# Patient Record
Sex: Female | Born: 1945 | Race: Black or African American | Hispanic: No | Smoking: Current every day smoker
Health system: Southern US, Community
[De-identification: ages and names within clinical notes are randomized; demographics above are authoritative.]

## PROBLEM LIST (undated history)

## (undated) DIAGNOSIS — E785 Hyperlipidemia, unspecified: Secondary | ICD-10-CM

## (undated) DIAGNOSIS — E669 Obesity, unspecified: Secondary | ICD-10-CM

## (undated) DIAGNOSIS — R0600 Dyspnea, unspecified: Secondary | ICD-10-CM

## (undated) DIAGNOSIS — Z91199 Patient's noncompliance with other medical treatment and regimen due to unspecified reason: Secondary | ICD-10-CM

## (undated) DIAGNOSIS — I509 Heart failure, unspecified: Secondary | ICD-10-CM

## (undated) DIAGNOSIS — J9610 Chronic respiratory failure, unspecified whether with hypoxia or hypercapnia: Secondary | ICD-10-CM

## (undated) DIAGNOSIS — Z8541 Personal history of malignant neoplasm of cervix uteri: Secondary | ICD-10-CM

## (undated) DIAGNOSIS — M7989 Other specified soft tissue disorders: Secondary | ICD-10-CM

## (undated) DIAGNOSIS — I10 Essential (primary) hypertension: Secondary | ICD-10-CM

## (undated) DIAGNOSIS — Z9119 Patient's noncompliance with other medical treatment and regimen: Secondary | ICD-10-CM

## (undated) DIAGNOSIS — J9859 Other diseases of mediastinum, not elsewhere classified: Secondary | ICD-10-CM

## (undated) DIAGNOSIS — J449 Chronic obstructive pulmonary disease, unspecified: Secondary | ICD-10-CM

## (undated) DIAGNOSIS — J302 Other seasonal allergic rhinitis: Secondary | ICD-10-CM

## (undated) DIAGNOSIS — F411 Generalized anxiety disorder: Secondary | ICD-10-CM

## (undated) DIAGNOSIS — Z720 Tobacco use: Secondary | ICD-10-CM

## (undated) DIAGNOSIS — Z789 Other specified health status: Secondary | ICD-10-CM

## (undated) DIAGNOSIS — I5032 Chronic diastolic (congestive) heart failure: Secondary | ICD-10-CM

## (undated) DIAGNOSIS — B019 Varicella without complication: Secondary | ICD-10-CM

## (undated) DIAGNOSIS — I499 Cardiac arrhythmia, unspecified: Secondary | ICD-10-CM

## (undated) DIAGNOSIS — I059 Rheumatic mitral valve disease, unspecified: Secondary | ICD-10-CM

## (undated) DIAGNOSIS — R911 Solitary pulmonary nodule: Secondary | ICD-10-CM

## (undated) HISTORY — DX: Other seasonal allergic rhinitis: J30.2

## (undated) HISTORY — DX: Chronic obstructive pulmonary disease, unspecified: J44.9

## (undated) HISTORY — DX: Solitary pulmonary nodule: R91.1

## (undated) HISTORY — DX: Chronic respiratory failure, unspecified whether with hypoxia or hypercapnia: J96.10

## (undated) HISTORY — DX: Obesity, unspecified: E66.9

## (undated) HISTORY — DX: Generalized anxiety disorder: F41.1

## (undated) HISTORY — DX: Chronic diastolic (congestive) heart failure: I50.32

## (undated) HISTORY — DX: Patient's noncompliance with other medical treatment and regimen: Z91.19

## (undated) HISTORY — PX: TOTAL ABDOMINAL HYSTERECTOMY: SHX209

## (undated) HISTORY — DX: Varicella without complication: B01.9

## (undated) HISTORY — DX: Personal history of malignant neoplasm of cervix uteri: Z85.41

## (undated) HISTORY — DX: Other specified soft tissue disorders: M79.89

## (undated) HISTORY — DX: Tobacco use: Z72.0

## (undated) HISTORY — DX: Rheumatic mitral valve disease, unspecified: I05.9

## (undated) HISTORY — DX: Essential (primary) hypertension: I10

## (undated) HISTORY — DX: Other diseases of mediastinum, not elsewhere classified: J98.59

## (undated) HISTORY — DX: Patient's noncompliance with other medical treatment and regimen due to unspecified reason: Z91.199

---

## 1980-01-22 DIAGNOSIS — Z8541 Personal history of malignant neoplasm of cervix uteri: Secondary | ICD-10-CM

## 1980-01-22 HISTORY — DX: Personal history of malignant neoplasm of cervix uteri: Z85.41

## 2006-03-06 ENCOUNTER — Ambulatory Visit: Payer: Self-pay | Admitting: Internal Medicine

## 2006-03-26 ENCOUNTER — Ambulatory Visit: Payer: Self-pay

## 2006-03-26 ENCOUNTER — Encounter: Payer: Self-pay | Admitting: Cardiology

## 2006-03-26 ENCOUNTER — Ambulatory Visit: Payer: Self-pay | Admitting: Critical Care Medicine

## 2006-04-09 ENCOUNTER — Ambulatory Visit: Payer: Self-pay | Admitting: Internal Medicine

## 2006-04-09 LAB — CONVERTED CEMR LAB
Albumin: 3.8 g/dL (ref 3.5–5.2)
Basophils Absolute: 0 10*3/uL (ref 0.0–0.1)
Basophils Relative: 0.9 % (ref 0.0–1.0)
Bilirubin Urine: NEGATIVE
CO2: 30 meq/L (ref 19–32)
Cholesterol: 177 mg/dL (ref 0–200)
Creatinine, Ser: 0.8 mg/dL (ref 0.4–1.2)
Crystals: NEGATIVE
Eosinophils Absolute: 0.1 10*3/uL (ref 0.0–0.6)
GFR calc Af Amer: 95 mL/min
HDL: 32.9 mg/dL — ABNORMAL LOW (ref >39.0)
Hemoglobin: 16.1 g/dL — ABNORMAL HIGH (ref 12.0–15.0)
Ketones, ur: NEGATIVE mg/dL
Leukocytes, UA: NEGATIVE
Lymphocytes Relative: 58.4 % — ABNORMAL HIGH (ref 12.0–46.0)
MCHC: 33.4 g/dL (ref 30.0–36.0)
MCV: 87.7 fL (ref 78.0–100.0)
Monocytes Absolute: 0.8 10*3/uL — ABNORMAL HIGH (ref 0.2–0.7)
Monocytes Relative: 18.7 % — ABNORMAL HIGH (ref 3.0–11.0)
Neutro Abs: 0.9 10*3/uL — ABNORMAL LOW (ref 1.4–7.7)
Nitrite: NEGATIVE
Platelets: 258 10*3/uL (ref 150–400)
Potassium: 4.2 meq/L (ref 3.5–5.1)
RBC / HPF: NONE SEEN
Sodium: 144 meq/L (ref 135–145)
Specific Gravity, Urine: 1.01 (ref 1.000–1.03)
Triglycerides: 189 mg/dL — ABNORMAL HIGH (ref 0–149)
Urine Glucose: NEGATIVE mg/dL
Urobilinogen, UA: 0.2 (ref 0.0–1.0)
VLDL: 38 mg/dL (ref 0–40)

## 2006-04-10 ENCOUNTER — Ambulatory Visit: Payer: Self-pay | Admitting: Internal Medicine

## 2006-05-19 ENCOUNTER — Ambulatory Visit: Payer: Self-pay | Admitting: Internal Medicine

## 2006-06-27 ENCOUNTER — Ambulatory Visit: Payer: Self-pay | Admitting: Internal Medicine

## 2006-07-11 ENCOUNTER — Ambulatory Visit: Payer: Self-pay | Admitting: Internal Medicine

## 2006-08-01 ENCOUNTER — Ambulatory Visit: Payer: Self-pay | Admitting: Internal Medicine

## 2006-08-01 LAB — CONVERTED CEMR LAB
BUN: 7 mg/dL (ref 6–23)
CO2: 27 meq/L (ref 19–32)
Calcium: 9.6 mg/dL (ref 8.4–10.5)
Creatinine, Ser: 0.9 mg/dL (ref 0.4–1.2)
Glucose, Bld: 86 mg/dL (ref 70–99)

## 2007-02-20 ENCOUNTER — Ambulatory Visit: Payer: Self-pay | Admitting: Internal Medicine

## 2007-02-20 DIAGNOSIS — Z8541 Personal history of malignant neoplasm of cervix uteri: Secondary | ICD-10-CM

## 2007-02-20 DIAGNOSIS — F419 Anxiety disorder, unspecified: Secondary | ICD-10-CM | POA: Insufficient documentation

## 2007-02-20 DIAGNOSIS — I059 Rheumatic mitral valve disease, unspecified: Secondary | ICD-10-CM | POA: Insufficient documentation

## 2007-03-06 ENCOUNTER — Ambulatory Visit: Payer: Self-pay | Admitting: Internal Medicine

## 2007-03-06 DIAGNOSIS — I10 Essential (primary) hypertension: Secondary | ICD-10-CM

## 2007-04-17 ENCOUNTER — Telehealth (INDEPENDENT_AMBULATORY_CARE_PROVIDER_SITE_OTHER): Payer: Self-pay | Admitting: *Deleted

## 2007-05-14 ENCOUNTER — Telehealth: Payer: Self-pay | Admitting: Internal Medicine

## 2007-05-18 ENCOUNTER — Ambulatory Visit: Payer: Self-pay | Admitting: Internal Medicine

## 2007-05-18 LAB — CONVERTED CEMR LAB

## 2007-05-19 ENCOUNTER — Encounter: Payer: Self-pay | Admitting: Internal Medicine

## 2008-06-08 ENCOUNTER — Telehealth: Payer: Self-pay | Admitting: Internal Medicine

## 2008-06-10 ENCOUNTER — Ambulatory Visit: Payer: Self-pay | Admitting: Internal Medicine

## 2008-06-10 LAB — CONVERTED CEMR LAB
ALT: 16 units/L (ref 0–35)
AST: 15 units/L (ref 0–37)
Alkaline Phosphatase: 78 units/L (ref 39–117)
Basophils Absolute: 0 10*3/uL (ref 0.0–0.1)
Bilirubin, Direct: 0.1 mg/dL (ref 0.0–0.3)
CO2: 31 meq/L (ref 19–32)
Chloride: 111 meq/L (ref 96–112)
Eosinophils Relative: 2 % (ref 0.0–5.0)
HDL: 32 mg/dL — ABNORMAL LOW (ref >39.00)
Hemoglobin: 16.5 g/dL — ABNORMAL HIGH (ref 12.0–15.0)
Lymphocytes Relative: 45.6 % (ref 12.0–46.0)
Monocytes Relative: 8.7 % (ref 3.0–12.0)
Neutro Abs: 2.4 10*3/uL (ref 1.4–7.7)
RBC: 5.42 M/uL — ABNORMAL HIGH (ref 3.87–5.11)
RDW: 13.4 % (ref 11.5–14.6)
Sodium: 143 meq/L (ref 135–145)
TSH: 2.46 microintl units/mL (ref 0.35–5.50)
Total CHOL/HDL Ratio: 6
Total Protein: 7.2 g/dL (ref 6.0–8.3)
WBC: 5.5 10*3/uL (ref 4.5–10.5)

## 2008-06-13 ENCOUNTER — Ambulatory Visit: Payer: Self-pay | Admitting: Internal Medicine

## 2008-06-13 DIAGNOSIS — E785 Hyperlipidemia, unspecified: Secondary | ICD-10-CM

## 2008-06-22 ENCOUNTER — Ambulatory Visit: Payer: Self-pay | Admitting: Internal Medicine

## 2008-08-24 ENCOUNTER — Telehealth: Payer: Self-pay | Admitting: Internal Medicine

## 2009-01-08 ENCOUNTER — Emergency Department (HOSPITAL_COMMUNITY): Admission: EM | Admit: 2009-01-08 | Discharge: 2009-01-08 | Payer: Self-pay | Admitting: Emergency Medicine

## 2010-03-22 DIAGNOSIS — J479 Bronchiectasis, uncomplicated: Secondary | ICD-10-CM | POA: Insufficient documentation

## 2010-03-22 DIAGNOSIS — R911 Solitary pulmonary nodule: Secondary | ICD-10-CM | POA: Insufficient documentation

## 2010-03-22 DIAGNOSIS — J9859 Other diseases of mediastinum, not elsewhere classified: Secondary | ICD-10-CM

## 2010-03-22 HISTORY — DX: Other diseases of mediastinum, not elsewhere classified: J98.59

## 2010-03-22 HISTORY — DX: Solitary pulmonary nodule: R91.1

## 2010-04-04 ENCOUNTER — Inpatient Hospital Stay (HOSPITAL_COMMUNITY)
Admission: EM | Admit: 2010-04-04 | Discharge: 2010-04-11 | DRG: 189 | Disposition: A | Discharge status: Home Health | Payer: Medicaid Other | Attending: Internal Medicine | Admitting: Internal Medicine

## 2010-04-04 ENCOUNTER — Emergency Department (HOSPITAL_COMMUNITY): Payer: Medicaid Other

## 2010-04-04 DIAGNOSIS — I5032 Chronic diastolic (congestive) heart failure: Secondary | ICD-10-CM | POA: Diagnosis present

## 2010-04-04 DIAGNOSIS — J9620 Acute and chronic respiratory failure, unspecified whether with hypoxia or hypercapnia: Principal | ICD-10-CM | POA: Diagnosis present

## 2010-04-04 DIAGNOSIS — E669 Obesity, unspecified: Secondary | ICD-10-CM | POA: Diagnosis present

## 2010-04-04 DIAGNOSIS — Z9119 Patient's noncompliance with other medical treatment and regimen: Secondary | ICD-10-CM

## 2010-04-04 DIAGNOSIS — Z91199 Patient's noncompliance with other medical treatment and regimen due to unspecified reason: Secondary | ICD-10-CM

## 2010-04-04 DIAGNOSIS — Z23 Encounter for immunization: Secondary | ICD-10-CM

## 2010-04-04 DIAGNOSIS — I498 Other specified cardiac arrhythmias: Secondary | ICD-10-CM | POA: Diagnosis present

## 2010-04-04 DIAGNOSIS — G4733 Obstructive sleep apnea (adult) (pediatric): Secondary | ICD-10-CM | POA: Diagnosis present

## 2010-04-04 DIAGNOSIS — T380X5A Adverse effect of glucocorticoids and synthetic analogues, initial encounter: Secondary | ICD-10-CM | POA: Diagnosis not present

## 2010-04-04 DIAGNOSIS — J440 Chronic obstructive pulmonary disease with acute lower respiratory infection: Secondary | ICD-10-CM | POA: Diagnosis present

## 2010-04-04 DIAGNOSIS — I10 Essential (primary) hypertension: Secondary | ICD-10-CM | POA: Diagnosis present

## 2010-04-04 DIAGNOSIS — I509 Heart failure, unspecified: Secondary | ICD-10-CM | POA: Diagnosis present

## 2010-04-04 DIAGNOSIS — J209 Acute bronchitis, unspecified: Secondary | ICD-10-CM | POA: Diagnosis present

## 2010-04-04 DIAGNOSIS — R7309 Other abnormal glucose: Secondary | ICD-10-CM | POA: Diagnosis present

## 2010-04-04 DIAGNOSIS — F17200 Nicotine dependence, unspecified, uncomplicated: Secondary | ICD-10-CM | POA: Diagnosis present

## 2010-04-04 DIAGNOSIS — R918 Other nonspecific abnormal finding of lung field: Secondary | ICD-10-CM | POA: Diagnosis present

## 2010-04-04 LAB — COMPREHENSIVE METABOLIC PANEL
Alkaline Phosphatase: 90 U/L (ref 39–117)
BUN: 13 mg/dL (ref 6–23)
CO2: 26 mEq/L (ref 19–32)
Chloride: 105 mEq/L (ref 96–112)
Glucose, Bld: 137 mg/dL — ABNORMAL HIGH (ref 70–99)
Potassium: 3.4 mEq/L — ABNORMAL LOW (ref 3.5–5.1)
Total Bilirubin: 0.7 mg/dL (ref 0.3–1.2)

## 2010-04-04 LAB — CBC
HCT: 54.2 % — ABNORMAL HIGH (ref 36.0–46.0)
MCV: 90.9 fL (ref 78.0–100.0)
RBC: 5.96 MIL/uL — ABNORMAL HIGH (ref 3.87–5.11)
WBC: 8.2 10*3/uL (ref 4.0–10.5)

## 2010-04-04 LAB — POCT I-STAT 3, ART BLOOD GAS (G3+)
O2 Saturation: 94 %
pCO2 arterial: 46 mmHg — ABNORMAL HIGH (ref 35.0–45.0)
pO2, Arterial: 77 mmHg — ABNORMAL LOW (ref 80.0–100.0)

## 2010-04-04 LAB — DIFFERENTIAL
Eosinophils Relative: 1 % (ref 0–5)
Lymphocytes Relative: 23 % (ref 12–46)
Lymphs Abs: 1.9 10*3/uL (ref 0.7–4.0)
Neutrophils Relative %: 67 % (ref 43–77)

## 2010-04-04 LAB — BRAIN NATRIURETIC PEPTIDE: Pro B Natriuretic peptide (BNP): <30 pg/mL (ref 0.0–100.0)

## 2010-04-04 LAB — POCT CARDIAC MARKERS

## 2010-04-05 LAB — TSH: TSH: 1.179 u[IU]/mL (ref 0.350–4.500)

## 2010-04-05 LAB — COMPREHENSIVE METABOLIC PANEL
ALT: 23 U/L (ref 0–35)
BUN: 14 mg/dL (ref 6–23)
Calcium: 9.4 mg/dL (ref 8.4–10.5)
Creatinine, Ser: 1.29 mg/dL — ABNORMAL HIGH (ref 0.4–1.2)
GFR calc non Af Amer: 42 mL/min — ABNORMAL LOW (ref >60)
Glucose, Bld: 141 mg/dL — ABNORMAL HIGH (ref 70–99)
Sodium: 142 mEq/L (ref 135–145)
Total Protein: 7.2 g/dL (ref 6.0–8.3)

## 2010-04-05 LAB — URINALYSIS, ROUTINE W REFLEX MICROSCOPIC
Bilirubin Urine: NEGATIVE
Glucose, UA: NEGATIVE mg/dL
Ketones, ur: NEGATIVE mg/dL
Leukocytes, UA: NEGATIVE
pH: 6 (ref 5.0–8.0)

## 2010-04-05 LAB — GLUCOSE, CAPILLARY: Glucose-Capillary: 195 mg/dL — ABNORMAL HIGH (ref 70–99)

## 2010-04-05 LAB — CARDIAC PANEL(CRET KIN+CKTOT+MB+TROPI)
CK, MB: 8.6 ng/mL (ref 0.3–4.0)
CK, MB: 11.2 ng/mL (ref 0.3–4.0)
Relative Index: 2 (ref 0.0–2.5)
Relative Index: 3.3 — ABNORMAL HIGH (ref 0.0–2.5)
Troponin I: 0.02 ng/mL (ref 0.00–0.06)
Troponin I: 0.02 ng/mL (ref 0.00–0.06)

## 2010-04-05 LAB — CBC
MCHC: 33.3 g/dL (ref 30.0–36.0)
MCV: 91 fL (ref 78.0–100.0)
Platelets: 250 10*3/uL (ref 150–400)
RDW: 14.5 % (ref 11.5–15.5)
WBC: 8.5 10*3/uL (ref 4.0–10.5)

## 2010-04-05 LAB — URINE MICROSCOPIC-ADD ON

## 2010-04-05 LAB — LIPID PANEL
Cholesterol: 170 mg/dL (ref 0–200)
HDL: 38 mg/dL — ABNORMAL LOW (ref >39)
LDL Cholesterol: 121 mg/dL — ABNORMAL HIGH (ref 0–99)
Total CHOL/HDL Ratio: 4.5 RATIO
Triglycerides: 55 mg/dL (ref <150)

## 2010-04-05 LAB — BLOOD GAS, ARTERIAL
Acid-Base Excess: 1.6 mmol/L (ref 0.0–2.0)
Bicarbonate: 26.4 mEq/L — ABNORMAL HIGH (ref 20.0–24.0)
Drawn by: 307971
O2 Content: 5 L/min
O2 Saturation: 91.3 %
Patient temperature: 98.6
pO2, Arterial: 62.2 mmHg — ABNORMAL LOW (ref 80.0–100.0)

## 2010-04-05 MED ORDER — IOHEXOL 300 MG/ML  SOLN
100.0000 mL | Freq: Once | INTRAMUSCULAR | Status: AC | PRN
  Administered 2010-04-05: 100 mL via INTRAVENOUS

## 2010-04-06 DIAGNOSIS — R0609 Other forms of dyspnea: Secondary | ICD-10-CM

## 2010-04-06 DIAGNOSIS — R0989 Other specified symptoms and signs involving the circulatory and respiratory systems: Secondary | ICD-10-CM

## 2010-04-06 LAB — CBC
HCT: 52.4 % — ABNORMAL HIGH (ref 36.0–46.0)
Hemoglobin: 17.3 g/dL — ABNORMAL HIGH (ref 12.0–15.0)
MCH: 29.9 pg (ref 26.0–34.0)
MCHC: 33 g/dL (ref 30.0–36.0)
MCV: 90.5 fL (ref 78.0–100.0)
RBC: 5.79 MIL/uL — ABNORMAL HIGH (ref 3.87–5.11)

## 2010-04-06 LAB — DIFFERENTIAL
Lymphs Abs: 0.8 10*3/uL (ref 0.7–4.0)
Monocytes Absolute: 0.8 10*3/uL (ref 0.1–1.0)
Monocytes Relative: 6 % (ref 3–12)
Neutro Abs: 11.9 10*3/uL — ABNORMAL HIGH (ref 1.7–7.7)
Neutrophils Relative %: 88 % — ABNORMAL HIGH (ref 43–77)

## 2010-04-06 LAB — CARDIAC PANEL(CRET KIN+CKTOT+MB+TROPI)
CK, MB: 11.3 ng/mL (ref 0.3–4.0)
Total CK: 376 U/L — ABNORMAL HIGH (ref 7–177)
Troponin I: 0.03 ng/mL (ref 0.00–0.06)

## 2010-04-06 LAB — COMPREHENSIVE METABOLIC PANEL
AST: 25 U/L (ref 0–37)
BUN: 22 mg/dL (ref 6–23)
CO2: 24 mEq/L (ref 19–32)
Chloride: 104 mEq/L (ref 96–112)
Creatinine, Ser: 1.2 mg/dL (ref 0.4–1.2)
GFR calc non Af Amer: 46 mL/min — ABNORMAL LOW (ref >60)
Glucose, Bld: 156 mg/dL — ABNORMAL HIGH (ref 70–99)
Total Bilirubin: 0.4 mg/dL (ref 0.3–1.2)

## 2010-04-06 LAB — GLUCOSE, CAPILLARY: Glucose-Capillary: 155 mg/dL — ABNORMAL HIGH (ref 70–99)

## 2010-04-07 LAB — COMPREHENSIVE METABOLIC PANEL
Alkaline Phosphatase: 73 U/L (ref 39–117)
BUN: 24 mg/dL — ABNORMAL HIGH (ref 6–23)
Chloride: 104 mEq/L (ref 96–112)
Glucose, Bld: 91 mg/dL (ref 70–99)
Potassium: 4 mEq/L (ref 3.5–5.1)
Total Bilirubin: 0.7 mg/dL (ref 0.3–1.2)

## 2010-04-07 LAB — GLUCOSE, CAPILLARY: Glucose-Capillary: 126 mg/dL — ABNORMAL HIGH (ref 70–99)

## 2010-04-07 LAB — CBC
HCT: 54.9 % — ABNORMAL HIGH (ref 36.0–46.0)
MCV: 92.3 fL (ref 78.0–100.0)
RDW: 14.7 % (ref 11.5–15.5)
WBC: 13.8 10*3/uL — ABNORMAL HIGH (ref 4.0–10.5)

## 2010-04-07 LAB — DIFFERENTIAL
Eosinophils Relative: 0 % (ref 0–5)
Lymphocytes Relative: 15 % (ref 12–46)
Lymphs Abs: 2.1 10*3/uL (ref 0.7–4.0)
Monocytes Relative: 13 % — ABNORMAL HIGH (ref 3–12)

## 2010-04-08 LAB — COMPREHENSIVE METABOLIC PANEL
Albumin: 3.7 g/dL (ref 3.5–5.2)
Alkaline Phosphatase: 74 U/L (ref 39–117)
BUN: 22 mg/dL (ref 6–23)
Chloride: 105 mEq/L (ref 96–112)
Glucose, Bld: 153 mg/dL — ABNORMAL HIGH (ref 70–99)
Potassium: 4.1 mEq/L (ref 3.5–5.1)
Total Bilirubin: 0.3 mg/dL (ref 0.3–1.2)

## 2010-04-08 LAB — GLUCOSE, CAPILLARY
Glucose-Capillary: 160 mg/dL — ABNORMAL HIGH (ref 70–99)
Glucose-Capillary: 159 mg/dL — ABNORMAL HIGH (ref 70–99)

## 2010-04-08 LAB — CBC
HCT: 55.2 % — ABNORMAL HIGH (ref 36.0–46.0)
MCV: 93.2 fL (ref 78.0–100.0)
RBC: 5.92 MIL/uL — ABNORMAL HIGH (ref 3.87–5.11)
WBC: 11.3 10*3/uL — ABNORMAL HIGH (ref 4.0–10.5)

## 2010-04-09 ENCOUNTER — Inpatient Hospital Stay (HOSPITAL_COMMUNITY): Payer: Medicaid Other

## 2010-04-09 DIAGNOSIS — R911 Solitary pulmonary nodule: Secondary | ICD-10-CM

## 2010-04-09 DIAGNOSIS — R0902 Hypoxemia: Secondary | ICD-10-CM

## 2010-04-09 DIAGNOSIS — J479 Bronchiectasis, uncomplicated: Secondary | ICD-10-CM

## 2010-04-09 DIAGNOSIS — J441 Chronic obstructive pulmonary disease with (acute) exacerbation: Secondary | ICD-10-CM

## 2010-04-09 LAB — CBC
HCT: 56.4 % — ABNORMAL HIGH (ref 36.0–46.0)
Platelets: 286 10*3/uL (ref 150–400)
RBC: 6.05 MIL/uL — ABNORMAL HIGH (ref 3.87–5.11)
RDW: 14.5 % (ref 11.5–15.5)
WBC: 10.9 10*3/uL — ABNORMAL HIGH (ref 4.0–10.5)

## 2010-04-09 LAB — BLOOD GAS, ARTERIAL
Bicarbonate: 30.2 mEq/L — ABNORMAL HIGH (ref 20.0–24.0)
O2 Saturation: 88.7 %
Patient temperature: 98.6
TCO2: 31.7 mmol/L (ref 0–100)

## 2010-04-09 LAB — DIFFERENTIAL
Basophils Absolute: 0 10*3/uL (ref 0.0–0.1)
Eosinophils Absolute: 0 10*3/uL (ref 0.0–0.7)
Eosinophils Relative: 0 % (ref 0–5)
Lymphocytes Relative: 8 % — ABNORMAL LOW (ref 12–46)
Lymphs Abs: 0.9 10*3/uL (ref 0.7–4.0)
Neutrophils Relative %: 87 % — ABNORMAL HIGH (ref 43–77)

## 2010-04-09 LAB — COMPREHENSIVE METABOLIC PANEL
AST: 16 U/L (ref 0–37)
CO2: 32 mEq/L (ref 19–32)
Calcium: 9.1 mg/dL (ref 8.4–10.5)
Chloride: 105 mEq/L (ref 96–112)
Creatinine, Ser: 0.93 mg/dL (ref 0.4–1.2)
GFR calc Af Amer: >60 mL/min (ref >60)
GFR calc non Af Amer: >60 mL/min (ref >60)
Glucose, Bld: 144 mg/dL — ABNORMAL HIGH (ref 70–99)
Total Bilirubin: 0.4 mg/dL (ref 0.3–1.2)

## 2010-04-09 LAB — GLUCOSE, CAPILLARY: Glucose-Capillary: 166 mg/dL — ABNORMAL HIGH (ref 70–99)

## 2010-04-10 ENCOUNTER — Inpatient Hospital Stay (HOSPITAL_COMMUNITY): Payer: Medicaid Other

## 2010-04-10 LAB — COMPREHENSIVE METABOLIC PANEL
ALT: 24 U/L (ref 0–35)
AST: 21 U/L (ref 0–37)
Alkaline Phosphatase: 62 U/L (ref 39–117)
CO2: 31 mEq/L (ref 19–32)
Calcium: 8.9 mg/dL (ref 8.4–10.5)
Chloride: 101 mEq/L (ref 96–112)
GFR calc Af Amer: >60 mL/min (ref >60)
GFR calc non Af Amer: >60 mL/min (ref >60)
Potassium: 4.6 mEq/L (ref 3.5–5.1)
Sodium: 138 mEq/L (ref 135–145)

## 2010-04-10 LAB — CBC
Hemoglobin: 18.4 g/dL — ABNORMAL HIGH (ref 12.0–15.0)
MCHC: 33.2 g/dL (ref 30.0–36.0)
RBC: 6.03 MIL/uL — ABNORMAL HIGH (ref 3.87–5.11)
WBC: 12.8 10*3/uL — ABNORMAL HIGH (ref 4.0–10.5)

## 2010-04-10 LAB — GLUCOSE, CAPILLARY: Glucose-Capillary: 158 mg/dL — ABNORMAL HIGH (ref 70–99)

## 2010-04-10 NOTE — Progress Notes (Signed)
[name], [name]                ACCOUNT NO.:  [id number]  MEDICAL RECORD NO.:  [id number]           PATIENT TYPE:  I  LOCATION:  4741                         FACILITY:  [facility]  PHYSICIAN:  Pincus Large, MD     DATE OF BIRTH:  10/07/1945                                PROGRESS NOTE   PRIMARY CARE PHYSICIAN: Unassigned, he used to see Dr. Artist Pais.  ACTIVE PROBLEMS DURING HOSPITAL: 1. Chronic obstructive pulmonary disease exacerbation requiring 4     liters of oxygen. 2. Mild bronchiectasis and scarring in the right upper lung. 3. Three small pulmonary nodules within the right upper and the lower     lung bases measuring up to 5 mm in size. 4. History of tobacco abuse. 5. Grade 1 diastolic dysfunction.  IMAGES THAT WERE DONE: 1. Chest x-ray was done on April 04, 2010, which shows mild pulmonary     vascular congestion superimposed with chronic interstitial     coarsening, no focal airspace disease. 2. CT angio was done on April 05, 2010, which shows no definite     evidence of significant pulmonary embolism, minimal bibasilar     atelectasis, emphysema within the upper lung, mild bronchiectasis     and scarring of the right upper lung lobes.  There is a single     enlarged 1.3-cm on the right peribronchial node, no significant     mediastinal lymphadenopathy seen, three small pulmonary nodule     noted within the right upper and the lower lung lobe measuring up     to 5 mm in size.  MEDICATIONS IN THE HOSPITAL: 1. Aspirin 81 mg daily. 2. Pulmicort b.i.d. 3. Lovenox 40 daily. 4. Flonase daily. 5. Lasix 20 daily. 6. Mucinex 600 mg p.o. b.i.d. 7. Xopenex 1.25 q.i.d. 8. Loratadine 10 daily. 9. Solu-Medrol 60 IV q.12 h. 10.Avelox 400 mg p.o. daily. 11.Protonix 40 daily. 12.Xanax 0.25 q.6 h. p.r.n. 13.Trazodone 50 nightly p.r.n. 14.Spiriva 18 mcg daily.  BRIEF HOSPITAL COURSE: She is a 65 year old female with known history of COPD, hypertension, not on any medication due  to hypertension problem, came to the hospital on April 04, 2010, having shortness of breath.  She denies any fever or chills.  The patient came into the emergency room, she was placed on BiPAP.  The patient was given some Solu-Medrol steroid and Lasix.  After that, the patient's shortness of breath did improve.  She was admitted for COPD exacerbation.  PHYSICAL EXAMINATION: VITAL SIGNS:  Her vital signs today, temperature is 98, pulse is 107, respirations 22, blood pressure is 157/93. GENERAL:  She is awake, oriented x3, lying in bed with mild distress. HEENT:  Normocephalic and atraumatic. LUNGS:  Bilateral wheezes, expiratory. CARDIAC:  S1 and S2 tachycardia.  No murmur, gallops, or rubs. ABDOMEN:  Obese. Bowel sounds present.  Guaiac were negative. NEUROLOGIC:  Awake, oriented x3.  Motor 5/5 in four extremities.  HOSPITAL COURSE BY PROBLEM: 1. Acute on top of chronic respiratory failure with COPD exacerbation     component.  The patient did improve since day 1.  She was on IV  Solu-Medrol, I tried to taper down the Solu-Medrol from 60 q.8 h.     to 60 b.i.d. until mildly wheezes.  She is also on inhaled steroids     and a bronchodilator.  I will add Spiriva today given the fact that     she is slowly improving.  At this point, it was made very clear to     the patient that her choice to smoke or not to smoke is really to     make or break issue here, not a choice.  I think the patient needs     to see Pulmonology as an outpatient since she used to see     Pulmonology [facility], Dr. Sandrea Hughs. 2. Possible acute bronchitis.  The patient was started on IV Avelox.     She is afebrile.  I will also switch it to p.o., maybe we can     discontinue that in four more days. 3. Grade 1 diastolic dysfunction.  The patient had an echocardiogram     in 2008 which shows her EF was 60-65% with grade I diastolic     dysfunction.  The patient is started on Lasix for now.  She is      waiting for the echocardiogram this admission. 4. Pulmonary nodules.  The patient has three small pulmonary nodules     within the right upper and the lower lung bases.  The patient needs     to follow up with CT in 6-12 months as recommended. 5. History of tobacco abuse and smoking.  Counsel was done at the     bedside. 6. History of hypertension.  Not on any medication.  I will start the     patient on Norvasc 10 daily for now/hydrochlorothiazide. 7. Disposition will be done at the time of discharging the patient and     the patient also will require home oxygen since her ambulatory     pulse ox goes down to 70 during ambulation, so we will get the case     manager consult for home oxygen.          ______________________________ Pincus Large, MD     SA/MEDQ  D:  04/09/2010  T:  04/10/2010  Job:  [id number]  Electronically Signed by Pincus Large MD on 04/10/2010 03:58:46 PM

## 2010-04-11 LAB — GLUCOSE, CAPILLARY
Glucose-Capillary: 126 mg/dL — ABNORMAL HIGH (ref 70–99)
Glucose-Capillary: 127 mg/dL — ABNORMAL HIGH (ref 70–99)

## 2010-04-12 NOTE — Discharge Summary (Signed)
NAMEJANMARIE, [name]                ACCOUNT NO.:  [id number]  MEDICAL RECORD NO.:  [id number]           PATIENT TYPE:  I  LOCATION:  4741                         FACILITY:  [facility]  PHYSICIAN:  Andreas Blower, MD       DATE OF BIRTH:  [birth year]  DATE OF ADMISSION:  04/04/2010 DATE OF DISCHARGE:  04/11/2010                              DISCHARGE SUMMARY   PRIMARY CARE PHYSICIAN:  [facility].  PULMONOLOGIST:  Dr. Marchelle Gearing.  DISCHARGE DIAGNOSES: 1. Chronic obstructive pulmonary disease exacerbation, requiring home     O2. 2. Mild bronchiectasis and scarring of the right upper lobe. 3. Three small pulmonary nodules within right upper and lower lung     bases measuring 5 mm in size. 4. History of tobacco use. 5. Hypertension. 6. Grade 1 diastolic dysfunction. 7. Possible obstructive sleep apnea. 8. Obesity. 9. Hyperglycemia due to steroid use.  DISCHARGE MEDICATIONS: 1. Alprazolam 0.25 mg p.o. twice daily as needed for anxiety. 2. Amlodipine 10 mg p.o. daily. 3. Aspirin 81 mg p.o. daily. 4. Pulmicort 0.25 mg p.o. twice daily. 5. Clonidine 0.1 mg every 12 hours. 6. Furosemide 20 mg p.o. daily. 7. Glipizide 2.5 mg p.o. daily to be continued while on steroids. 8. Guaifenesin/DM 100/100 mg per 5 mL every 4 hours as needed for     cough. 9. Ipratropium bromide 1/2 mg every 6 hours as needed for shortness of     breath. 10.Levalbuterol 1.25 mg every 6 hours as needed for shortness of     breath. 11.Loratadine 10 mg p.o. daily. 12.Moxifloxacin 400 mg p.o. daily for 3 more days. 13.Omeprazole 20 mg p.o. daily. 14.Prednisone 40 mg p.o. daily x1, then 20 mg p.o. daily x3 days, then     10 mg p.o. daily for 3 days, and 5 mg p.o. daily for 3 days, and     then discontinue. 15.Ibuprofen 400 mg every 6 hours as needed for pain. 16.Primatene Mist spray 1-2 puffs as needed for shortness of breath. 17.Tylenol PM over-the-counter 2 tablets daily at bedtime as needed     for  sleep.  BRIEF ADMITTING HISTORY AND PHYSICAL:  [name] is a 65 year old female with history of COPD, hypertension, tobacco use, who presented to the ER with complaints about shortness of breath.  RADIOLOGY/IMAGING:  The patient had a portable chest x-ray on April 04, 2010, which showed mild pulmonary vascular congestion, superimposed on chronic interstitial coarsening.  No focal air space disease.  The patient had CT angiogram of the chest on April 05, 2010, which shows no definite evidence of significant pulmonary embolus.  Minimal bibasilar atelectasis is noted.  Emphysema within the upper lobes.  Mild bronchiectasis and scarring in the right upper lobe.  Single and large 1.3-cm peribronchial node noted.  No significant mediastinal lymph node to be seen.  Three small pulmonary nodules noted within the right upper and lower lobes measuring up to 5 mm in size.  The patient had chest x-ray, two-view, on April 10, 2010, which showed cardiomegaly and COPD, no acute findings.  DISPOSITION AND FOLLOWUP:  The patient to follow up with [facility], Dr. Daphine Deutscher  on May 11, 2010, at 9 a.m.  The patient to follow up with Dr. Marchelle Gearing with Pulmonary on April 24, 2010, at 1545.  The patient has [facility] eligibility appointment on May 03, 2010, at 230 p.m.  The patient will need a repeat chest CT in 6-12 months for her pulmonary nodules.  HOSPITAL COURSE:  Please refer to progress note dictated by Dr. Boris Sharper on April 10, 2010, for more details.  BRIEF HOSPITAL COURSE: 1. Acute on chronic respiratory failure with COPD exacerbation.     Initially, the patient was started on IV Solu-Medrol which she was     transitioned to p.o. steroids.  The patient was also started on     moxifloxacin.  She will be on moxifloxacin for 3 more days to     complete a 10-day course of antibiotics.  Pulmonary also evaluated     the patient and will be seen in Dr. Jane Canary clinic as an      outpatient. 2. Possible acute bronchitis.  Again, the patient is on moxifloxacin     which was transitioned to p.o. and she will continue for 3 more     days to complete a 10-day course of antibiotics. 3. Grade 1 diastolic dysfunction.  The patient had a 2-D     echocardiogram in 2008, which showed her ejection fraction was 60%     to 65% with grade 1 diastolic dysfunction.  The patient was started     on Lasix which she will continue as an outpatient at the time of     discharge. 4. Pulmonary nodules.  The patient has frequent small pulmonary     nodules as a result the patient will need an outpatient CT in 6-12     months. 5. Tobacco use and smoking.  The patient was counseled on smoking     cessation. 6. Hypertension.  Started on clonidine and amlodipine with good blood     pressure management at the time of discharge. 7. Hyperglycemia. Likely due to steroids. Started on Glipizide while     on steroids.  Time spent on discharge, talking to the patient, talking to consultants, and coordinating care was 40 minutes.     Andreas Blower, MD     SR/MEDQ  D:  04/11/2010  T:  04/12/2010  Job:  [id number]  Electronically Signed by Wardell Heath Ivry Pigue  on 04/12/2010 03:24:10 PM

## 2010-04-13 ENCOUNTER — Telehealth: Payer: Self-pay | Admitting: Internal Medicine

## 2010-04-13 NOTE — Telephone Encounter (Signed)
Spoke with pt and she states that she cannot afford neb medications. I advised the pt I could leave some samples at the front of the Pulmicort for her and we could discuss cheaper options at her HFU appointment. Pt states understanding. Samples left at front. Carron Curie, CMA

## 2010-04-13 NOTE — Telephone Encounter (Signed)
[id number]

## 2010-04-13 NOTE — Telephone Encounter (Signed)
Pt returned call from nurse

## 2010-04-13 NOTE — Telephone Encounter (Signed)
Call pt at [phone] (she returned call to give this #)

## 2010-04-13 NOTE — Telephone Encounter (Signed)
LMOMRCB.

## 2010-04-15 NOTE — Consult Note (Signed)
[name], [name]                ACCOUNT NO.:  [id number]  MEDICAL RECORD NO.:  [id number]           PATIENT TYPE:  LOCATION:                                 FACILITY:  PHYSICIAN:  Kalman Shan, MD   DATE OF BIRTH:  06-24-1945  DATE OF CONSULTATION: DATE OF DISCHARGE:                                CONSULTATION   Consult requested by [facility], Dr. Granville Lewis.  REASON FOR CONSULTATION:  Hypoxemia, pulmonary nodules, mediastinal node, COPD exacerbation.  HISTORY OF PRESENT ILLNESS:  A 65 year old obese African American female who used to be seen by Dr. Sherene Sires 4 years ago for COPD and tobacco abuse, FEV-1 in 2008 was 63% with a diffusion capacity of 33%, and established noncompliance and inability to afford medications.  She was admitted on April 05, 2010, with some symptoms of COPD exacerbation that included cough, productive sputum, subjective feeling of fever and chills, and shortness of breath, initially required BiPAP treatment and also Lasix and IV Solu-Medrol and nebulizers and antibiotic Avelox.  Since admission, she has progressively improved and feels "100% better;" however, she continues to remain hypoxemic requiring 3 L nasal cannula and desaturating to 70% even on exertion walking 10-20 feet even on the 3 L nasal cannula.  A CT scan of the chest on April 05, 2010, ruled out pulmonary embolism but confirmed the presence of emphysema and mild bronchiectasis especially in the right upper lobe along with a single enlarged 1.3-cm right peribronchial node without presence of other lymphadenopathy and small pulmonary nodules 5 mm in size in the right upper and right lower lobe.  Due to her persistent hypoxemia and the presence of these small pulmonary nodules and the one right peribronchial node, pulmonary consultation has been sought.  She tells me that at baseline she could do groceries and function do her activities of daily living without limitations other than  having to stop and rest periodically and she has chronic insidious onset dyspnea on exertion, relieved by rest, that has been stable for several years. However, as of May 2008 she did tell Dr. Sherene Sires that she would get dyspneic walking across the parking lot, so history is a little bit variable.  She admitted to chronic "smoker's cough" with mild amount of white sputum.  Denied any exertional chest pain, nausea, vomiting, radiating chest pain, syncope at baseline.  PAST MEDICAL HISTORY: 1. COPD as stated above. 2. Hypertension. 3. Medication noncompliance. 4. Difficulty affording medications.  PAST SURGICAL HISTORY:  Hysterectomy.  MEDICATIONS PRIOR TO ADMISSION:  Noncompliant.  ALLERGIES:  No known drug allergies.  FAMILY HISTORY:  Significant for esophageal cancer in her mom.  Second husband died of sarcoidosis.  SOCIAL HISTORY:  Divorced.  Smokes one pack a day since age 5.  Denies any alcohol or drug abuse.  Medication noncompliance.  Job occupation unknown.  REVIEW OF SYSTEMS:  This is as per HPI and past history.  In addition, she snores and complains of excessive daytime somnolence and has obesity.  Otherwise, 13-point review of systems is negative.  PHYSICAL EXAMINATION:  VITAL SIGNS:  Temperature 98.0, pulse of 107, respiratory rate of  22, blood pressure 169/93, saturation 88% on three 3 L nasal cannula. GENERAL:  Obese, comfortable. PSYCHIATRIC:  Pleasant, not anxious.  Mood is normal. CENTRAL NERVOUS SYSTEM:  Alert and oriented x3.  Glasgow Coma Scale 15. Speech normal.  Moves all four extremities normal. HEENT:  Mallampati airway class IV.  Pupils equal and reactive to light. NECK:  Short neck. CHEST:  Normal other than obesity. RESPIRATORY:  Trachea is central.  Air entry equal, diffusely decreased but no wheezes.  No distress.  No cyanosis.  No use of accessory muscles. CARDIOVASCULAR:  Regular rate and rhythm.  Mildly tachycardic. ABDOMEN:  Obese, soft,  nontender.  No organomegaly. EXTREMITIES:  No cyanosis.  No clubbing.  No edema. SKIN:  Intact.  LABORATORY VALUES: 1. CT angiogram chest as described in the history of present illness     that includes negative evaluation for PE, right upper lobe     bronchiectasis, less than 5-mm pulmonary nodule right upper lobe     and right lower lobe, and 1.3-cm right peribronchial note 2. CBC today, white count 10, hemoglobin 18, platelet count 280,000.  Chemistries show creatinine 0.9, bicarb of 32.  Thyroid function studies, TSH on April 05, 2010, is normal at 1.17.  BNP less than 30 on April 04, 2010.  ABG on April 04, 2010, pH 7.33, pCO2 46, pO2 77.  ASSESSMENT AND PLAN: 1. Problems include chronic obstructive pulmonary disease in acute     exacerbation.  Fours years ago, she was GOLD stage II COPD with     class III dyspnea.  Currently, she has COPD exacerbation and     improving.  I suspect with continued smoking she could have     declined in terms of FEV-1 and this could explain current oxygen     dependency and she was completely unaware of this.  In addition,     she would have developed.  For this, given her medication     noncompliance and inability to afford medications, I would only     discharge her on nebulizers at home.  We will check spirometry     today.  We will check ABG.  We will also start p.o. prednisone in     preparation for discharge. 2. Pulmonary nodules and one right mediastinal 1.4-cm lymph node.     These are too small to be biopsied.  I would follow this as an     outpatient.  She needs a PET scan to evaluate that lymph node as an     outpatient and a repeat CT in several months. 3. Right upper lobe bronchiectasis.  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 4. Tobacco abuse.  I have counseled her extensively  about quitting.     We will also get in-hospital tobacco cessation counseling. 5. Undiagnosed sleep apnea. Given her obesity, excessive daytime     somnolence, and nocturnal snoring, I am pretty sure she has sleep     apnea.  She will need an outpatient sleep study which we will set     up when she follows up with [place]. 6. Class III dyspnea along with hypoxemia.  I suspect this can be from     COPD, getting worse over time, and also possible cor pulmonale  secondary to COPD and undiagnosed sleep apnea.  I think the other     possibility she has diastolic dysfunction is evidence of the echo     along with high blood pressure.  For this as an inpatient, I would     recommend optimizing COPD care and also optimizing her blood     pressure and setting up home oxygen at discharge.  We will continue     to follow.  FOLLOWUP:  She will follow with Dr. Marchelle Gearing and one of the [facility] physicians including Dr. Sandrea Hughs.     Kalman Shan, MD     MR/MEDQ  D:  04/09/2010  T:  04/10/2010  Job:  [id number]  cc:   Barbette Hair. Artist Pais, DO  Electronically Signed by Kalman Shan MD on 04/15/2010 08:58:57 PM

## 2010-04-19 ENCOUNTER — Encounter: Payer: Self-pay | Admitting: Internal Medicine

## 2010-04-23 NOTE — H&P (Signed)
[name], [name]                ACCOUNT NO.:  [id number]  MEDICAL RECORD NO.:  [id number]           PATIENT TYPE:  LOCATION:                                 FACILITY:  PHYSICIAN:  Eduard Clos, MDDATE OF BIRTH:  May 07, 1945  DATE OF ADMISSION: DATE OF DISCHARGE:                             HISTORY & PHYSICAL   PRIMARY CARE PHYSICIAN:  Unassigned.  It used to be previously Dr. Artist Pais.  CHIEF COMPLAINT:  Shortness of breath.  HISTORY OF PRESENTING ILLNESS:  A 65 year old female with known history of COPD, hypertension not on any medication due to financial issues, has been experiencing some shortness of breath over the last 1 week with cough and productive sputum, subjective feeling of fever, chills.  The patient was taking some over-the-counter medication despite the patient still had severe shortness of breath.  Initially in the ER, the patient was found to be severely short of breath and was placed on BiPAP.  The patient was given some steroids, nebulizers, and Lasix 80 mg after which the patient's shortness of breath improved.  At this time, the patient has been admitted for further management of her shortness of breath.  The patient states feels good at this time but still short of breath. Denies any chest pain.  Denies dizziness or loss of consciousness. Denies any focal deficit.  Denies any nausea, vomiting, abdominal pain, dysuria, discharge, diarrhea.  PAST MEDICAL HISTORY:  History of asthma and hypertension and COPD.  PAST SURGICAL HISTORY:  Hysterectomy.  MEDICATIONS PRIOR TO ADMISSION:  None.  ALLERGIES:  No known drug allergies.  FAMILY HISTORY:  Significant for esophageal cancer in her mom.  SOCIAL HISTORY:  The patient is divorced.  Full code.  Smokes cigarettes, strongly advised to quit smoking.  Denies any alcohol or drug abuse.  REVIEW OF SYSTEMS:  As per history of presenting illness, nothing else significant.  PHYSICAL EXAMINATION:  GENERAL:   The patient at bedside, not in acute distress, is mildly short of breath. VITAL SIGNS:  Blood pressure 109/70, pulse is around 110-120 sinus tachy, temperature is 98.6, respiration is 24-28 per minute, O2 sat is 94%. HEENT:  Anicteric.  No pallor.  No discharge from ears, eyes, nose, or mouth. CHEST:  Bilateral air entry present.  Bilaterally expiratory wheeze heard.  No crepitation. HEART:  S1 and S2 heard. ABDOMEN:  Soft, nontender.  Bowel sounds heard. CNS:  The patient is alert, awake, oriented to time, place, and person. Moves upper and lower extremities 5/5.Marland Kitchen EXTREMITIES:  Peripheral pulses felt.  No edema.  LABORATORY DATA:  ABG, pH 7.3, pCO2 is 46, pO2 is 77, oxygen saturation is 94%.  CBC, WBC is 8.2, hemoglobin is 18.3, hematocrit is 54.2, platelets 238,000.  Complete metabolic panel, sodium is 141, potassium 3.4, chloride 105, carbon dioxide 26, glucose 137, BUN 13, creatinine 1.2.  Alk phos 90, AST 26, ALT 27.  Total protein 7.6, albumin 4, calcium 9.1.  CK-MB is 1.4, troponin less than 0.05.  BNP less than 30. Myoglobin 167.  UA shows glucose negative, bilirubin negative, ketones negative, leukocytes and nitrites negative, few squamous cells.  EKG shows sinus tachycardia with beats around 127 beats per minute and nonspecific ST-T changes.  Chest x-ray shows mild pulmonary vascular congestion, superimposed on chronic interstitial coarsening, no focal airspace disease.  CT angio chest shows no definite evidence of significant pulmonary embolus, minimal bibasilar atelectasis noted, emphysema within the upper lung lobes, mild bronchiectasis and scarring in the right upper lobe.  Single and large 1.3-cm right peribronchial known noted.  No significant mediastinal lymphadenopathy seen.  Three small pulmonary nodules noted within the right upper and lower lung lobes measuring up to 5 mm in size.  The patient is at high risk for bronchogenic carcinoma.  Followup CT head  recommended.  ASSESSMENT: 1. Shortness of breath, probably from asthmatic bronchitis and chronic     obstructive pulmonary disease exacerbation. 2. Ongoing tobacco abuse. 3. History of hypertension. 4. Bronchiectasis. 5. Pulmonary nodules and peribronchial lymphadenopathy.  PLAN: 1. At this time, we will admit the patient to telemetry. 2. For shortness of breath which most likely is from asthmatic     bronchitis and COPD, the patient will be placed on IV steroids, so     we changed to p.o. prednisone, antibiotics, nebulizers, and     Pulmicort. 3. History of hypertension.  At this time, we will place the patient     on p.r.n. antihypertensives IV during her stay in the hospital.  If     the patient's blood pressure is high, we may have to start     different antihypertensives. 4. Tobacco abuse.  The patient will need counseling on this. 5. Sinus tachycardia.  If there is any further worsening of her heart     rate, then we may have to consider other causes like A-flutter     versus fib. 6. Further recommendation as condition evolves.     Eduard Clos, MD     ANK/MEDQ  D:  04/05/2010  T:  04/05/2010  Job:  [id number]  Electronically Signed by Midge Minium MD on 04/23/2010 07:59:05 AM

## 2010-04-24 ENCOUNTER — Ambulatory Visit (INDEPENDENT_AMBULATORY_CARE_PROVIDER_SITE_OTHER): Payer: Self-pay | Admitting: Internal Medicine

## 2010-04-24 ENCOUNTER — Encounter: Payer: Self-pay | Admitting: Internal Medicine

## 2010-04-24 DIAGNOSIS — Z9119 Patient's noncompliance with other medical treatment and regimen: Secondary | ICD-10-CM | POA: Insufficient documentation

## 2010-04-24 DIAGNOSIS — R0989 Other specified symptoms and signs involving the circulatory and respiratory systems: Secondary | ICD-10-CM

## 2010-04-24 DIAGNOSIS — J984 Other disorders of lung: Secondary | ICD-10-CM

## 2010-04-24 DIAGNOSIS — J479 Bronchiectasis, uncomplicated: Secondary | ICD-10-CM

## 2010-04-24 DIAGNOSIS — R222 Localized swelling, mass and lump, trunk: Secondary | ICD-10-CM

## 2010-04-24 DIAGNOSIS — R0683 Snoring: Secondary | ICD-10-CM | POA: Insufficient documentation

## 2010-04-24 DIAGNOSIS — Z720 Tobacco use: Secondary | ICD-10-CM

## 2010-04-24 DIAGNOSIS — R0609 Other forms of dyspnea: Secondary | ICD-10-CM

## 2010-04-24 DIAGNOSIS — J449 Chronic obstructive pulmonary disease, unspecified: Secondary | ICD-10-CM

## 2010-04-24 DIAGNOSIS — E669 Obesity, unspecified: Secondary | ICD-10-CM

## 2010-04-24 DIAGNOSIS — F17200 Nicotine dependence, unspecified, uncomplicated: Secondary | ICD-10-CM

## 2010-04-24 DIAGNOSIS — J9859 Other diseases of mediastinum, not elsewhere classified: Secondary | ICD-10-CM

## 2010-04-24 DIAGNOSIS — J441 Chronic obstructive pulmonary disease with (acute) exacerbation: Secondary | ICD-10-CM

## 2010-04-24 DIAGNOSIS — R911 Solitary pulmonary nodule: Secondary | ICD-10-CM

## 2010-04-24 MED ORDER — ALBUTEROL SULFATE HFA 108 (90 BASE) MCG/ACT IN AERS: 2.0000 | INHALATION_SPRAY | Freq: Four times a day (QID) | RESPIRATORY_TRACT | Status: DC | PRN

## 2010-04-24 MED ORDER — BECLOMETHASONE DIPROPIONATE 80 MCG/ACT IN AERS: 2.0000 | INHALATION_SPRAY | Freq: Two times a day (BID) | RESPIRATORY_TRACT | Status: DC

## 2010-04-24 NOTE — Assessment & Plan Note (Signed)
Advised for 3 min to quit. REfused meds. Will try on her own

## 2010-04-24 NOTE — Patient Instructions (Signed)
#  COPD  - due to cost stop taking pulmicort neb and xopenex neb - your new regimen is atrovent neb 4 times daily., QVAR inhaler 2 puff bid, albuterol inhaler 2 puff as needed (take samples and show technique) - use oxygen all 24 hours (we will  Hold off on portable system due to cost issues) # sleep apnea  - you probably have this  - have sleep study and fu with Dr. Vassie Loll or Dr Craige Cotta #Smoking  - please quit #CT lung nodule and node  - we will do CT chest in 6 months #Followup  - 2 months or sooner if needed  - if new meds are costly please call us soon  - please see $ counsillor soon

## 2010-04-24 NOTE — Assessment & Plan Note (Signed)
Plan Monitor She is at high risk for frequent admissions

## 2010-04-24 NOTE — Progress Notes (Signed)
  Subjective:    Patient ID: [name], female    DOB: [birth year], 65 y.o.   MRN: [id number]  HPI Obese female hospitalized in March 2012 and now following. 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 OVerall doing much better since discharge. Using o2. No insurance. Unable to afford xopenex and pulmicort neb. Able to afford atroven neb. Wanting med change. Dysponea is at baseline class 3. Reportedly cut down on smoking to 1 cig/day. Mild pedal edema improving. Admits to snoring and excess day time somnolence  Review of Systems  Constitutional:       [obese HENT: Negative for nosebleeds, congestion, facial swelling, rhinorrhea, sneezing, postnasal drip and tinnitus.   Eyes: Negative.   Respiratory: Positive for apnea, shortness of breath and wheezing.        [Snores + xs day time somnolence + All symptoms better since discharge Cardiovascular: Positive for leg swelling. Negative for chest pain and palpitations.       [Edema better Gastrointestinal: Negative.   Genitourinary: Negative.   Musculoskeletal: Positive for arthralgias. Negative for back pain, joint swelling and gait problem.  Skin: Negative.   Neurological: Negative.   Hematological: Negative.   Psychiatric/Behavioral: Negative.        Objective:   Physical Exam  [vitalsreviewed. Constitutional: She is oriented to person, place, and time. She appears well-developed and well-nourished. No distress.       obese  HENT:  Head: Normocephalic and atraumatic.  Right Ear: External ear normal.  Left Ear: External ear normal.  Mouth/Throat: Oropharynx is clear and moist. No oropharyngeal exudate.  Eyes: Conjunctivae and EOM are normal. Pupils are equal, round, and reactive to light. Right eye exhibits  no discharge. Left eye exhibits no discharge. No scleral icterus.  Neck: Normal range of motion. Neck supple. No JVD present. No tracheal deviation present. No thyromegaly present.       Obese neck mallampatti class 3  Cardiovascular: Normal rate, regular rhythm, normal heart sounds and intact distal pulses.  Exam reveals no gallop and no friction rub.   No murmur heard. Pulmonary/Chest: Effort normal. No respiratory distress. She has no wheezes. She has no rales. She exhibits no tenderness.       Diminished air entry Prolonged expiration No wheeze No distress  Abdominal: Soft. Bowel sounds are normal. She exhibits no distension and no mass. There is no tenderness. There is no rebound and no guarding.  Musculoskeletal: Normal range of motion. She exhibits no edema and no tenderness.  Lymphadenopathy:    She has no cervical adenopathy.  Neurological: She is alert and oriented to person, place, and time. She has normal reflexes. No cranial nerve deficit. She exhibits normal muscle tone. Coordination normal.  Skin: Skin is warm and dry. No rash noted. She is not diaphoretic. No erythema. No pallor.  Psychiatric: She has a normal mood and affect. Her behavior is normal. Judgment and thought content normal.          Assessment & Plan:

## 2010-04-24 NOTE — Assessment & Plan Note (Signed)
5mm RUL and RLL march 2012 CT with 1.4 cm Rt peribronchial node  Plan Ct chest with contrast in 6 month

## 2010-04-24 NOTE — Assessment & Plan Note (Signed)
Currently stable disease  Plan Dc pulmicort and xopenex due to cost Use qvar bid hfa, albuterol hfa prn, and atrovent neb q6h At [facility] discuss rehab Wil check spirometry at [facility]

## 2010-04-24 NOTE — Assessment & Plan Note (Signed)
1.4 cm Rt peribronchial lymph node on CT march 2012  Plan Repeat ct chest 6 months

## 2010-04-24 NOTE — Assessment & Plan Note (Signed)
High risk and pretest for OSA  Plan Sleep study [facility] with Dr. Vassie Loll or Dr Craige Cotta

## 2010-04-26 ENCOUNTER — Telehealth: Payer: Self-pay | Admitting: Internal Medicine

## 2010-04-26 NOTE — Telephone Encounter (Signed)
Spoke with son and advised him handicap form was ready for pick up. He verbalized understanding

## 2010-04-26 NOTE — Telephone Encounter (Signed)
Ok for handicap sticker 

## 2010-04-26 NOTE — Telephone Encounter (Signed)
Spoke w/ [name] and she states bc she is on oxygen 24/7, it is hard for her sometimes when she has to park far away from a building to walk up to the door and she states she feels SOB. [name] states she would like to get a handicap place card so she can park closer and doesn't have to exert her self to much. [name] states if MR approves for her to have this, to call her son and he will come by and pick up the form. Dr. Marchelle Gearing please advise if okay to do this. Thanks  Carver Fila, CMA

## 2010-05-14 ENCOUNTER — Telehealth: Payer: Self-pay | Admitting: Internal Medicine

## 2010-05-14 NOTE — Telephone Encounter (Signed)
MR is pt's pulmonary doc, sees Dr. Artist Pais for primary.  Will need to follow up w/ PCP for these refills.  LMOM TCB x1.

## 2010-05-15 NOTE — Telephone Encounter (Signed)
Called, spoke with pt.  States she was wondering why amlodipine and clonidine rxs were not called in on 4/3/112 at [facility] bc albuterol, qvar, xanax, and furosemide were.  I advised pt she needs to get these rxs from PCP.  Pt then was asking for clarification on why MR sent xanax and furosemide.  I advised he did not send these but pt still stating he did.  I called [facility] on [address]  Spoke with Sameer, Pharmacist.  Per Sameer, these rxs were written on 04/11/10 by Dr. Jae Dire and were picked up on 04/26/10.  I called pt back.  I informed her of this.  She verbalized understanding.

## 2010-05-15 NOTE — Telephone Encounter (Signed)
lmomtcb x1 

## 2010-05-15 NOTE — Telephone Encounter (Signed)
Returning call.

## 2010-05-23 ENCOUNTER — Encounter (HOSPITAL_BASED_OUTPATIENT_CLINIC_OR_DEPARTMENT_OTHER): Payer: Self-pay

## 2010-05-28 ENCOUNTER — Ambulatory Visit (HOSPITAL_BASED_OUTPATIENT_CLINIC_OR_DEPARTMENT_OTHER): Payer: Self-pay | Attending: Internal Medicine

## 2010-05-28 DIAGNOSIS — I10 Essential (primary) hypertension: Secondary | ICD-10-CM | POA: Insufficient documentation

## 2010-05-28 DIAGNOSIS — G4710 Hypersomnia, unspecified: Secondary | ICD-10-CM | POA: Insufficient documentation

## 2010-05-28 DIAGNOSIS — G4734 Idiopathic sleep related nonobstructive alveolar hypoventilation: Secondary | ICD-10-CM | POA: Insufficient documentation

## 2010-05-28 DIAGNOSIS — J9610 Chronic respiratory failure, unspecified whether with hypoxia or hypercapnia: Secondary | ICD-10-CM | POA: Insufficient documentation

## 2010-05-28 DIAGNOSIS — J4489 Other specified chronic obstructive pulmonary disease: Secondary | ICD-10-CM | POA: Insufficient documentation

## 2010-05-28 DIAGNOSIS — J449 Chronic obstructive pulmonary disease, unspecified: Secondary | ICD-10-CM | POA: Insufficient documentation

## 2010-05-28 DIAGNOSIS — Z79899 Other long term (current) drug therapy: Secondary | ICD-10-CM | POA: Insufficient documentation

## 2010-06-01 ENCOUNTER — Telehealth: Payer: Self-pay | Admitting: Internal Medicine

## 2010-06-01 NOTE — Telephone Encounter (Signed)
[id number]

## 2010-06-04 NOTE — Telephone Encounter (Signed)
Pt is awaiting form from [employer] and will bring it to the office for MR to review. Pt says she filled out pt assistance forms for Qvar and her son dropped the form off for MR nurse. Victorino Dike, do you remember seeing this form. Pls advise.

## 2010-06-04 NOTE — Telephone Encounter (Signed)
lmomtcb x1 

## 2010-06-05 DIAGNOSIS — J449 Chronic obstructive pulmonary disease, unspecified: Secondary | ICD-10-CM

## 2010-06-05 DIAGNOSIS — G4734 Idiopathic sleep related nonobstructive alveolar hypoventilation: Secondary | ICD-10-CM

## 2010-06-05 NOTE — Procedures (Addendum)
[name], [name]                ACCOUNT NO.:  [id number]  MEDICAL RECORD NO.:  [id number]          PATIENT TYPE:  OUT  LOCATION:  SLEEP CENTER                 FACILITY:  [facility]  PHYSICIAN:  Coralyn Helling, MD        DATE OF BIRTH:  08/14/1945  DATE OF STUDY:  05/28/2010                           NOCTURNAL POLYSOMNOGRAM  REFERRING PHYSICIAN:  MURALI RAMASWAMY  INDICATION FOR STUDY:  [name] is a 65 year old female, who has history of COPD, bronchiectasis, chronic hypoxemic respiratory failure, hypertension, and depression.  She also has snoring, sleep disruption, and daytime sleepiness.  She is therefore referred to the sleep lab for evaluation of hypersomnia with obstructive sleep apnea.  Height is 5 feet 4 inches.  Weight is 220 pounds.  BMI is 38.  Neck size is 16.5 inches.  MEDICATIONS:  Flonase, furosemide, Atrovent, Claritin, Prilosec, Ventolin, and QVAR.  EPWORTH SLEEPINESS SCORE:  8.  SLEEP ARCHITECTURE:  Total recording time is 360 minutes.  Total sleep time is 165 minutes.  Sleep efficiency is 46%.  Sleep latency is 30 minutes.  REM latency is 228 minutes.  The study was notable for the lack of stage III sleep and a reduction in the percentage of REM sleep. The patient slept exclusively in the non-supine position.  RESPIRATORY DATA:  The average respiratory rate was 16.  Moderate snoring was noted by the technician.  The overall apnea-hypopnea index is 1.8.  The respiratory disturbance index is 5.4.  There is one central apneic event.  The remainder of the events were obstructive in nature. The preponderance of respiratory events were noted during REM sleep. However, the patient had minimal amount of REM sleep.  OXYGEN DATA:  The baseline oxygenation on room air was 86%.  The oxygen saturation nadir was 81%.  The patient had initial addition of 1 liter of supplemental oxygen at 12:27 a.m., and then this was increased to 2 liters at 12:57 a.m. with improvement in  her oxygen saturation.  CARDIAC DATA:  The average heart rate was 91 and the rhythm strip showed normal sinus rhythm with occasional PVCs.  MOVEMENT-PARASOMNIA:  The patient had two restroom trips and the periodic limb movement index is 27.  IMPRESSIONS-RECOMMENDATIONS:  This study did not show evidence for significant obstructive sleep apnea.  Of note is that she did not sleep at the supine position and had minimal REM sleep.  As a result, the severity of her obstructive sleep apnea may be underestimated.  She did have evidence for sleep related hypoxemia and hypoventilation. This would be consistent with her stated diagnosis of chronic obstructive pulmonary disease and bronchiectasis.  She had good control of her oxygenation with the addition of 2 liters of supplemental oxygen.  She had a significant increase in her periodic limb movement index and clinical correlation would be necessary to determine the significance of this.  Specifically, she should have questioning regarding symptoms of restless legs syndrome.  In addition to diet, exercise, weight reduction, I would recommend that the patient be continued on 2 liters of supplemental oxygen while asleep, and then have further assessment for the possibility of restless legs syndrome.  Coralyn Helling, MD Diplomat, American Board of Sleep Medicine Electronically Signed    VS/MEDQ  D:  06/05/2010 11:06:14  T:  06/05/2010 23:50:00  Job:  [id number]

## 2010-06-05 NOTE — Telephone Encounter (Signed)
LMOMTCB x 1. Per Victorino Dike pt did not sign the pt assistance forms. Need pt to come by and sign forms.

## 2010-06-06 NOTE — Telephone Encounter (Signed)
I have the patient assistance forms that the pt needs to sing in my look-at box. Carron Curie, CMA

## 2010-06-06 NOTE — Telephone Encounter (Signed)
lmomtcb x 2  

## 2010-06-07 ENCOUNTER — Other Ambulatory Visit: Payer: Self-pay | Admitting: *Deleted

## 2010-06-07 NOTE — Telephone Encounter (Signed)
Refills requested for Xanax, Furosemide, Clonidine and Amlodipine. Pharmacist notified that these medications are not prescribed by Dr. Marchelle Gearing and they will need to send to pt's PCP. The original doctors name on rx was Dr. Andreas Blower.

## 2010-06-07 NOTE — Telephone Encounter (Signed)
lmomtcb  

## 2010-06-08 NOTE — Assessment & Plan Note (Signed)
[name]                             PULMONARY OFFICE NOTE   NAME:[name], Toni                         MRN:          [id number]  DATE:05/27/2006                            DOB:          1945-08-02    PULMONARY/FOLLOWUP VISIT   HISTORY:  This is a 65 year old black female active smoker with chronic  dyspnea on exertion associated with cough, for which I felt she probably  had COPD, but minimum asthma clinically, but could still not get across  the parking lot without getting short of breath.  I suggested she  switch over to Spiriva to see to what extent that would help and  strongly advised her to quit smoking.  She did cut down to 6 per day.  Comes back today on Spiriva as monotherapy for a followup PFT.  She says  she is still having trouble going out in the heat, but overall is  improved in terms of activity tolerance.   She denies any nocturnal wheeze or significant excess sputum production  in the mornings.   PHYSICAL EXAMINATION:  She is a moderately obese, but pleasant  ambulatory black female in no acute distress.  VITAL SIGNS:  Stable vital signs.  HEENT:  Unremarkable.  Oropharynx is clear.  LUNGS:  The lung fields reveal diminished breath sounds, but no  wheezing.  CARDIAC:  Regular rate and rhythm without murmur, gallop, or rub.  ABDOMEN:  Soft and benign.  EXTREMITIES:  Warm without calf tenderness, cyanosis, clubbing, or  edema.   Hemoglobin saturation 92% on room air.   PFT shows an FEV1 of 63% predicted, but the 53% ratio, and a diffusion  capacity of only 33%, not corrected for hemoglobin.  PFT dated March 26, 2006.   IMPRESSION:  Moderate chronic obstructive pulmonary disease, improved  symptomatically on Spiriva with no significant asthmatic component at  present.  If she begins to have exacerbations, I would not hesitate to  either place her back on Symbicort or add Advair, or combine 1 of these  2 with Spiriva.   However, the simpler the better we keep it now.  In  addition, I made it very clear to the patient that her choice to smoke  or not smoke is really the make or break issue here, not choice of  inhalers or, for that matter, choice of doctors.  For that reason, we  will see her back here in the pulmonary clinic on p.r.n. and refer her  back to Dr. Artist Pais for regular followup.   I have asked her to make a commitment to stop smoking.  If she will make  an appointment to see Dr. Artist Pais specifically for the purpose of smoking  cessation, I told her he would be glad to  help her with any of the number of pharmacologic options to help with  smoking cessation and or refer to the [facility].     Casimiro Needle B. Sherene Sires, MD, [facility]  Electronically Signed    MBW/MedQ  DD: 06/28/2006  DT: 06/28/2006  Job #: [id number]   cc:  Barbette Hair. Artist Pais, DO

## 2010-06-08 NOTE — Assessment & Plan Note (Signed)
[facility]                             PULMONARY OFFICE NOTE   NAME:[name], [name]                         MRN:          [id number]  DATE:05/19/2006                            DOB:          16-Apr-1945    CHIEF COMPLAINT:  Dyspnea.   HISTORY OF PRESENT ILLNESS:  This is a 65 year old, black female, active  smoker complaining of dyspnea for years making it difficult at this  point to walk across a parking lot without struggling to catch her  breath.  She has not had associated productive cough or exacerbations  with weather environmental change.  She does report she is some better  after starting Symbicort at Dr. Olegario Messier request prior to this evaluation.  She denies any pleuritic or exertional chest pain without any PND or leg  swelling or unintended weight gain or weight loss.   PAST MEDICAL HISTORY:  Significant for the absence of asthma.   PAST SURGICAL HISTORY:  Status post hysterectomy and remote breast  surgery.   ALLERGIES:  NO KNOWN DRUG ALLERGIES.   MEDICATIONS:  None at present.   SOCIAL HISTORY:  She continues to smoke a half pack per day.  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   FAMILY HISTORY:  Recorded in detail significant for asthma in her  brother.   REVIEW OF SYSTEMS:  Taken in detail on worksheet and negative except as  outlined above.   PHYSICAL EXAMINATION:  GENERAL:  This is a pleasant, ambulatory, obese,  black female in no acute distress.  VITAL SIGNS:  Stable except for saturation of only 83% on room air.  HEENT:  Remarkable for mild turbinate edema.  Oropharynx clear.  NECK:  Supple without cervical adenopathy or thyromegaly.  LUNGS:  Diminished breath sounds, but no wheezing.  She does have a  positive Hoover sign in the supine position.  EXTREMITIES:  Warm without calf tenderness, cyanosis, clubbing or edema.   LABORATORY DATA AND X-RAY FINDINGS:  Chest x-ray showed moderate COPD  pattern only with scarring  in the right upper lobe.  Echocardiogram from  March 2008, showed normal ejection fraction in right chambers.   Hematocrit was 48% recorded on April 09, 2006, with a bicarb level then  of 30.   IMPRESSION:  Chronic obstructive pulmonary disease that is quite severe  with hypoxemia that is disproportionate to evidence of air flow  obstruction consistent with an emphysema component.  Obviously, the act  of smoking is not helping.  Unfortunately, I do not detect much in terms  of asthma clinically, although certainly the response to Symbicort  suggests that there may be a reversible component.   PLAN:  Since she has already tried Symbicort, but not able to get  across the parking lot any easier, I am going to recommend changing to  Spiriva today and showed her how to use it.  If this is not effective in  achieving her goal to improve exercise tolerance, I have asked her to  add Symbicort back to the Spiriva and given her a prescription for this.  I am very concerned about the fact that she is continuing to actively  smoke despite obvious resting hypoxemia.  She flatly declined  consideration for oxygen today and I note that she is not polycythemic  or suffering any other obvious consequences of hypoxemia, so I think the  biggest focus here is to help her stop smoking rather than to add  additional medications that she is not going to take anyway or  interventions that she is not going to follow.     Charlaine Dalton. Sherene Sires, MD, [facility]  Electronically Signed    MBW/MedQ  DD: 05/19/2006  DT: 05/20/2006  Job #: [id number]   cc:   Barbette Hair. Artist Pais, DO

## 2010-06-08 NOTE — Telephone Encounter (Signed)
lmomtcb  

## 2010-06-08 NOTE — Assessment & Plan Note (Signed)
[name]                           PRIMARY CARE OFFICE NOTE   NAME:[name], DURGA                         MRN:          [id number]  DATE:03/06/2006                            DOB:          February 27, 1945    CHIEF COMPLAINT:  New patient practice.   HISTORY OF PRESENT ILLNESS:  Patient is a 65 year old African-American  female here to establish primary care.  She has used urgent care for  medical services in the past.  Her medical history is significant for 40  years of tobacco abuse.  She has tried to quit several times in the  past, has been unsuccessful, she has tried to use over-the-counter  nicotine gum and patches.  She has been concerned due to progressive  symptoms of dyspnea and shortness of breath.  This has been ongoing  times several months.  She notices that she cannot carry things, or lift  things, or climb up stairs without experiencing shortness of breath.  Patient denies any chest pain, and denies any history of heart disease.  She was told in the past that she may have mitral valve prolapse.  This  was diagnosed when she resided in [place].  It has been 20 years  since an echocardiogram was performed.   She denies any orthopnea.  She does not have any lower extremity  swelling.  She does recall having pneumonia twice since she has been in  [place], [place].   PAST MEDICAL HISTORY SUMMARY:  1. Probable COPD with ongoing tobacco abuse.  2. History of cervical cancer, status post hysterectomy.  3. History of breast biopsy.  4. Possible mitral valve prolapse.  5. History of pneumonia.   CURRENT MEDICATIONS:  None.   ALLERGIES TO MEDICATIONS:  None known.   SOCIAL HISTORY:  Patient is married.  She is going through some  difficult times with her husband.  She has 3 grown children.  Her  occupation is working at a call center.   FAMILY HISTORY:  Mother and father are both deceased.  Father at age 83  secondary to motor  vehicle accident, mother secondary to esophageal  cancer.  Patient has 2 siblings, 1 with morbid obesity, otherwise normal  health.   HABITS:  Occasional alcohol, tobacco use, 40 pack years since age 65.  No recreational drug use.   REVIEW OF SYSTEMS:  No fevers, chills, no HEENT symptoms.  Denies any  chest pain or palpitations.  Respiratory symptoms as noted above.  Occasional heartburn, no dysuria, frequency, urgency.  All other systems  negative.   PHYSICAL EXAMINATION:  VITALS:  Height is 5 feet 4 inches, weight is 192  pounds, temperature is 98, pulse is 95, BP is 157/99 in the left arm in  a seated position.  GENERAL:  The patient is a pleasant, well-developed, well-nourished, 62-  year-old African-American female, who appears slightly older than stated  age.  HEENT:  Normocephalic, atraumatic.  Pupils were equal and reactive to  light bilaterally.  Extraocular motility was intact.  Patient was  anicteric.  __________ was within normal limits. External auditory  canals and tympanic membranes are clear bilaterally.  Oropharyngeal exam  was unremarkable.  NECK:  Supple, no adenopathy, carotid bruit, or thyromegaly.  CHEST:  Decreased breath sounds at bases, no active wheezing or rhonchi.  CARDIOVASCULAR:  Regular rate and rhythm, no significant murmurs, rubs,  or gallops appreciated.  ABDOMEN:  Soft, slightly protuberant, nontender, positive bowel sounds,  no organomegaly.  MUSCULOSKELETAL EXAM:  No cyanosis, clubbing, or edema.  SKIN:  Warm and dry.  NEUROLOGIC:  Cranial nerves II-XII were grossly intact.  She was  nonfocal.   EKG was performed in the office which showed normal sinus rhythm at 88  beats per minute.  No acute ST changes, and pathologic Q-waves noted,  and normal R-wave progression.   IMPRESSION/RECOMMENDATIONS:  1. Shortness of breath/dyspnea, unclear etiology.  2. Probable chronic obstructive pulmonary disease with ongoing tobacco      abuse.  3.  History of mitral valve prolapse.  4. History of cervical cancer, status post hysterectomy.  5. Health maintenance.   RECOMMENDATIONS:  Patient will be scheduled for pulmonary function test,  as well as 2D echo.  I am most suspicious that patient has moderate-to-  severe chronic obstructive lung disease, and that is the reason for her  dyspnea.  However, we will need to rule out cardiomyopathy.   She will be sent for CBC, thyroid test, as well as lipid profile and  screening for type 2 diabetes.   She will followup with me in approximately 1 month's time.  She is to  take an enteric-coated baby aspirin once a day until followup visit.   She would like to further discuss mood issues, especially with stress  from her marriage.     Barbette Hair. Artist Pais, DO  Electronically Signed    RDY/MedQ  DD: 03/06/2006  DT: 03/06/2006  Job #: [id number]

## 2010-06-08 NOTE — Telephone Encounter (Signed)
LMTCB x 3 

## 2010-06-11 NOTE — Telephone Encounter (Signed)
I spoke with the patient and she is going to come by and get samples of qvar and proair and sing the PA form where it is needed while she is here. She also states she will drop off a [employer] form the MR needs to sign. I advised we will get forms to MR. Samples and form left at front. Carron Curie, CMA

## 2010-06-25 ENCOUNTER — Telehealth: Payer: Self-pay | Admitting: Internal Medicine

## 2010-06-26 ENCOUNTER — Encounter: Payer: Self-pay | Admitting: Pulmonary Disease

## 2010-06-28 NOTE — Telephone Encounter (Signed)
PA forms were refaxed with corrected information. I have placed in MR look-at the [employer] form. Carron Curie, CMA

## 2010-07-03 NOTE — Telephone Encounter (Signed)
Done and given to Lorenda Ishihara 2 days ago

## 2010-07-16 ENCOUNTER — Telehealth: Payer: Self-pay | Admitting: Internal Medicine

## 2010-07-16 NOTE — Telephone Encounter (Signed)
Spoke with the pt and advised PA med at front desk for pick-up. The pt also requests sample of albuterol inhaler which i placed at front as well. Carron Curie, CMA

## 2010-07-16 NOTE — Telephone Encounter (Signed)
lmomtcb  

## 2010-08-16 ENCOUNTER — Telehealth: Payer: Self-pay | Admitting: Internal Medicine

## 2010-08-16 NOTE — Telephone Encounter (Addendum)
Spoke with pt. She states still waiting on pt assistance meds to arrive. She states that they should have been received by now. I checked in the sample drawer and they were there. I advised pt she can come and pick these up any time and she verbalized understanding.

## 2010-09-12 ENCOUNTER — Telehealth: Payer: Self-pay | Admitting: Internal Medicine

## 2010-09-12 NOTE — Telephone Encounter (Signed)
Pt stated it may be listed under of [place].  Antionette Fairy

## 2010-09-12 NOTE — Telephone Encounter (Signed)
Victorino Dike have you seen these? Thanks. Julaine Hua, CMA

## 2010-09-12 NOTE — Telephone Encounter (Signed)
I think I put them in MR look-at, which he has with him so I will forward the message to him. Please advise if you have forms on this patient. Carron Curie, CMA

## 2010-09-13 NOTE — Telephone Encounter (Signed)
lmomtcb  

## 2010-09-13 NOTE — Telephone Encounter (Signed)
PT CALLING RE: STATUS OF THESE PAPERS. NEEDS ASAP. [name]

## 2010-09-13 NOTE — Telephone Encounter (Signed)
PT RETURNED CALL FROM [facility]. I ADVISED HER RE: MR'S MSG- PT WANTS NURSE TO CALL TOMORROW AND CONFIRM THAT THIS (PACKAGE) HAS IN FACT BEEN SENT OUT (SAYS MAILING ENVELOP WAS INCLUDED). Hazel Sams

## 2010-09-13 NOTE — Telephone Encounter (Signed)
Filled it out last night. I am post call and off today. I will drop it off later today or tomorrow at the office on my desk

## 2010-09-13 NOTE — Telephone Encounter (Signed)
Jennifer pls advise if MR has brought this to you yet?

## 2010-09-14 NOTE — Telephone Encounter (Signed)
Mr brought forms by. I sent them to [employer] and pt aware. Carron Curie, CMA

## 2010-11-12 ENCOUNTER — Encounter: Payer: Self-pay | Admitting: Internal Medicine

## 2010-11-12 ENCOUNTER — Ambulatory Visit (INDEPENDENT_AMBULATORY_CARE_PROVIDER_SITE_OTHER): Payer: Self-pay | Admitting: Internal Medicine

## 2010-11-12 VITALS — BP 160/92 | HR 102 | Temp 97.9°F | Ht 64.0 in | Wt 216.4 lb

## 2010-11-12 DIAGNOSIS — R0609 Other forms of dyspnea: Secondary | ICD-10-CM

## 2010-11-12 DIAGNOSIS — R911 Solitary pulmonary nodule: Secondary | ICD-10-CM

## 2010-11-12 DIAGNOSIS — Z23 Encounter for immunization: Secondary | ICD-10-CM

## 2010-11-12 DIAGNOSIS — J984 Other disorders of lung: Secondary | ICD-10-CM

## 2010-11-12 DIAGNOSIS — J449 Chronic obstructive pulmonary disease, unspecified: Secondary | ICD-10-CM

## 2010-11-12 DIAGNOSIS — F17200 Nicotine dependence, unspecified, uncomplicated: Secondary | ICD-10-CM

## 2010-11-12 DIAGNOSIS — Z720 Tobacco use: Secondary | ICD-10-CM

## 2010-11-12 NOTE — Assessment & Plan Note (Signed)
Advised against e cigs. She is cutting on own but informed her infor on ecigs is poor and currently not advisable  4 min counseling

## 2010-11-12 NOTE — Progress Notes (Signed)
Subjective:    Patient ID: [name], female    DOB: 1945/03/31, 65 y.o.   MRN: [id number]  HPI OV April 2012: Obese female hospitalized in March 2012 and now following. 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 OVerall doing much better since discharge. Using o2. No insurance. Unable to afford xopenex and pulmicort neb. Able to afford atroven neb. Wanting med change. Dysponea is at baseline class 3. Reportedly cut down on smoking to 1 cig/day. Mild pedal edema improving. Admits to snoring and excess day time somnolence   #COPD  - due to cost stop taking pulmicort neb and xopenex neb  - your new regimen is atrovent neb 4 times daily., QVAR inhaler 2 puff bid, albuterol inhaler 2 puff as needed (take samples and show technique)  - use oxygen all 24 hours (we will Hold off on portable system due to cost issues)  # sleep apnea  - you probably have this  - have sleep study and fu with Dr. Vassie Loll or Dr Craige Cotta  #Smoking  - please quit  #CT lung nodule and node  - we will do CT chest in 6 months  #Followup  - 2 months or sooner if needed  - if new meds are costly please call us soon  - please see $ counsillor soon   OV 11/12/10: Followup a) COPD; b) smoking; c) lung nodule; and d) possible sleep apnea. COPD is stable. Using mdi and nebs. Still smoking.  Wants to do e-cigs and is asking infor about it. Had sleep study in May 2012: AHI 1.8, PLM 27 but denies restless legs. $ issues and did not fu with Dr Craige Cotta. Says she is finding it tough to pay bills for sleep apnea. Has not had CT chest as fu for nodule; cost issue again. Does not qualify for medicaid. Medicare few years away. Does not have private health care. Goes to [employer]  Past, Family and Social: no change      Review of Systems  Constitutional: Negative for fever and unexpected weight change.  HENT: Negative for ear pain, nosebleeds, congestion, sore throat, rhinorrhea, sneezing, trouble swallowing, dental problem, postnasal drip and sinus pressure.   Eyes: Negative for redness and itching.  Respiratory: Positive for cough and shortness of breath. Negative for chest tightness and wheezing.   Cardiovascular: Negative for palpitations and leg swelling.  Gastrointestinal: Negative for nausea and vomiting.  Genitourinary: Negative for dysuria.  Musculoskeletal: Negative for joint swelling.  Skin: Negative for rash.  Neurological: Negative for headaches.  Hematological: Does not bruise/bleed easily.  Psychiatric/Behavioral: Negative for dysphoric mood. The patient is not nervous/anxious.        Objective:   Physical Exam vitalsreviewed. Constitutional: She is oriented to person, place, and time. She appears well-developed and well-nourished. No distress.       obese  HENT:  Head: Normocephalic and atraumatic.  Right Ear: External ear normal.  Left Ear: External ear normal.  Mouth/Throat: Oropharynx is clear and moist. No oropharyngeal exudate.  Eyes: Conjunctivae and EOM are normal. Pupils are equal, round, and reactive to light. Right eye exhibits no discharge. Left eye exhibits no discharge. No scleral icterus.  Neck: Normal range of motion. Neck supple. No JVD present. No tracheal deviation present. No thyromegaly present.       Obese neck  mallampatti class 3  Cardiovascular: Normal rate, regular rhythm, normal heart sounds and intact distal pulses.  Exam reveals no gallop and no friction rub.   No murmur heard. Pulmonary/Chest: Effort normal. No respiratory distress. She has no wheezes. She has no rales. She exhibits no tenderness.       Diminished air entry Prolonged expiration No wheeze No distress  Abdominal: Soft. Bowel sounds are normal. She exhibits no distension and no  mass. There is no tenderness. There is no rebound and no guarding.  Musculoskeletal: Normal range of motion. She exhibits no edema and no tenderness.  Lymphadenopathy:    She has no cervical adenopathy.  Neurological: She is alert and oriented to person, place, and time. She has normal reflexes. No cranial nerve deficit. She exhibits normal muscle tone. Coordination normal.  Skin: Skin is warm and dry. No rash noted. She is not diaphoretic. No erythema. No pallor.  Psychiatric: She has a normal mood and affect. Her behavior is normal. Judgment and thought content normal.          Assessment & Plan:

## 2010-11-12 NOTE — Assessment & Plan Note (Addendum)
5mm RUL and RLL march 2012 CT with 1.4 cm Rt peribronchial node.   She has not had fu Ct chest. $ issues. I will have her meet with our office manager to find out cost so she can budget. Will aim to get Ct in 3-6 months from now

## 2010-11-12 NOTE — Assessment & Plan Note (Signed)
Obese. Snores at night. Xs daytime somnolence + May 2012: AHI 1.8. PLM: 27 but no restless leg symptoms She is not willing to fu with Dr Craige Cotta due to cost issues. I d/w him and baed on results he has advised o2 at night

## 2010-11-12 NOTE — Patient Instructions (Signed)
#  sleep study  - you just need to use oxygen with sleep  - you do not need cpap machine #Smoking   - please quit smoking  - at this point research does not recommend e-cigarette #COPD  - you use your nebulizers as scheduled #lung nodule  - you need followup CT chest in next 3-6 months (with contrast)  - meet with my manager to find out cost esp for [facility] patients #Followup  - 6 months  - flu shot today

## 2010-11-12 NOTE — Assessment & Plan Note (Signed)
Stable disease  Plan Continue nebs Flu shot today

## 2011-01-16 ENCOUNTER — Telehealth: Payer: Self-pay | Admitting: Internal Medicine

## 2011-01-16 MED ORDER — AZITHROMYCIN 250 MG PO TABS: ORAL_TABLET | ORAL | Status: AC

## 2011-01-16 NOTE — Telephone Encounter (Signed)
z-pak Delsym 2 tsp tid prn

## 2011-01-16 NOTE — Telephone Encounter (Signed)
Pt c/o cough and congestion since Sun., 12/23. She says she gets very little mucus up with the cough but when she does it is creamy looking. Occasional wheezing. She denies any fever or sore throat. SOB only occurs with coughing spells. MR out of the office and will forward to RA for recs. Pls advise.No Known Allergies

## 2011-01-16 NOTE — Telephone Encounter (Signed)
Called # and was given a fast busy signal. Called pt calle # and had to lm on named VM of RA recs and tcb if she had any questions and that I would send ZPAK to the pharmacy.

## 2011-01-16 NOTE — Telephone Encounter (Signed)
Pt returned triage's call & asked to be called back tonight at [phone].  [name]

## 2011-01-16 NOTE — Telephone Encounter (Signed)
ATC line busy x 3 wcb 

## 2011-02-18 ENCOUNTER — Telehealth: Payer: Self-pay | Admitting: Internal Medicine

## 2011-02-18 NOTE — Telephone Encounter (Signed)
I spoke with the pt and she was calling to see if we had received another shipment of her qvar. I advised we have not received anything. I called [facility] and asked for refill to be sent at [phone] options 3. Meds will be shipped here and I advised the pt I will call her once I receive it. I have left a a sample at the front for the pt because she is out of the med. Carron Curie, CMA

## 2011-02-26 ENCOUNTER — Telehealth: Payer: Self-pay | Admitting: *Deleted

## 2011-02-26 NOTE — Telephone Encounter (Signed)
I called the patient and advised PA meds are here and at the front to be picked up. Pt states understanding. [name], CMA

## 2011-05-01 ENCOUNTER — Telehealth: Payer: Self-pay | Admitting: Internal Medicine

## 2011-05-01 NOTE — Telephone Encounter (Signed)
Spoke with [name] and she states qvar not yet received yet from [facility].  I spoke with the pt and notified of this and she states will call them to check on this.  I have left her a couple of sampled of this in the meantime.  Pt states nothing further needed.

## 2011-05-08 ENCOUNTER — Telehealth: Payer: Self-pay | Admitting: Internal Medicine

## 2011-05-10 MED ORDER — BECLOMETHASONE DIPROPIONATE 80 MCG/ACT IN AERS: 2.0000 | INHALATION_SPRAY | Freq: Two times a day (BID) | RESPIRATORY_TRACT | Status: DC

## 2011-05-10 NOTE — Telephone Encounter (Signed)
For faxed to [facility]. Carron Curie, CMA

## 2011-05-10 NOTE — Telephone Encounter (Signed)
Form completed and rx printed. Will give to MR today to sign. Once signed will fax to [facility]. Carron Curie, CMA

## 2011-05-22 ENCOUNTER — Telehealth: Payer: Self-pay | Admitting: Internal Medicine

## 2011-05-22 NOTE — Telephone Encounter (Signed)
Patient assistant Qvar arrived. Pt is aware. Med placed at front. Carron Curie, CMA

## 2011-09-12 ENCOUNTER — Telehealth: Payer: Self-pay | Admitting: Internal Medicine

## 2011-09-12 NOTE — Telephone Encounter (Signed)
LMTCBx1 to inform the pt that her Qvar through patient assistance has arrived. I have placed the meds at the front. Carron Curie, CMA

## 2011-09-24 NOTE — Telephone Encounter (Signed)
Pt is aware. [name], CMA  

## 2012-02-06 DIAGNOSIS — E559 Vitamin D deficiency, unspecified: Secondary | ICD-10-CM | POA: Diagnosis not present

## 2012-02-06 DIAGNOSIS — I10 Essential (primary) hypertension: Secondary | ICD-10-CM | POA: Diagnosis not present

## 2012-02-06 DIAGNOSIS — R0989 Other specified symptoms and signs involving the circulatory and respiratory systems: Secondary | ICD-10-CM | POA: Diagnosis not present

## 2012-02-06 DIAGNOSIS — Z0000 Encounter for general adult medical examination without abnormal findings: Secondary | ICD-10-CM | POA: Diagnosis not present

## 2012-02-06 DIAGNOSIS — F411 Generalized anxiety disorder: Secondary | ICD-10-CM | POA: Diagnosis not present

## 2012-02-06 DIAGNOSIS — R609 Edema, unspecified: Secondary | ICD-10-CM | POA: Diagnosis not present

## 2012-02-06 DIAGNOSIS — F17200 Nicotine dependence, unspecified, uncomplicated: Secondary | ICD-10-CM | POA: Diagnosis not present

## 2012-02-06 DIAGNOSIS — J449 Chronic obstructive pulmonary disease, unspecified: Secondary | ICD-10-CM | POA: Diagnosis not present

## 2012-02-11 ENCOUNTER — Telehealth: Payer: Self-pay | Admitting: *Deleted

## 2012-02-11 NOTE — Telephone Encounter (Signed)
Patient assistance medications at front. Pt is aware. Carron Curie, CMA

## 2012-02-24 LAB — HM DEXA SCAN

## 2012-02-24 LAB — HM MAMMOGRAPHY

## 2012-03-19 DIAGNOSIS — F321 Major depressive disorder, single episode, moderate: Secondary | ICD-10-CM | POA: Diagnosis not present

## 2012-03-19 DIAGNOSIS — F411 Generalized anxiety disorder: Secondary | ICD-10-CM | POA: Diagnosis not present

## 2012-06-02 ENCOUNTER — Telehealth: Payer: Self-pay | Admitting: Internal Medicine

## 2012-06-02 NOTE — Telephone Encounter (Signed)
Spoke with patient-- Patient would like to know if form was received from [facility] assistance program [name], have you seen this form?

## 2012-06-08 NOTE — Telephone Encounter (Signed)
I have the forms but pt has not been seen since 10-2010 so I atc to schedule an appt but message stated customer not available at this time. WCB. Carron Curie, CMA

## 2012-06-10 NOTE — Telephone Encounter (Signed)
ATC pt x3 - automated message stating "your call cannot be completed as dialed.  Please hang up and try your call again with the area code first."  This was done twice, with the same message as a result.  WCB.

## 2012-06-11 MED ORDER — BECLOMETHASONE DIPROPIONATE 80 MCG/ACT IN AERS: 2.0000 | INHALATION_SPRAY | Freq: Two times a day (BID) | RESPIRATORY_TRACT | Status: DC

## 2012-06-11 NOTE — Telephone Encounter (Signed)
Pt advised needs appt. Appt set for 06-19-12. Sample left at front to last until OV. Carron Curie, CMA

## 2012-06-19 ENCOUNTER — Ambulatory Visit: Payer: Medicare Other | Admitting: Internal Medicine

## 2012-06-22 ENCOUNTER — Ambulatory Visit (INDEPENDENT_AMBULATORY_CARE_PROVIDER_SITE_OTHER): Payer: Medicare Other | Admitting: Internal Medicine

## 2012-06-22 ENCOUNTER — Encounter: Payer: Self-pay | Admitting: Internal Medicine

## 2012-06-22 VITALS — BP 118/70 | HR 100 | Temp 98.4°F | Ht 64.0 in | Wt 219.2 lb

## 2012-06-22 DIAGNOSIS — Z720 Tobacco use: Secondary | ICD-10-CM

## 2012-06-22 DIAGNOSIS — R911 Solitary pulmonary nodule: Secondary | ICD-10-CM

## 2012-06-22 DIAGNOSIS — J441 Chronic obstructive pulmonary disease with (acute) exacerbation: Secondary | ICD-10-CM | POA: Diagnosis not present

## 2012-06-22 DIAGNOSIS — F17200 Nicotine dependence, unspecified, uncomplicated: Secondary | ICD-10-CM

## 2012-06-22 DIAGNOSIS — J449 Chronic obstructive pulmonary disease, unspecified: Secondary | ICD-10-CM

## 2012-06-22 MED ORDER — LEVOFLOXACIN 500 MG PO TABS: 500.0000 mg | ORAL_TABLET | Freq: Every day | ORAL | Status: DC

## 2012-06-22 MED ORDER — UMECLIDINIUM-VILANTEROL 62.5-25 MCG/INH IN AEPB: 1.0000 | INHALATION_SPRAY | Freq: Every day | RESPIRATORY_TRACT | Status: DC

## 2012-06-22 MED ORDER — PREDNISONE 10 MG PO TABS: ORAL_TABLET | ORAL | Status: DC

## 2012-06-22 NOTE — Progress Notes (Signed)
Subjective:    Patient ID: [name], female    DOB: [birth year], 67 y.o.   MRN: [id number] PCP Thomos Lemons, DO  HPI OV April 2012: Obese female hospitalized in March 2012 and now following. 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 OVerall doing much better since discharge. Using o2. No insurance. Unable to afford xopenex and pulmicort neb. Able to afford atroven neb. Wanting med change. Dysponea is at baseline class 3. Reportedly cut down on smoking to 1 cig/day. Mild pedal edema improving. Admits to snoring and excess day time somnolence   #COPD  - due to cost stop taking pulmicort neb and xopenex neb  - your new regimen is atrovent neb 4 times daily., QVAR inhaler 2 puff bid, albuterol inhaler 2 puff as needed (take samples and show technique)  - use oxygen all 24 hours (we will Hold off on portable system due to cost issues)  # sleep apnea  - you probably have this  - have sleep study and fu with Dr. Vassie Loll or Dr Craige Cotta  #Smoking  - please quit  #CT lung nodule and node  - we will do CT chest in 6 months  #Followup  - 2 months or sooner if needed  - if new meds are costly please call us soon  - please see $ counsillor soon   OV 11/12/10: Followup a) COPD; b) smoking; c) lung nodule; and d) possible sleep apnea. COPD is stable. Using mdi and nebs. Still smoking.  Wants to do e-cigs and is asking infor about it. Had sleep study in May 2012: AHI 1.8, PLM 27 but denies restless legs. $ issues and did not fu with Dr Craige Cotta. Says she is finding it tough to pay bills for sleep apnea. Has not had CT chest as fu for nodule; cost issue again. Does not qualify for medicaid. Medicare few years away. Does not have private health care. Goes to [employer]  Past, Family and  Social: no change    #sleep study  - you just need to use oxygen with sleep  - you do not need cpap machine  #Smoking  - please quit smoking  - at this point research does not recommend e-cigarette  #COPD  - you use your nebulizers as scheduled  #lung nodule  - you need followup CT chest in next 3-6 months (with contrast)  - meet with my manager to find out cost esp for [facility] patients  #Followup  - 6 months  - flu shot today   OV 06/22/2012  Followup a) COPD; b) smoking; c) lung nodule;  Have not seen her since October 2012. She only now got her Medicare in April 2015 and therefore made this appointment. Overall she says she was stable but now for the past 3 weeks is having increased cough, wheezing, chest tightness, increased white color phlegm. She is not using her medications correctly. She is finding nebulizers to much of a hassle to use. She does use her Qvar but I'm not sure of her compliance. COPD cat score is 35.  In terms of smoking: She continues to smoke conventional tobacco. The office smells of tobacco. She's not sure she will be interested in quitting  In terms of lung nodule: It has been over 2 years since she had her last CT  scan of the chest. She never wanted to have a CT scan of the chest because she had to pay for it out of pocket. But now that she has Medicare she is willing to have the CT scan of the chest   CAT COPD Symptom & Quality of Life Score (GSK trademark) 0 is no burden. 5 is highest burden 06/22/2012 flareup in 3 weeks  Never Cough -> Cough all the time 3  No phlegm in chest -> Chest is full of phlegm 4  No chest tightness -> Chest feels very tight 4  No dyspnea for 1 flight stairs/hill -> Very dyspneic for 1 flight of stairs 5  No limitations for ADL at home -> Very limited with ADL at home 5  Confident leaving home -> Not at all confident leaving home 5  Sleep soundly -> Do not sleep soundly because of lung condition 5  Lots of Energy -> No  energy at all 4  TOTAL Score (max 40)  35     Past, Family, Social reviewed: no change since last visit   Review of Systems  Constitutional: Negative for fever and unexpected weight change.  HENT: Positive for sore throat. Negative for ear pain, nosebleeds, congestion, rhinorrhea, sneezing, trouble swallowing, dental problem, postnasal drip and sinus pressure.   Eyes: Negative for redness and itching.  Respiratory: Positive for cough and shortness of breath. Negative for chest tightness and wheezing.   Cardiovascular: Negative for palpitations and leg swelling.  Gastrointestinal: Negative for nausea and vomiting.  Genitourinary: Negative for dysuria.  Musculoskeletal: Negative for joint swelling.  Skin: Negative for rash.  Neurological: Negative for headaches.  Hematological: Does not bruise/bleed easily.  Psychiatric/Behavioral: Negative for dysphoric mood. The patient is not nervous/anxious.    Current outpatient prescriptions:albuterol (PROVENTIL HFA;VENTOLIN HFA) 108 (90 BASE) MCG/ACT inhaler, Inhale 2 puffs into the lungs every 6 (six) hours as needed.  , Disp: , Rfl: ;  albuterol (PROVENTIL) (2.5 MG/3ML) 0.083% nebulizer solution, Take 2.5 mg by nebulization every 6 (six) hours as needed for wheezing., Disp: , Rfl: ;  amLODipine (NORVASC) 10 MG tablet, Take 10 mg by mouth daily.  , Disp: , Rfl:  beclomethasone (QVAR) 80 MCG/ACT inhaler, Inhale 2 puffs into the lungs 2 (two) times daily., Disp: 1 Inhaler, Rfl: 0;  cloNIDine (CATAPRES) 0.1 MG tablet, Take 0.1 mg by mouth 2 (two) times daily.  , Disp: , Rfl: ;  furosemide (LASIX) 20 MG tablet, Take 20 mg by mouth daily.  , Disp: , Rfl: ;  ALPRAZolam (XANAX) 0.25 MG tablet, Take 0.25 mg by mouth 2 (two) times daily as needed.  , Disp: , Rfl:  fluticasone (FLONASE) 50 MCG/ACT nasal spray, Place 2 sprays into the nose as needed. , Disp: , Rfl: ;  ipratropium (ATROVENT) 0.02 % nebulizer solution, Take 500 mcg by nebulization 4 (four) times  daily., Disp: , Rfl: ;  loratadine (CLARITIN) 10 MG tablet, Take 10 mg by mouth as needed. , Disp: , Rfl:       Objective:   Physical Exam vitalsreviewed. Constitutional: She is oriented to person, place, and time. She appears well-developed and well-nourished. No distress.       obese  HENT:  Head: Normocephalic and atraumatic.  Right Ear: External ear normal.  Left Ear: External ear normal.  Mouth/Throat: Oropharynx is clear and moist. No oropharyngeal exudate.  Eyes: Conjunctivae and EOM are normal. Pupils are equal, round, and reactive to light. Right eye exhibits no discharge. Left eye exhibits  no discharge. No scleral icterus.  Neck: Normal range of motion. Neck supple. No JVD present. No tracheal deviation present. No thyromegaly present.       Obese neck mallampatti class 3  Cardiovascular: Normal rate, regular rhythm, normal heart sounds and intact distal pulses.  Exam reveals no gallop and no friction rub.   No murmur heard. Pulmonary/Chest: Effort normal. No respiratory distress. She has no wheezes. She has no rales. She exhibits no tenderness.       Diminished air entry Prolonged expiration No wheeze No distress  Abdominal: Soft. Bowel sounds are normal. She exhibits no distension and no mass. There is no tenderness. There is no rebound and no guarding.  Musculoskeletal: Normal range of motion. She exhibits no edema and no tenderness.  Lymphadenopathy:    She has no cervical adenopathy.  Neurological: She is alert and oriented to person, place, and time. She has normal reflexes. No cranial nerve deficit. She exhibits normal muscle tone. Coordination normal.  Skin: Skin is warm and dry. No rash noted. She is not diaphoretic. No erythema. No pallor.  Psychiatric: She has a normal mood and affect. Her behavior is normal. Judgment and thought content normal.          Assessment & Plan:

## 2012-06-22 NOTE — Assessment & Plan Note (Signed)
#  COPD flareup - You are having a COPD flare up right now -  take levaquin 500mg  once daily  X 5 days - Please Please take prednisone 40 mg x1 day, then 30 mg x1 day, then 20 mg x1 day, then 10 mg x1 day, and then 5 mg x1 day and stop

## 2012-06-22 NOTE — Assessment & Plan Note (Signed)
5mm RUL and RLL march 2012 CT with 1.4 cm Rt peribronchial node  #Lung nodule  = have followup CT chest - 2 year followup

## 2012-06-22 NOTE — Patient Instructions (Addendum)
#  COPD flareup - You are having a COPD flare up right now -  take levaquin 500mg  once daily  X 5 days - Please Please take prednisone 40 mg x1 day, then 30 mg x1 day, then 20 mg x1 day, then 10 mg x1 day, and then 5 mg x1 day and stop  #COPD - Continue Qvar 80 mcg 2 puff 2 times a day - Try ANORO 1 puff once daily  - this medication is a trial only   - learn technique from my CMA  #Lung nodule  = have followup CT chest - 2 year followup  #Smoking  - please quit  #Followup  - full PFT in 5 weeks - return after ct chest in 5 weeks

## 2012-06-22 NOTE — Assessment & Plan Note (Signed)
She is financial difficulties but also compliance issues. She is not using Atrovent nebulizers due to inconvenience of the schedule. I will try her on a once a day inhaler of long-acting beta agonist and long acting anticholinergic  and assess   #COPD - Continue Qvar 80 mcg 2 puff 2 times a day - Try ANORO 1 puff once daily  - this medication is a trial only   - learn technique from my CMA #Followup  - full PFT in 5 weeks - return after ct chest in 5 weeks

## 2012-06-22 NOTE — Assessment & Plan Note (Signed)
Advised her to accept the concept of having to quit smoking is beneficial to her health

## 2012-06-25 ENCOUNTER — Ambulatory Visit (INDEPENDENT_AMBULATORY_CARE_PROVIDER_SITE_OTHER)
Admission: RE | Admit: 2012-06-25 | Discharge: 2012-06-25 | Disposition: A | Discharge status: Home / Self Care | Payer: Medicare Other | Source: Ambulatory Visit | Attending: Internal Medicine | Admitting: Internal Medicine

## 2012-06-25 ENCOUNTER — Telehealth: Payer: Self-pay | Admitting: Internal Medicine

## 2012-06-25 DIAGNOSIS — R911 Solitary pulmonary nodule: Secondary | ICD-10-CM

## 2012-06-25 DIAGNOSIS — J449 Chronic obstructive pulmonary disease, unspecified: Secondary | ICD-10-CM | POA: Diagnosis not present

## 2012-06-25 NOTE — Telephone Encounter (Signed)
Pt returned call. [name]  

## 2012-06-25 NOTE — Telephone Encounter (Signed)
LMTCB

## 2012-06-25 NOTE — Telephone Encounter (Signed)
LMOM x 1 

## 2012-06-26 NOTE — Telephone Encounter (Signed)
Pt called back to check on status.  Please advise asap. Toni Kitchen [name]

## 2012-06-26 NOTE — Telephone Encounter (Signed)
Defer to dr Marchelle Gearing to decide about O2

## 2012-06-26 NOTE — Telephone Encounter (Signed)
MR is out of the office until 6/13 and pt is requesting recs today.  RA  Please advise. thanks

## 2012-06-26 NOTE — Telephone Encounter (Signed)
Spoke with RA and he stated that since the pts oxygen has been low and she has been given the abx and pred without helping her, she will need to come into the office for OV to have her oxygen checked.  appt has been scheduled for the pt to see MW on 6/10 at 9:45.  Pt is aware of appt and will come in for this. Nothing further is needed.

## 2012-06-26 NOTE — Telephone Encounter (Signed)
Pt returned call.  She states she has been taking prednisone.  She will take her last pill today, but feels her shortness of breath has gotten worse.  Also having swelling in her feet.  She can be reached @ [phone]. Leanora Ivanoff

## 2012-06-26 NOTE — Telephone Encounter (Signed)
Spoke with patient-- Patient states when she went to get her CT done yesterday she became very SOB and had to be pushed in wheel chair to exam room. She states the tech checked her o2 level and it read 85%-- however they did not place her on o2  She states she still feels congested, feet are swelling and her anxiety is increasing since last OV 06/22/12 Patient was given Abx and pred taper and has finished these but states she does not feel they have helped, feels sx are worsening Patient requesting recs, advised MR is off today and msg would be received by our doc of day Vassie Loll) OV 06/22/12: Patient Instructions    #COPD flareup  - You are having a COPD flare up right now  - take levaquin 500mg  once daily X 5 days  - Please Please take prednisone 40 mg x1 day, then 30 mg x1 day, then 20 mg x1 day, then 10 mg x1 day, and then 5 mg x1 day and stop  #COPD  - Continue Qvar 80 mcg 2 puff 2 times a day  - Try ANORO 1 puff once daily  - this medication is a trial only  - learn technique from my CMA    Patient would also like to know why she is not on o2-- She states at her last OV 06/22/12 her o2 was 88% which qualifies her for o2-- (on [facility] patients o2 is documented at 88%) States in 2012 MR stated she only needs o2 at night but patient feels she needs it with exertion as well OV 11/12/10: Patient Instructions    #sleep study  - you just need to use oxygen with sleep  - you do not need cpap machine      Dr. Vassie Loll Please advise

## 2012-06-30 ENCOUNTER — Encounter: Payer: Self-pay | Admitting: Internal Medicine

## 2012-06-30 ENCOUNTER — Ambulatory Visit (INDEPENDENT_AMBULATORY_CARE_PROVIDER_SITE_OTHER): Payer: Medicare Other | Admitting: Internal Medicine

## 2012-06-30 VITALS — BP 122/82 | HR 106 | Temp 97.0°F | Ht 64.25 in | Wt 221.0 lb

## 2012-06-30 DIAGNOSIS — J449 Chronic obstructive pulmonary disease, unspecified: Secondary | ICD-10-CM | POA: Diagnosis not present

## 2012-06-30 DIAGNOSIS — J9610 Chronic respiratory failure, unspecified whether with hypoxia or hypercapnia: Secondary | ICD-10-CM | POA: Diagnosis not present

## 2012-06-30 MED ORDER — MOMETASONE FURO-FORMOTEROL FUM 200-5 MCG/ACT IN AERO: INHALATION_SPRAY | RESPIRATORY_TRACT | Status: DC

## 2012-06-30 MED ORDER — FUROSEMIDE 20 MG PO TABS: 20.0000 mg | ORAL_TABLET | Freq: Every day | ORAL | Status: DC

## 2012-06-30 NOTE — Assessment & Plan Note (Signed)
-   06/30/2012 desat to 86%  RA walking 50 ft, recovered to 90% at rest - 06/30/2012  Walked 1lpm x 3 laps @ 185 ft each stopped due to  Sob, no desat  rec 02 2lpm with activity and sleeping, ok at rest

## 2012-06-30 NOTE — Assessment & Plan Note (Signed)
Symptoms are markedly disproportionate to objective findings and not clear this is a lung problem but pt does appear to have difficult airway management issues. DDX of  difficult airways managment all start with A and  include Adherence, Ace Inhibitors, Acid Reflux, Active Sinus Disease, Alpha 1 Antitripsin deficiency, Anxiety masquerading as Airways dz,  ABPA,  allergy(esp in young), Aspiration (esp in elderly), Adverse effects of DPI,  Active smokers, plus two Bs  = Bronchiectasis and Beta blocker use..and one C= CHF  Adherence is always the initial "prime suspect" and is a multilayered concern that requires a "trust but verify" approach in every patient - starting with knowing how to use medications, especially inhalers, correctly, keeping up with refills and understanding the fundamental difference between maintenance and prns vs those medications only taken for a very short course and then stopped and not refilled. The proper method of use, as well as anticipated side effects, of a metered-dose inhaler are discussed and demonstrated to the patient. Improved effectiveness after extensive coaching during this visit to a level of approximately  75% so try dulera 200 2bid  ? Adverse effect of dpi > try off  ? Acid reflux > reviewed diet and otc tx

## 2012-06-30 NOTE — Progress Notes (Signed)
Subjective:    Patient ID: [name], female    DOB: 1945-03-04, 67 y.o.   MRN: [id number] PCP Toni Lemons, DO  HPI OV April 2012: Obese female hospitalized in March 2012 and now following. 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 OVerall doing much better since discharge. Using o2. No insurance. Unable to afford xopenex and pulmicort neb. Able to afford atroven neb. Wanting med change. Dysponea is at baseline class 3. Reportedly cut down on smoking to 1 cig/day. Mild pedal edema improving. Admits to snoring and excess day time somnolence   #COPD  - due to cost stop taking pulmicort neb and xopenex neb  - your new regimen is atrovent neb 4 times daily., QVAR inhaler 2 puff bid, albuterol inhaler 2 puff as needed (take samples and show technique)  - use oxygen all 24 hours (we will Hold off on portable system due to cost issues)  # sleep apnea  - you probably have this  - have sleep study and fu with Dr. Vassie [name] or Dr [name]  #Smoking  - please quit  #CT lung nodule and node  - we will do CT chest in 6 months  #Followup  - 2 months or sooner if needed  - if new meds are costly please call us soon  - please see $ counsillor soon   OV 11/12/10: Followup a) COPD; b) smoking; c) lung nodule; and d) possible sleep apnea. COPD is stable. Using mdi and nebs. Still smoking.  Wants to do e-cigs and is asking infor about it. Had sleep study in May 2012: AHI 1.8, PLM 27 but denies restless legs. $ issues and did not fu with Dr [name]. Says she is finding it tough to pay bills for sleep apnea. Has not had CT chest as fu for nodule; cost issue again. Does not qualify for medicaid. Medicare few years away. Does not have private health care. Goes to [employer]  Past, Family and  Social: no change    #sleep study  - you just need to use oxygen with sleep  - you do not need cpap machine  #Smoking  - please quit smoking  - at this point research does not recommend e-cigarette  #COPD  - you use your nebulizers as scheduled  #lung nodule  - you need followup CT chest in next 3-6 months (with contrast)  - meet with my manager to find out cost esp for [facility] patients  #Followup  - 6 months  - flu shot today   OV 06/22/2012  Followup a) COPD; b) smoking; c) lung nodule;  Have not seen her since October 2012. She only now got her Medicare in April 2015 and therefore made this appointment. Overall she says she was stable but now for the past 3 weeks is having increased cough, wheezing, chest tightness, increased white color phlegm. She is not using her medications correctly. She is finding nebulizers to much of a hassle to use. She does use her Qvar but I'm not sure of her compliance. COPD cat score is 35.  In terms of smoking: She continues to smoke conventional tobacco. The office smells of tobacco. She's not sure she will be interested in quitting  In terms of lung nodule: It has been over 2 years since she had her last CT  scan of the chest. She never wanted to have a CT scan of the chest because she had to pay for it out of pocket. But now that she has Medicare she is willing to have the CT scan of the chest  rec #COPD flareup - You are having a COPD flare up right now -  take levaquin 500mg  once daily  X 5 days - Please Please take prednisone 40 mg x1 day, then 30 mg x1 day, then 20 mg x1 day, then 10 mg x1 day, and then 5 mg x1 day and stop  #COPD - Continue Qvar 80 mcg 2 puff 2 times a day - Try ANORO 1 puff once daily  - this medication is a trial only   - learn technique from my CMA  #Lung nodule  = have followup CT chest - 2 year followup  #Smoking  - please quit  #Followup  - full PFT in 5 weeks - return after ct chest in 5  weeks   06/30/2012 f/u ov/[name] still smoking Chief Complaint  Patient presents with  . Acute Visit    Pt states breathing is no better since visit here on 06/22/12. Still having alot of SOB and prod cough and mucus gets hung in her throat.   not using 02 daytime x after gets tired and sits down, has only tried the neb x one and it did help. Mucus is clear  No obvious daytime variabilty or assoc  cp or chest tightness, subjective wheeze overt sinus or hb symptoms. No unusual exp hx or h/o childhood pna/ asthma or premature birth to her knowledge.   Sleeping ok without nocturnal  or early am exacerbation  of respiratory  c/o's or need for noct saba. Also denies any obvious fluctuation of symptoms with weather or environmental changes or other aggravating or alleviating factors except as outlined above   Current Medications, Allergies, Past Medical History, Past Surgical History, Family History, and Social History were reviewed in [employer] record.  ROS  The following are not active complaints unless bolded sore throat, dysphagia, dental problems, itching, sneezing,  nasal congestion or excess/ purulent secretions, ear ache,   fever, chills, sweats, unintended wt loss, pleuritic or exertional cp, hemoptysis,  orthopnea pnd or leg swelling, presyncope, palpitations, heartburn, abdominal pain, anorexia, nausea, vomiting, diarrhea  or change in bowel or urinary habits, change in stools or urine, dysuria,hematuria,  rash, arthralgias, visual complaints, headache, numbness weakness or ataxia or problems with walking or coordination,  change in mood/affect or memory.               Objective:   Physical Exam Wt Readings from Last 3 Encounters:  06/30/12 221 lb (100.245 kg)  06/22/12 219 lb 3.2 oz (99.428 kg)  11/12/10 216 lb 6.4 oz (98.158 kg)    Constitutional: She is oriented to person, place, and time. She appears well-developed and well-nourished. No distress.        obese  HENT:  Head: Normocephalic and atraumatic.  Right Ear: External ear normal.  Left Ear: External ear normal.  Mouth/Throat: Oropharynx is clear and moist. No oropharyngeal exudate.  Eyes: Conjunctivae and EOM are normal. Pupils are equal, round, and reactive to light. Right eye exhibits no discharge. Left eye exhibits no discharge. No scleral icterus.  Neck: Normal range of motion. Neck supple. No JVD present. No tracheal deviation present. No thyromegaly present.       Obese neck mallampatti class 3  Cardiovascular: Normal  rate, regular rhythm, normal heart sounds and intact distal pulses.  Exam reveals no gallop and no friction rub.   No murmur heard. Pulmonary/Chest: Effort normal. No respiratory distress. She has no wheezes. She has no rales. She exhibits no tenderness.       Diminished air entry Prolonged expiration No wheeze No distress  Abdominal: Soft. Bowel sounds are normal. She exhibits no distension and no mass. There is no tenderness. There is no rebound and no guarding.  Musculoskeletal: Normal range of motion. She exhibits no edema and no tenderness.  Lymphadenopathy:    She has no cervical adenopathy.  Neurological: She is alert and oriented to person, place, and time. She has normal reflexes. No cranial nerve deficit. She exhibits normal muscle tone. Coordination normal.  Skin: Skin is warm and dry. No rash noted. She is not diaphoretic. No erythema. No pallor.  Psychiatric: She has a normal mood and affect. Her behavior is normal. Judgment and thought content normal.     06/25/12 CT chest s contrast 1. The previously seen right lung nodules are no longer  demonstrated. The third nodule is stable. This could be further  evaluated with a repeat chest CT without contrast in 9-12 months.  2. Progressive mediastinal adenopathy.  3. Changes of COPD and chronic bronchitis.  4. Linear scarring at the left lung base.  5. Diffuse hepatic steatosis.      Assessment &  Plan:

## 2012-06-30 NOTE — Patient Instructions (Addendum)
dulera 200 Take 2 puffs first thing in am and then another 2 puffs about 12 hours later.   Only use your albuterol as a rescue medication to be used if you can't catch your breath by resting or doing a relaxed purse lip breathing pattern. The less you use it, the better it will work when you need it.  Ok to use up to every 4 hours if needed but goal is less than twice weekly  The key is to stop smoking completely before smoking completely stops you!   Wear 02 with activity that makes you short of breath - Please see patient coordinator before you leave today  to schedule ambulatory 02 at 2lpm  Stop qvar and Anoro   If you continue to have sensation of something stuck in throat > Try prilosec 20mg   Take 30-60 min before first meal of the day and Pepcid 20 mg one bedtime until return  GERD (REFLUX)  is an extremely common cause of respiratory symptoms, many times with no significant heartburn at all.    It can be treated with medication, but also with lifestyle changes including avoidance of late meals, excessive alcohol, smoking cessation, and avoid fatty foods, chocolate, peppermint, colas, red wine, and acidic juices such as orange juice.  NO MINT OR MENTHOL PRODUCTS SO NO COUGH DROPS  USE SUGARLESS CANDY INSTEAD (jolley ranchers or Stover's)  NO OIL BASED VITAMINS - use powdered substitutes.   Keep appt with [facility]

## 2012-07-03 ENCOUNTER — Telehealth: Payer: Self-pay | Admitting: Internal Medicine

## 2012-07-03 NOTE — Telephone Encounter (Signed)
Called and spoke with pt and she is aware that [facility] will follow up with [facility] on this order for the oxygen on Monday.  Rhonda asked that this message be sent to her so she can follow up with Melissa with [facility].

## 2012-07-06 NOTE — Telephone Encounter (Signed)
Melissa with [facility] called and spoke with patient. Pt has been on o2 since 2012 with [facility]. Pt has portable tanks, and a concentrator by [facility]. Pt was out of her portable tanks but had the concentrator.  Melissa with [facility], has arranged for [facility] to evaluate pt and re-instruct patient on o2. [facility] will deliver pt tanks today and re-educate patient. [facility] will also set patient up on a regular delivery system for o2 tanks and needs.

## 2012-07-06 NOTE — Telephone Encounter (Signed)
Called and spoke with Melissa at [facility]. Advised that order was sent to [facility] on Tues. 06/30/12. Pt still doesn't have any oxygen and hasn't heard from [facility]. Urgent staff message sent late Friday. Melissa with follow up on order and see if [facility] needs any additional information and will have [facility] arrange to contact patient and deliver o2 today. Rhonda J Cobb

## 2012-07-15 ENCOUNTER — Telehealth: Payer: Self-pay | Admitting: Internal Medicine

## 2012-07-15 NOTE — Telephone Encounter (Signed)
If symptoms are bad enough that they cannot wait tilll tomorrow they should go to ER. This is more so because [name]  was recently Rx in office and they felt bad enough she has to go to ER. Rx over phone is kind of risky   Dr. Kalman Shan, M.D., [facility].C.P Pulmonary and Critical Care Medicine Staff Physician [facility] Pager: [phone], If no answer or between  15:00h - 7:00h: call [phone]  07/15/2012 5:33 PM

## 2012-07-15 NOTE — Telephone Encounter (Signed)
Pt is returning triage's call.  [name] ° °

## 2012-07-15 NOTE — Telephone Encounter (Signed)
I spoke with pt and is aware of recs. Nothing further was needed. She stated she is fine at rest. She is fine with waiting until tomorrow. Nothing further was needed

## 2012-07-15 NOTE — Telephone Encounter (Signed)
lmomtcb  

## 2012-07-15 NOTE — Telephone Encounter (Signed)
Spoke with patient-states she has OV with TP on Thursday 07-16-12 at 2:30pm; she would like to know any other recs from MR prior to OV with TP tomorrow. Pt states she is having Increased SOB, slight wheezing in am, swelling in feet (worse during the day-was given Lasix by MW on 06-30-12). Last night was so bad that she almost went to the ER-had labored breathing; denies any Chest pain. MR, please advise. Thanks.   Pt was not in any acute distress.

## 2012-07-16 ENCOUNTER — Other Ambulatory Visit (INDEPENDENT_AMBULATORY_CARE_PROVIDER_SITE_OTHER): Payer: Medicare Other

## 2012-07-16 ENCOUNTER — Ambulatory Visit (INDEPENDENT_AMBULATORY_CARE_PROVIDER_SITE_OTHER): Payer: Medicare Other | Admitting: Adult Health

## 2012-07-16 ENCOUNTER — Encounter: Payer: Self-pay | Admitting: Adult Health

## 2012-07-16 VITALS — BP 144/76 | HR 88 | Temp 99.2°F | Ht 64.25 in | Wt 225.6 lb

## 2012-07-16 DIAGNOSIS — R0989 Other specified symptoms and signs involving the circulatory and respiratory systems: Secondary | ICD-10-CM

## 2012-07-16 DIAGNOSIS — J449 Chronic obstructive pulmonary disease, unspecified: Secondary | ICD-10-CM

## 2012-07-16 DIAGNOSIS — R0600 Dyspnea, unspecified: Secondary | ICD-10-CM

## 2012-07-16 DIAGNOSIS — M7989 Other specified soft tissue disorders: Secondary | ICD-10-CM

## 2012-07-16 DIAGNOSIS — F4322 Adjustment disorder with anxiety: Secondary | ICD-10-CM

## 2012-07-16 DIAGNOSIS — R0609 Other forms of dyspnea: Secondary | ICD-10-CM

## 2012-07-16 MED ORDER — OMEPRAZOLE 20 MG PO CPDR: 20.0000 mg | DELAYED_RELEASE_CAPSULE | Freq: Every day | ORAL | Status: DC

## 2012-07-16 MED ORDER — ALPRAZOLAM 0.5 MG PO TABS: 0.5000 mg | ORAL_TABLET | Freq: Two times a day (BID) | ORAL | Status: DC | PRN

## 2012-07-16 MED ORDER — FAMOTIDINE 20 MG PO TABS: 20.0000 mg | ORAL_TABLET | Freq: Every day | ORAL | Status: DC

## 2012-07-16 NOTE — Assessment & Plan Note (Signed)
Xanax 0.5mg  Twice daily  As needed  Anxiety - will need to get refills thru [facility]  NO SMOKING- at all especially in home where oxygen tanks/machine is present.  Please contact office for sooner follow up if symptoms do not improve or worsen or seek emergency care  follow up Dr. Marchelle Gearing in 2 weeks and As needed

## 2012-07-16 NOTE — Patient Instructions (Addendum)
Continue on Dulera 2 puffs Twice daily   Begin Prilosec 20mg  daily before meal  Begin Pepcid 20mg  At bedtime   Xanax 0.5mg  Twice daily  As needed  Anxiety - will need to get refills thru [facility]  NO SMOKING- at all especially in home where oxygen tanks/machine is present.  Keep legs elevated.  Take Lasix 20mg  2 tabs daily for 3 days then 1 tab daily  We are setting you up for an ultrasound of your right leg .  Please contact office for sooner follow up if symptoms do not improve or worsen or seek emergency care  follow up Dr. Marchelle Gearing in 2 weeks and As needed

## 2012-07-16 NOTE — Addendum Note (Signed)
Addended by: Reynaldo Minium C on: 07/16/2012 05:07 PM   Modules accepted: Orders

## 2012-07-16 NOTE — Assessment & Plan Note (Signed)
Mild flare with ? Vol overload Check bnp, bmet  Right leg swelling >left -suspect is venous insufficiency.   Plan  Keep legs elevated.  Take Lasix 20mg  2 tabs daily for 3 days then 1 tab daily  We are setting you up for an ultrasound of your right leg .  Please contact office for sooner follow up if symptoms do not improve or worsen or seek emergency care  follow up Dr. Marchelle Gearing in 2 weeks and As needed

## 2012-07-16 NOTE — Addendum Note (Signed)
Addended by: Boone Master E on: 07/16/2012 03:59 PM   Modules accepted: Orders

## 2012-07-16 NOTE — Assessment & Plan Note (Signed)
Right leg swelling , check venous doppler.

## 2012-07-16 NOTE — Progress Notes (Signed)
Subjective:    Patient ID: [name], female    DOB: 1945/05/12, 67 y.o.   MRN: [id number] PCP Thomos Lemons, DO  HPI OV April 2012: Obese female hospitalized in March 2012 and now following. 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 OVerall doing much better since discharge. Using o2. No insurance. Unable to afford xopenex and pulmicort neb. Able to afford atroven neb. Wanting med change. Dysponea is at baseline class 3. Reportedly cut down on smoking to 1 cig/day. Mild pedal edema improving. Admits to snoring and excess day time somnolence   #COPD  - due to cost stop taking pulmicort neb and xopenex neb  - your new regimen is atrovent neb 4 times daily., QVAR inhaler 2 puff bid, albuterol inhaler 2 puff as needed (take samples and show technique)  - use oxygen all 24 hours (we will Hold off on portable system due to cost issues)  # sleep apnea  - you probably have this  - have sleep study and fu with Dr. Vassie Loll or Dr Craige Cotta  #Smoking  - please quit  #CT lung nodule and node  - we will do CT chest in 6 months  #Followup  - 2 months or sooner if needed  - if new meds are costly please call us soon  - please see $ counsillor soon   OV 11/12/10: Followup a) COPD; b) smoking; c) lung nodule; and d) possible sleep apnea. COPD is stable. Using mdi and nebs. Still smoking.  Wants to do e-cigs and is asking infor about it. Had sleep study in May 2012: AHI 1.8, PLM 27 but denies restless legs. $ issues and did not fu with Dr Craige Cotta. Says she is finding it tough to pay bills for sleep apnea. Has not had CT chest as fu for nodule; cost issue again. Does not qualify for medicaid. Medicare few years away. Does not have private health care. Goes to [employer]  Past, Family and  Social: no change    #sleep study  - you just need to use oxygen with sleep  - you do not need cpap machine  #Smoking  - please quit smoking  - at this point research does not recommend e-cigarette  #COPD  - you use your nebulizers as scheduled  #lung nodule  - you need followup CT chest in next 3-6 months (with contrast)  - meet with my manager to find out cost esp for [facility] patients  #Followup  - 6 months  - flu shot today   OV 06/22/2012  Followup a) COPD; b) smoking; c) lung nodule;  Have not seen her since October 2012. She only now got her Medicare in April 2015 and therefore made this appointment. Overall she says she was stable but now for the past 3 weeks is having increased cough, wheezing, chest tightness, increased white color phlegm. She is not using her medications correctly. She is finding nebulizers to much of a hassle to use. She does use her Qvar but I'm not sure of her compliance. COPD cat score is 35.  In terms of smoking: She continues to smoke conventional tobacco. The office smells of tobacco. She's not sure she will be interested in quitting  In terms of lung nodule: It has been over 2 years since she had her last CT  scan of the chest. She never wanted to have a CT scan of the chest because she had to pay for it out of pocket. But now that she has Medicare she is willing to have the CT scan of the chest  rec #COPD flareup - You are having a COPD flare up right now -  take levaquin 500mg  once daily  X 5 days - Please Please take prednisone 40 mg x1 day, then 30 mg x1 day, then 20 mg x1 day, then 10 mg x1 day, and then 5 mg x1 day and stop  #COPD - Continue Qvar 80 mcg 2 puff 2 times a day - Try ANORO 1 puff once daily  - this medication is a trial only   - learn technique from my CMA  #Lung nodule  = have followup CT chest - 2 year followup  #Smoking  - please quit  #Followup  - full PFT in 5 weeks - return after ct chest in 5  weeks   06/30/2012 f/u ov/Wert still smoking Chief Complaint  Patient presents with  . Acute Visit    Pt states breathing is no better since visit here on 06/22/12. Still having alot of SOB and prod cough and mucus gets hung in her throat.   not using 02 daytime x after gets tired and sits down, has only tried the neb x one and it did help. Mucus is clear >>rx Dulera, stopped QVAR and Anoro  CT chest >previously seen right lung nodules are no longer  demonstrated. The third nodule is stable. Progressive mediastinal adenopathy.  Changes of COPD and chronic bronchitis  07/16/2012 Acute OV  Complains of increased SOB, and worsening LE edema x2 days Still smoking.  Wearing O2 2 l/m with act and At bedtime   Takes O2 off to smoke. Advised on smoking cessation and dangers of smoking Started on Dulera last ov , Anoro and QVAR were stopped. .  Was not able to start PPI and pepcid , wants an rx.  Wants something for anxiety. Ran out xanax , can not afford to go back to psychiatry.  No chest pain, syncope, fever, cough or n/v.      Current Medications, Allergies, Past Medical History, Past Surgical History, Family History, and Social History were reviewed in [employer] record.  ROS  The following are not active complaints unless bolded sore throat, dysphagia, dental problems, itching, sneezing,  nasal congestion or excess/ purulent secretions, ear ache,   fever, chills, sweats, unintended wt loss, pleuritic or exertional cp, hemoptysis,  orthopnea pnd or leg swelling, presyncope, palpitations, heartburn, abdominal pain, anorexia, nausea, vomiting, diarrhea  or change in bowel or urinary habits, change in stools or urine, dysuria,hematuria,  rash, arthralgias, visual complaints, headache, numbness weakness or ataxia or problems with walking or coordination,  change in mood/affect or memory.               Objective:   Physical Exam    Constitutional: She is oriented  to person, place, and time. She appears well-developed and well-nourished. No distress.       obese  HENT:  Head: Normocephalic and atraumatic.  Right Ear: External ear normal.  Left Ear: External ear normal.  Mouth/Throat: Oropharynx is clear and moist. No oropharyngeal exudate.  Eyes: Conjunctivae and EOM are normal. Pupils are equal, round, and reactive to light. Right eye exhibits no discharge. Left eye exhibits no discharge. No scleral icterus.  Neck: Normal range of motion. Neck  supple. No JVD present. No tracheal deviation present. No thyromegaly present.       Obese neck mallampatti class 3  Cardiovascular: Normal rate, regular rhythm, normal heart sounds and intact distal pulses.  Exam reveals no gallop and no friction rub.  No murmur heard. 1+ edema bilateral right >left , neg homans sign.  Pulmonary/Chest: Effort normal. No respiratory distress. She has no wheezes. She has no rales. She exhibits no tenderness.       Abdominal: Soft. Bowel sounds are normal. She exhibits no distension and no mass. There is no tenderness. There is no rebound and no guarding.  Musculoskeletal: Normal range of motion. She exhibits no edema and no tenderness.  Lymphadenopathy:    She has no cervical adenopathy.  Neurological: She is alert and oriented to person, place, and time. She has normal reflexes. No cranial nerve deficit. She exhibits normal muscle tone. Coordination normal.  Skin: Skin is warm and dry. No rash noted. She is not diaphoretic. No erythema. No pallor.  Psychiatric: She has a normal mood and affect. Her behavior is normal. Judgment and thought content normal.     06/25/12 CT chest s contrast 1. The previously seen right lung nodules are no longer  demonstrated. The third nodule is stable. This could be further  evaluated with a repeat chest CT without contrast in 9-12 months.  2. Progressive mediastinal adenopathy.  3. Changes of COPD and chronic bronchitis.  4. Linear scarring  at the left lung base.  5. Diffuse hepatic steatosis.      Assessment & Plan:

## 2012-07-17 LAB — BASIC METABOLIC PANEL
CO2: 26 mEq/L (ref 19–32)
Calcium: 9.1 mg/dL (ref 8.4–10.5)
GFR: 81.81 mL/min (ref >60.00)
Potassium: 4 mEq/L (ref 3.5–5.1)
Sodium: 142 mEq/L (ref 135–145)

## 2012-07-17 LAB — BRAIN NATRIURETIC PEPTIDE: Pro B Natriuretic peptide (BNP): 25 pg/mL (ref 0.0–100.0)

## 2012-07-20 ENCOUNTER — Encounter (INDEPENDENT_AMBULATORY_CARE_PROVIDER_SITE_OTHER): Payer: Medicare Other

## 2012-07-20 DIAGNOSIS — R0609 Other forms of dyspnea: Secondary | ICD-10-CM

## 2012-07-20 DIAGNOSIS — M7989 Other specified soft tissue disorders: Secondary | ICD-10-CM | POA: Diagnosis not present

## 2012-07-22 NOTE — Progress Notes (Signed)
Quick Note:  LMOM TCB x1. ______ 

## 2012-07-22 NOTE — Progress Notes (Signed)
Quick Note:  Patient returned call. Advised of lab results / recs as stated by TP. Pt verbalized understanding and denied any questions. ______ 

## 2012-07-23 ENCOUNTER — Telehealth: Payer: Self-pay | Admitting: Internal Medicine

## 2012-07-23 NOTE — Telephone Encounter (Signed)
Spoke with patient, patient states [name] called her I do not see anywhere in the chart where this is documented [name] please advise, thank you

## 2012-07-27 ENCOUNTER — Encounter: Payer: Self-pay | Admitting: Adult Health

## 2012-07-27 ENCOUNTER — Other Ambulatory Visit: Payer: Self-pay | Admitting: Internal Medicine

## 2012-07-28 ENCOUNTER — Encounter: Payer: Self-pay | Admitting: Internal Medicine

## 2012-07-28 ENCOUNTER — Ambulatory Visit (INDEPENDENT_AMBULATORY_CARE_PROVIDER_SITE_OTHER): Payer: Medicare Other | Admitting: Internal Medicine

## 2012-07-28 VITALS — BP 130/72 | HR 100 | Temp 98.4°F | Ht 63.25 in | Wt 223.0 lb

## 2012-07-28 DIAGNOSIS — F17200 Nicotine dependence, unspecified, uncomplicated: Secondary | ICD-10-CM | POA: Diagnosis not present

## 2012-07-28 DIAGNOSIS — J449 Chronic obstructive pulmonary disease, unspecified: Secondary | ICD-10-CM | POA: Diagnosis not present

## 2012-07-28 DIAGNOSIS — R609 Edema, unspecified: Secondary | ICD-10-CM | POA: Diagnosis not present

## 2012-07-28 DIAGNOSIS — J9610 Chronic respiratory failure, unspecified whether with hypoxia or hypercapnia: Secondary | ICD-10-CM

## 2012-07-28 DIAGNOSIS — R590 Localized enlarged lymph nodes: Secondary | ICD-10-CM

## 2012-07-28 DIAGNOSIS — R911 Solitary pulmonary nodule: Secondary | ICD-10-CM

## 2012-07-28 DIAGNOSIS — R599 Enlarged lymph nodes, unspecified: Secondary | ICD-10-CM

## 2012-07-28 DIAGNOSIS — Z720 Tobacco use: Secondary | ICD-10-CM

## 2012-07-28 LAB — PULMONARY FUNCTION TEST

## 2012-07-28 NOTE — Assessment & Plan Note (Addendum)
FEV-1 in 2008 was 63% with a diffusion capacity of 33%. In 2014, fev1 now 49% with dlco now 21%. She has progressive copd due to smoking. I counseled her strongly to quit smoking  PLAN #COPD - copd is stable but lungs are working at half of normal capacity  - continue dulera 2 puff twice dialy  - Please start spiriva 1 puff daily - take sample, script and show technique - continue oxygen  -re-referred you to pulmonary rehab -     #Followup  4 months or sooner if needed Cat score at followup

## 2012-07-28 NOTE — Telephone Encounter (Signed)
I had left a message for pt and spoken with her on 7.2.14 regarding her lab results

## 2012-07-28 NOTE — Assessment & Plan Note (Signed)
5mm RUL and RLL march 2012 CT with 1.4 cm Rt peribronchial node. In June 2014 these nodules are smaller/stable. Some mediastinal nodes are bigger with largest 12mm. Will repeat CT chest 6 months

## 2012-07-28 NOTE — Assessment & Plan Note (Signed)
She has progressive copd. Advised strongly to quit smoking. Will see if rehab can motivate her to quit

## 2012-07-28 NOTE — Progress Notes (Signed)
Subjective:    Patient ID: [name], female    DOB: [birth year], 67 y.o.   MRN: [id number]  HPI OV April 2012: Obese female hospitalized in March 2012 and now following. 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 OVerall doing much better since discharge. Using o2. No insurance. Unable to afford xopenex and pulmicort neb. Able to afford atroven neb. Wanting med change. Dysponea is at baseline class 3. Reportedly cut down on smoking to 1 cig/day. Mild pedal edema improving. Admits to snoring and excess day time somnolence   FEV-1 in 2008 was 63% with a diffusion capacity of 33%   #COPD  - due to cost stop taking pulmicort neb and xopenex neb  - your new regimen is atrovent neb 4 times daily., QVAR inhaler 2 puff bid, albuterol inhaler 2 puff as needed (take samples and show technique)  - use oxygen all 24 hours (we will Hold off on portable system due to cost issues)  # sleep apnea  - you probably have this  - have sleep study and fu with Dr. Vassie Loll or Dr Craige Cotta  #Smoking  - please quit  #CT lung nodule and node  - we will do CT chest in 6 months  #Followup  - 2 months or sooner if needed  - if new meds are costly please call us soon  - please see $ counsillor soon   OV 11/12/10: Followup a) COPD; b) smoking; c) lung nodule; and d) possible sleep apnea. COPD is stable. Using mdi and nebs. Still smoking.  Wants to do e-cigs and is asking infor about it. Had sleep study in May 2012: AHI 1.8, PLM 27 but denies restless legs. $ issues and did not fu with Dr Craige Cotta. Says she is finding it tough to pay bills for sleep apnea. Has not had CT chest as fu for nodule; cost issue again. Does not qualify for medicaid. Medicare few years away. Does not have private health care.  Goes to [employer]  Past, Family and Social: no change    #sleep study  - you just need to use oxygen with sleep  - you do not need cpap machine  #Smoking  - please quit smoking  - at this point research does not recommend e-cigarette  #COPD  - you use your nebulizers as scheduled  #lung nodule  - you need followup CT chest in next 3-6 months (with contrast)  - meet with my manager to find out cost esp for [facility] patients  #Followup  - 6 months  - flu shot today   OV 06/22/2012  Followup a) COPD; b) smoking; c) lung nodule;  Have not seen her since October 2012. She only now got her Medicare in April 2015 and therefore made this appointment. Overall she says she was stable but now for the past 3 weeks is having increased cough, wheezing, chest tightness, increased white color phlegm. She is not using her medications correctly. She is finding nebulizers to much of a hassle to use. She does use her Qvar but I'm not sure of her compliance. COPD cat score is 35.  In terms of smoking: She continues to smoke conventional tobacco. The office smells of tobacco. She's not sure she will be interested in quitting  In terms of lung nodule: It has been  over 2 years since she had her last CT scan of the chest. She never wanted to have a CT scan of the chest because she had to pay for it out of pocket. But now that she has Medicare she is willing to have the CT scan of the chest  rec #COPD flareup - You are having a COPD flare up right now -  take levaquin 500mg  once daily  X 5 days - Please Please take prednisone 40 mg x1 day, then 30 mg x1 day, then 20 mg x1 day, then 10 mg x1 day, and then 5 mg x1 day and stop  #COPD - Continue Qvar 80 mcg 2 puff 2 times a day - Try ANORO 1 puff once daily  - this medication is a trial only   - learn technique from my CMA  #Lung nodule  = have followup CT chest - 2 year followup  #Smoking  - please quit  #Followup  - full PFT in 5 weeks -  return after ct chest in 5 weeks   06/30/2012 f/u ov/Wert still smoking Chief Complaint  Patient presents with  . Acute Visit    Pt states breathing is no better since visit here on 06/22/12. Still having alot of SOB and prod cough and mucus gets hung in her throat.   not using 02 daytime x after gets tired and sits down, has only tried the neb x one and it did help. Mucus is clear >>rx Dulera, stopped QVAR and Anoro    CT chest >previously seen right lung nodules are no longer  demonstrated. The third nodule is stable. Progressive mediastinal adenopathy.  Changes of COPD and chronic bronchitis  07/16/2012 Acute OV  Complains of increased SOB, and worsening LE edema x2 days Still smoking.  Wearing O2 2 l/m with act and At bedtime   Takes O2 off to smoke. Advised on smoking cessation and dangers of smoking Started on Dulera last ov , Anoro and QVAR were stopped. .  Was not able to start PPI and pepcid , wants an rx.  Wants something for anxiety. Ran out xanax , can not afford to go back to psychiatry.  No chest pain, syncope, fever, cough or n/v.    REC Continue on Dulera 2 puffs Twice daily   Begin Prilosec 20mg  daily before meal  Begin Pepcid 20mg  At bedtime   Xanax 0.5mg  Twice daily  As needed  Anxiety - will need to get refills thru family MD  NO SMOKING- at all especially in home where oxygen tanks/machine is present.  Keep legs elevated.  Take Lasix 20mg  2 tabs daily for 3 days then 1 tab daily  We are setting you up for an ultrasound of your right leg .  Please contact office for sooner follow up if symptoms do not improve or worsen or seek emergency care  follow up Dr. Marchelle Gearing in 2 weeks and As neede  OV 07/28/2012  FU  1) Lung nodule : had 2 year fu CT - see below. I personally reviewed this scan  Ct chest 06/25/12 IMPRESSION:  1. The previously seen right lung nodules are no longer  demonstrated. The third nodule is stable. This could be further  evaluated with a  repeat chest CT without contrast in 9-12 months.  2. Progressive mediastinal adenopathy. - largest to me looks48mm 3. Changes of COPD and chronic bronchitis.  4. Linear scarring at the left lung base.  5. Diffuse hepatic steatosis.  Original Report  Authenticated By: Beckie Salts, M.D   2) COPD - stable diseaes. COPD Cat score is 23 and reflects significant symptoms. Says rehab still has not called her. She is interested. PFTs 78/14 shows gold stage 3 copd with  fev1 1.12L/49%, FVC 2.01L/70% - post bd parameters.  No bd response, Ratio 60/77%. TLC 87%, DLCO 5/21%   3) new RLE pedal edema:   Duplex LE - 07/21/12 - negative. BNP 07/16/12 - 25 and bmet normal. Apparently Dr Sherene Sires has given her lasix which is helping. She is frustrated she does not know etiology. She has no PCP    CAT COPD Symptom & Quality of Life Score (GSK trademark) 0 is no burden. 5 is highest burden 07/28/2012   Never Cough -> Cough all the time 2  No phlegm in chest -> Chest is full of phlegm 1  No chest tightness -> Chest feels very tight 2  No dyspnea for 1 flight stairs/hill -> Very dyspneic for 1 flight of stairs 5  No limitations for ADL at home -> Very limited with ADL at home 3  Confident leaving home -> Not at all confident leaving home 3  Sleep soundly -> Do not sleep soundly because of lung condition 5  Lots of Energy -> No energy at all 2  TOTAL Score (max 40)  23    Past, Family, Social reviewed: she has no PCP    Review of Systems  Constitutional: Negative for fever and unexpected weight change.  HENT: Negative for ear pain, nosebleeds, congestion, sore throat, rhinorrhea, sneezing, trouble swallowing, dental problem, postnasal drip and sinus pressure.   Eyes: Negative for redness and itching.  Respiratory: Positive for shortness of breath. Negative for cough, chest tightness and wheezing.   Cardiovascular: Positive for leg swelling. Negative for palpitations.  Gastrointestinal: Negative for nausea and  vomiting.  Genitourinary: Negative for dysuria.  Musculoskeletal: Negative for joint swelling.  Skin: Negative for rash.  Neurological: Negative for headaches.  Hematological: Does not bruise/bleed easily.  Psychiatric/Behavioral: Negative for dysphoric mood. The patient is not nervous/anxious.    Current outpatient prescriptions:albuterol (PROVENTIL HFA;VENTOLIN HFA) 108 (90 BASE) MCG/ACT inhaler, Inhale 2 puffs into the lungs every 6 (six) hours as needed.  , Disp: , Rfl: ;  albuterol (PROVENTIL) (2.5 MG/3ML) 0.083% nebulizer solution, Take 2.5 mg by nebulization every 6 (six) hours as needed for wheezing., Disp: , Rfl:  ALPRAZolam (XANAX) 0.5 MG tablet, Take 1 tablet (0.5 mg total) by mouth 2 (two) times daily as needed for anxiety., Disp: 60 tablet, Rfl: 0;  amLODipine (NORVASC) 10 MG tablet, Take 10 mg by mouth daily.  , Disp: , Rfl: ;  cloNIDine (CATAPRES) 0.1 MG tablet, Take 0.1 mg by mouth 2 (two) times daily.  , Disp: , Rfl: ;  famotidine (PEPCID) 20 MG tablet, Take 1 tablet (20 mg total) by mouth at bedtime., Disp: 30 tablet, Rfl: 5 fluticasone (FLONASE) 50 MCG/ACT nasal spray, Place 2 sprays into the nose as needed. , Disp: , Rfl: ;  furosemide (LASIX) 20 MG tablet, TAKE 1 TABLET BY MOUTH DAILY., Disp: 30 tablet, Rfl: 1;  loratadine (CLARITIN) 10 MG tablet, Take 10 mg by mouth as needed. , Disp: , Rfl: ;  mometasone-formoterol (DULERA) 200-5 MCG/ACT AERO, Take 2 puffs first thing in am and then another 2 puffs about 12 hours later., Disp: 1 Inhaler, Rfl: 0 omeprazole (PRILOSEC) 20 MG capsule, Take 1 capsule (20 mg total) by mouth daily., Disp: 30 capsule, Rfl: 5  Objective:   Physical Exam  Constitutional: She is oriented to person, place, and time. She appears well-developed and well-nourished. No distress.       obese  HENT:  Head: Normocephalic and atraumatic.  Right Ear: External ear normal.  Left Ear: External ear normal.  Mouth/Throat: Oropharynx is clear and moist. No  oropharyngeal exudate.  Eyes: Conjunctivae and EOM are normal. Pupils are equal, round, and reactive to light. Right eye exhibits no discharge. Left eye exhibits no discharge. No scleral icterus.  Neck: Normal range of motion. Neck supple. No JVD present. No tracheal deviation present. No thyromegaly present.       Obese neck mallampatti class 3  Cardiovascular: Normal rate, regular rhythm, normal heart sounds and intact distal pulses.  Exam reveals no gallop and no friction rub.  No murmur heard. 1+ edema bilateral right >left , neg homans sign.  Pulmonary/Chest: Effort normal. No respiratory distress. She has no wheezes. She has no rales. She exhibits no tenderness.       Abdominal: Soft. Bowel sounds are normal. She exhibits no distension and no mass. There is no tenderness. There is no rebound and no guarding.  Musculoskeletal: Normal range of motion. She exhibits no edema and no tenderness.  Lymphadenopathy:    She has no cervical adenopathy.  Neurological: She is alert and oriented to person, place, and time. She has normal reflexes. No cranial nerve deficit. She exhibits normal muscle tone. Coordination normal.  Skin: Skin is warm and dry. No rash noted. She is not diaphoretic. No erythema. No pallor.  Psychiatric: She has a normal mood and affect. Her behavior is normal. Judgment and thought content normal.         Assessment & Plan:

## 2012-07-28 NOTE — Progress Notes (Signed)
Quick Note:  Pt aware ______ 

## 2012-07-28 NOTE — Patient Instructions (Addendum)
#  LEg swelling  - not sure why. Get primary car doctor; referral done  #COPD - copd is stable but lungs are working at half of normal capacity  - continue dulera 2 puff twice dialy  - Please start spiriva 1 puff daily - take sample, script and show technique - continue oxygen  -re-referred you to pulmonary rehab -   #lung nodule  - do CT chest in 9 months    #Followup  4 months or sooner if needed Cat score at followup

## 2012-07-28 NOTE — Progress Notes (Signed)
PFT done today. 

## 2012-07-29 NOTE — Telephone Encounter (Signed)
Spoke with patient at follow up with MR 7.8.14 Advised of negative doppler results Nothing further needed; will sign off.

## 2012-08-07 ENCOUNTER — Telehealth: Payer: Self-pay | Admitting: Internal Medicine

## 2012-08-07 MED ORDER — TIOTROPIUM BROMIDE MONOHYDRATE 18 MCG IN CAPS: 18.0000 ug | ORAL_CAPSULE | Freq: Every day | RESPIRATORY_TRACT | Status: DC

## 2012-08-07 MED ORDER — MOMETASONE FURO-FORMOTEROL FUM 200-5 MCG/ACT IN AERO: INHALATION_SPRAY | RESPIRATORY_TRACT | Status: DC

## 2012-08-07 NOTE — Telephone Encounter (Signed)
Upcoming appt 11/25/2012 Rx sent to [facility]. for refills. Dulera 2 puff bid x4 refills and Spiriva #30 x 4 refills

## 2012-08-16 ENCOUNTER — Other Ambulatory Visit: Payer: Self-pay | Admitting: Adult Health

## 2012-08-17 ENCOUNTER — Telehealth: Payer: Self-pay | Admitting: Internal Medicine

## 2012-08-17 MED ORDER — LOSARTAN POTASSIUM 25 MG PO TABS: 25.0000 mg | ORAL_TABLET | Freq: Every day | ORAL | Status: DC

## 2012-08-17 NOTE — Telephone Encounter (Signed)
I spoke with pt and is aware of recs. RX for losartan has been sent. She is seeing [facility]. Will forward to MR as an [place]

## 2012-08-17 NOTE — Telephone Encounter (Signed)
Last OV on 07/28/12. Pt has an appt with a new PCP on 08/26/12, this will be her first appt with the,. Pt called today c/o increased swelling in her feet and ankles. Pt states on the right side the swelling has moved up her leg and it is swollen to her knee. Pt states in the morning the swelling is gone when she first gets up, but shortly after in begins to increase and continue to do so through the day. Pt denies any increase in SOB, no heat or redness on her legs. Pt is currently on lasix 20 mg. Pt advised to eliminate salt in her diet, elevate legs as much as possible.  Please advise. Carron Curie, CMA NKDA

## 2012-08-17 NOTE — Telephone Encounter (Signed)
Her amlodipine could be part of the problem. So if she is taking it for bp, stop it and take losartan 25mg  daily instead  Who wil be her new pcp ?  Dr. Kalman Shan, M.D., [facility].C.P Pulmonary and Critical Care Medicine Staff Physician [facility] Pager: [phone], If no answer or between  15:00h - 7:00h: call [phone]  08/17/2012 1:17 PM

## 2012-08-17 NOTE — Telephone Encounter (Signed)
THanks, I will copy REgina Baity  Dr. Kalman Shan, M.D., [facility].C.P Pulmonary and Critical Care Medicine Staff Physician [facility] Pager: [phone], If no answer or between  15:00h - 7:00h: call [phone]  08/17/2012 2:28 PM

## 2012-08-20 ENCOUNTER — Encounter: Payer: Self-pay | Admitting: Internal Medicine

## 2012-08-26 ENCOUNTER — Other Ambulatory Visit (INDEPENDENT_AMBULATORY_CARE_PROVIDER_SITE_OTHER): Payer: Medicare Other

## 2012-08-26 ENCOUNTER — Encounter: Payer: Self-pay | Admitting: Internal Medicine

## 2012-08-26 ENCOUNTER — Ambulatory Visit (INDEPENDENT_AMBULATORY_CARE_PROVIDER_SITE_OTHER)
Admission: RE | Admit: 2012-08-26 | Discharge: 2012-08-26 | Disposition: A | Discharge status: Home / Self Care | Payer: Medicare Other | Source: Ambulatory Visit | Attending: Internal Medicine | Admitting: Internal Medicine

## 2012-08-26 ENCOUNTER — Ambulatory Visit (INDEPENDENT_AMBULATORY_CARE_PROVIDER_SITE_OTHER): Payer: Medicare Other | Admitting: Internal Medicine

## 2012-08-26 ENCOUNTER — Other Ambulatory Visit: Payer: Self-pay

## 2012-08-26 VITALS — BP 108/70 | HR 100 | Temp 98.7°F | Ht 64.0 in | Wt 227.0 lb

## 2012-08-26 DIAGNOSIS — I10 Essential (primary) hypertension: Secondary | ICD-10-CM | POA: Diagnosis not present

## 2012-08-26 DIAGNOSIS — J449 Chronic obstructive pulmonary disease, unspecified: Secondary | ICD-10-CM

## 2012-08-26 DIAGNOSIS — J438 Other emphysema: Secondary | ICD-10-CM | POA: Diagnosis not present

## 2012-08-26 DIAGNOSIS — R5383 Other fatigue: Secondary | ICD-10-CM

## 2012-08-26 DIAGNOSIS — R609 Edema, unspecified: Secondary | ICD-10-CM

## 2012-08-26 DIAGNOSIS — E669 Obesity, unspecified: Secondary | ICD-10-CM

## 2012-08-26 DIAGNOSIS — I5032 Chronic diastolic (congestive) heart failure: Secondary | ICD-10-CM

## 2012-08-26 DIAGNOSIS — R5381 Other malaise: Secondary | ICD-10-CM | POA: Diagnosis not present

## 2012-08-26 DIAGNOSIS — J4489 Other specified chronic obstructive pulmonary disease: Secondary | ICD-10-CM

## 2012-08-26 DIAGNOSIS — E785 Hyperlipidemia, unspecified: Secondary | ICD-10-CM

## 2012-08-26 LAB — CBC
Platelets: 277 10*3/uL (ref 150.0–400.0)
RBC: 4.5 Mil/uL (ref 3.87–5.11)
WBC: 5.6 10*3/uL (ref 4.5–10.5)

## 2012-08-26 LAB — COMPREHENSIVE METABOLIC PANEL
Albumin: 3.6 g/dL (ref 3.5–5.2)
BUN: 14 mg/dL (ref 6–23)
Calcium: 9.2 mg/dL (ref 8.4–10.5)
Chloride: 108 mEq/L (ref 96–112)
Glucose, Bld: 107 mg/dL — ABNORMAL HIGH (ref 70–99)
Potassium: 3.5 mEq/L (ref 3.5–5.1)
Total Protein: 6.7 g/dL (ref 6.0–8.3)

## 2012-08-26 LAB — LIPID PANEL
Cholesterol: 187 mg/dL (ref 0–200)
LDL Cholesterol: 132 mg/dL — ABNORMAL HIGH (ref 0–99)

## 2012-08-26 LAB — TSH: TSH: 2.8 u[IU]/mL (ref 0.35–5.50)

## 2012-08-26 MED ORDER — CLONIDINE HCL 0.1 MG PO TABS: 0.1000 mg | ORAL_TABLET | Freq: Two times a day (BID) | ORAL | Status: DC

## 2012-08-26 MED ORDER — ALPRAZOLAM 0.5 MG PO TABS: 0.5000 mg | ORAL_TABLET | Freq: Two times a day (BID) | ORAL | Status: DC | PRN

## 2012-08-26 MED ORDER — FUROSEMIDE 40 MG PO TABS: 40.0000 mg | ORAL_TABLET | Freq: Every day | ORAL | Status: DC

## 2012-08-26 NOTE — Assessment & Plan Note (Signed)
Will recheck lipid profile today Continue to work on diet and exercise. 

## 2012-08-26 NOTE — Patient Instructions (Signed)

## 2012-08-26 NOTE — Assessment & Plan Note (Signed)
On 2L of oxygen Worse over the last month Will check chest xray to assess for interstitial edema May need to increase lasix

## 2012-08-26 NOTE — Progress Notes (Signed)
HPI  Pt presents to the clinic today to establish care. She was being seen at [facility] until they closed. She has not seen a PCP since that time. She does have some concerns today about the swelling in her legs. This is a recurrent issue for her. Dr. Marchelle Gearing thought it may have been related to her amlodipine. He stopped that and switched her to losartan but that has not helped. The swelling gets worse throughout the day. She is on lasix and that has helped.  Flu: 2012 Tetanus: unsure of date Pneumovax: 2012 Zostovax: never Pap smear: 2005- history of cervical cancer 1982 Mammogram: 2014 Bone Density: 2014-osteoperosis- no treatment Colonoscopy: never Ey Doctor: as needed Dentist: noTenny Craw  Past Medical History  Diagnosis Date  . Hypertension   . COPD (chronic obstructive pulmonary disease)      FEV-1 in 2008 was 63% with a diffusion capacity of 33%.   . Tobacco abuse      Smokes one pack a day since age 37.  . Diastolic dysfunction   . History of cervical cancer   . Snoring disorder   . Pulmonary nodule march 2012    5mm RUL and RLL march 2012 CT  . 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 Bronchiectasis march 2012    On CT chest. RUL. Mild  . Medical non-compliance   . COPD exacerbation     march 2012    Current Outpatient Prescriptions  Medication Sig Dispense Refill  . albuterol (PROVENTIL HFA;VENTOLIN HFA) 108 (90 BASE) MCG/ACT inhaler Inhale 2 puffs into the lungs every 6 (six) hours as needed.        Marland Kitchen albuterol (PROVENTIL) (2.5 MG/3ML) 0.083% nebulizer solution Take 2.5 mg by nebulization every 6 (six) hours as needed for wheezing.      Marland Kitchen ALPRAZolam (XANAX) 0.5 MG tablet Take 1 tablet (0.5 mg total) by mouth 2 (two) times daily as needed for anxiety.  60 tablet  0  . amLODipine (NORVASC) 10 MG tablet Take 10 mg by mouth daily.        . cloNIDine (CATAPRES) 0.1 MG tablet Take 0.1 mg by mouth 2 (two) times daily.        .  famotidine (PEPCID) 20 MG tablet Take 1 tablet (20 mg total) by mouth at bedtime.  30 tablet  5  . fluticasone (FLONASE) 50 MCG/ACT nasal spray Place 2 sprays into the nose as needed.       . furosemide (LASIX) 20 MG tablet TAKE 1 TABLET BY MOUTH DAILY.  30 tablet  1  . loratadine (CLARITIN) 10 MG tablet Take 10 mg by mouth as needed.       Marland Kitchen losartan (COZAAR) 25 MG tablet Take 1 tablet (25 mg total) by mouth daily.  30 tablet  0  . mometasone-formoterol (DULERA) 200-5 MCG/ACT AERO Take 2 puffs first thing in am and then another 2 puffs about 12 hours later.  1 Inhaler  4  . omeprazole (PRILOSEC) 20 MG capsule Take 1 capsule (20 mg total) by mouth daily.  30 capsule  5  . tiotropium (SPIRIVA) 18 MCG inhalation capsule Place 1 capsule (18 mcg total) into inhaler and inhale daily.  30 capsule  4   No current facility-administered medications for this visit.    No Known Allergies  Family History  Problem Relation Age of Onset  . Other Father     MVA  . Esophageal  cancer Mother     History   Social History  . Marital Status: Married    Spouse Name: N/A    Number of Children: N/A  . 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 Smoking status: Current Every Day Smoker -- 0.50 packs/day for 44 years    Types: Cigarettes  . Smokeless tobacco: Never Used     Comment: down to 6 cigs per day, wants to do e cigs; I cautioned against that  . Alcohol Use: Yes     Comment: occasional  . Drug Use: No  . Sexually Active: Not on file   Other Topics Concern  . Not on file   Social History Narrative   Married but recently separated from husband    ROS:  Constitutional: Pt reports fatigue. Denies fever, malaise, headache or abrupt weight changes.  HEENT: Denies eye pain, eye redness, ear pain, ringing in the ears, wax buildup, runny nose, nasal congestion, bloody nose, or sore throat. Respiratory: Pt reports shortness of  breath. Denies difficulty breathing, cough or sputum production.   Cardiovascular: Denies chest pain, chest tightness, palpitations or swelling in the hands or feet.  Gastrointestinal: Denies abdominal pain, bloating, constipation, diarrhea or blood in the stool.  GU: Denies frequency, urgency, pain with urination, blood in urine, odor or discharge. Musculoskeletal: Denies decrease in range of motion, difficulty with gait, muscle pain or joint pain and swelling.  Skin: Denies redness, rashes, lesions or ulcercations.  Neurological: Denies dizziness, difficulty with memory, difficulty with speech or problems with balance and coordination.   No other specific complaints in a complete review of systems (except as listed in HPI above).  PE:  BP 108/70  Pulse 112  Temp(Src) 98.7 F (37.1 C) (Oral)  Ht 5\' 4"  (1.626 m)  Wt 227 lb (102.967 kg)  BMI 38.95 kg/m2  SpO2 82% Wt Readings from Last 3 Encounters:  08/26/12 227 lb (102.967 kg)  07/28/12 223 lb (101.152 kg)  07/16/12 225 lb 9.6 oz (102.331 kg)    General: Appears her stated age, obese but well developed, well nourished in NAD. HEENT: Head: normal shape and size; Eyes: sclera white, no icterus, conjunctiva pink, PERRLA and EOMs intact; Ears: Tm's gray and intact, normal light reflex; Nose: mucosa pink and moist, septum midline; Throat/Mouth: Teeth present, mucosa pink and moist, no lesions or ulcerations noted.  Neck: Normal range of motion. Neck supple, trachea midline. No massses, lumps or thyromegaly present.  Cardiovascular: Normal rate and rhythm. S1,S2 noted.  No murmur, rubs or gallops noted. No JVD , 2 + pitting BLE. No carotid bruits noted. Pulmonary/Chest: Normal effort and bilateral rales noted. No respiratory distress. No wheezes or ronchi noted.  Abdomen: Soft and nontender. Normal bowel sounds, no bruits noted. No distention or masses noted. Liver, spleen and kidneys non palpable. Musculoskeletal: Normal range of motion.  No signs of joint swelling. No difficulty with gait.  Neurological: Alert and oriented. Cranial nerves II-XII intact. Coordination normal. +DTRs bilaterally. Psychiatric: Mood and affect normal. Behavior is normal. Judgment and thought content normal.     BMET    Component Value Date/Time   NA 142 07/16/2012 1728   K 4.0 07/16/2012 1728   CL 109 07/16/2012 1728   CO2 26 07/16/2012 1728   GLUCOSE 107* 07/16/2012 1728   BUN 14 07/16/2012 1728   CREATININE 0.9 07/16/2012 1728   CALCIUM 9.1 07/16/2012 1728  GFRNONAA >60 04/10/2010 0555   GFRAA  Value: >60        The eGFR has been calculated using the MDRD equation. This calculation has not been validated in all clinical situations. eGFR's persistently <60 mL/min signify possible Chronic Kidney Disease. 04/10/2010 0555    Lipid Panel     Component Value Date/Time   CHOL  Value: 170        ATP III CLASSIFICATION:  <200     mg/dL   Desirable  161-096  mg/dL   Borderline High  >=045    mg/dL   High        04/29/8117 0655   TRIG 55 04/05/2010 0655   HDL 38* 04/05/2010 0655   CHOLHDL 4.5 04/05/2010 0655   VLDL 11 04/05/2010 0655   LDLCALC  Value: 121        Total Cholesterol/HDL:CHD Risk Coronary Heart Disease Risk Table                     Men   Women  1/2 Average Risk   3.4   3.3  Average Risk       5.0   4.4  2 X Average Risk   9.6   7.1  3 X Average Risk  23.4   11.0        Use the calculated Patient Ratio above and the CHD Risk Table to determine the patient's CHD Risk.        ATP III CLASSIFICATION (LDL):  <100     mg/dL   Optimal  147-829  mg/dL   Near or Above                    Optimal  130-159  mg/dL   Borderline  562-130  mg/dL   High  >865     mg/dL   Very High* 7/84/6962 0655    CBC    Component Value Date/Time   WBC 12.8* 04/10/2010 0555   RBC 6.03* 04/10/2010 0555   HGB 18.4* 04/10/2010 0555   HCT 55.5* 04/10/2010 0555   PLT 268 04/10/2010 0555   MCV 92.0 04/10/2010 0555   MCH 30.5 04/10/2010 0555   MCHC 33.2 04/10/2010 0555   RDW 14.2  04/10/2010 0555   LYMPHSABS 0.9 04/09/2010 0408   MONOABS 0.5 04/09/2010 0408   EOSABS 0.0 04/09/2010 0408   BASOSABS 0.0 04/09/2010 0408    Hgb A1C No results found for this basename: HGBA1C     Assessment and Plan:  Health maintenance:  Will schedule you for your colonoscopy Encouraged pt to visit eye doctor yearly Will find out about your tetanus vaccine Consider getting the zostavax

## 2012-08-26 NOTE — Assessment & Plan Note (Signed)
Well controlled Continue current therapy 

## 2012-08-26 NOTE — Assessment & Plan Note (Signed)
Likely related to CHF Increase lasix to 40 mg daily

## 2012-08-31 ENCOUNTER — Other Ambulatory Visit: Payer: Self-pay | Admitting: Internal Medicine

## 2012-08-31 ENCOUNTER — Telehealth: Payer: Self-pay | Admitting: *Deleted

## 2012-08-31 MED ORDER — SIMVASTATIN 10 MG PO TABS: 10.0000 mg | ORAL_TABLET | Freq: Every day | ORAL | Status: DC

## 2012-08-31 NOTE — Telephone Encounter (Signed)
Spoke with pt. She states she is taking 40 mg once daily. 

## 2012-08-31 NOTE — Telephone Encounter (Signed)
Have her try to wear compression hose during the day to see if this helps

## 2012-08-31 NOTE — Telephone Encounter (Signed)
Left detailed msg on VM of message.

## 2012-08-31 NOTE — Telephone Encounter (Signed)
Pt called requesting lab results.  Advised as per result note.  Pt agrees to start Zocor.  Further states right leg and foot is still swollen there has been no change in size. Please advise

## 2012-08-31 NOTE — Telephone Encounter (Signed)
Spoke with pt. She states she is taking 40 mg once daily.

## 2012-08-31 NOTE — Telephone Encounter (Signed)
Zocor called into pharmacy. Did she increase her lasix?

## 2012-09-07 ENCOUNTER — Telehealth: Payer: Self-pay | Admitting: Internal Medicine

## 2012-09-07 NOTE — Telephone Encounter (Signed)
Left message for pt pul rehab will be calling her in the next few days Tobe Sos

## 2012-09-14 ENCOUNTER — Encounter (HOSPITAL_COMMUNITY)
Admission: RE | Admit: 2012-09-14 | Discharge: 2012-09-14 | Disposition: A | Discharge status: Home / Self Care | Payer: Medicare Other | Source: Ambulatory Visit | Attending: Internal Medicine | Admitting: Internal Medicine

## 2012-09-14 ENCOUNTER — Encounter (HOSPITAL_COMMUNITY): Payer: Self-pay

## 2012-09-14 ENCOUNTER — Other Ambulatory Visit: Payer: Self-pay | Admitting: Internal Medicine

## 2012-09-14 HISTORY — DX: Cardiac arrhythmia, unspecified: I49.9

## 2012-09-14 HISTORY — DX: Hyperlipidemia, unspecified: E78.5

## 2012-09-14 HISTORY — DX: Heart failure, unspecified: I50.9

## 2012-09-14 NOTE — Progress Notes (Signed)
[name] 67 y.o. female                                             Initial Psychosocial Assessment         Patient psychosocial assessment reveals her son is currently living with her since her recent health decline.  She has been unable to prepare meals and also do her housework.  She has worked for many years and part time until her illness became worse.  She mostly is on the computer playing games because she is too fatigued to get out for social activities.  She also has not been driving due to an episode of dyspnea that required stopping her car and having someone passing call 911. She is very afraid now to drive.  She does not exhibit signs of depression and has friends and family that she interacts with.  Her main level of stress is her health, fatigue, and shortness of breath.  She shows positive coping skills and a positive outlook.  Offered emotional support and reassurance.  We will monitor and evaluate her progress toward psychosocial goals of improved management of stress, coping skill and returning to meaningful activities that improve her  QOL and are attainable with this patients lung disease  [name], RN 09/14/2012 6:15 PM

## 2012-09-14 NOTE — Progress Notes (Signed)
[name] came in today for orientation to Pulmonary Rehab.  She was dyspneic on arrival and  on arrival, on room air.  Complains of fatigue.  Skin warn and dry,  oriented to time and place.  She is talkative, mood and behavior are normal.   Heart rate regular, recent visit to clinic she was told she had heart failure.  She has bilateral pitting edema to mid calf.  Distal pulses are faint. Her balance and gait are normal, grip strength good and she denies and joint or back pain.  Oriented to program guidelines and expectations.  She has set goals to become more knowledgeable about her condition and to be able to increase her activities at home, get back to driving and getting out more.  She is a current smoker, she states 5-6 cigarettes per day.  20 min spent with patient on smoking cessation and options we can incorporate into her rehab program. Her vital sign check today revealed oxygen saturations of 83-34 % on room air.  She did not have her portable oxygen.  She was started on oxygen here @ of 2 CL and increased to 4 to obtain sats at rest of 93-94%.  We also talked about and handout given for low sodium diet. She is receptive.  She does not have scales for daily weights and we will follow up to assist her with this issue.  She was advised to use her oxygen continuously at home.  Walk test is delayed until her first day of exercise on 09/07/12.  Demonstration and practice of PLB using pulse oximeter.  Patient able to return demonstration satisfactorily.  We will look forward to assisting this nice lady in accomplishing her goals and improving her quality of life.   3:30 pm to 4:50 pm  [name] RN

## 2012-09-15 ENCOUNTER — Telehealth: Payer: Self-pay | Admitting: Internal Medicine

## 2012-09-15 MED ORDER — LOSARTAN POTASSIUM 25 MG PO TABS: 25.0000 mg | ORAL_TABLET | Freq: Every day | ORAL | Status: DC

## 2012-09-15 NOTE — Telephone Encounter (Signed)
Spoke with pt She is calling to let MR know that she is enrolling in a clinical trial for COPD and will be taking performist She states that she is also starting pulmonary rehab tomorrow and they have faxed an order to MR to sign for her to obtain compression stockings Will forward to MR as Lorain Childes

## 2012-09-15 NOTE — Telephone Encounter (Signed)
LMTCB

## 2012-09-15 NOTE — Telephone Encounter (Signed)
Pt returned call. [name]  

## 2012-09-16 NOTE — Telephone Encounter (Signed)
Ok thanks  Dr. Kalman Shan, M.D., [facility]C.P Pulmonary and Critical Care Medicine Staff Physician [facility] Pulmonary and Critical Care Pager: [phone], If no answer or between  15:00h - 7:00h: call [phone]  09/16/2012 12:03 PM

## 2012-09-17 ENCOUNTER — Telehealth: Payer: Self-pay | Admitting: Internal Medicine

## 2012-09-17 ENCOUNTER — Inpatient Hospital Stay (HOSPITAL_COMMUNITY): Admission: RE | Admit: 2012-09-17 | Payer: Medicare Other | Source: Ambulatory Visit

## 2012-09-17 DIAGNOSIS — R609 Edema, unspecified: Secondary | ICD-10-CM

## 2012-09-17 NOTE — Telephone Encounter (Signed)
Dr. Marchelle Gearing have you received an order on this pt for her compression socks. This was suppose to be faxed to [place] according 09/15/12 phone note? thanks

## 2012-09-17 NOTE — Telephone Encounter (Signed)
As i am the only one in triage this AM will forward to jennifer to see if this has been received

## 2012-09-17 NOTE — Telephone Encounter (Signed)
There are tons of paper on my desk that will take me several days to go through. I have no idea therefore. If some one can got rhough it and give it to me this pm I can sign it  Dr. Kalman Shan, M.D., [facility].C.P Pulmonary and Critical Care Medicine Staff Physician [facility] Pulmonary and Critical Care Pager: [phone], If no answer or between  15:00h - 7:00h: call [phone]  09/17/2012 11:27 AM

## 2012-09-18 NOTE — Telephone Encounter (Signed)
Pt is very mad that no nurse has called her yet. She wants this taken care of now. She says "its now 12:30 on Friday and nothing has been done."

## 2012-09-18 NOTE — Telephone Encounter (Signed)
I spoke with pt and she is wanting to know the status of this form for MR to sign. There are no forms in MR look at or in his papers. According to leigh pt will have to be measured for compression stockings to get the right size. Usually through medical supply store and pt will need RX for this as well.   I called Melissa to see if she can get this through [facility]. Melissa states they do have compression stockings and pt can go to [address] office. I have placed order and they will work with pt getting size for her.   I called pt and made her aware. She will go there. Order placed.

## 2012-09-22 ENCOUNTER — Encounter (HOSPITAL_COMMUNITY)
Admission: RE | Admit: 2012-09-22 | Discharge: 2012-09-22 | Disposition: A | Discharge status: Home / Self Care | Payer: Medicare Other | Source: Ambulatory Visit | Attending: Internal Medicine | Admitting: Internal Medicine

## 2012-09-22 ENCOUNTER — Inpatient Hospital Stay (HOSPITAL_COMMUNITY): Admission: RE | Admit: 2012-09-22 | Payer: Medicare Other | Source: Ambulatory Visit

## 2012-09-22 DIAGNOSIS — J4489 Other specified chronic obstructive pulmonary disease: Secondary | ICD-10-CM | POA: Insufficient documentation

## 2012-09-22 DIAGNOSIS — J9610 Chronic respiratory failure, unspecified whether with hypoxia or hypercapnia: Secondary | ICD-10-CM | POA: Diagnosis not present

## 2012-09-22 DIAGNOSIS — I5032 Chronic diastolic (congestive) heart failure: Secondary | ICD-10-CM | POA: Diagnosis not present

## 2012-09-22 DIAGNOSIS — I509 Heart failure, unspecified: Secondary | ICD-10-CM | POA: Insufficient documentation

## 2012-09-22 DIAGNOSIS — J449 Chronic obstructive pulmonary disease, unspecified: Secondary | ICD-10-CM | POA: Diagnosis not present

## 2012-09-22 DIAGNOSIS — Z5189 Encounter for other specified aftercare: Secondary | ICD-10-CM | POA: Insufficient documentation

## 2012-09-22 DIAGNOSIS — R918 Other nonspecific abnormal finding of lung field: Secondary | ICD-10-CM | POA: Insufficient documentation

## 2012-09-23 ENCOUNTER — Telehealth: Payer: Self-pay | Admitting: Internal Medicine

## 2012-09-23 NOTE — Telephone Encounter (Signed)
I spoke with pt and she stated she is going on trip to [place] and needs her airline form filled out. She will fax this to [place] 10 days prior to her flight in October. Nothing further needed

## 2012-09-24 ENCOUNTER — Other Ambulatory Visit: Payer: Self-pay | Admitting: Internal Medicine

## 2012-09-24 ENCOUNTER — Encounter (HOSPITAL_COMMUNITY)
Admission: RE | Admit: 2012-09-24 | Discharge: 2012-09-24 | Disposition: A | Discharge status: Home / Self Care | Payer: Medicare Other | Source: Ambulatory Visit | Attending: Internal Medicine | Admitting: Internal Medicine

## 2012-09-24 ENCOUNTER — Ambulatory Visit (HOSPITAL_COMMUNITY): Payer: Medicare Other

## 2012-09-25 ENCOUNTER — Other Ambulatory Visit: Payer: Self-pay

## 2012-09-29 ENCOUNTER — Encounter (HOSPITAL_COMMUNITY)
Admission: RE | Admit: 2012-09-29 | Discharge: 2012-09-29 | Disposition: A | Discharge status: Home / Self Care | Payer: Medicare Other | Source: Ambulatory Visit | Attending: Internal Medicine | Admitting: Internal Medicine

## 2012-09-29 ENCOUNTER — Ambulatory Visit (HOSPITAL_COMMUNITY): Payer: Medicare Other

## 2012-09-29 NOTE — Progress Notes (Signed)
When Beva arrived today she was very anxious and short of breath.  I spent 10 minutes with her working on relaxation, concentrating on only her breathing and blocking everything out .   This helped tremendously, she was able to complete 3, 15 minute exercise sessions easier than ever before.  Her weight was up 1 kg, lungs clear, slight 1+ edema on right ankle.  She is going to take her diuretic upon arrival to home to avoid going to the bathroom multiple times during pulmonary rehab.   Patient made great progress today regarding purse-lip breathing and relaxation.    We reviewed where she is regarding smoking cessation.  She is down to 4 cigarettes per day, was smoking 1 pack per day.  Today she had not smoked at all.  She has decided on a quit date of October 24, 2012.  She is going to visit her son and granddaughter in [place] on October 4 and that is her inspiration to quit.

## 2012-10-01 ENCOUNTER — Encounter (HOSPITAL_COMMUNITY)
Admission: RE | Admit: 2012-10-01 | Discharge: 2012-10-01 | Disposition: A | Discharge status: Home / Self Care | Payer: Medicare Other | Source: Ambulatory Visit | Attending: Internal Medicine | Admitting: Internal Medicine

## 2012-10-01 ENCOUNTER — Ambulatory Visit (HOSPITAL_COMMUNITY): Payer: Medicare Other

## 2012-10-05 ENCOUNTER — Other Ambulatory Visit: Payer: Self-pay | Admitting: Internal Medicine

## 2012-10-06 ENCOUNTER — Encounter (HOSPITAL_COMMUNITY)
Admission: RE | Admit: 2012-10-06 | Discharge: 2012-10-06 | Disposition: A | Discharge status: Home / Self Care | Payer: Medicare Other | Source: Ambulatory Visit | Attending: Internal Medicine | Admitting: Internal Medicine

## 2012-10-06 ENCOUNTER — Ambulatory Visit (HOSPITAL_COMMUNITY): Payer: Medicare Other

## 2012-10-08 ENCOUNTER — Encounter (HOSPITAL_COMMUNITY)
Admission: RE | Admit: 2012-10-08 | Discharge: 2012-10-08 | Disposition: A | Discharge status: Home / Self Care | Payer: Medicare Other | Source: Ambulatory Visit | Attending: Internal Medicine | Admitting: Internal Medicine

## 2012-10-08 ENCOUNTER — Ambulatory Visit (HOSPITAL_COMMUNITY): Payer: Medicare Other

## 2012-10-13 ENCOUNTER — Telehealth (HOSPITAL_COMMUNITY): Payer: Self-pay | Admitting: Internal Medicine

## 2012-10-13 ENCOUNTER — Encounter (HOSPITAL_COMMUNITY): Payer: Medicare Other

## 2012-10-13 ENCOUNTER — Ambulatory Visit (HOSPITAL_COMMUNITY): Payer: Medicare Other

## 2012-10-15 ENCOUNTER — Encounter (HOSPITAL_COMMUNITY): Payer: Self-pay

## 2012-10-15 ENCOUNTER — Other Ambulatory Visit: Payer: Self-pay | Admitting: Internal Medicine

## 2012-10-15 ENCOUNTER — Ambulatory Visit (HOSPITAL_COMMUNITY): Payer: Medicare Other

## 2012-10-15 ENCOUNTER — Encounter (HOSPITAL_COMMUNITY)
Admission: RE | Admit: 2012-10-15 | Discharge: 2012-10-15 | Disposition: A | Discharge status: Home / Self Care | Payer: Medicare Other | Source: Ambulatory Visit | Attending: Internal Medicine | Admitting: Internal Medicine

## 2012-10-15 ENCOUNTER — Telehealth: Payer: Self-pay | Admitting: Internal Medicine

## 2012-10-15 NOTE — Progress Notes (Signed)
I have reviewed a Home Exercise Program with the patient.  [name] will be walking around her apartment complex for her exercise.  [name] was advised to walk 10 minutes 2 times a week on days that she doesn't attend Pulmonary Rehab.  [name] was advised to exercise for two minutes and rest for two minutes until she builds up some endurance.  Pursed-lip breathing was discussed at length.  We reviewed exercise guidelines, target heart rate during exercise, oxygen use, weather, home pulse oximeter, endpoints for exercise, and goals.  Patient has stated that they understand and are encouraged to come to me with any questions.

## 2012-10-15 NOTE — Progress Notes (Signed)
[name] 67 y.o. female  73 Day Psychosocial Assessment  Pt psychosocial assessment reveals patient's son lives with her.  She does not drive and he takes her where ever she needs to go and is supportive.  She feel as if she pays most of the bills, but he does help her when it is needed. Pt is currently retired. Pt hobbies include playing on the computer. Pt reports her stress level is low. Areas of stress/anxiety include finances and her health.   Pt does not exhibits signs of depression.  In fact,  [name]'s affect is very positive.  She had a busy day today and has enrolled in a COPD clinical trial in [place].  She is being paid $600.00 for participating in 6 visits.  Today she has only smoked 1/2 of a cigarette and hopefully tomorrow she will try being smoke free.   Pt shows good coping skills with positive  outlook .  Since participating in pulmonary rehab she feels she has a more positive attitude and much more energy.  She also feels she has more stamina to do household chores and has a reason to get up in the morning.Offered emotional support and reassurance. Monitor and evaluate progress toward psychosocial goals.  Goal(s): To stay smoke free To continue building of strength and stamina Help patient work toward returning to meaningful activities that improve patient's QOL and are attainable with patient's lung disease   10/15/2012 3:56 PM

## 2012-10-16 NOTE — Telephone Encounter (Addendum)
Forms completed and left at front for pt. A copy was placed in scan.Pt advised.  Carron Curie, CMA

## 2012-10-20 ENCOUNTER — Encounter (HOSPITAL_COMMUNITY)
Admission: RE | Admit: 2012-10-20 | Discharge: 2012-10-20 | Disposition: A | Discharge status: Home / Self Care | Payer: Medicare Other | Source: Ambulatory Visit | Attending: Internal Medicine | Admitting: Internal Medicine

## 2012-10-20 ENCOUNTER — Ambulatory Visit (HOSPITAL_COMMUNITY): Payer: Medicare Other

## 2012-10-22 ENCOUNTER — Ambulatory Visit (HOSPITAL_COMMUNITY): Payer: Medicare Other

## 2012-10-22 ENCOUNTER — Encounter (HOSPITAL_COMMUNITY): Payer: Medicare Other

## 2012-10-23 ENCOUNTER — Telehealth: Payer: Self-pay | Admitting: Internal Medicine

## 2012-10-23 NOTE — Telephone Encounter (Signed)
Spoke to pt. States that [facility] does not service [place]. Advised pt that if she could find a DME in her area we would be glad to get her orders to them. She is going to try to find a company in her area and call us with their information. Nothing further is needed at this time.

## 2012-10-23 NOTE — Telephone Encounter (Signed)
Called and spoke with pt and she is aware of MW recs.  She stated that she will contact [facility] about the oxygen.  She is aware to call our office back if she needs any help doing this.  Pt voiced her understanding and nothing further is needed.

## 2012-10-23 NOTE — Telephone Encounter (Signed)
lmomtcb  

## 2012-10-23 NOTE — Telephone Encounter (Signed)
Should not be having ongoing symptoms related to transiently low 02 though sorry to hear that happened to her and we need to make sure she has what she needs for the return flight but that's [facility]'s issue  If not feeling better will need to be seen out there to sort this out as can't be done over the phone

## 2012-10-23 NOTE — Telephone Encounter (Signed)
Called and spoke with pt and she stated that she is in [place] now.  She stated that the  Tank that she has from [facility] she was told would last at least 4 hours.  It only lasted about 1 hour and 10 mins.  She ran out while she was on the plane.  They had to give her oxygen and she stated that they called the ambulance for her when she got off the plane.  Her oxygen level was 70% since she had not been on her oxygen.  She is going to call [facility] to see about getting her oxygen refilled while she is there.  She said since this has happened she has no energy and she has to stop and rest when she is walking places.  She is aware that MR is not in the office today but did want some recs.  Will forward to MW to advise. Thanks  No Known Allergies

## 2012-10-24 DIAGNOSIS — Z833 Family history of diabetes mellitus: Secondary | ICD-10-CM | POA: Diagnosis not present

## 2012-10-24 DIAGNOSIS — E7800 Pure hypercholesterolemia, unspecified: Secondary | ICD-10-CM | POA: Diagnosis present

## 2012-10-24 DIAGNOSIS — I519 Heart disease, unspecified: Secondary | ICD-10-CM | POA: Diagnosis not present

## 2012-10-24 DIAGNOSIS — E119 Type 2 diabetes mellitus without complications: Secondary | ICD-10-CM | POA: Diagnosis present

## 2012-10-24 DIAGNOSIS — R0902 Hypoxemia: Secondary | ICD-10-CM | POA: Diagnosis not present

## 2012-10-24 DIAGNOSIS — R0602 Shortness of breath: Secondary | ICD-10-CM | POA: Diagnosis not present

## 2012-10-24 DIAGNOSIS — J441 Chronic obstructive pulmonary disease with (acute) exacerbation: Secondary | ICD-10-CM | POA: Diagnosis not present

## 2012-10-24 DIAGNOSIS — Z9981 Dependence on supplemental oxygen: Secondary | ICD-10-CM | POA: Diagnosis not present

## 2012-10-24 DIAGNOSIS — Z7982 Long term (current) use of aspirin: Secondary | ICD-10-CM | POA: Diagnosis not present

## 2012-10-24 DIAGNOSIS — F17200 Nicotine dependence, unspecified, uncomplicated: Secondary | ICD-10-CM | POA: Diagnosis not present

## 2012-10-24 DIAGNOSIS — I129 Hypertensive chronic kidney disease with stage 1 through stage 4 chronic kidney disease, or unspecified chronic kidney disease: Secondary | ICD-10-CM | POA: Diagnosis present

## 2012-10-24 DIAGNOSIS — F411 Generalized anxiety disorder: Secondary | ICD-10-CM | POA: Diagnosis not present

## 2012-10-24 DIAGNOSIS — N179 Acute kidney failure, unspecified: Secondary | ICD-10-CM | POA: Diagnosis not present

## 2012-10-24 DIAGNOSIS — Z23 Encounter for immunization: Secondary | ICD-10-CM | POA: Diagnosis not present

## 2012-10-24 DIAGNOSIS — Z90710 Acquired absence of both cervix and uterus: Secondary | ICD-10-CM | POA: Diagnosis not present

## 2012-10-24 DIAGNOSIS — J9600 Acute respiratory failure, unspecified whether with hypoxia or hypercapnia: Secondary | ICD-10-CM | POA: Diagnosis not present

## 2012-10-24 DIAGNOSIS — R079 Chest pain, unspecified: Secondary | ICD-10-CM | POA: Diagnosis not present

## 2012-10-24 DIAGNOSIS — I369 Nonrheumatic tricuspid valve disorder, unspecified: Secondary | ICD-10-CM | POA: Diagnosis not present

## 2012-10-24 DIAGNOSIS — R05 Cough: Secondary | ICD-10-CM | POA: Diagnosis present

## 2012-10-24 DIAGNOSIS — I509 Heart failure, unspecified: Secondary | ICD-10-CM | POA: Diagnosis not present

## 2012-10-24 DIAGNOSIS — E876 Hypokalemia: Secondary | ICD-10-CM | POA: Diagnosis not present

## 2012-10-24 DIAGNOSIS — Z8489 Family history of other specified conditions: Secondary | ICD-10-CM | POA: Diagnosis not present

## 2012-10-26 ENCOUNTER — Telehealth (HOSPITAL_COMMUNITY): Payer: Self-pay | Admitting: Internal Medicine

## 2012-10-27 ENCOUNTER — Encounter (HOSPITAL_COMMUNITY): Payer: Medicare Other

## 2012-10-27 ENCOUNTER — Ambulatory Visit (HOSPITAL_COMMUNITY): Payer: Medicare Other

## 2012-10-28 ENCOUNTER — Telehealth: Payer: Self-pay | Admitting: Internal Medicine

## 2012-10-28 NOTE — Telephone Encounter (Signed)
I spoke with [name]. Pt is currently in [place] in the hospital. Calling to give [place] update. When pt returns she needs to be seen by [place] when she returns 11/04/12. Pt apparently now has medicare and will need to be qualified per medicare guidelines [facility] has got in touch with pt in [place] and will be set up with O2 by local provider. Pt will be d/c with 3 liters cont flow. Pt will be set up with portable eclipse when she returns by [facility]. [facility] have arranged for this also to be sent to pt via mail w/ 4 back up batteries. She will be getting user manual as well with this. [facility] has spoke with pt in length that she will need to contact her airlines to make them aware of her oxygen system.   I advised will forward to MR as an [place]

## 2012-10-29 ENCOUNTER — Ambulatory Visit (HOSPITAL_COMMUNITY): Payer: Medicare Other

## 2012-10-29 ENCOUNTER — Encounter (HOSPITAL_COMMUNITY): Payer: Medicare Other

## 2012-11-03 ENCOUNTER — Encounter (HOSPITAL_COMMUNITY): Payer: Medicare Other

## 2012-11-03 ENCOUNTER — Ambulatory Visit (HOSPITAL_COMMUNITY): Payer: Medicare Other

## 2012-11-05 ENCOUNTER — Ambulatory Visit (HOSPITAL_COMMUNITY): Payer: Medicare Other

## 2012-11-05 ENCOUNTER — Encounter (HOSPITAL_COMMUNITY): Payer: Medicare Other

## 2012-11-09 ENCOUNTER — Ambulatory Visit (INDEPENDENT_AMBULATORY_CARE_PROVIDER_SITE_OTHER): Payer: Medicare Other | Admitting: Internal Medicine

## 2012-11-09 ENCOUNTER — Encounter: Payer: Self-pay | Admitting: Internal Medicine

## 2012-11-09 VITALS — BP 130/80 | HR 90 | Temp 97.1°F | Ht 64.0 in | Wt 224.0 lb

## 2012-11-09 DIAGNOSIS — Z23 Encounter for immunization: Secondary | ICD-10-CM

## 2012-11-09 DIAGNOSIS — J449 Chronic obstructive pulmonary disease, unspecified: Secondary | ICD-10-CM

## 2012-11-09 DIAGNOSIS — F4322 Adjustment disorder with anxiety: Secondary | ICD-10-CM | POA: Diagnosis not present

## 2012-11-09 DIAGNOSIS — F411 Generalized anxiety disorder: Secondary | ICD-10-CM | POA: Diagnosis not present

## 2012-11-09 MED ORDER — ALPRAZOLAM 0.5 MG PO TABS: ORAL_TABLET | ORAL | Status: DC

## 2012-11-09 NOTE — Assessment & Plan Note (Signed)
Xanax refilled today. ?

## 2012-11-09 NOTE — Assessment & Plan Note (Signed)
On steroids, axithromax On 2.5 L [place] but order is for 4 L- call pulmonology to see if they can change the order Flu shot today  F/u with pulmonology

## 2012-11-09 NOTE — Progress Notes (Signed)
Subjective:    Patient ID: [name], female    DOB: 1945-05-22, 67 y.o.   MRN: [id number]  HPI  Pt presents to the clinic today for hospital followup for COPD exacerbation. She was evaluated at a hospital in [place].Her oxygen machine failed on the airplane. When she got to the airport, she was evaluated by EMS with a O2 sat of 70%, in congestive heart failure. She was admitted to the hospital for 5 days, given IV lasix, antibiotics and steroids. She was discharged home oxygen on 4L [place]. She reports that she is doing well since returning home. Only mild shortness of breath. Her main issue is her fatigue. She does followup with cardiac rehab tommorow. She has a f/u appt with her pulmonologist in Nov. She does request a refill on her xanax today.  Review of Systems      Past Medical History  Diagnosis Date  . Hypertension   . COPD (chronic obstructive pulmonary disease)      FEV-1 in 2008 was 63% with a diffusion capacity of 33%.   . Tobacco abuse      Smokes one pack a day since age 61.  . Diastolic dysfunction   . History of cervical cancer   . Snoring disorder   . Pulmonary nodule march 2012    5mm RUL and RLL march 2012 CT  . 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 Bronchiectasis march 2012    On CT chest. RUL. Mild  . Medical non-compliance   . COPD exacerbation     march 2012  . Chicken pox   . Seasonal allergies   . Arrhythmia   . Cancer cervical ca 1982  . CHF (congestive heart failure)   . Hyperlipidemia     Current Outpatient Prescriptions  Medication Sig Dispense Refill  . albuterol (PROVENTIL HFA;VENTOLIN HFA) 108 (90 BASE) MCG/ACT inhaler Inhale 2 puffs into the lungs every 6 (six) hours as needed.        Marland Kitchen albuterol (PROVENTIL) (2.5 MG/3ML) 0.083% nebulizer solution Take 2.5 mg by nebulization every 6 (six) hours as needed for wheezing.      Marland Kitchen ALPRAZolam (XANAX) 0.5 MG tablet TAKE 1 TABLET BY MOUTH TWICE A DAY AS NEEDED  FOR ANXIETY  60 tablet  0  . amLODipine (NORVASC) 10 MG tablet Take 10 mg by mouth daily.        . cloNIDine (CATAPRES) 0.1 MG tablet Take 1 tablet (0.1 mg total) by mouth 2 (two) times daily.  60 tablet  2  . famotidine (PEPCID) 20 MG tablet Take 1 tablet (20 mg total) by mouth at bedtime.  30 tablet  5  . fluticasone (FLONASE) 50 MCG/ACT nasal spray Place 2 sprays into the nose as needed.       . furosemide (LASIX) 40 MG tablet Take 1 tablet (40 mg total) by mouth daily.  30 tablet  3  . loratadine (CLARITIN) 10 MG tablet Take 10 mg by mouth as needed.       Marland Kitchen losartan (COZAAR) 25 MG tablet Take 1 tablet (25 mg total) by mouth daily.  30 tablet  5  . mometasone-formoterol (DULERA) 200-5 MCG/ACT AERO Take 2 puffs first thing in am and then another 2 puffs about 12 hours later.  1 Inhaler  4  . omeprazole (PRILOSEC) 20 MG capsule Take 1 capsule (20 mg total) by mouth daily.  30 capsule  5  . simvastatin (  ZOCOR) 10 MG tablet Take 1 tablet (10 mg total) by mouth at bedtime.  90 tablet  3  . tiotropium (SPIRIVA) 18 MCG inhalation capsule Place 1 capsule (18 mcg total) into inhaler and inhale daily.  30 capsule  4   No current facility-administered medications for this visit.    No Known Allergies  Family History  Problem Relation Age of Onset  . Other Father     MVA  . Esophageal cancer Mother     History   Social History  . Marital Status: Married    Spouse Name: N/A    Number of Children: N/A  . 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 Smoking status: Current Every Day Smoker -- 0.50 packs/day for 44 years    Types: Cigarettes  . Smokeless tobacco: Never Used     Comment: down to 6 cigs per day, wants to do e cigs; I cautioned against that  . Alcohol Use: Yes     Comment: occasional  . Drug Use: No  . Sexual Activity: Not on file   Other Topics Concern  . Not on file   Social History Narrative   Married  but recently separated from husband     Constitutional: Pt reports fatigue.  Denies fever, malaise, headache or abrupt weight changes.  Respiratory: Pt reports mild shortness of breath. Denies difficulty breathing, cough or sputum production.   Cardiovascular: Denies chest pain, chest tightness, palpitations or swelling in the hands or feet.    No other specific complaints in a complete review of systems (except as listed in HPI above).  Objective:   Physical Exam   BP 130/80  Pulse 90  Temp(Src) 97.1 F (36.2 C) (Oral)  Ht 5\' 4"  (1.626 m)  Wt 224 lb (101.606 kg)  BMI 38.43 kg/m2  SpO2 92% Wt Readings from Last 3 Encounters:  11/09/12 224 lb (101.606 kg)  08/26/12 227 lb (102.967 kg)  07/28/12 223 lb (101.152 kg)    General: Appears her stated age, well developed, well nourished in NAD. Cardiovascular: Normal rate and rhythm. S1,S2 noted.  No murmur, rubs or gallops noted. No JVD or BLE edema. No carotid bruits noted. Pulmonary/Chest: Mild increased effort on 2.4 L [place] and positive vesicular breath sounds although fibrotic. No respiratory distress. No wheezes, rales or ronchi noted.    BMET    Component Value Date/Time   NA 141 08/26/2012 1530   K 3.5 08/26/2012 1530   CL 108 08/26/2012 1530   CO2 28 08/26/2012 1530   GLUCOSE 107* 08/26/2012 1530   BUN 14 08/26/2012 1530   CREATININE 0.9 08/26/2012 1530   CALCIUM 9.2 08/26/2012 1530   GFRNONAA >60 04/10/2010 0555   GFRAA  Value: >60        The eGFR has been calculated using the MDRD equation. This calculation has not been validated in all clinical situations. eGFR's persistently <60 mL/min signify possible Chronic Kidney Disease. 04/10/2010 0555    Lipid Panel     Component Value Date/Time   CHOL 187 08/26/2012 1530   TRIG 109.0 08/26/2012 1530   HDL 33.50* 08/26/2012 1530   CHOLHDL 6 08/26/2012 1530   VLDL 21.8 08/26/2012 1530   LDLCALC 132* 08/26/2012 1530    CBC    Component Value Date/Time   WBC 5.6 08/26/2012 1530   RBC 4.50  08/26/2012 1530   HGB 12.9 08/26/2012 1530  HCT 39.7 08/26/2012 1530   PLT 277.0 08/26/2012 1530   MCV 88.3 08/26/2012 1530   MCH 30.5 04/10/2010 0555   MCHC 32.6 08/26/2012 1530   RDW 15.2* 08/26/2012 1530   LYMPHSABS 0.9 04/09/2010 0408   MONOABS 0.5 04/09/2010 0408   EOSABS 0.0 04/09/2010 0408   BASOSABS 0.0 04/09/2010 0408    Hgb A1C No results found for this basename: HGBA1C        Assessment & Plan:

## 2012-11-09 NOTE — Patient Instructions (Signed)

## 2012-11-10 ENCOUNTER — Encounter (HOSPITAL_COMMUNITY)
Admission: RE | Admit: 2012-11-10 | Discharge: 2012-11-10 | Disposition: A | Discharge status: Home / Self Care | Payer: Medicare Other | Source: Ambulatory Visit | Attending: Internal Medicine | Admitting: Internal Medicine

## 2012-11-10 ENCOUNTER — Ambulatory Visit (HOSPITAL_COMMUNITY): Payer: Medicare Other

## 2012-11-10 DIAGNOSIS — I509 Heart failure, unspecified: Secondary | ICD-10-CM | POA: Diagnosis not present

## 2012-11-10 DIAGNOSIS — J9610 Chronic respiratory failure, unspecified whether with hypoxia or hypercapnia: Secondary | ICD-10-CM | POA: Diagnosis not present

## 2012-11-10 DIAGNOSIS — Z5189 Encounter for other specified aftercare: Secondary | ICD-10-CM | POA: Diagnosis not present

## 2012-11-10 DIAGNOSIS — I5032 Chronic diastolic (congestive) heart failure: Secondary | ICD-10-CM | POA: Diagnosis not present

## 2012-11-10 DIAGNOSIS — J449 Chronic obstructive pulmonary disease, unspecified: Secondary | ICD-10-CM | POA: Insufficient documentation

## 2012-11-10 DIAGNOSIS — R918 Other nonspecific abnormal finding of lung field: Secondary | ICD-10-CM | POA: Diagnosis not present

## 2012-11-10 DIAGNOSIS — J4489 Other specified chronic obstructive pulmonary disease: Secondary | ICD-10-CM | POA: Insufficient documentation

## 2012-11-12 ENCOUNTER — Encounter (HOSPITAL_COMMUNITY): Payer: Medicare Other

## 2012-11-12 ENCOUNTER — Ambulatory Visit (HOSPITAL_COMMUNITY): Payer: Medicare Other

## 2012-11-17 ENCOUNTER — Ambulatory Visit (HOSPITAL_COMMUNITY): Payer: Medicare Other

## 2012-11-17 ENCOUNTER — Encounter (HOSPITAL_COMMUNITY)
Admission: RE | Admit: 2012-11-17 | Discharge: 2012-11-17 | Disposition: A | Discharge status: Home / Self Care | Payer: Medicare Other | Source: Ambulatory Visit | Attending: Internal Medicine | Admitting: Internal Medicine

## 2012-11-17 DIAGNOSIS — Z5189 Encounter for other specified aftercare: Secondary | ICD-10-CM | POA: Diagnosis not present

## 2012-11-17 DIAGNOSIS — R918 Other nonspecific abnormal finding of lung field: Secondary | ICD-10-CM | POA: Diagnosis not present

## 2012-11-17 DIAGNOSIS — I509 Heart failure, unspecified: Secondary | ICD-10-CM | POA: Diagnosis not present

## 2012-11-17 DIAGNOSIS — I5032 Chronic diastolic (congestive) heart failure: Secondary | ICD-10-CM | POA: Diagnosis not present

## 2012-11-17 DIAGNOSIS — J449 Chronic obstructive pulmonary disease, unspecified: Secondary | ICD-10-CM | POA: Diagnosis not present

## 2012-11-17 DIAGNOSIS — J9610 Chronic respiratory failure, unspecified whether with hypoxia or hypercapnia: Secondary | ICD-10-CM | POA: Diagnosis not present

## 2012-11-17 NOTE — Progress Notes (Signed)
[name] 67 y.o. female   35 day   Psychosocial Note  Patient psychosocial assessment reveals no barriers to participation in Pulmonary Rehab. Patient has no psychosocial areas that are currently affecting patient's rehab experience.  Her son continues to provide transportation for patient to attend. Patient was looking forward to a trip to [place] to visit her son and granddaughter from which she has returned.  Her portable oxygen tank failed on the plane, and she was hospitalized for 5 days there and just returned to our program last week.  Her goal was to quit smoking by the time she left for her trip, but continues to smoke 4-5 cigarettes.  I reviewed smoking cessation options for her re: cold [place] or nicotine replacement patches, or discussing other options with her doctor.  Patient has no emotional problems.   She has interest in her usual activities which are reading and cooking.  She wants to start crocheting again, just needs to pick up the supplies. Patient does continue to exhibit positive coping skills to deal with her COPD. Offered emotional support and reassurance. Patient does feel she is making progress toward Pulmonary Rehab goals. Patient reports her health and activity level has improved in the past 30 days as evidenced by patient's report of increased ability to do more cooking and house work, and is tolerating the exercise in the pulmonary program better with her workloads increasing.   Patient states her son  has noticed changes in her activity or mood. Patient reports  feeling positive about current and projected progression in Pulmonary Rehab. After reviewing the patient's treatment plan, the patient is making progress toward Pulmonary Rehab goals. Patient's rate of progress toward rehab goals is fair. Plan of action to help patient continue to work towards rehab goals include giving support towards smoking cessation, and continuing to increase workloads on the equipment. Will  continue to monitor and evaluate progress toward psychosocial goal(s).  Goal(s) in progress: Give support regarding smoking cessation Continue to increase worloads Help patient work toward returning to meaningful activities that improve patient's QOL and are attainable with patient's lung disease Continue use of purse lip breathing

## 2012-11-19 ENCOUNTER — Encounter (HOSPITAL_COMMUNITY)
Admission: RE | Admit: 2012-11-19 | Discharge: 2012-11-19 | Disposition: A | Discharge status: Home / Self Care | Payer: Medicare Other | Source: Ambulatory Visit | Attending: Internal Medicine | Admitting: Internal Medicine

## 2012-11-19 ENCOUNTER — Ambulatory Visit (HOSPITAL_COMMUNITY): Payer: Medicare Other

## 2012-11-19 DIAGNOSIS — J449 Chronic obstructive pulmonary disease, unspecified: Secondary | ICD-10-CM | POA: Diagnosis not present

## 2012-11-19 DIAGNOSIS — Z5189 Encounter for other specified aftercare: Secondary | ICD-10-CM | POA: Diagnosis not present

## 2012-11-19 DIAGNOSIS — I5032 Chronic diastolic (congestive) heart failure: Secondary | ICD-10-CM | POA: Diagnosis not present

## 2012-11-19 DIAGNOSIS — R918 Other nonspecific abnormal finding of lung field: Secondary | ICD-10-CM | POA: Diagnosis not present

## 2012-11-19 DIAGNOSIS — I509 Heart failure, unspecified: Secondary | ICD-10-CM | POA: Diagnosis not present

## 2012-11-19 DIAGNOSIS — J9610 Chronic respiratory failure, unspecified whether with hypoxia or hypercapnia: Secondary | ICD-10-CM | POA: Diagnosis not present

## 2012-11-24 ENCOUNTER — Encounter (HOSPITAL_COMMUNITY): Payer: Medicare Other

## 2012-11-24 ENCOUNTER — Ambulatory Visit (HOSPITAL_COMMUNITY): Payer: Medicare Other

## 2012-11-26 ENCOUNTER — Ambulatory Visit (HOSPITAL_COMMUNITY): Payer: Medicare Other

## 2012-11-26 ENCOUNTER — Encounter (HOSPITAL_COMMUNITY): Payer: Medicare Other

## 2012-11-28 ENCOUNTER — Other Ambulatory Visit: Payer: Self-pay | Admitting: Internal Medicine

## 2012-12-01 ENCOUNTER — Encounter (HOSPITAL_COMMUNITY): Payer: Medicare Other

## 2012-12-01 ENCOUNTER — Ambulatory Visit (HOSPITAL_COMMUNITY): Payer: Medicare Other

## 2012-12-03 ENCOUNTER — Encounter (HOSPITAL_COMMUNITY): Payer: Medicare Other

## 2012-12-03 ENCOUNTER — Ambulatory Visit (HOSPITAL_COMMUNITY): Payer: Medicare Other

## 2012-12-08 ENCOUNTER — Ambulatory Visit (HOSPITAL_COMMUNITY): Payer: Medicare Other

## 2012-12-08 ENCOUNTER — Encounter (HOSPITAL_COMMUNITY): Payer: Medicare Other

## 2012-12-09 ENCOUNTER — Other Ambulatory Visit: Payer: Self-pay | Admitting: Internal Medicine

## 2012-12-09 NOTE — Telephone Encounter (Signed)
Called [facility] spoke with pharmacist gave regina approval for alprazolam.../lmb

## 2012-12-09 NOTE — Telephone Encounter (Signed)
Ok to phone in.

## 2012-12-10 ENCOUNTER — Encounter (HOSPITAL_COMMUNITY): Payer: Medicare Other

## 2012-12-10 ENCOUNTER — Ambulatory Visit (HOSPITAL_COMMUNITY): Payer: Medicare Other

## 2012-12-15 ENCOUNTER — Encounter (HOSPITAL_COMMUNITY): Payer: Medicare Other

## 2012-12-15 ENCOUNTER — Ambulatory Visit (HOSPITAL_COMMUNITY): Payer: Medicare Other

## 2012-12-17 ENCOUNTER — Ambulatory Visit (HOSPITAL_COMMUNITY): Payer: Medicare Other

## 2012-12-17 ENCOUNTER — Encounter (HOSPITAL_COMMUNITY): Payer: Medicare Other

## 2012-12-21 NOTE — Progress Notes (Signed)
[name] 67 y.o. female  77 day  Psychosocial Note  Patient psychosocial assessment reveals  barriers to participation in Pulmonary Rehab. Psychosocial areas that are currently affecting patient's rehab experience include concerns about transportation.  In the past [name]'s son provided transportation to and from the pulmonary rehab program.  He has since taken a job which interfeers with this.  To remedy this problem patient has filled out the paperwork for the scat bus who would transport patient to and from the program.  We also signed the paperwork as well.   I spoke with the patient 1 week ago and she is waiting for an interview with the provider  and was told it could take up to a 30 day wait for that interview.   Patient does continue to exhibit positive coping skills to deal with her psychosocial concerns. Offered emotional support and reassurance.  Her last day exercising with [place] was 11/19/2012 and hopes to return to the program as soon as the transportation issue is resolved.  Patient asked to call us when she will return to the program.    Goal(s) in progress: Reliable transportation to and from the pulmonary rehab program so patient may continue to progress

## 2012-12-22 ENCOUNTER — Ambulatory Visit (HOSPITAL_COMMUNITY): Payer: Medicare Other

## 2012-12-22 ENCOUNTER — Encounter (HOSPITAL_COMMUNITY)
Admission: RE | Admit: 2012-12-22 | Discharge: 2012-12-22 | Disposition: A | Discharge status: Home / Self Care | Payer: Medicare Other | Source: Ambulatory Visit | Attending: Internal Medicine | Admitting: Internal Medicine

## 2012-12-22 DIAGNOSIS — J9610 Chronic respiratory failure, unspecified whether with hypoxia or hypercapnia: Secondary | ICD-10-CM | POA: Insufficient documentation

## 2012-12-22 DIAGNOSIS — I5032 Chronic diastolic (congestive) heart failure: Secondary | ICD-10-CM | POA: Insufficient documentation

## 2012-12-22 DIAGNOSIS — J4489 Other specified chronic obstructive pulmonary disease: Secondary | ICD-10-CM | POA: Insufficient documentation

## 2012-12-22 DIAGNOSIS — I509 Heart failure, unspecified: Secondary | ICD-10-CM | POA: Insufficient documentation

## 2012-12-22 DIAGNOSIS — R918 Other nonspecific abnormal finding of lung field: Secondary | ICD-10-CM | POA: Insufficient documentation

## 2012-12-22 DIAGNOSIS — J449 Chronic obstructive pulmonary disease, unspecified: Secondary | ICD-10-CM | POA: Insufficient documentation

## 2012-12-22 DIAGNOSIS — Z5189 Encounter for other specified aftercare: Secondary | ICD-10-CM | POA: Insufficient documentation

## 2012-12-24 ENCOUNTER — Encounter (HOSPITAL_COMMUNITY): Payer: Medicare Other

## 2012-12-24 ENCOUNTER — Ambulatory Visit (HOSPITAL_COMMUNITY): Payer: Medicare Other

## 2012-12-28 ENCOUNTER — Ambulatory Visit: Payer: Medicare Other | Admitting: Internal Medicine

## 2012-12-29 ENCOUNTER — Encounter (HOSPITAL_COMMUNITY): Payer: Medicare Other

## 2012-12-29 ENCOUNTER — Ambulatory Visit (HOSPITAL_COMMUNITY): Payer: Medicare Other

## 2012-12-31 ENCOUNTER — Ambulatory Visit (HOSPITAL_COMMUNITY): Payer: Medicare Other

## 2012-12-31 ENCOUNTER — Other Ambulatory Visit: Payer: Self-pay | Admitting: Internal Medicine

## 2012-12-31 ENCOUNTER — Encounter (HOSPITAL_COMMUNITY): Payer: Medicare Other

## 2012-12-31 DIAGNOSIS — E119 Type 2 diabetes mellitus without complications: Secondary | ICD-10-CM

## 2013-01-02 ENCOUNTER — Other Ambulatory Visit: Payer: Self-pay | Admitting: Internal Medicine

## 2013-01-05 ENCOUNTER — Ambulatory Visit (HOSPITAL_COMMUNITY): Payer: Medicare Other

## 2013-01-05 ENCOUNTER — Encounter (HOSPITAL_COMMUNITY): Payer: Medicare Other

## 2013-01-06 ENCOUNTER — Other Ambulatory Visit: Payer: Self-pay | Admitting: Internal Medicine

## 2013-01-06 NOTE — Telephone Encounter (Signed)
Rx called in to pharmacy. 

## 2013-01-06 NOTE — Telephone Encounter (Signed)
Ok to phone in xanax 

## 2013-01-06 NOTE — Telephone Encounter (Signed)
Rx request for Xanax last filled 12/09/12 please advise

## 2013-01-07 ENCOUNTER — Encounter (HOSPITAL_COMMUNITY): Payer: Medicare Other

## 2013-01-11 ENCOUNTER — Emergency Department (HOSPITAL_COMMUNITY): Payer: Medicare Other

## 2013-01-11 ENCOUNTER — Emergency Department (HOSPITAL_COMMUNITY)
Admission: EM | Admit: 2013-01-11 | Discharge: 2013-01-11 | Discharge status: Against Medical Advice | Payer: Medicare Other | Attending: Emergency Medicine | Admitting: Emergency Medicine

## 2013-01-11 ENCOUNTER — Encounter (HOSPITAL_COMMUNITY): Payer: Self-pay | Admitting: Emergency Medicine

## 2013-01-11 ENCOUNTER — Encounter (HOSPITAL_COMMUNITY): Payer: Self-pay | Admitting: *Deleted

## 2013-01-11 DIAGNOSIS — I509 Heart failure, unspecified: Secondary | ICD-10-CM | POA: Diagnosis not present

## 2013-01-11 DIAGNOSIS — Z79899 Other long term (current) drug therapy: Secondary | ICD-10-CM | POA: Diagnosis not present

## 2013-01-11 DIAGNOSIS — J984 Other disorders of lung: Secondary | ICD-10-CM | POA: Diagnosis not present

## 2013-01-11 DIAGNOSIS — M7989 Other specified soft tissue disorders: Secondary | ICD-10-CM | POA: Diagnosis not present

## 2013-01-11 DIAGNOSIS — Z8541 Personal history of malignant neoplasm of cervix uteri: Secondary | ICD-10-CM | POA: Diagnosis not present

## 2013-01-11 DIAGNOSIS — F17200 Nicotine dependence, unspecified, uncomplicated: Secondary | ICD-10-CM | POA: Diagnosis not present

## 2013-01-11 DIAGNOSIS — IMO0002 Reserved for concepts with insufficient information to code with codable children: Secondary | ICD-10-CM | POA: Insufficient documentation

## 2013-01-11 DIAGNOSIS — Z8619 Personal history of other infectious and parasitic diseases: Secondary | ICD-10-CM | POA: Insufficient documentation

## 2013-01-11 DIAGNOSIS — Z9119 Patient's noncompliance with other medical treatment and regimen: Secondary | ICD-10-CM | POA: Diagnosis not present

## 2013-01-11 DIAGNOSIS — E785 Hyperlipidemia, unspecified: Secondary | ICD-10-CM | POA: Insufficient documentation

## 2013-01-11 DIAGNOSIS — I10 Essential (primary) hypertension: Secondary | ICD-10-CM | POA: Diagnosis not present

## 2013-01-11 DIAGNOSIS — R000 Tachycardia, unspecified: Secondary | ICD-10-CM | POA: Insufficient documentation

## 2013-01-11 DIAGNOSIS — J441 Chronic obstructive pulmonary disease with (acute) exacerbation: Secondary | ICD-10-CM | POA: Insufficient documentation

## 2013-01-11 DIAGNOSIS — I498 Other specified cardiac arrhythmias: Secondary | ICD-10-CM | POA: Diagnosis not present

## 2013-01-11 DIAGNOSIS — Z91199 Patient's noncompliance with other medical treatment and regimen due to unspecified reason: Secondary | ICD-10-CM | POA: Insufficient documentation

## 2013-01-11 DIAGNOSIS — J438 Other emphysema: Secondary | ICD-10-CM | POA: Diagnosis not present

## 2013-01-11 LAB — BASIC METABOLIC PANEL
BUN: 12 mg/dL (ref 6–23)
CO2: 26 mEq/L (ref 19–32)
Calcium: 9.1 mg/dL (ref 8.4–10.5)
Chloride: 99 mEq/L (ref 96–112)
Creatinine, Ser: 0.92 mg/dL (ref 0.50–1.10)
GFR calc Af Amer: 74 mL/min — ABNORMAL LOW (ref >90)

## 2013-01-11 LAB — CBC WITH DIFFERENTIAL/PLATELET
Basophils Absolute: 0 10*3/uL (ref 0.0–0.1)
Basophils Relative: 0 % (ref 0–1)
Eosinophils Relative: 1 % (ref 0–5)
HCT: 39.3 % (ref 36.0–46.0)
MCH: 24.7 pg — ABNORMAL LOW (ref 26.0–34.0)
MCHC: 30 g/dL (ref 30.0–36.0)
MCV: 82.4 fL (ref 78.0–100.0)
Monocytes Absolute: 0.8 10*3/uL (ref 0.1–1.0)
Platelets: 377 10*3/uL (ref 150–400)
RDW: 16.9 % — ABNORMAL HIGH (ref 11.5–15.5)

## 2013-01-11 MED ORDER — ALBUTEROL SULFATE (5 MG/ML) 0.5% IN NEBU
2.5000 mg | INHALATION_SOLUTION | Freq: Once | RESPIRATORY_TRACT | Status: AC
  Administered 2013-01-11: 2.5 mg via RESPIRATORY_TRACT
  Filled 2013-01-11: qty 0.5

## 2013-01-11 MED ORDER — IPRATROPIUM BROMIDE 0.02 % IN SOLN
0.5000 mg | Freq: Once | RESPIRATORY_TRACT | Status: AC
  Administered 2013-01-11: 0.5 mg via RESPIRATORY_TRACT
  Filled 2013-01-11: qty 2.5

## 2013-01-11 MED ORDER — PREDNISONE 20 MG PO TABS: 40.0000 mg | ORAL_TABLET | Freq: Every day | ORAL | Status: AC

## 2013-01-11 MED ORDER — PREDNISONE 20 MG PO TABS
60.0000 mg | ORAL_TABLET | Freq: Once | ORAL | Status: AC
  Administered 2013-01-11: 60 mg via ORAL
  Filled 2013-01-11: qty 3

## 2013-01-11 MED ORDER — ALPRAZOLAM 0.25 MG PO TABS
0.5000 mg | ORAL_TABLET | Freq: Once | ORAL | Status: AC
  Administered 2013-01-11: 0.5 mg via ORAL
  Filled 2013-01-11: qty 2

## 2013-01-11 MED ORDER — IOHEXOL 350 MG/ML SOLN
100.0000 mL | Freq: Once | INTRAVENOUS | Status: AC | PRN
  Administered 2013-01-11: 100 mL via INTRAVENOUS

## 2013-01-11 MED ORDER — ALBUTEROL SULFATE (5 MG/ML) 0.5% IN NEBU
5.0000 mg | INHALATION_SOLUTION | Freq: Once | RESPIRATORY_TRACT | Status: AC
  Administered 2013-01-11: 5 mg via RESPIRATORY_TRACT
  Filled 2013-01-11: qty 1

## 2013-01-11 NOTE — ED Provider Notes (Signed)
CSN: [id number]     Arrival date & time 01/11/13  4098 History   First MD Initiated Contact with Patient 01/11/13 [phone]     Chief Complaint  Patient presents with  . Shortness of Breath   (Consider location/radiation/quality/duration/timing/severity/associated sxs/prior Treatment) HPI Comments: 67 year old female presents with shortness of breath that started while she was getting ready to go to a study in [facility] for COPD. She chronically wears 3 L of oxygen has not been taking her inhalers recently for the study. As she's getting in the car she does she would not really make the study and went to the ER instead. She does feel it is kind of like prior COPD issues. She's not been having any signs of illness recently and this came on suddenly. She states that she's resting she feels better but is not sure how she did she tried to stand up. Denies any fevers or chest pain. States she is having a little bit of swelling in her right lower extremity over her left. She has been to the hospital in the last 2 months in [place] where she was given a lot of Lasix and had fluid taken off her.   Past Medical History  Diagnosis Date  . Hypertension   . COPD (chronic obstructive pulmonary disease)      FEV-1 in 2008 was 63% with a diffusion capacity of 33%.   . Tobacco abuse      Smokes one pack a day since age 39.  . Diastolic dysfunction   . History of cervical cancer   . Snoring disorder   . Pulmonary nodule march 2012    5mm RUL and RLL march 2012 CT  . 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 Bronchiectasis march 2012    On CT chest. RUL. Mild  . Medical non-compliance   . COPD exacerbation     march 2012  . Chicken pox   . Seasonal allergies   . Arrhythmia   . Cancer cervical ca 1982  . CHF (congestive heart failure)   . Hyperlipidemia    Past Surgical History  Procedure Laterality Date  . Total abdominal hysterectomy     Family History   Problem Relation Age of Onset  . Other Father     MVA  . Esophageal cancer Mother    History  Substance Use Topics  . Smoking status: Current Every Day Smoker -- 0.50 packs/day for 44 years    Types: Cigarettes  . Smokeless tobacco: Never Used     Comment: down to 6 cigs per day, wants to do e cigs; I cautioned against that  . Alcohol Use: Yes     Comment: occasional   OB History   Grav Para Term Preterm Abortions TAB SAB Ect Mult Living                 Review of Systems  Constitutional: Negative for fever and chills.  Respiratory: Positive for shortness of breath.   Cardiovascular: Positive for leg swelling. Negative for chest pain.  Gastrointestinal: Negative for vomiting and abdominal pain.  All other systems reviewed and are negative.    Allergies  Review of patient's allergies indicates no known allergies.  Home Medications   Current Outpatient Rx  Name  Route  Sig  Dispense  Refill  . albuterol (PROVENTIL HFA;VENTOLIN HFA) 108 (90 BASE) MCG/ACT inhaler   Inhalation   Inhale 2 puffs into the  lungs every 6 (six) hours as needed.           Marland Kitchen albuterol (PROVENTIL) (2.5 MG/3ML) 0.083% nebulizer solution   Nebulization   Take 2.5 mg by nebulization every 6 (six) hours as needed for wheezing.         Marland Kitchen ALPRAZolam (XANAX) 0.5 MG tablet   Oral   Take 0.5 mg by mouth 2 (two) times daily as needed for anxiety.         . cloNIDine (CATAPRES) 0.1 MG tablet   Oral   Take 0.1 mg by mouth 2 (two) times daily.         . famotidine (PEPCID) 20 MG tablet   Oral   Take 1 tablet (20 mg total) by mouth at bedtime.   30 tablet   5   . fluticasone (FLONASE) 50 MCG/ACT nasal spray   Nasal   Place 2 sprays into the nose as needed.          . furosemide (LASIX) 40 MG tablet   Oral   Take 40 mg by mouth daily.         Marland Kitchen losartan (COZAAR) 25 MG tablet   Oral   Take 1 tablet (25 mg total) by mouth daily.   30 tablet   5   . mometasone-formoterol (DULERA)  200-5 MCG/ACT AERO      Take 2 puffs first thing in am and then another 2 puffs about 12 hours later.   1 Inhaler   4   . omeprazole (PRILOSEC) 20 MG capsule   Oral   Take 1 capsule (20 mg total) by mouth daily.   30 capsule   5   . simvastatin (ZOCOR) 10 MG tablet   Oral   Take 1 tablet (10 mg total) by mouth at bedtime.   90 tablet   3   . tiotropium (SPIRIVA) 18 MCG inhalation capsule   Inhalation   Place 1 capsule (18 mcg total) into inhaler and inhale daily.   30 capsule   4    BP 122/66  Pulse 104  Temp(Src) 98.8 F (37.1 C) (Oral)  SpO2 92% Physical Exam  Vitals reviewed. Constitutional: She is oriented to person, place, and time. She appears well-developed and well-nourished.  HENT:  Head: Normocephalic and atraumatic.  Right Ear: External ear normal.  Left Ear: External ear normal.  Nose: Nose normal.  Eyes: Right eye exhibits no discharge. Left eye exhibits no discharge.  Cardiovascular: Regular rhythm and normal heart sounds.  Tachycardia present.   Pulmonary/Chest: Tachypnea noted. She has decreased breath sounds. She has no wheezes.  Abdominal: Soft. She exhibits no distension.  Musculoskeletal: She exhibits no edema.  Slightly swollen right lower extremity  Neurological: She is alert and oriented to person, place, and time.  Skin: Skin is warm and dry.    ED Course  Procedures (including critical care time) Labs Review Labs Reviewed  BASIC METABOLIC PANEL - Abnormal; Notable for the following:    Glucose, Bld 121 (*)    GFR calc non Af Amer 64 (*)    GFR calc Af Amer 74 (*)    All other components within normal limits  CBC WITH DIFFERENTIAL - Abnormal; Notable for the following:    Hemoglobin 11.8 (*)    MCH 24.7 (*)    RDW 16.9 (*)    All other components within normal limits   Imaging Review Dg Chest 2 View  01/11/2013   CLINICAL DATA:  Shortness of breath.  EXAM: CHEST  2 VIEW  COMPARISON:  08/26/2012  FINDINGS: The cardiac silhouette  is within normal limits for size. The thoracic aorta remains tortuous. The lungs remain hyperinflated with diffusely increased interstitial markings bilaterally, similar to prior. Right mid and lower lung scarring is unchanged. There is no evidence of new segmental airspace consolidation or pleural effusion. No acute osseous abnormality is identified.  IMPRESSION: Similar appearance of background interstitial prominence, compatible with underlying chronic lung disease. No definite evidence of acute airspace disease.   Electronically Signed   By: Sebastian Ache   On: 01/11/2013 09:58   Ct Angio Chest Pe W/cm &/or Wo Cm  01/11/2013   CLINICAL DATA:  Difficulty breathing. Shortness of breath and leg swelling.  EXAM: CT ANGIOGRAPHY CHEST WITH CONTRAST  TECHNIQUE: Multidetector CT imaging of the chest was performed using the standard protocol during bolus administration of intravenous contrast. Multiplanar CT image reconstructions including MIPs were obtained to evaluate the vascular anatomy.  CONTRAST:  OMNIPAQUE IOHEXOL 350 MG/ML SOLN  COMPARISON:  06/25/2012  FINDINGS: Study is degraded by breathing motion. Within this limitation, no filling defect is identified within the opacified pulmonary arteries to suggest the presence of an acute pulmonary embolus. No aneurysm or dissection of the thoracic aorta. The patient is noted to have apparent origin of the right subclavian artery.  Borderline and mild mediastinal lymphadenopathy again noted. 9 mm short axis high right paratracheal lymph node on image 30 was 8 mm in short axis previously. The 13 mm short axis AP window lymph node on the previous study is stable with 13 mm. 11 mm short axis lymph node in the left hilum is evident.  Heart size is borderline increased. No pericardial effusion. No evidence for pleural effusion. Small lymph node adjacent to the distal esophagus is stable.  Lung windows show advanced emphysema in both upper lobes. The 4 mm right upper  lobe nodule seen on image 46 today is stable since the prior study and also when comparing back to 04/05/2010. 4 mm right middle lobe nodule on image 63 is also stable since 2012. 5 mm right middle lobe nodule on image 72 was probably present on the 2012 exam, but was obscured by motion. As such, definite stability for 2 years on this nodule cannot be established today. Other scattered subcentimeter pulmonary nodules are identified bilaterally.  Bone windows reveal no worrisome lytic or sclerotic osseous lesions. The visualized portions of the liver show diffuse fatty infiltration.  Review of the MIP images confirms the above findings.  IMPRESSION: No CT evidence for acute pulmonary embolus.  Progression of mediastinal lymphadenopathy since the study from 2012 although no substantial change since 06/25/2012.  Emphysema with scattered bilateral pulmonary nodules, right greater than left. These are unchanged since 06/25/2012 in some been stable since 04/04/2010. Consider repeat CT without contrast in 6 months to ensure continued stability. If the patient is at high risk for bronchogenic carcinoma, follow-up chest CT at 6-12 months is recommended. If the patient is at low risk for bronchogenic carcinoma, follow-up chest CT at 12 months is recommended. This recommendation follows the consensus statement: Guidelines for Management of Small Pulmonary Nodules Detected on CT Scans: A Statement from the Fleischner Society as published in Radiology 2005;237:395-400.   Electronically Signed   By: Kennith Center M.D.   On: 01/11/2013 14:13    EKG Interpretation    Date/Time:  Monday January 11 2013 08:56:19 EST Ventricular Rate:  100 PR Interval:  153 QRS Duration:  79 QT Interval:  334 QTC Calculation: 431 R Axis:   67 Text Interpretation:  Sinus tachycardia no signs of ischemia No significant change since last tracing Confirmed by Ichael Pullara  MD, Theordore Cisnero ([id number]) on 01/11/2013 9:31:54 AM            MDM   1.  COPD exacerbation    Patient improved with some duo nebs. She states she feels back to her baseline but still appears tachypneic. She has sats in the low 90s with her baseline 3 L of oxygen. Due to the amount of distress she was having initially along with this wall and leg CT was obtained to rule out PE. CT scan has some artifact but does not seem to have a pulmonary embolism. Patient refused to have the DVT study on her lower strandy she's had the swelling for had negative DVT studies in the past. After treatments patient was helped into a chair and had significant tachycardia and mild hypoxia to the high 80s. She appeared more tachypnea. I discussed that she needs admission for respiratory treatments, steroids, and further treatment. She wants to go home. She has a clear sensorium, has decision-making capacity, and understands risks of leaving including death, significant morbidity. She states she will return if her treatment at home did not help or she cannot handle it at home. Will discharge with steroid burst for    Audree Camel, MD 01/11/13 1646

## 2013-01-11 NOTE — ED Notes (Signed)
Pt became short of breath while getting up from stretcher to chair. O2sats decreased to 85% 3lpm and heart rate increased to 120s. EDP notified. Pt is stating that she does not want to be admitted to the hospital. Stated that she wants to be home for christmas.

## 2013-01-11 NOTE — Progress Notes (Unsigned)
     HPI  ROS        Physical Exam                  ***   [name] 67 y.o. female  120 day Psychosocial Note  Patient psychosocial assessment reveals a  barrier to participation in Pulmonary Rehab.  The patient's son has taken a job which has interfered with patient being able to attend pulmonary rehab.   Transportation is the barrier and it has been resolved.  Patient had to apply for the  [employer] bus program.  It has been a long process, and patient has been accepted.  She is to return to the program January 8th, 2015.  The last class the patient participated in was 11/19/2012.  We look forward to moving forward with her rehab goals which are listed below.  Goal(s) in progress: Help patient work toward returning to meaningful activities that improve patient's QOL and are attainable with patient's lung disease  Assist patient in increasing her ability to take care of herself, get out by herself, and drive a car herself.  Briscoe Deutscher

## 2013-01-11 NOTE — ED Notes (Signed)
Pt has COPD/home O2 dependent.  Pt has headache and is sob.  Pt is taking a COPD study in [place] today and was told not to take inhalers past MN.

## 2013-01-11 NOTE — Progress Notes (Signed)
Patient came to vascular lab at 1:00pm for right lower venous duplex. Patient did not want this test because she felt she did not need it and at least wanted to wait until CTA was done and confirmed PE.  Called RN. She will let MD know.   Farrel Demark, RDMS, RVT 01/11/2013  1:00pm

## 2013-01-11 NOTE — ED Notes (Signed)
Pt is refusing Lower Extremity Venous Duplex Right.

## 2013-01-16 ENCOUNTER — Other Ambulatory Visit: Payer: Self-pay | Admitting: Internal Medicine

## 2013-01-18 ENCOUNTER — Other Ambulatory Visit: Payer: Self-pay | Admitting: Internal Medicine

## 2013-01-27 ENCOUNTER — Telehealth: Payer: Self-pay | Admitting: Internal Medicine

## 2013-01-27 NOTE — Telephone Encounter (Signed)
No need for repeat Ct chest  Dr. Brand Males, M.D., [facility].C.P Pulmonary and Critical Care Medicine Staff Physician [place] [facility] Pager: [phone], If no answer or between  15:00h - 7:00h: call [phone]  01/27/2013 3:35 PM

## 2013-01-27 NOTE — Telephone Encounter (Signed)
Spoke with pt and is aware. Advised her will call and cancel CT scheduled for Monday.  I called and spoke Lattie Haw from [place] and cancelled this. Nothing further needed

## 2013-01-27 NOTE — Telephone Encounter (Signed)
Called and spoke with pt and she stated that she was seen in the ER on 12/22 for SOB.  She stated that they did CT to check for PE and labs.   She stated that she is scheduled on Monday for CT scan and wanted to make sure that she will still need to go in for this.  MR please advise. Thanks  No Known Allergies   Current Outpatient Prescriptions on File Prior to Visit  Medication Sig Dispense Refill  . albuterol (PROVENTIL HFA;VENTOLIN HFA) 108 (90 BASE) MCG/ACT inhaler Inhale 2 puffs into the lungs every 6 (six) hours as needed.        Marland Kitchen albuterol (PROVENTIL) (2.5 MG/3ML) 0.083% nebulizer solution Take 2.5 mg by nebulization every 6 (six) hours as needed for wheezing.      Marland Kitchen ALPRAZolam (XANAX) 0.5 MG tablet Take 0.5 mg by mouth 2 (two) times daily as needed for anxiety.      . cloNIDine (CATAPRES) 0.1 MG tablet Take 0.1 mg by mouth 2 (two) times daily.      . DULERA 200-5 MCG/ACT AERO TAKE 2 PUFFS FIRST THING IN THE MORNING AND THEN ANOTHER 2 PUFFS ABOUT 12 HOURS LATER.  13 g  4  . famotidine (PEPCID) 20 MG tablet Take 1 tablet (20 mg total) by mouth at bedtime.  30 tablet  5  . fluticasone (FLONASE) 50 MCG/ACT nasal spray Place 2 sprays into the nose as needed.       . furosemide (LASIX) 40 MG tablet Take 40 mg by mouth daily.      Marland Kitchen losartan (COZAAR) 25 MG tablet Take 1 tablet (25 mg total) by mouth daily.  30 tablet  5  . omeprazole (PRILOSEC) 20 MG capsule Take 1 capsule (20 mg total) by mouth daily.  30 capsule  5  . simvastatin (ZOCOR) 10 MG tablet Take 1 tablet (10 mg total) by mouth at bedtime.  90 tablet  3  . SPIRIVA HANDIHALER 18 MCG inhalation capsule PLACE 1 CAPSULE INTO INHALER AND INHALE DAILY.  30 capsule  0   No current facility-administered medications on file prior to visit.

## 2013-01-28 ENCOUNTER — Telehealth (HOSPITAL_COMMUNITY): Payer: Self-pay | Admitting: Internal Medicine

## 2013-01-28 ENCOUNTER — Encounter (HOSPITAL_COMMUNITY): Payer: Medicare Other

## 2013-02-01 ENCOUNTER — Inpatient Hospital Stay: Admission: RE | Admit: 2013-02-01 | Payer: Medicare Other | Source: Ambulatory Visit

## 2013-02-02 ENCOUNTER — Telehealth (HOSPITAL_COMMUNITY): Payer: Self-pay | Admitting: Internal Medicine

## 2013-02-02 ENCOUNTER — Encounter (HOSPITAL_COMMUNITY): Payer: Medicare Other

## 2013-02-03 ENCOUNTER — Encounter: Payer: Self-pay | Admitting: Internal Medicine

## 2013-02-04 ENCOUNTER — Encounter (HOSPITAL_COMMUNITY): Payer: Medicare Other

## 2013-02-04 ENCOUNTER — Telehealth: Payer: Self-pay | Admitting: Internal Medicine

## 2013-02-04 ENCOUNTER — Telehealth (HOSPITAL_COMMUNITY): Payer: Self-pay | Admitting: Internal Medicine

## 2013-02-04 NOTE — Telephone Encounter (Signed)
[email or web address].Jennifer Castillo, CMA  

## 2013-02-05 ENCOUNTER — Telehealth: Payer: Self-pay | Admitting: Internal Medicine

## 2013-02-05 MED ORDER — PREDNISONE 10 MG PO TABS: ORAL_TABLET | ORAL | Status: DC

## 2013-02-05 MED ORDER — ALBUTEROL SULFATE (2.5 MG/3ML) 0.083% IN NEBU: 2.5000 mg | INHALATION_SOLUTION | Freq: Four times a day (QID) | RESPIRATORY_TRACT | Status: DC | PRN

## 2013-02-05 NOTE — Telephone Encounter (Signed)
Called spoke with pt. She is needing a refill on her albuterol neb RX. I have sent this in for her. She also c/o increase SOB w/ exertion, very little dry cough (not constant). Denies any wheezing and no chest tx, no fever, no chills, no sweats, no body aches. She reports when this happened in December and she went to the ED-she was giving prednisone and it helped. She is wanting to know if this can be called in? No openings in the office. Please advise MR thanks  /No Known Allergies

## 2013-02-05 NOTE — Telephone Encounter (Signed)
Spoke with pt and is aware of MR recs. She voiced her understanding and needed nothing further

## 2013-02-05 NOTE — Telephone Encounter (Signed)
Ok for prednisone  Please take prednisone 40 mg x1 day, then 30 mg x1 day, then 20 mg x1 day, then 10 mg x1 day, and then 5 mg x1 day and stop

## 2013-02-05 NOTE — Telephone Encounter (Signed)
Called and spoke with pt and she stated that the albuterol neb meds she is not able to get at this time due to the new medicare form that has to be submitted and approved before her insurance will cover the medication.  i advised the pt that i can send in some to get her through until this is approved but she will have to pay a cash price for this.  Pt is aware and this has been sent to [facility] on [address].

## 2013-02-05 NOTE — Telephone Encounter (Signed)
PT is returning triage's call.  [name]

## 2013-02-09 ENCOUNTER — Encounter (HOSPITAL_COMMUNITY)
Admission: RE | Admit: 2013-02-09 | Discharge: 2013-02-09 | Disposition: A | Discharge status: Home / Self Care | Payer: Medicare Other | Source: Ambulatory Visit | Attending: Internal Medicine | Admitting: Internal Medicine

## 2013-02-09 DIAGNOSIS — I509 Heart failure, unspecified: Secondary | ICD-10-CM | POA: Diagnosis not present

## 2013-02-09 DIAGNOSIS — R918 Other nonspecific abnormal finding of lung field: Secondary | ICD-10-CM | POA: Diagnosis not present

## 2013-02-09 DIAGNOSIS — J9610 Chronic respiratory failure, unspecified whether with hypoxia or hypercapnia: Secondary | ICD-10-CM | POA: Diagnosis not present

## 2013-02-09 DIAGNOSIS — J449 Chronic obstructive pulmonary disease, unspecified: Secondary | ICD-10-CM | POA: Insufficient documentation

## 2013-02-09 DIAGNOSIS — J4489 Other specified chronic obstructive pulmonary disease: Secondary | ICD-10-CM | POA: Insufficient documentation

## 2013-02-09 DIAGNOSIS — Z5189 Encounter for other specified aftercare: Secondary | ICD-10-CM | POA: Insufficient documentation

## 2013-02-09 DIAGNOSIS — I5032 Chronic diastolic (congestive) heart failure: Secondary | ICD-10-CM | POA: Diagnosis not present

## 2013-02-09 NOTE — Psychosocial Assessment (Signed)
Reviewed Dr. Brand Males, M.D., [facility].C.P Pulmonary and Critical Care Medicine Staff Physician [place] [facility] Pager: [phone], If no answer or between  15:00h - 7:00h: call [phone]  02/09/2013 7:31 PM

## 2013-02-09 NOTE — Psychosocial Assessment (Signed)
[name] 68 y.o. female  150 day Psychosocial Note  Patient psychosocial assessment reveals a continued transportation and health issue barrier to participation in Pulmonary Rehab. Patient has a transportation service called [employer] that has been set up for patient to use starting February 21, 2013.  She has been sick with the flu and an upper respiratory illness requiring prednisone and antibiotics.  She is nearing completion of  this medication regime.  Today was her first day exercising with [place] since November 19, 2012.  Patient's son was able to bring her to class today.  She is very deconditioned, and she felt as if this was again her first day of exercise.  Due to this she will be appropriate to extend to 36 sessions.   Patient does continue to exhibit positive coping skills to deal with her psychosocial concerns. Offered emotional support and reassurance. Patient does not feel she is making progress toward Pulmonary Rehab goals due to her inability to get transportation and attend on a regular basis.  Patient reports her health and activity level has not improved in the past 30 days as evidenced by patient's report of decreased ability to exercise . Patient states family/friends have not noticed changes in her activity or mood. Patient reports  feeling positive about current and projected progression in Pulmonary Rehab. After reviewing the patient's treatment plan, the patient is not making progress toward Pulmonary Rehab goals, again due to inability to attend the program. Patient's rate of progress toward rehab goals is inadequate. Plan of action to help patient continue to work towards rehab goals include reinforce purse-lip breathing and increase workloads as appropriate.  Will continue to monitor and evaluate progress toward psychosocial goal(s).  Goal(s) in progress: Regular attendance Improved coping skills Help patient work toward returning to meaningful activities that improve patient's QOL  and are attainable with patient's lung disease

## 2013-02-10 ENCOUNTER — Other Ambulatory Visit: Payer: Self-pay

## 2013-02-10 ENCOUNTER — Other Ambulatory Visit: Payer: Self-pay | Admitting: Internal Medicine

## 2013-02-10 NOTE — Telephone Encounter (Signed)
Xanax last filled 01/06/13--please advise

## 2013-02-10 NOTE — Telephone Encounter (Signed)
Please phone in xanax 

## 2013-02-10 NOTE — Telephone Encounter (Signed)
Rx called in to pharmacy. 

## 2013-02-11 ENCOUNTER — Encounter (HOSPITAL_COMMUNITY): Admission: RE | Admit: 2013-02-11 | Payer: Medicare Other | Source: Ambulatory Visit

## 2013-02-16 ENCOUNTER — Encounter: Payer: Self-pay | Admitting: Internal Medicine

## 2013-02-16 ENCOUNTER — Encounter (HOSPITAL_COMMUNITY): Admission: RE | Admit: 2013-02-16 | Payer: Medicare Other | Source: Ambulatory Visit

## 2013-02-16 ENCOUNTER — Ambulatory Visit (INDEPENDENT_AMBULATORY_CARE_PROVIDER_SITE_OTHER): Payer: Medicare Other | Admitting: Internal Medicine

## 2013-02-16 VITALS — BP 122/74 | HR 100 | Temp 97.7°F | Ht 64.0 in | Wt 229.0 lb

## 2013-02-16 DIAGNOSIS — J9610 Chronic respiratory failure, unspecified whether with hypoxia or hypercapnia: Secondary | ICD-10-CM

## 2013-02-16 DIAGNOSIS — Z720 Tobacco use: Secondary | ICD-10-CM

## 2013-02-16 DIAGNOSIS — F17200 Nicotine dependence, unspecified, uncomplicated: Secondary | ICD-10-CM | POA: Diagnosis not present

## 2013-02-16 DIAGNOSIS — J449 Chronic obstructive pulmonary disease, unspecified: Secondary | ICD-10-CM

## 2013-02-16 MED ORDER — PREDNISONE 10 MG PO TABS: ORAL_TABLET | ORAL | Status: DC

## 2013-02-16 NOTE — Progress Notes (Signed)
Subjective:    Patient ID: [name], female    DOB: 08-29-1945 .   MRN: [id number]  HPI OV April 2012: Obese female hospitalized in March 2012 and now following. 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 OVerall doing much better since discharge. Using o2. No insurance. Unable to afford xopenex and pulmicort neb. Able to afford atroven neb. Wanting med change. Dysponea is at baseline class 3. Reportedly cut down on smoking to 1 cig/day. Mild pedal edema improving. Admits to snoring and excess day time somnolence   FEV-1 in 2008 was 63% with a diffusion capacity of 33%   #COPD  - due to cost stop taking pulmicort neb and xopenex neb  - your new regimen is atrovent neb 4 times daily., QVAR inhaler 2 puff bid, albuterol inhaler 2 puff as needed (take samples and show technique)  - use oxygen all 24 hours (we will Hold off on portable system due to cost issues)  # sleep apnea  - you probably have this  - have sleep study and fu with Dr. Elsworth Soho or Dr Halford Chessman  #Smoking  - please quit  #CT lung nodule and node  - we will do CT chest in 6 months  #Followup  - 2 months or sooner if needed  - if new meds are costly please call us soon  - please see $ counsillor soon   OV 11/12/10: Followup a) COPD; b) smoking; c) lung nodule; and d) possible sleep apnea. COPD is stable. Using mdi and nebs. Still smoking.  Wants to do e-cigs and is asking infor about it. Had sleep study in May 2012: AHI 1.8, PLM 27 but denies restless legs. $ issues and did not fu with Dr Halford Chessman. Says she is finding it tough to pay bills for sleep apnea. Has not had CT chest as fu for nodule; cost issue again. Does not qualify for medicaid. Medicare few years away. Does not have private health care. Goes to  [employer]  Past, Family and Social: no change    #sleep study  - you just need to use oxygen with sleep  - you do not need cpap machine  #Smoking  - please quit smoking  - at this point research does not recommend e-cigarette  #COPD  - you use your nebulizers as scheduled  #lung nodule  - you need followup CT chest in next 3-6 months (with contrast)  - meet with my manager to find out cost esp for [facility] patients  #Followup  - 6 months  - flu shot today   OV 06/22/2012  Followup a) COPD; b) smoking; c) lung nodule;  Have not seen her since October 2012. She only now got her Medicare in April 2015 and therefore made this appointment. Overall she says she was stable but now for the past 3 weeks is having increased cough, wheezing, chest tightness, increased white color phlegm. She is not using her medications correctly. She is finding nebulizers to much of a hassle to use. She does use her Qvar but I'm not sure of her compliance. COPD cat score is 35.  In terms of smoking: She continues to smoke conventional tobacco. The office smells of tobacco. She's not sure she will be interested in quitting  In terms of lung nodule: It has been over  2 years since she had her last CT scan of the chest. She never wanted to have a CT scan of the chest because she had to pay for it out of pocket. But now that she has Medicare she is willing to have the CT scan of the chest  rec #COPD flareup - You are having a COPD flare up right now -  take levaquin 500mg  once daily  X 5 days - Please Please take prednisone 40 mg x1 day, then 30 mg x1 day, then 20 mg x1 day, then 10 mg x1 day, and then 5 mg x1 day and stop  #COPD - Continue Qvar 80 mcg 2 puff 2 times a day - Try ANORO 1 puff once daily  - this medication is a trial only   - learn technique from my CMA  #Lung nodule  = have followup CT chest - 2 year followup  #Smoking  - please quit  #Followup  - full PFT in 5 weeks - return  after ct chest in 5 weeks   06/30/2012 f/u ov/[name] still smoking Chief Complaint  Patient presents with  . Acute Visit    Pt states breathing is no better since visit here on 06/22/12. Still having alot of SOB and prod cough and mucus gets hung in her throat.   not using 02 daytime x after gets tired and sits down, has only tried the neb x one and it did help. Mucus is clear >>rx Dulera, stopped QVAR and Anoro    CT chest >previously seen right lung nodules are no longer  demonstrated. The third nodule is stable. Progressive mediastinal adenopathy.  Changes of COPD and chronic bronchitis  07/16/2012 Acute OV  Complains of increased SOB, and worsening LE edema x2 days Still smoking.  Wearing O2 2 l/m with act and At bedtime   Takes O2 off to smoke. Advised on smoking cessation and dangers of smoking Started on Dulera last ov , Anoro and QVAR were stopped. .  Was not able to start PPI and pepcid , wants an rx.  Wants something for anxiety. Ran out xanax , can not afford to go back to psychiatry.  No chest pain, syncope, fever, cough or n/v.    REC Continue on Dulera 2 puffs Twice daily   Begin Prilosec 20mg  daily before meal  Begin Pepcid 20mg  At bedtime   Xanax 0.5mg  Twice daily  As needed  Anxiety - will need to get refills thru family MD  NO SMOKING- at all especially in home where oxygen tanks/machine is present.  Keep legs elevated.  Take Lasix 20mg  2 tabs daily for 3 days then 1 tab daily  We are setting you up for an ultrasound of your right leg .  Please contact office for sooner follow up if symptoms do not improve or worsen or seek emergency care  follow up Dr. [name] in 2 weeks and As neede  OV 07/28/2012  FU  1) Lung nodule : had 2 year fu CT - see below. I personally reviewed this scan  Ct chest 06/25/12 IMPRESSION:  1. The previously seen right lung nodules are no longer  demonstrated. The third nodule is stable. This could be further  evaluated with a repeat  chest CT without contrast in 9-12 months.  2. Progressive mediastinal adenopathy. - largest to me looks51mm 3. Changes of COPD and chronic bronchitis.  4. Linear scarring at the left lung base.  5. Diffuse hepatic steatosis.  Original Report Authenticated  By: Claudie Revering, M.D   2) COPD - stable diseaes. COPD Cat score is 23 and reflects significant symptoms. Says rehab still has not called her. She is interested. PFTs 78/14 shows gold stage 3 copd with  fev1 1.12L/49%, FVC 2.01L/70% - post bd parameters.  No bd response, Ratio 60/77%. TLC 87%, DLCO 5/21%   3) new RLE pedal edema:   Duplex LE - 07/21/12 - negative. BNP 07/16/12 - 25 and bmet normal. Apparently Dr Melvyn Novas has given her lasix which is helping. She is frustrated she does not know etiology. She has no PCP   Oct 2014 hosp in [place] with chf ? Related to hypoxemia > 3lpm but weaned it to 2lpm   02/16/2013 f/u ov/Howell Groesbeck re: sob/ referred by rehab where she arrived per her usual routine Chief Complaint  Patient presents with  . Acute Visit    Pt c/o increased SOB with any exertion, chest congestion, nonprod cough.  much better on prednisone one week prior to OV  And last dose was 02/12/13 and worse again since. Confused with when/ how to use her various inhalers/ nebs  No obvious day to day or daytime variabilty or assoc   cp or chest tightness, subjective wheeze overt sinus or hb symptoms. No unusual exp hx or h/o childhood pna/ asthma or knowledge of premature birth.  Sleeping ok without nocturnal  or early am exacerbation  of respiratory  c/o's or need for noct saba. Also denies any obvious fluctuation of symptoms with weather or environmental changes or other aggravating or alleviating factors except as outlined above   Current Medications, Allergies, Complete Past Medical History, Past Surgical History, Family History, and Social History were reviewed in [employer] record.  ROS  The following are not active  complaints unless bolded sore throat, dysphagia, dental problems, itching, sneezing,  nasal congestion or excess/ purulent secretions, ear ache,   fever, chills, sweats, unintended wt loss, pleuritic or exertional cp, hemoptysis,  orthopnea pnd or leg swelling, presyncope, palpitations, heartburn, abdominal pain, anorexia, nausea, vomiting, diarrhea  or change in bowel or urinary habits, change in stools or urine, dysuria,hematuria,  rash, arthralgias, visual complaints, headache, numbness weakness or ataxia or problems with walking or coordination,  change in mood/affect or memory.                   Objective:   Physical Exam  Wt Readings from Last 3 Encounters:  02/16/13 229 lb (103.874 kg)  11/09/12 224 lb (101.606 kg)  08/26/12 227 lb (102.967 kg)      3lpm on arrival 100%    Constitutional: She is oriented to person, place, and time. She appears well-developed and well-nourished. No distress.       obese  HENT:  Head: Normocephalic and atraumatic.  Right Ear: External ear normal.  Left Ear: External ear normal.  Mouth/Throat: Oropharynx is clear and moist. No oropharyngeal exudate.  Eyes: Conjunctivae and EOM are normal. Pupils are equal, round, and reactive to light. Right eye exhibits no discharge. Left eye exhibits no discharge. No scleral icterus.  Neck: Normal range of motion. Neck supple. No JVD present. No tracheal deviation present. No thyromegaly present.       Obese neck mallampatti class 3  Cardiovascular: Normal rate, regular rhythm, normal heart sounds and intact distal pulses.  Exam reveals no gallop and no friction rub.  No murmur heard. 1+ edema bilateral right >left , neg homans sign.  Pulmonary/Chest: Effort normal. No respiratory distress.  She has no wheezes. She has no rales. She exhibits no tenderness.       Abdominal: Soft. Bowel sounds are normal. She exhibits no distension and no mass. There is no tenderness. There is no rebound and no guarding.   Musculoskeletal: Normal range of motion. She exhibits no edema and no tenderness.  Lymphadenopathy:    She has no cervical adenopathy.  Neurological: She is alert and oriented to person, place, and time. She has normal reflexes. No cranial nerve deficit. She exhibits normal muscle tone. Coordination normal.  Skin: Skin is warm and dry. No rash noted. She is not diaphoretic. No erythema. No pallor.  Psychiatric: She has a normal mood and affect. Her behavior is normal. Judgment and thought content normal.     01/11/13 CTa No CT evidence for acute pulmonary embolus.  Progression of mediastinal lymphadenopathy since the study from 2012  although no substantial change since 06/25/2012.  Emphysema with scattered bilateral pulmonary nodules, right greater  than left. These are unchanged since 06/25/2012 in some been stable  since 04/04/2010. Consider repeat CT without contrast in 6 months to  ensure continued stability. If     Assessment & Plan:

## 2013-02-16 NOTE — Patient Instructions (Addendum)
Plan A = Automatic = dulera 200 Take 2 puffs first thing in am and then another 2 puffs about 12 hours later and spiriva each am   Plan B= Backup = Only use your albuterol as a rescue medication to be used if you can't catch your breath by resting or doing a relaxed purse lip breathing pattern.  - The less you use it, the better it will work when you need it. - Ok to use up to 2 puffs  every 4 hours if you must but call for immediate appointment if use goes up over your usual need - Don't leave home without it !!  (think of it like the spare tire for your car)  Plan C= Crisis = you've already tried plan B and doesn't work > use Neb.  Plan D = Doctor > call us right away if you're needing use plan C more than usual.  Prednisone 10 mg take  4 each am x 2 days,   2 each am x 2 days,  1 each am x 2 days and stop   02 rx = 3lpm 24/7 but 4lpm with activity (or higher during rehab if needed to keep sats over 90%)   The key is to stop smoking completely before smoking completely stops you!

## 2013-02-16 NOTE — Progress Notes (Signed)
Kodi arrived for pulmonary rehab today c/o extreme shortness of breath, her weight is up 7.5 pounds, she did not take her Lasix yesterday and only 1/2 of her dose today.  Distant breath sounds, 1+-2+ pitting edema.  Blood pressure 110/60, heart rate 102, oxygen saturations 94% on 4 liters of oxygen.  Called Dr. Golden Pop office and was given an appointment to see Dr. Melvyn Novas and come to the office now.  Patient called her son and he picked her up to go to the office.  Patient did not exercise.

## 2013-02-16 NOTE — Assessment & Plan Note (Addendum)
-   06/30/2012 desat to 86%  RA walking 50 ft, recovered to 90% at rest - 06/30/2012  Walked 1lpm x 3 laps @ 185 ft each stopped due to  Sob, no desat - 02/16/2013  RA sats 82% at rest on 2lpm -    On 3lpm = 100%  New rx as of 02/16/2013 = 3lpm 24/7 but 4lpm with activity

## 2013-02-18 ENCOUNTER — Encounter (HOSPITAL_COMMUNITY)
Admission: RE | Admit: 2013-02-18 | Discharge: 2013-02-18 | Disposition: A | Discharge status: Home / Self Care | Payer: Medicare Other | Source: Ambulatory Visit | Attending: Internal Medicine | Admitting: Internal Medicine

## 2013-02-18 NOTE — Assessment & Plan Note (Signed)
FEV-1 in 2008 was 63% with a diffusion capacity of 33%.  fev1 in 2014: was 49% with dlco 21  GOLD III criteria. DDX of  difficult airways managment all start with A and  include Adherence, Ace Inhibitors, Acid Reflux, Active Sinus Disease, Alpha 1 Antitripsin deficiency, Anxiety masquerading as Airways dz,  ABPA,  allergy(esp in young), Aspiration (esp in elderly), Adverse effects of DPI,  Active smokers, plus two Bs  = Bronchiectasis and Beta blocker use..and one C= CHF   Adherence is always the initial "prime suspect" and is a multilayered concern that requires a "trust but verify" approach in every patient - starting with knowing how to use medications, especially inhalers, correctly, keeping up with refills and understanding the fundamental difference between maintenance and prns vs those medications only taken for a very short course and then stopped and not refilled.  - The proper method of use, as well as anticipated side effects, of a metered-dose inhaler are discussed and demonstrated to the patient. Improved effectiveness after extensive coaching during this visit to a level of approximately  75%     Each maintenance medication was reviewed in detail including most importantly the difference between maintenance and as needed and under what circumstances the prns are to be used.  Please see instructions for details which were reviewed in writing and the patient given a copy.

## 2013-02-18 NOTE — Assessment & Plan Note (Signed)

## 2013-02-19 ENCOUNTER — Other Ambulatory Visit: Payer: Self-pay | Admitting: Internal Medicine

## 2013-02-22 MED ORDER — FUROSEMIDE 40 MG PO TABS: 40.0000 mg | ORAL_TABLET | Freq: Every day | ORAL | Status: DC

## 2013-02-23 ENCOUNTER — Encounter (HOSPITAL_COMMUNITY)
Admission: RE | Admit: 2013-02-23 | Discharge: 2013-02-23 | Disposition: A | Discharge status: Home / Self Care | Payer: Medicare Other | Source: Ambulatory Visit | Attending: Internal Medicine | Admitting: Internal Medicine

## 2013-02-23 DIAGNOSIS — J449 Chronic obstructive pulmonary disease, unspecified: Secondary | ICD-10-CM | POA: Insufficient documentation

## 2013-02-23 DIAGNOSIS — I5032 Chronic diastolic (congestive) heart failure: Secondary | ICD-10-CM | POA: Insufficient documentation

## 2013-02-23 DIAGNOSIS — J9610 Chronic respiratory failure, unspecified whether with hypoxia or hypercapnia: Secondary | ICD-10-CM | POA: Insufficient documentation

## 2013-02-23 DIAGNOSIS — I509 Heart failure, unspecified: Secondary | ICD-10-CM | POA: Diagnosis not present

## 2013-02-23 DIAGNOSIS — R918 Other nonspecific abnormal finding of lung field: Secondary | ICD-10-CM | POA: Diagnosis not present

## 2013-02-23 DIAGNOSIS — Z5189 Encounter for other specified aftercare: Secondary | ICD-10-CM | POA: Diagnosis not present

## 2013-02-23 DIAGNOSIS — J4489 Other specified chronic obstructive pulmonary disease: Secondary | ICD-10-CM | POA: Insufficient documentation

## 2013-02-23 NOTE — Progress Notes (Signed)
Smoking cessation counseling done today, patient given a handout and a quit smart cigarette.  Patient receptive, but I do not think her desire to quit is very high.

## 2013-02-25 ENCOUNTER — Telehealth (HOSPITAL_COMMUNITY): Payer: Self-pay | Admitting: Internal Medicine

## 2013-02-25 ENCOUNTER — Encounter (HOSPITAL_COMMUNITY): Payer: Medicare Other

## 2013-03-02 ENCOUNTER — Encounter (HOSPITAL_COMMUNITY): Payer: Medicare Other

## 2013-03-04 ENCOUNTER — Encounter (HOSPITAL_COMMUNITY)
Admission: RE | Admit: 2013-03-04 | Discharge: 2013-03-04 | Disposition: A | Discharge status: Home / Self Care | Payer: Medicare Other | Source: Ambulatory Visit | Attending: Internal Medicine | Admitting: Internal Medicine

## 2013-03-04 NOTE — Progress Notes (Signed)
[name] 68 y.o. female Nutrition Note Spoke with pt. Pt is obese. There are many ways the pt can make her eating habits healthier. Pt commented "your going to leave me with nothing to eat. I'm already watching salt and cholesterol for my heart." Pt's Rate Your Plate results reviewed with pt. After discussion, pt realized she is already doing a few things suggested. Pt avoids some salty food; uses canned food and "rinses them" and uses convenience food (e.g. Rice-a-roni and canned ravioli).  Pt states she does not add salt to food and uses many recommended low-sodium seasonings (e.g. Garlic powder, onion powder). The role of sodium in lung disease reviewed with pt. Pt has difficulty shopping and cooking, which her son that lives with her is now doing "the majority of the time." Pt states "sometimes we run short on food." Pt previously tried a food bank and did not have a pleasant experience. Financial difficulty buying food and community resources that may offer a positive experience discussed. Pt expressed understanding of the information reviewed.   Nutrition Diagnosis   Excessive sodium intake related to over consumption of processed food as evidenced by frequent consumption of convenience food/ canned vegetables.   Food-and nutrition-related knowledge deficit related to lack of exposure to information as related to diagnosis of pulmonary disease   Obesity related to excessive energy intake as evidenced by a BMI of 40.2 Nutrition Rx/Est. Daily Nutrition Needs for: ? wt loss 1200-1700 Kcal  85-100 gm protein   1500 mg or less sodium      Nutrition Intervention   Pt's individual nutrition plan and goals reviewed with pt.   Benefits of adopting healthy eating habits discussed when pt's Rate Your Plate reviewed.   Handout given for: [employer] for [employer] with [employer]   Pt to attend the Nutrition and Lung Disease class   Continual client-centered nutrition education by RD, as  part of interdisciplinary care. Goal(s) 1. Pt to identify and limit food sources of sodium. 2. Identify food quantities necessary to achieve wt loss of  -2# per week to a goal wt of 92-100.2 kg (202-220 lb) at graduation from pulmonary rehab. 3. Describe the benefit of including fruits, vegetables, whole grains, and low-fat dairy products in a healthy meal plan. Monitor and Evaluate progress toward nutrition goal with team.   Derek Mound, M.Ed, RD, LDN, CDE 03/04/2013 3:06 PM

## 2013-03-04 NOTE — Progress Notes (Signed)
Patient arrived to pulmonary rehab today and desaturated to 74% on 3L with very, very minimal activity today.  She was not able to walk even 100 ft without this occurring.  She did not exercise today due to this.  After 3 minutes of purse lip breathing they came up to 92% on 3L of oxygen.  She was able to maintain her oxygen saturations while at rest, but desaturates with any activity.  Lungs with decreased breath sounds, but clear otherwise.  She has an appointment with Dr. [name] tomorrow, she will discuss with him the events of today.

## 2013-03-05 ENCOUNTER — Ambulatory Visit: Payer: Medicare Other | Admitting: Internal Medicine

## 2013-03-09 ENCOUNTER — Encounter (HOSPITAL_COMMUNITY): Payer: Medicare Other

## 2013-03-10 ENCOUNTER — Other Ambulatory Visit: Payer: Self-pay | Admitting: Internal Medicine

## 2013-03-10 NOTE — Telephone Encounter (Signed)
Rx called in to pharmacy. 

## 2013-03-10 NOTE — Telephone Encounter (Signed)
Please phone in xanax 

## 2013-03-10 NOTE — Telephone Encounter (Signed)
Last filled 02/10/13--please advise

## 2013-03-11 ENCOUNTER — Encounter (HOSPITAL_COMMUNITY): Payer: Medicare Other

## 2013-03-11 ENCOUNTER — Ambulatory Visit: Payer: Medicare Other | Admitting: Internal Medicine

## 2013-03-12 ENCOUNTER — Telehealth: Payer: Self-pay | Admitting: Internal Medicine

## 2013-03-12 NOTE — Telephone Encounter (Signed)
Likely mild aecopd You have mild attack of copd called COPD exacerbation Please take doxycycline 100mg  twice daily after meals x 5 days; avoid sunlight Please take Take prednisone 40 mg daily x 2 days, then 20mg  daily x 2 days, then 10mg  daily x 2 days, then 5mg  daily x 2 days and stop   Ok for home health care but for what?  Dr. Brand Males, M.D., [name].C.P Pulmonary and Critical Care Medicine Staff Physician [facility] Pager: [phone], If no answer or between  15:00h - 7:00h: call [phone]  03/12/2013 2:44 PM

## 2013-03-12 NOTE — Telephone Encounter (Signed)
Pt is aware of MR recs. Rx's will be sent in.  ROV has been made per her request for 03/19/13 at 2:45pm. She wants to discuss home healthcare with him then.

## 2013-03-12 NOTE — Telephone Encounter (Signed)
I called and spoke with pt. Pt does not want to come in d/t weather. She c/o prod cough w/ yellow phlem, wheezing, chest congestion x yesterday. Not taking anything OTC. Requesting recs?  Also wants to know if we cna order home health nurse for her as well. Please advise thanks  No Known Allergies

## 2013-03-14 ENCOUNTER — Inpatient Hospital Stay (HOSPITAL_COMMUNITY)
Admission: EM | Admit: 2013-03-14 | Discharge: 2013-03-17 | DRG: 190 | Disposition: A | Discharge status: Skilled Nursing Facility | Payer: Medicare Other | Attending: Internal Medicine | Admitting: Internal Medicine

## 2013-03-14 ENCOUNTER — Encounter (HOSPITAL_COMMUNITY): Payer: Self-pay | Admitting: Emergency Medicine

## 2013-03-14 ENCOUNTER — Emergency Department (HOSPITAL_COMMUNITY): Payer: Medicare Other

## 2013-03-14 DIAGNOSIS — I5032 Chronic diastolic (congestive) heart failure: Secondary | ICD-10-CM | POA: Diagnosis not present

## 2013-03-14 DIAGNOSIS — I509 Heart failure, unspecified: Secondary | ICD-10-CM | POA: Diagnosis present

## 2013-03-14 DIAGNOSIS — F411 Generalized anxiety disorder: Secondary | ICD-10-CM

## 2013-03-14 DIAGNOSIS — J90 Pleural effusion, not elsewhere classified: Secondary | ICD-10-CM | POA: Diagnosis not present

## 2013-03-14 DIAGNOSIS — R000 Tachycardia, unspecified: Secondary | ICD-10-CM

## 2013-03-14 DIAGNOSIS — J189 Pneumonia, unspecified organism: Secondary | ICD-10-CM | POA: Diagnosis not present

## 2013-03-14 DIAGNOSIS — J9859 Other diseases of mediastinum, not elsewhere classified: Secondary | ICD-10-CM

## 2013-03-14 DIAGNOSIS — Z720 Tobacco use: Secondary | ICD-10-CM

## 2013-03-14 DIAGNOSIS — Z79899 Other long term (current) drug therapy: Secondary | ICD-10-CM

## 2013-03-14 DIAGNOSIS — IMO0001 Reserved for inherently not codable concepts without codable children: Secondary | ICD-10-CM | POA: Diagnosis not present

## 2013-03-14 DIAGNOSIS — E669 Obesity, unspecified: Secondary | ICD-10-CM | POA: Diagnosis present

## 2013-03-14 DIAGNOSIS — J441 Chronic obstructive pulmonary disease with (acute) exacerbation: Principal | ICD-10-CM

## 2013-03-14 DIAGNOSIS — R262 Difficulty in walking, not elsewhere classified: Secondary | ICD-10-CM | POA: Diagnosis not present

## 2013-03-14 DIAGNOSIS — R0602 Shortness of breath: Secondary | ICD-10-CM | POA: Diagnosis not present

## 2013-03-14 DIAGNOSIS — F17200 Nicotine dependence, unspecified, uncomplicated: Secondary | ICD-10-CM | POA: Diagnosis present

## 2013-03-14 DIAGNOSIS — J9610 Chronic respiratory failure, unspecified whether with hypoxia or hypercapnia: Secondary | ICD-10-CM

## 2013-03-14 DIAGNOSIS — M6281 Muscle weakness (generalized): Secondary | ICD-10-CM | POA: Diagnosis not present

## 2013-03-14 DIAGNOSIS — Z6838 Body mass index (BMI) 38.0-38.9, adult: Secondary | ICD-10-CM

## 2013-03-14 DIAGNOSIS — J158 Pneumonia due to other specified bacteria: Secondary | ICD-10-CM | POA: Diagnosis not present

## 2013-03-14 DIAGNOSIS — R1312 Dysphagia, oropharyngeal phase: Secondary | ICD-10-CM | POA: Diagnosis not present

## 2013-03-14 DIAGNOSIS — I10 Essential (primary) hypertension: Secondary | ICD-10-CM | POA: Diagnosis not present

## 2013-03-14 DIAGNOSIS — Z8541 Personal history of malignant neoplasm of cervix uteri: Secondary | ICD-10-CM

## 2013-03-14 DIAGNOSIS — I5033 Acute on chronic diastolic (congestive) heart failure: Secondary | ICD-10-CM | POA: Diagnosis present

## 2013-03-14 DIAGNOSIS — F4322 Adjustment disorder with anxiety: Secondary | ICD-10-CM

## 2013-03-14 DIAGNOSIS — J449 Chronic obstructive pulmonary disease, unspecified: Secondary | ICD-10-CM

## 2013-03-14 DIAGNOSIS — R911 Solitary pulmonary nodule: Secondary | ICD-10-CM

## 2013-03-14 DIAGNOSIS — I059 Rheumatic mitral valve disease, unspecified: Secondary | ICD-10-CM

## 2013-03-14 DIAGNOSIS — E785 Hyperlipidemia, unspecified: Secondary | ICD-10-CM | POA: Diagnosis present

## 2013-03-14 LAB — BASIC METABOLIC PANEL
BUN: 7 mg/dL (ref 6–23)
CALCIUM: 9.3 mg/dL (ref 8.4–10.5)
CO2: 24 meq/L (ref 19–32)
CREATININE: 1.09 mg/dL (ref 0.50–1.10)
Chloride: 101 mEq/L (ref 96–112)
GFR calc Af Amer: 60 mL/min — ABNORMAL LOW (ref >90)
GFR, EST NON AFRICAN AMERICAN: 52 mL/min — AB (ref >90)
GLUCOSE: 125 mg/dL — AB (ref 70–99)
Potassium: 4.2 mEq/L (ref 3.7–5.3)
Sodium: 138 mEq/L (ref 137–147)

## 2013-03-14 LAB — CBC
HCT: 38.4 % (ref 36.0–46.0)
Hemoglobin: 11.3 g/dL — ABNORMAL LOW (ref 12.0–15.0)
MCH: 22.7 pg — AB (ref 26.0–34.0)
MCHC: 29.4 g/dL — ABNORMAL LOW (ref 30.0–36.0)
MCV: 77.1 fL — AB (ref 78.0–100.0)
PLATELETS: 340 10*3/uL (ref 150–400)
RBC: 4.98 MIL/uL (ref 3.87–5.11)
RDW: 18.4 % — AB (ref 11.5–15.5)
WBC: 6.9 10*3/uL (ref 4.0–10.5)

## 2013-03-14 LAB — I-STAT TROPONIN, ED: TROPONIN I, POC: 0.02 ng/mL (ref 0.00–0.08)

## 2013-03-14 LAB — PRO B NATRIURETIC PEPTIDE: Pro B Natriuretic peptide (BNP): 29.8 pg/mL (ref 0–125)

## 2013-03-14 MED ORDER — ALBUTEROL SULFATE HFA 108 (90 BASE) MCG/ACT IN AERS: 2.0000 | INHALATION_SPRAY | Freq: Four times a day (QID) | RESPIRATORY_TRACT | Status: DC | PRN

## 2013-03-14 MED ORDER — FLUTICASONE PROPIONATE 50 MCG/ACT NA SUSP
2.0000 | Freq: Every day | NASAL | Status: DC | PRN
  Administered 2013-03-16 – 2013-03-17 (×2): 2 via NASAL
  Filled 2013-03-14: qty 16

## 2013-03-14 MED ORDER — ALBUTEROL SULFATE (2.5 MG/3ML) 0.083% IN NEBU
5.0000 mg | INHALATION_SOLUTION | Freq: Once | RESPIRATORY_TRACT | Status: AC
  Administered 2013-03-14: 5 mg via RESPIRATORY_TRACT
  Filled 2013-03-14: qty 6

## 2013-03-14 MED ORDER — PANTOPRAZOLE SODIUM 40 MG PO TBEC
40.0000 mg | DELAYED_RELEASE_TABLET | Freq: Every day | ORAL | Status: DC
  Administered 2013-03-14 – 2013-03-17 (×4): 40 mg via ORAL
  Filled 2013-03-14 (×4): qty 1

## 2013-03-14 MED ORDER — ALBUTEROL SULFATE (2.5 MG/3ML) 0.083% IN NEBU
2.5000 mg | INHALATION_SOLUTION | RESPIRATORY_TRACT | Status: DC | PRN
  Administered 2013-03-14: 2.5 mg via RESPIRATORY_TRACT
  Filled 2013-03-14: qty 3

## 2013-03-14 MED ORDER — SIMVASTATIN 10 MG PO TABS
10.0000 mg | ORAL_TABLET | Freq: Every day | ORAL | Status: DC
  Administered 2013-03-14 – 2013-03-16 (×3): 10 mg via ORAL
  Filled 2013-03-14 (×4): qty 1

## 2013-03-14 MED ORDER — NICOTINE 7 MG/24HR TD PT24
7.0000 mg | MEDICATED_PATCH | Freq: Every day | TRANSDERMAL | Status: DC
  Filled 2013-03-14 (×4): qty 1

## 2013-03-14 MED ORDER — HEPARIN SODIUM (PORCINE) 5000 UNIT/ML IJ SOLN
5000.0000 [IU] | Freq: Three times a day (TID) | INTRAMUSCULAR | Status: DC
  Administered 2013-03-14 – 2013-03-17 (×10): 5000 [IU] via SUBCUTANEOUS
  Filled 2013-03-14 (×13): qty 1

## 2013-03-14 MED ORDER — LIVING BETTER WITH HEART FAILURE BOOK
Freq: Once | Status: AC
  Administered 2013-03-14: 13:00:00
  Filled 2013-03-14: qty 1

## 2013-03-14 MED ORDER — METHYLPREDNISOLONE SODIUM SUCC 40 MG IJ SOLR
40.0000 mg | Freq: Two times a day (BID) | INTRAMUSCULAR | Status: DC
  Administered 2013-03-14 – 2013-03-15 (×3): 40 mg via INTRAVENOUS
  Filled 2013-03-14 (×4): qty 1

## 2013-03-14 MED ORDER — LEVOFLOXACIN IN D5W 750 MG/150ML IV SOLN: 750.0000 mg | INTRAVENOUS | Status: DC

## 2013-03-14 MED ORDER — IPRATROPIUM-ALBUTEROL 0.5-2.5 (3) MG/3ML IN SOLN
3.0000 mL | Freq: Once | RESPIRATORY_TRACT | Status: AC
  Administered 2013-03-14: 3 mL via RESPIRATORY_TRACT
  Filled 2013-03-14: qty 3

## 2013-03-14 MED ORDER — MOMETASONE FURO-FORMOTEROL FUM 200-5 MCG/ACT IN AERO
2.0000 | INHALATION_SPRAY | Freq: Two times a day (BID) | RESPIRATORY_TRACT | Status: DC
  Administered 2013-03-14 – 2013-03-17 (×7): 2 via RESPIRATORY_TRACT
  Filled 2013-03-14: qty 8.8

## 2013-03-14 MED ORDER — METHYLPREDNISOLONE SODIUM SUCC 40 MG IJ SOLR
40.0000 mg | Freq: Two times a day (BID) | INTRAMUSCULAR | Status: DC
  Filled 2013-03-14: qty 1

## 2013-03-14 MED ORDER — SODIUM CHLORIDE 0.9 % IJ SOLN
3.0000 mL | Freq: Two times a day (BID) | INTRAMUSCULAR | Status: DC
  Administered 2013-03-14 – 2013-03-16 (×6): 3 mL via INTRAVENOUS

## 2013-03-14 MED ORDER — LEVALBUTEROL HCL 0.63 MG/3ML IN NEBU
0.6300 mg | INHALATION_SOLUTION | RESPIRATORY_TRACT | Status: DC | PRN
  Administered 2013-03-14 – 2013-03-16 (×4): 0.63 mg via RESPIRATORY_TRACT
  Filled 2013-03-14 (×4): qty 3

## 2013-03-14 MED ORDER — CEFUROXIME AXETIL 500 MG PO TABS
500.0000 mg | ORAL_TABLET | Freq: Two times a day (BID) | ORAL | Status: DC
  Administered 2013-03-14 – 2013-03-17 (×6): 500 mg via ORAL
  Filled 2013-03-14 (×8): qty 1

## 2013-03-14 MED ORDER — FUROSEMIDE 40 MG PO TABS
40.0000 mg | ORAL_TABLET | Freq: Every day | ORAL | Status: DC
  Administered 2013-03-14 – 2013-03-17 (×4): 40 mg via ORAL
  Filled 2013-03-14 (×4): qty 1

## 2013-03-14 MED ORDER — NICOTINE 21 MG/24HR TD PT24: 21.0000 mg | MEDICATED_PATCH | Freq: Every day | TRANSDERMAL | Status: DC

## 2013-03-14 MED ORDER — LEVOFLOXACIN IN D5W 750 MG/150ML IV SOLN
750.0000 mg | Freq: Once | INTRAVENOUS | Status: AC
  Administered 2013-03-14: 750 mg via INTRAVENOUS
  Filled 2013-03-14: qty 150

## 2013-03-14 MED ORDER — ALBUTEROL SULFATE (2.5 MG/3ML) 0.083% IN NEBU: 2.5000 mg | INHALATION_SOLUTION | Freq: Four times a day (QID) | RESPIRATORY_TRACT | Status: DC | PRN

## 2013-03-14 MED ORDER — TIOTROPIUM BROMIDE MONOHYDRATE 18 MCG IN CAPS
18.0000 ug | ORAL_CAPSULE | Freq: Every day | RESPIRATORY_TRACT | Status: DC
  Administered 2013-03-14 – 2013-03-17 (×4): 18 ug via RESPIRATORY_TRACT
  Filled 2013-03-14: qty 5

## 2013-03-14 MED ORDER — FAMOTIDINE 20 MG PO TABS
20.0000 mg | ORAL_TABLET | Freq: Every day | ORAL | Status: DC
  Administered 2013-03-14 – 2013-03-16 (×3): 20 mg via ORAL
  Filled 2013-03-14 (×4): qty 1

## 2013-03-14 MED ORDER — CLONIDINE HCL 0.1 MG PO TABS
0.1000 mg | ORAL_TABLET | Freq: Two times a day (BID) | ORAL | Status: DC
  Administered 2013-03-14 – 2013-03-17 (×7): 0.1 mg via ORAL
  Filled 2013-03-14 (×8): qty 1

## 2013-03-14 MED ORDER — ALPRAZOLAM 0.25 MG PO TABS
0.5000 mg | ORAL_TABLET | Freq: Four times a day (QID) | ORAL | Status: DC | PRN
  Administered 2013-03-14 – 2013-03-17 (×9): 0.5 mg via ORAL
  Filled 2013-03-14 (×9): qty 2

## 2013-03-14 MED ORDER — METHYLPREDNISOLONE SODIUM SUCC 125 MG IJ SOLR
125.0000 mg | Freq: Once | INTRAMUSCULAR | Status: AC
  Administered 2013-03-14: 125 mg via INTRAVENOUS
  Filled 2013-03-14: qty 2

## 2013-03-14 MED ORDER — AZITHROMYCIN 500 MG PO TABS
500.0000 mg | ORAL_TABLET | Freq: Every day | ORAL | Status: DC
  Administered 2013-03-14 – 2013-03-17 (×4): 500 mg via ORAL
  Filled 2013-03-14 (×4): qty 1

## 2013-03-14 MED ORDER — LOSARTAN POTASSIUM 25 MG PO TABS
25.0000 mg | ORAL_TABLET | Freq: Every day | ORAL | Status: DC
  Administered 2013-03-14 – 2013-03-17 (×4): 25 mg via ORAL
  Filled 2013-03-14 (×4): qty 1

## 2013-03-14 MED ORDER — METHYLPREDNISOLONE SODIUM SUCC 125 MG IJ SOLR
60.0000 mg | Freq: Two times a day (BID) | INTRAMUSCULAR | Status: DC
  Filled 2013-03-14 (×2): qty 0.96

## 2013-03-14 MED ORDER — ALPRAZOLAM 0.25 MG PO TABS
0.5000 mg | ORAL_TABLET | Freq: Two times a day (BID) | ORAL | Status: DC | PRN
  Administered 2013-03-14: 0.5 mg via ORAL
  Filled 2013-03-14: qty 2

## 2013-03-14 NOTE — H&P (Addendum)
[facility] History and Physical  [name] ZOX:[id number] DOB: 03-28-1945 DOA: 03/14/2013  Referring physician: EDP PCP: Webb Silversmith, NP   Chief Complaint: SOB   HPI: [name] is a 68 y.o. female who presents to the ED with worsening SOB.  Patient has extensive history of COPD (sees pulmonologist, in pulmonary rehab, numerous admits for COPD in past, still smokes a few cigs a day etc).  Her symptoms have been worsening for a couple of days, called pulmonologist 2 days ago and made appointment for 2/27.  Unfortunately, breathing was worse today with increase in non-productive cough.  No fevers nor chills.  She tried to use her inhaler at home without much relief.  No known sick contacts.  She is requiring a ventimask to maintain sats in the ED.  Review of Systems: Systems reviewed.  As above, otherwise negative  Past Medical History  Diagnosis Date  . Hypertension   . COPD (chronic obstructive pulmonary disease)      FEV-1 in 2008 was 63% with a diffusion capacity of 33%.   . Tobacco abuse      Smokes one pack a day since age 20.  . Diastolic dysfunction   . History of cervical cancer   . Snoring disorder   . Pulmonary nodule march 2012    29mm RUL and RLL march 2012 CT  . 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 Bronchiectasis march 2012    On CT chest. RUL. Mild  . Medical non-compliance   . COPD exacerbation     march 2012  . Chicken pox   . Seasonal allergies   . Arrhythmia   . Cancer cervical ca 1982  . CHF (congestive heart failure)   . Hyperlipidemia   . Tobacco abuse    Past Surgical History  Procedure Laterality Date  . Total abdominal hysterectomy     Social History:  reports that she has been smoking Cigarettes.  She has a 22 pack-year smoking history. She has never used smokeless tobacco. She reports that she drinks alcohol. She reports that she does not use illicit drugs.  No Known Allergies  Family  History  Problem Relation Age of Onset  . Other Father     MVA  . Esophageal cancer Mother      Prior to Admission medications   Medication Sig Start Date End Date Taking? Authorizing Provider  albuterol (PROVENTIL HFA;VENTOLIN HFA) 108 (90 BASE) MCG/ACT inhaler Inhale 2 puffs into the lungs every 6 (six) hours as needed.   04/24/10 06/22/13 Yes Brand Males, MD  albuterol (PROVENTIL) (2.5 MG/3ML) 0.083% nebulizer solution Take 3 mLs (2.5 mg total) by nebulization every 6 (six) hours as needed for wheezing. Dx 496 02/05/13  Yes Brand Males, MD  ALPRAZolam Duanne Moron) 0.5 MG tablet Take 0.5 mg by mouth 2 (two) times daily as needed for anxiety.   Yes Historical Provider, MD  cloNIDine (CATAPRES) 0.1 MG tablet Take 0.1 mg by mouth 2 (two) times daily.   Yes Historical Provider, MD  famotidine (PEPCID) 20 MG tablet Take 1 tablet (20 mg total) by mouth at bedtime. 07/16/12  Yes Tammy S Parrett, NP  fluticasone (FLONASE) 50 MCG/ACT nasal spray Place 2 sprays into both nostrils daily as needed for allergies or rhinitis.   Yes Historical Provider, MD  furosemide (LASIX) 40 MG tablet Take 1 tablet (40 mg total) by mouth daily. 02/10/13  Yes Webb Silversmith, NP  losartan (COZAAR)  25 MG tablet Take 1 tablet (25 mg total) by mouth daily. 09/15/12  Yes Kalman Shan, MD  mometasone-formoterol (DULERA) 200-5 MCG/ACT AERO Inhale 2 puffs into the lungs 2 (two) times daily.   Yes Historical Provider, MD  omeprazole (PRILOSEC) 20 MG capsule Take 1 capsule (20 mg total) by mouth daily. 07/16/12  Yes Tammy S Parrett, NP  simvastatin (ZOCOR) 10 MG tablet Take 1 tablet (10 mg total) by mouth at bedtime. 08/31/12  Yes Nicki Reaper, NP  tiotropium (SPIRIVA) 18 MCG inhalation capsule Place 18 mcg into inhaler and inhale daily.   Yes Historical Provider, MD   Physical Exam: Filed Vitals:   03/14/13 0256  BP: 106/71  Pulse: 94  Temp: 98.3 F (36.8 C)  Resp: 16    BP 106/71  Pulse 94  Temp(Src) 98.3 F (36.8 C)  (Oral)  Resp 16  SpO2 95%  General Appearance:    Alert, oriented, no distress, appears stated age  Head:    Normocephalic, atraumatic  Eyes:    PERRL, EOMI, sclera non-icteric        Nose:   Nares without drainage or epistaxis. Mucosa, turbinates normal  Throat:   Moist mucous membranes. Oropharynx without erythema or exudate.  Neck:   Supple. No carotid bruits.  No thyromegaly.  No lymphadenopathy.   Back:     No CVA tenderness, no spinal tenderness  Lungs:     Diminished breath sounds and wheezing throughout  Chest wall:    No tenderness to palpitation  Heart:    Regular rate and rhythm without murmurs, gallops, rubs  Abdomen:     Soft, non-tender, nondistended, normal bowel sounds, no organomegaly  Genitalia:    deferred  Rectal:    deferred  Extremities:   No clubbing, cyanosis or edema.  Pulses:   2+ and symmetric all extremities  Skin:   Skin color, texture, turgor normal, no rashes or lesions  Lymph nodes:   Cervical, supraclavicular, and axillary nodes normal  Neurologic:   CNII-XII intact. Normal strength, sensation and reflexes      throughout    Labs on Admission:  Basic Metabolic Panel:  Recent Labs Lab 03/14/13 0304  NA 138  K 4.2  CL 101  CO2 24  GLUCOSE 125*  BUN 7  CREATININE 1.09  CALCIUM 9.3   Liver Function Tests: No results found for this basename: AST, ALT, ALKPHOS, BILITOT, PROT, ALBUMIN,  in the last 168 hours No results found for this basename: LIPASE, AMYLASE,  in the last 168 hours No results found for this basename: AMMONIA,  in the last 168 hours CBC:  Recent Labs Lab 03/14/13 0304  WBC 6.9  HGB 11.3*  HCT 38.4  MCV 77.1*  PLT 340   Cardiac Enzymes: No results found for this basename: CKTOTAL, CKMB, CKMBINDEX, TROPONINI,  in the last 168 hours  BNP (last 3 results)  Recent Labs  07/16/12 1728 03/14/13 0305  PROBNP 25.0 29.8   CBG: No results found for this basename: GLUCAP,  in the last 168 hours  Radiological Exams on  Admission: Dg Chest Portable 1 View  03/14/2013   CLINICAL DATA:  Shortness of breath.  History of smoking.  EXAM: PORTABLE CHEST - 1 VIEW  COMPARISON:  Chest radiograph and CTA of the chest performed 01/11/2013  FINDINGS: The lungs are well-aerated. Bibasilar airspace opacification may reflect pulmonary edema or multifocal pneumonia. Underlying vascular congestion is seen. A small left pleural effusion is seen. No pneumothorax is identified.  The cardiomediastinal silhouette is mildly enlarged. No acute osseous abnormalities are seen.  IMPRESSION: Vascular congestion and mild cardiomegaly noted. Bibasilar airspace opacification may reflect pulmonary edema or multifocal pneumonia. Small left pleural effusion seen.   Electronically Signed   By: Garald Balding M.D.   On: 03/14/2013 03:27    EKG: Independently reviewed.  Assessment/Plan Principal Problem:   COPD exacerbation Active Problems:   HYPERTENSION   1. COPD exacerbation - admitting patient, steroids, adult wheeze protocol, empiric levaquin, all ordered as per standard.  Nicotine patch to try and get her to quit smoking! (discussed this at length with patient).  [name] is her pulmonologist. 2. HTN - continue home meds   Code Status: Full Code  Family Communication: No family in room Disposition Plan: Admit to inpatient   Time spent: 50 min  Darilyn Storbeck M. [facility] Pager [phone]  If 7AM-7PM, please contact the day team taking care of the patient [email or web address] Password [facility] 03/14/2013, 5:01 AM

## 2013-03-14 NOTE — Progress Notes (Signed)
Followup note:  Patient admitted earlier this morning. Seen in her room at lunchtime. Extensive talk. Breathing is better, close to baseline, although she physical brought her breathing is poor. She admits to still smoking.  COPD exacerbation: Lung exam notes prolonged expiratory phase, but minimal if any wheezing.  Decrease steroids and change antibiotics to by mouth.  Will also refer her to [facility] case management.  Diastolic CHF: Grade 1 diastolic dysfunction on previous echo. BNP normal. The patient some clinical information about this. Already on ACE inhibitor and Lasix. No beta blockers secondary to his advanced COPD.  Tobacco abuse: Council patient extensively. She had some concerns about the nicotine patch causing harm, but doesn't that she will indeed really been motivated to try to quit.

## 2013-03-14 NOTE — ED Provider Notes (Signed)
CSN: [id number]     Arrival date & time 03/14/13  0249 History   First MD Initiated Contact with Patient 03/14/13 0327     Chief Complaint  Patient presents with  . Shortness of Breath     (Consider location/radiation/quality/duration/timing/severity/associated sxs/prior Treatment) HPI History provided by patient. Wheezing and shortness of breath worse today with history of COPD. Patient using her inhaler at home without relief. She denies any fevers or chills. No new cough. No productive sputum. She tried over-the-counter medications without relief. She continues to smoke. Symptoms moderate in severity, worse with exertion. No leg pain or swelling. No chest pain. No known sick contacts.  Past Medical History  Diagnosis Date  . Hypertension   . COPD (chronic obstructive pulmonary disease)      FEV-1 in 2008 was 63% with a diffusion capacity of 33%.   . Tobacco abuse      Smokes one pack a day since age 83.  . Diastolic dysfunction   . History of cervical cancer   . Snoring disorder   . Pulmonary nodule march 2012    54mm RUL and RLL march 2012 CT  . 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 Bronchiectasis march 2012    On CT chest. RUL. Mild  . Medical non-compliance   . COPD exacerbation     march 2012  . Chicken pox   . Seasonal allergies   . Arrhythmia   . Cancer cervical ca 1982  . CHF (congestive heart failure)   . Hyperlipidemia   . Tobacco abuse    Past Surgical History  Procedure Laterality Date  . Total abdominal hysterectomy     Family History  Problem Relation Age of Onset  . Other Father     MVA  . Esophageal cancer Mother    History  Substance Use Topics  . Smoking status: Current Every Day Smoker -- 0.50 packs/day for 44 years    Types: Cigarettes  . Smokeless tobacco: Never Used     Comment: down to 6 cigs per day, wants to do e cigs; I cautioned against that  . Alcohol Use: Yes     Comment: occasional   OB  History   Grav Para Term Preterm Abortions TAB SAB Ect Mult Living                 Review of Systems  Constitutional: Negative for fever and chills.  Respiratory: Positive for cough, shortness of breath and wheezing.   Cardiovascular: Negative for chest pain and leg swelling.  Gastrointestinal: Negative for abdominal pain.  Genitourinary: Negative for dysuria.  Musculoskeletal: Negative for back pain, neck pain and neck stiffness.  Skin: Negative for rash.  Neurological: Negative for headaches.  All other systems reviewed and are negative.      Allergies  Review of patient's allergies indicates no known allergies.  Home Medications   Current Outpatient Rx  Name  Route  Sig  Dispense  Refill  . albuterol (PROVENTIL HFA;VENTOLIN HFA) 108 (90 BASE) MCG/ACT inhaler   Inhalation   Inhale 2 puffs into the lungs every 6 (six) hours as needed.           Marland Kitchen albuterol (PROVENTIL) (2.5 MG/3ML) 0.083% nebulizer solution   Nebulization   Take 3 mLs (2.5 mg total) by nebulization every 6 (six) hours as needed for wheezing. Dx 496   36 mL   0     Pt  is wanting to see how much this will be for cas ...   . ALPRAZolam (XANAX) 0.5 MG tablet   Oral   Take 0.5 mg by mouth 2 (two) times daily as needed for anxiety.         . cloNIDine (CATAPRES) 0.1 MG tablet   Oral   Take 0.1 mg by mouth 2 (two) times daily.         . famotidine (PEPCID) 20 MG tablet   Oral   Take 1 tablet (20 mg total) by mouth at bedtime.   30 tablet   5   . fluticasone (FLONASE) 50 MCG/ACT nasal spray   Each Nare   Place 2 sprays into both nostrils daily as needed for allergies or rhinitis.         . furosemide (LASIX) 40 MG tablet   Oral   Take 1 tablet (40 mg total) by mouth daily.   30 tablet   0   . losartan (COZAAR) 25 MG tablet   Oral   Take 1 tablet (25 mg total) by mouth daily.   30 tablet   5   . mometasone-formoterol (DULERA) 200-5 MCG/ACT AERO   Inhalation   Inhale 2 puffs into  the lungs 2 (two) times daily.         Marland Kitchen omeprazole (PRILOSEC) 20 MG capsule   Oral   Take 1 capsule (20 mg total) by mouth daily.   30 capsule   5   . simvastatin (ZOCOR) 10 MG tablet   Oral   Take 1 tablet (10 mg total) by mouth at bedtime.   90 tablet   3   . tiotropium (SPIRIVA) 18 MCG inhalation capsule   Inhalation   Place 18 mcg into inhaler and inhale daily.          BP 106/71  Pulse 94  Temp(Src) 98.3 F (36.8 C) (Oral)  Resp 16  SpO2 95% Physical Exam  Constitutional: She is oriented to person, place, and time. She appears well-developed and well-nourished.  HENT:  Head: Normocephalic and atraumatic.  Eyes: EOM are normal. Pupils are equal, round, and reactive to light.  Neck: Neck supple. No tracheal deviation present.  Cardiovascular: Normal rate, regular rhythm and intact distal pulses.   Pulmonary/Chest: No stridor.  Tachypnea. Bilateral inspiratory and expiratory wheezes with decreased bilateral breath sounds  Abdominal: Soft. She exhibits no distension. There is no tenderness.  Musculoskeletal: Normal range of motion. She exhibits no edema and no tenderness.  Neurological: She is alert and oriented to person, place, and time.  Skin: Skin is warm and dry.    ED Course  Procedures (including critical care time) Labs Review Labs Reviewed  BASIC METABOLIC PANEL - Abnormal; Notable for the following:    Glucose, Bld 125 (*)    GFR calc non Af Amer 52 (*)    GFR calc Af Amer 60 (*)    All other components within normal limits  CBC - Abnormal; Notable for the following:    Hemoglobin 11.3 (*)    MCV 77.1 (*)    MCH 22.7 (*)    MCHC 29.4 (*)    RDW 18.4 (*)    All other components within normal limits  PRO B NATRIURETIC PEPTIDE  I-STAT TROPOININ, ED   Imaging Review Dg Chest Portable 1 View  03/14/2013   CLINICAL DATA:  Shortness of breath.  History of smoking.  EXAM: PORTABLE CHEST - 1 VIEW  COMPARISON:  Chest radiograph and CTA  of the chest  performed 01/11/2013  FINDINGS: The lungs are well-aerated. Bibasilar airspace opacification may reflect pulmonary edema or multifocal pneumonia. Underlying vascular congestion is seen. A small left pleural effusion is seen. No pneumothorax is identified.  The cardiomediastinal silhouette is mildly enlarged. No acute osseous abnormalities are seen.  IMPRESSION: Vascular congestion and mild cardiomegaly noted. Bibasilar airspace opacification may reflect pulmonary edema or multifocal pneumonia. Small left pleural effusion seen.   Electronically Signed   By: Garald Balding M.D.   On: 03/14/2013 03:27    EKG Interpretation    Date/Time:  Sunday March 14 2013 02:53:08 EST Ventricular Rate:  118 PR Interval:  140 QRS Duration: 66 QT Interval:  330 QTC Calculation: 462 R Axis:   81 Text Interpretation:  Sinus tachycardia Artifact No significant change since last tracing Confirmed by Dandria Griego  MD, Yafet Cline [phone]) on 03/14/2013 3:45:29 AM           IV Solu-Medrol. IV Levaquin. Albuterol breathing treatments provided with serial evaluations and minimal improvement. Medicine consulted and Dr. Alcario Drought will admit  MDM   Diagnosis: COPD exacerbation  EKG, labs and chest x-ray reviewed as above. Antibiotics provided for chest x-ray findings but no infectious symptoms otherwise Steroids and multiple breathing treatments provided. Medical admission    Teressa Lower, MD 03/14/13 [phone]

## 2013-03-14 NOTE — ED Notes (Signed)
Pt. reports worsening SOB /dry cough onset yesterday unrelieved by OTC cough medications , pt. stated history of COPD / heavy smoker.

## 2013-03-14 NOTE — Progress Notes (Signed)
Called to pt's room to administer rescue inhaler, advised RN that we only do maintenance inhalers but I could give a PRN neb tx. Pt in no obvious respiratory distress at time of my assessment. Gave PRN albuterol neb, pt tolerated fine. RT will continue to monitor.

## 2013-03-14 NOTE — Progress Notes (Signed)
Utilization review completed.  

## 2013-03-15 ENCOUNTER — Telehealth: Payer: Self-pay | Admitting: Internal Medicine

## 2013-03-15 ENCOUNTER — Encounter (HOSPITAL_COMMUNITY): Payer: Self-pay | Admitting: General Practice

## 2013-03-15 DIAGNOSIS — E669 Obesity, unspecified: Secondary | ICD-10-CM | POA: Diagnosis present

## 2013-03-15 DIAGNOSIS — F411 Generalized anxiety disorder: Secondary | ICD-10-CM | POA: Diagnosis present

## 2013-03-15 DIAGNOSIS — J189 Pneumonia, unspecified organism: Secondary | ICD-10-CM | POA: Diagnosis present

## 2013-03-15 DIAGNOSIS — J441 Chronic obstructive pulmonary disease with (acute) exacerbation: Secondary | ICD-10-CM | POA: Diagnosis not present

## 2013-03-15 DIAGNOSIS — I10 Essential (primary) hypertension: Secondary | ICD-10-CM

## 2013-03-15 LAB — BLOOD GAS, ARTERIAL
Acid-Base Excess: 1.5 mmol/L (ref 0.0–2.0)
Acid-Base Excess: 0.9 mmol/L (ref 0.0–2.0)
BICARBONATE: 26.4 meq/L — AB (ref 20.0–24.0)
Bicarbonate: 25.9 mEq/L — ABNORMAL HIGH (ref 20.0–24.0)
DRAWN BY: 205171
DRAWN BY: 277551
O2 CONTENT: 5 L/min
O2 Content: 5 L/min
O2 SAT: 80.8 %
O2 Saturation: 76.1 %
PATIENT TEMPERATURE: 98.6
PCO2 ART: 48.2 mmHg — AB (ref 35.0–45.0)
Patient temperature: 98.6
TCO2: 27.9 mmol/L (ref 0–100)
TCO2: 27.5 mmol/L (ref 0–100)
pCO2 arterial: 49 mmHg — ABNORMAL HIGH (ref 35.0–45.0)
pH, Arterial: 7.358 (ref 7.350–7.450)
pH, Arterial: 7.344 — ABNORMAL LOW (ref 7.350–7.450)
pO2, Arterial: 41.9 mmHg — ABNORMAL LOW (ref 80.0–100.0)
pO2, Arterial: 49.2 mmHg — ABNORMAL LOW (ref 80.0–100.0)

## 2013-03-15 MED ORDER — PREDNISONE 20 MG PO TABS
40.0000 mg | ORAL_TABLET | Freq: Every day | ORAL | Status: DC
  Administered 2013-03-16: 40 mg via ORAL
  Filled 2013-03-15 (×3): qty 2

## 2013-03-15 MED ORDER — ZOLPIDEM TARTRATE 5 MG PO TABS
5.0000 mg | ORAL_TABLET | Freq: Every day | ORAL | Status: DC
  Administered 2013-03-15: 5 mg via ORAL
  Filled 2013-03-15: qty 1

## 2013-03-15 MED ORDER — DIPHENHYDRAMINE HCL 25 MG PO CAPS: 25.0000 mg | ORAL_CAPSULE | Freq: Every day | ORAL | Status: DC

## 2013-03-15 NOTE — Telephone Encounter (Signed)
I spoke with the pharmacist at [facility].  Was advised no doxy or pred rx was called in, e scribed, or VM left for on this pt on Friday, Mar 12, 2013.

## 2013-03-15 NOTE — Progress Notes (Signed)
Patient ambulated 75 feet in the hall on 3L of oxygen. Oxygen saturation dropped down to 65% from 89%, had to sit down. Patient ambulated back to room once saturations returned to her baseline.

## 2013-03-15 NOTE — Progress Notes (Signed)
Clinical Social Work Department BRIEF PSYCHOSOCIAL ASSESSMENT 03/15/2013  Patient:  [facility]     Account Number:  [id number]     Admit date:  03/14/2013  Clinical Social Worker:  Megan Salon  Date/Time:  03/15/2013 11:48 AM  Referred by:  RN  Date Referred:  03/15/2013 Referred for  SNF Placement   Other Referral:   Interview type:  Patient Other interview type:    PSYCHOSOCIAL DATA Living Status:  FAMILY Admitted from facility:   Level of care:   Primary support name:  Lanice Schwab Primary support relationship to patient:  CHILD, ADULT Degree of support available:   Adequate    CURRENT CONCERNS Current Concerns  Post-Acute Placement   Other Concerns:    SOCIAL WORK ASSESSMENT / PLAN Per RN, patient is wanting SNF placement at dc. CSW went into room and introduced self and explained reason for visit. Patient states she is interested in SNF placement. CSW explained how SNF works and patient was agreeable. Patient states she is open to looking at [facility] in [place] area. CSW will complete FL2 for MD signature.   Assessment/plan status:  Psychosocial Support/Ongoing Assessment of Needs Other assessment/ plan:   Information/referral to community resources:   SNF placement    PATIENT'S/FAMILY'S RESPONSE TO PLAN OF CARE: Patient states she is agreeable for SNF placement in [facility].       Jeanette Caprice, MSW, [place]

## 2013-03-15 NOTE — Progress Notes (Addendum)
Clinical Social Work Department CLINICAL SOCIAL WORK PLACEMENT NOTE 03/15/2013  Patient:  [facility]  Account Number:  [id number] [place] date:  03/14/2013  Clinical Social Worker:  Megan Salon  Date/time:  03/15/2013 11:52 AM  Clinical Social Work is seeking post-discharge placement for this patient at the following level of care:   [place]   (*CSW will update this form in Epic as items are completed)   03/15/2013  Patient/family provided with [place] Department of Clinical Social Work's list of facilities offering this level of care within the geographic area requested by the patient (or if unable, by the patient's family).  03/15/2013  Patient/family informed of their freedom to choose among providers that offer the needed level of care, that participate in Medicare, Medicaid or managed care program needed by the patient, have an available bed and are willing to accept the patient.  03/15/2013  Patient/family informed of [facility]' ownership interest in [facility], as well as of the fact that they are under no obligation to receive care at this facility.  PASARR submitted to EDS on 03/15/2013 PASARR number received from EDS on 03/15/2013  FL2 transmitted to all facilities in geographic area requested by pt/family on  03/15/2013 FL2 transmitted to all facilities within larger geographic area on   Patient informed that his/her managed care company has contracts with or will negotiate with  certain facilities, including the following:     Patient/family informed of bed offers received:   Patient chooses bed at  [facility] (JS) Physician recommends and patient chooses bed at    Patient to be transferred to [facility] on 03/17/2013  (JS) Patient to be transferred to facility by  [facility]  The following physician request were entered in Epic:   Additional Comments:  Jeanette Caprice, MSW, [place]

## 2013-03-15 NOTE — Progress Notes (Signed)
I was called to give a PRN breathing TX to the patient at this time. However, once I arrived the patient says she is feeling much better and she will call if she feels she needs a [place] later. The patient is currently resting well, in no apparent distress. RT will continue to assist as needed.

## 2013-03-15 NOTE — Telephone Encounter (Signed)
Called and spoke with pt. Her ABX and prednisone was not called in on Friday an confirmed [facility] never [facility]. Pt ended up in the ED early Sunday morning and was admitted to the hospital. She reports the Dr. Vernell Barrier wants her to go to rehabilitation for about 2-3 weeks or 30 days d/t lack of energy and her SOB. She wants to know if MR thinks this is a good idea. Please advise MR thanks

## 2013-03-15 NOTE — Progress Notes (Addendum)
Decreased O2 to pts hm regimen due to stable sats. Pt has upper exp wh. PRN neb tx done

## 2013-03-15 NOTE — Telephone Encounter (Signed)
That's prednisone  .Hillery Hunter

## 2013-03-15 NOTE — Progress Notes (Signed)
[facility] PROGRESS NOTE  [name] VFI:[id number] DOB: 19-Feb-1945 DOA: 03/14/2013 PCP: Webb Silversmith, NP  Assessment/Plan: Principal Problem:   COPD exacerbation: Change Solu-Medrol to prednisone, continue antibiotics for community-acquired pneumonia, continue nebulizers and breathing treatments Active Problems:   HYPERTENSION: Continue home meds.   Tobacco abuse: Counseled to quit. On nicotine patch   Chronic diastolic heart failure: Stable. BNP normal   Generalized anxiety disorder: Continue when necessary Xanax   Obesity, unspecified Community-acquired pneumonia: On antibiotics  Code Status: Full code Family Communication: Left message with some  Disposition Plan: Skilled nursing, likely tomorrow   Consultants:  None  Procedures:  None  Antibiotics:  Ceftin and Zithromax day 2  HPI/Subjective: Patient doing okay. Breathing little easier today, overall she feels her quality breathing is poor in general. Did not sleep well last night  Objective: Filed Vitals:   03/15/13 1300  BP: 102/68  Pulse: 116  Temp: 97.4 F (36.3 C)  Resp: 19    Intake/Output Summary (Last 24 hours) at 03/15/13 1513 Last data filed at 03/15/13 1300  Gross per 24 hour  Intake    363 ml  Output   1150 ml  Net   -787 ml   Filed Weights   03/14/13 0607 03/15/13 0452  Weight: 101.742 kg (224 lb 4.8 oz) 102.241 kg (225 lb 6.4 oz)    Exam:   General:  Alert and oriented x3, less distressed today  Cardiovascular: Regular rate and rhythm, C1-Y6, soft 2/6 systolic ejection murmur  Respiratory: Decreased breath sounds throughout  Abdomen: Soft, nontender, obese, positive bowel sounds  Musculoskeletal: No clubbing or cyanosis, trace pitting edema   Data Reviewed: Basic Metabolic Panel:  Recent Labs Lab 03/14/13 0304  NA 138  K 4.2  CL 101  CO2 24  GLUCOSE 125*  BUN 7  CREATININE 1.09  CALCIUM 9.3   Liver Function Tests: No results found for this basename:  AST, ALT, ALKPHOS, BILITOT, PROT, ALBUMIN,  in the last 168 hours No results found for this basename: LIPASE, AMYLASE,  in the last 168 hours No results found for this basename: AMMONIA,  in the last 168 hours CBC:  Recent Labs Lab 03/14/13 0304  WBC 6.9  HGB 11.3*  HCT 38.4  MCV 77.1*  PLT 340   Cardiac Enzymes: No results found for this basename: CKTOTAL, CKMB, CKMBINDEX, TROPONINI,  in the last 168 hours BNP (last 3 results)  Recent Labs  07/16/12 1728 03/14/13 0305  PROBNP 25.0 29.8   CBG: No results found for this basename: GLUCAP,  in the last 168 hours  No results found for this or any previous visit (from the past 240 hour(s)).   Studies: Dg Chest Portable 1 View  03/14/2013   CLINICAL DATA:  Shortness of breath.  History of smoking.  EXAM: PORTABLE CHEST - 1 VIEW  COMPARISON:  Chest radiograph and CTA of the chest performed 01/11/2013  FINDINGS: The lungs are well-aerated. Bibasilar airspace opacification may reflect pulmonary edema or multifocal pneumonia. Underlying vascular congestion is seen. A small left pleural effusion is seen. No pneumothorax is identified.  The cardiomediastinal silhouette is mildly enlarged. No acute osseous abnormalities are seen.  IMPRESSION: Vascular congestion and mild cardiomegaly noted. Bibasilar airspace opacification may reflect pulmonary edema or multifocal pneumonia. Small left pleural effusion seen.   Electronically Signed   By: Garald Balding M.D.   On: 03/14/2013 03:27    Scheduled Meds: . azithromycin  500 mg Oral Daily  . cefUROXime  500 mg  Oral BID WC  . cloNIDine  0.1 mg Oral BID  . famotidine  20 mg Oral QHS  . furosemide  40 mg Oral Daily  . heparin  5,000 Units Subcutaneous 3 times per day  . losartan  25 mg Oral Daily  . mometasone-formoterol  2 puff Inhalation BID  . nicotine  7 mg Transdermal Daily  . pantoprazole  40 mg Oral Daily  . [START ON 03/16/2013] predniSONE  40 mg Oral Q breakfast  . simvastatin  10 mg  Oral QHS  . sodium chloride  3 mL Intravenous Q12H  . tiotropium  18 mcg Inhalation Daily  . zolpidem  5 mg Oral QHS   Continuous Infusions:   Principal Problem:   COPD exacerbation Active Problems:   HYPERTENSION   Tobacco abuse   Chronic diastolic heart failure   Generalized anxiety disorder   Obesity, unspecified    Time spent: 20 minutes    [place] [facility] Pager [phone]. If 7PM-7AM, please contact night-coverage at [email or web address], password [facility] 03/15/2013, 3:13 PM  LOS: 1 day

## 2013-03-16 ENCOUNTER — Encounter (HOSPITAL_COMMUNITY): Payer: Medicare Other

## 2013-03-16 DIAGNOSIS — I10 Essential (primary) hypertension: Secondary | ICD-10-CM | POA: Diagnosis not present

## 2013-03-16 DIAGNOSIS — I5032 Chronic diastolic (congestive) heart failure: Secondary | ICD-10-CM | POA: Diagnosis not present

## 2013-03-16 DIAGNOSIS — J441 Chronic obstructive pulmonary disease with (acute) exacerbation: Secondary | ICD-10-CM | POA: Diagnosis not present

## 2013-03-16 DIAGNOSIS — R000 Tachycardia, unspecified: Secondary | ICD-10-CM | POA: Diagnosis not present

## 2013-03-16 MED ORDER — CARVEDILOL 3.125 MG PO TABS
3.1250 mg | ORAL_TABLET | Freq: Two times a day (BID) | ORAL | Status: DC
  Administered 2013-03-16 – 2013-03-17 (×2): 3.125 mg via ORAL
  Filled 2013-03-16 (×4): qty 1

## 2013-03-16 MED ORDER — PREDNISONE 20 MG PO TABS
30.0000 mg | ORAL_TABLET | Freq: Every day | ORAL | Status: DC
  Administered 2013-03-17: 30 mg via ORAL
  Filled 2013-03-16 (×2): qty 1

## 2013-03-16 NOTE — Progress Notes (Signed)
[facility] PROGRESS NOTE  Canary Fister IOE:[id number] DOB: [birth year] DOA: 03/14/2013 PCP: Webb Silversmith, NP  Patient is a 68 year old African American female with past medical history of obesity, chronic diastolic heart failure, ongoing tobacco abuse and COPD on 3-4 L of oxygen who presented to the emergency room on the early morning hours of 2/22 with complaints of shortness of breath and cough. She was found to have a multilobar pneumonia and be in an exacerbation of her COPD.  The patient was treated with IV Solu-Medrol, antibiotics nebulizers and oxygen. A previous echo did confirm her history of diastolic dysfunction, however BNP was normal and she was continued on her home dose of Lasix. She is much improved, however still quite deconditioned, something that was going on even prior to this exacerbation. Plan is for her to go to skilled nursing facility.  We talked extensively about her quitting and while we cannot formally recommend electronic cigarettes, I told her that anything that she did which would currently stop her from taking and cigarette smoke would help, at least in the short-term.   Assessment/Plan: Principal Problem:   COPD exacerbation: Solu-Medrol changed to prednisone, continuing taper, continue antibiotics for community-acquired pneumonia, continue when necessary nebulizers/home inhalers and oxygen. Patient is improved, although not fully back to baseline, still requiring 4 L at rest. Reportedly, she is 3 L at home.  Active Problems:   Tachycardia: Still not much better.  Feels as a combination of hypoxia plus significant exertion plus nebulizers. Have changed when necessary albuterol to Xopenex. Have also added very low dose Coreg which would also help her heart failure. Monitor while she is in the hospital to see she can tolerate and it does not make her COPD worse.   HYPERTENSION: Continue home meds. Added Coreg. See above.    Tobacco abuse: Counseled to quit. On  nicotine patch    Chronic diastolic heart failure: Stable. BNP normal    Generalized anxiety disorder: Continue when necessary Xanax, increased while in hospital. Likely another contributing factor to her heart rate    Obesity, unspecified: Patient needs criteria with BMI of 38.  Community-acquired pneumonia: On antibiotics  Code Status: Full code Family Communication: Left message with her son  Disposition Plan: Skilled nursing, likely tomorrow   Consultants:  None  Procedures:  None  Antibiotics:  Ceftin and Zithromax day 3/7  HPI/Subjective: Patient doing okay. Still feels anxious. Feels like breathing is getting better though. Ambulated which did cause some dropped her oxygen saturations.  Objective: Filed Vitals:   03/16/13 0416  BP: 120/69  Pulse: 106  Temp: 97.7 F (36.5 C)  Resp: 20    Intake/Output Summary (Last 24 hours) at 03/16/13 1121 Last data filed at 03/15/13 2045  Gross per 24 hour  Intake    480 ml  Output      0 ml  Net    480 ml   Filed Weights   03/14/13 0607 03/15/13 0452 03/16/13 0416  Weight: 101.742 kg (224 lb 4.8 oz) 102.241 kg (225 lb 6.4 oz) 101.7 kg (224 lb 3.3 oz)    Exam:   General:  Alert and oriented x3, less distressed today  Cardiovascular: Regular rhythm, borderline tachycardia, H8-E9, soft 2/6 systolic ejection murmur  Respiratory: Decreased breath sounds throughout, no wheezes or crackles  Abdomen: Soft, nontender, obese, positive bowel sounds  Musculoskeletal: No clubbing or cyanosis, trace pitting edema   Data Reviewed: Basic Metabolic Panel:  Recent Labs Lab 03/14/13 0304  NA 138  K  4.2  CL 101  CO2 24  GLUCOSE 125*  BUN 7  CREATININE 1.09  CALCIUM 9.3   Liver Function Tests: No results found for this basename: AST, ALT, ALKPHOS, BILITOT, PROT, ALBUMIN,  in the last 168 hours No results found for this basename: LIPASE, AMYLASE,  in the last 168 hours No results found for this basename:  AMMONIA,  in the last 168 hours CBC:  Recent Labs Lab 03/14/13 0304  WBC 6.9  HGB 11.3*  HCT 38.4  MCV 77.1*  PLT 340   Cardiac Enzymes: No results found for this basename: CKTOTAL, CKMB, CKMBINDEX, TROPONINI,  in the last 168 hours BNP (last 3 results)  Recent Labs  07/16/12 1728 03/14/13 0305  PROBNP 25.0 29.8   CBG: No results found for this basename: GLUCAP,  in the last 168 hours  No results found for this or any previous visit (from the past 240 hour(s)).   Studies: No results found.  Scheduled Meds: . azithromycin  500 mg Oral Daily  . carvedilol  3.125 mg Oral BID WC  . cefUROXime  500 mg Oral BID WC  . cloNIDine  0.1 mg Oral BID  . famotidine  20 mg Oral QHS  . furosemide  40 mg Oral Daily  . heparin  5,000 Units Subcutaneous 3 times per day  . losartan  25 mg Oral Daily  . mometasone-formoterol  2 puff Inhalation BID  . nicotine  7 mg Transdermal Daily  . pantoprazole  40 mg Oral Daily  . [START ON 03/17/2013] predniSONE  30 mg Oral Q breakfast  . simvastatin  10 mg Oral QHS  . sodium chloride  3 mL Intravenous Q12H  . tiotropium  18 mcg Inhalation Daily  . zolpidem  5 mg Oral QHS   Continuous Infusions:   Principal Problem:   COPD exacerbation Active Problems:   HYPERTENSION   Tobacco abuse   Chronic diastolic heart failure   Generalized anxiety disorder   Obesity, unspecified    Time spent: 25 minutes    [place] [facility] Pager [phone]. If 7PM-7AM, please contact night-coverage at [email or web address], password [facility] 03/16/2013, 11:21 AM  LOS: 2 days

## 2013-03-16 NOTE — Evaluation (Signed)
Physical Therapy Evaluation Patient Details Name: [name] MRN: [id number] DOB: Apr 23, 1945 Today's Date: 03/16/2013 Time: 7062-3762 PT Time Calculation (min): 16 min  PT Assessment / Plan / Recommendation History of Present Illness  [name] is a 68 y.o. female who presents to the ED with worsening SOB.  Patient has extensive history of COPD (sees pulmonologist, in pulmonary rehab, numerous admits for COPD in past, still smokes a few cigs a day etc).  Her symptoms have been worsening for a couple of days, called pulmonologist 2 days ago and made appointment for 2/27.  Unfortunately, breathing was worse today with increase in non-productive cough.   Clinical Impression  Pt presents with SOB, decreased activity tolerance and decreased mobility.  Will benefit from skilled PT services to address deficits and increase functional independence.  Pt states she would like SNF at d/c to continue to get stronger.    PT Assessment  Patient needs continued PT services    Follow Up Recommendations  SNF    Does the patient have the potential to tolerate intense rehabilitation      Barriers to Discharge Inaccessible home environment 2 stairs to enter without rails    Equipment Recommendations  Other (comment) (rollator)    Recommendations for Other Services     Frequency Min 3X/week    Precautions / Restrictions Precautions Precaution Comments: desats with O2 Restrictions Weight Bearing Restrictions: No   Pertinent Vitals/Pain spO2 80%, HR 118bpm during gait on 3LO2, requires seated rest break for spO2 to return to 90%      Mobility  Bed Mobility Overal bed mobility: Modified Independent Transfers Overall transfer level: Needs assistance Equipment used: None Transfers: Sit to/from Stand Sit to Stand: Min assist General transfer comment: steadying assist during sit to stand Ambulation/Gait Ambulation/Gait assistance: Min assist Ambulation Distance (Feet): 35 Feet Assistive  device: None Gait velocity interpretation: Below normal speed for age/gender General Gait Details: pt requires frequent standing rest breaks, holds to wall for support.  spO2 drops to 80% during gait of 35' on 3LO2, requires seated rest break to return to 90%.  Pt unable to speak more than1 word at a time after gait, states she is normally this SOB after any activity.    Exercises     PT Diagnosis: Difficulty walking;Generalized weakness  PT Problem List: Decreased activity tolerance;Decreased strength;Decreased mobility;Cardiopulmonary status limiting activity PT Treatment Interventions: DME instruction;Therapeutic exercise;Wheelchair mobility training;Balance training;Gait training;Stair training;Neuromuscular re-education;Modalities;Functional mobility training;Therapeutic activities;Patient/family education     PT Goals(Current goals can be found in the care plan section) Acute Rehab PT Goals Patient Stated Goal: get my legs stronger PT Goal Formulation: With patient Time For Goal Achievement: 03/23/13 Potential to Achieve Goals: Good  Visit Information  Last PT Received On: 03/16/13 Assistance Needed: +1 History of Present Illness: [name] is a 68 y.o. female who presents to the ED with worsening SOB.  Patient has extensive history of COPD (sees pulmonologist, in pulmonary rehab, numerous admits for COPD in past, still smokes a few cigs a day etc).  Her symptoms have been worsening for a couple of days, called pulmonologist 2 days ago and made appointment for 2/27.  Unfortunately, breathing was worse today with increase in non-productive cough.        Prior [place] expects to be discharged to:: Private residence Living Arrangements: Children Available Help at Discharge: Family;Available PRN/intermittently Type of Home: Apartment Home Access: Stairs to enter Entrance Stairs-Number of Steps: 2 Entrance Stairs-Rails: None Home Layout: One  level Home Equipment: None Prior Function Level of Independence: Independent Comments: pt wears oxygen, able to get around the house without assistance, does not drive Communication Communication: No difficulties    Cognition  Cognition Arousal/Alertness: Awake/alert Behavior During Therapy: WFL for tasks assessed/performed Overall Cognitive Status: Within Functional Limits for tasks assessed    Extremity/Trunk Assessment Upper Extremity Assessment Upper Extremity Assessment: Generalized weakness Lower Extremity Assessment Lower Extremity Assessment: Generalized weakness Cervical / Trunk Assessment Cervical / Trunk Assessment: Normal   Balance    End of Session PT - End of Session Equipment Utilized During Treatment: Oxygen Activity Tolerance: Patient limited by fatigue Patient left: in chair;with call bell/phone within reach Nurse Communication: Mobility status  GP     [name] 03/16/2013, 10:38 AM

## 2013-03-16 NOTE — Progress Notes (Signed)
Came to visit patient to discuss and explain [facility] Care Management services. Confirms she is going to SNF. Consents signed. Gave her contact information to call once she is discharged from SNF. Made her aware that [place] can follow once she is at home. States she will call once she is at home from SNF. Left [facility] Care Management packet and contact information at bedside. Appreciative of visit. [name], MSN- [facility] Liaison475 [phone]

## 2013-03-16 NOTE — Progress Notes (Addendum)
Updated: CSW spoke to patient about bed offers. Patient would like to go to [facility] at [employer].  CSW provided bed offers to patient. Patient states she will talk to family and let social worker know her choice by this afternoon.   Jeanette Caprice, MSW, [place]

## 2013-03-16 NOTE — Progress Notes (Signed)
Received EPIC referral for [facility] services. Spoke with Dr Maryland Pink about patient going to SNF at discharge and [place] [facility] unable to follow at [facility]. However, will speak with patient at bedside and introduce [place] [facility] services and give her contact information to call once she gets home. Will follow up with patient. [name], MSN- RN, [facility] [phone]

## 2013-03-17 ENCOUNTER — Other Ambulatory Visit: Payer: Self-pay | Admitting: Adult Health

## 2013-03-17 DIAGNOSIS — R262 Difficulty in walking, not elsewhere classified: Secondary | ICD-10-CM | POA: Diagnosis not present

## 2013-03-17 DIAGNOSIS — M6281 Muscle weakness (generalized): Secondary | ICD-10-CM | POA: Diagnosis not present

## 2013-03-17 DIAGNOSIS — I5030 Unspecified diastolic (congestive) heart failure: Secondary | ICD-10-CM | POA: Diagnosis not present

## 2013-03-17 DIAGNOSIS — F411 Generalized anxiety disorder: Secondary | ICD-10-CM

## 2013-03-17 DIAGNOSIS — R1312 Dysphagia, oropharyngeal phase: Secondary | ICD-10-CM | POA: Diagnosis not present

## 2013-03-17 DIAGNOSIS — R059 Cough, unspecified: Secondary | ICD-10-CM | POA: Diagnosis not present

## 2013-03-17 DIAGNOSIS — J189 Pneumonia, unspecified organism: Secondary | ICD-10-CM | POA: Diagnosis not present

## 2013-03-17 DIAGNOSIS — F4322 Adjustment disorder with anxiety: Secondary | ICD-10-CM

## 2013-03-17 DIAGNOSIS — J9600 Acute respiratory failure, unspecified whether with hypoxia or hypercapnia: Secondary | ICD-10-CM | POA: Diagnosis not present

## 2013-03-17 DIAGNOSIS — R05 Cough: Secondary | ICD-10-CM | POA: Diagnosis not present

## 2013-03-17 DIAGNOSIS — R000 Tachycardia, unspecified: Secondary | ICD-10-CM | POA: Diagnosis not present

## 2013-03-17 DIAGNOSIS — E119 Type 2 diabetes mellitus without complications: Secondary | ICD-10-CM | POA: Diagnosis not present

## 2013-03-17 DIAGNOSIS — J449 Chronic obstructive pulmonary disease, unspecified: Secondary | ICD-10-CM | POA: Diagnosis not present

## 2013-03-17 DIAGNOSIS — I10 Essential (primary) hypertension: Secondary | ICD-10-CM | POA: Diagnosis not present

## 2013-03-17 DIAGNOSIS — J158 Pneumonia due to other specified bacteria: Secondary | ICD-10-CM | POA: Diagnosis not present

## 2013-03-17 DIAGNOSIS — E669 Obesity, unspecified: Secondary | ICD-10-CM | POA: Diagnosis not present

## 2013-03-17 DIAGNOSIS — M79609 Pain in unspecified limb: Secondary | ICD-10-CM | POA: Diagnosis not present

## 2013-03-17 DIAGNOSIS — J4489 Other specified chronic obstructive pulmonary disease: Secondary | ICD-10-CM | POA: Diagnosis not present

## 2013-03-17 DIAGNOSIS — J441 Chronic obstructive pulmonary disease with (acute) exacerbation: Secondary | ICD-10-CM | POA: Diagnosis not present

## 2013-03-17 DIAGNOSIS — I5032 Chronic diastolic (congestive) heart failure: Secondary | ICD-10-CM | POA: Diagnosis not present

## 2013-03-17 DIAGNOSIS — IMO0001 Reserved for inherently not codable concepts without codable children: Secondary | ICD-10-CM | POA: Diagnosis not present

## 2013-03-17 DIAGNOSIS — I509 Heart failure, unspecified: Secondary | ICD-10-CM | POA: Diagnosis not present

## 2013-03-17 MED ORDER — CEFUROXIME AXETIL 500 MG PO TABS: 500.0000 mg | ORAL_TABLET | Freq: Two times a day (BID) | ORAL | Status: DC

## 2013-03-17 MED ORDER — LEVOFLOXACIN 500 MG PO TABS: 500.0000 mg | ORAL_TABLET | Freq: Every day | ORAL | Status: DC

## 2013-03-17 MED ORDER — PREDNISONE 10 MG PO TABS: 30.0000 mg | ORAL_TABLET | Freq: Every day | ORAL | Status: DC

## 2013-03-17 MED ORDER — ALPRAZOLAM 0.5 MG PO TABS: 0.5000 mg | ORAL_TABLET | Freq: Three times a day (TID) | ORAL | Status: DC | PRN

## 2013-03-17 MED ORDER — CARVEDILOL 3.125 MG PO TABS: 3.1250 mg | ORAL_TABLET | Freq: Two times a day (BID) | ORAL | Status: DC

## 2013-03-17 NOTE — Progress Notes (Signed)
Patient has a confirmed bed at [facility]. Patient is in agreement to discharging today to [facility]. Once DC summary is complete, CSW will complete dc packet.  Jeanette Caprice, MSW, [place]

## 2013-03-17 NOTE — Clinical Social Work Note (Signed)
Clinical Social Worker facilitated patient discharge including contacting patient family and facility to confirm patient discharge plans.  Clinical information faxed to facility and family agreeable with plan.  CSW arranged transport via family to [facility] .  RN to call report prior to discharge.  Clinical Social Worker will sign off for now as social work intervention is no longer needed. Please consult us again if new need arises.  Jesse Davisha [place], LCSW [phone]  

## 2013-03-17 NOTE — Progress Notes (Signed)
Pt d/c'd to SNF, family transporting pt to facility. D/c instructions reviewed with pt, copy of instructions given to pt and copy placed in pt's packet for facility. Scripts also placed in packet for facility. Pt d/c'd via wheelchair with belongings, on O2 3L/[place] with pt'd own O2 tank, with family, escorted by unit NT.

## 2013-03-17 NOTE — Psychosocial Assessment (Signed)
[name] 68 y.o. female  180 day Psychosocial Note  Patient psychosocial assessment reveals  barriers to participation in Pulmonary Rehab. Psychosocial areas that are currently affecting patient's rehab experience include concerns about exacerbation of her COPD and pneumonia.  Patient is currently admitted to the hospital and awaiting skilled nursing facility placement.  Patient does not continue to exhibit positive coping skills to deal with her psychosocial concerns.  Patient continues to smoke cigarettes despite our effort th help her stop along with her physician.  Her attendance has been very unreliable due to transportation issues and her health.  She has only attended 14 exercise sessions since she started on 09/29/2012.  Offered emotional support and reassurance. Patient does not feel she is making progress toward Pulmonary Rehab goals. Patient reports her health and activity level has not improved in the past 30 days as evidenced by patient's report of not significantly changed ability to walk more than around her house inside.  Patient states family/friends have not noticed changes in her activity or mood. Patient reports not feeling positive about current and projected progression in Pulmonary Rehab.  After reviewing the patient's treatment plan, the patient is not making progress toward Pulmonary Rehab goals. Patient's rate of progress toward rehab goals is poor.  Since this last hospitalization I am not sure if patient will return to pulmonary rehab program, we will see how long she requires a skilled nursing facility.   Plan of action is to monitor patient's progress. Goal(s) in progress: Continue to monitor patient for progress to see if she should be discharged from the program or continue

## 2013-03-17 NOTE — Discharge Summary (Signed)
Physician Discharge Summary  [name] [id number] DOB: 08-May-1945 DOA: 03/14/2013  PCP: Webb Silversmith, NP  Admit date: 03/14/2013 Discharge date: 03/17/2013  Time spent: 45inutes  Recommendations for Outpatient Follow-up:  PCP in 1 week Fu CXR in 4-6weeks  Discharge Diagnoses:  Principal Problem:   COPD exacerbation Active Problems:   HYPERTENSION   Tobacco abuse   Chronic diastolic heart failure   Generalized anxiety disorder   Obesity, unspecified   CAP (community acquired pneumonia)   Tachycardia, unspecified   Discharge Condition: improving Diet recommendation: low sodium Filed Weights   03/14/13 0607 03/15/13 0452 03/16/13 0416  Weight: 101.742 kg (224 lb 4.8 oz) 102.241 kg (225 lb 6.4 oz) 101.7 kg (224 lb 3.3 oz)    History of present illness:  [name] is a 68 y.o. female who presents to the ED with worsening SOB. Patient has extensive history of COPD (sees pulmonologist, in pulmonary rehab, numerous admits for COPD in past, still smokes a few cigs a day etc). Her symptoms have been worsening for a couple of days, called pulmonologist 2 days ago and made appointment for 2/27. Unfortunately, breathing was worse today with increase in non-productive cough. No fevers nor chills. She tried to use her inhaler at home without much relief  Hospital Course:     The patient was treated with IV Solu-Medrol, antibiotics nebulizers and oxygen. A previous echo did confirm her history of diastolic dysfunction, however BNP was normal and she was continued on her home dose of Lasix. She is much improved, however still quite deconditioned, something that was going on even prior to this exacerbation. Plan is for her to go to skilled nursing facility for physical rehabilitation. COPD exacerbation: treated with IV solumedrol, antibiotics nebulizers and oxygen, clinically improving, Solu-Medrol changed to prednisone, continuing taper, continue antibiotics for community-acquired  pneumonia, continue PRN nebulizers/home inhalers and oxygen. Needs FU CXR in 4-6weeks  Community-acquired pneumonia: On antibiotics, changed to PO levaquin at discharge, needs FU CXR in 4-6weeks  Tachycardia: due to COPD exacerbation and neb treatments -changed when necessary albuterol to Xopenex. Dr.Krishnan also added very low dose Coreg which would also help her heart failure.  -stable and heart rate improved now  HYPERTENSION: Continue home meds. Added Coreg. See above.   Tobacco abuse: Counseled to quit. On nicotine patch   Chronic diastolic heart failure: Stable. BNP normal   Generalized anxiety disorder: Continue when necessary Xanax  Obesity, unspecified: Patient needs criteria with BMI of 38.       Discharge Exam: Filed Vitals:   03/17/13 1124  BP: 115/81  Pulse:   Temp:   Resp:     General: AAOx3 Cardiovascular:S1S2/RRR Respiratory: improved air movement  Discharge Instructions      Discharge Orders   Future Appointments Provider Department Dept Phone   03/23/2013 1:30 PM [facility] [place] [id number]   03/25/2013 1:30 PM [facility] [place] [phone]   03/30/2013 1:30 PM [facility] [place] [id number]   04/01/2013 1:30 PM [facility] [place] [phone]   04/06/2013 1:30 PM [facility] [place] [phone]   Future Orders Complete By Expires   Diet - low sodium heart healthy  As directed    Increase activity slowly  As directed        Medication List         albuterol 108 (90  BASE) MCG/ACT inhaler  Commonly known as:  PROVENTIL HFA;VENTOLIN HFA  Inhale 2 puffs into the lungs every 6 (six) hours as needed.     albuterol (2.5 MG/3ML) 0.083% nebulizer solution  Commonly known as:  PROVENTIL   Take 3 mLs (2.5 mg total) by nebulization every 6 (six) hours as needed for wheezing. Dx 496     ALPRAZolam 0.5 MG tablet  Commonly known as:  XANAX  Take 1 tablet (0.5 mg total) by mouth 3 (three) times daily as needed for anxiety.     carvedilol 3.125 MG tablet  Commonly known as:  COREG  Take 1 tablet (3.125 mg total) by mouth 2 (two) times daily with a meal.     cloNIDine 0.1 MG tablet  Commonly known as:  CATAPRES  Take 0.1 mg by mouth 2 (two) times daily.     DULERA 200-5 MCG/ACT Aero  Generic drug:  mometasone-formoterol  Inhale 2 puffs into the lungs 2 (two) times daily.     famotidine 20 MG tablet  Commonly known as:  PEPCID  Take 1 tablet (20 mg total) by mouth at bedtime.     fluticasone 50 MCG/ACT nasal spray  Commonly known as:  FLONASE  Place 2 sprays into both nostrils daily as needed for allergies or rhinitis.     furosemide 40 MG tablet  Commonly known as:  LASIX  Take 1 tablet (40 mg total) by mouth daily.     levofloxacin 500 MG tablet  Commonly known as:  LEVAQUIN  Take 1 tablet (500 mg total) by mouth daily. For 4 days     losartan 25 MG tablet  Commonly known as:  COZAAR  Take 1 tablet (25 mg total) by mouth daily.     omeprazole 20 MG capsule  Commonly known as:  PRILOSEC  Take 1 capsule (20 mg total) by mouth daily.     predniSONE 10 MG tablet  Commonly known as:  DELTASONE  Take 3 tablets (30 mg total) by mouth daily with breakfast. Take 30mg  for 2days then 20mg  for 2days then 10mg  for 2days then STOP     simvastatin 10 MG tablet  Commonly known as:  ZOCOR  Take 1 tablet (10 mg total) by mouth at bedtime.     tiotropium 18 MCG inhalation capsule  Commonly known as:  SPIRIVA  Place 18 mcg into inhaler and inhale daily.       No Known Allergies Follow-up Information   Follow up with Webb Silversmith, NP. Schedule an appointment as soon as possible for a visit in 1 week.   Specialty:  Internal Medicine   Contact information:   [address] [place] [address] [phone]        The results of significant diagnostics from this hospitalization (including imaging, microbiology, ancillary and laboratory) are listed below for reference.    Significant Diagnostic Studies: Dg Chest Portable 1 View  03/14/2013   CLINICAL DATA:  Shortness of breath.  History of smoking.  EXAM: PORTABLE CHEST - 1 VIEW  COMPARISON:  Chest radiograph and CTA of the chest performed 01/11/2013  FINDINGS: The lungs are well-aerated. Bibasilar airspace opacification may reflect pulmonary edema or multifocal pneumonia. Underlying vascular congestion is seen. A small left pleural effusion is seen. No pneumothorax is identified.  The cardiomediastinal silhouette is mildly enlarged. No acute osseous abnormalities are seen.  IMPRESSION: Vascular congestion and mild cardiomegaly noted. Bibasilar airspace opacification may reflect pulmonary edema or multifocal pneumonia. Small left pleural  effusion seen.   Electronically Signed   By: Garald Balding M.D.   On: 03/14/2013 03:27    Microbiology: No results found for this or any previous visit (from the past 240 hour(s)).   Labs: Basic Metabolic Panel:  Recent Labs Lab 03/14/13 0304  NA 138  K 4.2  CL 101  CO2 24  GLUCOSE 125*  BUN 7  CREATININE 1.09  CALCIUM 9.3   Liver Function Tests: No results found for this basename: AST, ALT, ALKPHOS, BILITOT, PROT, ALBUMIN,  in the last 168 hours No results found for this basename: LIPASE, AMYLASE,  in the last 168 hours No results found for this basename: AMMONIA,  in the last 168 hours CBC:  Recent Labs Lab 03/14/13 0304  WBC 6.9  HGB 11.3*  HCT 38.4  MCV 77.1*  PLT 340   Cardiac Enzymes: No results found for this basename: CKTOTAL, CKMB, CKMBINDEX, TROPONINI,  in the last 168 hours BNP: BNP (last 3 results)  Recent Labs  07/16/12 1728 03/14/13 0305  PROBNP 25.0 29.8   CBG: No results found for this basename: GLUCAP,  in the last  168 hours     Signed:  Remedy Corporan  [facility] 03/17/2013, 12:10 PM

## 2013-03-18 ENCOUNTER — Encounter (HOSPITAL_COMMUNITY)
Admission: RE | Admit: 2013-03-18 | Discharge: 2013-03-18 | Disposition: A | Discharge status: Home / Self Care | Payer: Medicare Other | Source: Ambulatory Visit | Attending: Internal Medicine | Admitting: Internal Medicine

## 2013-03-18 DIAGNOSIS — J449 Chronic obstructive pulmonary disease, unspecified: Secondary | ICD-10-CM | POA: Diagnosis not present

## 2013-03-18 DIAGNOSIS — E669 Obesity, unspecified: Secondary | ICD-10-CM | POA: Diagnosis not present

## 2013-03-18 DIAGNOSIS — F411 Generalized anxiety disorder: Secondary | ICD-10-CM | POA: Diagnosis not present

## 2013-03-18 DIAGNOSIS — I10 Essential (primary) hypertension: Secondary | ICD-10-CM | POA: Diagnosis not present

## 2013-03-19 ENCOUNTER — Ambulatory Visit: Payer: Medicare Other | Admitting: Internal Medicine

## 2013-03-19 NOTE — Telephone Encounter (Signed)
Spoke with the pt and notified of recs per MR She verbalized understanding Unsure why the rxs were not called in- see PN 03/12/13??

## 2013-03-19 NOTE — Telephone Encounter (Signed)
Inpatient pulm rehab. But we weill set it up at  Followup  Also why did [facility] not get our prescription?

## 2013-03-22 ENCOUNTER — Telehealth (HOSPITAL_COMMUNITY): Payer: Self-pay | Admitting: *Deleted

## 2013-03-22 NOTE — Psychosocial Assessment (Signed)
[name] 68 y.o. female  Final Psychosocial Note  Patient psychosocial assessment reveals barriers to participation in Pulmonary Rehab. Patient was admitted to the hospital in the last week for an exacerbation of her COPD and pneumonia.  She is presently at a skilled nursing facility for an undetermined amount of time.  She is being discharged from the pulmonary rehab program at this point.    Rosebud Poles RN Pulmonary Rehab

## 2013-03-22 NOTE — Telephone Encounter (Signed)
Issue with Rx has been addressed, nothing further needed at this time. [place] Bing, CMA

## 2013-03-22 NOTE — Psychosocial Assessment (Signed)
Staff MD, Rehab Director Note/Attestation  Reviewed pshyosocial assessment. Agree with goal  Laid out by the staff of rehab  Dr. Brand Males, M.D., [facility].C.P Pulmonary and Critical Care Medicine Staff Physician and Director of [facility] Pager: [phone], If no answer or between  15:00h - 7:00h: call [phone]  03/22/2013 1:53 PM

## 2013-03-23 ENCOUNTER — Encounter (HOSPITAL_COMMUNITY): Payer: Medicare Other

## 2013-03-23 ENCOUNTER — Inpatient Hospital Stay (HOSPITAL_COMMUNITY)
Admission: RE | Admit: 2013-03-23 | Discharge: 2013-03-23 | Disposition: A | Discharge status: Home / Self Care | Payer: Medicare Other | Source: Ambulatory Visit

## 2013-03-24 DIAGNOSIS — E119 Type 2 diabetes mellitus without complications: Secondary | ICD-10-CM | POA: Diagnosis not present

## 2013-03-24 DIAGNOSIS — J449 Chronic obstructive pulmonary disease, unspecified: Secondary | ICD-10-CM | POA: Diagnosis not present

## 2013-03-25 ENCOUNTER — Encounter (HOSPITAL_COMMUNITY): Payer: Medicare Other

## 2013-03-29 DIAGNOSIS — I509 Heart failure, unspecified: Secondary | ICD-10-CM | POA: Diagnosis not present

## 2013-03-29 DIAGNOSIS — R05 Cough: Secondary | ICD-10-CM | POA: Diagnosis not present

## 2013-03-29 DIAGNOSIS — R059 Cough, unspecified: Secondary | ICD-10-CM | POA: Diagnosis not present

## 2013-03-29 DIAGNOSIS — M79609 Pain in unspecified limb: Secondary | ICD-10-CM | POA: Diagnosis not present

## 2013-03-29 DIAGNOSIS — J449 Chronic obstructive pulmonary disease, unspecified: Secondary | ICD-10-CM | POA: Diagnosis not present

## 2013-03-30 ENCOUNTER — Encounter (HOSPITAL_COMMUNITY): Payer: Medicare Other

## 2013-04-01 ENCOUNTER — Encounter (HOSPITAL_COMMUNITY): Payer: Medicare Other

## 2013-04-01 DIAGNOSIS — J189 Pneumonia, unspecified organism: Secondary | ICD-10-CM | POA: Diagnosis not present

## 2013-04-01 DIAGNOSIS — J9600 Acute respiratory failure, unspecified whether with hypoxia or hypercapnia: Secondary | ICD-10-CM | POA: Diagnosis not present

## 2013-04-02 ENCOUNTER — Telehealth: Payer: Self-pay | Admitting: Internal Medicine

## 2013-04-02 DIAGNOSIS — J189 Pneumonia, unspecified organism: Secondary | ICD-10-CM

## 2013-04-02 NOTE — Telephone Encounter (Signed)
I called and spoke with pt. She reports she will be released from rehab on 04/07/13. She was told she needed to f/u with MR shortly after for HFU. She only wants to see MR. Please advise thanks

## 2013-04-03 ENCOUNTER — Other Ambulatory Visit: Payer: Self-pay | Admitting: Adult Health

## 2013-04-03 ENCOUNTER — Other Ambulatory Visit: Payer: Self-pay | Admitting: Internal Medicine

## 2013-04-03 NOTE — Telephone Encounter (Signed)
Give her first avail fu < 3wekks

## 2013-04-05 DIAGNOSIS — I5030 Unspecified diastolic (congestive) heart failure: Secondary | ICD-10-CM | POA: Diagnosis not present

## 2013-04-05 DIAGNOSIS — J449 Chronic obstructive pulmonary disease, unspecified: Secondary | ICD-10-CM | POA: Diagnosis not present

## 2013-04-05 DIAGNOSIS — E119 Type 2 diabetes mellitus without complications: Secondary | ICD-10-CM | POA: Diagnosis not present

## 2013-04-05 DIAGNOSIS — I10 Essential (primary) hypertension: Secondary | ICD-10-CM | POA: Diagnosis not present

## 2013-04-05 NOTE — Telephone Encounter (Signed)
LMOMTCB x 1 - Need to schedule f/u with Dr [name]

## 2013-04-06 ENCOUNTER — Encounter (HOSPITAL_COMMUNITY): Payer: Medicare Other

## 2013-04-06 NOTE — Telephone Encounter (Signed)
Called spoke with patient, HFU scheduled with MR on 3.27.15 @ 4:15pm.  Pt aware she will need cxr prior to visit and will arrive early for this.  Cxr ordered STAT for that date.  Nothing further needed; will sign off.  Per discharge: Recommendations for Outpatient Follow-up:  PCP in 1 week  Fu CXR in 4-6weeks  Discharge Diagnoses:  Principal Problem:  COPD exacerbation  Active Problems:  HYPERTENSION  Tobacco abuse  Chronic diastolic heart failure  Generalized anxiety disorder  Obesity, unspecified  CAP (community acquired pneumonia)  Tachycardia, unspecified

## 2013-04-08 DIAGNOSIS — E669 Obesity, unspecified: Secondary | ICD-10-CM | POA: Diagnosis not present

## 2013-04-08 DIAGNOSIS — Z8701 Personal history of pneumonia (recurrent): Secondary | ICD-10-CM | POA: Diagnosis not present

## 2013-04-08 DIAGNOSIS — Z9981 Dependence on supplemental oxygen: Secondary | ICD-10-CM | POA: Diagnosis not present

## 2013-04-08 DIAGNOSIS — F411 Generalized anxiety disorder: Secondary | ICD-10-CM | POA: Diagnosis not present

## 2013-04-08 DIAGNOSIS — I5032 Chronic diastolic (congestive) heart failure: Secondary | ICD-10-CM

## 2013-04-08 DIAGNOSIS — I10 Essential (primary) hypertension: Secondary | ICD-10-CM

## 2013-04-08 DIAGNOSIS — J441 Chronic obstructive pulmonary disease with (acute) exacerbation: Secondary | ICD-10-CM

## 2013-04-09 DIAGNOSIS — F411 Generalized anxiety disorder: Secondary | ICD-10-CM | POA: Diagnosis not present

## 2013-04-09 DIAGNOSIS — I10 Essential (primary) hypertension: Secondary | ICD-10-CM | POA: Diagnosis not present

## 2013-04-09 DIAGNOSIS — J441 Chronic obstructive pulmonary disease with (acute) exacerbation: Secondary | ICD-10-CM | POA: Diagnosis not present

## 2013-04-09 DIAGNOSIS — I5032 Chronic diastolic (congestive) heart failure: Secondary | ICD-10-CM | POA: Diagnosis not present

## 2013-04-09 DIAGNOSIS — Z9981 Dependence on supplemental oxygen: Secondary | ICD-10-CM | POA: Diagnosis not present

## 2013-04-09 DIAGNOSIS — E669 Obesity, unspecified: Secondary | ICD-10-CM | POA: Diagnosis not present

## 2013-04-12 DIAGNOSIS — Z9981 Dependence on supplemental oxygen: Secondary | ICD-10-CM | POA: Diagnosis not present

## 2013-04-12 DIAGNOSIS — I5032 Chronic diastolic (congestive) heart failure: Secondary | ICD-10-CM | POA: Diagnosis not present

## 2013-04-12 DIAGNOSIS — F411 Generalized anxiety disorder: Secondary | ICD-10-CM | POA: Diagnosis not present

## 2013-04-12 DIAGNOSIS — E669 Obesity, unspecified: Secondary | ICD-10-CM | POA: Diagnosis not present

## 2013-04-12 DIAGNOSIS — I10 Essential (primary) hypertension: Secondary | ICD-10-CM | POA: Diagnosis not present

## 2013-04-12 DIAGNOSIS — J441 Chronic obstructive pulmonary disease with (acute) exacerbation: Secondary | ICD-10-CM | POA: Diagnosis not present

## 2013-04-15 DIAGNOSIS — Z9981 Dependence on supplemental oxygen: Secondary | ICD-10-CM | POA: Diagnosis not present

## 2013-04-15 DIAGNOSIS — E669 Obesity, unspecified: Secondary | ICD-10-CM | POA: Diagnosis not present

## 2013-04-15 DIAGNOSIS — I10 Essential (primary) hypertension: Secondary | ICD-10-CM | POA: Diagnosis not present

## 2013-04-15 DIAGNOSIS — F411 Generalized anxiety disorder: Secondary | ICD-10-CM | POA: Diagnosis not present

## 2013-04-15 DIAGNOSIS — J441 Chronic obstructive pulmonary disease with (acute) exacerbation: Secondary | ICD-10-CM | POA: Diagnosis not present

## 2013-04-15 DIAGNOSIS — I5032 Chronic diastolic (congestive) heart failure: Secondary | ICD-10-CM | POA: Diagnosis not present

## 2013-04-16 ENCOUNTER — Inpatient Hospital Stay: Payer: Medicare Other | Admitting: Internal Medicine

## 2013-04-16 DIAGNOSIS — I10 Essential (primary) hypertension: Secondary | ICD-10-CM | POA: Diagnosis not present

## 2013-04-16 DIAGNOSIS — E669 Obesity, unspecified: Secondary | ICD-10-CM | POA: Diagnosis not present

## 2013-04-16 DIAGNOSIS — F411 Generalized anxiety disorder: Secondary | ICD-10-CM | POA: Diagnosis not present

## 2013-04-16 DIAGNOSIS — Z9981 Dependence on supplemental oxygen: Secondary | ICD-10-CM | POA: Diagnosis not present

## 2013-04-16 DIAGNOSIS — I5032 Chronic diastolic (congestive) heart failure: Secondary | ICD-10-CM | POA: Diagnosis not present

## 2013-04-16 DIAGNOSIS — J441 Chronic obstructive pulmonary disease with (acute) exacerbation: Secondary | ICD-10-CM | POA: Diagnosis not present

## 2013-04-20 DIAGNOSIS — Z9981 Dependence on supplemental oxygen: Secondary | ICD-10-CM | POA: Diagnosis not present

## 2013-04-20 DIAGNOSIS — I5032 Chronic diastolic (congestive) heart failure: Secondary | ICD-10-CM | POA: Diagnosis not present

## 2013-04-20 DIAGNOSIS — I10 Essential (primary) hypertension: Secondary | ICD-10-CM | POA: Diagnosis not present

## 2013-04-20 DIAGNOSIS — J441 Chronic obstructive pulmonary disease with (acute) exacerbation: Secondary | ICD-10-CM | POA: Diagnosis not present

## 2013-04-20 DIAGNOSIS — F411 Generalized anxiety disorder: Secondary | ICD-10-CM | POA: Diagnosis not present

## 2013-04-20 DIAGNOSIS — E669 Obesity, unspecified: Secondary | ICD-10-CM | POA: Diagnosis not present

## 2013-04-22 DIAGNOSIS — E669 Obesity, unspecified: Secondary | ICD-10-CM | POA: Diagnosis not present

## 2013-04-22 DIAGNOSIS — J441 Chronic obstructive pulmonary disease with (acute) exacerbation: Secondary | ICD-10-CM | POA: Diagnosis not present

## 2013-04-22 DIAGNOSIS — I10 Essential (primary) hypertension: Secondary | ICD-10-CM | POA: Diagnosis not present

## 2013-04-22 DIAGNOSIS — Z9981 Dependence on supplemental oxygen: Secondary | ICD-10-CM | POA: Diagnosis not present

## 2013-04-22 DIAGNOSIS — F411 Generalized anxiety disorder: Secondary | ICD-10-CM | POA: Diagnosis not present

## 2013-04-22 DIAGNOSIS — I5032 Chronic diastolic (congestive) heart failure: Secondary | ICD-10-CM | POA: Diagnosis not present

## 2013-04-23 DIAGNOSIS — F411 Generalized anxiety disorder: Secondary | ICD-10-CM | POA: Diagnosis not present

## 2013-04-23 DIAGNOSIS — I10 Essential (primary) hypertension: Secondary | ICD-10-CM | POA: Diagnosis not present

## 2013-04-23 DIAGNOSIS — J441 Chronic obstructive pulmonary disease with (acute) exacerbation: Secondary | ICD-10-CM | POA: Diagnosis not present

## 2013-04-23 DIAGNOSIS — I5032 Chronic diastolic (congestive) heart failure: Secondary | ICD-10-CM | POA: Diagnosis not present

## 2013-04-23 DIAGNOSIS — Z9981 Dependence on supplemental oxygen: Secondary | ICD-10-CM | POA: Diagnosis not present

## 2013-04-23 DIAGNOSIS — E669 Obesity, unspecified: Secondary | ICD-10-CM | POA: Diagnosis not present

## 2013-04-26 DIAGNOSIS — F411 Generalized anxiety disorder: Secondary | ICD-10-CM | POA: Diagnosis not present

## 2013-04-26 DIAGNOSIS — Z9981 Dependence on supplemental oxygen: Secondary | ICD-10-CM | POA: Diagnosis not present

## 2013-04-26 DIAGNOSIS — I10 Essential (primary) hypertension: Secondary | ICD-10-CM | POA: Diagnosis not present

## 2013-04-26 DIAGNOSIS — I5032 Chronic diastolic (congestive) heart failure: Secondary | ICD-10-CM | POA: Diagnosis not present

## 2013-04-26 DIAGNOSIS — J441 Chronic obstructive pulmonary disease with (acute) exacerbation: Secondary | ICD-10-CM | POA: Diagnosis not present

## 2013-04-26 DIAGNOSIS — E669 Obesity, unspecified: Secondary | ICD-10-CM | POA: Diagnosis not present

## 2013-04-29 DIAGNOSIS — I5032 Chronic diastolic (congestive) heart failure: Secondary | ICD-10-CM | POA: Diagnosis not present

## 2013-04-29 DIAGNOSIS — I10 Essential (primary) hypertension: Secondary | ICD-10-CM | POA: Diagnosis not present

## 2013-04-29 DIAGNOSIS — J441 Chronic obstructive pulmonary disease with (acute) exacerbation: Secondary | ICD-10-CM | POA: Diagnosis not present

## 2013-04-29 DIAGNOSIS — E669 Obesity, unspecified: Secondary | ICD-10-CM | POA: Diagnosis not present

## 2013-04-29 DIAGNOSIS — Z9981 Dependence on supplemental oxygen: Secondary | ICD-10-CM | POA: Diagnosis not present

## 2013-04-29 DIAGNOSIS — F411 Generalized anxiety disorder: Secondary | ICD-10-CM | POA: Diagnosis not present

## 2013-04-30 DIAGNOSIS — E669 Obesity, unspecified: Secondary | ICD-10-CM | POA: Diagnosis not present

## 2013-04-30 DIAGNOSIS — F411 Generalized anxiety disorder: Secondary | ICD-10-CM | POA: Diagnosis not present

## 2013-04-30 DIAGNOSIS — I10 Essential (primary) hypertension: Secondary | ICD-10-CM | POA: Diagnosis not present

## 2013-04-30 DIAGNOSIS — Z9981 Dependence on supplemental oxygen: Secondary | ICD-10-CM | POA: Diagnosis not present

## 2013-04-30 DIAGNOSIS — J441 Chronic obstructive pulmonary disease with (acute) exacerbation: Secondary | ICD-10-CM | POA: Diagnosis not present

## 2013-04-30 DIAGNOSIS — I5032 Chronic diastolic (congestive) heart failure: Secondary | ICD-10-CM | POA: Diagnosis not present

## 2013-05-04 DIAGNOSIS — I5032 Chronic diastolic (congestive) heart failure: Secondary | ICD-10-CM | POA: Diagnosis not present

## 2013-05-04 DIAGNOSIS — E669 Obesity, unspecified: Secondary | ICD-10-CM | POA: Diagnosis not present

## 2013-05-04 DIAGNOSIS — F411 Generalized anxiety disorder: Secondary | ICD-10-CM | POA: Diagnosis not present

## 2013-05-04 DIAGNOSIS — I10 Essential (primary) hypertension: Secondary | ICD-10-CM | POA: Diagnosis not present

## 2013-05-04 DIAGNOSIS — Z9981 Dependence on supplemental oxygen: Secondary | ICD-10-CM | POA: Diagnosis not present

## 2013-05-04 DIAGNOSIS — J441 Chronic obstructive pulmonary disease with (acute) exacerbation: Secondary | ICD-10-CM | POA: Diagnosis not present

## 2013-05-05 DIAGNOSIS — Z9981 Dependence on supplemental oxygen: Secondary | ICD-10-CM | POA: Diagnosis not present

## 2013-05-05 DIAGNOSIS — E669 Obesity, unspecified: Secondary | ICD-10-CM | POA: Diagnosis not present

## 2013-05-05 DIAGNOSIS — F411 Generalized anxiety disorder: Secondary | ICD-10-CM | POA: Diagnosis not present

## 2013-05-05 DIAGNOSIS — I5032 Chronic diastolic (congestive) heart failure: Secondary | ICD-10-CM | POA: Diagnosis not present

## 2013-05-05 DIAGNOSIS — I10 Essential (primary) hypertension: Secondary | ICD-10-CM | POA: Diagnosis not present

## 2013-05-05 DIAGNOSIS — J441 Chronic obstructive pulmonary disease with (acute) exacerbation: Secondary | ICD-10-CM | POA: Diagnosis not present

## 2013-05-06 DIAGNOSIS — Z9981 Dependence on supplemental oxygen: Secondary | ICD-10-CM | POA: Diagnosis not present

## 2013-05-06 DIAGNOSIS — I10 Essential (primary) hypertension: Secondary | ICD-10-CM | POA: Diagnosis not present

## 2013-05-06 DIAGNOSIS — E669 Obesity, unspecified: Secondary | ICD-10-CM | POA: Diagnosis not present

## 2013-05-06 DIAGNOSIS — F411 Generalized anxiety disorder: Secondary | ICD-10-CM | POA: Diagnosis not present

## 2013-05-06 DIAGNOSIS — I5032 Chronic diastolic (congestive) heart failure: Secondary | ICD-10-CM | POA: Diagnosis not present

## 2013-05-06 DIAGNOSIS — J441 Chronic obstructive pulmonary disease with (acute) exacerbation: Secondary | ICD-10-CM | POA: Diagnosis not present

## 2013-05-07 DIAGNOSIS — J441 Chronic obstructive pulmonary disease with (acute) exacerbation: Secondary | ICD-10-CM | POA: Diagnosis not present

## 2013-05-07 DIAGNOSIS — I10 Essential (primary) hypertension: Secondary | ICD-10-CM | POA: Diagnosis not present

## 2013-05-07 DIAGNOSIS — E669 Obesity, unspecified: Secondary | ICD-10-CM | POA: Diagnosis not present

## 2013-05-07 DIAGNOSIS — I5032 Chronic diastolic (congestive) heart failure: Secondary | ICD-10-CM | POA: Diagnosis not present

## 2013-05-07 DIAGNOSIS — F411 Generalized anxiety disorder: Secondary | ICD-10-CM | POA: Diagnosis not present

## 2013-05-07 DIAGNOSIS — Z9981 Dependence on supplemental oxygen: Secondary | ICD-10-CM | POA: Diagnosis not present

## 2013-05-10 ENCOUNTER — Ambulatory Visit: Payer: Medicare Other | Admitting: Physician Assistant

## 2013-05-11 DIAGNOSIS — I10 Essential (primary) hypertension: Secondary | ICD-10-CM | POA: Diagnosis not present

## 2013-05-11 DIAGNOSIS — E669 Obesity, unspecified: Secondary | ICD-10-CM | POA: Diagnosis not present

## 2013-05-11 DIAGNOSIS — I5032 Chronic diastolic (congestive) heart failure: Secondary | ICD-10-CM | POA: Diagnosis not present

## 2013-05-11 DIAGNOSIS — F411 Generalized anxiety disorder: Secondary | ICD-10-CM | POA: Diagnosis not present

## 2013-05-11 DIAGNOSIS — Z9981 Dependence on supplemental oxygen: Secondary | ICD-10-CM | POA: Diagnosis not present

## 2013-05-11 DIAGNOSIS — J441 Chronic obstructive pulmonary disease with (acute) exacerbation: Secondary | ICD-10-CM | POA: Diagnosis not present

## 2013-05-12 DIAGNOSIS — Z9981 Dependence on supplemental oxygen: Secondary | ICD-10-CM | POA: Diagnosis not present

## 2013-05-12 DIAGNOSIS — J441 Chronic obstructive pulmonary disease with (acute) exacerbation: Secondary | ICD-10-CM | POA: Diagnosis not present

## 2013-05-12 DIAGNOSIS — E669 Obesity, unspecified: Secondary | ICD-10-CM | POA: Diagnosis not present

## 2013-05-12 DIAGNOSIS — I10 Essential (primary) hypertension: Secondary | ICD-10-CM | POA: Diagnosis not present

## 2013-05-12 DIAGNOSIS — I5032 Chronic diastolic (congestive) heart failure: Secondary | ICD-10-CM | POA: Diagnosis not present

## 2013-05-12 DIAGNOSIS — F411 Generalized anxiety disorder: Secondary | ICD-10-CM | POA: Diagnosis not present

## 2013-05-14 DIAGNOSIS — E669 Obesity, unspecified: Secondary | ICD-10-CM | POA: Diagnosis not present

## 2013-05-14 DIAGNOSIS — I10 Essential (primary) hypertension: Secondary | ICD-10-CM | POA: Diagnosis not present

## 2013-05-14 DIAGNOSIS — I5032 Chronic diastolic (congestive) heart failure: Secondary | ICD-10-CM | POA: Diagnosis not present

## 2013-05-14 DIAGNOSIS — Z9981 Dependence on supplemental oxygen: Secondary | ICD-10-CM | POA: Diagnosis not present

## 2013-05-14 DIAGNOSIS — F411 Generalized anxiety disorder: Secondary | ICD-10-CM | POA: Diagnosis not present

## 2013-05-14 DIAGNOSIS — J441 Chronic obstructive pulmonary disease with (acute) exacerbation: Secondary | ICD-10-CM | POA: Diagnosis not present

## 2013-05-17 ENCOUNTER — Encounter: Payer: Self-pay | Admitting: Internal Medicine

## 2013-05-17 ENCOUNTER — Ambulatory Visit (INDEPENDENT_AMBULATORY_CARE_PROVIDER_SITE_OTHER): Payer: Medicare Other | Admitting: Internal Medicine

## 2013-05-17 VITALS — BP 132/70 | HR 113 | Temp 97.7°F | Ht 64.0 in | Wt 224.0 lb

## 2013-05-17 DIAGNOSIS — F411 Generalized anxiety disorder: Secondary | ICD-10-CM

## 2013-05-17 DIAGNOSIS — Z1239 Encounter for other screening for malignant neoplasm of breast: Secondary | ICD-10-CM

## 2013-05-17 DIAGNOSIS — Z1211 Encounter for screening for malignant neoplasm of colon: Secondary | ICD-10-CM

## 2013-05-17 DIAGNOSIS — J449 Chronic obstructive pulmonary disease, unspecified: Secondary | ICD-10-CM | POA: Diagnosis not present

## 2013-05-17 DIAGNOSIS — Z23 Encounter for immunization: Secondary | ICD-10-CM

## 2013-05-17 DIAGNOSIS — R911 Solitary pulmonary nodule: Secondary | ICD-10-CM

## 2013-05-17 DIAGNOSIS — E785 Hyperlipidemia, unspecified: Secondary | ICD-10-CM

## 2013-05-17 DIAGNOSIS — I10 Essential (primary) hypertension: Secondary | ICD-10-CM | POA: Diagnosis not present

## 2013-05-17 MED ORDER — OMEPRAZOLE 20 MG PO CPDR: DELAYED_RELEASE_CAPSULE | ORAL | Status: DC

## 2013-05-17 MED ORDER — ALPRAZOLAM 0.5 MG PO TABS: 0.5000 mg | ORAL_TABLET | Freq: Three times a day (TID) | ORAL | Status: DC | PRN

## 2013-05-17 MED ORDER — LOSARTAN POTASSIUM 25 MG PO TABS: 25.0000 mg | ORAL_TABLET | Freq: Every day | ORAL | Status: DC

## 2013-05-17 MED ORDER — FAMOTIDINE 20 MG PO TABS: ORAL_TABLET | ORAL | Status: DC

## 2013-05-17 MED ORDER — CLONIDINE HCL 0.1 MG PO TABS: 0.1000 mg | ORAL_TABLET | Freq: Two times a day (BID) | ORAL | Status: DC

## 2013-05-17 MED ORDER — FUROSEMIDE 40 MG PO TABS: 40.0000 mg | ORAL_TABLET | Freq: Every day | ORAL | Status: DC

## 2013-05-17 MED ORDER — CARVEDILOL 3.125 MG PO TABS: 3.1250 mg | ORAL_TABLET | Freq: Two times a day (BID) | ORAL | Status: DC

## 2013-05-17 NOTE — Assessment & Plan Note (Signed)
Reviewed plans for followup CT chest summer 2015 to monitor same Patient advised on importance of smoking cessation

## 2013-05-17 NOTE — Assessment & Plan Note (Signed)
BP Readings from Last 3 Encounters:  05/17/13 132/70  03/17/13 115/81  02/16/13 122/74   Variable med compliance reviewed - refills provided today

## 2013-05-17 NOTE — Assessment & Plan Note (Signed)
On statin check annually and titrate as needed 

## 2013-05-17 NOTE — Assessment & Plan Note (Signed)
Recent increase in alpraz 02/2013 because of panic related to dyspnea/air hunger symptoms improved - will continue same Refill today

## 2013-05-17 NOTE — Assessment & Plan Note (Signed)
GOLD III, O2 dep Frequent exacerbations, complicated by noncompliance and high cost of medications Frequent hospitalizations for same reviewed, last February 2015 Follows with pulmonary for same, currently ongoing pulmonary rehabilitation Importance of compliance reviewed, no changes recommended 

## 2013-05-17 NOTE — Progress Notes (Signed)
Pre visit review using our clinic review tool, if applicable. No additional management support is needed unless otherwise documented below in the visit note. 

## 2013-05-17 NOTE — Progress Notes (Signed)
Subjective:    Patient ID: [name], female    DOB: Feb 09, 1945, 68 y.o.   MRN: [id number]  HPI  Patient is here for follow up - transfer from NP [facility] to me Reviewed chronic medical issues and interval medical events  Past Medical History  Diagnosis Date  . Hypertension   . COPD (chronic obstructive pulmonary disease)      FEV-1 in 2008 was 63% with a diffusion capacity of 33%.   . Tobacco abuse      Smokes one pack a day since age 40.  Marland Kitchen History of cervical cancer 1982  . Pulmonary nodule march 2012    53mm RUL and RLL 1st seen march 2012 CT, ?progression 12/2012 CT  . 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 Bronchiectasis march 2012    On CT chest. RUL. Mild  . Medical non-compliance   . Chicken pox   . Seasonal allergies   . Arrhythmia   . Hyperlipidemia   . Tobacco abuse   . Chronic diastolic heart failure   . Chronic respiratory failure     Followed in Pulmonary clinic/ [facility]/ Ramaswamy  - 06/30/2012 desat to 86%  RA walking 50 ft, recovered to 90% at rest - 06/30/2012  Walked 1lpm x 3 laps @ 185 ft each stopped due to  Sob, no desat  rec 02 2lpm with activity and sleeping, ok at rest   . Generalized anxiety disorder   . Leg swelling     Venous doppler right 07/21/12 >>Neg    . MITRAL VALVE PROLAPSE   . Obesity, unspecified     Review of Systems  Constitutional: Positive for fatigue. Negative for fever and unexpected weight change.  Respiratory: Positive for shortness of breath (chronic, at baseline). Negative for chest tightness and wheezing.   Cardiovascular: Negative for chest pain, palpitations and leg swelling.       Objective:   Physical Exam  BP 132/70  Pulse 113  Temp(Src) 97.7 F (36.5 C) (Oral)  Ht 5\' 4"  (1.626 m)  Wt 224 lb (101.606 kg)  BMI 38.43 kg/m2  SpO2 89% Wt Readings from Last 3 Encounters:  05/17/13 224 lb (101.606 kg)  03/16/13 224 lb 3.3 oz (101.7 kg)  02/16/13 229 lb (103.874  kg)   Constitutional: She is overweight, but appears well-developed and well-nourished. No distress. heavy cigarette smell Neck: Normal range of motion. Neck supple. No JVD present. No thyromegaly present.  Cardiovascular: Normal rate, regular rhythm and distant heart sounds.  No murmur heard. No BLE edema. Pulmonary/Chest: slightly increased effort at rest and breath sounds diminshed. No respiratory distress. She has no wheezes.  Psychiatric: She has a mildly anxious mood and affect. Her behavior is normal. Judgment and thought content normal.   Lab Results  Component Value Date   WBC 6.9 03/14/2013   HGB 11.3* 03/14/2013   HCT 38.4 03/14/2013   PLT 340 03/14/2013   GLUCOSE 125* 03/14/2013   CHOL 187 08/26/2012   TRIG 109.0 08/26/2012   HDL 33.50* 08/26/2012   LDLCALC 132* 08/26/2012   ALT 19 08/26/2012   AST 19 08/26/2012   NA 138 03/14/2013   K 4.2 03/14/2013   CL 101 03/14/2013   CREATININE 1.09 03/14/2013   BUN 7 03/14/2013   CO2 24 03/14/2013   TSH 2.80 08/26/2012    No results found.     Assessment & Plan:   Problem List Items Addressed  This Visit   COPD (chronic obstructive pulmonary disease)     GOLD III, O2 dep Frequent exacerbations, complicated by noncompliance and high cost of medications Frequent hospitalizations for same reviewed, last February 2015 Follows with pulmonary for same, currently ongoing pulmonary rehabilitation Importance of compliance reviewed, no changes recommended    Generalized anxiety disorder (Chronic)     Recent increase in alpraz 02/2013 because of panic related to dyspnea/air hunger symptoms improved - will continue same Refill today    HYPERLIPIDEMIA     On statin check annually and titrate as needed    Relevant Medications      carvedilol (COREG) tablet      cloNIDine (CATAPRES) tablet      furosemide (LASIX) tablet      losartan (COZAAR) tablet   HYPERTENSION - Primary (Chronic)      BP Readings from Last 3 Encounters:  05/17/13 132/70    03/17/13 115/81  02/16/13 122/74   Variable med compliance reviewed - refills provided today    Relevant Medications      carvedilol (COREG) tablet      cloNIDine (CATAPRES) tablet      furosemide (LASIX) tablet      losartan (COZAAR) tablet   Pulmonary nodule     Reviewed plans for followup CT chest summer 2015 to monitor same Patient advised on importance of smoking cessation     Other Visit Diagnoses   Special screening for malignant neoplasms, colon        Other screening breast examination

## 2013-05-17 NOTE — Patient Instructions (Signed)
It was good to see you today.  We have reviewed your prior records including labs and tests today  Prevnar (new pneumonia vaccine) updated today  Medications reviewed and updated, no changes recommended at this time. Refill on medication(s) as discussed today.  we'll make referral for mammogram and for screening colonoscopy . Our office will contact you regarding appointment(s) once made.  Please schedule followup in 6 months, call sooner if problems.  Continue to think about giving up cigarettes! Use nicotine gum, nicotine patches or electronic cigarettes. If you're interested in medication to help you quit, please call  - let me know how I can help!

## 2013-05-18 ENCOUNTER — Telehealth: Payer: Self-pay | Admitting: Internal Medicine

## 2013-05-18 DIAGNOSIS — I5032 Chronic diastolic (congestive) heart failure: Secondary | ICD-10-CM | POA: Diagnosis not present

## 2013-05-18 DIAGNOSIS — I10 Essential (primary) hypertension: Secondary | ICD-10-CM | POA: Diagnosis not present

## 2013-05-18 DIAGNOSIS — J441 Chronic obstructive pulmonary disease with (acute) exacerbation: Secondary | ICD-10-CM | POA: Diagnosis not present

## 2013-05-18 DIAGNOSIS — Z9981 Dependence on supplemental oxygen: Secondary | ICD-10-CM | POA: Diagnosis not present

## 2013-05-18 DIAGNOSIS — E669 Obesity, unspecified: Secondary | ICD-10-CM | POA: Diagnosis not present

## 2013-05-18 DIAGNOSIS — F411 Generalized anxiety disorder: Secondary | ICD-10-CM | POA: Diagnosis not present

## 2013-05-18 NOTE — Telephone Encounter (Signed)
Relevant patient education assigned to patient using Emmi. ° °

## 2013-05-21 DIAGNOSIS — F411 Generalized anxiety disorder: Secondary | ICD-10-CM | POA: Diagnosis not present

## 2013-05-21 DIAGNOSIS — J441 Chronic obstructive pulmonary disease with (acute) exacerbation: Secondary | ICD-10-CM | POA: Diagnosis not present

## 2013-05-21 DIAGNOSIS — E669 Obesity, unspecified: Secondary | ICD-10-CM | POA: Diagnosis not present

## 2013-05-21 DIAGNOSIS — I5032 Chronic diastolic (congestive) heart failure: Secondary | ICD-10-CM | POA: Diagnosis not present

## 2013-05-21 DIAGNOSIS — Z9981 Dependence on supplemental oxygen: Secondary | ICD-10-CM | POA: Diagnosis not present

## 2013-05-21 DIAGNOSIS — I10 Essential (primary) hypertension: Secondary | ICD-10-CM | POA: Diagnosis not present

## 2013-05-24 ENCOUNTER — Encounter: Payer: Self-pay | Admitting: Internal Medicine

## 2013-05-24 ENCOUNTER — Ambulatory Visit (INDEPENDENT_AMBULATORY_CARE_PROVIDER_SITE_OTHER)
Admission: RE | Admit: 2013-05-24 | Discharge: 2013-05-24 | Disposition: A | Discharge status: Home / Self Care | Payer: Medicare Other | Source: Ambulatory Visit | Attending: Internal Medicine | Admitting: Internal Medicine

## 2013-05-24 ENCOUNTER — Ambulatory Visit (INDEPENDENT_AMBULATORY_CARE_PROVIDER_SITE_OTHER): Payer: Medicare Other | Admitting: Internal Medicine

## 2013-05-24 VITALS — BP 130/82 | HR 100 | Ht 65.0 in | Wt 226.6 lb

## 2013-05-24 DIAGNOSIS — E559 Vitamin D deficiency, unspecified: Secondary | ICD-10-CM

## 2013-05-24 DIAGNOSIS — J189 Pneumonia, unspecified organism: Secondary | ICD-10-CM

## 2013-05-24 DIAGNOSIS — R911 Solitary pulmonary nodule: Secondary | ICD-10-CM

## 2013-05-24 DIAGNOSIS — J449 Chronic obstructive pulmonary disease, unspecified: Secondary | ICD-10-CM

## 2013-05-24 DIAGNOSIS — J438 Other emphysema: Secondary | ICD-10-CM | POA: Diagnosis not present

## 2013-05-24 MED ORDER — MOMETASONE FURO-FORMOTEROL FUM 200-5 MCG/ACT IN AERO: 2.0000 | INHALATION_SPRAY | Freq: Two times a day (BID) | RESPIRATORY_TRACT | Status: DC

## 2013-05-24 MED ORDER — ALBUTEROL SULFATE HFA 108 (90 BASE) MCG/ACT IN AERS: 2.0000 | INHALATION_SPRAY | Freq: Four times a day (QID) | RESPIRATORY_TRACT | Status: DC | PRN

## 2013-05-24 MED ORDER — TIOTROPIUM BROMIDE MONOHYDRATE 18 MCG IN CAPS: 18.0000 ug | ORAL_CAPSULE | Freq: Every day | RESPIRATORY_TRACT | Status: DC

## 2013-05-24 NOTE — Progress Notes (Signed)
Subjective:    Patient ID: [name], female    DOB: 02-05-1945, 68 y.o.   MRN: [id number]  HPI      Chief Complaint  Patient presents with  . Hosptial Follow up    D/C on 03/14/13- Increased sob with exertion.  Reports not sleeping well over past two and needs refill today on her medications    OV April 2012: Obese female hospitalized in March 2012 and now following. 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 OVerall doing much better since discharge. Using o2. No insurance. Unable to afford xopenex and pulmicort neb. Able to afford atroven neb. Wanting med change. Dysponea is at baseline class 3. Reportedly cut down on smoking to 1 cig/day. Mild pedal edema improving. Admits to snoring and excess day time somnolence   FEV-1 in 2008 was 63% with a diffusion capacity of 33%   #COPD  - due to cost stop taking pulmicort neb and xopenex neb  - your new regimen is atrovent neb 4 times daily., QVAR inhaler 2 puff bid, albuterol inhaler 2 puff as needed (take samples and show technique)  - use oxygen all 24 hours (we will Hold off on portable system due to cost issues)  # sleep apnea  - you probably have this  - have sleep study and fu with Dr. Elsworth Soho or Dr Halford Chessman  #Smoking  - please quit  #CT lung nodule and node  - we will do CT chest in 6 months  #Followup  - 2 months or sooner if needed  - if new meds are costly please call us soon  - please see $ counsillor soon   OV 11/12/10: Followup a) COPD; b) smoking; c) lung nodule; and d) possible sleep apnea. COPD is stable. Using mdi and nebs. Still smoking.  Wants to do e-cigs and is asking infor about it. Had sleep study in May 2012: AHI 1.8, PLM 27 but denies restless legs. $ issues and did not fu with Dr  Halford Chessman. Says she is finding it tough to pay bills for sleep apnea. Has not had CT chest as fu for nodule; cost issue again. Does not qualify for medicaid. Medicare few years away. Does not have private health care. Goes to [employer]  Past, Family and Social: no change    #sleep study  - you just need to use oxygen with sleep  - you do not need cpap machine  #Smoking  - please quit smoking  - at this point research does not recommend e-cigarette  #COPD  - you use your nebulizers as scheduled  #lung nodule  - you need followup CT chest in next 3-6 months (with contrast)  - meet with my manager to find out cost esp for [facility] patients  #Followup  - 6 months  - flu shot today   OV 06/22/2012  Followup a) COPD; b) smoking; c) lung nodule;  Have not seen her since October 2012. She only now got her Medicare in April 2015 and therefore made this appointment. Overall she says she was stable but now for the past 3 weeks is having increased cough, wheezing, chest tightness, increased white color phlegm. She is not using her medications correctly. She is finding nebulizers to much of a hassle to use. She does use her Qvar but  I'm not sure of her compliance. COPD cat score is 35.  In terms of smoking: She continues to smoke conventional tobacco. The office smells of tobacco. She's not sure she will be interested in quitting  In terms of lung nodule: It has been over 2 years since she had her last CT scan of the chest. She never wanted to have a CT scan of the chest because she had to pay for it out of pocket. But now that she has Medicare she is willing to have the CT scan of the chest  rec #COPD flareup - You are having a COPD flare up right now -  take levaquin 500mg  once daily  X 5 days - Please Please take prednisone 40 mg x1 day, then 30 mg x1 day, then 20 mg x1 day, then 10 mg x1 day, and then 5 mg x1 day and stop  #COPD - Continue Qvar 80 mcg 2 puff 2 times a day - Try ANORO 1  puff once daily  - this medication is a trial only   - learn technique from my CMA  #Lung nodule  = have followup CT chest - 2 year followup  #Smoking  - please quit  #Followup  - full PFT in 5 weeks - return after ct chest in 5 weeks   06/30/2012 f/u ov/Wert still smoking Chief Complaint  Patient presents with  . Acute Visit    Pt states breathing is no better since visit here on 06/22/12. Still having alot of SOB and prod cough and mucus gets hung in her throat.   not using 02 daytime x after gets tired and sits down, has only tried the neb x one and it did help. Mucus is clear >>rx Dulera, stopped QVAR and Anoro    CT chest >previously seen right lung nodules are no longer  demonstrated. The third nodule is stable. Progressive mediastinal adenopathy.  Changes of COPD and chronic bronchitis  07/16/2012 Acute OV  Complains of increased SOB, and worsening LE edema x2 days Still smoking.  Wearing O2 2 l/m with act and At bedtime   Takes O2 off to smoke. Advised on smoking cessation and dangers of smoking Started on Dulera last ov , Anoro and QVAR were stopped. .  Was not able to start PPI and pepcid , wants an rx.  Wants something for anxiety. Ran out xanax , can not afford to go back to psychiatry.  No chest pain, syncope, fever, cough or n/v.    REC Continue on Dulera 2 puffs Twice daily   Begin Prilosec 20mg  daily before meal  Begin Pepcid 20mg  At bedtime   Xanax 0.5mg  Twice daily  As needed  Anxiety - will need to get refills thru family MD  NO SMOKING- at all especially in home where oxygen tanks/machine is present.  Keep legs elevated.  Take Lasix 20mg  2 tabs daily for 3 days then 1 tab daily  We are setting you up for an ultrasound of your right leg .  Please contact office for sooner follow up if symptoms do not improve or worsen or seek emergency care  follow up Dr. [name] in 2 weeks and As neede  OV 07/28/2012  FU  1) Lung nodule : had 2 year fu CT - see  below. I personally reviewed this scan  Ct chest 06/25/12 IMPRESSION:  1. The previously seen right lung nodules are no longer  demonstrated. The third nodule is stable. This could be further  evaluated with a repeat chest CT without contrast in 9-12 months.  2. Progressive mediastinal adenopathy. - largest to me looks50mm 3. Changes of COPD and chronic bronchitis.  4. Linear scarring at the left lung base.  5. Diffuse hepatic steatosis.  Original Report Authenticated By: Claudie Revering, M.D   2) COPD - stable diseaes. COPD Cat score is 23 and reflects significant symptoms. Says rehab still has not called her. She is interested. PFTs 78/14 shows gold stage 3 copd with  fev1 1.12L/49%, FVC 2.01L/70% - post bd parameters.  No bd response, Ratio 60/77%. TLC 87%, DLCO 5/21%   3) new RLE pedal edema:   Duplex LE - 07/21/12 - negative. BNP 07/16/12 - 25 and bmet normal. Apparently Dr Melvyn Novas has given her lasix which is helping. She is frustrated she does not know etiology. She has no PCP   Oct 2014 hosp in [place] with chf ? Related to hypoxemia > 3lpm but weaned it to 2lpm   02/16/2013 f/u ov/Wert re: sob/ referred by rehab where she arrived per her usual routine Chief Complaint  Patient presents with  . Acute Visit    Pt c/o increased SOB with any exertion, chest congestion, nonprod cough.  much better on prednisone one week prior to OV  And last dose was 02/12/13 and worse again since. Confused with when/ how to use her various inhalers/ nebs  No obvious day to day or daytime variabilty or assoc   cp or chest tightness, subjective wheeze overt sinus or hb symptoms. No unusual exp hx or h/o childhood pna/ asthma or knowledge of premature birth.  Sleeping ok without nocturnal  or early am exacerbation  of respiratory  c/o's or need for noct saba. Also denies any obvious fluctuation of symptoms with weather or environmental changes or other aggravating or alleviating factors except as outlined  above    OV  05/24/2013  Chief Complaint  Patient presents with  . Hosptial Follow up    D/C on 03/14/13- Increased sob with exertion.  Reports not sleeping well over past two and needs refill today on her medications   Recent hospitalization in for  for COPD exacerbation followed by  inpatient rehabilitation. She feels he did not get adequate rehabilitation at the rehabilitation facility. She is getting home physical therapy. Nevertheless she says has lot of fatigue since discharge but  denies chest pain or orthopnea or edema. She continues to use oxygen. She is frustrated by the level of fatigue. She does acknowledge that pulmonary rehabilitation helped her in the past. She's really keen on going the etiology of her fatigue and getting to the bottom of it. She wants portable o2 system  Last echocardiogram March 2008 was normal with an ejection fraction of 65%  TSH August 2014 was normal  Last CT scan of the chest December 2014: No vomiting was in but had small pulmonary nodules which require followup in 2015 December  BNP February 2015: Is normal   ABG February  2015 did show some mild hypercapnia with a PCO2 of 49  Blood work February 2015 shows hemoglobin 11 g percent and a normal creatinine of 1.09 mg percent with a GFR of 60   VIt D - she denies having had it but lab order gives hard stop saying she has had this tested several times  Dg Chest 2 View  05/24/2013   CLINICAL DATA:  Follow-up pneumonia  EXAM: CHEST  2 VIEW  COMPARISON:  03/14/2013, 01/11/2013, and 08/26/2012  FINDINGS:  Chronic interstitial markings/emphysematous changes. Superimposed scarring/fibrosis in the lateral right upper lobe and lingula. No focal consolidation. No pleural effusion or pneumothorax.  Mild cardiomegaly.  Degenerative changes of the visualized thoracolumbar spine.  IMPRESSION: No evidence of acute cardiopulmonary disease.  Emphysematous changes with superimposed scarring/fibrosis, as above.    Electronically Signed   By: Julian Hy M.D.   On: 05/24/2013 14:39     Review of Systems  Constitutional: Negative for fever and unexpected weight change.  HENT: Positive for postnasal drip and sinus pressure. Negative for congestion, dental problem, ear pain, nosebleeds, rhinorrhea, sneezing, sore throat and trouble swallowing.   Eyes: Negative for redness and itching.  Respiratory: Positive for shortness of breath. Negative for cough, chest tightness and wheezing.   Cardiovascular: Positive for leg swelling. Negative for palpitations.  Gastrointestinal: Negative for nausea and vomiting.  Genitourinary: Negative for dysuria.  Musculoskeletal: Negative for joint swelling.  Skin: Negative for rash.  Neurological: Negative for headaches.  Hematological: Does not bruise/bleed easily.  Psychiatric/Behavioral: Positive for dysphoric mood. The patient is nervous/anxious.        Objective:   Physical Exam  Vitals reviewed. Constitutional: She is oriented to person, place, and time. She appears well-developed and well-nourished. No distress.  Body mass index is 37.71 kg/(m^2).   HENT:  Head: Normocephalic and atraumatic.  Right Ear: External ear normal.  Left Ear: External ear normal.  Mouth/Throat: Oropharynx is clear and moist. No oropharyngeal exudate.  Eyes: Conjunctivae and EOM are normal. Pupils are equal, round, and reactive to light. Right eye exhibits no discharge. Left eye exhibits no discharge. No scleral icterus.  Neck: Normal range of motion. Neck supple. No JVD present. No tracheal deviation present. No thyromegaly present.  Cardiovascular: Normal rate, regular rhythm, normal heart sounds and intact distal pulses.  Exam reveals no gallop and no friction rub.   No murmur heard. Pulmonary/Chest: Effort normal and breath sounds normal. No respiratory distress. She has no wheezes. She has no rales. She exhibits no tenderness.  o2 on  Chronic purse lip breathing Overall  diminished air entry  Abdominal: Soft. Bowel sounds are normal. She exhibits no distension and no mass. There is no tenderness. There is no rebound and no guarding.  Musculoskeletal: Normal range of motion. She exhibits no edema and no tenderness.  Lymphadenopathy:    She has no cervical adenopathy.  Neurological: She is alert and oriented to person, place, and time. She has normal reflexes. No cranial nerve deficit. She exhibits normal muscle tone. Coordination normal.  Skin: Skin is warm and dry. No rash noted. She is not diaphoretic. No erythema. No pallor.  Psychiatric: She has a normal mood and affect. Her behavior is normal. Judgment and thought content normal.          Assessment & Plan:  #Copd and Chronic Respiratory Failure  - currently no evidence of flare up or decompensation and you seem recovered from recent flare up  - Fatigue: unclear why there is so much fatigue, could be related to recent hospitalization - check Vitamin D level today; will call with results - CMA will ensure refills on mdi, please continue them - [facility] will help get you portable o2 system  - refer opd pulmonary rehabilitation for emphysema  #Pulmonary nodule  - at followup will ensure CT scan for dec 2015 followup of nodules   #Followup   51months or sooner if needed

## 2013-05-24 NOTE — Patient Instructions (Addendum)
#  Copd and Chronic Respiratory Failure  - currently no evidence of flare up or decompensation and you seem recovered from recent flare up  - Fatigue: unclear why there is so much fatigue, could be related to recent hospitalization - check Vitamin D level today; will call with results - CMA will ensure refills on mdi, please continue them - [facility] will help get you portable o2 system  - refer opd pulmonary rehabilitation for emphysema  #Pulmonary nodule  - at followup will ensure CT scan for dec 2015 followup of nodules   #Followup   70months or sooner if needed

## 2013-05-25 ENCOUNTER — Telehealth: Payer: Self-pay | Admitting: Internal Medicine

## 2013-05-25 DIAGNOSIS — J441 Chronic obstructive pulmonary disease with (acute) exacerbation: Secondary | ICD-10-CM | POA: Diagnosis not present

## 2013-05-25 DIAGNOSIS — I10 Essential (primary) hypertension: Secondary | ICD-10-CM | POA: Diagnosis not present

## 2013-05-25 DIAGNOSIS — Z9981 Dependence on supplemental oxygen: Secondary | ICD-10-CM | POA: Diagnosis not present

## 2013-05-25 DIAGNOSIS — I5032 Chronic diastolic (congestive) heart failure: Secondary | ICD-10-CM | POA: Diagnosis not present

## 2013-05-25 DIAGNOSIS — F411 Generalized anxiety disorder: Secondary | ICD-10-CM | POA: Diagnosis not present

## 2013-05-25 DIAGNOSIS — E669 Obesity, unspecified: Secondary | ICD-10-CM | POA: Diagnosis not present

## 2013-05-25 DIAGNOSIS — J449 Chronic obstructive pulmonary disease, unspecified: Secondary | ICD-10-CM

## 2013-05-25 NOTE — Telephone Encounter (Signed)
LMTCB

## 2013-05-26 NOTE — Telephone Encounter (Signed)
Pt calling requesting a sleep aid--explained to that patient that she will need to contact her PCP to have this medication rxd as our physicians do not normally prescribe those types of medications.  Pt states also that her pharmacy informed her that she will no longer be able to receive Ventolin under her insurance, [employer] is covered. Pt states that Proair does not work as well and would like to stay with Ventolin.  Would like for [place] to try and contact pharmacy or insurance comp to determine cov'g  ---------- Called Medicare [phone] for [place] cov'g Pt ID # [id number]  Estimated wait time 64mins>>I have been on the phone for 10 mins holding with the pharmacy already. Will try back later.

## 2013-05-27 NOTE — Telephone Encounter (Signed)
Melissa returning call.Stanley A Dalton °

## 2013-05-27 NOTE — Telephone Encounter (Signed)
Melissa call back. She reports RT went to pt home today. When arrived someone at pt home told them pt was in the hospital and was told need to r/s to another day for RT to come out.  She is not showing that anyone mentioned her having an oxymizer.  Called pt. She reports the lady that came to her home was named Malachy Mood B (could not read last name) --RCP. Reports she filled out report. She tried pt on pulse dose O2 since cont O2 would not last pt very long. She reports her O2 dropped down into 70's.   I called Melissa and made her aware of the above. She will speak w/ Malachy Mood and have her call us.

## 2013-05-27 NOTE — Telephone Encounter (Signed)
Spoke with [name] and advised what pt had stated when she called before about o2 sats dropping and needing oxymyzer  [name] states will have to check with the RT and call us back

## 2013-05-27 NOTE — Telephone Encounter (Signed)
Called pt and she had no clue to as what I was talking about regarding her part D plan or phone number. She reports she was calling bc [facility] came out to give her smaller O2 canister.  Her O2 level was dropping and was told she needed an oxymizer?  Called [place] and LMTCB to see what is needed   I called [facility] and advised them of the below. They gave me phone # [phone] for part D plan I called and spoke with PA dept. Was advised this would need to go into clinical review. Will receive a notice in 24-72 hrs for approval/denial. Ref # [id number] Will forward to JC to look out for approval/denial for venotlin

## 2013-05-27 NOTE — Telephone Encounter (Signed)
lmomtcb x1 for melissa 

## 2013-05-27 NOTE — Telephone Encounter (Signed)
Called Medicare at the number provided  After waiting 13 min, was advised that this is not the number I need to call  Rep advised to call the pt and ask what her part d rx drug plan is and obtain the correct number from pt  I called pt and LMTCB

## 2013-05-28 DIAGNOSIS — E669 Obesity, unspecified: Secondary | ICD-10-CM | POA: Diagnosis not present

## 2013-05-28 DIAGNOSIS — J441 Chronic obstructive pulmonary disease with (acute) exacerbation: Secondary | ICD-10-CM | POA: Diagnosis not present

## 2013-05-28 DIAGNOSIS — Z9981 Dependence on supplemental oxygen: Secondary | ICD-10-CM | POA: Diagnosis not present

## 2013-05-28 DIAGNOSIS — I5032 Chronic diastolic (congestive) heart failure: Secondary | ICD-10-CM | POA: Diagnosis not present

## 2013-05-28 DIAGNOSIS — I10 Essential (primary) hypertension: Secondary | ICD-10-CM | POA: Diagnosis not present

## 2013-05-28 DIAGNOSIS — F411 Generalized anxiety disorder: Secondary | ICD-10-CM | POA: Diagnosis not present

## 2013-05-28 NOTE — Telephone Encounter (Signed)
That is fine  Dr. Brand Males, M.D., [facility].C.P Pulmonary and Critical Care Medicine Staff Physician [place] [facility] Pager: [phone], If no answer or between  15:00h - 7:00h: call [phone]  05/28/2013 11:50 AM

## 2013-05-28 NOTE — Telephone Encounter (Signed)
[facility] calling asking for 1 week extension for 2 visits for PT.  [phone]

## 2013-05-28 NOTE — Telephone Encounter (Signed)
[name] and spoke with [name] Per [name], pt had informed her that MR okayed for pt to start an outpatient rehab program.  [name] would like an order to extend pt's PT for 1 more week to equal 2 visits while pt is waiting to begin that program.  Dr [name] please advise of okay to send order to extend PT for 1 more @ 2 visits.  Thank you.  Per 5.4.15 ov: Patient Instructions     #Copd and Chronic Respiratory Failure  - currently no evidence of flare up or decompensation  - Fatigue: unclear why there is so much fatigue  - check Vitamin D level today; will call with results  - CMA will ensure refills on mdi, please continue them  - [facility] will help get you portable o2 system  - refer opd pulmonary rehabilitation for emphysema  #Pulmonary nodule  - at followup will ensure CT scan for dec 2015 followup of nodules  #Followup  22months or sooner if needed

## 2013-05-28 NOTE — Telephone Encounter (Signed)
Ok to send oxymizer  Dr. Brand Males, M.D., [facility].C.P Pulmonary and Critical Care Medicine Staff Physician [place] [facility] Pager: [phone], If no answer or between  15:00h - 7:00h: call [phone]  05/28/2013 4:54 PM

## 2013-05-28 NOTE — Telephone Encounter (Signed)
Order to [facility] for oxymizer as requested.  Order also placed for pulmonary rehab for emphysema.

## 2013-05-28 NOTE — Telephone Encounter (Signed)
[name] with [facility] returned call with update on the portable O2 eval According to Derry Lory RT did go to pt's home to qualify her for a portable concentrator > pt did not qualify d/t sats dropping into the 70s on pulsed dose O2.  Pt was given a large portable tank that does support continuous flow.  Pt is requesting an oxymizer - MR may we send order?  Thanks.

## 2013-05-28 NOTE — Telephone Encounter (Signed)
Called spoke with [name], informed her that MR okayed for the PT extension.  [name] verbalized her understanding. MR, sorry for not requesting clarification earlier, but was our office supposed to place an order for the outpt pulmonary rehab thru [facility] program?  This was not done at the ov.Marland KitchenMarland Kitchen

## 2013-06-01 DIAGNOSIS — E669 Obesity, unspecified: Secondary | ICD-10-CM | POA: Diagnosis not present

## 2013-06-01 DIAGNOSIS — F411 Generalized anxiety disorder: Secondary | ICD-10-CM | POA: Diagnosis not present

## 2013-06-01 DIAGNOSIS — I5032 Chronic diastolic (congestive) heart failure: Secondary | ICD-10-CM | POA: Diagnosis not present

## 2013-06-01 DIAGNOSIS — I10 Essential (primary) hypertension: Secondary | ICD-10-CM | POA: Diagnosis not present

## 2013-06-01 DIAGNOSIS — J441 Chronic obstructive pulmonary disease with (acute) exacerbation: Secondary | ICD-10-CM | POA: Diagnosis not present

## 2013-06-01 DIAGNOSIS — Z9981 Dependence on supplemental oxygen: Secondary | ICD-10-CM | POA: Diagnosis not present

## 2013-06-02 DIAGNOSIS — F411 Generalized anxiety disorder: Secondary | ICD-10-CM | POA: Diagnosis not present

## 2013-06-02 DIAGNOSIS — I10 Essential (primary) hypertension: Secondary | ICD-10-CM | POA: Diagnosis not present

## 2013-06-02 DIAGNOSIS — Z9981 Dependence on supplemental oxygen: Secondary | ICD-10-CM | POA: Diagnosis not present

## 2013-06-02 DIAGNOSIS — I5032 Chronic diastolic (congestive) heart failure: Secondary | ICD-10-CM | POA: Diagnosis not present

## 2013-06-02 DIAGNOSIS — J441 Chronic obstructive pulmonary disease with (acute) exacerbation: Secondary | ICD-10-CM | POA: Diagnosis not present

## 2013-06-02 DIAGNOSIS — E669 Obesity, unspecified: Secondary | ICD-10-CM | POA: Diagnosis not present

## 2013-06-09 ENCOUNTER — Telehealth: Payer: Self-pay | Admitting: Internal Medicine

## 2013-06-09 NOTE — Telephone Encounter (Signed)
Patient states that she has been having a difficult time sleeping and wants to know if she can be prescribed a sleep aid. She says she was told by Dr. Golden Pop office to contact Dr. Dutch Quint about this. She mentions that she is unable to take Ambien. Please advise.

## 2013-06-09 NOTE — Telephone Encounter (Signed)
Again must be evaluated by Dr. Asa Lente. Can hold to md return back in the office to get her response...[name]

## 2013-06-09 NOTE — Telephone Encounter (Signed)
Pt need to make a appt to be evaluated. MD will not prescribe anything without seeing her...[name]

## 2013-06-09 NOTE — Telephone Encounter (Signed)
Called pt to explain need for OV. She states that she does not have the money to come in for OV right now. Wants to know if OV notes from 05/24/13 with Dr. [name] can be reviewed. She saw him for this issue and was told that she needed to ask her PCP for sleep aid. Please advise.

## 2013-06-14 NOTE — Assessment & Plan Note (Signed)
 #  Pulmonary nodule  - at followup will ensure CT scan for dec 2015 followup of nodules   #Followup   18months or sooner if needed

## 2013-06-14 NOTE — Assessment & Plan Note (Signed)
#  Copd and Chronic Respiratory Failure  - currently no evidence of flare up or decompensation and you seem recovered from recent flare up  - Fatigue: unclear why there is so much fatigue, could be related to recent hospitalization - check Vitamin D level today; will call with results - CMA will ensure refills on mdi, please continue them - [facility] will help get you portable o2 system  - refer opd pulmonary rehabilitation for emphysema

## 2013-06-15 NOTE — Telephone Encounter (Signed)
i have reviewed OV 5/4 with Dr Alfonso Patten - says nothing of sleep aide from PCP I recommended OTC meds like melatonin, simply sleep or tylenol pm No other rx changes as we recently increased alpraz from bid to tid

## 2013-06-17 ENCOUNTER — Encounter: Payer: Self-pay | Admitting: Internal Medicine

## 2013-06-21 ENCOUNTER — Telehealth: Payer: Self-pay | Admitting: Internal Medicine

## 2013-06-21 MED ORDER — PREDNISONE 10 MG PO TABS: ORAL_TABLET | ORAL | Status: DC

## 2013-06-21 MED ORDER — DOXYCYCLINE HYCLATE 100 MG PO TABS: 100.0000 mg | ORAL_TABLET | Freq: Two times a day (BID) | ORAL | Status: DC

## 2013-06-21 NOTE — Telephone Encounter (Signed)
She is having a viral or summer flare and related to smoking is only coincidental  You have mild attack of copd called COPD exacerbation Please take doxycycline 100mg  twice daily after meals x 5 days; avoid sunlight Please take Take prednisone 40 mg daily x 2 days, then 20mg  daily x 2 days, then 10mg  daily x 2 days, then 5mg  daily x 2 days and stop

## 2013-06-21 NOTE — Telephone Encounter (Signed)
Called spoke w/ pt. Reports she stopped smoking yesterday. C/o full of congestion in chest today, prod cough light yellow phlem, wheezing, increase SOB d/t congestion per pt. All started since she stopped smoking yesterday per pt. Wants something called in. Please advise MR thanks  [facility] [address]  No Known Allergies

## 2013-06-21 NOTE — Telephone Encounter (Signed)
Pt calling again never received a call bk.[name]

## 2013-06-21 NOTE — Telephone Encounter (Signed)
Pt aware of recs per MR Doxy 100mg  and Pred taper have been called into [facility]  Nothing further needed.

## 2013-06-22 ENCOUNTER — Telehealth: Payer: Self-pay | Admitting: *Deleted

## 2013-06-22 NOTE — Telephone Encounter (Signed)
Will send to MR as an [place]

## 2013-06-22 NOTE — Telephone Encounter (Signed)
Message copied by Inge Rise on Tue Jun 22, 2013 10:54 AM ------      Message from: Darlina Guys      Created: Tue Jun 22, 2013  9:18 AM       Good morning.  Please see notes from our RT that saw this pt on 5/29 for oxymizer order:            "02 sats on 4.5lpm con't at rest with oxymizer 90-92% .  Patient was interested in getting the smaller ml6 tanks for portable with pulse dose.  I explained to her that she can not use the pulse dose with the oxyimzer.  I explained to her that she would still need to use the E tanks for portable."              Thanks and have a great day!!      Melissa             ------

## 2013-06-25 ENCOUNTER — Other Ambulatory Visit: Payer: Self-pay | Admitting: Internal Medicine

## 2013-06-29 ENCOUNTER — Ambulatory Visit: Payer: Medicare Other | Admitting: Adult Health

## 2013-07-06 ENCOUNTER — Telehealth: Payer: Self-pay

## 2013-07-06 NOTE — Telephone Encounter (Signed)
Pt transferred to team lead to schedule appt

## 2013-07-06 NOTE — Telephone Encounter (Signed)
Pt called stating that she hasn't taken her lasix in a few days, due to the fact that she already has a lack of energy and it sends her to the bathroom constantly.  Now she is experiencing rt leg and ankle swelling, SOB and weakness, and cough.  I told the pt that taking a fluid pill typically makes you go to the restroom to remove the fluid from the body.   I also informed the pt to schedule an appt to see her pcp, and she declined stating that "it is too hot outside, and the heat takes away my energy." Pt wants to know if there is a medication to take that can alleviate her sx? Please advise

## 2013-07-06 NOTE — Telephone Encounter (Signed)
OV advised in this situation thanks

## 2013-07-07 ENCOUNTER — Emergency Department (HOSPITAL_COMMUNITY): Payer: Medicare Other

## 2013-07-07 ENCOUNTER — Emergency Department (HOSPITAL_COMMUNITY)
Admission: EM | Admit: 2013-07-07 | Discharge: 2013-07-07 | Disposition: A | Discharge status: Home / Self Care | Payer: Medicare Other | Attending: Emergency Medicine | Admitting: Emergency Medicine

## 2013-07-07 ENCOUNTER — Encounter (HOSPITAL_COMMUNITY): Payer: Self-pay | Admitting: Emergency Medicine

## 2013-07-07 DIAGNOSIS — J9610 Chronic respiratory failure, unspecified whether with hypoxia or hypercapnia: Secondary | ICD-10-CM | POA: Insufficient documentation

## 2013-07-07 DIAGNOSIS — Z91199 Patient's noncompliance with other medical treatment and regimen due to unspecified reason: Secondary | ICD-10-CM | POA: Diagnosis not present

## 2013-07-07 DIAGNOSIS — R609 Edema, unspecified: Secondary | ICD-10-CM | POA: Diagnosis not present

## 2013-07-07 DIAGNOSIS — I059 Rheumatic mitral valve disease, unspecified: Secondary | ICD-10-CM | POA: Insufficient documentation

## 2013-07-07 DIAGNOSIS — M7989 Other specified soft tissue disorders: Secondary | ICD-10-CM | POA: Diagnosis not present

## 2013-07-07 DIAGNOSIS — I5032 Chronic diastolic (congestive) heart failure: Secondary | ICD-10-CM | POA: Insufficient documentation

## 2013-07-07 DIAGNOSIS — R222 Localized swelling, mass and lump, trunk: Secondary | ICD-10-CM | POA: Insufficient documentation

## 2013-07-07 DIAGNOSIS — J309 Allergic rhinitis, unspecified: Secondary | ICD-10-CM | POA: Insufficient documentation

## 2013-07-07 DIAGNOSIS — J4489 Other specified chronic obstructive pulmonary disease: Secondary | ICD-10-CM | POA: Insufficient documentation

## 2013-07-07 DIAGNOSIS — I10 Essential (primary) hypertension: Secondary | ICD-10-CM | POA: Diagnosis not present

## 2013-07-07 DIAGNOSIS — F17200 Nicotine dependence, unspecified, uncomplicated: Secondary | ICD-10-CM | POA: Insufficient documentation

## 2013-07-07 DIAGNOSIS — D649 Anemia, unspecified: Secondary | ICD-10-CM | POA: Diagnosis not present

## 2013-07-07 DIAGNOSIS — Z9119 Patient's noncompliance with other medical treatment and regimen: Secondary | ICD-10-CM | POA: Insufficient documentation

## 2013-07-07 DIAGNOSIS — J449 Chronic obstructive pulmonary disease, unspecified: Secondary | ICD-10-CM | POA: Insufficient documentation

## 2013-07-07 DIAGNOSIS — R911 Solitary pulmonary nodule: Secondary | ICD-10-CM | POA: Insufficient documentation

## 2013-07-07 DIAGNOSIS — E669 Obesity, unspecified: Secondary | ICD-10-CM | POA: Diagnosis not present

## 2013-07-07 DIAGNOSIS — F411 Generalized anxiety disorder: Secondary | ICD-10-CM | POA: Diagnosis not present

## 2013-07-07 DIAGNOSIS — I509 Heart failure, unspecified: Secondary | ICD-10-CM | POA: Diagnosis not present

## 2013-07-07 DIAGNOSIS — R5381 Other malaise: Secondary | ICD-10-CM | POA: Diagnosis not present

## 2013-07-07 DIAGNOSIS — Z79899 Other long term (current) drug therapy: Secondary | ICD-10-CM | POA: Diagnosis not present

## 2013-07-07 DIAGNOSIS — Z8541 Personal history of malignant neoplasm of cervix uteri: Secondary | ICD-10-CM | POA: Diagnosis not present

## 2013-07-07 DIAGNOSIS — I499 Cardiac arrhythmia, unspecified: Secondary | ICD-10-CM | POA: Insufficient documentation

## 2013-07-07 DIAGNOSIS — Z888 Allergy status to other drugs, medicaments and biological substances status: Secondary | ICD-10-CM | POA: Diagnosis not present

## 2013-07-07 DIAGNOSIS — R5383 Other fatigue: Secondary | ICD-10-CM

## 2013-07-07 DIAGNOSIS — J90 Pleural effusion, not elsewhere classified: Secondary | ICD-10-CM | POA: Diagnosis not present

## 2013-07-07 LAB — BASIC METABOLIC PANEL
BUN: 11 mg/dL (ref 6–23)
CO2: 26 meq/L (ref 19–32)
Calcium: 9.5 mg/dL (ref 8.4–10.5)
Chloride: 102 mEq/L (ref 96–112)
Creatinine, Ser: 0.8 mg/dL (ref 0.50–1.10)
GFR calc Af Amer: 87 mL/min — ABNORMAL LOW (ref >90)
GFR calc non Af Amer: 75 mL/min — ABNORMAL LOW (ref >90)
Glucose, Bld: 129 mg/dL — ABNORMAL HIGH (ref 70–99)
POTASSIUM: 4.6 meq/L (ref 3.7–5.3)
SODIUM: 141 meq/L (ref 137–147)

## 2013-07-07 LAB — CBC WITH DIFFERENTIAL/PLATELET
BASOS PCT: 0 % (ref 0–1)
Basophils Absolute: 0 10*3/uL (ref 0.0–0.1)
EOS PCT: 1 % (ref 0–5)
Eosinophils Absolute: 0.1 10*3/uL (ref 0.0–0.7)
HCT: 34.8 % — ABNORMAL LOW (ref 36.0–46.0)
HEMOGLOBIN: 9.3 g/dL — AB (ref 12.0–15.0)
LYMPHS PCT: 24 % (ref 12–46)
Lymphs Abs: 1.9 10*3/uL (ref 0.7–4.0)
MCH: 18.4 pg — ABNORMAL LOW (ref 26.0–34.0)
MCHC: 26.7 g/dL — ABNORMAL LOW (ref 30.0–36.0)
MCV: 68.8 fL — ABNORMAL LOW (ref 78.0–100.0)
Monocytes Absolute: 1 10*3/uL (ref 0.1–1.0)
Monocytes Relative: 13 % — ABNORMAL HIGH (ref 3–12)
NEUTROS ABS: 4.9 10*3/uL (ref 1.7–7.7)
Neutrophils Relative %: 62 % (ref 43–77)
Platelets: 277 10*3/uL (ref 150–400)
RBC: 5.06 MIL/uL (ref 3.87–5.11)
RDW: 21.7 % — ABNORMAL HIGH (ref 11.5–15.5)
WBC: 7.9 10*3/uL (ref 4.0–10.5)

## 2013-07-07 LAB — I-STAT VENOUS BLOOD GAS, ED
ACID-BASE EXCESS: 8 mmol/L — AB (ref 0.0–2.0)
Bicarbonate: 33.8 mEq/L — ABNORMAL HIGH (ref 20.0–24.0)
O2 Saturation: 38 %
PO2 VEN: 22 mmHg — AB (ref 30.0–45.0)
TCO2: 35 mmol/L (ref 0–100)
pCO2, Ven: 53.5 mmHg — ABNORMAL HIGH (ref 45.0–50.0)
pH, Ven: 7.409 — ABNORMAL HIGH (ref 7.250–7.300)

## 2013-07-07 LAB — HEPATIC FUNCTION PANEL
ALBUMIN: 3.4 g/dL — AB (ref 3.5–5.2)
ALK PHOS: 96 U/L (ref 39–117)
ALT: 12 U/L (ref 0–35)
AST: 29 U/L (ref 0–37)
Bilirubin, Direct: <0.2 mg/dL (ref 0.0–0.3)
TOTAL PROTEIN: 7.8 g/dL (ref 6.0–8.3)
Total Bilirubin: <0.2 mg/dL — ABNORMAL LOW (ref 0.3–1.2)

## 2013-07-07 LAB — PRO B NATRIURETIC PEPTIDE: PRO B NATRI PEPTIDE: 37.3 pg/mL (ref 0–125)

## 2013-07-07 LAB — TROPONIN I: Troponin I: <0.3 ng/mL (ref <0.30)

## 2013-07-07 LAB — POC OCCULT BLOOD, ED: Fecal Occult Bld: NEGATIVE

## 2013-07-07 MED ORDER — PREDNISONE 20 MG PO TABS
60.0000 mg | ORAL_TABLET | Freq: Once | ORAL | Status: AC
  Administered 2013-07-07: 60 mg via ORAL
  Filled 2013-07-07: qty 3

## 2013-07-07 MED ORDER — IPRATROPIUM-ALBUTEROL 0.5-2.5 (3) MG/3ML IN SOLN
3.0000 mL | Freq: Once | RESPIRATORY_TRACT | Status: AC
  Administered 2013-07-07: 3 mL via RESPIRATORY_TRACT
  Filled 2013-07-07: qty 3

## 2013-07-07 MED ORDER — PREDNISONE 20 MG PO TABS: 50.0000 mg | ORAL_TABLET | Freq: Every day | ORAL | Status: DC

## 2013-07-07 MED ORDER — FERROUS SULFATE 325 (65 FE) MG PO TABS: 325.0000 mg | ORAL_TABLET | Freq: Every day | ORAL | Status: DC

## 2013-07-07 MED ORDER — ALPRAZOLAM 0.5 MG PO TABS
0.5000 mg | ORAL_TABLET | Freq: Once | ORAL | Status: AC
  Administered 2013-07-07: 0.5 mg via ORAL
  Filled 2013-07-07: qty 1

## 2013-07-07 MED ORDER — PREDNISONE 20 MG PO TABS: 40.0000 mg | ORAL_TABLET | Freq: Every day | ORAL | Status: DC

## 2013-07-07 NOTE — Discharge Instructions (Signed)
Please follow with your primary care doctor in the next 2 days for a check-up. They must obtain records for further management.   Do not hesitate to return to the Emergency Department for any new, worsening or concerning symptoms.    Anemia, Nonspecific Anemia is a condition in which the concentration of red blood cells or hemoglobin in the blood is below normal. Hemoglobin is a substance in red blood cells that carries oxygen to the tissues of the body. Anemia results in not enough oxygen reaching these tissues.  CAUSES  Common causes of anemia include:   Excessive bleeding. Bleeding may be internal or external. This includes excessive bleeding from periods (in women) or from the intestine.   Poor nutrition.   Chronic kidney, thyroid, and liver disease.  Bone marrow disorders that decrease red blood cell production.  Cancer and treatments for cancer.  HIV, AIDS, and their treatments.  Spleen problems that increase red blood cell destruction.  Blood disorders.  Excess destruction of red blood cells due to infection, medicines, and autoimmune disorders. SIGNS AND SYMPTOMS   Minor weakness.   Dizziness.   Headache.  Palpitations.   Shortness of breath, especially with exercise.   Paleness.  Cold sensitivity.  Indigestion.  Nausea.  Difficulty sleeping.  Difficulty concentrating. Symptoms may occur suddenly or they may develop slowly.  DIAGNOSIS  Additional blood tests are often needed. These help your health care provider determine the best treatment. Your health care provider will check your stool for blood and look for other causes of blood loss.  TREATMENT  Treatment varies depending on the cause of the anemia. Treatment can include:   Supplements of iron, vitamin B35, or folic acid.   Hormone medicines.   A blood transfusion. This may be needed if blood loss is severe.   Hospitalization. This may be needed if there is significant continual  blood loss.   Dietary changes.  Spleen removal. HOME CARE INSTRUCTIONS Keep all follow-up appointments. It often takes many weeks to correct anemia, and having your health care provider check on your condition and your response to treatment is very important. SEEK IMMEDIATE MEDICAL CARE IF:   You develop extreme weakness, shortness of breath, or chest pain.   You become dizzy or have trouble concentrating.  You develop heavy vaginal bleeding.   You develop a rash.   You have bloody or black, tarry stools.   You faint.   You vomit up blood.   You vomit repeatedly.   You have abdominal pain.  You have a fever or persistent symptoms for more than 2-3 days.   You have a fever and your symptoms suddenly get worse.   You are dehydrated.  MAKE SURE YOU:  Understand these instructions.  Will watch your condition.  Will get help right away if you are not doing well or get worse. Document Released: 02/15/2004 Document Revised: 09/09/2012 Document Reviewed: 07/03/2012 [facility] Patient Information 2015 [place], [place]. This information is not intended to replace advice given to you by your health care provider. Make sure you discuss any questions you have with your health care provider.

## 2013-07-07 NOTE — ED Notes (Addendum)
Pt reports she is very short of breathe, bilateral leg swelling, COPD exacerbation. Has not been taking lasix because too tired to use restroom. Pt on 3 L [place] at home. Pt still smoking and reports she turns her oxygen off when she does.

## 2013-07-07 NOTE — ED Provider Notes (Signed)
CSN: [id number]     Arrival date & time 07/07/13  1405 History   First MD Initiated Contact with Patient 07/07/13 1451     Chief Complaint  Patient presents with  . Fatigue  . COPD     (Consider location/radiation/quality/duration/timing/severity/associated sxs/prior Treatment) HPI  Alima Hutchins is a 68 y.o. female past medical history significant for hypertension, COPD (on 3 L nasal by nasal cannula at all times) , active daily smoker, hyperlipidemia, CHF complaining of fatigue, dyspnea on exertion and increasing bilateral peripheral edema worsening over the last week. Patient missed 2 days of Lasix, she started on steroids and antibiotics which she finished one week ago bronchitis. Patient denies chest pain, shortness of breath, cough, fever, chills, nausea or vomiting. She endorses a reduced by mouth intake and states that she has not been sleeping well lately, denies PND.   Past Medical History  Diagnosis Date  . Hypertension   . COPD (chronic obstructive pulmonary disease)      FEV-1 in 2008 was 63% with a diffusion capacity of 33%.   . Tobacco abuse      Smokes one pack a day since age 73.  Marland Kitchen History of cervical cancer 1982  . Pulmonary nodule march 2012    68mm RUL and RLL 1st seen march 2012 CT, ?progression 12/2012 CT  . 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 Bronchiectasis march 2012    On CT chest. RUL. Mild  . Medical non-compliance   . Chicken pox   . Seasonal allergies   . Arrhythmia   . Hyperlipidemia   . Tobacco abuse   . Chronic diastolic heart failure   . Chronic respiratory failure     Followed in Pulmonary clinic/ [facility]/ Ramaswamy  - 06/30/2012 desat to 86%  RA walking 50 ft, recovered to 90% at rest - 06/30/2012  Walked 1lpm x 3 laps @ 185 ft each stopped due to  Sob, no desat  rec 02 2lpm with activity and sleeping, ok at rest   . Generalized anxiety disorder   . Leg swelling     Venous doppler right 07/21/12  >>Neg    . MITRAL VALVE PROLAPSE   . Obesity, unspecified   . CHF (congestive heart failure)    Past Surgical History  Procedure Laterality Date  . Total abdominal hysterectomy     Family History  Problem Relation Age of Onset  . Other Father     MVA  . Esophageal cancer Mother    History  Substance Use Topics  . Smoking status: Current Every Day Smoker -- 0.50 packs/day for 44 years    Types: Cigarettes  . Smokeless tobacco: Never Used     Comment: down to 6 cigs per day, wants to do e cigs; I cautioned against that  . Alcohol Use: Yes     Comment: occasional   OB History   Grav Para Term Preterm Abortions TAB SAB Ect Mult Living                 Review of Systems  10 systems reviewed and found to be negative, except as noted in the HPI.   Allergies  Ambien  Home Medications   Prior to Admission medications   Medication Sig Start Date End Date Taking? Authorizing Jalyiah Shelley  albuterol (PROVENTIL HFA;VENTOLIN HFA) 108 (90 BASE) MCG/ACT inhaler Inhale 2 puffs into the lungs every 6 (six) hours as needed. 05/24/13 07/22/16  Yes Brand Males, MD  albuterol (PROVENTIL) (2.5 MG/3ML) 0.083% nebulizer solution Take 2.5 mg by nebulization every 6 (six) hours as needed for wheezing or shortness of breath.   Yes Historical Therma Lasure, MD  ALPRAZolam Duanne Moron) 0.5 MG tablet Take 1 tablet (0.5 mg total) by mouth 3 (three) times daily as needed for anxiety. 05/17/13  Yes Rowe Clack, MD  carvedilol (COREG) 3.125 MG tablet Take 1 tablet (3.125 mg total) by mouth 2 (two) times daily with a meal. 05/17/13  Yes Rowe Clack, MD  cloNIDine (CATAPRES) 0.1 MG tablet Take 1 tablet (0.1 mg total) by mouth 2 (two) times daily. 05/17/13  Yes Rowe Clack, MD  famotidine (PEPCID) 20 MG tablet Take 20 mg by mouth at bedtime.   Yes Historical Layza Summa, MD  fluticasone (FLONASE) 50 MCG/ACT nasal spray Place 2 sprays into both nostrils daily as needed for allergies or rhinitis.   Yes  Historical Adler Alton, MD  furosemide (LASIX) 40 MG tablet Take 1 tablet (40 mg total) by mouth daily. 05/17/13  Yes Rowe Clack, MD  losartan (COZAAR) 25 MG tablet Take 1 tablet (25 mg total) by mouth daily. 05/17/13  Yes Rowe Clack, MD  mometasone-formoterol (DULERA) 200-5 MCG/ACT AERO Inhale 2 puffs into the lungs 2 (two) times daily. 05/24/13  Yes Brand Males, MD  omeprazole (PRILOSEC) 20 MG capsule Take 20 mg by mouth every morning.   Yes Historical Vertis Scheib, MD  simvastatin (ZOCOR) 10 MG tablet Take 1 tablet (10 mg total) by mouth at bedtime. 08/31/12  Yes Webb Silversmith, NP  tiotropium (SPIRIVA) 18 MCG inhalation capsule Place 1 capsule (18 mcg total) into inhaler and inhale daily. 05/24/13  Yes Brand Males, MD  ferrous sulfate 325 (65 FE) MG tablet Take 1 tablet (325 mg total) by mouth daily. 07/07/13   Nicole Pisciotta, PA-C  predniSONE (DELTASONE) 20 MG tablet Take 2 tablets (40 mg total) by mouth daily. 07/07/13   Nicole Pisciotta, PA-C   BP 104/90  Pulse 95  Temp(Src) 98.2 F (36.8 C) (Oral)  Resp 17  SpO2 95% Physical Exam  Nursing note and vitals reviewed. Constitutional: She is oriented to person, place, and time. She appears well-developed and well-nourished. No distress.  HENT:  Head: Normocephalic and atraumatic.  Mouth/Throat: Oropharynx is clear and moist.  Eyes: Conjunctivae and EOM are normal. Pupils are equal, round, and reactive to light.  Neck: Normal range of motion. Neck supple.  Cardiovascular: Normal rate, regular rhythm and intact distal pulses.   Pulmonary/Chest: Effort normal. No stridor.  On nasal cannula at 3 L.  No wheezing, poor air movement in all fields especially at the bases.  Abdominal: Soft. Bowel sounds are normal. She exhibits no distension. There is no tenderness. There is no rebound and no guarding.  Musculoskeletal: Normal range of motion. She exhibits edema. She exhibits no tenderness.  2+ pitting edema to bilateral lower  extremities to mention  Neurological: She is alert and oriented to person, place, and time.  Psychiatric: She has a normal mood and affect.    ED Course  Procedures (including critical care time) Labs Review Labs Reviewed  CBC WITH DIFFERENTIAL - Abnormal; Notable for the following:    Hemoglobin 9.3 (*)    HCT 34.8 (*)    MCV 68.8 (*)    MCH 18.4 (*)    MCHC 26.7 (*)    RDW 21.7 (*)    Monocytes Relative 13 (*)    All other components within normal limits  BASIC METABOLIC PANEL - Abnormal; Notable for the following:    Glucose, Bld 129 (*)    GFR calc non Af Amer 75 (*)    GFR calc Af Amer 87 (*)    All other components within normal limits  HEPATIC FUNCTION PANEL - Abnormal; Notable for the following:    Albumin 3.4 (*)    Total Bilirubin <0.2 (*)    All other components within normal limits  I-STAT VENOUS BLOOD GAS, ED - Abnormal; Notable for the following:    pH, Ven 7.409 (*)    pCO2, Ven 53.5 (*)    pO2, Ven 22.0 (*)    Bicarbonate 33.8 (*)    Acid-Base Excess 8.0 (*)    All other components within normal limits  TROPONIN I  PRO B NATRIURETIC PEPTIDE  OCCULT BLOOD X 1 CARD TO LAB, STOOL  POC OCCULT BLOOD, ED    Imaging Review Dg Chest 2 View  07/07/2013   CLINICAL DATA:  Shortness of breath for 1 week  EXAM: CHEST  2 VIEW  COMPARISON:  May 24, 2013  FINDINGS: The heart size and mediastinal contours are stable. There is scarring of the right upper lobe and mild scarring or atelectasis of bilateral lung bases unchanged. There is minimal left pleural effusion. There is no focal pneumonia. The visualized skeletal structures are stable P  IMPRESSION: Chronic scarring of the right upper lobe unchanged. Mild scarring versus atelectasis of both lung bases. Minimal left pleural effusion.   Electronically Signed   By: Abelardo Diesel M.D.   On: 07/07/2013 14:57     EKG Interpretation   Date/Time:  Wednesday July 07 2013 14:17:47 EDT Ventricular Rate:  81 PR Interval:   156 QRS Duration: 72 QT Interval:  368 QTC Calculation: 427 R Axis:   79 Text Interpretation:  Normal sinus rhythm Cannot rule out Anterior infarct  , age undetermined Abnormal ECG No significant change since last tracing  Confirmed by GOLDSTON  MD, SCOTT [phone]) on 07/07/2013 4:03:18 PM      MDM   Final diagnoses:  Anemia  Fatigue    Filed Vitals:   07/07/13 1545 07/07/13 1630 07/07/13 1646 07/07/13 1745  BP: 130/68 106/65 132/79 104/90  Pulse: 94 102 98 95  Temp:      TempSrc:      Resp: 18 31 17 17   SpO2: 93% 92% 95% 95%    Medications  ipratropium-albuterol (DUONEB) 0.5-2.5 (3) MG/3ML nebulizer solution 3 mL (3 mLs Nebulization Given 07/07/13 1654)  ALPRAZolam (XANAX) tablet 0.5 mg (0.5 mg Oral Given 07/07/13 1748)  predniSONE (DELTASONE) tablet 60 mg (60 mg Oral Given 07/07/13 1748)    Chundra How is a 68 y.o. female presenting with fatigue and increasing peripheral edema. Patient self DC'd Lasix because she had to go the restroom to often she is to tired to do so. Patient was recently treated for bronchitis with antibiotics and steroids, states that she has felt bad since she stopped the antibiotics about a week ago. Patient is not wheezing on exam however there is poor air movement in all fields. EKG is nonischemic and troponin and BNP are normal. Chest x-ray with no signs of infiltrate and she has a normal white count. Patient is anemic with an H&H of 9.3 over 34.8 this is dropped 2 points in 3 months. Patient denies any melanotic or dark stools. Guaiac is negative. Advised close followup with primary care physician and we'll supplement iron and started on a steroid dose  pack  This is a shared visit with the attending physician who personally evaluated the patient and agrees with the care plan.   Evaluation does not show pathology that would require ongoing emergent intervention or inpatient treatment. Pt is hemodynamically stable and mentating appropriately. Discussed  findings and plan with patient/guardian, who agrees with care plan. All questions answered. Return precautions discussed and outpatient follow up given.   New Prescriptions   FERROUS SULFATE 325 (65 FE) MG TABLET    Take 1 tablet (325 mg total) by mouth daily.   PREDNISONE (DELTASONE) 20 MG TABLET    Take 2 tablets (40 mg total) by mouth daily.        Monico Blitz, PA-C 07/07/13 1804

## 2013-07-08 ENCOUNTER — Telehealth: Payer: Self-pay | Admitting: Internal Medicine

## 2013-07-08 NOTE — Telephone Encounter (Signed)
Pt call stated that she went to the ER and was release the same day with anemia. Pt was advise to follow up with PCP within 2 days and she was advise go see her pulmonary on Monday and Tuesday for COPD. Pt was wondering if Dr. Asa Lente can work her in for the ER follow up and does she need to continue with the pulmonaly appt? Please advise.

## 2013-07-08 NOTE — Telephone Encounter (Signed)
Sorry as i cannot make room on my schedule tomorrow and am not in clinic next week She should a) keep appt with pulm next week and b) schedule apt for my return week after next for anemia - or see one of my partners next week. Thanks!

## 2013-07-09 NOTE — Telephone Encounter (Signed)
Pt has an appt with Dr. Asa Lente.

## 2013-07-09 NOTE — ED Provider Notes (Signed)
Medical screening examination/treatment/procedure(s) were conducted as a shared visit with non-physician practitioner(s) and myself.  I personally evaluated the patient during the encounter.   EKG Interpretation   Date/Time:  Wednesday July 07 2013 14:17:47 EDT Ventricular Rate:  81 PR Interval:  156 QRS Duration: 72 QT Interval:  368 QTC Calculation: 427 R Axis:   79 Text Interpretation:  Normal sinus rhythm Cannot rule out Anterior infarct  , age undetermined Abnormal ECG No significant change since last tracing  Confirmed by GOLDSTON  MD, SCOTT ([id number]) on 07/07/2013 4:03:18 PM       Patient with fatigue and COPD exacerbation. No dyspnea on my exam after a breathing treatment. Mildly lower hemoglobin, no signs of acute blood loss with negative stool card. At this time she feels well enough for discharge, will d/c with close PCP f/u.  Ephraim Hamburger, MD 07/09/13 [phone]

## 2013-07-12 ENCOUNTER — Telehealth: Payer: Self-pay | Admitting: Internal Medicine

## 2013-07-12 ENCOUNTER — Encounter (HOSPITAL_COMMUNITY): Payer: Self-pay

## 2013-07-12 ENCOUNTER — Encounter (HOSPITAL_COMMUNITY)
Admission: RE | Admit: 2013-07-12 | Discharge: 2013-07-12 | Disposition: A | Discharge status: Home / Self Care | Payer: Medicare Other | Source: Ambulatory Visit | Attending: Internal Medicine | Admitting: Internal Medicine

## 2013-07-12 VITALS — BP 106/64 | HR 105 | Resp 24 | Ht 64.0 in | Wt 226.4 lb

## 2013-07-12 DIAGNOSIS — J441 Chronic obstructive pulmonary disease with (acute) exacerbation: Secondary | ICD-10-CM

## 2013-07-12 NOTE — Progress Notes (Addendum)
[name] 68 y.o. female Pulmonary Rehab Orientation Note Patient arrived today in Cardiac and Pulmonary Rehab for orientation to Pulmonary Rehab. She was transported from [facility] drop off via wheel chair. She does carry portable oxygen, but upon further inspection, patients O2 tank was near empty. Patient was switched over to our 02 take at 3 liters which is her home prescription. Per pt,she uses oxygen continuously. She does have a working Conservation officer, nature at home. Patient also stated her portable tanks at home are also empty, which she notified supplier this am, and [facility] called and can"t deliver until tomorrow. Patient also noted to desaturate to the low 70s with exertion on 3 liters O2, but recovers back to 90 with PLB and rest. Dr. [name] notified but no new orders received. Pt does have appt with him in the am. Color good, skin warm and dry. Patient is oriented to time and place. Patient's medical history and medications reviewed. Heart rate is tachycardic, breath sounds were diminished throughout. Grip strength equal, strong. Distal pulses palpable. Trace amt of lower extremity edema noted. Patient reports she does take medications as prescribed. Patient states follows a Regular diet. The patient reports no specific efforts to gain or lose weight.Patient's weight will be monitored closely. Demonstration and practice of PLB using pulse oximeter. Patient able to return demonstration satisfactorily. Safety and hand hygiene in the exercise area reviewed with patient. Patient voices understanding of the information reviewed. Department expectations discussed with patient and achievable goals were set. The patient shows enthusiasm about attending the program and we look forward to working with this nice lady. The patient is scheduled for a 6 min walk test on Thursday 07/15/13 during her first exercise session. Upon completion of orientation appt, and with Dr. Golden Pop confirmation that patients  desaturations are within patients norm, patient was transported to her home a short distance from the hospital on her own O2, and instructed to remain on her concentrator until new portable O2 tanks are delivered tomorrow. Patient verbalized understanding and teach back noted.  45 minutes was spent on a variety of activities such as assessment of the patient, obtaining baseline data including height, weight, BMI, and grip strength, verifying medical history, allergies, and current medications, and teaching patient strategies for performing tasks with less respiratory effort with emphasis on pursed lip breathing.

## 2013-07-12 NOTE — Telephone Encounter (Signed)
D/w Ramon Dredge of rehab. Sorted out

## 2013-07-12 NOTE — Progress Notes (Signed)
[name] 68 y.o. female  Initial Psychosocial Assessment  Pt psychosocial assessment reveals pt's son lives with her. Pt is currently unemployed. Pt hobbies include playing games on the computer and "facebooking". She does enjoy going out to dinner occasionally but the weather/heat has kept her from doing so lately. Pt reports her stress level is moderate. Areas of stress/anxiety include her Health and Family. She sometimes worries about her son's health problems, and has some financial stress as she has the primary income for the she and her son. She reports he is unemployed because of his heart failure. Pt does exhibit exhibit signs of depression. Signs of depression include anxiety and difficulty falling asleep and difficulty maintaining sleep. She is prescribed anxiety medication that she uses frequently as prescribed. Pt shows fair  coping skills with positive outlook .  Offered emotional support and reassurance. Will continuously monitor and evaluate progress toward psychosocial goal(s).  Goal(s): Improved management of stress, anxiety, and depression  Improved coping skills  Help patient work toward returning to meaningful activities that improve patient's QOL and are attainable with patient's lung disease   07/12/2013 5:23 PM

## 2013-07-13 ENCOUNTER — Ambulatory Visit: Payer: Medicare Other | Admitting: Internal Medicine

## 2013-07-15 ENCOUNTER — Ambulatory Visit: Payer: Medicare Other | Admitting: Internal Medicine

## 2013-07-15 ENCOUNTER — Encounter (HOSPITAL_COMMUNITY)
Admission: RE | Admit: 2013-07-15 | Discharge: 2013-07-15 | Disposition: A | Discharge status: Home / Self Care | Payer: Medicare Other | Source: Ambulatory Visit | Attending: Internal Medicine | Admitting: Internal Medicine

## 2013-07-19 ENCOUNTER — Telehealth (HOSPITAL_COMMUNITY): Payer: Self-pay

## 2013-07-19 ENCOUNTER — Encounter (HOSPITAL_COMMUNITY): Payer: Self-pay

## 2013-07-19 NOTE — Progress Notes (Signed)
[facility] [place] Pulmonary Rehabilitation Baseline Outcomes Assessment   Anthropometrics:    Height (inches): 64   Weight (kg): 102.7   Grip strength was measured using a Dynamometer.  The patient's highest score was a 22.  Functional Status/Exercise Capacity:   [name] had a resting heart rate of 105 BPM, a resting blood pressure of 106/64, and an oxygen saturation of 90 % on 3 liters of O2.  [name] attempted a 6MWT on 07/15/13.  Patient was unable to perform due to low oxygen saturations.   Dyspnea Measures:   The [facility] is a simple and standardized method of classifying disability in patients with COPD.  The assessment correlates disability and dyspnea.  At entrance the patient scored a 2. The scale is provided below.   0= I only get breathless with strenuous exercise. 1= I get short of breath when hurrying on level ground or walking up a slight incline. 2= On level ground, I walk slower than people of the same age because of breathlessness, or have to stop for breath when walking at my own pace. 3= I stop for breath after walking 100 yards or after a few minutes on level ground. 4=I am too breathless to leave the house or I am breathless when dressing.     The patient completed the [place] ([facility]).  This questionnaire relates activities of daily living and shortness of breath.  The score ranges from 0-120, a higher score relates to severe shortness of breath during activities of daily living. The patient's score at entrance was 110.  Quality of Life:   Ferrans and Powers Quality of Life Index Pulmonary Version is used to assess the patients satisfaction in different domains of their life; health and functioning, socioeconomic, psychological/spiritual, and family. The overall score is recorded out of 30 points.  The patient's goal is to achieve an overall score of 21 or higher.  [name] received a 13.37 at  entrance.    The Patient Health Questionnaire (PHQ-2) is a first step approach for the screening of depression.  If the patient scores positive on the PHQ-2 the patient should be further assessed with the PHQ-9.  The Patient Health Questionnaire (PHQ-9) assesses the degree of depression.  Depression is important to monitor and track in pulmonary patients due to its prevalence in the population.  If the patient advances to the PHQ-9 the goal is to score less than 4 on this assessment.  [name] scored a 2 on the PHQ-2 and a 13 on the PHQ-9 at entrance.  Clinical Assessment Tools:   The COPD Assessment Test (CAT) is a measurement tool to quantify how much of an impact the disease has on the patient's life.  This assessment aids the Pulmonary Rehab Team in designing the patients individualized treatment plan.  A CAT score ranges from 0-40.  A score of 10 or below indicates that COPD has a low impact on the patient's life whereas a score of 30 or higher indicates a severe impact. The patient's goal is a decrease of 1 point from entrance to discharge.  [name] had a CAT score of 31 at entrance.  Nutrition:   The "Rate My Plate" is a dietary assessment that quantifies the balance of a patient's diet.  This tool allows the Pulmonary Rehab Team to key in on the areas of the patient's diet that needs improving.  The team can then focus their nutritional education on those areas.  If the patient scores 24-40, this means there are many ways they can make their eating habits healthier, 41-57 states that there are some ways they can make their eating habits healthier and a score of 58-72 states that they are making many healthy choices.  The patient's goal is to achieve a score of 49 or higher on this assessment.  [name] scored a 51 at entrance.  Oxygen Compliance:   Patient is currently on 2 liters at rest, 2 liters at night, and 3 liters for exercise.  The patient is not currently using a cpap at night.  The patient is  currently compliant.  The patient states that they do not have barriers that keep them from using their oxygen.    Education:   [name] will attend education classes during the course of Pulmonary Rehab.  Education classes that will be offered to the patient are Activities of Daily Living and Energy Conservation, Pursed Lip Breathing and Diaphragmatic Breathing, Nutrition, Exercise for the Pulmonary Patient, Warning Signs of Infection, Chronic Lung Disease, Advanced Directives, Medications, and Stress and Meditation.  The patient completed an assessment at the entrance of the program and will complete it again upon discharge to demonstrate the level of understanding provided by the educational classes.  This assessment includes 14 questions regarding all of the education topics above.  [name] achieved a score of 12/14 at entrance.  Smoking Cessation:  The patient is smoking 1-3 cigarettes a day and also utilizing an e-cig.  Exercise:   [name] will be provided with an individualized Home Exercise Prescription (HEP) at the entrance of the program.  The patient will be followed by the Pulmonary Exercise Physiologist throughout the program to assist with the progression of the frequency, intensity, time, and type of exercise. The patient's long-term goal is to be exercising 30-60 minutes, 3-5 days per week. At entrance, the patient was exercising 0 days at home.

## 2013-07-20 ENCOUNTER — Encounter (HOSPITAL_COMMUNITY): Payer: Medicare Other

## 2013-07-20 ENCOUNTER — Ambulatory Visit (INDEPENDENT_AMBULATORY_CARE_PROVIDER_SITE_OTHER): Payer: Medicare Other | Admitting: Internal Medicine

## 2013-07-20 ENCOUNTER — Encounter: Payer: Self-pay | Admitting: Internal Medicine

## 2013-07-20 ENCOUNTER — Other Ambulatory Visit (INDEPENDENT_AMBULATORY_CARE_PROVIDER_SITE_OTHER): Payer: Medicare Other

## 2013-07-20 ENCOUNTER — Ambulatory Visit: Payer: Medicare Other | Admitting: Internal Medicine

## 2013-07-20 VITALS — BP 128/78 | HR 108 | Temp 97.3°F | Ht 64.0 in | Wt 237.4 lb

## 2013-07-20 DIAGNOSIS — J9610 Chronic respiratory failure, unspecified whether with hypoxia or hypercapnia: Secondary | ICD-10-CM | POA: Diagnosis not present

## 2013-07-20 DIAGNOSIS — R911 Solitary pulmonary nodule: Secondary | ICD-10-CM

## 2013-07-20 DIAGNOSIS — D6489 Other specified anemias: Secondary | ICD-10-CM

## 2013-07-20 DIAGNOSIS — R0902 Hypoxemia: Secondary | ICD-10-CM

## 2013-07-20 DIAGNOSIS — J9611 Chronic respiratory failure with hypoxia: Secondary | ICD-10-CM

## 2013-07-20 DIAGNOSIS — D649 Anemia, unspecified: Secondary | ICD-10-CM | POA: Insufficient documentation

## 2013-07-20 LAB — CBC WITH DIFFERENTIAL/PLATELET
Basophils Absolute: 0 10*3/uL (ref 0.0–0.1)
Basophils Relative: 0.4 % (ref 0.0–3.0)
Eosinophils Absolute: 0.1 10*3/uL (ref 0.0–0.7)
Eosinophils Relative: 1.5 % (ref 0.0–5.0)
HCT: 35.9 % — ABNORMAL LOW (ref 36.0–46.0)
Hemoglobin: 10.1 g/dL — ABNORMAL LOW (ref 12.0–15.0)
Lymphocytes Relative: 17.3 % (ref 12.0–46.0)
Lymphs Abs: 1.7 10*3/uL (ref 0.7–4.0)
MCHC: 28.3 g/dL — ABNORMAL LOW (ref 30.0–36.0)
MCV: 70 fl — ABNORMAL LOW (ref 78.0–100.0)
Monocytes Absolute: 1.1 10*3/uL — ABNORMAL HIGH (ref 0.1–1.0)
Monocytes Relative: 10.9 % (ref 3.0–12.0)
Neutro Abs: 6.7 10*3/uL (ref 1.4–7.7)
Neutrophils Relative %: 69.9 % (ref 43.0–77.0)
Platelets: 531 10*3/uL — ABNORMAL HIGH (ref 150.0–400.0)
RBC: 5.14 Mil/uL — ABNORMAL HIGH (ref 3.87–5.11)
RDW: 30.3 % — ABNORMAL HIGH (ref 11.5–15.5)
WBC: 9.6 10*3/uL (ref 4.0–10.5)

## 2013-07-20 LAB — FERRITIN: Ferritin: 18.3 ng/mL (ref 10.0–291.0)

## 2013-07-20 NOTE — Assessment & Plan Note (Signed)
GOLD III, O2 dep Frequent exacerbations, complicated by noncompliance and high cost of medications Frequent hospitalizations for same reviewed, last February 2015 Follows with pulmonary for same, currently ongoing pulmonary rehabilitation Importance of compliance reviewed, no changes recommended

## 2013-07-20 NOTE — Patient Instructions (Signed)
It was good to see you today.  We have reviewed your prior records including labs and tests today  Test(s) ordered today. Your results will be released to [place] (or called to you) after review, usually within 72hours after test completion. If any changes need to be made, you will be notified at that same time.  Medications reviewed and updated, no changes recommended at this time.  We will talk with Dr [name] about what needs to happen next with rehab and other teting  Please keep scheduled followup as planned, call sooner if problems.

## 2013-07-20 NOTE — Assessment & Plan Note (Signed)
Remote history of iron deficiency anemia, resolved with TAH No prior colo but FOB neg x 1 06/2013 in ED Recheck labs now with ferritin continue FeS as recommended to start last week

## 2013-07-20 NOTE — Progress Notes (Signed)
   Subjective:    Patient ID: [name], female    DOB: [birth year], 68 y.o.   MRN: [id number]  HPI    Review of Systems     Objective:   Physical Exam        Assessment & Plan:

## 2013-07-20 NOTE — Progress Notes (Signed)
Subjective:    Patient ID: [name], female    DOB: March 14, 1945, 68 y.o.   MRN: [id number]  HPI  Patient is here for ED follow up - seen for COPD flare, labs show progressive anemia Also reviewed chronic medical issues and interval medical events  Past Medical History  Diagnosis Date  . Hypertension   . COPD (chronic obstructive pulmonary disease)      FEV-1 in 2008 was 63% with a diffusion capacity of 33%.   . Tobacco abuse      Smokes one pack a day since age 27.  Marland Kitchen History of cervical cancer 1982  . Pulmonary nodule march 2012    50mm RUL and RLL 1st seen march 2012 CT, ?progression 12/2012 CT  . 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 Bronchiectasis march 2012    On CT chest. RUL. Mild  . Medical non-compliance   . Chicken pox   . Seasonal allergies   . Arrhythmia   . Hyperlipidemia   . Tobacco abuse   . Chronic diastolic heart failure   . Chronic respiratory failure     Followed in Pulmonary clinic/ [facility]/ Ramaswamy  - 06/30/2012 desat to 86%  RA walking 50 ft, recovered to 90% at rest - 06/30/2012  Walked 1lpm x 3 laps @ 185 ft each stopped due to  Sob, no desat  rec 02 2lpm with activity and sleeping, ok at rest   . Generalized anxiety disorder   . Leg swelling     Venous doppler right 07/21/12 >>Neg    . MITRAL VALVE PROLAPSE   . Obesity, unspecified   . CHF (congestive heart failure)     Review of Systems  Constitutional: Positive for fatigue. Negative for fever and unexpected weight change.  Respiratory: Positive for shortness of breath (chronic, at baseline). Negative for cough, chest tightness and wheezing.   Cardiovascular: Negative for chest pain, palpitations and leg swelling.  Psychiatric/Behavioral: Positive for sleep disturbance (freq falls asleep, but only asleep 10-15"). Negative for decreased concentration.       Objective:   Physical Exam  BP 128/78  Pulse 108  Temp(Src) 97.3 F (36.3 C)  (Oral)  Ht 5\' 4"  (1.626 m)  Wt 237 lb 6 oz (107.673 kg)  BMI 40.73 kg/m2  SpO2 90% Wt Readings from Last 3 Encounters:  07/20/13 237 lb 6 oz (107.673 kg)  07/12/13 226 lb 6.6 oz (102.7 kg)  05/24/13 226 lb 9.6 oz (102.785 kg)   Constitutional: She is overweight, but appears well-developed and well-nourished. No distress. heavy cigarette smell Neck: Normal range of motion. Neck supple. No JVD present. No thyromegaly present.  Cardiovascular: Normal rate, regular rhythm and distant heart sounds.  No murmur heard. No BLE edema. Pulmonary/Chest: slightly increased effort at rest and breath sounds diminshed. No respiratory distress. She has no wheezes.  Psychiatric: She has a mildly anxious mood and affect. Her behavior is normal. Judgment and thought content normal.   Lab Results  Component Value Date   WBC 7.9 07/07/2013   HGB 9.3* 07/07/2013   HCT 34.8* 07/07/2013   PLT 277 07/07/2013   GLUCOSE 129* 07/07/2013   CHOL 187 08/26/2012   TRIG 109.0 08/26/2012   HDL 33.50* 08/26/2012   LDLCALC 132* 08/26/2012   ALT 12 07/07/2013   AST 29 07/07/2013   NA 141 07/07/2013   K 4.6 07/07/2013   CL 102 07/07/2013   CREATININE 0.80  07/07/2013   BUN 11 07/07/2013   CO2 26 07/07/2013   TSH 2.80 08/26/2012   Iron/TIBC/Ferritin No results found for this basename: iron,  tibc,  ferritin    No results found.     Assessment & Plan:   Problem List Items Addressed This Visit   Anemia - Primary     Remote history of iron deficiency anemia, resolved with TAH No prior colo but FOB neg x 1 06/2013 in ED Recheck labs now with ferritin continue FeS as recommended to start last week     Relevant Orders      CBC with Differential      Ferritin   Chronic respiratory failure     GOLD III, O2 dep Frequent exacerbations, complicated by noncompliance and high cost of medications Frequent hospitalizations for same reviewed, last February 2015 Follows with pulmonary for same, currently ongoing pulmonary  rehabilitation Importance of compliance reviewed, no changes recommended    Pulmonary nodule     Reviewed 12/2012 CT angio changes - Per pulm, planned follow up CT chest w/ cm for 12/2013 Working on smoking cessation, using "vape"

## 2013-07-20 NOTE — Assessment & Plan Note (Signed)
Reviewed 12/2012 CT angio changes - Per pulm, planned follow up CT chest w/ cm for 12/2013 Working on smoking cessation, using "vape"

## 2013-07-20 NOTE — Addendum Note (Signed)
Addended by: Gwendolyn Grant A on: 07/20/2013 09:05 PM   Modules accepted: Orders

## 2013-07-20 NOTE — Progress Notes (Signed)
Pre visit review using our clinic review tool, if applicable. No additional management support is needed unless otherwise documented below in the visit note. 

## 2013-07-21 ENCOUNTER — Telehealth: Payer: Self-pay | Admitting: Certified Registered Nurse Anesthetist

## 2013-07-21 NOTE — Telephone Encounter (Signed)
Message copied by Oletta Lamas on Wed Jul 21, 2013 11:56 AM ------      Message from: Rowe Clack      Created: Tue Jul 20, 2013  9:04 PM       Released to My Chart - please call to verify receipt of message:      Anemia (hemoglobin) is slightly improved since 07/07/13 check, but now the platelets are elevated -      This is a nonspecific finding      Ferritin (iron) is borderline low so you should continue to take the iron pills 2x/day      Since there is no evidence for blood in your stool, there is no reason for a colonoscopy or GI evaluation at this time.      We will check the labs again in 2 weeks to monitor the effect of iron on your anemia - come into the office for lab only, no office visit needed (order already done)      please call sooner if problems and continue working with Dr [name] about your lungs!! ------

## 2013-07-21 NOTE — Telephone Encounter (Signed)
Spoke with Pt. She understands the results and the change in sig for her IRON. She stated she will go to the lab in 2 wks for repeat labs and will continue seeing Dr. Juanell Fairly for lungs.

## 2013-07-22 ENCOUNTER — Encounter (HOSPITAL_COMMUNITY): Payer: Medicare Other

## 2013-07-27 ENCOUNTER — Encounter (HOSPITAL_COMMUNITY): Payer: Medicare Other

## 2013-07-28 ENCOUNTER — Other Ambulatory Visit: Payer: Self-pay | Admitting: Internal Medicine

## 2013-07-28 DIAGNOSIS — R0602 Shortness of breath: Secondary | ICD-10-CM

## 2013-07-29 ENCOUNTER — Ambulatory Visit (INDEPENDENT_AMBULATORY_CARE_PROVIDER_SITE_OTHER): Payer: Self-pay | Admitting: Internal Medicine

## 2013-07-29 ENCOUNTER — Encounter (HOSPITAL_COMMUNITY): Payer: Medicare Other

## 2013-07-29 DIAGNOSIS — R0602 Shortness of breath: Secondary | ICD-10-CM

## 2013-07-29 LAB — PULMONARY FUNCTION TEST
FEF 25-75 POST: 0.49 L/s
FEF 25-75 Pre: 0.37 L/sec
FEF2575-%CHANGE-POST: 29 %
FEF2575-%PRED-POST: 27 %
FEF2575-%Pred-Pre: 21 %
FEV1-%Change-Post: 12 %
FEV1-%PRED-POST: 53 %
FEV1-%PRED-PRE: 47 %
FEV1-Post: 1 L
FEV1-Pre: 0.89 L
FEV1FVC-%CHANGE-POST: 2 %
FEV1FVC-%PRED-PRE: 61 %
FEV6-%CHANGE-POST: 8 %
FEV6-%Pred-Post: 85 %
FEV6-%Pred-Pre: 78 %
FEV6-Post: 1.99 L
FEV6-Pre: 1.82 L
FEV6FVC-%Change-Post: -1 %
FEV6FVC-%Pred-Post: 102 %
FEV6FVC-%Pred-Pre: 103 %
FVC-%CHANGE-POST: 9 %
FVC-%PRED-POST: 84 %
FVC-%Pred-Pre: 77 %
FVC-PRE: 1.85 L
FVC-Post: 2.03 L
POST FEV1/FVC RATIO: 49 %
PRE FEV1/FVC RATIO: 48 %
PRE FEV6/FVC RATIO: 99 %
Post FEV6/FVC ratio: 98 %

## 2013-07-29 NOTE — Progress Notes (Signed)
PFT done today. 

## 2013-08-02 ENCOUNTER — Other Ambulatory Visit: Payer: Self-pay | Admitting: Internal Medicine

## 2013-08-03 ENCOUNTER — Telehealth: Payer: Self-pay | Admitting: *Deleted

## 2013-08-03 ENCOUNTER — Other Ambulatory Visit: Payer: Self-pay

## 2013-08-03 ENCOUNTER — Telehealth: Payer: Self-pay | Admitting: Internal Medicine

## 2013-08-03 ENCOUNTER — Encounter (HOSPITAL_COMMUNITY): Payer: Medicare Other

## 2013-08-03 MED ORDER — FERROUS SULFATE 325 (65 FE) MG PO TABS: 325.0000 mg | ORAL_TABLET | Freq: Every day | ORAL | Status: DC

## 2013-08-03 NOTE — Telephone Encounter (Signed)
Pt called and stated the pharmacy never received script on her iron pill. Pete inform pt [facility] will [facility].../lmb

## 2013-08-03 NOTE — Telephone Encounter (Signed)
LVM that rx refill request was sent to the pharm.

## 2013-08-03 NOTE — Telephone Encounter (Signed)
Patient needs new refill script of ferrous sulf 325mg  sent to [facility] [address].

## 2013-08-04 ENCOUNTER — Telehealth: Payer: Self-pay | Admitting: *Deleted

## 2013-08-04 MED ORDER — FERROUS SULFATE 325 (65 FE) MG PO TABS: 325.0000 mg | ORAL_TABLET | Freq: Two times a day (BID) | ORAL | Status: DC

## 2013-08-04 NOTE — Telephone Encounter (Signed)
Left msg on triage stating md told her to take the iron pill twice a day. The rx that was sent in was only for 30 pills. Requesting updated script for 90 day. Called pt back inform her will resend for 90 day supply...[name]

## 2013-08-05 ENCOUNTER — Encounter (HOSPITAL_COMMUNITY): Payer: Medicare Other

## 2013-08-09 ENCOUNTER — Ambulatory Visit: Payer: Medicare Other | Admitting: Adult Health

## 2013-08-10 ENCOUNTER — Encounter (HOSPITAL_COMMUNITY): Payer: Medicare Other

## 2013-08-10 ENCOUNTER — Ambulatory Visit: Payer: Medicare Other | Admitting: Adult Health

## 2013-08-12 ENCOUNTER — Ambulatory Visit: Payer: Medicare Other | Admitting: Internal Medicine

## 2013-08-12 ENCOUNTER — Encounter (HOSPITAL_COMMUNITY): Payer: Medicare Other

## 2013-08-17 ENCOUNTER — Encounter (HOSPITAL_COMMUNITY): Payer: Medicare Other

## 2013-08-18 ENCOUNTER — Ambulatory Visit: Payer: Medicare Other | Admitting: Adult Health

## 2013-08-19 ENCOUNTER — Encounter (HOSPITAL_COMMUNITY): Payer: Medicare Other

## 2013-08-24 ENCOUNTER — Encounter (HOSPITAL_COMMUNITY): Payer: Medicare Other

## 2013-08-26 ENCOUNTER — Encounter (HOSPITAL_COMMUNITY): Payer: Medicare Other

## 2013-08-31 ENCOUNTER — Encounter (HOSPITAL_COMMUNITY): Payer: Medicare Other

## 2013-09-02 ENCOUNTER — Encounter (HOSPITAL_COMMUNITY): Payer: Medicare Other

## 2013-09-07 ENCOUNTER — Encounter (HOSPITAL_COMMUNITY): Payer: Medicare Other

## 2013-09-09 ENCOUNTER — Encounter (HOSPITAL_COMMUNITY): Payer: Medicare Other

## 2013-09-09 NOTE — Progress Notes (Signed)
Pulmonary Rehab Discharge Note:  Patient is being officially discharged from pulmonary rehabilitation. After orientation, patient attempted to do a 6 min walk test, but prior to beginning test, patient ambulated just a short distance to the bathroom. While on 3 liters O2, she desaturated into the 70s and was symptomatic. MD made aware and after much communication it has been decided that patient is too deconditioned and requires too much oxygen support to exercise. Patient was advised to make appointment with pulmonologist to discuss other options for rehabilitation and reasons for such notable symptomatic desaturation with minimal exertion. Patient never attended her first exercise session.

## 2013-09-09 NOTE — Addendum Note (Signed)
Encounter addended by: Willadean Carol, RN on: 09/09/2013 11:15 AM<BR>     Documentation filed: Notes Section

## 2013-09-14 ENCOUNTER — Encounter (HOSPITAL_COMMUNITY): Payer: Medicare Other

## 2013-09-16 ENCOUNTER — Encounter (HOSPITAL_COMMUNITY): Payer: Medicare Other

## 2013-09-21 ENCOUNTER — Encounter (HOSPITAL_COMMUNITY): Payer: Medicare Other

## 2013-09-22 IMAGING — CR DG CHEST 2V
2 series · 2 of 2 positions shown · non-contrast
Comparison: Prior CT scan of the chest 06/25/2012; most recent
chest x-ray 04/09/2010

CLINICAL DATA: Short of breath, cough

CHEST - 2 VIEW

[view not recorded (1 of 2)]
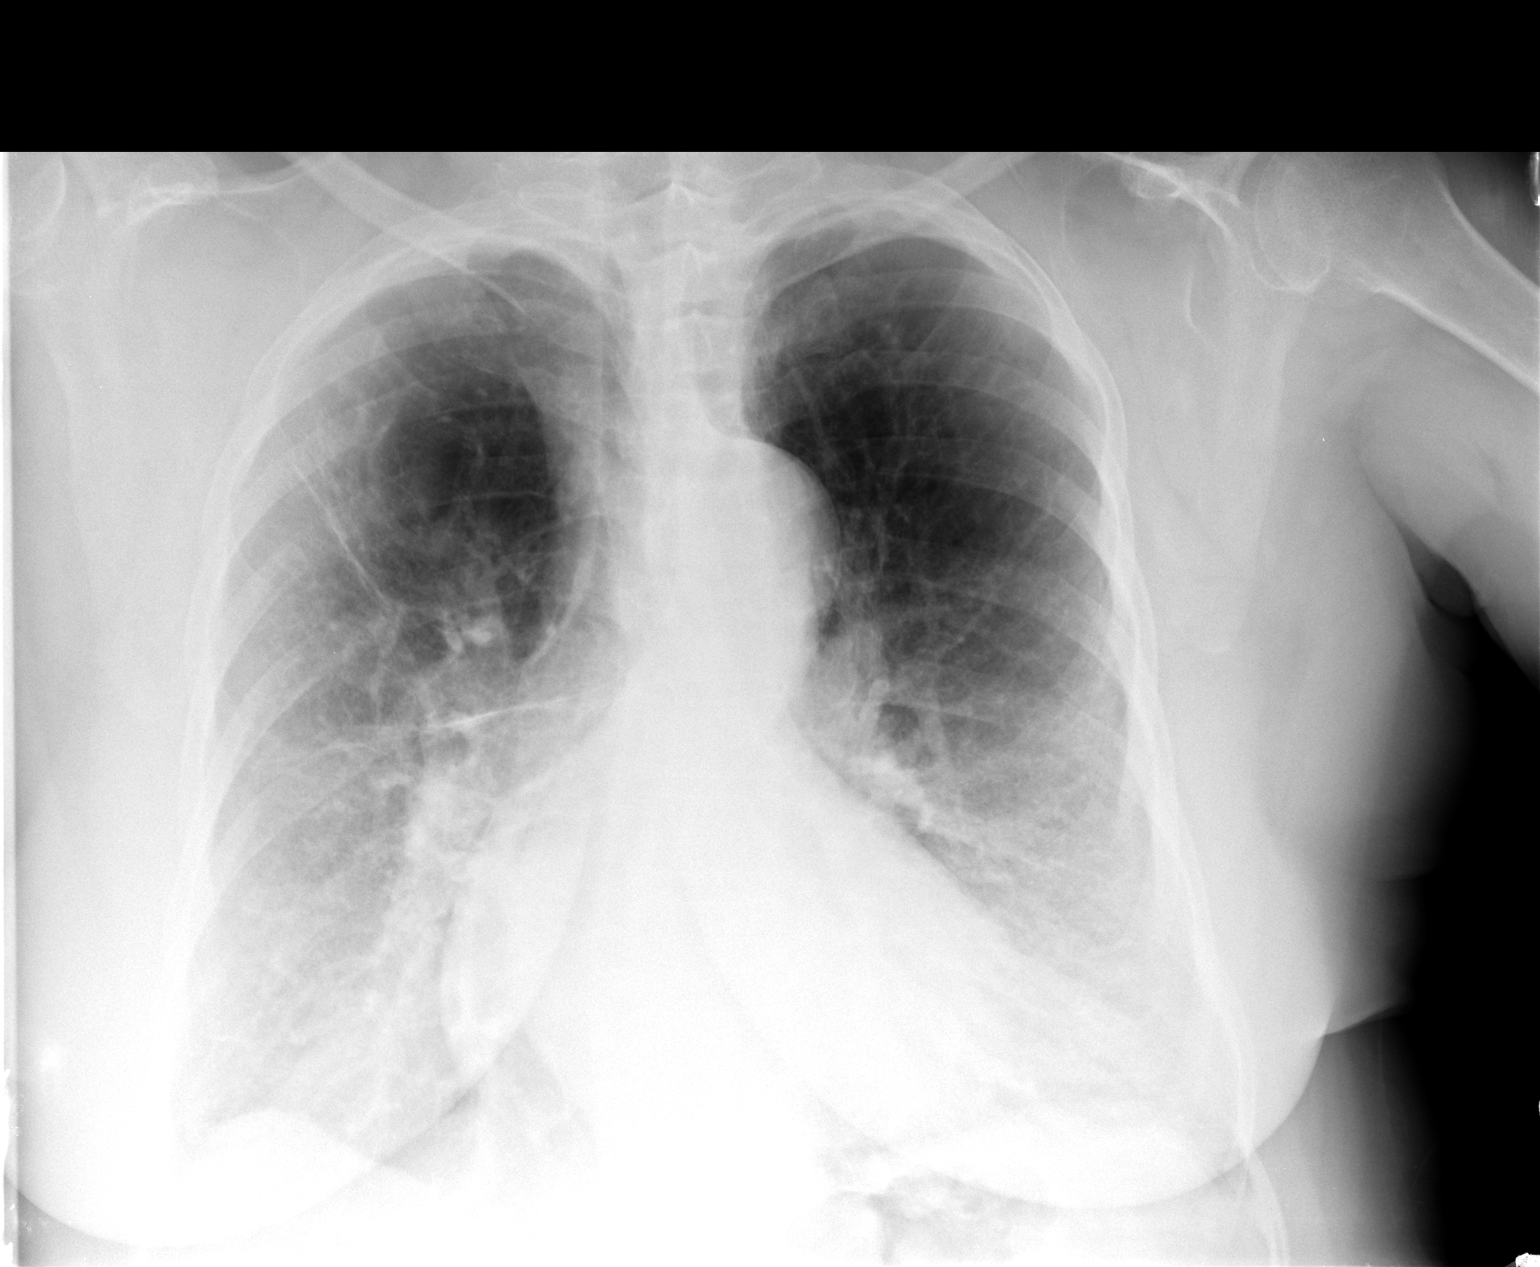

[view not recorded (2 of 2)]
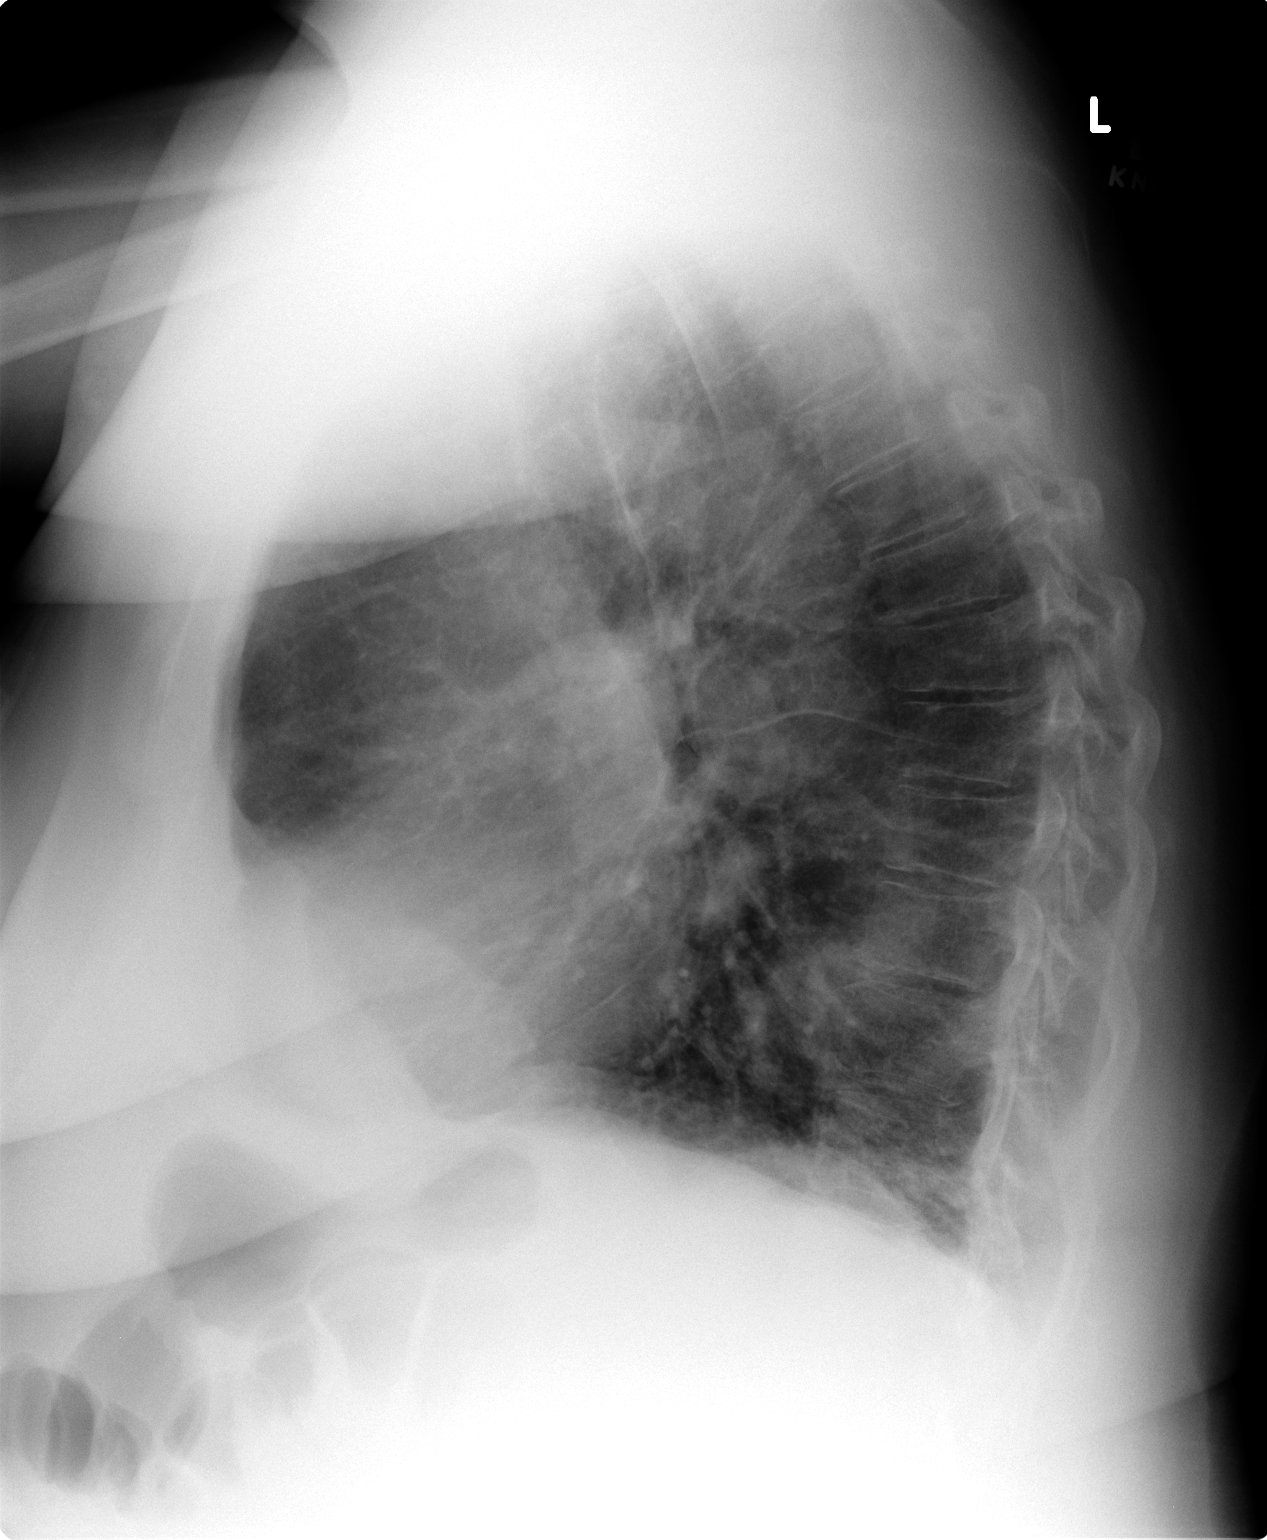

[2 of 2 positions shown; findings below may reference images not displayed]

FINDINGS: Similar appearance of extensive chronic changes
throughout the lungs including hyperexpansion, emphysema,
bronchitic changes and prominence of the interstitial markings.
Additionally, there is linear scarring in the right mid and upper
lung.  Mild cardiomegaly similar to prior.  Atherosclerotic and
highly tortuous thoracic aorta again noted.  There may be slightly
greater interstitial prominence today than seen previously.  No
acute osseous abnormality.
IMPRESSION: 1.  Perhaps slightly increased interstitial prominence compared to
prior imaging.  Differential considerations include mild
interstitial edema or atypical infectious process.
2.  Extensive chronic background lung changes including emphysema,
COPD and scarring in the right mid and upper lung.

## 2013-09-23 ENCOUNTER — Encounter (HOSPITAL_COMMUNITY): Payer: Medicare Other

## 2013-09-28 ENCOUNTER — Encounter (HOSPITAL_COMMUNITY): Payer: Medicare Other

## 2013-09-28 ENCOUNTER — Other Ambulatory Visit: Payer: Self-pay | Admitting: Internal Medicine

## 2013-09-30 ENCOUNTER — Encounter (HOSPITAL_COMMUNITY): Payer: Medicare Other

## 2013-10-05 ENCOUNTER — Encounter (HOSPITAL_COMMUNITY): Payer: Medicare Other

## 2013-10-05 ENCOUNTER — Other Ambulatory Visit: Payer: Self-pay | Admitting: Internal Medicine

## 2013-10-07 ENCOUNTER — Other Ambulatory Visit: Payer: Self-pay | Admitting: Internal Medicine

## 2013-10-07 ENCOUNTER — Encounter (HOSPITAL_COMMUNITY): Payer: Medicare Other

## 2013-10-12 ENCOUNTER — Encounter (HOSPITAL_COMMUNITY): Payer: Medicare Other

## 2013-10-14 ENCOUNTER — Encounter (HOSPITAL_COMMUNITY): Payer: Medicare Other

## 2013-10-19 ENCOUNTER — Encounter (HOSPITAL_COMMUNITY): Payer: Medicare Other

## 2013-10-21 ENCOUNTER — Encounter (HOSPITAL_COMMUNITY): Payer: Medicare Other

## 2013-10-26 ENCOUNTER — Encounter (HOSPITAL_COMMUNITY): Payer: Medicare Other

## 2013-10-27 ENCOUNTER — Encounter: Payer: Self-pay | Admitting: Internal Medicine

## 2013-10-27 DIAGNOSIS — D509 Iron deficiency anemia, unspecified: Secondary | ICD-10-CM

## 2013-10-28 ENCOUNTER — Encounter (HOSPITAL_COMMUNITY): Payer: Medicare Other

## 2013-11-02 ENCOUNTER — Encounter (HOSPITAL_COMMUNITY): Payer: Medicare Other

## 2013-11-10 ENCOUNTER — Other Ambulatory Visit (INDEPENDENT_AMBULATORY_CARE_PROVIDER_SITE_OTHER): Payer: Medicare Other

## 2013-11-10 DIAGNOSIS — D509 Iron deficiency anemia, unspecified: Secondary | ICD-10-CM

## 2013-11-10 LAB — CBC WITH DIFFERENTIAL/PLATELET
Basophils Absolute: 0 10*3/uL (ref 0.0–0.1)
Basophils Relative: 0.7 % (ref 0.0–3.0)
EOS PCT: 2.3 % (ref 0.0–5.0)
Eosinophils Absolute: 0.2 10*3/uL (ref 0.0–0.7)
HCT: 48.8 % — ABNORMAL HIGH (ref 36.0–46.0)
Hemoglobin: 15.2 g/dL — ABNORMAL HIGH (ref 12.0–15.0)
LYMPHS PCT: 30 % (ref 12.0–46.0)
Lymphs Abs: 2 10*3/uL (ref 0.7–4.0)
MCHC: 31 g/dL (ref 30.0–36.0)
MCV: 87.1 fl (ref 78.0–100.0)
MONO ABS: 0.8 10*3/uL (ref 0.1–1.0)
MONOS PCT: 11.9 % (ref 3.0–12.0)
NEUTROS PCT: 55.1 % (ref 43.0–77.0)
Neutro Abs: 3.7 10*3/uL (ref 1.4–7.7)
PLATELETS: 317 10*3/uL (ref 150.0–400.0)
RBC: 5.61 Mil/uL — AB (ref 3.87–5.11)
RDW: 15.7 % — ABNORMAL HIGH (ref 11.5–15.5)
WBC: 6.8 10*3/uL (ref 4.0–10.5)

## 2013-11-12 ENCOUNTER — Telehealth: Payer: Self-pay | Admitting: Internal Medicine

## 2013-11-12 NOTE — Telephone Encounter (Signed)
LVM for pt to call back.

## 2013-11-12 NOTE — Telephone Encounter (Signed)
Her CBC was abnormal, H and H were both elevated Please follow up soon

## 2013-11-12 NOTE — Telephone Encounter (Signed)
Patient is call for the results of her lab work

## 2013-11-15 ENCOUNTER — Encounter: Payer: Self-pay | Admitting: Internal Medicine

## 2013-11-15 NOTE — Telephone Encounter (Signed)
Spoke with pt and gave Dr. Ronnald Ramp' results

## 2013-11-15 NOTE — Telephone Encounter (Signed)
Patient called back.  She is requesting another call back.

## 2013-11-17 ENCOUNTER — Other Ambulatory Visit: Payer: Self-pay | Admitting: Internal Medicine

## 2013-11-23 ENCOUNTER — Encounter: Payer: Self-pay | Admitting: Internal Medicine

## 2013-11-23 DIAGNOSIS — D509 Iron deficiency anemia, unspecified: Secondary | ICD-10-CM

## 2013-11-23 DIAGNOSIS — E785 Hyperlipidemia, unspecified: Secondary | ICD-10-CM

## 2013-11-25 ENCOUNTER — Other Ambulatory Visit: Payer: Self-pay

## 2013-11-25 MED ORDER — CARVEDILOL 3.125 MG PO TABS: 3.1250 mg | ORAL_TABLET | Freq: Two times a day (BID) | ORAL | Status: DC

## 2013-11-25 MED ORDER — SIMVASTATIN 10 MG PO TABS: 10.0000 mg | ORAL_TABLET | Freq: Every day | ORAL | Status: DC

## 2013-11-25 MED ORDER — OMEPRAZOLE 20 MG PO CPDR: DELAYED_RELEASE_CAPSULE | ORAL | Status: DC

## 2013-11-25 MED ORDER — CLONIDINE HCL 0.1 MG PO TABS: ORAL_TABLET | ORAL | Status: DC

## 2013-12-03 ENCOUNTER — Other Ambulatory Visit: Payer: Self-pay | Admitting: Internal Medicine

## 2013-12-03 ENCOUNTER — Encounter: Payer: Self-pay | Admitting: Internal Medicine

## 2013-12-31 ENCOUNTER — Other Ambulatory Visit: Payer: Self-pay | Admitting: Internal Medicine

## 2014-01-04 ENCOUNTER — Other Ambulatory Visit: Payer: Self-pay | Admitting: Internal Medicine

## 2014-01-05 ENCOUNTER — Telehealth: Payer: Self-pay | Admitting: Internal Medicine

## 2014-01-05 NOTE — Telephone Encounter (Signed)
[facility] called [facility] that suppose to be faxed in by noon today. Please check

## 2014-01-05 NOTE — Telephone Encounter (Signed)
Spoke to pt and rx has been faxed to pharmacy. Requested pt to let me know if the pharmacy doesn't receive it, I will call in, in that case.

## 2014-02-16 ENCOUNTER — Ambulatory Visit: Payer: Medicare Other | Admitting: Internal Medicine

## 2014-02-16 ENCOUNTER — Encounter: Payer: Medicare Other | Admitting: Internal Medicine

## 2014-02-19 ENCOUNTER — Other Ambulatory Visit: Payer: Self-pay | Admitting: Internal Medicine

## 2014-02-23 ENCOUNTER — Telehealth: Payer: Self-pay | Admitting: Internal Medicine

## 2014-02-23 MED ORDER — TIOTROPIUM BROMIDE MONOHYDRATE 18 MCG IN CAPS: 18.0000 ug | ORAL_CAPSULE | Freq: Every day | RESPIRATORY_TRACT | Status: DC

## 2014-02-23 NOTE — Telephone Encounter (Signed)
Pt calling back states she needs dulera and ventolin also [phone]

## 2014-02-23 NOTE — Telephone Encounter (Signed)
Rx refill for Spiriva sent.

## 2014-03-11 ENCOUNTER — Telehealth: Payer: Self-pay | Admitting: Internal Medicine

## 2014-03-11 MED ORDER — MOMETASONE FURO-FORMOTEROL FUM 200-5 MCG/ACT IN AERO: INHALATION_SPRAY | RESPIRATORY_TRACT | Status: DC

## 2014-03-11 NOTE — Telephone Encounter (Signed)
Called and spoke with pt she is aware of refill that has been sent to the pharmacy. Nothing further is needed.

## 2014-04-01 ENCOUNTER — Other Ambulatory Visit: Payer: Self-pay | Admitting: Internal Medicine

## 2014-04-10 ENCOUNTER — Other Ambulatory Visit: Payer: Self-pay | Admitting: Internal Medicine

## 2014-06-03 ENCOUNTER — Other Ambulatory Visit: Payer: Self-pay | Admitting: Internal Medicine

## 2014-06-21 ENCOUNTER — Other Ambulatory Visit: Payer: Self-pay | Admitting: Internal Medicine

## 2014-07-04 ENCOUNTER — Other Ambulatory Visit: Payer: Self-pay

## 2014-07-04 MED ORDER — LOSARTAN POTASSIUM 25 MG PO TABS: 25.0000 mg | ORAL_TABLET | Freq: Every day | ORAL | Status: DC

## 2014-07-05 ENCOUNTER — Telehealth: Payer: Self-pay | Admitting: Internal Medicine

## 2014-07-08 MED ORDER — ALPRAZOLAM 0.5 MG PO TABS: 0.5000 mg | ORAL_TABLET | Freq: Three times a day (TID) | ORAL | Status: DC | PRN

## 2014-07-08 NOTE — Telephone Encounter (Signed)
1 month refill given. Needs to show to apt for further meds.

## 2014-07-08 NOTE — Addendum Note (Signed)
Addended by: Vertell Novak A on: 07/08/2014 03:57 PM   Modules accepted: Orders

## 2014-07-08 NOTE — Telephone Encounter (Signed)
Patient has an appointment on 6/21 with Dr. Doug Sou. She is wanting to know if she can get a prescription to last her until then. Please advise

## 2014-07-12 ENCOUNTER — Ambulatory Visit: Payer: Medicare Other | Admitting: Internal Medicine

## 2014-07-18 ENCOUNTER — Other Ambulatory Visit: Payer: Self-pay

## 2014-07-21 ENCOUNTER — Other Ambulatory Visit: Payer: Self-pay | Admitting: Internal Medicine

## 2014-07-26 ENCOUNTER — Encounter: Payer: Self-pay | Admitting: Internal Medicine

## 2014-07-26 ENCOUNTER — Ambulatory Visit (INDEPENDENT_AMBULATORY_CARE_PROVIDER_SITE_OTHER): Payer: Medicare Other | Admitting: Internal Medicine

## 2014-07-26 ENCOUNTER — Other Ambulatory Visit (INDEPENDENT_AMBULATORY_CARE_PROVIDER_SITE_OTHER): Payer: Medicare Other

## 2014-07-26 VITALS — BP 108/64 | HR 103 | Temp 97.8°F | Resp 20 | Ht 64.0 in | Wt 223.0 lb

## 2014-07-26 DIAGNOSIS — E785 Hyperlipidemia, unspecified: Secondary | ICD-10-CM | POA: Diagnosis not present

## 2014-07-26 DIAGNOSIS — D509 Iron deficiency anemia, unspecified: Secondary | ICD-10-CM

## 2014-07-26 DIAGNOSIS — F4322 Adjustment disorder with anxiety: Secondary | ICD-10-CM | POA: Diagnosis not present

## 2014-07-26 DIAGNOSIS — R7301 Impaired fasting glucose: Secondary | ICD-10-CM

## 2014-07-26 DIAGNOSIS — Z720 Tobacco use: Secondary | ICD-10-CM

## 2014-07-26 DIAGNOSIS — I10 Essential (primary) hypertension: Secondary | ICD-10-CM

## 2014-07-26 DIAGNOSIS — J9611 Chronic respiratory failure with hypoxia: Secondary | ICD-10-CM

## 2014-07-26 LAB — LIPID PANEL
Cholesterol: 174 mg/dL (ref 0–200)
HDL: 30 mg/dL — AB (ref >39.00)
NonHDL: 144
Total CHOL/HDL Ratio: 6
Triglycerides: 232 mg/dL — ABNORMAL HIGH (ref 0.0–149.0)
VLDL: 46.4 mg/dL — ABNORMAL HIGH (ref 0.0–40.0)

## 2014-07-26 LAB — CBC
HEMATOCRIT: 49.1 % — AB (ref 36.0–46.0)
HEMOGLOBIN: 15.9 g/dL — AB (ref 12.0–15.0)
MCHC: 32.4 g/dL (ref 30.0–36.0)
MCV: 88.2 fl (ref 78.0–100.0)
Platelets: 273 10*3/uL (ref 150.0–400.0)
RBC: 5.57 Mil/uL — ABNORMAL HIGH (ref 3.87–5.11)
RDW: 15.4 % (ref 11.5–15.5)
WBC: 6.4 10*3/uL (ref 4.0–10.5)

## 2014-07-26 LAB — HEMOGLOBIN A1C: Hgb A1c MFr Bld: 6.3 % (ref 4.6–6.5)

## 2014-07-26 LAB — COMPREHENSIVE METABOLIC PANEL
ALK PHOS: 86 U/L (ref 39–117)
ALT: 23 U/L (ref 0–35)
AST: 22 U/L (ref 0–37)
Albumin: 3.9 g/dL (ref 3.5–5.2)
BILIRUBIN TOTAL: 0.3 mg/dL (ref 0.2–1.2)
BUN: 9 mg/dL (ref 6–23)
CO2: 28 mEq/L (ref 19–32)
CREATININE: 1.05 mg/dL (ref 0.40–1.20)
Calcium: 9.6 mg/dL (ref 8.4–10.5)
Chloride: 104 mEq/L (ref 96–112)
GFR: 67.18 mL/min (ref >60.00)
Glucose, Bld: 116 mg/dL — ABNORMAL HIGH (ref 70–99)
Potassium: 3.9 mEq/L (ref 3.5–5.1)
SODIUM: 142 meq/L (ref 135–145)
Total Protein: 7.8 g/dL (ref 6.0–8.3)

## 2014-07-26 LAB — LDL CHOLESTEROL, DIRECT: Direct LDL: 113 mg/dL

## 2014-07-26 LAB — FERRITIN: Ferritin: 56.3 ng/mL (ref 10.0–291.0)

## 2014-07-26 NOTE — Progress Notes (Signed)
   Subjective:    Patient ID: [name], female    DOB: [birth year], 69 y.o.   MRN: [id number]  HPI The patient is a 69 YO female who is coming in today for medication follow up since it has been more than 1 year since she has been in. She is concerned that her xanax they were not wanting to refill. She takes it 2-3 times per day for her breathing as she gets panic attacks when she feels like she cannot breathe. She does not feel different right before doses are due. Occasionally takes an extra when she feels like her mood is labile. She is quick to have mood changes recently. Denies SI/HI. Denies having panic attack in the last year. Her breathing has been stable overall. She continues to smoke although is alternating with "vape" to decrease the cigarettes. Exercise tolerance is poor. Has not been using rescue more often lately.   Review of Systems  Constitutional: Positive for activity change and fatigue. Negative for fever, chills, appetite change and unexpected weight change.  HENT: Negative.   Eyes: Negative.   Respiratory: Positive for cough and shortness of breath. Negative for chest tightness and wheezing.   Cardiovascular: Negative for chest pain, palpitations and leg swelling.  Gastrointestinal: Negative for abdominal pain, constipation and abdominal distention.  Skin: Negative.   Psychiatric/Behavioral: Positive for dysphoric mood and agitation. Negative for suicidal ideas, hallucinations, behavioral problems, sleep disturbance, self-injury and decreased concentration. The patient is nervous/anxious.       Objective:   Physical Exam  Constitutional: She is oriented to person, place, and time. She appears well-developed and well-nourished.  Overweight and smelling strongly of cigarettes.   HENT:  Head: Normocephalic and atraumatic.  Eyes: EOM are normal.  Neck: Normal range of motion.  Cardiovascular: Normal rate and regular rhythm.   Pulmonary/Chest: Effort normal.  Some  scattered wheezing with reasonable airflow.   Abdominal: Soft. She exhibits no distension. There is no tenderness.  Musculoskeletal: She exhibits no edema.  Neurological: She is alert and oriented to person, place, and time. Coordination abnormal.  Uses walker for ambulation  Skin: Skin is warm and dry.   Filed Vitals:   07/26/14 0845  BP: 108/64  Pulse: 103  Temp: 97.8 F (36.6 C)  TempSrc: Oral  Resp: 20  Height: 5\' 4"  (1.626 m)  Weight: 223 lb (101.152 kg)  SpO2: 90%      Assessment & Plan:

## 2014-07-26 NOTE — Assessment & Plan Note (Signed)
BP well controlled on coreg, clonidine, lasix, losartan. Needs BMP so checking today and adjust as needed.

## 2014-07-26 NOTE — Assessment & Plan Note (Signed)
Using O2 for activity and night time. Taking spiriva, dulera and uses rescue when needed. Still smoking and explained to her that her lungs are going to get worse while she is still smoking. She is trying to quit using vapor but is still smoking.

## 2014-07-26 NOTE — Progress Notes (Signed)
Pre visit review using our clinic review tool, if applicable. No additional management support is needed unless otherwise documented below in the visit note. 

## 2014-07-26 NOTE — Assessment & Plan Note (Signed)
Still smoking, talked to her about the need to quit especially given her severe lung disease. Also reminded her that oxygen is a flamable gas and that smoking while using oxygen is a health hazard.

## 2014-07-26 NOTE — Patient Instructions (Signed)
We are checking the blood work today and will call you back with the results.   We will let you know if you still need to be taking the iron pills.   I would recommend trying to spread out the xanax if you are able since it is a medicine that can make you confused and increase your risk of falling. There are other safer medicines that can help the mood and the anxiety if you would like to try one of those to see if it helps.   You need to stop smoking as this is one of the worst things that you can do for the breathing and you health overall.   Smoking Cessation Quitting smoking is important to your health and has many advantages. However, it is not always easy to quit since nicotine is a very addictive drug. Oftentimes, people try 3 times or more before being able to quit. This document explains the best ways for you to prepare to quit smoking. Quitting takes hard work and a lot of effort, but you can do it. ADVANTAGES OF QUITTING SMOKING  You will live longer, feel better, and live better.  Your body will feel the impact of quitting smoking almost immediately.  Within 20 minutes, blood pressure decreases. Your pulse returns to its normal level.  After 8 hours, carbon monoxide levels in the blood return to normal. Your oxygen level increases.  After 24 hours, the chance of having a heart attack starts to decrease. Your breath, hair, and body stop smelling like smoke.  After 48 hours, damaged nerve endings begin to recover. Your sense of taste and smell improve.  After 72 hours, the body is virtually free of nicotine. Your bronchial tubes relax and breathing becomes easier.  After 2 to 12 weeks, lungs can hold more air. Exercise becomes easier and circulation improves.  The risk of having a heart attack, stroke, cancer, or lung disease is greatly reduced.  After 1 year, the risk of coronary heart disease is cut in half.  After 5 years, the risk of stroke falls to the same as a  nonsmoker.  After 10 years, the risk of lung cancer is cut in half and the risk of other cancers decreases significantly.  After 15 years, the risk of coronary heart disease drops, usually to the level of a nonsmoker.  If you are pregnant, quitting smoking will improve your chances of having a healthy baby.  The people you live with, especially any children, will be healthier.  You will have extra money to spend on things other than cigarettes. QUESTIONS TO THINK ABOUT BEFORE ATTEMPTING TO QUIT You may want to talk about your answers with your health care provider.  Why do you want to quit?  If you tried to quit in the past, what helped and what did not?  What will be the most difficult situations for you after you quit? How will you plan to handle them?  Who can help you through the tough times? Your family? Friends? A health care provider?  What pleasures do you get from smoking? What ways can you still get pleasure if you quit? Here are some questions to ask your health care provider:  How can you help me to be successful at quitting?  What medicine do you think would be best for me and how should I take it?  What should I do if I need more help?  What is smoking withdrawal like? How can I get information  on withdrawal? GET READY  Set a quit date.  Change your environment by getting rid of all cigarettes, ashtrays, matches, and lighters in your home, car, or work. Do not let people smoke in your home.  Review your past attempts to quit. Think about what worked and what did not. GET SUPPORT AND ENCOURAGEMENT You have a better chance of being successful if you have help. You can get support in many ways.  Tell your family, friends, and coworkers that you are going to quit and need their support. Ask them not to smoke around you.  Get individual, group, or telephone counseling and support. Programs are available at [employer] and health centers. Call your local health  department for information about programs in your area.  Spiritual beliefs and practices may help some smokers quit.  Download a "quit meter" on your computer to keep track of quit statistics, such as how long you have gone without smoking, cigarettes not smoked, and money saved.  Get a self-help book about quitting smoking and staying off tobacco. [place] yourself from urges to smoke. Talk to someone, go for a walk, or occupy your time with a task.  Change your normal routine. Take a different route to work. Drink tea instead of coffee. Eat breakfast in a different place.  Reduce your stress. Take a hot bath, exercise, or read a book.  Plan something enjoyable to do every day. Reward yourself for not smoking.  Explore interactive web-based programs that specialize in helping you quit. GET MEDICINE AND USE IT CORRECTLY Medicines can help you stop smoking and decrease the urge to smoke. Combining medicine with the above behavioral methods and support can greatly increase your chances of successfully quitting smoking.  Nicotine replacement therapy helps deliver nicotine to your body without the negative effects and risks of smoking. Nicotine replacement therapy includes nicotine gum, lozenges, inhalers, nasal sprays, and skin patches. Some may be available over-the-counter and others require a prescription.  Antidepressant medicine helps people abstain from smoking, but how this works is unknown. This medicine is available by prescription.  Nicotinic receptor partial agonist medicine simulates the effect of nicotine in your brain. This medicine is available by prescription. Ask your health care provider for advice about which medicines to use and how to use them based on your health history. Your health care provider will tell you what side effects to look out for if you choose to be on a medicine or therapy. Carefully read the information on the package. Do  not use any other product containing nicotine while using a nicotine replacement product.  RELAPSE OR DIFFICULT SITUATIONS Most relapses occur within the first 3 months after quitting. Do not be discouraged if you start smoking again. Remember, most people try several times before finally quitting. You may have symptoms of withdrawal because your body is used to nicotine. You may crave cigarettes, be irritable, feel very hungry, cough often, get headaches, or have difficulty concentrating. The withdrawal symptoms are only temporary. They are strongest when you first quit, but they will go away within 10-14 days. To reduce the chances of relapse, try to:  Avoid drinking alcohol. Drinking lowers your chances of successfully quitting.  Reduce the amount of caffeine you consume. Once you quit smoking, the amount of caffeine in your body increases and can give you symptoms, such as a rapid heartbeat, sweating, and anxiety.  Avoid smokers because they can make you want to smoke.  Do  not let weight gain distract you. Many smokers will gain weight when they quit, usually less than 10 pounds. Eat a healthy diet and stay active. You can always lose the weight gained after you quit.  Find ways to improve your mood other than smoking. FOR MORE INFORMATION  www.smokefree.gov  Document Released: 01/01/2001 Document Revised: 05/24/2013 Document Reviewed: 04/18/2011 [facility] Patient Information 2015 [place], [place]. This information is not intended to replace advice given to you by your health care provider. Make sure you discuss any questions you have with your health care provider.

## 2014-07-26 NOTE — Assessment & Plan Note (Signed)
Talked to her about the need to space out xanax if able and the option for a different medicine for her anxiety which would likely do a better job. She will think about it. At this time her mood problems are not well controlled.

## 2014-07-26 NOTE — Assessment & Plan Note (Signed)
Checking lipid panel today and adjust simvastatin as needed.

## 2014-08-03 IMAGING — CR DG CHEST 2V
2 series · 2 of 2 positions shown · non-contrast
Comparison: May 24, 2013

CLINICAL DATA: Shortness of breath for 1 week

EXAM:
CHEST  2 VIEW

[w chest pa]
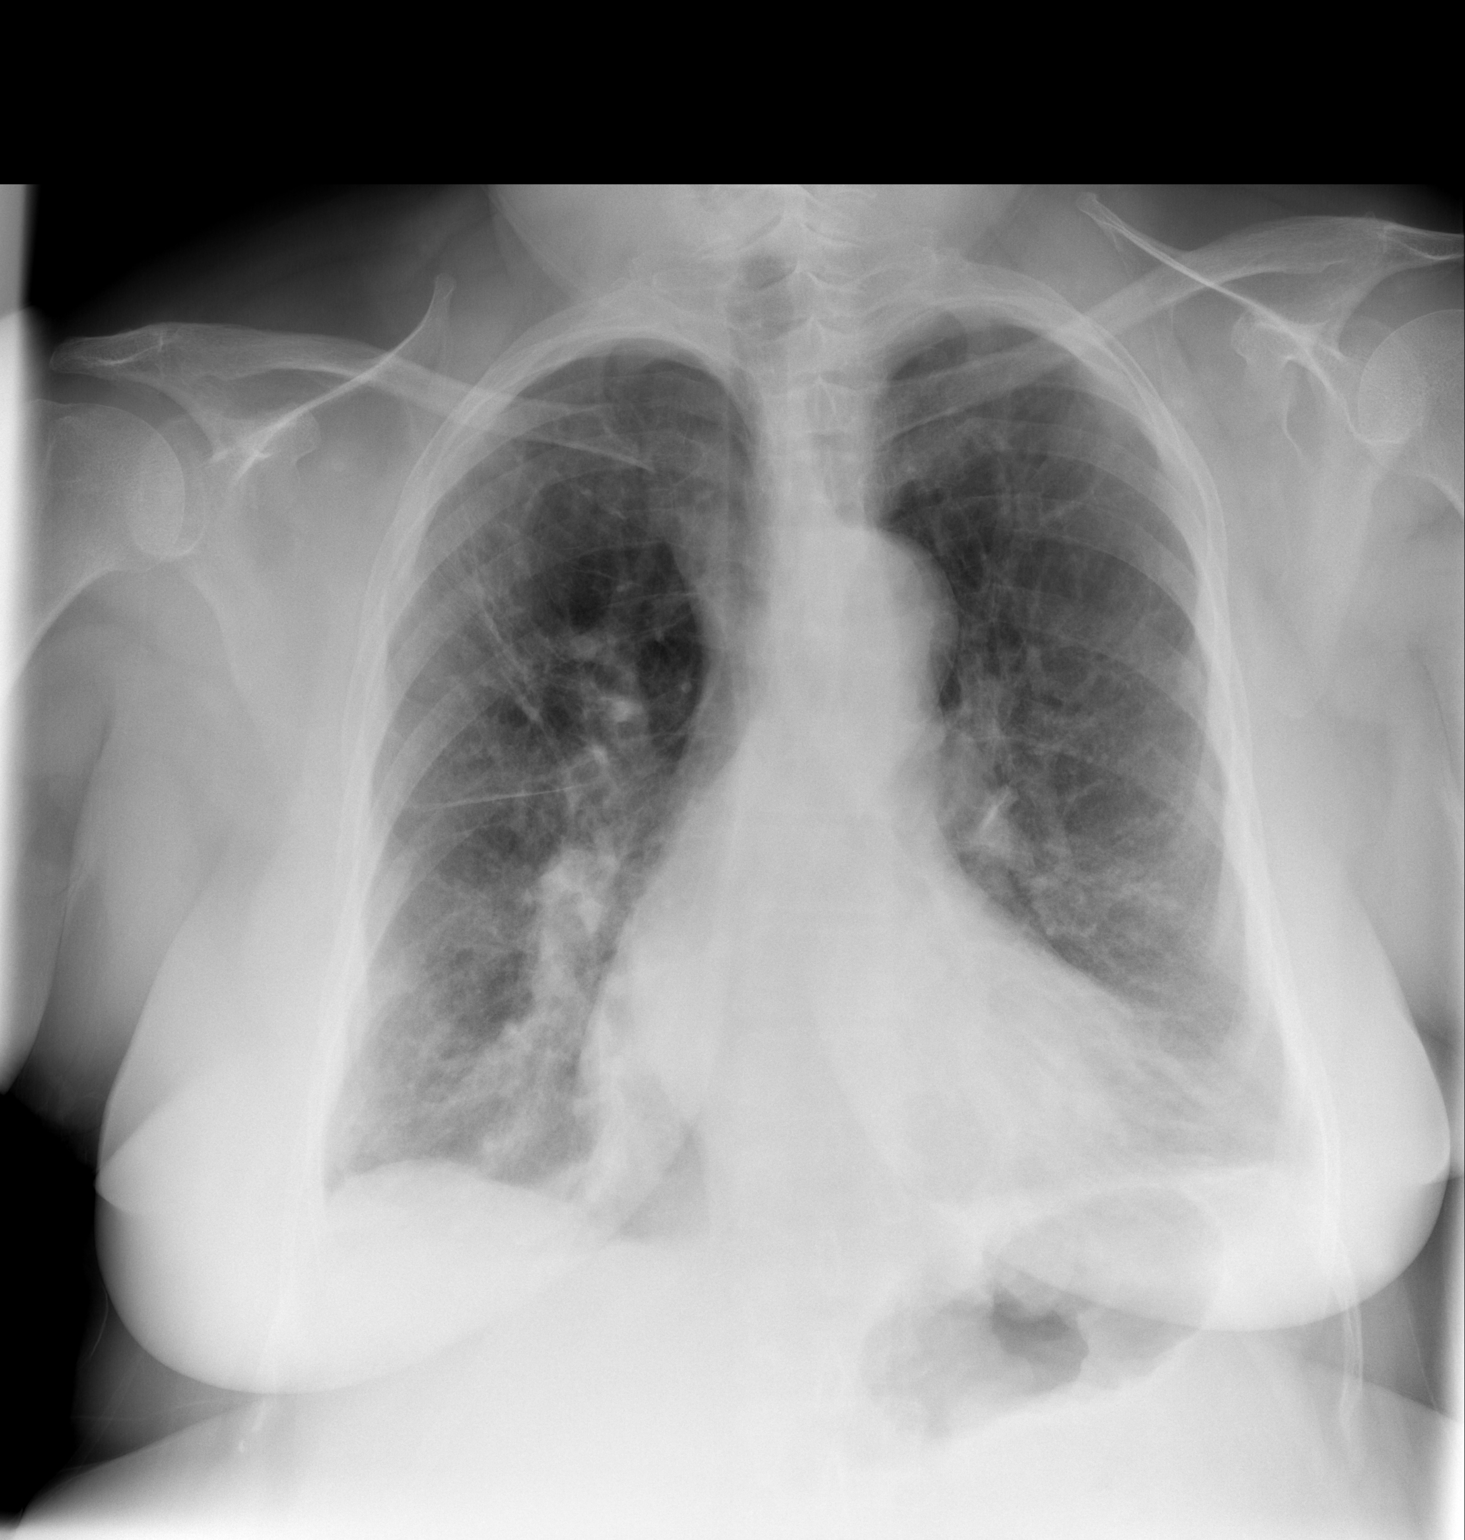

[w chest lat]
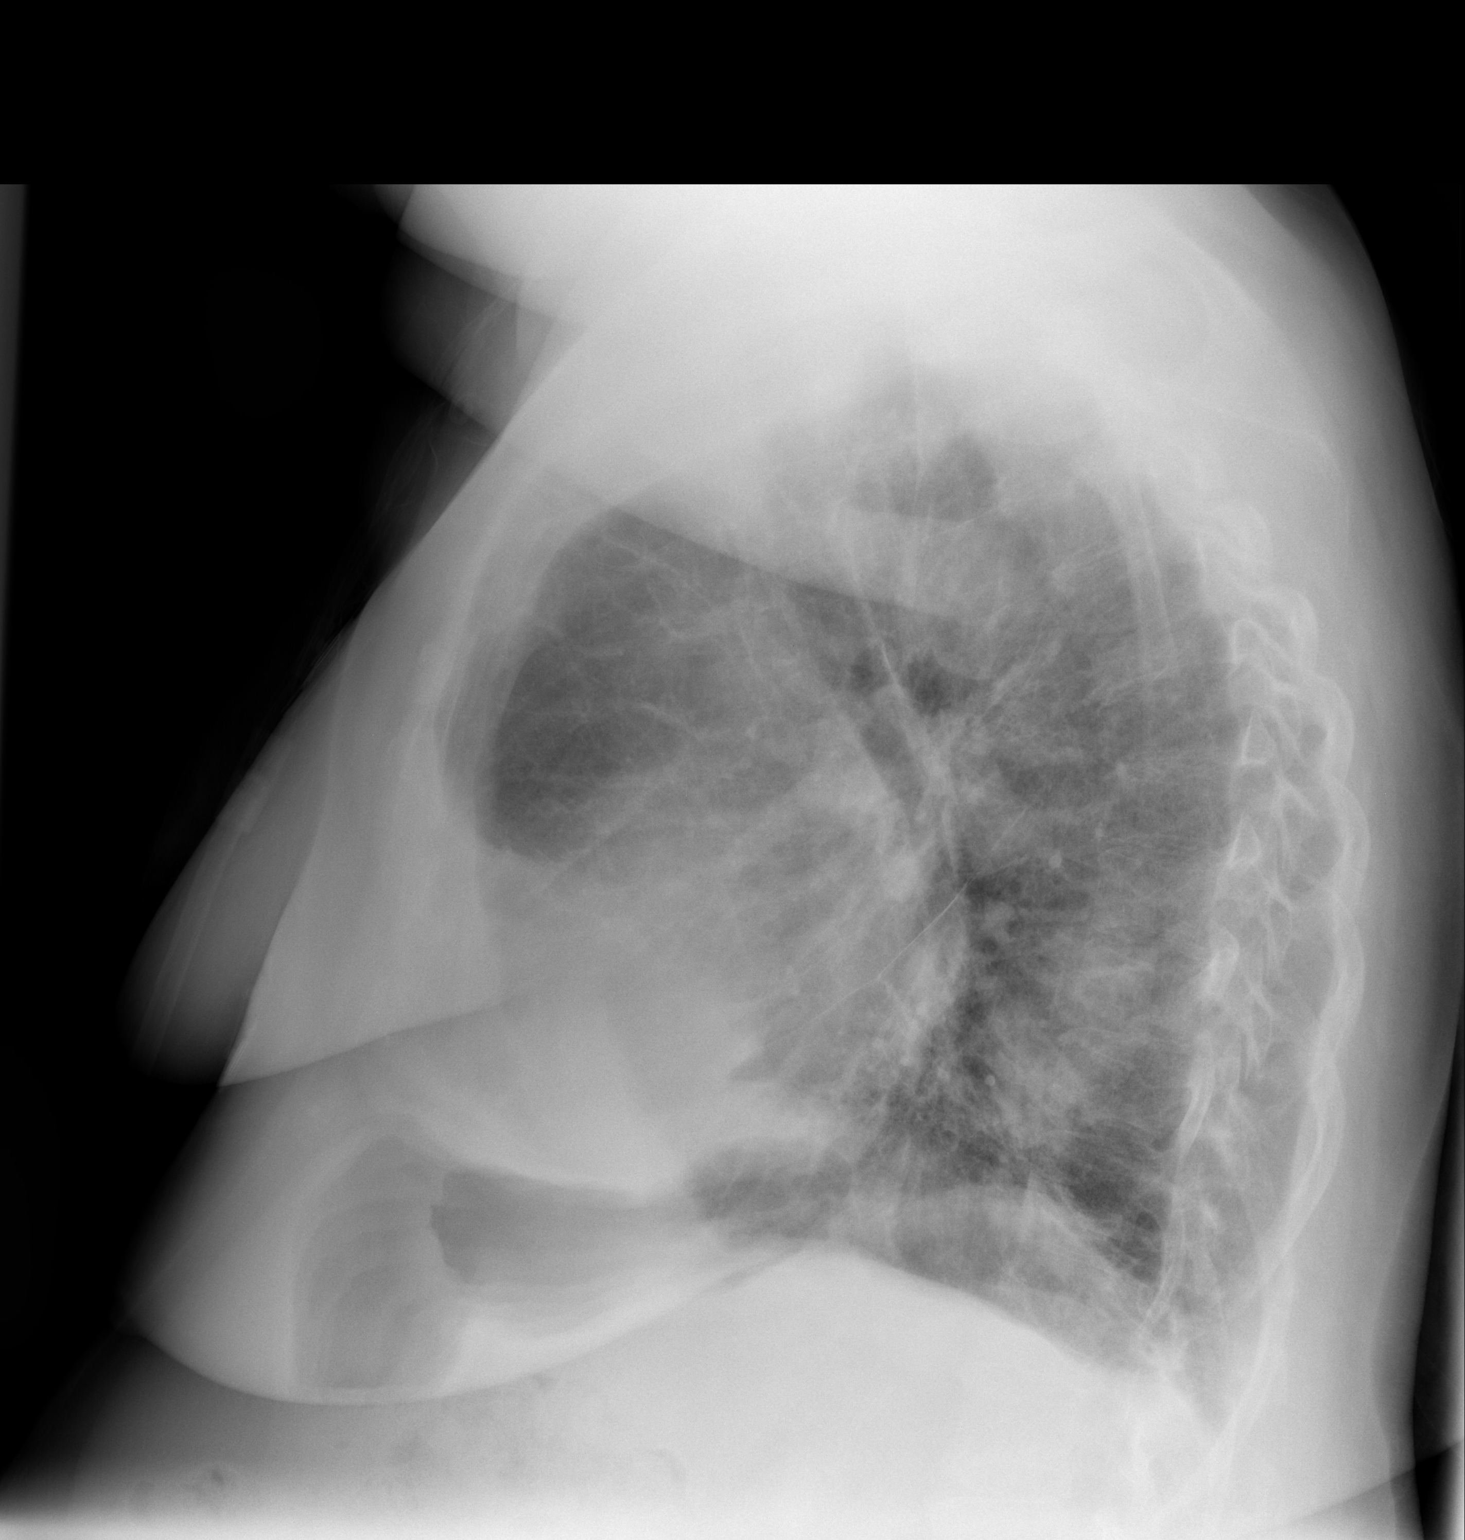

[2 of 2 positions shown; findings below may reference images not displayed]

FINDINGS: The heart size and mediastinal contours are stable. There is
scarring of the right upper lobe and mild scarring or atelectasis of
bilateral lung bases unchanged. There is minimal left pleural
effusion. There is no focal pneumonia. The visualized skeletal
structures are stable P
IMPRESSION: Chronic scarring of the right upper lobe unchanged. Mild scarring
versus atelectasis of both lung bases. Minimal left pleural
effusion.

## 2014-08-09 ENCOUNTER — Telehealth: Payer: Self-pay | Admitting: Internal Medicine

## 2014-08-10 ENCOUNTER — Other Ambulatory Visit: Payer: Self-pay | Admitting: Internal Medicine

## 2014-08-10 NOTE — Telephone Encounter (Signed)
Patient just called regarding this medication.  Can you please give patient call at [phone]

## 2014-08-10 NOTE — Telephone Encounter (Signed)
Spoke with patient and she is aware of the dosage decrease.

## 2014-08-10 NOTE — Telephone Encounter (Signed)
Spoke with Dr. Ronnald Ramp. Per Dr. Donneta Romberg last office note, patient should be dosing down off xanax and trying a safer medication. She now takes one tablet by mouth three times daily, number 90. Dr. Ronnald Ramp will approve one tablet by mouth twice daily, number 60 to start the decrease.

## 2014-08-15 ENCOUNTER — Other Ambulatory Visit: Payer: Self-pay | Admitting: Internal Medicine

## 2014-09-09 ENCOUNTER — Other Ambulatory Visit: Payer: Self-pay

## 2014-09-09 MED ORDER — ALPRAZOLAM 0.5 MG PO TABS: 0.5000 mg | ORAL_TABLET | Freq: Two times a day (BID) | ORAL | Status: DC | PRN

## 2014-09-09 NOTE — Telephone Encounter (Signed)
done

## 2014-09-09 NOTE — Telephone Encounter (Signed)
Strength is the approved decreased dosage from last time. Incoming fax to [facility]. 1 tab PO BID

## 2014-09-19 ENCOUNTER — Telehealth: Payer: Self-pay | Admitting: Internal Medicine

## 2014-09-19 MED ORDER — TIOTROPIUM BROMIDE MONOHYDRATE 18 MCG IN CAPS: ORAL_CAPSULE | RESPIRATORY_TRACT | Status: DC

## 2014-09-19 MED ORDER — MOMETASONE FURO-FORMOTEROL FUM 200-5 MCG/ACT IN AERO: 2.0000 | INHALATION_SPRAY | Freq: Two times a day (BID) | RESPIRATORY_TRACT | Status: DC

## 2014-09-19 NOTE — Telephone Encounter (Signed)
Pt advised that refills for Spiriva and Dulera were sent to [facility].  Advised to keep appt with MR in 09/2014.

## 2014-09-30 ENCOUNTER — Other Ambulatory Visit: Payer: Self-pay | Admitting: Internal Medicine

## 2014-10-11 ENCOUNTER — Other Ambulatory Visit: Payer: Self-pay | Admitting: Internal Medicine

## 2014-10-11 NOTE — Telephone Encounter (Signed)
Faxed script back to [facility].../lmb 

## 2014-10-18 ENCOUNTER — Ambulatory Visit: Payer: Medicare Other | Admitting: Internal Medicine

## 2014-10-18 ENCOUNTER — Other Ambulatory Visit: Payer: Self-pay | Admitting: Internal Medicine

## 2014-10-18 ENCOUNTER — Ambulatory Visit (INDEPENDENT_AMBULATORY_CARE_PROVIDER_SITE_OTHER): Payer: Medicare Other | Admitting: Internal Medicine

## 2014-10-18 ENCOUNTER — Encounter: Payer: Self-pay | Admitting: Internal Medicine

## 2014-10-18 VITALS — BP 136/80 | HR 94 | Ht 64.0 in | Wt 222.0 lb

## 2014-10-18 DIAGNOSIS — Z720 Tobacco use: Secondary | ICD-10-CM

## 2014-10-18 DIAGNOSIS — Z23 Encounter for immunization: Secondary | ICD-10-CM

## 2014-10-18 DIAGNOSIS — R911 Solitary pulmonary nodule: Secondary | ICD-10-CM

## 2014-10-18 DIAGNOSIS — J449 Chronic obstructive pulmonary disease, unspecified: Secondary | ICD-10-CM | POA: Diagnosis not present

## 2014-10-18 DIAGNOSIS — J9611 Chronic respiratory failure with hypoxia: Secondary | ICD-10-CM

## 2014-10-18 NOTE — Progress Notes (Signed)
Subjective:    Patient ID: [name], female    DOB: [birth year], 69 y.o.   MRN: [id number]  HPI     Chief Complaint  Patient presents with  . Hosptial Follow up    D/C on 03/14/13- Increased sob with exertion.  Reports not sleeping well over past two and needs refill today on her medications    OV April 2012: Obese female hospitalized in March 2012 and now following. 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 OVerall doing much better since discharge. Using o2. No insurance. Unable to afford xopenex and pulmicort neb. Able to afford atroven neb. Wanting med change. Dysponea is at baseline class 3. Reportedly cut down on smoking to 1 cig/day. Mild pedal edema improving. Admits to snoring and excess day time somnolence   FEV-1 in 2008 was 63% with a diffusion capacity of 33%   #COPD  - due to cost stop taking pulmicort neb and xopenex neb  - your new regimen is atrovent neb 4 times daily., QVAR inhaler 2 puff bid, albuterol inhaler 2 puff as needed (take samples and show technique)  - use oxygen all 24 hours (we will Hold off on portable system due to cost issues)  # sleep apnea  - you probably have this  - have sleep study and fu with Dr. Elsworth Soho or Dr Halford Chessman  #Smoking  - please quit  #CT lung nodule and node  - we will do CT chest in 6 months  #Followup  - 2 months or sooner if needed  - if new meds are costly please call us soon  - please see $ counsillor soon   OV 11/12/10: Followup a) COPD; b) smoking; c) lung nodule; and d) possible sleep apnea. COPD is stable. Using mdi and nebs. Still smoking.  Wants to do e-cigs and is asking infor about it. Had sleep study in May 2012: AHI 1.8, PLM 27 but denies restless legs. $ issues and did not fu with Dr  Halford Chessman. Says she is finding it tough to pay bills for sleep apnea. Has not had CT chest as fu for nodule; cost issue again. Does not qualify for medicaid. Medicare few years away. Does not have private health care. Goes to [employer]  Past, Family and Social: no change    #sleep study  - you just need to use oxygen with sleep  - you do not need cpap machine  #Smoking  - please quit smoking  - at this point research does not recommend e-cigarette  #COPD  - you use your nebulizers as scheduled  #lung nodule  - you need followup CT chest in next 3-6 months (with contrast)  - meet with my manager to find out cost esp for [facility] patients  #Followup  - 6 months  - flu shot today   OV 06/22/2012  Followup a) COPD; b) smoking; c) lung nodule;  Have not seen her since October 2012. She only now got her Medicare in April 2015 and therefore made this appointment. Overall she says she was stable but now for the past 3 weeks is having increased cough, wheezing, chest tightness, increased white color phlegm. She is not using her medications correctly. She is finding nebulizers to much of a hassle to use. She does use her Qvar but I'm  not sure of her compliance. COPD cat score is 35.  In terms of smoking: She continues to smoke conventional tobacco. The office smells of tobacco. She's not sure she will be interested in quitting  In terms of lung nodule: It has been over 2 years since she had her last CT scan of the chest. She never wanted to have a CT scan of the chest because she had to pay for it out of pocket. But now that she has Medicare she is willing to have the CT scan of the chest  rec #COPD flareup - You are having a COPD flare up right now -  take levaquin 500mg  once daily  X 5 days - Please Please take prednisone 40 mg x1 day, then 30 mg x1 day, then 20 mg x1 day, then 10 mg x1 day, and then 5 mg x1 day and stop  #COPD - Continue Qvar 80 mcg 2 puff 2 times a day - Try ANORO 1  puff once daily  - this medication is a trial only   - learn technique from my CMA  #Lung nodule  = have followup CT chest - 2 year followup  #Smoking  - please quit  #Followup  - full PFT in 5 weeks - return after ct chest in 5 weeks   06/30/2012 f/u ov/Wert still smoking Chief Complaint  Patient presents with  . Acute Visit    Pt states breathing is no better since visit here on 06/22/12. Still having alot of SOB and prod cough and mucus gets hung in her throat.   not using 02 daytime x after gets tired and sits down, has only tried the neb x one and it did help. Mucus is clear >>rx Dulera, stopped QVAR and Anoro    CT chest >previously seen right lung nodules are no longer  demonstrated. The third nodule is stable. Progressive mediastinal adenopathy.  Changes of COPD and chronic bronchitis  07/16/2012 Acute OV  Complains of increased SOB, and worsening LE edema x2 days Still smoking.  Wearing O2 2 l/m with act and At bedtime   Takes O2 off to smoke. Advised on smoking cessation and dangers of smoking Started on Dulera last ov , Anoro and QVAR were stopped. .  Was not able to start PPI and pepcid , wants an rx.  Wants something for anxiety. Ran out xanax , can not afford to go back to psychiatry.  No chest pain, syncope, fever, cough or n/v.    REC Continue on Dulera 2 puffs Twice daily   Begin Prilosec 20mg  daily before meal  Begin Pepcid 20mg  At bedtime   Xanax 0.5mg  Twice daily  As needed  Anxiety - will need to get refills thru family MD  NO SMOKING- at all especially in home where oxygen tanks/machine is present.  Keep legs elevated.  Take Lasix 20mg  2 tabs daily for 3 days then 1 tab daily  We are setting you up for an ultrasound of your right leg .  Please contact office for sooner follow up if symptoms do not improve or worsen or seek emergency care  follow up Dr. [name] in 2 weeks and As neede  OV 07/28/2012  FU  1) Lung nodule : had 2 year fu CT - see  below. I personally reviewed this scan  Ct chest 06/25/12 IMPRESSION:  1. The previously seen right lung nodules are no longer  demonstrated. The third nodule is stable. This could be further  evaluated  with a repeat chest CT without contrast in 9-12 months.  2. Progressive mediastinal adenopathy. - largest to me looks7mm 3. Changes of COPD and chronic bronchitis.  4. Linear scarring at the left lung base.  5. Diffuse hepatic steatosis.  Original Report Authenticated By: Claudie Revering, M.D   2) COPD - stable diseaes. COPD Cat score is 23 and reflects significant symptoms. Says rehab still has not called her. She is interested. PFTs 78/14 shows gold stage 3 copd with  fev1 1.12L/49%, FVC 2.01L/70% - post bd parameters.  No bd response, Ratio 60/77%. TLC 87%, DLCO 5/21%   3) new RLE pedal edema:   Duplex LE - 07/21/12 - negative. BNP 07/16/12 - 25 and bmet normal. Apparently Dr Melvyn Novas has given her lasix which is helping. She is frustrated she does not know etiology. She has no PCP   Oct 2014 hosp in [place] with chf ? Related to hypoxemia > 3lpm but weaned it to 2lpm   02/16/2013 f/u ov/Wert re: sob/ referred by rehab where she arrived per her usual routine Chief Complaint  Patient presents with  . Acute Visit    Pt c/o increased SOB with any exertion, chest congestion, nonprod cough.  much better on prednisone one week prior to OV  And last dose was 02/12/13 and worse again since. Confused with when/ how to use her various inhalers/ nebs  No obvious day to day or daytime variabilty or assoc   cp or chest tightness, subjective wheeze overt sinus or hb symptoms. No unusual exp hx or h/o childhood pna/ asthma or knowledge of premature birth.  Sleeping ok without nocturnal  or early am exacerbation  of respiratory  c/o's or need for noct saba. Also denies any obvious fluctuation of symptoms with weather or environmental changes or other aggravating or alleviating factors except as outlined  above    OV  05/24/2013  Chief Complaint  Patient presents with  . Hosptial Follow up    D/C on 03/14/13- Increased sob with exertion.  Reports not sleeping well over past two and needs refill today on her medications   Recent hospitalization in for  for COPD exacerbation followed by  inpatient rehabilitation. She feels he did not get adequate rehabilitation at the rehabilitation facility. She is getting home physical therapy. Nevertheless she says has lot of fatigue since discharge but  denies chest pain or orthopnea or edema. She continues to use oxygen. She is frustrated by the level of fatigue. She does acknowledge that pulmonary rehabilitation helped her in the past. She's really keen on going the etiology of her fatigue and getting to the bottom of it. She wants portable o2 system  Last echocardiogram March 2008 was normal with an ejection fraction of 65%  TSH August 2014 was normal  Last CT scan of the chest December 2014: No vomiting was in but had small pulmonary nodules which require followup in 2015 December  BNP February 2015: Is normal   ABG February  2015 did show some mild hypercapnia with a PCO2 of 49  Blood work February 2015 shows hemoglobin 11 g percent and a normal creatinine of 1.09 mg percent with a GFR of 60   VIt D - she denies having had it but lab order gives hard stop saying she has had this tested several times  Dg Chest 2 View  05/24/2013   CLINICAL DATA:  Follow-up pneumonia  EXAM: CHEST  2 VIEW  COMPARISON:  03/14/2013, 01/11/2013, and 08/26/2012  FINDINGS: Chronic  interstitial markings/emphysematous changes. Superimposed scarring/fibrosis in the lateral right upper lobe and lingula. No focal consolidation. No pleural effusion or pneumothorax.  Mild cardiomegaly.  Degenerative changes of the visualized thoracolumbar spine.  IMPRESSION: No evidence of acute cardiopulmonary disease.  Emphysematous changes with superimposed scarring/fibrosis, as above.    Electronically Signed   By: Julian Hy M.D.   On: 05/24/2013 14:39      OV 10/18/2014  Chief Complaint  Patient presents with  . Follow-up    Pt states her breathing is unchanged since last OV.  Pt c/o prod cough in morning with cloudy mucus, pt states the mucus becomes clear throughout the day.    Follow-up  COPD with chronic respiratory failure: It has been over a year since I saw Aronda Jager . At that time she had significant fatigue which is struggled to address. I referred her to pulmonary rehabilitation but apparently she was turned away. She tells me that she was diagnosed with anemia. Review of the results in the computer show hemoglobin was 10 g percent 07/20/2013. After that apparently according to her history she went on iron pills and by July 2016 in October 2015 hemoglobin rose up to 15 g percent and this fatigue resolved. She says because she was feeling well she is not coming here to review her COPD status. Currently here for refills. Feels well as she can be and is in stable health.  Smoking: She continues to smoke. Finds it difficult to quit  Lung nodules: She was supposed to have a CT scan in December 2015 but obviously she did not follow-up for that. She needs a repeat CT scan        reports that she has been smoking Cigarettes.  She has a 22 pack-year smoking history. She has never used smokeless tobacco.    Current outpatient prescriptions:  .  ALPRAZolam (XANAX) 0.5 MG tablet, TAKE 1 TABLET BY MOUTH TWICE A DAY AS NEEDED FOR ANXIETY, Disp: 60 tablet, Rfl: 0 .  carvedilol (COREG) 3.125 MG tablet, Take 1 tablet (3.125 mg total) by mouth 2 (two) times daily with a meal., Disp: 180 tablet, Rfl: 1 .  cloNIDine (CATAPRES) 0.1 MG tablet, TAKE 1 TABLET BY MOUTH TWICE A DAY, Disp: 180 tablet, Rfl: 1 .  famotidine (PEPCID) 20 MG tablet, Take 20 mg by mouth at bedtime., Disp: , Rfl:  .  fluticasone (FLONASE) 50 MCG/ACT nasal spray, Place 2 sprays into both nostrils  daily as needed for allergies or rhinitis., Disp: , Rfl:  .  furosemide (LASIX) 40 MG tablet, TAKE 1 TABLET (40 MG TOTAL) BY MOUTH DAILY., Disp: 90 tablet, Rfl: 1 .  losartan (COZAAR) 25 MG tablet, Take 1 tablet (25 mg total) by mouth daily., Disp: 90 tablet, Rfl: 3 .  mometasone-formoterol (DULERA) 200-5 MCG/ACT AERO, Inhale 2 puffs into the lungs 2 (two) times daily., Disp: 1 Inhaler, Rfl: 0 .  omeprazole (PRILOSEC) 20 MG capsule, Take 20 mg by mouth every morning., Disp: , Rfl:  .  simvastatin (ZOCOR) 10 MG tablet, Take 1 tablet (10 mg total) by mouth at bedtime., Disp: 90 tablet, Rfl: 3 .  tiotropium (SPIRIVA HANDIHALER) 18 MCG inhalation capsule, PLACE 1 CAPSULE INTO INHALER AND INHALE DAILY., Disp: 30 capsule, Rfl: 0 .  VENTOLIN HFA 108 (90 BASE) MCG/ACT inhaler, INHALE 2 PUFFS INTO THE LUNGS EVERY 6 HOURS AS NEEDED., Disp: 18 Inhaler, Rfl: 2  Immunization History  Administered Date(s) Administered  . Influenza Split 11/12/2010  . Influenza, High  Dose Seasonal PF 11/09/2012  . Pneumococcal Conjugate-13 05/17/2013  . Pneumococcal Polysaccharide-23 03/22/2010     Review of Systems  Per History of present illness     Objective:   Physical Exam  Constitutional: She is oriented to person, place, and time. She appears well-developed and well-nourished. No distress.  Body mass index is 38.09 kg/(m^2).   HENT:  Head: Normocephalic and atraumatic.  Right Ear: External ear normal.  Left Ear: External ear normal.  Mouth/Throat: Oropharynx is clear and moist. No oropharyngeal exudate.  Smells of tobacco  Eyes: Conjunctivae and EOM are normal. Pupils are equal, round, and reactive to light. Right eye exhibits no discharge. Left eye exhibits no discharge. No scleral icterus.  Neck: Normal range of motion. Neck supple. No JVD present. No tracheal deviation present. No thyromegaly present.  Cardiovascular: Normal rate, regular rhythm, normal heart sounds and intact distal pulses.  Exam  reveals no gallop and no friction rub.   No murmur heard. Pulmonary/Chest: Effort normal and breath sounds normal. No respiratory distress. She has no wheezes. She has no rales. She exhibits no tenderness.  Abdominal: Soft. Bowel sounds are normal. She exhibits no distension and no mass. There is no tenderness. There is no rebound and no guarding.  Musculoskeletal: Normal range of motion. She exhibits no edema or tenderness.  useswalker  Lymphadenopathy:    She has no cervical adenopathy.  Neurological: She is alert and oriented to person, place, and time. She has normal reflexes. No cranial nerve deficit. She exhibits normal muscle tone. Coordination normal.  Skin: Skin is warm and dry. No rash noted. She is not diaphoretic. No erythema. No pallor.  Psychiatric: She has a normal mood and affect. Her behavior is normal. Judgment and thought content normal.  Vitals reviewed.    Filed Vitals:   10/18/14 1639  BP: 136/80  Pulse: 94  Height: 5\' 4"  (1.626 m)  Weight: 222 lb (100.699 kg)  SpO2: 93%        Assessment & Plan:     ICD-9-CM ICD-10-CM   1. Chronic obstructive pulmonary disease, unspecified COPD, unspecified chronic bronchitis type 496 J44.9 Pulmonary rehab therapeutic exercise  2. Chronic respiratory failure with hypoxia 518.83 J96.11    799.02    3. Tobacco abuse 305.1 Z72.0   4. Pulmonary nodule 793.11 R91.1 Pulmonary rehab therapeutic exercise     #Copd and Chronic Respiratory Failure  -  Stable disease  Plan - Flu shot today - Refer pulmonary rehabilitation rotation -Continue oxygen and Spiriva and Dulera - Talk to your doctor and have your coreg switched to Lopressor (because Coreg can make his COPD worse potentially)  #Pulmonary nodule  - Do CT scan of the chest without contrast for follow-up of lung nodules from December 2014 - We will call you with the results  #Smoking  - You need to quit otherwise her lungs will get worse  #Followup  - 6 months or  sooner if needed    Dr. Brand Males, M.D., [facility].C.P Pulmonary and Critical Care Medicine Staff Physician [facility] Pager: [phone], If no answer or between  15:00h - 7:00h: call [phone]  10/18/2014 5:22 PM

## 2014-10-18 NOTE — Patient Instructions (Addendum)
ICD-9-CM ICD-10-CM   1. Chronic obstructive pulmonary disease, unspecified COPD, unspecified chronic bronchitis type 496 J44.9 Pulmonary rehab therapeutic exercise  2. Chronic respiratory failure with hypoxia 518.83 J96.11    799.02    3. Tobacco abuse 305.1 Z72.0   4. Pulmonary nodule 793.11 R91.1 Pulmonary rehab therapeutic exercise     #Copd and Chronic Respiratory Failure  -  Stable disease  Plan - Flu shot today - Refer pulmonary rehabilitation rotation -Continue oxygen and Spiriva and Dulera - Talk to your doctor and have your coreg switched to Lopressor (because Coreg can make his COPD worse potentially)  #Pulmonary nodule  - Do CT scan of the chest without contrast for follow-up of lung nodules from December 2014 - We will call you with the results  #Smoking  - You need to quit otherwise her lungs will get worse  #Followup  - 6 months or sooner if needed

## 2014-10-20 ENCOUNTER — Encounter: Payer: Self-pay | Admitting: Internal Medicine

## 2014-10-20 DIAGNOSIS — Z23 Encounter for immunization: Secondary | ICD-10-CM | POA: Diagnosis not present

## 2014-10-21 NOTE — Telephone Encounter (Signed)
Sent patient message recommending to discuss at visit.

## 2014-11-07 ENCOUNTER — Other Ambulatory Visit: Payer: Self-pay | Admitting: Internal Medicine

## 2014-11-08 ENCOUNTER — Other Ambulatory Visit: Payer: Self-pay | Admitting: Internal Medicine

## 2014-11-10 ENCOUNTER — Ambulatory Visit: Payer: Medicare Other | Admitting: Internal Medicine

## 2014-11-17 ENCOUNTER — Telehealth (HOSPITAL_COMMUNITY): Payer: Self-pay

## 2014-11-17 NOTE — Telephone Encounter (Signed)
I have called and left a message with [facility] to inquire about participation in Pulmonary Rehab per Dr. Golden Pop referral. Will send letter in mail and follow up.

## 2014-12-02 ENCOUNTER — Other Ambulatory Visit: Payer: Self-pay | Admitting: Internal Medicine

## 2014-12-05 ENCOUNTER — Ambulatory Visit (HOSPITAL_COMMUNITY): Payer: Medicare Other

## 2014-12-06 ENCOUNTER — Other Ambulatory Visit: Payer: Self-pay | Admitting: Internal Medicine

## 2014-12-06 NOTE — Telephone Encounter (Signed)
Ok to refill 

## 2014-12-07 ENCOUNTER — Other Ambulatory Visit: Payer: Self-pay

## 2014-12-07 MED ORDER — ALPRAZOLAM 0.5 MG PO TABS: ORAL_TABLET | ORAL | Status: DC

## 2014-12-07 NOTE — Telephone Encounter (Signed)
Done

## 2014-12-12 ENCOUNTER — Ambulatory Visit (HOSPITAL_COMMUNITY): Payer: Medicare Other

## 2014-12-13 ENCOUNTER — Telehealth: Payer: Self-pay | Admitting: Internal Medicine

## 2014-12-13 MED ORDER — PREDNISONE 10 MG PO TABS: ORAL_TABLET | ORAL | Status: DC

## 2014-12-13 MED ORDER — DOXYCYCLINE HYCLATE 100 MG PO TABS: 100.0000 mg | ORAL_TABLET | Freq: Two times a day (BID) | ORAL | Status: DC

## 2014-12-13 NOTE — Telephone Encounter (Signed)
Spoke with pt. Reports SOB, coughing with production of cloudy mucus. Denies chest tightness, wheezing or fever. Onset was 2 days ago. Would like to have an antibiotic and prednisone sent in.  MR - please advise. Thanks.

## 2014-12-13 NOTE — Telephone Encounter (Signed)
Allergies  Allergen Reactions  . Ambien [Zolpidem Tartrate] Other (See Comments)    Causes nightmares    Give her Take doxycycline 100mg  po twice daily x 5 days; take after meals and avoid sunlight  Please take prednisone 40 mg x1 day, then 30 mg x1 day, then 20 mg x1 day, then 10 mg x1 day, and then 5 mg x1 day and stop  If worse go to ER

## 2014-12-13 NOTE — Telephone Encounter (Signed)
Patient notified of MR's recommendations. Rx sent to pharmacy. Nothing further needed. Closing encounter

## 2014-12-17 ENCOUNTER — Other Ambulatory Visit: Payer: Self-pay | Admitting: Internal Medicine

## 2015-01-03 ENCOUNTER — Ambulatory Visit: Payer: Medicare Other | Admitting: Internal Medicine

## 2015-01-08 ENCOUNTER — Other Ambulatory Visit: Payer: Self-pay | Admitting: Internal Medicine

## 2015-01-09 ENCOUNTER — Other Ambulatory Visit: Payer: Self-pay

## 2015-01-09 ENCOUNTER — Telehealth: Payer: Self-pay

## 2015-01-09 MED ORDER — ALPRAZOLAM 0.5 MG PO TABS: 0.5000 mg | ORAL_TABLET | Freq: Two times a day (BID) | ORAL | Status: DC | PRN

## 2015-01-09 NOTE — Addendum Note (Signed)
Addended by: Karle Barr on: 01/09/2015 08:54 AM   Modules accepted: Orders

## 2015-01-09 NOTE — Telephone Encounter (Signed)
OK to fill this prescription with additional refills x0 Sch ov w/her dr Lujean Rave you!

## 2015-01-09 NOTE — Telephone Encounter (Signed)
Patient would like a refill on Xanax please advise.

## 2015-01-09 NOTE — Telephone Encounter (Signed)
Faxed to pharmacy

## 2015-01-10 MED ORDER — ALPRAZOLAM 0.5 MG PO TABS: ORAL_TABLET | ORAL | Status: DC

## 2015-01-10 NOTE — Telephone Encounter (Signed)
Went ahead and refilled Xanax per Dr. Camila Li. However patient has to establish with new PCP for her to get any further refills. Patient has an appointment with Dr. Quay Burow on 01/20/2015.

## 2015-01-10 NOTE — Addendum Note (Signed)
Addended by: Della Goo C on: 01/10/2015 08:13 AM   Modules accepted: Orders

## 2015-01-19 ENCOUNTER — Encounter: Payer: Self-pay | Admitting: *Deleted

## 2015-01-20 ENCOUNTER — Encounter: Payer: Self-pay | Admitting: Internal Medicine

## 2015-01-20 ENCOUNTER — Ambulatory Visit (INDEPENDENT_AMBULATORY_CARE_PROVIDER_SITE_OTHER): Payer: Medicare Other | Admitting: Internal Medicine

## 2015-01-20 ENCOUNTER — Other Ambulatory Visit (INDEPENDENT_AMBULATORY_CARE_PROVIDER_SITE_OTHER): Payer: Medicare Other

## 2015-01-20 VITALS — BP 108/72 | HR 89 | Temp 97.6°F | Resp 18 | Wt 224.0 lb

## 2015-01-20 DIAGNOSIS — D6489 Other specified anemias: Secondary | ICD-10-CM | POA: Diagnosis not present

## 2015-01-20 DIAGNOSIS — J449 Chronic obstructive pulmonary disease, unspecified: Secondary | ICD-10-CM

## 2015-01-20 DIAGNOSIS — R5383 Other fatigue: Secondary | ICD-10-CM

## 2015-01-20 DIAGNOSIS — R739 Hyperglycemia, unspecified: Secondary | ICD-10-CM

## 2015-01-20 DIAGNOSIS — I10 Essential (primary) hypertension: Secondary | ICD-10-CM

## 2015-01-20 DIAGNOSIS — I5032 Chronic diastolic (congestive) heart failure: Secondary | ICD-10-CM

## 2015-01-20 DIAGNOSIS — R7303 Prediabetes: Secondary | ICD-10-CM

## 2015-01-20 DIAGNOSIS — E785 Hyperlipidemia, unspecified: Secondary | ICD-10-CM

## 2015-01-20 LAB — CBC WITH DIFFERENTIAL/PLATELET
Basophils Absolute: 0 10*3/uL (ref 0.0–0.1)
Basophils Relative: 0.2 % (ref 0.0–3.0)
EOS PCT: 1.6 % (ref 0.0–5.0)
Eosinophils Absolute: 0.1 10*3/uL (ref 0.0–0.7)
HCT: 46 % (ref 36.0–46.0)
Hemoglobin: 15 g/dL (ref 12.0–15.0)
LYMPHS ABS: 2.1 10*3/uL (ref 0.7–4.0)
Lymphocytes Relative: 30.4 % (ref 12.0–46.0)
MCHC: 32.5 g/dL (ref 30.0–36.0)
MCV: 87.7 fl (ref 78.0–100.0)
MONO ABS: 0.8 10*3/uL (ref 0.1–1.0)
MONOS PCT: 12.2 % — AB (ref 3.0–12.0)
NEUTROS ABS: 3.8 10*3/uL (ref 1.4–7.7)
NEUTROS PCT: 55.6 % (ref 43.0–77.0)
PLATELETS: 327 10*3/uL (ref 150.0–400.0)
RBC: 5.24 Mil/uL — AB (ref 3.87–5.11)
RDW: 14.1 % (ref 11.5–15.5)
WBC: 6.8 10*3/uL (ref 4.0–10.5)

## 2015-01-20 LAB — COMPREHENSIVE METABOLIC PANEL
ALT: 30 U/L (ref 0–35)
AST: 35 U/L (ref 0–37)
Albumin: 4 g/dL (ref 3.5–5.2)
Alkaline Phosphatase: 94 U/L (ref 39–117)
BUN: 8 mg/dL (ref 6–23)
CHLORIDE: 105 meq/L (ref 96–112)
CO2: 28 meq/L (ref 19–32)
Calcium: 9.6 mg/dL (ref 8.4–10.5)
Creatinine, Ser: 0.86 mg/dL (ref 0.40–1.20)
GFR: 84.46 mL/min (ref >60.00)
GLUCOSE: 87 mg/dL (ref 70–99)
POTASSIUM: 3.7 meq/L (ref 3.5–5.1)
SODIUM: 143 meq/L (ref 135–145)
TOTAL PROTEIN: 7.7 g/dL (ref 6.0–8.3)
Total Bilirubin: 0.2 mg/dL (ref 0.2–1.2)

## 2015-01-20 LAB — LIPID PANEL
CHOL/HDL RATIO: 5
Cholesterol: 172 mg/dL (ref 0–200)
HDL: 34.3 mg/dL — AB (ref >39.00)
LDL CALC: 105 mg/dL — AB (ref 0–99)
NONHDL: 137.83
TRIGLYCERIDES: 165 mg/dL — AB (ref 0.0–149.0)
VLDL: 33 mg/dL (ref 0.0–40.0)

## 2015-01-20 LAB — FERRITIN: FERRITIN: 45.9 ng/mL (ref 10.0–291.0)

## 2015-01-20 LAB — HEMOGLOBIN A1C: Hgb A1c MFr Bld: 6.9 % — ABNORMAL HIGH (ref 4.6–6.5)

## 2015-01-20 LAB — IRON: Iron: 51 ug/dL (ref 42–145)

## 2015-01-20 LAB — TSH: TSH: 3.88 u[IU]/mL (ref 0.35–4.50)

## 2015-01-20 MED ORDER — METOPROLOL TARTRATE 25 MG PO TABS: 12.5000 mg | ORAL_TABLET | Freq: Two times a day (BID) | ORAL | Status: DC

## 2015-01-20 NOTE — Patient Instructions (Addendum)
Call your insurance and see if they will cover cologuard (colon cancer screening).    We have reviewed your prior records including labs and tests today.  Test(s) ordered today. Your results will be released to [facility] (or called to you) after review, usually within 72hours after test completion. If any changes need to be made, you will be notified at that same time.  All other Health Maintenance issues reviewed.   All recommended immunizations and age-appropriate screenings are up-to-date.  No immunizations administered today.   Medications reviewed and updated.  No changes recommended at this time.  Your prescription(s) have been submitted to your pharmacy. Please take as directed and contact our office if you believe you are having problem(s) with the medication(s).  Please schedule followup in 6 months

## 2015-01-20 NOTE — Progress Notes (Signed)
Pre visit review using our clinic review tool, if applicable. No additional management support is needed unless otherwise documented below in the visit note. 

## 2015-01-20 NOTE — Progress Notes (Signed)
Subjective:    Patient ID: [name], female    DOB: [birth year], 69 y.o.   MRN: [id number]  HPI She is here to establish with a new pcp.   COPD:  She is following with pulmonary.  She is on oxygen.  She is still smoking ( 4 cig/day and uses vapor), but has cut down.  She has chronic SOB with walking short distances.  She has a chronic cough, but denies wheezing.  She is trying to quit smoking.   Fatigue:  For the past month she has had increased fatigue.  She had it last year and was anemic.  She started iron and felt better ( was taking three 325 mg of iron daily).  She was told to stop the iron and over the past month has had increased fatigue.  Sometimes her heart rate is in the 120's and she will feel SOB.  She was taking xanax three times a day and it was switched to two times a day and she wonders if that is causing some of the fatigue or SOB.    Hypertension: She is taking her medication daily. She is compliant with a low sodium diet.  She denies chest pain, lightheadedness and regular headaches. She is not exercising regularly due to COPD.    Spot on left leg:  It has been there one year.  It sometimes itches.  It does not hurt.  It has not gotten bigger.  It was originally from a mosquito bite.  She has been putting cream on it to keep it moist.    She is having more difficulty doing things for herself.  Mobile meals brings her lunch daily, he son does some cooking.  Medications and allergies reviewed with patient and updated if appropriate.  Patient Active Problem List   Diagnosis Date Noted  . COLD (chronic obstructive lung disease) ([place]) 10/18/2014  . Anemia   . Generalized anxiety disorder 03/15/2013  . Obesity, unspecified 03/15/2013  . Chronic diastolic heart failure ([place]) 03/14/2013  . Chronic respiratory failure ([place]) 06/30/2012  . COPD (chronic obstructive pulmonary disease) ([place])   . Tobacco abuse   . Pulmonary nodule 03/22/2010  . 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 Bronchiectasis 03/22/2010  . Hyperlipidemia 06/13/2008  . Essential hypertension 03/06/2007  . ADJUSTMENT DISORDER WITH ANXIETY 02/20/2007  . MITRAL VALVE PROLAPSE 02/20/2007  . CERVICAL CANCER, HX OF 02/20/2007    Current Outpatient Prescriptions on File Prior to Visit  Medication Sig Dispense Refill  . albuterol (PROVENTIL) (2.5 MG/3ML) 0.083% nebulizer solution USE 3 ML'S IN NEBULIZER EVERY 6 HOURS AS NEEDED FOR WHEEZING 100 vial 2  . ALPRAZolam (XANAX) 0.5 MG tablet TAKE 1 TABLET BY MOUTH TWICE A DAY AS NEEDED FOR ANXIETY. Patient must establish with new PCP for further refills. 60 tablet 0  . cloNIDine (CATAPRES) 0.1 MG tablet Take 1 tablet (0.1 mg total) by mouth 2 (two) times daily. Est with new PCP for additional refills. 180 tablet 0  . DULERA 200-5 MCG/ACT AERO INHALE 2 PUFFS INTO THE LUNGS 2 TIMES DAILY. 1 Inhaler 6  . famotidine (PEPCID) 20 MG tablet Take 20 mg by mouth at bedtime.    . fluticasone (FLONASE) 50 MCG/ACT nasal spray Place 2 sprays into both nostrils daily as needed for allergies or rhinitis.    . furosemide (LASIX) 40 MG tablet TAKE 1 TABLET (40 MG TOTAL) BY MOUTH DAILY. 90 tablet 1  . losartan (COZAAR) 25 MG tablet Take 1 tablet (  25 mg total) by mouth daily. 90 tablet 3  . omeprazole (PRILOSEC) 20 MG capsule Take 1 capsule (20 mg total) by mouth daily. Est with new PCP for additional refills. 90 capsule 0  . simvastatin (ZOCOR) 10 MG tablet Take 1 tablet (10 mg total) by mouth at bedtime. Patient will need to est with new PCP for additional refills. 90 tablet 0  . SPIRIVA HANDIHALER 18 MCG inhalation capsule PLACE 1 CAPSULE INTO HANDIHALER AND INHALE EVERY DAY 30 capsule 3  . VENTOLIN HFA 108 (90 BASE) MCG/ACT inhaler INHALE 2 PUFFS INTO THE LUNGS EVERY 6 HOURS AS NEEDED 18 Inhaler 2   No current facility-administered medications on file prior to visit.    Past Medical History  Diagnosis Date  . Hypertension   . COPD (chronic obstructive  pulmonary disease) ([place])      FEV-1 in 2008 was 63% with a diffusion capacity of 33%.   . Tobacco abuse      Smokes one pack a day since age 37.  Marland Kitchen History of cervical cancer 1982  . Pulmonary nodule march 2012    59mm RUL and RLL 1st seen march 2012 CT, ?progression 12/2012 CT  . 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 Bronchiectasis march 2012    On CT chest. RUL. Mild  . Medical non-compliance   . Chicken pox   . Seasonal allergies   . Arrhythmia   . Hyperlipidemia   . Tobacco abuse   . Chronic diastolic heart failure ([place])   . Chronic respiratory failure ([place])     Followed in Pulmonary clinic/ [facility]/ Ramaswamy  - 06/30/2012 desat to 86%  RA walking 50 ft, recovered to 90% at rest - 06/30/2012  Walked 1lpm x 3 laps @ 185 ft each stopped due to  Sob, no desat  rec 02 2lpm with activity and sleeping, ok at rest   . Generalized anxiety disorder   . Leg swelling     Venous doppler right 07/21/12 >>Neg    . MITRAL VALVE PROLAPSE   . Obesity, unspecified   . CHF (congestive heart failure) [facility])     Past Surgical History  Procedure Laterality Date  . Total abdominal hysterectomy      Social History   Social History  . Marital Status: Married    Spouse Name: N/A  . Number of Children: N/A  . 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 Smoking status: Current Every Day Smoker -- 0.50 packs/day for 44 years    Types: Cigarettes  . Smokeless tobacco: Never Used     Comment: down to 6 cigs per day, wants to do e cigs; I cautioned against that  . Alcohol Use: Yes     Comment: occasional  . Drug Use: No  . Sexual Activity: Not Asked   Other Topics Concern  . None   Social History Narrative   Married but recently separated from husband    Review of Systems  Constitutional: Positive for fatigue. Negative for fever and chills.  Respiratory: Positive for cough  (in morning and night only, productive in morning and night only) and shortness of breath. Negative for wheezing.   Cardiovascular: Positive for palpitations and leg swelling (occ right leg). Negative for chest pain.  Neurological: Negative for dizziness, light-headedness and headaches.  Objective:   Filed Vitals:   01/20/15 1413  BP: 108/72  Pulse: 89  Temp: 97.6 F (36.4 C)  Resp: 18   Filed Weights   01/20/15 1413  Weight: 224 lb (101.606 kg)   Body mass index is 38.43 kg/(m^2).   Physical Exam  Constitutional: She appears well-developed and well-nourished. No distress.  Cardiovascular: Normal rate and regular rhythm.   Very faint sounding - ? murmur  Musculoskeletal: She exhibits no edema.  Skin: Skin is warm and dry. She is not diaphoretic.  Hyperpigmented area on left lateral leg, size of dime  Psychiatric: She has a normal mood and affect. Her behavior is normal.       Assessment & Plan:   If the spot on her leg dose not improve or gets larger she will go see a dermatologist.   See Problem List.

## 2015-01-22 NOTE — Assessment & Plan Note (Signed)
History of hearing deficiency anemia Check CBC and iron levels Iron deficiency may be contributing to fatigue-may need to supplement again, a lower dose

## 2015-01-22 NOTE — Assessment & Plan Note (Signed)
CVD respiratory failure Following with pulmonary On oxygen continuously Using nebulizer treatments and inhalers as prescribed

## 2015-01-22 NOTE — Assessment & Plan Note (Signed)
Will check blood work Has been iron deficient in the past and iron supplementation improved fatigue

## 2015-01-22 NOTE — Assessment & Plan Note (Signed)
A1c and prediabetic range Check A1c

## 2015-01-22 NOTE — Assessment & Plan Note (Signed)
Check lipid panel Taking simvastatin 10 mg daily

## 2015-01-22 NOTE — Assessment & Plan Note (Signed)
BP Readings from Last 3 Encounters:  01/20/15 108/72  10/18/14 136/80  07/26/14 108/64   Controlled  Continue current medication

## 2015-01-23 ENCOUNTER — Encounter: Payer: Self-pay | Admitting: Internal Medicine

## 2015-01-23 DIAGNOSIS — E119 Type 2 diabetes mellitus without complications: Secondary | ICD-10-CM | POA: Insufficient documentation

## 2015-01-25 MED ORDER — FLUTICASONE PROPIONATE 50 MCG/ACT NA SUSP: 2.0000 | Freq: Every day | NASAL | Status: DC | PRN

## 2015-01-25 MED ORDER — METOPROLOL TARTRATE 25 MG PO TABS: 12.5000 mg | ORAL_TABLET | Freq: Two times a day (BID) | ORAL | Status: DC

## 2015-01-25 MED ORDER — SIMVASTATIN 20 MG PO TABS: 20.0000 mg | ORAL_TABLET | Freq: Every day | ORAL | Status: DC

## 2015-01-25 NOTE — Addendum Note (Signed)
Addended by: Binnie Rail on: 01/25/2015 08:51 PM   Modules accepted: Orders

## 2015-01-27 ENCOUNTER — Encounter (HOSPITAL_COMMUNITY): Payer: Self-pay

## 2015-01-27 ENCOUNTER — Encounter (HOSPITAL_COMMUNITY)
Admission: RE | Admit: 2015-01-27 | Discharge: 2015-01-27 | Disposition: A | Discharge status: Home / Self Care | Payer: Medicare Other | Source: Ambulatory Visit | Attending: Internal Medicine | Admitting: Internal Medicine

## 2015-01-27 VITALS — BP 114/74 | HR 90 | Resp 18 | Ht 64.0 in | Wt 224.9 lb

## 2015-01-27 DIAGNOSIS — J439 Emphysema, unspecified: Secondary | ICD-10-CM | POA: Insufficient documentation

## 2015-01-27 DIAGNOSIS — J438 Other emphysema: Secondary | ICD-10-CM

## 2015-01-27 NOTE — Progress Notes (Signed)
[name] 70 y.o. female Pulmonary Rehab Orientation Note Patient arrived today in Cardiac and Pulmonary Rehab for orientation to Pulmonary Rehab. She was transported from the first floor via wheel chair. She does carry portable oxygen. Per pt, she uses oxygen continuously at 3 liters per minute. Color good, skin warm and dry. Patient is oriented to time and place. Patient's medical history, psychosocial health, and medications reviewed. Psychosocial assessment reveals pt lives with their son. She has two other sons, both out of state, but they do talk frequently via social media. Pt is currently unemployed, disabled. Pt hobbies include playing games online, social media, watching TV. She no longer drives and her friends do not offer to take her out on social outings. She has found new online friends through a COPD support group. She credits this support group to her not having a flare up in over a year. She states she has learned a lot about how to manage her symptoms early. She also attributes her wellness to not being around a lot of people. She relies on the [employer] bus to take her to her medical appointments. Pt reports her stress level is low. Areas of stress/anxiety include Health.  Pt does exhibits some signs of depression. Signs of depression include having a lack of interest in doing things and difficulty maintaining sleep. PHQ2/9 score 2/5. Pt shows fair  coping skills with positive outlook . She was offered emotional support and reassurance. Will continue to monitor and evaluate progress toward psychosocial goal(s) of maintaining a positive attitude about her disease process, and making more meaningful connections with old friends who live in the area. Physical assessment reveals heart rate is normal, S1S2 present. Breath sounds clear to auscultation, no wheezes, rales, or rhonchi. Diminished in the bases bilat. Grip strength equal, strong. Distal pulses palpable. Now edema noted. Patient reports she  does take medications as prescribed. Patient states she follows a Regular diet. The patient reports no specific efforts to gain or lose weight.. Patient's weight will be monitored closely. Demonstration and practice of PLB using pulse oximeter. Patient able to return demonstration satisfactorily. Safety and hand hygiene in the exercise area reviewed with patient. Patient voices understanding of the information reviewed. Department expectations discussed with patient and achievable goals were set. The patient shows enthusiasm about attending the program and we look forward to working with this nice lady. The patient is scheduled for a 6 min walk test on Thursday 1/12 at 3:30 and to begin exercise on Tuesday 1/17 in the 1:30 class.   45 minutes was spent on a variety of activities such as assessment of the patient, obtaining baseline data including height, weight, BMI, and grip strength, verifying medical history, allergies, and current medications, and teaching patient strategies for performing tasks with less respiratory effort with emphasis on pursed lip breathing.

## 2015-02-02 ENCOUNTER — Encounter (HOSPITAL_COMMUNITY)
Admission: RE | Admit: 2015-02-02 | Discharge: 2015-02-02 | Disposition: A | Discharge status: Home / Self Care | Payer: Medicare Other | Source: Ambulatory Visit | Attending: Internal Medicine | Admitting: Internal Medicine

## 2015-02-02 DIAGNOSIS — J439 Emphysema, unspecified: Secondary | ICD-10-CM | POA: Diagnosis not present

## 2015-02-02 NOTE — Progress Notes (Signed)
[name] completed a Six-Minute Walk Test on 02/02/15 . Alaina walked 356 feet with 1 rest break.  The patient's lowest oxygen saturation was 83 %, highest heart rate was 108 bpm , and highest blood pressure was 134/74. The patient was on 4 liters of oxygen with a nasal cannula. Patient stated that her breathing and fatigue in legs hindered their walk test.   Alberteen Sam, MA, ACSM RCEP

## 2015-02-07 ENCOUNTER — Encounter (HOSPITAL_COMMUNITY): Payer: Medicare Other

## 2015-02-09 ENCOUNTER — Encounter (HOSPITAL_COMMUNITY)
Admission: RE | Admit: 2015-02-09 | Discharge: 2015-02-09 | Disposition: A | Discharge status: Home / Self Care | Payer: Medicare Other | Source: Ambulatory Visit | Attending: Internal Medicine | Admitting: Internal Medicine

## 2015-02-09 DIAGNOSIS — J439 Emphysema, unspecified: Secondary | ICD-10-CM | POA: Diagnosis not present

## 2015-02-09 NOTE — Progress Notes (Signed)
Today, Diamon exercised at [facility]. [facility]. Service time was from 1330 to 1500.  The patient exercised by performing aerobic, strengthening, and stretching exercises. Oxygen saturation, heart rate, blood pressure, rate of perceived exertion, and shortness of breath were all monitored before, during, and after exercise. [name] presented with no problems at today's exercise session. She attended pulmonary medication class today.  The patient did not have an increase in workload intensity during today's exercise session.  Pre-exercise vitals: . Weight kg: 101.9 . Liters of O2: 3 . SpO2: 89 . HR: 82 . BP: 98/60 . CBG: NA  Exercise vitals: . Highest heartrate:  89 . Lowest oxygen saturation: 89 . Highest blood pressure: 122/60 . Liters of 02: 3 increased to 4  Post-exercise vitals: . SpO2: 88 . HR: 91 . BP: 108/60 . Liters of O2: 4 . CBG: NA Dr. Rush Farmer, Medical Director Dr. Marily Memos is immediately available during today's Pulmonary Rehab session for Jiovanna Snoke on 02/09/2015  at 1330 class time\ .

## 2015-02-14 ENCOUNTER — Encounter (HOSPITAL_COMMUNITY): Payer: Medicare Other

## 2015-02-16 ENCOUNTER — Other Ambulatory Visit: Payer: Self-pay | Admitting: *Deleted

## 2015-02-16 ENCOUNTER — Encounter (HOSPITAL_COMMUNITY)
Admission: RE | Admit: 2015-02-16 | Discharge: 2015-02-16 | Disposition: A | Discharge status: Home / Self Care | Payer: Medicare Other | Source: Ambulatory Visit | Attending: Internal Medicine | Admitting: Internal Medicine

## 2015-02-16 DIAGNOSIS — J439 Emphysema, unspecified: Secondary | ICD-10-CM | POA: Diagnosis not present

## 2015-02-16 MED ORDER — TIOTROPIUM BROMIDE MONOHYDRATE 18 MCG IN CAPS: ORAL_CAPSULE | RESPIRATORY_TRACT | Status: DC

## 2015-02-16 NOTE — Progress Notes (Signed)
Pt completed Quality of Life survey as a participant in Pulmonary Rehab. Scores 21.0 or below are considered low. Pt score very low in several areas Overall 17.65, Health and Function 9.83, physiological and spiritual 17.14, family 21.25  Patient has concerns with her health and her emphysema.  She hopes that pulmonary rehab will improve her quality of life.Marland Kitchen

## 2015-02-16 NOTE — Progress Notes (Signed)
Today, Susette exercised at [facility]. Service time was from 1330 to 1530.  The patient exercised by performing aerobic, strengthening, and stretching exercises. Oxygen saturation, heart rate, blood pressure, rate of perceived exertion, and shortness of breath were all monitored before, during, and after exercise. Brook presented with no problems at today's exercise session.Patient attended the Oxygen Safety class today.   The patient did not have an increase in workload intensity during today's exercise session.  Pre-exercise vitals: . Weight kg: 102.2 . Liters of O2: 3 . SpO2: 92 . HR: 83 . BP: 102/60 . CBG: na  Exercise vitals: . Highest heartrate:  94 . Lowest oxygen saturation: 84 which increased to 88 with rest and purse lip breathing . Highest blood pressure: 120/64 . Liters of 02: 4  Post-exercise vitals: . SpO2: 92 . HR: 86 . BP: 100/60 . Liters of O2: 4 . CBG: na Dr. Rush Farmer, Medical Director Dr. Sheran Fava is immediately available during today's Pulmonary Rehab session for Amali Royer on 02/16/2015  at 1330 class time.  Marland Kitchen

## 2015-02-17 ENCOUNTER — Telehealth: Payer: Self-pay | Admitting: Internal Medicine

## 2015-02-17 NOTE — Telephone Encounter (Signed)
Pt said that the pharmacy still only has a 30 day supply of this med.  Can you call her or phamarcy?

## 2015-02-17 NOTE — Telephone Encounter (Signed)
Patient is in need of metoprolol as soon as possible.  Patient is requesting 90 day supply.  States pharmacy has been trying to contact.  [facility] uses [facility] [address].

## 2015-02-20 MED ORDER — METOPROLOL TARTRATE 25 MG PO TABS: 12.5000 mg | ORAL_TABLET | Freq: Two times a day (BID) | ORAL | Status: DC

## 2015-02-20 NOTE — Telephone Encounter (Signed)
Sent!

## 2015-02-21 ENCOUNTER — Encounter (HOSPITAL_COMMUNITY)
Admission: RE | Admit: 2015-02-21 | Discharge: 2015-02-21 | Disposition: A | Discharge status: Home / Self Care | Payer: Medicare Other | Source: Ambulatory Visit | Attending: Internal Medicine | Admitting: Internal Medicine

## 2015-02-21 DIAGNOSIS — J439 Emphysema, unspecified: Secondary | ICD-10-CM | POA: Diagnosis not present

## 2015-02-21 NOTE — Progress Notes (Signed)
Today, [name] exercised at [facility]. [facility]. Service time was from 1330 to 1500.  The patient exercised by performing aerobic, strengthening, and stretching exercises. Oxygen saturation, heart rate, blood pressure, rate of perceived exertion, and shortness of breath were all monitored before, during, and after exercise. [name] presented at today's exercise session with more difficulty breathing than at her previous 2 exercise sessions.  She also desaturated to 75% on 4 liters of oxygen with activity, but she would recover with rest and purse lip breathing after 2-3 minutes and saturations would return to 89-90%.  She used her rescue inhaler while in the program and I suggested to do her nebulizer treatments every 4-6 hours as needed at home today and tomorrow and if she is no better tomorrow to call Dr. Golden Pop office to be seen .  She  had no other reportable symptoms, mucus production and color are as normal for [name]. The patient did not have an increase in workload intensity during today's exercise session.  Pre-exercise vitals: . Weight kg: 101.8 . Liters of O2: 3 . SpO2: 92 . HR: 85 . BP: 102/58 . CBG: na  Exercise vitals: . Highest heartrate:  98 . Lowest oxygen saturation: 75 which returned to 90 with rest and purse lip breathing . Highest blood pressure: 100/60 . Liters of 02: 4  Post-exercise vitals: . SpO2: 91 . HR: 81 . BP: 96/62 . Liters of O2: 4 . CBG: na Dr. Rush Farmer, Medical Director Dr. Marily Memos is immediately available during today's Pulmonary Rehab session for [name] on 02/21/2015  at 1330 class time  .

## 2015-02-22 ENCOUNTER — Telehealth: Payer: Self-pay | Admitting: Internal Medicine

## 2015-02-22 DIAGNOSIS — R0902 Hypoxemia: Secondary | ICD-10-CM

## 2015-02-22 DIAGNOSIS — R0600 Dyspnea, unspecified: Secondary | ICD-10-CM

## 2015-02-22 NOTE — Telephone Encounter (Signed)
lmtcb for pt.  

## 2015-02-23 ENCOUNTER — Encounter (HOSPITAL_COMMUNITY): Payer: Medicare Other

## 2015-02-23 NOTE — Telephone Encounter (Signed)
[phone], pt cb

## 2015-02-23 NOTE — Telephone Encounter (Signed)
Several issues  1. Change dulera to symbicort 160 due to formular insurance reasons 2. Worsening dyspnea for aecopd . Give her  Take prednisone 40 mg daily x 2 days, then 20mg  daily x 2 days, then 10mg  daily x 2 days, then 5mg  daily x 2 days and stop Z pak  3. desats into 70s at rehab - have her do 2D echo for dyspnea, and hypoxemia - give first avail to see an APP or me  Thanks  Dr. Brand Males, M.D., [facility]C.P Pulmonary and Critical Care Medicine Staff Physician [facility] Pager: [phone], If no answer or between  15:00h - 7:00h: call [phone]  02/23/2015 9:22 PM

## 2015-02-23 NOTE — Telephone Encounter (Signed)
[phone], pt cb to f/u

## 2015-02-23 NOTE — Telephone Encounter (Signed)
Patient's insurance company will no longer cover Dulera. She reviewed her formulary and said that they have Flovent Diskus and Symbicort 160 on her formulary.  Patient would like to switch to one of these medications that is on her formulary.  Also, Patient has been going back to Pulmonary Rehab.  When she went on Tuesday, her Sats dropped during exercise, went into 70's at one time and she was told that her recovery time is taking too long.  Patient wants to know if Dr. [name] needs to see her.  Dr. [name], please advise.

## 2015-02-23 NOTE — Telephone Encounter (Signed)
Patient calling back, she says that whenever she would have sob spells, Dr. [name] would give her antibiotics and a prednisone taper.  Patient is aware that Dr. [name] is not in the office until tomorrow and says that she is okay with waiting to hear back from him tomorrow.    Dr. [name], please advise.

## 2015-02-24 MED ORDER — PREDNISONE 10 MG PO TABS: ORAL_TABLET | ORAL | Status: DC

## 2015-02-24 MED ORDER — AZITHROMYCIN 250 MG PO TABS: ORAL_TABLET | ORAL | Status: AC

## 2015-02-24 MED ORDER — BUDESONIDE-FORMOTEROL FUMARATE 160-4.5 MCG/ACT IN AERO: 2.0000 | INHALATION_SPRAY | Freq: Two times a day (BID) | RESPIRATORY_TRACT | Status: DC

## 2015-02-24 NOTE — Telephone Encounter (Signed)
Spoke with pt, aware of recs.  meds sent to preferred pharmacy.  Echo ordered.  Pt wishes to schedule appt after echo so the results can be reviewed.  Will hold message in triage until echo is scheduled so rov can be scheduled also.

## 2015-02-27 NOTE — Telephone Encounter (Signed)
appt scheduled.  Nothing further needed.  

## 2015-02-27 NOTE — Telephone Encounter (Signed)
I made pt an appt to follow up with TP on 2/17 when I scheduled her echo.

## 2015-02-28 ENCOUNTER — Encounter (HOSPITAL_COMMUNITY)
Admission: RE | Admit: 2015-02-28 | Discharge: 2015-02-28 | Disposition: A | Discharge status: Home / Self Care | Payer: Medicare Other | Source: Ambulatory Visit | Attending: Internal Medicine | Admitting: Internal Medicine

## 2015-02-28 DIAGNOSIS — J439 Emphysema, unspecified: Secondary | ICD-10-CM | POA: Diagnosis not present

## 2015-02-28 NOTE — Progress Notes (Signed)
Today, [name] exercised at [facility]. [facility]. Service time was from 1330 to 1510.  The patient exercised by performing aerobic, strengthening, and stretching exercises. Oxygen saturation, heart rate, blood pressure, rate of perceived exertion, and shortness of breath were all monitored before, during, and after exercise. [name] presented with again desaurating down to 82% on 4 liters when walking on the track. She did use her rescue inhaler and it did improve her breathing.  After resting and purse lip breathing for 2-3 minutes her saturations did increase to 91-92%.  She is on a steroid dose pack and an antibiotic and is feeling better than her last exercise session.   The patient did not have an increase in workload intensity during today's exercise session.  Pre-exercise vitals: . Weight kg: 102.9 . Liters of O2: 3 . SpO2: 87 . HR: 96 . BP: 100/56 . CBG: na  Exercise vitals: . Highest heartrate:  104 . Lowest oxygen saturation: 82 . Highest blood pressure: 94/60 . Liters of 02: 4  Post-exercise vitals: . SpO2: 90 . HR: 98 . BP: 102/54 . Liters of O2: 4 . CBG: na Dr. Rush Farmer, Medical Director Dr. Marily Memos is immediately available during today's Pulmonary Rehab session for [name] on 02/28/2015  at 1330 class time  .

## 2015-03-02 ENCOUNTER — Encounter (HOSPITAL_COMMUNITY)
Admission: RE | Admit: 2015-03-02 | Discharge: 2015-03-02 | Disposition: A | Discharge status: Home / Self Care | Payer: Medicare Other | Source: Ambulatory Visit | Attending: Internal Medicine | Admitting: Internal Medicine

## 2015-03-02 DIAGNOSIS — J439 Emphysema, unspecified: Secondary | ICD-10-CM | POA: Diagnosis not present

## 2015-03-02 NOTE — Progress Notes (Signed)
Today, [name] exercised at [facility]. [facility]. Service time was from 1330 to 1520.  The patient exercised by performing aerobic, strengthening, and stretching exercises. Oxygen saturation, heart rate, blood pressure, rate of perceived exertion, and shortness of breath were all monitored before, during, and after exercise. [name] presented with no problems at today's exercise session.She attended [employer] oxygen education class today. [name] had nutritional consult during exercise time.  The patient did not have an increase in workload intensity during today's exercise session.  Pre-exercise vitals: . Weight kg: 102.6 . Liters of O2: 3 . SpO2: 89 . HR: 88 . BP: 96/60 . CBG: NA    Post-exercise vitals: . SpO2: 96 . HR: 77 . BP: 122/80 . Liters of O2: 4 . CBG: NA. Dr. Rush Farmer, Medical Director Dr. Waldron Labs is immediately available during today's Pulmonary Rehab session for [name] on 03/02/2015  at 1330 class time.  Marland Kitchen

## 2015-03-02 NOTE — Progress Notes (Signed)
[name] 70 y.o. female Nutrition Note Spoke with pt. There are some ways the pt can make her eating habits healthier. Pt's Rate Your Plate results and ways to improve diet reviewed with pt. Pt tries to avoid many salty foods; pt rinses canned food if she uses it. Pt receives mobile meals daily for lunch. The role of sodium in heart and lung disease reviewed with pt. Pt reports she is pre-diabetic. Foods that effect CBG's discussed. Pt states she is working toward losing wt to help with better blood sugar control. Pt feels being on an 8 day prednisone taper has hindered her wt loss effort. Pt does not check CBG's at this time. Pt c/o difficulty chewing due to poor dentition. Pt is eating soft food to help ease chewing difficulty. Pt reports difficulty buying food. Per discussion, pt receives $16/mo on an EBT card. Pt's son helps pt with grocery shopping/cooking. Pt expressed understanding of the information reviewed.  Lab Results  Component Value Date   HGBA1C 6.9* 01/20/2015   Nutrition Diagnosis ? Food-and nutrition-related knowledge deficit related to lack of exposure to information as related to diagnosis of pulmonary disease ? Obesity related to excessive energy intake as evidenced by a BMI of 38.6 ?  Nutrition Rx/Est. Daily Nutrition Needs for: ? wt loss 1200-1700 Kcal  85-105 gm protein   1500 mg or less sodium      Nutrition Intervention ? Pt's individual nutrition plan and goals reviewed with pt. ? Benefits of adopting healthy eating habits discussed when pt's Rate Your Plate reviewed. ? Pt to attend the Nutrition and Lung Disease class ? Handouts given for DM diet and 5-day menu ideas ? Pt given information for [facility] ? Delora Fuel, RN, aware of pt request for affordable dental consult. ? Continual client-centered nutrition education by RD, as part of interdisciplinary care. Goal(s) 1. Identify food quantities necessary to achieve wt loss of  -2#  per week to a goal wt loss of 2.7-10.9 kg (6-24 lb) at graduation from pulmonary rehab. 2. Pt to be able to name foods that effect blood glucose Monitor and Evaluate progress toward nutrition goal with team.   Derek Mound, M.Ed, RD, LDN, CDE 03/02/2015 3:24 PM

## 2015-03-07 ENCOUNTER — Telehealth (HOSPITAL_COMMUNITY): Payer: Self-pay | Admitting: *Deleted

## 2015-03-07 ENCOUNTER — Encounter (HOSPITAL_COMMUNITY)
Admission: RE | Admit: 2015-03-07 | Discharge: 2015-03-07 | Disposition: A | Discharge status: Home / Self Care | Payer: Medicare Other | Source: Ambulatory Visit | Attending: Internal Medicine | Admitting: Internal Medicine

## 2015-03-07 DIAGNOSIS — J439 Emphysema, unspecified: Secondary | ICD-10-CM | POA: Diagnosis not present

## 2015-03-07 NOTE — Progress Notes (Signed)
Today, Joelene exercised at [facility]. [facility]. Service time was from 1330 to 1510.  The patient exercised by performing aerobic, strengthening, and stretching exercises. Oxygen saturation, heart rate, blood pressure, rate of perceived exertion, and shortness of breath were all monitored before, during, and after exercise. [name] presented with no problems at today's exercise session.  The patient did not have an increase in workload intensity during today's exercise session.  Pre-exercise vitals: . Weight kg: 103.0 . Liters of O2: 3 . SpO2: 92 . HR: 82 . BP: 100/60 . CBG: na  Exercise vitals: . Highest heartrate:  102 . Lowest oxygen saturation:  81 which increased to 90 with purse lip breathing and rest . Highest blood pressure: 120/62 . Liters of 02: 4  Post-exercise vitals: . SpO2: 92 . HR: 85 . BP: 100/64 . Liters of O2: 4 . CBG: na Dr. Rush Farmer, Medical Director Dr. Marily Memos is immediately available during today's Pulmonary Rehab session for Ireoluwa Dorame on 03/07/2015  at 1330 class time  .

## 2015-03-08 ENCOUNTER — Other Ambulatory Visit: Payer: Self-pay

## 2015-03-08 ENCOUNTER — Ambulatory Visit (HOSPITAL_COMMUNITY): Payer: Medicare Other | Attending: Cardiovascular Disease

## 2015-03-08 ENCOUNTER — Other Ambulatory Visit: Payer: Self-pay | Admitting: Internal Medicine

## 2015-03-08 DIAGNOSIS — I10 Essential (primary) hypertension: Secondary | ICD-10-CM | POA: Insufficient documentation

## 2015-03-08 DIAGNOSIS — R0600 Dyspnea, unspecified: Secondary | ICD-10-CM | POA: Diagnosis not present

## 2015-03-08 DIAGNOSIS — R0902 Hypoxemia: Secondary | ICD-10-CM | POA: Insufficient documentation

## 2015-03-08 NOTE — Telephone Encounter (Signed)
MD out of office this week pls advise on refill.../lmb 

## 2015-03-08 NOTE — Telephone Encounter (Signed)
Faxed script back to [facility].../lmb 

## 2015-03-09 ENCOUNTER — Encounter (HOSPITAL_COMMUNITY)
Admission: RE | Admit: 2015-03-09 | Discharge: 2015-03-09 | Disposition: A | Discharge status: Home / Self Care | Payer: Medicare Other | Source: Ambulatory Visit | Attending: Internal Medicine | Admitting: Internal Medicine

## 2015-03-09 DIAGNOSIS — J439 Emphysema, unspecified: Secondary | ICD-10-CM | POA: Diagnosis not present

## 2015-03-09 NOTE — Progress Notes (Signed)
Today, [name] exercised at [facility]. [facility]. Service time was from 1330 to 1515.  The patient exercised by performing aerobic, strengthening, and stretching exercises. Oxygen saturation, heart rate, blood pressure, rate of perceived exertion, and shortness of breath were all monitored before, during, and after exercise. [name] presented with shortness of breath problems at today's exercise session. Her oxygen saturations were lower today, she used her rescue inhaler once and stayed seated for exercise due to her low saturations when walking on the track. Patient attended the Warning Signs and Symptoms class today.   The patient did not have an increase in workload intensity during today's exercise session.  Pre-exercise vitals: . Weight kg: 102.1 . Liters of O2: 3 . SpO2: 82 which increased to 88 with rest and purse lip breathing . HR: 88 . BP: 108/58 . CBG: na  Exercise vitals: . Highest heartrate:  92 . Lowest oxygen saturation: 84 . Highest blood pressure: 142/80 . Liters of 02: 4  Post-exercise vitals: . SpO2: 92 . HR: 81 . BP: 110/64 . Liters of O2: 4 . CBG: na Dr. Rush Farmer, Medical Director [name] is immediately available during today's Pulmonary Rehab session for [name] on 03/09/2015  at 1330 class time  .

## 2015-03-10 ENCOUNTER — Other Ambulatory Visit (INDEPENDENT_AMBULATORY_CARE_PROVIDER_SITE_OTHER): Payer: Medicare Other

## 2015-03-10 ENCOUNTER — Encounter: Payer: Self-pay | Admitting: Adult Health

## 2015-03-10 ENCOUNTER — Ambulatory Visit (INDEPENDENT_AMBULATORY_CARE_PROVIDER_SITE_OTHER): Payer: Medicare Other | Admitting: Adult Health

## 2015-03-10 VITALS — BP 108/66 | HR 75 | Temp 98.4°F | Ht 64.0 in | Wt 223.0 lb

## 2015-03-10 DIAGNOSIS — R0602 Shortness of breath: Secondary | ICD-10-CM

## 2015-03-10 DIAGNOSIS — J9611 Chronic respiratory failure with hypoxia: Secondary | ICD-10-CM | POA: Diagnosis not present

## 2015-03-10 DIAGNOSIS — J441 Chronic obstructive pulmonary disease with (acute) exacerbation: Secondary | ICD-10-CM | POA: Diagnosis not present

## 2015-03-10 DIAGNOSIS — R0902 Hypoxemia: Secondary | ICD-10-CM | POA: Diagnosis not present

## 2015-03-10 LAB — BASIC METABOLIC PANEL
BUN: 6 mg/dL (ref 6–23)
CHLORIDE: 104 meq/L (ref 96–112)
CO2: 32 meq/L (ref 19–32)
CREATININE: 0.94 mg/dL (ref 0.40–1.20)
Calcium: 9.6 mg/dL (ref 8.4–10.5)
GFR: 76.19 mL/min (ref >60.00)
GLUCOSE: 106 mg/dL — AB (ref 70–99)
POTASSIUM: 4.7 meq/L (ref 3.5–5.1)
Sodium: 141 mEq/L (ref 135–145)

## 2015-03-10 LAB — BRAIN NATRIURETIC PEPTIDE: Pro B Natriuretic peptide (BNP): 16 pg/mL (ref 0.0–100.0)

## 2015-03-10 NOTE — Assessment & Plan Note (Signed)
Follw up CT chest

## 2015-03-10 NOTE — Patient Instructions (Signed)
Check labs today  Continue on Symbicort and Spiriva  Work on not smoking  Continue on Oxygen 3l/m rest and 4l/m act  Follow up with Dr. [name] in 2-3 months and As needed   Please contact office for sooner follow up if symptoms do not improve or worsen or seek emergency care

## 2015-03-10 NOTE — Assessment & Plan Note (Signed)
Desats with exercise  Check d dimer  Cont on O2 3 rest and 4l/m act

## 2015-03-10 NOTE — Progress Notes (Signed)
Subjective:    Patient ID: [name], female    DOB: 31-May-1945, 70 y.o.   MRN: [id number]  HPI 70 yo female with COPD ,O2 dependent,   TEST  FEV-1 in 2008 was 63% with a diffusion capacity of 33% PFTs 78/14 shows gold stage 3 copd with fev1 1.12L/49%, FVC 2.01L/70% - post bd parameters. No bd response, Ratio 60/77%. TLC 87%, DLCO 5/21% Duplex LE - 07/21/12 - negative. BNP 07/16/12 - 25   03/10/2015 Follow up : COPD, O2 dependent, Lung nodule  Pt returns for follow up . Had suspected flare 2 weeks ago with increased cough and dyspnea.  She is going to pulmonary rehab. O2 sats have been dropping during exercise.  She was given a steroid taper and abx .  Does feel some better but sill has DOE. Cough is better.  Set up for a 2 D echo on 03/08/15 showed EF 60-65%, Grade 1 DD , PAP mod increased at 71mmHg.  Says she was checked for sleep apnea but was told she did not have sleep apnea. This was >24yrs ago and weight was about 10lbs lighter.  Still smoking , advised on smoking cessation. Has cut back .   CT chest was not done as recommended to follow lung nodule, discussed setting this up soon.   Prevnar and PVX and flu vaccines are utd. .   On Symbicort and Spiriva .   On oxygen 3 l/m rest and 4l/m act .   Denies chest pain, orthopnea , increased edema , calf pain, hemoptysis .    Past Medical History  Diagnosis Date  . Hypertension   . COPD (chronic obstructive pulmonary disease) ([place])      FEV-1 in 2008 was 63% with a diffusion capacity of 33%.   . Tobacco abuse      Smokes one pack a day since age 42.  Marland Kitchen History of cervical cancer 1982  . Pulmonary nodule march 2012    54mm RUL and RLL 1st seen march 2012 CT, ?progression 12/2012 CT  . 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 Bronchiectasis march 2012    On CT chest. RUL. Mild  . Medical non-compliance   . Chicken pox   . Seasonal allergies   . Arrhythmia   . Hyperlipidemia   .  Tobacco abuse   . Chronic diastolic heart failure ([place])   . Chronic respiratory failure ([place])     Followed in Pulmonary clinic/ [facility]/ Ramaswamy  - 06/30/2012 desat to 86%  RA walking 50 ft, recovered to 90% at rest - 06/30/2012  Walked 1lpm x 3 laps @ 185 ft each stopped due to  Sob, no desat  rec 02 2lpm with activity and sleeping, ok at rest   . Generalized anxiety disorder   . Leg swelling     Venous doppler right 07/21/12 >>Neg    . MITRAL VALVE PROLAPSE   . Obesity, unspecified   . CHF (congestive heart failure) ([place])    Current Outpatient Prescriptions on File Prior to Visit  Medication Sig Dispense Refill  . albuterol (PROVENTIL) (2.5 MG/3ML) 0.083% nebulizer solution USE 3 ML'S IN NEBULIZER EVERY 6 HOURS AS NEEDED FOR WHEEZING 100 vial 2  . ALPRAZolam (XANAX) 0.5 MG tablet TAKE 1 TABLET BY MOUTH TWICE A DAY AS NEEDED FOR ANXIETY 60 tablet 0  . budesonide-formoterol (SYMBICORT) 160-4.5 MCG/ACT inhaler Inhale 2 puffs into the lungs 2 (two) times daily.  1 Inhaler 6  . cloNIDine (CATAPRES) 0.1 MG tablet Take 1 tablet (0.1 mg total) by mouth 2 (two) times daily. Est with new PCP for additional refills. 180 tablet 0  . famotidine (PEPCID) 20 MG tablet Take 20 mg by mouth at bedtime.    . fluticasone (FLONASE) 50 MCG/ACT nasal spray Place 2 sprays into both nostrils daily as needed for allergies or rhinitis. 16 g 11  . furosemide (LASIX) 40 MG tablet TAKE 1 TABLET (40 MG TOTAL) BY MOUTH DAILY. 90 tablet 1  . losartan (COZAAR) 25 MG tablet Take 1 tablet (25 mg total) by mouth daily. 90 tablet 3  . metoprolol tartrate (LOPRESSOR) 25 MG tablet Take 0.5 tablets (12.5 mg total) by mouth 2 (two) times daily. 90 tablet 1  . omeprazole (PRILOSEC) 20 MG capsule Take 1 capsule (20 mg total) by mouth daily. Est with new PCP for additional refills. 90 capsule 0  . simvastatin (ZOCOR) 10 MG tablet Take 20 mg by mouth daily at 6 PM.  0  . tiotropium (SPIRIVA HANDIHALER) 18 MCG inhalation capsule  PLACE 1 CAPSULE INTO HANDIHALER AND INHALE EVERY DAY 90 capsule 3  . VENTOLIN HFA 108 (90 BASE) MCG/ACT inhaler INHALE 2 PUFFS INTO THE LUNGS EVERY 6 HOURS AS NEEDED 18 Inhaler 2  . DULERA 200-5 MCG/ACT AERO INHALE 2 PUFFS INTO THE LUNGS 2 TIMES DAILY. (Patient not taking: Reported on 03/10/2015) 1 Inhaler 6   No current facility-administered medications on file prior to visit.     Review of Systems Constitutional:   No  weight loss, night sweats,  Fevers, chills, fatigue, or  lassitude.  HEENT:   No headaches,  Difficulty swallowing,  Tooth/dental problems, or  Sore throat,                No sneezing, itching, ear ache, nasal congestion, post nasal drip,   CV:  No chest pain,  Orthopnea, PND, swelling in lower extremities, anasarca, dizziness, palpitations, syncope.   GI  No heartburn, indigestion, abdominal pain, nausea, vomiting, diarrhea, change in bowel habits, loss of appetite, bloody stools.   Resp: No shortness of breath with exertion or at rest.  No excess mucus, no productive cough,  No non-productive cough,  No coughing up of blood.  No change in color of mucus.  No wheezing.  No chest wall deformity  Skin: no rash or lesions.  GU: no dysuria, change in color of urine, no urgency or frequency.  No flank pain, no hematuria   MS:  No joint pain or swelling.  No decreased range of motion.  No back pain.  Psych:  No change in mood or affect. No depression or anxiety.  No memory loss.         Objective:   Physical Exam  Filed Vitals:   03/10/15 1413  BP: 108/66  Pulse: 75  Temp: 98.4 F (36.9 C)  TempSrc: Oral  Height: 5\' 4"  (1.626 m)  Weight: 223 lb (101.152 kg)  SpO2: 94%    GEN: A/Ox3; pleasant , NAD, well nourished   HEENT:  [place]/AT,  EACs-clear, TMs-wnl, NOSE-clear, THROAT-clear, no lesions, no postnasal drip or exudate noted.   NECK:  Supple w/ fair ROM; no JVD; normal carotid impulses w/o bruits; no thyromegaly or nodules palpated; no  lymphadenopathy.  RESP  Clear  P & A; w/o, wheezes/ rales/ or rhonchi.no accessory muscle use, no dullness to percussion  CARD:  RRR, no m/r/g  , no peripheral edema, pulses intact, no  cyanosis or clubbing.  GI:   Soft & nt; nml bowel sounds; no organomegaly or masses detected.  Musco: Warm bil, no deformities or joint swelling noted.   Neuro: alert, no focal deficits noted.    Skin: Warm, no lesions or rashes       Assessment & Plan:

## 2015-03-10 NOTE — Addendum Note (Signed)
Addended by: Osa Craver on: 03/10/2015 02:45 PM   Modules accepted: Orders

## 2015-03-10 NOTE — Assessment & Plan Note (Signed)
Severe COPD -recent flare now resolving  Echo shows Mod PAH - ? Secondary to severe lung dz.  Previous sleep study neg per pt  Check d Dimer , if positive change to CTA .   PlanCheck labs today  Continue on Symbicort and Spiriva  Work on not smoking  Continue on Oxygen 3l/m rest and 4l/m act  Follow up with Dr. [name] in 2-3 months and As needed   Please contact office for sooner follow up if symptoms do not improve or worsen or seek emergency care

## 2015-03-11 LAB — D-DIMER, QUANTITATIVE (NOT AT ARMC): D DIMER QUANT: 0.31 ug{FEU}/mL (ref 0.00–0.48)

## 2015-03-14 ENCOUNTER — Encounter (HOSPITAL_COMMUNITY)
Admission: RE | Admit: 2015-03-14 | Discharge: 2015-03-14 | Disposition: A | Discharge status: Home / Self Care | Payer: Medicare Other | Source: Ambulatory Visit | Attending: Internal Medicine | Admitting: Internal Medicine

## 2015-03-14 DIAGNOSIS — J439 Emphysema, unspecified: Secondary | ICD-10-CM | POA: Diagnosis not present

## 2015-03-14 NOTE — Progress Notes (Signed)
Today, [name] exercised at [facility]. [facility]. Service time was from 1330 to 1500.  The patient exercised by performing aerobic, strengthening, and stretching exercises. Oxygen saturation, heart rate, blood pressure, rate of perceived exertion, and shortness of breath were all monitored before, during, and after exercise. [name] presented with no problems at today's exercise session.  The patient did  have an increase in workload intensity during today's exercise session.  Pre-exercise vitals: . Weight kg: 101.1 . Liters of O2: 3 . SpO2: 89 . HR: 94 . BP: 100/56 . CBG: NA  Exercise vitals: . Highest heartrate:  101 . Lowest oxygen saturation: 91 . Highest blood pressure: 100/60 . Liters of 02: 4  Post-exercise vitals: . SpO2: 93 . HR: 96 . BP: 124/60 . Liters of O2: 4 . CBG: NA Dr. Rush Farmer, Medical Director Dr. Marily Memos is immediately available during today's Pulmonary Rehab session for [name] on 03/14/2015  at 1330 class time  .

## 2015-03-16 ENCOUNTER — Encounter (HOSPITAL_COMMUNITY)
Admission: RE | Admit: 2015-03-16 | Discharge: 2015-03-16 | Disposition: A | Discharge status: Home / Self Care | Payer: Medicare Other | Source: Ambulatory Visit | Attending: Internal Medicine | Admitting: Internal Medicine

## 2015-03-16 DIAGNOSIS — J439 Emphysema, unspecified: Secondary | ICD-10-CM | POA: Diagnosis not present

## 2015-03-16 NOTE — Progress Notes (Signed)
Today, [name] exercised at [facility]. [facility]. Service time was from 1330 to 1525.  The patient exercised by performing aerobic, strengthening, and stretching exercises. Oxygen saturation, heart rate, blood pressure, rate of perceived exertion, and shortness of breath were all monitored before, during, and after exercise. [name] presented with no problems at today's exercise session.  The patient did not have an increase in workload intensity during today's exercise session.  Pre-exercise vitals: . Weight kg: 101.7 . Liters of O2: 3 . SpO2: 90 . HR: 86 . BP: 120/56 . CBG: NA  Exercise vitals: . Highest heartrate:  95 . Lowest oxygen saturation: 90 . Highest blood pressure: 90/62 . Liters of 02: 4  Post-exercise vitals: . SpO2: 91 . HR: 84 . BP: 118/70 . Liters of O2: 4 . CBG: NA Dr. Rush Farmer, Medical Director Dr. Waldron Labs is immediately available during today's Pulmonary Rehab session for [name] on 03/16/2015  at 1330 class time.  Marland Kitchen

## 2015-03-17 ENCOUNTER — Telehealth: Payer: Self-pay | Admitting: Adult Health

## 2015-03-17 DIAGNOSIS — R911 Solitary pulmonary nodule: Secondary | ICD-10-CM

## 2015-03-17 DIAGNOSIS — J449 Chronic obstructive pulmonary disease, unspecified: Secondary | ICD-10-CM

## 2015-03-17 NOTE — Telephone Encounter (Signed)
Notes Recorded by Melvenia Needles, NP on 03/13/2015 at 9:01 AM D Dimer is neg, low prob for blood clot  BNP -heart failure marker is normal ,  Cont w/ ov recs Will call when CT chest is done .  Please contact office for sooner follow up if symptoms do not improve or worsen or seek emergency care   Called and spoke with pt. Pt states she was told that she would receive a phone call from our office to schedule a CT but has not received any call. I apologized to her, informing her that the CT was not placed. I reassured her that I was placing the CT today. I explained that the [facility] would contact her on Monday 03/20/15 due to the time being close to 5pm today. She voiced understanding and had no further questions. CT has been ordered. Nothing further needed at this time.

## 2015-03-20 ENCOUNTER — Encounter: Payer: Self-pay | Admitting: Internal Medicine

## 2015-03-20 ENCOUNTER — Telehealth: Payer: Self-pay | Admitting: Emergency Medicine

## 2015-03-20 MED ORDER — CLONIDINE HCL 0.1 MG PO TABS: 0.1000 mg | ORAL_TABLET | Freq: Two times a day (BID) | ORAL | Status: DC

## 2015-03-20 MED ORDER — FAMOTIDINE 20 MG PO TABS: 20.0000 mg | ORAL_TABLET | Freq: Every day | ORAL | Status: DC

## 2015-03-20 MED ORDER — OMEPRAZOLE 20 MG PO CPDR: 20.0000 mg | DELAYED_RELEASE_CAPSULE | Freq: Every day | ORAL | Status: DC

## 2015-03-20 MED ORDER — METOPROLOL TARTRATE 25 MG PO TABS: 12.5000 mg | ORAL_TABLET | Freq: Two times a day (BID) | ORAL | Status: DC

## 2015-03-20 MED ORDER — FUROSEMIDE 40 MG PO TABS: ORAL_TABLET | ORAL | Status: DC

## 2015-03-20 MED ORDER — LOSARTAN POTASSIUM 25 MG PO TABS: 25.0000 mg | ORAL_TABLET | Freq: Every day | ORAL | Status: DC

## 2015-03-20 NOTE — Telephone Encounter (Signed)
Refill request from mail order sent in

## 2015-03-21 ENCOUNTER — Encounter (HOSPITAL_COMMUNITY): Payer: Medicare Other

## 2015-03-21 ENCOUNTER — Ambulatory Visit (INDEPENDENT_AMBULATORY_CARE_PROVIDER_SITE_OTHER)
Admission: RE | Admit: 2015-03-21 | Discharge: 2015-03-21 | Disposition: A | Discharge status: Home / Self Care | Payer: Medicare Other | Source: Ambulatory Visit | Attending: Adult Health | Admitting: Adult Health

## 2015-03-21 DIAGNOSIS — J449 Chronic obstructive pulmonary disease, unspecified: Secondary | ICD-10-CM

## 2015-03-21 DIAGNOSIS — R911 Solitary pulmonary nodule: Secondary | ICD-10-CM | POA: Diagnosis not present

## 2015-03-21 DIAGNOSIS — R918 Other nonspecific abnormal finding of lung field: Secondary | ICD-10-CM | POA: Diagnosis not present

## 2015-03-22 ENCOUNTER — Other Ambulatory Visit: Payer: Self-pay | Admitting: Internal Medicine

## 2015-03-22 MED ORDER — BUDESONIDE-FORMOTEROL FUMARATE 160-4.5 MCG/ACT IN AERO: 2.0000 | INHALATION_SPRAY | Freq: Two times a day (BID) | RESPIRATORY_TRACT | Status: DC

## 2015-03-22 NOTE — Telephone Encounter (Signed)
Spoke with pt. She is having trouble with ail order and would like it sent to local pharm.

## 2015-03-22 NOTE — Telephone Encounter (Signed)
This was sent on 03/20/15.

## 2015-03-23 ENCOUNTER — Encounter (HOSPITAL_COMMUNITY): Admission: RE | Admit: 2015-03-23 | Payer: Medicare Other | Source: Ambulatory Visit

## 2015-03-24 ENCOUNTER — Other Ambulatory Visit: Payer: Self-pay | Admitting: Internal Medicine

## 2015-03-27 ENCOUNTER — Other Ambulatory Visit: Payer: Self-pay | Admitting: Adult Health

## 2015-03-27 ENCOUNTER — Telehealth: Payer: Self-pay | Admitting: Adult Health

## 2015-03-27 ENCOUNTER — Encounter: Payer: Self-pay | Admitting: Adult Health

## 2015-03-27 DIAGNOSIS — R599 Enlarged lymph nodes, unspecified: Secondary | ICD-10-CM

## 2015-03-27 NOTE — Telephone Encounter (Signed)
Notes Recorded by Melvenia Needles, NP on 03/24/2015 at 5:15 PM Pulmonary nodules are stable but lymph nodes are enlarged need PET scan to evaluate -ASAP  Ov day after PET scan to discuss results. ---  PET scan scheduled for 03/29/15 at 1 pm. Pt needs appt for 03/30/15. MR do you want to work pt into your scheduled or have pt see TP? thanks

## 2015-03-27 NOTE — Telephone Encounter (Signed)
Patient made appt for PET scan for 03/29/15 and needs to know when she needs an appt to come back in.  CB is [phone].

## 2015-03-27 NOTE — Progress Notes (Signed)
Quick Note:  Scheduled pt for 03/31/15. Nothing further needed ______

## 2015-03-27 NOTE — Progress Notes (Signed)
Quick Note:  Called and spoke with pt. Reviewed results and recs. Pt voiced understanding and had no further questions. Order has been placed. Pt is aware that once PET is scheduled I will call her back to schedule ov. ______

## 2015-03-28 ENCOUNTER — Encounter (HOSPITAL_COMMUNITY)
Admission: RE | Admit: 2015-03-28 | Discharge: 2015-03-28 | Disposition: A | Discharge status: Home / Self Care | Payer: Medicare Other | Source: Ambulatory Visit | Attending: Internal Medicine | Admitting: Internal Medicine

## 2015-03-28 ENCOUNTER — Ambulatory Visit (HOSPITAL_COMMUNITY): Payer: Medicare Other

## 2015-03-28 DIAGNOSIS — J439 Emphysema, unspecified: Secondary | ICD-10-CM | POA: Insufficient documentation

## 2015-03-28 NOTE — Progress Notes (Signed)
Daily Session Note  Patient Details  Name: [name] MRN: [id number] Date of Birth: January 24, 1945 Referring Provider:  Binnie Rail, MD  Encounter Date: 03/28/2015  Check In:     Session Check In - 03/28/15 1549    Check-In   Location [facility]   Staff Present Rosebud Poles, RN, Deland Pretty, MS, ACSM CEP, Exercise Physiologist;Laquinda Moller Rollene Rotunda, RN, BSN   Supervising physician immediately available to respond to emergencies [facility] immediately available   Physician(s) Dr. Marily Memos   Medication changes reported     No   Fall or balance concerns reported    No   Warm-up and Cool-down Performed as group-led instruction   Resistance Training Performed Yes   VAD Patient? No   Pain Assessment   Currently in Pain? No/denies   Multiple Pain Sites No      Capillary Blood Glucose: No results found for this or any previous visit (from the past 24 hour(s)).      Exercise Prescription Changes - 03/28/15 1500    Exercise Review   Progression No   Response to Exercise   Blood Pressure (Admit) 98/62 mmHg   Blood Pressure (Exercise) 90/60 mmHg   Blood Pressure (Exit) 102/60 mmHg   Heart Rate (Admit) 92 bpm   Heart Rate (Exercise) 108 bpm   Heart Rate (Exit) 91 bpm   Oxygen Saturation (Admit) 84 %  increased to 88 with PLB   Oxygen Saturation (Exercise) 86 %   Oxygen Saturation (Exit) 91 %   Rating of Perceived Exertion (Exercise) 15   Perceived Dyspnea (Exercise) 3   Symptoms extreme dyspnea   Duration Progress to 45 minutes of aerobic exercise without signs/symptoms of physical distress   Intensity THRR unchanged  40-80% HRR   Resistance Training   Training Prescription Yes   Weight orange bands   Reps 10-12   Oxygen   Oxygen Continuous   Liters 4L   NuStep   Level 2   Minutes 15   METs 1.5   Arm Ergometer   Level 2   Minutes 15   Track   Laps 2   Minutes 15     Goals Met:  Queuing for purse lip breathing No report of cardiac  concerns or symptoms  Goals Unmet:  RPE O2 Sat  Comments: Service time is from 1330 to 1500   Dr. Rush Farmer is Medical Director for Pulmonary Rehab at [facility].

## 2015-03-28 NOTE — Telephone Encounter (Signed)
Dr. [name], please advise if you would like to work patient in on your schedule or for patient to see Tammy.

## 2015-03-29 ENCOUNTER — Ambulatory Visit (HOSPITAL_COMMUNITY): Payer: Medicare Other

## 2015-03-29 ENCOUNTER — Telehealth: Payer: Self-pay | Admitting: Adult Health

## 2015-03-29 NOTE — Telephone Encounter (Signed)
You can work her in to my schedule for 03/30/15 AM

## 2015-03-29 NOTE — Telephone Encounter (Signed)
Called spoke with [place] and she will check into this.

## 2015-03-29 NOTE — Telephone Encounter (Signed)
See prior phone note. duplicate

## 2015-03-29 NOTE — Telephone Encounter (Signed)
pls call radiology at [facility] and find out why PET scan was canceled

## 2015-03-29 NOTE — Telephone Encounter (Addendum)
Called spoke with pt. She was upset bc she went to have PET scan that was scheduled for today at 1pm and was told it was cancelled when she got there. Pt had a to take a bus to get to this appt. She reports she was told no one could tell her why it was cancelled and it was r/s'd for 04/03/15 at 1pm Pt is now scheduled to see MR on 04/04/15 to arrive at 1:15 per Daneil Dan.  Pt is wanting to know why her appt was cancelled and she was not informed. I do not see where we cancelled this appt. Will let MR know.

## 2015-03-30 ENCOUNTER — Other Ambulatory Visit: Payer: Self-pay | Admitting: Emergency Medicine

## 2015-03-30 ENCOUNTER — Encounter (HOSPITAL_COMMUNITY)
Admission: RE | Admit: 2015-03-30 | Discharge: 2015-03-30 | Disposition: A | Discharge status: Home / Self Care | Payer: Medicare Other | Source: Ambulatory Visit | Attending: Internal Medicine | Admitting: Internal Medicine

## 2015-03-30 DIAGNOSIS — J439 Emphysema, unspecified: Secondary | ICD-10-CM | POA: Diagnosis not present

## 2015-03-30 MED ORDER — SIMVASTATIN 10 MG PO TABS: 20.0000 mg | ORAL_TABLET | Freq: Every day | ORAL | Status: DC

## 2015-03-30 NOTE — Telephone Encounter (Signed)
i called [facility] to find out who cancelled the appt i am waiting on response back Joellen Jersey

## 2015-03-30 NOTE — Progress Notes (Signed)
Daily Session Note  Patient Details  Name: [name] MRN: [id number] Date of Birth: [birth year] Referring Provider:  Binnie Rail, MD  Encounter Date: 03/30/2015  Check In:     Session Check In - 03/30/15 1350    Check-In   Location [facility]   Staff Present Rosebud Poles, RN, Levie Heritage, MA, ACSM RCEP, Exercise Physiologist;Annedrea Rosezella [place], RN, [place];Dorna Bloom, MS, ACSM RCEP, Exercise Physiologist;Portia Rollene Rotunda, RN, BSN   Supervising physician immediately available to respond to emergencies [facility] immediately available   Physician(s) [place]ger[place]y   Medication changes reported     No   Fall or balance concerns reported    No   Warm-up and Cool-down Performed as group-led Location manager Performed Yes   VAD Patient? No   Pain Assessment   Currently in Pain? No/denies   Multiple Pain Sites No      Capillary Blood Glucose: No results found for this or any previous visit (from the past 24 hour(s)).      Exercise Prescription Changes - 03/30/15 1600    Exercise Review   Progression No   Response to Exercise   Blood Pressure (Admit) 100/60 mmHg   Blood Pressure (Exercise) 122/74 mmHg   Blood Pressure (Exit) 110/60 mmHg   Heart Rate (Admit) 85 bpm   Heart Rate (Exercise) 87 bpm   Heart Rate (Exit) 83 bpm   Oxygen Saturation (Admit) 84 %   Oxygen Saturation (Exercise) 88 %   Oxygen Saturation (Exit) 94 %   Rating of Perceived Exertion (Exercise) 13   Perceived Dyspnea (Exercise) 3   Duration Progress to 45 minutes of aerobic exercise without signs/symptoms of physical distress   Intensity THRR unchanged   Progression   Progression Continue to progress workloads to maintain intensity without signs/symptoms of physical distress.   Resistance Training   Training Prescription Yes   Weight orange bands   Reps 10-12   Interval Training   Interval Training No   Oxygen   Oxygen Continuous   Liters 4L   NuStep   Level 2   Minutes 15   METs 1.3   Arm Ergometer   Level 2   Minutes 15     Goals Met:  Proper associated with RPD/PD & O2 Sat Improved SOB with ADL's Queuing for purse lip breathing Strength training completed today  Goals Unmet:  Not Applicable  Comments: Service time is from 1330 to 1530  SEE PAPER copy for education topic today.  Dr. Rush Farmer is Medical Director for Pulmonary Rehab at [facility].

## 2015-03-30 NOTE — Telephone Encounter (Signed)
Libby please advise on status

## 2015-03-31 ENCOUNTER — Ambulatory Visit: Payer: Medicare Other | Admitting: Adult Health

## 2015-04-03 ENCOUNTER — Telehealth: Payer: Self-pay

## 2015-04-03 ENCOUNTER — Ambulatory Visit (HOSPITAL_COMMUNITY)
Admission: RE | Admit: 2015-04-03 | Discharge: 2015-04-03 | Disposition: A | Discharge status: Home / Self Care | Payer: Medicare Other | Source: Ambulatory Visit | Attending: Adult Health | Admitting: Adult Health

## 2015-04-03 DIAGNOSIS — R599 Enlarged lymph nodes, unspecified: Secondary | ICD-10-CM

## 2015-04-03 DIAGNOSIS — K760 Fatty (change of) liver, not elsewhere classified: Secondary | ICD-10-CM | POA: Insufficient documentation

## 2015-04-03 DIAGNOSIS — N200 Calculus of kidney: Secondary | ICD-10-CM | POA: Insufficient documentation

## 2015-04-03 DIAGNOSIS — R591 Generalized enlarged lymph nodes: Secondary | ICD-10-CM | POA: Insufficient documentation

## 2015-04-03 DIAGNOSIS — C760 Malignant neoplasm of head, face and neck: Secondary | ICD-10-CM | POA: Diagnosis not present

## 2015-04-03 LAB — GLUCOSE, CAPILLARY: GLUCOSE-CAPILLARY: 125 mg/dL — AB (ref 65–99)

## 2015-04-03 MED ORDER — FLUDEOXYGLUCOSE F - 18 (FDG) INJECTION
11.0000 | Freq: Once | INTRAVENOUS | Status: AC | PRN
  Administered 2015-04-03: 11 via INTRAVENOUS

## 2015-04-03 NOTE — Telephone Encounter (Signed)
Made on: 03/27/2015 2:16 PM by Gaetano Hawthorne C Cancelled on: 03/27/2015 2:29 PM by Madelaine Etienne K Cancel Rsn: Provider (Needed appt sooner.) ----------------------------------------------------------------- Notes: 03-27-15, called pt no answer no vm,kdm Made On:  03/27/2015 2:30 PM By: Madelaine Etienne K Change Notes: 03/27/2015 3:37 PM By: Hinton Rao D Canceled: 03/28/2015 9:08 AM By: Charlie Pitter, AMBER N Cancel Rsn: No Show/Cancel within 24 hours

## 2015-04-03 NOTE — Telephone Encounter (Signed)
Pt was scheduled on 03/29/15 but schedulers were to to get her in sooner if possible so pt was then moved to 03/28/15 but no one was able to reach her so it looks like she no showed for that visit then she shows up on 03/29/15 for the pet and the appt was cancelled because they tried to reach her to get her in on 03/28/15 anyway they had to change the appt to 04/03/15@1  [name]

## 2015-04-03 NOTE — Telephone Encounter (Signed)
PA initiated via [place]

## 2015-04-04 ENCOUNTER — Encounter (HOSPITAL_COMMUNITY): Payer: Medicare Other

## 2015-04-04 ENCOUNTER — Other Ambulatory Visit: Payer: Self-pay | Admitting: Internal Medicine

## 2015-04-04 ENCOUNTER — Telehealth: Payer: Self-pay | Admitting: Geriatric Medicine

## 2015-04-04 ENCOUNTER — Encounter: Payer: Self-pay | Admitting: Internal Medicine

## 2015-04-04 ENCOUNTER — Ambulatory Visit (INDEPENDENT_AMBULATORY_CARE_PROVIDER_SITE_OTHER): Payer: Medicare Other | Admitting: Internal Medicine

## 2015-04-04 VITALS — BP 116/78 | HR 88 | Ht 64.0 in | Wt 223.0 lb

## 2015-04-04 DIAGNOSIS — R599 Enlarged lymph nodes, unspecified: Secondary | ICD-10-CM | POA: Diagnosis not present

## 2015-04-04 DIAGNOSIS — R590 Localized enlarged lymph nodes: Secondary | ICD-10-CM | POA: Insufficient documentation

## 2015-04-04 MED ORDER — ALPRAZOLAM 0.5 MG PO TABS: ORAL_TABLET | ORAL | Status: DC

## 2015-04-04 NOTE — Telephone Encounter (Signed)
PA Approved thru 04/03/2016

## 2015-04-04 NOTE — Telephone Encounter (Signed)
Faxed to pharm 

## 2015-04-04 NOTE — Patient Instructions (Signed)
ICD-9-CM ICD-10-CM   1. Mediastinal adenopathy 785.6 R59.9     Possibilities of sarcoidosis, some low-grade infection, lymphoma  Plan - Refer to thoracic surgery Dr. Roxan Hockey or Dr. Pia Mau for evaluation of mediastinoscopy and endobronchial ultrasound bronchoscopy with biopsy  Follow-up - Return to see me in 1 month or sooner if needed

## 2015-04-04 NOTE — Telephone Encounter (Signed)
printed

## 2015-04-04 NOTE — Telephone Encounter (Signed)
Patient would like a refill of her Alprazolam 0.5 mg tablets sent to [facility]. Please advise, thanks.

## 2015-04-04 NOTE — Telephone Encounter (Signed)
Last OV 01/20/15. Last filled by Dr Sharlet [place] on 03/11/15. Okay to fill?

## 2015-04-04 NOTE — Progress Notes (Signed)
Subjective:     Patient ID: [name], female   DOB: Jun 20, 1945, 70 y.o.   MRN: [id number]  HPI   70 yo female with COPD ,O2 dependent,   TEST  FEV-1 in 2008 was 63% with a diffusion capacity of 33% PFTs 78/14 shows gold stage 3 copd with fev1 1.12L/49%, FVC 2.01L/70% - post bd parameters. No bd response, Ratio 60/77%. TLC 87%, DLCO 5/21% Duplex LE - 07/21/12 - negative. BNP 07/16/12 - 25   03/10/2015 Follow up : COPD, O2 dependent, Lung nodule  Pt returns for follow up . Had suspected flare 2 weeks ago with increased cough and dyspnea.  She is going to pulmonary rehab. O2 sats have been dropping during exercise.  She was given a steroid taper and abx .  Does feel some better but sill has DOE. Cough is better.  Set up for a 2 D echo on 03/08/15 showed EF 60-65%, Grade 1 DD , PAP mod increased at 18mmHg.  Says she was checked for sleep apnea but was told she did not have sleep apnea. This was >68yrs ago and weight was about 10lbs lighter.  Still smoking , advised on smoking cessation. Has cut back .   CT chest was not done as recommended to follow lung nodule, discussed setting this up soon.   Prevnar and PVX and flu vaccines are utd. .   On Symbicort and Spiriva .   On oxygen 3 l/m rest and 4l/m act .   Denies chest pain, orthopnea , increased edema , calf pain, hemoptysis .     OV 04/04/2015  Chief Complaint  Patient presents with  . Follow-up    Pt here after PET scan to review results. Pt states her breathing is unchanged since OV with TP - pt states her SOB is not at baseline. Pt c/o prod cough with tan mucus.     70 year old female with above medical problems presents for follow-up after PET scan. After my last visit in September 2016 out at a CT scan of the chest as part of routine evaluation. She finally got it done in end of February 2017. This showed prominent increase in mediastinal adenopathy. She finally had a PET scan which I personally visualized and this shows  prominent bilateral hilar adenopathy and mediastinal adenopathy along with axillary adenopathy with high uptake SUV. In addition there is fatty liver, peri-umbilicus hernia and also some renal stones. She is a symptomatic from these. She is now here with his sister Peralrine to discuss these results.   Review of Systems     Objective:   Physical Exam  Filed Vitals:   04/04/15 1432  BP: 116/78  Pulse: 88  Height: 5\' 4"  (1.626 m)  Weight: 223 lb (101.152 kg)  SpO2: 94%    Discussion ojnly visit     Assessment:       ICD-9-CM ICD-10-CM   1. Mediastinal adenopathy 785.6 R59.9        Plan:       Possibilities of sarcoidosis, some low-grade infection, lymphoma  Plan - Refer to thoracic surgery Dr. Roxan Hockey or Dr. Pia Mau for evaluation of mediastinoscopy and endobronchial ultrasound bronchoscopy with biopsy  Follow-up - Return to see me in 1 month or sooner if needed   (> 50% of this 15 min visit spent in face to face counseling or/and coordination of care)  Dr. Brand Males, M.D., [facility].C.P Pulmonary and Critical Care Medicine Staff Physician [place] [facility] Pager:  [phone], If no answer or between  15:00h - 7:00h: call [phone]  04/04/2015 3:02 PM

## 2015-04-04 NOTE — Addendum Note (Signed)
Addended by: Binnie Rail on: 04/04/2015 02:59 PM   Modules accepted: Orders

## 2015-04-05 ENCOUNTER — Encounter: Payer: Self-pay | Admitting: Internal Medicine

## 2015-04-05 NOTE — Telephone Encounter (Signed)
PET already done  Will close encounter

## 2015-04-05 NOTE — Telephone Encounter (Signed)
Looks like this was printed yesterday - just confirm

## 2015-04-05 NOTE — Telephone Encounter (Signed)
Originally sent to mail order pharm. Pt called asking to send to local. Have called [facility] to fill. Faxed D?C orders to mail order.

## 2015-04-06 ENCOUNTER — Encounter: Payer: Self-pay | Admitting: Internal Medicine

## 2015-04-06 ENCOUNTER — Encounter (HOSPITAL_COMMUNITY)
Admission: RE | Admit: 2015-04-06 | Discharge: 2015-04-06 | Disposition: A | Discharge status: Home / Self Care | Payer: Medicare Other | Source: Ambulatory Visit | Attending: Internal Medicine | Admitting: Internal Medicine

## 2015-04-06 DIAGNOSIS — J439 Emphysema, unspecified: Secondary | ICD-10-CM | POA: Diagnosis not present

## 2015-04-06 NOTE — Progress Notes (Signed)
Daily Session Note  Patient Details  Name: [name] MRN: [id number] Date of Birth: 1945/11/01 Referring Provider:  Binnie Rail, MD  Encounter Date: 04/06/2015  Check In:     Session Check In - 04/06/15 1401    Check-In   Location [facility]   Staff Present Luetta Nutting Fair, MS, ACSM RCEP, Exercise Physiologist;Portia Rollene Rotunda, Therapist, sports, BSN;Ramon Dredge, RN, MHA;Joan Leonia Reeves, RN, Fletcher Anon, M.Ed., RD, LDN, CDE, Clinical Nutritionist II   Supervising physician immediately available to respond to emergencies [facility] immediately available   Physician(s) Dr. Jerilee Hoh   Medication changes reported     No   Fall or balance concerns reported    No   Warm-up and Cool-down Performed as group-led instruction   Resistance Training Performed Yes   VAD Patient? No   Pain Assessment   Currently in Pain? No/denies   Multiple Pain Sites No      Capillary Blood Glucose: No results found for this or any previous visit (from the past 24 hour(s)).      Exercise Prescription Changes - 04/06/15 1500    Exercise Review   Progression No   Response to Exercise   Blood Pressure (Admit) 112/70 mmHg   Blood Pressure (Exercise) 130/70 mmHg   Blood Pressure (Exit) 110/60 mmHg   Heart Rate (Admit) 83 bpm   Heart Rate (Exercise) 84 bpm   Heart Rate (Exit) 80 bpm   Oxygen Saturation (Admit) 86 %   Oxygen Saturation (Exercise) 80 %   Oxygen Saturation (Exit) 92 %   Rating of Perceived Exertion (Exercise) 11   Perceived Dyspnea (Exercise) 2   Duration Progress to 45 minutes of aerobic exercise without signs/symptoms of physical distress   Intensity THRR unchanged   Progression   Progression Continue to progress workloads to maintain intensity without signs/symptoms of physical distress.   Resistance Training   Training Prescription Yes   Weight orange bands   Reps 10-12   Interval Training   Interval Training No   Oxygen   Oxygen Continuous   Liters 4   NuStep   Level 2   Minutes 30   METs 1.5     Goals Met:  Queuing for purse lip breathing No report of cardiac concerns or symptoms Strength training completed today  Goals Unmet:  O2 Sat, dropped to 80% today. She was SOB required rest breaks, encouraged PLB and kept her on the nustep for two stations.  Comments: Service time is from 1330 to 1525    Dr. Rush Farmer is Medical Director for Pulmonary Rehab at [facility].

## 2015-04-06 NOTE — Telephone Encounter (Signed)
Pt is wanting to know if she able to attend rehab? Please advise MR thanks

## 2015-04-11 ENCOUNTER — Telehealth: Payer: Self-pay | Admitting: Internal Medicine

## 2015-04-11 ENCOUNTER — Telehealth (HOSPITAL_COMMUNITY): Payer: Self-pay | Admitting: *Deleted

## 2015-04-11 ENCOUNTER — Encounter (HOSPITAL_COMMUNITY): Admission: RE | Admit: 2015-04-11 | Payer: Medicare Other | Source: Ambulatory Visit

## 2015-04-11 MED ORDER — PREDNISONE 10 MG PO TABS: ORAL_TABLET | ORAL | Status: DC

## 2015-04-11 MED ORDER — CEPHALEXIN 500 MG PO CAPS: 500.0000 mg | ORAL_CAPSULE | Freq: Three times a day (TID) | ORAL | Status: DC

## 2015-04-11 NOTE — Telephone Encounter (Signed)
Pt c/o increased SOB, cough with cream colored mucus, hot to cold feelings, loss of appetite and some wheeze at night. Pt denies f/n/v. Pt has used albuterol nebs with some improvement. Pt asking for abx and oral pred. Pt also would like to know if she should continue Pulm Rehab or wait until after surgery to resume.   MR please advise.   Allergies  Allergen Reactions  . Ambien [Zolpidem Tartrate] Other (See Comments)    Causes nightmares    LOV 04/04/15 w/MR NextOV 05/08/15 w/TP

## 2015-04-11 NOTE — Telephone Encounter (Signed)
Already responded ni phone message 04/11/2015

## 2015-04-11 NOTE — Telephone Encounter (Signed)
Spoke with pt, aware of below recs.  rx's sent to preferred pharmacy.   Pt states she has her consult with her surgeon tomorrow, and sx date will be scheduled then.  Forwarding to MR to make aware.  Nothing further needed.

## 2015-04-11 NOTE — Telephone Encounter (Signed)
pulm rehab - hold till she gets better from current aecopd but after that can continue till surgery and then hold till she recoves from surgery. When is surgery date?  Currently in AECOPD - Please take prednisone 40 mg x1 day, then 30 mg x1 day, then 20 mg x1 day, then 10 mg x1 day, and then 5 mg x1 day and stop  - Cephalexin 500mg  tid x 5 days  Dr. Brand Males, M.D., [facility].C.P Pulmonary and Critical Care Medicine Staff Physician [place] [facility] Pager: [phone], If no answer or between  15:00h - 7:00h: call [phone]  04/11/2015 1:12 PM    .   Allergies  Allergen Reactions  . Ambien [Zolpidem Tartrate] Other (See Comments)    Causes nightmares

## 2015-04-12 ENCOUNTER — Institutional Professional Consult (permissible substitution) (INDEPENDENT_AMBULATORY_CARE_PROVIDER_SITE_OTHER): Payer: Medicare Other | Admitting: Cardiothoracic Surgery

## 2015-04-12 ENCOUNTER — Encounter: Payer: Self-pay | Admitting: Cardiothoracic Surgery

## 2015-04-12 VITALS — BP 114/70 | HR 86 | Resp 18 | Ht 64.0 in | Wt 223.0 lb

## 2015-04-12 DIAGNOSIS — R599 Enlarged lymph nodes, unspecified: Secondary | ICD-10-CM

## 2015-04-12 DIAGNOSIS — R590 Localized enlarged lymph nodes: Secondary | ICD-10-CM

## 2015-04-13 ENCOUNTER — Encounter (HOSPITAL_COMMUNITY): Payer: Medicare Other

## 2015-04-13 NOTE — Progress Notes (Signed)
[address]       [place] [address]             [phone]                    Jamieka Tartt Parrott Medical Record [id number] Date of Birth: 1945-11-02  Referring: Brand Males, MD Primary Care: Binnie Rail, MD  Chief Complaint:    Chief Complaint  Patient presents with  . Adenopathy    Surgical eval for possible mediastinoscopy ,EBUS with BX, PET Scan 04/03/15, Chest CT 03/21/15     History of Present Illness:    [name] 70 y.o. female is seen in the office  today for Consideration of mediastinoscopy. The patient's presentation to the office today she is in the midst of an acute exacerbation of her severe COPD. She is referred to consider mediastinoscopy to biopsy enlarged mediastinal nodes present on a recent CT scan. She called the pulmonary office yesterday because of her worsening shortness of breath with minimal activity and was started on antibiotics and steroids.   Current Activity/ Functional Status:  Patient is not independent with mobility/ambulation, transfers, ADL's, IADL's.   Zubrod Score: At the time of surgery this patient's most appropriate activity status/level should be described as: []     0    Normal activity, no symptoms []     1    Restricted in physical strenuous activity but ambulatory, able to do out light work []     2    Ambulatory and capable of self care, unable to do work activities, up and about               >50 % of waking hours                              [x]     3    Only limited self care, in bed greater than 50% of waking hours []     4    Completely disabled, no self care, confined to bed or chair []     5    Moribund   Past Medical History  Diagnosis Date  . Hypertension   . COPD (chronic obstructive pulmonary disease) ([place])      FEV-1 in 2008 was 63% with a diffusion capacity of 33%.   . Tobacco abuse      Smokes one pack a day since age 57.  Marland Kitchen History of cervical cancer 1982  . Pulmonary nodule  march 2012    71mm RUL and RLL 1st seen march 2012 CT, ?progression 12/2012 CT  . 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 Bronchiectasis march 2012    On CT chest. RUL. Mild  . Medical non-compliance   . Chicken pox   . Seasonal allergies   . Arrhythmia   . Hyperlipidemia   . Tobacco abuse   . Chronic diastolic heart failure ([place])   . Chronic respiratory failure ([place])     Followed in Pulmonary clinic/ [facility]/ [facility]as[facility]  - 06/30/2012 desat to 86%  RA walking 50 ft, recovered to 90% at rest - 06/30/2012  Walked 1lpm x 3 laps @ 185 ft each stopped due to  Sob, no desat  rec 02 2lpm with activity and sleeping, ok at rest   . Generalized anxiety disorder   . Leg swelling  Venous doppler right 07/21/12 >>Neg    . MITRAL VALVE PROLAPSE   . Obesity, unspecified   . CHF (congestive heart failure) [facility])     Past Surgical History  Procedure Laterality Date  . Total abdominal hysterectomy      Family History  Problem Relation Age of Onset  . Other Father     MVA  . Esophageal cancer Mother     Social History   Social History  . Marital Status: Divorced    Spouse Name: N/A  . Number of Children: N/A  . 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 Smoking status: Current Every Day Smoker -- 0.50 packs/day for 44 years    Types: Cigarettes  . Smokeless tobacco: Never Used     Comment: down to 4 cigs per day plus 5 ecigs with nicotien daily//3.14.17  . Alcohol Use: 0.0 oz/week    0 Standard drinks or equivalent per week     Comment: occasional  . Drug Use: No  . Sexual Activity: Not on file   Other Topics Concern  . Not on file   Social History Narrative   Married but recently separated from husband    History  Smoking status  . Current Every Day Smoker -- 0.50 packs/day for 44 years  . Types: Cigarettes  Smokeless tobacco  . Never Used    Comment: down to  4 cigs per day plus 5 ecigs with nicotien daily//3.14.17    History  Alcohol Use  . 0.0 oz/week  . 0 Standard drinks or equivalent per week    Comment: occasional     Allergies  Allergen Reactions  . Ambien [Zolpidem Tartrate] Other (See Comments)    Causes nightmares    Current Outpatient Prescriptions  Medication Sig Dispense Refill  . albuterol (PROVENTIL) (2.5 MG/3ML) 0.083% nebulizer solution USE 3 ML'S IN NEBULIZER EVERY 6 HOURS AS NEEDED FOR WHEEZING 100 vial 2  . ALPRAZolam (XANAX) 0.5 MG tablet TAKE 1 TABLET TWICE A DAY AS NEEDED FOR ANXIETY 60 tablet 0  . budesonide-formoterol (SYMBICORT) 160-4.5 MCG/ACT inhaler Inhale 2 puffs into the lungs 2 (two) times daily. 3 Inhaler 1  . cephALEXin (KEFLEX) 500 MG capsule Take 1 capsule (500 mg total) by mouth 3 (three) times daily. 15 capsule 0  . cloNIDine (CATAPRES) 0.1 MG tablet Take 1 tablet (0.1 mg total) by mouth 2 (two) times daily. 180 tablet 3  . famotidine (PEPCID) 20 MG tablet Take 1 tablet (20 mg total) by mouth at bedtime. 90 tablet 3  . fluticasone (FLONASE) 50 MCG/ACT nasal spray Place 2 sprays into both nostrils daily as needed for allergies or rhinitis. 16 g 11  . furosemide (LASIX) 40 MG tablet TAKE 1 TABLET (40 MG TOTAL) BY MOUTH DAILY. 90 tablet 1  . losartan (COZAAR) 25 MG tablet Take 1 tablet (25 mg total) by mouth daily. 90 tablet 3  . metoprolol tartrate (LOPRESSOR) 25 MG tablet Take 0.5 tablets (12.5 mg total) by mouth 2 (two) times daily. 90 tablet 3  . omeprazole (PRILOSEC) 20 MG capsule Take 1 capsule (20 mg total) by mouth daily. 90 capsule 3  . predniSONE (DELTASONE) 10 MG tablet 4 tabs X1 day, 3 tabs X1 day, 2 tabs X1 day, 1 tab X1 day, 0.5 tab X1 day, then stop. 11 tablet 0  . simvastatin (ZOCOR) 10 MG tablet Take 2 tablets (20 mg total) by  mouth daily at 6 PM. 180 tablet 1  . tiotropium (SPIRIVA HANDIHALER) 18 MCG inhalation capsule PLACE 1 CAPSULE INTO HANDIHALER AND INHALE EVERY DAY 90 capsule 3  .  VENTOLIN HFA 108 (90 BASE) MCG/ACT inhaler INHALE 2 PUFFS INTO THE LUNGS EVERY 6 HOURS AS NEEDED 18 Inhaler 2   No current facility-administered medications for this visit.      Review of Systems:     Cardiac Review of Systems: Y or N  Chest Pain [ n   ]  Resting SOB [ y  ] Exertional SOB  [y]  Orthopnea [ y ]   Pedal Edema Blue.Reese   ]    Palpitations [  n] Syncope  [ n ]   Presyncope [  n ]  General Review of Systems: [Y] = yes [  ]=no Constitional: recent weight change [  ];  Wt loss over the last 3 months [   ] anorexia [  ]; fatigue Blue.Reese  ]; nausea [  ]; night sweats [  ]; fever [  ]; or chills [  ];          Dental: poor dentition[  ]; Last Dentist visit:   Eye : blurred vision [  ]; diplopia [   ]; vision changes [  ];  Amaurosis fugax[  ]; Resp: cough Blue.Reese  ];  wheezing[ y ];  hemoptysis[n  ]; shortness of breath[y  ]; paroxysmal nocturnal dyspnea[y  ]; dyspnea on exertion[ y ]; or orthopnea[  ];  GI:  gallstones[  ], vomiting[  ];  dysphagia[  ]; melena[  ];  hematochezia [  ]; heartburn[  ];   Hx of  Colonoscopy[  ]; GU: kidney stones [  ]; hematuria[  ];   dysuria [  ];  nocturia[  ];  history of     obstruction [  ]; urinary frequency [  ]             Skin: rash, swelling[  ];, hair loss[  ];  peripheral edema[  ];  or itching[  ]; Musculosketetal: myalgias[  ];  joint swelling[  ];  joint erythema[  ];  joint pain[  ];  back pain[  ];  Heme/Lymph: bruising[  ];  bleeding[  ];  anemia[  ];  Neuro: TIA[  ];  headaches[  ];  stroke[  ];  vertigo[  ];  seizures[ n ];   paresthesias[  ];  difficulty walking[  ];  Psych:depression[  ]; Sylvester Harder  ];  Endocrine: diabetes[  ];  thyroid dysfunction[  ];  Immunizations: Flu up to date [  ]; Pneumococcal up to date [  ];  Other:  Physical Exam: BP 114/70 mmHg  Pulse 86  Resp 18  Ht 5\' 4"  (1.626 m)  Wt 223 lb (101.152 kg)  BMI 38.26 kg/m2  SpO2 90%  PHYSICAL EXAMINATION: General appearance: alert, cooperative, fatigued and mild  distress Head: Normocephalic, without obvious abnormality, atraumatic Neck: no adenopathy, no carotid bruit, no JVD, supple, symmetrical, trachea midline and thyroid not enlarged, symmetric, no tenderness/mass/nodules Lymph nodes: Cervical, supraclavicular, and axillary nodes normal. Resp: diminished breath sounds bilaterally Back: symmetric, no curvature. ROM normal. No CVA tenderness. Cardio: regular rate and rhythm, S1, S2 normal, no murmur, click, rub or gallop GI: soft, non-tender; bowel sounds normal; no masses,  no organomegaly Extremities: extremities normal, atraumatic, no cyanosis or edema and Homans sign is negative, no sign of DVT Neurologic: Grossly normal  Diagnostic Studies &  Laboratory data:     Recent Radiology Findings:   Ct Chest Wo Contrast  03/21/2015  CLINICAL DATA:  COPD.  Follow-up pulmonary nodule. EXAM: CT CHEST WITHOUT CONTRAST TECHNIQUE: Multidetector CT imaging of the chest was performed following the standard protocol without IV contrast. COMPARISON:  01/11/2013 chest CT angiogram. FINDINGS: Mediastinum/Nodes: Normal heart size. Trace pericardial fluid/ thickening, unchanged. Coronary atherosclerosis. Atherosclerotic tortuous nonaneurysmal thoracic aorta. Normal caliber main pulmonary artery. Probable 0.7 cm hypodense left thyroid lobe nodule, for which no further follow-up is recommended. This follows ACR consensus guidelines: Managing Incidental Thyroid Nodules Detected on Imaging: White Paper of the ACR Incidental Thyroid Findings Committee. J Am Coll Radiol 2015; 12:143-150. Normal esophagus. No axillary adenopathy. There are several new enlarged right paratracheal lymph nodes, largest 2.0 cm (series 2/ image 20). There is a new moderately enlarged 1.9 cm subcarinal node (series 2/ image 25). Mildly enlarged 1.3 cm AP window node (series 2/ image 20) is unchanged since 01/11/2013. Hilar nodes are poorly delineated on this noncontrast study, and there is a suggestion  of several mildly to moderately enlarged bilateral hilar lymph nodes, which appear new on the right measuring up to 1.5 cm in the right infrahilar region (series 2/ image 32) and which appear increased on the left measuring up to 1.3 cm (series 2/image 29). Lungs/Pleura: No pneumothorax. No pleural effusion. Moderate to severe centrilobular emphysema and diffuse bronchial wall thickening. Anterior right upper lobe 4 mm pulmonary nodule (series 3/ image 24) and basilar right upper lobe 5 mm pulmonary nodule (series 3/ image 27) are stable since 01/11/2013 and probably benign. Two right middle lobe 4 mm solid pulmonary nodules (series 3/ images 35 and 41) are stable since 01/11/2013 and probably benign. Left lower lobe 3 mm pulmonary nodule (series 3/image 34) is stable since 01/11/2013 and probably benign. No acute consolidative airspace disease, new significant pulmonary nodules or lung masses. Stable parenchymal band with architectural distortion in the right upper lobe, in keeping with postinfectious/postinflammatory scarring. Upper abdomen: Diffuse hepatic steatosis. Musculoskeletal: No aggressive appearing focal osseous lesions. Mild degenerative changes in the thoracic spine. IMPRESSION: 1. Mediastinal and bilateral hilar lymphadenopathy is increased since 01/11/2013. Findings are concerning for lymphoma, with metastatic disease less likely but not excluded. Consider further evaluation with PET-CT and/or tissue sampling. 2. Subcentimeter pulmonary nodules in both lungs are unchanged for over 2 years and are probably benign. No new lung findings. 3. Moderate to severe centrilobular emphysema and diffuse bronchial wall thickening, in keeping with the provided history of COPD. 4. Additional findings include coronary atherosclerosis and diffuse hepatic steatosis. Electronically Signed   By: Ilona Sorrel M.D.   On: 03/21/2015 18:34   Nm Pet Image Initial (pi) Skull Base To Thigh  04/03/2015  CLINICAL DATA:   Initial treatment strategy for mediastinal and hilar lymph nodes. Cervical cancer. EXAM: NUCLEAR MEDICINE PET SKULL BASE TO THIGH TECHNIQUE: 11.0 mCi F-18 FDG was injected intravenously. Full-ring PET imaging was performed from the skull base to thigh after the radiotracer. CT data was obtained and used for attenuation correction and anatomic localization. FASTING BLOOD GLUCOSE:  Value: 125 mg/dl COMPARISON:  CT chest 03/21/2015 and 01/11/2013. FINDINGS: NECK A 6 mm nodule posterior to the right parotid gland (CT image 21) has an SUV max of 4.3. No additional hypermetabolic lymph nodes in the neck. Scattered muscular uptake is likely physiologic. Thyroid is mildly hypermetabolic, nonspecific. CT images show no acute findings. CHEST There is hypermetabolic mediastinal, bi hilar and left axillary adenopathy. Index  low right paratracheal lymph node measures 2.4 cm (CT image 62) with an SUV max of 19.8. Hypermetabolic periesophageal lymph node measures 11 mm (CT image 83) with an SUV max of 22.0. Additional hypermetabolic lymph nodes are seen in the extrapleural space along the medial lower right hemi thorax (CT image 92) and posterior to the IVC (CT image 92). Patchy ground-glass in the right upper lobe is mildly hypermetabolic (series 6, CT image 21) and likely postinflammatory/ postinfectious in etiology. No hypermetabolic pulmonary nodules. Heart is mildly enlarged. No pericardial or pleural effusion. ABDOMEN/PELVIS No abnormal hypermetabolism in the liver, adrenal glands, spleen or pancreas. No hypermetabolic lymph nodes. CT images show low-attenuation throughout the liver, with areas of peripheral sparing. Gallbladder and adrenal glands are unremarkable. Stones are seen in the right kidney. Kidneys, spleen, pancreas, stomach and bowel are otherwise grossly unremarkable. Periumbilical hernia contains fat. No free fluid. SKELETON No abnormal osseous hypermetabolism. IMPRESSION: 1. Intensely hypermetabolic adenopathy  in the chest is highly worrisome for lymphoma. Sub cm lymph node posterior to the right parotid gland is hypermetabolic and may be involved as well. 2. Hepatic steatosis. 3. Right renal stones. Electronically Signed   By: Lorin Picket M.D.   On: 04/03/2015 14:47     I have independently reviewed the above radiologic studies.  Recent Lab Findings: Lab Results  Component Value Date   WBC 6.8 01/20/2015   HGB 15.0 01/20/2015   HCT 46.0 01/20/2015   PLT 327.0 01/20/2015   GLUCOSE 106* 03/10/2015   CHOL 172 01/20/2015   TRIG 165.0* 01/20/2015   HDL 34.30* 01/20/2015   LDLDIRECT 113.0 07/26/2014   LDLCALC 105* 01/20/2015   ALT 30 01/20/2015   AST 35 01/20/2015   NA 141 03/10/2015   K 4.7 03/10/2015   CL 104 03/10/2015   CREATININE 0.94 03/10/2015   BUN 6 03/10/2015   CO2 32 03/10/2015   TSH 3.88 01/20/2015   HGBA1C 6.9* 01/20/2015      Assessment / Plan:   Patient with mediastinal adenopathy, severe underlying pulmonary disease- with adenopathy question of lymphoma versus small cell lung cancer versus sarcoidosis- 14 in the office today the patient is severely worse than usual with her respiratory status and actively being treated for COPD exacerbation by the pulmonary service. At this point she is in no condition to proceed with general anesthesia to consider mediastinoscopy. The scans were reviewed with her in description what is involved with biopsy was discussed. I've referred her back to pulmonary service. When her respiratory status is improved in suitable for general anesthesia she can be referred back to thoracic surgery office.      I  spent 30 minutes counseling the patient face to face and 50% or more the  time was spent in counseling and coordination of care. The total time spent in the appointment was 40 minutes.  Grace Isaac MD      [address] [place] [address] Office [phone]   Beeper [phone]  04/13/2015 11:58  AM

## 2015-04-14 ENCOUNTER — Telehealth: Payer: Self-pay | Admitting: Internal Medicine

## 2015-04-14 NOTE — Telephone Encounter (Signed)
[name]  Pls have Shernell Choquette visit withTP  In < 10days. One to make sure she is resolved from aecopd and two to do pre-op eval for mediastinascopy

## 2015-04-17 NOTE — Telephone Encounter (Signed)
Pt called. Informed her of the recs per MR. Pt has appt with TP on 04/25/15. Pt verbalized understanding and denied any further questions or concerns at this time.

## 2015-04-18 ENCOUNTER — Encounter (HOSPITAL_COMMUNITY)
Admission: RE | Admit: 2015-04-18 | Discharge: 2015-04-18 | Disposition: A | Discharge status: Home / Self Care | Payer: Medicare Other | Source: Ambulatory Visit | Attending: Internal Medicine | Admitting: Internal Medicine

## 2015-04-18 VITALS — Wt 227.7 lb

## 2015-04-18 DIAGNOSIS — J438 Other emphysema: Secondary | ICD-10-CM

## 2015-04-18 DIAGNOSIS — J439 Emphysema, unspecified: Secondary | ICD-10-CM | POA: Diagnosis not present

## 2015-04-18 NOTE — Progress Notes (Signed)
Daily Session Note  Patient Details  Name: Lejla Moeser MRN: [id number] Date of Birth: 08/07/1945 Referring Provider:  Binnie Rail, MD  Encounter Date: 04/18/2015  Check In:     Session Check In - 04/18/15 1642    Check-In   Location [facility]   Staff Present Rosebud Poles, RN, BSN   Supervising physician immediately available to respond to emergencies [facility] immediately available   Physician(s) Dr. Marily Memos   Medication changes reported     No   Warm-up and Cool-down Performed as group-led instruction   Resistance Training Performed Yes   VAD Patient? No   Pain Assessment   Currently in Pain? No/denies   Multiple Pain Sites No      Capillary Blood Glucose: No results found for this or any previous visit (from the past 24 hour(s)).      Exercise Prescription Changes - 04/18/15 1600    Exercise Review   Progression No   Response to Exercise   Blood Pressure (Admit) 110/68 mmHg   Blood Pressure (Exercise) 90/52 mmHg   Blood Pressure (Exit) 100/64 mmHg   Heart Rate (Admit) 79 bpm   Heart Rate (Exercise) 92 bpm   Heart Rate (Exit) 77 bpm   Oxygen Saturation (Admit) 90 %   Oxygen Saturation (Exercise) 85 %   Oxygen Saturation (Exit) 95 %   Rating of Perceived Exertion (Exercise) 12   Perceived Dyspnea (Exercise) 2   Symptoms extreme dyspnea   Comments this was a normal day for her   Duration Progress to 45 minutes of aerobic exercise without signs/symptoms of physical distress   Intensity THRR unchanged   Progression   Progression Continue to progress workloads to maintain intensity without signs/symptoms of physical distress.   Resistance Training   Training Prescription Yes   Weight orange bands   Reps 10-12   Interval Training   Interval Training No   Oxygen   Oxygen Continuous   Liters 4   NuStep   Level 2   Minutes 25   METs 1.3   Arm Ergometer   Level 2   Minutes 15   Home Exercise Plan   Plans to continue exercise  at Home   Frequency Add 1 additional day to program exercise sessions.  will walk in the house at home     Goals Met:  Queuing for purse lip breathing Strength training completed today  Goals Unmet:  Not Applicable  Comments: Service time is from 1330 to 1515    Dr. Rush Farmer is Medical Director for Pulmonary Rehab at [facility].

## 2015-04-19 ENCOUNTER — Encounter: Payer: Self-pay | Admitting: Internal Medicine

## 2015-04-20 ENCOUNTER — Encounter (HOSPITAL_COMMUNITY): Payer: Medicare Other

## 2015-04-24 ENCOUNTER — Telehealth (HOSPITAL_COMMUNITY): Payer: Self-pay | Admitting: *Deleted

## 2015-04-25 ENCOUNTER — Encounter: Payer: Self-pay | Admitting: Adult Health

## 2015-04-25 ENCOUNTER — Encounter (HOSPITAL_COMMUNITY): Admission: RE | Admit: 2015-04-25 | Payer: Medicare Other | Source: Ambulatory Visit

## 2015-04-25 ENCOUNTER — Ambulatory Visit (INDEPENDENT_AMBULATORY_CARE_PROVIDER_SITE_OTHER): Payer: Medicare Other | Admitting: Adult Health

## 2015-04-25 VITALS — BP 98/68 | HR 91 | Temp 98.4°F | Ht 64.0 in | Wt 223.0 lb

## 2015-04-25 DIAGNOSIS — R599 Enlarged lymph nodes, unspecified: Secondary | ICD-10-CM | POA: Diagnosis not present

## 2015-04-25 DIAGNOSIS — J9611 Chronic respiratory failure with hypoxia: Secondary | ICD-10-CM

## 2015-04-25 DIAGNOSIS — J441 Chronic obstructive pulmonary disease with (acute) exacerbation: Secondary | ICD-10-CM

## 2015-04-25 DIAGNOSIS — I5032 Chronic diastolic (congestive) heart failure: Secondary | ICD-10-CM

## 2015-04-25 DIAGNOSIS — R590 Localized enlarged lymph nodes: Secondary | ICD-10-CM

## 2015-04-25 DIAGNOSIS — R911 Solitary pulmonary nodule: Secondary | ICD-10-CM

## 2015-04-25 MED ORDER — PREDNISONE 10 MG PO TABS: ORAL_TABLET | ORAL | Status: DC

## 2015-04-25 NOTE — Assessment & Plan Note (Signed)
Compensated without evidence of fluid overload.

## 2015-04-25 NOTE — Assessment & Plan Note (Signed)
CT chest 03/21/15 showed increased mediastinal/bilateral hilar lymphadenopahty worrisome for lymphoma. Unchanged pulmonary nodules x 2 years.  Subsequent PET scan showed intensely hypermetabolic  adenopahty in chest .  She has seen surgeon but needs to be improved from COPD exacerbation prior to surgery .

## 2015-04-25 NOTE — Assessment & Plan Note (Signed)
CT chest showed stable nodules x 2 years. C/w benign processs

## 2015-04-25 NOTE — Addendum Note (Signed)
Addended by: Osa Craver on: 04/25/2015 03:15 PM   Modules accepted: Orders

## 2015-04-25 NOTE — Progress Notes (Signed)
Subjective:    Patient ID: [name], female    DOB: Aug 08, 1945, 70 y.o.   MRN: [id number]  HPI 70 yo female with COPD ,O2 dependent,   TEST  FEV-1 in 2008 was 63% with a diffusion capacity of 33% PFTs 78/14 shows gold stage 3 copd with fev1 1.12L/49%, FVC 2.01L/70% - post bd parameters. No bd response, Ratio 60/77%. TLC 87%, DLCO 5/21% Duplex LE - 07/21/12 - negative. BNP 07/16/12 - 25  2 D echo on 03/08/15 showed EF 60-65%, Grade 1 DD , PAP mod increased at 26mmHg.    04/25/2015 Follow up : COPD, O2 dependent, Lung nodule  Pt returns for 2 week follow up .  Was recently seen, CT chest 03/21/15 showed increased mediastinal/bilateral hilar lymphadenopahty worrisome for lymphoma. Unchanged pulmonary nodules x 2 years.  Subsequent PET scan showed intensely hypermetabolic  adenopahty in chest .  She was referred to [facility] , but was too sick w/ COPD exacerbation to proceed  She was started on Keflx and prednisone taper. She is feeling better but feels the prednisone really helped a lot .  Still smoking but has cut back .  advised on smoking cessation. Going to pulmonary rehab. O2 levels dropping less since prednisone taper.  Gets very winded with min act.   Prevnar and PVX and flu vaccines are utd. .   On Symbicort and Spiriva .   On oxygen 3 l/m rest and 4l/m act .   Denies chest pain, orthopnea , increased edema , calf pain, hemoptysis .    Past Medical History  Diagnosis Date  . Hypertension   . COPD (chronic obstructive pulmonary disease) ([place])      FEV-1 in 2008 was 63% with a diffusion capacity of 33%.   . Tobacco abuse      Smokes one pack a day since age 73.  Marland Kitchen History of cervical cancer 1982  . Pulmonary nodule march 2012    53mm RUL and RLL 1st seen march 2012 CT, ?progression 12/2012 CT  . 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 Bronchiectasis march 2012    On CT chest. RUL. Mild  . Medical non-compliance   . Chicken pox   .  Seasonal allergies   . Arrhythmia   . Hyperlipidemia   . Tobacco abuse   . Chronic diastolic heart failure ([place])   . Chronic respiratory failure ([place])     Followed in Pulmonary clinic/ [facility]/ Ramaswamy  - 06/30/2012 desat to 86%  RA walking 50 ft, recovered to 90% at rest - 06/30/2012  Walked 1lpm x 3 laps @ 185 ft each stopped due to  Sob, no desat  rec 02 2lpm with activity and sleeping, ok at rest   . Generalized anxiety disorder   . Leg swelling     Venous doppler right 07/21/12 >>Neg    . MITRAL VALVE PROLAPSE   . Obesity, unspecified   . CHF (congestive heart failure) ([place])    Current Outpatient Prescriptions on File Prior to Visit  Medication Sig Dispense Refill  . albuterol (PROVENTIL) (2.5 MG/3ML) 0.083% nebulizer solution USE 3 ML'S IN NEBULIZER EVERY 6 HOURS AS NEEDED FOR WHEEZING 100 vial 2  . ALPRAZolam (XANAX) 0.5 MG tablet TAKE 1 TABLET TWICE A DAY AS NEEDED FOR ANXIETY 60 tablet 0  . budesonide-formoterol (SYMBICORT) 160-4.5 MCG/ACT inhaler Inhale 2 puffs into the lungs 2 (two) times daily. 3 Inhaler 1  .  cloNIDine (CATAPRES) 0.1 MG tablet Take 1 tablet (0.1 mg total) by mouth 2 (two) times daily. 180 tablet 3  . famotidine (PEPCID) 20 MG tablet Take 1 tablet (20 mg total) by mouth at bedtime. 90 tablet 3  . fluticasone (FLONASE) 50 MCG/ACT nasal spray Place 2 sprays into both nostrils daily as needed for allergies or rhinitis. 16 g 11  . furosemide (LASIX) 40 MG tablet TAKE 1 TABLET (40 MG TOTAL) BY MOUTH DAILY. 90 tablet 1  . losartan (COZAAR) 25 MG tablet Take 1 tablet (25 mg total) by mouth daily. 90 tablet 3  . metoprolol tartrate (LOPRESSOR) 25 MG tablet Take 0.5 tablets (12.5 mg total) by mouth 2 (two) times daily. 90 tablet 3  . omeprazole (PRILOSEC) 20 MG capsule Take 1 capsule (20 mg total) by mouth daily. 90 capsule 3  . simvastatin (ZOCOR) 10 MG tablet Take 2 tablets (20 mg total) by mouth daily at 6 PM. 180 tablet 1  . tiotropium (SPIRIVA HANDIHALER)  18 MCG inhalation capsule PLACE 1 CAPSULE INTO HANDIHALER AND INHALE EVERY DAY 90 capsule 3  . VENTOLIN HFA 108 (90 BASE) MCG/ACT inhaler INHALE 2 PUFFS INTO THE LUNGS EVERY 6 HOURS AS NEEDED 18 Inhaler 2   No current facility-administered medications on file prior to visit.     Review of Systems Constitutional:   No  weight loss, night sweats,  Fevers, chills,  +fatigue, or  lassitude.  HEENT:   No headaches,  Difficulty swallowing,  Tooth/dental problems, or  Sore throat,                No sneezing, itching, ear ache, nasal congestion, post nasal drip,   CV:  No chest pain,  Orthopnea, PND, swelling in lower extremities, anasarca, dizziness, palpitations, syncope.   GI  No heartburn, indigestion, abdominal pain, nausea, vomiting, diarrhea, change in bowel habits, loss of appetite, bloody stools.   Resp:   No chest wall deformity  Skin: no rash or lesions.  GU: no dysuria, change in color of urine, no urgency or frequency.  No flank pain, no hematuria   MS:  No joint pain or swelling.  No decreased range of motion.  No back pain.  Psych:  No change in mood or affect. No depression or anxiety.  No memory loss.         Objective:   Physical Exam  Filed Vitals:   04/25/15 1420  BP: 98/68  Pulse: 91  Temp: 98.4 F (36.9 C)  TempSrc: Oral  Height: 5\' 4"  (1.626 m)  Weight: 223 lb (101.152 kg)  SpO2: 92%    GEN: A/Ox3; pleasant , NAD, obese in wc on O2   HEENT:  [place]/AT,  EACs-clear, TMs-wnl, NOSE-clear, THROAT-clear, no lesions, no postnasal drip or exudate noted.   NECK:  Supple w/ fair ROM; no JVD; normal carotid impulses w/o bruits; no thyromegaly or nodules palpated; no lymphadenopathy.  RESP  Decreased BS in bases no accessory muscle use, no dullness to percussion  CARD:  RRR, no m/r/g  , tr peripheral edema, pulses intact, no cyanosis or clubbing.  GI:   Soft & nt; nml bowel sounds; no organomegaly or masses detected.  Musco: Warm bil, no deformities or  joint swelling noted.   Neuro: alert, no focal deficits noted.    Skin: Warm, no lesions or rashes  Nylee Barbuto NP-C  [facility]  04/25/2015      Assessment & Plan:

## 2015-04-25 NOTE — Assessment & Plan Note (Signed)
Slow to resolve exacerbation -steroid responsive.  Will add low dose steroids briefly to see if can get well enough to proceed with mediastinoscopy.   Plan  Begin Prednisone 20mg  daily for 1 week then 10mg  daily , hold at this dose until seen back in office  Follow up with Dr. [name] in 2-3 weeks and As needed   Please contact office for sooner follow up if symptoms do not improve or worsen or seek emergency care

## 2015-04-25 NOTE — Assessment & Plan Note (Signed)
Compensated on O2  

## 2015-04-25 NOTE — Patient Instructions (Signed)
Begin Prednisone 20mg  daily for 1 week then 10mg  daily , hold at this dose until seen back in office  Follow up with Dr. [name] in 2-3 weeks and As needed   Please contact office for sooner follow up if symptoms do not improve or worsen or seek emergency care

## 2015-04-26 ENCOUNTER — Encounter: Payer: Self-pay | Admitting: Adult Health

## 2015-04-27 ENCOUNTER — Encounter (HOSPITAL_COMMUNITY)
Admission: RE | Admit: 2015-04-27 | Discharge: 2015-04-27 | Disposition: A | Discharge status: Home / Self Care | Payer: Medicare Other | Source: Ambulatory Visit | Attending: Internal Medicine | Admitting: Internal Medicine

## 2015-04-27 VITALS — Wt 226.6 lb

## 2015-04-27 DIAGNOSIS — J439 Emphysema, unspecified: Secondary | ICD-10-CM | POA: Diagnosis not present

## 2015-04-27 DIAGNOSIS — J438 Other emphysema: Secondary | ICD-10-CM

## 2015-04-27 NOTE — Progress Notes (Signed)
Daily Session Note  Patient Details  Name: [name] MRN: [id number] Date of Birth: 1945/06/12 Referring Provider:  Brand Males, MD  Encounter Date: 04/27/2015  Check In:     Session Check In - 04/27/15 1406    Check-In   Location [facility]   Staff Present Rosebud Poles, RN, BSN;Lisa Ysidro Evert, RN;Molly diVincenzo, MS, ACSM RCEP, Exercise Physiologist   Supervising physician immediately available to respond to emergencies [facility] immediately available   Physician(s) Dr. Loleta Books   Medication changes reported     No   Fall or balance concerns reported    No   Warm-up and Cool-down Performed as group-led instruction   Resistance Training Performed Yes   VAD Patient? No   Pain Assessment   Currently in Pain? No/denies   Multiple Pain Sites No      Capillary Blood Glucose: No results found for this or any previous visit (from the past 24 hour(s)).      Exercise Prescription Changes - 04/27/15 1600    Exercise Review   Progression No   Response to Exercise   Blood Pressure (Admit) 104/56 mmHg   Blood Pressure (Exercise) 110/68 mmHg   Blood Pressure (Exit) 112/68 mmHg   Heart Rate (Admit) 83 bpm   Heart Rate (Exercise) 94 bpm   Heart Rate (Exit) 86 bpm   Oxygen Saturation (Admit) 85 %   Oxygen Saturation (Exercise) 86 %   Oxygen Saturation (Exit) 92 %   Rating of Perceived Exertion (Exercise) 11   Perceived Dyspnea (Exercise) 2   Symptoms extreme dyspnea   Duration Progress to 45 minutes of aerobic exercise without signs/symptoms of physical distress   Intensity THRR unchanged   Progression   Progression Continue to progress workloads to maintain intensity without signs/symptoms of physical distress.   Resistance Training   Training Prescription Yes   Weight orange bands   Reps 10-12   Interval Training   Interval Training No   Oxygen   Oxygen Continuous   Liters 4   NuStep   Level 2   Minutes 15   METs 1.2   Arm Ergometer   Level 2   Minutes 15     Goals Met:  Strength training completed today  Goals Unmet:  O2 Sat  Comments: Service time is from 1330 to 1530    Dr. Rush Farmer is Medical Director for Pulmonary Rehab at [facility].

## 2015-05-02 ENCOUNTER — Encounter (HOSPITAL_COMMUNITY)
Admission: RE | Admit: 2015-05-02 | Discharge: 2015-05-02 | Disposition: A | Discharge status: Home / Self Care | Payer: Medicare Other | Source: Ambulatory Visit | Attending: Internal Medicine | Admitting: Internal Medicine

## 2015-05-02 VITALS — Wt 227.1 lb

## 2015-05-02 DIAGNOSIS — J439 Emphysema, unspecified: Secondary | ICD-10-CM | POA: Diagnosis not present

## 2015-05-02 DIAGNOSIS — J438 Other emphysema: Secondary | ICD-10-CM

## 2015-05-02 NOTE — Progress Notes (Signed)
Daily Session Note  Patient Details  Name: [name] MRN: [id number] Date of Birth: [birth year] Referring Provider:  Binnie Rail, MD  Encounter Date: 05/02/2015  Check In:     Session Check In - 05/02/15 1530    Check-In   [facility]   Staff Present Su Hilt, MS, ACSM RCEP, Exercise Physiologist;Lisa Ysidro Evert, RN;Samil Mecham Rollene Rotunda, RN, BSN   Supervising physician immediately available to respond to emergencies [facility] immediately available   Physician(s) Dr. Marily Memos   Medication changes reported     No   Fall or balance concerns reported    No   Warm-up and Cool-down Performed as group-led instruction   Resistance Training Performed Yes   VAD Patient? No   Pain Assessment   Currently in Pain? No/denies      Capillary Blood Glucose: No results found for this or any previous visit (from the past 24 hour(s)).      Exercise Prescription Changes - 05/02/15 1600    Exercise Review   Progression No   Response to Exercise   Blood Pressure (Admit) 100/70 mmHg   Blood Pressure (Exercise) 118/60 mmHg   Blood Pressure (Exit) 98/62 mmHg   Heart Rate (Admit) 90 bpm   Heart Rate (Exercise) 102 bpm   Heart Rate (Exit) 98 bpm   Oxygen Saturation (Admit) 89 %   Oxygen Saturation (Exercise) 87 %   Oxygen Saturation (Exit) 93 %   Rating of Perceived Exertion (Exercise) 13   Perceived Dyspnea (Exercise) 2   Symptoms extreme dyspnes   Duration Progress to 45 minutes of aerobic exercise without signs/symptoms of physical distress   Intensity THRR unchanged   Progression   Progression Continue to progress workloads to maintain intensity without signs/symptoms of physical distress.   Resistance Training   Training Prescription Yes   Weight orange bands   Reps 10-12   Interval Training   Interval Training No   Oxygen   Oxygen Continuous   Liters 4   NuStep   Level 2   Minutes 15   METs 1.5   Arm Ergometer   Level 2   Minutes 15   Track   Laps 5   Minutes 15     Goals Met:  Using PLB without cueing & demonstrates good technique No report of cardiac concerns or symptoms Strength training completed today  Goals Unmet:  PD O2 Sat  Comments: Service time is from 1330 to 1510   Dr. Rush Farmer is Medical Director for Pulmonary Rehab at [facility].

## 2015-05-04 ENCOUNTER — Encounter (HOSPITAL_COMMUNITY)
Admission: RE | Admit: 2015-05-04 | Discharge: 2015-05-04 | Disposition: A | Discharge status: Home / Self Care | Payer: Medicare Other | Source: Ambulatory Visit | Attending: Internal Medicine | Admitting: Internal Medicine

## 2015-05-04 ENCOUNTER — Telehealth: Payer: Self-pay | Admitting: Internal Medicine

## 2015-05-04 VITALS — Wt 226.6 lb

## 2015-05-04 DIAGNOSIS — J439 Emphysema, unspecified: Secondary | ICD-10-CM | POA: Diagnosis not present

## 2015-05-04 DIAGNOSIS — J438 Other emphysema: Secondary | ICD-10-CM

## 2015-05-04 NOTE — Telephone Encounter (Signed)
Called and spoke to pt. Pt states she was at pulmonary rehab and her sats dropped to 85%-88% while on 4lpm. Pt denies any changes/worsening in her breathing and denies the need for recs from DOD. Pt states she would just like MR to be aware.   Will send to MR as FYI.

## 2015-05-04 NOTE — Progress Notes (Signed)
Daily Session Note  Patient Details  Name: Clelia Trabucco MRN: [id number] Date of Birth: [birth year] Referring Provider:    Encounter Date: 05/04/2015  Check In:     Session Check In - 05/04/15 1356    Check-In   [facility]   Staff Present Su Hilt, MS, ACSM RCEP, Exercise Physiologist;Portia Rollene Rotunda, RN, Roque Cash, RN   Supervising physician immediately available to respond to emergencies [facility] immediately available   Physician(s) Dr. Aggie Moats   Medication changes reported     No   Fall or balance concerns reported    No   Warm-up and Cool-down Performed as group-led instruction   Resistance Training Performed Yes   Pain Assessment   Currently in Pain? No/denies      Capillary Blood Glucose: No results found for this or any previous visit (from the past 24 hour(s)).      Exercise Prescription Changes - 05/04/15 1600    Exercise Review   Progression No   Response to Exercise   Blood Pressure (Admit) 88/60 mmHg   Blood Pressure (Exercise) 120/64 mmHg   Blood Pressure (Exit) 104/62 mmHg   Heart Rate (Admit) 92 bpm   Heart Rate (Exercise) 104 bpm   Heart Rate (Exit) 86 bpm   Oxygen Saturation (Admit) 87 %   Oxygen Saturation (Exercise) 86 %   Oxygen Saturation (Exit) 94 %   Rating of Perceived Exertion (Exercise) 13   Perceived Dyspnea (Exercise) 3   Symptoms extreme dyspnea  extreme dyspnea   Duration Progress to 45 minutes of aerobic exercise without signs/symptoms of physical distress   Intensity THRR unchanged   Progression   Progression Continue to progress workloads to maintain intensity without signs/symptoms of physical distress.   Resistance Training   Training Prescription Yes   Weight ornage bands   Reps 10-12   Interval Training   Interval Training No   Oxygen   Oxygen Continuous   Liters 4   Arm Ergometer   Level 2   Minutes 15   Track   Laps 3   Minutes 15     Goals Met:  No report of cardiac  concerns or symptoms Strength training completed today  Goals Unmet:  Not Applicable  Comments: Service time is from 1330 to 1530    Dr. Rush Farmer is Medical Director for Pulmonary Rehab at [facility].

## 2015-05-07 NOTE — Telephone Encounter (Signed)
[name]  When she comes 4/21/1 have her walk on 4L [name] in office with forehead probe  Thanks  Dr. Brand Males, M.D., [facility]C.P Pulmonary and Critical Care Medicine Staff Physician [facility] Pager: [phone], If no answer or between  15:00h - 7:00h: call [phone]  05/07/2015 3:52 PM

## 2015-05-08 ENCOUNTER — Ambulatory Visit: Payer: Medicare Other | Admitting: Adult Health

## 2015-05-08 NOTE — Telephone Encounter (Signed)
Note made in pt's appt notes for 05/12/15. Will sign off.

## 2015-05-09 ENCOUNTER — Encounter (HOSPITAL_COMMUNITY): Payer: Medicare Other

## 2015-05-11 ENCOUNTER — Encounter (HOSPITAL_COMMUNITY)
Admission: RE | Admit: 2015-05-11 | Discharge: 2015-05-11 | Disposition: A | Discharge status: Home / Self Care | Payer: Medicare Other | Source: Ambulatory Visit | Attending: Internal Medicine | Admitting: Internal Medicine

## 2015-05-11 VITALS — Wt 227.7 lb

## 2015-05-11 DIAGNOSIS — J438 Other emphysema: Secondary | ICD-10-CM

## 2015-05-11 DIAGNOSIS — J439 Emphysema, unspecified: Secondary | ICD-10-CM | POA: Diagnosis not present

## 2015-05-11 NOTE — Progress Notes (Signed)
Daily Session Note  Patient Details  Name: [name] MRN: [id number] Date of Birth: 04/30/1945 Referring Provider:    Encounter Date: 05/11/2015  Check In:     Session Check In - 05/11/15 1355    Check-In   [facility]   Staff Present Joan Behrens, RN, BSN;Lisa Hughes, RN;Portia Payne, RN, BSN;Molly diVincenzo, MS, ACSM RCEP, Exercise Physiologist   Supervising physician immediately available to respond to emergencies [facility] immediately available   Physician(s) Dr. Merrell   Medication changes reported     No   Fall or balance concerns reported    No   Warm-up and Cool-down Performed as group-led instruction   Resistance Training Performed Yes   VAD Patient? No   Pain Assessment   Currently in Pain? No/denies   Multiple Pain Sites No      Capillary Blood Glucose: No results found for this or any previous visit (from the past 24 hour(s)).      Exercise Prescription Changes - 05/11/15 1600    Response to Exercise   Blood Pressure (Admit) 130/60 mmHg   Blood Pressure (Exercise) 100/60 mmHg   Blood Pressure (Exit) 104/60 mmHg   Heart Rate (Admit) 84 bpm   Heart Rate (Exercise) 99 bpm   Heart Rate (Exit) 93 bpm   Oxygen Saturation (Admit) 86 %   Oxygen Saturation (Exercise) 84 %   Oxygen Saturation (Exit) 89 %   Rating of Perceived Exertion (Exercise) 10   Perceived Dyspnea (Exercise) 2   Symptoms extreme dyspnea   Duration Progress to 45 minutes of aerobic exercise without signs/symptoms of physical distress   Intensity THRR unchanged   Progression   Progression Continue to progress workloads to maintain intensity without signs/symptoms of physical distress.   Resistance Training   Training Prescription Yes   Weight ornage bands   Reps 10-12   Interval Training   Interval Training No   Oxygen   Oxygen Continuous   Liters 4   NuStep   Level 2   Minutes 30   METs 1.6     Goals Met:  Using PLB without cueing &  demonstrates good technique No report of cardiac concerns or symptoms Strength training completed today  Goals Unmet:  PD  Comments: Service time is from 1330 to 1530   Dr. Wesam G. Yacoub is Medical Director for Pulmonary Rehab at [facility]. 

## 2015-05-12 ENCOUNTER — Encounter: Payer: Self-pay | Admitting: Internal Medicine

## 2015-05-12 ENCOUNTER — Ambulatory Visit (INDEPENDENT_AMBULATORY_CARE_PROVIDER_SITE_OTHER): Payer: Medicare Other | Admitting: Internal Medicine

## 2015-05-12 ENCOUNTER — Other Ambulatory Visit (INDEPENDENT_AMBULATORY_CARE_PROVIDER_SITE_OTHER): Payer: Medicare Other

## 2015-05-12 VITALS — BP 104/68 | HR 102 | Ht 64.0 in | Wt 225.0 lb

## 2015-05-12 DIAGNOSIS — R590 Localized enlarged lymph nodes: Secondary | ICD-10-CM

## 2015-05-12 DIAGNOSIS — J9611 Chronic respiratory failure with hypoxia: Secondary | ICD-10-CM

## 2015-05-12 DIAGNOSIS — R0902 Hypoxemia: Secondary | ICD-10-CM | POA: Diagnosis not present

## 2015-05-12 DIAGNOSIS — R0602 Shortness of breath: Secondary | ICD-10-CM

## 2015-05-12 DIAGNOSIS — R599 Enlarged lymph nodes, unspecified: Secondary | ICD-10-CM | POA: Diagnosis not present

## 2015-05-12 LAB — BASIC METABOLIC PANEL
BUN: 9 mg/dL (ref 6–23)
CALCIUM: 9.7 mg/dL (ref 8.4–10.5)
CHLORIDE: 103 meq/L (ref 96–112)
CO2: 32 meq/L (ref 19–32)
Creatinine, Ser: 0.93 mg/dL (ref 0.40–1.20)
GFR: 77.1 mL/min (ref >60.00)
GLUCOSE: 133 mg/dL — AB (ref 70–99)
POTASSIUM: 4.4 meq/L (ref 3.5–5.1)
SODIUM: 141 meq/L (ref 135–145)

## 2015-05-12 NOTE — Progress Notes (Signed)
Subjective:     Patient ID: [name], female   DOB: [birth year], 70 y.o.   MRN: [id number]  HPI  70 yo female with COPD ,O2 dependent,   TEST  FEV-1 in 2008 was 63% with a diffusion capacity of 33% PFTs 78/14 shows gold stage 3 copd with fev1 1.12L/49%, FVC 2.01L/70% - post bd parameters. No bd response, Ratio 60/77%. TLC 87%, DLCO 5/21% Duplex LE - 07/21/12 - negative. BNP 07/16/12 - 25  2 D echo on 03/08/15 showed EF 60-65%, Grade 1 DD , PAP mod increased at 90mmHg.    Chief Complaint  Patient presents with  . Hosptial Follow up    D/C on 03/14/13- Increased sob with exertion.  Reports not sleeping well over past two and needs refill today on her medications    OV April 2012: Obese female hospitalized in March 2012 and now following. 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 OVerall doing much better since discharge. Using o2. No insurance. Unable to afford xopenex and pulmicort neb. Able to afford atroven neb. Wanting med change. Dysponea is at baseline class 3. Reportedly cut down on smoking to 1 cig/day. Mild pedal edema improving. Admits to snoring and excess day time somnolence   FEV-1 in 2008 was 63% with a diffusion capacity of 33%   #COPD  - due to cost stop taking pulmicort neb and xopenex neb  - your new regimen is atrovent neb 4 times daily., QVAR inhaler 2 puff bid, albuterol inhaler 2 puff as needed (take samples and show technique)  - use oxygen all 24 hours (we will Hold off on portable system due to cost issues)  # sleep apnea  - you probably have this  - have sleep study and fu with Dr. Elsworth Soho or Dr Halford Chessman  #Smoking  - please quit  #CT lung nodule and node  - we will do CT chest in 6 months  #Followup  - 2 months or sooner if needed  -  if new meds are costly please call us soon  - please see $ counsillor soon   OV 11/12/10: Followup a) COPD; b) smoking; c) lung nodule; and d) possible sleep apnea. COPD is stable. Using mdi and nebs. Still smoking.  Wants to do e-cigs and is asking infor about it. Had sleep study in May 2012: AHI 1.8, PLM 27 but denies restless legs. $ issues and did not fu with Dr Halford Chessman. Says she is finding it tough to pay bills for sleep apnea. Has not had CT chest as fu for nodule; cost issue again. Does not qualify for medicaid. Medicare few years away. Does not have private health care. Goes to [employer]  Past, Family and Social: no change    #sleep study  - you just need to use oxygen with sleep  - you do not need cpap machine  #Smoking  - please quit smoking  - at this point research does not recommend e-cigarette  #COPD  - you use your nebulizers as scheduled  #lung nodule  - you need followup CT chest in next 3-6 months (with contrast)  - meet with my manager to find out cost esp for [facility] patients  #Followup  - 6 months  - flu shot today   OV 06/22/2012  Followup a) COPD; b) smoking;  c) lung nodule;  Have not seen her since October 2012. She only now got her Medicare in April 2015 and therefore made this appointment. Overall she says she was stable but now for the past 3 weeks is having increased cough, wheezing, chest tightness, increased white color phlegm. She is not using her medications correctly. She is finding nebulizers to much of a hassle to use. She does use her Qvar but I'm not sure of her compliance. COPD cat score is 35.  In terms of smoking: She continues to smoke conventional tobacco. The office smells of tobacco. She's not sure she will be interested in quitting  In terms of lung nodule: It has been over 2 years since she had her last CT scan of the chest. She never wanted to have a CT scan of the chest because she had to pay for it out of pocket. But now that she has  Medicare she is willing to have the CT scan of the chest  rec #COPD flareup - You are having a COPD flare up right now -  take levaquin 500mg  once daily  X 5 days - Please Please take prednisone 40 mg x1 day, then 30 mg x1 day, then 20 mg x1 day, then 10 mg x1 day, and then 5 mg x1 day and stop  #COPD - Continue Qvar 80 mcg 2 puff 2 times a day - Try ANORO 1 puff once daily  - this medication is a trial only   - learn technique from my CMA  #Lung nodule  = have followup CT chest - 2 year followup  #Smoking  - please quit  #Followup  - full PFT in 5 weeks - return after ct chest in 5 weeks   06/30/2012 f/u ov/Wert still smoking Chief Complaint  Patient presents with  . Acute Visit    Pt states breathing is no better since visit here on 06/22/12. Still having alot of SOB and prod cough and mucus gets hung in her throat.   not using 02 daytime x after gets tired and sits down, has only tried the neb x one and it did help. Mucus is clear >>rx Dulera, stopped QVAR and Anoro    CT chest >previously seen right lung nodules are no longer  demonstrated. The third nodule is stable. Progressive mediastinal adenopathy.  Changes of COPD and chronic bronchitis  07/16/2012 Acute OV  Complains of increased SOB, and worsening LE edema x2 days Still smoking.  Wearing O2 2 l/m with act and At bedtime   Takes O2 off to smoke. Advised on smoking cessation and dangers of smoking Started on Dulera last ov , Anoro and QVAR were stopped. .  Was not able to start PPI and pepcid , wants an rx.  Wants something for anxiety. Ran out xanax , can not afford to go back to psychiatry.  No chest pain, syncope, fever, cough or n/v.    REC Continue on Dulera 2 puffs Twice daily   Begin Prilosec 20mg  daily before meal  Begin Pepcid 20mg  At bedtime   Xanax 0.5mg  Twice daily  As needed  Anxiety - will need to get refills thru family MD  NO SMOKING- at all especially in home where oxygen tanks/machine is  present.  Keep legs elevated.  Take Lasix 20mg  2 tabs daily for 3 days then 1 tab daily  We are setting you up for an ultrasound of your right leg .  Please contact office for sooner follow up  if symptoms do not improve or worsen or seek emergency care  follow up Dr. [name] in 2 weeks and As neede  OV 07/28/2012  FU  1) Lung nodule : had 2 year fu CT - see below. I personally reviewed this scan  Ct chest 06/25/12 IMPRESSION:  1. The previously seen right lung nodules are no longer  demonstrated. The third nodule is stable. This could be further  evaluated with a repeat chest CT without contrast in 9-12 months.  2. Progressive mediastinal adenopathy. - largest to me looks1mm 3. Changes of COPD and chronic bronchitis.  4. Linear scarring at the left lung base.  5. Diffuse hepatic steatosis.  Original Report Authenticated By: Claudie Revering, M.D   2) COPD - stable diseaes. COPD Cat score is 23 and reflects significant symptoms. Says rehab still has not called her. She is interested. PFTs 78/14 shows gold stage 3 copd with  fev1 1.12L/49%, FVC 2.01L/70% - post bd parameters.  No bd response, Ratio 60/77%. TLC 87%, DLCO 5/21%   3) new RLE pedal edema:   Duplex LE - 07/21/12 - negative. BNP 07/16/12 - 25 and bmet normal. Apparently Dr Melvyn Novas has given her lasix which is helping. She is frustrated she does not know etiology. She has no PCP   Oct 2014 hosp in [place] with chf ? Related to hypoxemia > 3lpm but weaned it to 2lpm   02/16/2013 f/u ov/Wert re: sob/ referred by rehab where she arrived per her usual routine Chief Complaint  Patient presents with  . Acute Visit    Pt c/o increased SOB with any exertion, chest congestion, nonprod cough.  much better on prednisone one week prior to OV  And last dose was 02/12/13 and worse again since. Confused with when/ how to use her various inhalers/ nebs  No obvious day to day or daytime variabilty or assoc   cp or chest tightness, subjective wheeze  overt sinus or hb symptoms. No unusual exp hx or h/o childhood pna/ asthma or knowledge of premature birth.  Sleeping ok without nocturnal  or early am exacerbation  of respiratory  c/o's or need for noct saba. Also denies any obvious fluctuation of symptoms with weather or environmental changes or other aggravating or alleviating factors except as outlined above    OV  05/24/2013  Chief Complaint  Patient presents with  . Hosptial Follow up    D/C on 03/14/13- Increased sob with exertion.  Reports not sleeping well over past two and needs refill today on her medications   Recent hospitalization in for  for COPD exacerbation followed by  inpatient rehabilitation. She feels he did not get adequate rehabilitation at the rehabilitation facility. She is getting home physical therapy. Nevertheless she says has lot of fatigue since discharge but  denies chest pain or orthopnea or edema. She continues to use oxygen. She is frustrated by the level of fatigue. She does acknowledge that pulmonary rehabilitation helped her in the past. She's really keen on going the etiology of her fatigue and getting to the bottom of it. She wants portable o2 system  Last echocardiogram March 2008 was normal with an ejection fraction of 65%  TSH August 2014 was normal  Last CT scan of the chest December 2014: No vomiting was in but had small pulmonary nodules which require followup in 2015 December  BNP February 2015: Is normal   ABG February  2015 did show some mild hypercapnia with a PCO2 of 49  Blood work  February 2015 shows hemoglobin 11 g percent and a normal creatinine of 1.09 mg percent with a GFR of 60   VIt D - she denies having had it but lab order gives hard stop saying she has had this tested several times  Dg Chest 2 View  05/24/2013   CLINICAL DATA:  Follow-up pneumonia  EXAM: CHEST  2 VIEW  COMPARISON:  03/14/2013, 01/11/2013, and 08/26/2012  FINDINGS: Chronic interstitial markings/emphysematous  changes. Superimposed scarring/fibrosis in the lateral right upper lobe and lingula. No focal consolidation. No pleural effusion or pneumothorax.  Mild cardiomegaly.  Degenerative changes of the visualized thoracolumbar spine.  IMPRESSION: No evidence of acute cardiopulmonary disease.  Emphysematous changes with superimposed scarring/fibrosis, as above.   Electronically Signed   By: Julian Hy M.D.   On: 05/24/2013 14:39      OV 10/18/2014  Chief Complaint  Patient presents with  . Follow-up    Pt states her breathing is unchanged since last OV.  Pt c/o prod cough in morning with cloudy mucus, pt states the mucus becomes clear throughout the day.    Follow-up  COPD with chronic respiratory failure: It has been over a year since I saw [name] . At that time she had significant fatigue which is struggled to address. I referred her to pulmonary rehabilitation but apparently she was turned away. She tells me that she was diagnosed with anemia. Review of the results in the computer show hemoglobin was 10 g percent 07/20/2013. After that apparently according to her history she went on iron pills and by July 2016 in October 2015 hemoglobin rose up to 15 g percent and this fatigue resolved. She says because she was feeling well she is not coming here to review her COPD status. Currently here for refills. Feels well as she can be and is in stable health.  Smoking: She continues to smoke. Finds it difficult to quit  Lung nodules: She was supposed to have a CT scan in December 2015 but obviously she did not follow-up for that. She needs a repeat CT scan      03/10/2015 Follow up : COPD, O2 dependent, Lung nodule  Pt returns for follow up . Had suspected flare 2 weeks ago with increased cough and dyspnea.  She is going to pulmonary rehab. O2 sats have been dropping during exercise.  She was given a steroid taper and abx .  Does feel some better but sill has DOE. Cough is better.  Set up  for a 2 D echo on 03/08/15 showed EF 60-65%, Grade 1 DD , PAP mod increased at 54mmHg.  Says she was checked for sleep apnea but was told she did not have sleep apnea. This was >42yrs ago and weight was about 10lbs lighter.  Still smoking , advised on smoking cessation. Has cut back . Was  CT chest was not done as recommended to follow lung nodule, discussed setting this up soon.   Prevnar and PVX and flu vaccines are utd. .   On Symbicort and Spiriva .   On oxygen 3 l/m rest and 4l/m act .   Denies chest pain, orthopnea , increased edema , calf pain, hemoptysis .     OV 04/04/2015  Chief Complaint  Patient presents with  . Follow-up    Pt here after PET scan to review results. Pt states her breathing is unchanged since OV with TP - pt states her SOB is not at baseline. Pt c/o prod cough with tan  mucus.     70 year old female with above medical problems presents for follow-up after PET scan. After my last visit in September 2016 out at a CT scan of the chest as part of routine evaluation. She finally got it done in end of February 2017. This showed prominent increase in mediastinal adenopathy. She finally had a PET scan which I personally visualized and this shows prominent bilateral hilar adenopathy and mediastinal adenopathy along with axillary adenopathy with high uptake SUV. In addition there is fatty liver, peri-umbilicus hernia and also some renal stones. She is asymptomatic from these. She is now here with his sister [name] to discuss these results.  rec -refer CVTS  04/25/2015 Follow up : COPD, O2 dependent, Lung nodule  Pt returns for 2 week follow up . By Was recently seen, CT chest 03/21/15 showed increased mediastinal/bilateral hilar lymphadenopahty worrisome for lymphoma. Unchanged pulmonary nodules x 2 years.  Subsequent PET scan showed intensely hypermetabolic  adenopahty in chest .  She was referred to [facility] , but was too sick w/ COPD exacerbation to proceed  She was  started on Keflx and prednisone taper. She is feeling better but feels the prednisone really helped a lot .  Still smoking but has cut back .  advised on smoking cessation. Going to pulmonary rehab. O2 levels dropping less since prednisone taper.  Gets very winded with min act.   Prevnar and PVX and flu vaccines are utd. .   On Symbicort and Spiriva .   On oxygen 3 l/m rest and 4l/m act .   Denies chest pain, orthopnea , increased edema , calf pain, hemoptysis .    OV 05/12/2015  Chief Complaint  Patient presents with  . Follow-up    Pt c/o increase in SOB. Pt states when she was on 20mg  pred she was feeling better. Pt states her cough is improving. Pt denies CP/tightness. Pt states her spO2 drops to the 70's when on 4lpm at pulm rehab.    Follow-up COPD with chronic hypoxemic respiratory failure associated with cor pulmonale and new mediastinal adenopathy that is PET positive spring 2017 -    -I personally saw her approximately 2 months ago. At that time I referred her to thoracic surgery for evaluation of her new onset PET-positive mediastinal adenopathy. However she was in COPD exacerbation and with her chronic hypoxemic respiratory failure Dr. Pia Mau deferred surgical evaluation. She subsequent he followed up with my nurse practitioner. She was complaining of worsening hypoxemia. Patient was started on a prednisone burst at 20 mg per day. This is now being tapered to 10 mg per day. She is currently on 10 mg per day.  - She tells me that she's been on oxygen 2 L approximately since 2012. Then in 2015 after a visit to [place] she has been on oxygen 3 L. She's been stable on this regimen intermittently at rehabilitation they would increase it to 4 L. However in the last 3 months or so she is having worsening hypoxemia and desaturations with exercise. And even higher levels of oxygen are needed to keep her away from desaturating. She believes that prednisone helps her. The prednisone gives  her more energy. However she feels a lowest threshold dose of prednisone 20 mg.  There is no objective evidence that prednisone helps her but subjectively does because it allows her to do more activities of daily living at a significantly higher level. At this point in time she and her sister [name] are indicating a desire  to have higher level of prednisone.  Following lab tests were reviewed again  Last pulmonary embolus was ruled out was in December 2014 with a CT anginal chest   Echocardiogram February 2017 with cor pulmonale elevated pulmonary artery systolic pressure of 51 mm but normal left ventricle ejection fraction  Blood work February 2017 with a normal creatinine of 0.9 mg percent. Normal hemoglobin and December 2016 of 15 g percent.   PET scan March 2017: With hypermetabolic lymphadenopathy in mediastinum worrisome for lymphoma - .> surgery postpone by Dr Toma Deiters due to ongoing aecopd and risk  Walk test in office 05/12/2015 > on 3 L baseline oxygen walking 185 feet 3 laps: She only walk one lap and then she desaturated to 86%. At this point her oxygen was increased to 6 L and then she walk one lap and maintained a pulse ox of 90%. After that she was dyspneic and did not want walk further.      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).   reports that she has been smoking Cigarettes.  She has a 22 pack-year smoking history. She has never used smokeless tobacco.  Past Surgical History   Procedure Laterality Date  . Total abdominal hysterectomy      Allergies  Allergen Reactions  . Ambien [Zolpidem Tartrate] Other (See Comments)    Causes nightmares    Immunization History  Administered Date(s) Administered  . Influenza Split 11/12/2010  . Influenza, High Dose Seasonal PF 11/09/2012  . Influenza,inj,Quad PF,36+ Mos 10/20/2014  . Pneumococcal Conjugate-13 05/17/2013  . Pneumococcal Polysaccharide-23 03/22/2010    Family History  Problem Relation Age of Onset  . Other Father     MVA  . Esophageal cancer Mother      Current outpatient prescriptions:  .  albuterol (PROVENTIL) (2.5 MG/3ML) 0.083% nebulizer solution, USE 3 ML'S IN NEBULIZER EVERY 6 HOURS AS NEEDED FOR WHEEZING, Disp: 100 vial, Rfl: 2 .  ALPRAZolam (XANAX) 0.5 MG tablet, TAKE 1 TABLET TWICE A DAY AS NEEDED FOR ANXIETY, Disp: 60 tablet, Rfl: 0 .  budesonide-formoterol (SYMBICORT) 160-4.5 MCG/ACT inhaler, Inhale 2 puffs into the lungs 2 (two) times daily., Disp: 3 Inhaler, Rfl: 1 .  cloNIDine (CATAPRES) 0.1 MG tablet, Take 1 tablet (0.1 mg total) by mouth 2 (two) times daily., Disp: 180 tablet, Rfl: 3 .  famotidine (PEPCID) 20 MG tablet, Take 1 tablet (20 mg total) by mouth at bedtime., Disp: 90 tablet, Rfl: 3 .  fluticasone (FLONASE) 50 MCG/ACT nasal spray, Place 2 sprays into both nostrils daily as needed for allergies or rhinitis., Disp: 16 g, Rfl: 11 .  furosemide (LASIX) 40 MG tablet, TAKE 1 TABLET (40 MG TOTAL) BY MOUTH DAILY., Disp: 90 tablet, Rfl: 1 .  losartan (COZAAR) 25 MG tablet, Take 1 tablet (25 mg total) by mouth daily., Disp: 90 tablet, Rfl: 3 .  metoprolol tartrate (LOPRESSOR) 25 MG tablet, Take 0.5 tablets (12.5 mg total) by mouth 2 (two) times daily., Disp: 90 tablet, Rfl: 3 .  omeprazole (PRILOSEC) 20 MG capsule, Take 1 capsule (20 mg total) by mouth daily., Disp: 90 capsule, Rfl: 3 .  predniSONE (DELTASONE) 10  MG tablet, Take 20mg  daily for 1 week then 10mg  daily , hold at this  dose, Disp: 37 tablet, Rfl: 1 .  simvastatin (ZOCOR) 10 MG tablet, Take 2 tablets (20 mg total) by mouth daily at 6 PM., Disp: 180 tablet, Rfl: 1 .  tiotropium (SPIRIVA HANDIHALER) 18 MCG inhalation capsule, PLACE 1 CAPSULE INTO HANDIHALER AND INHALE EVERY DAY, Disp: 90 capsule, Rfl: 3 .  VENTOLIN HFA 108 (90 BASE) MCG/ACT inhaler, INHALE 2 PUFFS INTO THE LUNGS EVERY 6 HOURS AS NEEDED, Disp: 18 Inhaler, Rfl: 2      Review of Systems     Objective:   Physical Exam Filed Vitals:   05/12/15 1512 05/12/15 1514  BP: 104/68   Pulse: 97 102  Height: 5\' 4"  (1.626 m)   Weight: 225 lb (102.059 kg)   SpO2: 92% 81%   General exam: Obese female sitting on the wheelchair Respiratory exam: Clear to auscultation bilaterally. No wheeze no crackles but overall air entry diminished Cardiovascular: No elevated JVP. Regular rate and rhythm. Normal heart sounds HEENT: Oxygen on. Mallampati class II Abdomen: Obese nontender no organomegaly Extremity: No cyanosis no clubbing trace pedal edema present Skin: Is dry and intact in the exposed areas. Musculoskeletal: Deconditioned looking      Assessment:       ICD-9-CM ICD-10-CM   1. Chronic respiratory failure with hypoxia (HCC) 518.83 J96.11    799.02    2. Mediastinal adenopathy 785.6 R59.9   3. Shortness of breath 786.05 R06.02        Plan:     She clearly has worsening hypoxemia in the last several months. This correlates with new onset at positive mediastinal adenopathy that is in need of surgical lung biopsy. The cause of the mediastinal adenopathy is unknown. It is also unclear of the mediastinal adenopathy that is temporally associated with worsening hypoxemia is associated with this etiologically. Prednisone seems to help with subjectively but there is no objective evidence yet that it helps her. If it does it might be because of controlling me to several inflammation. At this point I'm really not sure what is going on and why she is  having worsening hypoxemia and whether this is related to medicinal adenopathy or cor pulmonale or may be a chronic pulmonary embolism cardiovascular diastolic heart failure.Clinically she appears dry  Plan - She needs evaluation surgically of the mediastinal adenopathy but currently high risk for surgical lung biopsy - Therefore we decided to evaluate. We will attempt to rule out pulmonary embolism getting a d-dimer first. -If  the d-dimer is possible get a CT [place] which also helped with medicinal adenopathy. - We'll also increase prednisone 40 mg a day maintenance and we will reassess her with walking desaturation test at follow-up to see if this is helping objectively if not at least subjectively.  She agrees with the plan. She needs periodic post follow-up we'll see her again in 10-14 days.   > 50% of this > 25 min visit spent in face to face counseling or coordination of care    Dr. Brand Males, M.D., [facility].C.P Pulmonary and Critical Care Medicine Staff Physician [facility] Pager: [phone], If no answer or between  15:00h - 7:00h: call [phone]  05/12/2015 4:22 PM

## 2015-05-12 NOTE — Patient Instructions (Addendum)
ICD-9-CM ICD-10-CM   1. Chronic respiratory failure with hypoxia (HCC) 518.83 J96.11    799.02    2. Mediastinal adenopathy 785.6 R59.9   3. Shortness of breath 786.05 R06.02    Not clear why  You are worsening and why prednisone helps you Unclear if prednisone help is subjective or objective as well Still at high risk for  Post op complication for surgery for mediastinal adenopathy  Plan - do bmet and d-dimer blood test - if d-dimer blood test high will order t CT chest with contrast - angiogram PE protocol - increase prednisone to 40mg  per day - continue lasix and other meds - use o2 4-6L [place] during exertion - keep pulse ox > 88%  Followup  -10-14 days to see me or an NP to see if higher prednisone working well  - will need walk test on 4L [place] to see if objectively prednisone woirking well

## 2015-05-13 ENCOUNTER — Encounter: Payer: Self-pay | Admitting: Internal Medicine

## 2015-05-13 ENCOUNTER — Telehealth: Payer: Self-pay | Admitting: Internal Medicine

## 2015-05-13 LAB — D-DIMER, QUANTITATIVE (NOT AT ARMC): D DIMER QUANT: 0.27 ug{FEU}/mL (ref 0.00–0.48)

## 2015-05-13 NOTE — Telephone Encounter (Signed)
D-dimer is normal. So we need not do CTA now but will reassess response to steroids and then decide on CTA. WHen is her fu?

## 2015-05-15 MED ORDER — PREDNISONE 10 MG PO TABS: 40.0000 mg | ORAL_TABLET | Freq: Every day | ORAL | Status: DC

## 2015-05-15 NOTE — Telephone Encounter (Signed)
Called and spoke to pt. Informed her of the results and recs per MR. Pt verbalized understanding and states she feels she is improving. Pt has an appt with MR on 06/02/15.

## 2015-05-16 ENCOUNTER — Encounter (HOSPITAL_COMMUNITY)
Admission: RE | Admit: 2015-05-16 | Discharge: 2015-05-16 | Disposition: A | Discharge status: Home / Self Care | Payer: Medicare Other | Source: Ambulatory Visit | Attending: Internal Medicine | Admitting: Internal Medicine

## 2015-05-16 VITALS — Wt 227.7 lb

## 2015-05-16 DIAGNOSIS — J439 Emphysema, unspecified: Secondary | ICD-10-CM | POA: Diagnosis not present

## 2015-05-16 DIAGNOSIS — J438 Other emphysema: Secondary | ICD-10-CM

## 2015-05-16 NOTE — Telephone Encounter (Signed)
Ok thanks. Will need to repeat walk test her while on prednisone . Will close msg

## 2015-05-16 NOTE — Progress Notes (Signed)
Daily Session Note  Patient Details  Name: [name] MRN: [id number] Date of Birth: [birth year] Referring Provider:    Encounter Date: 05/16/2015  Check In:     Session Check In - 05/16/15 1330    Check-In   Location [facility]   Staff Present Rosebud Poles, RN, BSN;Molly diVincenzo, MS, ACSM RCEP, Exercise Physiologist;Kori Colin Rollene Rotunda, RN, BSN   Supervising physician immediately available to respond to emergencies [facility] immediately available   Physician(s) Dr. Marily Memos   Medication changes reported     No   Fall or balance concerns reported    No   Warm-up and Cool-down Performed as group-led instruction   Resistance Training Performed Yes   VAD Patient? No   Pain Assessment   Currently in Pain? No/denies   Multiple Pain Sites No      Capillary Blood Glucose: No results found for this or any previous visit (from the past 24 hour(s)).      Exercise Prescription Changes - 05/16/15 1522    Exercise Review   Progression Yes   Response to Exercise   Blood Pressure (Admit) 100/60 mmHg   Blood Pressure (Exercise) 106/62 mmHg   Blood Pressure (Exit) 98/50 mmHg   Heart Rate (Admit) 88 bpm   Heart Rate (Exercise) 96 bpm   Heart Rate (Exit) 88 bpm   Oxygen Saturation (Admit) 86 %   Oxygen Saturation (Exercise) 88 %   Oxygen Saturation (Exit) 96 %   Rating of Perceived Exertion (Exercise) 11   Perceived Dyspnea (Exercise) 1   Symptoms extreme dyspnea   Duration Progress to 45 minutes of aerobic exercise without signs/symptoms of physical distress   Intensity THRR unchanged   Progression   Progression Continue to progress workloads to maintain intensity without signs/symptoms of physical distress.   Resistance Training   Training Prescription Yes   Weight ornage bands   Reps 10-12   Interval Training   Interval Training No   Oxygen   Oxygen Continuous   Liters 6   NuStep   Level 2   Minutes 15   METs 1.5   Arm Ergometer   Level 2   Minutes 15   Track   Laps 5   Minutes 15     Goals Met:  Using PLB without cueing & demonstrates good technique No report of cardiac concerns or symptoms Strength training completed today  Goals Unmet:  NA  Comments: Service time is from 1330 to 1455, Patient had a "better day" with exercise today. Her pulmonologist has increased her daily dose of prednisone and she states she feels much better and can breath better.   Dr. Rush Farmer is Medical Director for Pulmonary Rehab at [facility].

## 2015-05-18 ENCOUNTER — Encounter (HOSPITAL_COMMUNITY): Payer: Medicare Other

## 2015-05-23 ENCOUNTER — Encounter (HOSPITAL_COMMUNITY): Payer: Medicare Other

## 2015-05-23 ENCOUNTER — Other Ambulatory Visit: Payer: Self-pay | Admitting: Internal Medicine

## 2015-05-24 NOTE — Telephone Encounter (Signed)
RX faxed to [facility]

## 2015-05-25 ENCOUNTER — Encounter (HOSPITAL_COMMUNITY): Payer: Medicare Other

## 2015-05-30 ENCOUNTER — Encounter (HOSPITAL_COMMUNITY)
Admission: RE | Admit: 2015-05-30 | Discharge: 2015-05-30 | Disposition: A | Discharge status: Home / Self Care | Payer: Medicare Other | Source: Ambulatory Visit | Attending: Internal Medicine | Admitting: Internal Medicine

## 2015-05-30 VITALS — Wt 226.9 lb

## 2015-05-30 DIAGNOSIS — J439 Emphysema, unspecified: Secondary | ICD-10-CM | POA: Insufficient documentation

## 2015-05-30 DIAGNOSIS — J438 Other emphysema: Secondary | ICD-10-CM

## 2015-05-30 NOTE — Progress Notes (Signed)
Daily Session Note  Patient Details  Name: [name] MRN: [id number] Date of Birth: [birth year] Referring Provider:    Encounter Date: 05/30/2015  Check In:     Session Check In - 05/30/15 1604    Check-In   Location [facility]   Staff Present Rosebud Poles, RN, Luisa Hart, RN, BSN;Molly diVincenzo, MS, ACSM RCEP, Exercise Physiologist   Supervising physician immediately available to respond to emergencies [facility] immediately available   Physician(s) Dr. Marily Memos   Medication changes reported     No   Fall or balance concerns reported    No   Warm-up and Cool-down Performed as group-led instruction   Resistance Training Performed Yes   VAD Patient? No   Pain Assessment   Currently in Pain? No/denies   Multiple Pain Sites No      Capillary Blood Glucose: No results found for this or any previous visit (from the past 24 hour(s)).      Exercise Prescription Changes - 05/30/15 1600    Response to Exercise   Blood Pressure (Admit) 110/60 mmHg   Blood Pressure (Exercise) 104/54 mmHg   Blood Pressure (Exit) 108/60 mmHg   Heart Rate (Admit) 99 bpm   Heart Rate (Exercise) 113 bpm   Heart Rate (Exit) 105 bpm   Oxygen Saturation (Admit) 92 %   Oxygen Saturation (Exercise) 88 %   Oxygen Saturation (Exit) 95 %   Rating of Perceived Exertion (Exercise) 11   Perceived Dyspnea (Exercise) 1   Symptoms extreme dyspnea   Comments this was a normal day for her   Duration Progress to 45 minutes of aerobic exercise without signs/symptoms of physical distress   Intensity THRR unchanged   Progression   Progression Continue to progress workloads to maintain intensity without signs/symptoms of physical distress.   Resistance Training   Training Prescription Yes   Weight ornage bands   Reps 10-12   Interval Training   Interval Training No   Oxygen   Oxygen Continuous   Liters 6   NuStep   Level 2   Minutes 15   METs 1.5   Arm Ergometer   Level 2    Minutes 15   Track   Laps 6   Minutes 15     Goals Met:  Queuing for purse lip breathing Strength training completed today  Goals Unmet:  Not Applicable  Comments: Service time is from 1330 to [place]    Dr. Rush Farmer is Medical Director for Pulmonary Rehab at [facility].

## 2015-06-01 ENCOUNTER — Encounter (HOSPITAL_COMMUNITY)
Admission: RE | Admit: 2015-06-01 | Discharge: 2015-06-01 | Disposition: A | Discharge status: Home / Self Care | Payer: Medicare Other | Source: Ambulatory Visit | Attending: Internal Medicine | Admitting: Internal Medicine

## 2015-06-01 VITALS — Wt 225.1 lb

## 2015-06-01 DIAGNOSIS — J441 Chronic obstructive pulmonary disease with (acute) exacerbation: Secondary | ICD-10-CM

## 2015-06-01 DIAGNOSIS — J438 Other emphysema: Secondary | ICD-10-CM

## 2015-06-01 DIAGNOSIS — J439 Emphysema, unspecified: Secondary | ICD-10-CM | POA: Diagnosis not present

## 2015-06-01 NOTE — Progress Notes (Signed)
Daily Session Note  Patient Details  Name: [name] MRN: [id number] Date of Birth: 03-13-1945 Referring Provider:    Encounter Date: 06/01/2015  Check In:     Session Check In - 06/01/15 1333    Check-In   Location [facility]   Staff Present Su Hilt, MS, ACSM RCEP, Exercise Physiologist;Skarlett Sedlacek Leonia Reeves, RN, Luisa Hart, RN, BSN   Supervising physician immediately available to respond to emergencies See telemetry face sheet for immediately available ER MD   Physician(s) Dr. Marily Memos   Medication changes reported     No   Fall or balance concerns reported    No   Warm-up and Cool-down Performed as group-led instruction   Resistance Training Performed Yes   VAD Patient? No   Pain Assessment   Currently in Pain? No/denies   Multiple Pain Sites No      Capillary Blood Glucose: No results found for this or any previous visit (from the past 24 hour(s)).      Exercise Prescription Changes - 06/01/15 1600    Response to Exercise   Blood Pressure (Admit) 100/60 mmHg   Blood Pressure (Exercise) 100/70 mmHg   Blood Pressure (Exit) 112/60 mmHg   Heart Rate (Admit) 94 bpm   Heart Rate (Exercise) 103 bpm   Heart Rate (Exit) 108 bpm   Oxygen Saturation (Admit) 89 %   Oxygen Saturation (Exercise) 86 %   Oxygen Saturation (Exit) 95 %   Rating of Perceived Exertion (Exercise) 11   Perceived Dyspnea (Exercise) 2   Symptoms extreme dyspnea   Duration Progress to 45 minutes of aerobic exercise without signs/symptoms of physical distress   Intensity THRR unchanged   Progression   Progression Continue to progress workloads to maintain intensity without signs/symptoms of physical distress.   Resistance Training   Training Prescription Yes   Weight ornage bands   Reps 10-12   Interval Training   Interval Training No   Oxygen   Oxygen Continuous   Liters 6   Arm Ergometer   Level 2   Minutes 15   Track   Laps 8   Minutes 15     Goals Met:   Queuing for purse lip breathing Strength training completed today  Goals Unmet:  Not Applicable  Comments: Service time is from 1330 to 1530    Dr. Rush Farmer is Medical Director for Pulmonary Rehab at [facility].

## 2015-06-02 ENCOUNTER — Encounter: Payer: Self-pay | Admitting: Internal Medicine

## 2015-06-02 ENCOUNTER — Ambulatory Visit (INDEPENDENT_AMBULATORY_CARE_PROVIDER_SITE_OTHER): Payer: Medicare Other | Admitting: Internal Medicine

## 2015-06-02 ENCOUNTER — Telehealth: Payer: Self-pay | Admitting: Internal Medicine

## 2015-06-02 VITALS — BP 134/72 | HR 99 | Temp 98.0°F | Ht 64.0 in | Wt 226.0 lb

## 2015-06-02 DIAGNOSIS — R590 Localized enlarged lymph nodes: Secondary | ICD-10-CM

## 2015-06-02 DIAGNOSIS — B370 Candidal stomatitis: Secondary | ICD-10-CM | POA: Insufficient documentation

## 2015-06-02 DIAGNOSIS — J9611 Chronic respiratory failure with hypoxia: Secondary | ICD-10-CM

## 2015-06-02 DIAGNOSIS — J441 Chronic obstructive pulmonary disease with (acute) exacerbation: Secondary | ICD-10-CM

## 2015-06-02 DIAGNOSIS — R599 Enlarged lymph nodes, unspecified: Secondary | ICD-10-CM

## 2015-06-02 DIAGNOSIS — J449 Chronic obstructive pulmonary disease, unspecified: Secondary | ICD-10-CM

## 2015-06-02 MED ORDER — CLOTRIMAZOLE 10 MG MT TROC: 10.0000 mg | Freq: Every day | OROMUCOSAL | Status: DC

## 2015-06-02 MED ORDER — PREDNISONE 10 MG PO TABS: ORAL_TABLET | ORAL | Status: DC

## 2015-06-02 NOTE — Assessment & Plan Note (Signed)
Improved with return to baseline  Cont on O2 at 3l/m rest and 4l/m act

## 2015-06-02 NOTE — Assessment & Plan Note (Signed)
mycelex x 1 wk  Inhaler oral care discussed.

## 2015-06-02 NOTE — Assessment & Plan Note (Signed)
Refer to [facility] for reevaluation , as COPD flare resolving

## 2015-06-02 NOTE — Assessment & Plan Note (Signed)
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   Plan  Decrease prednisone 30mg  daily for 2 weeks then 20mg  daily -hold at this dose.  Continue on Symbicort and Spiriva  Work on not smoking  Continue on Oxygen 3l/m rest and 4l/m act  Mycelex troches x 1 week for thrush  Rinse well after inhalers.  Follow up with Dr. [name] in 4-6 weeks  and As needed   Please contact office for sooner follow up if symptoms do not improve or worsen or seek emergency care

## 2015-06-02 NOTE — Progress Notes (Signed)
Subjective:    Patient ID: [name], female    DOB: 1945-05-23, 70 y.o.   MRN: [id number]  HPI  70 yo female with COPD ,O2 dependent,   TEST  FEV-1 in 2008 was 63% with a diffusion capacity of 33% PFTs 78/14 shows gold stage 3 copd with fev1 1.12L/49%, FVC 2.01L/70% - post bd parameters. No bd response, Ratio 60/77%. TLC 87%, DLCO 5/21% Duplex LE - 07/21/12 - negative. BNP 07/16/12 - 25  2 D echo on 03/08/15 showed EF 60-65%, Grade 1 DD , PAP mod increased at 34mmHg.  OV April 2012: Obese female hospitalized in March 2012 and now following. 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 OVerall doing much better since discharge. Using o2. No insurance. Unable to afford xopenex and pulmicort neb. Able to afford atroven neb. Wanting med change. Dysponea is at baseline class 3. Reportedly cut down on smoking to 1 cig/day. Mild pedal edema improving. Admits to snoring and excess day time somnolence   FEV-1 in 2008 was 63% with a diffusion capacity of 33%   #COPD  - due to cost stop taking pulmicort neb and xopenex neb  - your new regimen is atrovent neb 4 times daily., QVAR inhaler 2 puff bid, albuterol inhaler 2 puff as needed (take samples and show technique)  - use oxygen all 24 hours (we will Hold off on portable system due to cost issues)  # sleep apnea  - you probably have this  - have sleep study and fu with Dr. Elsworth Soho or Dr Halford Chessman  #Smoking  - please quit  #CT lung nodule and node  - we will do CT chest in 6 months  #Followup  - 2 months or sooner if needed  - if new meds are costly please call us soon  - please see $ counsillor soon   OV 11/12/10: Followup a) COPD; b) smoking; c) lung nodule; and d) possible sleep apnea. COPD is stable. Using mdi and  nebs. Still smoking.  Wants to do e-cigs and is asking infor about it. Had sleep study in May 2012: AHI 1.8, PLM 27 but denies restless legs. $ issues and did not fu with Dr Halford Chessman. Says she is finding it tough to pay bills for sleep apnea. Has not had CT chest as fu for nodule; cost issue again. Does not qualify for medicaid. Medicare few years away. Does not have private health care. Goes to [employer]  Past, Family and Social: no change    #sleep study  - you just need to use oxygen with sleep  - you do not need cpap machine  #Smoking  - please quit smoking  - at this point research does not recommend e-cigarette  #COPD  - you use your nebulizers as scheduled  #lung nodule  - you need followup CT chest in next 3-6 months (with contrast)  - meet with my manager to find out cost esp for [facility] patients  #Followup  - 6 months  - flu shot today   OV 06/22/2012  Followup a) COPD; b) smoking; c) lung nodule;  Have not seen her since October 2012. She only now got her Medicare in April 2015 and therefore made this appointment. Overall she says she was stable but now for the past 3 weeks is having  increased cough, wheezing, chest tightness, increased white color phlegm. She is not using her medications correctly. She is finding nebulizers to much of a hassle to use. She does use her Qvar but I'm not sure of her compliance. COPD cat score is 35.  In terms of smoking: She continues to smoke conventional tobacco. The office smells of tobacco. She's not sure she will be interested in quitting  In terms of lung nodule: It has been over 2 years since she had her last CT scan of the chest. She never wanted to have a CT scan of the chest because she had to pay for it out of pocket. But now that she has Medicare she is willing to have the CT scan of the chest  rec #COPD flareup - You are having a COPD flare up right now -  take levaquin 500mg  once daily  X 5 days - Please Please take  prednisone 40 mg x1 day, then 30 mg x1 day, then 20 mg x1 day, then 10 mg x1 day, and then 5 mg x1 day and stop  #COPD - Continue Qvar 80 mcg 2 puff 2 times a day - Try ANORO 1 puff once daily  - this medication is a trial only   - learn technique from my CMA  #Lung nodule  = have followup CT chest - 2 year followup  #Smoking  - please quit  #Followup  - full PFT in 5 weeks - return after ct chest in 5 weeks   06/30/2012 f/u ov/Wert still smoking Chief Complaint  Patient presents with  . Acute Visit    Pt states breathing is no better since visit here on 06/22/12. Still having alot of SOB and prod cough and mucus gets hung in her throat.   not using 02 daytime x after gets tired and sits down, has only tried the neb x one and it did help. Mucus is clear >>rx Dulera, stopped QVAR and Anoro    CT chest >previously seen right lung nodules are no longer  demonstrated. The third nodule is stable. Progressive mediastinal adenopathy.  Changes of COPD and chronic bronchitis  07/16/2012 Acute OV  Complains of increased SOB, and worsening LE edema x2 days Still smoking.  Wearing O2 2 l/m with act and At bedtime   Takes O2 off to smoke. Advised on smoking cessation and dangers of smoking Started on Dulera last ov , Anoro and QVAR were stopped. .  Was not able to start PPI and pepcid , wants an rx.  Wants something for anxiety. Ran out xanax , can not afford to go back to psychiatry.  No chest pain, syncope, fever, cough or n/v.    REC Continue on Dulera 2 puffs Twice daily   Begin Prilosec 20mg  daily before meal  Begin Pepcid 20mg  At bedtime   Xanax 0.5mg  Twice daily  As needed  Anxiety - will need to get refills thru family MD  NO SMOKING- at all especially in home where oxygen tanks/machine is present.  Keep legs elevated.  Take Lasix 20mg  2 tabs daily for 3 days then 1 tab daily  We are setting you up for an ultrasound of your right leg .  Please contact office for sooner  follow up if symptoms do not improve or worsen or seek emergency care  follow up Dr. [name] in 2 weeks and As neede  OV 07/28/2012  FU  1) Lung nodule : had 2 year fu CT - see below.  I personally reviewed this scan  Ct chest 06/25/12 IMPRESSION:  1. The previously seen right lung nodules are no longer  demonstrated. The third nodule is stable. This could be further  evaluated with a repeat chest CT without contrast in 9-12 months.  2. Progressive mediastinal adenopathy. - largest to me looks62mm 3. Changes of COPD and chronic bronchitis.  4. Linear scarring at the left lung base.  5. Diffuse hepatic steatosis.  Original Report Authenticated By: Claudie Revering, M.D   2) COPD - stable diseaes. COPD Cat score is 23 and reflects significant symptoms. Says rehab still has not called her. She is interested. PFTs 78/14 shows gold stage 3 copd with  fev1 1.12L/49%, FVC 2.01L/70% - post bd parameters.  No bd response, Ratio 60/77%. TLC 87%, DLCO 5/21%   3) new RLE pedal edema:   Duplex LE - 07/21/12 - negative. BNP 07/16/12 - 25 and bmet normal. Apparently Dr Melvyn Novas has given her lasix which is helping. She is frustrated she does not know etiology. She has no PCP   Oct 2014 hosp in [place] with chf ? Related to hypoxemia > 3lpm but weaned it to 2lpm   02/16/2013 f/u ov/Wert re: sob/ referred by rehab where she arrived per her usual routine Chief Complaint  Patient presents with  . Acute Visit    Pt c/o increased SOB with any exertion, chest congestion, nonprod cough.  much better on prednisone one week prior to OV  And last dose was 02/12/13 and worse again since. Confused with when/ how to use her various inhalers/ nebs  No obvious day to day or daytime variabilty or assoc   cp or chest tightness, subjective wheeze overt sinus or hb symptoms. No unusual exp hx or h/o childhood pna/ asthma or knowledge of premature birth.  Sleeping ok without nocturnal  or early am exacerbation  of respiratory   c/o's or need for noct saba. Also denies any obvious fluctuation of symptoms with weather or environmental changes or other aggravating or alleviating factors except as outlined above    OV  05/24/2013  Chief Complaint  Patient presents with  . Hosptial Follow up    D/C on 03/14/13- Increased sob with exertion.  Reports not sleeping well over past two and needs refill today on her medications   Recent hospitalization in for  for COPD exacerbation followed by  inpatient rehabilitation. She feels he did not get adequate rehabilitation at the rehabilitation facility. She is getting home physical therapy. Nevertheless she says has lot of fatigue since discharge but  denies chest pain or orthopnea or edema. She continues to use oxygen. She is frustrated by the level of fatigue. She does acknowledge that pulmonary rehabilitation helped her in the past. She's really keen on going the etiology of her fatigue and getting to the bottom of it. She wants portable o2 system  Last echocardiogram March 2008 was normal with an ejection fraction of 65%  TSH August 2014 was normal  Last CT scan of the chest December 2014: No vomiting was in but had small pulmonary nodules which require followup in 2015 December  BNP February 2015: Is normal   ABG February  2015 did show some mild hypercapnia with a PCO2 of 49  Blood work February 2015 shows hemoglobin 11 g percent and a normal creatinine of 1.09 mg percent with a GFR of 60   VIt D - she denies having had it but lab order gives hard stop saying she has had  this tested several times  Dg Chest 2 View  05/24/2013   CLINICAL DATA:  Follow-up pneumonia  EXAM: CHEST  2 VIEW  COMPARISON:  03/14/2013, 01/11/2013, and 08/26/2012  FINDINGS: Chronic interstitial markings/emphysematous changes. Superimposed scarring/fibrosis in the lateral right upper lobe and lingula. No focal consolidation. No pleural effusion or pneumothorax.  Mild cardiomegaly.  Degenerative changes  of the visualized thoracolumbar spine.  IMPRESSION: No evidence of acute cardiopulmonary disease.  Emphysematous changes with superimposed scarring/fibrosis, as above.   Electronically Signed   By: Julian Hy M.D.   On: 05/24/2013 14:39      OV 10/18/2014  Chief Complaint  Patient presents with  . Follow-up    Pt states her breathing is unchanged since last OV.  Pt c/o prod cough in morning with cloudy mucus, pt states the mucus becomes clear throughout the day.    Follow-up  COPD with chronic respiratory failure: It has been over a year since I saw Adjoa Delo . At that time she had significant fatigue which is struggled to address. I referred her to pulmonary rehabilitation but apparently she was turned away. She tells me that she was diagnosed with anemia. Review of the results in the computer show hemoglobin was 10 g percent 07/20/2013. After that apparently according to her history she went on iron pills and by July 2016 in October 2015 hemoglobin rose up to 15 g percent and this fatigue resolved. She says because she was feeling well she is not coming here to review her COPD status. Currently here for refills. Feels well as she can be and is in stable health.  Smoking: She continues to smoke. Finds it difficult to quit  Lung nodules: She was supposed to have a CT scan in December 2015 but obviously she did not follow-up for that. She needs a repeat CT scan      03/10/2015 Follow up : COPD, O2 dependent, Lung nodule  Pt returns for follow up . Had suspected flare 2 weeks ago with increased cough and dyspnea.  She is going to pulmonary rehab. O2 sats have been dropping during exercise.  She was given a steroid taper and abx .  Does feel some better but sill has DOE. Cough is better.  Set up for a 2 D echo on 03/08/15 showed EF 60-65%, Grade 1 DD , PAP mod increased at 64mmHg.  Says she was checked for sleep apnea but was told she did not have sleep apnea. This was >32yrs ago and  weight was about 10lbs lighter.  Still smoking , advised on smoking cessation. Has cut back . Was  CT chest was not done as recommended to follow lung nodule, discussed setting this up soon.   Prevnar and PVX and flu vaccines are utd. .   On Symbicort and Spiriva .   On oxygen 3 l/m rest and 4l/m act .   Denies chest pain, orthopnea , increased edema , calf pain, hemoptysis .     OV 04/04/2015  Chief Complaint  Patient presents with  . Follow-up    Pt here after PET scan to review results. Pt states her breathing is unchanged since OV with TP - pt states her SOB is not at baseline. Pt c/o prod cough with tan mucus.     70 year old female with above medical problems presents for follow-up after PET scan. After my last visit in September 2016 out at a CT scan of the chest as part of routine evaluation. She finally got  it done in end of February 2017. This showed prominent increase in mediastinal adenopathy. She finally had a PET scan which I personally visualized and this shows prominent bilateral hilar adenopathy and mediastinal adenopathy along with axillary adenopathy with high uptake SUV. In addition there is fatty liver, peri-umbilicus hernia and also some renal stones. She is asymptomatic from these. She is now here with his sister Peralrine to discuss these results.  rec -refer CVTS  04/25/2015 Follow up : COPD, O2 dependent, Lung nodule  Pt returns for 2 week follow up . By Was recently seen, CT chest 03/21/15 showed increased mediastinal/bilateral hilar lymphadenopahty worrisome for lymphoma. Unchanged pulmonary nodules x 2 years.  Subsequent PET scan showed intensely hypermetabolic  adenopahty in chest .  She was referred to [facility] , but was too sick w/ COPD exacerbation to proceed  She was started on Keflx and prednisone taper. She is feeling better but feels the prednisone really helped a lot .  Still smoking but has cut back .  advised on smoking cessation. Going to pulmonary  rehab. O2 levels dropping less since prednisone taper.  Gets very winded with min act.   Prevnar and PVX and flu vaccines are utd. .   On Symbicort and Spiriva .   On oxygen 3 l/m rest and 4l/m act .   Denies chest pain, orthopnea , increased edema , calf pain, hemoptysis .    OV 05/12/2015  Chief Complaint  Patient presents with  . Follow-up    Pt c/o increase in SOB. Pt states when she was on 20mg  pred she was feeling better. Pt states her cough is improving. Pt denies CP/tightness. Pt states her spO2 drops to the 70's when on 4lpm at pulm rehab.    Follow-up COPD with chronic hypoxemic respiratory failure associated with cor pulmonale and new mediastinal adenopathy that is PET positive spring 2017 -    -I personally saw her approximately 2 months ago. At that time I referred her to thoracic surgery for evaluation of her new onset PET-positive mediastinal adenopathy. However she was in COPD exacerbation and with her chronic hypoxemic respiratory failure Dr. Pia Mau deferred surgical evaluation. She subsequent he followed up with my nurse practitioner. She was complaining of worsening hypoxemia. Patient was started on a prednisone burst at 20 mg per day. This is now being tapered to 10 mg per day. She is currently on 10 mg per day.  - She tells me that she's been on oxygen 2 L approximately since 2012. Then in 2015 after a visit to [place] she has been on oxygen 3 L. She's been stable on this regimen intermittently at rehabilitation they would increase it to 4 L. However in the last 3 months or so she is having worsening hypoxemia and desaturations with exercise. And even higher levels of oxygen are needed to keep her away from desaturating. She believes that prednisone helps her. The prednisone gives her more energy. However she feels a lowest threshold dose of prednisone 20 mg.  There is no objective evidence that prednisone helps her but subjectively does because it allows her to do more  activities of daily living at a significantly higher level. At this point in time she and her sister Peraline are indicating a desire to have higher level of prednisone.  Following lab tests were reviewed again  Last pulmonary embolus was ruled out was in December 2014 with a CT anginal chest   Echocardiogram February 2017 with cor pulmonale elevated pulmonary artery  systolic pressure of 51 mm but normal left ventricle ejection fraction  Blood work February 2017 with a normal creatinine of 0.9 mg percent. Normal hemoglobin and December 2016 of 15 g percent.   PET scan March 2017: With hypermetabolic lymphadenopathy in mediastinum worrisome for lymphoma - .> surgery postpone by Dr Toma Deiters due to ongoing aecopd and risk  Walk test in office 05/12/2015 > on 3 L baseline oxygen walking 185 feet 3 laps: She only walk one lap and then she desaturated to 86%. At this point her oxygen was increased to 6 L and then she walk one lap and maintained a pulse ox of 90%. After that she was dyspneic and did not want walk further.      06/02/2015 Follow up : COPD, O2 dependent, Lung nodule  Pt returns for 2 week follow up with prolonged COPD exacerbation  . , CT chest 03/21/15 showed increased mediastinal/bilateral hilar lymphadenopahty worrisome for lymphoma. Unchanged pulmonary nodules x 2 years.  Subsequent PET scan showed intensely hypermetabolic  adenopahty in chest .  She was referred to [facility] , but was too sick w/ COPD exacerbation to proceed  She has been started on higher prednisone taper at prednisone 40mg  daily .  She returns today feeling better. O2 sats are improved some.  Today walk test with no desats on 4l/m O2 walking  Spirometry today  is similar to 2015 with severe airflow obstruction with FEV1 49%, ratio 45, FVC 83%.   Going to pulmonary rehab. Loves it. Marland Kitchen  DOE is better, exercise capacity is improving .  Currently on prednisone 40mg  daily  Prevnar and PVX and flu vaccines are utd.  .  Remains on On Symbicort and Spiriva .  On oxygen 3 l/m rest and 4l/m act . 6l/m at pulm rehab with heavy exercise.  Denies chest pain, orthopnea , increased edema , calf pain, hemoptysis .  D Dimer was neg last ov.   Past Medical History  Diagnosis Date  . Hypertension   . COPD (chronic obstructive pulmonary disease) ([place])      FEV-1 in 2008 was 63% with a diffusion capacity of 33%.   . Tobacco abuse      Smokes one pack a day since age 13.  Marland Kitchen History of cervical cancer 1982  . Pulmonary nodule march 2012    71mm RUL and RLL 1st seen march 2012 CT, ?progression 12/2012 CT  . 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 Bronchiectasis march 2012    On CT chest. RUL. Mild  . Medical non-compliance   . Chicken pox   . Seasonal allergies   . Arrhythmia   . Hyperlipidemia   . Tobacco abuse   . Chronic diastolic heart failure ([place])   . Chronic respiratory failure ([place])     Followed in Pulmonary clinic/ [facility]/ Ramaswamy  - 06/30/2012 desat to 86%  RA walking 50 ft, recovered to 90% at rest - 06/30/2012  Walked 1lpm x 3 laps @ 185 ft each stopped due to  Sob, no desat  rec 02 2lpm with activity and sleeping, ok at rest   . Generalized anxiety disorder   . Leg swelling     Venous doppler right 07/21/12 >>Neg    . MITRAL VALVE PROLAPSE   . Obesity, unspecified   . CHF (congestive heart failure) ([place])    Current Outpatient Prescriptions on File Prior to Visit  Medication Sig Dispense Refill  .  albuterol (PROVENTIL) (2.5 MG/3ML) 0.083% nebulizer solution USE 3 ML'S IN NEBULIZER EVERY 6 HOURS AS NEEDED FOR WHEEZING 100 vial 2  . ALPRAZolam (XANAX) 0.5 MG tablet TAKE 1 TABLET TO TWICE A DAY AS NEEDED FOR ANXIETY 60 tablet 0  . budesonide-formoterol (SYMBICORT) 160-4.5 MCG/ACT inhaler Inhale 2 puffs into the lungs 2 (two) times daily. 3 Inhaler 1  . cloNIDine (CATAPRES) 0.1 MG tablet Take 1 tablet (0.1 mg total) by mouth 2 (two) times daily. 180  tablet 3  . famotidine (PEPCID) 20 MG tablet Take 1 tablet (20 mg total) by mouth at bedtime. 90 tablet 3  . fluticasone (FLONASE) 50 MCG/ACT nasal spray Place 2 sprays into both nostrils daily as needed for allergies or rhinitis. 16 g 11  . furosemide (LASIX) 40 MG tablet TAKE 1 TABLET (40 MG TOTAL) BY MOUTH DAILY. 90 tablet 1  . losartan (COZAAR) 25 MG tablet Take 1 tablet (25 mg total) by mouth daily. 90 tablet 3  . metoprolol tartrate (LOPRESSOR) 25 MG tablet Take 0.5 tablets (12.5 mg total) by mouth 2 (two) times daily. 90 tablet 3  . omeprazole (PRILOSEC) 20 MG capsule Take 1 capsule (20 mg total) by mouth daily. 90 capsule 3  . predniSONE (DELTASONE) 10 MG tablet Take 4 tablets (40 mg total) by mouth daily. 120 tablet 5  . simvastatin (ZOCOR) 10 MG tablet Take 2 tablets (20 mg total) by mouth daily at 6 PM. 180 tablet 1  . tiotropium (SPIRIVA HANDIHALER) 18 MCG inhalation capsule PLACE 1 CAPSULE INTO HANDIHALER AND INHALE EVERY DAY 90 capsule 3  . VENTOLIN HFA 108 (90 BASE) MCG/ACT inhaler INHALE 2 PUFFS INTO THE LUNGS EVERY 6 HOURS AS NEEDED 18 Inhaler 2   No current facility-administered medications on file prior to visit.     Review of Systems  Constitutional:   No  weight loss, night sweats,  Fevers, chills,  +fatigue, or  lassitude.  HEENT:   No headaches,  Difficulty swallowing,  Tooth/dental problems, or  Sore throat,                No sneezing, itching, ear ache, nasal congestion, post nasal drip,   CV:  No chest pain,  Orthopnea, PND, swelling in lower extremities, anasarca, dizziness, palpitations, syncope.   GI  No heartburn, indigestion, abdominal pain, nausea, vomiting, diarrhea, change in bowel habits, loss of appetite, bloody stools.   Resp:   No chest wall deformity  Skin: no rash or lesions.  GU: no dysuria, change in color of urine, no urgency or frequency.  No flank pain, no hematuria   MS:  No joint pain or swelling.  No decreased range of motion.  No back  pain.  Psych:  No change in mood or affect. No depression or anxiety.  No memory loss.         Objective:   Physical Exam   Filed Vitals:   06/02/15 1506  BP: 134/72  Pulse: 99  Temp: 98 F (36.7 C)  TempSrc: Oral  Height: 5\' 4"  (1.626 m)  Weight: 226 lb (102.513 kg)  SpO2: 92%    GEN: A/Ox3; pleasant , NAD, obese in wc on O2   HEENT:  [place]/AT,  EACs-clear, TMs-wnl, NOSE-clear, THROAT-clear, no lesions, no postnasal drip or exudate noted. Oral mucosa with few white patches c/w thrush   NECK:  Supple w/ fair ROM; no JVD; normal carotid impulses w/o bruits; no thyromegaly or nodules palpated; no lymphadenopathy.  RESP  Decreased BS in  bases no accessory muscle use, no dullness to percussion  CARD:  RRR, no m/r/g  , tr peripheral edema, pulses intact, no cyanosis or clubbing.  GI:   Soft & nt; nml bowel sounds; no organomegaly or masses detected.  Musco: Warm bil, no deformities or joint swelling noted.   Neuro: alert, no focal deficits noted.    Skin: Warm, no lesions or rashes     Assessment & Plan:   Dannia Snook NP-C  [facility]  06/02/2015    STAFF NOTE: I, Dr Ann Lions have personally reviewed patient's available data, including medical history, events of note, physical examination and test results as part of my evaluation. I have discussed with resident/NP and other care providers such as pharmacist, RN and RRT.  In addition,  I personally evaluated patient and elicited key findings of   S: Subjectively better after 40 mg of prednisone daily. Last seen 05/12/2015. This is a follow-up visit. She feels much better. In fact walking desaturation test on 4 L nasal cannula she did not desaturate which is an improvement from before. However repeat spirometry is similar to baseline at 0.9 L/49%. D-dimer was negative last visit  O: Obese female on nasal cannula looks the same. Cheerful  A: PET hot mediastinal lymphadenopathy Chronic  respiratory failure due to severe obstructive lung disease FEV1 49% Improved symptomatology and hypoxemia with prednisone but no change in FEV1  Suspect temporal symptomatology and hypoxemia might somehow be related to prednisone affecting her medicine lymphadenopathy. The etiology of which is still unknown. Sarcoidosis is possible versus lymphoma. She still needs indication for EBUS or mediastinoscopy  P: Agree with prednisone taper   RE-ferred to Dr. Pia Mau -  For thoracic sampling of the mediastinal nodes I did explain to the patient there was a risk about being ventilator dependent but if  short procedure without Major thoracic incision she might just do fine but we will let thoracic surgery make that call.  Marland Kitchen  Rest per NP/medical resident whose note is outlined above and that I agree with Dr. Brand Males, M.D., [facility] [facility] Medicine Staff Physician [place] [facility] Pager: [phone], If no answer or between  15:00h - 7:00h: call [phone]  06/02/2015 5:47 PM

## 2015-06-02 NOTE — Patient Instructions (Addendum)
Decrease prednisone 30mg  daily for 2 weeks then 20mg  daily -hold at this dose.  Continue on Symbicort and Spiriva  Work on not smoking  Continue on Oxygen 3l/m rest and 4l/m act  Mycelex troches x 1 week for thrush  Rinse well after inhalers.  Follow up with Dr. [name] in 4-6 weeks  and As needed   Please contact office for sooner follow up if symptoms do not improve or worsen or seek emergency care

## 2015-06-02 NOTE — Telephone Encounter (Signed)
Triage  pls re-refer [name] to thoracic surgery DR Servando Snare - mediastinal nodes  THanks  Dr. Brand Males, M.D., [facility].C.P Pulmonary and Critical Care Medicine Staff Physician [place] [facility] Pager: [phone], If no answer or between  15:00h - 7:00h: call [phone]  06/02/2015 5:54 PM

## 2015-06-03 ENCOUNTER — Other Ambulatory Visit: Payer: Self-pay | Admitting: Internal Medicine

## 2015-06-05 NOTE — Telephone Encounter (Signed)
Referral placed again to Cardiothoracic Surgery per last referral  Please refer to Dr. Roxan Hockey or Dr. Pia Mau for eval of mediastinoscopy and endobronchial ultrasound bronchoscopy with biopsy Will send to [facility] to ensure this is scheduled.

## 2015-06-05 NOTE — Telephone Encounter (Signed)
Urgent referral has been sent to [facility] for an appt [name]

## 2015-06-06 ENCOUNTER — Encounter (HOSPITAL_COMMUNITY): Payer: Medicare Other

## 2015-06-08 ENCOUNTER — Encounter (HOSPITAL_COMMUNITY): Payer: Medicare Other

## 2015-06-12 ENCOUNTER — Encounter: Payer: Medicare Other | Admitting: Thoracic Surgery (Cardiothoracic Vascular Surgery)

## 2015-06-13 ENCOUNTER — Ambulatory Visit: Payer: Medicare Other | Admitting: Cardiothoracic Surgery

## 2015-06-14 ENCOUNTER — Encounter: Payer: Self-pay | Admitting: Cardiothoracic Surgery

## 2015-06-14 ENCOUNTER — Ambulatory Visit (INDEPENDENT_AMBULATORY_CARE_PROVIDER_SITE_OTHER): Payer: Medicare Other | Admitting: Cardiothoracic Surgery

## 2015-06-14 VITALS — BP 120/67 | HR 100 | Resp 18 | Ht 64.0 in | Wt 226.0 lb

## 2015-06-14 DIAGNOSIS — J449 Chronic obstructive pulmonary disease, unspecified: Secondary | ICD-10-CM | POA: Diagnosis not present

## 2015-06-14 DIAGNOSIS — R599 Enlarged lymph nodes, unspecified: Secondary | ICD-10-CM | POA: Diagnosis not present

## 2015-06-14 DIAGNOSIS — R590 Localized enlarged lymph nodes: Secondary | ICD-10-CM

## 2015-06-14 NOTE — Progress Notes (Deleted)
[address]       [place] [address]             [phone]                    Genecis Iseminger [place] Medical Record [id number] Date of Birth: 11/02/1945  Referring: Binnie Rail, MD Primary Care: Binnie Rail, MD  Chief Complaint:   No chief complaint on file.   History of Present Illness:    Scarlet Deis 70 y.o. female is seen in the office  today for       Current Activity/ Functional Status:  {functional status:19517}   Zubrod Score: At the time of surgery this patient's most appropriate activity status/level should be described as: []     0    Normal activity, no symptoms []     1    Restricted in physical strenuous activity but ambulatory, able to do out light work []     2    Ambulatory and capable of self care, unable to do work activities, up and about               >50 % of waking hours                              []     3    Only limited self care, in bed greater than 50% of waking hours []     4    Completely disabled, no self care, confined to bed or chair []     5    Moribund   Past Medical History  Diagnosis Date  . Hypertension   . COPD (chronic obstructive pulmonary disease) ([place])      FEV-1 in 2008 was 63% with a diffusion capacity of 33%.   . Tobacco abuse      Smokes one pack a day since age 39.  Marland Kitchen History of cervical cancer 1982  . Pulmonary nodule march 2012    36mm RUL and RLL 1st seen march 2012 CT, ?progression 12/2012 CT  . 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 Bronchiectasis march 2012    On CT chest. RUL. Mild  . Medical non-compliance   . Chicken pox   . Seasonal allergies   . Arrhythmia   . Hyperlipidemia   . Tobacco abuse   . Chronic diastolic heart failure ([place])   . Chronic respiratory failure ([place])     Followed in Pulmonary clinic/ [facility]/ Ramaswamy  - 06/30/2012 desat to 86%  RA walking 50 ft, recovered to 90% at rest - 06/30/2012  Walked 1lpm x 3 laps  @ 185 ft each stopped due to  Sob, no desat  rec 02 2lpm with activity and sleeping, ok at rest   . Generalized anxiety disorder   . Leg swelling     Venous doppler right 07/21/12 >>Neg    . MITRAL VALVE PROLAPSE   . Obesity, unspecified   . CHF (congestive heart failure) [facility])     Past Surgical History  Procedure Laterality Date  . Total abdominal hysterectomy      Family History  Problem Relation Age of Onset  . Other Father     MVA  . Esophageal cancer Mother     Social History   Social History  . Marital Status: Divorced  Spouse Name: N/A  . Number of Children: N/A  . 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 Smoking status: Current Every Day Smoker -- 0.50 packs/day for 44 years    Types: Cigarettes  . Smokeless tobacco: Never Used     Comment: down to 4 cigs per day plus 5 ecigs with nicotien daily//3.14.17  . Alcohol Use: 0.0 oz/week    0 Standard drinks or equivalent per week     Comment: occasional  . Drug Use: No  . Sexual Activity: Not on file   Other Topics Concern  . Not on file   Social History Narrative   Married but recently separated from husband    History  Smoking status  . Current Every Day Smoker -- 0.50 packs/day for 44 years  . Types: Cigarettes  Smokeless tobacco  . Never Used    Comment: down to 4 cigs per day plus 5 ecigs with nicotien daily//3.14.17    History  Alcohol Use  . 0.0 oz/week  . 0 Standard drinks or equivalent per week    Comment: occasional     Allergies  Allergen Reactions  . Ambien [Zolpidem Tartrate] Other (See Comments)    Causes nightmares    Current Outpatient Prescriptions  Medication Sig Dispense Refill  . albuterol (PROVENTIL) (2.5 MG/3ML) 0.083% nebulizer solution USE 3 ML'S IN NEBULIZER EVERY 6 HOURS AS NEEDED FOR WHEEZING 100 vial 2  . ALPRAZolam (XANAX) 0.5 MG tablet TAKE 1 TABLET TO TWICE A DAY AS NEEDED FOR ANXIETY 60 tablet  0  . budesonide-formoterol (SYMBICORT) 160-4.5 MCG/ACT inhaler Inhale 2 puffs into the lungs 2 (two) times daily. 3 Inhaler 1  . cloNIDine (CATAPRES) 0.1 MG tablet Take 1 tablet (0.1 mg total) by mouth 2 (two) times daily. 180 tablet 3  . clotrimazole (MYCELEX) 10 MG troche Take 1 tablet (10 mg total) by mouth 5 (five) times daily. 35 tablet 0  . famotidine (PEPCID) 20 MG tablet Take 1 tablet (20 mg total) by mouth at bedtime. 90 tablet 3  . famotidine (PEPCID) 20 MG tablet TAKE 1 TABLET BY MOUTH AT BEDTIME 90 tablet 3  . fluticasone (FLONASE) 50 MCG/ACT nasal spray Place 2 sprays into both nostrils daily as needed for allergies or rhinitis. 16 g 11  . furosemide (LASIX) 40 MG tablet TAKE 1 TABLET (40 MG TOTAL) BY MOUTH DAILY. 90 tablet 1  . losartan (COZAAR) 25 MG tablet Take 1 tablet (25 mg total) by mouth daily. 90 tablet 3  . metoprolol tartrate (LOPRESSOR) 25 MG tablet Take 0.5 tablets (12.5 mg total) by mouth 2 (two) times daily. 90 tablet 3  . omeprazole (PRILOSEC) 20 MG capsule Take 1 capsule (20 mg total) by mouth daily. 90 capsule 3  . predniSONE (DELTASONE) 10 MG tablet Take 4 tablets (40 mg total) by mouth daily. 120 tablet 5  . predniSONE (DELTASONE) 10 MG tablet 3 tabs daily for 2 weeks,  then 2 tabs daily -hold at this dose. 75 tablet 1  . simvastatin (ZOCOR) 10 MG tablet Take 2 tablets (20 mg total) by mouth daily at 6 PM. 180 tablet 1  . tiotropium (SPIRIVA HANDIHALER) 18 MCG inhalation capsule PLACE 1 CAPSULE INTO HANDIHALER AND INHALE EVERY DAY 90 capsule 3  . VENTOLIN HFA 108 (90 BASE) MCG/ACT inhaler INHALE 2 PUFFS INTO THE LUNGS EVERY 6 HOURS AS NEEDED 18 Inhaler 2   No current  facility-administered medications for this visit.    {Ros - complete:30496}   Review of Systems:     Cardiac Review of Systems: Y or N  Chest Pain [    ]  Resting SOB [   ] Exertional SOB  [  ]  Orthopnea [  ]   Pedal Edema [   ]    Palpitations [  ] Syncope  [  ]   Presyncope [   ]  General  Review of Systems: [Y] = yes [  ]=no Constitional: recent weight change [  ];  Wt loss over the last 3 months [   ] anorexia [  ]; fatigue [  ]; nausea [  ]; night sweats [  ]; fever [  ]; or chills [  ];          Dental: poor dentition[  ]; Last Dentist visit:   Eye : blurred vision [  ]; diplopia [   ]; vision changes [  ];  Amaurosis fugax[  ]; Resp: cough [  ];  wheezing[  ];  hemoptysis[  ]; shortness of breath[  ]; paroxysmal nocturnal dyspnea[  ]; dyspnea on exertion[  ]; or orthopnea[  ];  GI:  gallstones[  ], vomiting[  ];  dysphagia[  ]; melena[  ];  hematochezia [  ]; heartburn[  ];   Hx of  Colonoscopy[  ]; GU: kidney stones [  ]; hematuria[  ];   dysuria [  ];  nocturia[  ];  history of     obstruction [  ]; urinary frequency [  ]             Skin: rash, swelling[  ];, hair loss[  ];  peripheral edema[  ];  or itching[  ]; Musculosketetal: myalgias[  ];  joint swelling[  ];  joint erythema[  ];  joint pain[  ];  back pain[  ];  Heme/Lymph: bruising[  ];  bleeding[  ];  anemia[  ];  Neuro: TIA[  ];  headaches[  ];  stroke[  ];  vertigo[  ];  seizures[  ];   paresthesias[  ];  difficulty walking[  ];  Psych:depression[  ]; anxiety[  ];  Endocrine: diabetes[  ];  thyroid dysfunction[  ];  Immunizations: Flu up to date [  ]; Pneumococcal up to date [  ];  Other:  Physical Exam: There were no vitals taken for this visit.  PHYSICAL EXAMINATION: {physical exam:21449}  Diagnostic Studies & Laboratory data:     Recent Radiology Findings:   No results found.   I have independently reviewed the above radiologic studies.  Recent Lab Findings: Lab Results  Component Value Date   WBC 6.8 01/20/2015   HGB 15.0 01/20/2015   HCT 46.0 01/20/2015   PLT 327.0 01/20/2015   GLUCOSE 133* 05/12/2015   CHOL 172 01/20/2015   TRIG 165.0* 01/20/2015   HDL 34.30* 01/20/2015   LDLDIRECT 113.0 07/26/2014   LDLCALC 105* 01/20/2015   ALT 30 01/20/2015   AST 35 01/20/2015   NA 141 05/12/2015     K 4.4 05/12/2015   CL 103 05/12/2015   CREATININE 0.93 05/12/2015   BUN 9 05/12/2015   CO2 32 05/12/2015   TSH 3.88 01/20/2015   HGBA1C 6.9* 01/20/2015      Assessment / Plan:         I  spent {CHL ONC TIME VISIT - [id number] counseling the patient face to face and 50%  or more the  time was spent in counseling and coordination of care. The total time spent in the appointment was {CHL ONC TIME VISIT - [id number].  Grace Isaac MD      [place] [place] [address] Office [phone]   Beeper [phone]  06/14/2015 10:19 AM

## 2015-06-14 NOTE — Progress Notes (Signed)
[name]       [name] [address]             [phone]                    [name] [place] Medical Record [id number] Date of Birth: 1945/12/11  Referring: Binnie Rail, MD Primary Care: Binnie Rail, MD  Chief Complaint:    Chief Complaint  Patient presents with  . Follow-up    to reassess if she is a candidate for BRONCH/EBUS in view of her severe COPD    History of Present Illness:    [name] 70 y.o. female is seen in the office  today for Re-consideration  of mediastinoscopy. On the patient's patient's previous presentation to the office today she is in the midst of an acute exacerbation of her severe COPD. She is referred to consider mediastinoscopy to biopsy enlarged mediastinal nodes present on a recent CT scan. Since last seen she has been treated by the pulmonary office and has been on prednisone 40 mg a day recently decreased to 30, she remains very limited on her physical ability and functional status, on oxygen continuously at 3 L increased to 6 with any activity. Since last seen she says she feels slightly better.    Current Activity/ Functional Status:  Patient is not independent with mobility/ambulation, transfers, ADL's, IADL's.   Zubrod Score: At the time of surgery this patient's most appropriate activity status/level should be described as: []     0    Normal activity, no symptoms []     1    Restricted in physical strenuous activity but ambulatory, able to do out light work []     2    Ambulatory and capable of self care, unable to do work activities, up and about               >50 % of waking hours                              [x]     3    Only limited self care, in bed greater than 50% of waking hours []     4    Completely disabled, no self care, confined to bed or chair []     5    Moribund   Past Medical History  Diagnosis Date  . Hypertension   . COPD (chronic obstructive pulmonary disease) ([place])      FEV-1  in 2008 was 63% with a diffusion capacity of 33%.   . Tobacco abuse      Smokes one pack a day since age 59.  Marland Kitchen History of cervical cancer 1982  . Pulmonary nodule march 2012    68mm RUL and RLL 1st seen march 2012 CT, ?progression 12/2012 CT  . 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 Bronchiectasis march 2012    On CT chest. RUL. Mild  . Medical non-compliance   . Chicken pox   . Seasonal allergies   . Arrhythmia   . Hyperlipidemia   . Tobacco abuse   . Chronic diastolic heart failure ([place])   . Chronic respiratory failure ([place])     Followed in Pulmonary clinic/ [facility]/ Ramaswamy  - 06/30/2012 desat to 86%  RA walking 50 ft, recovered to 90% at rest - 06/30/2012  Walked 1lpm x 3 laps @ 185 ft each stopped due to  Sob, no desat  rec 02 2lpm with activity and sleeping, ok at rest   . Generalized anxiety disorder   . Leg swelling     Venous doppler right 07/21/12 >>Neg    . MITRAL VALVE PROLAPSE   . Obesity, unspecified   . CHF (congestive heart failure) [facility])     Past Surgical History  Procedure Laterality Date  . Total abdominal hysterectomy      Family History  Problem Relation Age of Onset  . Other Father     MVA  . Esophageal cancer Mother     Social History   Social History  . Marital Status: Divorced    Spouse Name: N/A  . Number of Children: N/A  . 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 Smoking status: Current Every Day Smoker -- 0.50 packs/day for 44 years    Types: Cigarettes  . Smokeless tobacco: Never Used     Comment: down to 4 cigs per day plus 5 ecigs with nicotien daily//3.14.17  . Alcohol Use: 0.0 oz/week    0 Standard drinks or equivalent per week     Comment: occasional  . Drug Use: No  . Sexual Activity: Not on file   Other Topics Concern  . Not on file   Social History Narrative   Married but recently separated from husband      History  Smoking status  . Current Every Day Smoker -- 0.50 packs/day for 44 years  . Types: Cigarettes  Smokeless tobacco  . Never Used    Comment: down to 4 cigs per day plus 5 ecigs with nicotien daily//3.14.17    History  Alcohol Use  . 0.0 oz/week  . 0 Standard drinks or equivalent per week    Comment: occasional     Allergies  Allergen Reactions  . Ambien [Zolpidem Tartrate] Other (See Comments)    Causes nightmares    Current Outpatient Prescriptions  Medication Sig Dispense Refill  . albuterol (PROVENTIL) (2.5 MG/3ML) 0.083% nebulizer solution USE 3 ML'S IN NEBULIZER EVERY 6 HOURS AS NEEDED FOR WHEEZING 100 vial 2  . ALPRAZolam (XANAX) 0.5 MG tablet TAKE 1 TABLET TO TWICE A DAY AS NEEDED FOR ANXIETY 60 tablet 0  . budesonide-formoterol (SYMBICORT) 160-4.5 MCG/ACT inhaler Inhale 2 puffs into the lungs 2 (two) times daily. 3 Inhaler 1  . cloNIDine (CATAPRES) 0.1 MG tablet Take 1 tablet (0.1 mg total) by mouth 2 (two) times daily. 180 tablet 3  . clotrimazole (MYCELEX) 10 MG troche Take 1 tablet (10 mg total) by mouth 5 (five) times daily. 35 tablet 0  . famotidine (PEPCID) 20 MG tablet Take 1 tablet (20 mg total) by mouth at bedtime. 90 tablet 3  . famotidine (PEPCID) 20 MG tablet TAKE 1 TABLET BY MOUTH AT BEDTIME 90 tablet 3  . fluticasone (FLONASE) 50 MCG/ACT nasal spray Place 2 sprays into both nostrils daily as needed for allergies or rhinitis. 16 g 11  . losartan (COZAAR) 25 MG tablet Take 1 tablet (25 mg total) by mouth daily. 90 tablet 3  . metoprolol tartrate (LOPRESSOR) 25 MG tablet Take 0.5 tablets (12.5 mg total) by mouth 2 (two) times daily. 90 tablet 3  . omeprazole (PRILOSEC) 20 MG capsule Take 1 capsule (20 mg total) by mouth daily. 90 capsule 3  . predniSONE (DELTASONE)  10 MG tablet 3 tabs daily for 2 weeks,  then 2 tabs daily -hold at this dose. 75 tablet 1  . simvastatin (ZOCOR) 10 MG tablet Take 2 tablets (20 mg total) by mouth daily at 6 PM. 180  tablet 1  . tiotropium (SPIRIVA HANDIHALER) 18 MCG inhalation capsule PLACE 1 CAPSULE INTO HANDIHALER AND INHALE EVERY DAY 90 capsule 3  . VENTOLIN HFA 108 (90 BASE) MCG/ACT inhaler INHALE 2 PUFFS INTO THE LUNGS EVERY 6 HOURS AS NEEDED 18 Inhaler 2   No current facility-administered medications for this visit.      Review of Systems:     Cardiac Review of Systems: Y or N  Chest Pain [ n   ]  Resting SOB [ y  ] Exertional SOB  [y]  Orthopnea [ y ]   Pedal Edema Blue.Reese   ]    Palpitations [  n] Syncope  [ n ]   Presyncope [  n ]  General Review of Systems: [Y] = yes [  ]=no Constitional: recent weight change [  ];  Wt loss over the last 3 months [   ] anorexia [  ]; fatigue Blue.Reese  ]; nausea [  ]; night sweats [  ]; fever [  ]; or chills [  ];          Dental: poor dentition[  ]; Last Dentist visit:   Eye : blurred vision [  ]; diplopia [   ]; vision changes [  ];  Amaurosis fugax[  ]; Resp: cough Blue.Reese  ];  wheezing[ y ];  hemoptysis[n  ]; shortness of breath[y  ]; paroxysmal nocturnal dyspnea[y  ]; dyspnea on exertion[ y ]; or orthopnea[  ];  GI:  gallstones[  ], vomiting[  ];  dysphagia[  ]; melena[  ];  hematochezia [  ]; heartburn[  ];   Hx of  Colonoscopy[  ]; GU: kidney stones [  ]; hematuria[  ];   dysuria [  ];  nocturia[  ];  history of     obstruction [  ]; urinary frequency [  ]             Skin: rash, swelling[  ];, hair loss[  ];  peripheral edema[  ];  or itching[  ]; Musculosketetal: myalgias[  ];  joint swelling[  ];  joint erythema[  ];  joint pain[  ];  back pain[  ];  Heme/Lymph: bruising[  ];  bleeding[  ];  anemia[  ];  Neuro: TIA[  ];  headaches[  ];  stroke[  ];  vertigo[  ];  seizures[ n ];   paresthesias[  ];  difficulty walking[  ];  Psych:depression[  ]; Sylvester Harder  ];  Endocrine: diabetes[  ];  thyroid dysfunction[  ];  Immunizations: Flu up to date [  ]; Pneumococcal up to date [  ];  Other:  Physical Exam: BP 120/67 mmHg  Pulse 100  Resp 18  Ht 5\' 4"  (1.626 m)  Wt  226 lb (102.513 kg)  BMI 38.77 kg/m2  SpO2 89%  PHYSICAL EXAMINATION: General appearance: alert, cooperative, fatigued and mild distress Head: Normocephalic, without obvious abnormality, atraumatic Neck: no adenopathy, no carotid bruit, no JVD, supple, symmetrical, trachea midline and thyroid not enlarged, symmetric, no tenderness/mass/nodules Lymph nodes: Cervical, supraclavicular, and axillary nodes normal. Resp: diminished breath sounds bilaterally, there is no active wheezing but the patient moves very little air on exam, she is on 3 L of oxygen in  the office with sats at 90 at rest Back: symmetric, no curvature. ROM normal. No CVA tenderness. Cardio: regular rate and rhythm, S1, S2 normal, no murmur, click, rub or gallop GI: soft, non-tender; bowel sounds normal; no masses,  no organomegaly Extremities: extremities normal, atraumatic, no cyanosis or edema and Homans sign is negative, no sign of DVT Neurologic: Grossly normal  Diagnostic Studies & Laboratory data:     Recent Radiology Findings:   Ct Chest Wo Contrast  03/21/2015  CLINICAL DATA:  COPD.  Follow-up pulmonary nodule. EXAM: CT CHEST WITHOUT CONTRAST TECHNIQUE: Multidetector CT imaging of the chest was performed following the standard protocol without IV contrast. COMPARISON:  01/11/2013 chest CT angiogram. FINDINGS: Mediastinum/Nodes: Normal heart size. Trace pericardial fluid/ thickening, unchanged. Coronary atherosclerosis. Atherosclerotic tortuous nonaneurysmal thoracic aorta. Normal caliber main pulmonary artery. Probable 0.7 cm hypodense left thyroid lobe nodule, for which no further follow-up is recommended. This follows ACR consensus guidelines: Managing Incidental Thyroid Nodules Detected on Imaging: White Paper of the ACR Incidental Thyroid Findings Committee. J Am Coll Radiol 2015; 12:143-150. Normal esophagus. No axillary adenopathy. There are several new enlarged right paratracheal lymph nodes, largest 2.0 cm (series  2/ image 20). There is a new moderately enlarged 1.9 cm subcarinal node (series 2/ image 25). Mildly enlarged 1.3 cm AP window node (series 2/ image 20) is unchanged since 01/11/2013. Hilar nodes are poorly delineated on this noncontrast study, and there is a suggestion of several mildly to moderately enlarged bilateral hilar lymph nodes, which appear new on the right measuring up to 1.5 cm in the right infrahilar region (series 2/ image 32) and which appear increased on the left measuring up to 1.3 cm (series 2/image 29). Lungs/Pleura: No pneumothorax. No pleural effusion. Moderate to severe centrilobular emphysema and diffuse bronchial wall thickening. Anterior right upper lobe 4 mm pulmonary nodule (series 3/ image 24) and basilar right upper lobe 5 mm pulmonary nodule (series 3/ image 27) are stable since 01/11/2013 and probably benign. Two right middle lobe 4 mm solid pulmonary nodules (series 3/ images 35 and 41) are stable since 01/11/2013 and probably benign. Left lower lobe 3 mm pulmonary nodule (series 3/image 34) is stable since 01/11/2013 and probably benign. No acute consolidative airspace disease, new significant pulmonary nodules or lung masses. Stable parenchymal band with architectural distortion in the right upper lobe, in keeping with postinfectious/postinflammatory scarring. Upper abdomen: Diffuse hepatic steatosis. Musculoskeletal: No aggressive appearing focal osseous lesions. Mild degenerative changes in the thoracic spine. IMPRESSION: 1. Mediastinal and bilateral hilar lymphadenopathy is increased since 01/11/2013. Findings are concerning for lymphoma, with metastatic disease less likely but not excluded. Consider further evaluation with PET-CT and/or tissue sampling. 2. Subcentimeter pulmonary nodules in both lungs are unchanged for over 2 years and are probably benign. No new lung findings. 3. Moderate to severe centrilobular emphysema and diffuse bronchial wall thickening, in keeping with  the provided history of COPD. 4. Additional findings include coronary atherosclerosis and diffuse hepatic steatosis. Electronically Signed   By: Ilona Sorrel M.D.   On: 03/21/2015 18:34   Nm Pet Image Initial (pi) Skull Base To Thigh  04/03/2015  CLINICAL DATA:  Initial treatment strategy for mediastinal and hilar lymph nodes. Cervical cancer. EXAM: NUCLEAR MEDICINE PET SKULL BASE TO THIGH TECHNIQUE: 11.0 mCi F-18 FDG was injected intravenously. Full-ring PET imaging was performed from the skull base to thigh after the radiotracer. CT data was obtained and used for attenuation correction and anatomic localization. FASTING BLOOD GLUCOSE:  Value: 125  mg/dl COMPARISON:  CT chest 03/21/2015 and 01/11/2013. FINDINGS: NECK A 6 mm nodule posterior to the right parotid gland (CT image 21) has an SUV max of 4.3. No additional hypermetabolic lymph nodes in the neck. Scattered muscular uptake is likely physiologic. Thyroid is mildly hypermetabolic, nonspecific. CT images show no acute findings. CHEST There is hypermetabolic mediastinal, bi hilar and left axillary adenopathy. Index low right paratracheal lymph node measures 2.4 cm (CT image 62) with an SUV max of 19.8. Hypermetabolic periesophageal lymph node measures 11 mm (CT image 83) with an SUV max of 22.0. Additional hypermetabolic lymph nodes are seen in the extrapleural space along the medial lower right hemi thorax (CT image 92) and posterior to the IVC (CT image 92). Patchy ground-glass in the right upper lobe is mildly hypermetabolic (series 6, CT image 21) and likely postinflammatory/ postinfectious in etiology. No hypermetabolic pulmonary nodules. Heart is mildly enlarged. No pericardial or pleural effusion. ABDOMEN/PELVIS No abnormal hypermetabolism in the liver, adrenal glands, spleen or pancreas. No hypermetabolic lymph nodes. CT images show low-attenuation throughout the liver, with areas of peripheral sparing. Gallbladder and adrenal glands are  unremarkable. Stones are seen in the right kidney. Kidneys, spleen, pancreas, stomach and bowel are otherwise grossly unremarkable. Periumbilical hernia contains fat. No free fluid. SKELETON No abnormal osseous hypermetabolism. IMPRESSION: 1. Intensely hypermetabolic adenopathy in the chest is highly worrisome for lymphoma. Sub cm lymph node posterior to the right parotid gland is hypermetabolic and may be involved as well. 2. Hepatic steatosis. 3. Right renal stones. Electronically Signed   By: Lorin Picket M.D.   On: 04/03/2015 14:47     I have independently reviewed the above radiologic studies.  Recent Lab Findings: Lab Results  Component Value Date   WBC 6.8 01/20/2015   HGB 15.0 01/20/2015   HCT 46.0 01/20/2015   PLT 327.0 01/20/2015   GLUCOSE 133* 05/12/2015   CHOL 172 01/20/2015   TRIG 165.0* 01/20/2015   HDL 34.30* 01/20/2015   LDLDIRECT 113.0 07/26/2014   LDLCALC 105* 01/20/2015   ALT 30 01/20/2015   AST 35 01/20/2015   NA 141 05/12/2015   K 4.4 05/12/2015   CL 103 05/12/2015   CREATININE 0.93 05/12/2015   BUN 9 05/12/2015   CO2 32 05/12/2015   TSH 3.88 01/20/2015   HGBA1C 6.9* 01/20/2015      Assessment / Plan:   Patient with mediastinal adenopathy, severe underlying pulmonary disease- with adenopathy question of lymphoma versus small cell lung cancer versus sarcoidosis- The patient remains in very crippled by her respiratory status, to the point I remain concerned about any type of general anesthesia or operative procedure on her, as it has a high likelihood of resulting in prolonged ventilation. This leaves [place] in a quandary as we do not have a definitive tissue diagnosis, with the steroids the patient does note that she has felt better. I'll plan to see her back in one month in the meantime suggest to pulmonary to repeat a CT scan to evaluate any progression or regression of the size of the mediastinal nodes. If the patient does have lymphoma with her current status  it's very unlikely that she could tolerate much treatment.     I  spent 20 minutes counseling the patient face to face and 50% or more the  time was spent in counseling and coordination of care. The total time spent in the appointment was 30 minutes.  Grace Isaac MD      [address] [place] [address] Office [phone]   Beeper [phone]  06/14/2015 5:33 PM

## 2015-06-23 ENCOUNTER — Other Ambulatory Visit: Payer: Self-pay | Admitting: Internal Medicine

## 2015-06-23 NOTE — Telephone Encounter (Signed)
RX faxed to [facility] 

## 2015-06-25 ENCOUNTER — Telehealth: Payer: Self-pay | Admitting: Internal Medicine

## 2015-06-25 DIAGNOSIS — R911 Solitary pulmonary nodule: Secondary | ICD-10-CM

## 2015-06-25 NOTE — Telephone Encounter (Signed)
reviewedd Dr Servando Snare note - he feels high risk for anesthesia. He recommended fu CT chest to see status of nodes. So,   A_ please cancel upcoming 06/30/15 office visit B) do CT angio with contrast (bmet needed) after 07/06/15 (3 months from PET scan) and see TP or me (TP knowsh her situation)  \

## 2015-06-26 ENCOUNTER — Telehealth: Payer: Self-pay | Admitting: *Deleted

## 2015-06-26 ENCOUNTER — Telehealth: Payer: Self-pay | Admitting: Internal Medicine

## 2015-06-26 NOTE — Telephone Encounter (Signed)
MR had openings for tomorrow  I have scheduled pt for 1:45 tomorrow  Nothing further needed

## 2015-06-26 NOTE — Telephone Encounter (Signed)
Ms. [name] has called concerned with her f/u scheduled with Dr. [name].  Her appointment for 06/30/15 has had to be cancelled due to him being out of the country for an extended period. She was offered an appointment with the N.P.  She is uncomfortable with that due to her serious breathing condition.  Dr. Servando Snare is waiting for her to be more stable before attempting a biopsy. Dr. Servando Snare has suggested she request to see Dr. Ashok Cordia.  She said she would do that.  I told her if needed, pulmonary could call our office for confirmation of the recommendation.

## 2015-06-27 ENCOUNTER — Ambulatory Visit: Payer: Medicare Other | Admitting: Internal Medicine

## 2015-06-27 ENCOUNTER — Encounter (HOSPITAL_COMMUNITY)
Admission: RE | Admit: 2015-06-27 | Discharge: 2015-06-27 | Disposition: A | Discharge status: Home / Self Care | Payer: Medicare Other | Source: Ambulatory Visit | Attending: Internal Medicine | Admitting: Internal Medicine

## 2015-06-27 VITALS — Wt 219.1 lb

## 2015-06-27 DIAGNOSIS — J438 Other emphysema: Secondary | ICD-10-CM

## 2015-06-27 DIAGNOSIS — J439 Emphysema, unspecified: Secondary | ICD-10-CM | POA: Insufficient documentation

## 2015-06-27 NOTE — Telephone Encounter (Signed)
Pt is aware of the below information. Order have been placed. Nothing further was needed.

## 2015-06-27 NOTE — Progress Notes (Signed)
Daily Session Note  Patient Details  Name: Opie Fanton MRN: [id number] Date of Birth: [birth year] Referring Provider:    Encounter Date: 06/27/2015  Check In:     Session Check In - 06/27/15 1341    Check-In   Staff Present [name] Fountain, RN, BSN;Molly diVincenzo, MS, ACSM RCEP, Exercise Physiologist;Alias Villagran Leonia Reeves, Therapist, sports, BSN;Ramon Dredge, RN, [facility]   Supervising physician immediately available to respond to emergencies See telemetry face sheet for immediately available ER MD   Physician(s) Dr. Marily Memos   Medication changes reported     No   Fall or balance concerns reported    No   Warm-up and Cool-down Performed as group-led instruction   Resistance Training Performed Yes   VAD Patient? No   Pain Assessment   Currently in Pain? No/denies   Multiple Pain Sites No      Capillary Blood Glucose: No results found for this or any previous visit (from the past 24 hour(s)).      Exercise Prescription Changes - 06/27/15 1500    Response to Exercise   Blood Pressure (Admit) 94/60 mmHg   Blood Pressure (Exercise) 104/60 mmHg   Blood Pressure (Exit) 98/62 mmHg   Heart Rate (Admit) 93 bpm   Heart Rate (Exercise) 109 bpm   Heart Rate (Exit) 101 bpm   Oxygen Saturation (Admit) 91 %   Oxygen Saturation (Exercise) 87 %   Oxygen Saturation (Exit) 97 %   Rating of Perceived Exertion (Exercise) 13   Perceived Dyspnea (Exercise) 2   Symptoms extreme dyspnea   Duration Progress to 45 minutes of aerobic exercise without signs/symptoms of physical distress   Intensity THRR unchanged   Progression   Progression Continue to progress workloads to maintain intensity without signs/symptoms of physical distress.   Resistance Training   Training Prescription Yes   Weight ornage bands   Reps 10-12   Interval Training   Interval Training No   Oxygen   Oxygen Continuous   Liters 6   NuStep   Level 3   Minutes 15   METs 1.5   Arm Ergometer   Level 3   Minutes 15   Track   Laps 4   Minutes 15     Goals Met:  Strength training completed today  Goals Unmet:  Not Applicable  Comments: Service time is from 1330 to 1500    Dr. Rush Farmer is Medical Director for Pulmonary Rehab at [facility]

## 2015-06-29 ENCOUNTER — Encounter (HOSPITAL_COMMUNITY): Payer: Medicare Other

## 2015-06-30 ENCOUNTER — Ambulatory Visit: Payer: Medicare Other | Admitting: Internal Medicine

## 2015-07-04 ENCOUNTER — Telehealth: Payer: Self-pay | Admitting: Internal Medicine

## 2015-07-04 ENCOUNTER — Encounter (HOSPITAL_COMMUNITY)
Admission: RE | Admit: 2015-07-04 | Discharge: 2015-07-04 | Disposition: A | Discharge status: Home / Self Care | Payer: Medicare Other | Source: Ambulatory Visit | Attending: Internal Medicine | Admitting: Internal Medicine

## 2015-07-04 ENCOUNTER — Encounter (HOSPITAL_COMMUNITY): Payer: Self-pay

## 2015-07-04 ENCOUNTER — Emergency Department (HOSPITAL_COMMUNITY)
Admission: EM | Admit: 2015-07-04 | Discharge: 2015-07-04 | Disposition: A | Discharge status: Home / Self Care | Payer: Medicare Other | Attending: Emergency Medicine | Admitting: Emergency Medicine

## 2015-07-04 ENCOUNTER — Other Ambulatory Visit (INDEPENDENT_AMBULATORY_CARE_PROVIDER_SITE_OTHER): Payer: Medicare Other

## 2015-07-04 VITALS — Wt 220.5 lb

## 2015-07-04 DIAGNOSIS — R911 Solitary pulmonary nodule: Secondary | ICD-10-CM

## 2015-07-04 DIAGNOSIS — E0965 Drug or chemical induced diabetes mellitus with hyperglycemia: Secondary | ICD-10-CM | POA: Diagnosis not present

## 2015-07-04 DIAGNOSIS — E785 Hyperlipidemia, unspecified: Secondary | ICD-10-CM | POA: Insufficient documentation

## 2015-07-04 DIAGNOSIS — I5032 Chronic diastolic (congestive) heart failure: Secondary | ICD-10-CM | POA: Insufficient documentation

## 2015-07-04 DIAGNOSIS — Z7984 Long term (current) use of oral hypoglycemic drugs: Secondary | ICD-10-CM | POA: Insufficient documentation

## 2015-07-04 DIAGNOSIS — T380X5A Adverse effect of glucocorticoids and synthetic analogues, initial encounter: Secondary | ICD-10-CM

## 2015-07-04 DIAGNOSIS — R739 Hyperglycemia, unspecified: Secondary | ICD-10-CM | POA: Diagnosis not present

## 2015-07-04 DIAGNOSIS — F17210 Nicotine dependence, cigarettes, uncomplicated: Secondary | ICD-10-CM | POA: Insufficient documentation

## 2015-07-04 DIAGNOSIS — I110 Hypertensive heart disease with heart failure: Secondary | ICD-10-CM | POA: Insufficient documentation

## 2015-07-04 DIAGNOSIS — E669 Obesity, unspecified: Secondary | ICD-10-CM | POA: Diagnosis not present

## 2015-07-04 DIAGNOSIS — J439 Emphysema, unspecified: Secondary | ICD-10-CM | POA: Diagnosis not present

## 2015-07-04 DIAGNOSIS — Z8541 Personal history of malignant neoplasm of cervix uteri: Secondary | ICD-10-CM | POA: Insufficient documentation

## 2015-07-04 DIAGNOSIS — J438 Other emphysema: Secondary | ICD-10-CM

## 2015-07-04 DIAGNOSIS — J449 Chronic obstructive pulmonary disease, unspecified: Secondary | ICD-10-CM | POA: Insufficient documentation

## 2015-07-04 LAB — BASIC METABOLIC PANEL
ANION GAP: 11 (ref 5–15)
BUN: 13 mg/dL (ref 6–23)
BUN: 9 mg/dL (ref 6–20)
CALCIUM: 9.4 mg/dL (ref 8.9–10.3)
CHLORIDE: 97 meq/L (ref 96–112)
CO2: 24 meq/L (ref 19–32)
CO2: 26 mmol/L (ref 22–32)
CREATININE: 0.98 mg/dL (ref 0.44–1.00)
Calcium: 9.4 mg/dL (ref 8.4–10.5)
Chloride: 97 mmol/L — ABNORMAL LOW (ref 101–111)
Creatinine, Ser: 1.06 mg/dL (ref 0.40–1.20)
GFR, EST NON AFRICAN AMERICAN: 58 mL/min — AB (ref >60)
GFR: 66.26 mL/min (ref >60.00)
GLUCOSE: 519 mg/dL — AB (ref 65–99)
Glucose, Bld: 631 mg/dL (ref 70–99)
POTASSIUM: 3.9 mmol/L (ref 3.5–5.1)
Potassium: 3.7 mEq/L (ref 3.5–5.1)
SODIUM: 136 meq/L (ref 135–145)
Sodium: 134 mmol/L — ABNORMAL LOW (ref 135–145)

## 2015-07-04 LAB — URINALYSIS, ROUTINE W REFLEX MICROSCOPIC
BILIRUBIN URINE: NEGATIVE
Glucose, UA: >1000 mg/dL — AB
Hgb urine dipstick: NEGATIVE
KETONES UR: NEGATIVE mg/dL
Leukocytes, UA: NEGATIVE
NITRITE: NEGATIVE
PH: 6 (ref 5.0–8.0)
PROTEIN: NEGATIVE mg/dL
Specific Gravity, Urine: 1.04 — ABNORMAL HIGH (ref 1.005–1.030)

## 2015-07-04 LAB — CBC
HEMATOCRIT: 46.6 % — AB (ref 36.0–46.0)
Hemoglobin: 14.6 g/dL (ref 12.0–15.0)
MCH: 28.1 pg (ref 26.0–34.0)
MCHC: 31.3 g/dL (ref 30.0–36.0)
MCV: 89.8 fL (ref 78.0–100.0)
PLATELETS: 248 10*3/uL (ref 150–400)
RBC: 5.19 MIL/uL — ABNORMAL HIGH (ref 3.87–5.11)
RDW: 14.6 % (ref 11.5–15.5)
WBC: 6.1 10*3/uL (ref 4.0–10.5)

## 2015-07-04 LAB — CBG MONITORING, ED
Glucose-Capillary: 375 mg/dL — ABNORMAL HIGH (ref 65–99)
Glucose-Capillary: 301 mg/dL — ABNORMAL HIGH (ref 65–99)

## 2015-07-04 LAB — GLUCOSE, CAPILLARY
GLUCOSE-CAPILLARY: 523 mg/dL — AB (ref 65–99)
Glucose-Capillary: 570 mg/dL (ref 65–99)

## 2015-07-04 LAB — URINE MICROSCOPIC-ADD ON: RBC / HPF: NONE SEEN RBC/hpf (ref 0–5)

## 2015-07-04 MED ORDER — METFORMIN HCL 500 MG PO TABS: 500.0000 mg | ORAL_TABLET | Freq: Two times a day (BID) | ORAL | Status: DC

## 2015-07-04 MED ORDER — METFORMIN HCL 500 MG PO TABS
500.0000 mg | ORAL_TABLET | Freq: Once | ORAL | Status: AC
  Administered 2015-07-04: 500 mg via ORAL
  Filled 2015-07-04: qty 1

## 2015-07-04 MED ORDER — SODIUM CHLORIDE 0.9 % IV BOLUS (SEPSIS)
1000.0000 mL | Freq: Once | INTRAVENOUS | Status: AC
  Administered 2015-07-04: 1000 mL via INTRAVENOUS

## 2015-07-04 NOTE — Progress Notes (Signed)
Incomplete Session Note  Patient Details  Name: [name] MRN: [id number] Date of Birth: [birth year] Referring Provider:    Maurine Simmering did not complete her rehab session. During her exercise, [name] complained of extreme thirst, blurred vision, and frequent urination. She stated that her frequent urination was such that she did not take her diuretic. [name] is taking 20mg  of prednisone daily. She stated she was on insulin in the past when d/c to inpatient rehab after hospitalization. She also states she was prescribed metformin in years past when she fell ill while traveling in another state. Given patients complaints, CBG was check. It was noted to be 570. A recheck was done in the opposite hand and was noted to be 523. Notified Magda Paganini at [employer] pulmonary. She requested that patient's primary care MD, Dr. Billey Gosling be notified. Spoke with Lovena Le, CMA at Dr. Quay Burow office who relayed message to MD. Requested patient be transported to ED for further eval and labs. Patient D/C from pulmonary rehab exercise session and transported to ED in stable condition. Report given to ED RN.

## 2015-07-04 NOTE — Discharge Instructions (Signed)
Hyperglycemia °Hyperglycemia occurs when the glucose (sugar) in your blood is too high. Hyperglycemia can happen for many reasons, but it most often happens to people who do not know they have diabetes or are not managing their diabetes properly.  °CAUSES  °Whether you have diabetes or not, there are other causes of hyperglycemia. Hyperglycemia can occur when you have diabetes, but it can also occur in other situations that you might not be as aware of, such as: °Diabetes °· If you have diabetes and are having problems controlling your blood glucose, hyperglycemia could occur because of some of the following reasons: °¨ Not following your meal plan. °¨ Not taking your diabetes medications or not taking it properly. °¨ Exercising less or doing less activity than you normally do. °¨ Being sick. °Pre-diabetes °· This cannot be ignored. Before people develop Type 2 diabetes, they almost always have "pre-diabetes." This is when your blood glucose levels are higher than normal, but not yet high enough to be diagnosed as diabetes. Research has shown that some long-term damage to the body, especially the heart and circulatory system, may already be occurring during pre-diabetes. If you take action to manage your blood glucose when you have pre-diabetes, you may delay or prevent Type 2 diabetes from developing. °Stress °· If you have diabetes, you may be "diet" controlled or on oral medications or insulin to control your diabetes. However, you may find that your blood glucose is higher than usual in the hospital whether you have diabetes or not. This is often referred to as "stress hyperglycemia." Stress can elevate your blood glucose. This happens because of hormones put out by the body during times of stress. If stress has been the cause of your high blood glucose, it can be followed regularly by your caregiver. That way he/she can make sure your hyperglycemia does not continue to get worse or progress to  diabetes. °Steroids °· Steroids are medications that act on the infection fighting system (immune system) to block inflammation or infection. One side effect can be a rise in blood glucose. Most people can produce enough extra insulin to allow for this rise, but for those who cannot, steroids make blood glucose levels go even higher. It is not unusual for steroid treatments to "uncover" diabetes that is developing. It is not always possible to determine if the hyperglycemia will go away after the steroids are stopped. A special blood test called an A1c is sometimes done to determine if your blood glucose was elevated before the steroids were started. °SYMPTOMS °· Thirsty. °· Frequent urination. °· Dry mouth. °· Blurred vision. °· Tired or fatigue. °· Weakness. °· Sleepy. °· Tingling in feet or leg. °DIAGNOSIS  °Diagnosis is made by monitoring blood glucose in one or all of the following ways: °· A1c test. This is a chemical found in your blood. °· Fingerstick blood glucose monitoring. °· Laboratory results. °TREATMENT  °First, knowing the cause of the hyperglycemia is important before the hyperglycemia can be treated. Treatment may include, but is not be limited to: °· Education. °· Change or adjustment in medications. °· Change or adjustment in meal plan. °· Treatment for an illness, infection, etc. °· More frequent blood glucose monitoring. °· Change in exercise plan. °· Decreasing or stopping steroids. °· Lifestyle changes. °HOME CARE INSTRUCTIONS  °· Test your blood glucose as directed. °· Exercise regularly. Your caregiver will give you instructions about exercise. Pre-diabetes or diabetes which comes on with stress is helped by exercising. °· Eat wholesome,   balanced meals. Eat often and at regular, fixed times. Your caregiver or nutritionist will give you a meal plan to guide your sugar intake. °· Being at an ideal weight is important. If needed, losing as little as 10 to 15 pounds may help improve blood  glucose levels. °SEEK MEDICAL CARE IF:  °· You have questions about medicine, activity, or diet. °· You continue to have symptoms (problems such as increased thirst, urination, or weight gain). °SEEK IMMEDIATE MEDICAL CARE IF:  °· You are vomiting or have diarrhea. °· Your breath smells fruity. °· You are breathing faster or slower. °· You are very sleepy or incoherent. °· You have numbness, tingling, or pain in your feet or hands. °· You have chest pain. °· Your symptoms get worse even though you have been following your caregiver's orders. °· If you have any other questions or concerns. °  °This information is not intended to replace advice given to you by your health care provider. Make sure you discuss any questions you have with your health care provider. °  °Document Released: 07/03/2000 Document Revised: 04/01/2011 Document Reviewed: 09/13/2014 °Elsevier Interactive Patient Education ©2016 [employer] ° °

## 2015-07-04 NOTE — ED Provider Notes (Signed)
CSN: [id number]     Arrival date & time 07/04/15  1510 History   First MD Initiated Contact with Patient 07/04/15 1805     Chief Complaint  Patient presents with  . Hyperglycemia    HPI Pt has a history of COPD.  She is chronically on oxygen.  She has also been on steroids this past month.  She was at pulmonary rehab today.  She mentioned having increased thirst and visual changes.  She had a CBG at rehab and it was 570.  She was sent to the ED for further evaluation.  She has not had any fevers.    No vomiting.  SHe has been told in the past that she is pre diabetic but is not usually on any medications for diabetes. Past Medical History  Diagnosis Date  . Hypertension   . COPD (chronic obstructive pulmonary disease) ([place])      FEV-1 in 2008 was 63% with a diffusion capacity of 33%.   . Tobacco abuse      Smokes one pack a day since age 70.  Marland Kitchen History of cervical cancer 1982  . Pulmonary nodule march 2012    26mm RUL and RLL 1st seen march 2012 CT, ?progression 12/2012 CT  . 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 Bronchiectasis march 2012    On CT chest. RUL. Mild  . Medical non-compliance   . Chicken pox   . Seasonal allergies   . Arrhythmia   . Hyperlipidemia   . Tobacco abuse   . Chronic diastolic heart failure ([place])   . Chronic respiratory failure ([place])     Followed in Pulmonary clinic/ [facility]/ Ramaswamy  - 06/30/2012 desat to 86%  RA walking 50 ft, recovered to 90% at rest - 06/30/2012  Walked 1lpm x 3 laps @ 185 ft each stopped due to  Sob, no desat  rec 02 2lpm with activity and sleeping, ok at rest   . Generalized anxiety disorder   . Leg swelling     Venous doppler right 07/21/12 >>Neg    . MITRAL VALVE PROLAPSE   . Obesity, unspecified   . CHF (congestive heart failure) [facility])    Past Surgical History  Procedure Laterality Date  . Total abdominal hysterectomy     Family History  Problem Relation Age of Onset  . Other  Father     MVA  . Esophageal cancer Mother    Social History  Substance Use Topics  . Smoking status: Current Every Day Smoker -- 0.50 packs/day for 44 years    Types: Cigarettes  . Smokeless tobacco: Never Used     Comment: down to 4 cigs per day plus 5 ecigs with nicotien daily//3.14.17  . Alcohol Use: 0.0 oz/week    0 Standard drinks or equivalent per week     Comment: occasional   OB History    No data available     Review of Systems  All other systems reviewed and are negative.     Allergies  Ambien  Home Medications   Prior to Admission medications   Medication Sig Start Date End Date Taking? Authorizing Provider  albuterol (PROVENTIL) (2.5 MG/3ML) 0.083% nebulizer solution USE 3 ML'S IN NEBULIZER EVERY 6 HOURS AS NEEDED FOR WHEEZING 12/02/14  Yes Brand Males, MD  ALPRAZolam (XANAX) 0.5 MG tablet TAKE 1 TABLET TWICE A DAY AS NEEDED FOR ANXIETY 06/23/15  Yes Binnie Rail, MD  budesonide-formoterol (SYMBICORT) 160-4.5 MCG/ACT inhaler Inhale 2 puffs into the lungs 2 (two) times daily. 03/22/15  Yes Brand Males, MD  cloNIDine (CATAPRES) 0.1 MG tablet Take 1 tablet (0.1 mg total) by mouth 2 (two) times daily. 03/20/15  Yes Binnie Rail, MD  famotidine (PEPCID) 20 MG tablet Take 1 tablet (20 mg total) by mouth at bedtime. 03/20/15  Yes Binnie Rail, MD  fluticasone (FLONASE) 50 MCG/ACT nasal spray Place 2 sprays into both nostrils daily as needed for allergies or rhinitis. 01/25/15  Yes Binnie Rail, MD  losartan (COZAAR) 25 MG tablet Take 1 tablet (25 mg total) by mouth daily. 03/20/15  Yes Binnie Rail, MD  metoprolol tartrate (LOPRESSOR) 25 MG tablet Take 0.5 tablets (12.5 mg total) by mouth 2 (two) times daily. 03/20/15  Yes Binnie Rail, MD  omeprazole (PRILOSEC) 20 MG capsule Take 1 capsule (20 mg total) by mouth daily. 03/20/15  Yes Binnie Rail, MD  predniSONE (DELTASONE) 10 MG tablet 3 tabs daily for 2 weeks,  then 2 tabs daily -hold at this dose. 06/02/15  Yes  Tammy S Parrett, NP  simvastatin (ZOCOR) 10 MG tablet Take 2 tablets (20 mg total) by mouth daily at 6 PM. 03/30/15  Yes Binnie Rail, MD  tiotropium (SPIRIVA HANDIHALER) 18 MCG inhalation capsule PLACE 1 CAPSULE INTO HANDIHALER AND INHALE EVERY DAY 02/16/15  Yes Noralee Space, MD  VENTOLIN HFA 108 (90 BASE) MCG/ACT inhaler INHALE 2 PUFFS INTO THE LUNGS EVERY 6 HOURS AS NEEDED 12/06/14  Yes Brand Males, MD  clotrimazole (MYCELEX) 10 MG troche Take 1 tablet (10 mg total) by mouth 5 (five) times daily. 06/02/15   Tammy S Parrett, NP  famotidine (PEPCID) 20 MG tablet TAKE 1 TABLET BY MOUTH AT BEDTIME 06/05/15   Binnie Rail, MD  metFORMIN (GLUCOPHAGE) 500 MG tablet Take 1 tablet (500 mg total) by mouth 2 (two) times daily with a meal. 07/04/15   Dorie Rank, MD   BP 112/69 mmHg  Pulse 89  Temp(Src) 98.1 F (36.7 C) (Oral)  Resp 20  SpO2 93% Physical Exam  Constitutional: She is oriented to person, place, and time. No distress.  HENT:  Head: Normocephalic and atraumatic.  Right Ear: External ear normal.  Left Ear: External ear normal.  Eyes: Conjunctivae are normal. Right eye exhibits no discharge. Left eye exhibits no discharge. No scleral icterus.  Neck: Neck supple. No tracheal deviation present.  Cardiovascular: Normal rate, regular rhythm and intact distal pulses.   Pulmonary/Chest: Effort normal and breath sounds normal. No stridor. No respiratory distress. She has no wheezes. She has no rales.  Abdominal: Soft. Bowel sounds are normal. She exhibits no distension. There is no tenderness. There is no rebound and no guarding.  Musculoskeletal: She exhibits no edema or tenderness.  Neurological: She is alert and oriented to person, place, and time. She has normal strength. No cranial nerve deficit (no facial droop, extraocular movements intact, no slurred speech) or sensory deficit. She exhibits normal muscle tone. She displays no seizure activity. Coordination normal.  Skin: Skin is warm  and dry. No rash noted. She is not diaphoretic.  Psychiatric: She has a normal mood and affect.  Nursing note and vitals reviewed.   ED Course  Procedures (including critical care time) Labs Review Labs Reviewed  BASIC METABOLIC PANEL - Abnormal; Notable for the following:    Sodium 134 (*)    Chloride 97 (*)    Glucose, Bld 519 (*)  GFR calc non Af Amer 58 (*)    All other components within normal limits  CBC - Abnormal; Notable for the following:    RBC 5.19 (*)    HCT 46.6 (*)    All other components within normal limits  URINALYSIS, ROUTINE W REFLEX MICROSCOPIC (NOT AT [facility]) - Abnormal; Notable for the following:    APPearance CLOUDY (*)    Specific Gravity, Urine 1.040 (*)    Glucose, UA >1000 (*)    All other components within normal limits  URINE MICROSCOPIC-ADD ON - Abnormal; Notable for the following:    Squamous Epithelial / LPF 0-5 (*)    Bacteria, UA FEW (*)    All other components within normal limits  CBG MONITORING, ED - Abnormal; Notable for the following:    Glucose-Capillary 375 (*)    All other components within normal limits      MDM   Final diagnoses:  Steroid-induced hyperglycemia    Labs do not show DKA.  Hyperglycemia improving with iv fluids.  Will dc home with prescription for metformin.  Follow up with PCP to follow up on her blood sugar, discuss blood sugar testing.    Dorie Rank, MD 07/04/15 1950

## 2015-07-04 NOTE — Telephone Encounter (Signed)
Spoke with Truddie Crumble with Pulm Rehab  She states that the pt had mentioned to her that she was having increased thirst, and being that she takes steroids they checked a blood sugar and it was 570 She is letting us know as just an FYI and is going to place a call to pt's PCP, Dr Billey Gosling and have the pt f/u with her  I advised will let MR know  Nothing further needed

## 2015-07-04 NOTE — Telephone Encounter (Signed)
Spoke with [name] in the lab. States that pt's glucose level was 631. According to another phone message from today the pt's PCP is being contacted in regards to her high sugar level. Message will be closed.

## 2015-07-04 NOTE — ED Notes (Signed)
Patient here from pulmonary rehab for her COPD-on 3l [place] all the time. Has complained of increased thirst and visual changes and they checked her blood sugar and found it to be 570. No history of diabetes. Patient in no distress on arrival

## 2015-07-04 NOTE — ED Notes (Signed)
Pt reports being on steroids recently and has had problems in the passed with steroid induced hyperglycemia.

## 2015-07-05 ENCOUNTER — Telehealth: Payer: Self-pay | Admitting: Internal Medicine

## 2015-07-05 NOTE — Telephone Encounter (Signed)
[facility] Patient Name: [name] DOB: 12/24/1945 Initial Comment Caller states she is on Prednisone, and the hospital usually puts her on diabetes medication. She has been having steroid induced hypoglycemia. Has questions about diet. Unsure what symptoms to look out for. Freq unrination Nurse Assessment Nurse: Malva Cogan, RN, Juliann Pulse Date/Time (Eastern Time): 07/05/2015 5:00:46 PM Confirm and document reason for call. If symptomatic, describe symptoms. You must click the next button to save text entered. ---Caller states that she has recently been diagnosed with exacerbation of COPD, has seen in ED yesterday secondary to increased BS of 570. Has H/O steroid induced hyperglycemia when has been hospitalized & been on steroids. Caller states that she is scheduled to have CT scan on Friday & has scrip for Metformin now. Advised that she should temporarily stop taking Metformin if she is having CT scan with dye. Does not have any glucometer to check her BS now. ED advised caller that she needs to see her PCP within 2 days of ED visit, has appt for 07/17/15 & did advise office staff that ED recommended that she be seen within 48 hrs. Is having urinary frequency but is on Lasix, has dry mouth, & blurred vision is improving. Has the patient traveled out of the country within the last 30 days? ---No Does the patient have any new or worsening symptoms? ---Yes Will a triage be completed? ---Yes Related visit to physician within the last 2 weeks? ---Yes Does the PT have any chronic conditions? (i.e. diabetes, asthma, etc.) ---Yes List chronic conditions. ---COPD, HTN, CHF, H/O steroid induced hyperglycemia, high cholesterol, acid reflux Is this a behavioral health or substance abuse call? ---No Guidelines Guideline Title Affirmed Question Affirmed Notes [place] Symptoms [1] Itching or dryness of genital area  AND [2] nearing menopause or after menopause Final Disposition User See PCP within [place], RN, Juliann Pulse Comments No blurred vision now. has not taken anything today for pain doesn't interfere with nml activities PLEASE NOTE: All timestamps contained within this report are represented as [place] Standard Time. CONFIDENTIALTY NOTICE: This fax transmission is intended only for the addressee. It contains information that is legally privileged, confidential or otherwise protected from use or disclosure. If you are not the intended recipient, you are strictly prohibited from reviewing, disclosing, copying using or disseminating any of this information or taking any action in reliance on or regarding this information. If you have received this fax in error, please notify us immediately by telephone so that we can arrange for its return to [place]. Phone: [phone], Toll-Free: [phone], Fax

## 2015-07-06 ENCOUNTER — Telehealth: Payer: Self-pay

## 2015-07-06 ENCOUNTER — Encounter (HOSPITAL_COMMUNITY)
Admission: RE | Admit: 2015-07-06 | Discharge: 2015-07-06 | Disposition: A | Discharge status: Home / Self Care | Payer: Medicare Other | Source: Ambulatory Visit | Attending: Internal Medicine | Admitting: Internal Medicine

## 2015-07-06 ENCOUNTER — Telehealth: Payer: Self-pay | Admitting: Internal Medicine

## 2015-07-06 ENCOUNTER — Encounter: Payer: Self-pay | Admitting: Internal Medicine

## 2015-07-06 DIAGNOSIS — J439 Emphysema, unspecified: Secondary | ICD-10-CM | POA: Diagnosis not present

## 2015-07-06 DIAGNOSIS — J441 Chronic obstructive pulmonary disease with (acute) exacerbation: Secondary | ICD-10-CM

## 2015-07-06 DIAGNOSIS — J438 Other emphysema: Secondary | ICD-10-CM

## 2015-07-06 LAB — GLUCOSE, CAPILLARY: GLUCOSE-CAPILLARY: 381 mg/dL — AB (ref 65–99)

## 2015-07-06 NOTE — Telephone Encounter (Signed)
Called - closed for lunch - will try later

## 2015-07-06 NOTE — Telephone Encounter (Signed)
[name] [id number] Would really like to talk to Dr. Quay Burow about this patients. She would not go into details she said it was way to much to go over now. Cardiology and pulmonary rehab at [place].

## 2015-07-06 NOTE — Telephone Encounter (Signed)
Deneise Lever, MD at 07/06/2015 9:22 AM     Status: Signed       Expand All Collapse All   Suggest CT be rescheduled for 2 weeks from now- set definite date, don't let it drift. Patient needs to see her PCP immediately to get blood sugar under control, so she can be off metformin per protocol for her necessary CT.       Called CT and her scan has been moved to 07/21/15 at 1:30pm. Pt is aware. Nothing further was needed.

## 2015-07-06 NOTE — Progress Notes (Signed)
[name] 70 y.o. female  Nutrition Note Pt seen due to hyperglycemia. Pt started Metformin this am. Pt reports she is taking 500 mg Metformin BID. Non-fasting CBG 360 mg/dL before Pulmonary education session at 1:30 pm. Pt given a glucometer kit and educated how to use the glucometer. Pt checked CBG by herself at 3 pm and CBG was 440 mg/dL. Pt educated re: carbohydrate basics of DM diet and given a handout for 1500 kcal, 5 day menu ideas. Pt expressed understanding of the information reviewed via feedback method. Continue client-centered nutrition education by RD as part of interdisciplinary care.  Monitor and evaluate progress toward nutrition goal with team.  Derek Mound, M.Ed, RD, LDN, CDE 07/06/2015 4:35 PM

## 2015-07-06 NOTE — Telephone Encounter (Signed)
CY please advise. Pt is scheduled for a CT with contrast tomorrow. She was told that she should not have the CT while on Metformin.

## 2015-07-06 NOTE — Telephone Encounter (Signed)
lmtcb x1 for [place]  

## 2015-07-06 NOTE — Progress Notes (Signed)
[name] presented to pulmonary rehab as instructed in order to provide [name] with a glucometer, educate on use, and to check her CBG. Upon arrival her CBG was 360. Patient was asymptomatic. She was not allowed to exercise. After education and consultation with RD, CBG was 440. Took prescribed metformin this am. Called Dr. Billey Gosling office to get patient appointment to be seen ASAP for blood glucose control. Spoke with [place]. Stated she did not want to give patient appointment with another provider tomorrow and would speak to Dr. Quay Burow in the morning to see if she could work [place] in. Will follow-up with [facility] in the am to see if Dr. Quay Burow has consented to see patient. She was discharged in stable condition.

## 2015-07-06 NOTE — Telephone Encounter (Signed)
[name] with pulm rehab called back, states she wanted to let us know that she has ov on 6/22 with Dr. Servando Snare to review CT results- we need to call that office and have them reschedule her appt since her CT is scheduled for 07/21/15.   Called Dr. Everrett Coombe office, they will move appt if necessary for pt.  Nothing further needed on our end at this time.

## 2015-07-06 NOTE — Telephone Encounter (Signed)
Suggest CT be rescheduled for 2 weeks from now- set definite date, don't let it drift. Patient needs to see her PCP immediately to get blood sugar under control, so she can be off metformin per protocol for her necessary CT.

## 2015-07-07 ENCOUNTER — Inpatient Hospital Stay: Admission: RE | Admit: 2015-07-07 | Payer: Medicare Other | Source: Ambulatory Visit

## 2015-07-07 ENCOUNTER — Encounter (HOSPITAL_COMMUNITY): Payer: Self-pay

## 2015-07-07 ENCOUNTER — Ambulatory Visit (INDEPENDENT_AMBULATORY_CARE_PROVIDER_SITE_OTHER): Payer: Medicare Other | Admitting: Internal Medicine

## 2015-07-07 VITALS — BP 102/62 | HR 89 | Temp 97.7°F | Resp 20 | Wt 221.0 lb

## 2015-07-07 DIAGNOSIS — E119 Type 2 diabetes mellitus without complications: Secondary | ICD-10-CM

## 2015-07-07 DIAGNOSIS — R739 Hyperglycemia, unspecified: Secondary | ICD-10-CM | POA: Diagnosis not present

## 2015-07-07 LAB — GLUCOSE, POCT (MANUAL RESULT ENTRY): POC Glucose: 290 mg/dl — AB (ref 70–99)

## 2015-07-07 LAB — POCT GLYCOSYLATED HEMOGLOBIN (HGB A1C): Hemoglobin A1C: 10.9

## 2015-07-07 MED ORDER — PEN NEEDLES 32G X 4 MM MISC: 15.0000 [IU] | Freq: Every day | Status: DC

## 2015-07-07 MED ORDER — INSULIN GLARGINE 100 UNIT/ML SOLOSTAR PEN: 15.0000 [IU] | PEN_INJECTOR | Freq: Every day | SUBCUTANEOUS | Status: DC

## 2015-07-07 NOTE — Progress Notes (Addendum)
Subjective:    Patient ID: [name], female    DOB: 08-23-1945, 70 y.o.   MRN: [id number]  HPI She is here for acute visit for hyperglycemia/diabetes.    She has been on prednisone for just over two month for her COPD.  She was diagnosed with diabetes in December and at that time her sugars were controlled and she did not need medication.  Her sugars were recently identified to be elevated - 500-600's at pulmonary rehab.  She did go to the ED and DKA was ruled out.  She was started on metformin at the ED and has only taken three doses so far.  Her sugars are still elevated.  Her sugar yesterday at rehab was 440, last night at 10pm 288, this morning at 5am 300's.  She is unsure how long she will be on steroids.  They are hoping to get her COPD better controlled so she can have a biopsy of her mediastinal mass.  She will be having a ct scan with contrast next month.    Medications and allergies reviewed with patient and updated if appropriate.  Patient Active Problem List   Diagnosis Date Noted  . Oral candidiasis 06/02/2015  . 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 Diabetes ([place]) 01/23/2015  . Fatigue 01/20/2015  . COLD (chronic obstructive lung disease) ([place]) 10/18/2014  . Generalized anxiety disorder 03/15/2013  . Obesity, unspecified 03/15/2013  . Chronic diastolic heart failure ([place]) 03/14/2013  . Chronic respiratory failure ([place]) 06/30/2012  . COPD (chronic obstructive pulmonary disease) ([place])   . Tobacco abuse   . Pulmonary nodule 03/22/2010  . 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 Bronchiectasis 03/22/2010  . Hyperlipidemia 06/13/2008  . Essential hypertension 03/06/2007  . ADJUSTMENT DISORDER WITH ANXIETY 02/20/2007  . MITRAL VALVE PROLAPSE 02/20/2007  . CERVICAL CANCER, HX OF 02/20/2007    Current Outpatient Prescriptions on File Prior to Visit  Medication Sig Dispense Refill  . albuterol (PROVENTIL) (2.5 MG/3ML) 0.083% nebulizer solution USE 3 ML'S IN NEBULIZER EVERY  6 HOURS AS NEEDED FOR WHEEZING 100 vial 2  . ALPRAZolam (XANAX) 0.5 MG tablet TAKE 1 TABLET TWICE A DAY AS NEEDED FOR ANXIETY 60 tablet 0  . budesonide-formoterol (SYMBICORT) 160-4.5 MCG/ACT inhaler Inhale 2 puffs into the lungs 2 (two) times daily. 3 Inhaler 1  . cloNIDine (CATAPRES) 0.1 MG tablet Take 1 tablet (0.1 mg total) by mouth 2 (two) times daily. 180 tablet 3  . clotrimazole (MYCELEX) 10 MG troche Take 1 tablet (10 mg total) by mouth 5 (five) times daily. 35 tablet 0  . famotidine (PEPCID) 20 MG tablet Take 1 tablet (20 mg total) by mouth at bedtime. 90 tablet 3  . famotidine (PEPCID) 20 MG tablet TAKE 1 TABLET BY MOUTH AT BEDTIME 90 tablet 3  . fluticasone (FLONASE) 50 MCG/ACT nasal spray Place 2 sprays into both nostrils daily as needed for allergies or rhinitis. 16 g 11  . losartan (COZAAR) 25 MG tablet Take 1 tablet (25 mg total) by mouth daily. 90 tablet 3  . metFORMIN (GLUCOPHAGE) 500 MG tablet Take 1 tablet (500 mg total) by mouth 2 (two) times daily with a meal. 60 tablet 1  . metoprolol tartrate (LOPRESSOR) 25 MG tablet Take 0.5 tablets (12.5 mg total) by mouth 2 (two) times daily. 90 tablet 3  . omeprazole (PRILOSEC) 20 MG capsule Take 1 capsule (20 mg total) by mouth daily. 90 capsule 3  . predniSONE (DELTASONE) 10 MG tablet 3 tabs daily for 2 weeks,  then 2 tabs daily -hold at this dose. 75 tablet 1  . simvastatin (ZOCOR) 10 MG tablet Take 2 tablets (20 mg total) by mouth daily at 6 PM. 180 tablet 1  . tiotropium (SPIRIVA HANDIHALER) 18 MCG inhalation capsule PLACE 1 CAPSULE INTO HANDIHALER AND INHALE EVERY DAY 90 capsule 3  . VENTOLIN HFA 108 (90 BASE) MCG/ACT inhaler INHALE 2 PUFFS INTO THE LUNGS EVERY 6 HOURS AS NEEDED 18 Inhaler 2   No current facility-administered medications on file prior to visit.    Past Medical History  Diagnosis Date  . Hypertension   . COPD (chronic obstructive pulmonary disease) ([place])      FEV-1 in 2008 was 63% with a diffusion capacity of  33%.   . Tobacco abuse      Smokes one pack a day since age 36.  Marland Kitchen History of cervical cancer 1982  . Pulmonary nodule march 2012    77mm RUL and RLL 1st seen march 2012 CT, ?progression 12/2012 CT  . 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 Bronchiectasis march 2012    On CT chest. RUL. Mild  . Medical non-compliance   . Chicken pox   . Seasonal allergies   . Arrhythmia   . Hyperlipidemia   . Tobacco abuse   . Chronic diastolic heart failure ([place])   . Chronic respiratory failure ([place])     Followed in Pulmonary clinic/ [facility]/ Ramaswamy  - 06/30/2012 desat to 86%  RA walking 50 ft, recovered to 90% at rest - 06/30/2012  Walked 1lpm x 3 laps @ 185 ft each stopped due to  Sob, no desat  rec 02 2lpm with activity and sleeping, ok at rest   . Generalized anxiety disorder   . Leg swelling     Venous doppler right 07/21/12 >>Neg    . MITRAL VALVE PROLAPSE   . Obesity, unspecified   . CHF (congestive heart failure) [facility])     Past Surgical History  Procedure Laterality Date  . Total abdominal hysterectomy      Social History   Social History  . Marital Status: Divorced    Spouse Name: N/A  . Number of Children: N/A  . 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 Smoking status: Current Every Day Smoker -- 0.50 packs/day for 44 years    Types: Cigarettes  . Smokeless tobacco: Never Used     Comment: down to 4 cigs per day plus 5 ecigs with nicotien daily//3.14.17  . Alcohol Use: 0.0 oz/week    0 Standard drinks or equivalent per week     Comment: occasional  . Drug Use: No  . Sexual Activity: Not on file   Other Topics Concern  . Not on file   Social History Narrative   Married but recently separated from husband    Family History  Problem Relation Age of Onset  . Other Father     MVA  . Esophageal cancer Mother     Review of Systems  Constitutional:  Positive for diaphoresis (at night). Negative for fever.  Eyes: Positive for visual disturbance (little blurry vision).  Respiratory: Positive for shortness of breath. Negative for cough and wheezing.   Cardiovascular: Negative for chest pain, palpitations and leg swelling.  Endocrine: Negative for polydipsia and polyuria.  Neurological: Positive for headaches. Negative for light-headedness.  Objective:   Filed Vitals:   07/07/15 1434  BP: 102/62  Pulse: 89  Temp: 97.7 F (36.5 C)  Resp: 20   Filed Weights   07/07/15 1434  Weight: 221 lb (100.245 kg)   Body mass index is 37.92 kg/(m^2).   Physical Exam Constitutional: Appears well-developed and well-nourished. No distress. on oxygen via [place] Neck: Neck supple. No tracheal deviation present. No thyromegaly present.  No carotid bruit. No cervical adenopathy.   Cardiovascular: Normal rate, regular rhythm and normal heart sounds.   No murmur heard.  mild edema Pulmonary/Chest: Effort normal and breath sounds normal. No respiratory distress. No wheezes.       Assessment & Plan:   See Problem List for Assessment and Plan of chronic medical problems.

## 2015-07-07 NOTE — Progress Notes (Signed)
Pre visit review using our clinic review tool, if applicable. No additional management support is needed unless otherwise documented below in the visit note. 

## 2015-07-07 NOTE — Patient Instructions (Signed)
   Medications reviewed and updated.  Changes include starting insulin  Your prescription(s) have been submitted to your pharmacy. Please take as directed and contact our office if you believe you are having problem(s) with the medication(s).  Please followup next week

## 2015-07-07 NOTE — Telephone Encounter (Signed)
error 

## 2015-07-07 NOTE — Telephone Encounter (Signed)
Seen today. 

## 2015-07-08 ENCOUNTER — Encounter: Payer: Self-pay | Admitting: Internal Medicine

## 2015-07-08 NOTE — Assessment & Plan Note (Addendum)
Lab Results  Component Value Date   HGBA1C 10.9 07/07/2015   This is still a relatively new diagnosis for her and we reviewed that Discussed importance of getting and keeping sugars well controlled a1c was 6.9 6 months and is now 10.9 - secondary to steroids Continue metformin - this will need to be stopped for her ct scan Start lantus 15 units at night - sample given Follow up next week Will need to adjust insulin and depending on steroid dose and duration she may or may not need insulin long term Discussed risk of hypoglycemia

## 2015-07-09 ENCOUNTER — Encounter: Payer: Self-pay | Admitting: Internal Medicine

## 2015-07-10 ENCOUNTER — Telehealth (HOSPITAL_COMMUNITY): Payer: Self-pay | Admitting: *Deleted

## 2015-07-10 ENCOUNTER — Telehealth: Payer: Self-pay | Admitting: Internal Medicine

## 2015-07-10 MED ORDER — ALBUTEROL SULFATE HFA 108 (90 BASE) MCG/ACT IN AERS: INHALATION_SPRAY | RESPIRATORY_TRACT | Status: DC

## 2015-07-10 NOTE — Telephone Encounter (Signed)
Spoke with pt. She states that it has been 2-3 hours since she had any abdominal pain. She has not had diarrhea since last night. Understood to stop Metformin and to increase insulin.

## 2015-07-10 NOTE — Telephone Encounter (Signed)
Stop metformin.  Confirm she is taking the insulin once a day - if she is - have her increase it from 15 units daily to 20 units daily.  Call back in 1-2 days with sugars.

## 2015-07-10 NOTE — Progress Notes (Signed)
[name] 70 y.o. female Nutrition Note Pt called. Lantus 15 units q day started on Friday 07/07/15. Pt reports CBG's improved Saturday and Sunday. Pt states her fasting CBG this am was 332 mg/dL. Pt ate lunch at 12 pm. Pt was unclear re: food intake at lunch today. Post-prandial glucose around 1:15-1:20 PM reportedly "5-something." Pt instructed to call her PCP re: hyperglycemia. Pt c/o diarrhea with Metformin. Pt instructed to try Metamucil to help bulk up stools and to consider trying activated charcoal capsules (2 capsules before each meal) to help with diarrhea. Pt also instructed to call Pulmonary Rehab if CBG tomorrow > 225-250 mg/dL before leaving for exercise. Pt expressed understanding via feedback method. Continue client-centered nutrition education by RD as part of interdisciplinary care.  Monitor and evaluate progress toward nutrition goal with team.  Derek Mound, M.Ed, RD, LDN, CDE 07/10/2015 2:02 PM

## 2015-07-10 NOTE — Telephone Encounter (Signed)
Please advise 

## 2015-07-10 NOTE — Telephone Encounter (Signed)
Patient Name: [name] DOB: 08-30-1945 Initial Comment Caller states her blood sugar increased last week and she was put on Metformin. She has been having abdominal pain that feels like she will have diarrhea. Her blood sugar was 332 this morning. Nurse Assessment Nurse: Ronnald Ramp, RN, Miranda Date/Time (Eastern Time): 07/10/2015 2:46:33 PM Confirm and document reason for call. If symptomatic, describe symptoms. You must click the next button to save text entered. ---Caller states she was recently started on Metformin and has been having diarrhea and abdominal cramping for the last 2 days. Her BS was 332 this morning. 1 hour after she ate it was > 500. Has the patient traveled out of the country within the last 30 days? ---Not Applicable Does the patient have any new or worsening symptoms? ---Yes Will a triage be completed? ---Yes Related visit to physician within the last 2 weeks? ---Yes Does the PT have any chronic conditions? (i.e. diabetes, asthma, etc.) ---Yes List chronic conditions. ---COPD, new onset diabetes, HTN Is this a behavioral health or substance abuse call? ---No Guidelines Guideline Title Affirmed Question Affirmed Notes Diabetes - High Blood Sugar [1] Blood glucose > 300 mg/dl (16.5 mmol/ l) AND [2] two or more times in a row Final Disposition User Call PCP Now Ronnald Ramp, RN, Miranda Referrals GO TO FACILITY REFUSED Disagree/Comply: Comply

## 2015-07-10 NOTE — Telephone Encounter (Signed)
Patient states she was placed on a new insulin.  States blood sugar has been running 335 this am and an hour ago was over 500.  States that she is also having diarrhea.  Is requesting call back in regard.  Will send to [facility].

## 2015-07-11 ENCOUNTER — Encounter (HOSPITAL_COMMUNITY)
Admission: RE | Admit: 2015-07-11 | Discharge: 2015-07-11 | Disposition: A | Discharge status: Home / Self Care | Payer: Medicare Other | Source: Ambulatory Visit | Attending: Internal Medicine | Admitting: Internal Medicine

## 2015-07-11 ENCOUNTER — Other Ambulatory Visit: Payer: Self-pay | Admitting: Internal Medicine

## 2015-07-11 ENCOUNTER — Telehealth (HOSPITAL_COMMUNITY): Payer: Self-pay | Admitting: *Deleted

## 2015-07-12 ENCOUNTER — Telehealth: Payer: Self-pay

## 2015-07-12 ENCOUNTER — Encounter: Payer: Self-pay | Admitting: Internal Medicine

## 2015-07-12 NOTE — Telephone Encounter (Signed)
Patient called and states that her blood sugars are.. June 19 @ 10pm 218 June 20 @6a  200 June 20 @620p  370 June 20 @954p  235 June 21 @7am  212  Please follow up.  Patient is also saying she wants to stop her [place] mail order as well?

## 2015-07-12 NOTE — Telephone Encounter (Signed)
Pt has sent in several emails about rescheduling her CT scan. She is concerned that her blood sugar levels are still not under control. She was advised to wait and reschedule the CT once they were. Please advise as to any recommendations regarding CT scan rescheduling. Pt is being monitored for her BS by Dr. Billey Gosling.  MR - Please advise. Thanks!

## 2015-07-13 ENCOUNTER — Encounter: Payer: Medicare Other | Admitting: Cardiothoracic Surgery

## 2015-07-13 ENCOUNTER — Other Ambulatory Visit: Payer: Self-pay | Admitting: Internal Medicine

## 2015-07-13 ENCOUNTER — Encounter: Payer: Self-pay | Admitting: Internal Medicine

## 2015-07-13 ENCOUNTER — Telehealth: Payer: Self-pay

## 2015-07-13 ENCOUNTER — Encounter (HOSPITAL_COMMUNITY): Payer: Medicare Other

## 2015-07-13 MED ORDER — INSULIN GLARGINE 100 UNIT/ML SOLOSTAR PEN: 22.0000 [IU] | PEN_INJECTOR | Freq: Every day | SUBCUTANEOUS | Status: DC

## 2015-07-13 NOTE — Telephone Encounter (Signed)
Blood sugar is for PET scan only . For CT chest with contrast - I do not think blood sugar matters. What kind of scan are we talking abouty

## 2015-07-13 NOTE — Telephone Encounter (Signed)
Thank you - no, increase to 27 units

## 2015-07-13 NOTE — Telephone Encounter (Signed)
See TE created 6/22

## 2015-07-13 NOTE — Telephone Encounter (Signed)
Test in question is a CT Angio Chest w/w/o contrast.   Patient states that the CT dept told her that she could not have contrast with her blood sugars being high. She wants to know if she can get the CT or not? Dr. [name], please advise.

## 2015-07-13 NOTE — Telephone Encounter (Signed)
Currently taking 15 units once daily.  Increase to 22 units once daily.

## 2015-07-13 NOTE — Telephone Encounter (Signed)
Please advise 

## 2015-07-13 NOTE — Telephone Encounter (Signed)
D/w Dr Quay Burow 07/13/2015 4:42 PM - she saw Terese Dishman only for first time last week and started on insulin. She wil keep close eye on patient and get sugars under control. Please postpone CT contrast to next month

## 2015-07-13 NOTE — Telephone Encounter (Signed)
Ok I did not know this fact. WEll it is to follow her mediastinal nodes. So she needs contrast. We need her sugars well controlled. Can you get hold of her PCP Binnie Rail, MD today please for me. PCP can call my cell    Current outpatient prescriptions:  .  albuterol (PROVENTIL) (2.5 MG/3ML) 0.083% nebulizer solution, USE 3 ML'S IN NEBULIZER EVERY 6 HOURS AS NEEDED FOR WHEEZING, Disp: 100 vial, Rfl: 2 .  albuterol (VENTOLIN HFA) 108 (90 Base) MCG/ACT inhaler, INHALE 2 PUFFS INTO THE LUNGS EVERY 6 HOURS AS NEEDED, Disp: 3 Inhaler, Rfl: 1 .  ALPRAZolam (XANAX) 0.5 MG tablet, TAKE 1 TABLET TWICE A DAY AS NEEDED FOR ANXIETY, Disp: 60 tablet, Rfl: 0 .  budesonide-formoterol (SYMBICORT) 160-4.5 MCG/ACT inhaler, Inhale 2 puffs into the lungs 2 (two) times daily., Disp: 3 Inhaler, Rfl: 1 .  cloNIDine (CATAPRES) 0.1 MG tablet, Take 1 tablet (0.1 mg total) by mouth 2 (two) times daily., Disp: 180 tablet, Rfl: 3 .  clotrimazole (MYCELEX) 10 MG troche, Take 1 tablet (10 mg total) by mouth 5 (five) times daily., Disp: 35 tablet, Rfl: 0 .  famotidine (PEPCID) 20 MG tablet, Take 1 tablet (20 mg total) by mouth at bedtime., Disp: 90 tablet, Rfl: 3 .  fluticasone (FLONASE) 50 MCG/ACT nasal spray, Place 2 sprays into both nostrils daily as needed for allergies or rhinitis., Disp: 16 g, Rfl: 11 .  Insulin Glargine (LANTUS SOLOSTAR) 100 UNIT/ML Solostar Pen, Inject 15 Units into the skin daily at 10 pm., Disp: 5 pen, Rfl: PRN .  Insulin Pen Needle (PEN NEEDLES) 32G X 4 MM MISC, 15 Units by Other route at bedtime. -- use with insulin, Disp: 30 each, Rfl: 1 .  losartan (COZAAR) 25 MG tablet, Take 1 tablet (25 mg total) by mouth daily., Disp: 90 tablet, Rfl: 3 .  metFORMIN (GLUCOPHAGE) 500 MG tablet, Take 1 tablet (500 mg total) by mouth 2 (two) times daily with a meal., Disp: 60 tablet, Rfl: 1 .  metoprolol tartrate (LOPRESSOR) 25 MG tablet, Take 0.5 tablets (12.5 mg total) by mouth 2 (two) times daily., Disp: 90  tablet, Rfl: 3 .  omeprazole (PRILOSEC) 20 MG capsule, Take 1 capsule (20 mg total) by mouth daily., Disp: 90 capsule, Rfl: 3 .  predniSONE (DELTASONE) 10 MG tablet, 3 tabs daily for 2 weeks,  then 2 tabs daily -hold at this dose., Disp: 75 tablet, Rfl: 1 .  simvastatin (ZOCOR) 10 MG tablet, Take 2 tablets (20 mg total) by mouth daily at 6 PM., Disp: 180 tablet, Rfl: 1 .  tiotropium (SPIRIVA HANDIHALER) 18 MCG inhalation capsule, PLACE 1 CAPSULE INTO HANDIHALER AND INHALE EVERY DAY, Disp: 90 capsule, Rfl: 3

## 2015-07-13 NOTE — Telephone Encounter (Signed)
Spoke with pt to inform.  

## 2015-07-13 NOTE — Telephone Encounter (Signed)
[id number] Dr. Cecille Amsterdam  Can Dr. Quay Burow please Call this doctor on his cell. He needs to speak with her about this patient. Thank you.

## 2015-07-13 NOTE — Telephone Encounter (Signed)
On 07/10/15, we increased to 20 units. Do you want to still do 22 units? Please advise.

## 2015-07-13 NOTE — Telephone Encounter (Signed)
LMTCB

## 2015-07-13 NOTE — Telephone Encounter (Signed)
Called Billey Gosling, MD office and left message for her to contact Dr. [name] on his cell phone.

## 2015-07-14 MED ORDER — OMEPRAZOLE 20 MG PO CPDR: 20.0000 mg | DELAYED_RELEASE_CAPSULE | Freq: Every day | ORAL | Status: DC

## 2015-07-17 ENCOUNTER — Encounter: Payer: Self-pay | Admitting: Internal Medicine

## 2015-07-17 ENCOUNTER — Ambulatory Visit (INDEPENDENT_AMBULATORY_CARE_PROVIDER_SITE_OTHER): Payer: Medicare Other | Admitting: Internal Medicine

## 2015-07-17 VITALS — BP 90/62 | HR 90 | Temp 97.6°F | Resp 20 | Wt 216.0 lb

## 2015-07-17 DIAGNOSIS — Z794 Long term (current) use of insulin: Secondary | ICD-10-CM | POA: Diagnosis not present

## 2015-07-17 DIAGNOSIS — E119 Type 2 diabetes mellitus without complications: Secondary | ICD-10-CM | POA: Diagnosis not present

## 2015-07-17 MED ORDER — INSULIN GLARGINE 100 UNIT/ML SOLOSTAR PEN: 32.0000 [IU] | PEN_INJECTOR | Freq: Every day | SUBCUTANEOUS | Status: DC

## 2015-07-17 NOTE — Progress Notes (Signed)
Subjective:    Patient ID: [name], female    DOB: [birth year], 70 y.o.   MRN: [id number]  HPI She is here for follow up - last week we started her on insulin and she is here for follow up.  Diabetes:  We increased her insulin since she was here last and she is currently taking 27 units daily.  Her sugar this morning 197, last week it was 200-332.  She does not feel like eating because she was told not to eat any white foods or sugars.  She has been doing much better with watching what she is eating.  She has lost some weight.   She would like to go back to pulmonary rehab when she is able but her sugar needs to be less than 200 in order for her to go back.    Medications and allergies reviewed with patient and updated if appropriate.  Patient Active Problem List   Diagnosis Date Noted  . Oral candidiasis 06/02/2015  . 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 Diabetes ([place]) 01/23/2015  . Fatigue 01/20/2015  . COLD (chronic obstructive lung disease) ([place]) 10/18/2014  . Generalized anxiety disorder 03/15/2013  . Obesity, unspecified 03/15/2013  . Chronic diastolic heart failure ([place]) 03/14/2013  . Chronic respiratory failure ([place]) 06/30/2012  . COPD (chronic obstructive pulmonary disease) ([place])   . Tobacco abuse   . Pulmonary nodule 03/22/2010  . 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 Bronchiectasis 03/22/2010  . Hyperlipidemia 06/13/2008  . Essential hypertension 03/06/2007  . ADJUSTMENT DISORDER WITH ANXIETY 02/20/2007  . MITRAL VALVE PROLAPSE 02/20/2007  . CERVICAL CANCER, HX OF 02/20/2007    Current Outpatient Prescriptions on File Prior to Visit  Medication Sig Dispense Refill  . albuterol (PROVENTIL) (2.5 MG/3ML) 0.083% nebulizer solution USE 3 ML'S IN NEBULIZER EVERY 6 HOURS AS NEEDED FOR WHEEZING 100 vial 2  . albuterol (VENTOLIN HFA) 108 (90 Base) MCG/ACT inhaler INHALE 2 PUFFS INTO THE LUNGS EVERY 6 HOURS AS NEEDED 3 Inhaler 1  . ALPRAZolam (XANAX) 0.5 MG tablet TAKE 1  TABLET TWICE A DAY AS NEEDED FOR ANXIETY 60 tablet 0  . budesonide-formoterol (SYMBICORT) 160-4.5 MCG/ACT inhaler Inhale 2 puffs into the lungs 2 (two) times daily. 3 Inhaler 1  . cloNIDine (CATAPRES) 0.1 MG tablet Take 1 tablet (0.1 mg total) by mouth 2 (two) times daily. 180 tablet 3  . clotrimazole (MYCELEX) 10 MG troche Take 1 tablet (10 mg total) by mouth 5 (five) times daily. 35 tablet 0  . famotidine (PEPCID) 20 MG tablet Take 1 tablet (20 mg total) by mouth at bedtime. 90 tablet 3  . fluticasone (FLONASE) 50 MCG/ACT nasal spray Place 2 sprays into both nostrils daily as needed for allergies or rhinitis. 16 g 11  . Insulin Glargine (LANTUS SOLOSTAR) 100 UNIT/ML Solostar Pen Inject 22 Units into the skin daily at 10 pm. 5 pen PRN  . Insulin Pen Needle (PEN NEEDLES) 32G X 4 MM MISC 15 Units by Other route at bedtime. -- use with insulin 30 each 1  . losartan (COZAAR) 25 MG tablet Take 1 tablet (25 mg total) by mouth daily. 90 tablet 3  . metFORMIN (GLUCOPHAGE) 500 MG tablet Take 1 tablet (500 mg total) by mouth 2 (two) times daily with a meal. 60 tablet 1  . metoprolol tartrate (LOPRESSOR) 25 MG tablet Take 0.5 tablets (12.5 mg total) by mouth 2 (two) times daily. 90 tablet 3  . omeprazole (PRILOSEC) 20 MG capsule Take 1 capsule (20  mg total) by mouth daily. 90 capsule 3  . omeprazole (PRILOSEC) 20 MG capsule Take 1 capsule (20 mg total) by mouth daily. 90 capsule 3  . predniSONE (DELTASONE) 10 MG tablet 3 tabs daily for 2 weeks,  then 2 tabs daily -hold at this dose. 75 tablet 1  . simvastatin (ZOCOR) 10 MG tablet Take 2 tablets (20 mg total) by mouth daily at 6 PM. 180 tablet 1  . tiotropium (SPIRIVA HANDIHALER) 18 MCG inhalation capsule PLACE 1 CAPSULE INTO HANDIHALER AND INHALE EVERY DAY 90 capsule 3   No current facility-administered medications on file prior to visit.    Past Medical History  Diagnosis Date  . Hypertension   . COPD (chronic obstructive pulmonary disease) ([place])       FEV-1 in 2008 was 63% with a diffusion capacity of 33%.   . Tobacco abuse      Smokes one pack a day since age 8.  Marland Kitchen History of cervical cancer 1982  . Pulmonary nodule march 2012    31mm RUL and RLL 1st seen march 2012 CT, ?progression 12/2012 CT  . 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 Bronchiectasis march 2012    On CT chest. RUL. Mild  . Medical non-compliance   . Chicken pox   . Seasonal allergies   . Arrhythmia   . Hyperlipidemia   . Tobacco abuse   . Chronic diastolic heart failure ([place])   . Chronic respiratory failure ([place])     Followed in Pulmonary clinic/ [facility]/ Ramaswamy  - 06/30/2012 desat to 86%  RA walking 50 ft, recovered to 90% at rest - 06/30/2012  Walked 1lpm x 3 laps @ 185 ft each stopped due to  Sob, no desat  rec 02 2lpm with activity and sleeping, ok at rest   . Generalized anxiety disorder   . Leg swelling     Venous doppler right 07/21/12 >>Neg    . MITRAL VALVE PROLAPSE   . Obesity, unspecified   . CHF (congestive heart failure) [facility])     Past Surgical History  Procedure Laterality Date  . Total abdominal hysterectomy      Social History   Social History  . Marital Status: Divorced    Spouse Name: N/A  . Number of Children: N/A  . 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 Smoking status: Current Every Day Smoker -- 0.50 packs/day for 44 years    Types: Cigarettes  . Smokeless tobacco: Never Used     Comment: down to 4 cigs per day plus 5 ecigs with nicotien daily//3.14.17  . Alcohol Use: 0.0 oz/week    0 Standard drinks or equivalent per week     Comment: occasional  . Drug Use: No  . Sexual Activity: Not Asked   Other Topics Concern  . None   Social History Narrative   Married but recently separated from husband    Family History  Problem Relation Age of Onset  . Other Father     MVA  . Esophageal cancer Mother      Review of Systems  Constitutional: Negative for fever and chills.  Respiratory: Positive for shortness of breath. Negative for cough and wheezing.   Cardiovascular: Negative for chest pain.  Neurological: Positive for headaches (when hungry). Negative for light-headedness.       Objective:  Filed Vitals:   07/17/15 1343  BP: 90/62  Pulse: 90  Temp: 97.6 F (36.4 C)  Resp: 20   Filed Weights   07/17/15 1343  Weight: 216 lb (97.977 kg)   Body mass index is 37.06 kg/(m^2).   Physical Exam Constitutional: Appears well-developed and well-nourished. No distress.   Cardiovascular: Normal rate, regular rhythm and normal heart sounds.   No murmur heard.  No edema Pulmonary/Chest: Effort normal and breath sounds diffusely decreased. No respiratory distress. No wheezes.       Assessment & Plan:   See Problem List for Assessment and Plan of chronic medical problems.   F/u 3 months

## 2015-07-17 NOTE — Progress Notes (Signed)
Pre visit review using our clinic review tool, if applicable. No additional management support is needed unless otherwise documented below in the visit note. 

## 2015-07-17 NOTE — Assessment & Plan Note (Signed)
Sugars still elevated - related to her being on prednisone for COPD Increase lantus to 32 units daily - she will start that tonight Discussed diet - ok to eat some carbs, but only small amounts.  No sugars, brown better than white when possible, but can not avoid carbs completely Follow up in 3 months, sooner if needed

## 2015-07-17 NOTE — Patient Instructions (Addendum)
Increase your insulin to 32 units daily at night.    Follow up 3 months

## 2015-07-18 ENCOUNTER — Telehealth: Payer: Self-pay | Admitting: Internal Medicine

## 2015-07-18 ENCOUNTER — Telehealth (HOSPITAL_COMMUNITY): Payer: Self-pay | Admitting: *Deleted

## 2015-07-18 DIAGNOSIS — R911 Solitary pulmonary nodule: Secondary | ICD-10-CM

## 2015-07-18 NOTE — Telephone Encounter (Signed)
Spoke with pt regarding her blood sugar and prednisone. Pt states that she only has 5 tabs of 10mg  left. Per OV note on 06/02/15 pt was to continue 20mg  daily until ROV in 4-6 weeks. This appt had to be canceled due to [place]. Pt was offered appt with TP but per pt Dr Roxy Horseman advised her she was to only see MD, not NP/PA so she refused to make appt. Pt also wants to know when CT can be scheduled. Advised pt that MR recommended CT be held for another month to try to get her blood sugar under control. Pt is very upset and wants to know "why she has to walk around not knowing if she has cancer in her lung". Advised pt that first we need to address prednisone, then possibly obtain CT once BS levels better controlled. Pt still very frustrated. Appt scheduled with MR for 09/12/15.  MR please advise as to prednisone - dosing and continuance. Thanks!

## 2015-07-18 NOTE — Telephone Encounter (Signed)
Called spoke with pt. Reviewed results and recs. I scheduled pt with TP on 08/21/15. I offered sooner ov but pt refused stating she needed an afternoon ov. CT order has been placed. Nothing further needed.

## 2015-07-18 NOTE — Telephone Encounter (Signed)
I can understand her frustration. Ufnortunately she is high risk for surgery; we can get her to [facility] for 2nd opinion on risk if she wants. But for now cut prednisone to 15mg  daily to continue. Her PCP Binnie Rail, MD is working aggressively in trying to control sugars. Let her know in interest of time she should get CT chest in mid-July and see TP in mid-July. I will be in close touch with TP and so from my perspective ok to see APP.More important we are on top of this and then wait to see me end of august  Dr. Brand Males, M.D., [facility].C.P Pulmonary and Critical Care Medicine Staff Physician [facility] Pager: [phone], If no answer or between  15:00h - 7:00h: call [phone]  07/18/2015 1:57 PM

## 2015-07-18 NOTE — Progress Notes (Signed)
Nutrition Note Spoke with pt via telephone. Pt states Metformin stopped due to diarrhea. Pt Lantus increased from 27 units q pm to 32 units q pm yesterday. Fasting CBG: 07/17/15 197 mg/dL and 07/18/15 181 mg/dL. Pt told this writer her MD wants her fasting CBG's to be in the 150's to resume pulmonary rehab. Pt stated "I hope my blood sugars improve when I get off of this prednisone." Pt reports taking 20 mg Prednisone and having 5 pills left. Pt educated re: prednisone needing to be tappered off and not stopped immediately. Pt plans on calling her MD to refill her Rx. Pt expressed understanding of the information reviewed. Continue client-centered nutrition education by RD as part of interdisciplinary care.  Monitor and evaluate progress toward nutrition goal with team.  Derek Mound, M.Ed, RD, LDN, CDE 07/18/2015 1:50 PM

## 2015-07-20 ENCOUNTER — Telehealth: Payer: Self-pay

## 2015-07-20 MED ORDER — INSULIN GLARGINE 100 UNIT/ML SOLOSTAR PEN: 32.0000 [IU] | PEN_INJECTOR | Freq: Every day | SUBCUTANEOUS | Status: DC

## 2015-07-20 MED ORDER — GLUCOSE BLOOD VI STRP: ORAL_STRIP | Status: DC

## 2015-07-20 MED ORDER — GLUCOCOM LANCETS 33G MISC: Status: AC

## 2015-07-20 MED ORDER — LOSARTAN POTASSIUM 25 MG PO TABS: 25.0000 mg | ORAL_TABLET | Freq: Every day | ORAL | Status: DC

## 2015-07-20 NOTE — Telephone Encounter (Signed)
Patient states pharmacy did not received test strips and lancets.  Please follow up with patient.  Patient states she only has enough for tomorrow.

## 2015-07-20 NOTE — Telephone Encounter (Signed)
Spoke with pt, verified glucometer, Contour next, RX sent to  [facility]

## 2015-07-20 NOTE — Telephone Encounter (Signed)
Patient called and is requesting a rx for lantus and test strips.

## 2015-07-21 ENCOUNTER — Ambulatory Visit: Payer: Medicare Other | Admitting: Internal Medicine

## 2015-07-21 ENCOUNTER — Inpatient Hospital Stay: Admission: RE | Admit: 2015-07-21 | Payer: Medicare Other | Source: Ambulatory Visit

## 2015-07-24 ENCOUNTER — Other Ambulatory Visit: Payer: Self-pay | Admitting: Internal Medicine

## 2015-07-24 NOTE — Telephone Encounter (Signed)
MD out of office pls advise on refill.../lmb 

## 2015-07-24 NOTE — Telephone Encounter (Signed)
[facility].../lmb 

## 2015-07-28 ENCOUNTER — Telehealth: Payer: Self-pay | Admitting: Internal Medicine

## 2015-07-28 NOTE — Telephone Encounter (Signed)
Increase to 35 units daily

## 2015-07-28 NOTE — Telephone Encounter (Signed)
Please advise of any changes that need to be made.

## 2015-07-28 NOTE — Telephone Encounter (Signed)
Pt called in and wanted to give you blood levels   last night-201 Today -158

## 2015-07-31 NOTE — Telephone Encounter (Signed)
LVM letting pt know.  

## 2015-08-01 ENCOUNTER — Encounter: Payer: Medicare Other | Admitting: Cardiothoracic Surgery

## 2015-08-07 ENCOUNTER — Encounter (HOSPITAL_COMMUNITY): Payer: Self-pay | Admitting: *Deleted

## 2015-08-07 ENCOUNTER — Telehealth (HOSPITAL_COMMUNITY): Payer: Self-pay | Admitting: *Deleted

## 2015-08-07 DIAGNOSIS — J438 Other emphysema: Secondary | ICD-10-CM

## 2015-08-07 NOTE — Progress Notes (Signed)
Pulmonary Rehab Note  [name] is being discharged from pulmonary rehab after attending 22 exercise sessions.  She has been absent for 4 weeks due to new onset and uncontrolled diabetes.  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  She is being treated by her PCP, Dr. Quay Burow and her CBG's are much improved.  She is to have a CT scan of her chest in 2 days and then has an appointment with Dr. Servando Snare next week to see if she is a candidate for a biopsy to determine if she has lymphoma versus sarcoidosis.  Her exercise level remained very low and she was greatly affected with low oxygen saturations and extreme SOB with any activity.  She still continues to smoke e-cigarettes.  She has been councilllled that quitting would be advantageous.

## 2015-08-07 NOTE — Progress Notes (Signed)
Discharge Summary  Patient Details  Name: Prabhnoor Piechocki MRN: [id number] Date of Birth: September 29, 1945 Referring Provider:     Number of Visits: 22  Reason for Discharge:  Early Exit:  Uncontrolled diabetes and 4 week absence, diabetes is controlled now, had 1 exercise session left.  Smoking History:  History  Smoking status  . Current Every Day Smoker -- 0.50 packs/day for 44 years  . Types: Cigarettes  Smokeless tobacco  . Never Used    Comment: down to 4 cigs per day plus 5 ecigs with nicotien daily//3.14.17    Diagnosis:  Other emphysema ([place])  ADL [facility]SD:   Initial Exercise Prescription:   Discharge Exercise Prescription (Final Exercise Prescription Changes):     Exercise Prescription Changes - 07/04/15 1500    Response to Exercise   Blood Pressure (Admit) 104/60 mmHg   Heart Rate (Admit) 99 bpm   Heart Rate (Exercise) 103 bpm   Oxygen Saturation (Admit) 83 %  increased to 88 with PLB   Oxygen Saturation (Exercise) 85 %   Rating of Perceived Exertion (Exercise) 11   Perceived Dyspnea (Exercise) 1   Resistance Training   Training Prescription Yes   Weight orange bands   Reps 10-12   Interval Training   Interval Training No   Oxygen   Oxygen Continuous   Liters 6   NuStep   Level 3   Minutes 30   METs 1.6      Functional Capacity:   Psychological, QOL, Others - Outcomes: PHQ 2/9: Depression screen [facility] 2/9 01/27/2015 07/12/2013 09/14/2012 08/26/2012  Decreased Interest 1 1 0 0  Down, Depressed, Hopeless 1 1 1 1   PHQ - 2 Score 2 2 1 1   Altered sleeping 1 3 - -  Tired, decreased energy 1 3 - -  Change in appetite 1 2 - -  Feeling bad or failure about yourself  0 2 - -  Trouble concentrating 0 1 - -  Moving slowly or fidgety/restless 0 0 - -  Suicidal thoughts 0 0 - -  PHQ-9 Score 5 13 - -  Difficult doing work/chores Not difficult at all - - -    Quality of Life:   Personal Goals: Goals established at orientation with interventions provided to  work toward goal.    Personal Goals Discharge:   Nutrition & Weight - Outcomes:    Nutrition:     Nutrition Therapy & Goals - 07/07/15 0804    Nutrition Therapy   Diet Carb Modified   Personal Nutrition Goals   Personal Goal #1 Basic understanding of DM diet   Intervention Plan   Intervention Prescribe, educate and counsel regarding individualized specific dietary modifications aiming towards targeted core components such as weight, hypertension, lipid management, diabetes, heart failure and other comorbidities.;Nutrition handout(s) given to patient.  Pt given handouts for DM diet and 1500 kcal, 5 day menu ideas   Expected Outcomes Short Term Goal: Understand basic principles of dietary content, such as calories, fat, sodium, cholesterol and nutrients.;Long Term Goal: Adherence to prescribed nutrition plan.      Nutrition Discharge:   Education Questionnaire Score:   Goals reviewed with patient; copy given to patient.

## 2015-08-09 ENCOUNTER — Ambulatory Visit (INDEPENDENT_AMBULATORY_CARE_PROVIDER_SITE_OTHER)
Admission: RE | Admit: 2015-08-09 | Discharge: 2015-08-09 | Disposition: A | Discharge status: Home / Self Care | Payer: Medicare Other | Source: Ambulatory Visit | Attending: Adult Health | Admitting: Adult Health

## 2015-08-09 DIAGNOSIS — R599 Enlarged lymph nodes, unspecified: Secondary | ICD-10-CM | POA: Diagnosis not present

## 2015-08-09 DIAGNOSIS — R911 Solitary pulmonary nodule: Secondary | ICD-10-CM

## 2015-08-15 ENCOUNTER — Encounter: Payer: Self-pay | Admitting: Cardiothoracic Surgery

## 2015-08-15 ENCOUNTER — Ambulatory Visit (INDEPENDENT_AMBULATORY_CARE_PROVIDER_SITE_OTHER): Payer: Medicare Other | Admitting: Cardiothoracic Surgery

## 2015-08-15 VITALS — BP 93/59 | HR 99 | Resp 20 | Ht 64.0 in | Wt 216.0 lb

## 2015-08-15 DIAGNOSIS — J449 Chronic obstructive pulmonary disease, unspecified: Secondary | ICD-10-CM | POA: Diagnosis not present

## 2015-08-15 DIAGNOSIS — R599 Enlarged lymph nodes, unspecified: Secondary | ICD-10-CM

## 2015-08-15 DIAGNOSIS — R590 Localized enlarged lymph nodes: Secondary | ICD-10-CM

## 2015-08-15 NOTE — Progress Notes (Signed)
[address]       [place] [address]             [phone]                    Tamkia Moroney [place] Medical Record [id number] Date of Birth: 1945-03-24  Referring: Brand Males, MD Primary Care: Binnie Rail, MD  Chief Complaint:    Chief Complaint  Patient presents with  . Adenopathy    2 month f/u, Chest CT 7/19/17i    History of Present Illness:    [name] 70 y.o. female is seen in the office  today for Re-consideration  of mediastinoscopy. On the patient's patient's previous presentation to the office today she is in the midst of an acute exacerbation of her severe COPD. She is referred to consider mediastinoscopy to biopsy enlarged mediastinal nodes present on a recent CT scan. Since last seen she has been treated by the pulmonary office and has been on prednisone 40 mg a day  she remains very limited on her physical ability and functional status, on oxygen continuously at 3 L increased to 6 with any activity.   Patient had been enrolled in pulmonary rehabilitation but when her glucoses reached more than 600 she was discharged from rehabilitation and sent to the emergency room.  Patient comes in today with a follow-up CT scan to evaluate her mediastinal adenopathy  Current Activity/ Functional Status:  Patient is not independent with mobility/ambulation, transfers, ADL's, IADL's.   Zubrod Score: At the time of surgery this patient's most appropriate activity status/level should be described as: []     0    Normal activity, no symptoms []     1    Restricted in physical strenuous activity but ambulatory, able to do out light work []     2    Ambulatory and capable of self care, unable to do work activities, up and about               >50 % of waking hours                              [x]     3    Only limited self care, in bed greater than 50% of waking hours []     4    Completely disabled, no self care, confined to bed or chair []     5     Moribund   Past Medical History:  Diagnosis Date  . Arrhythmia   . Bronchiectasis march 2012   On CT chest. RUL. Mild  . CHF (congestive heart failure) ([place])   . Chicken pox   . Chronic diastolic heart failure ([place])   . Chronic respiratory failure ([place])    Followed in Pulmonary clinic/ [facility]/ Ramaswamy  - 06/30/2012 desat to 86%  RA walking 50 ft, recovered to 90% at rest - 06/30/2012  Walked 1lpm x 3 laps @ 185 ft each stopped due to  Sob, no desat  rec 02 2lpm with activity and sleeping, ok at rest   . COPD (chronic obstructive pulmonary disease) ([place])     FEV-1 in 2008 was 63% with a diffusion capacity of 33%.   . Generalized anxiety disorder   . History of cervical cancer 1982  . Hyperlipidemia   . Hypertension   . Leg swelling    Venous doppler right 07/21/12 >>Neg    .  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 Medical non-compliance   . MITRAL VALVE PROLAPSE   . Obesity, unspecified   . Pulmonary nodule march 2012   66mm RUL and RLL 1st seen march 2012 CT, ?progression 12/2012 CT  . Seasonal allergies   . Tobacco abuse     Smokes one pack a day since age 15.  . Tobacco abuse     Past Surgical History:  Procedure Laterality Date  . TOTAL ABDOMINAL HYSTERECTOMY      Family History  Problem Relation Age of Onset  . Other Father     MVA  . Esophageal cancer Mother     Social History   Social History  . Marital status: Divorced    Spouse name: N/A  . Number of children: N/A  . Years of education: N/A   Occupational History  . works at a call center    Social History Main Topics  . Smoking status: Current Every Day Smoker    Packs/day: 0.50    Years: 44.00    Types: Cigarettes  . Smokeless tobacco: Never Used     Comment: down to 4 cigs per day plus 5 ecigs with nicotien daily//3.14.17  . Alcohol use 0.0 oz/week     Comment: occasional  . Drug use: No  . Sexual activity: Not on file   Other Topics  Concern  . Not on file   Social History Narrative   Married but recently separated from husband    History  Smoking Status  . Current Every Day Smoker  . Packs/day: 0.50  . Years: 44.00  . Types: Cigarettes  Smokeless Tobacco  . Never Used    Comment: down to 4 cigs per day plus 5 ecigs with nicotien daily//3.14.17    History  Alcohol Use  . 0.0 oz/week    Comment: occasional     Allergies  Allergen Reactions  . Ambien [Zolpidem Tartrate] Other (See Comments)    Causes nightmares    Current Outpatient Prescriptions  Medication Sig Dispense Refill  . albuterol (PROVENTIL) (2.5 MG/3ML) 0.083% nebulizer solution USE 3 ML'S IN NEBULIZER EVERY 6 HOURS AS NEEDED FOR WHEEZING 100 vial 2  . albuterol (VENTOLIN HFA) 108 (90 Base) MCG/ACT inhaler INHALE 2 PUFFS INTO THE LUNGS EVERY 6 HOURS AS NEEDED 3 Inhaler 1  . ALPRAZolam (XANAX) 0.5 MG tablet TAKE 1 TABLET BY MOUTH TWICE A DAY AS NEEDED FOR ANXIETY 60 tablet 0  . budesonide-formoterol (SYMBICORT) 160-4.5 MCG/ACT inhaler Inhale 2 puffs into the lungs 2 (two) times daily. 3 Inhaler 1  . cloNIDine (CATAPRES) 0.1 MG tablet Take 1 tablet (0.1 mg total) by mouth 2 (two) times daily. 180 tablet 3  . famotidine (PEPCID) 20 MG tablet Take 1 tablet (20 mg total) by mouth at bedtime. 90 tablet 3  . fluticasone (FLONASE) 50 MCG/ACT nasal spray Place 2 sprays into both nostrils daily as needed for allergies or rhinitis. 16 g 11  . GLUCOCOM LANCETS 33G MISC Use with Test Strips to take blood sugars 100 each 5  . glucose blood (BAYER CONTOUR NEXT TEST) test strip Use to take blood sugars twice daily. 100 each 3  . Insulin Glargine (LANTUS SOLOSTAR) 100 UNIT/ML Solostar Pen Inject 32 Units into the skin daily at 10 pm. 5 pen 11  . Insulin Pen Needle (PEN NEEDLES) 32G X 4 MM MISC 15 Units by Other route at bedtime. -- use with insulin 30  each 1  . losartan (COZAAR) 25 MG tablet Take 1 tablet (25 mg total) by mouth daily. 90 tablet 3  .  metoprolol tartrate (LOPRESSOR) 25 MG tablet Take 0.5 tablets (12.5 mg total) by mouth 2 (two) times daily. 90 tablet 3  . omeprazole (PRILOSEC) 20 MG capsule Take 1 capsule (20 mg total) by mouth daily. 90 capsule 3  . predniSONE (DELTASONE) 10 MG tablet 3 tabs daily for 2 weeks,  then 2 tabs daily -hold at this dose. (Patient taking differently: 15 mg daily. Currently taking 15 mg orally per day.) 75 tablet 1  . simvastatin (ZOCOR) 10 MG tablet Take 2 tablets (20 mg total) by mouth daily at 6 PM. 180 tablet 1  . tiotropium (SPIRIVA HANDIHALER) 18 MCG inhalation capsule PLACE 1 CAPSULE INTO HANDIHALER AND INHALE EVERY DAY 90 capsule 3  . clotrimazole (MYCELEX) 10 MG troche Take 1 tablet (10 mg total) by mouth 5 (five) times daily. (Patient not taking: Reported on 08/15/2015) 35 tablet 0   No current facility-administered medications for this visit.       Review of Systems:     Cardiac Review of Systems: Y or N  Chest Pain [ n   ]  Resting SOB [ y  ] Exertional SOB  [y]  Orthopnea [ y ]   Pedal Edema Blue.Reese   ]    Palpitations [  n] Syncope  [ n ]   Presyncope [  n ]  General Review of Systems: [Y] = yes [  ]=no Constitional: recent weight change [  ];  Wt loss over the last 3 months [   ] anorexia [  ]; fatigue Blue.Reese  ]; nausea [  ]; night sweats [  ]; fever [  ]; or chills [  ];          Dental: poor dentition[  ]; Last Dentist visit:   Eye : blurred vision [  ]; diplopia [   ]; vision changes [  ];  Amaurosis fugax[  ]; Resp: cough Blue.Reese  ];  wheezing[ y ];  hemoptysis[n  ]; shortness of breath[y  ]; paroxysmal nocturnal dyspnea[y  ]; dyspnea on exertion[ y ]; or orthopnea[  ];  GI:  gallstones[  ], vomiting[  ];  dysphagia[  ]; melena[  ];  hematochezia [  ]; heartburn[  ];   Hx of  Colonoscopy[  ]; GU: kidney stones [  ]; hematuria[  ];   dysuria [  ];  nocturia[  ];  history of     obstruction [  ]; urinary frequency [  ]             Skin: rash, swelling[  ];, hair loss[  ];  peripheral edema[  ];   or itching[  ]; Musculosketetal: myalgias[  ];  joint swelling[  ];  joint erythema[  ];  joint pain[  ];  back pain[  ];  Heme/Lymph: bruising[  ];  bleeding[  ];  anemia[  ];  Neuro: TIA[  ];  headaches[  ];  stroke[  ];  vertigo[  ];  seizures[ n ];   paresthesias[  ];  difficulty walking[  ];  Psych:depression[  ]; Sylvester Harder  ];  Endocrine: diabetes[  ];  thyroid dysfunction[  ];  Immunizations: Flu up to date [  ]; Pneumococcal up to date [  ];  Other:  Physical Exam: BP (!) 93/59 (BP Location: Left Arm, Patient Position: Sitting, Cuff Size: Normal)  Pulse 99   Resp 20   Ht 5\' 4"  (1.626 m)   Wt 216 lb (98 kg)   BMI 37.08 kg/m   PHYSICAL EXAMINATION: General appearance: alert, cooperative, fatigued and mild distress Head: Normocephalic, without obvious abnormality, atraumatic Neck: no adenopathy, no carotid bruit, no JVD, supple, symmetrical, trachea midline and thyroid not enlarged, symmetric, no tenderness/mass/nodules Lymph nodes: Cervical, supraclavicular, and axillary nodes normal. Resp: diminished breath sounds bilaterally, there is no active wheezing but the patient moves very little air on exam, she is on 3 L of oxygen in the office with sats at 90 at rest Back: symmetric, no curvature. ROM normal. No CVA tenderness. Cardio: regular rate and rhythm, S1, S2 normal, no murmur, click, rub or gallop GI: soft, non-tender; bowel sounds normal; no masses,  no organomegaly Extremities: extremities normal, atraumatic, no cyanosis or edema and Homans sign is negative, no sign of DVT Neurologic: Grossly normal  Diagnostic Studies & Laboratory data:     Recent Radiology Findings:  Ct Chest Wo Contrast  Result Date: 08/09/2015 CLINICAL DATA:  Follow-up FDG avid mediastinal and bilateral hilar lymphadenopathy, for which surgical evaluation was deferred due to COPD exacerbation. EXAM: CT CHEST WITHOUT CONTRAST TECHNIQUE: Multidetector CT imaging of the chest was performed following  the standard protocol without IV contrast. COMPARISON:  04/03/2015 PET-CT and 03/21/2015 chest CT. FINDINGS: Mediastinum/Nodes: Normal heart size. Stable trace pericardial fluid/thickening. Left anterior descending coronary atherosclerosis. Atherosclerotic nonaneurysmal thoracic aorta. 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 Normal caliber pulmonary arteries. Stable hypodense 0.7 cm left thyroid lobe nodule. Unremarkable esophagus. No axillary adenopathy. Enlarged 1.5 cm right lower paratracheal node (series 2/image 50), decreased from 2.0 cm. Mildly enlarged 1.2 cm subcarinal node (series 2/ image 68), previously 1.9 cm, decreased. Enlarged 1.2 cm AP window node (series 2/image 50), previously 1.3 cm, slightly decreased. Mild bilateral hilar lymphadenopathy is slightly decreased, measuring up to 1.4 cm on the right (series 2/ image 83), slightly decreased from 1.5 cm and measuring up to 1.2 cm on the left (series 2/ image 76), slightly decreased from 1.3 cm. Lungs/Pleura: No pneumothorax. No pleural effusion. Moderate to severe centrilobular emphysema with diffuse bronchial wall thickening. Scattered tiny pulmonary nodules in the right middle and right upper lobes measuring up to 5 mm (for example series 3/ image 58 in the anterior right upper lobe) are all unchanged since at least 06/25/2012 and considered benign. No acute consolidative airspace disease, new significant pulmonary nodules or lung masses. Upper abdomen: Diffuse hepatic steatosis. Musculoskeletal: No aggressive appearing focal osseous lesions. Mild thoracic spondylosis. IMPRESSION: 1. Mild to moderate mediastinal and bilateral hilar lymphadenopathy is mildly decreased since 03/21/2015. Potential etiologies include reactive adenopathy, sarcoidosis or less likely lymphoma or metastatic disease given the decreased size. 2. No acute pulmonary disease. 3.  Moderate to severe emphysema diffuse bronchial wall thickening, consistent with the provided history of COPD. 4. Aortic atherosclerosis. One vessel coronary atherosclerosis. Aberrant right subclavian artery. 5. Diffuse hepatic steatosis. Electronically Signed   By: Ilona Sorrel M.D.   On: 08/09/2015 15:07    Nm Pet Image Initial (pi) Skull Base To Thigh  04/03/2015  CLINICAL DATA:  Initial treatment strategy for mediastinal and hilar lymph nodes. Cervical cancer. EXAM: NUCLEAR MEDICINE PET SKULL BASE TO THIGH TECHNIQUE: 11.0 mCi F-18 FDG was injected intravenously. Full-ring PET imaging was performed from the skull base to thigh after the radiotracer. CT data was obtained and used for  attenuation correction and anatomic localization. FASTING BLOOD GLUCOSE:  Value: 125 mg/dl COMPARISON:  CT chest 03/21/2015 and 01/11/2013. FINDINGS: NECK A 6 mm nodule posterior to the right parotid gland (CT image 21) has an SUV max of 4.3. No additional hypermetabolic lymph nodes in the neck. Scattered muscular uptake is likely physiologic. Thyroid is mildly hypermetabolic, nonspecific. CT images show no acute findings. CHEST There is hypermetabolic mediastinal, bi hilar and left axillary adenopathy. Index low right paratracheal lymph node measures 2.4 cm (CT image 62) with an SUV max of 19.8. Hypermetabolic periesophageal lymph node measures 11 mm (CT image 83) with an SUV max of 22.0. Additional hypermetabolic lymph nodes are seen in the extrapleural space along the medial lower right hemi thorax (CT image 92) and posterior to the IVC (CT image 92). Patchy ground-glass in the right upper lobe is mildly hypermetabolic (series 6, CT image 21) and likely postinflammatory/ postinfectious in etiology. No hypermetabolic pulmonary nodules. Heart is mildly enlarged. No pericardial or pleural effusion. ABDOMEN/PELVIS No abnormal hypermetabolism in the liver, adrenal glands, spleen or pancreas. No hypermetabolic lymph nodes. CT images  show low-attenuation throughout the liver, with areas of peripheral sparing. Gallbladder and adrenal glands are unremarkable. Stones are seen in the right kidney. Kidneys, spleen, pancreas, stomach and bowel are otherwise grossly unremarkable. Periumbilical hernia contains fat. No free fluid. SKELETON No abnormal osseous hypermetabolism. IMPRESSION: 1. Intensely hypermetabolic adenopathy in the chest is highly worrisome for lymphoma. Sub cm lymph node posterior to the right parotid gland is hypermetabolic and may be involved as well. 2. Hepatic steatosis. 3. Right renal stones. Electronically Signed   By: Lorin Picket M.D.   On: 04/03/2015 14:47     I have independently reviewed the above radiologic studies.  Recent Lab Findings: Lab Results  Component Value Date   WBC 6.1 07/04/2015   HGB 14.6 07/04/2015   HCT 46.6 (H) 07/04/2015   PLT 248 07/04/2015   GLUCOSE 519 (H) 07/04/2015   CHOL 172 01/20/2015   TRIG 165.0 (H) 01/20/2015   HDL 34.30 (L) 01/20/2015   LDLDIRECT 113.0 07/26/2014   LDLCALC 105 (H) 01/20/2015   ALT 30 01/20/2015   AST 35 01/20/2015   NA 134 (L) 07/04/2015   K 3.9 07/04/2015   CL 97 (L) 07/04/2015   CREATININE 0.98 07/04/2015   BUN 9 07/04/2015   CO2 26 07/04/2015   TSH 3.88 01/20/2015   HGBA1C 10.9 07/07/2015      Assessment / Plan:     Mild to moderate mediastinal and bilateral hilar lymphadenopathy is mildly decreased since 03/21/2015 on CT scan  With  severe underlying pulmonary disease- The patient remains in very crippled by her respiratory status, to the point I remain concerned about any type of general anesthesia or operative procedure on her, as it has a high likelihood of resulting in prolonged ventilation.   With the mild to moderate based on hilar adenopathy that's decreased slightly since February I would not recommend proceeding with mediastinoscopy. I discussed this with the patient and she is not interested in any operative procedure. I be  glad to see her back as needed. She will continue to follow with the pulmonary service as an outpatient.   Grace Isaac MD      [address] [place] [address] Office [phone]   Beeper [phone]  08/15/2015 4:31 PM

## 2015-08-16 ENCOUNTER — Telehealth (HOSPITAL_COMMUNITY): Payer: Self-pay | Admitting: *Deleted

## 2015-08-16 ENCOUNTER — Telehealth: Payer: Self-pay | Admitting: Internal Medicine

## 2015-08-16 NOTE — Telephone Encounter (Signed)
Called and spoke with pt and she is aware that of MR last note he wanted her to stay on the prednisone 15 mg daily and follow up with TP. Pt has appt with TP on 7/31.  Advised pt to keep this appt and stay on pred and discuss at this ov.  Pt voiced her understanding and nothing further is needed.

## 2015-08-19 ENCOUNTER — Other Ambulatory Visit: Payer: Self-pay | Admitting: Family

## 2015-08-21 ENCOUNTER — Ambulatory Visit (INDEPENDENT_AMBULATORY_CARE_PROVIDER_SITE_OTHER): Payer: Medicare Other | Admitting: Adult Health

## 2015-08-21 ENCOUNTER — Encounter: Payer: Self-pay | Admitting: Adult Health

## 2015-08-21 DIAGNOSIS — J449 Chronic obstructive pulmonary disease, unspecified: Secondary | ICD-10-CM | POA: Diagnosis not present

## 2015-08-21 DIAGNOSIS — R599 Enlarged lymph nodes, unspecified: Secondary | ICD-10-CM | POA: Diagnosis not present

## 2015-08-21 DIAGNOSIS — J9611 Chronic respiratory failure with hypoxia: Secondary | ICD-10-CM | POA: Diagnosis not present

## 2015-08-21 DIAGNOSIS — R590 Localized enlarged lymph nodes: Secondary | ICD-10-CM

## 2015-08-21 NOTE — Assessment & Plan Note (Signed)
Decrease prednisone 10mg  daily  for 4 weeks then 5 mg daily -hold at this dose until seen back .  Continue on Symbicort and Spiriva , rinse after use.  Work on not smoking  Continue on Oxygen 3l/m rest and 4l/m act  May return back to pulmonary rehab as tolerated.  Follow up with Dr. [name] in 2 months and As needed   Please contact office for sooner follow up if symptoms do not improve or worsen or seek emergency care

## 2015-08-21 NOTE — Assessment & Plan Note (Signed)
?   Etiology , not surgical candidate for bx.  Improved on CT w/ steroids ? Sarcoid  Will taper slowly and consider repeat CT in 3-6 months   Plan  Decrease prednisone 10mg  daily  for 4 weeks then 5 mg daily -hold at this dose until seen back .  Continue on Symbicort and Spiriva , rinse after use.  Work on not smoking  Continue on Oxygen 3l/m rest and 4l/m act  May return back to pulmonary rehab as tolerated.  Follow up with Dr. [name] in 2 months and As needed   Please contact office for sooner follow up if symptoms do not improve or worsen or seek emergency care

## 2015-08-21 NOTE — Assessment & Plan Note (Signed)
Compensated without flare  Smoking cessation   Plan  Decrease prednisone 10mg  daily  for 4 weeks then 5 mg daily -hold at this dose until seen back .  Continue on Symbicort and Spiriva , rinse after use.  Work on not smoking  Continue on Oxygen 3l/m rest and 4l/m act  May return back to pulmonary rehab as tolerated.  Follow up with Dr. [name] in 2 months and As needed   Please contact office for sooner follow up if symptoms do not improve or worsen or seek emergency care

## 2015-08-21 NOTE — Progress Notes (Signed)
Pt was seen in office today. CT was reviewed by TP. Nothing further needed at this time.

## 2015-08-21 NOTE — Progress Notes (Signed)
Subjective:    Patient ID: [name], female    DOB: 1945/08/15, 70 y.o.   MRN: [id number]  HPI 70 yo female with COPD ,O2 dependent,   TEST  FEV-1 in 2008 was 63% with a diffusion capacity of 33% PFTs 78/14 shows gold stage 3 copd with fev1 1.12L/49%, FVC 2.01L/70% - post bd parameters. No bd response, Ratio 60/77%. TLC 87%, DLCO 5/21% Duplex LE - 07/21/12 - negative. BNP 07/16/12 - 25  2 D echo on 03/08/15 showed EF 60-65%, Grade 1 DD , PAP mod increased at 39mmHg.  OV April 2012: Obese female hospitalized in March 2012 and now following. 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 OVerall doing much better since discharge. Using o2. No insurance. Unable to afford xopenex and pulmicort neb. Able to afford atroven neb. Wanting med change. Dysponea is at baseline class 3. Reportedly cut down on smoking to 1 cig/day. Mild pedal edema improving. Admits to snoring and excess day time somnolence  FEV-1 in 2008 was 63% with a diffusion capacity of 33% 2 D echo on 03/08/15 showed EF 60-65%, Grade 1 DD , PAP mod increased at 54mmHg.  CT chest 03/21/15 showed increased mediastinal/bilateral hilar lymphadenopahty worrisome for lymphoma. Unchanged pulmonary nodules x 2 years.  Subsequent PET scan showed intensely hypermetabolic  adenopahty in chest .  Referred to [facility] 03/2015 >too sick for bx.  Walk test in office 05/12/2015 > on 3 L baseline oxygen walking 185 feet 3 laps: She only walk one lap and then she desaturated to 86%. At this point her oxygen was increased to 6 L and then she walk one lap and maintained a pulse ox of 90%. After that she was dyspneic and did not want walk further.      08/21/2015 Follow up : COPD, O2 dependent, Lung nodule  Pt returns for  2 month  follow up  COPD and thoracic lymphadenopathy.  . , CT chest 03/21/15 showed increased mediastinal/bilateral hilar lymphadenopahty worrisome for lymphoma. Unchanged pulmonary nodules x 2 years.  Subsequent PET scan showed intensely hypermetabolic  adenopahty in chest .  She was referred to [facility] , but was too sick w/ COPD exacerbation to proceed  She has been started on higher prednisone taper at prednisone 40mg  daily w/ slow taper.  She returns today feeling better. O2 sats are improved some.  Follow up CT chest 08/09/2015 showed mild to moderate mediastinal and bilateral hilar lymphadenopathy with mild decreased since February 2017. moderate to severe emphysema. She was recently seen by Dr. Pia Mau thoracic surgery that feels patient is too high risk for mediastinoscopy.   Going to pulmonary rehab. On hold right now bc of DM.  Currently on prednisone 15mg  daily (decreased 3 weeks) .  Prevnar and PVX and flu vaccines are utd. .  Remains on On Symbicort and Spiriva .  On oxygen 3 l/m rest and 4l/m act .  Denies chest pain, orthopnea , increased edema , calf pain, hemoptysis .    Past Medical History:  Diagnosis Date  . Arrhythmia   . Bronchiectasis march 2012   On CT chest. RUL. Mild  . CHF (congestive heart failure) ([place])   . Chicken pox   . Chronic diastolic heart failure ([place])   . Chronic respiratory failure ([facility])    Followed in Pulmonary  clinic/ [facility]/ [facility]aswamy  - 06/30/2012 desat to 86%  RA walking 50 ft, recovered to 90% at rest - 06/30/2012  Walked 1lpm x 3 laps @ 185 ft each stopped due to  Sob, no desat  rec 02 2lpm with activity and sleeping, ok at rest   . COPD (chronic obstructive pulmonary disease) ([place])     FEV-1 in 2008 was 63% with a diffusion capacity of 33%.   . Generalized anxiety disorder   . History of cervical cancer 1982  . Hyperlipidemia   . Hypertension   . Leg swelling    Venous doppler right 07/21/12 >>Neg    . 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 Medical non-compliance   . MITRAL VALVE PROLAPSE   . Obesity, unspecified   . Pulmonary nodule march 2012   41mm RUL and RLL 1st seen march 2012 CT, ?progression 12/2012 CT  . Seasonal allergies   . Tobacco abuse     Smokes one pack a day since age 22.  . Tobacco abuse    Current Outpatient Prescriptions on File Prior to Visit  Medication Sig Dispense Refill  . albuterol (PROVENTIL) (2.5 MG/3ML) 0.083% nebulizer solution USE 3 ML'S IN NEBULIZER EVERY 6 HOURS AS NEEDED FOR WHEEZING 100 vial 2  . albuterol (VENTOLIN HFA) 108 (90 Base) MCG/ACT inhaler INHALE 2 PUFFS INTO THE LUNGS EVERY 6 HOURS AS NEEDED 3 Inhaler 1  . ALPRAZolam (XANAX) 0.5 MG tablet TAKE 1 TABLET BY MOUTH TWICE A DAY AS NEEDED FOR ANXIETY 60 tablet 0  . budesonide-formoterol (SYMBICORT) 160-4.5 MCG/ACT inhaler Inhale 2 puffs into the lungs 2 (two) times daily. 3 Inhaler 1  . cloNIDine (CATAPRES) 0.1 MG tablet Take 1 tablet (0.1 mg total) by mouth 2 (two) times daily. 180 tablet 3  . clotrimazole (MYCELEX) 10 MG troche Take 1 tablet (10 mg total) by mouth 5 (five) times daily. 35 tablet 0  . famotidine (PEPCID) 20 MG tablet Take 1 tablet (20 mg total) by mouth at bedtime. 90 tablet 3  . fluticasone (FLONASE) 50 MCG/ACT nasal spray Place 2 sprays into both nostrils daily as needed for allergies or rhinitis. 16 g 11  . GLUCOCOM LANCETS 33G MISC Use with Test Strips to take blood sugars 100 each 5  . glucose blood (BAYER CONTOUR NEXT TEST) test strip Use to take blood sugars twice daily. 100 each 3  . Insulin Glargine (LANTUS SOLOSTAR) 100 UNIT/ML Solostar Pen Inject 32 Units into the skin daily at 10 pm. (Patient taking differently: Inject 35 Units into the skin daily at 10 pm. ) 5 pen 11  . Insulin Pen Needle (PEN NEEDLES) 32G X 4 MM MISC 15 Units by Other route at bedtime. -- use with insulin 30 each 1  . losartan (COZAAR) 25 MG tablet Take 1 tablet (25 mg total) by mouth daily. 90  tablet 3  . metoprolol tartrate (LOPRESSOR) 25 MG tablet Take 0.5 tablets (12.5 mg total) by mouth 2 (two) times daily. 90 tablet 3  . omeprazole (PRILOSEC) 20 MG capsule Take 1 capsule (20 mg total) by mouth daily. 90 capsule 3  . predniSONE (DELTASONE) 10 MG tablet 3 tabs daily for 2 weeks,  then 2 tabs daily -hold at this dose. (Patient taking differently: 15 mg daily. Currently taking 15 mg orally per day.) 75 tablet 1  . simvastatin (ZOCOR) 10 MG tablet Take 2 tablets (20 mg total) by mouth daily at  6 PM. 180 tablet 1  . tiotropium (SPIRIVA HANDIHALER) 18 MCG inhalation capsule PLACE 1 CAPSULE INTO HANDIHALER AND INHALE EVERY DAY 90 capsule 3   No current facility-administered medications on file prior to visit.      Review of Systems Constitutional:   No  weight loss, night sweats,  Fevers, chills,  +fatigue, or  lassitude.  HEENT:   No headaches,  Difficulty swallowing,  Tooth/dental problems, or  Sore throat,                No sneezing, itching, ear ache, nasal congestion, post nasal drip,   CV:  No chest pain,  Orthopnea, PND, swelling in lower extremities, anasarca, dizziness, palpitations, syncope.   GI  No heartburn, indigestion, abdominal pain, nausea, vomiting, diarrhea, change in bowel habits, loss of appetite, bloody stools.   Resp:   No chest wall deformity  Skin: no rash or lesions.  GU: no dysuria, change in color of urine, no urgency or frequency.  No flank pain, no hematuria   MS:  No joint pain or swelling.  No decreased range of motion.  No back pain.  Psych:  No change in mood or affect. No depression or anxiety.  No memory loss.         Objective:   Physical Exam  Vitals:   08/21/15 1636  BP: 118/76  Pulse: 85  SpO2: 91%  Weight: 219 lb (99.3 kg)  Height: 5\' 4"  (1.626 m)    Body mass index is 37.59 kg/m.    GEN: A/Ox3; pleasant , NAD, obese in wc on O2    HEENT:  /AT,  EACs-clear, TMs-wnl, NOSE-clear, THROAT-clear, no lesions, no  postnasal drip or exudate noted.   NECK:  Supple w/ fair ROM; no JVD; normal carotid impulses w/o bruits; no thyromegaly or nodules palpated; no lymphadenopathy.    RESP  Decreased BS in bases  no accessory muscle use, no dullness to percussion  CARD:  RRR, no m/r/g  , tr peripheral edema, pulses intact, no cyanosis or clubbing.  GI:   Soft & nt; nml bowel sounds; no organomegaly or masses detected.   Musco: Warm bil, no deformities or joint swelling noted.   Neuro: alert, no focal deficits noted.    Skin: Warm, no lesions or rashes  Tammy Parrett NP-C  [facility]  08/21/2015

## 2015-08-21 NOTE — Patient Instructions (Addendum)
Decrease prednisone 10mg  daily  for 4 weeks then 5 mg daily -hold at this dose until seen back .  Continue on Symbicort and Spiriva , rinse after use.  Work on not smoking  Continue on Oxygen 3l/m rest and 4l/m act  May return back to pulmonary rehab as tolerated.  Follow up with Dr. [name] in 2 months and As needed   Please contact office for sooner follow up if symptoms do not improve or worsen or seek emergency care

## 2015-08-22 ENCOUNTER — Telehealth: Payer: Self-pay | Admitting: Internal Medicine

## 2015-08-22 NOTE — Telephone Encounter (Signed)
Faxed script back to [facility].../lmb 

## 2015-08-22 NOTE — Telephone Encounter (Signed)
Re-faxed.

## 2015-08-22 NOTE — Telephone Encounter (Signed)
Please refax xanax to patient pharmacy

## 2015-09-07 NOTE — Progress Notes (Signed)
Discharge Summary  Patient Details  Name: [name] MRN: [id number] Date of Birth: Jun 02, 1945 Referring Provider:     Number of Visits: 22  Reason for Discharge:  Early Exitdue to uncontrolled insulin dependent diabetes, she was only 2 sessions short of graduatingdue to uncontrolled insulin dependent diabetes, she was only 2 sessions short of graduating  Smoking History:  History  Smoking Status  . Current Every Day Smoker  . Packs/day: 0.50  . Years: 44.00  . Types: Cigarettes  Smokeless Tobacco  . Never Used    Comment: down to 4 cigs per day plus 5 ecigs with nicotien daily//3.14.17    Diagnosis:  Other emphysema ([place])  ADL [facility]:   Initial Exercise Prescription:   Discharge Exercise Prescription (Final Exercise Prescription Changes):     Exercise Prescription Changes - 07/04/15 1500      Response to Exercise   Blood Pressure (Admit) 104/60   Heart Rate (Admit) 99 bpm   Heart Rate (Exercise) 103 bpm   Oxygen Saturation (Admit) 83 %  increased to 88 with PLB   Oxygen Saturation (Exercise) 85 %   Rating of Perceived Exertion (Exercise) 11   Perceived Dyspnea (Exercise) 1     Resistance Training   Training Prescription Yes   Weight orange bands   Reps 10-12     Interval Training   Interval Training No     Oxygen   Oxygen Continuous   Liters 6     NuStep   Level 3   Minutes 30   METs 1.6      Functional Capacity:   Psychological, QOL, Others - Outcomes: PHQ 2/9: Depression screen [facility] 2/9 01/27/2015 07/12/2013 09/14/2012 08/26/2012  Decreased Interest 1 1 0 0  Down, Depressed, Hopeless 1 1 1 1   PHQ - 2 Score 2 2 1 1   Altered sleeping 1 3 - -  Tired, decreased energy 1 3 - -  Change in appetite 1 2 - -  Feeling bad or failure about yourself  0 2 - -  Trouble concentrating 0 1 - -  Moving slowly or fidgety/restless 0 0 - -  Suicidal thoughts 0 0 - -  PHQ-9 Score 5 13 - -  Difficult doing work/chores Not difficult at all - - -  Some recent  data might be hidden    Quality of Life:   Personal Goals: Goals established at orientation with interventions provided to work toward goal.    Personal Goals Discharge:   Nutrition & Weight - Outcomes:    Nutrition:     Nutrition Therapy & Goals - 07/07/15 0804      Nutrition Therapy   Diet Carb Modified     Personal Nutrition Goals   Personal Goal #1 Basic understanding of DM diet     Intervention Plan   Intervention Prescribe, educate and counsel regarding individualized specific dietary modifications aiming towards targeted core components such as weight, hypertension, lipid management, diabetes, heart failure and other comorbidities.;Nutrition handout(s) given to patient.  Pt given handouts for DM diet and 1500 kcal, 5 day menu ideas   Expected Outcomes Short Term Goal: Understand basic principles of dietary content, such as calories, fat, sodium, cholesterol and nutrients.;Long Term Goal: Adherence to prescribed nutrition plan.      Nutrition Discharge:   Education Questionnaire Score:   Goals reviewed with patient; copy given to patient.

## 2015-09-07 NOTE — Addendum Note (Signed)
Encounter addended by: Lance Morin, RN on: 09/07/2015  8:25 AM<BR>    Actions taken: Visit diagnoses modified, Sign clinical note

## 2015-09-07 NOTE — Progress Notes (Signed)
Pulmonary Rehab Discharge Note  [name] has been discharged from pulmonary rehab as of July 04, 2015.  [name] completed 22 exercise sessions, but they were very sporatic due to multiple health issues.  [name] was being ruled out for sarcoidosis and/or lymphoma, [name] had multiple emphysema exacerbations, and due to being on high doses of steroids [name] developed insulin dependent diabetes.  [name] did not make much progress in regards to her strength and stamina.  This is the second time [name] has been through the program and continues to smoke cigarettes and does not try to exercise independently.

## 2015-09-12 ENCOUNTER — Ambulatory Visit: Payer: Medicare Other | Admitting: Internal Medicine

## 2015-10-10 ENCOUNTER — Telehealth: Payer: Self-pay | Admitting: Adult Health

## 2015-10-10 NOTE — Telephone Encounter (Signed)
Spoke with pt and she states that she has been taking 5mg  oral prednisone since 09/22/15. She c/o increased fatigue, cough with white mucus production, ShOB even with minimal exertion. Pt has used albuterol HFA and nebulizer but states that symptom relief only lasts about an hour. Pt also reports that neb meds make her "jittery". Pt is concerned that tapering down on the oral pred has caused her to have a COPD flare.   TP - Please advise. Thanks!

## 2015-10-10 NOTE — Telephone Encounter (Signed)
Can increase prednisone 10mg  daily , hold at this dose  Needs ov follow up with Dr. [name]  If not improving will need ov sooner Please contact office for sooner follow up if symptoms do not improve or worsen or seek emergency care

## 2015-10-10 NOTE — Telephone Encounter (Signed)
Pt returning call she can be reached @ [phone].[name]

## 2015-10-10 NOTE — Telephone Encounter (Signed)
Spoke with pt and gave recommendations. She will start increased dose of oral prednisone today. She is aware to contact office if not improving.

## 2015-10-10 NOTE — Telephone Encounter (Signed)
lmtcb X1 for pt to relay results/recs.  

## 2015-10-17 ENCOUNTER — Ambulatory Visit: Payer: Medicare Other | Admitting: Internal Medicine

## 2015-10-23 ENCOUNTER — Ambulatory Visit (INDEPENDENT_AMBULATORY_CARE_PROVIDER_SITE_OTHER): Payer: Medicare Other | Admitting: Internal Medicine

## 2015-10-23 ENCOUNTER — Encounter: Payer: Self-pay | Admitting: Internal Medicine

## 2015-10-23 VITALS — BP 102/60 | HR 83 | Ht 64.0 in | Wt 219.6 lb

## 2015-10-23 DIAGNOSIS — R590 Localized enlarged lymph nodes: Secondary | ICD-10-CM

## 2015-10-23 DIAGNOSIS — Z720 Tobacco use: Secondary | ICD-10-CM

## 2015-10-23 DIAGNOSIS — J441 Chronic obstructive pulmonary disease with (acute) exacerbation: Secondary | ICD-10-CM | POA: Diagnosis not present

## 2015-10-23 DIAGNOSIS — J9611 Chronic respiratory failure with hypoxia: Secondary | ICD-10-CM

## 2015-10-23 DIAGNOSIS — R5383 Other fatigue: Secondary | ICD-10-CM

## 2015-10-23 MED ORDER — PREDNISONE 10 MG PO TABS: 10.0000 mg | ORAL_TABLET | Freq: Every day | ORAL | 0 refills | Status: DC

## 2015-10-23 MED ORDER — DOXYCYCLINE HYCLATE 100 MG PO TABS: 100.0000 mg | ORAL_TABLET | Freq: Two times a day (BID) | ORAL | 0 refills | Status: DC

## 2015-10-23 NOTE — Assessment & Plan Note (Signed)
Continues to smoke despite severe COPD Plan: Stop Smoking Smoking cessation discussed.  Encourages attendance at [employer] classes to assist in smoking cessation.

## 2015-10-23 NOTE — Assessment & Plan Note (Signed)
COPD Flare Plan: Please try to quit smoking completely. This is the single most powerful action you can take to improve your lung function. Labs today ( CBC ) Prednisone taper; 10 mg tablets: 4 tabs x 2 days, 3 tabs x 2 days, 2 tabs x 2 days  Then resume 10 mg daily until we see you again. Doxycycline 100 mg twice daily x 7 days Continue your Spiriva and Symbicort daily. Continue your Flonase 2 puffs each nare once daily. Continue Albuterol inhaler/ nebulizer treatments  Continue wearing your oxygen at 3 L [place] as you have been doing. CT chest with contrast in Dec. 2017 Follow up in 2 weeks with Dr. [name] or NP. Please contact office for sooner follow up if symptoms do not improve or worsen or seek emergency care

## 2015-10-23 NOTE — Assessment & Plan Note (Signed)
Worsening dyspnea with COPD flare Continued tobacco abuse. Plan: Stop Smoking Continue wearing your oxygen at 3 L [place] at rest and 4L with activity. Prednisone Taper Follow UP with Dr. [name] or NP in 2 weeks Please contact office for sooner follow up if symptoms do not improve or worsen or seek emergency care

## 2015-10-23 NOTE — Patient Instructions (Addendum)
It is nice to meet you today. Please try to quit smoking completely. This is the single most powerful action you can take to improve your lung function. Labs today ( CBC ) Prednisone taper; 10 mg tablets: 4 tabs x 2 days, 3 tabs x 2 days, 2 tabs x 2 days  Then resume 10 mg daily until we see you again. Doxycycline 100 mg twice daily x 7 days Continue your Spiriva and Symbicort daily. Continue your Flonase 2 puffs each nare once daily. Continue Albuterol inhaler/ nebulizer treatments  Continue wearing your oxygen at 3 L [place] as you have been doing. CT chest with contrast in Dec. 2017 Follow up in 2 weeks with Dr. [name] or NP. Please contact office for sooner follow up if symptoms do not improve or worsen or seek emergency care

## 2015-10-23 NOTE — Addendum Note (Signed)
Addended by: Collier Salina on: 10/23/2015 05:31 PM   Modules accepted: Orders

## 2015-10-23 NOTE — Progress Notes (Signed)
History of Present Illness [name] is a 70 y.o. female current smoker with oxygen dependent COPD , and mediastinal lymphadenopathy followed by Dr. Chase [name].   10/23/2015 2 Month Follow Up Appointment:  Pt. Presents to the office after call to the office on 10/10/15 for fatigue, cough, and dyspnea on exertion. At the time she was taking prednisone 5 mg daily, and using her Albuterol HFA and nebs without improvement. Toni Edison, NP instructed the patient  by phone to increase her prednisone to 10 mg daily until she could be seen by Dr. Chase [name] today. The patient presents to the office today with continued cough and dyspnea.. She states her secretions are beige and thick. She is continuing to smoke. She is smoking about 3 cigarettes a day. The exam room smells strongly of smoke.She denies fever, chest pain, orthopnea or hemoptysis.She states that she and Dr. Chase [name] were trying to wean her from her high dose of prednisone. She had been weaned to 5 mg daily 09/22/2015 and has been short of breath since.She does feel batter on the 10 mg than she did on the 5 mg dose. She is wearing her oxygen at 3 L [name]. She states she is compliant with her Spiriva, Singulair, Flonase and Albuterol inhaler. She has not been using her nebs.  Tests 10/23/2015>> CBC >> pending  Past medical hx Past Medical History:  Diagnosis Date  . Arrhythmia   . Bronchiectasis march 2012   On CT chest. RUL. Mild  . CHF (congestive heart failure) ([place])   . Chicken pox   . Chronic diastolic heart failure ([place])   . Chronic respiratory failure ([place])    Followed in Pulmonary clinic/ [facility]/ Ramaswamy  - 06/30/2012 desat to 86%  RA walking 50 ft, recovered to 90% at rest - 06/30/2012  Walked 1lpm x 3 laps @ 185 ft each stopped due to  Sob, no desat  rec 02 2lpm with activity and sleeping, ok at rest   . COPD (chronic obstructive pulmonary disease) ([place])     FEV-1 in 2008 was 63% with a diffusion capacity of 33%.   .  Generalized anxiety disorder   . History of cervical cancer 1982  . Hyperlipidemia   . Hypertension   . Leg swelling    Venous doppler right 07/21/12 >>Neg    . 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 Medical non-compliance   . MITRAL VALVE PROLAPSE   . Obesity, unspecified   . Pulmonary nodule march 2012   3mm RUL and RLL 1st seen march 2012 CT, ?progression 12/2012 CT  . Seasonal allergies   . Tobacco abuse     Smokes one pack a day since age 43.  . Tobacco abuse      Past surgical hx, Family hx, Social hx all reviewed.  Current Outpatient Prescriptions on File Prior to Visit  Medication Sig  . albuterol (PROVENTIL) (2.5 MG/3ML) 0.083% nebulizer solution USE 3 ML'S IN NEBULIZER EVERY 6 HOURS AS NEEDED FOR WHEEZING  . albuterol (VENTOLIN HFA) 108 (90 Base) MCG/ACT inhaler INHALE 2 PUFFS INTO THE LUNGS EVERY 6 HOURS AS NEEDED  . ALPRAZolam (XANAX) 0.5 MG tablet TAKE 1 TABLET BY MOUTH TWICE A DAY AS NEEDED FOR ANXIETY  . budesonide-formoterol (SYMBICORT) 160-4.5 MCG/ACT inhaler Inhale 2 puffs into the lungs 2 (two) times daily.  . cloNIDine (CATAPRES) 0.1 MG tablet Take 1 tablet (0.1 mg total) by mouth 2 (two) times daily.  [place] Kitchen  clotrimazole (MYCELEX) 10 MG troche Take 1 tablet (10 mg total) by mouth 5 (five) times daily.  . famotidine (PEPCID) 20 MG tablet Take 1 tablet (20 mg total) by mouth at bedtime.  . fluticasone (FLONASE) 50 MCG/ACT nasal spray Place 2 sprays into both nostrils daily as needed for allergies or rhinitis.  Marland Kitchen GLUCOCOM LANCETS 33G MISC Use with Test Strips to take blood sugars  . glucose blood (BAYER CONTOUR NEXT TEST) test strip Use to take blood sugars twice daily.  . Insulin Glargine (LANTUS SOLOSTAR) 100 UNIT/ML Solostar Pen Inject 32 Units into the skin daily at 10 pm. (Patient taking differently: Inject 35 Units into the skin daily at 10 pm. )  . Insulin Pen Needle (PEN NEEDLES) 32G X 4 MM MISC 15 Units by Other route at  bedtime. -- use with insulin  . losartan (COZAAR) 25 MG tablet Take 1 tablet (25 mg total) by mouth daily.  . metoprolol tartrate (LOPRESSOR) 25 MG tablet Take 0.5 tablets (12.5 mg total) by mouth 2 (two) times daily.  Mar[place] Kitchen omeprazole (PRILOSEC) 20 MG capsule Take 1 capsule (20 mg total) by mouth daily.  . predniSONE (DELTASONE) 10 MG tablet 3 tabs daily for 2 weeks,  then 2 tabs daily -hold at this dose. (Patient taking differently: 15 mg daily. Currently taking 15 mg orally per day.)  . simvastatin (ZOCOR) 10 MG tablet Take 2 tablets (20 mg total) by mouth daily at 6 PM.  . tiotropium (SPIRIVA HANDIHALER) 18 MCG inhalation capsule PLACE 1 CAPSULE INTO HANDIHALER AND INHALE EVERY DAY   No current facility-administered medications on file prior to visit.      Allergies  Allergen Reactions  . Ambien [Zolpidem Tartrate] Other (See Comments)    Causes nightmares    Review Of Systems:  Constitutional:   No  weight loss, night sweats,  Fevers, chills, fatigue, or  lassitude.  HEENT:   No headaches,  Difficulty swallowing,  Tooth/dental problems, or  Sore throat,                No sneezing, itching, ear ache, nasal congestion, post nasal drip,   CV:  No chest pain,  Orthopnea, PND, swelling in lower extremities, anasarca, dizziness, palpitations, syncope.   GI  No heartburn, indigestion, abdominal pain, nausea, vomiting, diarrhea, change in bowel habits, loss of appetite, bloody stools.   Resp: + shortness of breath with exertion and  at rest.  + excess mucus, + productive cough,  No non-productive cough,  No coughing up of blood.  No change in color of mucus.  No wheezing.  No chest wall deformity  Skin: no rash or lesions.  GU: no dysuria, change in color of urine, no urgency or frequency.  No flank pain, no hematuria   MS:  No joint pain or swelling.  No decreased range of motion.  No back pain.  Psych:  No change in mood or affect. No depression or anxiety.  No memory loss.   Vital  Signs BP 102/60 (BP Location: Left Arm, Cuff Size: Normal)   Pulse 83   Ht 5\' 4"  (1.626 m)   Wt 219 lb 9.6 oz (99.6 kg)   SpO2 91%   BMI 37.69 kg/m    Physical Exam:  General- No distress,  A&Ox3, obese, wearing oxygen ENT: No sinus tenderness, TM clear, pale nasal mucosa, no oral exudate,no post nasal drip, no LAN Cardiac: S1, S2, regular rate and rhythm, no murmur Chest: No wheeze/ rales/ diminished per bases  bilaterally,; no accessory muscle use, no nasal flaring, no sternal retractions Abd.: Soft Non-tender, obese Ext: No clubbing cyanosis, trace edema Neuro:  normal strength Skin: No rashes, warm and dry Psych: normal mood and behavior   Assessment/Plan  COPD (chronic obstructive pulmonary disease) COPD Flare Plan: Please try to quit smoking completely. This is the single most powerful action you can take to improve your lung function. Labs today ( CBC ) Prednisone taper; 10 mg tablets: 4 tabs x 2 days, 3 tabs x 2 days, 2 tabs x 2 days  Then resume 10 mg daily until we see you again. Doxycycline 100 mg twice daily x 7 days Continue your Spiriva and Symbicort daily. Continue your Flonase 2 puffs each nare once daily. Continue Albuterol inhaler/ nebulizer treatments  Continue wearing your oxygen at 3 L [place] as you have been doing. CT chest with contrast in Dec. 2017 Follow up in 2 weeks with Dr. Chase [name] or NP. Please contact office for sooner follow up if symptoms do not improve or worsen or seek emergency care    Chronic respiratory failure ([place]) Worsening dyspnea with COPD flare Continued tobacco abuse. Plan: Stop Smoking Continue wearing your oxygen at 3 L  at rest and 4L with activity. Prednisone Taper Follow UP with Dr. Chase [name] or NP in 2 weeks Please contact office for sooner follow up if symptoms do not improve or worsen or seek emergency care    Tobacco abuse Continues to smoke despite severe COPD Plan: Stop Smoking Smoking cessation discussed.    Encourages attendance at [employer] classes to assist in smoking cessation.    Magdalen Spatz, NP 10/23/2015  2:40 PM    STAFF NOTE: I, Dr Ann Lions have personally reviewed patient's available data, including medical history, events of note, physical examination and test results as part of my evaluation. I have discussed with resident/NP and other care providers such as pharmacist, RN and RRT.  In addition,  I personally evaluated patient and elicited key findings of   S: Follow-up for COPD, mediastinal adenopathy tconsidered high risk for biopsy on empiric steroid therapy and continued smoking. At last visit we started tapering her prednisone down because CT chest in July 2017 showed improvement in the mediastinal adenopathy. However she is unable to go below 5 mg per day. At this point in time in the last week or 2 she's having symptoms of COPD exacerbation  O: Obese female sitting comfortably. Room smells of tobacco oxygen on no wheeze  A:  COPD with likely exacerbation Mediastinal adenopathy improving in July 2017 needs repeat CT chest Active smoking finds it very difficult to quit  P:  Treat COPD exacerbation with doxycycline and prednisone burst. Taper prednisone down to 10 mg and at this point we will see her and see if we can taper prednisone down any further Needs follow-up CT chest December 2017 or so for mediastinal adenopathy Advised to quit smoking    .  Rest per NP/medical resident whose note is outlined above and that I agree with     Dr. Brand Males, M.D., [facility].C.P Pulmonary and Critical Care Medicine Staff Physician [facility] Pager: [phone], If no answer or between  15:00h - 7:00h: call [phone]  10/23/2015 2:50 PM

## 2015-10-31 ENCOUNTER — Ambulatory Visit (INDEPENDENT_AMBULATORY_CARE_PROVIDER_SITE_OTHER): Payer: Medicare Other | Admitting: Internal Medicine

## 2015-10-31 ENCOUNTER — Other Ambulatory Visit: Payer: Medicare Other

## 2015-10-31 ENCOUNTER — Encounter: Payer: Self-pay | Admitting: Internal Medicine

## 2015-10-31 ENCOUNTER — Other Ambulatory Visit (INDEPENDENT_AMBULATORY_CARE_PROVIDER_SITE_OTHER): Payer: Medicare Other

## 2015-10-31 VITALS — BP 110/62 | HR 90 | Temp 98.2°F | Resp 22 | Wt 221.0 lb

## 2015-10-31 DIAGNOSIS — Z23 Encounter for immunization: Secondary | ICD-10-CM

## 2015-10-31 DIAGNOSIS — R5383 Other fatigue: Secondary | ICD-10-CM | POA: Diagnosis not present

## 2015-10-31 DIAGNOSIS — I10 Essential (primary) hypertension: Secondary | ICD-10-CM

## 2015-10-31 DIAGNOSIS — Z8639 Personal history of other endocrine, nutritional and metabolic disease: Secondary | ICD-10-CM

## 2015-10-31 DIAGNOSIS — R590 Localized enlarged lymph nodes: Secondary | ICD-10-CM | POA: Diagnosis not present

## 2015-10-31 DIAGNOSIS — Z794 Long term (current) use of insulin: Secondary | ICD-10-CM

## 2015-10-31 DIAGNOSIS — K219 Gastro-esophageal reflux disease without esophagitis: Secondary | ICD-10-CM

## 2015-10-31 DIAGNOSIS — E119 Type 2 diabetes mellitus without complications: Secondary | ICD-10-CM

## 2015-10-31 LAB — BASIC METABOLIC PANEL
BUN: 15 mg/dL (ref 6–23)
CALCIUM: 9.4 mg/dL (ref 8.4–10.5)
CO2: 33 meq/L — AB (ref 19–32)
CREATININE: 0.94 mg/dL (ref 0.40–1.20)
Chloride: 105 mEq/L (ref 96–112)
GFR: 76.04 mL/min (ref >60.00)
GLUCOSE: 118 mg/dL — AB (ref 70–99)
Potassium: 4.2 mEq/L (ref 3.5–5.1)
Sodium: 142 mEq/L (ref 135–145)

## 2015-10-31 LAB — CBC WITH DIFFERENTIAL/PLATELET
BASOS PCT: 0.1 % (ref 0.0–3.0)
BASOS PCT: 0.3 % (ref 0.0–3.0)
Basophils Absolute: 0 10*3/uL (ref 0.0–0.1)
Basophils Absolute: 0 10*3/uL (ref 0.0–0.1)
EOS ABS: 0 10*3/uL (ref 0.0–0.7)
EOS PCT: 0.3 % (ref 0.0–5.0)
EOS PCT: 0.4 % (ref 0.0–5.0)
Eosinophils Absolute: 0 10*3/uL (ref 0.0–0.7)
HCT: 43.5 % (ref 36.0–46.0)
HCT: 43.7 % (ref 36.0–46.0)
HEMOGLOBIN: 13.7 g/dL (ref 12.0–15.0)
Hemoglobin: 13.7 g/dL (ref 12.0–15.0)
LYMPHS ABS: 1.3 10*3/uL (ref 0.7–4.0)
LYMPHS ABS: 1.3 10*3/uL (ref 0.7–4.0)
Lymphocytes Relative: 13.6 % (ref 12.0–46.0)
Lymphocytes Relative: 13.9 % (ref 12.0–46.0)
MCHC: 31.4 g/dL (ref 30.0–36.0)
MCHC: 31.6 g/dL (ref 30.0–36.0)
MCV: 83.1 fl (ref 78.0–100.0)
MCV: 83.5 fl (ref 78.0–100.0)
MONO ABS: 0.2 10*3/uL (ref 0.1–1.0)
MONOS PCT: 1.5 % — AB (ref 3.0–12.0)
Monocytes Absolute: 0.1 10*3/uL (ref 0.1–1.0)
Monocytes Relative: 1.9 % — ABNORMAL LOW (ref 3.0–12.0)
NEUTROS PCT: 83.9 % — AB (ref 43.0–77.0)
NEUTROS PCT: 84.1 % — AB (ref 43.0–77.0)
Neutro Abs: 8.1 10*3/uL — ABNORMAL HIGH (ref 1.4–7.7)
Neutro Abs: 8.3 10*3/uL — ABNORMAL HIGH (ref 1.4–7.7)
Platelets: 323 10*3/uL (ref 150.0–400.0)
Platelets: 327 10*3/uL (ref 150.0–400.0)
RBC: 5.23 Mil/uL — AB (ref 3.87–5.11)
RBC: 5.23 Mil/uL — AB (ref 3.87–5.11)
RDW: 16.9 % — AB (ref 11.5–15.5)
RDW: 17.5 % — ABNORMAL HIGH (ref 11.5–15.5)
WBC: 9.6 10*3/uL (ref 4.0–10.5)
WBC: 9.9 10*3/uL (ref 4.0–10.5)

## 2015-10-31 LAB — COMPREHENSIVE METABOLIC PANEL
ALT: 26 U/L (ref 0–35)
AST: 25 U/L (ref 0–37)
Albumin: 3.6 g/dL (ref 3.5–5.2)
Alkaline Phosphatase: 64 U/L (ref 39–117)
BUN: 15 mg/dL (ref 6–23)
CHLORIDE: 105 meq/L (ref 96–112)
CO2: 31 meq/L (ref 19–32)
Calcium: 9.5 mg/dL (ref 8.4–10.5)
Creatinine, Ser: 0.91 mg/dL (ref 0.40–1.20)
GFR: 78.94 mL/min (ref >60.00)
GLUCOSE: 119 mg/dL — AB (ref 70–99)
POTASSIUM: 4.2 meq/L (ref 3.5–5.1)
SODIUM: 142 meq/L (ref 135–145)
Total Bilirubin: 0.3 mg/dL (ref 0.2–1.2)
Total Protein: 6.7 g/dL (ref 6.0–8.3)

## 2015-10-31 LAB — TSH: TSH: 1.4 u[IU]/mL (ref 0.35–4.50)

## 2015-10-31 LAB — HEMOGLOBIN A1C: Hgb A1c MFr Bld: 7.7 % — ABNORMAL HIGH (ref 4.6–6.5)

## 2015-10-31 NOTE — Progress Notes (Signed)
Subjective:    Patient ID: [name], female    DOB: August 14, 1945, 70 y.o.   MRN: [id number]  HPI She is here for follow-up.  COPD, mediastinal lymphadenopathy: She is following closely with pulmonary. She saw him recently and her present dose was increased from 5 mg 10 mg due to cough, fatigue and shortness of breath. She is wearing her oxygen continuously. She is using all of her inhalers as prescribed.  Diabetes: She is taking her medication daily as prescribed. She is still on prednisone. She knows to be off later this month or at least at a lower level. She is compliant with a diabetic diet. She is not exercising regularly. She monitors her sugars and they have been running 100's.   Hypertension: She is taking her medication daily. She is compliant with a low sodium diet.  She denies chest pain, palpitations, edema, shortness of breath and regular headaches. She is exercising regularly.  She does not monitor her blood pressure at home.    GERD:  She is taking her medication daily as prescribed.  She denies any GERD symptoms and feels her GERD is well controlled.   Fatigue: She still complains of fatigue and wonders about her B12 level.  Medications and allergies reviewed with patient and updated if appropriate.  Patient Active Problem List   Diagnosis Date Noted  . Oral candidiasis 06/02/2015  . 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 Diabetes ([place]) 01/23/2015  . Fatigue 01/20/2015  . COLD (chronic obstructive lung disease) ([place]) 10/18/2014  . Generalized anxiety disorder 03/15/2013  . Obesity, unspecified 03/15/2013  . Chronic diastolic heart failure ([place]) 03/14/2013  . Chronic respiratory failure ([place]) 06/30/2012  . COPD (chronic obstructive pulmonary disease) ([place])   . Tobacco abuse   . Pulmonary nodule 03/22/2010  . 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 Bronchiectasis 03/22/2010  . Hyperlipidemia 06/13/2008  . Essential hypertension 03/06/2007  . ADJUSTMENT DISORDER WITH  ANXIETY 02/20/2007  . MITRAL VALVE PROLAPSE 02/20/2007  . CERVICAL CANCER, HX OF 02/20/2007    Current Outpatient Prescriptions on File Prior to Visit  Medication Sig Dispense Refill  . albuterol (PROVENTIL) (2.5 MG/3ML) 0.083% nebulizer solution USE 3 ML'S IN NEBULIZER EVERY 6 HOURS AS NEEDED FOR WHEEZING 100 vial 2  . albuterol (VENTOLIN HFA) 108 (90 Base) MCG/ACT inhaler INHALE 2 PUFFS INTO THE LUNGS EVERY 6 HOURS AS NEEDED 3 Inhaler 1  . ALPRAZolam (XANAX) 0.5 MG tablet TAKE 1 TABLET BY MOUTH TWICE A DAY AS NEEDED FOR ANXIETY 60 tablet 2  . budesonide-formoterol (SYMBICORT) 160-4.5 MCG/ACT inhaler Inhale 2 puffs into the lungs 2 (two) times daily. 3 Inhaler 1  . cloNIDine (CATAPRES) 0.1 MG tablet Take 1 tablet (0.1 mg total) by mouth 2 (two) times daily. 180 tablet 3  . clotrimazole (MYCELEX) 10 MG troche Take 1 tablet (10 mg total) by mouth 5 (five) times daily. 35 tablet 0  . famotidine (PEPCID) 20 MG tablet Take 1 tablet (20 mg total) by mouth at bedtime. 90 tablet 3  . fluticasone (FLONASE) 50 MCG/ACT nasal spray Place 2 sprays into both nostrils daily as needed for allergies or rhinitis. 16 g 11  . GLUCOCOM LANCETS 33G MISC Use with Test Strips to take blood sugars 100 each 5  . glucose blood (BAYER CONTOUR NEXT TEST) test strip Use to take blood sugars twice daily. 100 each 3  . Insulin Glargine (LANTUS SOLOSTAR) 100 UNIT/ML Solostar Pen Inject 32 Units into the skin daily at 10 pm. (Patient taking  differently: Inject 35 Units into the skin daily at 10 pm. ) 5 pen 11  . Insulin Pen Needle (PEN NEEDLES) 32G X 4 MM MISC 15 Units by Other route at bedtime. -- use with insulin 30 each 1  . losartan (COZAAR) 25 MG tablet Take 1 tablet (25 mg total) by mouth daily. 90 tablet 3  . metoprolol tartrate (LOPRESSOR) 25 MG tablet Take 0.5 tablets (12.5 mg total) by mouth 2 (two) times daily. 90 tablet 3  . omeprazole (PRILOSEC) 20 MG capsule Take 1 capsule (20 mg total) by mouth daily. 90 capsule  3  . predniSONE (DELTASONE) 10 MG tablet 3 tabs daily for 2 weeks,  then 2 tabs daily -hold at this dose. (Patient taking differently: 15 mg daily. Currently taking 15 mg orally per day.) 75 tablet 1  . predniSONE (DELTASONE) 10 MG tablet Take 1 tablet (10 mg total) by mouth daily with breakfast. 20 tablet 0  . simvastatin (ZOCOR) 10 MG tablet Take 2 tablets (20 mg total) by mouth daily at 6 PM. 180 tablet 1  . tiotropium (SPIRIVA HANDIHALER) 18 MCG inhalation capsule PLACE 1 CAPSULE INTO HANDIHALER AND INHALE EVERY DAY 90 capsule 3   No current facility-administered medications on file prior to visit.     Past Medical History:  Diagnosis Date  . Arrhythmia   . Bronchiectasis march 2012   On CT chest. RUL. Mild  . CHF (congestive heart failure) ([place])   . Chicken pox   . Chronic diastolic heart failure ([place])   . Chronic respiratory failure ([place])    Followed in Pulmonary clinic/ [facility]/ Ramaswamy  - 06/30/2012 desat to 86%  RA walking 50 ft, recovered to 90% at rest - 06/30/2012  Walked 1lpm x 3 laps @ 185 ft each stopped due to  Sob, no desat  rec 02 2lpm with activity and sleeping, ok at rest   . COPD (chronic obstructive pulmonary disease) ([place])     FEV-1 in 2008 was 63% with a diffusion capacity of 33%.   . Generalized anxiety disorder   . History of cervical cancer 1982  . Hyperlipidemia   . Hypertension   . Leg swelling    Venous doppler right 07/21/12 >>Neg    . 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 Medical non-compliance   . MITRAL VALVE PROLAPSE   . Obesity, unspecified   . Pulmonary nodule march 2012   34mm RUL and RLL 1st seen march 2012 CT, ?progression 12/2012 CT  . Seasonal allergies   . Tobacco abuse     Smokes one pack a day since age 28.  . Tobacco abuse     Past Surgical History:  Procedure Laterality Date  . TOTAL ABDOMINAL HYSTERECTOMY      Social History   Social History  . Marital status: Divorced     Spouse name: N/A  . Number of children: N/A  . Years of education: N/A   Occupational History  . works at a call center    Social History Main Topics  . Smoking status: Current Every Day Smoker    Packs/day: 0.50    Years: 44.00    Types: Cigarettes  . Smokeless tobacco: Never Used     Comment: down to 4 cigs per day plus 5 ecigs with nicotien daily//3.14.17  . Alcohol use 0.0 oz/week     Comment: occasional  . Drug use: No  . Sexual activity: Not  Asked   Other Topics Concern  . None   Social History Narrative   Married but recently separated from husband    Family History  Problem Relation Age of Onset  . Other Father     MVA  . Esophageal cancer Mother     Review of Systems  Constitutional: Negative for fever.  Respiratory: Positive for cough, shortness of breath and wheezing.   Cardiovascular: Negative for chest pain, palpitations and leg swelling.  Gastrointestinal: Negative for abdominal pain.       No gerd  Neurological: Negative for light-headedness and headaches.       Objective:   Vitals:   10/31/15 1546  BP: 110/62  Pulse: 90  Resp: (!) 22  Temp: 98.2 F (36.8 C)   Filed Weights   10/31/15 1546  Weight: 221 lb (100.2 kg)   Body mass index is 37.93 kg/m.   Physical Exam Constitutional: Appears well-developed and well-nourished. No distress.  HENT:  Head: Normocephalic and atraumatic.  Neck: Neck supple. No tracheal deviation present. No thyromegaly present.  No cervical lymphadenopathy Cardiovascular: Decreased heart sounds. Normal rate, regular rhythm and normal heart sounds.    No carotid bruit .  No edema Pulmonary/Chest: Effort normal and breath sounds normal. No respiratory distress. Diffusely decreased breath sounds-chronic. No has no wheezes. No rales.  Skin: Skin is warm and dry. Not diaphoretic.  Psychiatric: Normal mood and affect. Behavior is normal.         Assessment & Plan:   See Problem List for Assessment and Plan  of chronic medical problems.

## 2015-10-31 NOTE — Progress Notes (Signed)
Pre visit review using our clinic review tool, if applicable. No additional management support is needed unless otherwise documented below in the visit note. 

## 2015-10-31 NOTE — Patient Instructions (Addendum)
  Test(s) ordered today. Your results will be released to [place] (or called to you) after review, usually within 72hours after test completion. If any changes need to be made, you will be notified at that same time.   Flu vaccine administered today.   Medications reviewed and updated.  No changes recommended at this time.   Please followup in 6 months

## 2015-11-01 DIAGNOSIS — K219 Gastro-esophageal reflux disease without esophagitis: Secondary | ICD-10-CM | POA: Insufficient documentation

## 2015-11-01 LAB — VITAMIN B12: VITAMIN B 12: 327 pg/mL (ref 211–911)

## 2015-11-01 NOTE — Assessment & Plan Note (Signed)
GERD controlled Continue daily medication  

## 2015-11-01 NOTE — Assessment & Plan Note (Addendum)
Likely multifactorial, which we discussed Check B12 level, which she believes has been low in the past, TSH, CBC, CMP Metoprolol may be contributing-can consider discontinuing the metoprolol and increasing the losartan

## 2015-11-01 NOTE — Assessment & Plan Note (Signed)
Sugars well controlled at home Remains on steroids-discussed that we will need to adjust her medication depending on her steroid dose She will continue to monitor closely Check A1c today Continue current dose of insulin

## 2015-11-01 NOTE — Assessment & Plan Note (Signed)
BP well controlled Current regimen effective and well tolerated Continue current medications at current doses cmp  

## 2015-11-03 ENCOUNTER — Other Ambulatory Visit: Payer: Self-pay | Admitting: Internal Medicine

## 2015-11-03 ENCOUNTER — Other Ambulatory Visit: Payer: Self-pay | Admitting: Emergency Medicine

## 2015-11-06 ENCOUNTER — Encounter: Payer: Self-pay | Admitting: Internal Medicine

## 2015-11-06 ENCOUNTER — Other Ambulatory Visit: Payer: Self-pay | Admitting: Internal Medicine

## 2015-11-06 MED ORDER — BUDESONIDE-FORMOTEROL FUMARATE 160-4.5 MCG/ACT IN AERO: 2.0000 | INHALATION_SPRAY | Freq: Two times a day (BID) | RESPIRATORY_TRACT | 2 refills | Status: DC

## 2015-11-13 ENCOUNTER — Telehealth: Payer: Self-pay

## 2015-11-13 ENCOUNTER — Ambulatory Visit (INDEPENDENT_AMBULATORY_CARE_PROVIDER_SITE_OTHER): Payer: Medicare Other

## 2015-11-13 ENCOUNTER — Encounter: Payer: Self-pay | Admitting: Internal Medicine

## 2015-11-13 ENCOUNTER — Ambulatory Visit (INDEPENDENT_AMBULATORY_CARE_PROVIDER_SITE_OTHER): Payer: Medicare Other | Admitting: Internal Medicine

## 2015-11-13 VITALS — BP 102/54 | HR 82 | Ht 64.0 in | Wt 219.0 lb

## 2015-11-13 DIAGNOSIS — E538 Deficiency of other specified B group vitamins: Secondary | ICD-10-CM | POA: Diagnosis not present

## 2015-11-13 DIAGNOSIS — J9611 Chronic respiratory failure with hypoxia: Secondary | ICD-10-CM

## 2015-11-13 DIAGNOSIS — R590 Localized enlarged lymph nodes: Secondary | ICD-10-CM

## 2015-11-13 DIAGNOSIS — E119 Type 2 diabetes mellitus without complications: Secondary | ICD-10-CM

## 2015-11-13 DIAGNOSIS — R5381 Other malaise: Secondary | ICD-10-CM

## 2015-11-13 DIAGNOSIS — Z794 Long term (current) use of insulin: Secondary | ICD-10-CM

## 2015-11-13 DIAGNOSIS — J441 Chronic obstructive pulmonary disease with (acute) exacerbation: Secondary | ICD-10-CM

## 2015-11-13 MED ORDER — CYANOCOBALAMIN 1000 MCG/ML IJ SOLN
1000.0000 ug | Freq: Once | INTRAMUSCULAR | Status: AC
  Administered 2015-11-13: 1000 ug via INTRAMUSCULAR

## 2015-11-13 NOTE — Progress Notes (Signed)
Injection given.   Stacy J Burns, MD  

## 2015-11-13 NOTE — Telephone Encounter (Signed)
Patient came in today for first b12 injection---she will let you know how this makes her feel and then schedule for more accordingly (per your office note)---but also, patient is wondering if she can have referral sent to get assistance at home with ADL's about 3x weekly---routing to dr burns, please advise, thanks

## 2015-11-13 NOTE — Patient Instructions (Signed)
Reduce Prednisone to 5mg  daily from your current dose of 10mg  daily. Return in December after your CT scan.

## 2015-11-14 ENCOUNTER — Encounter: Payer: Self-pay | Admitting: Internal Medicine

## 2015-11-14 NOTE — Telephone Encounter (Signed)
Is she eligible for anything?

## 2015-11-15 DIAGNOSIS — R5381 Other malaise: Secondary | ICD-10-CM | POA: Insufficient documentation

## 2015-11-15 NOTE — Telephone Encounter (Signed)
Spoke with pt, she states she would like help with bathing and getting dressed daily. Stanton Kidney, [facility], states that insurance will not cover this.   Pt would like to know if we can put in a referral for in home PT. Please advise.

## 2015-11-15 NOTE — Telephone Encounter (Signed)
Referral ordered

## 2015-11-15 NOTE — Addendum Note (Signed)
Addended by: Binnie Rail on: 11/15/2015 12:40 PM   Modules accepted: Orders

## 2015-11-17 ENCOUNTER — Telehealth: Payer: Self-pay | Admitting: Internal Medicine

## 2015-11-17 NOTE — Telephone Encounter (Signed)
Double it back up until better then try 10 alternating with 5 mg daily  - if not better on 10 p 5 days needs to check in again or see someone in office

## 2015-11-17 NOTE — Telephone Encounter (Signed)
Spoke with pt. She saw MR on 11/13/15. He decreased her prednisone dose to 5mg  daily from 10mg  daily. Since doing this she is having more SOB. Denies chest tightness, wheezing or coughing. Would like recommendations.  MW - please advise as MR is not available. Thanks.

## 2015-11-17 NOTE — Telephone Encounter (Signed)
Spoke with pt. She is aware of MW's recommendations. Nothing further was needed. 

## 2015-11-19 ENCOUNTER — Other Ambulatory Visit: Payer: Self-pay | Admitting: Internal Medicine

## 2015-11-20 ENCOUNTER — Telehealth: Payer: Self-pay | Admitting: Internal Medicine

## 2015-11-20 MED ORDER — PREDNISONE 10 MG PO TABS: ORAL_TABLET | ORAL | 0 refills | Status: DC

## 2015-11-20 NOTE — Telephone Encounter (Signed)
Please have her take pred taper > Take 40mg  daily for 3 days, then 30mg  daily for 3 days, then 20mg  daily for 3 days, then 10mg  daily for 3 days, then go to 10mg  daily until she is seen by [place] and we decide how to decrease from there. Needs OV in about 2 weeks

## 2015-11-20 NOTE — Telephone Encounter (Signed)
Spoke with the pt and notified of recs per RB  She verbalized understanding  Rx was sent  Nothing further needed

## 2015-11-20 NOTE — Telephone Encounter (Signed)
Rx faxed to [facility]

## 2015-11-20 NOTE — Telephone Encounter (Signed)
Spoke with pt who states she had increased sob with exertion, prod cough with clear mucus & occ chills. Pt denies any fever or sweats. Per 11-17-15 phone not pt is to alternate 5 mg & 10 mg daily. Pt states she isn't having any improvement with this.  RB please advise. Thanks.

## 2015-11-21 DIAGNOSIS — J441 Chronic obstructive pulmonary disease with (acute) exacerbation: Secondary | ICD-10-CM | POA: Diagnosis not present

## 2015-11-21 DIAGNOSIS — Z9981 Dependence on supplemental oxygen: Secondary | ICD-10-CM | POA: Diagnosis not present

## 2015-11-21 DIAGNOSIS — Z720 Tobacco use: Secondary | ICD-10-CM | POA: Diagnosis not present

## 2015-11-21 DIAGNOSIS — E669 Obesity, unspecified: Secondary | ICD-10-CM | POA: Diagnosis not present

## 2015-11-21 DIAGNOSIS — E119 Type 2 diabetes mellitus without complications: Secondary | ICD-10-CM | POA: Diagnosis not present

## 2015-11-21 DIAGNOSIS — I110 Hypertensive heart disease with heart failure: Secondary | ICD-10-CM | POA: Diagnosis not present

## 2015-11-21 DIAGNOSIS — K219 Gastro-esophageal reflux disease without esophagitis: Secondary | ICD-10-CM | POA: Diagnosis not present

## 2015-11-21 DIAGNOSIS — Z794 Long term (current) use of insulin: Secondary | ICD-10-CM | POA: Diagnosis not present

## 2015-11-21 DIAGNOSIS — I5032 Chronic diastolic (congestive) heart failure: Secondary | ICD-10-CM | POA: Diagnosis not present

## 2015-11-21 DIAGNOSIS — E785 Hyperlipidemia, unspecified: Secondary | ICD-10-CM | POA: Diagnosis not present

## 2015-11-21 DIAGNOSIS — Z8542 Personal history of malignant neoplasm of other parts of uterus: Secondary | ICD-10-CM | POA: Diagnosis not present

## 2015-11-21 DIAGNOSIS — F419 Anxiety disorder, unspecified: Secondary | ICD-10-CM | POA: Diagnosis not present

## 2015-11-22 ENCOUNTER — Telehealth: Payer: Self-pay | Admitting: Emergency Medicine

## 2015-11-22 NOTE — Telephone Encounter (Signed)
ok 

## 2015-11-22 NOTE — Telephone Encounter (Signed)
Received call from [facility], requesting verbal orders for PT, Home Health Aid, and Social work consult.   Please advise.    CB number [phone]

## 2015-11-23 ENCOUNTER — Encounter: Payer: Self-pay | Admitting: Internal Medicine

## 2015-11-23 DIAGNOSIS — I110 Hypertensive heart disease with heart failure: Secondary | ICD-10-CM | POA: Diagnosis not present

## 2015-11-23 DIAGNOSIS — F419 Anxiety disorder, unspecified: Secondary | ICD-10-CM | POA: Diagnosis not present

## 2015-11-23 DIAGNOSIS — K219 Gastro-esophageal reflux disease without esophagitis: Secondary | ICD-10-CM | POA: Diagnosis not present

## 2015-11-23 DIAGNOSIS — E119 Type 2 diabetes mellitus without complications: Secondary | ICD-10-CM | POA: Diagnosis not present

## 2015-11-23 DIAGNOSIS — I5032 Chronic diastolic (congestive) heart failure: Secondary | ICD-10-CM | POA: Diagnosis not present

## 2015-11-23 DIAGNOSIS — J441 Chronic obstructive pulmonary disease with (acute) exacerbation: Secondary | ICD-10-CM | POA: Diagnosis not present

## 2015-11-23 NOTE — Telephone Encounter (Signed)
Verbal authorization given to [facility]

## 2015-11-27 ENCOUNTER — Other Ambulatory Visit: Payer: Self-pay | Admitting: Internal Medicine

## 2015-11-27 DIAGNOSIS — J441 Chronic obstructive pulmonary disease with (acute) exacerbation: Secondary | ICD-10-CM | POA: Diagnosis not present

## 2015-11-27 DIAGNOSIS — I5032 Chronic diastolic (congestive) heart failure: Secondary | ICD-10-CM | POA: Diagnosis not present

## 2015-11-27 DIAGNOSIS — F419 Anxiety disorder, unspecified: Secondary | ICD-10-CM | POA: Diagnosis not present

## 2015-11-27 DIAGNOSIS — I110 Hypertensive heart disease with heart failure: Secondary | ICD-10-CM | POA: Diagnosis not present

## 2015-11-27 DIAGNOSIS — E119 Type 2 diabetes mellitus without complications: Secondary | ICD-10-CM | POA: Diagnosis not present

## 2015-11-27 DIAGNOSIS — J449 Chronic obstructive pulmonary disease, unspecified: Secondary | ICD-10-CM

## 2015-11-27 DIAGNOSIS — K219 Gastro-esophageal reflux disease without esophagitis: Secondary | ICD-10-CM | POA: Diagnosis not present

## 2015-11-30 ENCOUNTER — Telehealth: Payer: Self-pay | Admitting: Internal Medicine

## 2015-11-30 DIAGNOSIS — F419 Anxiety disorder, unspecified: Secondary | ICD-10-CM | POA: Diagnosis not present

## 2015-11-30 DIAGNOSIS — I110 Hypertensive heart disease with heart failure: Secondary | ICD-10-CM | POA: Diagnosis not present

## 2015-11-30 DIAGNOSIS — K219 Gastro-esophageal reflux disease without esophagitis: Secondary | ICD-10-CM | POA: Diagnosis not present

## 2015-11-30 DIAGNOSIS — E119 Type 2 diabetes mellitus without complications: Secondary | ICD-10-CM | POA: Diagnosis not present

## 2015-11-30 DIAGNOSIS — J441 Chronic obstructive pulmonary disease with (acute) exacerbation: Secondary | ICD-10-CM | POA: Diagnosis not present

## 2015-11-30 DIAGNOSIS — I5032 Chronic diastolic (congestive) heart failure: Secondary | ICD-10-CM | POA: Diagnosis not present

## 2015-11-30 NOTE — Telephone Encounter (Signed)
Advised kim with home care

## 2015-11-30 NOTE — Telephone Encounter (Signed)
Routing to dr burns, please advise, thanks 

## 2015-11-30 NOTE — Telephone Encounter (Signed)
Maudie Mercury, [facility], request order for continue nursing visit 1 time a a week for 3 weeks and 2 PRN. Please call her back

## 2015-11-30 NOTE — Telephone Encounter (Signed)
Ok to give verbal 

## 2015-12-04 ENCOUNTER — Ambulatory Visit (INDEPENDENT_AMBULATORY_CARE_PROVIDER_SITE_OTHER): Payer: Medicare Other | Admitting: Adult Health

## 2015-12-04 ENCOUNTER — Encounter: Payer: Self-pay | Admitting: Adult Health

## 2015-12-04 DIAGNOSIS — K219 Gastro-esophageal reflux disease without esophagitis: Secondary | ICD-10-CM | POA: Diagnosis not present

## 2015-12-04 DIAGNOSIS — Z720 Tobacco use: Secondary | ICD-10-CM | POA: Diagnosis not present

## 2015-12-04 DIAGNOSIS — I5032 Chronic diastolic (congestive) heart failure: Secondary | ICD-10-CM | POA: Diagnosis not present

## 2015-12-04 DIAGNOSIS — E119 Type 2 diabetes mellitus without complications: Secondary | ICD-10-CM | POA: Diagnosis not present

## 2015-12-04 DIAGNOSIS — R590 Localized enlarged lymph nodes: Secondary | ICD-10-CM | POA: Diagnosis not present

## 2015-12-04 DIAGNOSIS — J9611 Chronic respiratory failure with hypoxia: Secondary | ICD-10-CM | POA: Diagnosis not present

## 2015-12-04 DIAGNOSIS — J441 Chronic obstructive pulmonary disease with (acute) exacerbation: Secondary | ICD-10-CM | POA: Diagnosis not present

## 2015-12-04 DIAGNOSIS — F419 Anxiety disorder, unspecified: Secondary | ICD-10-CM | POA: Diagnosis not present

## 2015-12-04 DIAGNOSIS — I110 Hypertensive heart disease with heart failure: Secondary | ICD-10-CM | POA: Diagnosis not present

## 2015-12-04 MED ORDER — PREDNISONE 10 MG PO TABS: ORAL_TABLET | ORAL | 5 refills | Status: DC

## 2015-12-04 NOTE — Patient Instructions (Signed)
Restart Prednisone 10mg  daily -hold at this dose until seen back .  Continue on Symbicort and Spiriva , rinse after use.  Work on not smoking  Continue on Oxygen 3l/m rest and 4l/m act  Follow up for CT chest next month as planned .  Follow up with Dr. [name] in 1 months and As needed   Please contact office for sooner follow up if symptoms do not improve or worsen or seek emergency care

## 2015-12-04 NOTE — Assessment & Plan Note (Signed)
Smoking cessation  

## 2015-12-04 NOTE — Assessment & Plan Note (Signed)
?   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  Continue on Symbicort and Spiriva , rinse after use.  Work on not smoking  Continue on Oxygen 3l/m rest and 4l/m act  Follow up for CT chest next month as planned .  Follow up with Dr. [name] in 1 months and As needed   Please contact office for sooner follow up if symptoms do not improve or worsen or seek emergency care

## 2015-12-04 NOTE — Assessment & Plan Note (Signed)
O2 -walk test in office requires 4lm/ walking and 3l/m rest to keep sat >905.

## 2015-12-04 NOTE — Progress Notes (Signed)
Subjective:    Patient ID: [name], female    DOB: 05-11-1945, 70 y.o.   MRN: [id number]  HPI 70 yo female with COPD ,O2 dependent,   TEST  FEV-1 in 2008 was 63% with a diffusion capacity of 33% PFTs 78/14 shows gold stage 3 copd with fev1 1.12L/49%, FVC 2.01L/70% - post bd parameters. No bd response, Ratio 60/77%. TLC 87%, DLCO 5/21% Duplex LE - 07/21/12 - negative. BNP 07/16/12 - 25  2 D echo on 03/08/15 showed EF 60-65%, Grade 1 DD , PAP mod increased at 37mmHg.  OV April 2012: Obese female hospitalized in March 2012 and now following. 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 OVerall doing much better since discharge. Using o2. No insurance. Unable to afford xopenex and pulmicort neb. Able to afford atroven neb. Wanting med change. Dysponea is at baseline class 3. Reportedly cut down on smoking to 1 cig/day. Mild pedal edema improving. Admits to snoring and excess day time somnolence  FEV-1 in 2008 was 63% with a diffusion capacity of 33% 2 D echo on 03/08/15 showed EF 60-65%, Grade 1 DD , PAP mod increased at 64mmHg.  CT chest 03/21/15 showed increased mediastinal/bilateral hilar lymphadenopahty worrisome for lymphoma. Unchanged pulmonary nodules x 2 years.  Subsequent PET scan showed intensely hypermetabolic  adenopahty in chest .  Referred to [facility] 03/2015 >too sick for bx.  Walk test in office 05/12/2015 > on 3 L baseline oxygen walking 185 feet 3 laps: She only walk one lap and then she desaturated to 86%. At this point her oxygen was increased to 6 L and then she walk one lap and maintained a pulse ox of 90%. After that she was dyspneic and did not want walk further.     12/04/2015 Follow up : COPD, O2 dependent, Lung nodule  Pt returns for  2 month  follow up  COPD and thoracic lymphadenopathy.  CT chest 03/21/15 showed increased mediastinal/bilateral hilar lymphadenopahty worrisome for lymphoma. Unchanged pulmonary nodules x 2 years.  Subsequent PET scan showed intensely hypermetabolic  adenopahty in chest .  She has been started on prednisone slow taper .  Follow up CT chest 08/09/2015 showed mild to moderate mediastinal and bilateral hilar lymphadenopathy with mild decreased since February 2017. moderate to severe emphysema. She was seen by Dr. Pia Mau thoracic surgery that feels patient is too high risk for mediastinoscopy.  She was treated with steroids and  CT chest in 07/2015 showed mild decreased in adenopathy.  Currently on prednisone 10mg  , had COPD flare 2 weeks ago, tx w/ steroid burst. Unfortunately was sent in standard taper and she stopped 2 days ago. She is suppose to be on prednisone 10mg  daily. We discussed starting back on prednisone 10mg  daily . Is feeling better. This morning was having PT , noted her O2 sats to drop to 69% on 4l/m . Today in office on arrival 93% on 3l/m . Walk test in office with no desats on 4l/m O2. Sat 90% walking. Had to change to new tubing and tank. It appeared her tank was not turned all the way on. We recheck O2 several times and O2 sats was 90% on 4l/m with walk test.  Prevnar and PVX and flu vaccines are utd. .  Remains on On Symbicort and Spiriva .  Denies  chest pain, orthopnea , increased edema , calf pain, hemoptysis .  Has upcoming CT chest in next month.  She is still smoking , smells very strong to cigs smoke. Discussed smoking cessation .    Past Medical History:  Diagnosis Date  . Arrhythmia   . Bronchiectasis march 2012   On CT chest. RUL. Mild  . CHF (congestive heart failure) ([place])   . Chicken pox   . Chronic diastolic heart failure ([place])   . Chronic respiratory failure ([place])    Followed in Pulmonary clinic/ [facility]/ Ramaswamy  - 06/30/2012 desat to 86%  RA walking 50  ft, recovered to 90% at rest - 06/30/2012  Walked 1lpm x 3 laps @ 185 ft each stopped due to  Sob, no desat  rec 02 2lpm with activity and sleeping, ok at rest   . COPD (chronic obstructive pulmonary disease) ([place])     FEV-1 in 2008 was 63% with a diffusion capacity of 33%.   . Generalized anxiety disorder   . History of cervical cancer 1982  . Hyperlipidemia   . Hypertension   . Leg swelling    Venous doppler right 07/21/12 >>Neg    . 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 Medical non-compliance   . MITRAL VALVE PROLAPSE   . Obesity, unspecified   . Pulmonary nodule march 2012   6mm RUL and RLL 1st seen march 2012 CT, ?progression 12/2012 CT  . Seasonal allergies   . Tobacco abuse     Smokes one pack a day since age 70.  . Tobacco abuse    Current Outpatient Prescriptions on File Prior to Visit  Medication Sig Dispense Refill  . albuterol (PROVENTIL) (2.5 MG/3ML) 0.083% nebulizer solution USE 3 ML'S IN NEBULIZER EVERY 6 HOURS AS NEEDED FOR WHEEZING 100 vial 2  . albuterol (VENTOLIN HFA) 108 (90 Base) MCG/ACT inhaler INHALE 2 PUFFS INTO THE LUNGS EVERY 6 HOURS AS NEEDED 3 Inhaler 1  . ALPRAZolam (XANAX) 0.5 MG tablet TAKE 1 TABLET BY MOUTH TWICE A DAY AS NEEDED FOR ANXIETY 60 tablet 2  . budesonide-formoterol (SYMBICORT) 160-4.5 MCG/ACT inhaler Inhale 2 puffs into the lungs 2 (two) times daily. 3 Inhaler 2  . cloNIDine (CATAPRES) 0.1 MG tablet Take 1 tablet (0.1 mg total) by mouth 2 (two) times daily. 180 tablet 3  . famotidine (PEPCID) 20 MG tablet Take 1 tablet (20 mg total) by mouth at bedtime. 90 tablet 3  . fluticasone (FLONASE) 50 MCG/ACT nasal spray Place 2 sprays into both nostrils daily as needed for allergies or rhinitis. 16 g 11  . GLUCOCOM LANCETS 33G MISC Use with Test Strips to take blood sugars 100 each 5  . glucose blood (BAYER CONTOUR NEXT TEST) test strip Use to take blood sugars twice daily. 100 each 3  . Insulin Glargine (LANTUS  SOLOSTAR) 100 UNIT/ML Solostar Pen Inject 32 Units into the skin daily at 10 pm. (Patient taking differently: Inject 35 Units into the skin daily at 10 pm. ) 5 pen 11  . Insulin Pen Needle (PEN NEEDLES) 32G X 4 MM MISC 15 Units by Other route at bedtime. -- use with insulin 30 each 1  . losartan (COZAAR) 25 MG tablet Take 1 tablet (25 mg total) by mouth daily. 90 tablet 3  . metoprolol tartrate (LOPRESSOR) 25 MG tablet Take 0.5 tablets (12.5 mg total) by mouth 2 (two) times daily. 90 tablet 3  . omeprazole (  PRILOSEC) 20 MG capsule Take 1 capsule (20 mg total) by mouth daily. 90 capsule 3  . simvastatin (ZOCOR) 10 MG tablet Take 2 tablets (20 mg total) by mouth daily at 6 PM. 180 tablet 1  . predniSONE (DELTASONE) 10 MG tablet TAKE 1 TABLET BY MOUTH EVERY DAY WITH BREAKFAST (Patient not taking: Reported on 12/04/2015) 20 tablet 0  . predniSONE (DELTASONE) 10 MG tablet 4 x 3 days, 3 x 3 days, 2 x 3 days, 1 x 3 days, then stop (Patient not taking: Reported on 12/04/2015) 30 tablet 0  . tiotropium (SPIRIVA HANDIHALER) 18 MCG inhalation capsule PLACE 1 CAPSULE INTO HANDIHALER AND INHALE EVERY DAY 90 capsule 3   No current facility-administered medications on file prior to visit.      Review of Systems Constitutional:   No  weight loss, night sweats,  Fevers, chills,  +fatigue, or  lassitude.  HEENT:   No headaches,  Difficulty swallowing,  Tooth/dental problems, or  Sore throat,                No sneezing, itching, ear ache, nasal congestion, post nasal drip,   CV:  No chest pain,  Orthopnea, PND, swelling in lower extremities, anasarca, dizziness, palpitations, syncope.   GI  No heartburn, indigestion, abdominal pain, nausea, vomiting, diarrhea, change in bowel habits, loss of appetite, bloody stools.   Resp:   No chest wall deformity  Skin: no rash or lesions.  GU: no dysuria, change in color of urine, no urgency or frequency.  No flank pain, no hematuria   MS:  No joint pain or swelling.   No decreased range of motion.  No back pain.  Psych:  No change in mood or affect. No depression or anxiety.  No memory loss.         Objective:   Physical Exam  Vitals:   12/04/15 1513  BP: 108/60  Pulse: 99  Temp: 97.6 F (36.4 C)  SpO2: 93%  Weight: 220 lb 9.6 oz (100.1 kg)  Height: 5\' 4"  (1.626 m)    Body mass index is 37.87 kg/m.    GEN: A/Ox3; pleasant , NAD, obese in wc on O2    HEENT:  [place]/AT,  EACs-clear, TMs-wnl, NOSE-clear, THROAT-clear, no lesions, no postnasal drip or exudate noted.   NECK:  Supple w/ fair ROM; no JVD; normal carotid impulses w/o bruits; no thyromegaly or nodules palpated; no lymphadenopathy.    RESP  Decreased BS in bases  no accessory muscle use, no dullness to percussion  CARD:  RRR, no m/r/g  , tr peripheral edema, pulses intact, no cyanosis or clubbing.  GI:   Soft & nt; nml bowel sounds; no organomegaly or masses detected.   Musco: Warm bil, no deformities or joint swelling noted.   Neuro: alert, no focal deficits noted.    Skin: Warm, no lesions or rashes  Amiel Sharrow NP-C  [facility]  12/04/2015

## 2015-12-04 NOTE — Assessment & Plan Note (Signed)
Recent flare in active smoker . Improved with steroid burst.   Plan  Patient Instructions  Restart Prednisone 10mg  daily -hold at this dose until seen back .  Continue on Symbicort and Spiriva , rinse after use.  Work on not smoking  Continue on Oxygen 3l/m rest and 4l/m act  Follow up for CT chest next month as planned .  Follow up with Dr. [name] in 1 months and As needed   Please contact office for sooner follow up if symptoms do not improve or worsen or seek emergency care

## 2015-12-05 DIAGNOSIS — E119 Type 2 diabetes mellitus without complications: Secondary | ICD-10-CM | POA: Diagnosis not present

## 2015-12-05 DIAGNOSIS — I5032 Chronic diastolic (congestive) heart failure: Secondary | ICD-10-CM | POA: Diagnosis not present

## 2015-12-05 DIAGNOSIS — K219 Gastro-esophageal reflux disease without esophagitis: Secondary | ICD-10-CM | POA: Diagnosis not present

## 2015-12-05 DIAGNOSIS — F419 Anxiety disorder, unspecified: Secondary | ICD-10-CM | POA: Diagnosis not present

## 2015-12-05 DIAGNOSIS — J441 Chronic obstructive pulmonary disease with (acute) exacerbation: Secondary | ICD-10-CM | POA: Diagnosis not present

## 2015-12-05 DIAGNOSIS — I110 Hypertensive heart disease with heart failure: Secondary | ICD-10-CM | POA: Diagnosis not present

## 2015-12-06 DIAGNOSIS — F419 Anxiety disorder, unspecified: Secondary | ICD-10-CM | POA: Diagnosis not present

## 2015-12-06 DIAGNOSIS — J441 Chronic obstructive pulmonary disease with (acute) exacerbation: Secondary | ICD-10-CM | POA: Diagnosis not present

## 2015-12-06 DIAGNOSIS — K219 Gastro-esophageal reflux disease without esophagitis: Secondary | ICD-10-CM | POA: Diagnosis not present

## 2015-12-06 DIAGNOSIS — E119 Type 2 diabetes mellitus without complications: Secondary | ICD-10-CM | POA: Diagnosis not present

## 2015-12-06 DIAGNOSIS — I5032 Chronic diastolic (congestive) heart failure: Secondary | ICD-10-CM | POA: Diagnosis not present

## 2015-12-06 DIAGNOSIS — I110 Hypertensive heart disease with heart failure: Secondary | ICD-10-CM | POA: Diagnosis not present

## 2015-12-07 DIAGNOSIS — I5032 Chronic diastolic (congestive) heart failure: Secondary | ICD-10-CM | POA: Diagnosis not present

## 2015-12-07 DIAGNOSIS — E119 Type 2 diabetes mellitus without complications: Secondary | ICD-10-CM | POA: Diagnosis not present

## 2015-12-07 DIAGNOSIS — F419 Anxiety disorder, unspecified: Secondary | ICD-10-CM | POA: Diagnosis not present

## 2015-12-07 DIAGNOSIS — I110 Hypertensive heart disease with heart failure: Secondary | ICD-10-CM | POA: Diagnosis not present

## 2015-12-07 DIAGNOSIS — K219 Gastro-esophageal reflux disease without esophagitis: Secondary | ICD-10-CM | POA: Diagnosis not present

## 2015-12-07 DIAGNOSIS — J441 Chronic obstructive pulmonary disease with (acute) exacerbation: Secondary | ICD-10-CM | POA: Diagnosis not present

## 2015-12-08 DIAGNOSIS — F419 Anxiety disorder, unspecified: Secondary | ICD-10-CM | POA: Diagnosis not present

## 2015-12-08 DIAGNOSIS — E119 Type 2 diabetes mellitus without complications: Secondary | ICD-10-CM | POA: Diagnosis not present

## 2015-12-08 DIAGNOSIS — I5032 Chronic diastolic (congestive) heart failure: Secondary | ICD-10-CM | POA: Diagnosis not present

## 2015-12-08 DIAGNOSIS — J441 Chronic obstructive pulmonary disease with (acute) exacerbation: Secondary | ICD-10-CM | POA: Diagnosis not present

## 2015-12-08 DIAGNOSIS — I110 Hypertensive heart disease with heart failure: Secondary | ICD-10-CM | POA: Diagnosis not present

## 2015-12-08 DIAGNOSIS — K219 Gastro-esophageal reflux disease without esophagitis: Secondary | ICD-10-CM | POA: Diagnosis not present

## 2015-12-11 DIAGNOSIS — I5032 Chronic diastolic (congestive) heart failure: Secondary | ICD-10-CM | POA: Diagnosis not present

## 2015-12-11 DIAGNOSIS — E119 Type 2 diabetes mellitus without complications: Secondary | ICD-10-CM | POA: Diagnosis not present

## 2015-12-11 DIAGNOSIS — J441 Chronic obstructive pulmonary disease with (acute) exacerbation: Secondary | ICD-10-CM | POA: Diagnosis not present

## 2015-12-11 DIAGNOSIS — I110 Hypertensive heart disease with heart failure: Secondary | ICD-10-CM | POA: Diagnosis not present

## 2015-12-11 DIAGNOSIS — F419 Anxiety disorder, unspecified: Secondary | ICD-10-CM | POA: Diagnosis not present

## 2015-12-11 DIAGNOSIS — K219 Gastro-esophageal reflux disease without esophagitis: Secondary | ICD-10-CM | POA: Diagnosis not present

## 2015-12-12 DIAGNOSIS — I110 Hypertensive heart disease with heart failure: Secondary | ICD-10-CM | POA: Diagnosis not present

## 2015-12-12 DIAGNOSIS — J441 Chronic obstructive pulmonary disease with (acute) exacerbation: Secondary | ICD-10-CM | POA: Diagnosis not present

## 2015-12-12 DIAGNOSIS — I5032 Chronic diastolic (congestive) heart failure: Secondary | ICD-10-CM | POA: Diagnosis not present

## 2015-12-12 DIAGNOSIS — E119 Type 2 diabetes mellitus without complications: Secondary | ICD-10-CM | POA: Diagnosis not present

## 2015-12-12 DIAGNOSIS — F419 Anxiety disorder, unspecified: Secondary | ICD-10-CM | POA: Diagnosis not present

## 2015-12-12 DIAGNOSIS — K219 Gastro-esophageal reflux disease without esophagitis: Secondary | ICD-10-CM | POA: Diagnosis not present

## 2015-12-13 DIAGNOSIS — J441 Chronic obstructive pulmonary disease with (acute) exacerbation: Secondary | ICD-10-CM | POA: Diagnosis not present

## 2015-12-13 DIAGNOSIS — E119 Type 2 diabetes mellitus without complications: Secondary | ICD-10-CM | POA: Diagnosis not present

## 2015-12-13 DIAGNOSIS — I110 Hypertensive heart disease with heart failure: Secondary | ICD-10-CM | POA: Diagnosis not present

## 2015-12-13 DIAGNOSIS — K219 Gastro-esophageal reflux disease without esophagitis: Secondary | ICD-10-CM | POA: Diagnosis not present

## 2015-12-13 DIAGNOSIS — I5032 Chronic diastolic (congestive) heart failure: Secondary | ICD-10-CM | POA: Diagnosis not present

## 2015-12-13 DIAGNOSIS — F419 Anxiety disorder, unspecified: Secondary | ICD-10-CM | POA: Diagnosis not present

## 2015-12-18 ENCOUNTER — Telehealth: Payer: Self-pay | Admitting: Internal Medicine

## 2015-12-18 NOTE — Telephone Encounter (Signed)
Pt calling back to check status of request.[name]'

## 2015-12-18 NOTE — Telephone Encounter (Signed)
Pt aware that we will call her as soon as we hear from Dr [name].  Will send again.

## 2015-12-18 NOTE — Telephone Encounter (Signed)
Increase prednisone back to 20mg  per day

## 2015-12-18 NOTE — Telephone Encounter (Signed)
Pt aware of rec's.  Pt to call by the end of the week for an update if symptoms worsen.  Nothing further needed.

## 2015-12-18 NOTE — Telephone Encounter (Signed)
Spoke with pt. States that her breathing is worsening since decreasing her Prednisone. Currently taking Prednisone 10mg  daily. Reports increased SOB, cough, chest congestion and wheezing. Cough is non productive Denies chest tightness. Would like MR's recommendations.  MR - please advise. Thanks.

## 2015-12-19 ENCOUNTER — Encounter: Payer: Self-pay | Admitting: Internal Medicine

## 2015-12-19 ENCOUNTER — Ambulatory Visit: Payer: Medicare Other

## 2015-12-20 ENCOUNTER — Telehealth: Payer: Self-pay | Admitting: Internal Medicine

## 2015-12-20 DIAGNOSIS — K219 Gastro-esophageal reflux disease without esophagitis: Secondary | ICD-10-CM | POA: Diagnosis not present

## 2015-12-20 DIAGNOSIS — I110 Hypertensive heart disease with heart failure: Secondary | ICD-10-CM | POA: Diagnosis not present

## 2015-12-20 DIAGNOSIS — J441 Chronic obstructive pulmonary disease with (acute) exacerbation: Secondary | ICD-10-CM | POA: Diagnosis not present

## 2015-12-20 DIAGNOSIS — E119 Type 2 diabetes mellitus without complications: Secondary | ICD-10-CM | POA: Diagnosis not present

## 2015-12-20 DIAGNOSIS — I5032 Chronic diastolic (congestive) heart failure: Secondary | ICD-10-CM | POA: Diagnosis not present

## 2015-12-20 DIAGNOSIS — F419 Anxiety disorder, unspecified: Secondary | ICD-10-CM | POA: Diagnosis not present

## 2015-12-20 MED ORDER — CEPHALEXIN 500 MG PO CAPS
500.0000 mg | ORAL_CAPSULE | Freq: Three times a day (TID) | ORAL | 0 refills | Status: DC
Start: 2015-12-20 — End: 2016-01-08

## 2015-12-20 MED ORDER — PREDNISONE 10 MG PO TABS: ORAL_TABLET | ORAL | 0 refills | Status: DC

## 2015-12-20 NOTE — Telephone Encounter (Signed)
Spoke with pt, aware of recs.  rx's called to preferred pharmacy.  Nothing further needed.

## 2015-12-20 NOTE — Telephone Encounter (Signed)
Spoke with [facility] health nurse, states that pt has low 02 sats- staying 87% on 4lpm, also c/o increased SOB, chest congestion Xfew days.  Belenda Cruise is requesting further recs for pt.  *please note that there is also a patient email regarding this same issue.  MR please advise on recs.  Thanks!

## 2015-12-20 NOTE — Telephone Encounter (Signed)
Per pt's email: I still cannotcough up the congestion (only a little). I think I need antibiotics.I upped the PREDNISONE TO 20 MG.Thesecoughingattacks aremakingSOB worse. thank you  --------------------------------------------------------------------------   Pt called into office on 11.27.17 with an increase in S/S and was advised by MR to increase pred to 20mg  daily. Pt is now sending an email requesting abx.   MR please advise. Thanks.

## 2015-12-20 NOTE — Telephone Encounter (Signed)
  Do cephalexin 500mg  tid x 5 days for bronchtiis  And  Please take Take prednisone 40mg  once daily x 4 days, then 30mg  once daily x 4 days, then 20mg  once daily to continue  If worse anytime go to ER  Dr. Brand Males, M.D., [facility].C.P Pulmonary and Critical Care Medicine Staff Physician [place] [facility] Pager: [phone], If no answer or between  15:00h - 7:00h: call [phone]  12/20/2015 5:08 PM     Allergies  Allergen Reactions  . Ambien [Zolpidem Tartrate] Other (See Comments)    Causes nightmares

## 2015-12-21 ENCOUNTER — Ambulatory Visit: Payer: Medicare Other

## 2015-12-21 DIAGNOSIS — I5032 Chronic diastolic (congestive) heart failure: Secondary | ICD-10-CM | POA: Diagnosis not present

## 2015-12-21 DIAGNOSIS — I110 Hypertensive heart disease with heart failure: Secondary | ICD-10-CM | POA: Diagnosis not present

## 2015-12-21 DIAGNOSIS — K219 Gastro-esophageal reflux disease without esophagitis: Secondary | ICD-10-CM | POA: Diagnosis not present

## 2015-12-21 DIAGNOSIS — E119 Type 2 diabetes mellitus without complications: Secondary | ICD-10-CM | POA: Diagnosis not present

## 2015-12-21 DIAGNOSIS — F419 Anxiety disorder, unspecified: Secondary | ICD-10-CM | POA: Diagnosis not present

## 2015-12-21 DIAGNOSIS — J441 Chronic obstructive pulmonary disease with (acute) exacerbation: Secondary | ICD-10-CM | POA: Diagnosis not present

## 2015-12-22 DIAGNOSIS — E119 Type 2 diabetes mellitus without complications: Secondary | ICD-10-CM | POA: Diagnosis not present

## 2015-12-22 DIAGNOSIS — I5032 Chronic diastolic (congestive) heart failure: Secondary | ICD-10-CM | POA: Diagnosis not present

## 2015-12-22 DIAGNOSIS — F419 Anxiety disorder, unspecified: Secondary | ICD-10-CM | POA: Diagnosis not present

## 2015-12-22 DIAGNOSIS — K219 Gastro-esophageal reflux disease without esophagitis: Secondary | ICD-10-CM | POA: Diagnosis not present

## 2015-12-22 DIAGNOSIS — J441 Chronic obstructive pulmonary disease with (acute) exacerbation: Secondary | ICD-10-CM | POA: Diagnosis not present

## 2015-12-22 DIAGNOSIS — I110 Hypertensive heart disease with heart failure: Secondary | ICD-10-CM | POA: Diagnosis not present

## 2015-12-23 ENCOUNTER — Other Ambulatory Visit: Payer: Self-pay | Admitting: Internal Medicine

## 2015-12-25 ENCOUNTER — Inpatient Hospital Stay: Admission: RE | Admit: 2015-12-25 | Payer: Medicare Other | Source: Ambulatory Visit

## 2015-12-25 DIAGNOSIS — I5032 Chronic diastolic (congestive) heart failure: Secondary | ICD-10-CM | POA: Diagnosis not present

## 2015-12-25 DIAGNOSIS — J441 Chronic obstructive pulmonary disease with (acute) exacerbation: Secondary | ICD-10-CM | POA: Diagnosis not present

## 2015-12-25 DIAGNOSIS — K219 Gastro-esophageal reflux disease without esophagitis: Secondary | ICD-10-CM | POA: Diagnosis not present

## 2015-12-25 DIAGNOSIS — E119 Type 2 diabetes mellitus without complications: Secondary | ICD-10-CM | POA: Diagnosis not present

## 2015-12-25 DIAGNOSIS — I110 Hypertensive heart disease with heart failure: Secondary | ICD-10-CM | POA: Diagnosis not present

## 2015-12-25 DIAGNOSIS — F419 Anxiety disorder, unspecified: Secondary | ICD-10-CM | POA: Diagnosis not present

## 2015-12-26 ENCOUNTER — Encounter: Payer: Self-pay | Admitting: Internal Medicine

## 2015-12-27 ENCOUNTER — Encounter: Payer: Self-pay | Admitting: Internal Medicine

## 2015-12-27 DIAGNOSIS — I5032 Chronic diastolic (congestive) heart failure: Secondary | ICD-10-CM | POA: Diagnosis not present

## 2015-12-27 DIAGNOSIS — I110 Hypertensive heart disease with heart failure: Secondary | ICD-10-CM | POA: Diagnosis not present

## 2015-12-27 DIAGNOSIS — J441 Chronic obstructive pulmonary disease with (acute) exacerbation: Secondary | ICD-10-CM | POA: Diagnosis not present

## 2015-12-27 DIAGNOSIS — F419 Anxiety disorder, unspecified: Secondary | ICD-10-CM | POA: Diagnosis not present

## 2015-12-27 DIAGNOSIS — E119 Type 2 diabetes mellitus without complications: Secondary | ICD-10-CM | POA: Diagnosis not present

## 2015-12-27 DIAGNOSIS — K219 Gastro-esophageal reflux disease without esophagitis: Secondary | ICD-10-CM | POA: Diagnosis not present

## 2015-12-27 MED ORDER — PREDNISONE 20 MG PO TABS: 20.0000 mg | ORAL_TABLET | Freq: Every day | ORAL | 1 refills | Status: DC

## 2015-12-28 NOTE — Progress Notes (Signed)
Subjective:     Patient ID: [name], female   DOB: 1945-12-30, 70 y.o.   MRN: [id number]  HPI 70 yo female with COPD ,O2 dependent,   TEST  FEV-1 in 2008 was 63% with a diffusion capacity of 33% PFTs 78/14 shows gold stage 3 copd with fev1 1.12L/49%, FVC 2.01L/70% - post bd parameters. No bd response, Ratio 60/77%. TLC 87%, DLCO 5/21% Duplex LE - 07/21/12 - negative. BNP 07/16/12 - 25  2 D echo on 03/08/15 showed EF 60-65%, Grade 1 DD , PAP mod increased at 41mmHg.  OV April 2012: Obese female hospitalized in March 2012 and now following. 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 OVerall doing much better since discharge. Using o2. No insurance. Unable to afford xopenex and pulmicort neb. Able to afford atroven neb. Wanting med change. Dysponea is at baseline class 3. Reportedly cut down on smoking to 1 cig/day. Mild pedal edema improving. Admits to snoring and excess day time somnolence  FEV-1 in 2008 was 63% with a diffusion capacity of 33% 2 D echo on 03/08/15 showed EF 60-65%, Grade 1 DD , PAP mod increased at 28mmHg.  CT chest 03/21/15 showed increased mediastinal/bilateral hilar lymphadenopahty worrisome for lymphoma. Unchanged pulmonary nodules x 2 years.  Subsequent PET scan showed intensely hypermetabolic  adenopahty in chest .  Referred to [facility] 03/2015 >too sick for bx.  Walk test in office 05/12/2015 > on 3 L baseline oxygen walking 185 feet 3 laps: She only walk one lap and then she desaturated to 86%. At this point her oxygen was increased to 6 L and then she walk one lap and maintained a pulse ox of 90%. After that she was dyspneic and did not want walk further.      08/21/2015 Follow up : COPD, O2 dependent, Lung nodule  Pt returns for  2 month follow  up  COPD and thoracic lymphadenopathy.  . , CT chest 03/21/15 showed increased mediastinal/bilateral hilar lymphadenopahty worrisome for lymphoma. Unchanged pulmonary nodules x 2 years.  Subsequent PET scan showed intensely hypermetabolic  adenopahty in chest .  She was referred to [facility] , but was too sick w/ COPD exacerbation to proceed  She has been started on higher prednisone taper at prednisone 40mg  daily w/ slow taper.  She returns today feeling better. O2 sats are improved some.  Follow up CT chest 08/09/2015 showed mild to moderate mediastinal and bilateral hilar lymphadenopathy with mild decreased since February 2017. moderate to severe emphysema. She was recently seen by Dr. Pia Mau thoracic surgery that feels patient is too high risk for mediastinoscopy.   Going to pulmonary rehab. On hold right now bc of DM.  Currently on prednisone 15mg  daily (decreased 3 weeks) .  Prevnar and PVX and flu vaccines are utd. .  Remains on On Symbicort and Spiriva .  On oxygen 3 l/m rest and 4l/m act .  Denies chest pain, orthopnea , increased edema , calf pain, hemoptysis .     10/23/2015 2 Month Follow Up Appointment:  Pt. Presents to the office after call to the office on 10/10/15 for fatigue, cough, and dyspnea on exertion. At the time she was taking prednisone 5 mg daily, and using her Albuterol HFA and nebs without improvement. [name], NP instructed the patient  by phone to  increase her prednisone to 10 mg daily until she could be seen by Dr. [name] today. The patient presents to the office today with continued cough and dyspnea.. She states her secretions are beige and thick. She is continuing to smoke. She is smoking about 3 cigarettes a day. The exam room smells strongly of smoke.She denies fever, chest pain, orthopnea or hemoptysis.She states that she and Dr. [name] were trying to wean her from her high dose of prednisone. She had been weaned to 5 mg daily 09/22/2015 and has been short  of breath since.She does feel batter on the 10 mg than she did on the 5 mg dose. She is wearing her oxygen at 3 L [place]. She states she is compliant with her Spiriva, Singulair, Flonase and Albuterol inhaler. She has not been using her nebs.  Tests 10/23/2015>> CBC >> pending   OV  Chief Complaint  Patient presents with  . Follow-up    Pt states her breathing has improved since last OV. Pt states she has an occassional prod cough with light brown mucus. Pt denies CP/tightness and f/c/s.    Feeling better. Thinks she can reduce perdnisone. LAst CT July 2017. Here with her friend  Review of Systems     Objective:   Physical Exam  Constitutional: She is oriented to person, place, and time. She appears well-developed and well-nourished. No distress.  obese  HENT:  Head: Normocephalic and atraumatic.  Right Ear: External ear normal.  Left Ear: External ear normal.  Mouth/Throat: Oropharynx is clear and moist. No oropharyngeal exudate.  Eyes: Conjunctivae and EOM are normal. Pupils are equal, round, and reactive to light. Right eye exhibits no discharge. Left eye exhibits no discharge. No scleral icterus.  Neck: Normal range of motion. Neck supple. No JVD present. No tracheal deviation present. No thyromegaly present.  Cardiovascular: Normal rate, regular rhythm, normal heart sounds and intact distal pulses.  Exam reveals no gallop and no friction rub.   No murmur heard. Pulmonary/Chest: Effort normal and breath sounds normal. No respiratory distress. She has no wheezes. She has no rales. She exhibits no tenderness.  Abdominal: Soft. Bowel sounds are normal. She exhibits no distension and no mass. There is no tenderness. There is no rebound and no guarding.  Musculoskeletal: Normal range of motion. She exhibits no edema or tenderness.  Lymphadenopathy:    She has no cervical adenopathy.  Neurological: She is alert and oriented to person, place, and time. She has normal reflexes. No cranial  nerve deficit. She exhibits normal muscle tone. Coordination normal.  Skin: Skin is warm and dry. No rash noted. She is not diaphoretic. No erythema. No pallor.  Psychiatric: She has a normal mood and affect. Her behavior is normal. Judgment and thought content normal.  Vitals reviewed.   Vitals:   11/13/15 1221  Weight: 219 lb (99.3 kg)  Height: 5\' 4"  (1.626 m)    Vitals:   11/13/15 1221  BP: (!) 102/54  Pulse: 82  SpO2: 90%  Weight: 219 lb (99.3 kg)  Height: 5\' 4"  (1.626 m)        Assessment:       ICD-9-CM ICD-10-CM   1. Chronic respiratory failure with hypoxia (HCC) 518.83 J96.11    799.02    2. Mediastinal adenopathy 785.6 R59.0        Plan:     Reduce Prednisone to 5mg  daily from your current dose of 10mg  daily. Return in December after your CT scan.   Dr. Belva Crome  [name], M.D., F.C.C.P Pulmonary and Critical Care Medicine Staff Physician [place] [facility] Pager: [phone], If no answer or between  15:00h - 7:00h: call [phone]  12/28/2015 5:02 PM

## 2015-12-29 DIAGNOSIS — F419 Anxiety disorder, unspecified: Secondary | ICD-10-CM | POA: Diagnosis not present

## 2015-12-29 DIAGNOSIS — E119 Type 2 diabetes mellitus without complications: Secondary | ICD-10-CM | POA: Diagnosis not present

## 2015-12-29 DIAGNOSIS — I110 Hypertensive heart disease with heart failure: Secondary | ICD-10-CM | POA: Diagnosis not present

## 2015-12-29 DIAGNOSIS — J441 Chronic obstructive pulmonary disease with (acute) exacerbation: Secondary | ICD-10-CM | POA: Diagnosis not present

## 2015-12-29 DIAGNOSIS — K219 Gastro-esophageal reflux disease without esophagitis: Secondary | ICD-10-CM | POA: Diagnosis not present

## 2015-12-29 DIAGNOSIS — I5032 Chronic diastolic (congestive) heart failure: Secondary | ICD-10-CM | POA: Diagnosis not present

## 2016-01-01 ENCOUNTER — Telehealth: Payer: Self-pay | Admitting: Emergency Medicine

## 2016-01-01 DIAGNOSIS — F419 Anxiety disorder, unspecified: Secondary | ICD-10-CM | POA: Diagnosis not present

## 2016-01-01 DIAGNOSIS — J441 Chronic obstructive pulmonary disease with (acute) exacerbation: Secondary | ICD-10-CM | POA: Diagnosis not present

## 2016-01-01 DIAGNOSIS — I110 Hypertensive heart disease with heart failure: Secondary | ICD-10-CM | POA: Diagnosis not present

## 2016-01-01 DIAGNOSIS — I5032 Chronic diastolic (congestive) heart failure: Secondary | ICD-10-CM | POA: Diagnosis not present

## 2016-01-01 DIAGNOSIS — E119 Type 2 diabetes mellitus without complications: Secondary | ICD-10-CM | POA: Diagnosis not present

## 2016-01-01 DIAGNOSIS — K219 Gastro-esophageal reflux disease without esophagitis: Secondary | ICD-10-CM | POA: Diagnosis not present

## 2016-01-01 NOTE — Telephone Encounter (Signed)
[facility] called and would like to continue care and request verbal orders. Please advise thanks.

## 2016-01-01 NOTE — Telephone Encounter (Signed)
Please advise 

## 2016-01-01 NOTE — Telephone Encounter (Signed)
LVM with Maudie Mercury giving verbal orders.

## 2016-01-01 NOTE — Telephone Encounter (Signed)
Ok to continue

## 2016-01-02 DIAGNOSIS — E119 Type 2 diabetes mellitus without complications: Secondary | ICD-10-CM | POA: Diagnosis not present

## 2016-01-02 DIAGNOSIS — F419 Anxiety disorder, unspecified: Secondary | ICD-10-CM | POA: Diagnosis not present

## 2016-01-02 DIAGNOSIS — I110 Hypertensive heart disease with heart failure: Secondary | ICD-10-CM | POA: Diagnosis not present

## 2016-01-02 DIAGNOSIS — I5032 Chronic diastolic (congestive) heart failure: Secondary | ICD-10-CM | POA: Diagnosis not present

## 2016-01-02 DIAGNOSIS — K219 Gastro-esophageal reflux disease without esophagitis: Secondary | ICD-10-CM | POA: Diagnosis not present

## 2016-01-02 DIAGNOSIS — J441 Chronic obstructive pulmonary disease with (acute) exacerbation: Secondary | ICD-10-CM | POA: Diagnosis not present

## 2016-01-04 DIAGNOSIS — K219 Gastro-esophageal reflux disease without esophagitis: Secondary | ICD-10-CM | POA: Diagnosis not present

## 2016-01-04 DIAGNOSIS — I5032 Chronic diastolic (congestive) heart failure: Secondary | ICD-10-CM | POA: Diagnosis not present

## 2016-01-04 DIAGNOSIS — J441 Chronic obstructive pulmonary disease with (acute) exacerbation: Secondary | ICD-10-CM | POA: Diagnosis not present

## 2016-01-04 DIAGNOSIS — I110 Hypertensive heart disease with heart failure: Secondary | ICD-10-CM | POA: Diagnosis not present

## 2016-01-04 DIAGNOSIS — F419 Anxiety disorder, unspecified: Secondary | ICD-10-CM | POA: Diagnosis not present

## 2016-01-04 DIAGNOSIS — E119 Type 2 diabetes mellitus without complications: Secondary | ICD-10-CM | POA: Diagnosis not present

## 2016-01-05 DIAGNOSIS — K219 Gastro-esophageal reflux disease without esophagitis: Secondary | ICD-10-CM | POA: Diagnosis not present

## 2016-01-05 DIAGNOSIS — F419 Anxiety disorder, unspecified: Secondary | ICD-10-CM | POA: Diagnosis not present

## 2016-01-05 DIAGNOSIS — J441 Chronic obstructive pulmonary disease with (acute) exacerbation: Secondary | ICD-10-CM | POA: Diagnosis not present

## 2016-01-05 DIAGNOSIS — I5032 Chronic diastolic (congestive) heart failure: Secondary | ICD-10-CM | POA: Diagnosis not present

## 2016-01-05 DIAGNOSIS — E119 Type 2 diabetes mellitus without complications: Secondary | ICD-10-CM | POA: Diagnosis not present

## 2016-01-05 DIAGNOSIS — I110 Hypertensive heart disease with heart failure: Secondary | ICD-10-CM | POA: Diagnosis not present

## 2016-01-08 ENCOUNTER — Ambulatory Visit (INDEPENDENT_AMBULATORY_CARE_PROVIDER_SITE_OTHER)
Admission: RE | Admit: 2016-01-08 | Discharge: 2016-01-08 | Disposition: A | Discharge status: Home / Self Care | Payer: Medicare Other | Source: Ambulatory Visit | Attending: Adult Health | Admitting: Adult Health

## 2016-01-08 ENCOUNTER — Ambulatory Visit (INDEPENDENT_AMBULATORY_CARE_PROVIDER_SITE_OTHER): Payer: Medicare Other | Admitting: Adult Health

## 2016-01-08 ENCOUNTER — Ambulatory Visit (INDEPENDENT_AMBULATORY_CARE_PROVIDER_SITE_OTHER): Payer: Medicare Other

## 2016-01-08 ENCOUNTER — Telehealth: Payer: Self-pay | Admitting: Adult Health

## 2016-01-08 ENCOUNTER — Encounter: Payer: Self-pay | Admitting: Adult Health

## 2016-01-08 DIAGNOSIS — R590 Localized enlarged lymph nodes: Secondary | ICD-10-CM | POA: Diagnosis not present

## 2016-01-08 DIAGNOSIS — J42 Unspecified chronic bronchitis: Secondary | ICD-10-CM

## 2016-01-08 DIAGNOSIS — E538 Deficiency of other specified B group vitamins: Secondary | ICD-10-CM

## 2016-01-08 DIAGNOSIS — J9611 Chronic respiratory failure with hypoxia: Secondary | ICD-10-CM

## 2016-01-08 DIAGNOSIS — J449 Chronic obstructive pulmonary disease, unspecified: Secondary | ICD-10-CM | POA: Diagnosis not present

## 2016-01-08 MED ORDER — LEVALBUTEROL HCL 0.63 MG/3ML IN NEBU
0.6300 mg | INHALATION_SOLUTION | Freq: Once | RESPIRATORY_TRACT | Status: AC
  Administered 2016-01-08: 0.63 mg via RESPIRATORY_TRACT

## 2016-01-08 MED ORDER — CYANOCOBALAMIN 1000 MCG/ML IJ SOLN
1000.0000 ug | Freq: Once | INTRAMUSCULAR | Status: AC
  Administered 2016-01-08: 1000 ug via INTRAMUSCULAR

## 2016-01-08 MED ORDER — AMOXICILLIN-POT CLAVULANATE 875-125 MG PO TABS: 1.0000 | ORAL_TABLET | Freq: Two times a day (BID) | ORAL | 0 refills | Status: AC

## 2016-01-08 MED ORDER — PREDNISONE 10 MG PO TABS: ORAL_TABLET | ORAL | 0 refills | Status: DC

## 2016-01-08 NOTE — Progress Notes (Signed)
Subjective:    Patient ID: [name], female    DOB: 1945/12/05, 70 y.o.   MRN: [id number]  HPI 70 yo female with COPD ,O2 dependent.   TEST  FEV-1 in 2008 was 63% with a diffusion capacity of 33% PFTs 78/14 shows gold stage 3 copd with fev1 1.12L/49%, FVC 2.01L/70% - post bd parameters. No bd response, Ratio 60/77%. TLC 87%, DLCO 5/21% Duplex LE - 07/21/12 - negative. BNP 07/16/12 - 25  2 D echo on 03/08/15 showed EF 60-65%, Grade 1 DD , PAP mod increased at 61mmHg.  OV April 2012: Obese female hospitalized in March 2012 and now following. 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 OVerall doing much better since discharge. Using o2. No insurance. Unable to afford xopenex and pulmicort neb. Able to afford atroven neb. Wanting med change. Dysponea is at baseline class 3. Reportedly cut down on smoking to 1 cig/day. Mild pedal edema improving. Admits to snoring and excess day time somnolence  FEV-1 in 2008 was 63% with a diffusion capacity of 33% 2 D echo on 03/08/15 showed EF 60-65%, Grade 1 DD , PAP mod increased at 52mmHg.  CT chest 03/21/15 showed increased mediastinal/bilateral hilar lymphadenopahty worrisome for lymphoma. Unchanged pulmonary nodules x 2 years.  Subsequent PET scan showed intensely hypermetabolic  adenopahty in chest .  Referred to [facility] 03/2015 >too sick for bx.  Walk test in office 05/12/2015 > on 3 L baseline oxygen walking 185 feet 3 laps: She only walk one lap and then she desaturated to 86%. At this point her oxygen was increased to 6 L and then she walk one lap and maintained a pulse ox of 90%. After that she was dyspneic and did not want walk further.     01/09/2016 Acute OV :  COPD flare  Pt is followed for severe COPD/Emphysema  Pt is  being treated for possible sarcoid w/ steroid responsive mediastinal/bilateral hilar lymphadenopahty  CT chest 02/2015 Mediastinal and bilateral hilar lymphadenopathy is increased since 01/11/2013. Subsequent PET scan showed intensely hypermetabolic  adenopahty in chest .  She has been started on prednisone slow taper .  Follow up CT chest 08/09/2015 showed mild to moderate mediastinal and bilateral hilar lymphadenopathy with mild decreased since February 2017. moderate to severe emphysema. She was seen by Dr. Pia Mau thoracic surgery that feels patient is too high risk for mediastinoscopy.  She was treated with steroids and  CT chest in 07/2015 showed mild decreased in adenopathy.  She has been on prednisone 10mg  daily  She presents for an acute office visit today . Complains of  increased cough and congestion 3 weeks ago, called in abx and steroid burst. Currently on 20 mg daily . Still not feeling much better, gets winded easily despite 3-4l/m O2. Coughing up thick mucus along with wheezing .  O2 sats were decreased in low 80s on 3l/m, on arrival.  We discussed hospital admission. She declines. After neb treatment , O2 sats 94% on 4l/m with decreased WOB.     Prevnar and PVX and flu vaccines are utd. .  Remains on On Symbicort and Spiriva .  Denies chest pain, orthopnea , increased edema , calf pain, hemoptysis .   She is still smoking , smells very strong to cigs smoke. Discussed smoking cessation .  Past Medical History:  Diagnosis Date  . Arrhythmia   . Bronchiectasis march 2012   On CT chest. RUL. Mild  . CHF (congestive heart failure) ([place])   . Chicken pox   . Chronic diastolic heart failure ([place])   . Chronic respiratory failure ([place])    Followed in Pulmonary clinic/ [facility]/ Ramaswamy  - 06/30/2012 desat to 86%  RA walking 50 ft, recovered to 90% at rest - 06/30/2012  Walked 1lpm x 3 laps @ 185 ft each stopped due to  Sob, no desat  rec 02 2lpm with activity and  sleeping, ok at rest   . COPD (chronic obstructive pulmonary disease) ([place])     FEV-1 in 2008 was 63% with a diffusion capacity of 33%.   . Generalized anxiety disorder   . History of cervical cancer 1982  . Hyperlipidemia   . Hypertension   . Leg swelling    Venous doppler right 07/21/12 >>Neg    . 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 Medical non-compliance   . MITRAL VALVE PROLAPSE   . Obesity, unspecified   . Pulmonary nodule march 2012   64mm RUL and RLL 1st seen march 2012 CT, ?progression 12/2012 CT  . Seasonal allergies   . Tobacco abuse     Smokes one pack a day since age 56.  . Tobacco abuse    Current Outpatient Prescriptions on File Prior to Visit  Medication Sig Dispense Refill  . albuterol (PROVENTIL) (2.5 MG/3ML) 0.083% nebulizer solution USE 3 ML'S IN NEBULIZER EVERY 6 HOURS AS NEEDED FOR WHEEZING 100 vial 2  . ALPRAZolam (XANAX) 0.5 MG tablet TAKE 1 TABLET BY MOUTH TWICE A DAY AS NEEDED FOR ANXIETY 60 tablet 2  . budesonide-formoterol (SYMBICORT) 160-4.5 MCG/ACT inhaler Inhale 2 puffs into the lungs 2 (two) times daily. 3 Inhaler 2  . cloNIDine (CATAPRES) 0.1 MG tablet Take 1 tablet (0.1 mg total) by mouth 2 (two) times daily. 180 tablet 3  . famotidine (PEPCID) 20 MG tablet Take 1 tablet (20 mg total) by mouth at bedtime. 90 tablet 3  . fluticasone (FLONASE) 50 MCG/ACT nasal spray Place 2 sprays into both nostrils daily as needed for allergies or rhinitis. 16 g 11  . GLUCOCOM LANCETS 33G MISC Use with Test Strips to take blood sugars 100 each 5  . glucose blood (BAYER CONTOUR NEXT TEST) test strip Use to take blood sugars twice daily. 100 each 3  . Insulin Glargine (LANTUS SOLOSTAR) 100 UNIT/ML Solostar Pen Inject 32 Units into the skin daily at 10 pm. (Patient taking differently: Inject 35 Units into the skin daily at 10 pm. ) 5 pen 11  . Insulin Pen Needle (PEN NEEDLES) 32G X 4 MM MISC 15 Units by Other route at bedtime. -- use  with insulin 30 each 1  . losartan (COZAAR) 25 MG tablet Take 1 tablet (25 mg total) by mouth daily. 90 tablet 3  . metoprolol tartrate (LOPRESSOR) 25 MG tablet Take 0.5 tablets (12.5 mg total) by mouth 2 (two) times daily. 90 tablet 3  . omeprazole (PRILOSEC) 20 MG capsule Take 1 capsule (20 mg total) by mouth daily. 90 capsule 3  . predniSONE (DELTASONE) 20 MG tablet Take 1 tablet (20 mg total) by mouth daily with breakfast. 30 tablet 1  . simvastatin (ZOCOR) 10 MG tablet Take 2 tablets (20 mg total) by mouth daily at 6 PM. 180 tablet 1  . tiotropium (  SPIRIVA HANDIHALER) 18 MCG inhalation capsule PLACE 1 CAPSULE INTO HANDIHALER AND INHALE EVERY DAY 90 capsule 3  . VENTOLIN HFA 108 (90 Base) MCG/ACT inhaler INHALE 2 PUFFS INTO THE LUNGS EVERY 6 HOURS AS NEEDED 1 Inhaler 5   No current facility-administered medications on file prior to visit.      Review of Systems Constitutional:   No  weight loss, night sweats,  Fevers, chills,  +fatigue, or  lassitude.  HEENT:   No headaches,  Difficulty swallowing,  Tooth/dental problems, or  Sore throat,                No sneezing, itching, ear ache,  +nasal congestion, post nasal drip,   CV:  No chest pain,  Orthopnea, PND, swelling in lower extremities, anasarca, dizziness, palpitations, syncope.   GI  No heartburn, indigestion, abdominal pain, nausea, vomiting, diarrhea, change in bowel habits, loss of appetite, bloody stools.   Resp:   No chest wall deformity  Skin: no rash or lesions.  GU: no dysuria, change in color of urine, no urgency or frequency.  No flank pain, no hematuria   MS:  No joint pain or swelling.  No decreased range of motion.  No back pain.  Psych:  No change in mood or affect. No depression or anxiety.  No memory loss.         Objective:   Physical Exam  Vitals:   01/08/16 1522  BP: 96/68  Pulse: 95  SpO2: 95%    There is no height or weight on file to calculate BMI.    GEN: A/Ox3; pleasant , NAD, obese  in wc on O2 , strong smoke odor.    HEENT:  [place]/AT,  EACs-clear, TMs-wnl, NOSE-clear drainage, THROAT-clear, no lesions, no postnasal drip or exudate noted.   NECK:  Supple w/ fair ROM; no JVD; normal carotid impulses w/o bruits; no thyromegaly or nodules palpated; no lymphadenopathy.    RESP  Decreased BS in bases  Few trace wheezes on forced expiration , no accessory muscle use, no dullness to percussion  CARD:  RRR, no m/r/g  , tr peripheral edema, pulses intact, no cyanosis or clubbing.  GI:   Soft & nt; nml bowel sounds; no organomegaly or masses detected.   Musco: Warm bil, no deformities or joint swelling noted.   Neuro: alert, no focal deficits noted.    Skin: Warm, no lesions or rashes  Sura Canul NP-C  [facility]  01/09/2016

## 2016-01-08 NOTE — Assessment & Plan Note (Signed)
Recurrent exacerbation in active smoker  Recommended hospital admission , long discussion with pt and family  .She declines adamantly but says she promises to call back if not better.  After neb tx , she is improved with decreased wob and o2 sats adequate.. Will adjist to keep o2 to keep sat >90%  Plan  Patient Instructions  Augmentin 875mg  Twice daily  For 1 week .  Increase Prednisone 40mg  daily for 5 days then 30mg  daily for 5 days then hold at 20mg  daily  Mucinex DM Twice daily As needed  Cough/congestion .  Increase Oxygen 4l/m rest and 5l/m walking .  Follow up in 1-2 weeks Dr. [name] and As needed   Please contact office for sooner follow up if symptoms do not improve or worsen or seek emergency care  If you are not improving you will need to be admitted or if you get worse .

## 2016-01-08 NOTE — Progress Notes (Signed)
Injection given.   Jamariya Davidoff J Katsumi Wisler, MD  

## 2016-01-08 NOTE — Patient Instructions (Addendum)
Augmentin 875mg  Twice daily  For 1 week .  Increase Prednisone 40mg  daily for 5 days then 30mg  daily for 5 days then hold at 20mg  daily  Mucinex DM Twice daily As needed  Cough/congestion .  Increase Oxygen 4l/m rest and 5l/m walking .  Follow up in 1-2 weeks Dr. [name] and As needed   Please contact office for sooner follow up if symptoms do not improve or worsen or seek emergency care  If you are not improving you will need to be admitted or if you get worse .

## 2016-01-08 NOTE — Assessment & Plan Note (Signed)
Adjust o2 to keep sats >90%   Plan  Increase o2 4l/m rest , 5l./m walking

## 2016-01-08 NOTE — Telephone Encounter (Signed)
lmtcb X1 for pt. mucinex is an over the counter medication- no rx for this needed.

## 2016-01-09 ENCOUNTER — Encounter: Payer: Self-pay | Admitting: Internal Medicine

## 2016-01-09 DIAGNOSIS — E119 Type 2 diabetes mellitus without complications: Secondary | ICD-10-CM | POA: Diagnosis not present

## 2016-01-09 DIAGNOSIS — F419 Anxiety disorder, unspecified: Secondary | ICD-10-CM | POA: Diagnosis not present

## 2016-01-09 DIAGNOSIS — K219 Gastro-esophageal reflux disease without esophagitis: Secondary | ICD-10-CM | POA: Diagnosis not present

## 2016-01-09 DIAGNOSIS — J441 Chronic obstructive pulmonary disease with (acute) exacerbation: Secondary | ICD-10-CM | POA: Diagnosis not present

## 2016-01-09 DIAGNOSIS — I5032 Chronic diastolic (congestive) heart failure: Secondary | ICD-10-CM | POA: Diagnosis not present

## 2016-01-09 DIAGNOSIS — I110 Hypertensive heart disease with heart failure: Secondary | ICD-10-CM | POA: Diagnosis not present

## 2016-01-09 NOTE — Telephone Encounter (Signed)
Is otc. She can get it otc  Dr. Brand Males, M.D., [facility].C.P Pulmonary and Critical Care Medicine Staff Physician [place] [facility] Pager: [phone], If no answer or between  15:00h - 7:00h: call [phone]  01/09/2016 5:54 PM

## 2016-01-09 NOTE — Telephone Encounter (Signed)
Called and spoke to pt. Pt is requesting a Mucinex rx. This has not been prescribed by MR before.  MR please advise on Mucinex strength and frequency.

## 2016-01-09 NOTE — Assessment & Plan Note (Signed)
Discuss repeat CT Chest ~07/2016 on return

## 2016-01-10 NOTE — Telephone Encounter (Signed)
Pt aware of MR recommendations and her her understanding. Nothing further needed.

## 2016-01-11 DIAGNOSIS — I110 Hypertensive heart disease with heart failure: Secondary | ICD-10-CM | POA: Diagnosis not present

## 2016-01-11 DIAGNOSIS — I5032 Chronic diastolic (congestive) heart failure: Secondary | ICD-10-CM | POA: Diagnosis not present

## 2016-01-11 DIAGNOSIS — K219 Gastro-esophageal reflux disease without esophagitis: Secondary | ICD-10-CM | POA: Diagnosis not present

## 2016-01-11 DIAGNOSIS — F419 Anxiety disorder, unspecified: Secondary | ICD-10-CM | POA: Diagnosis not present

## 2016-01-11 DIAGNOSIS — J441 Chronic obstructive pulmonary disease with (acute) exacerbation: Secondary | ICD-10-CM | POA: Diagnosis not present

## 2016-01-11 DIAGNOSIS — E119 Type 2 diabetes mellitus without complications: Secondary | ICD-10-CM | POA: Diagnosis not present

## 2016-01-12 ENCOUNTER — Telehealth: Payer: Self-pay | Admitting: Emergency Medicine

## 2016-01-12 DIAGNOSIS — I5032 Chronic diastolic (congestive) heart failure: Secondary | ICD-10-CM | POA: Diagnosis not present

## 2016-01-12 DIAGNOSIS — E119 Type 2 diabetes mellitus without complications: Secondary | ICD-10-CM | POA: Diagnosis not present

## 2016-01-12 DIAGNOSIS — F419 Anxiety disorder, unspecified: Secondary | ICD-10-CM | POA: Diagnosis not present

## 2016-01-12 DIAGNOSIS — K219 Gastro-esophageal reflux disease without esophagitis: Secondary | ICD-10-CM | POA: Diagnosis not present

## 2016-01-12 DIAGNOSIS — J441 Chronic obstructive pulmonary disease with (acute) exacerbation: Secondary | ICD-10-CM | POA: Diagnosis not present

## 2016-01-12 DIAGNOSIS — I110 Hypertensive heart disease with heart failure: Secondary | ICD-10-CM | POA: Diagnosis not present

## 2016-01-12 NOTE — Telephone Encounter (Signed)
Spoke with [facility], pt will be seen today for continuation of care. They are going to refax the orders.

## 2016-01-12 NOTE — Telephone Encounter (Signed)
Pt called and stated [facility] got verbal orders to continue care but needs the orders faxed over before they are able to come out and continue to see patient. Please advise thanks

## 2016-01-16 ENCOUNTER — Other Ambulatory Visit: Payer: Self-pay | Admitting: Internal Medicine

## 2016-01-18 ENCOUNTER — Telehealth: Payer: Self-pay | Admitting: Internal Medicine

## 2016-01-18 DIAGNOSIS — K219 Gastro-esophageal reflux disease without esophagitis: Secondary | ICD-10-CM | POA: Diagnosis not present

## 2016-01-18 DIAGNOSIS — I110 Hypertensive heart disease with heart failure: Secondary | ICD-10-CM | POA: Diagnosis not present

## 2016-01-18 DIAGNOSIS — E119 Type 2 diabetes mellitus without complications: Secondary | ICD-10-CM | POA: Diagnosis not present

## 2016-01-18 DIAGNOSIS — F419 Anxiety disorder, unspecified: Secondary | ICD-10-CM | POA: Diagnosis not present

## 2016-01-18 DIAGNOSIS — J441 Chronic obstructive pulmonary disease with (acute) exacerbation: Secondary | ICD-10-CM | POA: Diagnosis not present

## 2016-01-18 DIAGNOSIS — I5032 Chronic diastolic (congestive) heart failure: Secondary | ICD-10-CM | POA: Diagnosis not present

## 2016-01-18 NOTE — Telephone Encounter (Signed)
Wants to notify that [facility] is now recert.

## 2016-01-18 NOTE — Telephone Encounter (Signed)
Per MD, okay to recert. [facility] will continue Aid for pt. Spoke with Maudie Mercury to notify.

## 2016-01-20 DIAGNOSIS — K219 Gastro-esophageal reflux disease without esophagitis: Secondary | ICD-10-CM | POA: Diagnosis not present

## 2016-01-20 DIAGNOSIS — F419 Anxiety disorder, unspecified: Secondary | ICD-10-CM | POA: Diagnosis not present

## 2016-01-20 DIAGNOSIS — E1165 Type 2 diabetes mellitus with hyperglycemia: Secondary | ICD-10-CM | POA: Diagnosis not present

## 2016-01-20 DIAGNOSIS — I110 Hypertensive heart disease with heart failure: Secondary | ICD-10-CM | POA: Diagnosis not present

## 2016-01-20 DIAGNOSIS — Z8701 Personal history of pneumonia (recurrent): Secondary | ICD-10-CM | POA: Diagnosis not present

## 2016-01-20 DIAGNOSIS — Z8542 Personal history of malignant neoplasm of other parts of uterus: Secondary | ICD-10-CM | POA: Diagnosis not present

## 2016-01-20 DIAGNOSIS — Z9981 Dependence on supplemental oxygen: Secondary | ICD-10-CM | POA: Diagnosis not present

## 2016-01-20 DIAGNOSIS — J441 Chronic obstructive pulmonary disease with (acute) exacerbation: Secondary | ICD-10-CM | POA: Diagnosis not present

## 2016-01-20 DIAGNOSIS — E785 Hyperlipidemia, unspecified: Secondary | ICD-10-CM | POA: Diagnosis not present

## 2016-01-20 DIAGNOSIS — E669 Obesity, unspecified: Secondary | ICD-10-CM | POA: Diagnosis not present

## 2016-01-20 DIAGNOSIS — Z794 Long term (current) use of insulin: Secondary | ICD-10-CM | POA: Diagnosis not present

## 2016-01-20 DIAGNOSIS — Z720 Tobacco use: Secondary | ICD-10-CM | POA: Diagnosis not present

## 2016-01-20 DIAGNOSIS — I5032 Chronic diastolic (congestive) heart failure: Secondary | ICD-10-CM | POA: Diagnosis not present

## 2016-01-24 DIAGNOSIS — F419 Anxiety disorder, unspecified: Secondary | ICD-10-CM | POA: Diagnosis not present

## 2016-01-24 DIAGNOSIS — I110 Hypertensive heart disease with heart failure: Secondary | ICD-10-CM | POA: Diagnosis not present

## 2016-01-24 DIAGNOSIS — K219 Gastro-esophageal reflux disease without esophagitis: Secondary | ICD-10-CM | POA: Diagnosis not present

## 2016-01-24 DIAGNOSIS — I5032 Chronic diastolic (congestive) heart failure: Secondary | ICD-10-CM | POA: Diagnosis not present

## 2016-01-24 DIAGNOSIS — J441 Chronic obstructive pulmonary disease with (acute) exacerbation: Secondary | ICD-10-CM | POA: Diagnosis not present

## 2016-01-24 DIAGNOSIS — E1165 Type 2 diabetes mellitus with hyperglycemia: Secondary | ICD-10-CM | POA: Diagnosis not present

## 2016-01-26 ENCOUNTER — Ambulatory Visit: Payer: Medicare Other | Admitting: Adult Health

## 2016-01-26 DIAGNOSIS — I5032 Chronic diastolic (congestive) heart failure: Secondary | ICD-10-CM | POA: Diagnosis not present

## 2016-01-26 DIAGNOSIS — I110 Hypertensive heart disease with heart failure: Secondary | ICD-10-CM | POA: Diagnosis not present

## 2016-01-26 DIAGNOSIS — F419 Anxiety disorder, unspecified: Secondary | ICD-10-CM | POA: Diagnosis not present

## 2016-01-26 DIAGNOSIS — E1165 Type 2 diabetes mellitus with hyperglycemia: Secondary | ICD-10-CM | POA: Diagnosis not present

## 2016-01-26 DIAGNOSIS — J441 Chronic obstructive pulmonary disease with (acute) exacerbation: Secondary | ICD-10-CM | POA: Diagnosis not present

## 2016-01-26 DIAGNOSIS — K219 Gastro-esophageal reflux disease without esophagitis: Secondary | ICD-10-CM | POA: Diagnosis not present

## 2016-01-29 DIAGNOSIS — I110 Hypertensive heart disease with heart failure: Secondary | ICD-10-CM | POA: Diagnosis not present

## 2016-01-29 DIAGNOSIS — E1165 Type 2 diabetes mellitus with hyperglycemia: Secondary | ICD-10-CM | POA: Diagnosis not present

## 2016-01-29 DIAGNOSIS — F419 Anxiety disorder, unspecified: Secondary | ICD-10-CM | POA: Diagnosis not present

## 2016-01-29 DIAGNOSIS — I5032 Chronic diastolic (congestive) heart failure: Secondary | ICD-10-CM | POA: Diagnosis not present

## 2016-01-29 DIAGNOSIS — K219 Gastro-esophageal reflux disease without esophagitis: Secondary | ICD-10-CM | POA: Diagnosis not present

## 2016-01-29 DIAGNOSIS — J441 Chronic obstructive pulmonary disease with (acute) exacerbation: Secondary | ICD-10-CM | POA: Diagnosis not present

## 2016-02-01 ENCOUNTER — Telehealth: Payer: Self-pay | Admitting: Internal Medicine

## 2016-02-01 DIAGNOSIS — I110 Hypertensive heart disease with heart failure: Secondary | ICD-10-CM | POA: Diagnosis not present

## 2016-02-01 DIAGNOSIS — I5032 Chronic diastolic (congestive) heart failure: Secondary | ICD-10-CM | POA: Diagnosis not present

## 2016-02-01 DIAGNOSIS — K219 Gastro-esophageal reflux disease without esophagitis: Secondary | ICD-10-CM | POA: Diagnosis not present

## 2016-02-01 DIAGNOSIS — J441 Chronic obstructive pulmonary disease with (acute) exacerbation: Secondary | ICD-10-CM | POA: Diagnosis not present

## 2016-02-01 DIAGNOSIS — E1165 Type 2 diabetes mellitus with hyperglycemia: Secondary | ICD-10-CM | POA: Diagnosis not present

## 2016-02-01 DIAGNOSIS — F419 Anxiety disorder, unspecified: Secondary | ICD-10-CM | POA: Diagnosis not present

## 2016-02-01 NOTE — Telephone Encounter (Signed)
Ok for pulse ox > /= 88%  Dr. Brand Males, M.D., [facility].C.P Pulmonary and Critical Care Medicine Staff Physician [place] [facility] Pager: [phone], If no answer or between  15:00h - 7:00h: call [phone]  02/01/2016 12:38 PM

## 2016-02-01 NOTE — Telephone Encounter (Signed)
Spoke with Selinda Orion and made her aware of MR's recommendations. Nothing further needed.

## 2016-02-01 NOTE — Telephone Encounter (Signed)
Spoke with [facility], needing clarification of where MR wants her O2 levels to be. Pt has been ranging 88-91% O2. Pt is not in any dsitress but told [name] that her O2 levels were supposed to stay around 95%.   Per last OV notes pt was advised to change O2 to 4 liters with rest and 5 liters with exertion and to titrate prn to keep O2 above 90%.  Pt is having a hard time keeping O2 levels above 90% on the 5 liters O2. Please advise Dr [name]. Thanks.    Chronic respiratory failure ([place]) - Melvenia Needles, NP at 01/08/2016 3:21 PM   Status: Written  Related Problem: Chronic respiratory failure ([place])    Adjust o2 to keep sats >90%   Plan  Increase o2 4l/m rest , 5l./m walking

## 2016-02-08 DIAGNOSIS — K219 Gastro-esophageal reflux disease without esophagitis: Secondary | ICD-10-CM | POA: Diagnosis not present

## 2016-02-08 DIAGNOSIS — F419 Anxiety disorder, unspecified: Secondary | ICD-10-CM | POA: Diagnosis not present

## 2016-02-08 DIAGNOSIS — I5032 Chronic diastolic (congestive) heart failure: Secondary | ICD-10-CM | POA: Diagnosis not present

## 2016-02-08 DIAGNOSIS — J441 Chronic obstructive pulmonary disease with (acute) exacerbation: Secondary | ICD-10-CM | POA: Diagnosis not present

## 2016-02-08 DIAGNOSIS — I110 Hypertensive heart disease with heart failure: Secondary | ICD-10-CM | POA: Diagnosis not present

## 2016-02-08 DIAGNOSIS — E1165 Type 2 diabetes mellitus with hyperglycemia: Secondary | ICD-10-CM | POA: Diagnosis not present

## 2016-02-09 ENCOUNTER — Ambulatory Visit: Payer: Medicare Other

## 2016-02-09 ENCOUNTER — Telehealth: Payer: Self-pay | Admitting: Internal Medicine

## 2016-02-09 ENCOUNTER — Ambulatory Visit: Payer: Medicare Other | Admitting: Adult Health

## 2016-02-09 DIAGNOSIS — I110 Hypertensive heart disease with heart failure: Secondary | ICD-10-CM | POA: Diagnosis not present

## 2016-02-09 DIAGNOSIS — K219 Gastro-esophageal reflux disease without esophagitis: Secondary | ICD-10-CM | POA: Diagnosis not present

## 2016-02-09 DIAGNOSIS — J441 Chronic obstructive pulmonary disease with (acute) exacerbation: Secondary | ICD-10-CM | POA: Diagnosis not present

## 2016-02-09 DIAGNOSIS — E1165 Type 2 diabetes mellitus with hyperglycemia: Secondary | ICD-10-CM | POA: Diagnosis not present

## 2016-02-09 DIAGNOSIS — F419 Anxiety disorder, unspecified: Secondary | ICD-10-CM | POA: Diagnosis not present

## 2016-02-09 DIAGNOSIS — I5032 Chronic diastolic (congestive) heart failure: Secondary | ICD-10-CM | POA: Diagnosis not present

## 2016-02-09 NOTE — Telephone Encounter (Signed)
Spoke with pt. And made her aware of message. She had no further questions. Nothing further is needed

## 2016-02-09 NOTE — Telephone Encounter (Signed)
MW  Please Advise-  This is a MR pt. She called in today saying she has been monitoring her o2 and she has been running between 78-83% at 4L. She says she has not been over exerting herself. She does not feel like she is in distress as far as her breathing goes. She wants to know what should she do. Below are the recc. When she saw TP. She did not know if the predisone has something to do with this.    2. Melvenia Needles, NP (Nurse Practitioner) at 01/08/2016 3:10 PM - Signed    Augmentin 875mg  Twice daily  For 1 week .  Increase Prednisone 40mg  daily for 5 days then 30mg  daily for 5 days then hold at 20mg  daily  Mucinex DM Twice daily As needed  Cough/congestion .  Increase Oxygen 4l/m rest and 5l/m walking .  Follow up in 1-2 weeks Dr. [name] and As needed   Please contact office for sooner follow up if symptoms do not improve or worsen or seek emergency care  If you are not improving you will need to be admitted or if you get worse .

## 2016-02-09 NOTE — Telephone Encounter (Signed)
Katie spoke with TP and the understanding is patient needs to go to ED since O2 levels on Oxygen are low.

## 2016-02-09 NOTE — Telephone Encounter (Signed)
Attempted to contact Muniz with [facility]. No answer, no option to leave a message. Will try back.

## 2016-02-09 NOTE — Telephone Encounter (Signed)
[phone] pt calling back

## 2016-02-15 DIAGNOSIS — I5032 Chronic diastolic (congestive) heart failure: Secondary | ICD-10-CM | POA: Diagnosis not present

## 2016-02-15 DIAGNOSIS — F419 Anxiety disorder, unspecified: Secondary | ICD-10-CM | POA: Diagnosis not present

## 2016-02-15 DIAGNOSIS — I110 Hypertensive heart disease with heart failure: Secondary | ICD-10-CM | POA: Diagnosis not present

## 2016-02-15 DIAGNOSIS — J441 Chronic obstructive pulmonary disease with (acute) exacerbation: Secondary | ICD-10-CM | POA: Diagnosis not present

## 2016-02-15 DIAGNOSIS — K219 Gastro-esophageal reflux disease without esophagitis: Secondary | ICD-10-CM | POA: Diagnosis not present

## 2016-02-15 DIAGNOSIS — E1165 Type 2 diabetes mellitus with hyperglycemia: Secondary | ICD-10-CM | POA: Diagnosis not present

## 2016-02-16 ENCOUNTER — Other Ambulatory Visit: Payer: Self-pay | Admitting: Internal Medicine

## 2016-02-21 DIAGNOSIS — F419 Anxiety disorder, unspecified: Secondary | ICD-10-CM | POA: Diagnosis not present

## 2016-02-21 DIAGNOSIS — K219 Gastro-esophageal reflux disease without esophagitis: Secondary | ICD-10-CM | POA: Diagnosis not present

## 2016-02-21 DIAGNOSIS — I110 Hypertensive heart disease with heart failure: Secondary | ICD-10-CM | POA: Diagnosis not present

## 2016-02-21 DIAGNOSIS — E1165 Type 2 diabetes mellitus with hyperglycemia: Secondary | ICD-10-CM | POA: Diagnosis not present

## 2016-02-21 DIAGNOSIS — I5032 Chronic diastolic (congestive) heart failure: Secondary | ICD-10-CM | POA: Diagnosis not present

## 2016-02-21 DIAGNOSIS — J441 Chronic obstructive pulmonary disease with (acute) exacerbation: Secondary | ICD-10-CM | POA: Diagnosis not present

## 2016-02-22 ENCOUNTER — Emergency Department (HOSPITAL_COMMUNITY): Payer: Medicare Other

## 2016-02-22 ENCOUNTER — Encounter (HOSPITAL_COMMUNITY): Payer: Self-pay | Admitting: Emergency Medicine

## 2016-02-22 ENCOUNTER — Inpatient Hospital Stay (HOSPITAL_COMMUNITY)
Admission: EM | Admit: 2016-02-22 | Discharge: 2016-03-01 | DRG: 190 | Disposition: A | Discharge status: Home Health | Payer: Medicare Other | Attending: Internal Medicine | Admitting: Internal Medicine

## 2016-02-22 DIAGNOSIS — I272 Pulmonary hypertension, unspecified: Secondary | ICD-10-CM | POA: Diagnosis not present

## 2016-02-22 DIAGNOSIS — Z794 Long term (current) use of insulin: Secondary | ICD-10-CM

## 2016-02-22 DIAGNOSIS — Z888 Allergy status to other drugs, medicaments and biological substances status: Secondary | ICD-10-CM

## 2016-02-22 DIAGNOSIS — K219 Gastro-esophageal reflux disease without esophagitis: Secondary | ICD-10-CM | POA: Diagnosis present

## 2016-02-22 DIAGNOSIS — E785 Hyperlipidemia, unspecified: Secondary | ICD-10-CM | POA: Diagnosis present

## 2016-02-22 DIAGNOSIS — J9690 Respiratory failure, unspecified, unspecified whether with hypoxia or hypercapnia: Secondary | ICD-10-CM | POA: Diagnosis not present

## 2016-02-22 DIAGNOSIS — J181 Lobar pneumonia, unspecified organism: Secondary | ICD-10-CM

## 2016-02-22 DIAGNOSIS — E860 Dehydration: Secondary | ICD-10-CM | POA: Diagnosis present

## 2016-02-22 DIAGNOSIS — J9621 Acute and chronic respiratory failure with hypoxia: Secondary | ICD-10-CM | POA: Diagnosis not present

## 2016-02-22 DIAGNOSIS — Z9981 Dependence on supplemental oxygen: Secondary | ICD-10-CM | POA: Diagnosis not present

## 2016-02-22 DIAGNOSIS — J441 Chronic obstructive pulmonary disease with (acute) exacerbation: Secondary | ICD-10-CM

## 2016-02-22 DIAGNOSIS — J9600 Acute respiratory failure, unspecified whether with hypoxia or hypercapnia: Secondary | ICD-10-CM

## 2016-02-22 DIAGNOSIS — Z6836 Body mass index (BMI) 36.0-36.9, adult: Secondary | ICD-10-CM | POA: Diagnosis not present

## 2016-02-22 DIAGNOSIS — I5032 Chronic diastolic (congestive) heart failure: Secondary | ICD-10-CM | POA: Diagnosis present

## 2016-02-22 DIAGNOSIS — C8592 Non-Hodgkin lymphoma, unspecified, intrathoracic lymph nodes: Secondary | ICD-10-CM | POA: Diagnosis present

## 2016-02-22 DIAGNOSIS — E119 Type 2 diabetes mellitus without complications: Secondary | ICD-10-CM

## 2016-02-22 DIAGNOSIS — E1165 Type 2 diabetes mellitus with hyperglycemia: Secondary | ICD-10-CM | POA: Diagnosis present

## 2016-02-22 DIAGNOSIS — J9611 Chronic respiratory failure with hypoxia: Secondary | ICD-10-CM

## 2016-02-22 DIAGNOSIS — Z9119 Patient's noncompliance with other medical treatment and regimen: Secondary | ICD-10-CM

## 2016-02-22 DIAGNOSIS — Z800 Family history of malignant neoplasm of digestive organs: Secondary | ICD-10-CM | POA: Diagnosis not present

## 2016-02-22 DIAGNOSIS — R0682 Tachypnea, not elsewhere classified: Secondary | ICD-10-CM | POA: Diagnosis not present

## 2016-02-22 DIAGNOSIS — IMO0002 Reserved for concepts with insufficient information to code with codable children: Secondary | ICD-10-CM

## 2016-02-22 DIAGNOSIS — J440 Chronic obstructive pulmonary disease with acute lower respiratory infection: Secondary | ICD-10-CM | POA: Diagnosis present

## 2016-02-22 DIAGNOSIS — F411 Generalized anxiety disorder: Secondary | ICD-10-CM | POA: Diagnosis present

## 2016-02-22 DIAGNOSIS — I110 Hypertensive heart disease with heart failure: Secondary | ICD-10-CM | POA: Diagnosis present

## 2016-02-22 DIAGNOSIS — J9622 Acute and chronic respiratory failure with hypercapnia: Secondary | ICD-10-CM | POA: Diagnosis present

## 2016-02-22 DIAGNOSIS — J189 Pneumonia, unspecified organism: Secondary | ICD-10-CM

## 2016-02-22 DIAGNOSIS — E669 Obesity, unspecified: Secondary | ICD-10-CM | POA: Diagnosis present

## 2016-02-22 DIAGNOSIS — K5900 Constipation, unspecified: Secondary | ICD-10-CM | POA: Diagnosis present

## 2016-02-22 DIAGNOSIS — N179 Acute kidney failure, unspecified: Secondary | ICD-10-CM | POA: Diagnosis present

## 2016-02-22 DIAGNOSIS — I5033 Acute on chronic diastolic (congestive) heart failure: Secondary | ICD-10-CM | POA: Diagnosis present

## 2016-02-22 DIAGNOSIS — I10 Essential (primary) hypertension: Secondary | ICD-10-CM | POA: Diagnosis present

## 2016-02-22 DIAGNOSIS — E7800 Pure hypercholesterolemia, unspecified: Secondary | ICD-10-CM | POA: Diagnosis present

## 2016-02-22 DIAGNOSIS — E872 Acidosis: Secondary | ICD-10-CM | POA: Diagnosis present

## 2016-02-22 DIAGNOSIS — Z8541 Personal history of malignant neoplasm of cervix uteri: Secondary | ICD-10-CM

## 2016-02-22 DIAGNOSIS — F17210 Nicotine dependence, cigarettes, uncomplicated: Secondary | ICD-10-CM | POA: Diagnosis present

## 2016-02-22 DIAGNOSIS — I341 Nonrheumatic mitral (valve) prolapse: Secondary | ICD-10-CM | POA: Diagnosis present

## 2016-02-22 DIAGNOSIS — Z7952 Long term (current) use of systemic steroids: Secondary | ICD-10-CM

## 2016-02-22 DIAGNOSIS — Z720 Tobacco use: Secondary | ICD-10-CM | POA: Diagnosis not present

## 2016-02-22 DIAGNOSIS — Z90710 Acquired absence of both cervix and uterus: Secondary | ICD-10-CM

## 2016-02-22 DIAGNOSIS — E118 Type 2 diabetes mellitus with unspecified complications: Secondary | ICD-10-CM

## 2016-02-22 DIAGNOSIS — R0602 Shortness of breath: Secondary | ICD-10-CM | POA: Diagnosis not present

## 2016-02-22 DIAGNOSIS — E8729 Other acidosis: Secondary | ICD-10-CM

## 2016-02-22 LAB — I-STAT ARTERIAL BLOOD GAS, ED
Acid-Base Excess: 2 mmol/L (ref 0.0–2.0)
Bicarbonate: 30.9 mmol/L — ABNORMAL HIGH (ref 20.0–28.0)
Bicarbonate: 28.8 mmol/L — ABNORMAL HIGH (ref 20.0–28.0)
O2 Saturation: 93 %
O2 Saturation: 50 %
PCO2 ART: 68.8 mmHg — AB (ref 32.0–48.0)
PCO2 ART: 67.9 mmHg — AB (ref 32.0–48.0)
PO2 ART: 80 mmHg — AB (ref 83.0–108.0)
PO2 ART: 33 mmHg — AB (ref 83.0–108.0)
Patient temperature: 98.7
Patient temperature: 98.6
TCO2: 33 mmol/L (ref 0–100)
TCO2: 31 mmol/L (ref 0–100)
pH, Arterial: 7.26 — ABNORMAL LOW (ref 7.350–7.450)
pH, Arterial: 7.236 — ABNORMAL LOW (ref 7.350–7.450)

## 2016-02-22 LAB — BASIC METABOLIC PANEL
Anion gap: 8 (ref 5–15)
BUN: 22 mg/dL — AB (ref 6–20)
CO2: 31 mmol/L (ref 22–32)
CREATININE: 1.19 mg/dL — AB (ref 0.44–1.00)
Calcium: 9.6 mg/dL (ref 8.9–10.3)
Chloride: 106 mmol/L (ref 101–111)
GFR calc Af Amer: 53 mL/min — ABNORMAL LOW (ref >60)
GFR, EST NON AFRICAN AMERICAN: 46 mL/min — AB (ref >60)
GLUCOSE: 125 mg/dL — AB (ref 65–99)
POTASSIUM: 4.3 mmol/L (ref 3.5–5.1)
Sodium: 145 mmol/L (ref 135–145)

## 2016-02-22 LAB — POCT I-STAT 3, ART BLOOD GAS (G3+)
Bicarbonate: 28.8 mmol/L — ABNORMAL HIGH (ref 20.0–28.0)
O2 Saturation: 50 %
PCO2 ART: 67.9 mmHg — AB (ref 32.0–48.0)
PH ART: 7.236 — AB (ref 7.350–7.450)
Patient temperature: 98.6
TCO2: 31 mmol/L (ref 0–100)
pO2, Arterial: 33 mmHg — CL (ref 83.0–108.0)

## 2016-02-22 LAB — CBC WITH DIFFERENTIAL/PLATELET
BASOS ABS: 0 10*3/uL (ref 0.0–0.1)
Basophils Relative: 0 %
EOS ABS: 0.1 10*3/uL (ref 0.0–0.7)
EOS PCT: 1 %
HCT: 47 % — ABNORMAL HIGH (ref 36.0–46.0)
Hemoglobin: 12.9 g/dL (ref 12.0–15.0)
LYMPHS ABS: 2.8 10*3/uL (ref 0.7–4.0)
LYMPHS PCT: 26 %
MCH: 21.6 pg — ABNORMAL LOW (ref 26.0–34.0)
MCHC: 27.4 g/dL — ABNORMAL LOW (ref 30.0–36.0)
MCV: 78.6 fL (ref 78.0–100.0)
MONO ABS: 1.3 10*3/uL — AB (ref 0.1–1.0)
Monocytes Relative: 11 %
Neutro Abs: 6.9 10*3/uL (ref 1.7–7.7)
Neutrophils Relative %: 62 %
PLATELETS: 314 10*3/uL (ref 150–400)
RBC: 5.98 MIL/uL — ABNORMAL HIGH (ref 3.87–5.11)
RDW: 18.7 % — AB (ref 11.5–15.5)
WBC: 11.1 10*3/uL — ABNORMAL HIGH (ref 4.0–10.5)

## 2016-02-22 LAB — RESPIRATORY PANEL BY PCR
ADENOVIRUS-RVPPCR: NOT DETECTED
Bordetella pertussis: NOT DETECTED
CHLAMYDOPHILA PNEUMONIAE-RVPPCR: NOT DETECTED
CORONAVIRUS 229E-RVPPCR: NOT DETECTED
CORONAVIRUS HKU1-RVPPCR: NOT DETECTED
CORONAVIRUS NL63-RVPPCR: NOT DETECTED
CORONAVIRUS OC43-RVPPCR: NOT DETECTED
INFLUENZA A-RVPPCR: NOT DETECTED
Influenza B: NOT DETECTED
MYCOPLASMA PNEUMONIAE-RVPPCR: NOT DETECTED
Metapneumovirus: NOT DETECTED
PARAINFLUENZA VIRUS 1-RVPPCR: NOT DETECTED
Parainfluenza Virus 2: NOT DETECTED
Parainfluenza Virus 3: NOT DETECTED
Parainfluenza Virus 4: NOT DETECTED
Respiratory Syncytial Virus: NOT DETECTED
Rhinovirus / Enterovirus: NOT DETECTED

## 2016-02-22 LAB — MAGNESIUM: MAGNESIUM: 2.2 mg/dL (ref 1.7–2.4)

## 2016-02-22 LAB — GLUCOSE, CAPILLARY
GLUCOSE-CAPILLARY: 138 mg/dL — AB (ref 65–99)
GLUCOSE-CAPILLARY: 161 mg/dL — AB (ref 65–99)
GLUCOSE-CAPILLARY: 219 mg/dL — AB (ref 65–99)

## 2016-02-22 LAB — BRAIN NATRIURETIC PEPTIDE: B Natriuretic Peptide: 161.2 pg/mL — ABNORMAL HIGH (ref 0.0–100.0)

## 2016-02-22 LAB — INFLUENZA PANEL BY PCR (TYPE A & B)
Influenza A By PCR: NEGATIVE
Influenza B By PCR: NEGATIVE

## 2016-02-22 LAB — I-STAT TROPONIN, ED: Troponin i, poc: 0 ng/mL (ref 0.00–0.08)

## 2016-02-22 LAB — PHOSPHORUS: PHOSPHORUS: 4.7 mg/dL — AB (ref 2.5–4.6)

## 2016-02-22 LAB — TROPONIN I

## 2016-02-22 LAB — CBG MONITORING, ED: GLUCOSE-CAPILLARY: 245 mg/dL — AB (ref 65–99)

## 2016-02-22 MED ORDER — ACETAMINOPHEN 325 MG PO TABS
650.0000 mg | ORAL_TABLET | Freq: Four times a day (QID) | ORAL | Status: DC | PRN
Start: 2016-02-22 — End: 2016-03-01
  Administered 2016-02-25 – 2016-03-01 (×3): 650 mg via ORAL
  Filled 2016-02-22 (×3): qty 2

## 2016-02-22 MED ORDER — DEXTROSE 5 % IV SOLN
500.0000 mg | Freq: Once | INTRAVENOUS | Status: AC
  Administered 2016-02-22: 500 mg via INTRAVENOUS
  Filled 2016-02-22: qty 500

## 2016-02-22 MED ORDER — IPRATROPIUM-ALBUTEROL 0.5-2.5 (3) MG/3ML IN SOLN: 3.0000 mL | RESPIRATORY_TRACT | Status: DC | PRN

## 2016-02-22 MED ORDER — METHYLPREDNISOLONE SODIUM SUCC 125 MG IJ SOLR
125.0000 mg | Freq: Two times a day (BID) | INTRAMUSCULAR | Status: DC
  Administered 2016-02-22 – 2016-02-23 (×2): 125 mg via INTRAVENOUS
  Filled 2016-02-22 (×3): qty 2

## 2016-02-22 MED ORDER — DEXTROSE 5 % IV SOLN
1.0000 g | Freq: Once | INTRAVENOUS | Status: AC
  Administered 2016-02-22: 1 g via INTRAVENOUS
  Filled 2016-02-22: qty 10

## 2016-02-22 MED ORDER — ONDANSETRON HCL 4 MG PO TABS: 4.0000 mg | ORAL_TABLET | Freq: Four times a day (QID) | ORAL | Status: DC | PRN

## 2016-02-22 MED ORDER — ALBUTEROL SULFATE (2.5 MG/3ML) 0.083% IN NEBU
2.5000 mg | INHALATION_SOLUTION | RESPIRATORY_TRACT | Status: DC | PRN
  Administered 2016-02-26 – 2016-02-27 (×2): 2.5 mg via RESPIRATORY_TRACT

## 2016-02-22 MED ORDER — HYDRALAZINE HCL 20 MG/ML IJ SOLN
10.0000 mg | Freq: Three times a day (TID) | INTRAMUSCULAR | Status: DC | PRN
  Administered 2016-02-26: 10 mg via INTRAVENOUS
  Filled 2016-02-22: qty 1

## 2016-02-22 MED ORDER — SODIUM CHLORIDE 0.9 % IV SOLN: INTRAVENOUS | Status: DC

## 2016-02-22 MED ORDER — INSULIN GLARGINE 100 UNIT/ML ~~LOC~~ SOLN
16.0000 [IU] | Freq: Every day | SUBCUTANEOUS | Status: DC
Start: 2016-02-22 — End: 2016-02-23
  Administered 2016-02-22: 16 [IU] via SUBCUTANEOUS
  Filled 2016-02-22 (×2): qty 0.16

## 2016-02-22 MED ORDER — PANTOPRAZOLE SODIUM 40 MG IV SOLR
40.0000 mg | Freq: Two times a day (BID) | INTRAVENOUS | Status: DC
  Administered 2016-02-22 – 2016-02-24 (×4): 40 mg via INTRAVENOUS
  Filled 2016-02-22 (×4): qty 40

## 2016-02-22 MED ORDER — NICOTINE 21 MG/24HR TD PT24
21.0000 mg | MEDICATED_PATCH | Freq: Every day | TRANSDERMAL | Status: DC
  Administered 2016-02-29 – 2016-03-01 (×2): 21 mg via TRANSDERMAL
  Filled 2016-02-22 (×5): qty 1

## 2016-02-22 MED ORDER — DEXTROSE 5 % IV SOLN
1.0000 g | INTRAVENOUS | Status: DC
  Administered 2016-02-23 – 2016-02-28 (×6): 1 g via INTRAVENOUS
  Filled 2016-02-22 (×6): qty 10

## 2016-02-22 MED ORDER — METHYLPREDNISOLONE SODIUM SUCC 125 MG IJ SOLR
125.0000 mg | Freq: Once | INTRAMUSCULAR | Status: AC
  Administered 2016-02-22: 125 mg via INTRAVENOUS
  Filled 2016-02-22: qty 2

## 2016-02-22 MED ORDER — VANCOMYCIN HCL 10 G IV SOLR
2000.0000 mg | Freq: Once | INTRAVENOUS | Status: AC
  Administered 2016-02-22: 2000 mg via INTRAVENOUS
  Filled 2016-02-22: qty 2000

## 2016-02-22 MED ORDER — IPRATROPIUM-ALBUTEROL 0.5-2.5 (3) MG/3ML IN SOLN
3.0000 mL | Freq: Four times a day (QID) | RESPIRATORY_TRACT | Status: DC
  Administered 2016-02-22 – 2016-02-28 (×24): 3 mL via RESPIRATORY_TRACT
  Filled 2016-02-22 (×26): qty 3

## 2016-02-22 MED ORDER — INSULIN ASPART 100 UNIT/ML ~~LOC~~ SOLN
0.0000 [IU] | SUBCUTANEOUS | Status: DC
  Administered 2016-02-22: 3 [IU] via SUBCUTANEOUS
  Administered 2016-02-22: 7 [IU] via SUBCUTANEOUS
  Administered 2016-02-22: 4 [IU] via SUBCUTANEOUS
  Administered 2016-02-22: 7 [IU] via SUBCUTANEOUS
  Administered 2016-02-23 (×3): 4 [IU] via SUBCUTANEOUS
  Filled 2016-02-22: qty 1

## 2016-02-22 MED ORDER — ALBUTEROL (5 MG/ML) CONTINUOUS INHALATION SOLN
15.0000 mg/h | INHALATION_SOLUTION | Freq: Once | RESPIRATORY_TRACT | Status: AC
  Administered 2016-02-22: 15 mg/h via RESPIRATORY_TRACT
  Filled 2016-02-22: qty 20

## 2016-02-22 MED ORDER — ONDANSETRON HCL 4 MG/2ML IJ SOLN: 4.0000 mg | Freq: Four times a day (QID) | INTRAMUSCULAR | Status: DC | PRN

## 2016-02-22 MED ORDER — IPRATROPIUM BROMIDE 0.02 % IN SOLN
1.0000 mg | Freq: Once | RESPIRATORY_TRACT | Status: AC
  Administered 2016-02-22: 1 mg via RESPIRATORY_TRACT
  Filled 2016-02-22: qty 5

## 2016-02-22 MED ORDER — ALBUTEROL SULFATE (2.5 MG/3ML) 0.083% IN NEBU
5.0000 mg | INHALATION_SOLUTION | Freq: Once | RESPIRATORY_TRACT | Status: AC
  Administered 2016-02-22: 5 mg via RESPIRATORY_TRACT
  Filled 2016-02-22: qty 6

## 2016-02-22 MED ORDER — SODIUM CHLORIDE 0.9 % IV BOLUS (SEPSIS): 1000.0000 mL | Freq: Once | INTRAVENOUS | Status: DC

## 2016-02-22 MED ORDER — ACETAMINOPHEN 650 MG RE SUPP: 650.0000 mg | Freq: Four times a day (QID) | RECTAL | Status: DC | PRN

## 2016-02-22 MED ORDER — SODIUM CHLORIDE 0.9 % IV BOLUS (SEPSIS)
1000.0000 mL | Freq: Once | INTRAVENOUS | Status: AC
  Administered 2016-02-22: 1000 mL via INTRAVENOUS

## 2016-02-22 MED ORDER — HEPARIN SODIUM (PORCINE) 5000 UNIT/ML IJ SOLN
5000.0000 [IU] | Freq: Three times a day (TID) | INTRAMUSCULAR | Status: DC
  Administered 2016-02-22 – 2016-03-01 (×25): 5000 [IU] via SUBCUTANEOUS
  Filled 2016-02-22 (×27): qty 1

## 2016-02-22 NOTE — H&P (Addendum)
[facility] History and Physical  [name] [id number] DOB: 03-Jul-1945 DOA: 02/22/2016  Referring physician:  PCP: Toni Rail, MD   Chief Complaint: Resp distress  HPI: [name] is a 71 y.o. female  past medical history significant for heart failure, chronic respiratory failure, COPD, high blood pressure, high cholesterol, high blood, allergies, current tobacco abuse presents to the hospital with respiratory distress. History gathered from chart as patient is lethargic. Patient came from home with complaint of shortness of breath 1 week and cough productive of yellow sputum.   ED Course:  She received 1 continuous neb treatment, DuoNeb and IV Solu-Medrol. In the emergency room she was placed on BiPAP and given antibiotics. Hospitalists were consulted for admission.   Review of Systems:  Unable to get complete review of systems due to with lethargy   Past Medical History:  Diagnosis Date  . Arrhythmia   . Bronchiectasis march 2012   On CT chest. RUL. Mild  . CHF (congestive heart failure) ([place])   . Chicken pox   . Chronic diastolic heart failure ([place])   . Chronic respiratory failure ([place])    Followed in Pulmonary clinic/ [facility]/ [name]  - 06/30/2012 desat to 86%  RA walking 50 ft, recovered to 90% at rest - 06/30/2012  Walked 1lpm x 3 laps @ 185 ft each stopped due to  Sob, no desat  rec 02 2lpm with activity and sleeping, ok at rest   . COPD (chronic obstructive pulmonary disease) ([place])     FEV-1 in 2008 was 63% with a diffusion capacity of 33%.   . Generalized anxiety disorder   . History of cervical cancer 1982  . Hyperlipidemia   . Hypertension   . Leg swelling    Venous doppler right 07/21/12 >>Neg    . 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 Medical non-compliance   . MITRAL VALVE PROLAPSE   . Obesity, unspecified   . Pulmonary nodule march 2012   3mm RUL and RLL 1st seen march 2012 CT, ?progression  12/2012 CT  . Seasonal allergies   . Tobacco abuse     Smokes one pack a day since age 48.  . Tobacco abuse    Past Surgical History:  Procedure Laterality Date  . TOTAL ABDOMINAL HYSTERECTOMY     Social History:  reports that she has been smoking Cigarettes.  She has a 22.00 pack-year smoking history. She has never used smokeless tobacco. She reports that she drinks alcohol. She reports that she does not use drugs.  Allergies  Allergen Reactions  . Ambien [Zolpidem Tartrate] Other (See Comments)    Causes nightmares    Family History  Problem Relation Age of Onset  . Other Father     MVA  . Esophageal cancer Mother      Prior to Admission medications   Medication Sig Start Date End Date Taking? Authorizing Provider  albuterol (PROVENTIL) (2.5 MG/3ML) 0.083% nebulizer solution USE ONE VIAL IN NEBULIZER EVERY 6 HOURS AS NEEDED FOR WHEEZING 01/16/16  Yes Toni Males, MD  ALPRAZolam (XANAX) 0.5 MG tablet TAKE 1 TABLET BY MOUTH TWICE A DAY AS NEEDED FOR ANXIETY 11/20/15  Yes Toni Rail, MD  budesonide-formoterol (SYMBICORT) 160-4.5 MCG/ACT inhaler Inhale 2 puffs into the lungs 2 (two) times daily. 11/06/15  Yes Toni Males, MD  cloNIDine (CATAPRES) 0.1 MG tablet Take 1 tablet (0.1 mg total) by mouth 2 (two) times daily. 03/20/15  Yes Toni Rail, MD  famotidine (PEPCID) 20 MG tablet Take 1 tablet (20 mg total) by mouth at bedtime. 03/20/15  Yes Toni Rail, MD  fluticasone (FLONASE) 50 MCG/ACT nasal spray Place 2 sprays into both nostrils daily as needed for allergies or rhinitis. 01/25/15  Yes Toni Rail, MD  GLUCOCOM LANCETS 33G MISC Use with Test Strips to take blood sugars 07/20/15  Yes Toni Rail, MD  glucose blood (BAYER CONTOUR NEXT TEST) test strip Use to take blood sugars twice daily. 07/20/15  Yes Toni Rail, MD  Insulin Glargine (LANTUS SOLOSTAR) 100 UNIT/ML Solostar Pen Inject 32 Units into the skin daily at 10 pm. 07/20/15  Yes Toni Rail, MD    Insulin Pen Needle (PEN NEEDLES) 32G X 4 MM MISC 15 Units by Other route at bedtime. -- use with insulin 07/07/15  Yes Toni Rail, MD  losartan (COZAAR) 25 MG tablet Take 1 tablet (25 mg total) by mouth daily. 07/20/15  Yes Toni Rail, MD  metoprolol tartrate (LOPRESSOR) 25 MG tablet Take 0.5 tablets (12.5 mg total) by mouth 2 (two) times daily. 03/20/15  Yes Toni Rail, MD  omeprazole (PRILOSEC) 20 MG capsule Take 1 capsule (20 mg total) by mouth daily. 07/14/15  Yes Toni Rail, MD  predniSONE (DELTASONE) 20 MG tablet Take 1 tablet (20 mg total) by mouth daily with breakfast. 12/27/15  Yes Toni Males, MD  simvastatin (ZOCOR) 10 MG tablet Take 2 tablets (20 mg total) by mouth daily at 6 PM. 03/30/15  Yes Toni Rail, MD  tiotropium (SPIRIVA HANDIHALER) 18 MCG inhalation capsule PLACE 1 CAPSULE INTO HANDIHALER AND INHALE EVERY DAY 02/16/15  Yes Toni Space, MD  VENTOLIN HFA 108 (90 Base) MCG/ACT inhaler INHALE 2 PUFFS INTO THE LUNGS EVERY 6 HOURS AS NEEDED Patient taking differently: INHALE 2 PUFFS INTO THE LUNGS EVERY 6 HOURS AS NEEDED FOR SHORTNESS OF BREATH 12/26/15  Yes Toni Males, MD   Physical Exam: Vitals:   02/22/16 0615 02/22/16 0645 02/22/16 0715 02/22/16 0730  BP: (!) 101/49 (!) 108/49 125/71 120/76  Pulse: 111 110 107 107  Resp: 17 19 17 23   SpO2: 96% 93% 95% 97%    Wt Readings from Last 3 Encounters:  12/04/15 100.1 kg (220 lb 9.6 oz)  11/13/15 99.3 kg (219 lb)  10/31/15 100.2 kg (221 lb)    General:  Lethargic Eyes:  PERRL, normal lids, iris ENT:  grossly normal hearing, lips & tongue Neck:  no LAD, masses or thyromegaly Cardiovascular:  Tachy, RR, no m/r/g. No LE edema. Delayed cap refill. Poor skin turgor. Respiratory:  Diffuse insp and exp wheeze. Tachypnea, resp distress. incro wob. Abdomen:  soft, ntnd, obese Skin:  no rash or induration seen on limited exam Musculoskeletal:  grossly normal tone BUE/BLE Psychiatric:  Unable to assess due to  mental status Neurologic:  CN 2-12 grossly intact, moves all extremities in coordinated fashion. GCS 9          Labs on Admission:  Basic Metabolic Panel:  Recent Labs Lab 02/22/16 0353  NA 145  K 4.3  CL 106  CO2 31  GLUCOSE 125*  [name] 22*  CREATININE 1.19*  CALCIUM 9.6   Liver Function Tests: No results for input(s): AST, ALT, ALKPHOS, BILITOT, PROT, ALBUMIN in the last 168 hours. No results for input(s): LIPASE, AMYLASE in the last 168 hours. No results for input(s): AMMONIA in the last 168 hours. CBC:  Recent Labs  Lab 02/22/16 0334  WBC 11.1*  NEUTROABS 6.9  HGB 12.9  HCT 47.0*  MCV 78.6  PLT 314   Cardiac Enzymes: No results for input(s): CKTOTAL, CKMB, CKMBINDEX, TROPONINI in the last 168 hours.  BNP (last 3 results)  Recent Labs  02/22/16 0334  BNP 161.2*    ProBNP (last 3 results)  Recent Labs  03/10/15 1449  PROBNP 16.0     Creatinine clearance cannot be calculated (Unknown ideal weight.)  CBG: No results for input(s): GLUCAP in the last 168 hours.  Radiological Exams on Admission: Dg Chest Portable 1 View  Result Date: 02/22/2016 CLINICAL DATA:  Shortness of breath tonight. EXAM: PORTABLE CHEST 1 VIEW COMPARISON:  Radiograph 01/08/2016, CT 08/09/2015 FINDINGS: The heart is enlarged, stable cardiomegaly. Worsening peribronchial thickening and interstitial opacities primarily at the bases. Emphysema with hyperinflation. Difficult to exclude left pleural effusion. No pneumothorax. IMPRESSION: Cardiomegaly. Worsening peribronchial thickening and interstitial opacities, may be pulmonary edema or atypical infection. Difficult to exclude left pleural effusion. Electronically Signed   By: Jeb Levering M.D.   On: 02/22/2016 04:10    EKG: Independently reviewed. Ventricular rate 103, PR interval 110, castration 78, QTC 45, sinus tachycardia, flattened T-wave in V2-no STEMI, compared to 06/2013 flat T wave in V2 is  normal   Assessment/Plan Principal Problem:   Acute on chronic respiratory failure with hypoxia and hypercapnia (HCC) Active Problems:   Hyperlipidemia   Essential hypertension   Tobacco abuse   Chronic diastolic heart failure (HCC)   Generalized anxiety disorder   Diabetes (HCC)   COPD exacerbation (HCC)  COPD exacerbation  With viral vs bacterial pna Scheduled DuoNeb's When necessary albuterol Antibiotic-Given azithromycin and Rocephin, add vancomycin and will continue Rocephin patient's initial flu swab was negative but on repeat testing may appear positive later date so since viral flu infections the lungs are more likely to cause staph and chest x-ray has a staph distribution vancomycin was added Repeat x-ray in the morning Respiratory therapy consult Oxygen therapy Continuous pulse oximetry  Patient on 4 L oxygen at baseline Respiratory viral panel ordered Pt stated she is against intubation  Dehydration IVF Foley to monitor status, pt very dry and sleepy, needs fluids to help with acidosis No fluids in the emergency room by EDP but appears drawn exam we'll bolus.  CHF Appears dry Holding meds but will need to be added back later  EKG change T-wave flattening likely due to respiratory distress Initial troponin negative the emergency room and will recheck   Allergies Hold Flonase  Anxiety Xanax held  Hypertension When necessary hydralazine 10 mg IV as needed for severe blood pressure Hold Lopressor, clonidine, Cozaar  Hyperlipidemia Hold statin  AKI Nl func at baseline Cr 1.19 on admit Monitors I&Os  Reflux IV Protonix Hold home medication  DM SSI Q4  Tobacco abuse Nicotine patch  Code Status: FULL  DVT Prophylaxis: SCDs Family Communication: Called son and left VM Disposition Plan: Pending Improvement  Status: Inpt SDU  Elwin Mocha, MD Family Medicine [facility] [email or web address] Password [email or web address]

## 2016-02-22 NOTE — ED Notes (Signed)
Pt states she has had about 3 flare-ups in the last week with this shortness of breath. Pt reports she has a home health nurse working with her. Pt reports everytime they take her off prednisone she has an attack.

## 2016-02-22 NOTE — Progress Notes (Signed)
RT transported pt on Bipap from ED to 4E21. Pt's vitals remained stable throughout.  RT will continue to monitor.

## 2016-02-22 NOTE — Progress Notes (Addendum)
Discussed patient with Dr. Leonides Schanz. [name] is a 71 year old female with pmh HTN, COPD, oxygen dependent on 4 L nasal cannula oxygen, diastolic CHF last EF [id number] in 02/2015; who presents with complaints of chest tightness, SOB 1 week, and productive cough with yellowish sputum production.  VS: Temp not yet obtained, pulse 96-101, respirations 19-27, blood pressures maintained, O2 saturations 94-100% on BiPAP.  Labs: WBC 11.1, CO2 31, BUN 22, creatinine 1.19, BNP pending. ABG reveals respiratory acidosis with pH 7.26, PCO2 68.8, PO2 80. Imaging studies showed cardiomegaly with possible edema versus pneumonia.  She received one continuous albuterol nebulizer treatment, then 1 DuoNeb, and 125 mg Solu-Medrol IV. After being placed to BiPAP patient was started on antibiotics of ceftriaxone and azithromycin. TRH admit to a stepdown bed for presumed COPD exacerbation +/- CHF versus pneumonia.

## 2016-02-22 NOTE — Progress Notes (Signed)
   02/22/16 0400  BiPAP/CPAP/SIPAP  BiPAP/CPAP/SIPAP Pt Type Adult  Mask Type Full face mask  Mask Size Medium  Set Rate 20 breaths/min  Respiratory Rate 26 breaths/min  IPAP 16 cmH20  EPAP 6 cmH2O  Oxygen Percent 60 %  Flow Rate 0 lpm  Minute Ventilation 13.3  Leak 32  Peak Inspiratory Pressure (PIP) 17  Tidal Volume (Vt) 543  BiPAP/CPAP/SIPAP BiPAP  Patient Home Equipment No  Auto Titrate No  Press High Alarm 25 cmH2O  Press Low Alarm 5 cmH2O  BiPAP/CPAP /SiPAP Vitals  Pulse Rate 101  Resp 26  SpO2 98 %

## 2016-02-22 NOTE — ED Provider Notes (Signed)
By signing my name below, I, Charolotte Eke, attest that this documentation has been prepared under the direction and in the presence of [place], DO. Electronically Signed: Charolotte Eke, Scribe. 02/22/16. 3:40 AM.  TIME SEEN: 3:26 AM  CHIEF COMPLAINT: shortness of breath  HPI: [name] is a 71 y.o. female with h/o COPD, CHF, HTN, HLD who presents to the Emergency Department complaining of severe shortness of breath that began 1 week ago. Pt has associated lack of energy and chest tightness. Patient reports subjective fevers and chills and productive cough with yellow, beige sputum production. Pt was given 2 nebulizer treatments PTA by EMS with limited relief. Pt is on home oxygen at 4 lpm. Pt has a prescription for albuterol inhaler, but has a hx of medical non-compliance.   ROS: See HPI Constitutional: Subjective fever  Eyes: no drainage  ENT: no runny nose   Cardiovascular:   chest pain  Resp: SOB  GI: no vomiting GU: no dysuria Integumentary: no rash  Allergy: no hives  Musculoskeletal: no leg swelling  Neurological: no slurred speech ROS otherwise negative  PAST MEDICAL HISTORY/PAST SURGICAL HISTORY:  Past Medical History:  Diagnosis Date  . Arrhythmia   . Bronchiectasis march 2012   On CT chest. RUL. Mild  . CHF (congestive heart failure) ([place])   . Chicken pox   . Chronic diastolic heart failure ([place])   . Chronic respiratory failure ([place])    Followed in Pulmonary clinic/ [facility]/ Ramaswamy  - 06/30/2012 desat to 86%  RA walking 50 ft, recovered to 90% at rest - 06/30/2012  Walked 1lpm x 3 laps @ 185 ft each stopped due to  Sob, no desat  rec 02 2lpm with activity and sleeping, ok at rest   . COPD (chronic obstructive pulmonary disease) ([place])     FEV-1 in 2008 was 63% with a diffusion capacity of 33%.   . Generalized anxiety disorder   . History of cervical cancer 1982  . Hyperlipidemia   . Hypertension   . Leg swelling    Venous doppler right 07/21/12 >>Neg     . 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 Medical non-compliance   . MITRAL VALVE PROLAPSE   . Obesity, unspecified   . Pulmonary nodule march 2012   12mm RUL and RLL 1st seen march 2012 CT, ?progression 12/2012 CT  . Seasonal allergies   . Tobacco abuse     Smokes one pack a day since age 37.  . Tobacco abuse     MEDICATIONS:  Prior to Admission medications   Medication Sig Start Date End Date Taking? Authorizing Provider  albuterol (PROVENTIL) (2.5 MG/3ML) 0.083% nebulizer solution USE ONE VIAL IN NEBULIZER EVERY 6 HOURS AS NEEDED FOR WHEEZING 01/16/16   Brand Males, MD  ALPRAZolam (XANAX) 0.5 MG tablet TAKE 1 TABLET BY MOUTH TWICE A DAY AS NEEDED FOR ANXIETY 11/20/15   Binnie Rail, MD  budesonide-formoterol (SYMBICORT) 160-4.5 MCG/ACT inhaler Inhale 2 puffs into the lungs 2 (two) times daily. 11/06/15   Brand Males, MD  cloNIDine (CATAPRES) 0.1 MG tablet Take 1 tablet (0.1 mg total) by mouth 2 (two) times daily. 03/20/15   Binnie Rail, MD  famotidine (PEPCID) 20 MG tablet Take 1 tablet (20 mg total) by mouth at bedtime. 03/20/15   Binnie Rail, MD  fluticasone (FLONASE) 50 MCG/ACT nasal spray Place 2 sprays into both nostrils daily as needed for allergies  or rhinitis. 01/25/15   Binnie Rail, MD  GLUCOCOM LANCETS 33G MISC Use with Test Strips to take blood sugars 07/20/15   Binnie Rail, MD  glucose blood (BAYER CONTOUR NEXT TEST) test strip Use to take blood sugars twice daily. 07/20/15   Binnie Rail, MD  Insulin Glargine (LANTUS SOLOSTAR) 100 UNIT/ML Solostar Pen Inject 32 Units into the skin daily at 10 pm. Patient taking differently: Inject 35 Units into the skin daily at 10 pm.  07/20/15   Binnie Rail, MD  Insulin Pen Needle (PEN NEEDLES) 32G X 4 MM MISC 15 Units by Other route at bedtime. -- use with insulin 07/07/15   Binnie Rail, MD  losartan (COZAAR) 25 MG tablet Take 1 tablet (25 mg total) by mouth daily. 07/20/15   Binnie Rail, MD  metoprolol tartrate (LOPRESSOR) 25 MG tablet Take 0.5 tablets (12.5 mg total) by mouth 2 (two) times daily. 03/20/15   Binnie Rail, MD  omeprazole (PRILOSEC) 20 MG capsule Take 1 capsule (20 mg total) by mouth daily. 07/14/15   Binnie Rail, MD  predniSONE (DELTASONE) 10 MG tablet 4 tabs for 5  days, then 3 tabs for 5  days, 2 tabs daily -hold at this dose 01/08/16   Tammy S Parrett, NP  predniSONE (DELTASONE) 20 MG tablet Take 1 tablet (20 mg total) by mouth daily with breakfast. 12/27/15   Brand Males, MD  simvastatin (ZOCOR) 10 MG tablet Take 2 tablets (20 mg total) by mouth daily at 6 PM. 03/30/15   Binnie Rail, MD  simvastatin (ZOCOR) 20 MG tablet TAKE 1 TABLET BY MOUTH AT BEDTIME 02/19/16   Binnie Rail, MD  tiotropium (SPIRIVA HANDIHALER) 18 MCG inhalation capsule PLACE 1 CAPSULE INTO HANDIHALER AND INHALE EVERY DAY 02/16/15   Noralee Space, MD  VENTOLIN HFA 108 (90 Base) MCG/ACT inhaler INHALE 2 PUFFS INTO THE LUNGS EVERY 6 HOURS AS NEEDED 12/26/15   Brand Males, MD    ALLERGIES:  Allergies  Allergen Reactions  . Ambien [Zolpidem Tartrate] Other (See Comments)    Causes nightmares    SOCIAL HISTORY:  Social History  Substance Use Topics  . Smoking status: Current Every Day Smoker    Packs/day: 0.50    Years: 44.00    Types: Cigarettes  . Smokeless tobacco: Never Used     Comment: down to 4 cigs per day plus 5 ecigs with nicotien daily//3.14.17  . Alcohol use 0.0 oz/week     Comment: occasional    FAMILY HISTORY: Family History  Problem Relation Age of Onset  . Other Father     MVA  . Esophageal cancer Mother     EXAM: BP (!) 108/49   Pulse 110   Resp 19   SpO2 93%  CONSTITUTIONAL: Alert and oriented and responds appropriately to questions. Obese, in mild to moderate respiratory distress HEAD: Normocephalic EYES: Conjunctivae clear, PERRL, EOMI ENT: normal nose; no rhinorrhea; moist mucous membranes NECK: Supple, no meningismus, no nuchal  rigidity, no LAD, no JVD  CARD: Regular and tachycardic; S1 and S2 appreciated; no murmurs, no clicks, no rubs, no gallops RESP: Patient in mild to moderate respiratory distress. Speaking short sentences, diffuse x-ray wheezes and diminished at bases bilaterally. No rhonchi or rales. Has yellow sputum production with wet cough. ABD/GI: Normal bowel sounds; non-distended; soft, non-tender, no rebound, no guarding, no peritoneal signs, no hepatosplenomegaly BACK:  The back appears normal and is non-tender to palpation, there  is no CVA tenderness EXT: Normal ROM in all joints; non-tender to palpation; no edema; normal capillary refill; no cyanosis, no calf tenderness or swelling    SKIN: Normal color for age and race; warm; no rash NEURO: Moves all extremities equally, sensation to light touch intact diffusely, cranial nerves II through XII intact, normal speech PSYCH: The patient's mood and manner are appropriate. Grooming and personal hygiene are appropriate.  MEDICAL DECISION MAKING: Patient here with likely COPD exacerbation with possible pneumonia as well. Reports subjective fevers. Will obtain a rectal temperature here. Will place patient on BiPAP, obtain ABG. We'll give albuterol, Atrovent, Solu-Medrol. She may need antibiotics. Flu swab pending.  ED PROGRESS: Labs show mild leukocytosis. Troponin negative. Chest x-ray shows atypical infection versus pulmonary edema. She does not appear significantly volume overloaded but BNP is pending. Flu swab negative. Patient significantly improving on BiPAP. ABG shows respiratory acidosis. Patient will need admission. PCP is Dr. Celso Amy.   5:30 AM  Discussed patient's case with hospitalist, Dr. Tamala Julian.  Recommend admission to inpatient, stepdown bed.  I will place holding orders per their request. Patient and family (if present) updated with plan. Care transferred to hospitalist service.  I reviewed all nursing notes, vitals, pertinent old records,  EKGs, labs, imaging (as available).    EKG Interpretation  Date/Time:  Thursday February 22 2016 03:16:45 EST Ventricular Rate:  103 PR Interval:    QRS Duration: 78 QT Interval:  370 QTC Calculation: 485 R Axis:   111 Text Interpretation:  Sinus tachycardia Right axis deviation Borderline T abnormalities, anterior leads Baseline wander in lead(s) V6 No significant change since last tracing Confirmed by Kateri Balch,  DO, Brytani Voth [id number]) on 02/22/2016 3:24:22 AM       CRITICAL CARE Performed by: Nyra Jabs   Total critical care time: 45 minutes  Critical care time was exclusive of separately billable procedures and treating other patients.  Critical care was necessary to treat or prevent imminent or life-threatening deterioration.  Critical care was time spent personally by me on the following activities: development of treatment plan with patient and/or surrogate as well as nursing, discussions with consultants, evaluation of patient's response to treatment, examination of patient, obtaining history from patient or surrogate, ordering and performing treatments and interventions, ordering and review of laboratory studies, ordering and review of radiographic studies, pulse oximetry and re-evaluation of patient's condition.   I personally performed the services described in this documentation, which was scribed in my presence. The recorded information has been reviewed and is accurate.     [place], DO 02/22/16 [phone]

## 2016-02-22 NOTE — Progress Notes (Signed)
0806 ABG was actually venous instead of arterial not a true arterial PO2 showing up on the results. RT has faxed paperwork to istat office to have them change the results to venous. MD notified.

## 2016-02-22 NOTE — Progress Notes (Signed)
Pharmacy Antibiotic Note  [name] is a 71 y.o. female admitted on 02/22/2016 with pneumonia.  Pharmacy has been consulted for vancomycin dosing.  Patient received vancomycin 2g, ceftriaxone 1g and azithromycin 500mg  once in the ED.  Plan: Vancomycin 1250mg  IV every 24 hours.  Goal trough 15-20 mcg/mL.  Ceftriaxone 1g IV q24h  Monitor culture data, renal function and clinical course [facility] at [facility] pr[facility]     No data recorded.   Recent Labs Lab 02/22/16 0334 02/22/16 0353  WBC 11.1*  --   CREATININE  --  1.19*    CrCl cannot be calculated (Unknown ideal weight.).    Allergies  Allergen Reactions  . Ambien [Zolpidem Tartrate] Other (See Comments)    Causes nightmares     Andrey Cota. Diona Foley, PharmD, BCPS Clinical Pharmacist [phone] 02/22/2016 8:10 AM

## 2016-02-22 NOTE — ED Notes (Signed)
Pt remains on bipap , pt lethargic and hard to arouse , MD notified a nd order placed for ABG

## 2016-02-22 NOTE — ED Notes (Signed)
Report called  

## 2016-02-22 NOTE — ED Triage Notes (Signed)
[facility] reports pt is SOB for a couple of days now. Pt is supposed to be on home oxygen but was not upon first responder arrival. EMS gave 2 neb treatments with the second one still going upon arrival. Pt had audible wheezing upon room arrival. EMS reports initial lung sounds were diminished in all fields. Initial oxygen saturations were in the high 70's. Current oxygen saturation is 93%], P-98, BP-140/90. EMS attempted IVx3 unuccessful .   Pt states she has had about 3 flare-ups in the last week with this shortness of breath. Pt reports she has a home health nurse working with her. Pt reports everytime they take her off prednisone she has an attack.

## 2016-02-23 ENCOUNTER — Inpatient Hospital Stay (HOSPITAL_COMMUNITY): Payer: Medicare Other

## 2016-02-23 LAB — CBC
HEMATOCRIT: 40.2 % (ref 36.0–46.0)
HEMOGLOBIN: 10.7 g/dL — AB (ref 12.0–15.0)
MCH: 21.4 pg — ABNORMAL LOW (ref 26.0–34.0)
MCHC: 26.6 g/dL — AB (ref 30.0–36.0)
MCV: 80.6 fL (ref 78.0–100.0)
Platelets: 301 10*3/uL (ref 150–400)
RBC: 4.99 MIL/uL (ref 3.87–5.11)
RDW: 18.8 % — ABNORMAL HIGH (ref 11.5–15.5)
WBC: 14.3 10*3/uL — AB (ref 4.0–10.5)

## 2016-02-23 LAB — GLUCOSE, CAPILLARY
GLUCOSE-CAPILLARY: 168 mg/dL — AB (ref 65–99)
GLUCOSE-CAPILLARY: 198 mg/dL — AB (ref 65–99)
GLUCOSE-CAPILLARY: 135 mg/dL — AB (ref 65–99)
Glucose-Capillary: 169 mg/dL — ABNORMAL HIGH (ref 65–99)
Glucose-Capillary: 147 mg/dL — ABNORMAL HIGH (ref 65–99)

## 2016-02-23 LAB — BASIC METABOLIC PANEL
ANION GAP: 10 (ref 5–15)
BUN: 17 mg/dL (ref 6–20)
CHLORIDE: 105 mmol/L (ref 101–111)
CO2: 28 mmol/L (ref 22–32)
Calcium: 9.2 mg/dL (ref 8.9–10.3)
Creatinine, Ser: 0.93 mg/dL (ref 0.44–1.00)
GFR calc Af Amer: >60 mL/min (ref >60)
Glucose, Bld: 198 mg/dL — ABNORMAL HIGH (ref 65–99)
POTASSIUM: 4.9 mmol/L (ref 3.5–5.1)
Sodium: 143 mmol/L (ref 135–145)

## 2016-02-23 LAB — MRSA PCR SCREENING: MRSA BY PCR: POSITIVE — AB

## 2016-02-23 MED ORDER — METHYLPREDNISOLONE SODIUM SUCC 125 MG IJ SOLR
80.0000 mg | Freq: Two times a day (BID) | INTRAMUSCULAR | Status: DC
  Administered 2016-02-24 (×2): 80 mg via INTRAVENOUS
  Filled 2016-02-23: qty 2

## 2016-02-23 MED ORDER — INSULIN ASPART 100 UNIT/ML ~~LOC~~ SOLN: 0.0000 [IU] | Freq: Every day | SUBCUTANEOUS | Status: DC

## 2016-02-23 MED ORDER — SODIUM CHLORIDE 0.9 % IV SOLN
1250.0000 mg | INTRAVENOUS | Status: DC
  Administered 2016-02-23 – 2016-02-26 (×4): 1250 mg via INTRAVENOUS
  Filled 2016-02-23 (×4): qty 1250

## 2016-02-23 MED ORDER — POLYETHYLENE GLYCOL 3350 17 G PO PACK
17.0000 g | PACK | Freq: Every day | ORAL | Status: DC
  Administered 2016-02-23 – 2016-02-27 (×4): 17 g via ORAL
  Filled 2016-02-23 (×8): qty 1

## 2016-02-23 MED ORDER — ALPRAZOLAM 0.5 MG PO TABS
0.5000 mg | ORAL_TABLET | Freq: Once | ORAL | Status: AC
  Administered 2016-02-23: 0.5 mg via ORAL
  Filled 2016-02-23: qty 1

## 2016-02-23 MED ORDER — SENNOSIDES-DOCUSATE SODIUM 8.6-50 MG PO TABS
1.0000 | ORAL_TABLET | Freq: Two times a day (BID) | ORAL | Status: DC
  Administered 2016-02-24 – 2016-03-01 (×8): 1 via ORAL
  Filled 2016-02-23 (×12): qty 1

## 2016-02-23 MED ORDER — INSULIN GLARGINE 100 UNIT/ML ~~LOC~~ SOLN
22.0000 [IU] | Freq: Every day | SUBCUTANEOUS | Status: DC
  Administered 2016-02-23 – 2016-02-29 (×7): 22 [IU] via SUBCUTANEOUS
  Filled 2016-02-23 (×8): qty 0.22

## 2016-02-23 MED ORDER — INSULIN ASPART 100 UNIT/ML ~~LOC~~ SOLN
0.0000 [IU] | Freq: Three times a day (TID) | SUBCUTANEOUS | Status: DC
  Administered 2016-02-23: 3 [IU] via SUBCUTANEOUS
  Administered 2016-02-24: 4 [IU] via SUBCUTANEOUS
  Administered 2016-02-24 – 2016-02-26 (×3): 3 [IU] via SUBCUTANEOUS
  Administered 2016-02-26 – 2016-02-28 (×4): 4 [IU] via SUBCUTANEOUS
  Administered 2016-02-28 – 2016-02-29 (×2): 7 [IU] via SUBCUTANEOUS
  Administered 2016-03-01: 3 [IU] via SUBCUTANEOUS

## 2016-02-23 NOTE — Care Management Note (Signed)
Case Management Note  Patient Details  Name: Aureliana Wassmann MRN: [id number] Date of Birth: 1945-09-05  Subjective/Objective:   Pt lives alone, has home health RN and aide through [facility].  [facility] CSW placed pt on the waiting list for in-home aide through [place] but states that waiting list has > 200 people.  Pt has home O2 through [facility].                           Expected Discharge Plan:  [place]  Discharge planning Services  CM Consult  HH Arranged:  RN, Nurse's Aide [facility] Agency:  [place]  Status of Service:  In process, will continue to follow  Girard Cooter, RN 02/23/2016, 3:04 PM

## 2016-02-23 NOTE — Progress Notes (Signed)
RT placed pt on a [place] HFNC at 15L, sat 96%.  Pt tolerating it well at this time. RN notified.

## 2016-02-23 NOTE — Progress Notes (Signed)
[facility]  Patient Status: Active (receiving services up to time of hospitalization)  [facility] is providing the following services: RN and HHA  If patient discharges after hours, please call [phone].   [name] 02/23/2016, 10:17 AM

## 2016-02-23 NOTE — Progress Notes (Addendum)
   Met w/ and prayed for patient @ bedside.  Will follow, as needed.  - Rev. [place] MDiv ThM

## 2016-02-23 NOTE — Progress Notes (Signed)
[place] TEAM 1 - Stepdown/ICU TEAM  [name]  [id number] DOB: 11-24-1945 DOA: 02/22/2016 PCP: Binnie Rail, MD    Brief Narrative:  71 y.o. female w/ a hx of heart failure, chronic respiratory failure, COPD, high blood pressure, high cholesterol, and current tobacco abuse who presented to the hospital with shortness of breath 1 week and cough productive of yellow sputum.   In the ED she received 1 continuous neb treatment and IV Solu-Medrol. She was placed on BiPAP and given antibiotics.   Subjective: The patient is sitting up in bed.  She is somewhat tachypneic but pleasant and conversant.  She states her breathing feels "about the same."  She is able to carry on a conversation but must pause after approximately every 2 sentences.  When speaking on high flow nasal cannula oxygen her sats dipped into the low 80s but quickly recover into the upper 80s when she quits talking.  She denies chest pain nausea vomiting or abdominal pain but does complain of constipation.  Assessment & Plan:  Acute bronchospastic COPD exacerbation - O2 dependant COPD Followed by [facility] (will ask them to follow in consult on 2/3) - goal sat will be 86-92% - cont maximal medical support - pt tells me she would not wish to be kept alive on a vent if it comes to that, but does wish for aggressive medical care short of that to include BIPAP  Possible B LL Pneumonia v/s other   Negative for Flu or rep virus - CXR in Dec 2017 suggest presence of same pathology noted on admit CXR - ?if this is true infiltrate, or other process - cont empiric abx for now - if resp status stabilizes will obtain CT chest to evaluate further   Possible lymphoma of chest W/u of pulmonary nodule and LAD has included a PET scan (March 2017) noting intense hypermetabolic adenopathy worrisome for lymphoma - pt not felt to be a candidate by TCTS for bx/further w/u due to severe COPD - nodes did decrease in size on f/u CT July  0000000  Grade 1 diastolic CHF - Pulm HTN ([id number] Hg) Noted on TTE Feb 2017 - no evidence of severe volume overload on exam  Filed Weights   02/22/16 1433 02/23/16 0500  Weight: 105.2 kg (231 lb 14.8 oz) 104 kg (229 lb 4.5 oz)    DM Poorly controlled on steroids - adjust tx and follow   Hypertension Well-controlled at present  Hyperlipidemia  Acute kidney injury Creatinine 1.19 at presentation - resolved   Recent Labs Lab 02/22/16 0353 02/23/16 0253  CREATININE 1.19* 0.93    GERD  Tobacco abuse Counseled on absolute need to discontinue all tobacco use  Obesity - Body mass index is 39.36 kg/m.  DVT prophylaxis: SCDs Code Status: DO NOT INTUBATE  Family Communication: no family present at time of exam  Disposition Plan: SDU  Consultants:  Pulmonary - pending   Procedures: None  Antimicrobials:  Ceftriaxone 1/31 > Vancomycin 2/1 > Azithromycin 1/31  Objective: Blood pressure 121/63, pulse (!) 107, temperature 97.6 F (36.4 C), temperature source Oral, resp. rate 18, height 5\' 4"  (1.626 m), weight 104 kg (229 lb 4.5 oz), SpO2 94 %.  Intake/Output Summary (Last 24 hours) at 02/23/16 1116 Last data filed at 02/23/16 0900  Gross per 24 hour  Intake              490 ml  Output  400 ml  Net               90 ml   Filed Weights   02/22/16 1433 02/23/16 0500  Weight: 105.2 kg (231 lb 14.8 oz) 104 kg (229 lb 4.5 oz)    Examination: General: modest resp distress - alert and able to converse Lungs: Very poor air movement bilateral bases with mild diffuse expiratory wheezing in other fields Cardiovascular: Tachycardic but regular without appreciable murmur Abdomen: Nontender, obese, soft, bowel sounds positive, no rebound, no ascites, no appreciable mass Extremities: No significant cyanosis or clubbing - 1+ edema bilateral lower extremities  CBC:  Recent Labs Lab 02/22/16 0334 02/23/16 0253  WBC 11.1* 14.3*  NEUTROABS 6.9  --   HGB 12.9 10.7*   HCT 47.0* 40.2  MCV 78.6 80.6  PLT 314 [id number]   Basic Metabolic Panel:  Recent Labs Lab 02/22/16 0353 02/22/16 0737 02/23/16 0253  NA 145  --  143  K 4.3  --  4.9  CL 106  --  105  CO2 31  --  28  GLUCOSE 125*  --  198*  BUN 22*  --  17  CREATININE 1.19*  --  0.93  CALCIUM 9.6  --  9.2  MG  --  2.2  --   PHOS  --  4.7*  --    GFR: Estimated Creatinine Clearance: 68 mL/min (by C-G formula based on SCr of 0.93 mg/dL).  Liver Function Tests: No results for input(s): AST, ALT, ALKPHOS, BILITOT, PROT, ALBUMIN in the last 168 hours. No results for input(s): LIPASE, AMYLASE in the last 168 hours. No results for input(s): AMMONIA in the last 168 hours.  Coagulation Profile: No results for input(s): INR, PROTIME in the last 168 hours.  Cardiac Enzymes:  Recent Labs Lab 02/22/16 0737  TROPONINI <0.03    HbA1C: Hemoglobin A1C  Date/Time Value Ref Range Status  07/07/2015 02:50 PM 10.9  Final   Hgb A1c MFr Bld  Date/Time Value Ref Range Status  10/31/2015 05:02 PM 7.7 (H) 4.6 - 6.5 % Final    Comment:    Glycemic Control Guidelines for People with Diabetes:Non Diabetic:  <6%Goal of Therapy: <7%Additional Action Suggested:  >8%   01/20/2015 03:15 PM 6.9 (H) 4.6 - 6.5 % Final    Comment:    Glycemic Control Guidelines for People with Diabetes:Non Diabetic:  <6%Goal of Therapy: <7%Additional Action Suggested:  >8%     CBG:  Recent Labs Lab 02/22/16 1615 02/22/16 2035 02/22/16 2350 02/23/16 0317 02/23/16 0757  GLUCAP 161* 138* 219* 169* 168*    Recent Results (from the past 240 hour(s))  Respiratory Panel by PCR     Status: None   Collection Time: 02/22/16  3:34 AM  Result Value Ref Range Status   Adenovirus NOT DETECTED NOT DETECTED Final   Coronavirus 229E NOT DETECTED NOT DETECTED Final   Coronavirus HKU1 NOT DETECTED NOT DETECTED Final   Coronavirus NL63 NOT DETECTED NOT DETECTED Final   Coronavirus OC43 NOT DETECTED NOT DETECTED Final   Metapneumovirus  NOT DETECTED NOT DETECTED Final   Rhinovirus / Enterovirus NOT DETECTED NOT DETECTED Final   Influenza A NOT DETECTED NOT DETECTED Final   Influenza B NOT DETECTED NOT DETECTED Final   Parainfluenza Virus 1 NOT DETECTED NOT DETECTED Final   Parainfluenza Virus 2 NOT DETECTED NOT DETECTED Final   Parainfluenza Virus 3 NOT DETECTED NOT DETECTED Final   Parainfluenza Virus 4 NOT DETECTED NOT DETECTED Final  Respiratory Syncytial Virus NOT DETECTED NOT DETECTED Final   Bordetella pertussis NOT DETECTED NOT DETECTED Final   Chlamydophila pneumoniae NOT DETECTED NOT DETECTED Final   Mycoplasma pneumoniae NOT DETECTED NOT DETECTED Final     Scheduled Meds: . cefTRIAXone (ROCEPHIN)  IV  1 g Intravenous Q24H  . heparin  5,000 Units Subcutaneous Q8H  . insulin aspart  0-20 Units Subcutaneous Q4H  . insulin glargine  16 Units Subcutaneous Q2200  . ipratropium-albuterol  3 mL Nebulization QID  . methylPREDNISolone (SOLU-MEDROL) injection  125 mg Intravenous Q12H  . nicotine  21 mg Transdermal Daily  . pantoprazole (PROTONIX) IV  40 mg Intravenous Q12H  . sodium chloride  1,000 mL Intravenous Once  . vancomycin  1,250 mg Intravenous Q24H     LOS: 1 day   Cherene Altes, MD [facility] Office  [phone] Pager - Text Page per Amion as per below:  On-Call/Text Page:      [email or web address]      password [email or web address]  If 7PM-7AM, please contact night-coverage [email or web address] Password [email or web address] 02/23/2016, 11:16 AM

## 2016-02-24 ENCOUNTER — Inpatient Hospital Stay (HOSPITAL_COMMUNITY): Payer: Medicare Other

## 2016-02-24 LAB — GLUCOSE, CAPILLARY
GLUCOSE-CAPILLARY: 174 mg/dL — AB (ref 65–99)
GLUCOSE-CAPILLARY: 194 mg/dL — AB (ref 65–99)
GLUCOSE-CAPILLARY: 148 mg/dL — AB (ref 65–99)
GLUCOSE-CAPILLARY: 151 mg/dL — AB (ref 65–99)

## 2016-02-24 MED ORDER — PANTOPRAZOLE SODIUM 40 MG PO TBEC
40.0000 mg | DELAYED_RELEASE_TABLET | Freq: Every day | ORAL | Status: DC
  Administered 2016-02-25 – 2016-03-01 (×6): 40 mg via ORAL
  Filled 2016-02-24 (×6): qty 1

## 2016-02-24 MED ORDER — METHYLPREDNISOLONE SODIUM SUCC 125 MG IJ SOLR
60.0000 mg | INTRAMUSCULAR | Status: AC
  Filled 2016-02-24 (×2): qty 2

## 2016-02-24 NOTE — Progress Notes (Signed)
[facility] TEAM 1 - Stepdown/ICU TEAM  [name]  [id number] DOB: 28-Sep-1945 DOA: 02/22/2016 PCP: Binnie Rail, MD    Brief Narrative:  71 y.o. female w/ a hx of heart failure, chronic respiratory failure, COPD, high blood pressure, high cholesterol, and current tobacco abuse who presented to the hospital with shortness of breath 1 week and cough productive of yellow sputum.   In the ED she received 1 continuous neb treatment and IV Solu-Medrol. She was placed on BiPAP and given antibiotics.   Subjective: The patient continues to require high-level oxygen support but appears more comfortable today.  She states her breathing has improved.  She denies current chest pain nausea vomiting or abdominal pain.  Assessment & Plan:  Acute bronchospastic COPD exacerbation - O2 dependant COPD Followed by Rafael Bihari - goal sat will be 86-92% - cont maximal medical support - pt tells me she would not wish to be kept alive on a vent if it comes to that, but does wish for aggressive medical care short of that to include BIPAP - will ask Pulm to see in consultation Monday   Possible B LL Pneumonia v/s other   Negative for Flu or rep virus - CXR in Dec 2017 suggest presence of same pathology noted on admit CXR - ?if this is true infiltrate, or other process - cont empiric abx for now - if resp status stabilizes will obtain CT chest to evaluate further   Possible lymphoma of chest W/u of pulmonary nodule and LAD has included a PET scan (March 2017) noting intense hypermetabolic adenopathy worrisome for lymphoma - pt not felt to be a candidate by TCTS for bx/further w/u due to severe COPD - nodes did decrease in size on f/u CT July 0000000  Grade 1 diastolic CHF - Pulm HTN (A999333 Hg) Noted on TTE Feb 2017 - no evidence of severe volume overload on exam - is not on chronic diuretic tx Filed Weights   02/22/16 1433 02/23/16 0500 02/24/16 0354  Weight: 105.2 kg (231 lb 14.8 oz) 104 kg (229 lb 4.5 oz) 104  kg (229 lb 4.5 oz)    DM CBG improved - wean steroids today and follow   Hypertension Blood pressure variable - follow without change today  Acute kidney injury Creatinine 1.19 at presentation - resolved   Recent Labs Lab 02/22/16 0353 02/23/16 0253  CREATININE 1.19* 0.93    GERD  Tobacco abuse Counseled on absolute need to discontinue all tobacco use  Obesity - Body mass index is 39.36 kg/m.   MRSA screen +  DVT prophylaxis: SCDs Code Status: DO NOT INTUBATE  Family Communication: no family present at time of exam  Disposition Plan: SDU  Consultants:  Pulmonary - pending   Procedures: None  Antimicrobials:  Ceftriaxone 1/31 > Vancomycin 2/1 > Azithromycin 1/31  Objective: Blood pressure 136/72, pulse (!) 116, temperature 98 F (36.7 C), temperature source Oral, resp. rate (!) 26, height 5\' 4"  (1.626 m), weight 104 kg (229 lb 4.5 oz), SpO2 95 %.  Intake/Output Summary (Last 24 hours) at 02/24/16 1558 Last data filed at 02/24/16 1400  Gross per 24 hour  Intake               50 ml  Output             1250 ml  Net            -1200 ml   Filed Weights   02/22/16 1433 02/23/16 0500 02/24/16  0354  Weight: 105.2 kg (231 lb 14.8 oz) 104 kg (229 lb 4.5 oz) 104 kg (229 lb 4.5 oz)    Examination: General: no acute resp distress Lungs: Very poor air movement bilateral bases persist - no signif wheezing  Cardiovascular: tachycardic - regular - no murmur Abdomen: Nontender, obese, soft, bowel sounds positive, no rebound, no ascites, no appreciable mass Extremities: No significant cyanosis or clubbing - 1+ edema bilateral lower extremities persists  CBC:  Recent Labs Lab 02/22/16 0334 02/23/16 0253  WBC 11.1* 14.3*  NEUTROABS 6.9  --   HGB 12.9 10.7*  HCT 47.0* 40.2  MCV 78.6 80.6  PLT 314 Q000111Q   Basic Metabolic Panel:  Recent Labs Lab 02/22/16 0353 02/22/16 0737 02/23/16 0253  NA 145  --  143  K 4.3  --  4.9  CL 106  --  105  CO2 31  --  28    GLUCOSE 125*  --  198*  BUN 22*  --  17  CREATININE 1.19*  --  0.93  CALCIUM 9.6  --  9.2  MG  --  2.2  --   PHOS  --  4.7*  --    GFR: Estimated Creatinine Clearance: 68 mL/min (by C-G formula based on SCr of 0.93 mg/dL).  Liver Function Tests: No results for input(s): AST, ALT, ALKPHOS, BILITOT, PROT, ALBUMIN in the last 168 hours. No results for input(s): LIPASE, AMYLASE in the last 168 hours. No results for input(s): AMMONIA in the last 168 hours.  Cardiac Enzymes:  Recent Labs Lab 02/22/16 0737  TROPONINI <0.03    HbA1C: Hemoglobin A1C  Date/Time Value Ref Range Status  07/07/2015 02:50 PM 10.9  Final   Hgb A1c MFr Bld  Date/Time Value Ref Range Status  10/31/2015 05:02 PM 7.7 (H) 4.6 - 6.5 % Final    Comment:    Glycemic Control Guidelines for People with Diabetes:Non Diabetic:  <6%Goal of Therapy: <7%Additional Action Suggested:  >8%   01/20/2015 03:15 PM 6.9 (H) 4.6 - 6.5 % Final    Comment:    Glycemic Control Guidelines for People with Diabetes:Non Diabetic:  <6%Goal of Therapy: <7%Additional Action Suggested:  >8%     CBG:  Recent Labs Lab 02/23/16 1214 02/23/16 1701 02/23/16 2153 02/24/16 0901 02/24/16 1304  GLUCAP 198* 147* 135* 174* 194*    Recent Results (from the past 240 hour(s))  Respiratory Panel by PCR     Status: None   Collection Time: 02/22/16  3:34 AM  Result Value Ref Range Status   Adenovirus NOT DETECTED NOT DETECTED Final   Coronavirus 229E NOT DETECTED NOT DETECTED Final   Coronavirus HKU1 NOT DETECTED NOT DETECTED Final   Coronavirus NL63 NOT DETECTED NOT DETECTED Final   Coronavirus OC43 NOT DETECTED NOT DETECTED Final   Metapneumovirus NOT DETECTED NOT DETECTED Final   Rhinovirus / Enterovirus NOT DETECTED NOT DETECTED Final   Influenza A NOT DETECTED NOT DETECTED Final   Influenza B NOT DETECTED NOT DETECTED Final   Parainfluenza Virus 1 NOT DETECTED NOT DETECTED Final   Parainfluenza Virus 2 NOT DETECTED NOT DETECTED  Final   Parainfluenza Virus 3 NOT DETECTED NOT DETECTED Final   Parainfluenza Virus 4 NOT DETECTED NOT DETECTED Final   Respiratory Syncytial Virus NOT DETECTED NOT DETECTED Final   Bordetella pertussis NOT DETECTED NOT DETECTED Final   Chlamydophila pneumoniae NOT DETECTED NOT DETECTED Final   Mycoplasma pneumoniae NOT DETECTED NOT DETECTED Final  Blood culture (routine x 2)  Status: None (Preliminary result)   Collection Time: 02/22/16  5:30 AM  Result Value Ref Range Status   Specimen Description BLOOD LEFT HAND  Final   Special Requests IN PEDIATRIC BOTTLE 1ML  Final   Culture NO GROWTH 2 DAYS  Final   Report Status PENDING  Incomplete  Blood culture (routine x 2)     Status: None (Preliminary result)   Collection Time: 02/22/16  5:35 AM  Result Value Ref Range Status   Specimen Description BLOOD RIGHT HAND  Final   Special Requests IN PEDIATRIC BOTTLE 1ML  Final   Culture NO GROWTH 2 DAYS  Final   Report Status PENDING  Incomplete  MRSA PCR Screening     Status: Abnormal   Collection Time: 02/23/16  6:03 PM  Result Value Ref Range Status   MRSA by PCR POSITIVE (A) NEGATIVE Final    Comment:        The GeneXpert MRSA Assay (FDA approved for NASAL specimens only), is one component of a comprehensive MRSA colonization surveillance program. It is not intended to diagnose MRSA infection nor to guide or monitor treatment for MRSA infections.      Scheduled Meds: . cefTRIAXone (ROCEPHIN)  IV  1 g Intravenous Q24H  . heparin  5,000 Units Subcutaneous Q8H  . insulin aspart  0-20 Units Subcutaneous TID WC  . insulin aspart  0-5 Units Subcutaneous QHS  . insulin glargine  22 Units Subcutaneous Q2200  . ipratropium-albuterol  3 mL Nebulization QID  . methylPREDNISolone (SOLU-MEDROL) injection  80 mg Intravenous Q12H  . nicotine  21 mg Transdermal Daily  . [START ON 02/25/2016] pantoprazole  40 mg Oral Daily  . polyethylene glycol  17 g Oral Daily  . senna-docusate  1 tablet  Oral BID  . sodium chloride  1,000 mL Intravenous Once  . vancomycin  1,250 mg Intravenous Q24H     LOS: 2 days   Cherene Altes, MD [facility] Office  [phone] Pager - Text Page per Amion as per below:  On-Call/Text Page:      [email or web address]      password [email or web address]  If 7PM-7AM, please contact night-coverage [email or web address] Password [email or web address] 02/24/2016, 3:58 PM

## 2016-02-25 ENCOUNTER — Other Ambulatory Visit: Payer: Self-pay | Admitting: Internal Medicine

## 2016-02-25 ENCOUNTER — Inpatient Hospital Stay (HOSPITAL_COMMUNITY): Payer: Medicare Other

## 2016-02-25 LAB — GLUCOSE, CAPILLARY
GLUCOSE-CAPILLARY: 141 mg/dL — AB (ref 65–99)
Glucose-Capillary: 104 mg/dL — ABNORMAL HIGH (ref 65–99)
Glucose-Capillary: 97 mg/dL (ref 65–99)
Glucose-Capillary: 100 mg/dL — ABNORMAL HIGH (ref 65–99)

## 2016-02-25 MED ORDER — ALPRAZOLAM 0.5 MG PO TABS
0.5000 mg | ORAL_TABLET | Freq: Every evening | ORAL | Status: DC | PRN
Start: 2016-02-25 — End: 2016-03-01
  Administered 2016-02-25 – 2016-02-29 (×5): 0.5 mg via ORAL
  Filled 2016-02-25 (×5): qty 1

## 2016-02-25 MED ORDER — PREDNISONE 20 MG PO TABS
40.0000 mg | ORAL_TABLET | Freq: Every day | ORAL | Status: DC
  Administered 2016-02-26 – 2016-02-28 (×3): 40 mg via ORAL
  Filled 2016-02-25 (×3): qty 2

## 2016-02-25 NOTE — Plan of Care (Signed)
Problem: Education: Goal: Knowledge of [facility] Education information/materials will improve Outcome: Not Progressing Patient states she does not know that she will be able to stop smoking.  Patient states " I do not want a Nicoderm patch.  How much good would that really do?"  Will continue to educate the importance of stopping to smoke.

## 2016-02-25 NOTE — Progress Notes (Signed)
[facility] TEAM 1 - Stepdown/ICU TEAM  Maurine Simmering  [id number] DOB: 1945-03-20 DOA: 02/22/2016 PCP: Binnie Rail, MD    Brief Narrative:  71 y.o. female w/ a hx of heart failure, chronic respiratory failure, COPD, high blood pressure, high cholesterol, and current tobacco abuse who presented to the hospital with shortness of breath 1 week and cough productive of yellow sputum.   In the ED she received 1 continuous neb treatment and IV Solu-Medrol. She was placed on BiPAP and given antibiotics.   Subjective: The patient is gradually improving.  She reports that she feels less short of breath today.  She denies chest pain.  Assessment & Plan:  Acute bronchospastic COPD exacerbation - O2 dependant COPD Followed by Rafael Bihari - goal sat will be 86-92% - cont maximal medical support - pt tells me she would not wish to be kept alive on a vent if it comes to that, but does wish for aggressive medical care short of that to include BIPAP - obtain CT chest to better define B LL infiltrates    Possible B LL Pneumonia v/s other   Negative for Flu or rep virus - CXR in Dec 2017 suggest presence of same pathology noted on admit CXR - ?if this is true infiltrate, or other process - cont empiric abx for now - obtain CT chest to evaluate further   Possible lymphoma of chest W/u of pulmonary nodule and LAD has included a PET scan (March 2017) noting intense hypermetabolic adenopathy worrisome for lymphoma - pt not felt to be a candidate by TCTS for bx/further w/u due to severe COPD - nodes did decrease in size on f/u CT July 0000000  Grade 1 diastolic CHF - Pulm HTN (A999333 Hg) Noted on TTE Feb 2017 - no evidence of severe volume overload on exam - is not on chronic diuretic tx - volume status stable Filed Weights   02/22/16 1433 02/23/16 0500 02/24/16 0354  Weight: 105.2 kg (231 lb 14.8 oz) 104 kg (229 lb 4.5 oz) 104 kg (229 lb 4.5 oz)    DM CBG improved with weaning of  steroids  Hypertension Blood pressure currently well controlled  Acute kidney injury Creatinine 1.19 at presentation - resolved   Recent Labs Lab 02/22/16 0353 02/23/16 0253  CREATININE 1.19* 0.93    GERD  Tobacco abuse Counseled on absolute need to discontinue all tobacco use  Obesity - Body mass index is 39.36 kg/m.   MRSA screen +  DVT prophylaxis: SCDs Code Status: DO NOT INTUBATE  Family Communication: no family present at time of exam  Disposition Plan: SDU  Consultants:  Pulmonary - pending   Procedures: None  Antimicrobials:  Ceftriaxone 1/31 > Vancomycin 2/1 > Azithromycin 1/31  Objective: Blood pressure (!) 109/51, pulse 100, temperature 98.2 F (36.8 C), temperature source Oral, resp. rate 17, height 5\' 4"  (1.626 m), weight 104 kg (229 lb 4.5 oz), SpO2 91 %.  Intake/Output Summary (Last 24 hours) at 02/25/16 1435 Last data filed at 02/25/16 1030  Gross per 24 hour  Intake               50 ml  Output              625 ml  Net             -575 ml   Filed Weights   02/22/16 1433 02/23/16 0500 02/24/16 0354  Weight: 105.2 kg (231 lb 14.8 oz) 104 kg (229 lb 4.5  oz) 104 kg (229 lb 4.5 oz)    Examination: General: no acute resp distress - respirations more comfortable today Lungs: Very poor air movement bilateral bases persist - no signif wheezing or crackles elsewhere  Cardiovascular: Mildly tachycardic - regular - no murmur Abdomen: Nontender, obese, soft, bowel sounds positive, no rebound, no ascites, no appreciable mass Extremities: No significant cyanosis or clubbing - trace edema bilateral lower extremities persists  CBC:  Recent Labs Lab 02/22/16 0334 02/23/16 0253  WBC 11.1* 14.3*  NEUTROABS 6.9  --   HGB 12.9 10.7*  HCT 47.0* 40.2  MCV 78.6 80.6  PLT 314 Q000111Q   Basic Metabolic Panel:  Recent Labs Lab 02/22/16 0353 02/22/16 0737 02/23/16 0253  NA 145  --  143  K 4.3  --  4.9  CL 106  --  105  CO2 31  --  28  GLUCOSE  125*  --  198*  BUN 22*  --  17  CREATININE 1.19*  --  0.93  CALCIUM 9.6  --  9.2  MG  --  2.2  --   PHOS  --  4.7*  --    GFR: Estimated Creatinine Clearance: 68 mL/min (by C-G formula based on SCr of 0.93 mg/dL).  Liver Function Tests: No results for input(s): AST, ALT, ALKPHOS, BILITOT, PROT, ALBUMIN in the last 168 hours. No results for input(s): LIPASE, AMYLASE in the last 168 hours. No results for input(s): AMMONIA in the last 168 hours.  Cardiac Enzymes:  Recent Labs Lab 02/22/16 0737  TROPONINI <0.03    HbA1C: Hemoglobin A1C  Date/Time Value Ref Range Status  07/07/2015 02:50 PM 10.9  Final   Hgb A1c MFr Bld  Date/Time Value Ref Range Status  10/31/2015 05:02 PM 7.7 (H) 4.6 - 6.5 % Final    Comment:    Glycemic Control Guidelines for People with Diabetes:Non Diabetic:  <6%Goal of Therapy: <7%Additional Action Suggested:  >8%   01/20/2015 03:15 PM 6.9 (H) 4.6 - 6.5 % Final    Comment:    Glycemic Control Guidelines for People with Diabetes:Non Diabetic:  <6%Goal of Therapy: <7%Additional Action Suggested:  >8%     CBG:  Recent Labs Lab 02/24/16 1304 02/24/16 1730 02/24/16 2220 02/25/16 0902 02/25/16 1247  GLUCAP 194* 148* 151* 141* 104*    Recent Results (from the past 240 hour(s))  Respiratory Panel by PCR     Status: None   Collection Time: 02/22/16  3:34 AM  Result Value Ref Range Status   Adenovirus NOT DETECTED NOT DETECTED Final   Coronavirus 229E NOT DETECTED NOT DETECTED Final   Coronavirus HKU1 NOT DETECTED NOT DETECTED Final   Coronavirus NL63 NOT DETECTED NOT DETECTED Final   Coronavirus OC43 NOT DETECTED NOT DETECTED Final   Metapneumovirus NOT DETECTED NOT DETECTED Final   Rhinovirus / Enterovirus NOT DETECTED NOT DETECTED Final   Influenza A NOT DETECTED NOT DETECTED Final   Influenza B NOT DETECTED NOT DETECTED Final   Parainfluenza Virus 1 NOT DETECTED NOT DETECTED Final   Parainfluenza Virus 2 NOT DETECTED NOT DETECTED Final    Parainfluenza Virus 3 NOT DETECTED NOT DETECTED Final   Parainfluenza Virus 4 NOT DETECTED NOT DETECTED Final   Respiratory Syncytial Virus NOT DETECTED NOT DETECTED Final   Bordetella pertussis NOT DETECTED NOT DETECTED Final   Chlamydophila pneumoniae NOT DETECTED NOT DETECTED Final   Mycoplasma pneumoniae NOT DETECTED NOT DETECTED Final  Blood culture (routine x 2)     Status: None (  Preliminary result)   Collection Time: 02/22/16  5:30 AM  Result Value Ref Range Status   Specimen Description BLOOD LEFT HAND  Final   Special Requests IN PEDIATRIC BOTTLE 1ML  Final   Culture NO GROWTH 3 DAYS  Final   Report Status PENDING  Incomplete  Blood culture (routine x 2)     Status: None (Preliminary result)   Collection Time: 02/22/16  5:35 AM  Result Value Ref Range Status   Specimen Description BLOOD RIGHT HAND  Final   Special Requests IN PEDIATRIC BOTTLE 1ML  Final   Culture NO GROWTH 3 DAYS  Final   Report Status PENDING  Incomplete  MRSA PCR Screening     Status: Abnormal   Collection Time: 02/23/16  6:03 PM  Result Value Ref Range Status   MRSA by PCR POSITIVE (A) NEGATIVE Final    Comment:        The GeneXpert MRSA Assay (FDA approved for NASAL specimens only), is one component of a comprehensive MRSA colonization surveillance program. It is not intended to diagnose MRSA infection nor to guide or monitor treatment for MRSA infections.      Scheduled Meds: . cefTRIAXone (ROCEPHIN)  IV  1 g Intravenous Q24H  . heparin  5,000 Units Subcutaneous Q8H  . insulin aspart  0-20 Units Subcutaneous TID WC  . insulin aspart  0-5 Units Subcutaneous QHS  . insulin glargine  22 Units Subcutaneous Q2200  . ipratropium-albuterol  3 mL Nebulization QID  . methylPREDNISolone (SOLU-MEDROL) injection  60 mg Intravenous Q24H  . nicotine  21 mg Transdermal Daily  . pantoprazole  40 mg Oral Daily  . polyethylene glycol  17 g Oral Daily  . senna-docusate  1 tablet Oral BID  . sodium chloride   1,000 mL Intravenous Once  . vancomycin  1,250 mg Intravenous Q24H     LOS: 3 days   Cherene Altes, MD [facility] Office  [phone] Pager - Text Page per Amion as per below:  On-Call/Text Page:      [email or web address]      password [email or web address]  If 7PM-7AM, please contact night-coverage [email or web address] Password [email or web address] 02/25/2016, 2:35 PM

## 2016-02-25 NOTE — Progress Notes (Signed)
RT placed patient on BIPAP because of desat in 70s. Patient is tolerating well at this time.

## 2016-02-25 NOTE — Progress Notes (Signed)
Pharmacy Antibiotic Note  [name] is a 71 y.o. female admitted on 02/22/2016 with pneumonia.  Pharmacy has been consulted for vancomycin dosing. Today is D#3 of ABX on CTX and vancomycin. Pt is afebrile, last CBC with elevated white count to 14.3. UOP remains stable but no BMET to assess renal function otherwise.   Plan: Vancomycin 1250mg  IV every 24 hours.  Goal trough 15-20 mcg/mL.  Vanc trough tomorrow at [facility] on Ceftriaxone 1g IV q24h  Monitor culture data, renal function and clinical course   Height: 5\' 4"  (162.6 cm) Weight: 229 lb 4.5 oz (104 kg) IBW/kg (Calculated) : 54.7  Temp (24hrs), Avg:98.2 F (36.8 C), Min:98 F (36.7 C), Max:98.3 F (36.8 C)   Recent Labs Lab 02/22/16 0334 02/22/16 0353 02/23/16 0253  WBC 11.1*  --  14.3*  CREATININE  --  1.19* 0.93    Estimated Creatinine Clearance: 68 mL/min (by C-G formula based on SCr of 0.93 mg/dL).    Allergies  Allergen Reactions  . Ambien [Zolpidem Tartrate] Other (See Comments)    Causes nightmares    ABX 2/2 vanc >> 2/2 CTX >> 2/2 azithro in ED X1 dose  Culture data 2/2 MRSA PCR positive 2/1 BCXr ngtd  Resp panel negaitve    Carlean Jews, Pharm.D. PGY1 Pharmacy Resident 2/4/20182:15 PM Pager [phone]

## 2016-02-26 DIAGNOSIS — I5032 Chronic diastolic (congestive) heart failure: Secondary | ICD-10-CM

## 2016-02-26 DIAGNOSIS — Z720 Tobacco use: Secondary | ICD-10-CM

## 2016-02-26 DIAGNOSIS — J9621 Acute and chronic respiratory failure with hypoxia: Secondary | ICD-10-CM

## 2016-02-26 DIAGNOSIS — J441 Chronic obstructive pulmonary disease with (acute) exacerbation: Principal | ICD-10-CM

## 2016-02-26 DIAGNOSIS — J9622 Acute and chronic respiratory failure with hypercapnia: Secondary | ICD-10-CM

## 2016-02-26 LAB — GLUCOSE, CAPILLARY
GLUCOSE-CAPILLARY: 130 mg/dL — AB (ref 65–99)
GLUCOSE-CAPILLARY: 153 mg/dL — AB (ref 65–99)
Glucose-Capillary: 83 mg/dL (ref 65–99)
Glucose-Capillary: 81 mg/dL (ref 65–99)

## 2016-02-26 LAB — COMPREHENSIVE METABOLIC PANEL
ALBUMIN: 3.1 g/dL — AB (ref 3.5–5.0)
ALT: 47 U/L (ref 14–54)
AST: 25 U/L (ref 15–41)
Alkaline Phosphatase: 51 U/L (ref 38–126)
Anion gap: 9 (ref 5–15)
BUN: 17 mg/dL (ref 6–20)
CHLORIDE: 96 mmol/L — AB (ref 101–111)
CO2: 34 mmol/L — ABNORMAL HIGH (ref 22–32)
CREATININE: 0.82 mg/dL (ref 0.44–1.00)
Calcium: 8.6 mg/dL — ABNORMAL LOW (ref 8.9–10.3)
GFR calc Af Amer: >60 mL/min (ref >60)
GLUCOSE: 89 mg/dL (ref 65–99)
Potassium: 3.4 mmol/L — ABNORMAL LOW (ref 3.5–5.1)
Sodium: 139 mmol/L (ref 135–145)
Total Bilirubin: 0.4 mg/dL (ref 0.3–1.2)
Total Protein: 5.5 g/dL — ABNORMAL LOW (ref 6.5–8.1)

## 2016-02-26 LAB — VANCOMYCIN, TROUGH: VANCOMYCIN TR: 7 ug/mL — AB (ref 15–20)

## 2016-02-26 MED ORDER — VANCOMYCIN HCL 10 G IV SOLR
1250.0000 mg | Freq: Two times a day (BID) | INTRAVENOUS | Status: DC
  Administered 2016-02-26 – 2016-02-28 (×4): 1250 mg via INTRAVENOUS
  Filled 2016-02-26 (×5): qty 1250

## 2016-02-26 MED ORDER — POTASSIUM CHLORIDE CRYS ER 20 MEQ PO TBCR
40.0000 meq | EXTENDED_RELEASE_TABLET | Freq: Once | ORAL | Status: AC
  Administered 2016-02-26: 40 meq via ORAL
  Filled 2016-02-26: qty 2

## 2016-02-26 MED ORDER — MUPIROCIN 2 % EX OINT
1.0000 "application " | TOPICAL_OINTMENT | Freq: Two times a day (BID) | CUTANEOUS | Status: DC
  Administered 2016-02-26 – 2016-03-01 (×9): 1 via NASAL
  Filled 2016-02-26 (×2): qty 22

## 2016-02-26 MED ORDER — CHLORHEXIDINE GLUCONATE CLOTH 2 % EX PADS
6.0000 | MEDICATED_PAD | Freq: Every day | CUTANEOUS | Status: AC
  Administered 2016-02-26 – 2016-03-01 (×5): 6 via TOPICAL

## 2016-02-26 NOTE — Consult Note (Signed)
Name: [name] MRN: [id number] DOB: [birth year]    ADMISSION DATE:  02/22/2016 CONSULTATION DATE:  2/5  REFERRING MD :  Dr. Thereasa Solo  CHIEF COMPLAINT:  SOB  BRIEF PATIENT DESCRIPTION: 71 year old female with O2 dependent COPD admitted 2/1 for hypercarbic/hypoxemic respiratory failure secondary to COPD exacerbation +/- PNA.  SIGNIFICANT EVENTS    STUDIES:  PFT - FEV-1 in 2008 was 63% with a diffusion capacity of 33% PFTs 07/2012 shows gold stage 3 copd with fev1 1.12L/49%, FVC 2.01L/70% - post bd parameters. No bd response, Ratio 60/77%. TLC 87%, DLCO 5/21% CT chest 2/4 > Small bilateral pleural effusions, right greater than left withpartial consolidations in the bilateral lower lobes and right middle lobes which may reflect pneumonia. Moderate severe emphysematous disease as before. Essentially stable mediastinal adenopathy compared with July 2017 CT.   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 She is also a current every day smoker, but is reportedly down to one cigarette per day. She is on home O2 at 4L and 5 with exertion, she is also essentially prednisone dependent. She was recently seen in pulmonary clinic (follows with Dr. [name]) for COPD exacerbation she was referred for hospital admission, but declined so she was ordered augmentin and prednisone taper. She states she really hasnt gotten much better since that time. She complains of cough off and on, non-productive, denies fever, chills, chest pain. Presented to ED 2/1 after being found by son unresponsive. In ED she was felt to be having a COPD exacerbation and was treated with nebulized bronchodilators, steroids, and BiPAP. She was admitted to the hospitalist team and has been progressing slowly on these therapies including antibiotics for a possible PNA. Due to slow improvement PCCM asked to see.   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  has a past surgical history that includes Total abdominal hysterectomy. Prior to Admission medications   Medication Sig Start Date End Date Taking? Authorizing Provider  albuterol (PROVENTIL) (2.5 MG/3ML) 0.083% nebulizer solution USE ONE VIAL IN NEBULIZER EVERY 6 HOURS AS NEEDED FOR WHEEZING 01/16/16  Yes Brand Males, MD  ALPRAZolam (XANAX) 0.5 MG tablet TAKE 1 TABLET BY MOUTH TWICE A DAY AS NEEDED FOR ANXIETY 11/20/15  Yes Binnie Rail, MD  budesonide-formoterol (SYMBICORT) 160-4.5 MCG/ACT inhaler Inhale 2 puffs into the lungs 2 (two) times daily. 11/06/15  Yes Brand Males, MD  cloNIDine (CATAPRES) 0.1 MG tablet Take 1 tablet (0.1 mg total) by mouth 2 (two) times daily. 03/20/15  Yes Binnie Rail, MD  famotidine (PEPCID) 20 MG tablet Take 1 tablet (20 mg total) by mouth at bedtime. 03/20/15  Yes Binnie Rail, MD  fluticasone (FLONASE) 50 MCG/ACT nasal spray Place 2 sprays into both nostrils daily as needed for allergies or rhinitis. 01/25/15  Yes Binnie Rail, MD  GLUCOCOM LANCETS 33G MISC Use with Test Strips to take blood sugars 07/20/15  Yes Binnie Rail, MD  glucose blood (BAYER CONTOUR NEXT TEST) test strip  Use to take blood sugars twice daily. 07/20/15  Yes Binnie Rail, MD  Insulin Glargine (LANTUS SOLOSTAR) 100 UNIT/ML Solostar Pen Inject 32 Units into the skin daily at 10 pm. 07/20/15  Yes Binnie Rail, MD  Insulin Pen Needle  (PEN NEEDLES) 32G X 4 MM MISC 15 Units by Other route at bedtime. -- use with insulin 07/07/15  Yes Binnie Rail, MD  losartan (COZAAR) 25 MG tablet Take 1 tablet (25 mg total) by mouth daily. 07/20/15  Yes Binnie Rail, MD  metoprolol tartrate (LOPRESSOR) 25 MG tablet Take 0.5 tablets (12.5 mg total) by mouth 2 (two) times daily. 03/20/15  Yes Binnie Rail, MD  omeprazole (PRILOSEC) 20 MG capsule Take 1 capsule (20 mg total) by mouth daily. 07/14/15  Yes Binnie Rail, MD  simvastatin (ZOCOR) 10 MG tablet Take 2 tablets (20 mg total) by mouth daily at 6 PM. 03/30/15  Yes Binnie Rail, MD  tiotropium (SPIRIVA HANDIHALER) 18 MCG inhalation capsule PLACE 1 CAPSULE INTO HANDIHALER AND INHALE EVERY DAY 02/16/15  Yes Noralee Space, MD  VENTOLIN HFA 108 (90 Base) MCG/ACT inhaler INHALE 2 PUFFS INTO THE LUNGS EVERY 6 HOURS AS NEEDED Patient taking differently: INHALE 2 PUFFS INTO THE LUNGS EVERY 6 HOURS AS NEEDED FOR SHORTNESS OF BREATH 12/26/15  Yes Brand Males, MD  predniSONE (DELTASONE) 20 MG tablet TAKE 1 TABLET BY MOUTH EVERY DAY WITH BREAKFAST 02/26/16   Brand Males, MD   Allergies  Allergen Reactions  . Ambien [Zolpidem Tartrate] Other (See Comments)    Causes nightmares    FAMILY HISTORY:  family history includes Esophageal cancer in her mother; Other in her father. SOCIAL HISTORY:  reports that she has been smoking Cigarettes.  She has a 22.00 pack-year smoking history. She has never used smokeless tobacco. She reports that she drinks alcohol. She reports that she does not use drugs.  REVIEW OF SYSTEMS:    Bolds are positive  Constitutional: weight loss, gain, night sweats, Fevers, chills, fatigue .  HEENT: headaches, Sore throat, sneezing, nasal congestion, post nasal drip, Difficulty swallowing, Tooth/dental problems, visual complaints visual changes, ear ache CV:  chest pain, radiates:,Orthopnea, PND, swelling in lower extremities, dizziness, palpitations, syncope.  GI  heartburn,  indigestion, abdominal pain, nausea, vomiting, diarrhea, change in bowel habits, loss of appetite, bloody stools.  Resp: cough, productive: , hemoptysis, dyspnea, chest pain, pleuritic.  Skin: rash or itching or icterus GU: dysuria, change in color of urine, urgency or frequency. flank pain, hematuria  MS: joint pain or swelling. decreased range of motion  Psych: change in mood or affect. depression or anxiety.  Neuro: difficulty with speech, weakness, numbness, ataxia   SUBJECTIVE:   VITAL SIGNS: Temp:  [97.6 F (36.4 C)-98.2 F (36.8 C)] 97.6 F (36.4 C) (02/05 1143) Pulse Rate:  [92-110] 110 (02/05 0800) Resp:  [16-24] 24 (02/05 0800) BP: (106-137)/(68-119) 137/119 (02/05 1143) SpO2:  [83 %-100 %] 93 % (02/05 1229) Weight:  [104.7 kg (230 lb 13.2 oz)] 104.7 kg (230 lb 13.2 oz) (02/05 0500)  PHYSICAL EXAMINATION: General:  Obese female mildly dyspneic at rest Neuro:  Alert, oriented, non-focal HEENT:  [place]/AT, PERRL, no JVD Cardiovascular:  RRR, no MRG Lungs:  Poor air movement throughout Abdomen:  Soft, non-tender Musculoskeletal:  No acute deformity or ROM limitation Skin:  Grossly intact   Recent Labs Lab 02/22/16 0353 02/23/16 0253 02/26/16 0217  NA 145 143 139  K 4.3 4.9 3.4*  CL 106 105  96*  CO2 31 28 34*  BUN 22* 17 17  CREATININE 1.19* 0.93 0.82  GLUCOSE 125* 198* 89    Recent Labs Lab 02/22/16 0334 02/23/16 0253  HGB 12.9 10.7*  HCT 47.0* 40.2  WBC 11.1* 14.3*  PLT 314 301   Ct Chest Wo Contrast  Result Date: 02/25/2016 CLINICAL DATA:  Pneumonia and shortness of breath EXAM: CT CHEST WITHOUT CONTRAST TECHNIQUE: Multidetector CT imaging of the chest was performed following the standard protocol without IV contrast. COMPARISON:  Chest x-ray 02/23/2016, CT chest 08/09/2015, PET-CT 04/03/2015, CT chest 03/21/2015 FINDINGS: Cardiovascular: Limited without intravenous contrast. Apparent origin of right subclavian artery with retroesophageal course.  Atherosclerotic calcifications of the aorta. Mild ectasia. No aneurysm. Coronary artery calcifications. Mild cardiomegaly. Small amount of pericardial effusion. Mediastinum/Nodes: Adenopathy is present within the mediastinum. AP window lymph node measures 1.2 cm compared with 1.2 cm previously. Lymph node posterior to the superior vena cava measures 1.3 cm compared with 1.5 cm previously. Small hilar nodes are grossly unchanged. Stable 8 mm hypodense nodule left lobe of thyroid. Trachea is midline. Esophagus demonstrates mild air distention but is otherwise unremarkable. Lungs/Pleura: Moderate severe emphysematous disease. Stable 5 mm pulmonary nodules within the right middle lobe an the right upper lobe. Small bilateral pleural effusions, right greater than left. Peripheral consolidations within the left greater than right lower lobes and mild consolidation in the right middle lobe. No pneumothorax. Upper Abdomen: No acute findings. Musculoskeletal: No acute or suspicious bone lesions. IMPRESSION: 1. Small bilateral pleural effusions, right greater than left with partial consolidations in the bilateral lower lobes and right middle lobes which may reflect pneumonia. 2. Moderate severe emphysematous disease as before. 3. Essentially stable mediastinal adenopathy compared with July 2017 CT. Electronically Signed   By: Donavan Foil M.D.   On: 02/25/2016 22:22    ASSESSMENT / PLAN:  Acute on chronic hypercarbic/hypoxemic respiratory failure COPD acute exacerbation (O2 dependent and on 20mg  prednisone daily) - Continue supplemental O2 as indicated to maintatin O2 sat 88-95% - Try high flow nasal cannula, may need oximyzer at home - PRN BiPAP for work of breathing - Continue prednisone taper to home dose of 20 mg daily until follow up in pulmonary office - Continue scheduled duoneb and PRN albuterol - Incentive spirometry  CAP - cultures and RVP negative - Continue ceftriaxone and azithromycin for 7 day  course  Chronic diastolic CHF - Per primary  Lung nodule/mediastinal lymphadenopathy - has been worked up in past, not candidate for biopsy to to severe lung disease, stable on CT this admission  Tobacco abuse - down to one cigarette per day - Counseled on cessation  Georgann Housekeeper, AGACNP-BC [facility] Pager [phone] or [phone]  02/26/2016 2:59 PM  Attending Note:  I have examined patient, reviewed labs, studies and notes. I have discussed the case with Jaclynn Guarneri, and I agree with the data and plans as amended above.   71 yo woman with COPD, chronic hypoxemic resp failure on 4-5L/min at home and on frequent / chronic prednisone, admitted with combined respiratory failure in setting of an apparent Ae that failed outpt abx and steroids burst. She tells me that she was "unconscious" when hospitalized. She responded to BiPAP, stabilized w empiric abx, steroids, BD's. She continues to require O2 6L/min. On eval she is obese, comfortable in chair with O2 in place, her breath sounds are very distant but I do not hear wheeze, trace LE edema.   Agree with continuing abx  full 7 days, continue scheduled BD's and steroids. Will ultimately wean back down to 20mg  daily. She may benefit from oximizer for home. May also want to discuss pulm rehab at some point. Unfortunately still smokes - discussed with her.    Toni Apo, MD, PhD 02/26/2016, 4:31 PM [facility] [phone] or if no answer [phone]

## 2016-02-26 NOTE — Progress Notes (Signed)
[facility] TEAM 1 - Stepdown/ICU TEAM  Maurine Simmering  [id number] DOB: 10-14-1945 DOA: 02/22/2016 PCP: Binnie Rail, MD    Brief Narrative:  71 y.o. female w/ a hx of heart failure, chronic respiratory failure, COPD, high blood pressure, high cholesterol, and current tobacco abuse who presented to the hospital with shortness of breath 1 week and cough productive of yellow sputum.   In the ED she received 1 continuous neb treatment and IV Solu-Medrol. She was placed on BiPAP and given antibiotics.   Subjective: The patient feels that her breathing continues to slowly improve.  She is sitting up in a bedside chair and looks much better today.  She denies chest pain nausea vomiting or abdominal pain.  Assessment & Plan:  Acute bronchospastic COPD exacerbation - O2 dependant COPD Followed by Rafael Bihari - goal sat will be 86-92% - cont maximal medical support - pt tells me she would not wish to be kept alive on a vent if it comes to that, but does wish for aggressive medical care short of that to include BIPAP - CT chest essentially w/o signif change since last CT - Pulm to see in consultation today    Possible B LL Pneumonia v/s other chronic B LL process Negative for Flu or rep virus - cont empiric abx to complete a seven-day course  Possible lymphoma of chest W/u of pulmonary nodule and LAD included a PET scan (March 2017) noting intense hypermetabolic adenopathy worrisome for lymphoma - pt not felt to be a candidate by TCTS for bx/further w/u due to severe COPD - nodes did decrease in size on f/u CT July 2017 and remain stable on CT this admit   Grade 1 diastolic CHF - Pulm HTN ([id number] Hg) Noted on TTE Feb 2017 - no evidence of severe volume overload on exam - is not on chronic diuretic tx - volume status stable Filed Weights   02/23/16 0500 02/24/16 0354 02/26/16 0500  Weight: 104 kg (229 lb 4.5 oz) 104 kg (229 lb 4.5 oz) 104.7 kg (230 lb 13.2 oz)    DM CBG presently well  controlled  Hypertension No change in treatment plan today  Acute kidney injury Creatinine 1.19 at presentation - resolved   Recent Labs Lab 02/22/16 0353 02/23/16 0253 02/26/16 0217  CREATININE 1.19* 0.93 0.82    GERD  Tobacco abuse Counseled on absolute need to discontinue all tobacco use  Obesity - Body mass index is 39.62 kg/m.   MRSA screen +  DVT prophylaxis: SCDs Code Status: DO NOT INTUBATE  Family Communication: no family present at time of exam  Disposition Plan: SDU until O2 requirement further improves  Consultants:  Pulmonary    Procedures: None  Antimicrobials:  Ceftriaxone 1/31 > Vancomycin 2/1 > Azithromycin 1/31  Objective: Blood pressure (!) 137/119, pulse (!) 110, temperature 97.6 F (36.4 C), temperature source Oral, resp. rate (!) 24, height 5\' 4"  (1.626 m), weight 104.7 kg (230 lb 13.2 oz), SpO2 93 %.  Intake/Output Summary (Last 24 hours) at 02/26/16 1537 Last data filed at 02/26/16 0615  Gross per 24 hour  Intake              800 ml  Output             1651 ml  Net             -851 ml   Filed Weights   02/23/16 0500 02/24/16 0354 02/26/16 0500  Weight: 104 kg (229 lb  4.5 oz) 104 kg (229 lb 4.5 oz) 104.7 kg (230 lb 13.2 oz)    Examination: General: no acute resp distress - able to converse without difficulty Lungs: poor air movement bilateral bases - no signif wheezing or crackles elsewhere  Cardiovascular: Mild tachycardia - regular - no murmur Abdomen: Nontender, obese, soft, bowel sounds positive, no rebound Extremities: No significant cyanosis or clubbing - trace edema bilateral lower extremities persists  CBC:  Recent Labs Lab 02/22/16 0334 02/23/16 0253  WBC 11.1* 14.3*  NEUTROABS 6.9  --   HGB 12.9 10.7*  HCT 47.0* 40.2  MCV 78.6 80.6  PLT 314 [id number]   Basic Metabolic Panel:  Recent Labs Lab 02/22/16 0353 02/22/16 0737 02/23/16 0253 02/26/16 0217  NA 145  --  143 139  K 4.3  --  4.9 3.4*  CL 106  --   105 96*  CO2 31  --  28 34*  GLUCOSE 125*  --  198* 89  BUN 22*  --  17 17  CREATININE 1.19*  --  0.93 0.82  CALCIUM 9.6  --  9.2 8.6*  MG  --  2.2  --   --   PHOS  --  4.7*  --   --    GFR: Estimated Creatinine Clearance: 77.4 mL/min (by C-G formula based on SCr of 0.82 mg/dL).  Liver Function Tests:  Recent Labs Lab 02/26/16 0217  AST 25  ALT 47  ALKPHOS 51  BILITOT 0.4  PROT 5.5*  ALBUMIN 3.1*    Cardiac Enzymes:  Recent Labs Lab 02/22/16 0737  TROPONINI <0.03    HbA1C: Hemoglobin A1C  Date/Time Value Ref Range Status  07/07/2015 02:50 PM 10.9  Final   Hgb A1c MFr Bld  Date/Time Value Ref Range Status  10/31/2015 05:02 PM 7.7 (H) 4.6 - 6.5 % Final    Comment:    Glycemic Control Guidelines for People with Diabetes:Non Diabetic:  <6%Goal of Therapy: <7%Additional Action Suggested:  >8%   01/20/2015 03:15 PM 6.9 (H) 4.6 - 6.5 % Final    Comment:    Glycemic Control Guidelines for People with Diabetes:Non Diabetic:  <6%Goal of Therapy: <7%Additional Action Suggested:  >8%     CBG:  Recent Labs Lab 02/25/16 1247 02/25/16 1702 02/25/16 2208 02/26/16 0855 02/26/16 1141  GLUCAP 104* 97 100* 83 130*    Recent Results (from the past 240 hour(s))  Respiratory Panel by PCR     Status: None   Collection Time: 02/22/16  3:34 AM  Result Value Ref Range Status   Adenovirus NOT DETECTED NOT DETECTED Final   Coronavirus 229E NOT DETECTED NOT DETECTED Final   Coronavirus HKU1 NOT DETECTED NOT DETECTED Final   Coronavirus NL63 NOT DETECTED NOT DETECTED Final   Coronavirus OC43 NOT DETECTED NOT DETECTED Final   Metapneumovirus NOT DETECTED NOT DETECTED Final   Rhinovirus / Enterovirus NOT DETECTED NOT DETECTED Final   Influenza A NOT DETECTED NOT DETECTED Final   Influenza B NOT DETECTED NOT DETECTED Final   Parainfluenza Virus 1 NOT DETECTED NOT DETECTED Final   Parainfluenza Virus 2 NOT DETECTED NOT DETECTED Final   Parainfluenza Virus 3 NOT DETECTED NOT  DETECTED Final   Parainfluenza Virus 4 NOT DETECTED NOT DETECTED Final   Respiratory Syncytial Virus NOT DETECTED NOT DETECTED Final   Bordetella pertussis NOT DETECTED NOT DETECTED Final   Chlamydophila pneumoniae NOT DETECTED NOT DETECTED Final   Mycoplasma pneumoniae NOT DETECTED NOT DETECTED Final  Blood culture (routine x  2)     Status: None (Preliminary result)   Collection Time: 02/22/16  5:30 AM  Result Value Ref Range Status   Specimen Description BLOOD LEFT HAND  Final   Special Requests IN PEDIATRIC BOTTLE 1ML  Final   Culture NO GROWTH 3 DAYS  Final   Report Status PENDING  Incomplete  Blood culture (routine x 2)     Status: None (Preliminary result)   Collection Time: 02/22/16  5:35 AM  Result Value Ref Range Status   Specimen Description BLOOD RIGHT HAND  Final   Special Requests IN PEDIATRIC BOTTLE 1ML  Final   Culture NO GROWTH 3 DAYS  Final   Report Status PENDING  Incomplete  MRSA PCR Screening     Status: Abnormal   Collection Time: 02/23/16  6:03 PM  Result Value Ref Range Status   MRSA by PCR POSITIVE (A) NEGATIVE Final    Comment:        The GeneXpert MRSA Assay (FDA approved for NASAL specimens only), is one component of a comprehensive MRSA colonization surveillance program. It is not intended to diagnose MRSA infection nor to guide or monitor treatment for MRSA infections.      Scheduled Meds: . cefTRIAXone (ROCEPHIN)  IV  1 g Intravenous Q24H  . Chlorhexidine Gluconate Cloth  6 each Topical Q0600  . heparin  5,000 Units Subcutaneous Q8H  . insulin aspart  0-20 Units Subcutaneous TID WC  . insulin aspart  0-5 Units Subcutaneous QHS  . insulin glargine  22 Units Subcutaneous Q2200  . ipratropium-albuterol  3 mL Nebulization QID  . mupirocin ointment  1 application Nasal BID  . nicotine  21 mg Transdermal Daily  . pantoprazole  40 mg Oral Daily  . polyethylene glycol  17 g Oral Daily  . predniSONE  40 mg Oral Q breakfast  . senna-docusate  1  tablet Oral BID  . vancomycin  1,250 mg Intravenous Q12H     LOS: 4 days   Cherene Altes, MD [facility] Office  [phone] Pager - Text Page per Shea Evans as per below:  On-Call/Text Page:      [email or web address]      password [email or web address]  If 7PM-7AM, please contact night-coverage [email or web address] Password [facility] 02/26/2016, 3:37 PM

## 2016-02-26 NOTE — Progress Notes (Signed)
Pharmacy Antibiotic Note [name] is a 71 y.o. female admitted on 02/22/2016 with pneumonia.  Today is D#4 of Ceftriaxone and vancomycin for treatment. Pt is afebrile, last CBC with elevated white count to 14.3.   Vancomycin trough this am is SUBtherapeutic at 7. SCr remains stable.  Plan: 1. Increase vancomycin to 1250 mg IV every 12 hours 2. If remains on vancomycin long enough will [facility]; goal trough 15-20 3. Continues on Ceftriaxone 1g IV q24h  4. Monitor culture data, renal function and clinical course   Height: 5\' 4"  (162.6 cm) Weight: 230 lb 13.2 oz (104.7 kg) IBW/kg (Calculated) : 54.7  Temp (24hrs), Avg:98.1 F (36.7 C), Min:97.8 F (36.6 C), Max:98.2 F (36.8 C)   Recent Labs Lab 02/22/16 0334 02/22/16 0353 02/23/16 0253 02/26/16 0217 02/26/16 0749  WBC 11.1*  --  14.3*  --   --   CREATININE  --  1.19* 0.93 0.82  --   VANCOTROUGH  --   --   --   --  7*    Estimated Creatinine Clearance: 77.4 mL/min (by C-G formula based on SCr of 0.82 mg/dL).    Allergies  Allergen Reactions  . Ambien [Zolpidem Tartrate] Other (See Comments)    Causes nightmares    ABX  2/2 vancomycin >>  2/2 Ceftriaxone >> 2/2 azithro in ED X1 dose  Culture data 2/2 MRSA PCR positive 2/1 BCXr ngtd  Resp panel negaitve    Vincenza Hews, PharmD, BCPS 02/26/2016, 10:07 AM

## 2016-02-27 DIAGNOSIS — I10 Essential (primary) hypertension: Secondary | ICD-10-CM

## 2016-02-27 DIAGNOSIS — E1165 Type 2 diabetes mellitus with hyperglycemia: Secondary | ICD-10-CM

## 2016-02-27 DIAGNOSIS — I5032 Chronic diastolic (congestive) heart failure: Secondary | ICD-10-CM

## 2016-02-27 DIAGNOSIS — F411 Generalized anxiety disorder: Secondary | ICD-10-CM

## 2016-02-27 DIAGNOSIS — IMO0002 Reserved for concepts with insufficient information to code with codable children: Secondary | ICD-10-CM

## 2016-02-27 DIAGNOSIS — E118 Type 2 diabetes mellitus with unspecified complications: Secondary | ICD-10-CM

## 2016-02-27 DIAGNOSIS — I272 Pulmonary hypertension, unspecified: Secondary | ICD-10-CM

## 2016-02-27 LAB — GLUCOSE, CAPILLARY
GLUCOSE-CAPILLARY: 101 mg/dL — AB (ref 65–99)
GLUCOSE-CAPILLARY: 156 mg/dL — AB (ref 65–99)
GLUCOSE-CAPILLARY: 160 mg/dL — AB (ref 65–99)
Glucose-Capillary: 139 mg/dL — ABNORMAL HIGH (ref 65–99)
Glucose-Capillary: 104 mg/dL — ABNORMAL HIGH (ref 65–99)

## 2016-02-27 LAB — BASIC METABOLIC PANEL
Anion gap: 10 (ref 5–15)
BUN: 14 mg/dL (ref 6–20)
CHLORIDE: 99 mmol/L — AB (ref 101–111)
CO2: 31 mmol/L (ref 22–32)
Calcium: 9 mg/dL (ref 8.9–10.3)
Creatinine, Ser: 0.79 mg/dL (ref 0.44–1.00)
GFR calc Af Amer: >60 mL/min (ref >60)
GFR calc non Af Amer: >60 mL/min (ref >60)
Glucose, Bld: 129 mg/dL — ABNORMAL HIGH (ref 65–99)
POTASSIUM: 3.6 mmol/L (ref 3.5–5.1)
SODIUM: 140 mmol/L (ref 135–145)

## 2016-02-27 LAB — CULTURE, BLOOD (ROUTINE X 2)
CULTURE: NO GROWTH
Culture: NO GROWTH

## 2016-02-27 LAB — MAGNESIUM: Magnesium: 2.2 mg/dL (ref 1.7–2.4)

## 2016-02-27 MED ORDER — FUROSEMIDE 10 MG/ML IJ SOLN
40.0000 mg | Freq: Two times a day (BID) | INTRAMUSCULAR | Status: DC
  Administered 2016-02-27 – 2016-02-28 (×2): 40 mg via INTRAVENOUS
  Filled 2016-02-27 (×2): qty 4

## 2016-02-27 NOTE — Evaluation (Signed)
Occupational Therapy Evaluation Patient Details Name: [name] MRN: [id number] DOB: 07-20-1945 Today's Date: 02/27/2016    History of Present Illness 71 y.o. female w/ a hx of heart failure, chronic respiratory failure, COPD, high blood pressure, high cholesterol, and current tobacco abuse who presented to the hospital with shortness of breath 1 week and cough productive of yellow sputum. Acute bronchospastic COPD exacerbation - O2 dependant COPD, PNA   Clinical Impression   Pt performing ADL and mobility at a set up to supervision level with RW. Pt on 10 L 02 with sats maintaining above 88% with mobility. Pt typically uses 4-5L 02 depending on exertion. Will follow acutely. Anticipate pt will be able to return home at discharge.     Follow Up Recommendations  No OT follow up;Supervision - Intermittent    Equipment Recommendations  None recommended by OT    Recommendations for Other Services       Precautions / Restrictions Precautions Precaution Comments: watch HR and O2 saturations       Mobility Bed Mobility               General bed mobility comments: sitting EOB upon arrival  Transfers Overall transfer level: Needs assistance Equipment used: Rolling walker (2 wheeled) Transfers: Sit to/from Stand Sit to Stand: Supervision         General transfer comment: VCs for hand placement, supervision for safety and line management    Balance Overall balance assessment: Needs assistance   Sitting balance-Leahy Scale: Good       Standing balance-Leahy Scale: Fair Standing balance comment: able to static stand at sink without UE support                            ADL Overall ADL's : Needs assistance/impaired Eating/Feeding: Independent;Sitting   Grooming: Wash/dry hands;Standing;Supervision/safety   Upper Body Bathing: Set up;Sitting   Lower Body Bathing: Supervison/ safety;Sit to/from stand   Upper Body Dressing : Set up;Sitting    Lower Body Dressing: Supervision/safety;Sit to/from stand   Toilet Transfer: Supervision/safety;Ambulation;BSC;RW   Toileting- Clothing Manipulation and Hygiene: Supervision/safety;Sit to/from Nurse, children's Details (indicate cue type and reason): pt does not shower, gets SOB Functional mobility during ADLs: Supervision/safety;Rolling walker (cues for hand placement) General ADL Comments: Pt recently completed HHOT.     Vision     Perception     Praxis      Pertinent Vitals/Pain Pain Assessment: No/denies pain     Hand Dominance Right   Extremity/Trunk Assessment Upper Extremity Assessment Upper Extremity Assessment: Overall WFL for tasks assessed   Lower Extremity Assessment Lower Extremity Assessment: Defer to PT evaluation       Communication Communication Communication: No difficulties   Cognition Arousal/Alertness: Awake/alert Behavior During Therapy: WFL for tasks assessed/performed Overall Cognitive Status: Within Functional Limits for tasks assessed                     General Comments       Exercises       Shoulder Instructions      Home Living Family/patient expects to be discharged to:: Private residence Living Arrangements: Children (son) Available Help at Discharge: Family;Available PRN/intermittently Type of Home: Apartment Home Access: Stairs to enter Entrance Stairs-Number of Steps: 2 Entrance Stairs-Rails: None Home Layout: One level     Bathroom Shower/Tub: Teacher, early years/pre: Standard     Home Equipment: Environmental consultant -  4 wheels;Bedside commode (02)          Prior Functioning/Environment Level of Independence: Independent with assistive device(s)        Comments: o2 dependent at baseline 4 liters at rest 5 with exertion, uses rollator in community        OT Problem List: Decreased activity tolerance;Impaired balance (sitting and/or standing);Decreased safety awareness;Obesity   OT  Treatment/Interventions: Self-care/ADL training;DME and/or AE instruction;Balance training;Patient/family education;Therapeutic activities;Energy conservation    OT Goals(Current goals can be found in the care plan section) Acute Rehab OT Goals Patient Stated Goal: to go home OT Goal Formulation: With patient Time For Goal Achievement: 03/12/16 Potential to Achieve Goals: Good ADL Goals Pt Will Perform Grooming: with modified independence;standing Pt Will Perform Lower Body Bathing: with modified independence;sit to/from stand Pt Will Perform Lower Body Dressing: with modified independence;sit to/from stand Pt Will Transfer to Toilet: with modified independence;ambulating;regular height toilet Pt Will Perform Toileting - Clothing Manipulation and hygiene: with modified independence;sit to/from stand Additional ADL Goal #1: Pt will utilize energy conservation strategies during ADL and mobility independently.  OT Frequency: Min 2X/week   Barriers to D/C:            Co-evaluation PT/OT/SLP Co-Evaluation/Treatment: Yes Reason for Co-Treatment: For patient/therapist safety   OT goals addressed during session: ADL's and self-care      End of Session Equipment Utilized During Treatment: Gait belt;Rolling walker;Oxygen  Activity Tolerance: Patient limited by fatigue Patient left: in chair;with call bell/phone within reach;with chair alarm set   Time: [id number] OT Time Calculation (min): 32 min Charges:  OT General Charges $OT Visit: 1 Procedure OT Evaluation $OT Eval Moderate Complexity: 1 Procedure G-Codes:    Malka So 02/27/2016, 12:42 PM [phone]

## 2016-02-27 NOTE — Progress Notes (Signed)
Found pt on 13L HFNC.

## 2016-02-27 NOTE — Progress Notes (Signed)
RT changed the pulse ox. RT weaned FIO2 to 5L HFNC due to stable sats. Pt tol well. Pt in no distress

## 2016-02-27 NOTE — Progress Notes (Signed)
PROGRESS NOTE    [name]  [id number] DOB: [birth year] DOA: 02/22/2016 PCP: Binnie Rail, MD   Brief Narrative:  71 y.o.BF PMHx Generalized anxiety disorder, Chronic Diastolic CHF, Pulmonary hypertension, chronic respiratory failure (on home 2 L O2 with activity/sleep), COPD, pulmonary nodule,Mass of mediastinum, HTN, HLD, current Tobacco Abuse, medical noncompliance  Presented to the hospital with shortness of breath 1 week and cough productive of yellow sputum.   In the ED she received 1 continuous neb treatment and IV Solu-Medrol. She was placed on BiPAP and given antibiotics.    Subjective: 2/6 A/O 4, difficulty following conversation (can speak in 5 word sentences) secondary to acute on chronic respiratory failure with hypoxia. States is now on 4 L O2 resting, and 5 L O2 with exertion (currently on 5 L O2 with SPO2~92%). States understanding she has heart failure but does not see cardiologist. Base weight~225 pounds (102 kg). Continues to smoke   Assessment & Plan:   Principal Problem:   Acute on chronic respiratory failure with hypoxia and hypercapnia (HCC) Active Problems:   Hyperlipidemia   Essential hypertension   Tobacco abuse   Chronic diastolic heart failure (HCC)   Generalized anxiety disorder   Diabetes (HCC)   COPD exacerbation (HCC)   Acute bronchospastic COPD exacerbation - O2 dependant COPD Followed by Rafael Bihari - goal sat will be 86-92% - cont maximal medical support - pt tells me she would not wish to be kept alive on a vent if it comes to that, but does wish for aggressive medical care short of that to include BIPAP - CT chest essentially w/o signif change since last CT - Pulm to see in consultation today    Possible B LL Pneumonia v/s other chronic B LL process Negative for Flu or rep virus - cont empiric abx to complete a seven-day course  Possible lymphoma of chest -W/u of pulmonary nodule and LAD included a PET scan (March 2017) noting  intense hypermetabolic adenopathy worrisome for lymphoma  - pt not felt to be a candidate by TCTS for bx/further w/u due to severe COPD  - nodes did decrease in size on f/u CT July 2017 and remain stable on CT this admit   Grade 1 diastolic CHF - Pulm HTN ([id number] Hg) Noted on TTE Feb 2017 - no evidence of severe volume overload on exam - is not on chronic diuretic tx - volume status stable -Strict in and out since admission -2.5 L -Daily weight Filed Weights   02/24/16 0354 02/26/16 0500 02/27/16 0400  Weight: 104 kg (229 lb 4.5 oz) 104.7 kg (230 lb 13.2 oz) 103.7 kg (228 lb 9.9 oz)  -Lasix 40 mg BID  DMType II uncontrolled with complications -[id number] Hemoglobin A1c= 7.7  -Lantus 22 units daily -Resistant SSI   Hypertension No change in treatment plan today  Acute kidney injury Creatinine 1.19 at presentation - resolved  Lab Results  Component Value Date   CREATININE 0.79 02/27/2016   CREATININE 0.82 02/26/2016   CREATININE 0.93 02/23/2016   GERD  Tobacco abuse Counseled on absolute need to discontinue all tobacco use  Obesity - Body mass index is 39.62 kg/m.   MRSA screen +  Goals of care -2/6 PT/OT consult placed: Worsening O2 dependent COPD, uncontrolled DM evaluate for CIR vs SNF   DVT prophylaxis: Subcutaneous heparin Code Status: Partial Family Communication: None Disposition Plan: SNF?   Consultants:  [facility]  Procedures/Significant Events:     VENTILATOR SETTINGS: BiPAP  Cultures   Antimicrobials: Anti-infectives    Start     Stop   02/26/16 2100  vancomycin (VANCOCIN) 1,250 mg in sodium chloride 0.9 % 250 mL IVPB     03/01/16 0859   02/23/16 0800  vancomycin (VANCOCIN) 1,250 mg in sodium chloride 0.9 % 250 mL IVPB  Status:  Discontinued     02/26/16 1004   02/23/16 0500  cefTRIAXone (ROCEPHIN) 1 g in dextrose 5 % 50 mL IVPB     03/01/16 0459   02/22/16 0815  vancomycin (VANCOCIN) 2,000 mg in sodium chloride 0.9 % 500 mL IVPB      02/22/16 1138   02/22/16 0515  cefTRIAXone (ROCEPHIN) 1 g in dextrose 5 % 50 mL IVPB     02/22/16 0636   02/22/16 0515  azithromycin (ZITHROMAX) 500 mg in dextrose 5 % 250 mL IVPB     02/22/16 0710       Devices    LINES / TUBES:      Continuous Infusions:   Objective: Vitals:   02/26/16 2159 02/26/16 2344 02/27/16 0400 02/27/16 0737  BP:  127/60 122/65 118/73  Pulse:  (!) 105 96 99  Resp:  19 19 (!) 21  Temp:  98.5 F (36.9 C) 97.9 F (36.6 C) 97.9 F (36.6 C)  TempSrc:  Oral Oral Oral  SpO2: 94% 100% 93% 92%  Weight:   103.7 kg (228 lb 9.9 oz)   Height:        Intake/Output Summary (Last 24 hours) at 02/27/16 [id number] Last data filed at 02/27/16 0744  Gross per 24 hour  Intake              540 ml  Output              550 ml  Net              -10 ml   Filed Weights   02/24/16 0354 02/26/16 0500 02/27/16 0400  Weight: 104 kg (229 lb 4.5 oz) 104.7 kg (230 lb 13.2 oz) 103.7 kg (228 lb 9.9 oz)    Examination:  General: A/O 4, positive acute on chronic respiratory distress Eyes: negative scleral hemorrhage, negative anisocoria, negative icterus ENT: Negative Runny nose, negative gingival bleeding, Neck:  Negative scars, masses, torticollis, lymphadenopathy, JVD Lungs: tachypnea little to no air movement in all lung fields, negative wheezes or crackles Cardiovascular: Tachycardic, Regular  rhythm without murmur gallop or rub normal S1 and S2 Abdomen: negative abdominal pain, nondistended, positive soft, bowel sounds, no rebound, no ascites, no appreciable mass Extremities: No significant cyanosis, clubbing, or edema bilateral lower extremities Skin: Negative rashes, lesions, ulcers Psychiatric:  Extremely poor understanding of her medical conditions.  Central nervous system:  Cranial nerves II through XII intact, tongue/uvula midline, all extremities muscle strength 5/5, sensation intact throughout,  negative dysarthria, negative expressive aphasia, negative  receptive aphasia.  .     Data Reviewed: Care during the described time interval was provided by me .  I have reviewed this patient's available data, including medical history, events of note, physical examination, and all test results as part of my evaluation. I have personally reviewed and interpreted all radiology studies.  CBC:  Recent Labs Lab 02/22/16 0334 02/23/16 0253  WBC 11.1* 14.3*  NEUTROABS 6.9  --   HGB 12.9 10.7*  HCT 47.0* 40.2  MCV 78.6 80.6  PLT 314 [id number]   Basic Metabolic Panel:  Recent Labs Lab 02/22/16 0353 02/22/16 0737 02/23/16 0253 02/26/16 0217  02/27/16 0302  NA 145  --  143 139 140  K 4.3  --  4.9 3.4* 3.6  CL 106  --  105 96* 99*  CO2 31  --  28 34* 31  GLUCOSE 125*  --  198* 89 129*  BUN 22*  --  17 17 14   CREATININE 1.19*  --  0.93 0.82 0.79  CALCIUM 9.6  --  9.2 8.6* 9.0  MG  --  2.2  --   --  2.2  PHOS  --  4.7*  --   --   --    GFR: Estimated Creatinine Clearance: 78.9 mL/min (by C-G formula based on SCr of 0.79 mg/dL). Liver Function Tests:  Recent Labs Lab 02/26/16 0217  AST 25  ALT 47  ALKPHOS 51  BILITOT 0.4  PROT 5.5*  ALBUMIN 3.1*   No results for input(s): LIPASE, AMYLASE in the last 168 hours. No results for input(s): AMMONIA in the last 168 hours. Coagulation Profile: No results for input(s): INR, PROTIME in the last 168 hours. Cardiac Enzymes:  Recent Labs Lab 02/22/16 0737  TROPONINI <0.03   BNP (last 3 results)  Recent Labs  03/10/15 1449  PROBNP 16.0   HbA1C: No results for input(s): HGBA1C in the last 72 hours. CBG:  Recent Labs Lab 02/26/16 1141 02/26/16 1704 02/26/16 2209 02/27/16 0211 02/27/16 0735  GLUCAP 130* 153* 81 139* 101*   Lipid Profile: No results for input(s): CHOL, HDL, LDLCALC, TRIG, CHOLHDL, LDLDIRECT in the last 72 hours. Thyroid Function Tests: No results for input(s): TSH, T4TOTAL, FREET4, T3FREE, THYROIDAB in the last 72 hours. Anemia Panel: No results for  input(s): VITAMINB12, FOLATE, FERRITIN, TIBC, IRON, RETICCTPCT in the last 72 hours. Urine analysis:    Component Value Date/Time   COLORURINE YELLOW 07/04/2015 1848   APPEARANCEUR CLOUDY (A) 07/04/2015 1848   LABSPEC 1.040 (H) 07/04/2015 1848   PHURINE 6.0 07/04/2015 1848   GLUCOSEU >1000 (A) 07/04/2015 1848   GLUCOSEU NEGATIVE 04/09/2006 1315   HGBUR NEGATIVE 07/04/2015 1848   BILIRUBINUR NEGATIVE 07/04/2015 1848   KETONESUR NEGATIVE 07/04/2015 1848   PROTEINUR NEGATIVE 07/04/2015 1848   UROBILINOGEN 1.0 04/04/2010 2323   NITRITE NEGATIVE 07/04/2015 1848   LEUKOCYTESUR NEGATIVE 07/04/2015 1848   Sepsis Labs: @LABRCNTIP (procalcitonin:4,lacticidven:4)  ) Recent Results (from the past 240 hour(s))  Respiratory Panel by PCR     Status: None   Collection Time: 02/22/16  3:34 AM  Result Value Ref Range Status   Adenovirus NOT DETECTED NOT DETECTED Final   Coronavirus 229E NOT DETECTED NOT DETECTED Final   Coronavirus HKU1 NOT DETECTED NOT DETECTED Final   Coronavirus NL63 NOT DETECTED NOT DETECTED Final   Coronavirus OC43 NOT DETECTED NOT DETECTED Final   Metapneumovirus NOT DETECTED NOT DETECTED Final   Rhinovirus / Enterovirus NOT DETECTED NOT DETECTED Final   Influenza A NOT DETECTED NOT DETECTED Final   Influenza B NOT DETECTED NOT DETECTED Final   Parainfluenza Virus 1 NOT DETECTED NOT DETECTED Final   Parainfluenza Virus 2 NOT DETECTED NOT DETECTED Final   Parainfluenza Virus 3 NOT DETECTED NOT DETECTED Final   Parainfluenza Virus 4 NOT DETECTED NOT DETECTED Final   Respiratory Syncytial Virus NOT DETECTED NOT DETECTED Final   Bordetella pertussis NOT DETECTED NOT DETECTED Final   Chlamydophila pneumoniae NOT DETECTED NOT DETECTED Final   Mycoplasma pneumoniae NOT DETECTED NOT DETECTED Final  Blood culture (routine x 2)     Status: None (Preliminary result)   Collection Time:  02/22/16  5:30 AM  Result Value Ref Range Status   Specimen Description BLOOD LEFT HAND  Final    Special Requests IN PEDIATRIC BOTTLE 1ML  Final   Culture NO GROWTH 4 DAYS  Final   Report Status PENDING  Incomplete  Blood culture (routine x 2)     Status: None (Preliminary result)   Collection Time: 02/22/16  5:35 AM  Result Value Ref Range Status   Specimen Description BLOOD RIGHT HAND  Final   Special Requests IN PEDIATRIC BOTTLE 1ML  Final   Culture NO GROWTH 4 DAYS  Final   Report Status PENDING  Incomplete  MRSA PCR Screening     Status: Abnormal   Collection Time: 02/23/16  6:03 PM  Result Value Ref Range Status   MRSA by PCR POSITIVE (A) NEGATIVE Final    Comment:        The GeneXpert MRSA Assay (FDA approved for NASAL specimens only), is one component of a comprehensive MRSA colonization surveillance program. It is not intended to diagnose MRSA infection nor to guide or monitor treatment for MRSA infections.          Radiology Studies: Ct Chest Wo Contrast  Result Date: 02/25/2016 CLINICAL DATA:  Pneumonia and shortness of breath EXAM: CT CHEST WITHOUT CONTRAST TECHNIQUE: Multidetector CT imaging of the chest was performed following the standard protocol without IV contrast. COMPARISON:  Chest x-ray 02/23/2016, CT chest 08/09/2015, PET-CT 04/03/2015, CT chest 03/21/2015 FINDINGS: Cardiovascular: Limited without intravenous contrast. Apparent origin of right subclavian artery with retroesophageal course. Atherosclerotic calcifications of the aorta. Mild ectasia. No aneurysm. Coronary artery calcifications. Mild cardiomegaly. Small amount of pericardial effusion. Mediastinum/Nodes: Adenopathy is present within the mediastinum. AP window lymph node measures 1.2 cm compared with 1.2 cm previously. Lymph node posterior to the superior vena cava measures 1.3 cm compared with 1.5 cm previously. Small hilar nodes are grossly unchanged. Stable 8 mm hypodense nodule left lobe of thyroid. Trachea is midline. Esophagus demonstrates mild air distention but is otherwise  unremarkable. Lungs/Pleura: Moderate severe emphysematous disease. Stable 5 mm pulmonary nodules within the right middle lobe an the right upper lobe. Small bilateral pleural effusions, right greater than left. Peripheral consolidations within the left greater than right lower lobes and mild consolidation in the right middle lobe. No pneumothorax. Upper Abdomen: No acute findings. Musculoskeletal: No acute or suspicious bone lesions. IMPRESSION: 1. Small bilateral pleural effusions, right greater than left with partial consolidations in the bilateral lower lobes and right middle lobes which may reflect pneumonia. 2. Moderate severe emphysematous disease as before. 3. Essentially stable mediastinal adenopathy compared with July 2017 CT. Electronically Signed   By: Donavan Foil M.D.   On: 02/25/2016 22:22        Scheduled Meds: . cefTRIAXone (ROCEPHIN)  IV  1 g Intravenous Q24H  . Chlorhexidine Gluconate Cloth  6 each Topical Q0600  . heparin  5,000 Units Subcutaneous Q8H  . insulin aspart  0-20 Units Subcutaneous TID WC  . insulin aspart  0-5 Units Subcutaneous QHS  . insulin glargine  22 Units Subcutaneous Q2200  . ipratropium-albuterol  3 mL Nebulization QID  . mupirocin ointment  1 application Nasal BID  . nicotine  21 mg Transdermal Daily  . pantoprazole  40 mg Oral Daily  . polyethylene glycol  17 g Oral Daily  . predniSONE  40 mg Oral Q breakfast  . senna-docusate  1 tablet Oral BID  . vancomycin  1,250 mg Intravenous Q12H   Continuous  Infusions:   LOS: 5 days    Time spent: 40 minutes    Shan Padgett, Geraldo Docker, MD [facility] Pager [phone]   If 7PM-7AM, please contact night-coverage [email or web address] Password [email or web address] 02/27/2016, 8:08 AM

## 2016-02-28 LAB — GLUCOSE, CAPILLARY
GLUCOSE-CAPILLARY: 86 mg/dL (ref 65–99)
GLUCOSE-CAPILLARY: 84 mg/dL (ref 65–99)
Glucose-Capillary: 101 mg/dL — ABNORMAL HIGH (ref 65–99)
Glucose-Capillary: 163 mg/dL — ABNORMAL HIGH (ref 65–99)
Glucose-Capillary: 234 mg/dL — ABNORMAL HIGH (ref 65–99)

## 2016-02-28 MED ORDER — FUROSEMIDE 40 MG PO TABS
40.0000 mg | ORAL_TABLET | Freq: Two times a day (BID) | ORAL | Status: DC
  Administered 2016-02-29 – 2016-03-01 (×3): 40 mg via ORAL
  Filled 2016-02-28 (×4): qty 1

## 2016-02-28 MED ORDER — PREDNISONE 20 MG PO TABS
20.0000 mg | ORAL_TABLET | Freq: Every day | ORAL | Status: DC
  Administered 2016-02-29 – 2016-03-01 (×2): 20 mg via ORAL
  Filled 2016-02-28 (×2): qty 1

## 2016-02-28 NOTE — Progress Notes (Signed)
[place] TEAM 1 - Stepdown/ICU TEAM  [name]  [id number] DOB: 26-Dec-1945 DOA: 02/22/2016 PCP: Binnie Rail, MD    Brief Narrative:  71 y.o. female w/ a hx of heart failure, chronic respiratory failure, COPD, high blood pressure, high cholesterol, and current tobacco abuse who presented to the hospital with shortness of breath 1 week and cough productive of yellow sputum.   In the ED she received 1 continuous neb treatment and IV Solu-Medrol. She was placed on BiPAP and given antibiotics.   Subjective: The pt is slowly but steadily making progress.  Her oxygen is being actively weaned back towards her baseline of 44 L during rest and 5 L during exertion.  She denies chest pain abdominal pain nausea or vomiting.  She feels that her exertional tolerance is improving. Assessment & Plan:  Acute bronchospastic COPD exacerbation - O2 dependant COPD Followed by Rafael Bihari - goal sat will be 86-92% - pt tells me she would not wish to be kept alive on a vent if it comes to that, but does wish for aggressive medical care short of that to include BIPAP - CT chest essentially w/o signif change since last CT - Pulm has seen in consultation - hopeful for d/c in 24-48hrs  Possible B LL Pneumonia v/s other chronic B LL process Negative for Flu or rep virus - has completed a seven-day course of empiric abx coverage   Possible lymphoma of chest W/u of pulmonary nodule and LAD included a PET scan (March 2017) noting intense hypermetabolic adenopathy worrisome for lymphoma - pt not felt to be a candidate by TCTS for bx/further w/u due to severe COPD - nodes did decrease in size on f/u CT July 2017 and remain stable on CT this admit - no further eval planned   Grade 1 diastolic CHF - Pulm HTN (A999333 Hg) Noted on TTE Feb 2017 - no evidence of severe volume overload on exam - is not on chronic diuretic tx - volume status stable on exam  Filed Weights   02/26/16 0500 02/27/16 0400 02/28/16 0500    Weight: 104.7 kg (230 lb 13.2 oz) 103.7 kg (228 lb 9.9 oz) 98.1 kg (216 lb 4.3 oz)    DM CBG controlled  Hypertension BP controlled   Acute kidney injury Creatinine 1.19 at presentation - resolved   Recent Labs Lab 02/22/16 0353 02/23/16 0253 02/26/16 0217 02/27/16 0302  CREATININE 1.19* 0.93 0.82 0.79    GERD  Tobacco abuse Counseled on absolute need to discontinue all tobacco use  Obesity - Body mass index is 37.12 kg/m.   MRSA screen +  DVT prophylaxis: SCDs Code Status: DO NOT INTUBATE  Family Communication: no family present at time of exam  Disposition Plan: SDU until O2 requirement further improves - d/c home when O2 requirements have returned to her baseline (4L when resting - 5L on exertion)  Consultants:  Pulmonary    Procedures: None  Antimicrobials:  Ceftriaxone 1/31 > 2/6 Vancomycin 2/1 > 2/7 Azithromycin 1/31  Objective: Blood pressure 112/75, pulse (!) 111, temperature 99 F (37.2 C), temperature source Oral, resp. rate 17, height 5\' 4"  (1.626 m), weight 98.1 kg (216 lb 4.3 oz), SpO2 93 %.  Intake/Output Summary (Last 24 hours) at 02/28/16 1523 Last data filed at 02/28/16 1430  Gross per 24 hour  Intake              860 ml  Output  250 ml  Net              610 ml   Filed Weights   02/26/16 0500 02/27/16 0400 02/28/16 0500  Weight: 104.7 kg (230 lb 13.2 oz) 103.7 kg (228 lb 9.9 oz) 98.1 kg (216 lb 4.3 oz)    Examination: General: no acute resp distress at rest in bed  Lungs: no wheezing on exam today Cardiovascular: Regular rhythm without murmur  Abdomen: Nontender, obese, soft, bowel sounds positive, no rebound Extremities:  Trace bilateral lower extremity edema without change  CBC:  Recent Labs Lab 02/22/16 0334 02/23/16 0253  WBC 11.1* 14.3*  NEUTROABS 6.9  --   HGB 12.9 10.7*  HCT 47.0* 40.2  MCV 78.6 80.6  PLT 314 [id number]   Basic Metabolic Panel:  Recent Labs Lab 02/22/16 0353 02/22/16 0737  02/23/16 0253 02/26/16 0217 02/27/16 0302  NA 145  --  143 139 140  K 4.3  --  4.9 3.4* 3.6  CL 106  --  105 96* 99*  CO2 31  --  28 34* 31  GLUCOSE 125*  --  198* 89 129*  BUN 22*  --  17 17 14   CREATININE 1.19*  --  0.93 0.82 0.79  CALCIUM 9.6  --  9.2 8.6* 9.0  MG  --  2.2  --   --  2.2  PHOS  --  4.7*  --   --   --    GFR: Estimated Creatinine Clearance: 76.6 mL/min (by C-G formula based on SCr of 0.79 mg/dL).  Liver Function Tests:  Recent Labs Lab 02/26/16 0217  AST 25  ALT 47  ALKPHOS 51  BILITOT 0.4  PROT 5.5*  ALBUMIN 3.1*    Cardiac Enzymes:  Recent Labs Lab 02/22/16 0737  TROPONINI <0.03    HbA1C: Hemoglobin A1C  Date/Time Value Ref Range Status  07/07/2015 02:50 PM 10.9  Final   Hgb A1c MFr Bld  Date/Time Value Ref Range Status  10/31/2015 05:02 PM 7.7 (H) 4.6 - 6.5 % Final    Comment:    Glycemic Control Guidelines for People with Diabetes:Non Diabetic:  <6%Goal of Therapy: <7%Additional Action Suggested:  >8%   01/20/2015 03:15 PM 6.9 (H) 4.6 - 6.5 % Final    Comment:    Glycemic Control Guidelines for People with Diabetes:Non Diabetic:  <6%Goal of Therapy: <7%Additional Action Suggested:  >8%     CBG:  Recent Labs Lab 02/27/16 1235 02/27/16 1721 02/27/16 2110 02/28/16 0800 02/28/16 1221  GLUCAP 156* 160* 104* 101* 163*    Recent Results (from the past 240 hour(s))  Respiratory Panel by PCR     Status: None   Collection Time: 02/22/16  3:34 AM  Result Value Ref Range Status   Adenovirus NOT DETECTED NOT DETECTED Final   Coronavirus 229E NOT DETECTED NOT DETECTED Final   Coronavirus HKU1 NOT DETECTED NOT DETECTED Final   Coronavirus NL63 NOT DETECTED NOT DETECTED Final   Coronavirus OC43 NOT DETECTED NOT DETECTED Final   Metapneumovirus NOT DETECTED NOT DETECTED Final   Rhinovirus / Enterovirus NOT DETECTED NOT DETECTED Final   Influenza A NOT DETECTED NOT DETECTED Final   Influenza B NOT DETECTED NOT DETECTED Final    Parainfluenza Virus 1 NOT DETECTED NOT DETECTED Final   Parainfluenza Virus 2 NOT DETECTED NOT DETECTED Final   Parainfluenza Virus 3 NOT DETECTED NOT DETECTED Final   Parainfluenza Virus 4 NOT DETECTED NOT DETECTED Final   Respiratory Syncytial Virus NOT  DETECTED NOT DETECTED Final   Bordetella pertussis NOT DETECTED NOT DETECTED Final   Chlamydophila pneumoniae NOT DETECTED NOT DETECTED Final   Mycoplasma pneumoniae NOT DETECTED NOT DETECTED Final  Blood culture (routine x 2)     Status: None   Collection Time: 02/22/16  5:30 AM  Result Value Ref Range Status   Specimen Description BLOOD LEFT HAND  Final   Special Requests IN PEDIATRIC BOTTLE 1ML  Final   Culture NO GROWTH 5 DAYS  Final   Report Status 02/27/2016 FINAL  Final  Blood culture (routine x 2)     Status: None   Collection Time: 02/22/16  5:35 AM  Result Value Ref Range Status   Specimen Description BLOOD RIGHT HAND  Final   Special Requests IN PEDIATRIC BOTTLE 1ML  Final   Culture NO GROWTH 5 DAYS  Final   Report Status 02/27/2016 FINAL  Final  MRSA PCR Screening     Status: Abnormal   Collection Time: 02/23/16  6:03 PM  Result Value Ref Range Status   MRSA by PCR POSITIVE (A) NEGATIVE Final    Comment:        The GeneXpert MRSA Assay (FDA approved for NASAL specimens only), is one component of a comprehensive MRSA colonization surveillance program. It is not intended to diagnose MRSA infection nor to guide or monitor treatment for MRSA infections.      Scheduled Meds: . cefTRIAXone (ROCEPHIN)  IV  1 g Intravenous Q24H  . Chlorhexidine Gluconate Cloth  6 each Topical Q0600  . furosemide  40 mg Intravenous BID  . heparin  5,000 Units Subcutaneous Q8H  . insulin aspart  0-20 Units Subcutaneous TID WC  . insulin aspart  0-5 Units Subcutaneous QHS  . insulin glargine  22 Units Subcutaneous Q2200  . ipratropium-albuterol  3 mL Nebulization QID  . mupirocin ointment  1 application Nasal BID  . nicotine  21 mg  Transdermal Daily  . pantoprazole  40 mg Oral Daily  . polyethylene glycol  17 g Oral Daily  . predniSONE  40 mg Oral Q breakfast  . senna-docusate  1 tablet Oral BID  . vancomycin  1,250 mg Intravenous Q12H     LOS: 6 days   Cherene Altes, MD [facility] Office  [phone] Pager - Text Page per Shea Evans as per below:  On-Call/Text Page:      [email or web address]      password [email or web address]  If 7PM-7AM, please contact night-coverage [email or web address] Password [facility] 02/28/2016, 3:23 PM

## 2016-02-29 DIAGNOSIS — J189 Pneumonia, unspecified organism: Secondary | ICD-10-CM

## 2016-02-29 DIAGNOSIS — N179 Acute kidney failure, unspecified: Secondary | ICD-10-CM

## 2016-02-29 LAB — GLUCOSE, CAPILLARY
GLUCOSE-CAPILLARY: 224 mg/dL — AB (ref 65–99)
GLUCOSE-CAPILLARY: 110 mg/dL — AB (ref 65–99)
Glucose-Capillary: 96 mg/dL (ref 65–99)
Glucose-Capillary: 162 mg/dL — ABNORMAL HIGH (ref 65–99)

## 2016-02-29 MED ORDER — IPRATROPIUM-ALBUTEROL 0.5-2.5 (3) MG/3ML IN SOLN
3.0000 mL | Freq: Three times a day (TID) | RESPIRATORY_TRACT | Status: DC
  Administered 2016-02-29 – 2016-03-01 (×4): 3 mL via RESPIRATORY_TRACT
  Filled 2016-02-29 (×4): qty 3

## 2016-02-29 NOTE — Progress Notes (Signed)
PROGRESS NOTE    [name]  [id number] DOB: March 13, 1945 DOA: 02/22/2016 PCP: Binnie Rail, MD   Brief Narrative:  71 y.o.BF PMHx Generalized anxiety disorder, Chronic Diastolic CHF, Pulmonary hypertension, chronic respiratory failure (on home 2 L O2 with activity/sleep), COPD, pulmonary nodule,Mass of mediastinum, HTN, HLD, current Tobacco Abuse, medical noncompliance  Presented to the hospital with shortness of breath 1 week and cough productive of yellow sputum.   In the ED she received 1 continuous neb treatment and IV Solu-Medrol. She was placed on BiPAP and given antibiotics.    Subjective: 2/8 A/O 4, sitting in chair comfortably on 6 L O2 to maintain SPO2 88-93%.   difficulty following conversation (can speak in 5 word sentences) secondary to acute on chronic respiratory failure with hypoxia. States is now on 4 L O2 resting, and 5 L O2 with exertion (currently on 5 L O2 with SPO2~92%). States understanding she has heart failure but does not see cardiologist. Base weight~225 pounds (102 kg). Continues to smoke   Assessment & Plan:   Principal Problem:   Acute on chronic respiratory failure with hypoxia and hypercapnia (HCC) Active Problems:   Hyperlipidemia   Essential hypertension   Tobacco abuse   Chronic diastolic heart failure (HCC)   Generalized anxiety disorder   Diabetes (HCC)   COPD exacerbation (HCC)   Chronic diastolic CHF (congestive heart failure) (HCC)   Pulmonary hypertension   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)   Acute bronchospastic COPD exacerbation - O2 dependant COPD Followed by Rafael Bihari - goal sat will be 86-92% - cont maximal medical support - pt tells me she would not wish to be kept alive on a vent if it comes to that, but does wish for aggressive medical care short of that to include BIPAP - CT chest essentially w/o signif change since last CT - Pulm to see in consultation today   -Ambulatory SPO2  pending  Possible B LL Pneumonia v/s other chronic B LL process Negative for Flu or rep virus - cont empiric abx to complete a seven-day course  Possible lymphoma of chest -W/u of pulmonary nodule and LAD included a PET scan (March 2017) noting intense hypermetabolic adenopathy worrisome for lymphoma  - pt not felt to be a candidate by TCTS for bx/further w/u due to severe COPD  - nodes did decrease in size on f/u CT July 2017 and remain stable on CT this admit   Grade 1 diastolic CHF - Pulm HTN (A999333 Hg) Noted on TTE Feb 2017 - no evidence of severe volume overload on exam - is not on chronic diuretic tx - volume status stable -Strict in and out since admission - 3.6 L -Daily weight Filed Weights   02/27/16 0400 02/28/16 0500 02/29/16 0600  Weight: 103.7 kg (228 lb 9.9 oz) 98.1 kg (216 lb 4.3 oz) 99.3 kg (218 lb 14.7 oz)  -Lasix 40 mg BID  DMType II uncontrolled with complications -123456 Hemoglobin A1c= 7.7  -Lantus 22 units daily -Resistant SSI   Hypertension No change in treatment plan today  Acute kidney injury Creatinine 1.19 at presentation - resolved  Lab Results  Component Value Date   CREATININE 0.79 02/27/2016   CREATININE 0.82 02/26/2016   CREATININE 0.93 02/23/2016   GERD  Tobacco abuse Counseled on absolute need to discontinue all tobacco use  Obesity - Body mass index is 39.62 kg/m.   MRSA screen +  Goals of care -2/6 PT/OT consult placed: Worsening O2 dependent  COPD, uncontrolled DM evaluate for CIR vs SNF   DVT prophylaxis: Subcutaneous heparin Code Status: Partial Family Communication: None Disposition Plan: SNF?   Consultants:  [facility]  Procedures/Significant Events:     VENTILATOR SETTINGS: BiPAP   Cultures   Antimicrobials: Anti-infectives    Start     Stop   02/26/16 2100  vancomycin (VANCOCIN) 1,250 mg in sodium chloride 0.9 % 250 mL IVPB     03/01/16 0859   02/23/16 0800  vancomycin (VANCOCIN) 1,250 mg in sodium  chloride 0.9 % 250 mL IVPB  Status:  Discontinued     02/26/16 1004   02/23/16 0500  cefTRIAXone (ROCEPHIN) 1 g in dextrose 5 % 50 mL IVPB     03/01/16 0459   02/22/16 0815  vancomycin (VANCOCIN) 2,000 mg in sodium chloride 0.9 % 500 mL IVPB     02/22/16 1138   02/22/16 0515  cefTRIAXone (ROCEPHIN) 1 g in dextrose 5 % 50 mL IVPB     02/22/16 0636   02/22/16 0515  azithromycin (ZITHROMAX) 500 mg in dextrose 5 % 250 mL IVPB     02/22/16 0710       Devices    LINES / TUBES:      Continuous Infusions:   Objective: Vitals:   02/29/16 0726 02/29/16 0855 02/29/16 1224 02/29/16 1448  BP: 108/66  115/65   Pulse: 91  77   Resp: 14  (!) 24   Temp: 98.8 F (37.1 C)  99.1 F (37.3 C)   TempSrc: Oral  Oral   SpO2: 100% 96% 100% 91%  Weight:      Height:        Intake/Output Summary (Last 24 hours) at 02/29/16 1639 Last data filed at 02/29/16 1300  Gross per 24 hour  Intake              960 ml  Output             1700 ml  Net             -740 ml   Filed Weights   02/27/16 0400 02/28/16 0500 02/29/16 0600  Weight: 103.7 kg (228 lb 9.9 oz) 98.1 kg (216 lb 4.3 oz) 99.3 kg (218 lb 14.7 oz)    Examination:  General: A/O 4, positive acute on chronic respiratory distress Eyes: negative scleral hemorrhage, negative anisocoria, negative icterus ENT: Negative Runny nose, negative gingival bleeding, Neck:  Negative scars, masses, torticollis, lymphadenopathy, JVD Lungs: tachypnea little to no air movement in all lung fields, negative wheezes or crackles Cardiovascular: Tachycardic, Regular  rhythm without murmur gallop or rub normal S1 and S2 Abdomen: negative abdominal pain, nondistended, positive soft, bowel sounds, no rebound, no ascites, no appreciable mass Extremities: No significant cyanosis, clubbing, or edema bilateral lower extremities Skin: Negative rashes, lesions, ulcers Psychiatric:  Extremely poor understanding of her medical conditions.  Central nervous system:   Cranial nerves II through XII intact, tongue/uvula midline, all extremities muscle strength 5/5, sensation intact throughout,  negative dysarthria, negative expressive aphasia, negative receptive aphasia.  .     Data Reviewed: Care during the described time interval was provided by me .  I have reviewed this patient's available data, including medical history, events of note, physical examination, and all test results as part of my evaluation. I have personally reviewed and interpreted all radiology studies.  CBC:  Recent Labs Lab 02/23/16 0253  WBC 14.3*  HGB 10.7*  HCT 40.2  MCV 80.6  PLT 301  Basic Metabolic Panel:  Recent Labs Lab 02/23/16 0253 02/26/16 0217 02/27/16 0302  NA 143 139 140  K 4.9 3.4* 3.6  CL 105 96* 99*  CO2 28 34* 31  GLUCOSE 198* 89 129*  BUN 17 17 14   CREATININE 0.93 0.82 0.79  CALCIUM 9.2 8.6* 9.0  MG  --   --  2.2   GFR: Estimated Creatinine Clearance: 77 mL/min (by C-G formula based on SCr of 0.79 mg/dL). Liver Function Tests:  Recent Labs Lab 02/26/16 0217  AST 25  ALT 47  ALKPHOS 51  BILITOT 0.4  PROT 5.5*  ALBUMIN 3.1*   No results for input(s): LIPASE, AMYLASE in the last 168 hours. No results for input(s): AMMONIA in the last 168 hours. Coagulation Profile: No results for input(s): INR, PROTIME in the last 168 hours. Cardiac Enzymes: No results for input(s): CKTOTAL, CKMB, CKMBINDEX, TROPONINI in the last 168 hours. BNP (last 3 results)  Recent Labs  03/10/15 1449  PROBNP 16.0   HbA1C: No results for input(s): HGBA1C in the last 72 hours. CBG:  Recent Labs Lab 02/28/16 1731 02/28/16 2028 02/28/16 2105 02/29/16 0729 02/29/16 1218  GLUCAP 234* 86 84 96 224*   Lipid Profile: No results for input(s): CHOL, HDL, LDLCALC, TRIG, CHOLHDL, LDLDIRECT in the last 72 hours. Thyroid Function Tests: No results for input(s): TSH, T4TOTAL, FREET4, T3FREE, THYROIDAB in the last 72 hours. Anemia Panel: No results for  input(s): VITAMINB12, FOLATE, FERRITIN, TIBC, IRON, RETICCTPCT in the last 72 hours. Urine analysis:    Component Value Date/Time   COLORURINE YELLOW 07/04/2015 1848   APPEARANCEUR CLOUDY (A) 07/04/2015 1848   LABSPEC 1.040 (H) 07/04/2015 1848   PHURINE 6.0 07/04/2015 1848   GLUCOSEU >1000 (A) 07/04/2015 1848   GLUCOSEU NEGATIVE 04/09/2006 1315   HGBUR NEGATIVE 07/04/2015 1848   BILIRUBINUR NEGATIVE 07/04/2015 1848   KETONESUR NEGATIVE 07/04/2015 1848   PROTEINUR NEGATIVE 07/04/2015 1848   UROBILINOGEN 1.0 04/04/2010 2323   NITRITE NEGATIVE 07/04/2015 1848   LEUKOCYTESUR NEGATIVE 07/04/2015 1848   Sepsis Labs: @LABRCNTIP (procalcitonin:4,lacticidven:4)  ) Recent Results (from the past 240 hour(s))  Respiratory Panel by PCR     Status: None   Collection Time: 02/22/16  3:34 AM  Result Value Ref Range Status   Adenovirus NOT DETECTED NOT DETECTED Final   Coronavirus 229E NOT DETECTED NOT DETECTED Final   Coronavirus HKU1 NOT DETECTED NOT DETECTED Final   Coronavirus NL63 NOT DETECTED NOT DETECTED Final   Coronavirus OC43 NOT DETECTED NOT DETECTED Final   Metapneumovirus NOT DETECTED NOT DETECTED Final   Rhinovirus / Enterovirus NOT DETECTED NOT DETECTED Final   Influenza A NOT DETECTED NOT DETECTED Final   Influenza B NOT DETECTED NOT DETECTED Final   Parainfluenza Virus 1 NOT DETECTED NOT DETECTED Final   Parainfluenza Virus 2 NOT DETECTED NOT DETECTED Final   Parainfluenza Virus 3 NOT DETECTED NOT DETECTED Final   Parainfluenza Virus 4 NOT DETECTED NOT DETECTED Final   Respiratory Syncytial Virus NOT DETECTED NOT DETECTED Final   Bordetella pertussis NOT DETECTED NOT DETECTED Final   Chlamydophila pneumoniae NOT DETECTED NOT DETECTED Final   Mycoplasma pneumoniae NOT DETECTED NOT DETECTED Final  Blood culture (routine x 2)     Status: None   Collection Time: 02/22/16  5:30 AM  Result Value Ref Range Status   Specimen Description BLOOD LEFT HAND  Final   Special Requests  IN PEDIATRIC BOTTLE 1ML  Final   Culture NO GROWTH 5 DAYS  Final  Report Status 02/27/2016 FINAL  Final  Blood culture (routine x 2)     Status: None   Collection Time: 02/22/16  5:35 AM  Result Value Ref Range Status   Specimen Description BLOOD RIGHT HAND  Final   Special Requests IN PEDIATRIC BOTTLE 1ML  Final   Culture NO GROWTH 5 DAYS  Final   Report Status 02/27/2016 FINAL  Final  MRSA PCR Screening     Status: Abnormal   Collection Time: 02/23/16  6:03 PM  Result Value Ref Range Status   MRSA by PCR POSITIVE (A) NEGATIVE Final    Comment:        The GeneXpert MRSA Assay (FDA approved for NASAL specimens only), is one component of a comprehensive MRSA colonization surveillance program. It is not intended to diagnose MRSA infection nor to guide or monitor treatment for MRSA infections.          Radiology Studies: No results found.      Scheduled Meds: . Chlorhexidine Gluconate Cloth  6 each Topical Q0600  . furosemide  40 mg Oral BID  . heparin  5,000 Units Subcutaneous Q8H  . insulin aspart  0-20 Units Subcutaneous TID WC  . insulin aspart  0-5 Units Subcutaneous QHS  . insulin glargine  22 Units Subcutaneous Q2200  . ipratropium-albuterol  3 mL Nebulization TID  . mupirocin ointment  1 application Nasal BID  . nicotine  21 mg Transdermal Daily  . pantoprazole  40 mg Oral Daily  . polyethylene glycol  17 g Oral Daily  . predniSONE  20 mg Oral Q breakfast  . senna-docusate  1 tablet Oral BID   Continuous Infusions:   LOS: 7 days    Time spent: 40 minutes    WOODS, Geraldo Docker, MD [facility] Pager [phone]   If 7PM-7AM, please contact night-coverage [email or web address] Password [facility] 02/29/2016, 4:39 PM

## 2016-02-29 NOTE — Progress Notes (Addendum)
Physical Therapy Evaluation Patient Details Name: [name] MRN: [id number] DOB: 07-Mar-1945   Patient seen for evaluation in conjunction with OT therapist. Patient demonstrates deficits in functional mobility as indicated below. Will need continued skilled PT to address deficits and maximize function. Will see as indicated and progress as tolerated. Recommend HHPT upon acute discharge.    02/27/16 0800  PT Visit Information  Last PT Received On 02/27/16  Assistance Needed +1 (+2 for line management)  PT/OT/SLP Co-Evaluation/Treatment Yes  Reason for Co-Treatment For patient/therapist safety (dove tailed)  PT goals addressed during session Mobility/safety with mobility  OT goals addressed during session ADL's and self-care  History of Present Illness 71 y.o. female w/ a hx of heart failure, chronic respiratory failure, COPD, high blood pressure, high cholesterol, and current tobacco abuse who presented to the hospital with shortness of breath 1 week and cough productive of yellow sputum. Acute bronchospastic COPD exacerbation - O2 dependant COPD, PNA  Precautions  Precaution Comments watch HR and O2 saturations   Home Living  Family/patient expects to be discharged to: Private residence  Living Arrangements Alone (Sometimes son comes )  Available Help at Discharge Family;Available PRN/intermittently  Type of Home Apartment  Home Access Stairs to enter  Entrance Stairs-Number of Steps 2  Entrance Stairs-Rails None  Home Layout One level  Bathroom Shower/Tub Tub/shower unit  Tax adviser - 4 wheels;BSC  Prior Function  Level of Independence Independent  Comments o2 dependent at baseline 4 liters at rest 5 with exertion  Communication  Communication No difficulties  Pain Assessment  Pain Assessment No/denies pain  Cognition  Arousal/Alertness Awake/alert  Behavior During Therapy WFL for tasks assessed/performed  Overall Cognitive Status  Within Functional Limits for tasks assessed  Lower Extremity Assessment  Lower Extremity Assessment Generalized weakness (limited ROM due to body habitus and edema)  Bed Mobility  General bed mobility comments sitting EOB upon arrival  Transfers  Overall transfer level Needs assistance  Equipment used Rolling walker (2 wheeled)  Transfers Sit to/from Stand  Sit to Stand Supervision  General transfer comment VCs for hand placement, supervision for safety and line management  Ambulation/Gait  Ambulation/Gait assistance Supervision;+2 safety/equipment  Ambulation Distance (Feet) 60 Feet  Assistive device Rolling walker (2 wheeled)  Gait Pattern/deviations Step-through pattern;Decreased stride length;Wide base of support  General Gait Details Slow but steady gait, 2 standing rest breaks. Noted desaturation with ambulation on HiFloNC 10 liters, saturations dropping to 86%, improved with rest  Gait velocity decreased  Gait velocity interpretation Below normal speed for age/gender  Balance  Overall balance assessment Needs assistance  Sitting balance-Leahy Scale Good  Sitting balance - Comments able to sit EOB without difficulty and perform some dynamic tasks  Standing balance support No upper extremity supported;During functional activity  Standing balance-Leahy Scale Fair  Standing balance comment able to static stand at sink without UE support  PT - End of Session  Equipment Utilized During Treatment Oxygen  Activity Tolerance Patient tolerated treatment well;Patient limited by fatigue  Patient left in chair;with call bell/phone within reach  Nurse Communication Mobility status  PT Assessment  PT Recommendation/Assessment Patient needs continued PT services  PT Problem List Decreased strength;Decreased activity tolerance;Decreased balance;Decreased mobility;Cardiopulmonary status limiting activity  PT Plan  PT Frequency (ACUTE ONLY) Min 3X/week  PT Treatment/Interventions (ACUTE  ONLY) DME instruction;Gait training;Stair training;Functional mobility training;Therapeutic activities;Therapeutic exercise;Balance training;Patient/family education  PT Recommendation  Follow Up Recommendations Home health PT;Supervision - Intermittent  PT equipment None  recommended by PT  Individuals Consulted  Consulted and Agree with Results and Recommendations Patient  Acute Rehab PT Goals  Patient Stated Goal to go home  PT Goal Formulation With patient  Time For Goal Achievement 03/12/16  Potential to Achieve Goals Good  PT Time Calculation  PT Start Time (ACUTE ONLY) 0836  PT Stop Time (ACUTE ONLY) 0905  PT Time Calculation (min) (ACUTE ONLY) 29 min  PT General Charges  $$ ACUTE PT VISIT 1 Procedure  PT Evaluation  $PT Eval Moderate Complexity 1 Procedure  Written Expression  Dominant Hand Right    Late Entry for 02/27/16 Alben Deeds, PT DPT  [phone]

## 2016-02-29 NOTE — Progress Notes (Signed)
Physical Therapy Treatment Patient Details Name: [name] MRN: [id number] DOB: Dec 27, 1945 Today's Date: 02/29/2016    History of Present Illness 71 y.o. female w/ a hx of heart failure, chronic respiratory failure, COPD, high blood pressure, high cholesterol, and current tobacco abuse who presented to the hospital with shortness of breath 1 week and cough productive of yellow sputum. Acute bronchospastic COPD exacerbation - O2 dependant COPD, PNA    PT Comments    Pt presented supine in bed with HOB elevated, awake and willing to participate in therapy session. Pt making good progress and ambulated within room without an AD. Pt ambulated on 5L of O2 with SPO2 decreasing to mid 80's with recovery to >90% with sitting rest breaks.   Initial PT evaluation performed on 02/27/16 and pt seen today for a follow-up treatment session. PT continuing to recommend pt d/c home with HHPT services. Pt would continue to benefit from skilled physical therapy services at this time while admitted and after d/c to address her limitations in order to improve her overall safety and independence with functional mobility.    Follow Up Recommendations  Home health PT;Supervision - Intermittent     Equipment Recommendations  None recommended by PT    Recommendations for Other Services       Precautions / Restrictions Restrictions Weight Bearing Restrictions: No    Mobility  Bed Mobility Overal bed mobility: Modified Independent                Transfers Overall transfer level: Needs assistance Equipment used: Rolling walker (2 wheeled) Transfers: Sit to/from Stand Sit to Stand: Supervision         General transfer comment: supervision for safety; pt performed sit-to-stand from bed and from [facility]  Ambulation/Gait Ambulation/Gait assistance: Supervision Ambulation Distance (Feet): 40 Feet Assistive device: None Gait Pattern/deviations: Step-through pattern;Decreased stride length;Wide base  of support Gait velocity: decreased Gait velocity interpretation: Below normal speed for age/gender General Gait Details: slow, steady gait   Stairs            Wheelchair Mobility    Modified Rankin (Stroke Patients Only)       Balance Overall balance assessment: Needs assistance Sitting-balance support: Feet supported Sitting balance-Leahy Scale: Good     Standing balance support: No upper extremity supported;During functional activity Standing balance-Leahy Scale: Fair                      Cognition Arousal/Alertness: Awake/alert Behavior During Therapy: WFL for tasks assessed/performed Overall Cognitive Status: Within Functional Limits for tasks assessed                      Exercises      General Comments        Pertinent Vitals/Pain Pain Assessment: No/denies pain    Home Living                      Prior Function            PT Goals (current goals can now be found in the care plan section) Acute Rehab PT Goals Patient Stated Goal: to go home tomorrow PT Goal Formulation: With patient Time For Goal Achievement: 03/12/16 Potential to Achieve Goals: Good Progress towards PT goals: Progressing toward goals    Frequency    Min 3X/week      PT Plan Current plan remains appropriate    Co-evaluation  End of Session Equipment Utilized During Treatment: Gait belt;Oxygen Activity Tolerance: Patient tolerated treatment well Patient left: in chair;with call bell/phone within reach     Time: 1620-1650 PT Time Calculation (min) (ACUTE ONLY): 30 min  Charges:  $Gait Training: 8-22 mins $Therapeutic Activity: 8-22 mins                    G CodesClearnce Sorrel Kyley Laurel 2016-03-10, 5:16 PM Sherie Don, [place], DPT [phone]

## 2016-02-29 NOTE — Progress Notes (Signed)
Name: [name] MRN: [id number] DOB: 12-Jan-1946    ADMISSION DATE:  02/22/2016 CONSULTATION DATE:  2/5  REFERRING MD :  Dr. Thereasa Solo  CHIEF COMPLAINT:  SOB  BRIEF PATIENT DESCRIPTION: 71 year old female with O2 dependent COPD admitted 2/1 for hypercarbic/hypoxemic respiratory failure secondary to COPD exacerbation +/- PNA.  SUBJECTIVE: Pt reports feeling better, hopeful to go home in am  VITAL SIGNS: Temp:  [98.4 F (36.9 C)-99.1 F (37.3 C)] 99.1 F (37.3 C) (02/08 1224) Pulse Rate:  [77-111] 77 (02/08 1224) Resp:  [14-24] 24 (02/08 1224) BP: (108-118)/(65-102) 115/65 (02/08 1224) SpO2:  [94 %-100 %] 100 % (02/08 1224) Weight:  [218 lb 14.7 oz (99.3 kg)] 218 lb 14.7 oz (99.3 kg) (02/08 0600)  PHYSICAL EXAMINATION: General:  Obese female in NAD HEENT: MM pink/moist Neuro: AAOx4, speech clear, MAE CV: s1s2 rrr, no m/r/g PULM: even/non-labored, lungs bilaterally distant but clear, no wheeze [id number], non-tender, bsx4 active  Extremities: warm/dry, trace LE edema  Skin: no rashes or lesions    Recent Labs Lab 02/23/16 0253 02/26/16 0217 02/27/16 0302  NA 143 139 140  K 4.9 3.4* 3.6  CL 105 96* 99*  CO2 28 34* 31  BUN 17 17 14   CREATININE 0.93 0.82 0.79  GLUCOSE 198* 89 129*    Recent Labs Lab 02/23/16 0253  HGB 10.7*  HCT 40.2  WBC 14.3*  PLT 301   No results found.  SIGNIFICANT EVENTS    STUDIES:  PFT - FEV-1 in 2008 was 63% with a diffusion capacity of 33% PFTs 07/2012 shows gold stage 3 copd with fev1 1.12L/49%, FVC 2.01L/70% - post bd parameters. No bd response, Ratio 60/77%. TLC 87%, DLCO 5/21% CT chest 2/4 > Small bilateral pleural effusions, right greater than left withpartial consolidations in the bilateral lower lobes and right middle lobes which may reflect pneumonia. Moderate severe emphysematous disease as before. Essentially stable mediastinal adenopathy compared with July 2017 CT.    ASSESSMENT / PLAN:  Acute on chronic  hypercarbic/hypoxemic respiratory failure COPD acute exacerbation (O2 dependent and on 20mg  prednisone daily) -wean O2 for sats 88-95% -has oxymizer at home, utilize for higher flow if needed -Continue prednisone taper to 20mg  and then hold until seen in pulmonary office  -Duoneb Q6 with PRN albuterol  -pulmonary hygiene -IS, mobilize -follow up with Dr. [name] as outpatient  CAP - cultures and RVP negative - Complete 7 days abx, rocephin / azithro   Chronic diastolic CHF - per primary  Lung nodule/mediastinal lymphadenopathy - previously worked up in the past, not a candidate for biopsy due to severe lung disease, unchanged on this admit CT  Tobacco abuse - down to one cigarette per day - smoking cessation counseling   PCCM will sign off.  Please call back if new needs arise.  Noe Gens, NP-C [facility] Pgr: [phone] or if no answer [phone] 02/29/2016, 2:46 PM   STAFF NOTE: I, Merrie Roof, MD FACP have personally reviewed patient's available data, including medical history, events of note, physical examination and test results as part of my evaluation. I have discussed with resident/NP and other care providers such as pharmacist, RN and RRT. In addition, I personally evaluated patient and elicited key findings of: awake, fc well, some increased RR after BM, she is slowly improving her rr with rest, she states she typically has reserve issues with exertion, pcxr reviewed and was neg balance last 24 hours , likely the continued diuresis is improving her  resp status well, maintain neg balance further, she does not wan the lasix late at night as keeping her up all night with urination, consider to alter regimen of timing lasix, continue to reduce steroids, should follow up with DR R as outpt, I updated pt in full   Lavon Paganini. Titus Mould, MD, [place] Pgr: [facility] 02/29/2016 3:55 PM

## 2016-03-01 ENCOUNTER — Telehealth: Payer: Self-pay | Admitting: Surgery

## 2016-03-01 ENCOUNTER — Other Ambulatory Visit: Payer: Self-pay | Admitting: Pulmonary Disease

## 2016-03-01 ENCOUNTER — Other Ambulatory Visit: Payer: Self-pay | Admitting: Internal Medicine

## 2016-03-01 DIAGNOSIS — I5032 Chronic diastolic (congestive) heart failure: Secondary | ICD-10-CM

## 2016-03-01 DIAGNOSIS — N179 Acute kidney failure, unspecified: Secondary | ICD-10-CM

## 2016-03-01 DIAGNOSIS — J181 Lobar pneumonia, unspecified organism: Secondary | ICD-10-CM

## 2016-03-01 DIAGNOSIS — J189 Pneumonia, unspecified organism: Secondary | ICD-10-CM

## 2016-03-01 DIAGNOSIS — J9611 Chronic respiratory failure with hypoxia: Secondary | ICD-10-CM

## 2016-03-01 LAB — GLUCOSE, CAPILLARY
GLUCOSE-CAPILLARY: 131 mg/dL — AB (ref 65–99)
GLUCOSE-CAPILLARY: 235 mg/dL — AB (ref 65–99)
Glucose-Capillary: 103 mg/dL — ABNORMAL HIGH (ref 65–99)

## 2016-03-01 MED ORDER — ALPRAZOLAM 0.5 MG PO TABS: 0.5000 mg | ORAL_TABLET | Freq: Every evening | ORAL | 0 refills | Status: DC | PRN

## 2016-03-01 MED ORDER — PANTOPRAZOLE SODIUM 40 MG PO TBEC: 40.0000 mg | DELAYED_RELEASE_TABLET | Freq: Every day | ORAL | 0 refills | Status: DC

## 2016-03-01 MED ORDER — FUROSEMIDE 40 MG PO TABS: 40.0000 mg | ORAL_TABLET | Freq: Every day | ORAL | 0 refills | Status: DC

## 2016-03-01 MED ORDER — NICOTINE 21 MG/24HR TD PT24: 21.0000 mg | MEDICATED_PATCH | Freq: Every day | TRANSDERMAL | 0 refills | Status: DC

## 2016-03-01 MED ORDER — PREDNISONE 20 MG PO TABS: 20.0000 mg | ORAL_TABLET | Freq: Every day | ORAL | 0 refills | Status: DC

## 2016-03-01 MED ORDER — INSULIN GLARGINE 100 UNIT/ML SOLOSTAR PEN: 22.0000 [IU] | PEN_INJECTOR | Freq: Every day | SUBCUTANEOUS | 11 refills | Status: DC

## 2016-03-01 NOTE — Progress Notes (Addendum)
SATURATION QUALIFICATIONS: (This note is used to comply with regulatory documentation for home oxygen)  Patient Saturations on Room Air at Rest = 90% on 6 L n/c  Patient Saturations on Room Air while Ambulating 78% on 6 L n/c  Patient Saturations on 6 l n/c Liters of oxygen while Ambulating = 78-90  Please briefly explain why patient needs home oxygen: COPD Pt ambulated 600 ft had to take 2 rest breaks and sit on walker seat in order to " catch her  breath"

## 2016-03-01 NOTE — Care Management Important Message (Signed)
Important Message  Patient Details  Name: [name] MRN: [id number] Date of Birth: August 03, 1945   Medicare Important Message Given:  Yes    Erol Flanagin T, RN 03/01/2016, 2:58 PM

## 2016-03-01 NOTE — Progress Notes (Signed)
Went over all discharge instructions with pt and she is aware of follow up appointment that was made for her to see Pulmonary - [facility] will continue to follow her

## 2016-03-01 NOTE — Care Management Note (Addendum)
Case Management Note  Patient Details  Name: [name] MRN: [id number] Date of Birth: June 30, 1945  Subjective/Objective:   Pt lives with son, is active with [facility] for RN and Aide, will need addition of PT when discharged.  Has home O2 through [facility], states she has optimizer for HFNC O2 but does not use it, routinely is on 4L O2 @ rest, 5L with exertion. Discussed need for HFNC O2, [facility] will arrange RT visit when discharged and will ensure pt has optimizer for HFNC O2.  Pt is considering life alert - states she is alone most of the time and wants to be able to call for assistance if needed.  Provided information on 3 life alert systems, pt will call all for further information.                           Expected Discharge Plan:  [place]  Discharge planning Services  CM Consult  Post Acute Care Choice:  Resumption of Svcs/PTA Provider  HH Arranged:  RN, Nurse's Aide, PT [facility] Agency:  [place]  Status of Service:  Completed   Girard Cooter, RN 03/01/2016, 7:23 AM

## 2016-03-01 NOTE — Discharge Summary (Addendum)
Physician Discharge Summary  [name] [id number] DOB: 1945-05-03 DOA: 02/22/2016  PCP: Binnie Rail, MD  Admit date: 02/22/2016 Discharge date: 03/01/2016  Time spent: 35 minutes  Recommendations for Outpatient Follow-up:  Acute bronchospastic COPD exacerbation/chronic respiratory failure with hypoxia - goal SPO2 88-93% -Patient will be discharged on high flow [place] starting at 6 L. 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 S/P diuresis.  -Discharge on Lasix daily.  -Strict in and out since admission - 3.6 L -Daily weight Filed Weights   02/28/16 0500 02/29/16 0600 03/01/16 0538  Weight: 98.1 kg (216 lb 4.3 oz) 99.3 kg (218 lb 14.7 oz) 97.1 kg (214 lb)  -Discharge weight will be considered patient's base weight. Patient to weigh herself daily and record findings in a journal  DMType II uncontrolled with complications -[id number] Hemoglobin A1c= 7.7  -Lantus 22 units daily -Schedule follow-up appointment with Dr. Billey Gosling in 1-2 weeks diabetes uncontrolled, HTN, tobacco abuse  Hypertension No change in treatment plan today  Acute kidney injury Creatinine 1.19 at presentation - resolved  Lab Results  Component Value Date   CREATININE 0.79 02/27/2016   CREATININE 0.82 02/26/2016   CREATININE 0.93 02/23/2016   -Resolved  Tobacco abuse Counseled on absolute need to discontinue all tobacco use    Discharge Diagnoses:  Principal Problem:   Acute on chronic respiratory failure with hypoxia and hypercapnia (HCC) Active Problems:   Hyperlipidemia   Essential hypertension   Tobacco abuse   Chronic diastolic heart failure (HCC)   Generalized anxiety disorder   Diabetes (HCC)   COPD exacerbation (HCC)   Chronic diastolic CHF (congestive heart failure) ([place])   Pulmonary hypertension   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)   Pneumonia of both lungs due to infectious organism   Acute kidney injury [facility])   Discharge Condition: Stable  Diet recommendation: Heart healthy/American diabetic Association  Filed Weights   02/28/16 0500 02/29/16 0600 03/01/16 0538  Weight: 98.1 kg (216 lb 4.3 oz) 99.3 kg (218 lb 14.7 oz) 97.1 kg (214 lb)    History of present illness:  71 y.o.BF PMHx Generalized anxiety disorder, Chronic Diastolic CHF, Pulmonary hypertension, chronic respiratory failure (on home 2 L O2 with activity/sleep), COPD, pulmonary nodule,Mass of mediastinum, HTN, HLD, current Tobacco Abuse, medical noncompliance  Presented to the hospital with shortness of breath 1 week and cough productive of yellow sputum.   In the ED she received 1 continuous neb treatment and IV Solu-Medrol. She was placed on BiPAP and given antibiotics.  During his hospitalization patient diagnosed with COPD exacerbation secondary to continued tobacco abuse and noncompliance with O2 prescription home. Patient also with that she has possible lymphoma and that cardiothoracic surgery has deemed any sort of biopsy/surgery as to high risk given her poor respiratory status. In addition  patient was also fluid overloaded and aggressive diuresis was administered.    Antibiotics Anti-infectives    Start     Stop   02/26/16 2100  vancomycin (VANCOCIN) 1,250 mg in sodium chloride 0.9 % 250 mL IVPB   Status:  Discontinued     02/28/16 1610   02/23/16 0800  vancomycin (VANCOCIN) 1,250 mg in sodium chloride 0.9 % 250 mL IVPB  Status:  Discontinued     02/26/16 1004   02/23/16 0500  cefTRIAXone (ROCEPHIN) 1 g in dextrose 5 % 50 mL IVPB  Status:  Discontinued     02/28/16 1610   02/22/16 0815  vancomycin (VANCOCIN) 2,000 mg in sodium chloride 0.9 % 500 mL IVPB     02/22/16 1138   02/22/16 0515  cefTRIAXone (ROCEPHIN) 1 g in dextrose 5 % 50 mL IVPB     02/22/16 0636   02/22/16 0515  azithromycin (ZITHROMAX) 500 mg in dextrose 5 % 250 mL IVPB     02/22/16 0710       Discharge Exam: Vitals:   03/01/16 0538 03/01/16 0800 03/01/16 0924 03/01/16 1200  BP:  117/66  125/60  Pulse:  92  (!) 104  Resp:  20  (!) 21  Temp:  98.6 F (37 C)  98.6 F (37 C)  TempSrc:  Oral  Oral  SpO2:  95% 95% 92%  Weight: 97.1 kg (214 lb)     Height:        General: A/O 4, positive acute on chronic respiratory distress Eyes: negative scleral hemorrhage, negative anisocoria, negative icterus ENT: Negative Runny nose, negative gingival bleeding, Neck:  Negative scars, masses, torticollis, lymphadenopathy, JVD Lungs: tachypnea little to no air movement in all lung fields, negative wheezes or crackles Cardiovascular: Tachycardic, Regular  rhythm without murmur gallop or rub normal S1 and S2  Discharge Instructions   Allergies as of 03/01/2016      Reactions   Ambien [zolpidem Tartrate] Other (See Comments)   Causes nightmares      Medication List    STOP taking these medications   cloNIDine 0.1 MG tablet Commonly known as:  CATAPRES   famotidine 20 MG tablet Commonly known as:  PEPCID   losartan 25 MG tablet Commonly known as:  COZAAR   metoprolol tartrate 25 MG tablet Commonly known as:  LOPRESSOR   omeprazole 20 MG capsule Commonly known as:  PRILOSEC   Pen Needles 32G X 4 MM Misc   simvastatin 10 MG tablet Commonly known as:  ZOCOR     TAKE these medications   albuterol (2.5  MG/3ML) 0.083% nebulizer solution Commonly known as:  PROVENTIL USE ONE VIAL IN NEBULIZER EVERY 6 HOURS AS NEEDED FOR WHEEZING What changed:  Another medication with the same name was removed. Continue taking this medication, and follow the directions you see here.   ALPRAZolam 0.5 MG tablet Commonly known as:  XANAX Take 1 tablet (0.5 mg total) by mouth at bedtime as needed for anxiety. What changed:  See the new instructions.   budesonide-formoterol 160-4.5 MCG/ACT inhaler Commonly known as:  SYMBICORT Inhale 2 puffs into the lungs 2 (two) times daily.   fluticasone 50 MCG/ACT nasal spray Commonly known as:  FLONASE Place 2 sprays into both nostrils daily as needed for allergies or rhinitis.   furosemide 40 MG tablet Commonly known as:  LASIX Take 1 tablet (40 mg total) by mouth daily.   GLUCOCOM LANCETS 33G Misc Use with Test Strips to take blood sugars  glucose blood test strip Commonly known as:  BAYER CONTOUR NEXT TEST Use to take blood sugars twice daily.   Insulin Glargine 100 UNIT/ML Solostar Pen Commonly known as:  LANTUS SOLOSTAR Inject 22 Units into the skin daily at 10 pm. What changed:  how much to take   nicotine 21 mg/24hr patch Commonly known as:  NICODERM CQ - dosed in mg/24 hours Place 1 patch (21 mg total) onto the skin daily. Start taking on:  03/02/2016   pantoprazole 40 MG tablet Commonly known as:  PROTONIX Take 1 tablet (40 mg total) by mouth daily. Start taking on:  03/02/2016   predniSONE 20 MG tablet Commonly known as:  DELTASONE Take 1 tablet (20 mg total) by mouth daily with breakfast. Start taking on:  03/02/2016   SPIRIVA HANDIHALER 18 MCG inhalation capsule Generic drug:  tiotropium PLACE 1 CAPSULE INTO HANDIHALER AND INHALE EVERY DAY      Allergies  Allergen Reactions  . Ambien [Zolpidem Tartrate] Other (See Comments)    Causes nightmares   Follow-up Information    [name], NP. Schedule an appointment as soon as  possible for a visit in 2 weeks.   Specialty:  Pulmonary Disease Why:  Schedule appointment for 2 weeks with Dr. [name] NP oxygen dependent COPD, noncompliance with medication Contact information: [address] [place] [address] [phone]        Stacy J Burns, MD. Schedule an appointment as soon as possible for a visit in 2 weeks.   Specialty:  Internal Medicine Why:  Schedule follow-up appointment with Dr. Billey Gosling in 1-2 weeks diabetes uncontrolled, HTN, tobacco abuse Contact information: [place] [address] [phone]            The results of significant diagnostics from this hospitalization (including imaging, microbiology, ancillary and laboratory) are listed below for reference.    Significant Diagnostic Studies: Ct Chest Wo Contrast  Result Date: 02/25/2016 CLINICAL DATA:  Pneumonia and shortness of breath EXAM: CT CHEST WITHOUT CONTRAST TECHNIQUE: Multidetector CT imaging of the chest was performed following the standard protocol without IV contrast. COMPARISON:  Chest x-ray 02/23/2016, CT chest 08/09/2015, PET-CT 04/03/2015, CT chest 03/21/2015 FINDINGS: Cardiovascular: Limited without intravenous contrast. Apparent origin of right subclavian artery with retroesophageal course. Atherosclerotic calcifications of the aorta. Mild ectasia. No aneurysm. Coronary artery calcifications. Mild cardiomegaly. Small amount of pericardial effusion. Mediastinum/Nodes: Adenopathy is present within the mediastinum. AP window lymph node measures 1.2 cm compared with 1.2 cm previously. Lymph node posterior to the superior vena cava measures 1.3 cm compared with 1.5 cm previously. Small hilar nodes are grossly unchanged. Stable 8 mm hypodense nodule left lobe of thyroid. Trachea is midline. Esophagus demonstrates mild air distention but is otherwise unremarkable. Lungs/Pleura: Moderate severe emphysematous disease. Stable 5 mm pulmonary nodules within the  right middle lobe an the right upper lobe. Small bilateral pleural effusions, right greater than left. Peripheral consolidations within the left greater than right lower lobes and mild consolidation in the right middle lobe. No pneumothorax. Upper Abdomen: No acute findings. Musculoskeletal: No acute or suspicious bone lesions. IMPRESSION: 1. Small bilateral pleural effusions, right greater than left with partial consolidations in the bilateral lower lobes and right middle lobes which may reflect pneumonia. 2. Moderate severe emphysematous disease as before. 3. Essentially stable mediastinal adenopathy compared with July 2017 CT. Electronically Signed   By: Donavan Foil M.D.   On: 02/25/2016 22:22   Dg Chest [facility] [address]  Result Date: 02/24/2016 CLINICAL DATA:  Follow up pneumonia. EXAM: PORTABLE CHEST 1 VIEW COMPARISON:  02/23/2016 and 02/22/2016 radiographs.  CT 08/09/2015. FINDINGS: 0655 hours. Lordotic positioning. The heart size and mediastinal contours are stable with cardiomegaly, aortic tortuosity and chronic hilar prominence. Patient has known adenopathy on prior CT. The lungs are hyperinflated with scattered scarring. There is no focal airspace disease, edema, pleural effusion or pneumothorax. Telemetry leads overlie the chest. IMPRESSION: Chronic obstructive pulmonary disease without evidence of focal pulmonary opacity. Stable cardiomegaly and chronic hilar prominence. Electronically Signed   By: Richardean Sale M.D.   On: 02/24/2016 08:51   Dg Chest Port 1 View  Result Date: 02/23/2016 CLINICAL DATA:  Respiratory failure. EXAM: PORTABLE CHEST 1 VIEW COMPARISON:  02/22/2016 . FINDINGS: Cardiomegaly with pulmonary vascular prominence and bilateral interstitial prominence with bilateral pleural effusions. Findings again consistent with congestive heart failure. No interim change. No pneumothorax. IMPRESSION: Congestive heart failure bilateral interstitial edema and bilateral pleural effusions.  Bilateral pneumonia cannot be excluded. No interim change from prior exam. Electronically Signed   By: Marcello Moores  Register   On: 02/23/2016 07:36   Dg Chest Portable 1 View  Result Date: 02/22/2016 CLINICAL DATA:  Shortness of breath tonight. EXAM: PORTABLE CHEST 1 VIEW COMPARISON:  Radiograph 01/08/2016, CT 08/09/2015 FINDINGS: The heart is enlarged, stable cardiomegaly. Worsening peribronchial thickening and interstitial opacities primarily at the bases. Emphysema with hyperinflation. Difficult to exclude left pleural effusion. No pneumothorax. IMPRESSION: Cardiomegaly. Worsening peribronchial thickening and interstitial opacities, may be pulmonary edema or atypical infection. Difficult to exclude left pleural effusion. Electronically Signed   By: Jeb Levering M.D.   On: 02/22/2016 04:10    Microbiology: Recent Results (from the past 240 hour(s))  Respiratory Panel by PCR     Status: None   Collection Time: 02/22/16  3:34 AM  Result Value Ref Range Status   Adenovirus NOT DETECTED NOT DETECTED Final   Coronavirus 229E NOT DETECTED NOT DETECTED Final   Coronavirus HKU1 NOT DETECTED NOT DETECTED Final   Coronavirus NL63 NOT DETECTED NOT DETECTED Final   Coronavirus OC43 NOT DETECTED NOT DETECTED Final   Metapneumovirus NOT DETECTED NOT DETECTED Final   Rhinovirus / Enterovirus NOT DETECTED NOT DETECTED Final   Influenza A NOT DETECTED NOT DETECTED Final   Influenza B NOT DETECTED NOT DETECTED Final   Parainfluenza Virus 1 NOT DETECTED NOT DETECTED Final   Parainfluenza Virus 2 NOT DETECTED NOT DETECTED Final   Parainfluenza Virus 3 NOT DETECTED NOT DETECTED Final   Parainfluenza Virus 4 NOT DETECTED NOT DETECTED Final   Respiratory Syncytial Virus NOT DETECTED NOT DETECTED Final   Bordetella pertussis NOT DETECTED NOT DETECTED Final   Chlamydophila pneumoniae NOT DETECTED NOT DETECTED Final   Mycoplasma pneumoniae NOT DETECTED NOT DETECTED Final  Blood culture (routine x 2)     Status:  None   Collection Time: 02/22/16  5:30 AM  Result Value Ref Range Status   Specimen Description BLOOD LEFT HAND  Final   Special Requests IN PEDIATRIC BOTTLE 1ML  Final   Culture NO GROWTH 5 DAYS  Final   Report Status 02/27/2016 FINAL  Final  Blood culture (routine x 2)     Status: None   Collection Time: 02/22/16  5:35 AM  Result Value Ref Range Status   Specimen Description BLOOD RIGHT HAND  Final   Special Requests IN PEDIATRIC BOTTLE 1ML  Final   Culture NO GROWTH 5 DAYS  Final   Report Status 02/27/2016 FINAL  Final  MRSA PCR Screening     Status: Abnormal   Collection Time: 02/23/16  6:03 PM  Result Value Ref Range Status   MRSA by PCR POSITIVE (A) NEGATIVE Final    Comment:        The GeneXpert MRSA Assay (FDA approved for NASAL specimens only), is one component of a comprehensive MRSA colonization surveillance program. It is not intended to diagnose MRSA infection nor to guide or monitor treatment for MRSA infections.      Labs: Basic Metabolic Panel:  Recent Labs Lab 02/26/16 0217 02/27/16 0302  NA 139 140  K 3.4* 3.6  CL 96* 99*  CO2 34* 31  GLUCOSE 89 129*  BUN 17 14  CREATININE 0.82 0.79  CALCIUM 8.6* 9.0  MG  --  2.2   Liver Function Tests:  Recent Labs Lab 02/26/16 0217  AST 25  ALT 47  ALKPHOS 51  BILITOT 0.4  PROT 5.5*  ALBUMIN 3.1*   No results for input(s): LIPASE, AMYLASE in the last 168 hours. No results for input(s): AMMONIA in the last 168 hours. CBC: No results for input(s): WBC, NEUTROABS, HGB, HCT, MCV, PLT in the last 168 hours. Cardiac Enzymes: No results for input(s): CKTOTAL, CKMB, CKMBINDEX, TROPONINI in the last 168 hours. BNP: BNP (last 3 results)  Recent Labs  02/22/16 0334  BNP 161.2*    ProBNP (last 3 results)  Recent Labs  03/10/15 1449  PROBNP 16.0    CBG:  Recent Labs Lab 02/29/16 1218 02/29/16 1649 02/29/16 2159 03/01/16 0836 03/01/16 1229  GLUCAP 224* 110* 162* 103* 131*        Signed:  Dia Crawford, MD [facility] [phone] pager

## 2016-03-01 NOTE — Progress Notes (Signed)
PROGRESS NOTE    [name]  XC:[id number] DOB: August 24, 1945 DOA: 02/22/2016 PCP: Binnie Rail, MD   Brief Narrative:  71 y.o.BF PMHx Generalized anxiety disorder, Chronic Diastolic CHF, Pulmonary hypertension, chronic respiratory failure (on home 2 L O2 with activity/sleep), COPD, pulmonary nodule,Mass of mediastinum, HTN, HLD, current Tobacco Abuse, medical noncompliance  Presented to the hospital with shortness of breath 1 week and cough productive of yellow sputum.   In the ED she received 1 continuous neb treatment and IV Solu-Medrol. She was placed on BiPAP and given antibiotics.    Subjective: 2/9   A/O 4, sitting in chair comfortably on 6 L O2 to maintain SPO2 88-93%.   difficulty following conversation (can speak in 5 word sentences) secondary to acute on chronic respiratory failure with hypoxia. States is now on 4 L O2 resting, and 5 L O2 with exertion (currently on 5 L O2 with SPO2~92%). States understanding she has heart failure but does not see cardiologist. Base weight~225 pounds (102 kg). Continues to smoke   Assessment & Plan:   Principal Problem:   Acute on chronic respiratory failure with hypoxia and hypercapnia (HCC) Active Problems:   Hyperlipidemia   Essential hypertension   Tobacco abuse   Chronic diastolic heart failure (HCC)   Generalized anxiety disorder   Diabetes (HCC)   COPD exacerbation (HCC)   Chronic diastolic CHF (congestive heart failure) (HCC)   Pulmonary hypertension   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)   Acute bronchospastic COPD exacerbation - O2 dependant COPD Followed by Rafael Bihari - goal sat will be 86-92% - cont maximal medical support - pt tells me she would not wish to be kept alive on a vent if it comes to that, but does wish for aggressive medical care short of that to include BIPAP - CT chest essentially w/o signif change since last CT - Pulm to see in consultation today   -Ambulatory SPO2  pending  Possible B LL Pneumonia v/s other chronic B LL process Negative for Flu or rep virus - cont empiric abx to complete a seven-day course  Possible lymphoma of chest -W/u of pulmonary nodule and LAD included a PET scan (March 2017) noting intense hypermetabolic adenopathy worrisome for lymphoma  - pt not felt to be a candidate by [facility]CTS for bx/further w/u due to severe COPD  - nodes did decrease in size on f/u CT July 2017 and remain stable on CT this admit   Grade 1 diastolic CHF - Pulm HTN ([id number] Hg) Noted on TTE Feb 2017 - no evidence of severe volume overload on exam - is not on chronic diuretic tx - volume status stable -Strict in and out since admission - 3.6 L -Daily weight Filed Weights   02/28/16 0500 02/29/16 0600 03/01/16 0538  Weight: 98.1 kg (216 lb 4.3 oz) 99.3 kg (218 lb 14.7 oz) 97.1 kg (214 lb)  -Lasix 40 mg BID  DMType II uncontrolled with complications -[id number] Hemoglobin A1c= 7.7  -Lantus 22 units daily -Resistant SSI   Hypertension No change in treatment plan today  Acute kidney injury Creatinine 1.19 at presentation - resolved  Lab Results  Component Value Date   CREATININE 0.79 02/27/2016   CREATININE 0.82 02/26/2016   CREATININE 0.93 02/23/2016   GERD  Tobacco abuse Counseled on absolute need to discontinue all tobacco use  Obesity - Body mass index is 39.62 kg/m.   MRSA screen +  Goals of care -2/6 PT/OT consult placed: Worsening O2 dependent  COPD, uncontrolled DM evaluate for CIR vs SNF   DVT prophylaxis: Subcutaneous heparin Code Status: Partial Family Communication: None Disposition Plan: SNF?   Consultants:  [facility] / [facility]  Procedures/Significant Events:     VENTILATOR SETTINGS: BiPAP   Cultures   Antimicrobials: Anti-infectives    Start     Stop   02/26/16 2100  vancomycin (VANCOCIN) 1,250 mg in sodium chloride 0.9 % 250 mL IVPB     03/01/16 0859   02/23/16 0800  vancomycin (VANCOCIN) 1,250 mg in sodium chloride  0.9 % 250 mL IVPB  Status:  Discontinued     02/26/16 1004   02/23/16 0500  cefTRIAXone (ROCEPHIN) 1 g in dextrose 5 % 50 mL IVPB     03/01/16 0459   02/22/16 0815  vancomycin (VANCOCIN) 2,000 mg in sodium chloride 0.9 % 500 mL IVPB     02/22/16 1138   02/22/16 0515  cefTRIAXone (ROCEPHIN) 1 g in dextrose 5 % 50 mL IVPB     02/22/16 0636   02/22/16 0515  azithromycin (ZITHROMAX) 500 mg in dextrose 5 % 250 mL IVPB     02/22/16 0710       Devices    LINES / TUBES:      Continuous Infusions:   Objective: Vitals:   03/01/16 0112 03/01/16 0424 03/01/16 0538 03/01/16 0800  BP: 110/62 (!) 112/56  117/66  Pulse: 98 91  92  Resp:  14  20  Temp: 98.1 F (36.7 C) 98.6 F (37 C)  98.6 F (37 C)  TempSrc: Oral Oral  Oral  SpO2: 93% 97%  95%  Weight:   97.1 kg (214 lb)   Height:        Intake/Output Summary (Last 24 hours) at 03/01/16 [id number] Last data filed at 02/29/16 2030  Gross per 24 hour  Intake              960 ml  Output             2700 ml  Net            -1740 ml   Filed Weights   02/28/16 0500 02/29/16 0600 03/01/16 0538  Weight: 98.1 kg (216 lb 4.3 oz) 99.3 kg (218 lb 14.7 oz) 97.1 kg (214 lb)    Examination:  General: A/O 4, positive acute on chronic respiratory distress Eyes: negative scleral hemorrhage, negative anisocoria, negative icterus ENT: Negative Runny nose, negative gingival bleeding, Neck:  Negative scars, masses, torticollis, lymphadenopathy, JVD Lungs: tachypnea little to no air movement in all lung fields, negative wheezes or crackles Cardiovascular: Tachycardic, Regular  rhythm without murmur gallop or rub normal S1 and S2 Abdomen: negative abdominal pain, nondistended, positive soft, bowel sounds, no rebound, no ascites, no appreciable mass Extremities: No significant cyanosis, clubbing, or edema bilateral lower extremities Skin: Negative rashes, lesions, ulcers Psychiatric:  Extremely poor understanding of her medical conditions.   Central nervous system:  Cranial nerves II through XII intact, tongue/uvula midline, all extremities muscle strength 5/5, sensation intact throughout,  negative dysarthria, negative expressive aphasia, negative receptive aphasia.  .     Data Reviewed: Care during the described time interval was provided by me .  I have reviewed this patient's available data, including medical history, events of note, physical examination, and all test results as part of my evaluation. I have personally reviewed and interpreted all radiology studies.  CBC: No results for input(s): WBC, NEUTROABS, HGB, HCT, MCV, PLT in the last 168 hours. Basic Metabolic Panel:  Recent Labs Lab 02/26/16 0217 02/27/16 0302  NA 139 140  K 3.4* 3.6  CL 96* 99*  CO2 34* 31  GLUCOSE 89 129*  BUN 17 14  CREATININE 0.82 0.79  CALCIUM 8.6* 9.0  MG  --  2.2   GFR: Estimated Creatinine Clearance: 76.2 mL/min (by C-G formula based on SCr of 0.79 mg/dL). Liver Function Tests:  Recent Labs Lab 02/26/16 0217  AST 25  ALT 47  ALKPHOS 51  BILITOT 0.4  PROT 5.5*  ALBUMIN 3.1*   No results for input(s): LIPASE, AMYLASE in the last 168 hours. No results for input(s): AMMONIA in the last 168 hours. Coagulation Profile: No results for input(s): INR, PROTIME in the last 168 hours. Cardiac Enzymes: No results for input(s): CKTOTAL, CKMB, CKMBINDEX, TROPONINI in the last 168 hours. BNP (last 3 results)  Recent Labs  03/10/15 1449  PROBNP 16.0   HbA1C: No results for input(s): HGBA1C in the last 72 hours. CBG:  Recent Labs Lab 02/29/16 0729 02/29/16 1218 02/29/16 1649 02/29/16 2159 03/01/16 0836  GLUCAP 96 224* 110* 162* 103*   Lipid Profile: No results for input(s): CHOL, HDL, LDLCALC, TRIG, CHOLHDL, LDLDIRECT in the last 72 hours. Thyroid Function Tests: No results for input(s): TSH, T4TOTAL, FREET4, T3FREE, THYROIDAB in the last 72 hours. Anemia Panel: No results for input(s): VITAMINB12, FOLATE,  FERRITIN, TIBC, IRON, RETICCTPCT in the last 72 hours. Urine analysis:    Component Value Date/Time   COLORURINE YELLOW 07/04/2015 1848   APPEARANCEUR CLOUDY (A) 07/04/2015 1848   LABSPEC 1.040 (H) 07/04/2015 1848   PHURINE 6.0 07/04/2015 1848   GLUCOSEU >1000 (A) 07/04/2015 1848   GLUCOSEU NEGATIVE 04/09/2006 1315   HGBUR NEGATIVE 07/04/2015 1848   BILIRUBINUR NEGATIVE 07/04/2015 1848   KETONESUR NEGATIVE 07/04/2015 1848   PROTEINUR NEGATIVE 07/04/2015 1848   UROBILINOGEN 1.0 04/04/2010 2323   NITRITE NEGATIVE 07/04/2015 1848   LEUKOCYTESUR NEGATIVE 07/04/2015 1848   Sepsis Labs: @LABRCNTIP (procalcitonin:4,lacticidven:4)  ) Recent Results (from the past 240 hour(s))  Respiratory Panel by PCR     Status: None   Collection Time: 02/22/16  3:34 AM  Result Value Ref Range Status   Adenovirus NOT DETECTED NOT DETECTED Final   Coronavirus 229E NOT DETECTED NOT DETECTED Final   Coronavirus HKU1 NOT DETECTED NOT DETECTED Final   Coronavirus NL63 NOT DETECTED NOT DETECTED Final   Coronavirus OC43 NOT DETECTED NOT DETECTED Final   Metapneumovirus NOT DETECTED NOT DETECTED Final   Rhinovirus / Enterovirus NOT DETECTED NOT DETECTED Final   Influenza A NOT DETECTED NOT DETECTED Final   Influenza B NOT DETECTED NOT DETECTED Final   Parainfluenza Virus 1 NOT DETECTED NOT DETECTED Final   Parainfluenza Virus 2 NOT DETECTED NOT DETECTED Final   Parainfluenza Virus 3 NOT DETECTED NOT DETECTED Final   Parainfluenza Virus 4 NOT DETECTED NOT DETECTED Final   Respiratory Syncytial Virus NOT DETECTED NOT DETECTED Final   Bordetella pertussis NOT DETECTED NOT DETECTED Final   Chlamydophila pneumoniae NOT DETECTED NOT DETECTED Final   Mycoplasma pneumoniae NOT DETECTED NOT DETECTED Final  Blood culture (routine x 2)     Status: None   Collection Time: 02/22/16  5:30 AM  Result Value Ref Range Status   Specimen Description BLOOD LEFT HAND  Final   Special Requests IN PEDIATRIC BOTTLE 1ML  Final    Culture NO GROWTH 5 DAYS  Final   Report Status 02/27/2016 FINAL  Final  Blood culture (routine x 2)  Status: None   Collection Time: 02/22/16  5:35 AM  Result Value Ref Range Status   Specimen Description BLOOD RIGHT HAND  Final   Special Requests IN PEDIATRIC BOTTLE 1ML  Final   Culture NO GROWTH 5 DAYS  Final   Report Status 02/27/2016 FINAL  Final  MRSA PCR Screening     Status: Abnormal   Collection Time: 02/23/16  6:03 PM  Result Value Ref Range Status   MRSA by PCR POSITIVE (A) NEGATIVE Final    Comment:        The GeneXpert MRSA Assay (FDA approved for NASAL specimens only), is one component of a comprehensive MRSA colonization surveillance program. It is not intended to diagnose MRSA infection nor to guide or monitor treatment for MRSA infections.          Radiology Studies: No results found.      Scheduled Meds: . furosemide  40 mg Oral BID  . heparin  5,000 Units Subcutaneous Q8H  . insulin aspart  0-20 Units Subcutaneous TID WC  . insulin aspart  0-5 Units Subcutaneous QHS  . insulin glargine  22 Units Subcutaneous Q2200  . ipratropium-albuterol  3 mL Nebulization TID  . mupirocin ointment  1 application Nasal BID  . nicotine  21 mg Transdermal Daily  . pantoprazole  40 mg Oral Daily  . polyethylene glycol  17 g Oral Daily  . predniSONE  20 mg Oral Q breakfast  . senna-docusate  1 tablet Oral BID   Continuous Infusions:   LOS: 8 days    Time spent: 40 minutes    WOODS, Geraldo Docker, MD [facility] Pager [phone]   If 7PM-7AM, please contact night-coverage [email or web address] Password [email or web address] 03/01/2016, 9:07 AM

## 2016-03-01 NOTE — Progress Notes (Signed)
Pt discharged per w/c with all personal belongings including cell phone , charger,clothes,02 tank and all discharge instructions including prescriptions. Sister picked her up at [place] entrance via private car to home

## 2016-03-01 NOTE — Progress Notes (Signed)
Occupational Therapy Treatment Patient Details Name: [name] MRN: [id number] DOB: [birth year] Today's Date: 03/01/2016    History of present illness 71 y.o. female w/ a hx of heart failure, chronic respiratory failure, COPD, high blood pressure, high cholesterol, and current tobacco abuse who presented to the hospital with shortness of breath 1 week and cough productive of yellow sputum. Acute bronchospastic COPD exacerbation - O2 dependant COPD, PNA   OT comments  This 71 yo female seen today for treatment session focusing on getting ready to go home with pt getting dressed and discussing energy conservation strategies at home. Pt making progress and will continue to as long as she pays attention to her body and rests when it is telling her to as well as quitting smoking.   Follow Up Recommendations  No OT follow up;Supervision - Intermittent    Equipment Recommendations  None recommended by OT       Precautions / Restrictions Precautions Precaution Comments: watch HR and O2 saturations  Restrictions Weight Bearing Restrictions: No       Mobility Bed Mobility               General bed mobility comments: Pt up on BSC upon my arrival  Transfers Overall transfer level: Needs assistance Equipment used: None Transfers: Sit to/from Stand Sit to Stand: Modified independent (Device/Increase time)              Balance   Sitting-balance support: No upper extremity supported;Feet supported Sitting balance-Leahy Scale: Good Sitting balance - Comments: able to sit on BSC and cross legs to get socks on   Standing balance support: No upper extremity supported;During functional activity Standing balance-Leahy Scale: Fair Standing balance comment: able to stand and pull up pants                   ADL Overall ADL's : Needs assistance/impaired                 Upper Body Dressing : Set up;Sitting   Lower Body Dressing: Set up;Sit to/from stand                  General ADL Comments: Educated pt on taking one thing at a time and not getting in a hurry with anything she is trying to do. If her body says I need to stop and breath/rest then do this; we talked about her using her rollator in the house so she would have something to sit on whenever she needed to and that sitting to do something takes less energy than standing to do the same task. She also reports that she and her son smoke at home and she realizes that this is not good especially with her need for O2 but not sure how to quit--I advised her to talk to her PCP about perhaps some meds that could help her and I told her that others that I have known have chewed gum, sucked on hard candy, chewed on a straw.                Cognition   Behavior During Therapy: WFL for tasks assessed/performed Overall Cognitive Status: Within Functional Limits for tasks assessed                                    Pertinent Vitals/ Pain       Pain Assessment: No/denies pain  Frequency  Min 2X/week        Progress Toward Goals  OT Goals(current goals can now be found in the care plan section)  Progress towards OT goals: Progressing toward goals     Plan Discharge plan remains appropriate       End of Session Equipment Utilized During Treatment:  (O2 5 liters)   Activity Tolerance  (Pt had to rest prn due to DOE)   Patient Left  (sitting on BSC as a chair (she stated she felt good sitting there))   Nurse Communication  (pt ready to go from OT standpoint)        Time: 1344-1419 OT Time Calculation (min): 35 min  Charges: OT General Charges $OT Visit: 1 Procedure OT Treatments $Self Care/Home Management : 23-37 mins  Almon Register [id number] 03/01/2016, 3:01 PM

## 2016-03-01 NOTE — Progress Notes (Signed)
[facility] delivered 02 tank and tubing

## 2016-03-01 NOTE — Telephone Encounter (Signed)
ED CM received a call from nurse on 4E concerning  patient calling regarding home oxygen not being delivered to home. ED CM reviewed patient's record no orders noted for home oxygen this hospitalization.  CM contacted patient who states, she has oxygen at home but her prescription has increased to high flow 6L continuous, and her concentrator max flow is 5L. Contacted nurse regarding to contact Provider to have him place an order for the increase home oxygen. Contacted [facility] patient receives her oxygen from them. CM explained that I will fax the order over and they will attempt to get the oxygen concentrator delivered to night. CM explained that due to after hours it may be up until several hours before delivery, suggested patient return to ED if patient should have SOB before new oxygen concentrator delivered.  CM will follow up with [facility] in the am

## 2016-03-03 DIAGNOSIS — J441 Chronic obstructive pulmonary disease with (acute) exacerbation: Secondary | ICD-10-CM | POA: Diagnosis not present

## 2016-03-03 DIAGNOSIS — K219 Gastro-esophageal reflux disease without esophagitis: Secondary | ICD-10-CM | POA: Diagnosis not present

## 2016-03-03 DIAGNOSIS — F419 Anxiety disorder, unspecified: Secondary | ICD-10-CM | POA: Diagnosis not present

## 2016-03-03 DIAGNOSIS — I5032 Chronic diastolic (congestive) heart failure: Secondary | ICD-10-CM | POA: Diagnosis not present

## 2016-03-03 DIAGNOSIS — I110 Hypertensive heart disease with heart failure: Secondary | ICD-10-CM | POA: Diagnosis not present

## 2016-03-03 DIAGNOSIS — E1165 Type 2 diabetes mellitus with hyperglycemia: Secondary | ICD-10-CM | POA: Diagnosis not present

## 2016-03-04 ENCOUNTER — Other Ambulatory Visit: Payer: Self-pay | Admitting: Internal Medicine

## 2016-03-04 NOTE — Telephone Encounter (Signed)
[place] controlled substance database checked.  Ok to fill medication. rx printed 

## 2016-03-04 NOTE — Telephone Encounter (Signed)
Please advise, pt was given #4 on 03/01/16

## 2016-03-05 ENCOUNTER — Inpatient Hospital Stay: Payer: Medicare Other | Admitting: Internal Medicine

## 2016-03-05 DIAGNOSIS — K219 Gastro-esophageal reflux disease without esophagitis: Secondary | ICD-10-CM | POA: Diagnosis not present

## 2016-03-05 DIAGNOSIS — I5032 Chronic diastolic (congestive) heart failure: Secondary | ICD-10-CM | POA: Diagnosis not present

## 2016-03-05 DIAGNOSIS — I110 Hypertensive heart disease with heart failure: Secondary | ICD-10-CM | POA: Diagnosis not present

## 2016-03-05 DIAGNOSIS — J441 Chronic obstructive pulmonary disease with (acute) exacerbation: Secondary | ICD-10-CM | POA: Diagnosis not present

## 2016-03-05 DIAGNOSIS — F419 Anxiety disorder, unspecified: Secondary | ICD-10-CM | POA: Diagnosis not present

## 2016-03-05 DIAGNOSIS — E1165 Type 2 diabetes mellitus with hyperglycemia: Secondary | ICD-10-CM | POA: Diagnosis not present

## 2016-03-05 NOTE — Telephone Encounter (Signed)
RX faxed to [facility] 

## 2016-03-06 DIAGNOSIS — I110 Hypertensive heart disease with heart failure: Secondary | ICD-10-CM | POA: Diagnosis not present

## 2016-03-06 DIAGNOSIS — F419 Anxiety disorder, unspecified: Secondary | ICD-10-CM | POA: Diagnosis not present

## 2016-03-06 DIAGNOSIS — K219 Gastro-esophageal reflux disease without esophagitis: Secondary | ICD-10-CM | POA: Diagnosis not present

## 2016-03-06 DIAGNOSIS — E1165 Type 2 diabetes mellitus with hyperglycemia: Secondary | ICD-10-CM | POA: Diagnosis not present

## 2016-03-06 DIAGNOSIS — I5032 Chronic diastolic (congestive) heart failure: Secondary | ICD-10-CM | POA: Diagnosis not present

## 2016-03-06 DIAGNOSIS — J441 Chronic obstructive pulmonary disease with (acute) exacerbation: Secondary | ICD-10-CM | POA: Diagnosis not present

## 2016-03-07 DIAGNOSIS — K219 Gastro-esophageal reflux disease without esophagitis: Secondary | ICD-10-CM | POA: Diagnosis not present

## 2016-03-07 DIAGNOSIS — J441 Chronic obstructive pulmonary disease with (acute) exacerbation: Secondary | ICD-10-CM | POA: Diagnosis not present

## 2016-03-07 DIAGNOSIS — E1165 Type 2 diabetes mellitus with hyperglycemia: Secondary | ICD-10-CM | POA: Diagnosis not present

## 2016-03-07 DIAGNOSIS — I5032 Chronic diastolic (congestive) heart failure: Secondary | ICD-10-CM | POA: Diagnosis not present

## 2016-03-07 DIAGNOSIS — F419 Anxiety disorder, unspecified: Secondary | ICD-10-CM | POA: Diagnosis not present

## 2016-03-07 DIAGNOSIS — I110 Hypertensive heart disease with heart failure: Secondary | ICD-10-CM | POA: Diagnosis not present

## 2016-03-08 DIAGNOSIS — I5032 Chronic diastolic (congestive) heart failure: Secondary | ICD-10-CM | POA: Diagnosis not present

## 2016-03-08 DIAGNOSIS — I110 Hypertensive heart disease with heart failure: Secondary | ICD-10-CM | POA: Diagnosis not present

## 2016-03-08 DIAGNOSIS — K219 Gastro-esophageal reflux disease without esophagitis: Secondary | ICD-10-CM | POA: Diagnosis not present

## 2016-03-08 DIAGNOSIS — E1165 Type 2 diabetes mellitus with hyperglycemia: Secondary | ICD-10-CM | POA: Diagnosis not present

## 2016-03-08 DIAGNOSIS — F419 Anxiety disorder, unspecified: Secondary | ICD-10-CM | POA: Diagnosis not present

## 2016-03-08 DIAGNOSIS — J441 Chronic obstructive pulmonary disease with (acute) exacerbation: Secondary | ICD-10-CM | POA: Diagnosis not present

## 2016-03-12 DIAGNOSIS — E1165 Type 2 diabetes mellitus with hyperglycemia: Secondary | ICD-10-CM | POA: Diagnosis not present

## 2016-03-12 DIAGNOSIS — I5032 Chronic diastolic (congestive) heart failure: Secondary | ICD-10-CM | POA: Diagnosis not present

## 2016-03-12 DIAGNOSIS — F419 Anxiety disorder, unspecified: Secondary | ICD-10-CM | POA: Diagnosis not present

## 2016-03-12 DIAGNOSIS — I110 Hypertensive heart disease with heart failure: Secondary | ICD-10-CM | POA: Diagnosis not present

## 2016-03-12 DIAGNOSIS — J441 Chronic obstructive pulmonary disease with (acute) exacerbation: Secondary | ICD-10-CM | POA: Diagnosis not present

## 2016-03-12 DIAGNOSIS — K219 Gastro-esophageal reflux disease without esophagitis: Secondary | ICD-10-CM | POA: Diagnosis not present

## 2016-03-14 ENCOUNTER — Telehealth: Payer: Self-pay | Admitting: Adult Health

## 2016-03-14 DIAGNOSIS — I110 Hypertensive heart disease with heart failure: Secondary | ICD-10-CM | POA: Diagnosis not present

## 2016-03-14 DIAGNOSIS — E1165 Type 2 diabetes mellitus with hyperglycemia: Secondary | ICD-10-CM | POA: Diagnosis not present

## 2016-03-14 DIAGNOSIS — F419 Anxiety disorder, unspecified: Secondary | ICD-10-CM | POA: Diagnosis not present

## 2016-03-14 DIAGNOSIS — J441 Chronic obstructive pulmonary disease with (acute) exacerbation: Secondary | ICD-10-CM | POA: Diagnosis not present

## 2016-03-14 DIAGNOSIS — I5032 Chronic diastolic (congestive) heart failure: Secondary | ICD-10-CM | POA: Diagnosis not present

## 2016-03-14 DIAGNOSIS — K219 Gastro-esophageal reflux disease without esophagitis: Secondary | ICD-10-CM | POA: Diagnosis not present

## 2016-03-14 NOTE — Telephone Encounter (Signed)
Pt states that she was seen in the [facility] 02/22/16 and was discharged home on 6 liters O2 Pt states that she was given 2 portable tanks but was told that on the 6 liters they would only last for 27mins. Pt states that she was told that she could not get any more than the two tanks because her insurance will not cover her to have but only two >> these are refillable with her concentrator. Pt states that she has a larger tank that she was told not to use unless it was an emergency, states that she was told this was to use ONLY if her concentrator quit working.  Pt states that it takes 30 minutes to get to our office for an appt and she is afraid that her O2 tanks will run out before she gets here or gets back home. Needs to know what can be done to ensure she has adequate O2 for traveling.  Pt has an appt with TP on 03/18/16.  DME: [facility]  Called [facility], LM with answering service to return call

## 2016-03-15 DIAGNOSIS — K219 Gastro-esophageal reflux disease without esophagitis: Secondary | ICD-10-CM | POA: Diagnosis not present

## 2016-03-15 DIAGNOSIS — F419 Anxiety disorder, unspecified: Secondary | ICD-10-CM | POA: Diagnosis not present

## 2016-03-15 DIAGNOSIS — I5032 Chronic diastolic (congestive) heart failure: Secondary | ICD-10-CM | POA: Diagnosis not present

## 2016-03-15 DIAGNOSIS — I110 Hypertensive heart disease with heart failure: Secondary | ICD-10-CM | POA: Diagnosis not present

## 2016-03-15 DIAGNOSIS — J441 Chronic obstructive pulmonary disease with (acute) exacerbation: Secondary | ICD-10-CM | POA: Diagnosis not present

## 2016-03-15 DIAGNOSIS — E1165 Type 2 diabetes mellitus with hyperglycemia: Secondary | ICD-10-CM | POA: Diagnosis not present

## 2016-03-15 NOTE — Telephone Encounter (Signed)
She was approved for additional tank but she will have to pick it up at the retail shop [place]

## 2016-03-15 NOTE — Telephone Encounter (Signed)
Called and spoke to [name], [facility]. Questioned if pt can get more than 2 tanks at a time since she is at such a high liter flow. Lytle Michaels states he will contact his supervisor to get approval and will then call us back.

## 2016-03-15 NOTE — Telephone Encounter (Signed)
Spoke with the pt and she was made aware she was approved for additional tank  She is aware she has to pick this up  She will call [facility] if has any further questions about this  Nothing further needed at this time per pt

## 2016-03-18 ENCOUNTER — Telehealth: Payer: Self-pay | Admitting: Adult Health

## 2016-03-18 ENCOUNTER — Encounter: Payer: Self-pay | Admitting: Adult Health

## 2016-03-18 ENCOUNTER — Ambulatory Visit (INDEPENDENT_AMBULATORY_CARE_PROVIDER_SITE_OTHER): Payer: Medicare Other | Admitting: Adult Health

## 2016-03-18 VITALS — BP 102/60 | HR 105 | Ht 64.0 in | Wt 217.6 lb

## 2016-03-18 DIAGNOSIS — J441 Chronic obstructive pulmonary disease with (acute) exacerbation: Secondary | ICD-10-CM | POA: Diagnosis not present

## 2016-03-18 DIAGNOSIS — J9611 Chronic respiratory failure with hypoxia: Secondary | ICD-10-CM | POA: Diagnosis not present

## 2016-03-18 DIAGNOSIS — J189 Pneumonia, unspecified organism: Secondary | ICD-10-CM

## 2016-03-18 DIAGNOSIS — R590 Localized enlarged lymph nodes: Secondary | ICD-10-CM | POA: Diagnosis not present

## 2016-03-18 DIAGNOSIS — G479 Sleep disorder, unspecified: Secondary | ICD-10-CM

## 2016-03-18 DIAGNOSIS — J181 Lobar pneumonia, unspecified organism: Secondary | ICD-10-CM

## 2016-03-18 MED ORDER — TIOTROPIUM BROMIDE MONOHYDRATE 2.5 MCG/ACT IN AERS: 2.0000 | INHALATION_SPRAY | Freq: Every day | RESPIRATORY_TRACT | 5 refills | Status: DC

## 2016-03-18 MED ORDER — TIOTROPIUM BROMIDE MONOHYDRATE 2.5 MCG/ACT IN AERS: 2.0000 | INHALATION_SPRAY | Freq: Every day | RESPIRATORY_TRACT | 0 refills | Status: DC

## 2016-03-18 MED ORDER — ALBUTEROL SULFATE HFA 108 (90 BASE) MCG/ACT IN AERS: 2.0000 | INHALATION_SPRAY | Freq: Four times a day (QID) | RESPIRATORY_TRACT | 5 refills | Status: DC | PRN

## 2016-03-18 NOTE — Telephone Encounter (Signed)
Santiago Glad calling from [facility] to check on the status of pt request to change o2 she can be reached @ [phone].Hillery Hunter

## 2016-03-18 NOTE — Telephone Encounter (Signed)
TP  Please Advise-  You saw this pt today  and she stated she does not understand why in her pt instructions that it states 3L of O2 at rest and 4L with exertion. She stated she did walk but the results were not explained to her and she thought she was suppose to be on 6L, that is what was told she needed since leaving the hospital.

## 2016-03-18 NOTE — Assessment & Plan Note (Signed)
?  etiology with stablility on CT since 07/2015  Cont on steroids for now , try to decrease steorids to 10mg  daily .  follow up in 3-4 weeks Dr. [name]

## 2016-03-18 NOTE — Telephone Encounter (Signed)
Spoke with patient, she was discharged home on 6lpm and this is the liter flow she used when coming to the office today   Per TP: continue same.  Will update chart Nothing further needed; will sign off

## 2016-03-18 NOTE — Telephone Encounter (Signed)
Attempted to reach [name] at the number provided but was not able to reach her at the number provided in the message. Contacted [facility] at [phone] and they stated they did not see any note from anyone names [name] and is unsure as to what she might have needed. Nothing further is needed at this time.

## 2016-03-18 NOTE — Addendum Note (Signed)
Addended by: Parke Poisson E on: 03/18/2016 11:42 AM   Modules accepted: Orders

## 2016-03-18 NOTE — Patient Instructions (Signed)
Decreased  Prednisone 20mg  daily for 1 week, then 10mg  daily  Until seen back in office .  Continue on Symbicort and Spiriva , rinse after use.  Work on not smoking  Continue on Oxygen 3l/m rest and 4l/m act  Set up for sleep study .  Follow up with Dr. [name] in 1 months and As needed   Please contact office for sooner follow up if symptoms do not improve or worsen or seek emergency care

## 2016-03-18 NOTE — Assessment & Plan Note (Signed)
COPD exacerbation , improved   Plan  Patient Instructions  Decreased  Prednisone 20mg  daily for 1 week, then 10mg  daily  Until seen back in office .  Continue on Symbicort and Spiriva , rinse after use.  Work on not smoking  Continue on Oxygen 3l/m rest and 4l/m act  Set up for sleep study .  Follow up with Dr. [name] in 1 months and As needed   Please contact office for sooner follow up if symptoms do not improve or worsen or seek emergency care

## 2016-03-18 NOTE — Progress Notes (Signed)
@Patient  ID: [name], female    DOB: 1945/06/07, 71 y.o.   MRN: [id number]  Chief Complaint  Patient presents with  . Follow-up    COPD     Referring provider: Binnie Rail, MD  HPI: 71 yo female smoker  With Severe  COPD ,O2 dependent.   TEST  FEV-1 in 2008 was 63% with a diffusion capacity of 33% PFTs 78/14 shows gold stage 3 copd with fev1 1.12L/49%, FVC 2.01L/70% - post bd parameters. No bd response, Ratio 60/77%. TLC 87%, DLCO 5/21% Duplex LE - 07/21/12 - negative. BNP 07/16/12 - 25  2 D echo on 03/08/15 showed EF 60-65%, Grade 1 DD , PAP mod increased at 5mmHg.  OV April 2012: Obese female hospitalized in March 2012 and now following. 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 OVerall doing much better since discharge. Using o2. No insurance. Unable to afford xopenex and pulmicort neb. Able to afford atroven neb. Wanting med change. Dysponea is at baseline class 3. Reportedly cut down on smoking to 1 cig/day. Mild pedal edema improving. Admits to snoring and excess day time somnolence  FEV-1 in 2008 was 63% with a diffusion capacity of 33% 2 D echo on 03/08/15 showed EF 60-65%, Grade 1 DD , PAP mod increased at 65mmHg.  CT chest 03/21/15 showed increased mediastinal/bilateral hilar lymphadenopahty worrisome for lymphoma. Unchanged pulmonary nodules x 2 years.  Subsequent PET scan showed intensely hypermetabolic  adenopahty in chest .  Referred to [facility] 03/2015 >too sick for bx.  Walk test in office 05/12/2015 > on 3 L baseline oxygen walking 185 feet 3 laps: She only walk one lap and then she desaturated to 86%. At this point her oxygen was increased to 6 L and then she walk one lap and maintained a pulse ox of 90%. After that she was dyspneic and did  not want walk further.   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 Patient was recently admitted for acute COPD exacerbation +/- PNA . And Diastolic Dysfunction.  Patient was treated with IV antibiotics, steroids and nebulized bronchodilators. She did require diuresis. He did require initial BiPAP support. ABG showed PCO2 was 68 .   Viral panel was negative CT chest showed small bilateral pleural effusions, right greater than left, with partial consolidations in the bilateral lobes and right middle lobes. Moderate severe emphysema, stable mediastinal adenopathy compared to July 2017 She remains on prednisone 20mg  daily .   Quit smoking for short period , but has restarted . Discussed smoking cessation.   Remains o Symbicort and Spiriva . Wants to change from Spiriva handihaler to Respimat.   Discussed sleep issues . Does have trouble sleeping.  + snoring , daytime sleepiness. We dicussed sleep study .   Has Diastolic CHF , says edema is doing good.    Allergies  Allergen Reactions  . Ambien [Zolpidem Tartrate] Other (See Comments)    Causes nightmares    Immunization History  Administered Date(s) Administered  . Influenza Split 11/12/2010  . Influenza, High Dose Seasonal PF 11/09/2012, 10/31/2015  . Influenza,inj,Quad PF,36+ Mos 10/20/2014  . Pneumococcal Conjugate-13 05/17/2013  . Pneumococcal Polysaccharide-23 03/22/2010    Past Medical History:  Diagnosis Date  .  Arrhythmia   . Bronchiectasis march 2012   On CT chest. RUL. Mild  . CHF (congestive heart failure) ([place])   . Chicken pox   . Chronic diastolic heart failure ([place])   . Chronic respiratory failure ([place])    Followed in Pulmonary clinic/ [facility]/ Ramaswamy  - 06/30/2012 desat to 86%  RA walking 50 ft, recovered to 90% at rest - 06/30/2012  Walked 1lpm x 3 laps @ 185 ft each stopped due to  Sob, no desat   rec 02 2lpm with activity and sleeping, ok at rest   . COPD (chronic obstructive pulmonary disease) ([place])     FEV-1 in 2008 was 63% with a diffusion capacity of 33%.   . Generalized anxiety disorder   . History of cervical cancer 1982  . Hyperlipidemia   . Hypertension   . Leg swelling    Venous doppler right 07/21/12 >>Neg    . 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 Medical non-compliance   . MITRAL VALVE PROLAPSE   . Obesity, unspecified   . Pulmonary nodule march 2012   24mm RUL and RLL 1st seen march 2012 CT, ?progression 12/2012 CT  . Seasonal allergies   . Tobacco abuse     Smokes one pack a day since age 59.  . Tobacco abuse     Tobacco History: History  Smoking Status  . Current Every Day Smoker  . Packs/day: 0.50  . Years: 44.00  . Types: Cigarettes  Smokeless Tobacco  . Never Used    Comment: down to 4 cigs per day plus 5 ecigs with nicotien daily//3.14.17   Ready to quit: Not Answered Counseling given: Not Answered   Outpatient Encounter Prescriptions as of 03/18/2016  Medication Sig  . albuterol (PROVENTIL) (2.5 MG/3ML) 0.083% nebulizer solution USE ONE VIAL IN NEBULIZER EVERY 6 HOURS AS NEEDED FOR WHEEZING  . ALPRAZolam (XANAX) 0.5 MG tablet TAKE 1 TABLET BY MOUTH TWICE A DAY AS NEEDED FOR ANXIETY  . budesonide-formoterol (SYMBICORT) 160-4.5 MCG/ACT inhaler Inhale 2 puffs into the lungs 2 (two) times daily.  . fluticasone (FLONASE) 50 MCG/ACT nasal spray Place 2 sprays into both nostrils daily as needed for allergies or rhinitis.  . furosemide (LASIX) 40 MG tablet Take 1 tablet (40 mg total) by mouth daily.  Marland Kitchen GLUCOCOM LANCETS 33G MISC Use with Test Strips to take blood sugars  . glucose blood (BAYER CONTOUR NEXT TEST) test strip Use to take blood sugars twice daily.  . Insulin Glargine (LANTUS SOLOSTAR) 100 UNIT/ML Solostar Pen Inject 22 Units into the skin daily at 10 pm.  . nicotine (NICODERM CQ - DOSED IN MG/24 HOURS)  21 mg/24hr patch Place 1 patch (21 mg total) onto the skin daily.  . pantoprazole (PROTONIX) 40 MG tablet Take 1 tablet (40 mg total) by mouth daily.  . predniSONE (DELTASONE) 20 MG tablet Take 1 tablet (20 mg total) by mouth daily with breakfast.  . SPIRIVA HANDIHALER 18 MCG inhalation capsule PLACE 1 CAPSULE INTO HANDIHALER AND INHALE EVERY DAY   No facility-administered encounter medications on file as of 03/18/2016.      Review of Systems  Constitutional:   No  weight loss, night sweats,  Fevers, chills, + fatigue, or  lassitude.  HEENT:   No headaches,  Difficulty swallowing,  Tooth/dental problems, or  Sore throat,                No sneezing, itching,  ear ache, nasal congestion, post nasal drip,   CV:  No chest pain,  Orthopnea, PND, swelling in lower extremities, anasarca, dizziness, palpitations, syncope.   GI  No heartburn, indigestion, abdominal pain, nausea, vomiting, diarrhea, change in bowel habits, loss of appetite, bloody stools.   Resp:No chest wall deformity  Skin: no rash or lesions.  GU: no dysuria, change in color of urine, no urgency or frequency.  No flank pain, no hematuria   MS:  No joint pain or swelling.  No decreased range of motion.  No back pain.    Physical Exam  BP 102/60 (BP Location: Left Arm, Cuff Size: Normal)   Pulse (!) 105   Ht 5\' 4"  (1.626 m)   Wt 217 lb 9.6 oz (98.7 kg)   SpO2 91%   BMI 37.35 kg/m   GEN: A/Ox3; pleasant , NAD, elderly , on O2 w/ walker    HEENT:  [place]/AT,  EACs-clear, TMs-wnl, NOSE-clear, THROAT-clear, no lesions, no postnasal drip or exudate noted.   NECK:  Supple w/ fair ROM; no JVD; normal carotid impulses w/o bruits; no thyromegaly or nodules palpated; no lymphadenopathy.    RESP  Decreased BS in bases ,  no accessory muscle use, no dullness to percussion  CARD:  RRR, no m/r/g, tr  peripheral edema, pulses intact, no cyanosis or clubbing.  GI:   Soft & nt; nml bowel sounds; no organomegaly or masses detected.    Musco: Warm bil, no deformities or joint swelling noted.   Neuro: alert, no focal deficits noted.    Skin: Warm, no lesions or rashes    Lab Results:  CBC  BMET  Imaging: Ct Chest Wo Contrast  Result Date: 02/25/2016 CLINICAL DATA:  Pneumonia and shortness of breath EXAM: CT CHEST WITHOUT CONTRAST TECHNIQUE: Multidetector CT imaging of the chest was performed following the standard protocol without IV contrast. COMPARISON:  Chest x-ray 02/23/2016, CT chest 08/09/2015, PET-CT 04/03/2015, CT chest 03/21/2015 FINDINGS: Cardiovascular: Limited without intravenous contrast. Apparent origin of right subclavian artery with retroesophageal course. Atherosclerotic calcifications of the aorta. Mild ectasia. No aneurysm. Coronary artery calcifications. Mild cardiomegaly. Small amount of pericardial effusion. Mediastinum/Nodes: Adenopathy is present within the mediastinum. AP window lymph node measures 1.2 cm compared with 1.2 cm previously. Lymph node posterior to the superior vena cava measures 1.3 cm compared with 1.5 cm previously. Small hilar nodes are grossly unchanged. Stable 8 mm hypodense nodule left lobe of thyroid. Trachea is midline. Esophagus demonstrates mild air distention but is otherwise unremarkable. Lungs/Pleura: Moderate severe emphysematous disease. Stable 5 mm pulmonary nodules within the right middle lobe an the right upper lobe. Small bilateral pleural effusions, right greater than left. Peripheral consolidations within the left greater than right lower lobes and mild consolidation in the right middle lobe. No pneumothorax. Upper Abdomen: No acute findings. Musculoskeletal: No acute or suspicious bone lesions. IMPRESSION: 1. Small bilateral pleural effusions, right greater than left with partial consolidations in the bilateral lower lobes and right middle lobes which may reflect pneumonia. 2. Moderate severe emphysematous disease as before. 3. Essentially stable mediastinal adenopathy  compared with July 2017 CT. Electronically Signed   By: Donavan Foil M.D.   On: 02/25/2016 22:22   Dg Chest Port 1 View  Result Date: 02/24/2016 CLINICAL DATA:  Follow up pneumonia. EXAM: PORTABLE CHEST 1 VIEW COMPARISON:  02/23/2016 and 02/22/2016 radiographs.  CT 08/09/2015. FINDINGS: 0655 hours. Lordotic positioning. The heart size and mediastinal contours are stable with cardiomegaly, aortic tortuosity and chronic hilar  prominence. Patient has known adenopathy on prior CT. The lungs are hyperinflated with scattered scarring. There is no focal airspace disease, edema, pleural effusion or pneumothorax. Telemetry leads overlie the chest. IMPRESSION: Chronic obstructive pulmonary disease without evidence of focal pulmonary opacity. Stable cardiomegaly and chronic hilar prominence. Electronically Signed   By: Richardean Sale M.D.   On: 02/24/2016 08:51   Dg Chest Port 1 View  Result Date: 02/23/2016 CLINICAL DATA:  Respiratory failure. EXAM: PORTABLE CHEST 1 VIEW COMPARISON:  02/22/2016 . FINDINGS: Cardiomegaly with pulmonary vascular prominence and bilateral interstitial prominence with bilateral pleural effusions. Findings again consistent with congestive heart failure. No interim change. No pneumothorax. IMPRESSION: Congestive heart failure bilateral interstitial edema and bilateral pleural effusions. Bilateral pneumonia cannot be excluded. No interim change from prior exam. Electronically Signed   By: Marcello Moores  Register   On: 02/23/2016 07:36   Dg Chest Portable 1 View  Result Date: 02/22/2016 CLINICAL DATA:  Shortness of breath tonight. EXAM: PORTABLE CHEST 1 VIEW COMPARISON:  Radiograph 01/08/2016, CT 08/09/2015 FINDINGS: The heart is enlarged, stable cardiomegaly. Worsening peribronchial thickening and interstitial opacities primarily at the bases. Emphysema with hyperinflation. Difficult to exclude left pleural effusion. No pneumothorax. IMPRESSION: Cardiomegaly. Worsening peribronchial thickening and  interstitial opacities, may be pulmonary edema or atypical infection. Difficult to exclude left pleural effusion. Electronically Signed   By: Jeb Levering M.D.   On: 02/22/2016 04:10     Assessment & Plan:   COPD (chronic obstructive pulmonary disease) COPD exacerbation , improved   Plan  Patient Instructions  Decreased  Prednisone 20mg  daily for 1 week, then 10mg  daily  Until seen back in office .  Continue on Symbicort and Spiriva , rinse after use.  Work on not smoking  Continue on Oxygen 3l/m rest and 4l/m act  Set up for sleep study .  Follow up with Dr. [name] in 1 months and As needed   Please contact office for sooner follow up if symptoms do not improve or worsen or seek emergency care       Chronic respiratory failure with hypoxia ([place]) Chronic hypercarbic/hypoxic RF on O2 ? OHS/OSA component  Cont on o2  Check sleep study .   Pneumonia of both lungs due to infectious organism Improved w/ abx  Check cxr on reutrn in 4 weeks .   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  follow up in 3-4 weeks Dr. Lyla Son, NP 03/18/2016

## 2016-03-18 NOTE — Telephone Encounter (Signed)
I will change it

## 2016-03-18 NOTE — Assessment & Plan Note (Signed)
Chronic hypercarbic/hypoxic RF on O2 ? OHS/OSA component  Cont on o2  Check sleep study .

## 2016-03-18 NOTE — Assessment & Plan Note (Signed)
Improved w/ abx  Check cxr on reutrn in 4 weeks .

## 2016-03-19 ENCOUNTER — Encounter: Payer: Self-pay | Admitting: Internal Medicine

## 2016-03-19 ENCOUNTER — Ambulatory Visit (INDEPENDENT_AMBULATORY_CARE_PROVIDER_SITE_OTHER): Payer: Medicare Other | Admitting: Internal Medicine

## 2016-03-19 ENCOUNTER — Telehealth: Payer: Self-pay | Admitting: *Deleted

## 2016-03-19 VITALS — BP 120/72 | HR 97 | Temp 97.7°F | Resp 20 | Wt 220.0 lb

## 2016-03-19 DIAGNOSIS — I10 Essential (primary) hypertension: Secondary | ICD-10-CM

## 2016-03-19 DIAGNOSIS — E538 Deficiency of other specified B group vitamins: Secondary | ICD-10-CM

## 2016-03-19 DIAGNOSIS — I110 Hypertensive heart disease with heart failure: Secondary | ICD-10-CM | POA: Diagnosis not present

## 2016-03-19 DIAGNOSIS — I5032 Chronic diastolic (congestive) heart failure: Secondary | ICD-10-CM | POA: Diagnosis not present

## 2016-03-19 DIAGNOSIS — Z794 Long term (current) use of insulin: Secondary | ICD-10-CM | POA: Diagnosis not present

## 2016-03-19 DIAGNOSIS — E119 Type 2 diabetes mellitus without complications: Secondary | ICD-10-CM

## 2016-03-19 DIAGNOSIS — F411 Generalized anxiety disorder: Secondary | ICD-10-CM | POA: Diagnosis not present

## 2016-03-19 DIAGNOSIS — F419 Anxiety disorder, unspecified: Secondary | ICD-10-CM | POA: Diagnosis not present

## 2016-03-19 DIAGNOSIS — E7800 Pure hypercholesterolemia, unspecified: Secondary | ICD-10-CM | POA: Diagnosis not present

## 2016-03-19 DIAGNOSIS — J441 Chronic obstructive pulmonary disease with (acute) exacerbation: Secondary | ICD-10-CM

## 2016-03-19 DIAGNOSIS — K219 Gastro-esophageal reflux disease without esophagitis: Secondary | ICD-10-CM | POA: Diagnosis not present

## 2016-03-19 DIAGNOSIS — E1165 Type 2 diabetes mellitus with hyperglycemia: Secondary | ICD-10-CM | POA: Diagnosis not present

## 2016-03-19 LAB — POCT GLYCOSYLATED HEMOGLOBIN (HGB A1C): HEMOGLOBIN A1C: 7.9

## 2016-03-19 MED ORDER — CYANOCOBALAMIN 1000 MCG/ML IJ SOLN
1000.0000 ug | Freq: Once | INTRAMUSCULAR | Status: AC
  Administered 2016-03-19: 1000 ug via INTRAMUSCULAR

## 2016-03-19 NOTE — Progress Notes (Signed)
Subjective:    Patient ID: [name], female    DOB: 1945-10-27, 71 y.o.   MRN: [id number]  HPI The patient is here for follow up from the hospital.  She was admitted 02/22/16-03/01/16 for COPD exacerbation.  She presented to the emergency room with 1 week of cough productive of yellow phlegm and shortness of breath. In the emergency room she received a continuous neb treatment, IV Solu-Medrol, antibiotics and was placed on BiPAP. She was diagnosed with a COPD exacerbation +/- pneumonia, related to continued tobacco abuse and noncompliance/malfunction with oxygen at home. Viral panel was negative. She is on chronic prednisone and has a history of mediastinal adenopathy and possibly lymphoma. he has been able to be done due to her poor respiratory status. She was fluid overloaded related to diastolic dysfunction and was aggressively diuresed.  CT chest showed small bilateral pleural effusions, right greater than left, with partial consolidations in the bilateral lobes and right middle lobes. Moderate severe emphysema, stable mediastinal adenopathy compared to July 2017  Quit smoking for short period, but has restarted.  Working on quitting again.  She has already followed up with pulmonary. She remains on home oxygen 6 L/m.  Her prednisone decreased I pulmonary and she will then follow-up with them. She is using her Symbicort and Spiriva.  Pulmonary has ranged a follow-up chest x-ray for her pneumonia. Her symptoms have improved with the antibiotics.  She has felt better since leaving the hospital.  She still feels like she is not getting enough air.  Mornings are difficul - it takes her a while to get herself together.    Diabetes: She is taking her medication daily as prescribed. She is compliant with a diabetic diet. She is not exercising regularly. She monitors her sugars and they have been running around 110, 220 after meals. She checks her feet daily and denies foot lesions. She is  up-to-date with an ophthalmology examination.   Anxiety: She is taking her medication daily as prescribed. She denies any side effects from the medication. She feels her anxiety is well controlled and she is happy with her current dose of medication.    Medications and allergies reviewed with patient and updated if appropriate.  Patient Active Problem List   Diagnosis Date Noted  . Pneumonia of both lungs due to infectious organism   . Acute kidney injury ([place])   . Chronic respiratory failure with hypoxia ([place])   . Pneumonia of both lower lobes due to infectious organism   . Chronic diastolic congestive heart failure ([place])   . Chronic diastolic CHF (congestive heart failure) ([place])   . Pulmonary hypertension   . Uncontrolled type 2 diabetes mellitus with complication ([place])   . COPD exacerbation ([place]) 02/22/2016  . Acute on chronic respiratory failure with hypoxia and hypercapnia ([place]) 02/22/2016  . Physical deconditioning 11/15/2015  . GERD (gastroesophageal reflux disease) 11/01/2015  . 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 Diabetes ([place]) 01/23/2015  . Fatigue 01/20/2015  . COLD (chronic obstructive lung disease) ([place]) 10/18/2014  . Generalized anxiety disorder 03/15/2013  . Obesity, unspecified 03/15/2013  . Chronic diastolic heart failure ([place]) 03/14/2013  . Chronic respiratory failure ([place]) 06/30/2012  . COPD (chronic obstructive pulmonary disease) ([place])   . Tobacco abuse   . Pulmonary nodule 03/22/2010  . 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 Bronchiectasis 03/22/2010  . Hyperlipidemia 06/13/2008  . Essential hypertension 03/06/2007  . ADJUSTMENT DISORDER WITH ANXIETY 02/20/2007  . MITRAL VALVE PROLAPSE 02/20/2007  . CERVICAL  CANCER, HX OF 02/20/2007    Current Outpatient Prescriptions on File Prior to Visit  Medication Sig Dispense Refill  . albuterol (PROVENTIL HFA;VENTOLIN HFA) 108 (90 Base) MCG/ACT inhaler Inhale 2 puffs into the lungs every 6 (six) hours as needed for  wheezing or shortness of breath. 1 Inhaler 5  . albuterol (PROVENTIL) (2.5 MG/3ML) 0.083% nebulizer solution USE ONE VIAL IN NEBULIZER EVERY 6 HOURS AS NEEDED FOR WHEEZING 75 mL 11  . ALPRAZolam (XANAX) 0.5 MG tablet TAKE 1 TABLET BY MOUTH TWICE A DAY AS NEEDED FOR ANXIETY 60 tablet 2  . budesonide-formoterol (SYMBICORT) 160-4.5 MCG/ACT inhaler Inhale 2 puffs into the lungs 2 (two) times daily. 3 Inhaler 2  . fluticasone (FLONASE) 50 MCG/ACT nasal spray Place 2 sprays into both nostrils daily as needed for allergies or rhinitis. 16 g 11  . furosemide (LASIX) 40 MG tablet Take 1 tablet (40 mg total) by mouth daily. 30 tablet 0  . GLUCOCOM LANCETS 33G MISC Use with Test Strips to take blood sugars 100 each 5  . glucose blood (BAYER CONTOUR NEXT TEST) test strip Use to take blood sugars twice daily. 100 each 3  . Insulin Glargine (LANTUS SOLOSTAR) 100 UNIT/ML Solostar Pen Inject 22 Units into the skin daily at 10 pm. 5 pen 11  . nicotine (NICODERM CQ - DOSED IN MG/24 HOURS) 21 mg/24hr patch Place 1 patch (21 mg total) onto the skin daily. 28 patch 0  . pantoprazole (PROTONIX) 40 MG tablet Take 1 tablet (40 mg total) by mouth daily. 30 tablet 0  . predniSONE (DELTASONE) 20 MG tablet Take 1 tablet (20 mg total) by mouth daily with breakfast. 21 tablet 0  . Tiotropium Bromide Monohydrate (SPIRIVA RESPIMAT) 2.5 MCG/ACT AERS Inhale 2 puffs into the lungs daily. 4 g 5  . Tiotropium Bromide Monohydrate (SPIRIVA RESPIMAT) 2.5 MCG/ACT AERS Inhale 2 puffs into the lungs daily. 1 Inhaler 0   No current facility-administered medications on file prior to visit.     Past Medical History:  Diagnosis Date  . Arrhythmia   . Bronchiectasis march 2012   On CT chest. RUL. Mild  . CHF (congestive heart failure) ([place])   . Chicken pox   . Chronic diastolic heart failure ([place])   . Chronic respiratory failure ([place])    Followed in Pulmonary clinic/  [facility]/ [facility]aswamy  - 06/30/2012 desat to 86%  RA walking 50  ft, recovered to 90% at rest - 06/30/2012  Walked 1lpm x 3 laps @ 185 ft each stopped due to  Sob, no desat  rec 02 2lpm with activity and sleeping, ok at rest   . COPD (chronic obstructive pulmonary disease) ([place])     FEV-1 in 2008 was 63% with a diffusion capacity of 33%.   . Generalized anxiety disorder   . History of cervical cancer 1982  . Hyperlipidemia   . Hypertension   . Leg swelling    Venous doppler right 07/21/12 >>Neg    . 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 Medical non-compliance   . MITRAL VALVE PROLAPSE   . Obesity, unspecified   . Pulmonary nodule march 2012   54mm RUL and RLL 1st seen march 2012 CT, ?progression 12/2012 CT  . Seasonal allergies   . Tobacco abuse     Smokes one pack a day since age 34.  . Tobacco abuse     Past Surgical History:  Procedure Laterality Date  .  TOTAL ABDOMINAL HYSTERECTOMY      Social History   Social History  . Marital status: Divorced    Spouse name: N/A  . Number of children: N/A  . Years of education: N/A   Occupational History  . works at a call center    Social History Main Topics  . Smoking status: Current Every Day Smoker    Packs/day: 0.50    Years: 44.00    Types: Cigarettes  . Smokeless tobacco: Never Used     Comment: down to 4 cigs per day plus 5 ecigs with nicotien daily//3.14.17  . Alcohol use 0.0 oz/week     Comment: occasional  . Drug use: No  . Sexual activity: Not on file   Other Topics Concern  . Not on file   Social History Narrative   Married but recently separated from husband    Family History  Problem Relation Age of Onset  . Other Father     MVA  . Esophageal cancer Mother     Review of Systems  Constitutional: Negative for appetite change and fever.  Respiratory: Positive for shortness of breath. Negative for cough and wheezing.   Cardiovascular: Negative for chest pain, palpitations and leg swelling.  Neurological: Positive for headaches  (occ). Negative for light-headedness.  Psychiatric/Behavioral: Negative for sleep disturbance.       Objective:   Vitals:   03/19/16 1303  BP: 120/72  Pulse: 97  Resp: 20  Temp: 97.7 F (36.5 C)   Wt Readings from Last 3 Encounters:  03/19/16 220 lb (99.8 kg)  03/18/16 217 lb 9.6 oz (98.7 kg)  03/01/16 214 lb (97.1 kg)   Body mass index is 37.76 kg/m.   Physical Exam    Constitutional: Appears well-developed and well-nourished. No distress.  HENT:  Head: Normocephalic and atraumatic.  Neck: Neck supple. No tracheal deviation present. No thyromegaly present.  No cervical lymphadenopathy Cardiovascular: Normal rate, regular rhythm and normal heart sounds.   No murmur heard. No carotid bruit .  No edema Pulmonary/Chest: Effort normal and breath sounds diffusely decreased from severe COPD. No respiratory distress. No has no wheezes. No rales.  Skin: Skin is warm and dry. Not diaphoretic.  Psychiatric: Normal mood and affect. Behavior is normal.      Assessment & Plan:   B12 injection today for B12 def  See Problem List for Assessment and Plan of chronic medical problems.

## 2016-03-19 NOTE — Assessment & Plan Note (Addendum)
Recent hospitalization for COPD exac and chronic resp failure Completed antibiotics, on 6 L/m of oxygen - there has been some confusion regarding how much oxygen she should be on -- she came in today on 4L/min and her Oxygen sat was 71% - advised her to go up to 6 L/m with activity and discuss further with pulm Feeling better - exacerbation improving - will be decreasing prednisone per pulmonary Working on quitting smoking

## 2016-03-19 NOTE — Patient Instructions (Signed)
Your A1c was checked.   B12 injection administered today.   Medications reviewed and updated.    No changes recommended at this time.   Please followup in 3 months, sooner if needed

## 2016-03-19 NOTE — Addendum Note (Signed)
Addended by: Terence Lux B on: 03/19/2016 02:18 PM   Modules accepted: Orders

## 2016-03-19 NOTE — Telephone Encounter (Signed)
ok 

## 2016-03-19 NOTE — Assessment & Plan Note (Signed)
Check a1c today - will not adjust lantus because she is going to decrease her prednisone dose soon Continue to monitor sugars at home - stressed calling if sugars are not controlled at home so we can adjust insulin

## 2016-03-19 NOTE — Progress Notes (Signed)
Pre visit review using our clinic review tool, if applicable. No additional management support is needed unless otherwise documented below in the visit note. 

## 2016-03-19 NOTE — Assessment & Plan Note (Signed)
Controlled today - only on Lasix No new meds needed

## 2016-03-19 NOTE — Telephone Encounter (Signed)
Rec'd call from PT requesting continuation services for HH/PT twice a week for 2 weeks, and then 1 x a week for 2 week. Pt has shown some progress but needing little more time...[name]

## 2016-03-19 NOTE — Assessment & Plan Note (Signed)
Controlled, stable Continue current dose of medication  

## 2016-03-19 NOTE — Assessment & Plan Note (Signed)
Taken off statin in hospital

## 2016-03-19 NOTE — Telephone Encounter (Signed)
Called [name] no answer LMOM w/MD response...[name]

## 2016-03-20 DIAGNOSIS — E669 Obesity, unspecified: Secondary | ICD-10-CM | POA: Diagnosis not present

## 2016-03-20 DIAGNOSIS — Z794 Long term (current) use of insulin: Secondary | ICD-10-CM | POA: Diagnosis not present

## 2016-03-20 DIAGNOSIS — J441 Chronic obstructive pulmonary disease with (acute) exacerbation: Secondary | ICD-10-CM | POA: Diagnosis not present

## 2016-03-20 DIAGNOSIS — K219 Gastro-esophageal reflux disease without esophagitis: Secondary | ICD-10-CM | POA: Diagnosis not present

## 2016-03-20 DIAGNOSIS — E1165 Type 2 diabetes mellitus with hyperglycemia: Secondary | ICD-10-CM | POA: Diagnosis not present

## 2016-03-20 DIAGNOSIS — Z8701 Personal history of pneumonia (recurrent): Secondary | ICD-10-CM | POA: Diagnosis not present

## 2016-03-20 DIAGNOSIS — I110 Hypertensive heart disease with heart failure: Secondary | ICD-10-CM | POA: Diagnosis not present

## 2016-03-20 DIAGNOSIS — Z720 Tobacco use: Secondary | ICD-10-CM | POA: Diagnosis not present

## 2016-03-20 DIAGNOSIS — F419 Anxiety disorder, unspecified: Secondary | ICD-10-CM | POA: Diagnosis not present

## 2016-03-20 DIAGNOSIS — E785 Hyperlipidemia, unspecified: Secondary | ICD-10-CM | POA: Diagnosis not present

## 2016-03-20 DIAGNOSIS — Z8542 Personal history of malignant neoplasm of other parts of uterus: Secondary | ICD-10-CM | POA: Diagnosis not present

## 2016-03-20 DIAGNOSIS — I5032 Chronic diastolic (congestive) heart failure: Secondary | ICD-10-CM | POA: Diagnosis not present

## 2016-03-20 DIAGNOSIS — Z9981 Dependence on supplemental oxygen: Secondary | ICD-10-CM | POA: Diagnosis not present

## 2016-03-21 ENCOUNTER — Telehealth: Payer: Self-pay | Admitting: Internal Medicine

## 2016-03-21 DIAGNOSIS — K219 Gastro-esophageal reflux disease without esophagitis: Secondary | ICD-10-CM | POA: Diagnosis not present

## 2016-03-21 DIAGNOSIS — F419 Anxiety disorder, unspecified: Secondary | ICD-10-CM | POA: Diagnosis not present

## 2016-03-21 DIAGNOSIS — E1165 Type 2 diabetes mellitus with hyperglycemia: Secondary | ICD-10-CM | POA: Diagnosis not present

## 2016-03-21 DIAGNOSIS — J441 Chronic obstructive pulmonary disease with (acute) exacerbation: Secondary | ICD-10-CM | POA: Diagnosis not present

## 2016-03-21 DIAGNOSIS — I110 Hypertensive heart disease with heart failure: Secondary | ICD-10-CM | POA: Diagnosis not present

## 2016-03-21 DIAGNOSIS — I5032 Chronic diastolic (congestive) heart failure: Secondary | ICD-10-CM | POA: Diagnosis not present

## 2016-03-21 NOTE — Telephone Encounter (Signed)
Verbal Orders: Skilled nursing visits for  1 a week for 9 weeks [phone] - Selinda Orion

## 2016-03-22 DIAGNOSIS — I110 Hypertensive heart disease with heart failure: Secondary | ICD-10-CM | POA: Diagnosis not present

## 2016-03-22 DIAGNOSIS — I5032 Chronic diastolic (congestive) heart failure: Secondary | ICD-10-CM | POA: Diagnosis not present

## 2016-03-22 DIAGNOSIS — J441 Chronic obstructive pulmonary disease with (acute) exacerbation: Secondary | ICD-10-CM | POA: Diagnosis not present

## 2016-03-22 DIAGNOSIS — F419 Anxiety disorder, unspecified: Secondary | ICD-10-CM | POA: Diagnosis not present

## 2016-03-22 DIAGNOSIS — K219 Gastro-esophageal reflux disease without esophagitis: Secondary | ICD-10-CM | POA: Diagnosis not present

## 2016-03-22 DIAGNOSIS — E1165 Type 2 diabetes mellitus with hyperglycemia: Secondary | ICD-10-CM | POA: Diagnosis not present

## 2016-03-22 NOTE — Telephone Encounter (Signed)
Notified keria w.MD response...[name]

## 2016-03-22 NOTE — Telephone Encounter (Signed)
Okay to call in under Dr. Quay Burow

## 2016-03-22 NOTE — Telephone Encounter (Signed)
MD out of office pls advise.../lmb 

## 2016-03-23 ENCOUNTER — Other Ambulatory Visit: Payer: Self-pay | Admitting: Internal Medicine

## 2016-03-28 DIAGNOSIS — F419 Anxiety disorder, unspecified: Secondary | ICD-10-CM | POA: Diagnosis not present

## 2016-03-28 DIAGNOSIS — I5032 Chronic diastolic (congestive) heart failure: Secondary | ICD-10-CM | POA: Diagnosis not present

## 2016-03-28 DIAGNOSIS — K219 Gastro-esophageal reflux disease without esophagitis: Secondary | ICD-10-CM | POA: Diagnosis not present

## 2016-03-28 DIAGNOSIS — J441 Chronic obstructive pulmonary disease with (acute) exacerbation: Secondary | ICD-10-CM | POA: Diagnosis not present

## 2016-03-28 DIAGNOSIS — E1165 Type 2 diabetes mellitus with hyperglycemia: Secondary | ICD-10-CM | POA: Diagnosis not present

## 2016-03-28 DIAGNOSIS — I110 Hypertensive heart disease with heart failure: Secondary | ICD-10-CM | POA: Diagnosis not present

## 2016-03-29 ENCOUNTER — Telehealth: Payer: Self-pay | Admitting: Internal Medicine

## 2016-03-29 DIAGNOSIS — K219 Gastro-esophageal reflux disease without esophagitis: Secondary | ICD-10-CM | POA: Diagnosis not present

## 2016-03-29 DIAGNOSIS — J441 Chronic obstructive pulmonary disease with (acute) exacerbation: Secondary | ICD-10-CM | POA: Diagnosis not present

## 2016-03-29 DIAGNOSIS — E1165 Type 2 diabetes mellitus with hyperglycemia: Secondary | ICD-10-CM | POA: Diagnosis not present

## 2016-03-29 DIAGNOSIS — I5032 Chronic diastolic (congestive) heart failure: Secondary | ICD-10-CM | POA: Diagnosis not present

## 2016-03-29 DIAGNOSIS — F419 Anxiety disorder, unspecified: Secondary | ICD-10-CM | POA: Diagnosis not present

## 2016-03-29 DIAGNOSIS — I110 Hypertensive heart disease with heart failure: Secondary | ICD-10-CM | POA: Diagnosis not present

## 2016-03-29 NOTE — Telephone Encounter (Signed)
Increase prednisone back to 20mg  per day However if there is concern like this on a Friday PM - go to ER  Dr. Brand Males, M.D., [facility].C.P Pulmonary and Critical Care Medicine Staff Physician [place] [facility] Pager: [phone], If no answer or between  15:00h - 7:00h: call [phone]  03/29/2016 2:19 PM

## 2016-03-29 NOTE — Telephone Encounter (Signed)
Spoke with [name] with [facility]. States that pt is diminished. When [place] went to see the pt today, the pt's oxygen level was at 80% on 6L. It took the pt awhile to recover back to 88-92% on 6L. Pt is currently taking Prednisone 10mg  daily. This was decreased at her last OV with TP on 03/18/16. It was very hard to get much information out of [place].  MR - please advise. Thanks.

## 2016-03-29 NOTE — Telephone Encounter (Signed)
Spoke with Selinda Orion at [facility], aware of recs.  Nothing further needed.

## 2016-04-02 ENCOUNTER — Telehealth: Payer: Self-pay | Admitting: Internal Medicine

## 2016-04-02 DIAGNOSIS — I5032 Chronic diastolic (congestive) heart failure: Secondary | ICD-10-CM | POA: Diagnosis not present

## 2016-04-02 DIAGNOSIS — K219 Gastro-esophageal reflux disease without esophagitis: Secondary | ICD-10-CM | POA: Diagnosis not present

## 2016-04-02 DIAGNOSIS — F419 Anxiety disorder, unspecified: Secondary | ICD-10-CM | POA: Diagnosis not present

## 2016-04-02 DIAGNOSIS — I110 Hypertensive heart disease with heart failure: Secondary | ICD-10-CM | POA: Diagnosis not present

## 2016-04-02 DIAGNOSIS — E1165 Type 2 diabetes mellitus with hyperglycemia: Secondary | ICD-10-CM | POA: Diagnosis not present

## 2016-04-02 DIAGNOSIS — J441 Chronic obstructive pulmonary disease with (acute) exacerbation: Secondary | ICD-10-CM | POA: Diagnosis not present

## 2016-04-02 NOTE — Telephone Encounter (Signed)
M[facility] with [facility]  [phone]   Need verbals for  Pt wants to speak to someone about her eatting habits,

## 2016-04-02 NOTE — Telephone Encounter (Signed)
Spoke with Merry Proud to give verbal orders per MD for a nutrtionist.

## 2016-04-03 DIAGNOSIS — K219 Gastro-esophageal reflux disease without esophagitis: Secondary | ICD-10-CM | POA: Diagnosis not present

## 2016-04-03 DIAGNOSIS — F419 Anxiety disorder, unspecified: Secondary | ICD-10-CM | POA: Diagnosis not present

## 2016-04-03 DIAGNOSIS — E669 Obesity, unspecified: Secondary | ICD-10-CM

## 2016-04-03 DIAGNOSIS — J441 Chronic obstructive pulmonary disease with (acute) exacerbation: Secondary | ICD-10-CM | POA: Diagnosis not present

## 2016-04-03 DIAGNOSIS — I10 Essential (primary) hypertension: Secondary | ICD-10-CM | POA: Diagnosis not present

## 2016-04-03 DIAGNOSIS — Z9981 Dependence on supplemental oxygen: Secondary | ICD-10-CM | POA: Diagnosis not present

## 2016-04-03 DIAGNOSIS — E785 Hyperlipidemia, unspecified: Secondary | ICD-10-CM | POA: Diagnosis not present

## 2016-04-03 DIAGNOSIS — E1165 Type 2 diabetes mellitus with hyperglycemia: Secondary | ICD-10-CM | POA: Diagnosis not present

## 2016-04-03 DIAGNOSIS — Z8542 Personal history of malignant neoplasm of other parts of uterus: Secondary | ICD-10-CM | POA: Diagnosis not present

## 2016-04-03 DIAGNOSIS — I5032 Chronic diastolic (congestive) heart failure: Secondary | ICD-10-CM | POA: Diagnosis not present

## 2016-04-03 DIAGNOSIS — Z794 Long term (current) use of insulin: Secondary | ICD-10-CM | POA: Diagnosis not present

## 2016-04-04 DIAGNOSIS — E1165 Type 2 diabetes mellitus with hyperglycemia: Secondary | ICD-10-CM | POA: Diagnosis not present

## 2016-04-04 DIAGNOSIS — I110 Hypertensive heart disease with heart failure: Secondary | ICD-10-CM | POA: Diagnosis not present

## 2016-04-04 DIAGNOSIS — F419 Anxiety disorder, unspecified: Secondary | ICD-10-CM | POA: Diagnosis not present

## 2016-04-04 DIAGNOSIS — I5032 Chronic diastolic (congestive) heart failure: Secondary | ICD-10-CM | POA: Diagnosis not present

## 2016-04-04 DIAGNOSIS — K219 Gastro-esophageal reflux disease without esophagitis: Secondary | ICD-10-CM | POA: Diagnosis not present

## 2016-04-04 DIAGNOSIS — J441 Chronic obstructive pulmonary disease with (acute) exacerbation: Secondary | ICD-10-CM | POA: Diagnosis not present

## 2016-04-05 DIAGNOSIS — K219 Gastro-esophageal reflux disease without esophagitis: Secondary | ICD-10-CM | POA: Diagnosis not present

## 2016-04-05 DIAGNOSIS — F419 Anxiety disorder, unspecified: Secondary | ICD-10-CM | POA: Diagnosis not present

## 2016-04-05 DIAGNOSIS — I110 Hypertensive heart disease with heart failure: Secondary | ICD-10-CM | POA: Diagnosis not present

## 2016-04-05 DIAGNOSIS — I5032 Chronic diastolic (congestive) heart failure: Secondary | ICD-10-CM | POA: Diagnosis not present

## 2016-04-05 DIAGNOSIS — E1165 Type 2 diabetes mellitus with hyperglycemia: Secondary | ICD-10-CM | POA: Diagnosis not present

## 2016-04-05 DIAGNOSIS — J441 Chronic obstructive pulmonary disease with (acute) exacerbation: Secondary | ICD-10-CM | POA: Diagnosis not present

## 2016-04-11 ENCOUNTER — Telehealth: Payer: Self-pay | Admitting: Internal Medicine

## 2016-04-11 DIAGNOSIS — J441 Chronic obstructive pulmonary disease with (acute) exacerbation: Secondary | ICD-10-CM | POA: Diagnosis not present

## 2016-04-11 DIAGNOSIS — K219 Gastro-esophageal reflux disease without esophagitis: Secondary | ICD-10-CM | POA: Diagnosis not present

## 2016-04-11 DIAGNOSIS — I5032 Chronic diastolic (congestive) heart failure: Secondary | ICD-10-CM | POA: Diagnosis not present

## 2016-04-11 DIAGNOSIS — E1165 Type 2 diabetes mellitus with hyperglycemia: Secondary | ICD-10-CM | POA: Diagnosis not present

## 2016-04-11 DIAGNOSIS — F419 Anxiety disorder, unspecified: Secondary | ICD-10-CM | POA: Diagnosis not present

## 2016-04-11 DIAGNOSIS — I110 Hypertensive heart disease with heart failure: Secondary | ICD-10-CM | POA: Diagnosis not present

## 2016-04-11 MED ORDER — PREDNISONE 10 MG PO TABS: ORAL_TABLET | ORAL | 0 refills | Status: DC

## 2016-04-11 MED ORDER — AZITHROMYCIN 250 MG PO TABS: ORAL_TABLET | ORAL | 0 refills | Status: DC

## 2016-04-11 NOTE — Telephone Encounter (Signed)
She follows with pulmonary and should be contacting them

## 2016-04-11 NOTE — Telephone Encounter (Signed)
Please advise 

## 2016-04-11 NOTE — Telephone Encounter (Signed)
[name]- [phone]  Resting oxygen is at  74 Pt is on 6 liter per min  Taking all med as prescribed  No wheezing and not cough  No swelling in legs  Put she is concern about her oxgen saturation and shortness of breath

## 2016-04-11 NOTE — Telephone Encounter (Signed)
Per MR: Increase prednisone 40mg  x3days, 30mg  x3 days, 20mg  x3 days and then back down to 10mg  daily maintenance dose.  Zpak #1 take as directed, no refills.  Thanks.  Called spoke with patient and informed her of MR's recommendations above.  Pt okay with this and voiced her understanding.  Rx sent to verified pharmacy.  Pt did mention that her [facility] home health nurse told her that she needs to have an "emergency pack" on hand of abx and prednisone.  Pt stated that she told the nurse that MR had never mentioned this before.  Did inform patient that our providers don't typically use that practice because we like to know when our patients are having exacerbations, etc but will forward to MR to see if this would be appropriate for pt.  MR please advise, thank you.

## 2016-04-11 NOTE — Telephone Encounter (Signed)
Pt c/o sinus congestion, PND, sob, prod cough with beige colored mucus.  S/s present X2-3 days.  Denies fever, chest pain, chills.   Pt has taken robitussin cold and sinus as well as maintenance meds to help with s/s. Requesting further recs.    Pt uses [facility] on [address].    MR please advise on further recs.  Thanks!

## 2016-04-12 ENCOUNTER — Telehealth: Payer: Self-pay | Admitting: Internal Medicine

## 2016-04-12 DIAGNOSIS — K219 Gastro-esophageal reflux disease without esophagitis: Secondary | ICD-10-CM | POA: Diagnosis not present

## 2016-04-12 DIAGNOSIS — E1165 Type 2 diabetes mellitus with hyperglycemia: Secondary | ICD-10-CM | POA: Diagnosis not present

## 2016-04-12 DIAGNOSIS — I110 Hypertensive heart disease with heart failure: Secondary | ICD-10-CM | POA: Diagnosis not present

## 2016-04-12 DIAGNOSIS — I5032 Chronic diastolic (congestive) heart failure: Secondary | ICD-10-CM | POA: Diagnosis not present

## 2016-04-12 DIAGNOSIS — F419 Anxiety disorder, unspecified: Secondary | ICD-10-CM | POA: Diagnosis not present

## 2016-04-12 DIAGNOSIS — J441 Chronic obstructive pulmonary disease with (acute) exacerbation: Secondary | ICD-10-CM | POA: Diagnosis not present

## 2016-04-12 NOTE — Telephone Encounter (Signed)
No rhonchi, no rales but slightly diminished. She wanted to give update in case we wanted to see the patient before her scheduled appt on 05/09/2016.

## 2016-04-12 NOTE — Telephone Encounter (Signed)
Dr. Nita Sickle from [facility] called to just give you an update on this pt. Please see her message below. She did not know if you wanted to see pt sooner than 05/09/16.    [name] - [facility] RN  to [name]       04/12/16 8:52 AM  Calling to advise patient reporting worsening dyspnea last 4-5 days. Initially O2 sats were 84-86 at rest, with deep breathing it was up to 90. Highest she could get without deep breating was 87%. Weight is stable. No edema in lower ext, lungs clear.

## 2016-04-12 NOTE — Telephone Encounter (Signed)
LVM with Madaline Savage with MDs response.

## 2016-04-12 NOTE — Telephone Encounter (Signed)
lmomtcb x 1 for [employer]

## 2016-04-15 NOTE — Telephone Encounter (Signed)
MR please advise thanks  

## 2016-04-16 NOTE — Telephone Encounter (Signed)
See other message open for 04/12/16 Nothing further needed.

## 2016-04-16 NOTE — Telephone Encounter (Signed)
Pt states that she is feeling a lot better since being on abx (zpak) and prednisone. Pt is supposed to complete the abx today 04/16/16. Pt states that she feels that she can wait for her scheduled appt with MR on 05/09/16.  Will send to Mr as Juluis Rainier.

## 2016-04-16 NOTE — Telephone Encounter (Addendum)
LM for patient x 1 See other open message as well

## 2016-04-16 NOTE — Telephone Encounter (Signed)
LM x 1  See other open message as well

## 2016-04-16 NOTE — Telephone Encounter (Signed)
Work in to see an APP   Dr. Brand Males, M.D., [facility].C.P Pulmonary and Critical Care Medicine Staff Physician [place] [facility] Pager: [phone], If no answer or between  15:00h - 7:00h: call [phone]  04/16/2016 1:10 AM

## 2016-04-16 NOTE — Telephone Encounter (Signed)
Emergency pack of the recent z pak and prednisone fine. Yes, you are correct we do not give it unless patient is traveling because we have 24/7 phone coverage and like to know when there is aecopd  Dr. Brand Males, M.D., [facility].C.P Pulmonary and Critical Care Medicine Staff Physician [place] [facility] Pager: [phone], If no answer or between  15:00h - 7:00h: call [phone]  04/16/2016 1:12 AM

## 2016-04-17 DIAGNOSIS — I110 Hypertensive heart disease with heart failure: Secondary | ICD-10-CM | POA: Diagnosis not present

## 2016-04-17 DIAGNOSIS — I5032 Chronic diastolic (congestive) heart failure: Secondary | ICD-10-CM | POA: Diagnosis not present

## 2016-04-17 DIAGNOSIS — J441 Chronic obstructive pulmonary disease with (acute) exacerbation: Secondary | ICD-10-CM | POA: Diagnosis not present

## 2016-04-17 DIAGNOSIS — E1165 Type 2 diabetes mellitus with hyperglycemia: Secondary | ICD-10-CM | POA: Diagnosis not present

## 2016-04-17 DIAGNOSIS — K219 Gastro-esophageal reflux disease without esophagitis: Secondary | ICD-10-CM | POA: Diagnosis not present

## 2016-04-17 DIAGNOSIS — F419 Anxiety disorder, unspecified: Secondary | ICD-10-CM | POA: Diagnosis not present

## 2016-04-18 DIAGNOSIS — E1165 Type 2 diabetes mellitus with hyperglycemia: Secondary | ICD-10-CM | POA: Diagnosis not present

## 2016-04-18 DIAGNOSIS — I5032 Chronic diastolic (congestive) heart failure: Secondary | ICD-10-CM | POA: Diagnosis not present

## 2016-04-18 DIAGNOSIS — K219 Gastro-esophageal reflux disease without esophagitis: Secondary | ICD-10-CM | POA: Diagnosis not present

## 2016-04-18 DIAGNOSIS — I110 Hypertensive heart disease with heart failure: Secondary | ICD-10-CM | POA: Diagnosis not present

## 2016-04-18 DIAGNOSIS — J441 Chronic obstructive pulmonary disease with (acute) exacerbation: Secondary | ICD-10-CM | POA: Diagnosis not present

## 2016-04-18 DIAGNOSIS — F419 Anxiety disorder, unspecified: Secondary | ICD-10-CM | POA: Diagnosis not present

## 2016-04-19 ENCOUNTER — Other Ambulatory Visit: Payer: Self-pay | Admitting: Internal Medicine

## 2016-04-22 NOTE — Telephone Encounter (Signed)
Not on current med list, please advise

## 2016-04-22 NOTE — Telephone Encounter (Signed)
No longer taking

## 2016-04-25 DIAGNOSIS — E1165 Type 2 diabetes mellitus with hyperglycemia: Secondary | ICD-10-CM | POA: Diagnosis not present

## 2016-04-25 DIAGNOSIS — K219 Gastro-esophageal reflux disease without esophagitis: Secondary | ICD-10-CM | POA: Diagnosis not present

## 2016-04-25 DIAGNOSIS — F419 Anxiety disorder, unspecified: Secondary | ICD-10-CM | POA: Diagnosis not present

## 2016-04-25 DIAGNOSIS — I110 Hypertensive heart disease with heart failure: Secondary | ICD-10-CM | POA: Diagnosis not present

## 2016-04-25 DIAGNOSIS — I5032 Chronic diastolic (congestive) heart failure: Secondary | ICD-10-CM | POA: Diagnosis not present

## 2016-04-25 DIAGNOSIS — J441 Chronic obstructive pulmonary disease with (acute) exacerbation: Secondary | ICD-10-CM | POA: Diagnosis not present

## 2016-05-01 DIAGNOSIS — I110 Hypertensive heart disease with heart failure: Secondary | ICD-10-CM | POA: Diagnosis not present

## 2016-05-01 DIAGNOSIS — K219 Gastro-esophageal reflux disease without esophagitis: Secondary | ICD-10-CM | POA: Diagnosis not present

## 2016-05-01 DIAGNOSIS — J441 Chronic obstructive pulmonary disease with (acute) exacerbation: Secondary | ICD-10-CM | POA: Diagnosis not present

## 2016-05-01 DIAGNOSIS — F419 Anxiety disorder, unspecified: Secondary | ICD-10-CM | POA: Diagnosis not present

## 2016-05-01 DIAGNOSIS — I5032 Chronic diastolic (congestive) heart failure: Secondary | ICD-10-CM | POA: Diagnosis not present

## 2016-05-01 DIAGNOSIS — E1165 Type 2 diabetes mellitus with hyperglycemia: Secondary | ICD-10-CM | POA: Diagnosis not present

## 2016-05-02 ENCOUNTER — Telehealth: Payer: Self-pay | Admitting: Internal Medicine

## 2016-05-02 NOTE — Telephone Encounter (Signed)
Called and spoke to pt. Informed her of the recs per RB. Pt verbalized understanding and denied any further questions or concerns at this time.   

## 2016-05-02 NOTE — Telephone Encounter (Signed)
Spoke with pt, who reports of increased sob, nasal congestion, mild prod cough with white mucus mainly in the morning & sneezing x2d. Pt taken flonase with mild relief.  RB please advise, as MR is night float. Thanks.   Allergies  Allergen Reactions  . Ambien [Zolpidem Tartrate] Other (See Comments)    Causes nightmares

## 2016-05-02 NOTE — Telephone Encounter (Signed)
Have her continue the nasal spray, consider increasing to 2 sprays each side twice a day (don't take it right before bedtime or it will irritate her throat) Start loratadine 10mg  daily Can consider using benadryl 25mg  as needed for any breakthrough allergy symptoms

## 2016-05-06 ENCOUNTER — Encounter (HOSPITAL_BASED_OUTPATIENT_CLINIC_OR_DEPARTMENT_OTHER): Payer: Medicare Other

## 2016-05-07 ENCOUNTER — Other Ambulatory Visit: Payer: Self-pay | Admitting: Internal Medicine

## 2016-05-09 ENCOUNTER — Ambulatory Visit: Payer: Medicare Other | Admitting: Internal Medicine

## 2016-05-09 ENCOUNTER — Ambulatory Visit: Payer: Medicare Other

## 2016-05-09 DIAGNOSIS — K219 Gastro-esophageal reflux disease without esophagitis: Secondary | ICD-10-CM | POA: Diagnosis not present

## 2016-05-09 DIAGNOSIS — I110 Hypertensive heart disease with heart failure: Secondary | ICD-10-CM | POA: Diagnosis not present

## 2016-05-09 DIAGNOSIS — F419 Anxiety disorder, unspecified: Secondary | ICD-10-CM | POA: Diagnosis not present

## 2016-05-09 DIAGNOSIS — I5032 Chronic diastolic (congestive) heart failure: Secondary | ICD-10-CM | POA: Diagnosis not present

## 2016-05-09 DIAGNOSIS — E1165 Type 2 diabetes mellitus with hyperglycemia: Secondary | ICD-10-CM | POA: Diagnosis not present

## 2016-05-09 DIAGNOSIS — J441 Chronic obstructive pulmonary disease with (acute) exacerbation: Secondary | ICD-10-CM | POA: Diagnosis not present

## 2016-05-17 DIAGNOSIS — I5032 Chronic diastolic (congestive) heart failure: Secondary | ICD-10-CM | POA: Diagnosis not present

## 2016-05-17 DIAGNOSIS — J441 Chronic obstructive pulmonary disease with (acute) exacerbation: Secondary | ICD-10-CM | POA: Diagnosis not present

## 2016-05-17 DIAGNOSIS — I110 Hypertensive heart disease with heart failure: Secondary | ICD-10-CM | POA: Diagnosis not present

## 2016-05-17 DIAGNOSIS — K219 Gastro-esophageal reflux disease without esophagitis: Secondary | ICD-10-CM | POA: Diagnosis not present

## 2016-05-17 DIAGNOSIS — F419 Anxiety disorder, unspecified: Secondary | ICD-10-CM | POA: Diagnosis not present

## 2016-05-17 DIAGNOSIS — E1165 Type 2 diabetes mellitus with hyperglycemia: Secondary | ICD-10-CM | POA: Diagnosis not present

## 2016-05-21 ENCOUNTER — Other Ambulatory Visit: Payer: Self-pay | Admitting: Internal Medicine

## 2016-05-22 MED ORDER — PREDNISONE 10 MG PO TABS: 10.0000 mg | ORAL_TABLET | Freq: Every day | ORAL | 0 refills | Status: DC

## 2016-05-24 ENCOUNTER — Telehealth: Payer: Self-pay | Admitting: Pulmonary Disease

## 2016-05-24 NOTE — Telephone Encounter (Signed)
Called by [name] d/t increased SOB since Dr. [name] cut her Prednisone dose from 20 mg PO Q day to 10 mg PO Q day. Recommended that she increase her daily Prednisone dose back to 20 mg Q day (2 10 mg tablets) and call the PCCM office Monday morning for further recommendations from Dr. [name]. Should her SOB get worse she should go to the Emergency Department for further evaluation.

## 2016-05-31 ENCOUNTER — Other Ambulatory Visit: Payer: Self-pay | Admitting: Internal Medicine

## 2016-05-31 NOTE — Telephone Encounter (Signed)
[place] Controlled Substance Database checked. Okay to fill RX. RX faxed to [facility]

## 2016-06-03 ENCOUNTER — Telehealth: Payer: Self-pay | Admitting: Internal Medicine

## 2016-06-03 MED ORDER — DOXYCYCLINE HYCLATE 100 MG PO TABS: 100.0000 mg | ORAL_TABLET | Freq: Two times a day (BID) | ORAL | 0 refills | Status: AC

## 2016-06-03 NOTE — Telephone Encounter (Signed)
LMTCB-will need to schedule acute OV.

## 2016-06-03 NOTE — Telephone Encounter (Signed)
Pt called back after hours regarding low energy, exertional SOB. Increased cough, tan mucous. She was transiently decreased to pred 10mg , then felt worse and increased back to pred 20mg , has been back on this dose for a week.   Start doxycycline now and work on getting her an OV in the am. I will forward this note to Triage to get her an OV tomorrow.

## 2016-06-03 NOTE — Telephone Encounter (Signed)
Patient needs scheduled for an office appointment tomorrow with a NP or MD to address these problems. Thanks.

## 2016-06-03 NOTE — Telephone Encounter (Signed)
Spoke with pt, who reports of increased sob with exertion, prod cough with tan mucus mainly in the morning and at night, right ankle edema & increased fatigued.  Pt currently taken 10mg  prednisone daily. Pt states she is taken ventolin tid with mild improvement.  Pt is requesting Rx for abx.  JN please advise, as MR is unavailable. Thanks.

## 2016-06-04 NOTE — Telephone Encounter (Signed)
Pt returning call.[name] ° °

## 2016-06-04 NOTE — Telephone Encounter (Signed)
lmtcb x1 for pt to schedule acute visit for today.

## 2016-06-04 NOTE — Telephone Encounter (Signed)
Pt has been scheduled for acute visit with JN on 06/05/16 @ 2:15 per RB. Nothing further needed.

## 2016-06-05 ENCOUNTER — Ambulatory Visit: Payer: Medicare Other | Admitting: Pulmonary Disease

## 2016-06-05 NOTE — Progress Notes (Deleted)
Subjective:    Patient ID: [name], female    DOB: 1945-08-19, 71 y.o.   MRN: [id number]  C.C.:  Acute visit for Dyspnea/Cough with known Severe COPD & Chronic Hypoxic Respiratory Failure.   HPI Dyspnea/Cough:  Severe COPD:  Chronic Hypoxic Respiratory Failure:  Review of Systems   Allergies  Allergen Reactions  . Ambien [Zolpidem Tartrate] Other (See Comments)    Causes nightmares    Current Outpatient Prescriptions on File Prior to Visit  Medication Sig Dispense Refill  . albuterol (PROVENTIL HFA;VENTOLIN HFA) 108 (90 Base) MCG/ACT inhaler Inhale 2 puffs into the lungs every 6 (six) hours as needed for wheezing or shortness of breath. 1 Inhaler 5  . albuterol (PROVENTIL) (2.5 MG/3ML) 0.083% nebulizer solution USE ONE VIAL IN NEBULIZER EVERY 6 HOURS AS NEEDED FOR WHEEZING 75 mL 11  . ALPRAZolam (XANAX) 0.5 MG tablet TAKE 1 TABLET BY MOUTH TWICE A DAY AS NEEDED FOR ANXIETY 60 tablet 2  . azithromycin (ZITHROMAX) 250 MG tablet Take as directed 6 tablet 0  . budesonide-formoterol (SYMBICORT) 160-4.5 MCG/ACT inhaler Inhale 2 puffs into the lungs 2 (two) times daily. 3 Inhaler 2  . doxycycline (VIBRA-TABS) 100 MG tablet Take 1 tablet (100 mg total) by mouth 2 (two) times daily. 14 tablet 0  . fluticasone (FLONASE) 50 MCG/ACT nasal spray Place 2 sprays into both nostrils daily as needed for allergies or rhinitis. 16 g 11  . furosemide (LASIX) 40 MG tablet Take 1 tablet (40 mg total) by mouth daily. 30 tablet 0  . furosemide (LASIX) 40 MG tablet TAKE 1 TABLET (40 MG TOTAL) BY MOUTH DAILY. 90 tablet 1  . GLUCOCOM LANCETS 33G MISC Use with Test Strips to take blood sugars 100 each 5  . glucose blood (BAYER CONTOUR NEXT TEST) test strip Use to take blood sugars twice daily. 100 each 3  . Insulin Glargine (LANTUS SOLOSTAR) 100 UNIT/ML Solostar Pen Inject 22 Units into the skin daily at 10 pm. 5 pen 11  . Insulin Pen Needle (BD PEN NEEDLE NANO U/F) 32G X 4 MM MISC USE TO ADMINISTER  LANTUS INSULIN 100 each 3  . pantoprazole (PROTONIX) 40 MG tablet Take 1 tablet (40 mg total) by mouth daily. 30 tablet 0  . predniSONE (DELTASONE) 10 MG tablet Take 1 tablet (10 mg total) by mouth daily with breakfast. 30 tablet 0  . predniSONE (DELTASONE) 20 MG tablet Take 1 tablet (20 mg total) by mouth daily with breakfast. 21 tablet 0  . Tiotropium Bromide Monohydrate (SPIRIVA RESPIMAT) 2.5 MCG/ACT AERS Inhale 2 puffs into the lungs daily. 4 g 5  . Tiotropium Bromide Monohydrate (SPIRIVA RESPIMAT) 2.5 MCG/ACT AERS Inhale 2 puffs into the lungs daily. 1 Inhaler 0   No current facility-administered medications on file prior to visit.     Past Medical History:  Diagnosis Date  . Arrhythmia   . Bronchiectasis march 2012   On CT chest. RUL. Mild  . CHF (congestive heart failure) ([place])   . Chicken pox   . Chronic diastolic heart failure ([place])   . Chronic respiratory failure ([place])    Followed in Pulmonary clinic/ [facility]/ Ramaswamy  - 06/30/2012 desat to 86%  RA walking 50 ft, recovered to 90% at rest - 06/30/2012  Walked 1lpm x 3 laps @ 185 ft each stopped due to  Sob, no desat  rec 02 2lpm with activity and sleeping, ok at rest   . COPD (chronic obstructive pulmonary disease) ([place])  FEV-1 in 2008 was 63% with a diffusion capacity of 33%.   . Generalized anxiety disorder   . History of cervical cancer 1982  . Hyperlipidemia   . Hypertension   . Leg swelling    Venous doppler right 07/21/12 >>Neg    . 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 Medical non-compliance   . MITRAL VALVE PROLAPSE   . Obesity, unspecified   . Pulmonary nodule march 2012   15mm RUL and RLL 1st seen march 2012 CT, ?progression 12/2012 CT  . Seasonal allergies   . Tobacco abuse     Smokes one pack a day since age 60.  . Tobacco abuse     Past Surgical History:  Procedure Laterality Date  . TOTAL ABDOMINAL HYSTERECTOMY      Family History  Problem Relation  Age of Onset  . Other Father        MVA  . Esophageal cancer Mother     Social History   Social History  . Marital status: Divorced    Spouse name: N/A  . Number of children: N/A  . Years of education: N/A   Occupational History  . works at a call center    Social History Main Topics  . Smoking status: Current Every Day Smoker    Packs/day: 0.50    Years: 44.00    Types: Cigarettes  . Smokeless tobacco: Never Used     Comment: down to 4 cigs per day plus 5 ecigs with nicotien daily//3.14.17  . Alcohol use 0.0 oz/week     Comment: occasional  . Drug use: No  . Sexual activity: Not on file   Other Topics Concern  . Not on file   Social History Narrative   Married but recently separated from husband      Objective:   Physical Exam There were no vitals taken for this visit. General:  Awake. Alert. No acute distress. Sitting watching TV. Family at bedside.  Integument:  Warm & dry. No rash on exposed skin. No bruising. Extremities:  No cyanosis or clubbing.  HEENT:  Moist mucus membranes. No oral ulcers. No scleral injection or icterus. Endotracheal tube in place. PERRL. Cardiovascular:  Regular rate. No edema. No appreciable JVD.  Pulmonary:  Good aeration & clear to auscultation bilaterally. Symmetric chest wall expansion. No accessory muscle use. Abdomen: Soft. Normal bowel sounds. Nondistended. Grossly nontender. Musculoskeletal:  Normal bulk and tone. Hand grip strength 5/5 bilaterally. No joint deformity or effusion appreciated.  PFT 07/29/13: FVC 1.85 L (77%) FEV1 0.89 L (47%) FEV1/FVC 0.48 FEF 25-75 0.37 L (21%) negative bronchodilator response    Assessment & Plan:  71 y.o. female with known underlying severe COPD and chronic hypoxic respiratory failure. Last seen in February with imaging consistent with decompensated diastolic congestive heart failure on CT scan.  1. Severe COPD with acute exacerbation: 2. Chronic hypoxic respiratory failure: 3. Follow-up:  Return to clinic 5/30 with Dr. [name] as scheduled.   Sonia Baller Ashok Cordia, M.D. [facility] Pulmonary & Critical Care Pager:  [phone] After 3pm or if no response, call [phone] 1:17 PM 06/05/16

## 2016-06-06 ENCOUNTER — Encounter: Payer: Self-pay | Admitting: Internal Medicine

## 2016-06-06 ENCOUNTER — Ambulatory Visit (INDEPENDENT_AMBULATORY_CARE_PROVIDER_SITE_OTHER)
Admission: RE | Admit: 2016-06-06 | Discharge: 2016-06-06 | Disposition: A | Discharge status: Home / Self Care | Payer: Medicare Other | Source: Ambulatory Visit | Attending: Internal Medicine | Admitting: Internal Medicine

## 2016-06-06 ENCOUNTER — Telehealth: Payer: Self-pay | Admitting: Internal Medicine

## 2016-06-06 ENCOUNTER — Other Ambulatory Visit (INDEPENDENT_AMBULATORY_CARE_PROVIDER_SITE_OTHER): Payer: Medicare Other

## 2016-06-06 ENCOUNTER — Ambulatory Visit (INDEPENDENT_AMBULATORY_CARE_PROVIDER_SITE_OTHER): Payer: Medicare Other | Admitting: Internal Medicine

## 2016-06-06 VITALS — BP 102/68 | HR 102 | Temp 98.7°F | Wt 228.0 lb

## 2016-06-06 DIAGNOSIS — J9621 Acute and chronic respiratory failure with hypoxia: Secondary | ICD-10-CM

## 2016-06-06 DIAGNOSIS — J441 Chronic obstructive pulmonary disease with (acute) exacerbation: Secondary | ICD-10-CM

## 2016-06-06 DIAGNOSIS — R0602 Shortness of breath: Secondary | ICD-10-CM | POA: Diagnosis not present

## 2016-06-06 DIAGNOSIS — Z8701 Personal history of pneumonia (recurrent): Secondary | ICD-10-CM

## 2016-06-06 DIAGNOSIS — R05 Cough: Secondary | ICD-10-CM | POA: Diagnosis not present

## 2016-06-06 LAB — CBC WITH DIFFERENTIAL/PLATELET
BASOS ABS: 0.1 10*3/uL (ref 0.0–0.1)
BASOS PCT: 0.8 % (ref 0.0–3.0)
EOS PCT: 0.5 % (ref 0.0–5.0)
Eosinophils Absolute: 0 10*3/uL (ref 0.0–0.7)
HEMATOCRIT: 43.5 % (ref 36.0–46.0)
Hemoglobin: 13.4 g/dL (ref 12.0–15.0)
LYMPHS ABS: 0.7 10*3/uL (ref 0.7–4.0)
LYMPHS PCT: 8.2 % — AB (ref 12.0–46.0)
MCHC: 30.9 g/dL (ref 30.0–36.0)
MCV: 82.1 fl (ref 78.0–100.0)
MONOS PCT: 8.8 % (ref 3.0–12.0)
Monocytes Absolute: 0.8 10*3/uL (ref 0.1–1.0)
NEUTROS ABS: 7.2 10*3/uL (ref 1.4–7.7)
Neutrophils Relative %: 81.7 % — ABNORMAL HIGH (ref 43.0–77.0)
Platelets: 262 10*3/uL (ref 150.0–400.0)
RBC: 5.3 Mil/uL — ABNORMAL HIGH (ref 3.87–5.11)
RDW: 22 % — AB (ref 11.5–15.5)
WBC: 8.8 10*3/uL (ref 4.0–10.5)

## 2016-06-06 LAB — BASIC METABOLIC PANEL
BUN: 17 mg/dL (ref 6–23)
CO2: 32 mEq/L (ref 19–32)
Calcium: 9.7 mg/dL (ref 8.4–10.5)
Chloride: 105 mEq/L (ref 96–112)
Creatinine, Ser: 0.95 mg/dL (ref 0.40–1.20)
GFR: 74.99 mL/min (ref >60.00)
GLUCOSE: 183 mg/dL — AB (ref 70–99)
POTASSIUM: 4 meq/L (ref 3.5–5.1)
Sodium: 142 mEq/L (ref 135–145)

## 2016-06-06 LAB — HEPATIC FUNCTION PANEL
ALT: 24 U/L (ref 0–35)
AST: 15 U/L (ref 0–37)
Albumin: 3.9 g/dL (ref 3.5–5.2)
Alkaline Phosphatase: 65 U/L (ref 39–117)
Bilirubin, Direct: 0 mg/dL (ref 0.0–0.3)
TOTAL PROTEIN: 6.7 g/dL (ref 6.0–8.3)
Total Bilirubin: 0.2 mg/dL (ref 0.2–1.2)

## 2016-06-06 LAB — SEDIMENTATION RATE: Sed Rate: 21 mm/hr (ref 0–30)

## 2016-06-06 LAB — TROPONIN I: TNIDX: 0.01 ug/L (ref 0.00–0.06)

## 2016-06-06 NOTE — Telephone Encounter (Signed)
Went to basement for cxr and then reported by office staff that pulse o x80s on > 6L o2 while looking for her phone . She is wating for bus. I advised her I was calling EMS for admission but she flatly refused with Alethia Berthold my RN as witness and Anda Kraft.    Dr. Brand Males, M.D., [facility].C.P Pulmonary and Critical Care Medicine Staff Physician [place] [facility] Pager: [phone], If no answer or between  15:00h - 7:00h: call [phone]  06/06/2016 11:07 AM

## 2016-06-06 NOTE — Progress Notes (Addendum)
Subjective:     Patient ID: [name], female   DOB: [birth year], 71 y.o.   MRN: [id number]  PCP Binnie Rail, MD   HPI   OV 06/06/2016   Chief Complaint  Patient presents with  . Acute Visit    increased SOB x1 week, tightness in chest, slight wheezing, weakness/fatigue, prod cough with beige mucus in the mornings.  denies any f/c/s, hemoptysis,     [name] 71 year old female with chronic hypoxemic respiratory failure chronically on prednisone. In the past she had mediastinal adenopathy did seem to be improving. The base of chronic hypoxemic respiratory failure COPD/bronchiectasis. She's currently on 4-5 liters of oxygen. Then in February 2018 had an admission for COPD exacerbation. Although CT scan of chest at that time did show consolidation but she is not aware of pneumonia diagnosis. Since then she's been on 6 L oxygen. She feels baseline prednisone dose is 20 mg below which she gets symptomatic. She ran out of her prednisone last week and apparently only 10 mg was called in. After that she started noticing more symptoms with fatigue shortness of breath. Is also change in cough with slight change in discoloration of sputum. When she walked and she needed 8 L of oxygen to correct the hypoxemia according to the intake nurse. However she denies that she is sick that she needs hospitalization. She is adamantly refusing hospitalization. She does have slight worsening in her pedal edema compared to baseline. There is no DVT symptoms otherwise.      has a past medical history of Arrhythmia; Bronchiectasis (march 2012); CHF (congestive heart failure) ([place]); Chicken pox; Chronic diastolic heart failure ([place]); Chronic respiratory failure (HCC); COPD (chronic obstructive pulmonary disease) ([place]); Generalized anxiety disorder; History of cervical cancer (1982); Hyperlipidemia; Hypertension; Leg swelling; Mass of mediastinum (march 2012); Medical non-compliance; MITRAL VALVE PROLAPSE; Obesity,  unspecified; Pulmonary nodule (march 2012); Seasonal allergies; Tobacco abuse; and Tobacco abuse.   reports that she has been smoking Cigarettes.  She has a 22.00 pack-year smoking history. She has never used smokeless tobacco.  Past Surgical History:  Procedure Laterality Date  . TOTAL ABDOMINAL HYSTERECTOMY      Allergies  Allergen Reactions  . Ambien [Zolpidem Tartrate] Other (See Comments)    Causes nightmares    Immunization History  Administered Date(s) Administered  . Influenza Split 11/12/2010  . Influenza, High Dose Seasonal PF 11/09/2012, 10/31/2015  . Influenza,inj,Quad PF,36+ Mos 10/20/2014  . Pneumococcal Conjugate-13 05/17/2013  . Pneumococcal Polysaccharide-23 03/22/2010    Family History  Problem Relation Age of Onset  . Other Father        MVA  . Esophageal cancer Mother      Current Outpatient Prescriptions:  .  albuterol (PROVENTIL HFA;VENTOLIN HFA) 108 (90 Base) MCG/ACT inhaler, Inhale 2 puffs into the lungs every 6 (six) hours as needed for wheezing or shortness of breath., Disp: 1 Inhaler, Rfl: 5 .  albuterol (PROVENTIL) (2.5 MG/3ML) 0.083% nebulizer solution, USE ONE VIAL IN NEBULIZER EVERY 6 HOURS AS NEEDED FOR WHEEZING, Disp: 75 mL, Rfl: 11 .  ALPRAZolam (XANAX) 0.5 MG tablet, TAKE 1 TABLET BY MOUTH TWICE A DAY AS NEEDED FOR ANXIETY, Disp: 60 tablet, Rfl: 2 .  azithromycin (ZITHROMAX) 250 MG tablet, Take as directed, Disp: 6 tablet, Rfl: 0 .  budesonide-formoterol (SYMBICORT) 160-4.5 MCG/ACT inhaler, Inhale 2 puffs into the lungs 2 (two) times daily., Disp: 3 Inhaler, Rfl: 2 .  doxycycline (VIBRA-TABS) 100 MG tablet, Take 1 tablet (100 mg total) by mouth  2 (two) times daily., Disp: 14 tablet, Rfl: 0 .  fluticasone (FLONASE) 50 MCG/ACT nasal spray, Place 2 sprays into both nostrils daily as needed for allergies or rhinitis., Disp: 16 g, Rfl: 11 .  furosemide (LASIX) 40 MG tablet, Take 1 tablet (40 mg total) by mouth daily., Disp: 30 tablet, Rfl: 0 .   furosemide (LASIX) 40 MG tablet, TAKE 1 TABLET (40 MG TOTAL) BY MOUTH DAILY., Disp: 90 tablet, Rfl: 1 .  GLUCOCOM LANCETS 33G MISC, Use with Test Strips to take blood sugars, Disp: 100 each, Rfl: 5 .  glucose blood (BAYER CONTOUR NEXT TEST) test strip, Use to take blood sugars twice daily., Disp: 100 each, Rfl: 3 .  Insulin Glargine (LANTUS SOLOSTAR) 100 UNIT/ML Solostar Pen, Inject 22 Units into the skin daily at 10 pm., Disp: 5 pen, Rfl: 11 .  Insulin Pen Needle (BD PEN NEEDLE NANO U/F) 32G X 4 MM MISC, USE TO ADMINISTER LANTUS INSULIN, Disp: 100 each, Rfl: 3 .  pantoprazole (PROTONIX) 40 MG tablet, Take 1 tablet (40 mg total) by mouth daily., Disp: 30 tablet, Rfl: 0 .  predniSONE (DELTASONE) 10 MG tablet, Take 1 tablet (10 mg total) by mouth daily with breakfast., Disp: 30 tablet, Rfl: 0 .  predniSONE (DELTASONE) 20 MG tablet, Take 1 tablet (20 mg total) by mouth daily with breakfast., Disp: 21 tablet, Rfl: 0 .  Tiotropium Bromide Monohydrate (SPIRIVA RESPIMAT) 2.5 MCG/ACT AERS, Inhale 2 puffs into the lungs daily., Disp: 4 g, Rfl: 5 .  Tiotropium Bromide Monohydrate (SPIRIVA RESPIMAT) 2.5 MCG/ACT AERS, Inhale 2 puffs into the lungs daily., Disp: 1 Inhaler, Rfl: 0   Review of Systems     Objective:   Physical Exam  Constitutional: She is oriented to person, place, and time. She appears well-developed and well-nourished. No distress.  Obese Looks same like before Non-toxic looking  HENT:  Head: Normocephalic and atraumatic.  Right Ear: External ear normal.  Left Ear: External ear normal.  Mouth/Throat: Oropharynx is clear and moist. No oropharyngeal exudate.  o2 on  Eyes: Conjunctivae and EOM are normal. Pupils are equal, round, and reactive to light. Right eye exhibits no discharge. Left eye exhibits no discharge. No scleral icterus.  Neck: Normal range of motion. Neck supple. No JVD present. No tracheal deviation present. No thyromegaly present.  Cardiovascular: Normal rate, regular  rhythm, normal heart sounds and intact distal pulses.  Exam reveals no gallop and no friction rub.   No murmur heard. Pulmonary/Chest: Effort normal and breath sounds normal. No respiratory distress. She has no wheezes. She has no rales. She exhibits no tenderness.  Mild labord breathing when talking a lot No wheeze No distress OVerall air entry dminished like baseline  Abdominal: Soft. Bowel sounds are normal. She exhibits no distension and no mass. There is no tenderness. There is no rebound and no guarding.  Musculoskeletal: Normal range of motion. She exhibits no edema or tenderness.  Lymphadenopathy:    She has no cervical adenopathy.  Neurological: She is alert and oriented to person, place, and time. She has normal reflexes. No cranial nerve deficit. She exhibits normal muscle tone. Coordination normal.  Skin: Skin is warm and dry. No rash noted. She is not diaphoretic. No erythema. No pallor.  Dry skin  Psychiatric: She has a normal mood and affect. Her behavior is normal. Judgment and thought content normal.  Vitals reviewed.   Vitals:   06/06/16 1005  BP: 102/68  Pulse: (!) 102  Temp: 98.7 F (37.1  C)  TempSrc: Oral  SpO2: 93%  Weight: 228 lb (103.4 kg)   Vitals:   06/06/16 1005  BP: 102/68  Pulse: (!) 102  Temp: 98.7 F (37.1 C)  TempSrc: Oral  SpO2: 93%  Weight: 228 lb (103.4 kg)         Assessment:       ICD-9-CM ICD-10-CM   1. COPD with acute exacerbation (HCC) 491.21 J44.1 CBC w/Diff     Basic Metabolic Panel (BMET)     Hepatic function panel     Troponin I     Sed Rate (ESR)     DG Chest 2 View  2. Acute on chronic respiratory failure with hypoxia (HCC) 518.84 J96.21 CBC w/Diff   509.32  Basic Metabolic Panel (BMET)     Hepatic function panel     Troponin I     Sed Rate (ESR)     DG Chest 2 View  3. History of bacterial pneumonia V12.61 Z87.01 CBC w/Diff     Basic Metabolic Panel (BMET)     Hepatic function panel     Troponin I     Sed Rate  (ESR)     DG Chest 2 View   Need to see if  pneumonia infiltrates are improved from February 2018    Plan:     Respect decision to absoutely refuse admission    Do cbc, bmet, lft, trop, esr  Do Cxr 2 view  Do Take doxycycline '100mg'$  po twice daily x 7 days; take after meals and avoid sunlight   Increase prednisone   - Please take Take prednisone '40mg'$  once daily x 5  days, then '30mg'$  once daily x 5  days, then '20mg'$  once daily to continue  Continue o2 and nebs as before  Followup ER if worse Otherwise 06/19/16 with Dr [name] as before    > 50% of this > 25 min visit spent in face to face counseling or coordination of care   Dr. Brand Males, M.D., [facility].C.P Pulmonary and Critical Care Medicine Staff Physician [place] [facility] and Critical [facility] Pager: [phone], If no answer or between  15:00h - 7:00h: call [phone]  06/06/2016 10:31 AM

## 2016-06-06 NOTE — Patient Instructions (Addendum)
ICD-9-CM ICD-10-CM   1. COPD with acute exacerbation ([place]) 491.21 J44.1   2. Acute on chronic respiratory failure with hypoxia (HCC) 518.84 J96.21    799.02    3. History of bacterial pneumonia V12.61 Z87.01     Do cbc, bmet, lft, trop, esr  Do Cxr 2 view  Do Take doxycycline 153m po twice daily x 7 days; take after meals and avoid sunlight   Increase prednisone   - Please take Take prednisone 431monce daily x 5  days, then 309mnce daily x 5  days, then 61m52mce daily to continue  Continue o2 and nebs as before  Followup ER if worse Otherwise 06/19/16 with Dr RamaChase Callerbefore

## 2016-06-07 ENCOUNTER — Telehealth: Payer: Self-pay | Admitting: Internal Medicine

## 2016-06-07 MED ORDER — PREDNISONE 10 MG PO TABS: ORAL_TABLET | ORAL | 0 refills | Status: DC

## 2016-06-07 NOTE — Telephone Encounter (Signed)
This message was answered in a phone message from 06/07/16

## 2016-06-07 NOTE — Telephone Encounter (Signed)
See my ov from yesterday  Dr. Brand Males, M.D., [facility]C.P Pulmonary and Critical Care Medicine Staff Physician [place] [facility] Pager: [phone], If no answer or between  15:00h - 7:00h: call [phone]  06/07/2016 4:22 PM

## 2016-06-07 NOTE — Telephone Encounter (Signed)
MR please advise. Thanks! 

## 2016-06-07 NOTE — Telephone Encounter (Signed)
-   Please take Take prednisone 40mg  once daily x 5  days, then 30mg  once daily x 5  days, then 20mg  once daily to continue  Called and spoke with pt and she is aware of recs per MR.  Prednisone has been sent to the pharmacy. Nothing further is needed.

## 2016-06-07 NOTE — Telephone Encounter (Signed)
see my pred instructions from  [facility] 06/06/16 office visit. She is supposed to burst and taper down ultimately to 20mg  per day. Did she not fill it?   Dr. Brand Males, M.D., [facility].C.P Pulmonary and Critical Care Medicine Staff Physician [place] [facility] Pager: [phone], If no answer or between  15:00h - 7:00h: call [phone]  06/07/2016 3:50 PM

## 2016-06-07 NOTE — Telephone Encounter (Signed)
MR please advise if the pt should be on pred 20 mg daily. She stated that the doctor on call ( I do not see a note from 5/2 for this)  Sent her pred 20 everyday.  She stated that she only has 3 tablets left and needs this called in for her.  MR please advise. Thanks  Allergies  Allergen Reactions  . Ambien [Zolpidem Tartrate] Other (See Comments)    Causes nightmares

## 2016-06-07 NOTE — Telephone Encounter (Signed)
Daneil Dan I am unsure of what this message is referring to. Please see MR's previous message below

## 2016-06-07 NOTE — Telephone Encounter (Signed)
  LABS and cxr ok   Plan  fu plan from yeserday Any worsening call EMS and go to ER  Dr. Brand Males, M.D., [facility].C.P Pulmonary and Critical Care Medicine Staff Physician [place] [facility] Pager: [phone], If no answer or between  15:00h - 7:00h: call [phone]  06/07/2016 8:38 AM     PULMONARY No results for input(s): PHART, PCO2ART, PO2ART, HCO3, TCO2, O2SAT in the last 168 hours.  Invalid input(s): PCO2, PO2  CBC  Recent Labs Lab 06/06/16 1032  HGB 13.4  HCT 43.5  WBC 8.8  PLT 262.0    COAGULATION No results for input(s): INR in the last 168 hours.  CARDIAC  No results for input(s): TROPONINI in the last 168 hours. No results for input(s): PROBNP in the last 168 hours.   CHEMISTRY  Recent Labs Lab 06/06/16 1032  NA 142  K 4.0  CL 105  CO2 32  GLUCOSE 183*  BUN 17  CREATININE 0.95  CALCIUM 9.7   Estimated Creatinine Clearance: 65.5 mL/min (by C-G formula based on SCr of 0.95 mg/dL).   LIVER  Recent Labs Lab 06/06/16 1032  AST 15  ALT 24  ALKPHOS 65  BILITOT 0.2  PROT 6.7  ALBUMIN 3.9     INFECTIOUS No results for input(s): LATICACIDVEN, PROCALCITON in the last 168 hours.   ENDOCRINE CBG (last 3)  No results for input(s): GLUCAP in the last 72 hours.       IMAGING x48h  - image(s) personally visualized  -   highlighted in bold Dg Chest 2 View  Result Date: 06/06/2016 CLINICAL DATA:  Increasing shortness of breath and cough for several days EXAM: CHEST  2 VIEW COMPARISON:  02/24/2016 FINDINGS: Cardiac shadow is enlarged but stable. Diffuse scarring is noted throughout both lungs. No focal confluent infiltrate is seen. Hyperinflation consistent with COPD is noted. Degenerative changes of the thoracic spine are seen. IMPRESSION: COPD with bilateral scarring.  No acute abnormality is noted. Electronically Signed   By: Inez Catalina M.D.   On: 06/06/2016 10:52

## 2016-06-07 NOTE — Telephone Encounter (Signed)
lmtcb for pt.  

## 2016-06-07 NOTE — Telephone Encounter (Signed)
Patient is calling back stating she has taken all of the prednisone and needs this prescribed today.  Pharm is [facility] on [address] is [phone].  Advised this has been sent to Dr. [name] but she is afraid we will close and she will be out of prednisone all weekend.  Asking for someone to call her today.

## 2016-06-11 NOTE — Telephone Encounter (Signed)
Called and spoke with pt and she is aware of lab and cxr results per MR>  Pt stated that she just started with coughing up the stuff out of her chest.  I advised her that the abx will continue to work after she has finished it as well and that it will take a little bit for her to get her energy back.  Pt voiced her understanding and nothing further is needed.

## 2016-06-11 NOTE — Telephone Encounter (Signed)
Patient calling for cxr and lab results -pt can be reached at [phone] -pr

## 2016-06-16 ENCOUNTER — Encounter: Payer: Self-pay | Admitting: Internal Medicine

## 2016-06-18 NOTE — Telephone Encounter (Signed)
MR, pt is asking for order for PT/OT  She did originally ask for this on 5/17 but she was also having some additional issues with her COPD and prednisone dose  May we send order for PT/OT?  Thank you.

## 2016-06-18 NOTE — Telephone Encounter (Signed)
Can try home PT/OT  Dr. Brand Males, M.D., [facility].C.P Pulmonary and Critical Care Medicine Staff Physician [place] [facility] Pager: [phone], If no answer or between  15:00h - 7:00h: call [phone]  06/18/2016 5:35 PM

## 2016-06-19 ENCOUNTER — Encounter: Payer: Self-pay | Admitting: Internal Medicine

## 2016-06-19 ENCOUNTER — Ambulatory Visit (INDEPENDENT_AMBULATORY_CARE_PROVIDER_SITE_OTHER): Payer: Medicare Other | Admitting: Internal Medicine

## 2016-06-19 ENCOUNTER — Ambulatory Visit: Payer: Medicare Other | Admitting: Internal Medicine

## 2016-06-19 VITALS — BP 108/64 | HR 98 | Ht 64.0 in | Wt 226.0 lb

## 2016-06-19 DIAGNOSIS — R590 Localized enlarged lymph nodes: Secondary | ICD-10-CM

## 2016-06-19 DIAGNOSIS — J9611 Chronic respiratory failure with hypoxia: Secondary | ICD-10-CM

## 2016-06-19 DIAGNOSIS — J9612 Chronic respiratory failure with hypercapnia: Secondary | ICD-10-CM | POA: Diagnosis not present

## 2016-06-19 DIAGNOSIS — J449 Chronic obstructive pulmonary disease, unspecified: Secondary | ICD-10-CM

## 2016-06-19 NOTE — Progress Notes (Signed)
Subjective:     Patient ID: [name], female   DOB: 1946/01/08, 71 y.o.   MRN: [id number]  HPI   PCP Toni Rail, MD   HPI   OV 06/06/2016   Chief Complaint  Patient presents with  . Acute Visit    increased SOB x1 week, tightness in chest, slight wheezing, weakness/fatigue, prod cough with beige mucus in the mornings.  denies any f/c/s, hemoptysis,     [name]  female with chronic hypoxemic respiratory failure chronically on prednisone. In the past she had mediastinal adenopathy did seem to be improving. The base of chronic hypoxemic respiratory failure COPD/bronchiectasis. She's currently on 4-5 liters of oxygen. Then in February 2018 had an admission for COPD exacerbation. Although CT scan of chest at that time did show consolidation but she is not aware of pneumonia diagnosis. Since then she's been on 6 L oxygen. She feels baseline prednisone dose is 20 mg below which she gets symptomatic. She ran out of her prednisone last week and apparently only 10 mg was called in. After that she started noticing more symptoms with fatigue shortness of breath. Is also change in cough with slight change in discoloration of sputum. When she walked and she needed 8 L of oxygen to correct the hypoxemia according to the intake nurse. However she denies that she is sick that she needs hospitalization. She is adamantly refusing hospitalization. She does have slight worsening in her pedal edema compared to baseline. There is no DVT symptoms otherwise.     OV 06/19/2016  Chief Complaint  Patient presents with  . Follow-up    Pt states her SOB has slightly improved since last OV but is not back to baseline. Pt c/o prod cough with cloudy mucus - pt states it is worse at night and in the morning.    Follow-up chronic hypoxic respiratory failure chronically on prednisone. Saw her in 06/06/2016 during an acute exacerbation. She declined admission. We had treated with outpatient antibiotic and  prednisone. She feels 20 mg of prednisone is the lowest she can go. Review of the chart actually shows she has chronic hypercapnic respiratory failure as well. She still feels fatigued. She's not using nocturnal ventilation. Currently in the office today although she feels better she didn't desaturate to 80% while ambulating on 4 L of oxygen. Most recent chest x-ray and labs were all okay. This no sputum or fever at this point in time.   Results for Toni, [name] (MRN [id number]) as of 06/19/2016 12:23  Ref. Range 02/22/2016 07:37 02/23/2016 02:53 02/26/2016 02:17 02/27/2016 03:02 06/06/2016 10:32  Creatinine Latest Ref Range: 0.40 - 1.20 mg/dL  0.93 0.82 0.79 0.95  Results for Toni, [name] (MRN [id number]) as of 06/19/2016 12:23  Ref. Range 02/22/2016 07:37 02/23/2016 02:53 02/26/2016 02:17 02/27/2016 03:02 06/06/2016 10:32  EGFR (African American) Latest Ref Range: >60 mL/min  >60 >60 >60     Results for Toni, [name] (MRN [id number]) as of 06/19/2016 12:23  Ref. Range 02/22/2016 04:19 02/22/2016 08:06 02/22/2016 08:06  pH, Arterial Latest Ref Range: 7.350 - 7.450  7.260 (L) 7.236 (L) 7.236 (L)  pCO2 arterial Latest Ref Range: 32.0 - 48.0 mmHg 68.8 (HH) 67.9 (HH) 67.9 (HH)  pO2, Arterial Latest Ref Range: 83.0 - 108.0 mmHg 80.0 (L) 33.0 (LL) 33.0 (LL)   Results for Toni, [name] (MRN [id number]) as of 06/19/2016 12:23  Ref. 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   reports that she has been smoking Cigarettes.  She has a 22.00 pack-year smoking history. She has never used smokeless tobacco.  Past Surgical History:  Procedure Laterality Date  . TOTAL ABDOMINAL HYSTERECTOMY      Allergies  Allergen Reactions  . Ambien [Zolpidem Tartrate] Other (See Comments)    Causes nightmares    Immunization History  Administered Date(s) Administered  . Influenza Split 11/12/2010  . Influenza, High Dose Seasonal PF 11/09/2012, 10/31/2015  . Influenza,inj,Quad PF,36+ Mos 10/20/2014  . Pneumococcal Conjugate-13 05/17/2013  . Pneumococcal Polysaccharide-23 03/22/2010    Family History  Problem Relation Age of Onset  . Other Father        MVA  . Esophageal cancer Mother      Current Outpatient Prescriptions:  .  albuterol (PROVENTIL HFA;VENTOLIN HFA) 108 (90 Base) MCG/ACT inhaler, Inhale 2 puffs into the lungs every 6 (six) hours as needed for wheezing or shortness of breath., Disp: 1 Inhaler, Rfl: 5 .  albuterol (PROVENTIL) (2.5 MG/3ML) 0.083% nebulizer solution, USE ONE VIAL IN NEBULIZER EVERY 6 HOURS AS NEEDED FOR WHEEZING, Disp: 75 mL, Rfl: 11 .  ALPRAZolam (XANAX) 0.5 MG tablet, TAKE 1 TABLET BY MOUTH TWICE A DAY AS NEEDED FOR ANXIETY, Disp: 60 tablet, Rfl: 2 .  budesonide-formoterol (SYMBICORT) 160-4.5 MCG/ACT inhaler, Inhale 2 puffs into the lungs 2 (two) times daily., Disp: 3 Inhaler, Rfl: 2 .  fluticasone (FLONASE) 50 MCG/ACT nasal spray, Place 2 sprays into both nostrils daily as needed for allergies or rhinitis., Disp: 16 g, Rfl: 11 .  furosemide (LASIX) 40 MG tablet, TAKE 1 TABLET (40 MG TOTAL) BY MOUTH DAILY., Disp: 90 tablet, Rfl: 1 .  GLUCOCOM LANCETS 33G MISC, Use with Test Strips to take blood sugars, Disp: 100 each, Rfl: 5 .  glucose blood (BAYER CONTOUR NEXT TEST) test strip, Use to take blood sugars twice daily., Disp: 100 each, Rfl: 3 .  Insulin Glargine (LANTUS  SOLOSTAR) 100 UNIT/ML Solostar Pen, Inject 22 Units into the skin daily at 10 pm., Disp: 5 pen, Rfl: 11 .  Insulin Pen Needle (BD PEN NEEDLE NANO U/F) 32G X 4 MM MISC, USE TO ADMINISTER LANTUS INSULIN, Disp: 100 each, Rfl: 3 .  pantoprazole (PROTONIX) 40 MG tablet, Take 1 tablet (40 mg total) by mouth daily., Disp: 30 tablet, Rfl: 0 .  predniSONE (DELTASONE) 10 MG tablet, Take 1 tablet (10 mg total) by mouth daily with breakfast., Disp: 30 tablet, Rfl: 0 .  predniSONE (DELTASONE) 10 MG tablet, Take 40 mg daily x 5 days, 30 mg daily x 5 days, then stay on 20 mg daily, Disp: 90 tablet, Rfl: 0 .  Tiotropium Bromide Monohydrate (SPIRIVA RESPIMAT) 2.5 MCG/ACT AERS, Inhale 2 puffs into the lungs daily., Disp: 4 g, Rfl: 5   Review of Systems     Objective:   Physical Exam Vitals:   06/19/16 1203  BP: 108/64  Pulse: 98  SpO2: 92%  Weight: 226 lb (102.5 kg)  Height: '5\' 4"'$  (1.626 m)    Estimated body mass index is 38.79 kg/m as calculated  from the following:   Height as of this encounter: '5\' 4"'$  (1.626 m).   Weight as of this encounter: 226 lb (102.5 kg).  Looks better Sitting in chair o2 on Looking better Pulse ox on 4L [place] -> ambulation across hallway desaturate to 80% No crackles No wheeze Chronic venous stasis edema    Assessment:       ICD-9-CM ICD-10-CM   1. Chronic respiratory failure with hypoxia and hypercapnia (HCC) 518.83 J96.11    799.02 J96.12    786.09    2. Chronic obstructive pulmonary disease, unspecified COPD type ([place]) 496 J44.9   3. Mediastinal adenopathy 785.6 R59.0        Plan:      Glad you are better  Do abg next few to several days while at stable state  - decide on night bipap based on abg results COntnue o2 > 4L [place] - keep pulse ox > 86% Continue prednisone at '20mg'$  per day Continue nebs Refer home PT/OT with advanced home care Lung FLute device on your own I think but can ask [facility] for referral Do CT chest with contrast in 3 months  Followup   in 3 months but after ct chest   > 50% of this > 25 min visit spent in face to face counseling or coordination of care   Dr. Brand Males, M.D., [facility].C.P Pulmonary and Critical Care Medicine Staff Physician [place] [facility] Pager: [phone], If no answer or between  15:00h - 7:00h: call [phone]  06/19/2016 12:35 PM

## 2016-06-19 NOTE — Telephone Encounter (Signed)
Pt seen today by MR and order has already been placed at the visit Will sign off

## 2016-06-19 NOTE — Patient Instructions (Signed)
ICD-9-CM ICD-10-CM   1. Chronic respiratory failure with hypoxia and hypercapnia (HCC) 518.83 J96.11    799.02 J96.12    786.09    2. Chronic obstructive pulmonary disease, unspecified COPD type ([place]) 496 J44.9   3. Mediastinal adenopathy 785.6 R59.0    Glad you are better  Do abg next few to several days while at stable state  - decide on night bipap based on abg results COntnue o2 > 4L [place] - keep pulse ox > 86% Continue prednisone at 20mg  per day Continue nebs Refer home PT/OT with advanced home care Lung FLute device on your own I think but can ask [facility] for referral Do CT chest with contrast in 3 months  Followup  in 3 months but after ct chest

## 2016-06-22 ENCOUNTER — Other Ambulatory Visit: Payer: Self-pay | Admitting: Internal Medicine

## 2016-06-23 NOTE — Progress Notes (Signed)
Subjective:    Patient ID: [name], female    DOB: 10-31-1945, 71 y.o.   MRN: [id number]  HPI The patient is here for follow up.  Diabetes: She is taking her medication daily as prescribed. She is compliant with a diabetic diet. She is not exercising regularly. She monitors her sugars and they have been running 79-204. Approximately 3 weeks ago her steroid dose was increased and she is now tapering down.  Chronic diastolic dysfunction, Hypertension: She is taking her medication daily. She is compliant with a low sodium diet.  She denies chest pain, palpitations, and regular headaches. She is not exercising regularly.  She does not monitor her blood pressure at home.    GERD:  She is taking her medication daily as prescribed.  She denies any GERD symptoms.  She was started on medication because of the chronic prednisone use.  COPD, chronic respiratory failure with hypoxia:  She remains on oxygen and chronic prednisone.  She is following closely with pulmonary.  Anxiety: She is taking her medication daily as prescribed. She denies any side effects from the medication. She feels her anxiety is not well controlled. She wonders if the Xanax needs to be increased. She also notes some depression. She has been on antidepressant once and it was not effective-sertraline.  Her depression and anxiety are related to her health problems.   Medications and allergies reviewed with patient and updated if appropriate.  Patient Active Problem List   Diagnosis Date Noted  . History of bacterial pneumonia 06/06/2016  . Pneumonia of both lungs due to infectious organism   . Acute kidney injury ([place])   . Chronic respiratory failure with hypoxia ([place])   . Pulmonary hypertension ([place])   . Uncontrolled type 2 diabetes mellitus with complication ([place])   . COPD with acute exacerbation ([place]) 02/22/2016  . Physical deconditioning 11/15/2015  . GERD (gastroesophageal reflux disease) 11/01/2015  .  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 Diabetes ([place]) 01/23/2015  . Fatigue 01/20/2015  . Chronic obstructive pulmonary disease ([place]) 10/18/2014  . Generalized anxiety disorder 03/15/2013  . Obesity, unspecified 03/15/2013  . Chronic diastolic heart failure ([place]) 03/14/2013  . Chronic respiratory failure ([place]) 06/30/2012  . COPD (chronic obstructive pulmonary disease) ([place])   . Tobacco abuse   . Pulmonary nodule 03/22/2010  . 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 Bronchiectasis 03/22/2010  . Hyperlipidemia 06/13/2008  . Essential hypertension 03/06/2007  . ADJUSTMENT DISORDER WITH ANXIETY 02/20/2007  . MITRAL VALVE PROLAPSE 02/20/2007  . CERVICAL CANCER, HX OF 02/20/2007    Current Outpatient Prescriptions on File Prior to Visit  Medication Sig Dispense Refill  . albuterol (PROVENTIL HFA;VENTOLIN HFA) 108 (90 Base) MCG/ACT inhaler Inhale 2 puffs into the lungs every 6 (six) hours as needed for wheezing or shortness of breath. 1 Inhaler 5  . albuterol (PROVENTIL) (2.5 MG/3ML) 0.083% nebulizer solution USE ONE VIAL IN NEBULIZER EVERY 6 HOURS AS NEEDED FOR WHEEZING 75 mL 11  . ALPRAZolam (XANAX) 0.5 MG tablet TAKE 1 TABLET BY MOUTH TWICE A DAY AS NEEDED FOR ANXIETY 60 tablet 2  . budesonide-formoterol (SYMBICORT) 160-4.5 MCG/ACT inhaler Inhale 2 puffs into the lungs 2 (two) times daily. 3 Inhaler 2  . fluticasone (FLONASE) 50 MCG/ACT nasal spray USE 2 SPRAYS IN EACH NOSTRIL EVERY DAY AS NEEDED FOR ALLERGIES AND RHINITIS 16 g 3  . furosemide (LASIX) 40 MG tablet TAKE 1 TABLET (40 MG TOTAL) BY MOUTH DAILY. 90 tablet 1  . GLUCOCOM LANCETS 33G MISC  Use with Test Strips to take blood sugars 100 each 5  . glucose blood (BAYER CONTOUR NEXT TEST) test strip Use to take blood sugars twice daily. 100 each 3  . Insulin Glargine (LANTUS SOLOSTAR) 100 UNIT/ML Solostar Pen Inject 22 Units into the skin daily at 10 pm. 5 pen 11  . Insulin Pen Needle (BD PEN NEEDLE NANO U/F) 32G X 4 MM MISC USE TO ADMINISTER  LANTUS INSULIN 100 each 3  . pantoprazole (PROTONIX) 40 MG tablet Take 1 tablet (40 mg total) by mouth daily. 30 tablet 0  . predniSONE (DELTASONE) 10 MG tablet Take 1 tablet (10 mg total) by mouth daily with breakfast. 30 tablet 0  . predniSONE (DELTASONE) 10 MG tablet Take 40 mg daily x 5 days, 30 mg daily x 5 days, then stay on 20 mg daily 90 tablet 0  . Tiotropium Bromide Monohydrate (SPIRIVA RESPIMAT) 2.5 MCG/ACT AERS Inhale 2 puffs into the lungs daily. 4 g 5   No current facility-administered medications on file prior to visit.     Past Medical History:  Diagnosis Date  . Arrhythmia   . Bronchiectasis march 2012   On CT chest. RUL. Mild  . CHF (congestive heart failure) ([place])   . Chicken pox   . Chronic diastolic heart failure ([place])   . Chronic respiratory failure ([place])    Followed in Pulmonary clinic/ [facility]/ Ramaswamy  - 06/30/2012 desat to 86%  RA walking 50 ft, recovered to 90% at rest - 06/30/2012  Walked 1lpm x 3 laps @ 185 ft each stopped due to  Sob, no desat  rec 02 2lpm with activity and sleeping, ok at rest   . COPD (chronic obstructive pulmonary disease) ([place])     FEV-1 in 2008 was 63% with a diffusion capacity of 33%.   . Generalized anxiety disorder   . History of cervical cancer 1982  . Hyperlipidemia   . Hypertension   . Leg swelling    Venous doppler right 07/21/12 >>Neg    . 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 Medical non-compliance   . MITRAL VALVE PROLAPSE   . Obesity, unspecified   . Pulmonary nodule march 2012   21mm RUL and RLL 1st seen march 2012 CT, ?progression 12/2012 CT  . Seasonal allergies   . Tobacco abuse     Smokes one pack a day since age 37.  . Tobacco abuse     Past Surgical History:  Procedure Laterality Date  . TOTAL ABDOMINAL HYSTERECTOMY      Social History   Social History  . Marital status: Divorced    Spouse name: N/A  . Number of children: N/A  . Years of education:  N/A   Occupational History  . works at a call center    Social History Main Topics  . Smoking status: Current Every Day Smoker    Packs/day: 0.50    Years: 44.00    Types: Cigarettes  . Smokeless tobacco: Never Used     Comment: 4 cigs per day 06/19/2016  . Alcohol use 0.0 oz/week     Comment: occasional  . Drug use: No  . Sexual activity: Not on file   Other Topics Concern  . Not on file   Social History Narrative   Married but recently separated from husband    Family History  Problem Relation Age of Onset  . Other Father  MVA  . Esophageal cancer Mother     Review of Systems  Constitutional: Positive for fatigue. Negative for chills and fever.  Respiratory: Positive for cough (first thing in the morning and at night), shortness of breath and wheezing (first thing in the morning and at nigh).   Cardiovascular: Positive for leg swelling (intermittent). Negative for chest pain and palpitations.  Gastrointestinal:       No gerd  Neurological: Positive for headaches (mild, intermittent). Negative for light-headedness.  Psychiatric/Behavioral: Positive for dysphoric mood. The patient is nervous/anxious.        Objective:   Vitals:   06/24/16 1338  BP: 118/68  Pulse: (!) 106  Resp: 16  Temp: 97.9 F (36.6 C)   Wt Readings from Last 3 Encounters:  06/19/16 226 lb (102.5 kg)  06/06/16 228 lb (103.4 kg)  03/19/16 220 lb (99.8 kg)   There is no height or weight on file to calculate BMI.   Physical Exam    Constitutional: Appears well-developed and well-nourished. No distress.  HENT:  Head: Normocephalic and atraumatic.  Neck: Neck supple. No tracheal deviation present. No thyromegaly present.  No cervical lymphadenopathy Cardiovascular: Normal rate, regular rhythm and normal heart sounds.   No murmur heard. No carotid bruit .  No edema Pulmonary/Chest: Mildrespiratory distress -  Short of breath with talking at times.  Diffusely decreased BS.. No has no  wheezes. No rales.  Skin: Skin is warm and dry. Not diaphoretic.  Psychiatric: Normal mood and affect. Behavior is normal.   Diabetic Foot Exam - Simple   Simple Foot Form Diabetic Foot exam was performed with the following findings:  Yes 06/24/2016  2:14 PM  Visual Inspection No deformities, no ulcerations, no other skin breakdown bilaterally:  Yes Sensation Testing Intact to touch and monofilament testing bilaterally:  Yes Pulse Check Posterior Tibialis and Dorsalis pulse intact bilaterally:  Yes Comments Long toenails, dry skin       Assessment & Plan:    See Problem List for Assessment and Plan of chronic medical problems.

## 2016-06-24 ENCOUNTER — Ambulatory Visit (INDEPENDENT_AMBULATORY_CARE_PROVIDER_SITE_OTHER): Payer: Medicare Other | Admitting: Internal Medicine

## 2016-06-24 ENCOUNTER — Encounter: Payer: Self-pay | Admitting: Internal Medicine

## 2016-06-24 VITALS — BP 118/68 | HR 106 | Temp 97.9°F | Resp 16

## 2016-06-24 DIAGNOSIS — F411 Generalized anxiety disorder: Secondary | ICD-10-CM

## 2016-06-24 DIAGNOSIS — I10 Essential (primary) hypertension: Secondary | ICD-10-CM | POA: Diagnosis not present

## 2016-06-24 DIAGNOSIS — Z23 Encounter for immunization: Secondary | ICD-10-CM | POA: Diagnosis not present

## 2016-06-24 DIAGNOSIS — F329 Major depressive disorder, single episode, unspecified: Secondary | ICD-10-CM | POA: Insufficient documentation

## 2016-06-24 DIAGNOSIS — F3289 Other specified depressive episodes: Secondary | ICD-10-CM | POA: Diagnosis not present

## 2016-06-24 DIAGNOSIS — Z794 Long term (current) use of insulin: Secondary | ICD-10-CM | POA: Diagnosis not present

## 2016-06-24 DIAGNOSIS — K219 Gastro-esophageal reflux disease without esophagitis: Secondary | ICD-10-CM | POA: Diagnosis not present

## 2016-06-24 DIAGNOSIS — E119 Type 2 diabetes mellitus without complications: Secondary | ICD-10-CM

## 2016-06-24 DIAGNOSIS — F32A Depression, unspecified: Secondary | ICD-10-CM | POA: Insufficient documentation

## 2016-06-24 LAB — POCT GLYCOSYLATED HEMOGLOBIN (HGB A1C): Hemoglobin A1C: 7.4

## 2016-06-24 MED ORDER — ALPRAZOLAM 0.5 MG PO TABS: ORAL_TABLET | ORAL | 2 refills | Status: DC

## 2016-06-24 MED ORDER — PAROXETINE HCL 20 MG PO TABS: 20.0000 mg | ORAL_TABLET | Freq: Every day | ORAL | 5 refills | Status: DC

## 2016-06-24 NOTE — Assessment & Plan Note (Signed)
A1c currently 7.4% Currently on 20 mg of prednisone Sugars variable at home-stressed compliance with a diabetic diet. Advised her to monitor her sugars closely and discussed that we may need to revise her insulin ever sugars are too low or too high-this will be related to the dose of the steroids Continue current dose of insulin

## 2016-06-24 NOTE — Assessment & Plan Note (Signed)
New, related to her medical problems Discussed daily medication-we'll start Paxil 20 mg daily Call with side effects Follow-up in 2 months, sooner if needed

## 2016-06-24 NOTE — Assessment & Plan Note (Signed)
Never had GERD - she is on daily prednisone No gerd symptoms Continue PPI

## 2016-06-24 NOTE — Assessment & Plan Note (Addendum)
Not controlled Continue xanax, will increase to 3 times a day as needed Start paxil

## 2016-06-24 NOTE — Patient Instructions (Addendum)
   Pneumovax immunization administered today.   Medications reviewed and updated.  Changes include starting paxil 20 mg daily.  Your xanax was increased to three times a day.   Your prescription(s) have been submitted to your pharmacy. Please take as directed and contact our office if you believe you are having problem(s) with the medication(s).   Please followup in 2 months

## 2016-06-24 NOTE — Assessment & Plan Note (Signed)
BP well controlled Current regimen effective and well tolerated Continue current medications at current doses  

## 2016-06-26 DIAGNOSIS — K219 Gastro-esophageal reflux disease without esophagitis: Secondary | ICD-10-CM | POA: Diagnosis not present

## 2016-06-26 DIAGNOSIS — Z794 Long term (current) use of insulin: Secondary | ICD-10-CM | POA: Diagnosis not present

## 2016-06-26 DIAGNOSIS — Z720 Tobacco use: Secondary | ICD-10-CM | POA: Diagnosis not present

## 2016-06-26 DIAGNOSIS — E119 Type 2 diabetes mellitus without complications: Secondary | ICD-10-CM | POA: Diagnosis not present

## 2016-06-26 DIAGNOSIS — Z9981 Dependence on supplemental oxygen: Secondary | ICD-10-CM | POA: Diagnosis not present

## 2016-06-26 DIAGNOSIS — F411 Generalized anxiety disorder: Secondary | ICD-10-CM | POA: Diagnosis not present

## 2016-06-26 DIAGNOSIS — Z7951 Long term (current) use of inhaled steroids: Secondary | ICD-10-CM | POA: Diagnosis not present

## 2016-06-26 DIAGNOSIS — E785 Hyperlipidemia, unspecified: Secondary | ICD-10-CM | POA: Diagnosis not present

## 2016-06-26 DIAGNOSIS — I5032 Chronic diastolic (congestive) heart failure: Secondary | ICD-10-CM | POA: Diagnosis not present

## 2016-06-26 DIAGNOSIS — E669 Obesity, unspecified: Secondary | ICD-10-CM | POA: Diagnosis not present

## 2016-06-26 DIAGNOSIS — J449 Chronic obstructive pulmonary disease, unspecified: Secondary | ICD-10-CM | POA: Diagnosis not present

## 2016-06-26 DIAGNOSIS — Z7952 Long term (current) use of systemic steroids: Secondary | ICD-10-CM | POA: Diagnosis not present

## 2016-06-26 DIAGNOSIS — I110 Hypertensive heart disease with heart failure: Secondary | ICD-10-CM | POA: Diagnosis not present

## 2016-06-28 ENCOUNTER — Telehealth: Payer: Self-pay | Admitting: Internal Medicine

## 2016-06-28 DIAGNOSIS — I5032 Chronic diastolic (congestive) heart failure: Secondary | ICD-10-CM | POA: Diagnosis not present

## 2016-06-28 DIAGNOSIS — I110 Hypertensive heart disease with heart failure: Secondary | ICD-10-CM | POA: Diagnosis not present

## 2016-06-28 DIAGNOSIS — J449 Chronic obstructive pulmonary disease, unspecified: Secondary | ICD-10-CM | POA: Diagnosis not present

## 2016-06-28 DIAGNOSIS — K219 Gastro-esophageal reflux disease without esophagitis: Secondary | ICD-10-CM | POA: Diagnosis not present

## 2016-06-28 DIAGNOSIS — F411 Generalized anxiety disorder: Secondary | ICD-10-CM | POA: Diagnosis not present

## 2016-06-28 DIAGNOSIS — E119 Type 2 diabetes mellitus without complications: Secondary | ICD-10-CM | POA: Diagnosis not present

## 2016-06-28 NOTE — Telephone Encounter (Signed)
Fine with me:  30 mg daily until better then 20 mg daily

## 2016-06-28 NOTE — Telephone Encounter (Signed)
Spoke with Jeanine at [facility], states that pt c/o increased sob with any exertion- is requesting to increase from 20mg  prednisone daily to 30mg  daily.    Sending to DOD as MR has left for the day.  MW please advise if ok to increase prednisone back to 30mg  daily.  Thanks.   06/19/16 AVS with MR: Patient Instructions       ICD-9-CM ICD-10-CM    1. Chronic respiratory failure with hypoxia and hypercapnia (HCC) 518.83 J96.11      799.02 J96.12      786.09      2. Chronic obstructive pulmonary disease, unspecified COPD type ([place]) 496 J44.9    3. Mediastinal adenopathy 785.6 R59.0      Glad you are better   Do abg next few to several days while at stable state             - decide on night bipap based on abg results COntnue o2 > 4L [place] - keep pulse ox > 86% Continue prednisone at 20mg  per day Continue nebs Refer home PT/OT with advanced home care Lung FLute device on your own I think but can ask [facility] for referral Do CT chest with contrast in 3 months   Followup  in 3 months but after ct chest

## 2016-06-28 NOTE — Telephone Encounter (Signed)
Spoke with Ashby Dawes, aware of recs.  Nothing further needed.

## 2016-07-01 DIAGNOSIS — I110 Hypertensive heart disease with heart failure: Secondary | ICD-10-CM | POA: Diagnosis not present

## 2016-07-01 DIAGNOSIS — J449 Chronic obstructive pulmonary disease, unspecified: Secondary | ICD-10-CM | POA: Diagnosis not present

## 2016-07-01 DIAGNOSIS — I5032 Chronic diastolic (congestive) heart failure: Secondary | ICD-10-CM | POA: Diagnosis not present

## 2016-07-01 DIAGNOSIS — F411 Generalized anxiety disorder: Secondary | ICD-10-CM | POA: Diagnosis not present

## 2016-07-01 DIAGNOSIS — E119 Type 2 diabetes mellitus without complications: Secondary | ICD-10-CM | POA: Diagnosis not present

## 2016-07-01 DIAGNOSIS — K219 Gastro-esophageal reflux disease without esophagitis: Secondary | ICD-10-CM | POA: Diagnosis not present

## 2016-07-02 DIAGNOSIS — F411 Generalized anxiety disorder: Secondary | ICD-10-CM | POA: Diagnosis not present

## 2016-07-02 DIAGNOSIS — I5032 Chronic diastolic (congestive) heart failure: Secondary | ICD-10-CM | POA: Diagnosis not present

## 2016-07-02 DIAGNOSIS — E119 Type 2 diabetes mellitus without complications: Secondary | ICD-10-CM | POA: Diagnosis not present

## 2016-07-02 DIAGNOSIS — K219 Gastro-esophageal reflux disease without esophagitis: Secondary | ICD-10-CM | POA: Diagnosis not present

## 2016-07-02 DIAGNOSIS — I110 Hypertensive heart disease with heart failure: Secondary | ICD-10-CM | POA: Diagnosis not present

## 2016-07-02 DIAGNOSIS — J449 Chronic obstructive pulmonary disease, unspecified: Secondary | ICD-10-CM | POA: Diagnosis not present

## 2016-07-04 DIAGNOSIS — Z794 Long term (current) use of insulin: Secondary | ICD-10-CM | POA: Diagnosis not present

## 2016-07-04 DIAGNOSIS — Z9981 Dependence on supplemental oxygen: Secondary | ICD-10-CM | POA: Diagnosis not present

## 2016-07-04 DIAGNOSIS — J449 Chronic obstructive pulmonary disease, unspecified: Secondary | ICD-10-CM | POA: Diagnosis not present

## 2016-07-04 DIAGNOSIS — I5032 Chronic diastolic (congestive) heart failure: Secondary | ICD-10-CM | POA: Diagnosis not present

## 2016-07-04 DIAGNOSIS — Z7952 Long term (current) use of systemic steroids: Secondary | ICD-10-CM | POA: Diagnosis not present

## 2016-07-04 DIAGNOSIS — E785 Hyperlipidemia, unspecified: Secondary | ICD-10-CM | POA: Diagnosis not present

## 2016-07-04 DIAGNOSIS — E119 Type 2 diabetes mellitus without complications: Secondary | ICD-10-CM | POA: Diagnosis not present

## 2016-07-04 DIAGNOSIS — Z720 Tobacco use: Secondary | ICD-10-CM | POA: Diagnosis not present

## 2016-07-04 DIAGNOSIS — E669 Obesity, unspecified: Secondary | ICD-10-CM | POA: Diagnosis not present

## 2016-07-04 DIAGNOSIS — I110 Hypertensive heart disease with heart failure: Secondary | ICD-10-CM | POA: Diagnosis not present

## 2016-07-04 DIAGNOSIS — K219 Gastro-esophageal reflux disease without esophagitis: Secondary | ICD-10-CM | POA: Diagnosis not present

## 2016-07-04 DIAGNOSIS — F411 Generalized anxiety disorder: Secondary | ICD-10-CM | POA: Diagnosis not present

## 2016-07-07 ENCOUNTER — Other Ambulatory Visit: Payer: Self-pay | Admitting: Internal Medicine

## 2016-07-08 DIAGNOSIS — E119 Type 2 diabetes mellitus without complications: Secondary | ICD-10-CM | POA: Diagnosis not present

## 2016-07-08 DIAGNOSIS — K219 Gastro-esophageal reflux disease without esophagitis: Secondary | ICD-10-CM | POA: Diagnosis not present

## 2016-07-08 DIAGNOSIS — J449 Chronic obstructive pulmonary disease, unspecified: Secondary | ICD-10-CM | POA: Diagnosis not present

## 2016-07-08 DIAGNOSIS — F411 Generalized anxiety disorder: Secondary | ICD-10-CM | POA: Diagnosis not present

## 2016-07-08 DIAGNOSIS — I110 Hypertensive heart disease with heart failure: Secondary | ICD-10-CM | POA: Diagnosis not present

## 2016-07-08 DIAGNOSIS — I5032 Chronic diastolic (congestive) heart failure: Secondary | ICD-10-CM | POA: Diagnosis not present

## 2016-07-09 DIAGNOSIS — I5032 Chronic diastolic (congestive) heart failure: Secondary | ICD-10-CM | POA: Diagnosis not present

## 2016-07-09 DIAGNOSIS — K219 Gastro-esophageal reflux disease without esophagitis: Secondary | ICD-10-CM | POA: Diagnosis not present

## 2016-07-09 DIAGNOSIS — J449 Chronic obstructive pulmonary disease, unspecified: Secondary | ICD-10-CM | POA: Diagnosis not present

## 2016-07-09 DIAGNOSIS — F411 Generalized anxiety disorder: Secondary | ICD-10-CM | POA: Diagnosis not present

## 2016-07-09 DIAGNOSIS — I110 Hypertensive heart disease with heart failure: Secondary | ICD-10-CM | POA: Diagnosis not present

## 2016-07-09 DIAGNOSIS — E119 Type 2 diabetes mellitus without complications: Secondary | ICD-10-CM | POA: Diagnosis not present

## 2016-07-10 ENCOUNTER — Other Ambulatory Visit: Payer: Self-pay | Admitting: Internal Medicine

## 2016-07-11 ENCOUNTER — Other Ambulatory Visit: Payer: Self-pay | Admitting: Internal Medicine

## 2016-07-11 ENCOUNTER — Telehealth: Payer: Self-pay | Admitting: Internal Medicine

## 2016-07-11 DIAGNOSIS — K219 Gastro-esophageal reflux disease without esophagitis: Secondary | ICD-10-CM | POA: Diagnosis not present

## 2016-07-11 DIAGNOSIS — F411 Generalized anxiety disorder: Secondary | ICD-10-CM | POA: Diagnosis not present

## 2016-07-11 DIAGNOSIS — I110 Hypertensive heart disease with heart failure: Secondary | ICD-10-CM | POA: Diagnosis not present

## 2016-07-11 DIAGNOSIS — I5032 Chronic diastolic (congestive) heart failure: Secondary | ICD-10-CM | POA: Diagnosis not present

## 2016-07-11 DIAGNOSIS — E119 Type 2 diabetes mellitus without complications: Secondary | ICD-10-CM | POA: Diagnosis not present

## 2016-07-11 DIAGNOSIS — J449 Chronic obstructive pulmonary disease, unspecified: Secondary | ICD-10-CM | POA: Diagnosis not present

## 2016-07-11 MED ORDER — PREDNISONE 10 MG PO TABS: 20.0000 mg | ORAL_TABLET | Freq: Every day | ORAL | 0 refills | Status: DC

## 2016-07-11 NOTE — Telephone Encounter (Signed)
Spoke with patient, states that she is needing her Prednisone 20mg  maint dose ref[facility] to [facility] pt was advised to continue her Prednisone at 20mg    Patient Instructions    Glad you are better  Do abg next few to several days while at stable state             - decide on night bipap based on abg results COntnue o2 > 4L [place] - keep pulse ox > 86% Continue prednisone at 20mg  per day Continue nebs Refer home PT/OT with advanced home care Lung FLute device on your own I think but can ask [facility] for referral Do CT chest with contrast in 3 months  Followup  in 3 months but after ct chest     This has been sent to [place].  Nothing further needed.

## 2016-07-11 NOTE — Telephone Encounter (Signed)
LMOM TCB x1 Is she needing a refill on her maintenance 10mg  prednisone or a taper?

## 2016-07-11 NOTE — Telephone Encounter (Signed)
Pt states it is maintenance, requesting a refill...ert

## 2016-07-12 ENCOUNTER — Other Ambulatory Visit: Payer: Self-pay | Admitting: Internal Medicine

## 2016-07-12 DIAGNOSIS — I110 Hypertensive heart disease with heart failure: Secondary | ICD-10-CM | POA: Diagnosis not present

## 2016-07-12 DIAGNOSIS — J449 Chronic obstructive pulmonary disease, unspecified: Secondary | ICD-10-CM | POA: Diagnosis not present

## 2016-07-12 DIAGNOSIS — E119 Type 2 diabetes mellitus without complications: Secondary | ICD-10-CM | POA: Diagnosis not present

## 2016-07-12 DIAGNOSIS — F411 Generalized anxiety disorder: Secondary | ICD-10-CM | POA: Diagnosis not present

## 2016-07-12 DIAGNOSIS — K219 Gastro-esophageal reflux disease without esophagitis: Secondary | ICD-10-CM | POA: Diagnosis not present

## 2016-07-12 DIAGNOSIS — I5032 Chronic diastolic (congestive) heart failure: Secondary | ICD-10-CM | POA: Diagnosis not present

## 2016-07-15 DIAGNOSIS — I110 Hypertensive heart disease with heart failure: Secondary | ICD-10-CM | POA: Diagnosis not present

## 2016-07-15 DIAGNOSIS — K219 Gastro-esophageal reflux disease without esophagitis: Secondary | ICD-10-CM | POA: Diagnosis not present

## 2016-07-15 DIAGNOSIS — E119 Type 2 diabetes mellitus without complications: Secondary | ICD-10-CM | POA: Diagnosis not present

## 2016-07-15 DIAGNOSIS — F411 Generalized anxiety disorder: Secondary | ICD-10-CM | POA: Diagnosis not present

## 2016-07-15 DIAGNOSIS — I5032 Chronic diastolic (congestive) heart failure: Secondary | ICD-10-CM | POA: Diagnosis not present

## 2016-07-15 DIAGNOSIS — J449 Chronic obstructive pulmonary disease, unspecified: Secondary | ICD-10-CM | POA: Diagnosis not present

## 2016-07-18 DIAGNOSIS — I5032 Chronic diastolic (congestive) heart failure: Secondary | ICD-10-CM | POA: Diagnosis not present

## 2016-07-18 DIAGNOSIS — F411 Generalized anxiety disorder: Secondary | ICD-10-CM | POA: Diagnosis not present

## 2016-07-18 DIAGNOSIS — I110 Hypertensive heart disease with heart failure: Secondary | ICD-10-CM | POA: Diagnosis not present

## 2016-07-18 DIAGNOSIS — J449 Chronic obstructive pulmonary disease, unspecified: Secondary | ICD-10-CM | POA: Diagnosis not present

## 2016-07-18 DIAGNOSIS — K219 Gastro-esophageal reflux disease without esophagitis: Secondary | ICD-10-CM | POA: Diagnosis not present

## 2016-07-18 DIAGNOSIS — E119 Type 2 diabetes mellitus without complications: Secondary | ICD-10-CM | POA: Diagnosis not present

## 2016-07-19 DIAGNOSIS — E119 Type 2 diabetes mellitus without complications: Secondary | ICD-10-CM | POA: Diagnosis not present

## 2016-07-19 DIAGNOSIS — K219 Gastro-esophageal reflux disease without esophagitis: Secondary | ICD-10-CM | POA: Diagnosis not present

## 2016-07-19 DIAGNOSIS — I5032 Chronic diastolic (congestive) heart failure: Secondary | ICD-10-CM | POA: Diagnosis not present

## 2016-07-19 DIAGNOSIS — F411 Generalized anxiety disorder: Secondary | ICD-10-CM | POA: Diagnosis not present

## 2016-07-19 DIAGNOSIS — I110 Hypertensive heart disease with heart failure: Secondary | ICD-10-CM | POA: Diagnosis not present

## 2016-07-19 DIAGNOSIS — J449 Chronic obstructive pulmonary disease, unspecified: Secondary | ICD-10-CM | POA: Diagnosis not present

## 2016-07-23 ENCOUNTER — Other Ambulatory Visit: Payer: Self-pay | Admitting: Internal Medicine

## 2016-07-23 ENCOUNTER — Telehealth: Payer: Self-pay | Admitting: Internal Medicine

## 2016-07-23 DIAGNOSIS — J449 Chronic obstructive pulmonary disease, unspecified: Secondary | ICD-10-CM | POA: Diagnosis not present

## 2016-07-23 DIAGNOSIS — I5032 Chronic diastolic (congestive) heart failure: Secondary | ICD-10-CM | POA: Diagnosis not present

## 2016-07-23 DIAGNOSIS — K219 Gastro-esophageal reflux disease without esophagitis: Secondary | ICD-10-CM | POA: Diagnosis not present

## 2016-07-23 DIAGNOSIS — E119 Type 2 diabetes mellitus without complications: Secondary | ICD-10-CM | POA: Diagnosis not present

## 2016-07-23 DIAGNOSIS — J441 Chronic obstructive pulmonary disease with (acute) exacerbation: Secondary | ICD-10-CM

## 2016-07-23 DIAGNOSIS — F411 Generalized anxiety disorder: Secondary | ICD-10-CM | POA: Diagnosis not present

## 2016-07-23 DIAGNOSIS — J9611 Chronic respiratory failure with hypoxia: Secondary | ICD-10-CM

## 2016-07-23 DIAGNOSIS — I110 Hypertensive heart disease with heart failure: Secondary | ICD-10-CM | POA: Diagnosis not present

## 2016-07-23 NOTE — Telephone Encounter (Signed)
Palliative care referral ordered.   She should have the tooth removed.  She may be able to go to a dental school or find financial assistance for dental work.  I have no specific place I can advise.  She should have it removed - I agree it can cause an infection throughout her body

## 2016-07-23 NOTE — Telephone Encounter (Signed)
Denene from [place] called stating that she has been working with the pt for a while now. The pt has recently agreed to have a referral sent over to [facility]. Denene was wanting to check with Dr Quay Burow to see what her thoughts were on this.  She also said that the pt has one tooth towards the front that is painful and loose. Denene recommend that she go to see a dentist but the pt said that she can not afford it and will wait until it rots and falls out. Denene is very worried about this and thinks that it could eventually cause worse issues. Is there anything that Dr. Quay Burow would recommend?

## 2016-07-23 NOTE — Telephone Encounter (Signed)
Spoke with pt and Denene to inform referral has been placed. Information has been faxed to Palliative care.

## 2016-07-25 ENCOUNTER — Telehealth: Payer: Self-pay | Admitting: Internal Medicine

## 2016-07-25 DIAGNOSIS — R0689 Other abnormalities of breathing: Secondary | ICD-10-CM

## 2016-07-25 DIAGNOSIS — I5032 Chronic diastolic (congestive) heart failure: Secondary | ICD-10-CM | POA: Diagnosis not present

## 2016-07-25 DIAGNOSIS — E119 Type 2 diabetes mellitus without complications: Secondary | ICD-10-CM | POA: Diagnosis not present

## 2016-07-25 DIAGNOSIS — I110 Hypertensive heart disease with heart failure: Secondary | ICD-10-CM | POA: Diagnosis not present

## 2016-07-25 DIAGNOSIS — J449 Chronic obstructive pulmonary disease, unspecified: Secondary | ICD-10-CM | POA: Diagnosis not present

## 2016-07-25 DIAGNOSIS — K219 Gastro-esophageal reflux disease without esophagitis: Secondary | ICD-10-CM | POA: Diagnosis not present

## 2016-07-25 DIAGNOSIS — F411 Generalized anxiety disorder: Secondary | ICD-10-CM | POA: Diagnosis not present

## 2016-07-25 NOTE — Telephone Encounter (Signed)
Spoke with patient. She stated that she was seen on 06/19/16 by MR. She stated that she gave Daneil Dan forms for the [employer] to be faxed so that she could continue to have transportation. She stated that per [employer], they have not received the forms. Daneil Dan, do you by chance still have these forms?   She also stated that she was advised by MR to have her ABG levels checked. She stated that she has not heard anything since then about this test. I do see where it was mentioned in her AVS.   MR, did you want to have her ABG levels checked? Please advise. Thanks.

## 2016-07-25 NOTE — Telephone Encounter (Signed)
I signed these forms 07/25/2016 . Please go to my office room and is on my desk - of things completed. The one on my WOW is not completed. But on my desk is complete  Yes please check ABG - if she has hypercarbia I can see about getting home bipap  Dr. Brand Males, M.D., [facility].C.P Pulmonary and Critical Care Medicine Staff Physician [place] [facility] and Critical [facility] Pager: [phone], If no answer or between  15:00h - 7:00h: call [phone]  07/25/2016 11:24 PM

## 2016-07-26 NOTE — Telephone Encounter (Signed)
Spoke with patient. She is aware that the paperwork has been faxed. Advised patient that I will keep the forms for a few days in case the fax did not go through. She verbalized understanding. Nothing further needed.

## 2016-07-26 NOTE — Telephone Encounter (Signed)
Left a message for patient to call back.   [employer] forms have been signed by MR. Will fax them to the [facility] number listed on them. Order for ABG has been placed. PCCs will call the patient to have this scheduled since it will need to be done at the hospital.   Will place [employer] papers in [name]'s look-at in case the fax did not go through.

## 2016-07-29 ENCOUNTER — Telehealth: Payer: Self-pay | Admitting: Internal Medicine

## 2016-07-29 NOTE — Telephone Encounter (Signed)
Left a message to call back.

## 2016-07-30 DIAGNOSIS — I110 Hypertensive heart disease with heart failure: Secondary | ICD-10-CM | POA: Diagnosis not present

## 2016-07-30 DIAGNOSIS — E119 Type 2 diabetes mellitus without complications: Secondary | ICD-10-CM | POA: Diagnosis not present

## 2016-07-30 DIAGNOSIS — J449 Chronic obstructive pulmonary disease, unspecified: Secondary | ICD-10-CM | POA: Diagnosis not present

## 2016-07-30 DIAGNOSIS — K219 Gastro-esophageal reflux disease without esophagitis: Secondary | ICD-10-CM | POA: Diagnosis not present

## 2016-07-30 DIAGNOSIS — I5032 Chronic diastolic (congestive) heart failure: Secondary | ICD-10-CM | POA: Diagnosis not present

## 2016-07-30 DIAGNOSIS — F411 Generalized anxiety disorder: Secondary | ICD-10-CM | POA: Diagnosis not present

## 2016-07-31 MED ORDER — PREDNISONE 10 MG PO TABS: ORAL_TABLET | ORAL | 0 refills | Status: DC

## 2016-07-31 NOTE — Telephone Encounter (Signed)
I am ok with DR Quay Burow ordering palliative care. Is she upset thatI should have done pallaitive care referral before? Or does she feel I need to manage with palliative care?   Dr. Brand Males, M.D., [facility].C.P Pulmonary and Critical Care Medicine Staff Physician [place] Pulmonary and Critical Care Pager: [phone], If no answer or between  15:00h - 7:00h: call [phone]  07/31/2016 2:50 PM

## 2016-07-31 NOTE — Telephone Encounter (Signed)
Spoke with patient. She is aware of MR's message. She verbalized understanding. Nothing else needed at time of call.

## 2016-07-31 NOTE — Telephone Encounter (Signed)
Spoke with pt, concerning prednisone  Rx sent to pharmacy on 07/25/16. Per MR last OV note on 06/19/16. Pt is to continue 20mg  prednisone daily.  Rx for prednisone 10mg  sig take 2 tabs daily #30 was sent to pharmacy on 07/25/16. Pt aware that another Rx will be sent to pharmacy to complete 30 day supply.  Pt also states Dr. Quay Burow ordered PT with [facility] and PT has suggested palliative care. Pt states Dr. Quay Burow has made referral for palliative care, but feels that MR should have made this referral. I made pt aware that I would get a message over to MR to assure that he agrees with this referral.  MR please advise. Thanks.

## 2016-07-31 NOTE — Telephone Encounter (Signed)
LM x 2

## 2016-07-31 NOTE — Telephone Encounter (Signed)
Patient called back - she can be reached at [phone] -pr

## 2016-08-01 ENCOUNTER — Encounter: Payer: Self-pay | Admitting: Internal Medicine

## 2016-08-01 DIAGNOSIS — J449 Chronic obstructive pulmonary disease, unspecified: Secondary | ICD-10-CM | POA: Diagnosis not present

## 2016-08-01 DIAGNOSIS — E119 Type 2 diabetes mellitus without complications: Secondary | ICD-10-CM | POA: Diagnosis not present

## 2016-08-01 DIAGNOSIS — F411 Generalized anxiety disorder: Secondary | ICD-10-CM | POA: Diagnosis not present

## 2016-08-01 DIAGNOSIS — I5032 Chronic diastolic (congestive) heart failure: Secondary | ICD-10-CM | POA: Diagnosis not present

## 2016-08-01 DIAGNOSIS — K219 Gastro-esophageal reflux disease without esophagitis: Secondary | ICD-10-CM | POA: Diagnosis not present

## 2016-08-01 DIAGNOSIS — I110 Hypertensive heart disease with heart failure: Secondary | ICD-10-CM | POA: Diagnosis not present

## 2016-08-01 MED ORDER — PREDNISONE 10 MG PO TABS: ORAL_TABLET | ORAL | 0 refills | Status: DC

## 2016-08-05 ENCOUNTER — Other Ambulatory Visit: Payer: Self-pay | Admitting: Internal Medicine

## 2016-08-05 ENCOUNTER — Other Ambulatory Visit: Payer: Self-pay | Admitting: Adult Health

## 2016-08-06 DIAGNOSIS — J449 Chronic obstructive pulmonary disease, unspecified: Secondary | ICD-10-CM | POA: Diagnosis not present

## 2016-08-06 DIAGNOSIS — F411 Generalized anxiety disorder: Secondary | ICD-10-CM | POA: Diagnosis not present

## 2016-08-06 DIAGNOSIS — I110 Hypertensive heart disease with heart failure: Secondary | ICD-10-CM | POA: Diagnosis not present

## 2016-08-06 DIAGNOSIS — K219 Gastro-esophageal reflux disease without esophagitis: Secondary | ICD-10-CM | POA: Diagnosis not present

## 2016-08-06 DIAGNOSIS — E119 Type 2 diabetes mellitus without complications: Secondary | ICD-10-CM | POA: Diagnosis not present

## 2016-08-06 DIAGNOSIS — I5032 Chronic diastolic (congestive) heart failure: Secondary | ICD-10-CM | POA: Diagnosis not present

## 2016-08-08 DIAGNOSIS — F411 Generalized anxiety disorder: Secondary | ICD-10-CM | POA: Diagnosis not present

## 2016-08-08 DIAGNOSIS — I110 Hypertensive heart disease with heart failure: Secondary | ICD-10-CM | POA: Diagnosis not present

## 2016-08-08 DIAGNOSIS — K219 Gastro-esophageal reflux disease without esophagitis: Secondary | ICD-10-CM | POA: Diagnosis not present

## 2016-08-08 DIAGNOSIS — E119 Type 2 diabetes mellitus without complications: Secondary | ICD-10-CM | POA: Diagnosis not present

## 2016-08-08 DIAGNOSIS — I5032 Chronic diastolic (congestive) heart failure: Secondary | ICD-10-CM | POA: Diagnosis not present

## 2016-08-08 DIAGNOSIS — J449 Chronic obstructive pulmonary disease, unspecified: Secondary | ICD-10-CM | POA: Diagnosis not present

## 2016-08-12 ENCOUNTER — Encounter (HOSPITAL_COMMUNITY): Payer: Medicare Other

## 2016-08-13 DIAGNOSIS — I110 Hypertensive heart disease with heart failure: Secondary | ICD-10-CM | POA: Diagnosis not present

## 2016-08-13 DIAGNOSIS — K219 Gastro-esophageal reflux disease without esophagitis: Secondary | ICD-10-CM | POA: Diagnosis not present

## 2016-08-13 DIAGNOSIS — F411 Generalized anxiety disorder: Secondary | ICD-10-CM | POA: Diagnosis not present

## 2016-08-13 DIAGNOSIS — J449 Chronic obstructive pulmonary disease, unspecified: Secondary | ICD-10-CM | POA: Diagnosis not present

## 2016-08-13 DIAGNOSIS — E119 Type 2 diabetes mellitus without complications: Secondary | ICD-10-CM | POA: Diagnosis not present

## 2016-08-13 DIAGNOSIS — I5032 Chronic diastolic (congestive) heart failure: Secondary | ICD-10-CM | POA: Diagnosis not present

## 2016-08-15 DIAGNOSIS — I110 Hypertensive heart disease with heart failure: Secondary | ICD-10-CM | POA: Diagnosis not present

## 2016-08-15 DIAGNOSIS — K219 Gastro-esophageal reflux disease without esophagitis: Secondary | ICD-10-CM | POA: Diagnosis not present

## 2016-08-15 DIAGNOSIS — E119 Type 2 diabetes mellitus without complications: Secondary | ICD-10-CM | POA: Diagnosis not present

## 2016-08-15 DIAGNOSIS — F411 Generalized anxiety disorder: Secondary | ICD-10-CM | POA: Diagnosis not present

## 2016-08-15 DIAGNOSIS — I5032 Chronic diastolic (congestive) heart failure: Secondary | ICD-10-CM | POA: Diagnosis not present

## 2016-08-15 DIAGNOSIS — J449 Chronic obstructive pulmonary disease, unspecified: Secondary | ICD-10-CM | POA: Diagnosis not present

## 2016-08-19 ENCOUNTER — Encounter (HOSPITAL_COMMUNITY): Payer: Medicare Other

## 2016-08-20 ENCOUNTER — Other Ambulatory Visit: Payer: Self-pay | Admitting: Internal Medicine

## 2016-08-20 ENCOUNTER — Telehealth: Payer: Self-pay | Admitting: Internal Medicine

## 2016-08-20 MED ORDER — DOXYCYCLINE HYCLATE 100 MG PO TABS
100.0000 mg | ORAL_TABLET | Freq: Two times a day (BID) | ORAL | 0 refills | Status: DC
Start: 2016-08-20 — End: 2016-11-19

## 2016-08-20 NOTE — Telephone Encounter (Signed)
Doxycycline 100mg  Twice daily  For 7 days , take w/ food.  #14 , no refills Please contact office for sooner follow up if symptoms do not improve or worsen or seek emergency care ] She on prednisone 20mg  daily according to the note.

## 2016-08-20 NOTE — Telephone Encounter (Signed)
Pt aware of rec's per [name], NP Doxy called into pharmacy [facility] Pt advised by TP to increased Prednisone to 40mg  x 4 days then back down to 20mg  daily if she feels she needs it. Nothing further needed.

## 2016-08-20 NOTE — Telephone Encounter (Signed)
Called and spoke with pt and she stated that she is having a flare---she is congested with cough with beige to darker color sputum, that started yesterday but denies any fever.  She stated that she feels she needs abx and prednisone. Last ov was 06/19/2016 with MR.  TP please advise.  Thanks  Allergies  Allergen Reactions  . Ambien [Zolpidem Tartrate] Other (See Comments)    Causes nightmares

## 2016-08-23 DIAGNOSIS — F411 Generalized anxiety disorder: Secondary | ICD-10-CM | POA: Diagnosis not present

## 2016-08-23 DIAGNOSIS — E119 Type 2 diabetes mellitus without complications: Secondary | ICD-10-CM | POA: Diagnosis not present

## 2016-08-23 DIAGNOSIS — I5032 Chronic diastolic (congestive) heart failure: Secondary | ICD-10-CM | POA: Diagnosis not present

## 2016-08-23 DIAGNOSIS — K219 Gastro-esophageal reflux disease without esophagitis: Secondary | ICD-10-CM | POA: Diagnosis not present

## 2016-08-23 DIAGNOSIS — J449 Chronic obstructive pulmonary disease, unspecified: Secondary | ICD-10-CM | POA: Diagnosis not present

## 2016-08-23 DIAGNOSIS — I110 Hypertensive heart disease with heart failure: Secondary | ICD-10-CM | POA: Diagnosis not present

## 2016-08-25 DIAGNOSIS — F411 Generalized anxiety disorder: Secondary | ICD-10-CM | POA: Diagnosis not present

## 2016-08-25 DIAGNOSIS — E785 Hyperlipidemia, unspecified: Secondary | ICD-10-CM | POA: Diagnosis not present

## 2016-08-25 DIAGNOSIS — I110 Hypertensive heart disease with heart failure: Secondary | ICD-10-CM | POA: Diagnosis not present

## 2016-08-25 DIAGNOSIS — Z9981 Dependence on supplemental oxygen: Secondary | ICD-10-CM | POA: Diagnosis not present

## 2016-08-25 DIAGNOSIS — Z7951 Long term (current) use of inhaled steroids: Secondary | ICD-10-CM | POA: Diagnosis not present

## 2016-08-25 DIAGNOSIS — Z794 Long term (current) use of insulin: Secondary | ICD-10-CM | POA: Diagnosis not present

## 2016-08-25 DIAGNOSIS — Z7952 Long term (current) use of systemic steroids: Secondary | ICD-10-CM | POA: Diagnosis not present

## 2016-08-25 DIAGNOSIS — Z720 Tobacco use: Secondary | ICD-10-CM | POA: Diagnosis not present

## 2016-08-25 DIAGNOSIS — E119 Type 2 diabetes mellitus without complications: Secondary | ICD-10-CM | POA: Diagnosis not present

## 2016-08-25 DIAGNOSIS — K219 Gastro-esophageal reflux disease without esophagitis: Secondary | ICD-10-CM | POA: Diagnosis not present

## 2016-08-25 DIAGNOSIS — J449 Chronic obstructive pulmonary disease, unspecified: Secondary | ICD-10-CM | POA: Diagnosis not present

## 2016-08-25 DIAGNOSIS — E669 Obesity, unspecified: Secondary | ICD-10-CM | POA: Diagnosis not present

## 2016-08-25 DIAGNOSIS — I5032 Chronic diastolic (congestive) heart failure: Secondary | ICD-10-CM | POA: Diagnosis not present

## 2016-08-26 ENCOUNTER — Ambulatory Visit: Payer: Medicare Other | Admitting: Internal Medicine

## 2016-08-27 DIAGNOSIS — F411 Generalized anxiety disorder: Secondary | ICD-10-CM | POA: Diagnosis not present

## 2016-08-27 DIAGNOSIS — E119 Type 2 diabetes mellitus without complications: Secondary | ICD-10-CM | POA: Diagnosis not present

## 2016-08-27 DIAGNOSIS — J449 Chronic obstructive pulmonary disease, unspecified: Secondary | ICD-10-CM | POA: Diagnosis not present

## 2016-08-27 DIAGNOSIS — I5032 Chronic diastolic (congestive) heart failure: Secondary | ICD-10-CM | POA: Diagnosis not present

## 2016-08-27 DIAGNOSIS — I110 Hypertensive heart disease with heart failure: Secondary | ICD-10-CM | POA: Diagnosis not present

## 2016-08-27 DIAGNOSIS — K219 Gastro-esophageal reflux disease without esophagitis: Secondary | ICD-10-CM | POA: Diagnosis not present

## 2016-08-30 DIAGNOSIS — J449 Chronic obstructive pulmonary disease, unspecified: Secondary | ICD-10-CM | POA: Diagnosis not present

## 2016-08-30 DIAGNOSIS — E119 Type 2 diabetes mellitus without complications: Secondary | ICD-10-CM | POA: Diagnosis not present

## 2016-08-30 DIAGNOSIS — K219 Gastro-esophageal reflux disease without esophagitis: Secondary | ICD-10-CM | POA: Diagnosis not present

## 2016-08-30 DIAGNOSIS — I110 Hypertensive heart disease with heart failure: Secondary | ICD-10-CM | POA: Diagnosis not present

## 2016-08-30 DIAGNOSIS — F411 Generalized anxiety disorder: Secondary | ICD-10-CM | POA: Diagnosis not present

## 2016-08-30 DIAGNOSIS — I5032 Chronic diastolic (congestive) heart failure: Secondary | ICD-10-CM | POA: Diagnosis not present

## 2016-09-03 DIAGNOSIS — E119 Type 2 diabetes mellitus without complications: Secondary | ICD-10-CM | POA: Diagnosis not present

## 2016-09-03 DIAGNOSIS — I110 Hypertensive heart disease with heart failure: Secondary | ICD-10-CM | POA: Diagnosis not present

## 2016-09-03 DIAGNOSIS — I5032 Chronic diastolic (congestive) heart failure: Secondary | ICD-10-CM | POA: Diagnosis not present

## 2016-09-03 DIAGNOSIS — J449 Chronic obstructive pulmonary disease, unspecified: Secondary | ICD-10-CM | POA: Diagnosis not present

## 2016-09-03 DIAGNOSIS — F411 Generalized anxiety disorder: Secondary | ICD-10-CM | POA: Diagnosis not present

## 2016-09-03 DIAGNOSIS — K219 Gastro-esophageal reflux disease without esophagitis: Secondary | ICD-10-CM | POA: Diagnosis not present

## 2016-09-05 DIAGNOSIS — E669 Obesity, unspecified: Secondary | ICD-10-CM | POA: Diagnosis not present

## 2016-09-05 DIAGNOSIS — I110 Hypertensive heart disease with heart failure: Secondary | ICD-10-CM | POA: Diagnosis not present

## 2016-09-05 DIAGNOSIS — Z720 Tobacco use: Secondary | ICD-10-CM | POA: Diagnosis not present

## 2016-09-05 DIAGNOSIS — F411 Generalized anxiety disorder: Secondary | ICD-10-CM | POA: Diagnosis not present

## 2016-09-05 DIAGNOSIS — I5032 Chronic diastolic (congestive) heart failure: Secondary | ICD-10-CM | POA: Diagnosis not present

## 2016-09-05 DIAGNOSIS — J449 Chronic obstructive pulmonary disease, unspecified: Secondary | ICD-10-CM | POA: Diagnosis not present

## 2016-09-05 DIAGNOSIS — Z9981 Dependence on supplemental oxygen: Secondary | ICD-10-CM | POA: Diagnosis not present

## 2016-09-05 DIAGNOSIS — Z7952 Long term (current) use of systemic steroids: Secondary | ICD-10-CM | POA: Diagnosis not present

## 2016-09-05 DIAGNOSIS — K219 Gastro-esophageal reflux disease without esophagitis: Secondary | ICD-10-CM | POA: Diagnosis not present

## 2016-09-05 DIAGNOSIS — E785 Hyperlipidemia, unspecified: Secondary | ICD-10-CM | POA: Diagnosis not present

## 2016-09-05 DIAGNOSIS — Z794 Long term (current) use of insulin: Secondary | ICD-10-CM | POA: Diagnosis not present

## 2016-09-05 DIAGNOSIS — E119 Type 2 diabetes mellitus without complications: Secondary | ICD-10-CM | POA: Diagnosis not present

## 2016-09-06 ENCOUNTER — Telehealth: Payer: Self-pay | Admitting: Internal Medicine

## 2016-09-06 ENCOUNTER — Ambulatory Visit (HOSPITAL_COMMUNITY): Payer: Medicare Other

## 2016-09-06 DIAGNOSIS — I110 Hypertensive heart disease with heart failure: Secondary | ICD-10-CM | POA: Diagnosis not present

## 2016-09-06 DIAGNOSIS — E119 Type 2 diabetes mellitus without complications: Secondary | ICD-10-CM | POA: Diagnosis not present

## 2016-09-06 DIAGNOSIS — J449 Chronic obstructive pulmonary disease, unspecified: Secondary | ICD-10-CM | POA: Diagnosis not present

## 2016-09-06 DIAGNOSIS — K219 Gastro-esophageal reflux disease without esophagitis: Secondary | ICD-10-CM | POA: Diagnosis not present

## 2016-09-06 DIAGNOSIS — F411 Generalized anxiety disorder: Secondary | ICD-10-CM | POA: Diagnosis not present

## 2016-09-06 DIAGNOSIS — I5032 Chronic diastolic (congestive) heart failure: Secondary | ICD-10-CM | POA: Diagnosis not present

## 2016-09-06 NOTE — Telephone Encounter (Signed)
Pt states she had an anxiety attack during her ABG and was unable to perform test.  Pt wants to know if she can reschedule test and take an anti-anxiety med prior to test.    MR please advise.  Thanks.

## 2016-09-09 DIAGNOSIS — F411 Generalized anxiety disorder: Secondary | ICD-10-CM | POA: Diagnosis not present

## 2016-09-09 DIAGNOSIS — E119 Type 2 diabetes mellitus without complications: Secondary | ICD-10-CM | POA: Diagnosis not present

## 2016-09-09 DIAGNOSIS — I110 Hypertensive heart disease with heart failure: Secondary | ICD-10-CM | POA: Diagnosis not present

## 2016-09-09 DIAGNOSIS — J449 Chronic obstructive pulmonary disease, unspecified: Secondary | ICD-10-CM | POA: Diagnosis not present

## 2016-09-09 DIAGNOSIS — K219 Gastro-esophageal reflux disease without esophagitis: Secondary | ICD-10-CM | POA: Diagnosis not present

## 2016-09-09 DIAGNOSIS — I5032 Chronic diastolic (congestive) heart failure: Secondary | ICD-10-CM | POA: Diagnosis not present

## 2016-09-10 DIAGNOSIS — K219 Gastro-esophageal reflux disease without esophagitis: Secondary | ICD-10-CM | POA: Diagnosis not present

## 2016-09-10 DIAGNOSIS — I5032 Chronic diastolic (congestive) heart failure: Secondary | ICD-10-CM | POA: Diagnosis not present

## 2016-09-10 DIAGNOSIS — I110 Hypertensive heart disease with heart failure: Secondary | ICD-10-CM | POA: Diagnosis not present

## 2016-09-10 DIAGNOSIS — E119 Type 2 diabetes mellitus without complications: Secondary | ICD-10-CM | POA: Diagnosis not present

## 2016-09-10 DIAGNOSIS — J449 Chronic obstructive pulmonary disease, unspecified: Secondary | ICD-10-CM | POA: Diagnosis not present

## 2016-09-10 DIAGNOSIS — F411 Generalized anxiety disorder: Secondary | ICD-10-CM | POA: Diagnosis not present

## 2016-09-10 NOTE — Telephone Encounter (Signed)
No that can make her sleepy and can mess up ABG. I have never heard of anyone get axnous for ABG. What happened? Can you please find out  Dr. Brand Males, M.D., [facility].C.P Pulmonary and Critical Care Medicine Staff Physician [place] Pulmonary and Critical Care Pager: [phone], If no answer or between  15:00h - 7:00h: call [phone]  09/10/2016 5:06 PM

## 2016-09-10 NOTE — Telephone Encounter (Signed)
Spoke with patient. She explained that she been in the house a lot for the past few weeks. She will try again on Friday after running errands so see how her anxiety will do. Stated that she will not take any anti-anxiety meds prior to test.

## 2016-09-12 DIAGNOSIS — I110 Hypertensive heart disease with heart failure: Secondary | ICD-10-CM | POA: Diagnosis not present

## 2016-09-12 DIAGNOSIS — J449 Chronic obstructive pulmonary disease, unspecified: Secondary | ICD-10-CM | POA: Diagnosis not present

## 2016-09-12 DIAGNOSIS — E119 Type 2 diabetes mellitus without complications: Secondary | ICD-10-CM | POA: Diagnosis not present

## 2016-09-12 DIAGNOSIS — K219 Gastro-esophageal reflux disease without esophagitis: Secondary | ICD-10-CM | POA: Diagnosis not present

## 2016-09-12 DIAGNOSIS — F411 Generalized anxiety disorder: Secondary | ICD-10-CM | POA: Diagnosis not present

## 2016-09-12 DIAGNOSIS — I5032 Chronic diastolic (congestive) heart failure: Secondary | ICD-10-CM | POA: Diagnosis not present

## 2016-09-13 ENCOUNTER — Telehealth: Payer: Self-pay | Admitting: Internal Medicine

## 2016-09-13 DIAGNOSIS — I5032 Chronic diastolic (congestive) heart failure: Secondary | ICD-10-CM | POA: Diagnosis not present

## 2016-09-13 DIAGNOSIS — K219 Gastro-esophageal reflux disease without esophagitis: Secondary | ICD-10-CM | POA: Diagnosis not present

## 2016-09-13 DIAGNOSIS — E119 Type 2 diabetes mellitus without complications: Secondary | ICD-10-CM | POA: Diagnosis not present

## 2016-09-13 DIAGNOSIS — F411 Generalized anxiety disorder: Secondary | ICD-10-CM | POA: Diagnosis not present

## 2016-09-13 DIAGNOSIS — I110 Hypertensive heart disease with heart failure: Secondary | ICD-10-CM | POA: Diagnosis not present

## 2016-09-13 DIAGNOSIS — J449 Chronic obstructive pulmonary disease, unspecified: Secondary | ICD-10-CM | POA: Diagnosis not present

## 2016-09-13 MED ORDER — CEPHALEXIN 500 MG PO CAPS: 500.0000 mg | ORAL_CAPSULE | Freq: Three times a day (TID) | ORAL | 0 refills | Status: DC

## 2016-09-13 MED ORDER — PREDNISONE 10 MG PO TABS: ORAL_TABLET | ORAL | 0 refills | Status: DC

## 2016-09-13 NOTE — Telephone Encounter (Signed)
Alice Ward from [place] called requesting verbal order for a [place] Worker to be able to go out and see the pt. Danton Clap can be reached at [phone].

## 2016-09-13 NOTE — Telephone Encounter (Signed)
aecoopd  plan Please take prednisone 40 mg x1 day, then 30 mg x1 day, then 20 mg x1 day, then 10 mg x1 day, and then 5 mg x1 day and stop   cephalexin 500mg  three times daily x  5 days     Allergies  Allergen Reactions  . Ambien [Zolpidem Tartrate] Other (See Comments)    Causes nightmares

## 2016-09-13 NOTE — Telephone Encounter (Signed)
Spoke with pt. States that she is having a COPD flare up. Reports increased SOB and coughing. Cough is producing yellow mucus. Denies chest tightness, wheezing or fever. Symptoms started 2 days ago. Would like to have an antibiotic and prednisone sent in.  MR - please advise. Thanks.

## 2016-09-13 NOTE — Telephone Encounter (Signed)
Spoke with Danton Clap to give verbal orders per MD

## 2016-09-13 NOTE — Telephone Encounter (Signed)
Pt is aware of MR's recommendations and voiced her understanding.  Rx has been sent to preferred pharmacy. Nothing further needed.  

## 2016-09-16 DIAGNOSIS — K219 Gastro-esophageal reflux disease without esophagitis: Secondary | ICD-10-CM | POA: Diagnosis not present

## 2016-09-16 DIAGNOSIS — E119 Type 2 diabetes mellitus without complications: Secondary | ICD-10-CM | POA: Diagnosis not present

## 2016-09-16 DIAGNOSIS — I110 Hypertensive heart disease with heart failure: Secondary | ICD-10-CM | POA: Diagnosis not present

## 2016-09-16 DIAGNOSIS — I5032 Chronic diastolic (congestive) heart failure: Secondary | ICD-10-CM | POA: Diagnosis not present

## 2016-09-16 DIAGNOSIS — J449 Chronic obstructive pulmonary disease, unspecified: Secondary | ICD-10-CM | POA: Diagnosis not present

## 2016-09-16 DIAGNOSIS — F411 Generalized anxiety disorder: Secondary | ICD-10-CM | POA: Diagnosis not present

## 2016-09-17 ENCOUNTER — Telehealth: Payer: Self-pay | Admitting: Internal Medicine

## 2016-09-17 DIAGNOSIS — R0602 Shortness of breath: Secondary | ICD-10-CM | POA: Diagnosis not present

## 2016-09-17 DIAGNOSIS — E119 Type 2 diabetes mellitus without complications: Secondary | ICD-10-CM | POA: Diagnosis not present

## 2016-09-17 DIAGNOSIS — I110 Hypertensive heart disease with heart failure: Secondary | ICD-10-CM | POA: Diagnosis not present

## 2016-09-17 DIAGNOSIS — K219 Gastro-esophageal reflux disease without esophagitis: Secondary | ICD-10-CM | POA: Diagnosis not present

## 2016-09-17 DIAGNOSIS — J449 Chronic obstructive pulmonary disease, unspecified: Secondary | ICD-10-CM | POA: Diagnosis not present

## 2016-09-17 DIAGNOSIS — I5032 Chronic diastolic (congestive) heart failure: Secondary | ICD-10-CM | POA: Diagnosis not present

## 2016-09-17 DIAGNOSIS — F411 Generalized anxiety disorder: Secondary | ICD-10-CM | POA: Diagnosis not present

## 2016-09-17 NOTE — Telephone Encounter (Signed)
Spoke with [name] that patient can have ABG and routine lab work by [facility] in home if they are able to do so. Enid Derry will ensure that we will get results for labs.   Nothing more needed at this time.

## 2016-09-18 ENCOUNTER — Telehealth: Payer: Self-pay | Admitting: Internal Medicine

## 2016-09-18 DIAGNOSIS — I110 Hypertensive heart disease with heart failure: Secondary | ICD-10-CM | POA: Diagnosis not present

## 2016-09-18 DIAGNOSIS — I5032 Chronic diastolic (congestive) heart failure: Secondary | ICD-10-CM | POA: Diagnosis not present

## 2016-09-18 DIAGNOSIS — E119 Type 2 diabetes mellitus without complications: Secondary | ICD-10-CM | POA: Diagnosis not present

## 2016-09-18 DIAGNOSIS — J449 Chronic obstructive pulmonary disease, unspecified: Secondary | ICD-10-CM

## 2016-09-18 DIAGNOSIS — F411 Generalized anxiety disorder: Secondary | ICD-10-CM | POA: Diagnosis not present

## 2016-09-18 DIAGNOSIS — K219 Gastro-esophageal reflux disease without esophagitis: Secondary | ICD-10-CM | POA: Diagnosis not present

## 2016-09-18 NOTE — Telephone Encounter (Signed)
Spoke with [place] with CT. She is needing a BMET order placed prior to pt's CT on 09/19/2016. Order has been placed. Erline Levine is aware and will contact pt to see if she can come today for blood work.

## 2016-09-19 ENCOUNTER — Telehealth: Payer: Self-pay | Admitting: Internal Medicine

## 2016-09-19 ENCOUNTER — Inpatient Hospital Stay: Admission: RE | Admit: 2016-09-19 | Payer: Medicare Other | Source: Ambulatory Visit

## 2016-09-19 DIAGNOSIS — J449 Chronic obstructive pulmonary disease, unspecified: Secondary | ICD-10-CM | POA: Diagnosis not present

## 2016-09-19 DIAGNOSIS — I5032 Chronic diastolic (congestive) heart failure: Secondary | ICD-10-CM | POA: Diagnosis not present

## 2016-09-19 DIAGNOSIS — F411 Generalized anxiety disorder: Secondary | ICD-10-CM | POA: Diagnosis not present

## 2016-09-19 DIAGNOSIS — K219 Gastro-esophageal reflux disease without esophagitis: Secondary | ICD-10-CM | POA: Diagnosis not present

## 2016-09-19 DIAGNOSIS — I110 Hypertensive heart disease with heart failure: Secondary | ICD-10-CM | POA: Diagnosis not present

## 2016-09-19 DIAGNOSIS — E119 Type 2 diabetes mellitus without complications: Secondary | ICD-10-CM | POA: Diagnosis not present

## 2016-09-19 NOTE — Telephone Encounter (Signed)
atc pt X3, line rang to fast busy signal. Per today's OV note, pt is to remain on 20mg  prednisone daily at this time.

## 2016-09-20 NOTE — Telephone Encounter (Signed)
Called and spoke with pt and she is aware that she is to stay on the pred 20 once daily for now.  Nothing further is needed.

## 2016-09-20 NOTE — Telephone Encounter (Signed)
Pt returned call from this morning ?

## 2016-09-20 NOTE — Telephone Encounter (Signed)
lmomtcb x1 

## 2016-09-24 ENCOUNTER — Other Ambulatory Visit: Payer: Self-pay | Admitting: Adult Health

## 2016-09-24 ENCOUNTER — Other Ambulatory Visit: Payer: Self-pay | Admitting: Internal Medicine

## 2016-09-24 DIAGNOSIS — E119 Type 2 diabetes mellitus without complications: Secondary | ICD-10-CM | POA: Diagnosis not present

## 2016-09-24 DIAGNOSIS — K219 Gastro-esophageal reflux disease without esophagitis: Secondary | ICD-10-CM | POA: Diagnosis not present

## 2016-09-24 DIAGNOSIS — F411 Generalized anxiety disorder: Secondary | ICD-10-CM | POA: Diagnosis not present

## 2016-09-24 DIAGNOSIS — J449 Chronic obstructive pulmonary disease, unspecified: Secondary | ICD-10-CM | POA: Diagnosis not present

## 2016-09-24 DIAGNOSIS — I110 Hypertensive heart disease with heart failure: Secondary | ICD-10-CM | POA: Diagnosis not present

## 2016-09-24 DIAGNOSIS — I5032 Chronic diastolic (congestive) heart failure: Secondary | ICD-10-CM | POA: Diagnosis not present

## 2016-09-24 NOTE — Telephone Encounter (Signed)
Ok to fill.  rx printed 

## 2016-09-24 NOTE — Telephone Encounter (Signed)
[place] Controlled Substance Database checked. Last filled on 08/26/16

## 2016-09-25 NOTE — Telephone Encounter (Signed)
RX faxed to [facility]

## 2016-09-26 ENCOUNTER — Telehealth: Payer: Self-pay | Admitting: Internal Medicine

## 2016-09-26 ENCOUNTER — Other Ambulatory Visit (HOSPITAL_COMMUNITY)
Admission: RE | Admit: 2016-09-26 | Discharge: 2016-09-26 | Disposition: A | Discharge status: Home / Self Care | Payer: Medicare Other | Source: Other Acute Inpatient Hospital | Attending: Internal Medicine | Admitting: Internal Medicine

## 2016-09-26 ENCOUNTER — Ambulatory Visit (HOSPITAL_COMMUNITY)
Admission: RE | Admit: 2016-09-26 | Discharge: 2016-09-26 | Disposition: A | Discharge status: Home / Self Care | Payer: Medicare Other | Source: Ambulatory Visit | Attending: Internal Medicine | Admitting: Internal Medicine

## 2016-09-26 DIAGNOSIS — R0689 Other abnormalities of breathing: Secondary | ICD-10-CM

## 2016-09-26 LAB — BLOOD GAS, ARTERIAL
Acid-Base Excess: 2.2 mmol/L — ABNORMAL HIGH (ref 0.0–2.0)
BICARBONATE: 26.9 mmol/L (ref 20.0–28.0)
Drawn by: 21179
O2 Content: 5 L/min
O2 Saturation: 86.6 %
PATIENT TEMPERATURE: 98.6
PH ART: 7.402 (ref 7.350–7.450)
PO2 ART: 52.3 mmHg — AB (ref 83.0–108.0)
pCO2 arterial: 44.1 mmHg (ref 32.0–48.0)

## 2016-09-26 LAB — BASIC METABOLIC PANEL
ANION GAP: 8 (ref 5–15)
BUN: 16 mg/dL (ref 6–20)
CO2: 30 mmol/L (ref 22–32)
Calcium: 8.7 mg/dL — ABNORMAL LOW (ref 8.9–10.3)
Chloride: 104 mmol/L (ref 101–111)
Creatinine, Ser: 0.85 mg/dL (ref 0.44–1.00)
GFR calc Af Amer: >60 mL/min (ref >60)
GLUCOSE: 98 mg/dL (ref 65–99)
POTASSIUM: 3.5 mmol/L (ref 3.5–5.1)
Sodium: 142 mmol/L (ref 135–145)

## 2016-09-26 NOTE — Telephone Encounter (Signed)
Spoke with Arbie Cookey at cardiopulmonary, states that issue has been addressed and nothing further is needed.  Will close encounter.

## 2016-09-30 ENCOUNTER — Telehealth: Payer: Self-pay | Admitting: Internal Medicine

## 2016-09-30 DIAGNOSIS — J9611 Chronic respiratory failure with hypoxia: Secondary | ICD-10-CM

## 2016-09-30 NOTE — Telephone Encounter (Signed)
Spoke with patient. She stated that she only has 3 tanks of O2. Each tank will last about a hour. She wanted to know if MR would write an order to [facility] to for her to have a bigger tank so it will last longer.   She also wanted to know the results of her ABG that were done on 9/6.    MR, please advise. Thanks!

## 2016-09-30 NOTE — Telephone Encounter (Signed)
Called and spoke with pt for a brief minute and then the phone went dead.  Unable to reach the pt back   Will try back later.

## 2016-09-30 NOTE — Telephone Encounter (Signed)
Pt called back and I explained that Leigh would call her back.

## 2016-10-02 DIAGNOSIS — I110 Hypertensive heart disease with heart failure: Secondary | ICD-10-CM | POA: Diagnosis not present

## 2016-10-02 DIAGNOSIS — E119 Type 2 diabetes mellitus without complications: Secondary | ICD-10-CM | POA: Diagnosis not present

## 2016-10-02 DIAGNOSIS — F411 Generalized anxiety disorder: Secondary | ICD-10-CM | POA: Diagnosis not present

## 2016-10-02 DIAGNOSIS — J449 Chronic obstructive pulmonary disease, unspecified: Secondary | ICD-10-CM | POA: Diagnosis not present

## 2016-10-02 DIAGNOSIS — K219 Gastro-esophageal reflux disease without esophagitis: Secondary | ICD-10-CM | POA: Diagnosis not present

## 2016-10-02 DIAGNOSIS — I5032 Chronic diastolic (congestive) heart failure: Secondary | ICD-10-CM | POA: Diagnosis not present

## 2016-10-02 NOTE — Telephone Encounter (Signed)
Larger tanks ok  ABG: higher co2 resolved. Low o2 is an issue. So using o2 is the only issue   Dr. Brand Males, M.D., [facility].C.P Pulmonary and Critical Care Medicine Staff Physician [place] Pulmonary and Critical Care Pager: [phone], If no answer or between  15:00h - 7:00h: call [phone]  10/02/2016 4:18 PM      Results for [name], [name] (MRN [id number]) as of 10/02/2016 16:16  Ref. Range 09/26/2016 13:55  Sample type Unknown ARTERIAL DRAW  Delivery systems Unknown NASAL CANNULA  O2 Content Latest Units: L/min 5.0  pH, Arterial Latest Ref Range: 7.350 - 7.450  7.402  pCO2 arterial Latest Ref Range: 32.0 - 48.0 mmHg 44.1  pO2, Arterial Latest Ref Range: 83.0 - 108.0 mmHg 52.3 (L)  Acid-Base Excess Latest Ref Range: 0.0 - 2.0 mmol/L 2.2 (H)  Bicarbonate Latest Ref Range: 20.0 - 28.0 mmol/L 26.9  O2 Saturation Latest Units: % 86.6  Patient temperature Unknown 98.6  Collection site Unknown LEFT BRACHIAL  Allens test (pass/fail) Latest Ref Range: PASS  PASS

## 2016-10-02 NOTE — Telephone Encounter (Signed)
Spoke with patient. She is aware of results. Will go ahead and place order for larger o2 tanks with [facility].

## 2016-10-03 ENCOUNTER — Inpatient Hospital Stay: Admission: RE | Admit: 2016-10-03 | Payer: Medicare Other | Source: Ambulatory Visit

## 2016-10-07 ENCOUNTER — Telehealth: Payer: Self-pay | Admitting: Internal Medicine

## 2016-10-07 NOTE — Telephone Encounter (Signed)
Pt scheduled for an ROV with MR 10/08/16 but in last OV, pt was told to f/u after CT scan. Pt scheduled for a CT 10/11/16. Rescheduled pt's appt for 10/17/16 with TP at 2:45pm to discuss results of CT.  Nothing further needed at this time.

## 2016-10-08 ENCOUNTER — Ambulatory Visit: Payer: Medicare Other | Admitting: Internal Medicine

## 2016-10-10 DIAGNOSIS — F411 Generalized anxiety disorder: Secondary | ICD-10-CM | POA: Diagnosis not present

## 2016-10-10 DIAGNOSIS — J449 Chronic obstructive pulmonary disease, unspecified: Secondary | ICD-10-CM | POA: Diagnosis not present

## 2016-10-10 DIAGNOSIS — I5032 Chronic diastolic (congestive) heart failure: Secondary | ICD-10-CM | POA: Diagnosis not present

## 2016-10-10 DIAGNOSIS — I110 Hypertensive heart disease with heart failure: Secondary | ICD-10-CM | POA: Diagnosis not present

## 2016-10-10 DIAGNOSIS — K219 Gastro-esophageal reflux disease without esophagitis: Secondary | ICD-10-CM | POA: Diagnosis not present

## 2016-10-10 DIAGNOSIS — E119 Type 2 diabetes mellitus without complications: Secondary | ICD-10-CM | POA: Diagnosis not present

## 2016-10-11 ENCOUNTER — Inpatient Hospital Stay: Admission: RE | Admit: 2016-10-11 | Payer: Medicare Other | Source: Ambulatory Visit

## 2016-10-17 ENCOUNTER — Ambulatory Visit: Payer: Medicare Other | Admitting: Adult Health

## 2016-10-17 DIAGNOSIS — J449 Chronic obstructive pulmonary disease, unspecified: Secondary | ICD-10-CM | POA: Diagnosis not present

## 2016-10-17 DIAGNOSIS — I5032 Chronic diastolic (congestive) heart failure: Secondary | ICD-10-CM | POA: Diagnosis not present

## 2016-10-17 DIAGNOSIS — E119 Type 2 diabetes mellitus without complications: Secondary | ICD-10-CM | POA: Diagnosis not present

## 2016-10-17 DIAGNOSIS — K219 Gastro-esophageal reflux disease without esophagitis: Secondary | ICD-10-CM | POA: Diagnosis not present

## 2016-10-17 DIAGNOSIS — F411 Generalized anxiety disorder: Secondary | ICD-10-CM | POA: Diagnosis not present

## 2016-10-17 DIAGNOSIS — I110 Hypertensive heart disease with heart failure: Secondary | ICD-10-CM | POA: Diagnosis not present

## 2016-10-18 ENCOUNTER — Inpatient Hospital Stay: Admission: RE | Admit: 2016-10-18 | Payer: Medicare Other | Source: Ambulatory Visit

## 2016-10-19 ENCOUNTER — Other Ambulatory Visit: Payer: Self-pay | Admitting: Internal Medicine

## 2016-10-21 ENCOUNTER — Inpatient Hospital Stay: Admission: RE | Admit: 2016-10-21 | Payer: Medicare Other | Source: Ambulatory Visit

## 2016-10-22 ENCOUNTER — Other Ambulatory Visit: Payer: Self-pay | Admitting: Internal Medicine

## 2016-10-22 ENCOUNTER — Telehealth: Payer: Self-pay | Admitting: Internal Medicine

## 2016-10-22 DIAGNOSIS — I110 Hypertensive heart disease with heart failure: Secondary | ICD-10-CM | POA: Diagnosis not present

## 2016-10-22 DIAGNOSIS — E119 Type 2 diabetes mellitus without complications: Secondary | ICD-10-CM | POA: Diagnosis not present

## 2016-10-22 DIAGNOSIS — J449 Chronic obstructive pulmonary disease, unspecified: Secondary | ICD-10-CM | POA: Diagnosis not present

## 2016-10-22 DIAGNOSIS — I5032 Chronic diastolic (congestive) heart failure: Secondary | ICD-10-CM | POA: Diagnosis not present

## 2016-10-22 DIAGNOSIS — F411 Generalized anxiety disorder: Secondary | ICD-10-CM | POA: Diagnosis not present

## 2016-10-22 DIAGNOSIS — K219 Gastro-esophageal reflux disease without esophagitis: Secondary | ICD-10-CM | POA: Diagnosis not present

## 2016-10-22 NOTE — Telephone Encounter (Signed)
Ok to give verbal MR?

## 2016-10-23 NOTE — Telephone Encounter (Signed)
[place] for PT at home as requested  Dr. Brand Males, M.D., [facility].C.P Pulmonary and Critical Care Medicine Staff Physician [place] [facility]monary and Critical Care Pager: [phone], If no answer or between  15:00h - 7:00h: call [phone]  10/23/2016 2:20 PM

## 2016-10-23 NOTE — Telephone Encounter (Signed)
Called and spoke with Jeannine with [name] and she is aware of order for PT per MR>

## 2016-10-24 ENCOUNTER — Ambulatory Visit: Payer: Medicare Other | Admitting: Adult Health

## 2016-10-24 DIAGNOSIS — K219 Gastro-esophageal reflux disease without esophagitis: Secondary | ICD-10-CM | POA: Diagnosis not present

## 2016-10-24 DIAGNOSIS — Z794 Long term (current) use of insulin: Secondary | ICD-10-CM | POA: Diagnosis not present

## 2016-10-24 DIAGNOSIS — Z7952 Long term (current) use of systemic steroids: Secondary | ICD-10-CM | POA: Diagnosis not present

## 2016-10-24 DIAGNOSIS — I110 Hypertensive heart disease with heart failure: Secondary | ICD-10-CM | POA: Diagnosis not present

## 2016-10-24 DIAGNOSIS — J449 Chronic obstructive pulmonary disease, unspecified: Secondary | ICD-10-CM | POA: Diagnosis not present

## 2016-10-24 DIAGNOSIS — E669 Obesity, unspecified: Secondary | ICD-10-CM | POA: Diagnosis not present

## 2016-10-24 DIAGNOSIS — E119 Type 2 diabetes mellitus without complications: Secondary | ICD-10-CM | POA: Diagnosis not present

## 2016-10-24 DIAGNOSIS — I5032 Chronic diastolic (congestive) heart failure: Secondary | ICD-10-CM | POA: Diagnosis not present

## 2016-10-24 DIAGNOSIS — Z9981 Dependence on supplemental oxygen: Secondary | ICD-10-CM | POA: Diagnosis not present

## 2016-10-24 DIAGNOSIS — F411 Generalized anxiety disorder: Secondary | ICD-10-CM | POA: Diagnosis not present

## 2016-10-24 DIAGNOSIS — Z720 Tobacco use: Secondary | ICD-10-CM | POA: Diagnosis not present

## 2016-10-24 DIAGNOSIS — Z7951 Long term (current) use of inhaled steroids: Secondary | ICD-10-CM | POA: Diagnosis not present

## 2016-10-24 DIAGNOSIS — E785 Hyperlipidemia, unspecified: Secondary | ICD-10-CM | POA: Diagnosis not present

## 2016-10-29 ENCOUNTER — Ambulatory Visit (INDEPENDENT_AMBULATORY_CARE_PROVIDER_SITE_OTHER)
Admission: RE | Admit: 2016-10-29 | Discharge: 2016-10-29 | Disposition: A | Discharge status: Home / Self Care | Payer: Medicare Other | Source: Ambulatory Visit | Attending: Internal Medicine | Admitting: Internal Medicine

## 2016-10-29 DIAGNOSIS — J9612 Chronic respiratory failure with hypercapnia: Secondary | ICD-10-CM | POA: Diagnosis not present

## 2016-10-29 DIAGNOSIS — J9611 Chronic respiratory failure with hypoxia: Secondary | ICD-10-CM

## 2016-10-29 DIAGNOSIS — R590 Localized enlarged lymph nodes: Secondary | ICD-10-CM | POA: Diagnosis not present

## 2016-10-29 DIAGNOSIS — R599 Enlarged lymph nodes, unspecified: Secondary | ICD-10-CM | POA: Diagnosis not present

## 2016-10-29 MED ORDER — IOPAMIDOL (ISOVUE-300) INJECTION 61%
80.0000 mL | Freq: Once | INTRAVENOUS | Status: AC | PRN
  Administered 2016-10-29: 80 mL via INTRAVENOUS

## 2016-10-30 ENCOUNTER — Ambulatory Visit: Payer: Medicare Other | Admitting: Podiatry

## 2016-10-31 ENCOUNTER — Ambulatory Visit: Payer: Medicare Other | Admitting: Adult Health

## 2016-11-05 ENCOUNTER — Telehealth: Payer: Self-pay | Admitting: Internal Medicine

## 2016-11-05 NOTE — Telephone Encounter (Signed)
Spoke with pt, advised MR has to review and we will call back after he reviews. MR please advise.

## 2016-11-07 NOTE — Telephone Encounter (Signed)
MR please advise. Thanks! 

## 2016-11-08 NOTE — Telephone Encounter (Signed)
Left message for patient to call back for results.  

## 2016-11-08 NOTE — Telephone Encounter (Signed)
Let her know the below  THanks  Dr. Brand Males, M.D., [facility].C.P Pulmonary and Critical Care Medicine Staff Physician [place] [facility]monary and Critical Care Pager: [phone], If no answer or between  15:00h - 7:00h: call [phone]  11/08/2016 9:12 AM      IMPRESSION: 1. Stable appearance of mediastinal adenopathy. 2. Bilateral hilar adenopathy is identified. This is difficult to accurately compare with previous unenhanced studies, but is likely similar to 02/25/2016. 3. Small nonspecific pulmonary nodules are unchanged from previous exam. 4. Aortic Atherosclerosis (ICD10-I70.0). Lad coronary artery calcifications noted. 5. Emphysema (ICD10-J43.9).   Electronically Signed By: Kerby Moors M.D. On: 10/30/2016 08:23

## 2016-11-08 NOTE — Telephone Encounter (Signed)
Patient returned call, CB is [phone]. °

## 2016-11-11 NOTE — Telephone Encounter (Signed)
Attempted to contact pt. No answer, no option to leave a message. Will try back.  

## 2016-11-12 NOTE — Telephone Encounter (Signed)
lmtcb x1 for pt. 

## 2016-11-12 NOTE — Telephone Encounter (Signed)
lmtcb x2 for pt. 

## 2016-11-12 NOTE — Telephone Encounter (Signed)
Patient called today and left message with answering service returning our call - she can be reached at [phone]

## 2016-11-12 NOTE — Telephone Encounter (Signed)
Pt is aware of below results and voiced her understanding. Pt has been scheduled for OV with MR on 11/26/16, as previous OV for 10/31/16 was canceled.  Nothing further needed.

## 2016-11-12 NOTE — Telephone Encounter (Signed)
Pt returning [name]'s call.  [phone]

## 2016-11-13 DIAGNOSIS — I5032 Chronic diastolic (congestive) heart failure: Secondary | ICD-10-CM | POA: Diagnosis not present

## 2016-11-13 DIAGNOSIS — I110 Hypertensive heart disease with heart failure: Secondary | ICD-10-CM | POA: Diagnosis not present

## 2016-11-13 DIAGNOSIS — K219 Gastro-esophageal reflux disease without esophagitis: Secondary | ICD-10-CM | POA: Diagnosis not present

## 2016-11-13 DIAGNOSIS — F411 Generalized anxiety disorder: Secondary | ICD-10-CM | POA: Diagnosis not present

## 2016-11-13 DIAGNOSIS — J449 Chronic obstructive pulmonary disease, unspecified: Secondary | ICD-10-CM | POA: Diagnosis not present

## 2016-11-13 DIAGNOSIS — E119 Type 2 diabetes mellitus without complications: Secondary | ICD-10-CM | POA: Diagnosis not present

## 2016-11-19 ENCOUNTER — Telehealth: Payer: Self-pay | Admitting: Internal Medicine

## 2016-11-19 DIAGNOSIS — Z7952 Long term (current) use of systemic steroids: Secondary | ICD-10-CM | POA: Diagnosis not present

## 2016-11-19 DIAGNOSIS — Z720 Tobacco use: Secondary | ICD-10-CM | POA: Diagnosis not present

## 2016-11-19 DIAGNOSIS — Z794 Long term (current) use of insulin: Secondary | ICD-10-CM | POA: Diagnosis not present

## 2016-11-19 DIAGNOSIS — K219 Gastro-esophageal reflux disease without esophagitis: Secondary | ICD-10-CM | POA: Diagnosis not present

## 2016-11-19 DIAGNOSIS — E119 Type 2 diabetes mellitus without complications: Secondary | ICD-10-CM | POA: Diagnosis not present

## 2016-11-19 DIAGNOSIS — I5032 Chronic diastolic (congestive) heart failure: Secondary | ICD-10-CM | POA: Diagnosis not present

## 2016-11-19 DIAGNOSIS — E785 Hyperlipidemia, unspecified: Secondary | ICD-10-CM | POA: Diagnosis not present

## 2016-11-19 DIAGNOSIS — J449 Chronic obstructive pulmonary disease, unspecified: Secondary | ICD-10-CM | POA: Diagnosis not present

## 2016-11-19 DIAGNOSIS — E669 Obesity, unspecified: Secondary | ICD-10-CM | POA: Diagnosis not present

## 2016-11-19 DIAGNOSIS — F411 Generalized anxiety disorder: Secondary | ICD-10-CM | POA: Diagnosis not present

## 2016-11-19 DIAGNOSIS — I110 Hypertensive heart disease with heart failure: Secondary | ICD-10-CM | POA: Diagnosis not present

## 2016-11-19 DIAGNOSIS — Z9981 Dependence on supplemental oxygen: Secondary | ICD-10-CM | POA: Diagnosis not present

## 2016-11-19 MED ORDER — DOXYCYCLINE HYCLATE 100 MG PO TABS: 100.0000 mg | ORAL_TABLET | Freq: Two times a day (BID) | ORAL | 0 refills | Status: DC

## 2016-11-19 MED ORDER — PREDNISONE 20 MG PO TABS: ORAL_TABLET | ORAL | 0 refills | Status: DC

## 2016-11-19 NOTE — Telephone Encounter (Signed)
Doxycycline 100mg  Twice daily  For 7 days . - take with food .  mucinex DM Twice daily  As needed  Cough /congestion  Increase Prednisone 40mg .daily for 3 days then 20mg  daily.  Please contact office for sooner follow up if symptoms do not improve or worsen or seek emergency care

## 2016-11-19 NOTE — Telephone Encounter (Signed)
Left message for NP to call back.

## 2016-11-19 NOTE — Telephone Encounter (Signed)
Spoke with the pt and notified of recs per MR and she verbalized understanding  Rxs were sent to pharm

## 2016-11-19 NOTE — Telephone Encounter (Signed)
Spoke with the pt She is c/o fatigue, cough with minimal light yellow sputum, and increased SOB x 2 days  She has not had any f/c/s or other co's She is still on pred 20 mg as maintenance  I offered appt but she lacks transportation  She has ov pending with MR for 11/26/16  Will forward to TP since MR out of the office according to [place]  Please advise thanks

## 2016-11-19 NOTE — Telephone Encounter (Signed)
Spoke with NP, she states she is already on 6 liters of oxygen. She states she is having increased sob at rest and with exertion, chest congestion, productive cough, and sinus congestion. She just wanted to let us know that she is susceptible to pneumonia and infections and wants to keep her out of the hospital. [name]

## 2016-11-19 NOTE — Telephone Encounter (Signed)
[name], NP with Hospice calling regarding patient.  Advised the patient had called and note is put back but she would like to speak with nurse.  CB is [phone].

## 2016-11-20 DIAGNOSIS — J449 Chronic obstructive pulmonary disease, unspecified: Secondary | ICD-10-CM | POA: Diagnosis not present

## 2016-11-20 DIAGNOSIS — R0602 Shortness of breath: Secondary | ICD-10-CM | POA: Diagnosis not present

## 2016-11-20 DIAGNOSIS — I5032 Chronic diastolic (congestive) heart failure: Secondary | ICD-10-CM | POA: Diagnosis not present

## 2016-11-20 DIAGNOSIS — F411 Generalized anxiety disorder: Secondary | ICD-10-CM | POA: Diagnosis not present

## 2016-11-20 DIAGNOSIS — E119 Type 2 diabetes mellitus without complications: Secondary | ICD-10-CM | POA: Diagnosis not present

## 2016-11-20 DIAGNOSIS — I110 Hypertensive heart disease with heart failure: Secondary | ICD-10-CM | POA: Diagnosis not present

## 2016-11-20 DIAGNOSIS — K219 Gastro-esophageal reflux disease without esophagitis: Secondary | ICD-10-CM | POA: Diagnosis not present

## 2016-11-26 ENCOUNTER — Ambulatory Visit: Payer: Medicare Other | Admitting: Internal Medicine

## 2016-12-06 DIAGNOSIS — E119 Type 2 diabetes mellitus without complications: Secondary | ICD-10-CM | POA: Diagnosis not present

## 2016-12-06 DIAGNOSIS — I5032 Chronic diastolic (congestive) heart failure: Secondary | ICD-10-CM | POA: Diagnosis not present

## 2016-12-06 DIAGNOSIS — K219 Gastro-esophageal reflux disease without esophagitis: Secondary | ICD-10-CM | POA: Diagnosis not present

## 2016-12-06 DIAGNOSIS — I110 Hypertensive heart disease with heart failure: Secondary | ICD-10-CM | POA: Diagnosis not present

## 2016-12-06 DIAGNOSIS — J449 Chronic obstructive pulmonary disease, unspecified: Secondary | ICD-10-CM | POA: Diagnosis not present

## 2016-12-06 DIAGNOSIS — F411 Generalized anxiety disorder: Secondary | ICD-10-CM | POA: Diagnosis not present

## 2016-12-09 ENCOUNTER — Telehealth: Payer: Self-pay | Admitting: Internal Medicine

## 2016-12-09 NOTE — Telephone Encounter (Signed)
Notes recorded by Brand Males, MD on 12/06/2016 at 5:05 PM EST Ct chest stable. Will seee her end of nov 2018 for folllowup -------- Spoke with pt, aware of results/recs.  Nothing further needed.

## 2016-12-12 ENCOUNTER — Other Ambulatory Visit: Payer: Self-pay | Admitting: Internal Medicine

## 2016-12-13 ENCOUNTER — Other Ambulatory Visit: Payer: Self-pay | Admitting: Adult Health

## 2016-12-13 ENCOUNTER — Telehealth: Payer: Self-pay | Admitting: Internal Medicine

## 2016-12-13 DIAGNOSIS — K219 Gastro-esophageal reflux disease without esophagitis: Secondary | ICD-10-CM | POA: Diagnosis not present

## 2016-12-13 DIAGNOSIS — F411 Generalized anxiety disorder: Secondary | ICD-10-CM | POA: Diagnosis not present

## 2016-12-13 DIAGNOSIS — I110 Hypertensive heart disease with heart failure: Secondary | ICD-10-CM | POA: Diagnosis not present

## 2016-12-13 DIAGNOSIS — I5032 Chronic diastolic (congestive) heart failure: Secondary | ICD-10-CM | POA: Diagnosis not present

## 2016-12-13 DIAGNOSIS — J449 Chronic obstructive pulmonary disease, unspecified: Secondary | ICD-10-CM | POA: Diagnosis not present

## 2016-12-13 DIAGNOSIS — E119 Type 2 diabetes mellitus without complications: Secondary | ICD-10-CM | POA: Diagnosis not present

## 2016-12-16 DIAGNOSIS — I110 Hypertensive heart disease with heart failure: Secondary | ICD-10-CM | POA: Diagnosis not present

## 2016-12-16 DIAGNOSIS — K219 Gastro-esophageal reflux disease without esophagitis: Secondary | ICD-10-CM | POA: Diagnosis not present

## 2016-12-16 DIAGNOSIS — I5032 Chronic diastolic (congestive) heart failure: Secondary | ICD-10-CM | POA: Diagnosis not present

## 2016-12-16 DIAGNOSIS — F411 Generalized anxiety disorder: Secondary | ICD-10-CM | POA: Diagnosis not present

## 2016-12-16 DIAGNOSIS — E119 Type 2 diabetes mellitus without complications: Secondary | ICD-10-CM | POA: Diagnosis not present

## 2016-12-16 DIAGNOSIS — J449 Chronic obstructive pulmonary disease, unspecified: Secondary | ICD-10-CM | POA: Diagnosis not present

## 2016-12-16 MED ORDER — GLUCOSE BLOOD VI STRP: ORAL_STRIP | 3 refills | Status: DC

## 2016-12-16 NOTE — Telephone Encounter (Signed)
Patient is requesting call back in regard to this.  Patient can be reached at [phone].

## 2016-12-16 NOTE — Addendum Note (Signed)
Addended by: Terence Lux B on: 12/16/2016 04:52 PM   Modules accepted: Orders

## 2016-12-16 NOTE — Telephone Encounter (Signed)
Spoke with pt, [facility]

## 2016-12-17 ENCOUNTER — Ambulatory Visit: Payer: Medicare Other | Admitting: Internal Medicine

## 2016-12-17 ENCOUNTER — Telehealth: Payer: Self-pay | Admitting: Internal Medicine

## 2016-12-17 MED ORDER — PREDNISONE 10 MG PO TABS: ORAL_TABLET | ORAL | 0 refills | Status: DC

## 2016-12-17 MED ORDER — CEPHALEXIN 500 MG PO CAPS: 500.0000 mg | ORAL_CAPSULE | Freq: Three times a day (TID) | ORAL | 0 refills | Status: DC

## 2016-12-17 NOTE — Telephone Encounter (Signed)
Patient calling back regarding this - she can be reached at [phone] -pr

## 2016-12-17 NOTE — Telephone Encounter (Signed)
[name] is aware of MR's below message. Nothing further needed.

## 2016-12-17 NOTE — Telephone Encounter (Signed)
Pt aware that MR had clinic this morning, and once we have a response we will contact her

## 2016-12-17 NOTE — Telephone Encounter (Signed)
AECOPD  Plan cephalexin 500mg  three times daily x  5 days  Please take prednisone 40 mg x1 day, then 30 mg x1 day, then 20 mg x1 day, then 10 mg x1 day, and then 5 mg x1 day and stop   Dr. Brand Males, M.D., [facility].C.P Pulmonary and Critical Care Medicine Staff Physician, [place] Director - Interstitial Lung Disease  Program  Pulmonary [place] at [place], [place], [address]  Pager: [phone], If no answer or between  15:00h - 7:00h: call [phone] Telephone: [phone]

## 2016-12-17 NOTE — Telephone Encounter (Signed)
Pt is aware of MR's recommendations and voiced her understanding.  Rx has been sent to preferred pharmacy. Nothing further needed.  

## 2016-12-17 NOTE — Telephone Encounter (Signed)
Yes that is fine with me  Dr. Brand Males, M.D., [facility].C.P Pulmonary and Critical Care Medicine Staff Physician, [place] Director - Interstitial Lung Disease  Program  Pulmonary [place] at [place], [place], [address]  Pager: [phone], If no answer or between  15:00h - 7:00h: call [phone] Telephone: [phone]

## 2016-12-17 NOTE — Telephone Encounter (Signed)
Called and spoke to pt. Pt states shecanceled OV for today due not not being able to ride bus with cough. Pt reports of prod cough with yellow mucus, increased sob & nasal drainage yellow in color x2d. Pt [facility] [address]  MR please advise.

## 2016-12-17 NOTE — Telephone Encounter (Signed)
[place] for PT once a week for 8 weeks? MR please advise.

## 2016-12-18 ENCOUNTER — Other Ambulatory Visit: Payer: Self-pay | Admitting: Emergency Medicine

## 2016-12-18 MED ORDER — FLUTICASONE PROPIONATE 50 MCG/ACT NA SUSP: NASAL | 1 refills | Status: DC

## 2016-12-21 ENCOUNTER — Other Ambulatory Visit: Payer: Self-pay | Admitting: Adult Health

## 2016-12-23 ENCOUNTER — Encounter: Payer: Self-pay | Admitting: Internal Medicine

## 2016-12-23 ENCOUNTER — Ambulatory Visit (INDEPENDENT_AMBULATORY_CARE_PROVIDER_SITE_OTHER): Payer: Medicare Other | Admitting: Internal Medicine

## 2016-12-23 VITALS — BP 112/70 | HR 103 | Ht 64.0 in | Wt 228.6 lb

## 2016-12-23 DIAGNOSIS — R590 Localized enlarged lymph nodes: Secondary | ICD-10-CM

## 2016-12-23 DIAGNOSIS — J9611 Chronic respiratory failure with hypoxia: Secondary | ICD-10-CM | POA: Diagnosis not present

## 2016-12-23 MED ORDER — PREDNISONE 20 MG PO TABS: 20.0000 mg | ORAL_TABLET | Freq: Every day | ORAL | 1 refills | Status: DC

## 2016-12-23 MED ORDER — FLUTICASONE-UMECLIDIN-VILANT 100-62.5-25 MCG/INH IN AEPB: 1.0000 | INHALATION_SPRAY | Freq: Every day | RESPIRATORY_TRACT | 5 refills | Status: DC

## 2016-12-23 NOTE — Progress Notes (Signed)
Subjective:     Patient ID: [name], female   DOB: 1945/09/12, 71 y.o.   MRN: [id number]  HPI  PCP Binnie Rail, MD   HPI   OV 06/06/2016   Chief Complaint  Patient presents with  . Acute Visit    increased SOB x1 week, tightness in chest, slight wheezing, weakness/fatigue, prod cough with beige mucus in the mornings.  denies any f/c/s, hemoptysis,     [name]  female with chronic hypoxemic respiratory failure chronically on prednisone. In the past she had mediastinal adenopathy did seem to be improving. The base of chronic hypoxemic respiratory failure COPD/bronchiectasis. She's currently on 4-5 liters of oxygen. Then in February 2018 had an admission for COPD exacerbation. Although CT scan of chest at that time did show consolidation but she is not aware of pneumonia diagnosis. Since then she's been on 6 L oxygen. She feels baseline prednisone dose is 20 mg below which she gets symptomatic. She ran out of her prednisone last week and apparently only 10 mg was called in. After that she started noticing more symptoms with fatigue shortness of breath. Is also change in cough with slight change in discoloration of sputum. When she walked and she needed 8 L of oxygen to correct the hypoxemia according to the intake nurse. However she denies that she is sick that she needs hospitalization. She is adamantly refusing hospitalization. She does have slight worsening in her pedal edema compared to baseline. There is no DVT symptoms otherwise.     OV 06/19/2016  Chief Complaint  Patient presents with  . Follow-up    Pt states her SOB has slightly improved since last OV but is not back to baseline. Pt c/o prod cough with cloudy mucus - pt states it is worse at night and in the morning.    Follow-up chronic hypoxic respiratory failure chronically on prednisone. Saw her in 06/06/2016 during an acute exacerbation. She declined admission. We had treated with outpatient antibiotic and  prednisone. She feels 20 mg of prednisone is the lowest she can go. Review of the chart actually shows she has chronic hypercapnic respiratory failure as well. She still feels fatigued. She's not using nocturnal ventilation. Currently in the office today although she feels better she didn't desaturate to 80% while ambulating on 4 L of oxygen. Most recent chest x-ray and labs were all okay. This no sputum or fever at this point in time.  OV 12/23/2016  Chief Complaint  Patient presents with  . Follow-up    Pt developed a head cold x1 week ago which went to her chest. Had been on z pak and prednisone and when finished meds, symptoms started all over again. Pt became SOB when waiting for [employer] bus and driver called EMS due to SOB and sats being low. Sats had gone back to 90s by the time EMS arrived.     S: Baseline cOPD and chronic resp failure with CAT score 27 as below. Uses 4L O2 and is prednisone depdentn at 20mg  per day. Recent flareup and we tapered her prednisone just for the first 25 mg and she is feeling awful. She'll go back to 20 mg prednisone per day starting tomorrow. She is worried that she's having always prednisone withdrawal symptoms but understands that this is her. She did have a CT chest that shows stability in her mediastinal adenopathy. She does feel oxygen level is low but this is baseline. She wants to try home physical therapy again. She  wants an inhaler change. She wants larger portable oxygen system. She also wants to have a better Medicare payment plan that will allow her to be seen in this practice  CAT COPD Symptom & Quality of Life Score ([employer] trademark) 0 is no burden. 5 is highest burden 12/23/2016   Never Cough -> Cough all the time 3  No phlegm in chest -> Chest is full of phlegm 4  No chest tightness -> Chest feels very tight 0  No dyspnea for 1 flight stairs/hill -> Very dyspneic for 1 flight of stairs 5  No limitations for ADL at home -> Very limited with ADL at home  4  Confident leaving home -> Not at all confident leaving home 3  Sleep soundly -> Do not sleep soundly because of lung condition 3  Lots of Energy -> No energy at all 5  TOTAL Score (max 40)  27       Results for [name], [name] (MRN [id number]) as of 06/19/2016 12:23  Ref. Range 07/29/2013 08:30  FEV1-Post Latest Units: L 1.00  FEV1-%Pred-Post Latest Units: % 53  FEV1-%Change-Post Latest Units: % 12    Results for [name], [name] (MRN [id number]) as of 12/23/2016 12:27  Ref. Range 09/26/2016 13:55  O2 Content Latest Units: L/min 5.0  pH, Arterial Latest Ref Range: 7.350 - 7.450  7.402  pCO2 arterial Latest Ref Range: 32.0 - 48.0 mmHg 44.1  pO2, Arterial Latest Ref Range: 83.0 - 108.0 mmHg 52.3 (L)   CT chest IMPRESSION: 1. Stable appearance of mediastinal adenopathy. 2. Bilateral hilar adenopathy is identified. This is difficult to accurately compare with previous unenhanced studies, but is likely similar to 02/25/2016. 3. Small nonspecific pulmonary nodules are unchanged from previous exam. 4. Aortic Atherosclerosis (ICD10-I70.0). Lad coronary artery calcifications noted. 5.  Emphysema (ICD10-J43.9).   Electronically Signed   By: Kerby Moors M.D.   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   reports that she has been smoking cigarettes.  She has a 22.00 pack-year smoking history. she has never used smokeless tobacco.  Past Surgical History:   Procedure Laterality Date  . TOTAL ABDOMINAL HYSTERECTOMY      Allergies  Allergen Reactions  . Ambien [Zolpidem Tartrate] Other (See Comments)    Causes nightmares    Immunization History  Administered Date(s) Administered  . Influenza Split 11/12/2010  . Influenza, High Dose Seasonal PF 11/09/2012, 10/31/2015  . Influenza,inj,Quad PF,6+ Mos 10/20/2014  . Pneumococcal Conjugate-13 05/17/2013  . Pneumococcal Polysaccharide-23 03/22/2010, 06/24/2016    Family History  Problem Relation Age of Onset  . Other Father        MVA  . Esophageal cancer Mother      Current Outpatient Medications:  .  albuterol (PROVENTIL HFA;VENTOLIN HFA) 108 (90 Base) MCG/ACT inhaler, Inhale 2 puffs into the lungs every 6 (six) hours as needed for wheezing or shortness of breath., Disp: 1 Inhaler, Rfl: 5 .  albuterol (PROVENTIL) (2.5 MG/3ML) 0.083% nebulizer solution, USE ONE VIAL IN NEBULIZER EVERY 6 HOURS AS NEEDED FOR WHEEZING, Disp: 75 mL, Rfl: 11 .  ALPRAZolam Duanne Moron)  0.5 MG tablet, TAKE 1 TABLET BY MOUTH THREE TIMES A DAY AS NEEDED FOR ANXIETY, Disp: 90 tablet, Rfl: 2 .  fluticasone (FLONASE) 50 MCG/ACT nasal spray, USE 2 SPRAYS IN EACH NOSTRIL EVERY DAY AS NEEDED FOR ALLERGIES AND RHINITIS, Disp: 48 g, Rfl: 1 .  furosemide (LASIX) 40 MG tablet, TAKE 1 TABLET (40 MG TOTAL) BY MOUTH DAILY., Disp: 90 tablet, Rfl: 1 .  GLUCOCOM LANCETS 33G MISC, Use with Test Strips to take blood sugars, Disp: 100 each, Rfl: 5 .  glucose blood (BAYER CONTOUR NEXT TEST) test strip, Use to take blood sugars twice daily., Disp: 100 each, Rfl: 3 .  Insulin Glargine (LANTUS SOLOSTAR) 100 UNIT/ML Solostar Pen, Inject 22 Units into the skin daily at 10 pm., Disp: 5 pen, Rfl: 11 .  Insulin Pen Needle (BD PEN NEEDLE NANO U/F) 32G X 4 MM MISC, USE TO ADMINISTER LANTUS INSULIN, Disp: 100 each, Rfl: 3 .  LANTUS SOLOSTAR 100 UNIT/ML Solostar Pen, INJECT 32 UNITS INTO THE SKIN DAILY AT 10 PM., Disp: 5 pen, Rfl: 6 .  pantoprazole  (PROTONIX) 40 MG tablet, Take 1 tablet (40 mg total) by mouth daily., Disp: 30 tablet, Rfl: 0 .  PARoxetine (PAXIL) 20 MG tablet, TAKE 1 TABLET BY MOUTH EVERY DAY, Disp: 30 tablet, Rfl: 5 .  Fluticasone-Umeclidin-Vilant (TRELEGY ELLIPTA) 100-62.5-25 MCG/INH AEPB, Inhale 1 puff into the lungs daily., Disp: 1 each, Rfl: 5 .  predniSONE (DELTASONE) 20 MG tablet, Take 1 tablet (20 mg total) by mouth daily with breakfast., Disp: 90 tablet, Rfl: 1   Review of Systems     Objective:   Physical Exam  Vitals:   12/23/16 1228  BP: 112/70  Pulse: (!) 103  SpO2: 92%  Weight: 228 lb 9.6 oz (103.7 kg)  Height: 5\' 4"  (1.626 m)     Estimated body mass index is 39.24 kg/m as calculated from the following:   Height as of this encounter: 5\' 4"  (1.626 m).   Weight as of this encounter: 228 lb 9.6 oz (103.7 kg). Obese female smells of tobacco sitting in the chair uses a walker has oxygen tank on. Purse lip breathing at baseline or wheezing mildly labored which is baseline. No edema normal heart sounds and no crackles no wheeze alert and oriented 3     Assessment:       ICD-10-CM   1. Chronic respiratory failure with hypoxia (HCC) J96.11 Ambulatory referral to Physical Therapy    Ambulatory referral to Occupational Therapy    Ambulatory Referral for DME  2. Mediastinal adenopathy R59.0        Plan:      Glad you are better/stable but having significant symptom burden Chest lymph gland is stable DO not know much about CBD oil for copd  PLAN COntnue o2 > 4L [place] - keep pulse ox > 86%; meet [facility] / do DME referral to get larger tank Restart and Continue prednisone at 20mg  per day Continue nebs alb as needed Change spiriva/symbicort to TRELEGY once daily Re-Refer home PT/OT with [facility] Take insurance information for [employer] advantage plan via [place] Quit smoking  Followup  in 3 months or sooner if needed    Dr. Brand Males, M.D., [facility].C.P Pulmonary and Critical  Care Medicine Staff Physician, [place] Director - Interstitial Lung Disease  Program  Pulmonary [place] at [place], [place], [address]  Pager: [phone], If no answer or between  15:00h -  7:00h: call [phone] Telephone: [phone]

## 2016-12-23 NOTE — Patient Instructions (Addendum)
ICD-9-CM ICD-10-CM   1. Chronic respiratory failure with hypoxia and hypercapnia (HCC) 518.83 J96.11    799.02 J96.12    786.09    2. Chronic obstructive pulmonary disease, unspecified COPD type ([place]) 496 J44.9   3. Mediastinal adenopathy 785.6 R59.0    Glad you are better/stable but having significant symptom burden Chest lymph gland is stable DO not know much about CBD oil for copd  PLAN COntnue o2 > 4L  - keep pulse ox > 86%; meet [facility] / do DME referral to get larger tank Restart and Continue prednisone at 20mg  per day Continue nebs alb as needed Change spiriva/symbicort to TRELEGY once daily Re-Refer home PT/OT with [facility] Take insurance information for [employer] advantage plan via [place] Quit smoking  Followup  in 3 months or sooner if needed

## 2017-01-17 DIAGNOSIS — R0602 Shortness of breath: Secondary | ICD-10-CM | POA: Diagnosis not present

## 2017-01-22 ENCOUNTER — Telehealth: Payer: Self-pay | Admitting: Internal Medicine

## 2017-01-22 MED ORDER — DOXYCYCLINE HYCLATE 100 MG PO TABS: 100.0000 mg | ORAL_TABLET | Freq: Two times a day (BID) | ORAL | 0 refills | Status: DC

## 2017-01-22 MED ORDER — PREDNISONE 10 MG PO TABS: ORAL_TABLET | ORAL | 0 refills | Status: DC

## 2017-01-22 NOTE — Telephone Encounter (Signed)
Spoke with pt. She is aware of MR's recommendations. Rxs have been sent in. Nothing further was needed. 

## 2017-01-22 NOTE — Telephone Encounter (Signed)
Spoke with pt. States that she is having a COPD flare up. Reports chest congestion, cough and SOB. Cough is producing an off white colored mucus. Denies chest tightness, wheezing or fever. Symptoms started 2 days ago. Would like to have something sent in.  MR - please advise. Thanks.

## 2017-01-22 NOTE — Telephone Encounter (Signed)
AECOPD  Take prednisone 40 mg daily x 2 days, then 20mg  daily x 2 days, then 10mg  daily x 2 days, then 5mg  daily x 2 days and stop/go to baseline dose  Take doxycycline 100mg  po twice daily x 5 days; take after meals and avoid sunlight  Dr. Brand Males, M.D., [facility].C.P Pulmonary and Critical Care Medicine Staff Physician, [place] Director - Interstitial Lung Disease  Program  Pulmonary [place] at [place], [place], [address]  Pager: [phone], If no answer or between  15:00h - 7:00h: call [phone] Telephone: [phone]

## 2017-03-10 ENCOUNTER — Telehealth: Payer: Self-pay | Admitting: Internal Medicine

## 2017-03-10 MED ORDER — DOXYCYCLINE HYCLATE 100 MG PO TABS: ORAL_TABLET | ORAL | 0 refills | Status: DC

## 2017-03-10 MED ORDER — PREDNISONE 10 MG PO TABS: ORAL_TABLET | ORAL | 0 refills | Status: DC

## 2017-03-10 NOTE — Telephone Encounter (Signed)
Spoke with patient. He is aware of CY's recs. Will go ahead and call in medication for her. Nothing else needed at time of call.

## 2017-03-10 NOTE — Telephone Encounter (Signed)
Prednisone 10 mg, # 20, 4 X 2 DAYS, 3 X 2 DAYS, 2 X 2 DAYS, 1 X 2 DAYS  Doxycycline 100 mg, # 8, 2 today then one daily

## 2017-03-10 NOTE — Telephone Encounter (Signed)
CY please advise on this, patient is having a COPD flare up and would like something called in to help this. Patient has no energy, increased SOB

## 2017-03-16 ENCOUNTER — Other Ambulatory Visit: Payer: Self-pay | Admitting: Internal Medicine

## 2017-03-17 ENCOUNTER — Telehealth: Payer: Self-pay | Admitting: Internal Medicine

## 2017-03-17 MED ORDER — CEPHALEXIN 500 MG PO CAPS: 500.0000 mg | ORAL_CAPSULE | Freq: Three times a day (TID) | ORAL | 0 refills | Status: DC

## 2017-03-17 MED ORDER — PREDNISONE 10 MG PO TABS: ORAL_TABLET | ORAL | 0 refills | Status: DC

## 2017-03-17 NOTE — Telephone Encounter (Signed)
MR please advise,   patient finished both medications and is still not feeling well. Patient is complaining of cough, congestion, weakness. No body aches, no fever. Patients o2 has stayed between 93-95 but she is having SOB. Her heart rate is around 111.

## 2017-03-17 NOTE — Telephone Encounter (Signed)
She needs a f/u appt.

## 2017-03-17 NOTE — Telephone Encounter (Signed)
2 similar Rx with doxy and pred since new year 2019.  Plan  - need to come in 03/17/2017 or 03/18/17  - Till then she can try cephalexin 500mg  three times daily x  5 days and repeat prednisone Take prednisone 40 mg daily x 2 days, then 20mg  daily x 2 days, then 10mg  daily x 2 days, then 5mg  daily x 2 days and stop  - Go to ER if unwell/getting worse   Dr. Brand Males, M.D., [facility].C.P Pulmonary and Critical Care Medicine Staff Physician, [place] Director - Interstitial Lung Disease  Program  Pulmonary [place] at [place], [place], [address]  Pager: [phone], If no answer or between  15:00h - 7:00h: call [phone] Telephone: [phone]

## 2017-03-17 NOTE — Telephone Encounter (Signed)
[place] Controlled Substance Database checked. Last filled on 02/20/17 

## 2017-03-17 NOTE — Telephone Encounter (Signed)
Called pt and stated to her that MR wanted her to come in for a visit due to her not being better after doxy and pred.  Also stated to her that he was going to send an Rx of cephalexin and another round of pred for her to take until she comes in for a visit.  Pt unable to come in until Thursday due to transportation issues.  Acute visit scheduled for pt Thurs. At 3:15 with MW.  Scripts of cephalexin and pred sent to pt's pharmacy.  Nothing further needed.

## 2017-03-18 ENCOUNTER — Other Ambulatory Visit: Payer: Self-pay | Admitting: Emergency Medicine

## 2017-03-18 MED ORDER — INSULIN GLARGINE 100 UNIT/ML SOLOSTAR PEN: 32.0000 [IU] | PEN_INJECTOR | Freq: Every day | SUBCUTANEOUS | 0 refills | Status: DC

## 2017-03-19 ENCOUNTER — Telehealth: Payer: Self-pay | Admitting: Internal Medicine

## 2017-03-19 DIAGNOSIS — R0602 Shortness of breath: Secondary | ICD-10-CM | POA: Diagnosis not present

## 2017-03-19 DIAGNOSIS — J9611 Chronic respiratory failure with hypoxia: Secondary | ICD-10-CM

## 2017-03-19 DIAGNOSIS — J9612 Chronic respiratory failure with hypercapnia: Secondary | ICD-10-CM

## 2017-03-19 DIAGNOSIS — R5382 Chronic fatigue, unspecified: Secondary | ICD-10-CM | POA: Diagnosis not present

## 2017-03-19 DIAGNOSIS — J449 Chronic obstructive pulmonary disease, unspecified: Secondary | ICD-10-CM

## 2017-03-19 MED ORDER — FUROSEMIDE 20 MG PO TABS: 60.0000 mg | ORAL_TABLET | Freq: Two times a day (BID) | ORAL | 1 refills | Status: DC

## 2017-03-19 NOTE — Telephone Encounter (Signed)
Per MR, change pt's lasix to 60mg  bid and have pt come in 2 days to have BMET drawn.  Also, if there are concerns with the increased edema and increased SOB, pt might need to go to the ER to be further evaluated.  Called Enid Derry and stated this information to her about the change in lasix and needing to have BMET done in 2 days.  I also stated to shirley that pt should possibly go to the ER for further evaluation and also stated to her that pt did cancel her OV that was scheduled tomorrow with MW.  Enid Derry expressed understanding and stated she would call pt to discuss this information with pt.  Nothing further needed at this current time.

## 2017-03-19 NOTE — Telephone Encounter (Signed)
She is on trelegy So the albuterol neb should be as needed. No need for duoneb But if they need to do duoneb then it has to be 4x/day scheduled + dc trelegy + add pulmicort neb or a steroid mdi  Dr. Brand Males, M.D., [facility].C.P Pulmonary and Critical Care Medicine Staff Physician, [place] Director - Interstitial Lung Disease  Program  Pulmonary [place] at [place], [place], [address]  Pager: [phone], If no answer or between  15:00h - 7:00h: call [phone] Telephone: [phone]

## 2017-03-19 NOTE — Telephone Encounter (Signed)
Any suggestions on the increased edema and increased SOB?  Pt was scheduled to come in tomorrow but is unable to make that appt due to transportation issues and based on phone call I had with Enid Derry regarding pt, she seemed a little concern with the increased edema and SOB.

## 2017-03-19 NOTE — Telephone Encounter (Signed)
Received a phone call from [name], palliative care nurse who was at pt's house doing a visit with pt.  Enid Derry stated when examing pt, she noticed increased edema with both legs and pt also had increased SOB.  Pt's O2 sats were also in the low 90s at 92% even with pt wearing her O2.  Enid Derry stated that pt has missed a couple doses of her lasix but times before when she missed some doses, pt never had increased edema.  Pt was scheduled an acute visit tomorrow, 03/20/17 with MW at 3:15 but after speaking with Enid Derry and pt, pt is unable to come for that visit.  Enid Derry stated she was looking at pt's med list and pt only has albuterol neb solution ordered and was wondering if we could possibly also do duoneb along with the albuterol.  Dr. [name], based on all the above stated regarding pt, can you please advise on this and recommendations you have regarding pt. Thanks!

## 2017-03-20 ENCOUNTER — Other Ambulatory Visit: Payer: Self-pay | Admitting: Internal Medicine

## 2017-03-20 ENCOUNTER — Ambulatory Visit: Payer: Medicare Other | Admitting: Internal Medicine

## 2017-03-20 IMAGING — CR DG CHEST 1V PORT
1 series · 1 of 1 positions shown · non-contrast
Comparison: Radiograph 01/08/2016, CT 08/09/2015

CLINICAL DATA: Shortness of breath tonight.

EXAM:
PORTABLE CHEST 1 VIEW

[AP]
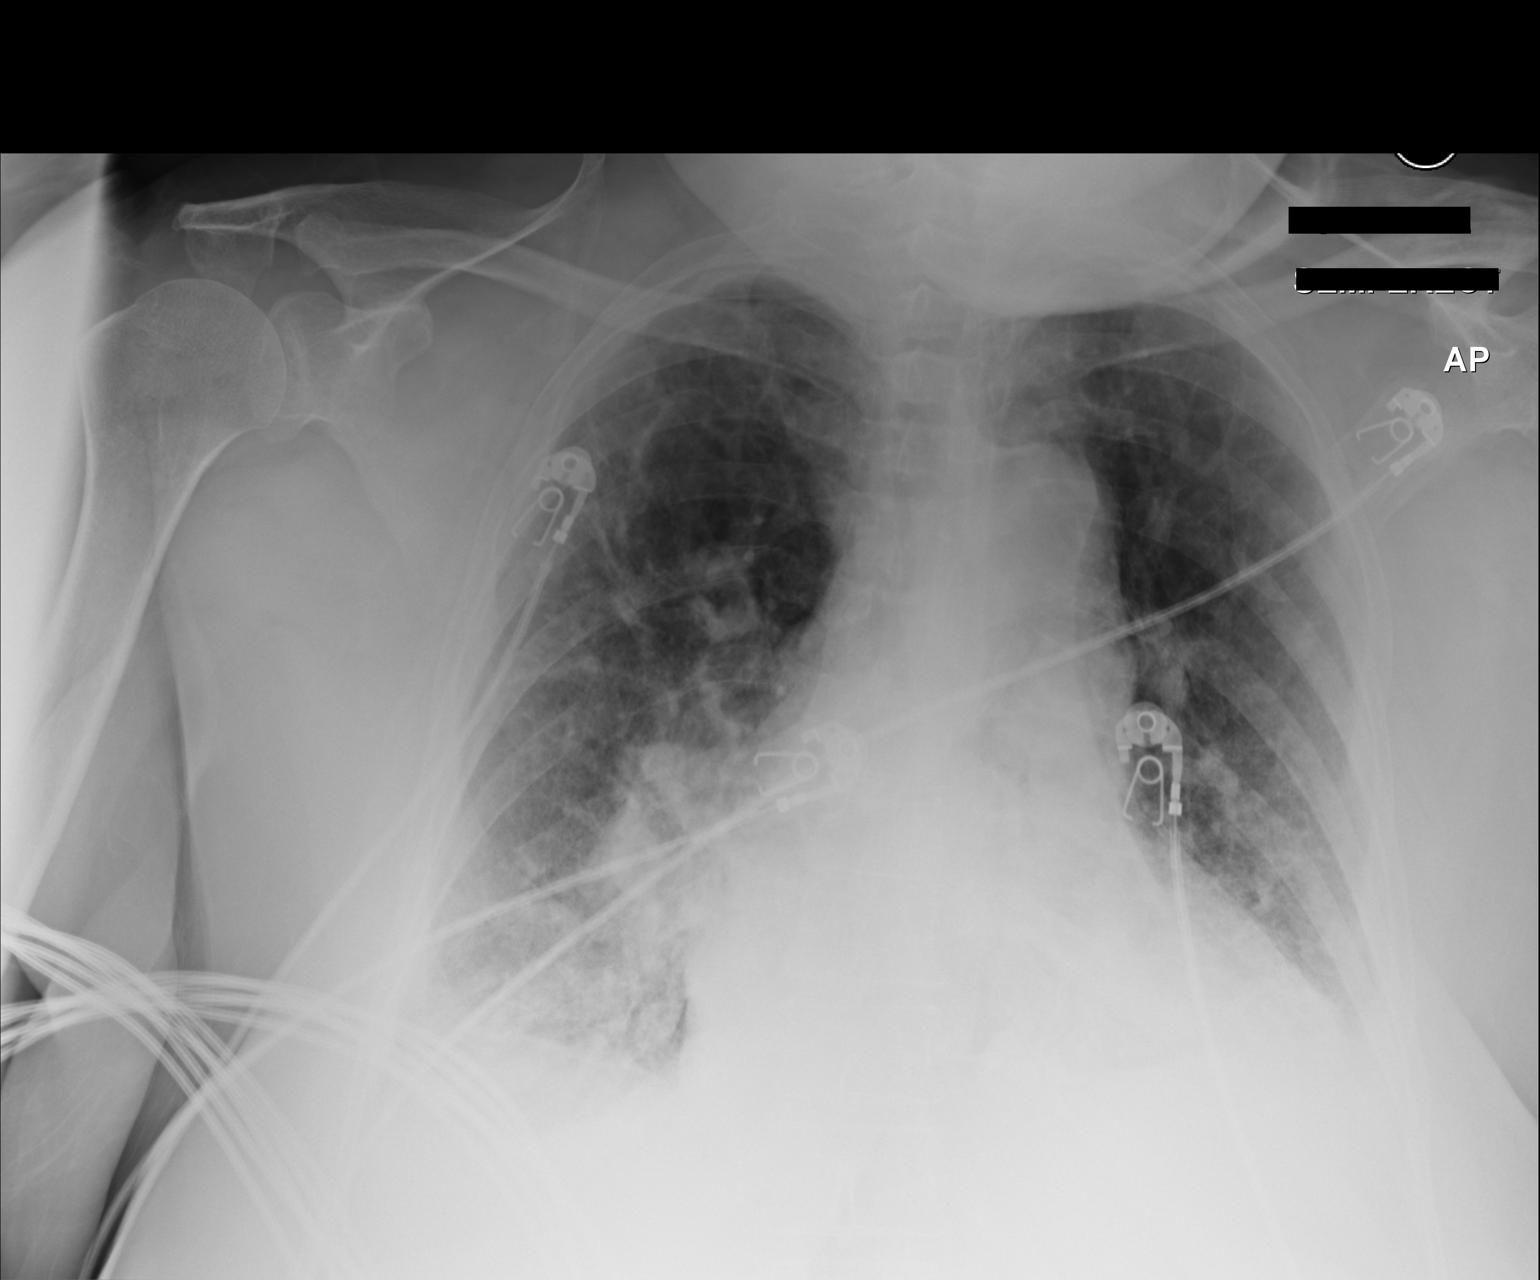

[1 of 1 positions shown; findings below may reference images not displayed]

FINDINGS: The heart is enlarged, stable cardiomegaly. Worsening peribronchial
thickening and interstitial opacities primarily at the bases.
Emphysema with hyperinflation. Difficult to exclude left pleural
effusion. No pneumothorax.
IMPRESSION: Cardiomegaly. Worsening peribronchial thickening and interstitial
opacities, may be pulmonary edema or atypical infection. Difficult
to exclude left pleural effusion.

## 2017-03-24 ENCOUNTER — Telehealth: Payer: Self-pay | Admitting: Internal Medicine

## 2017-03-24 NOTE — Telephone Encounter (Signed)
Per phone note on 03/19/17, pt was told to come in to have lab drawn for bmet.  Pt states she has no energy to come in for labwork.  Pt states [employer] bus does not want her to ride on it due to pt being SOB and shaky.  Pt states she is beginning to get stronger but due to not having a ride from a friend that was going to bring her to get labwork done and due to [place] bus not wanting her to ride on it due to her having the walker and being shaky with SOB, pt stated she won't be able to come until mid week.  MR, please advise if you are fine for pt coming mid week to have BMET done.  Thanks!

## 2017-03-25 NOTE — Telephone Encounter (Signed)
Pt is calling back. Cb is [phone].

## 2017-03-25 NOTE — Telephone Encounter (Signed)
MR in the office today, will await his response.

## 2017-03-26 DIAGNOSIS — R0602 Shortness of breath: Secondary | ICD-10-CM | POA: Diagnosis not present

## 2017-03-26 NOTE — Telephone Encounter (Signed)
Patient is aware nothing further needed. 

## 2017-03-26 NOTE — Telephone Encounter (Signed)
MR please advise. Thanks! 

## 2017-03-26 NOTE — Telephone Encounter (Signed)
That is fine she can come next week for bmet

## 2017-04-03 ENCOUNTER — Other Ambulatory Visit: Payer: Self-pay | Admitting: *Deleted

## 2017-04-03 MED ORDER — FUROSEMIDE 20 MG PO TABS: 60.0000 mg | ORAL_TABLET | Freq: Two times a day (BID) | ORAL | 0 refills | Status: DC

## 2017-04-03 NOTE — Progress Notes (Signed)
Due to pt's dosage changing to pt taking 60mg  lasix bid, pt needed another script.  Script sent to pt's preferred pharmacy.

## 2017-04-17 ENCOUNTER — Telehealth: Payer: Self-pay | Admitting: Internal Medicine

## 2017-04-17 MED ORDER — ALPRAZOLAM 0.5 MG PO TABS: 0.5000 mg | ORAL_TABLET | Freq: Three times a day (TID) | ORAL | 0 refills | Status: DC | PRN

## 2017-04-17 NOTE — Telephone Encounter (Signed)
Done erx - 1 mo only  Needs ROV with Dr Quay Burow for further refills after this given the controlled substance nature of the medication,  OK to continue palliative care

## 2017-04-17 NOTE — Telephone Encounter (Signed)
MD is out of the office pls advise on msg../lmb 

## 2017-04-17 NOTE — Telephone Encounter (Signed)
Copied from [name] [id number]. Topic: Inquiry >> Apr 17, 2017  2:54 PM [name], NT wrote: Patient is calling and states she needs a refill on her Xanax but was informed that she needed an OV before hand. Patient states she has a palliative care nurse that comes out and sees her and states she is confused about if she is still supposed to come to the office please advise.

## 2017-04-18 NOTE — Telephone Encounter (Signed)
Called pt no answer LMOM w/MD response../lmb 

## 2017-05-19 ENCOUNTER — Other Ambulatory Visit: Payer: Self-pay | Admitting: Internal Medicine

## 2017-05-19 NOTE — Telephone Encounter (Signed)
Copied from [place] [id number]. Topic: Quick Communication - Rx Refill/Question >> May 19, 2017  4:34 PM Cleaster Corin, [place] wrote: Medication: ALPRAZolam Duanne Moron) 0.5 MG tablet [[id number]] PARoxetine (PAXIL) 20 MG tablet [[id number]]  Has the patient contacted their pharmacy? yes (Agent: If no, request that the patient contact the pharmacy for the refill.) Preferred Pharmacy (with phone number or street name): [id number] - [place], [place] [address] Phone: [phone] Fax: [phone]   Agent: Please be advised that RX refills may take up to 3 business days. We ask that you follow-up with your pharmacy.

## 2017-05-20 ENCOUNTER — Other Ambulatory Visit: Payer: Self-pay | Admitting: Internal Medicine

## 2017-05-20 MED ORDER — ALPRAZOLAM 0.5 MG PO TABS: 0.5000 mg | ORAL_TABLET | Freq: Three times a day (TID) | ORAL | 0 refills | Status: DC | PRN

## 2017-05-20 MED ORDER — PAROXETINE HCL 20 MG PO TABS: 20.0000 mg | ORAL_TABLET | Freq: Every day | ORAL | 0 refills | Status: DC

## 2017-05-20 NOTE — Telephone Encounter (Signed)
Rx refill request:   Xanax 0.5 mg   Filled 04/17/17  #90- must have appointment-     Next appointment 06/02/17                               Paxil 20 mg       Filled  12/12/16-?  # 30  LOV: 06/24/16   PCP: Quay Burow   Pharmacy: verified

## 2017-05-20 NOTE — Telephone Encounter (Signed)
Check [place] registry last filled 04/17/2017.Marland KitchenJohny Chess

## 2017-05-21 ENCOUNTER — Telehealth: Payer: Self-pay | Admitting: Emergency Medicine

## 2017-05-21 NOTE — Telephone Encounter (Signed)
Called pt to schedule AWV. Pt declined at this time.

## 2017-05-26 ENCOUNTER — Telehealth: Payer: Self-pay | Admitting: Internal Medicine

## 2017-05-26 NOTE — Telephone Encounter (Signed)
lmtcb x1 for pt. 

## 2017-05-27 MED ORDER — CEPHALEXIN 500 MG PO CAPS: 500.0000 mg | ORAL_CAPSULE | Freq: Three times a day (TID) | ORAL | 0 refills | Status: DC

## 2017-05-27 MED ORDER — PREDNISONE 10 MG PO TABS: ORAL_TABLET | ORAL | 0 refills | Status: DC

## 2017-05-27 NOTE — Telephone Encounter (Signed)
Spoke with pt. States that she is not feeling well. Reports DOE and increased fatigue. Denies chest tightness, wheezing, coughing or fever. States that her symptoms started about a week ago. She would like Dr. Golden Pop recommendations.  Dr. [name] - please advise. Thanks.

## 2017-05-27 NOTE — Telephone Encounter (Signed)
Spoke with pt. She is aware of Dr. Ramaswamy's recommendations. Rxs have been sent in. Nothing further was needed. 

## 2017-05-27 NOTE — Telephone Encounter (Signed)
Possibly AECOPD  cephalexin 500mg  three times daily x  5 days   Take prednisone 40 mg daily x 2 days, then 20mg  daily x 2 days, then 10mg  daily x 2 days, then 5mg  daily x 2 days and stop and stop or got to baseline dose

## 2017-06-02 ENCOUNTER — Ambulatory Visit: Payer: Medicare Other | Admitting: Internal Medicine

## 2017-06-05 ENCOUNTER — Other Ambulatory Visit: Payer: Self-pay | Admitting: Internal Medicine

## 2017-06-09 ENCOUNTER — Other Ambulatory Visit: Payer: Medicare Other | Admitting: Licensed Clinical Social Worker

## 2017-06-09 DIAGNOSIS — Z515 Encounter for palliative care: Secondary | ICD-10-CM

## 2017-06-09 NOTE — Progress Notes (Signed)
COMMUNITY PALLIATIVE CARE SW NOTE  PATIENT NAME: [name] DOB: Sep 27, 1945 MRN: [id number]  PRIMARY CARE PROVIDER: Binnie Rail, MD  RESPONSIBLE PARTY:  Acct ID - Guarantor Home Phone Work Phone Relationship Acct Type  [id number] [facility] [phone]  Self P/F     [address], [place], [place] [address]     PLAN OF CARE and INTERVENTIONS:             1. GOALS OF CARE/ ADVANCE CARE PLANNING:  Patient stated she wishes to remain in her apartment.  She wants to avoid hospitalizations.  Patient remains a full code. 2. SOCIAL/EMOTIONAL/SPIRITUAL ASSESSMENT/ INTERVENTIONS:  Patient is a 72 y/o [place] American divorced female who lives alone.  She has three sons.  One is in the [employer] in [place], another in [place], and one in [place].  Her son spends the night occasionally.  She is from [place] and has lived in her apartment since 2010.  Palliative Care SW met with patient to assess coping and needs.  She was alert and oriented x4.  She expressed not having any church affiliation, but was raised [employer].  She said she would like SW to find a [employer] that has members that could visit her.  Patient denied pain during visit, but appeared to become short of breath as the conversation continued.  She said she takes breaks, uses a fan and pursed lip breathing to resume her normal breathing. 3. PATIENT/CAREGIVER EDUCATION/ COPING:  SW provided education regarding the Palliative Care program.  She stated she understood.  Patient provides her own care.  Patient copes by expressing her feelings openly.  She is proficient at getting her needs met.  4. PERSONAL EMERGENCY PLAN:  Patient stated that when she begins to feel exacerbation of her symptoms, she calls Dr. Quay Burow who will prescribe medication for her. 5. COMMUNITY RESOURCES COORDINATION/ HEALTH CARE NAVIGATION:  Patient has put several of her own resources in place.  [employer] Whe[employer] delivers a meal one time per  day. She also has aides on Tuesdays and Fridays for four hours.  She said they were through DSS.  [facility] calls her once a day to check in.  She is also part of an on-line support group called [facility]. 6. FINANCIAL/LEGAL CONCERNS/INTERVENTIONS:  Patient stated she received a co-pay bill for Palliative Care and wanted assistance resolving this issue.  SW to follow up with [name], NP.     SOCIAL HX:  Social History   Tobacco Use  . Smoking status: Current Every Day Smoker    Packs/day: 0.50    Years: 44.00    Pack years: 22.00    Types: Cigarettes  . Smokeless tobacco: Never Used  . Tobacco comment: 2 cigs per day as of 12/23/16 ep  Substance Use Topics  . Alcohol use: Yes    Alcohol/week: 0.0 oz    Comment: occasional    CODE STATUS:  Full code  ADVANCED DIRECTIVES: No MOST FORM COMPLETE:  SW did not introduce. HOSPICE EDUCATION PROVIDED:  Yes  PPS:  Patients intake is normal.  She is able to stand independently and walk short distances.  She uses her walker also. Duration of visit and documentation:  90 minutes.      Creola Corn Sandi Towe, LCSW

## 2017-06-13 ENCOUNTER — Ambulatory Visit: Payer: Medicare Other | Admitting: Internal Medicine

## 2017-06-15 ENCOUNTER — Other Ambulatory Visit: Payer: Self-pay | Admitting: Internal Medicine

## 2017-06-18 NOTE — Progress Notes (Signed)
Subjective:    Patient ID: [name], female    DOB: 1945-07-25, 72 y.o.   MRN: [id number]  HPI .  Patient Active Problem List   Diagnosis Date Noted  . Depression 06/24/2016  . History of bacterial pneumonia 06/06/2016  . Pneumonia of both lungs due to infectious organism   . Chronic respiratory failure with hypoxia ([place])   . Pulmonary hypertension ([place])   . COPD with acute exacerbation ([place]) 02/22/2016  . Physical deconditioning 11/15/2015  . GERD (gastroesophageal reflux disease) 11/01/2015  . 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 Diabetes ([place]) 01/23/2015  . Fatigue 01/20/2015  . Chronic obstructive pulmonary disease ([place]) 10/18/2014  . Generalized anxiety disorder 03/15/2013  . Obesity, unspecified 03/15/2013  . Chronic diastolic heart failure ([place]) 03/14/2013  . Chronic respiratory failure ([place]) 06/30/2012  . COPD (chronic obstructive pulmonary disease) ([place])   . Tobacco abuse   . Pulmonary nodule 03/22/2010  . 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 Bronchiectasis 03/22/2010  . Hyperlipidemia 06/13/2008  . Essential hypertension 03/06/2007  . ADJUSTMENT DISORDER WITH ANXIETY 02/20/2007  . MITRAL VALVE PROLAPSE 02/20/2007  . CERVICAL CANCER, HX OF 02/20/2007    Current Outpatient Medications on File Prior to Visit  Medication Sig Dispense Refill  . albuterol (PROVENTIL HFA;VENTOLIN HFA) 108 (90 Base) MCG/ACT inhaler Inhale 2 puffs into the lungs every 6 (six) hours as needed for wheezing or shortness of breath. 1 Inhaler 5  . albuterol (PROVENTIL) (2.5 MG/3ML) 0.083% nebulizer solution USE ONE VIAL IN NEBULIZER EVERY 6 HOURS AS NEEDED FOR WHEEZING 75 mL 11  . ALPRAZolam (XANAX) 0.5 MG tablet Take 1 tablet (0.5 mg total) by mouth 3 (three) times daily as needed for anxiety. -- Office visit needed for further refills 90 tablet 0  . cephALEXin (KEFLEX) 500 MG capsule Take 1 capsule (500 mg total) by mouth 3 (three) times daily. 15 capsule 0  . doxycycline (VIBRA-TABS)  100 MG tablet Take 1 tablet (100 mg total) by mouth 2 (two) times daily. 10 tablet 0  . doxycycline (VIBRA-TABS) 100 MG tablet Take 2 today, then 1 daily until gone. 8 tablet 0  . fluticasone (FLONASE) 50 MCG/ACT nasal spray USE 2 SPRAYS IN EACH NOSTRIL EVERY DAY AS NEEDED FOR ALLERGIES AND RHINITIS 48 g 1  . furosemide (LASIX) 20 MG tablet Take 3 tablets (60 mg total) by mouth 2 (two) times daily. 270 tablet 0  . GLUCOCOM LANCETS 33G MISC Use with Test Strips to take blood sugars 100 each 5  . glucose blood (BAYER CONTOUR NEXT TEST) test strip Use to take blood sugars twice daily. 100 each 3  . Insulin Glargine (LANTUS SOLOSTAR) 100 UNIT/ML Solostar Pen Inject 22 Units into the skin daily at 10 pm. 5 pen 11  . Insulin Glargine (LANTUS SOLOSTAR) 100 UNIT/ML Solostar Pen Inject 32 Units into the skin daily at 10 pm. -- Office visit needed for further refills 5 pen 0  . Insulin Pen Needle (BD PEN NEEDLE NANO U/F) 32G X 4 MM MISC USE TO ADMINISTER LANTUS INSULIN 100 each 3  . pantoprazole (PROTONIX) 40 MG tablet Take 1 tablet (40 mg total) by mouth daily. 30 tablet 0  . PARoxetine (PAXIL) 20 MG tablet Take 1 tablet (20 mg total) by mouth daily. 30 tablet 0  . predniSONE (DELTASONE) 10 MG tablet Take 40mg  x2 days, 20mg  x2 days,10mg  x2 days, 5mg  x2 days, then stop or go back to baseline dose 15 tablet 0  . predniSONE (DELTASONE) 20 MG  tablet Take 1 tablet (20 mg total) by mouth daily with breakfast. 90 tablet 1  . TRELEGY ELLIPTA 100-62.5-25 MCG/INH AEPB TAKE 1 PUFF BY MOUTH EVERY DAY 60 each 4   No current facility-administered medications on file prior to visit.     Past Medical History:  Diagnosis Date  . Arrhythmia   . Bronchiectasis march 2012   On CT chest. RUL. Mild  . CHF (congestive heart failure) ([place])   . Chicken pox   . Chronic diastolic heart failure ([place])   . Chronic respiratory failure ([place])    Followed in Pulmonary clinic/ [facility]/ Ramaswamy  - 06/30/2012 desat to 86%   RA walking 50 ft, recovered to 90% at rest - 06/30/2012  Walked 1lpm x 3 laps @ 185 ft each stopped due to  Sob, no desat  rec 02 2lpm with activity and sleeping, ok at rest   . COPD (chronic obstructive pulmonary disease) ([place])     FEV-1 in 2008 was 63% with a diffusion capacity of 33%.   . Generalized anxiety disorder   . History of cervical cancer 1982  . Hyperlipidemia   . Hypertension   . Leg swelling    Venous doppler right 07/21/12 >>Neg    . 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 Medical non-compliance   . MITRAL VALVE PROLAPSE   . Obesity, unspecified   . Pulmonary nodule march 2012   63mm RUL and RLL 1st seen march 2012 CT, ?progression 12/2012 CT  . Seasonal allergies   . Tobacco abuse     Smokes one pack a day since age 6.  . Tobacco abuse     Past Surgical History:  Procedure Laterality Date  . TOTAL ABDOMINAL HYSTERECTOMY      Social History   Socioeconomic History  . Marital status: Divorced    Spouse name: Not on file  . Number of children: Not on file  . Years of education: Not on file  . Highest education level: Not on file  Occupational History  . Occupation: works at a call center  Social Needs  . Financial resource strain: Not on file  . Food insecurity:    Worry: Not on file    Inability: Not on file  . Transportation needs:    Medical: Not on file    Non-medical: Not on file  Tobacco Use  . Smoking status: Current Every Day Smoker    Packs/day: 0.50    Years: 44.00    Pack years: 22.00    Types: Cigarettes  . Smokeless tobacco: Never Used  . Tobacco comment: 2 cigs per day as of 12/23/16 ep  Substance and Sexual Activity  . Alcohol use: Yes    Alcohol/week: 0.0 oz    Comment: occasional  . Drug use: No  . Sexual activity: Not on file  Lifestyle  . Physical activity:    Days per week: Not on file    Minutes per session: Not on file  . Stress: Not on file  Relationships  . Social connections:     Talks on phone: Not on file    Gets together: Not on file    Attends religious service: Not on file    Active member of club or organization: Not on file    Attends meetings of clubs or organizations: Not on file    Relationship status: Not on file  Other Topics Concern  . Not on file  Social History Narrative   Married but recently separated from husband    Family History  Problem Relation Age of Onset  . Other Father        MVA  . Esophageal cancer Mother     Review of Systems     Objective:  There were no vitals filed for this visit. BP Readings from Last 3 Encounters:  12/23/16 112/70  06/24/16 118/68  06/19/16 108/64   Wt Readings from Last 3 Encounters:  12/23/16 228 lb 9.6 oz (103.7 kg)  06/19/16 226 lb (102.5 kg)  06/06/16 228 lb (103.4 kg)   There is no height or weight on file to calculate BMI.   Physical Exam         Assessment & Plan:    See Problem List for Assessment and Plan of chronic medical problems.   This encounter was created in error - please disregard.

## 2017-06-19 ENCOUNTER — Other Ambulatory Visit: Payer: Self-pay | Admitting: Internal Medicine

## 2017-06-19 ENCOUNTER — Encounter: Payer: Medicare Other | Admitting: Internal Medicine

## 2017-06-19 ENCOUNTER — Telehealth: Payer: Self-pay | Admitting: Emergency Medicine

## 2017-06-19 NOTE — Telephone Encounter (Signed)
Copied from [place] [id number]. Topic: Quick Communication - See Telephone Encounter >> Jun 19, 2017  9:52 AM Antonieta Iba C wrote: CRM for notification. See Telephone encounter for: 06/19/17.   Pt says that provider have her set up with palliative care and a nurse has been coming out to the office. Pt would like to know if she should still be coming in to see PCP for medications or see Palliative care?   NT:[phone]

## 2017-06-19 NOTE — Telephone Encounter (Signed)
Yes she still needs to see me

## 2017-06-19 NOTE — Telephone Encounter (Signed)
LVM informing pt that MD will still need to see her while on Palliative care. Pt will need to reschedule appt from today.

## 2017-06-24 NOTE — Progress Notes (Deleted)
Subjective:    Patient ID: [name], female    DOB: Jul 01, 1945, 72 y.o.   MRN: [id number]  HPI The patient is here for follow up.  Diabetes: She is taking her medication daily as prescribed. She is compliant with a diabetic diet. She is exercising regularly. She monitors her sugars and they have been running XXX. She checks her feet daily and denies foot lesions. She is up-to-date with an ophthalmology examination.   Chronic diastolic dysfunction, Hypertension: She is taking her medication daily. She is compliant with a low sodium diet.  She denies chest pain, palpitations, edema, shortness of breath and regular headaches. She is exercising regularly.  She does not monitor her blood pressure at home.    COPD, chronic resp failure with hypoxia:  She is on continuous oxygen and daily steroids.  She follows with pulmonary.  Anxiety: She is taking her medication daily as prescribed. She denies any side effects from the medication. She feels her anxiety is well controlled and she is happy with her current dose of medication.   Depression: She is taking her medication daily as prescribed. She denies any side effects from the medication. She feels her depression is well controlled and she is happy with her current dose of medication.   GERD:  She is taking her medication daily as prescribed.  She denies any GERD symptoms and feels her GERD is well controlled.    Medications and allergies reviewed with patient and updated if appropriate.  Patient Active Problem List   Diagnosis Date Noted  . Depression 06/24/2016  . History of bacterial pneumonia 06/06/2016  . Pneumonia of both lungs due to infectious organism   . Chronic respiratory failure with hypoxia ([place])   . Pulmonary hypertension ([place])   . Physical deconditioning 11/15/2015  . GERD (gastroesophageal reflux disease) 11/01/2015  . 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 Diabetes ([place]) 01/23/2015  . Fatigue 01/20/2015  . Chronic  obstructive pulmonary disease ([place]) 10/18/2014  . Generalized anxiety disorder 03/15/2013  . Obesity, unspecified 03/15/2013  . Chronic diastolic heart failure ([place]) 03/14/2013  . Chronic respiratory failure ([place]) 06/30/2012  . COPD (chronic obstructive pulmonary disease) ([place])   . Tobacco abuse   . Pulmonary nodule 03/22/2010  . 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 Bronchiectasis 03/22/2010  . Hyperlipidemia 06/13/2008  . Essential hypertension 03/06/2007  . ADJUSTMENT DISORDER WITH ANXIETY 02/20/2007  . MITRAL VALVE PROLAPSE 02/20/2007  . CERVICAL CANCER, HX OF 02/20/2007    Current Outpatient Medications on File Prior to Visit  Medication Sig Dispense Refill  . albuterol (PROVENTIL HFA;VENTOLIN HFA) 108 (90 Base) MCG/ACT inhaler Inhale 2 puffs into the lungs every 6 (six) hours as needed for wheezing or shortness of breath. 1 Inhaler 5  . albuterol (PROVENTIL) (2.5 MG/3ML) 0.083% nebulizer solution USE ONE VIAL IN NEBULIZER EVERY 6 HOURS AS NEEDED FOR WHEEZING 75 mL 11  . ALPRAZolam (XANAX) 0.5 MG tablet Take 1 tablet (0.5 mg total) by mouth 3 (three) times daily as needed for anxiety. -- Office visit needed for further refills 90 tablet 0  . fluticasone (FLONASE) 50 MCG/ACT nasal spray USE 2 SPRAYS IN EACH NOSTRIL EVERY DAY AS NEEDED FOR ALLERGIES AND RHINITIS 48 g 1  . furosemide (LASIX) 20 MG tablet Take 3 tablets (60 mg total) by mouth 2 (two) times daily. 270 tablet 0  . GLUCOCOM LANCETS 33G MISC Use with Test Strips to take blood sugars 100 each 5  . glucose blood (BAYER CONTOUR NEXT TEST)  test strip Use to take blood sugars twice daily. 100 each 3  . Insulin Glargine (LANTUS SOLOSTAR) 100 UNIT/ML Solostar Pen Inject 22 Units into the skin daily at 10 pm. 5 pen 11  . Insulin Glargine (LANTUS SOLOSTAR) 100 UNIT/ML Solostar Pen Inject 32 Units into the skin daily at 10 pm. -- Office visit needed for further refills 5 pen 0  . Insulin Pen Needle (BD PEN NEEDLE NANO U/F) 32G X 4 MM MISC  USE TO ADMINISTER LANTUS INSULIN 100 each 3  . pantoprazole (PROTONIX) 40 MG tablet Take 1 tablet (40 mg total) by mouth daily. 30 tablet 0  . PARoxetine (PAXIL) 20 MG tablet Take 1 tablet (20 mg total) by mouth daily. 30 tablet 0  . predniSONE (DELTASONE) 10 MG tablet Take 40mg  x2 days, 20mg  x2 days,10mg  x2 days, 5mg  x2 days, then stop or go back to baseline dose 15 tablet 0  . predniSONE (DELTASONE) 20 MG tablet Take 1 tablet (20 mg total) by mouth daily with breakfast. 90 tablet 1  . TRELEGY ELLIPTA 100-62.5-25 MCG/INH AEPB TAKE 1 PUFF BY MOUTH EVERY DAY 60 each 4   No current facility-administered medications on file prior to visit.     Past Medical History:  Diagnosis Date  . Arrhythmia   . Bronchiectasis march 2012   On CT chest. RUL. Mild  . CHF (congestive heart failure) ([place])   . Chicken pox   . Chronic diastolic heart failure ([place])   . Chronic respiratory failure ([place])    Followed in Pulmonary clinic/ [facility]/ Ramaswamy  - 06/30/2012 desat to 86%  RA walking 50 ft, recovered to 90% at rest - 06/30/2012  Walked 1lpm x 3 laps @ 185 ft each stopped due to  Sob, no desat  rec 02 2lpm with activity and sleeping, ok at rest   . COPD (chronic obstructive pulmonary disease) ([place])     FEV-1 in 2008 was 63% with a diffusion capacity of 33%.   . Generalized anxiety disorder   . History of cervical cancer 1982  . Hyperlipidemia   . Hypertension   . Leg swelling    Venous doppler right 07/21/12 >>Neg    . 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 Medical non-compliance   . MITRAL VALVE PROLAPSE   . Obesity, unspecified   . Pulmonary nodule march 2012   34mm RUL and RLL 1st seen march 2012 CT, ?progression 12/2012 CT  . Seasonal allergies   . Tobacco abuse     Smokes one pack a day since age 18.  . Tobacco abuse     Past Surgical History:  Procedure Laterality Date  . TOTAL ABDOMINAL HYSTERECTOMY      Social History   Socioeconomic  History  . Marital status: Divorced    Spouse name: Not on file  . Number of children: Not on file  . Years of education: Not on file  . Highest education level: Not on file  Occupational History  . Occupation: works at a call center  Social Needs  . Financial resource strain: Not on file  . Food insecurity:    Worry: Not on file    Inability: Not on file  . Transportation needs:    Medical: Not on file    Non-medical: Not on file  Tobacco Use  . Smoking status: Current Every Day Smoker    Packs/day: 0.50    Years: 44.00  Pack years: 22.00    Types: Cigarettes  . Smokeless tobacco: Never Used  . Tobacco comment: 2 cigs per day as of 12/23/16 ep  Substance and Sexual Activity  . Alcohol use: Yes    Alcohol/week: 0.0 oz    Comment: occasional  . Drug use: No  . Sexual activity: Not on file  Lifestyle  . Physical activity:    Days per week: Not on file    Minutes per session: Not on file  . Stress: Not on file  Relationships  . Social connections:    Talks on phone: Not on file    Gets together: Not on file    Attends religious service: Not on file    Active member of club or organization: Not on file    Attends meetings of clubs or organizations: Not on file    Relationship status: Not on file  Other Topics Concern  . Not on file  Social History Narrative   Married but recently separated from husband    Family History  Problem Relation Age of Onset  . Other Father        MVA  . Esophageal cancer Mother     Review of Systems     Objective:  There were no vitals filed for this visit. BP Readings from Last 3 Encounters:  12/23/16 112/70  06/24/16 118/68  06/19/16 108/64   Wt Readings from Last 3 Encounters:  12/23/16 228 lb 9.6 oz (103.7 kg)  06/19/16 226 lb (102.5 kg)  06/06/16 228 lb (103.4 kg)   There is no height or weight on file to calculate BMI.   Physical Exam    Constitutional: Appears well-developed and well-nourished. No distress.    HENT:  Head: Normocephalic and atraumatic.  Neck: Neck supple. No tracheal deviation present. No thyromegaly present.  No cervical lymphadenopathy Cardiovascular: Normal rate, regular rhythm and normal heart sounds.   No murmur heard. No carotid bruit .  No edema Pulmonary/Chest: Effort normal and breath sounds normal. No respiratory distress. No has no wheezes. No rales.  Skin: Skin is warm and dry. Not diaphoretic.  Psychiatric: Normal mood and affect. Behavior is normal.      Assessment & Plan:    See Problem List for Assessment and Plan of chronic medical problems.

## 2017-06-25 ENCOUNTER — Telehealth: Payer: Self-pay | Admitting: Internal Medicine

## 2017-06-25 ENCOUNTER — Ambulatory Visit: Payer: Self-pay | Admitting: *Deleted

## 2017-06-25 ENCOUNTER — Ambulatory Visit: Payer: Medicare Other | Admitting: Internal Medicine

## 2017-06-25 ENCOUNTER — Other Ambulatory Visit: Payer: Medicare Other | Admitting: Licensed Clinical Social Worker

## 2017-06-25 ENCOUNTER — Other Ambulatory Visit: Payer: Medicare Other | Admitting: *Deleted

## 2017-06-25 DIAGNOSIS — Z515 Encounter for palliative care: Secondary | ICD-10-CM

## 2017-06-25 MED ORDER — CEPHALEXIN 500 MG PO CAPS: 500.0000 mg | ORAL_CAPSULE | Freq: Three times a day (TID) | ORAL | 0 refills | Status: DC

## 2017-06-25 MED ORDER — PREDNISONE 10 MG PO TABS: ORAL_TABLET | ORAL | 0 refills | Status: DC

## 2017-06-25 MED ORDER — DOXYCYCLINE HYCLATE 100 MG PO TABS: 100.0000 mg | ORAL_TABLET | Freq: Two times a day (BID) | ORAL | 0 refills | Status: DC

## 2017-06-25 NOTE — Telephone Encounter (Signed)
Patient did not come to appt.    She has contacted pulmonary as well -they did increase her steroids and looks they will be send in an antibiotic - may not get to until later today -- will send in keflex - she can take this.    Needs to go to ED if symptoms worsen.

## 2017-06-25 NOTE — Telephone Encounter (Signed)
Patient is returning call. CB is [phone]

## 2017-06-25 NOTE — Telephone Encounter (Signed)
Patient has an appt today at 76 Pm with her PCP -  Patient takes  02 at home She reports she has a medical bus  Transportation arranged to pick her up.She reports a home health nurse is scheduled to come at 1300 to evaulate. Pt advised to call 911 if symptoms worse  Reason for Disposition . [1] MILD difficulty breathing (e.g., minimal/no SOB at rest, SOB with walking, pulse <100) AND [2] NEW-onset or WORSE than normal  Answer Assessment - Initial Assessment Questions 1. RESPIRATORY STATUS: "Describe your breathing?" (e.g., wheezing, shortness of breath, unable to speak, severe coughing)       Shortness of breath gets weak gets  short on exertion   2. ONSET: "When did this breathing problem begin?"        2  Days  Ago  3. PATTERN "Does the difficult breathing come and go, or has it been constant since it started?"          Comes and  Goes  4. SEVERITY: "How bad is your breathing?" (e.g., mild, moderate, severe)    - MILD: No SOB at rest, mild SOB with walking, speaks normally in sentences, can lay down, no retractions, pulse < 100.    - MODERATE: SOB at rest, SOB with minimal exertion and prefers to sit, cannot lie down flat, speaks in phrases, mild retractions, audible wheezing, pulse 100-120.    - SEVERE: Very SOB at rest, speaks in single words, struggling to breathe, sitting hunched forward, retractions, pulse > 120         Mild- moderate   5. RECURRENT SYMPTOM: "Have you had difficulty breathing before?" If so, ask: "When was the last time?" and "What happened that time?"        Copd   6. CARDIAC HISTORY: "Do you have any history of heart disease?" (e.g., heart attack, angina, bypass surgery, angioplasty)          no 7. LUNG HISTORY: "Do you have any history of lung disease?"  (e.g., pulmonary embolus, asthma, emphysema)     Copd    8. CAUSE: "What do you think is causing the breathing problem?"        Weather  9. OTHER SYMPTOMS: "Do you have any other symptoms? (e.g., dizziness, runny  nose, cough, chest pain, fever)      No   10. PREGNANCY: "Is there any chance you are pregnant?" "When was your last menstrual period?"       n/a 11. TRAVEL: "Have you traveled out of the country in the last month?" (e.g., travel history, exposures)         no  Protocols used: BREATHING DIFFICULTY-A-AH

## 2017-06-25 NOTE — Telephone Encounter (Signed)
Recommend  Please take Take prednisone 40mg  once daily x 3 days, then 30mg  once daily x 3 days, then 20mg  once daily to continue   Also  Take doxycycline 100mg  po twice daily x 5 days; take after meals and avoid sunlight

## 2017-06-25 NOTE — Addendum Note (Signed)
Addended by: Binnie Rail on: 06/25/2017 04:47 PM   Modules accepted: Orders

## 2017-06-25 NOTE — Telephone Encounter (Signed)
Spoke with pt to inform.  

## 2017-06-25 NOTE — Telephone Encounter (Signed)
lmtcb for pt.  

## 2017-06-25 NOTE — Progress Notes (Signed)
COMMUNITY PALLIATIVE CARE SW NOTE  PATIENT NAME: [name] DOB: 1945/10/27 MRN: [id number]  PRIMARY CARE PROVIDER: Binnie Rail, MD  RESPONSIBLE PARTY:  Acct ID - Guarantor Home Phone Work Phone Relationship Acct Type  [id number] [facility] [phone]  Self P/F     [address], [place], [place] [address]     PLAN OF CARE and INTERVENTIONS:             1. GOALS OF CARE/ ADVANCE CARE PLANNING:  Patient wishes to remain in her apartment.  She is a full code. 2. SOCIAL/EMOTIONAL/SPIRITUAL ASSESSMENT/ INTERVENTIONS:  SW and Palliative Care RN, Daryl Eastern, met with patient.  Her breathing appeared labored initially, but slowed eventually.  Discussed her relationship with her adult son.  SW provided active listening and supportive counseling. 3. PATIENT/CAREGIVER EDUCATION/ COPING:  Patient copes by expressing her feelings openly.  She is alert and oriented x3.  SW and RN provided education regarding roles.  She stated she understood. 4. PERSONAL EMERGENCY PLAN:  Patient reported having a COPD episode prior to visit and called her MD. 5. COMMUNITY RESOURCES COORDINATION/ HEALTH CARE NAVIGATION:  Patient's aides are now providing care three days per week.  She also receives Meals on Wheels.  She utilizes Development worker, community and is part of an on-line support group. 6. FINANCIAL/LEGAL CONCERNS/INTERVENTIONS:  None per patient.  Patient stated she has resolved the Palliative Care co-pay bill she received.     SOCIAL HX:  Social History   Tobacco Use  . Smoking status: Current Every Day Smoker    Packs/day: 0.50    Years: 44.00    Pack years: 22.00    Types: Cigarettes  . Smokeless tobacco: Never Used  . Tobacco comment: 2 cigs per day as of 12/23/16 ep  Substance Use Topics  . Alcohol use: Yes    Alcohol/week: 0.0 oz    Comment: occasional    CODE STATUS: Full Code  ADVANCED DIRECTIVES: N MOST FORM COMPLETE: N HOSPICE EDUCATION PROVIDED: Not during current visit.  PPS:   Patient's intake is normal.  She is able to stand and ambulate independently with her walker. Duration of visit and documentation:  90 minutes.      Creola Corn Brooklen Runquist, LCSW

## 2017-06-25 NOTE — Telephone Encounter (Signed)
Spoke with pt, c/o increased sob, fatigue, nonprod cough X2 days.  States she feels that she is having a COPD exacerbation. Denies fever, chest pain, sinus congestion, mucus production.   Pt increased her prednisone from maintenance 20mg  daily to 30mg  today.  Requesting an abx.  [facility]: [facility].  MR please advise.  Thanks!

## 2017-06-25 NOTE — Telephone Encounter (Signed)
   Per MR- Prednisone 40mg  once daily x 3 days, then 30mg  once daily x 3 days, then 20mg  once daily to continue and doxycycline 100mg  po twice daily x 5 days; take after meals and avoid sunlight prescriptions ordered and sent to Patient preferred [facility] and notified Patient. Patient stated understanding.  Nothing further needed at this time.

## 2017-06-25 NOTE — Progress Notes (Signed)
COMMUNITY PALLIATIVE CARE RN NOTE  PATIENT NAME: [name] DOB: [birth year] MRN: [id number]  PRIMARY CARE PROVIDER: Binnie Rail, MD  RESPONSIBLE PARTY:  Acct ID - Guarantor Home Phone Work Phone Relationship Acct Type  [id number] - [place] [phone]  Self P/F     [address], [place], [place] [address]    PLAN OF CARE and INTERVENTION:  1. ADVANCE CARE PLANNING/GOALS OF CARE: Avoid hospitalizations and continues to wants full scope of treatment 2. PATIENT/CAREGIVER EDUCATION: Explained palliative services, Reinforced Safety/Fall Precautions and importance of energy conservation 3. DISEASE STATUS: Joint visit made with [place]. Patient sitting up on her walker upon this RNs arrival. Patient experiencing increased dyspnea at rest with use of chest accessory muscles. Patient was supposed to go to an appointment today to see her PCP Dr. Quay Burow but did not feel that she could make it on the [facility] bus d/t dyspnea, so she cancelled. Reports that she has been more short of breath and weaker over the past 2 days. Patient placed call to her Pulmonologist and spoke with his nurse during visit, who states that she will call patient back after speaking with the MD. Patient states that they will usually order her antibiotics to help her through her exacerbations. Minimal air movement heard throughout all lung fields. No crackles or congestion noted. Cough nonproductive. Patient's breathing eased throughout visit. Had fan blowing to help as well. Reports that she tires very easily and requires frequent rest periods. States that she tries to cook, but it takes her about 2 hours to cook 3 items due to fatigue. She has been approved to have hired caregivers 12 hours per week, so an aide comes 3x/week for 4 hours each day to assist with cooking, cleaning and help patient into and out of the bath tub. Ambulates using her walker. No recent falls. Intake is variable. Some days patient  states she eats often throughout the day and other days, like today, is not interested in eating much. Denies any pain at this time.    HISTORY OF PRESENT ILLNESS:  This is a 72 yo female who continues to be followed by Palliative Care to assist with goals of care and symptom management needs. Next visit scheduled in 1 month.  CODE STATUS: FULL CODE  ADVANCED DIRECTIVES: N MOST FORM: yes PPS: 40%   PHYSICAL EXAM:   VITALS: Today's Vitals   06/25/17 1316  BP: 110/62  Pulse: 87  Resp: 20  SpO2: (!) 89%  PainSc: 0-No pain    LUNGS: decreased breath sounds CARDIAC: Cor RRR EXTREMITIES: No edema SKIN: Exposed skin dry and intact  NEURO: Alert and oriented x 3, engaging in conversation, increased generalized weakness   (Duration of visit and documentation 90 minutes)    Daryl Eastern, RN, BSN

## 2017-06-25 NOTE — Telephone Encounter (Signed)
Pt is calling back [phone]

## 2017-06-30 NOTE — Progress Notes (Signed)
Subjective:    Patient ID: [name], female    DOB: May 31, 1945, 72 y.o.   MRN: [id number]  HPI The patient is here for follow up.   Dental work: She has 3 broken teeth on the top and she will have them removed and have upper dentures placed.  She needs a form signed by me because of her chronic lung disease and recurrent lung infections.  Fatigue, physical deconditioning:  She is very tired.  As long as she keeps moving she is ok, but when she stops she needs to sit down - she is SOB and very tired.  This has started a while ago.  Pulmonary has recommended increasing her exercise.  She is not currently exercising.  She wonders about doing home PT again-this has helped in the past.  She does get short of breath with minimal activity.  Diabetes: She is taking her medication daily as prescribed. She is compliant with a diabetic diet. She is none exercising regularly. She monitors her sugars and they have been running 120's. She checks her feet daily and denies foot lesions. She is not up-to-date with an ophthalmology examination.   Chronic diastolic dysfunction, Hypertension: She is taking her medication daily. She is compliant with a low sodium diet.  She denies chest pain and regular headaches. She is not exercising regularly.      COPD, chronic resp failure with hypoxia:  She is on continuous oxygen and daily steroids.  She follows with pulmonary.  She had a recent COPD flare and she is on a zpak and feeling better.  Her steroids were increased.  She feels back at her baseline.  She denies any coughing or wheezing at this time.  Anxiety: She is taking her Paxil daily as prescribed. She denies any side effects from the medication.  She takes the Xanax as needed.  She feels the Xanax works well and helps prevent panic attacks when she is unable to breathe.  She still feels generalized anxiety and wonders about increasing the Paxil.  Depression: She is taking her Paxil daily as prescribed.  She denies any side effects from the medication. She feels her depression is not well controlled.  When she first started the medication she felt it worked well.   GERD:  She is taking her medication daily as prescribed.  She denies any GERD symptoms and feels her GERD is well controlled.    Medications and allergies reviewed with patient and updated if appropriate.  Patient Active Problem List   Diagnosis Date Noted  . Depression 06/24/2016  . Pneumonia of both lungs due to infectious organism   . Chronic respiratory failure with hypoxia ([place])   . Pulmonary hypertension ([place])   . Physical deconditioning 11/15/2015  . GERD (gastroesophageal reflux disease) 11/01/2015  . 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 Diabetes ([place]) 01/23/2015  . Fatigue 01/20/2015  . Chronic obstructive pulmonary disease ([place]) 10/18/2014  . Obesity, unspecified 03/15/2013  . Chronic diastolic heart failure ([place]) 03/14/2013  . Chronic respiratory failure ([place]) 06/30/2012  . COPD (chronic obstructive pulmonary disease) ([place])   . Tobacco abuse   . Pulmonary nodule 03/22/2010  . 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 Bronchiectasis 03/22/2010  . Hyperlipidemia 06/13/2008  . Essential hypertension 03/06/2007  . Anxiety 02/20/2007  . MITRAL VALVE PROLAPSE 02/20/2007  . CERVICAL CANCER, HX OF 02/20/2007    Current Outpatient Medications on File Prior to Visit  Medication Sig Dispense Refill  . albuterol (PROVENTIL HFA;VENTOLIN HFA) 108 (90  Base) MCG/ACT inhaler Inhale 2 puffs into the lungs every 6 (six) hours as needed for wheezing or shortness of breath. 1 Inhaler 5  . ALPRAZolam (XANAX) 0.5 MG tablet Take 1 tablet (0.5 mg total) by mouth 3 (three) times daily as needed for anxiety. -- Office visit needed for further refills 90 tablet 0  . fluticasone (FLONASE) 50 MCG/ACT nasal spray USE 2 SPRAYS IN EACH NOSTRIL EVERY DAY AS NEEDED FOR ALLERGIES AND RHINITIS 48 g 1  . furosemide (LASIX) 20 MG tablet Take 3 tablets  (60 mg total) by mouth 2 (two) times daily. 270 tablet 0  . GLUCOCOM LANCETS 33G MISC Use with Test Strips to take blood sugars 100 each 5  . glucose blood (BAYER CONTOUR NEXT TEST) test strip Use to take blood sugars twice daily. 100 each 3  . Insulin Glargine (LANTUS SOLOSTAR) 100 UNIT/ML Solostar Pen Inject 32 Units into the skin daily at 10 pm. -- Office visit needed for further refills 5 pen 0  . Insulin Pen Needle (BD PEN NEEDLE NANO U/F) 32G X 4 MM MISC USE TO ADMINISTER LANTUS INSULIN 100 each 3  . pantoprazole (PROTONIX) 40 MG tablet Take 1 tablet (40 mg total) by mouth daily. 30 tablet 0  . predniSONE (DELTASONE) 10 MG tablet 40mg x3day,30mg x3day,20mg continue daily (Patient taking differently: Take 20 mg by mouth daily. ) 180 tablet 0  . TRELEGY ELLIPTA 100-62.5-25 MCG/INH AEPB TAKE 1 PUFF BY MOUTH EVERY DAY 60 each 4   No current facility-administered medications on file prior to visit.     Past Medical History:  Diagnosis Date  . Arrhythmia   . Bronchiectasis march 2012   On CT chest. RUL. Mild  . CHF (congestive heart failure) ([place])   . Chicken pox   . Chronic diastolic heart failure ([place])   . Chronic respiratory failure ([place])    Followed in Pulmonary clinic/ [facility]/ Ramaswamy  - 06/30/2012 desat to 86%  RA walking 50 ft, recovered to 90% at rest - 06/30/2012  Walked 1lpm x 3 laps @ 185 ft each stopped due to  Sob, no desat  rec 02 2lpm with activity and sleeping, ok at rest   . COPD (chronic obstructive pulmonary disease) ([place])     FEV-1 in 2008 was 63% with a diffusion capacity of 33%.   . Generalized anxiety disorder   . History of cervical cancer 1982  . Hyperlipidemia   . Hypertension   . Leg swelling    Venous doppler right 07/21/12 >>Neg    . 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 Medical non-compliance   . MITRAL VALVE PROLAPSE   . Obesity, unspecified   . Pulmonary nodule march 2012   18mm RUL and RLL 1st seen march  2012 CT, ?progression 12/2012 CT  . Seasonal allergies   . Tobacco abuse     Smokes one pack a day since age 70.  . Tobacco abuse     Past Surgical History:  Procedure Laterality Date  . TOTAL ABDOMINAL HYSTERECTOMY      Social History   Socioeconomic History  . Marital status: Divorced    Spouse name: Not on file  . Number of children: Not on file  . Years of education: Not on file  . Highest education level: Not on file  Occupational History  . Occupation: works at a call center  Social Needs  . Financial resource strain: Not on file  .  Food insecurity:    Worry: Not on file    Inability: Not on file  . Transportation needs:    Medical: Not on file    Non-medical: Not on file  Tobacco Use  . Smoking status: Current Every Day Smoker    Packs/day: 0.50    Years: 44.00    Pack years: 22.00    Types: Cigarettes  . Smokeless tobacco: Never Used  . Tobacco comment: 2 cigs per day as of 12/23/16 ep  Substance and Sexual Activity  . Alcohol use: Yes    Alcohol/week: 0.0 oz    Comment: occasional  . Drug use: No  . Sexual activity: Not on file  Lifestyle  . Physical activity:    Days per week: Not on file    Minutes per session: Not on file  . Stress: Not on file  Relationships  . Social connections:    Talks on phone: Not on file    Gets together: Not on file    Attends religious service: Not on file    Active member of club or organization: Not on file    Attends meetings of clubs or organizations: Not on file    Relationship status: Not on file  Other Topics Concern  . Not on file  Social History Narrative   Married but recently separated from husband    Family History  Problem Relation Age of Onset  . Other Father        MVA  . Esophageal cancer Mother     Review of Systems  Constitutional: Positive for appetite change (decreased). Negative for fever.  Respiratory: Positive for shortness of breath. Negative for cough and wheezing.   Cardiovascular:  Positive for palpitations (with SOB) and leg swelling (minimal). Negative for chest pain.  Gastrointestinal: Negative for abdominal pain.  Neurological: Positive for light-headedness. Negative for headaches.       Objective:   Vitals:   07/01/17 1550  BP: 100/70  Pulse: (!) 119  Resp: 20  Temp: 98.1 F (36.7 C)  SpO2: 93%   BP Readings from Last 3 Encounters:  07/01/17 100/70  06/25/17 110/62  12/23/16 112/70   Wt Readings from Last 3 Encounters:  07/01/17 222 lb (100.7 kg)  12/23/16 228 lb 9.6 oz (103.7 kg)  06/19/16 226 lb (102.5 kg)   Body mass index is 38.11 kg/m.   Physical Exam    Constitutional: Appears well-developed and well-nourished. No distress.  HENT:  Head: Normocephalic and atraumatic.  Neck: Neck supple. No tracheal deviation present. No thyromegaly present.  No cervical lymphadenopathy Cardiovascular: Normal rate, regular rhythm and normal heart sounds.    No carotid bruit .  No edema Pulmonary/Chest: Effort normal and breath sounds normal. No respiratory distress. No has no wheezes. No rales.  Skin: Skin is warm and dry. Not diaphoretic.  Dryness bilateral posterior elbows Psychiatric: Normal mood and affect. Behavior is normal.   Diabetic Foot Exam - Simple   Simple Foot Form Diabetic Foot exam was performed with the following findings:  Yes 07/01/2017  4:39 PM  Visual Inspection No deformities, no ulcerations, no other skin breakdown bilaterally:  Yes Sensation Testing Intact to touch and monofilament testing bilaterally:  Yes Pulse Check Posterior Tibialis and Dorsalis pulse intact bilaterally:  Yes Comments       Assessment & Plan:    See Problem List for Assessment and Plan of chronic medical problems.

## 2017-07-01 ENCOUNTER — Telehealth: Payer: Self-pay | Admitting: Internal Medicine

## 2017-07-01 ENCOUNTER — Other Ambulatory Visit (INDEPENDENT_AMBULATORY_CARE_PROVIDER_SITE_OTHER): Payer: Medicare Other

## 2017-07-01 ENCOUNTER — Encounter: Payer: Self-pay | Admitting: Internal Medicine

## 2017-07-01 ENCOUNTER — Ambulatory Visit (INDEPENDENT_AMBULATORY_CARE_PROVIDER_SITE_OTHER): Payer: Medicare Other | Admitting: Internal Medicine

## 2017-07-01 VITALS — BP 100/70 | HR 119 | Temp 98.1°F | Resp 20 | Wt 222.0 lb

## 2017-07-01 DIAGNOSIS — E782 Mixed hyperlipidemia: Secondary | ICD-10-CM

## 2017-07-01 DIAGNOSIS — Z794 Long term (current) use of insulin: Secondary | ICD-10-CM | POA: Diagnosis not present

## 2017-07-01 DIAGNOSIS — J441 Chronic obstructive pulmonary disease with (acute) exacerbation: Secondary | ICD-10-CM

## 2017-07-01 DIAGNOSIS — R5383 Other fatigue: Secondary | ICD-10-CM | POA: Diagnosis not present

## 2017-07-01 DIAGNOSIS — R5381 Other malaise: Secondary | ICD-10-CM | POA: Diagnosis not present

## 2017-07-01 DIAGNOSIS — F3289 Other specified depressive episodes: Secondary | ICD-10-CM | POA: Diagnosis not present

## 2017-07-01 DIAGNOSIS — E119 Type 2 diabetes mellitus without complications: Secondary | ICD-10-CM

## 2017-07-01 DIAGNOSIS — L409 Psoriasis, unspecified: Secondary | ICD-10-CM

## 2017-07-01 DIAGNOSIS — K219 Gastro-esophageal reflux disease without esophagitis: Secondary | ICD-10-CM | POA: Diagnosis not present

## 2017-07-01 DIAGNOSIS — I10 Essential (primary) hypertension: Secondary | ICD-10-CM | POA: Diagnosis not present

## 2017-07-01 DIAGNOSIS — I5032 Chronic diastolic (congestive) heart failure: Secondary | ICD-10-CM

## 2017-07-01 DIAGNOSIS — F419 Anxiety disorder, unspecified: Secondary | ICD-10-CM | POA: Diagnosis not present

## 2017-07-01 LAB — COMPREHENSIVE METABOLIC PANEL
ALBUMIN: 4.3 g/dL (ref 3.5–5.2)
ALK PHOS: 53 U/L (ref 39–117)
ALT: 16 U/L (ref 0–35)
AST: 16 U/L (ref 0–37)
BUN: 25 mg/dL — ABNORMAL HIGH (ref 6–23)
CHLORIDE: 104 meq/L (ref 96–112)
CO2: 27 mEq/L (ref 19–32)
Calcium: 9.9 mg/dL (ref 8.4–10.5)
Creatinine, Ser: 1.48 mg/dL — ABNORMAL HIGH (ref 0.40–1.20)
GFR: 44.82 mL/min — AB (ref >60.00)
Glucose, Bld: 105 mg/dL — ABNORMAL HIGH (ref 70–99)
POTASSIUM: 3.5 meq/L (ref 3.5–5.1)
SODIUM: 143 meq/L (ref 135–145)
TOTAL PROTEIN: 7.2 g/dL (ref 6.0–8.3)
Total Bilirubin: 0.3 mg/dL (ref 0.2–1.2)

## 2017-07-01 LAB — CBC WITH DIFFERENTIAL/PLATELET
BASOS PCT: 0.6 % (ref 0.0–3.0)
Basophils Absolute: 0.1 10*3/uL (ref 0.0–0.1)
EOS PCT: 0 % (ref 0.0–5.0)
Eosinophils Absolute: 0 10*3/uL (ref 0.0–0.7)
HCT: 43.7 % (ref 36.0–46.0)
HEMOGLOBIN: 13.7 g/dL (ref 12.0–15.0)
Lymphocytes Relative: 8.3 % — ABNORMAL LOW (ref 12.0–46.0)
Lymphs Abs: 0.8 10*3/uL (ref 0.7–4.0)
MCHC: 31.3 g/dL (ref 30.0–36.0)
MCV: 86.4 fl (ref 78.0–100.0)
MONO ABS: 0.7 10*3/uL (ref 0.1–1.0)
MONOS PCT: 7.1 % (ref 3.0–12.0)
Neutro Abs: 8.1 10*3/uL — ABNORMAL HIGH (ref 1.4–7.7)
Neutrophils Relative %: 84 % — ABNORMAL HIGH (ref 43.0–77.0)
Platelets: 294 10*3/uL (ref 150.0–400.0)
RBC: 5.06 Mil/uL (ref 3.87–5.11)
RDW: 17 % — ABNORMAL HIGH (ref 11.5–15.5)
WBC: 9.6 10*3/uL (ref 4.0–10.5)

## 2017-07-01 LAB — LIPID PANEL
Cholesterol: 232 mg/dL — ABNORMAL HIGH (ref 0–200)
HDL: 52.6 mg/dL (ref >39.00)
NonHDL: 179.09
Total CHOL/HDL Ratio: 4
Triglycerides: 233 mg/dL — ABNORMAL HIGH (ref 0.0–149.0)
VLDL: 46.6 mg/dL — AB (ref 0.0–40.0)

## 2017-07-01 LAB — TSH: TSH: 0.99 u[IU]/mL (ref 0.35–4.50)

## 2017-07-01 LAB — LDL CHOLESTEROL, DIRECT: LDL DIRECT: 141 mg/dL

## 2017-07-01 LAB — HEMOGLOBIN A1C: HEMOGLOBIN A1C: 6.5 % (ref 4.6–6.5)

## 2017-07-01 MED ORDER — IPRATROPIUM-ALBUTEROL 0.5-2.5 (3) MG/3ML IN SOLN: 3.0000 mL | Freq: Four times a day (QID) | RESPIRATORY_TRACT | 5 refills | Status: DC | PRN

## 2017-07-01 MED ORDER — CLOBETASOL PROPIONATE 0.05 % EX CREA: 1.0000 "application " | TOPICAL_CREAM | Freq: Two times a day (BID) | CUTANEOUS | 5 refills | Status: DC

## 2017-07-01 MED ORDER — PAROXETINE HCL 40 MG PO TABS: 40.0000 mg | ORAL_TABLET | ORAL | 1 refills | Status: DC

## 2017-07-01 MED ORDER — ALPRAZOLAM 0.5 MG PO TABS: 0.5000 mg | ORAL_TABLET | Freq: Three times a day (TID) | ORAL | 0 refills | Status: DC | PRN

## 2017-07-01 NOTE — Assessment & Plan Note (Signed)
COPD with chronic respiratory failure on oxygen therapy Following with pulmonary-management per them She is still smoking Her palliative care nurse requested changing albuterol neb to DuoNeb-we will prescribe

## 2017-07-01 NOTE — Assessment & Plan Note (Signed)
Fatigue is chronic and likely multifactorial We will check basic blood work including CBC, CMP, TSH Discussed the importance of increasing her activity level Severe COPD and chronic respiratory failure likely contributing significantly

## 2017-07-01 NOTE — Assessment & Plan Note (Signed)
She is significant dryness bilateral posterior elbows and probable psoriasis Over-the-counter lotions and creams not effective We will try steroid cream

## 2017-07-01 NOTE — Assessment & Plan Note (Signed)
Euvolemic on exam Continue current medications CMP 

## 2017-07-01 NOTE — Assessment & Plan Note (Signed)
GERD controlled Continue daily medication  

## 2017-07-01 NOTE — Assessment & Plan Note (Signed)
Not currently on a statin Will check lipid panel, CMP, TSH Encouraged increasing activity level

## 2017-07-01 NOTE — Telephone Encounter (Signed)
Copied from [place] [id number]. Topic: Quick Communication - Rx Refill/Question >> Jul 01, 2017  5:25 PM Neva Seat wrote: ipratropium-albuterol (DUONEB) 0.5-2.5 (3) MG/3ML SOLN  Needing diagnosis code to fill Rx.  [id number] [place] [place] [address] Phone: [phone] Fax: [phone]

## 2017-07-01 NOTE — Patient Instructions (Addendum)
  Test(s) ordered today. Your results will be released to [place] (or called to you) after review, usually within 72hours after test completion. If any changes need to be made, you will be notified at that same time.  All other Health Maintenance issues reviewed.   All recommended immunizations and age-appropriate screenings are up-to-date or discussed.  No immunizations administered today.   Medications reviewed and updated.  Changes include a steroid cream for your elbows.  We will increase the paxil to 40 mg daily.  We have changed the duoneb.   Your prescription(s) have been submitted to your pharmacy. Please take as directed and contact our office if you believe you are having problem(s) with the medication(s).  A referral was ordered for home PT  Please followup in 6 months

## 2017-07-01 NOTE — Addendum Note (Signed)
Addended by: Binnie Rail on: 07/01/2017 04:48 PM   Modules accepted: Orders

## 2017-07-01 NOTE — Assessment & Plan Note (Signed)
BP well controlled Current regimen effective and well tolerated Continue current medications at current doses cmp  

## 2017-07-01 NOTE — Assessment & Plan Note (Signed)
She has significant physical deconditioning and is limited what she can do Mostly related to severe COPD and chronic respiratory failure, obesity and sedentary lifestyle Home PT ordered

## 2017-07-01 NOTE — Assessment & Plan Note (Signed)
Not ideally controlled Will try increasing Paxil to 40 mg daily

## 2017-07-01 NOTE — Assessment & Plan Note (Signed)
Compliant with diabetic diet Sugars well controlled at home Will check A1c Stressed eye exam-she has not had one in a while and is experiencing blurry vision-discussed that this could be related to high-dose and chronic steroid use

## 2017-07-01 NOTE — Assessment & Plan Note (Signed)
Has some generalized anxiety along with intermittent increase in anxiety Overall anxiety not ideally controlled Will try increasing Paxil 40 mg daily Continue Xanax 3 times daily as needed

## 2017-07-02 MED ORDER — IPRATROPIUM-ALBUTEROL 0.5-2.5 (3) MG/3ML IN SOLN: 3.0000 mL | Freq: Four times a day (QID) | RESPIRATORY_TRACT | 5 refills | Status: DC | PRN

## 2017-07-02 NOTE — Telephone Encounter (Signed)
Call to pharmacy- please resend the prescription with the diagnosis code on it for insurance coverage

## 2017-07-04 ENCOUNTER — Other Ambulatory Visit: Payer: Self-pay | Admitting: Emergency Medicine

## 2017-07-04 DIAGNOSIS — N289 Disorder of kidney and ureter, unspecified: Secondary | ICD-10-CM

## 2017-07-07 ENCOUNTER — Telehealth: Payer: Self-pay | Admitting: Internal Medicine

## 2017-07-07 DIAGNOSIS — E119 Type 2 diabetes mellitus without complications: Secondary | ICD-10-CM

## 2017-07-07 DIAGNOSIS — Z7952 Long term (current) use of systemic steroids: Secondary | ICD-10-CM

## 2017-07-07 NOTE — Telephone Encounter (Signed)
Spoke with pt to clarify results.   Pt also would like to know if we were going to refer her for Glaucoma? I do not see referral entered.

## 2017-07-07 NOTE — Telephone Encounter (Signed)
Copied from [place] [id number]. Topic: Quick Communication - See Telephone Encounter >> Jul 07, 2017 10:30 AM Bea Graff, NT wrote: CRM for notification. See Telephone encounter for: 07/07/17. Pt has some questions for Lovena Le regarding her test results that she received via voicemail on Friday. Requesting callback

## 2017-07-07 NOTE — Telephone Encounter (Signed)
She does not actually need a referral, but I did put one in in case she does not have her no open eye doctor.  They will call her to schedule

## 2017-07-07 NOTE — Telephone Encounter (Signed)
Spoke with pt to inform.  

## 2017-07-10 DIAGNOSIS — Z7951 Long term (current) use of inhaled steroids: Secondary | ICD-10-CM | POA: Diagnosis not present

## 2017-07-10 DIAGNOSIS — E119 Type 2 diabetes mellitus without complications: Secondary | ICD-10-CM | POA: Diagnosis not present

## 2017-07-10 DIAGNOSIS — F329 Major depressive disorder, single episode, unspecified: Secondary | ICD-10-CM | POA: Diagnosis not present

## 2017-07-10 DIAGNOSIS — I272 Pulmonary hypertension, unspecified: Secondary | ICD-10-CM | POA: Diagnosis not present

## 2017-07-10 DIAGNOSIS — F419 Anxiety disorder, unspecified: Secondary | ICD-10-CM | POA: Diagnosis not present

## 2017-07-10 DIAGNOSIS — I110 Hypertensive heart disease with heart failure: Secondary | ICD-10-CM | POA: Diagnosis not present

## 2017-07-10 DIAGNOSIS — K219 Gastro-esophageal reflux disease without esophagitis: Secondary | ICD-10-CM | POA: Diagnosis not present

## 2017-07-10 DIAGNOSIS — Z7952 Long term (current) use of systemic steroids: Secondary | ICD-10-CM | POA: Diagnosis not present

## 2017-07-10 DIAGNOSIS — E782 Mixed hyperlipidemia: Secondary | ICD-10-CM | POA: Diagnosis not present

## 2017-07-10 DIAGNOSIS — J449 Chronic obstructive pulmonary disease, unspecified: Secondary | ICD-10-CM | POA: Diagnosis not present

## 2017-07-10 DIAGNOSIS — I5032 Chronic diastolic (congestive) heart failure: Secondary | ICD-10-CM | POA: Diagnosis not present

## 2017-07-10 DIAGNOSIS — Z9981 Dependence on supplemental oxygen: Secondary | ICD-10-CM | POA: Diagnosis not present

## 2017-07-10 DIAGNOSIS — J479 Bronchiectasis, uncomplicated: Secondary | ICD-10-CM | POA: Diagnosis not present

## 2017-07-10 DIAGNOSIS — E669 Obesity, unspecified: Secondary | ICD-10-CM | POA: Diagnosis not present

## 2017-07-10 DIAGNOSIS — J9610 Chronic respiratory failure, unspecified whether with hypoxia or hypercapnia: Secondary | ICD-10-CM | POA: Diagnosis not present

## 2017-07-10 DIAGNOSIS — Z794 Long term (current) use of insulin: Secondary | ICD-10-CM | POA: Diagnosis not present

## 2017-07-10 DIAGNOSIS — F17210 Nicotine dependence, cigarettes, uncomplicated: Secondary | ICD-10-CM | POA: Diagnosis not present

## 2017-07-11 ENCOUNTER — Telehealth: Payer: Self-pay | Admitting: Internal Medicine

## 2017-07-11 NOTE — Telephone Encounter (Signed)
Jerilynn called back and forgot to mention the patient complained of a toothache while she was there and the patient mentioned she had had thrush before and she looked and there was some white. Does [place] want to order anything for this?   Also she noticed her nurse aid might not be doing a good job or paying attention, her oxygen machine was missing one filter and the other one had not been cleaned in awhile the machines was blinking yellow and she cleaned it up and it blinked green. The patient doesn't want her coming at the same time as the aid but if this continues she will insist so she can talk to the aid.

## 2017-07-11 NOTE — Telephone Encounter (Signed)
LVM giving verbal orders per MD for pt.   Power scooter will need OV in order to get approved through insurance.

## 2017-07-11 NOTE — Telephone Encounter (Signed)
Copied from [place]. Topic: Quick Communication - See Telephone Encounter >> Jul 11, 2017  9:58 AM Ahmed Prima L wrote: CRM for notification. See Telephone encounter for: 07/11/17.  Cecile Hearing, physical therapist from [facility] needs verbals for one time a week for one week, two times a week for three weeks and then one time a week for 3 weeks. She would like to suggest getting orders for her to get a power scooter? Could that be approved? Call back @ [phone]

## 2017-07-14 DIAGNOSIS — I110 Hypertensive heart disease with heart failure: Secondary | ICD-10-CM | POA: Diagnosis not present

## 2017-07-14 DIAGNOSIS — I272 Pulmonary hypertension, unspecified: Secondary | ICD-10-CM | POA: Diagnosis not present

## 2017-07-14 DIAGNOSIS — J449 Chronic obstructive pulmonary disease, unspecified: Secondary | ICD-10-CM | POA: Diagnosis not present

## 2017-07-14 DIAGNOSIS — E119 Type 2 diabetes mellitus without complications: Secondary | ICD-10-CM | POA: Diagnosis not present

## 2017-07-14 DIAGNOSIS — I5032 Chronic diastolic (congestive) heart failure: Secondary | ICD-10-CM | POA: Diagnosis not present

## 2017-07-14 DIAGNOSIS — J9610 Chronic respiratory failure, unspecified whether with hypoxia or hypercapnia: Secondary | ICD-10-CM | POA: Diagnosis not present

## 2017-07-14 MED ORDER — NYSTATIN 100000 UNIT/ML MT SUSP: 5.0000 mL | Freq: Four times a day (QID) | OROMUCOSAL | 0 refills | Status: DC

## 2017-07-14 NOTE — Telephone Encounter (Signed)
Please advise on possible thrush

## 2017-07-14 NOTE — Addendum Note (Signed)
Addended by: Binnie Rail on: 07/14/2017 12:08 PM   Modules accepted: Orders

## 2017-07-14 NOTE — Telephone Encounter (Signed)
Nystatin sent to pof

## 2017-07-14 NOTE — Telephone Encounter (Signed)
LVM informing pt

## 2017-07-15 ENCOUNTER — Telehealth: Payer: Self-pay | Admitting: Internal Medicine

## 2017-07-15 MED ORDER — TRIAMCINOLONE ACETONIDE 0.1 % EX CREA
1.0000 | TOPICAL_CREAM | Freq: Two times a day (BID) | CUTANEOUS | 0 refills | Status: DC
Start: 2017-07-15 — End: 2018-10-14

## 2017-07-15 NOTE — Telephone Encounter (Signed)
Copied from [place] [id number]. Topic: Quick Communication - See Telephone Encounter >> Jul 15, 2017 10:18 AM Rutherford Nail, NT wrote: CRM for notification. See Telephone encounter for: 07/15/17. Patient calling and states that her insurance will not cover the clobetasol cream (TEMOVATE) 0.05 %. States it is $68. She would like something sent in place of this cream. Please advise. CB#: [phone] [id number] [place] [address] [place] - [place]

## 2017-07-15 NOTE — Telephone Encounter (Signed)
Spoke with pt to inform.  

## 2017-07-15 NOTE — Telephone Encounter (Signed)
New rx sent

## 2017-07-18 DIAGNOSIS — E119 Type 2 diabetes mellitus without complications: Secondary | ICD-10-CM | POA: Diagnosis not present

## 2017-07-18 DIAGNOSIS — I272 Pulmonary hypertension, unspecified: Secondary | ICD-10-CM | POA: Diagnosis not present

## 2017-07-18 DIAGNOSIS — J9610 Chronic respiratory failure, unspecified whether with hypoxia or hypercapnia: Secondary | ICD-10-CM | POA: Diagnosis not present

## 2017-07-18 DIAGNOSIS — I110 Hypertensive heart disease with heart failure: Secondary | ICD-10-CM | POA: Diagnosis not present

## 2017-07-18 DIAGNOSIS — J449 Chronic obstructive pulmonary disease, unspecified: Secondary | ICD-10-CM | POA: Diagnosis not present

## 2017-07-18 DIAGNOSIS — I5032 Chronic diastolic (congestive) heart failure: Secondary | ICD-10-CM | POA: Diagnosis not present

## 2017-07-21 ENCOUNTER — Other Ambulatory Visit: Payer: Medicare Other | Admitting: Licensed Clinical Social Worker

## 2017-07-21 ENCOUNTER — Telehealth: Payer: Self-pay | Admitting: Licensed Clinical Social Worker

## 2017-07-21 NOTE — Telephone Encounter (Signed)
Palliative Care SW received a call from patient stating she had another appointment today and had to reschedule the SW/RN visit.  Plan is to reschedule visit next week.

## 2017-07-23 ENCOUNTER — Telehealth: Payer: Self-pay | Admitting: Internal Medicine

## 2017-07-23 DIAGNOSIS — Z1212 Encounter for screening for malignant neoplasm of rectum: Secondary | ICD-10-CM | POA: Diagnosis not present

## 2017-07-23 DIAGNOSIS — J9610 Chronic respiratory failure, unspecified whether with hypoxia or hypercapnia: Secondary | ICD-10-CM | POA: Diagnosis not present

## 2017-07-23 DIAGNOSIS — E119 Type 2 diabetes mellitus without complications: Secondary | ICD-10-CM | POA: Diagnosis not present

## 2017-07-23 DIAGNOSIS — I110 Hypertensive heart disease with heart failure: Secondary | ICD-10-CM | POA: Diagnosis not present

## 2017-07-23 DIAGNOSIS — I272 Pulmonary hypertension, unspecified: Secondary | ICD-10-CM | POA: Diagnosis not present

## 2017-07-23 DIAGNOSIS — Z1211 Encounter for screening for malignant neoplasm of colon: Secondary | ICD-10-CM | POA: Diagnosis not present

## 2017-07-23 DIAGNOSIS — J449 Chronic obstructive pulmonary disease, unspecified: Secondary | ICD-10-CM | POA: Diagnosis not present

## 2017-07-23 DIAGNOSIS — I5032 Chronic diastolic (congestive) heart failure: Secondary | ICD-10-CM | POA: Diagnosis not present

## 2017-07-23 NOTE — Telephone Encounter (Signed)
Copied from [place] [id number]. Topic: Inquiry >> Jul 23, 2017  3:52 PM Scherrie Gerlach wrote: Reason for CRM: Merrilee Seashore with [facility] PT calling to report pt told him she fell off the couch this past Sat (fell asleep and rolled off). No injury. Arm a little stiff the day after, pt states ok now. Merrilee Seashore just saw her for the first time today and she told him this.

## 2017-07-25 DIAGNOSIS — J9610 Chronic respiratory failure, unspecified whether with hypoxia or hypercapnia: Secondary | ICD-10-CM | POA: Diagnosis not present

## 2017-07-25 DIAGNOSIS — E119 Type 2 diabetes mellitus without complications: Secondary | ICD-10-CM | POA: Diagnosis not present

## 2017-07-25 DIAGNOSIS — I110 Hypertensive heart disease with heart failure: Secondary | ICD-10-CM | POA: Diagnosis not present

## 2017-07-25 DIAGNOSIS — J449 Chronic obstructive pulmonary disease, unspecified: Secondary | ICD-10-CM | POA: Diagnosis not present

## 2017-07-25 DIAGNOSIS — I5032 Chronic diastolic (congestive) heart failure: Secondary | ICD-10-CM | POA: Diagnosis not present

## 2017-07-25 DIAGNOSIS — I272 Pulmonary hypertension, unspecified: Secondary | ICD-10-CM | POA: Diagnosis not present

## 2017-07-25 LAB — COLOGUARD: Cologuard: POSITIVE

## 2017-07-27 NOTE — Telephone Encounter (Signed)
noted 

## 2017-07-28 ENCOUNTER — Telehealth: Payer: Self-pay | Admitting: *Deleted

## 2017-07-28 NOTE — Telephone Encounter (Signed)
Contacted and left voicemail for patient to arrange home care visit for this month. Left Palliative Care contact information for return call.

## 2017-07-29 DIAGNOSIS — I5032 Chronic diastolic (congestive) heart failure: Secondary | ICD-10-CM | POA: Diagnosis not present

## 2017-07-29 DIAGNOSIS — I272 Pulmonary hypertension, unspecified: Secondary | ICD-10-CM | POA: Diagnosis not present

## 2017-07-29 DIAGNOSIS — J9610 Chronic respiratory failure, unspecified whether with hypoxia or hypercapnia: Secondary | ICD-10-CM | POA: Diagnosis not present

## 2017-07-29 DIAGNOSIS — J449 Chronic obstructive pulmonary disease, unspecified: Secondary | ICD-10-CM | POA: Diagnosis not present

## 2017-07-29 DIAGNOSIS — E119 Type 2 diabetes mellitus without complications: Secondary | ICD-10-CM | POA: Diagnosis not present

## 2017-07-29 DIAGNOSIS — I110 Hypertensive heart disease with heart failure: Secondary | ICD-10-CM | POA: Diagnosis not present

## 2017-07-30 ENCOUNTER — Telehealth: Payer: Self-pay | Admitting: Internal Medicine

## 2017-07-30 DIAGNOSIS — K219 Gastro-esophageal reflux disease without esophagitis: Secondary | ICD-10-CM | POA: Diagnosis not present

## 2017-07-30 DIAGNOSIS — Z7952 Long term (current) use of systemic steroids: Secondary | ICD-10-CM

## 2017-07-30 DIAGNOSIS — I272 Pulmonary hypertension, unspecified: Secondary | ICD-10-CM | POA: Diagnosis not present

## 2017-07-30 DIAGNOSIS — F329 Major depressive disorder, single episode, unspecified: Secondary | ICD-10-CM | POA: Diagnosis not present

## 2017-07-30 DIAGNOSIS — F1210 Cannabis abuse, uncomplicated: Secondary | ICD-10-CM | POA: Diagnosis not present

## 2017-07-30 DIAGNOSIS — E782 Mixed hyperlipidemia: Secondary | ICD-10-CM | POA: Diagnosis not present

## 2017-07-30 DIAGNOSIS — Z794 Long term (current) use of insulin: Secondary | ICD-10-CM

## 2017-07-30 DIAGNOSIS — Z9981 Dependence on supplemental oxygen: Secondary | ICD-10-CM

## 2017-07-30 DIAGNOSIS — E669 Obesity, unspecified: Secondary | ICD-10-CM | POA: Diagnosis not present

## 2017-07-30 DIAGNOSIS — F419 Anxiety disorder, unspecified: Secondary | ICD-10-CM | POA: Diagnosis not present

## 2017-07-30 DIAGNOSIS — I110 Hypertensive heart disease with heart failure: Secondary | ICD-10-CM | POA: Diagnosis not present

## 2017-07-30 DIAGNOSIS — I5032 Chronic diastolic (congestive) heart failure: Secondary | ICD-10-CM | POA: Diagnosis not present

## 2017-07-30 DIAGNOSIS — J479 Bronchiectasis, uncomplicated: Secondary | ICD-10-CM | POA: Diagnosis not present

## 2017-07-30 DIAGNOSIS — J449 Chronic obstructive pulmonary disease, unspecified: Secondary | ICD-10-CM

## 2017-07-30 DIAGNOSIS — E119 Type 2 diabetes mellitus without complications: Secondary | ICD-10-CM | POA: Diagnosis not present

## 2017-07-30 DIAGNOSIS — Z7951 Long term (current) use of inhaled steroids: Secondary | ICD-10-CM

## 2017-07-30 NOTE — Telephone Encounter (Signed)
Called and spoke with Patient.  Patient stated that [facility] changed her O2 concentrator.  The concentrator she had before went up to 10L O2 and she was left with a concentrator that goes up to 5L.  O2 order placed 12/23/2016, O2 concentrator for 4-6L. I called and spoke with [place], [facility].  He was going to reach out to the Patient to see what concentrator she has and to take care of the problem.

## 2017-08-01 ENCOUNTER — Other Ambulatory Visit: Payer: Self-pay | Admitting: Internal Medicine

## 2017-08-01 DIAGNOSIS — I110 Hypertensive heart disease with heart failure: Secondary | ICD-10-CM | POA: Diagnosis not present

## 2017-08-01 DIAGNOSIS — E119 Type 2 diabetes mellitus without complications: Secondary | ICD-10-CM | POA: Diagnosis not present

## 2017-08-01 DIAGNOSIS — I5032 Chronic diastolic (congestive) heart failure: Secondary | ICD-10-CM | POA: Diagnosis not present

## 2017-08-01 DIAGNOSIS — J9610 Chronic respiratory failure, unspecified whether with hypoxia or hypercapnia: Secondary | ICD-10-CM | POA: Diagnosis not present

## 2017-08-01 DIAGNOSIS — J449 Chronic obstructive pulmonary disease, unspecified: Secondary | ICD-10-CM | POA: Diagnosis not present

## 2017-08-01 DIAGNOSIS — I272 Pulmonary hypertension, unspecified: Secondary | ICD-10-CM | POA: Diagnosis not present

## 2017-08-01 NOTE — Telephone Encounter (Signed)
Copied from [place] [id number]. Topic: Quick Communication - Rx Refill/Question >> Aug 01, 2017 11:52 AM Bea Graff, NT wrote: Medication: ALPRAZolam Duanne Moron) 0.5 MG tablet  Has the patient contacted their pharmacy? Yes.   (Agent: If no, request that the patient contact the pharmacy for the refill.) (Agent: If yes, when and what did the pharmacy advise?)  Preferred Pharmacy (with phone number or street name): [id number] [place], [place] [phone] (Phone) [phone] (Fax)      Agent: Please be advised that RX refills may take up to 3 business days. We ask that you follow-up with your pharmacy.

## 2017-08-01 NOTE — Telephone Encounter (Signed)
alprazolam refill Last Refill:07/01/17 # 90 Last OV: 07/01/17 PCP: Billey Gosling MD [facility] [address]

## 2017-08-01 NOTE — Telephone Encounter (Signed)
[place] Controlled Substance Database checked. Last filled on 07/01/17 

## 2017-08-04 ENCOUNTER — Telehealth: Payer: Self-pay | Admitting: *Deleted

## 2017-08-04 NOTE — Telephone Encounter (Signed)
Contacted and spoke with patient to arrange home visit. Visit scheduled for 08/06/17 at 12:00p

## 2017-08-05 ENCOUNTER — Encounter: Payer: Self-pay | Admitting: Internal Medicine

## 2017-08-05 ENCOUNTER — Telehealth: Payer: Self-pay | Admitting: Internal Medicine

## 2017-08-05 NOTE — Telephone Encounter (Signed)
Spoke with pt, she would like to wait before putting the referral in.

## 2017-08-05 NOTE — Telephone Encounter (Signed)
cologuard is positive.  This may be a false positive.  Anyone with a positive result should consider a colonoscopy, but I do not think she is a good candidate for a colonoscopy given her lung disease - we can refer to GI to discuss other evaluation if she wants

## 2017-08-06 ENCOUNTER — Other Ambulatory Visit: Payer: Medicare Other | Admitting: *Deleted

## 2017-08-06 ENCOUNTER — Telehealth: Payer: Self-pay | Admitting: Emergency Medicine

## 2017-08-06 DIAGNOSIS — Z515 Encounter for palliative care: Secondary | ICD-10-CM

## 2017-08-06 DIAGNOSIS — R195 Other fecal abnormalities: Secondary | ICD-10-CM

## 2017-08-06 NOTE — Telephone Encounter (Signed)
Copied from [place]. Topic: General - Other >> Aug 06, 2017  2:58 PM Keene Breath wrote: Reason for CRM: Patient called to let doctor know that it is ok to send referral for her to see the GI doctor.  Please advise.  CB# [phone].

## 2017-08-07 ENCOUNTER — Encounter: Payer: Self-pay | Admitting: Internal Medicine

## 2017-08-07 DIAGNOSIS — J9610 Chronic respiratory failure, unspecified whether with hypoxia or hypercapnia: Secondary | ICD-10-CM | POA: Diagnosis not present

## 2017-08-07 DIAGNOSIS — I5032 Chronic diastolic (congestive) heart failure: Secondary | ICD-10-CM | POA: Diagnosis not present

## 2017-08-07 DIAGNOSIS — I110 Hypertensive heart disease with heart failure: Secondary | ICD-10-CM | POA: Diagnosis not present

## 2017-08-07 DIAGNOSIS — E119 Type 2 diabetes mellitus without complications: Secondary | ICD-10-CM | POA: Diagnosis not present

## 2017-08-07 DIAGNOSIS — I272 Pulmonary hypertension, unspecified: Secondary | ICD-10-CM | POA: Diagnosis not present

## 2017-08-07 DIAGNOSIS — J449 Chronic obstructive pulmonary disease, unspecified: Secondary | ICD-10-CM | POA: Diagnosis not present

## 2017-08-07 NOTE — Progress Notes (Signed)
COMMUNITY PALLIATIVE CARE RN NOTE  PATIENT NAME: [name] DOB: 12/05/1945 MRN: [id number]  PRIMARY CARE PROVIDER: Binnie Rail, MD  RESPONSIBLE PARTY:  Acct ID - Guarantor Home Phone Work Phone Relationship Acct Type  [id number] - Wrinkle,Gerturde [phone]  Self P/F     [address], [place], [place] [address]    PLAN OF CARE and INTERVENTION:  1. ADVANCE CARE PLANNING/GOALS OF CARE: Remain in her home, avoid hospitalizations 2. PATIENT/CAREGIVER EDUCATION: Reinforced Safety Precautions and Energy Conservation 3. DISEASE STATUS: Upon arrival, patient opened the door and was dyspneic with exertion. Patient states that she was in the bathroom so tried to hurry to the door to answer it. Patient took about 60 seconds for her breathing to calm, but remained ok throughout visit from this point. Occasional productive cough. Phlegm is clear or white. No hemoptysis. Remains on 6L/min via [place]. At times Oxygen may drop into the lower 80s with exertion. Patient states that she recently took a Cologuard test and it came back positive. Patient was told that this could be a false positive and it was recommended that a referral be made to a GI doctor. At that time, patient says she was too anxious to decide whether or not to proceed with GI consult at the time of results, but states that she will call PCP tomorrow to have referral made. Patient also states that it was not recommended for her to have a Colonoscopy due to her respiratory status, so wants her to see the GI specialist for other options. Patient reports having regular BMs and denies any abdominal pain or discomfort. Bowel sounds present x 4. Patient remains able to ambulate using her walker, but requires assistance with bathing. Continues with a home health aide coming in twice weekly, Tuesdays and Fridays from 11a-3p. Continues with PT visits who is scheduled to come tomorrow. Visits were 2x/week for 3 weeks and is now 1x/week for the next 3 weeks  then she will be re-evaluated. Denies pain, but does get occasional headaches. States they usually go away once she eats something. Mainly eating 2-3 meals a day and other days may snack all day. Denies dysphagia. No recent medication changes. States that she has an appointment for lab work next week, as she was told that her kidney function tests were slightly elevated so PCP would like to re-check.    HISTORY OF PRESENT ILLNESS:  This is a 72 yo female who resides at home alone. Son stays with patient intermittently. Palliative Care to continue to follow patient to assess overall condition and assist with symptom management needs. Next visit scheduled in 1 month.  CODE STATUS: FULL CODE  ADVANCED DIRECTIVES: N MOST FORM: yes PPS: 40%   PHYSICAL EXAM:   VITALS: Today's Vitals   08/06/17 1220  BP: 112/72  Pulse: (!) 101  Resp: 20  SpO2: (!) 89%  PainSc: 0-No pain    LUNGS: decreased breath sounds CARDIAC: Cor Tachy EXTREMITIES: Trace edema to bilateral ankles/feet SKIN: Exposed skin dry and intact  NEURO: Alert and oriented x 3, pleasant mood, ambulates with walker, generalized weakness   (Duration of visit and documentation 75 minutes)    Daryl Eastern, RN, BSN

## 2017-08-07 NOTE — Telephone Encounter (Signed)
A referral was ordered

## 2017-08-08 ENCOUNTER — Other Ambulatory Visit: Payer: Medicare Other | Admitting: Licensed Clinical Social Worker

## 2017-08-08 ENCOUNTER — Other Ambulatory Visit: Payer: Self-pay | Admitting: Internal Medicine

## 2017-08-08 DIAGNOSIS — Z515 Encounter for palliative care: Secondary | ICD-10-CM

## 2017-08-08 NOTE — Progress Notes (Signed)
COMMUNITY PALLIATIVE CARE SW NOTE  PATIENT NAME: [name] DOB: 10/12/1945 MRN: [id number]  PRIMARY CARE PROVIDER: Binnie Rail, MD  RESPONSIBLE PARTY:  Acct ID - Guarantor Home Phone Work Phone Relationship Acct Type  [id number] [facility] [phone]  Self P/F     [address], [place], [place] [address]     PLAN OF CARE and INTERVENTIONS:             1. GOALS OF CARE/ ADVANCE CARE PLANNING:  Patient wants to remain in her apartment.  She is a full code. 2. SOCIAL/EMOTIONAL/SPIRITUAL ASSESSMENT/ INTERVENTIONS:  SW met with patient at her apartment.  She said her son recently moved out of her apartment.  He had lived with her periodically for the past five years and did not contribute financially per patient.  She reported feeling sad yesterday that he was gone, but felt much better today.  Patient engaged in conversation regarding her family.  SW provided active listening and supportive counseling. 3. PATIENT/CAREGIVER EDUCATION/ COPING:  Patient copes by expressing her feelings openly. 4. PERSONAL EMERGENCY PLAN:  Patient keeps her fan on to assist with her breathing.  She contacts Palliative Care as appropriate. 5. COMMUNITY RESOURCES COORDINATION/ HEALTH CARE NAVIGATION:  Patient has caregivers three days per week. 6. FINANCIAL/LEGAL CONCERNS/INTERVENTIONS:  Patient is on a fixed income.     SOCIAL HX:  Social History   Tobacco Use  . Smoking status: Current Every Day Smoker    Packs/day: 0.50    Years: 44.00    Pack years: 22.00    Types: Cigarettes  . Smokeless tobacco: Never Used  . Tobacco comment: 2 cigs per day as of 12/23/16 ep  Substance Use Topics  . Alcohol use: Yes    Alcohol/week: 0.0 oz    Comment: occasional    CODE STATUS:  Full code  ADVANCED DIRECTIVES: N MOST FORM COMPLETE:  N HOSPICE EDUCATION PROVIDED:  Not during current visit. PPS:  Patient reports her appetite is normal.  She can ambulate independently for short distances. Duration of  visit and documentation:  45 minutes.      Creola Corn Jannelle Notaro, LCSW

## 2017-08-11 ENCOUNTER — Telehealth: Payer: Self-pay | Admitting: Internal Medicine

## 2017-08-11 NOTE — Telephone Encounter (Signed)
Copied from [place] [id number]. Topic: Quick Communication - See Telephone Encounter >> Aug 11, 2017 11:22 AM Neva Seat wrote: Pt spoke w/ GI Dr office.  They do not want to schedule her for a colonoscopy because of her breathing, stating she would not make it through being put under. The GI office told pt there was other ways that she could do the colonoscopy. Pt is needing to discuss this w/ Dr. Quay Burow. Please call pt back.

## 2017-08-12 ENCOUNTER — Encounter: Payer: Self-pay | Admitting: Gastroenterology

## 2017-08-12 NOTE — Telephone Encounter (Signed)
There is a capsule endoscopy that she may be eligible for but only GI can get this approved and order it.     There are no other options.  She would need to meet with GI for this option.

## 2017-08-12 NOTE — Telephone Encounter (Signed)
LVM informing pt

## 2017-08-14 DIAGNOSIS — I272 Pulmonary hypertension, unspecified: Secondary | ICD-10-CM | POA: Diagnosis not present

## 2017-08-14 DIAGNOSIS — J449 Chronic obstructive pulmonary disease, unspecified: Secondary | ICD-10-CM | POA: Diagnosis not present

## 2017-08-14 DIAGNOSIS — I5032 Chronic diastolic (congestive) heart failure: Secondary | ICD-10-CM | POA: Diagnosis not present

## 2017-08-14 DIAGNOSIS — I110 Hypertensive heart disease with heart failure: Secondary | ICD-10-CM | POA: Diagnosis not present

## 2017-08-14 DIAGNOSIS — J9610 Chronic respiratory failure, unspecified whether with hypoxia or hypercapnia: Secondary | ICD-10-CM | POA: Diagnosis not present

## 2017-08-14 DIAGNOSIS — E119 Type 2 diabetes mellitus without complications: Secondary | ICD-10-CM | POA: Diagnosis not present

## 2017-08-20 DIAGNOSIS — I5032 Chronic diastolic (congestive) heart failure: Secondary | ICD-10-CM | POA: Diagnosis not present

## 2017-08-20 DIAGNOSIS — I110 Hypertensive heart disease with heart failure: Secondary | ICD-10-CM | POA: Diagnosis not present

## 2017-08-20 DIAGNOSIS — I272 Pulmonary hypertension, unspecified: Secondary | ICD-10-CM | POA: Diagnosis not present

## 2017-08-20 DIAGNOSIS — J449 Chronic obstructive pulmonary disease, unspecified: Secondary | ICD-10-CM | POA: Diagnosis not present

## 2017-08-20 DIAGNOSIS — J9610 Chronic respiratory failure, unspecified whether with hypoxia or hypercapnia: Secondary | ICD-10-CM | POA: Diagnosis not present

## 2017-08-20 DIAGNOSIS — E119 Type 2 diabetes mellitus without complications: Secondary | ICD-10-CM | POA: Diagnosis not present

## 2017-08-21 ENCOUNTER — Telehealth: Payer: Self-pay | Admitting: Internal Medicine

## 2017-08-21 NOTE — Telephone Encounter (Signed)
Will hold message until paperwork is received.

## 2017-08-25 ENCOUNTER — Telehealth: Payer: Self-pay | Admitting: Internal Medicine

## 2017-08-25 NOTE — Telephone Encounter (Signed)
Copied from [place] [phone]. Topic: Quick Communication - See Telephone Encounter >> Aug 25, 2017 10:42 AM Synthia Innocent wrote: CRM for notification. See Telephone encounter for: 08/25/17 [facility] calling requesting verbal orders, continue PT  1x a week for 2 week

## 2017-08-25 NOTE — Telephone Encounter (Signed)
Spoke with Rachel Moulds to give verbal orders per MD to continue PT.

## 2017-08-25 NOTE — Telephone Encounter (Signed)
Called and spoke with Jerrilynn with [facility] [phone] regarding FYI that she is sending a referral for a scooter for the patient and all paperwork that needs to be completed in next 2-3 weeks.  Routing message to Raquel Sarna to follow up.

## 2017-08-27 ENCOUNTER — Telehealth: Payer: Self-pay | Admitting: Internal Medicine

## 2017-08-27 ENCOUNTER — Other Ambulatory Visit: Payer: Medicare Other | Admitting: *Deleted

## 2017-08-27 ENCOUNTER — Telehealth: Payer: Self-pay

## 2017-08-27 ENCOUNTER — Other Ambulatory Visit: Payer: Medicare Other | Admitting: Licensed Clinical Social Worker

## 2017-08-27 ENCOUNTER — Other Ambulatory Visit: Payer: Self-pay | Admitting: Internal Medicine

## 2017-08-27 DIAGNOSIS — Z515 Encounter for palliative care: Secondary | ICD-10-CM

## 2017-08-27 MED ORDER — DOXYCYCLINE HYCLATE 100 MG PO TABS: 100.0000 mg | ORAL_TABLET | Freq: Two times a day (BID) | ORAL | 0 refills | Status: DC

## 2017-08-27 MED ORDER — PREDNISONE 10 MG PO TABS: ORAL_TABLET | ORAL | 0 refills | Status: DC

## 2017-08-27 NOTE — Progress Notes (Signed)
COMMUNITY PALLIATIVE CARE RN NOTE  PATIENT NAME: [name] DOB: 10-19-1945 MRN: [id number]  PRIMARY CARE PROVIDER: Binnie Rail, MD  RESPONSIBLE PARTY:  Acct ID - Guarantor Home Phone Work Phone Relationship Acct Type  [id number] - Flori,Shatora [phone]  Self P/F     [address], [place], [place] [address]    PLAN OF CARE and INTERVENTION:  1. ADVANCE CARE PLANNING/GOALS OF CARE: Remain in her home, breathe better and avoid hospitalizations 2. PATIENT/CAREGIVER EDUCATION: Reinforced Safety/Fall Precautions, Importance of Energy Conservation 3. DISEASE STATUS: Joint visit made with [place]. Patient called into Palliative Care today requesting visit due to increased shortness of breath. Upon arrival, she is lying down on the couch, however with only just sitting up she started to breathe heavily with use of chest and abdominal accessory muscles in order to breathe. She reports that over the past 3 days that she has been experiencing more instances of dyspnea and taking much longer to recover. Requiring more frequent rest periods between activity. She has been taking her routine medications along with PRN Xanax, which helps some but with exertion, dyspnea will start again. Denies persistent cough or congestion. Increased weakness and fatigue reported. Also taking her Lasix as prescribed and feels that she is voiding adequately. Recommended that she utilize her nebulizers more vs inhalers as they will be easier to inhale. Intake variable from day to day. Contacted Palliative Care NP [name] for recommendations. NP recommended a Prednisone taper. Contacted Pulmonologist (Dr. [name]) to report above noted information. Return call from office stating that MD is placing patient on a course of Doxycyline along with a Prednisone taper starting at 40 mg. Once she gets down to 20 mg of Prednisone, she is to remain on this dose until she returns to his office. Contacted  patient to advise of new orders and that they are calling medications into her pharmacy today. She is appreciative of phone call and interventions. Also contacted NP to provide update of new orders. Will follow up with patient in 2 days to see how she is doing.  HISTORY OF PRESENT ILLNESS: This is a 72 yo female who presents with increased dyspnea, weakness and fatigue, with extended recovery times after exertion. Palliative Care to continue to follow patient. Next visit scheduled in 2 days   CODE STATUS: FULL CODE  ADVANCED DIRECTIVES: N MOST FORM: yes PPS: 40%   PHYSICAL EXAM:   VITALS: Today's Vitals   08/27/17 1237  BP: 120/80  Pulse: 90  Resp: (!) 24  SpO2: 90%  PainSc: 0-No pain    LUNGS: decreased breath sounds, tachypnea CARDIAC: Cor RRR EXTREMITIES: Trace bilateral lower extremity edema SKIN: Exposed skin dry and intact, denies any skin issues  NEURO: Alert and oriented x 3, increased generalized weakness, ambulatory with walker    (Duration of visit and documentation 90 minutes)    Daryl Eastern, RN, BSN

## 2017-08-27 NOTE — Telephone Encounter (Signed)
Spoke with Hospice nurse. She is aware of MR's recs. [facility] [address]   Nothing further needed at time of call.

## 2017-08-27 NOTE — Progress Notes (Signed)
COMMUNITY PALLIATIVE CARE SW NOTE  PATIENT NAME: [name] DOB: 11-14-1945 MRN: [id number]  PRIMARY CARE PROVIDER: Binnie Rail, MD  RESPONSIBLE PARTY:  Acct ID - Guarantor Home Phone Work Phone Relationship Acct Type  [id number] [facility] [phone]  Self P/F     [address], [place], [place] [address]     PLAN OF CARE and INTERVENTIONS:             1. GOALS OF CARE/ ADVANCE CARE PLANNING:  For patient to remain in her apartment.  Patient is a full code. 2. SOCIAL/EMOTIONAL/SPIRITUAL ASSESSMENT/ INTERVENTIONS:  SW and Palliative Care RN, Daryl Eastern, met with patient in her apartment.  She had called [facility] and reported having shortness of breath and requested a visit.  Patient's friend was also present.  RN conducted her assessment and was going to make a consult.  Patient stated she has had episodes of shortness of breath for three days.  SW provided active listening and supportive counseling while she discussed previous hospitalizations. 3. PATIENT/CAREGIVER EDUCATION/ COPING:  SW provided education regarding SW role.  Patient copes by expressing her feelings openly. 4. PERSONAL EMERGENCY PLAN:  Patient keeps a fan close to her to help with breathing.  She has contacted a friend the last two days to help decrease her anxiety by talking with her. 5. COMMUNITY RESOURCES COORDINATION/ HEALTH CARE NAVIGATION:  Patient has caregivers three days per week. 6. FINANCIAL/LEGAL CONCERNS/INTERVENTIONS:  Patient is on a fixed income.     SOCIAL HX:  Social History   Tobacco Use  . Smoking status: Current Every Day Smoker    Packs/day: 0.50    Years: 44.00    Pack years: 22.00    Types: Cigarettes  . Smokeless tobacco: Never Used  . Tobacco comment: 2 cigs per day as of 12/23/16 ep  Substance Use Topics  . Alcohol use: Yes    Alcohol/week: 0.0 oz    Comment: occasional    CODE STATUS:  Full Code  ADVANCED DIRECTIVES: N MOST FORM COMPLETE:  N HOSPICE EDUCATION  PROVIDED: N  PPS:  Patient reports her appetite is normal.  She can ambulate independently for short distances. Duration of visit and documentation:  60 minutes.      Creola Corn Kael Keetch, LCSW

## 2017-08-27 NOTE — Telephone Encounter (Signed)
Allergies  Allergen Reactions  . Ambien [Zolpidem Tartrate] Other (See Comments)    Causes nightmares     Please take Take prednisone 40mg  once daily x 5 days, then 30mg  once daily x 5  days, then 20mg  once daily to continue - I assume she is at baseline 20mg  per day  Take doxycycline 100mg  po twice daily x 5 days; take after meals and avoid sunlight

## 2017-08-27 NOTE — Telephone Encounter (Signed)
Called spoke with patient's Hospice nurse Monishia who reports that:  Pt is having increased SOB with any exertion Sats are okay at 90% at rest on 6lpm No cough or congestion, wheezing, hemoptysis, chest pain, f/c/s, purulent sputum Reports that there really is no air moving Can typically relax to get thru it but the last 3 days has been very difficult - can take 30 minutes to recover Has been taking Xanax and Ventolin HFA  Hospice is asking for prednisone  MR please advise, thank you.

## 2017-08-27 NOTE — Telephone Encounter (Signed)
Received message to call patient as she is having shortness of breath. Spoke with Monishia RN who will go see patient now. Call placed to patient who is in agreement to visit.

## 2017-08-27 NOTE — Telephone Encounter (Signed)
[place] Controlled Substance Database checked. Last filled on 08/01/17

## 2017-08-28 ENCOUNTER — Other Ambulatory Visit: Payer: Medicare Other | Admitting: *Deleted

## 2017-08-28 ENCOUNTER — Telehealth: Payer: Self-pay

## 2017-08-28 ENCOUNTER — Telehealth: Payer: Self-pay | Admitting: Internal Medicine

## 2017-08-28 DIAGNOSIS — I272 Pulmonary hypertension, unspecified: Secondary | ICD-10-CM | POA: Diagnosis not present

## 2017-08-28 DIAGNOSIS — I110 Hypertensive heart disease with heart failure: Secondary | ICD-10-CM | POA: Diagnosis not present

## 2017-08-28 DIAGNOSIS — Z515 Encounter for palliative care: Secondary | ICD-10-CM

## 2017-08-28 DIAGNOSIS — J449 Chronic obstructive pulmonary disease, unspecified: Secondary | ICD-10-CM | POA: Diagnosis not present

## 2017-08-28 DIAGNOSIS — I5032 Chronic diastolic (congestive) heart failure: Secondary | ICD-10-CM | POA: Diagnosis not present

## 2017-08-28 DIAGNOSIS — E119 Type 2 diabetes mellitus without complications: Secondary | ICD-10-CM | POA: Diagnosis not present

## 2017-08-28 DIAGNOSIS — J9610 Chronic respiratory failure, unspecified whether with hypoxia or hypercapnia: Secondary | ICD-10-CM | POA: Diagnosis not present

## 2017-08-28 NOTE — Telephone Encounter (Signed)
Copied from [place] [phone]. Topic: Quick Communication - Rx Refill/Question >> Aug 28, 2017 11:21 AM [name] wrote: [facility] called to ask pcp to give approval for early refill on medication below [name]/c the pt is experiencing COPD Exacerbation; contact pt if needed   Medication: ALPRAZolam Duanne Moron) 0.5 MG tablet [[id number]]    [facility]  Agent: Please be advised that RX refills may take up to 3 business days. We ask that you follow-up with your pharmacy.

## 2017-08-28 NOTE — Telephone Encounter (Signed)
Received phone call from patient who requested visit today as she is not feeling well and continues to be short of breath. Phone call placed to [facility] RN who stated she would be at patient's home in 30 minutes.  Return phone call placed to patient but was only able to leave VM

## 2017-08-29 ENCOUNTER — Other Ambulatory Visit: Payer: Medicare Other | Admitting: Licensed Clinical Social Worker

## 2017-08-29 ENCOUNTER — Telehealth: Payer: Self-pay | Admitting: Internal Medicine

## 2017-08-29 ENCOUNTER — Other Ambulatory Visit: Payer: Medicare Other | Admitting: *Deleted

## 2017-08-29 DIAGNOSIS — Z515 Encounter for palliative care: Secondary | ICD-10-CM

## 2017-08-29 NOTE — Telephone Encounter (Signed)
Spoke with pt to inform RX was sent on 08/27/17 and can be filled on 08/30/17. Pt states she had Flare-up at a couple weeks ago and had to take an extra Xanax a couple days. Advised she should consult with Dr Quay Burow before taking 4 times a day.

## 2017-08-29 NOTE — Telephone Encounter (Signed)
Pt calling to f/up on request.

## 2017-08-29 NOTE — Telephone Encounter (Signed)
Copied from [place] [phone]. Topic: Quick Communication - See Telephone Encounter >> Aug 29, 2017 12:55 PM Bea Graff, NT wrote: CRM for notification. See Telephone encounter for: 08/29/17. [name] with [facility] calling to request orders for speech eval for swallowing strategies with COPD. Also requesting anxiety med refill. CB#: [phone]

## 2017-08-29 NOTE — Telephone Encounter (Signed)
[place] Controlled Substance Database checked. Last filled on 08/01/17

## 2017-08-29 NOTE — Progress Notes (Signed)
COMMUNITY PALLIATIVE CARE RN NOTE  PATIENT NAME: [name] DOB: 1945/04/30 MRN: [id number]  PRIMARY CARE PROVIDER: Binnie Rail, MD  RESPONSIBLE PARTY:  Acct ID - Guarantor Home Phone Work Phone Relationship Acct Type  [id number] - [place],Kevina [phone]  Self P/F     [address], [place], [place] [address]   PLAN OF CARE and INTERVENTION:  1. ADVANCE CARE PLANNING/GOALS OF CARE: Remain in her home, breathe better, avoid hospitalizations 2. PATIENT/CAREGIVER EDUCATION: Reinforced Safety/Fall Precautions, Importance of Energy Conservation 3. DISEASE STATUS: Received a call from [place] stating that patient called reporting difficulties breathing again and requesting RN visit. Upon arrival, patient lying down on the couch. Breathing labored with use of chest accessory muscles. She states that after returning to her living room from using the bathroom, she became dyspneic again and panicked. She did take her Xanax following her call to Palliative Care. Prior to visit, she states that her Oxygen level was 82% on 6L/min via [place] and her Pulse was 133. She was started on a Prednisone taper yesterday along with a 5 day course of Doxycycline. She did take all of her routine medications this am. She began to calm and was able to converse better without difficulties as the visit progressed. She states that she only has 3 tablets of Xanax left and she is unable to pick this medication refill up from the pharmacy until 08/30/17. Contacted Dr. Quay Burow office to request that her Xanax be released tomorrow so she will not run out completely. Recommended that she start to utilize her bedside commode as it seems each time she returns from the bathroom that her shortness of breath worsens. She agreed. Brought commode to living room for her. Also set up her nebulizer machine and recommended that she utilize this more vs the inhaler since inhalation is limited for her at this  time. Oxygen level increased to 90% on 6L and Pulse decreased to 89. Patient even more calm towards end of visit. Will visit again tomorrow to assess overall condition.   HISTORY OF PRESENT ILLNESS:  This is a 72 yo female who resides at home alone. Palliative Care continues to follow. Next visit scheduled for tomorrow.  CODE STATUS: FULL CODE ADVANCED DIRECTIVES: N MOST FORM: yes PPS: 40%   PHYSICAL EXAM:   VITALS: Today's Vitals   08/28/17 1115  BP: 128/84  Pulse: 97  Resp: (!) 22  SpO2: 90%  PainSc: 0-No pain    LUNGS: decreased breath sounds CARDIAC: Cor RRR EXTREMITIES: Trace edema to bilateral lower extremities SKIN: Exposed skin dry and intact  NEURO: Alert and oriented, generalized weakness, ambulatory with walker   (Duration of visit and documentation 110 minutes)    Daryl Eastern, RN, BSN

## 2017-08-29 NOTE — Telephone Encounter (Signed)
Spoke with Cecile Hearing to give verbal orders per MD

## 2017-08-31 NOTE — Telephone Encounter (Signed)
Noted.  rx was sent on 8/7

## 2017-09-02 DIAGNOSIS — J449 Chronic obstructive pulmonary disease, unspecified: Secondary | ICD-10-CM | POA: Diagnosis not present

## 2017-09-02 DIAGNOSIS — I272 Pulmonary hypertension, unspecified: Secondary | ICD-10-CM | POA: Diagnosis not present

## 2017-09-02 DIAGNOSIS — E119 Type 2 diabetes mellitus without complications: Secondary | ICD-10-CM | POA: Diagnosis not present

## 2017-09-02 DIAGNOSIS — I110 Hypertensive heart disease with heart failure: Secondary | ICD-10-CM | POA: Diagnosis not present

## 2017-09-02 DIAGNOSIS — I5032 Chronic diastolic (congestive) heart failure: Secondary | ICD-10-CM | POA: Diagnosis not present

## 2017-09-02 DIAGNOSIS — J9610 Chronic respiratory failure, unspecified whether with hypoxia or hypercapnia: Secondary | ICD-10-CM | POA: Diagnosis not present

## 2017-09-02 NOTE — Progress Notes (Signed)
COMMUNITY PALLIATIVE CARE RN NOTE  PATIENT NAME: Jayleah Garbers DOB: 05/11/1945 MRN: [id number]  PRIMARY CARE PROVIDER: Binnie Rail, MD  RESPONSIBLE PARTY:  Acct ID - Guarantor Home Phone Work Phone Relationship Acct Type  [id number] - [place],Ethyle [phone]  Self P/F     [address], [place], [place] [address]    PLAN OF CARE and INTERVENTION:  1. ADVANCE CARE PLANNING/GOALS OF CARE: Remain in her home, avoid hospitalizations 2. PATIENT/CAREGIVER EDUCATION: Reinforced Safety Precautions, Importance of Energy Conservation  3. DISEASE STATUS: Follow up visit made s/p recent COPD exacerbation. Patient reports feeling much better today. She is sitting up on the couch using her computer. Home health aide present during visit. She is very pleasant and conversational. No dyspnea noted while she is at rest during conversation today. She continues on her Prednisone taper and antibiotics. She has also started using her nebulizers. Bedside commode remains close by in her living room where she spends most of her time in order to conserve energy and help minimize her dyspnea on exertion.  HISTORY OF PRESENT ILLNESS:  This is a 72 yo female who continues to be followed by Palliative Care team. Will continue to monitor overall condition and assist with symptom management needs.  CODE STATUS: FULL CODE ADVANCED DIRECTIVES: N MOST FORM: yes PPS: 40%   PHYSICAL EXAM:   VITALS: Today's Vitals   08/29/17 1424  Pulse: 96  SpO2: 90%  PainSc: 0-No pain    LUNGS: decreased breath sounds CARDIAC: Cor RRR EXTREMITIES: Trace edema to bilateral lower extremities SKIN: Exposed skin dry and intact  NEURO: Alert and oriented x 4, pleasant mood, generalized weakness, ambulatory with walker   (Duration of visit and documentation 45 minutes)    Daryl Eastern, RN, BSN

## 2017-09-02 NOTE — Telephone Encounter (Addendum)
I still have not received anything from [facility] to be completed as of 09/02/17.

## 2017-09-04 DIAGNOSIS — I110 Hypertensive heart disease with heart failure: Secondary | ICD-10-CM | POA: Diagnosis not present

## 2017-09-04 DIAGNOSIS — J449 Chronic obstructive pulmonary disease, unspecified: Secondary | ICD-10-CM | POA: Diagnosis not present

## 2017-09-04 DIAGNOSIS — E119 Type 2 diabetes mellitus without complications: Secondary | ICD-10-CM | POA: Diagnosis not present

## 2017-09-04 DIAGNOSIS — J9610 Chronic respiratory failure, unspecified whether with hypoxia or hypercapnia: Secondary | ICD-10-CM | POA: Diagnosis not present

## 2017-09-04 DIAGNOSIS — I5032 Chronic diastolic (congestive) heart failure: Secondary | ICD-10-CM | POA: Diagnosis not present

## 2017-09-04 DIAGNOSIS — I272 Pulmonary hypertension, unspecified: Secondary | ICD-10-CM | POA: Diagnosis not present

## 2017-09-18 NOTE — Telephone Encounter (Signed)
Left message with Jacqualyn Posey, made her aware MD [name] has agreed to the referral for a motor scooter, however no paperwork has been received. Awaiting call back regarding paper work.

## 2017-09-18 NOTE — Telephone Encounter (Signed)
Spoke with [name] at [facility], states that nothing is on file for pt to be receiving a motor scooter.  All on her file is her supplemental O2.  [name] checked with the rehab team who verified this.    MR we need to place a new order for a motor scooter- ok to order?  Thanks!

## 2017-09-18 NOTE — Telephone Encounter (Signed)
Ok to for scooter. I think it will help her a lot given her high o2 needs and disability. I can support

## 2017-09-21 ENCOUNTER — Other Ambulatory Visit: Payer: Self-pay | Admitting: Internal Medicine

## 2017-09-22 ENCOUNTER — Other Ambulatory Visit: Payer: Self-pay | Admitting: Internal Medicine

## 2017-09-23 ENCOUNTER — Other Ambulatory Visit: Payer: Self-pay | Admitting: Internal Medicine

## 2017-09-23 NOTE — Telephone Encounter (Signed)
Called patient, unable to reach left message to give us a call back. 

## 2017-09-24 NOTE — Telephone Encounter (Signed)
Called patient x2 unable to reach. Left message to give [place] a call back.

## 2017-09-25 NOTE — Telephone Encounter (Signed)
lmtcb x3 for pt. 

## 2017-09-30 ENCOUNTER — Other Ambulatory Visit: Payer: Self-pay | Admitting: Internal Medicine

## 2017-09-30 NOTE — Telephone Encounter (Signed)
Last refill was 08/30/17 Last OV was 611/19 Next OV is 12/31/17

## 2017-10-03 ENCOUNTER — Other Ambulatory Visit: Payer: Medicare Other | Admitting: *Deleted

## 2017-10-03 DIAGNOSIS — Z515 Encounter for palliative care: Secondary | ICD-10-CM

## 2017-10-06 ENCOUNTER — Telehealth: Payer: Self-pay | Admitting: Internal Medicine

## 2017-10-06 NOTE — Progress Notes (Signed)
COMMUNITY PALLIATIVE CARE RN NOTE  PATIENT NAME: [name] DOB: Mar 21, 1945 MRN: [id number]  PRIMARY CARE PROVIDER: Binnie Rail, MD  RESPONSIBLE PARTY:  Acct ID - Guarantor Home Phone Work Phone Relationship Acct Type  [id number] - [name],Toni [phone]  Self P/F     [address], [place], [place] [address]    PLAN OF CARE and INTERVENTION:  1. ADVANCE CARE PLANNING/GOALS OF CARE: She wants to remain in her home for as long as possible and avoid hospitalizations 2. PATIENT/CAREGIVER EDUCATION: Reinforced Safe Mobility/Transfers, Anxiety/Dyspnea Management 3. DISEASE STATUS: Patient sitting on the couch in her living room awake and alert upon arrival. She denies pain. She reports increased dyspnea, but feels that it is more anxiety related due to her having an issue with bed bugs. She is concerned about the chemical treatment that is going to be used next week to treat this issue, as she was told that she has to be out of her apartment for 4-5 hours and is unsure as to where she will go. She also has been without her home health aide for several weeks d/t bed bugs. They will be able to return once a certificate is given stating that her apartment is free of pests. She has to be reminded to take her Xanax, and is effective when needed/taken. She states that her landlord also has to have her apartment painted and new carpet installed and she is concerned where she can go during this time as well. Spoke to our Palliative Care SW to look into patient options. Will continue to monitor.  HISTORY OF PRESENT ILLNESS:  This is a 72 yo female who resides in her home alone. Palliative Care Team continues to follow.  CODE STATUS: FULL CODE ADVANCED DIRECTIVES: N MOST FORM: yes PPS: 40%   PHYSICAL EXAM:   LUNGS: decreased breath sounds CARDIAC: Cor RRR EXTREMITIES: Trace edema bilateral lower extremities SKIN: Exposed skin dry and intact  NEURO: Alert and oriented x 3, generalized  weakness, ambulatory with walker   (Duration of visit and documentation 45 minutes)    Toni Eastern, RN, BSN

## 2017-10-06 NOTE — Telephone Encounter (Signed)
If the bed bug was caused by the landlord then the reason they are making her sign is that they are trying to protect themselves. Can she not sign but leave the house for 2-4 days . I think if she was out of the house for 2-4 days just before and after spraying she should be ok  Thanks    SIGNATURE    Dr. Brand Males, M.D., F.C.C.P,  Pulmonary and Critical Care Medicine Staff Physician, [place] Director - Interstitial Lung Disease  Program  Pulmonary [place] at [place], [place], [address]  Pager: [phone], If no answer or between  15:00h - 7:00h: call [phone] Telephone: [phone]  5:40 PM 10/06/2017

## 2017-10-06 NOTE — Telephone Encounter (Signed)
lmtcb x1 for pt. 

## 2017-10-06 NOTE — Telephone Encounter (Signed)
Spoke with patient, patient reports her landlord is requesting that she sign a letter stating the landlord will not be held responsible if the patient get's sick, goes to ER, or dies due to inhaling the chemicals that are required due to the patient having bed bugs. [place] are going to treat the mattress and clean the carpet. Patient would like MR recommendations as to how long she should stay out of the apartment due to the chemical fumes and whether he thinks she should sign the letter or not.   MR please advise?

## 2017-10-06 NOTE — Telephone Encounter (Signed)
Patient returned call, CB is [phone]. °

## 2017-10-06 NOTE — Telephone Encounter (Signed)
Called and spoke with patient, advised her of MR response. She is aware and verbalized understanding. Nothing further needed.

## 2017-10-08 ENCOUNTER — Ambulatory Visit: Payer: Self-pay | Admitting: *Deleted

## 2017-10-08 NOTE — Telephone Encounter (Signed)
Pt calling office stating that she fell on her left elbow this morning after her foot was tangled in some cords. Pt states she does not experience pain in the arm until she tries to reach for something or if she bends her arm upwards she feels a "twinge of pain". Pt states she is able to move her fingers, lift her arm over her shoulder and can reach across her head. Pt denies any numbness or tingling to the arm or fingers.Pt sates when her arm is still she does not feel any pain but when she bends her arm pain is 4-5. Pt offered to make appt to be evaluated but pt just wants home care advice at this time. Pt states she was advised not to take ibuprofen and wanted to know what she could take for pain. Pt advised that she could use tylenol and ice to area of pain. Pt advised that if symptoms become worse to return call to the office for an appt. Pt verbalized understanding.  Reason for Disposition . Minor injury or bruising from direct blow  Answer Assessment - Initial Assessment Questions 1. MECHANISM: "How did the injury happen?"     Foot was tangled in some cords and fell on left elbow 2. ONSET: "When did the injury happen?" (Minutes or hours ago)      This morning 3. LOCATION: "Where is the injury located?"      Left elbow 4. APPEARANCE of INJURY: "What does the injury look like?"      Arms looks normal 5. SEVERITY: "Can you use the arm normally?"      yes 6. SWELLING or BRUISING: "is there any swelling or bruising?" If so, ask: "How large is it? (e.g., inches, centimeters)      No 7. PAIN: "Is there pain?" If so, ask: "How bad is the pain?"    (Scale 1-10; or mild, moderate, severe)     When arm is still no pain, but with bending arm upwards at the elbow pt rates pain at 4-5 8. TETANUS: For any breaks in the skin, ask: "When was the last tetanus booster?"     No breaks in the skin 9. OTHER SYMPTOMS: "Do you have any other symptoms?"  (e.g., numbness in hand)     no 10. PREGNANCY: "Is there  any chance you are pregnant?" "When was your last menstrual period?"       n/a  Protocols used: ARM INJURY-A-AH

## 2017-10-09 ENCOUNTER — Other Ambulatory Visit: Payer: Self-pay | Admitting: Internal Medicine

## 2017-10-09 ENCOUNTER — Telehealth: Payer: Self-pay | Admitting: Licensed Clinical Social Worker

## 2017-10-09 ENCOUNTER — Ambulatory Visit: Payer: Medicare Other | Admitting: Gastroenterology

## 2017-10-09 DIAGNOSIS — E119 Type 2 diabetes mellitus without complications: Secondary | ICD-10-CM

## 2017-10-09 DIAGNOSIS — J441 Chronic obstructive pulmonary disease with (acute) exacerbation: Secondary | ICD-10-CM

## 2017-10-09 DIAGNOSIS — Z794 Long term (current) use of insulin: Secondary | ICD-10-CM

## 2017-10-09 DIAGNOSIS — J9611 Chronic respiratory failure with hypoxia: Secondary | ICD-10-CM

## 2017-10-09 DIAGNOSIS — J9612 Chronic respiratory failure with hypercapnia: Principal | ICD-10-CM

## 2017-10-09 NOTE — Telephone Encounter (Signed)
Palliative Care SW left a vm indicating alternative housing has not been obtained for the next few days.

## 2017-10-09 NOTE — Telephone Encounter (Signed)
Palliative Care SW left a vm with patient informing her that SW has explored resources for temporary housing and have not been able to find any.

## 2017-10-13 ENCOUNTER — Telehealth: Payer: Self-pay | Admitting: Internal Medicine

## 2017-10-13 MED ORDER — DOXYCYCLINE HYCLATE 100 MG PO TABS: 100.0000 mg | ORAL_TABLET | Freq: Two times a day (BID) | ORAL | 0 refills | Status: DC

## 2017-10-13 MED ORDER — PREDNISONE 10 MG PO TABS: ORAL_TABLET | ORAL | 0 refills | Status: DC

## 2017-10-13 NOTE — Telephone Encounter (Signed)
Spoke with patient. She is aware of MR's recs. Will go ahead and call in [facility] to [facility] [address].   Nothing further needed at time of call.

## 2017-10-13 NOTE — Telephone Encounter (Signed)
Pt c/o increased sob, fatigue, chest congestion, sinus congestion, headache, prod cough with light yellow mucus.  S/s present X2 days. Has not taken anything to help s/s.  Requesting recs. Has appt on Thursday but feels that she cannot wait until then for intervention.  [facility] [address].  MR please advise on recs.  Thanks!

## 2017-10-13 NOTE — Telephone Encounter (Signed)
Please take Take prednisone 40mg  once daily x 3 days, then 30mg  once daily x 3 days, then 20mg  once daily x 3 days, then prednisone 10mg  once daily to continue which I think is her baseline dose   Take doxycycline 100mg  po twice daily x 5 days; take after meals and avoid sunlight   Allergies  Allergen Reactions  . Ambien [Zolpidem Tartrate] Other (See Comments)    Causes nightmares

## 2017-10-14 ENCOUNTER — Ambulatory Visit: Payer: Medicare Other | Admitting: Internal Medicine

## 2017-10-16 ENCOUNTER — Ambulatory Visit: Payer: Medicare Other | Admitting: Internal Medicine

## 2017-10-20 ENCOUNTER — Ambulatory Visit: Payer: Medicare Other | Admitting: Internal Medicine

## 2017-10-20 NOTE — Progress Notes (Deleted)
PCP Binnie Rail, MD   HPI   OV 06/06/2016   Chief Complaint  Patient presents with  . Acute Visit    increased SOB x1 week, tightness in chest, slight wheezing, weakness/fatigue, prod cough with beige mucus in the mornings.  denies any f/c/s, hemoptysis,     [name] 72 female with chronic hypoxemic respiratory failure chronically on prednisone. In the past she had mediastinal adenopathy did seem to be improving. The base of chronic hypoxemic respiratory failure COPD/bronchiectasis. She's currently on 4-5 liters of oxygen. Then in February 2018 had an admission for COPD exacerbation. Although CT scan of chest at that time did show consolidation but she is not aware of pneumonia diagnosis. Since then she's been on 6 L oxygen. She feels baseline prednisone dose is 20 mg below which she gets symptomatic. She ran out of her prednisone last week and apparently only 10 mg was called in. After that she started noticing more symptoms with fatigue shortness of breath. Is also change in cough with slight change in discoloration of sputum. When she walked and she needed 8 L of oxygen to correct the hypoxemia according to the intake nurse. However she denies that she is sick that she needs hospitalization. She is adamantly refusing hospitalization. She does have slight worsening in her pedal edema compared to baseline. There is no DVT symptoms otherwise.     OV 06/19/2016  Chief Complaint  Patient presents with  . Follow-up    Pt states her SOB has slightly improved since last OV but is not back to baseline. Pt c/o prod cough with cloudy mucus - pt states it is worse at night and in the morning.    Follow-up chronic hypoxic respiratory failure chronically on prednisone. Saw her in 06/06/2016 during an acute exacerbation. She declined admission. We had treated with outpatient antibiotic and prednisone. She feels 20 mg of prednisone is the lowest she can go. Review of the chart actually  shows she has chronic hypercapnic respiratory failure as well. She still feels fatigued. She's not using nocturnal ventilation. Currently in the office today although she feels better she didn't desaturate to 80% while ambulating on 4 L of oxygen. Most recent chest x-ray and labs were all okay. This no sputum or fever at this point in time.  OV 12/23/2016  Chief Complaint  Patient presents with  . Follow-up    Pt developed a head cold x1 week ago which went to her chest. Had been on z pak and prednisone and when finished meds, symptoms started all over again. Pt became SOB when waiting for [employer] bus and driver called EMS due to SOB and sats being low. Sats had gone back to 90s by the time EMS arrived.     S: Baseline cOPD and chronic resp failure with CAT score 27 as below. Uses 4L O2 and is prednisone depdentn at 20mg  per day. Recent flareup and we tapered her prednisone just for the first 25 mg and she is feeling awful. She'll go back to 20 mg prednisone per day starting tomorrow. She is worried that she's having always prednisone withdrawal symptoms but understands that this is her. She did have a CT chest that shows stability in her mediastinal adenopathy. She does feel oxygen level is low but this is baseline. She wants to try home physical therapy again. She wants an inhaler change. She wants larger portable oxygen system. She also wants to have a better Medicare payment plan  that will allow her to be seen in this practice     OV 10/20/2017  Subjective:  Patient ID: [name], female , DOB: 1945-06-27 , age 72 y.o. , MRN: [id number] , ADDRESS: [address] [place] [address]   10/20/2017 -  No chief complaint on file.    HPI [name] 46 y.o. -    CAT COPD Symptom & Quality of Life Score ([place] trademark) 0 is no burden. 5 is highest burden 12/23/2016   Never Cough -> Cough all the time 3  No phlegm in chest -> Chest is full of phlegm 4  No chest tightness -> Chest  feels very tight 0  No dyspnea for 1 flight stairs/hill -> Very dyspneic for 1 flight of stairs 5  No limitations for ADL at home -> Very limited with ADL at home 4  Confident leaving home -> Not at all confident leaving home 3  Sleep soundly -> Do not sleep soundly because of lung condition 3  Lots of Energy -> No energy at all 5  TOTAL Score (max 40)  27     ROS - per HPI     has a past medical history of Arrhythmia, Bronchiectasis (march 2012), CHF (congestive heart failure) ([place]), Chicken pox, Chronic diastolic heart failure ([place]), Chronic respiratory failure ([place]), COPD (chronic obstructive pulmonary disease) ([place]), Generalized anxiety disorder, History of cervical cancer (1982), Hyperlipidemia, Hypertension, Leg swelling, 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   reports that she has been smoking cigarettes. She has a 22.00 pack-year smoking history. She has never used smokeless tobacco.  Past Surgical History:  Procedure Laterality Date  . TOTAL ABDOMINAL HYSTERECTOMY      Allergies  Allergen Reactions  . Ambien [Zolpidem Tartrate] Other (See Comments)    Causes nightmares    Immunization History  Administered Date(s) Administered  . Influenza Split 11/12/2010  . Influenza, High Dose Seasonal PF 11/09/2012, 10/31/2015  . Influenza,inj,Quad PF,6+ Mos 10/20/2014  . Pneumococcal Conjugate-13 05/17/2013  . Pneumococcal Polysaccharide-23 03/22/2010, 06/24/2016    Family History  Problem Relation Age of Onset  . Other Father        MVA  . Esophageal cancer Mother      Current Outpatient Medications:  .  albuterol (PROVENTIL HFA;VENTOLIN HFA) 108 (90 Base) MCG/ACT inhaler, Inhale 2 puffs into the lungs every 6 (six) hours as needed for wheezing or shortness of breath., Disp: 1 Inhaler, Rfl: 5 .  ALPRAZolam (XANAX) 0.5 MG  tablet, TAKE 1 TABLET BY MOUTH THREE TIMES A DAY AS NEEDED FOR ANXIETY (MAY FILL 08/30/17), Disp: 90 tablet, Rfl: 0 .  doxycycline (VIBRA-TABS) 100 MG tablet, Take 1 tablet (100 mg total) by mouth 2 (two) times daily., Disp: 10 tablet, Rfl: 0 .  doxycycline (VIBRA-TABS) 100 MG tablet, Take 1 tablet (100 mg total) by mouth 2 (two) times daily., Disp: 10 tablet, Rfl: 0 .  fluticasone (FLONASE) 50 MCG/ACT nasal spray, USE 2 SPRAYS IN EACH NOSTRIL EVERY DAY AS NEEDED FOR ALLERGIES AND RHINITIS, Disp: 48 g, Rfl: 1 .  furosemide (LASIX) 20 MG tablet, Take 3 tablets (60 mg total) by mouth 2 (two) times daily., Disp: 270 tablet, Rfl: 0 .  GLUCOCOM LANCETS 33G MISC, Use with Test Strips to take blood sugars, Disp: 100 each, Rfl: 5 .  glucose blood (BAYER CONTOUR NEXT TEST) test strip, Use to take blood sugars twice  daily., Disp: 100 each, Rfl: 3 .  Insulin Glargine (LANTUS SOLOSTAR) 100 UNIT/ML Solostar Pen, Inject 32 Units into the skin daily at 10 pm. -- Office visit needed for further refills, Disp: 5 pen, Rfl: 0 .  Insulin Pen Needle (BD PEN NEEDLE NANO U/F) 32G X 4 MM MISC, USE TO ADMINISTER LANTUS INSULIN, Disp: 100 each, Rfl: 3 .  ipratropium-albuterol (DUONEB) 0.5-2.5 (3) MG/3ML SOLN, Take 3 mLs by nebulization every 6 (six) hours as needed., Disp: 360 mL, Rfl: 5 .  nystatin (MYCOSTATIN) 100000 UNIT/ML suspension, Take 5 mLs (500,000 Units total) by mouth 4 (four) times daily., Disp: 200 mL, Rfl: 0 .  pantoprazole (PROTONIX) 40 MG tablet, Take 1 tablet (40 mg total) by mouth daily., Disp: 30 tablet, Rfl: 0 .  PARoxetine (PAXIL) 40 MG tablet, Take 1 tablet (40 mg total) by mouth every morning., Disp: 90 tablet, Rfl: 1 .  predniSONE (DELTASONE) 10 MG tablet, 40mg x3day,30mg x3day,20mg continue daily (Patient taking differently: Take 20 mg by mouth daily. ), Disp: 180 tablet, Rfl: 0 .  predniSONE (DELTASONE) 10 MG tablet, 20mg  (2 tablets) daily, Disp: 90 tablet, Rfl: 0 .  predniSONE (DELTASONE) 10 MG tablet,  Take 4 tabs x3days, 3 tabs x3days, 2 tabs x2days and then 1 tablet daily., Disp: 60 tablet, Rfl: 0 .  TRELEGY ELLIPTA 100-62.5-25 MCG/INH AEPB, TAKE 1 PUFF BY MOUTH EVERY DAY, Disp: 180 each, Rfl: 1 .  triamcinolone cream (KENALOG) 0.1 %, Apply 1 application topically 2 (two) times daily., Disp: 30 g, Rfl: 0      Objective:   There were no vitals filed for this visit.  Estimated body mass index is 38.11 kg/m as calculated from the following:   Height as of 12/23/16: 5\' 4"  (1.626 m).   Weight as of 07/01/17: 222 lb (100.7 kg).  @WEIGHTCHANGE @  There were no vitals filed for this visit.   Physical Exam  General Appearance:    Alert, cooperative, no distress, appears stated age - *** , Deconditioned looking - *** , OBESE  - ***, Sitting on Wheelchair -  ***  Head:    Normocephalic, without obvious abnormality, atraumatic  Eyes:    PERRL, conjunctiva/corneas clear,  Ears:    Normal TM's and external ear canals, both ears  Nose:   Nares normal, septum midline, mucosa normal, no drainage    or sinus tenderness. OXYGEN ON  - *** . Patient is @ ***   Throat:   Lips, mucosa, and tongue normal; teeth and gums normal. Cyanosis on lips - ***  Neck:   Supple, symmetrical, trachea midline, no adenopathy;    thyroid:  no enlargement/tenderness/nodules; no carotid   bruit or JVD  Back:     Symmetric, no curvature, ROM normal, no CVA tenderness  Lungs:     Distress - *** , Wheeze ***, Barrell Chest - ***, Purse lip breathing - ***, Crackles - ***   Chest Wall:    No tenderness or deformity.    Heart:    Regular rate and rhythm, S1 and S2 normal, no rub   or gallop, Murmur - ***  Breast Exam:    NOT DONE  Abdomen:     Soft, non-tender, bowel sounds active all four quadrants,    no masses, no organomegaly. Visceral obesity - ***  Genitalia:   NOT DONE  Rectal:   NOT DONE  Extremities:   Extremities - normal, Has Cane - ***, Clubbing - ***, Edema - ***  Pulses:   2+ and symmetric  all extremities    Skin:   Stigmata of Connective Tissue Disease - ***  Lymph nodes:   Cervical, supraclavicular, and axillary nodes normal  Psychiatric:  Neurologic:   Pleasant - ***, Anxious - ***, Flat affect - ***  CAm-ICU - neg, Alert and Oriented x 3 - yes, Moves all 4s - yes, Speech - normal, Cognition - intact           Assessment:     No diagnosis found.     Plan:     There are no Patient Instructions on file for this visit.    SIGNATURE    Dr. Brand Males, M.D., F.C.C.P,  Pulmonary and Critical Care Medicine Staff Physician, [place] Director - Interstitial Lung Disease  Program  Pulmonary [place] at [place], [place], [address]  Pager: [phone], If no answer or between  15:00h - 7:00h: call [phone] Telephone: [phone]  12:21 AM 10/20/2017

## 2017-10-21 ENCOUNTER — Other Ambulatory Visit: Payer: Self-pay

## 2017-10-21 NOTE — Patient Outreach (Addendum)
[facility]) Care Management  10/21/2017  M[place] Junio [birth year] [id number]  TELEPHONE SCREENING Referral date: 10/10/17 Referral source: primary MD  Referral reason: social work needs Insurance: Medicare Attempt #1  Telephone call to patient regarding primary MD referral. RNCM attempted to verify HIPAA with patient. Patient would not verify and hung up phone.   RNCM contacted Dr. Quay Burow office and spoke with Larene Beach.  Informed Larene Beach patient would not engage RNCM by phone.  RNCM requested primary MD office to assist with engaging patient to [place] management services.   PLAN; RNCM will attempt 2nd telephone outreach to patient within 4 business days.  RNCM will send outreach letter to patient.   Quinn Plowman RN,BSN,CCM [facility] Telephonic  [phone]

## 2017-10-24 ENCOUNTER — Other Ambulatory Visit: Payer: Self-pay

## 2017-10-24 NOTE — Patient Outreach (Signed)
[place] [facility]) Care Management  10/24/2017  [name] 1946-01-12 [id number]  TELEPHONE SCREENING Referral date: 10/10/17 Referral source: primary MD  Referral reason: social work needs Insurance: Medicare Attempt #2  Telephone call to patient regarding primary MD referral. Unable to reach. HIPAA compliant voice message left with call back phone number  PLAN; RNCM will attempt 3rd  telephone outreach to patient within 4 business days.    Quinn Plowman RN,BSN,CCM [facility] Telephonic  [phone]

## 2017-10-28 ENCOUNTER — Ambulatory Visit: Payer: Medicare Other | Admitting: Internal Medicine

## 2017-10-29 ENCOUNTER — Other Ambulatory Visit: Payer: Self-pay | Admitting: Internal Medicine

## 2017-10-29 ENCOUNTER — Telehealth: Payer: Self-pay | Admitting: Internal Medicine

## 2017-10-29 ENCOUNTER — Other Ambulatory Visit: Payer: Self-pay

## 2017-10-29 NOTE — Telephone Encounter (Signed)
Copied from [place] [id number]. Topic: Inquiry >> Oct 29, 2017  8:42 AM Oliver Pila B wrote: Reason for CRM: [facility] called about the referral received; pt would not engage or cooperate w/ nurse; they are asking to help them w/ pt to receive services; contact Quinn Plowman @ [phone]

## 2017-10-29 NOTE — Patient Outreach (Addendum)
[facility]) Care Management  10/29/2017  Annalei Friesz [birth year] [id number]  TELEPHONE SCREENING Referral date:10/10/17 Referral source:primary MD Referral reason:social work needs Insurance:Medicare Attempt #3  Telephone call to patient regarding primary MD referral. Unable to reach. HIPAA compliant voice message left with call back phone number  RNCM called primary MD office.  Spoke with Lanae Boast.  Electronic message left for Dr. Quay Burow nurse requesting assistance with engaging patient to [facility] care management services.   PLAN; RNCM will proceed with closure if no response   Quinn Plowman RN,BSN,CCM [facility] Telephonic  [phone]

## 2017-10-29 NOTE — Telephone Encounter (Signed)
Last OV 07/01/17 Next OV 12/31/17  [place] Controlled Substance Database checked. Last filled on 10/01/2017

## 2017-10-31 ENCOUNTER — Ambulatory Visit: Payer: Medicare Other | Admitting: Internal Medicine

## 2017-10-31 NOTE — Telephone Encounter (Signed)
Called pt to see if she was still interested in the referral to [facility] and she stated that she was. She said that the [facility] nurse probably did not explain who they were. LVM on [place]'s phone and advised to try again with contacting pt.

## 2017-11-02 ENCOUNTER — Other Ambulatory Visit: Payer: Self-pay | Admitting: Internal Medicine

## 2017-11-03 ENCOUNTER — Other Ambulatory Visit: Payer: Self-pay

## 2017-11-03 NOTE — Patient Outreach (Signed)
[facility]) Care Management  11/03/2017  [name] 12-18-1945 [id number]  TELEPHONE SCREENING Referral date:10/10/17 Referral source:primary MD Referral reason:social work needs Insurance:Medicare  Telephone call to patient regarding primary MD referral. HIPAA verified. Explained reason for call.  Patient states she  needs to relocate from her apartment due to extermination process for bed bug infestation. Patient states her apartment has been exterminated and now her apartment has to be painted and the rug either removed or shampooed. Patient states she has COPD which can be aggravated by this process. Patient states she needs to be out of her apartment approximately 2 days for this to be done.  She states she has asked her family for a temporary place to say but they have refused due to the history of bed bugs.  Patient states she has an aid through [employer] 2 x per week. Patient states the aid has been placed on hold until extermination process is completed. Patient states her aid cooks/ cleans for her. She states she is unable to do this for herself. She reports receiving meals on wheels daily. She has been recommended to food banks but states she is unable to ride the [employer] bus and lift items.  She also states she has anxiety attack when attempting to ride the [employer] bus on occasion.  Patient reports she is unable to stand heat or cold and/ or exertion due to her COPD. She reports being on 5 L of Q2 around the clock. Patient states she is being followed by palliative  Nursing and social work.  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  Patient states she belongs to senior resources.  Patient reports falling approximately 2 weeks and hitting her left elbow. She states she tripped over her oxygen cord.  Patient denies seeing a doctor after fall. Patient states she has noticed some weakness in her left arm since the fall.  Patient states she is scheduled to see her  pulmonologist on 11/06/17 and has a follow up with an ophthalmologist on 12/09/17.  RNCM discussed and offered [facility] care management services. Patient agreed to follow up from the social worker and Engineer, maintenance.   PLAN: RNCM will refer patient to [facility] social worker and Engineer, maintenance.    Quinn Plowman RN,BSN,CCM [facility] Telephonic  [phone]

## 2017-11-05 ENCOUNTER — Telehealth: Payer: Self-pay | Admitting: Licensed Clinical Social Worker

## 2017-11-05 NOTE — Telephone Encounter (Signed)
Palliative Care SW spoke with patient and scheduled a home visit for Friday, 10/25, at 11:30am.

## 2017-11-06 ENCOUNTER — Ambulatory Visit: Payer: Medicare Other | Admitting: Internal Medicine

## 2017-11-07 ENCOUNTER — Telehealth: Payer: Self-pay | Admitting: Internal Medicine

## 2017-11-07 NOTE — Telephone Encounter (Signed)
Patient is needing to have an OV and also pt needs to requalify for her O2.  Pt has cancelled 6 follow up appointments with MR from 9/24-10/17  Called and spoke with pt to see if I could get another follow up appt scheduled for her and I stated to pt that we are needing to requalify her for her O2 per insurance recs.  Pt expressed understanding. I rescheduled pt's appt Wednesday, 10/30 at 12pm and stated to pt that she needs to keep this appt due to needing to be requalified for her O2. Nothing further needed.

## 2017-11-10 ENCOUNTER — Other Ambulatory Visit: Payer: Self-pay

## 2017-11-10 NOTE — Patient Outreach (Signed)
[facility]) Care Management  11/10/2017  Melea Prezioso Mar 30, 1945 [id number]  Successful outreach to the patient on today's date, HIPAA identifiers confirmed. BSW introduced self to the patient and the reason for today's call, indicating the patient had been referred to this BSW for assistance with completing an advance directive as well as temporary housing resources. The patient indicates she is interested in completing an advance directive.   Advance Directive:BSW briefly explained the importance of this document and how to complete. BSW to mail the patient an advance directive packet as well as an EMMI educational handout explaining this document further. BSW also took the opportunity to discuss a MOST and DNR form. BSW to mail these forms as well for the patient to review. The patient understands these forms must be completed with her physician. The patient further understands these forms are not necessary in order to complete an advance directive.  Housing: The patient reports she previously had a bed bug infestation that her landlord did not want to treat. The patient contacted the [place] who inspected the patients apartment. Several violations were identified. Since inspection was performed, the landlord has treated the patients apartment for bedbugs as well as resolved other concerns. The patient reports the last two items the city has asked of the landlord is to paint the patients apartment and clean/replace carpet due to the patient residing in the apartment for 10 years and never having this completed.  The patient has COPD and states the landlord has told her she would need to leave the apartment for a minimum of two days. The patient reports the landlord only plans to paint the patients living room, kitchen, and bathroom as other rooms "are not that bad". The patient indicates she has asked the landlord to purchase no odor paint so she will be able to remain in  the home. The patient also states the landlord wants her vacated for a minimum of two days to allow the carpet to fully dry.   The patient reports her landlord has refused to pay for her to stay at a separate location. The patient reports she feels her landlord is not being fair. The patient has no contacted the city worker to inquire her rights to receiving assistance with housing from her landlord. BSW encouraged the patient to contact her city worker to assist with housing needs. The patient stated understanding. The patient also stated "I told them to come next weekend and I'll have to get a hotel room" when disclosing when painters would begin work.  During today's call the patient is able to confirm her CNA from [facility] is active. The patient reports she has a CNA assist on Tuesday and Friday for 4 hours each day. This service was re-instated last week. The patient also reports being active with mobile meals. No other social work needs have been identified by the patient.  Plan: BSW to contact the patient in the next two weeks to confirm receipt of advance directive packet and assist with completion as needed. The patient has this BSW's contact information and is aware she may call if needs arise prior to the next scheduled call. BSW to route this note to the patient primary physician for communication purposes.  Daneen Schick, BSW, CDP [facility] [phone]

## 2017-11-11 ENCOUNTER — Encounter: Payer: Self-pay | Admitting: *Deleted

## 2017-11-14 ENCOUNTER — Other Ambulatory Visit: Payer: Medicare Other | Admitting: Licensed Clinical Social Worker

## 2017-11-17 ENCOUNTER — Other Ambulatory Visit: Payer: Medicare Other | Admitting: *Deleted

## 2017-11-17 ENCOUNTER — Other Ambulatory Visit: Payer: Medicare Other | Admitting: Licensed Clinical Social Worker

## 2017-11-17 DIAGNOSIS — Z515 Encounter for palliative care: Secondary | ICD-10-CM

## 2017-11-18 NOTE — Progress Notes (Signed)
COMMUNITY PALLIATIVE CARE SW NOTE  PATIENT NAME: [name] DOB: December 16, 1945 MRN: [id number]  PRIMARY CARE PROVIDER: Binnie Rail, MD  RESPONSIBLE PARTY:  Acct ID - Guarantor Home Phone Work Phone Relationship Acct Type  [id number] [facility] [phone]  Self P/F     [address], [place],  [address]     PLAN OF CARE and INTERVENTIONS:             1. GOALS OF CARE/ ADVANCE CARE PLANNING:  Patient wishes to remain in her apartment.  Patient is a full code. 2. SOCIAL/EMOTIONAL/SPIRITUAL ASSESSMENT/ INTERVENTIONS:  SW and Palliative Care RN, Daryl Eastern, met with patient in her apartment.  She denied pain.  Her apartment was treated for bedbugs.  She is waiting to purchase furniture until her apartment is painted and the carpets are cleaned.  She appeared to respond positively to life review.  She was alert and oriented, but it was difficult to follow her story at times. 3. PATIENT/CAREGIVER EDUCATION/ COPING:  Patient copes by smoking.  She expresses her feelings openly. 4. PERSONAL EMERGENCY PLAN:  Patient contacts Palliative Care RN or her MD as needed. 5. COMMUNITY RESOURCES COORDINATION/ HEALTH CARE NAVIGATION:  She has a CNA for a few hours three days per week. 6. FINANCIAL/LEGAL CONCERNS/INTERVENTIONS:  Patient is on a fixed income.     SOCIAL HX:  Social History   Tobacco Use  . Smoking status: Current Every Day Smoker    Packs/day: 0.50    Years: 44.00    Pack years: 22.00    Types: Cigarettes  . Smokeless tobacco: Never Used  . Tobacco comment: 2 cigs per day as of 12/23/16 ep  Substance Use Topics  . Alcohol use: Yes    Alcohol/week: 0.0 standard drinks    Comment: occasional    CODE STATUS:  Full Code  ADVANCED DIRECTIVES: N MOST FORM COMPLETE:  Y HOSPICE EDUCATION PROVIDED: N PPS:  Patient reports her appetite is normal.  She is able to stand and ambulate independently for short distances in her apartment. Duration of visit and documentation:   75 minutes.      Creola Corn Cyniah Gossard, LCSW

## 2017-11-19 ENCOUNTER — Ambulatory Visit: Payer: Medicare Other | Admitting: Internal Medicine

## 2017-11-20 ENCOUNTER — Other Ambulatory Visit: Payer: Self-pay

## 2017-11-20 ENCOUNTER — Ambulatory Visit (INDEPENDENT_AMBULATORY_CARE_PROVIDER_SITE_OTHER): Payer: Medicare Other | Admitting: Internal Medicine

## 2017-11-20 ENCOUNTER — Encounter: Payer: Self-pay | Admitting: Internal Medicine

## 2017-11-20 VITALS — BP 94/60 | HR 109 | Ht 64.0 in | Wt 212.4 lb

## 2017-11-20 DIAGNOSIS — J9611 Chronic respiratory failure with hypoxia: Secondary | ICD-10-CM

## 2017-11-20 DIAGNOSIS — J449 Chronic obstructive pulmonary disease, unspecified: Secondary | ICD-10-CM | POA: Diagnosis not present

## 2017-11-20 DIAGNOSIS — Z23 Encounter for immunization: Secondary | ICD-10-CM

## 2017-11-20 MED ORDER — INSULIN GLARGINE 100 UNIT/ML SOLOSTAR PEN: PEN_INJECTOR | SUBCUTANEOUS | 3 refills | Status: DC

## 2017-11-20 MED ORDER — ROFLUMILAST 250 MCG PO TABS: 250.0000 mg | ORAL_TABLET | Freq: Every day | ORAL | 0 refills | Status: DC

## 2017-11-20 NOTE — Progress Notes (Signed)
COMMUNITY PALLIATIVE CARE RN NOTE  PATIENT NAME: [name] DOB: 25-Aug-1945 MRN: [id number]  PRIMARY CARE PROVIDER: Binnie Rail, MD  RESPONSIBLE PARTY:  Acct ID - Guarantor Home Phone Work Phone Relationship Acct Type  [id number] - Stahly,Madi [phone]  Self P/F     [address], [place], [place] [address]    PLAN OF CARE and INTERVENTION:  1. ADVANCE CARE PLANNING/GOALS OF CARE: Remain in her home and avoid going to the hospital 2. PATIENT/CAREGIVER EDUCATION: [employer] and Breathing Techniques 3. DISEASE STATUS: Joint visit made with Palliative Care SW, Lynn Duffy. Met with patient in her home. She is alert and oriented x 3, pleasant and conversational. Initially, upon arrival, after she transferred from her bed to her wheelchair, she was dyspneic. However, she was able to recover within 60 seconds. Continues with some shortness of breath during conversation. Remains on 6L/min via [place]. She continues to smoke. She has an appointment with her Pulmonologist this week for follow up and to have paperwork completed for recertification to continue her Oxygen therapy required by [facility]. Xanax remains effective for anxiety. She requires assistance with household chores and bathing. Her home health aide services have re-started since her bed bug issue is resolved. She is ambulatory with a walker, but spends most of her day sitting/lying. She denies pain. Will continue to monitor.    HISTORY OF PRESENT ILLNESS:  This is a 72 yo female who resides at home. Palliative Care Team continues to follow. Will continue to visit monthly and PRN.  CODE STATUS: FULL CODE ADVANCED DIRECTIVES: N MOST FORM: yes PPS: 40%   PHYSICAL EXAM:   VITALS: Today's Vitals   11/17/17 1357  BP: 110/70  Pulse: 84  Resp: 20  Temp: (!) 96.9 F (36.1 C)  TempSrc: Temporal  SpO2: 90%  PainSc: 0-No pain    LUNGS: decreased breath sounds CARDIAC: Cor RRR EXTREMITIES: negative  for edema SKIN: Exposed skin is dry and intact  NEURO: Alert and oriented x 3, pleasant mood, generalized weakness, ambulatory with walker   (Duration of visit and documentation 75 minutes)    Daryl Eastern, RN, BSN

## 2017-11-20 NOTE — Patient Instructions (Addendum)
ICD-10-CM   1. Chronic respiratory failure with hypoxia (HCC) J96.11   2. Stage 4 very severe COPD by GOLD classification ([place]) J44.9     Stable diseaes  PLAN Continue 5L [place]  Continue trelegy daily ontinue prednisone at 20mg  per day Continue nebs alb as needed Quit smoking Continue home palliative care High-dose flu shot today Start daliresp   - Take new tablet called roflumilast 1 tablet   -250 microgram x 30 days and then 556mcg daily; take with food   Followup  in 6 months or sooner if needed

## 2017-11-20 NOTE — Progress Notes (Signed)
PCP Binnie Rail, MD   HPI   OV 06/06/2016   Chief Complaint  Patient presents with  . Acute Visit    increased SOB x1 week, tightness in chest, slight wheezing, weakness/fatigue, prod cough with beige mucus in the mornings.  denies any f/c/s, hemoptysis,     [name]  female with chronic hypoxemic respiratory failure chronically on prednisone. In the past she had mediastinal adenopathy did seem to be improving. The base of chronic hypoxemic respiratory failure COPD/bronchiectasis. She's currently on 4-5 liters of oxygen. Then in February 2018 had an admission for COPD exacerbation. Although CT scan of chest at that time did show consolidation but she is not aware of pneumonia diagnosis. Since then she's been on 6 L oxygen. She feels baseline prednisone dose is 20 mg below which she gets symptomatic. She ran out of her prednisone last week and apparently only 10 mg was called in. After that she started noticing more symptoms with fatigue shortness of breath. Is also change in cough with slight change in discoloration of sputum. When she walked and she needed 8 L of oxygen to correct the hypoxemia according to the intake nurse. However she denies that she is sick that she needs hospitalization. She is adamantly refusing hospitalization. She does have slight worsening in her pedal edema compared to baseline. There is no DVT symptoms otherwise.     OV 06/19/2016  Chief Complaint  Patient presents with  . Follow-up    Pt states her SOB has slightly improved since last OV but is not back to baseline. Pt c/o prod cough with cloudy mucus - pt states it is worse at night and in the morning.    Follow-up chronic hypoxic respiratory failure chronically on prednisone. Saw her in 06/06/2016 during an acute exacerbation. She declined admission. We had treated with outpatient antibiotic and prednisone. She feels 20 mg of prednisone is the lowest she can go. Review of the chart actually  shows she has chronic hypercapnic respiratory failure as well. She still feels fatigued. She's not using nocturnal ventilation. Currently in the office today although she feels better she didn't desaturate to 80% while ambulating on 4 L of oxygen. Most recent chest x-ray and labs were all okay. This no sputum or fever at this point in time.  OV 12/23/2016  Chief Complaint  Patient presents with  . Follow-up    Pt developed a head cold x1 week ago which went to her chest. Had been on z pak and prednisone and when finished meds, symptoms started all over again. Pt became SOB when waiting for [employer] bus and driver called EMS due to SOB and sats being low. Sats had gone back to 90s by the time EMS arrived.     S: Baseline cOPD and chronic resp failure with CAT score 27 as below. Uses 4L O2 and is prednisone depdentn at 20mg  per day. Recent flareup and we tapered her prednisone just for the first 25 mg and she is feeling awful. She'll go back to 20 mg prednisone per day starting tomorrow. She is worried that she's having always prednisone withdrawal symptoms but understands that this is her. She did have a CT chest that shows stability in her mediastinal adenopathy. She does feel oxygen level is low but this is baseline. She wants to try home physical therapy again. She wants an inhaler change. She wants larger portable oxygen system. She also wants to have a better Medicare payment plan  that will allow her to be seen in this practice Means as his wife is not as climbing a leisurely like him      CT chest IMPRESSION: 1. Stable appearance of mediastinal adenopathy. 2. Bilateral hilar adenopathy is identified. This is difficult to accurately compare with previous unenhanced studies, but is likely similar to 02/25/2016. 3. Small nonspecific pulmonary nodules are unchanged from previous exam. 4. Aortic Atherosclerosis (ICD10-I70.0). Lad coronary artery calcifications noted. 5.  Emphysema  (ICD10-J43.9). He says his wife is  Electronically Signed   By: Kerby Moors M.D.   On: 10/30/2016 08:23  OV 11/20/2017  Subjective:  Patient ID: [name], female , DOB: 06-22-1945 , age 72 y.o. , MRN: [id number] , ADDRESS: [address] [place] [address]   11/20/2017 -   Chief Complaint  Patient presents with  . Follow-up    States her breathing has been at her baseline.      HPI [name] 72 y.o. -presents for follow-up of advanced COPD and chronic hypoxemic respiratory failure.  She is on 5 L oxygen with home palliative care for chronic prednisone and Trelegy inhaler.  Overall she is stable.  She continues to smoke.  She lives by herself and manages her ADLs.  Home palliative care visits her.  Her nephew does her groceries and helps her in the home.  She does have an aide.  She is estranged from her son who does not believe that she is chronically ill because she continues to be life expectancy.  She will take a flu shot today.  She understands recurrent exacerbations are a critical element and COPD and life expectancy.  She is asking for additional measures.  We discussed Roflumilast and she is interested.    CAT COPD Symptom & Quality of Life Score (GSK trademark) 0 is no burden. 5 is highest burden 12/23/2016   Never Cough -> Cough all the time 3  No phlegm in chest -> Chest is full of phlegm 4  No chest tightness -> Chest feels very tight 0  No dyspnea for 1 flight stairs/hill -> Very dyspneic for 1 flight of stairs 5  No limitations for ADL at home -> Very limited with ADL at home 4  Confident leaving home -> Not at all confident leaving home 3  Sleep soundly -> Do not sleep soundly because of lung condition 3  Lots of Energy -> No energy at all 5  TOTAL Score (max 40)  27     Results for [name], [name] (MRN [id number]) as of 06/19/2016 12:23  Ref. Range 07/29/2013 08:30  FEV1-Post Latest Units: L 1.00  FEV1-%Pred-Post Latest Units: % 53   FEV1-%Change-Post Latest Units: % 12    Results for [name], [name] (MRN [id number]) as of 12/23/2016 12:27  Ref. Range 09/26/2016 13:55  O2 Content Latest Units: L/min 5.0  pH, Arterial Latest Ref Range: 7.350 - 7.450  7.402  pCO2 arterial Latest Ref Range: 32.0 - 48.0 mmHg 44.1  pO2, Arterial Latest Ref Range: 83.0 - 108.0 mmHg 52.3 (L)    ROS - per HPI     has a past medical history of Arrhythmia, Bronchiectasis (march 2012), CHF (congestive heart failure) ([place]), Chicken pox, Chronic diastolic heart failure (HCC), Chronic respiratory failure ([place]), COPD (chronic obstructive pulmonary disease) ([place]), Generalized anxiety disorder, History of cervical cancer (1982), Hyperlipidemia, Hypertension, Leg swelling, 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  reports that she has been smoking cigarettes. She has a 22.00 pack-year smoking history. She has never used smokeless tobacco.  Past Surgical History:  Procedure Laterality Date  . TOTAL ABDOMINAL HYSTERECTOMY      Allergies  Allergen Reactions  . Ambien [Zolpidem Tartrate] Other (See Comments)    Causes nightmares    Immunization History  Administered Date(s) Administered  . Influenza Split 11/12/2010  . Influenza, High Dose Seasonal PF 11/09/2012, 10/31/2015  . Influenza,inj,Quad PF,6+ Mos 10/20/2014  . Pneumococcal Conjugate-13 05/17/2013  . Pneumococcal Polysaccharide-23 03/22/2010, 06/24/2016    Family History  Problem Relation Age of Onset  . Other Father        MVA  . Esophageal cancer Mother      Current Outpatient Medications:  .  albuterol (PROVENTIL HFA;VENTOLIN HFA) 108 (90 Base) MCG/ACT inhaler, Inhale 2 puffs into the lungs every 6 (six) hours as needed for wheezing or shortness of breath., Disp: 1 Inhaler, Rfl: 5 .  albuterol (PROVENTIL) (2.5 MG/3ML) 0.083% nebulizer solution,  Take 2.5 mg by nebulization every 6 (six) hours as needed for wheezing or shortness of breath., Disp: , Rfl:  .  ALPRAZolam (XANAX) 0.5 MG tablet, TAKE 1 TABLET BY MOUTH THREE TIMES A DAY AS NEEDED FOR ANXIETY (MAY FILL 08/30/17), Disp: 90 tablet, Rfl: 0 .  fluticasone (FLONASE) 50 MCG/ACT nasal spray, USE 2 SPRAYS IN EACH NOSTRIL EVERY DAY AS NEEDED FOR ALLERGIES AND RHINITIS, Disp: 48 g, Rfl: 1 .  furosemide (LASIX) 20 MG tablet, Take 3 tablets (60 mg total) by mouth 2 (two) times daily. (Patient taking differently: Take 40 mg by mouth daily. ), Disp: 270 tablet, Rfl: 0 .  GLUCOCOM LANCETS 33G MISC, Use with Test Strips to take blood sugars, Disp: 100 each, Rfl: 5 .  glucose blood (BAYER CONTOUR NEXT TEST) test strip, Use to take blood sugars twice daily., Disp: 100 each, Rfl: 3 .  Insulin Glargine (LANTUS SOLOSTAR) 100 UNIT/ML Solostar Pen, INJECT 32 UNITS INTO THE SKIN DAILY AT 10 PM. (Patient taking differently: 22 Units. INJECT 32 UNITS INTO THE SKIN DAILY AT 10 PM.), Disp: 3 pen, Rfl: 3 .  Insulin Pen Needle (BD PEN NEEDLE NANO U/F) 32G X 4 MM MISC, USE TO ADMINISTER LANTUS INSULIN, Disp: 100 each, Rfl: 3 .  nystatin (MYCOSTATIN) 100000 UNIT/ML suspension, Take 5 mLs (500,000 Units total) by mouth 4 (four) times daily., Disp: 200 mL, Rfl: 0 .  pantoprazole (PROTONIX) 40 MG tablet, Take 1 tablet (40 mg total) by mouth daily., Disp: 30 tablet, Rfl: 0 .  PARoxetine (PAXIL) 40 MG tablet, Take 1 tablet (40 mg total) by mouth every morning., Disp: 90 tablet, Rfl: 1 .  predniSONE (DELTASONE) 10 MG tablet, 20mg  (2 tablets) daily, Disp: 90 tablet, Rfl: 0 .  TRELEGY ELLIPTA 100-62.5-25 MCG/INH AEPB, TAKE 1 PUFF BY MOUTH EVERY DAY, Disp: 180 each, Rfl: 1 .  triamcinolone cream (KENALOG) 0.1 %, Apply 1 application topically 2 (two) times daily., Disp: 30 g, Rfl: 0 .  Roflumilast (DALIRESP) 250 MCG TABS, Take 250 mg by mouth daily. Take 250mg  daily x 30 days, then Take 500mg  daily., Disp: 90 tablet, Rfl:  0      Objective:   Vitals:   11/20/17 1543  BP: 94/60  Pulse: (!) 109  SpO2: 93%  Weight: 212 lb 6.4 oz (96.3 kg)  Height: 5\' 4"  (1.626 m)    Estimated body mass index is 36.46 kg/m as calculated from the following:   Height as of  this encounter: 5\' 4"  (1.626 m).   Weight as of this encounter: 212 lb 6.4 oz (96.3 kg).  @WEIGHTCHANGE @  [employer]   11/20/17 1543  Weight: 212 lb 6.4 oz (96.3 kg)     Physical Exam  General Appearance:    Alert, cooperative, no distress, appears stated age - older , Deconditioned looking - yes , OBESE  - yes, Sitting on Wheelchair -  stroller  Head:    Normocephalic, without obvious abnormality, atraumatic  Eyes:    PERRL, conjunctiva/corneas clear,  Ears:    Normal TM's and external ear canals, both ears  Nose:   Nares normal, septum midline, mucosa normal, no drainage    or sinus tenderness. OXYGEN ON  - ye . Patient is @ 5:   Throat:   Lips, mucosa, and tongue normal; teeth and gums normal. Cyanosis on lips - no  Neck:   Supple, symmetrical, trachea midline, no adenopathy;    thyroid:  no enlargement/tenderness/nodules; no carotid   bruit or JVD  Back:     Symmetric, no curvature, ROM normal, no CVA tenderness  Lungs:     Distress - no , Wheeze no, Barrell Chest - yes, Purse lip breathing - yes, Crackles - no   Chest Wall:    No tenderness or deformity.    Heart:    Regular rate and rhythm, S1 and S2 normal, no rub   or gallop, Murmur - no  Breast Exam:    NOT DONE  Abdomen:     Soft, non-tender, bowel sounds active all four quadrants,    no masses, no organomegaly. Visceral obesity - yes  Genitalia:   NOT DONE  Rectal:   NOT DONE  Extremities:   Extremities - normal, Has Cane - no, Clubbing - yes, Edema - no  Pulses:   2+ and symmetric all extremities  Skin:   Stigmata of Connective Tissue Disease - no  Lymph nodes:   Cervical, supraclavicular, and axillary nodes normal  Psychiatric:  Neurologic:   Pleasant - yes, Anxious -  no, Flat affect - no  CAm-ICU - neg, Alert and Oriented x 3 - yes, Moves all 4s - yes, Speech - normal, Cognition - intact           Assessment:       ICD-10-CM   1. Chronic respiratory failure with hypoxia (HCC) J96.11   2. Stage 4 very severe COPD by GOLD classification ([place]) J44.9        Plan:     Patient Instructions     ICD-10-CM   1. Chronic respiratory failure with hypoxia (HCC) J96.11   2. Stage 4 very severe COPD by GOLD classification ([place]) J44.9     Stable diseaes  PLAN Continue 5L [place]  Continue trelegy daily ontinue prednisone at 20mg  per day Continue nebs alb as needed Quit smoking Continue home palliative care High-dose flu shot today Start daliresp   - Take new tablet called roflumilast 1 tablet   -250 microgram x 30 days and then 566mcg daily; take with food   Followup  in 6 months or sooner if needed   > 50% of this > 25 min visit spent in face to face counseling or coordination of care - by this undersigned MD - Dr Brand Males. This includes one or more of the following documented above: discussion of test results, diagnostic or treatment recommendations, prognosis, risks and benefits of management options, instructions, education, compliance or risk-factor reduction    SIGNATURE  Dr. Brand Males, M.D., F.C.C.P,  Pulmonary and Critical Care Medicine Staff Physician, [employer] Director - Interstitial Lung Disease  Program  [facility] [place], [place], [address]  Pager: [phone], If no answer or between  15:00h - 7:00h: call [phone] Telephone: [phone]  4:36 PM 11/20/2017

## 2017-11-24 ENCOUNTER — Other Ambulatory Visit: Payer: Self-pay

## 2017-11-24 ENCOUNTER — Ambulatory Visit: Payer: Self-pay

## 2017-11-24 NOTE — Patient Outreach (Signed)
[facility]) Care Management  11/24/2017  [name] 27-Nov-1945 [id number]  Unsuccessful outreach to the patient on today's date in effort to confirm receipt of mailing. BSW left a HIPAA compliant voice message requesting a return call.  Plan: BSW to attempt to outreach the patient within the next four business days.  Daneen Schick, BSW, CDP [facility] [phone]

## 2017-11-25 ENCOUNTER — Ambulatory Visit: Payer: Medicare Other | Admitting: Gastroenterology

## 2017-11-27 ENCOUNTER — Other Ambulatory Visit: Payer: Self-pay

## 2017-11-27 ENCOUNTER — Ambulatory Visit: Payer: Self-pay

## 2017-11-27 ENCOUNTER — Encounter: Payer: Self-pay | Admitting: *Deleted

## 2017-11-27 ENCOUNTER — Other Ambulatory Visit: Payer: Self-pay | Admitting: *Deleted

## 2017-11-27 NOTE — Patient Outreach (Addendum)
[facility]) Care Management  [place]  11/27/2017   Lakelyn Straus 1945-07-12 [id number]   RN Health Coach Initial Assessment  Referral Date:  11/03/2017 Referral Source:  MD Referral Reason for Referral:  Housing Resources & Disease Management Education Insurance:  Medicare   Outreach Attempt:  Successful telephone outreach to patient for introduction and initial telephone assessment.  HIPAA verified with patient.  RN Health Coach introduced self and role.  Patient verbally agrees to Disease Management outreaches.  Completed initial telephone assessment.  Social:  Patient lives at home alone.  Reports needing assistance with ADLs and IADLs.  Ambulates with Rolator walker and reporting 2 falls in the past year.  Last fall was yesterday having caught her foot in the wheelchair.  Fall precautions and preventions reviewed and discussed.  Uses [employer] [facility] [employer] for transportation to medical appointments.  Receives home health aide 2 days a week, for 4 hours with [facility].  Patient also reporting being followed by [facility].  DME in the home include:  Rolator walker, wheelchair, bedside commode, shower chair with back, scale, CBG meter, nebulizer, oxygen concentrator and portable tanks, and grab bar in shower.  Conditions:   Per chart review and discussion with patient, PMH include but not limited to:  COPD, congestive heart failure, bronchiectasis, arrhythmia, anxiety disorder, cervical cancer with hysterectomy, hyperlipidemia, hypertension, steroid induced diabetes (per patient) and continues to smoke.  Reports she was using electronic cigarettes but stopped about 1 month ago.  Denies any recent hospitalizations or emergency room visits.  Wears home oxygen continues at 5 l/m.  Uses her rescue inhaler once daily and has not had to use her rescue nebulizer in the last few weeks.  Does report an increase in shortness of breath when  rushing to the restroom in the morning.  Discussed this with pulmonologist at last appointment and has new prescription medication; states she has not picked medication up from pharmacy as of yet.  Discussed importance of new medication, Dilaresp and encouraged patient to retrieve medication from pharmacy.  Does not weigh herself.  Discussed importance of daily weight monitoring.   Monitors blood sugars about twice a week.  Last blood sugar taken was 120.  Hgb A1C was 6.5 on 07/01/2017.  Patient reporting about a 15 pound weight loss over the past 4 months and relates it to change in eating habits, trying to stick with diabetic diet.  Reporting her home has been treated for bedbugs, and she is awaiting to get new furniture.  Medications:  Reports taking about 7 medications daily.  Denies any issues affording medications.  States she manages her medications herself without difficulties.  Does report she needs to pick up her newly prescribed prescription from Pulmonologist from the pharmacy.  Also reporting she is not taking Protonix listed on her medication list.  Discussed indications for Protonix and encouraged patient to discuss medication with her physician.  Encounter Medications:  Outpatient Encounter Medications as of 11/27/2017  Medication Sig Note  . albuterol (PROVENTIL HFA;VENTOLIN HFA) 108 (90 Base) MCG/ACT inhaler Inhale 2 puffs into the lungs every 6 (six) hours as needed for wheezing or shortness of breath.   Marland Kitchen albuterol (PROVENTIL) (2.5 MG/3ML) 0.083% nebulizer solution Take 2.5 mg by nebulization every 6 (six) hours as needed for wheezing or shortness of breath.   . ALPRAZolam (XANAX) 0.5 MG tablet TAKE 1 TABLET BY MOUTH THREE TIMES A DAY AS NEEDED FOR ANXIETY (MAY FILL 08/30/17)   .  fluticasone (FLONASE) 50 MCG/ACT nasal spray USE 2 SPRAYS IN EACH NOSTRIL EVERY DAY AS NEEDED FOR ALLERGIES AND RHINITIS   . furosemide (LASIX) 20 MG tablet Take 3 tablets (60 mg total) by mouth 2 (two) times  daily. (Patient taking differently: Take 40 mg by mouth daily. )   . GLUCOCOM LANCETS 33G MISC Use with Test Strips to take blood sugars   . glucose blood (BAYER CONTOUR NEXT TEST) test strip Use to take blood sugars twice daily.   . Insulin Glargine (LANTUS SOLOSTAR) 100 UNIT/ML Solostar Pen INJECT 32 UNITS INTO THE SKIN DAILY AT 10 PM. (Patient taking differently: 22 Units. INJECT 32 UNITS INTO THE SKIN DAILY AT 10 PM.) 11/27/2017: Reports taking 22 units at bedtime  . Insulin Pen Needle (BD PEN NEEDLE NANO U/F) 32G X 4 MM MISC USE TO ADMINISTER LANTUS INSULIN   . nystatin (MYCOSTATIN) 100000 UNIT/ML suspension Take 5 mLs (500,000 Units total) by mouth 4 (four) times daily. 11/27/2017: Takes as needed  . PARoxetine (PAXIL) 40 MG tablet Take 1 tablet (40 mg total) by mouth every morning.   . predniSONE (DELTASONE) 10 MG tablet 20mg  (2 tablets) daily   . Roflumilast (DALIRESP) 250 MCG TABS Take 250 mg by mouth daily. Take 250mg  daily x 30 days, then Take 500mg  daily. 11/27/2017: Still needs to pick up from pharmacy  . TRELEGY ELLIPTA 100-62.5-25 MCG/INH AEPB TAKE 1 PUFF BY MOUTH EVERY DAY   . triamcinolone cream (KENALOG) 0.1 % Apply 1 application topically 2 (two) times daily.   . pantoprazole (PROTONIX) 40 MG tablet Take 1 tablet (40 mg total) by mouth daily. (Patient not taking: Reported on 11/27/2017) 11/27/2017: Reports not taking   No facility-administered encounter medications on file as of 11/27/2017.     Functional Status:  In your present state of health, do you have any difficulty performing the following activities: 11/27/2017  Hearing? N  Vision? N  Difficulty concentrating or making decisions? N  Walking or climbing stairs? Y  Dressing or bathing? Y  Doing errands, shopping? Y  Preparing Food and eating ? Y  Using the Toilet? N  In the past six months, have you accidently leaked urine? Y  Do you have problems with loss of bowel control? N  Managing your Medications? N  Managing  your Finances? N  Housekeeping or managing your Housekeeping? Y  Some recent data might be hidden    Fall/Depression Screening: Fall Risk  11/27/2017 06/24/2016 01/27/2015  Falls in the past year? 1 No No  Number falls in past yr: 1 - -  Injury with Fall? 0 - -  Risk for fall due to : Impaired vision;Impaired balance/gait;Medication side effect;Impaired mobility - -  Follow up Falls prevention discussed;Education provided - -   PHQ 2/9 Scores 11/27/2017 11/03/2017 06/24/2016 01/27/2015 07/12/2013 09/14/2012 08/26/2012  PHQ - 2 Score 0 2 0 2 2 1 1   PHQ- 9 Score - 12 - 5 13 - -    Appointments:  Reports last attending appointment with primary care provider, Dr. Quay Burow on 07/01/2017 and has scheduled follow up on 12/31/2017.  Last seen Pulmonologist, Dr. [name] on 11/20/2017.  States she has an eye exam appointment scheduled for 01/09/2018 with Dr. Valetta Close.  Advanced Directives:  Patient denies having a Living Will or [place].  Does report having a PINK [place] form completed and in the home.   Consent: [facility] services reviewed and discussed.  Patient verbally agrees to Disease Management outreaches.  Plan:  RN Health Coach will send primary MD barriers letter. RN Health Coach will route initial telephone assessment note to primary MD. [facility] will send patient [place]. RN Health Coach will send patient Living Better with Heart Failure Packet. RN Health Coach will send patient 2020 Calendar Booklet. RN Health Coach will make next telephone outreach to patient within the month of January.   [place] [phone] [email or web address]

## 2017-11-27 NOTE — Patient Outreach (Signed)
[facility] [facility]) Care Management  11/27/2017  [name] [birth year] [id number]  Second unsuccessful call to the patient in efforts to confirm receipt of mailed resources. BSW left a HIPAA compliant voice message requesting a return call.  Plan: BSW to attempt a third and final outreach within the next four business days.  Daneen Schick, BSW, CDP [facility] [phone]

## 2017-11-28 ENCOUNTER — Other Ambulatory Visit: Payer: Self-pay

## 2017-11-28 MED ORDER — ALPRAZOLAM 0.5 MG PO TABS: ORAL_TABLET | ORAL | 0 refills | Status: DC

## 2017-11-28 NOTE — Telephone Encounter (Signed)
Last refill was 10/30/17 Last OV was 07/01/17 Next OV is 12/31/17

## 2017-12-02 ENCOUNTER — Other Ambulatory Visit: Payer: Self-pay

## 2017-12-02 ENCOUNTER — Ambulatory Visit: Payer: Self-pay

## 2017-12-02 NOTE — Patient Outreach (Signed)
[place] [facility]) Care Management  12/02/2017  [name] [birth year] [id number]  Case conference with [facility] telephonic health coach, Hubert Azure, who confirmed patient stated she has received resources mailed to her by this BSW. BSW to perform a discipline closure as the patient is actively involved with CSW through [place].  Daneen Schick, BSW, CDP [facility] [phone]

## 2017-12-08 ENCOUNTER — Telehealth: Payer: Self-pay | Admitting: *Deleted

## 2017-12-08 NOTE — Telephone Encounter (Signed)
Contacted patient to arrange a home visit. Visit scheduled for Thursday 12/11/17 at 12p

## 2017-12-11 ENCOUNTER — Other Ambulatory Visit: Payer: Medicare Other | Admitting: *Deleted

## 2017-12-11 ENCOUNTER — Other Ambulatory Visit: Payer: Medicare Other | Admitting: Licensed Clinical Social Worker

## 2017-12-11 DIAGNOSIS — Z515 Encounter for palliative care: Secondary | ICD-10-CM

## 2017-12-12 NOTE — Progress Notes (Signed)
COMMUNITY PALLIATIVE CARE SW NOTE  PATIENT NAME: [name] DOB: 1945-07-14 MRN: [id number]  PRIMARY CARE PROVIDER: Binnie Rail, MD  RESPONSIBLE PARTY:  Acct ID - Guarantor Home Phone Work Phone Relationship Acct Type  [id number] [facility] [phone]  Self P/F     [address], [place], [place] [address]     PLAN OF CARE and INTERVENTIONS:             1. GOALS OF CARE/ ADVANCE CARE PLANNING:  Patient's goal is to remain in her apartment and to remain as independent as possible.  She is a full code. 2. SOCIAL/EMOTIONAL/SPIRITUAL ASSESSMENT/ INTERVENTIONS:  SW and Palliative Care RN, [name], met with patient in her apartment.  She has not obtained furniture because her landlord has not cleaned her carpet or painted.  Patient is very good at getting her needs met.  SW provided active listening and supportive counseling while patient discussed other family members and friends.  She denied pain.  Patient is alert and oriented x4. 3. PATIENT/CAREGIVER EDUCATION/ COPING:  Patient copes by expressing herself openly.  She also smokes. 4. PERSONAL EMERGENCY PLAN:  Patient will contact a friend for support when needed.  She responds well to encouragement and gentle touch.   5. 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 6. FINANCIAL/LEGAL CONCERNS/INTERVENTIONS:  Patient is on a fixed income.     SOCIAL HX:  Social History   Tobacco Use  . Smoking status: Current Every Day Smoker    Packs/day: 0.50    Years: 44.00    Pack years: 22.00    Types: Cigarettes  . Smokeless tobacco: Never Used  . Tobacco comment: 3-4 cigaretters per day 11/20/2017   Substance Use Topics  . Alcohol use: Yes    Alcohol/week: 0.0 standard drinks    Comment: occasional    CODE STATUS:  Full Code ADVANCED DIRECTIVES: N MOST FORM COMPLETE:  Y HOSPICE EDUCATION PROVIDED: N PPS:  Patient's appetite is  normal.  She is able to stand and ambulates with a walker. Duration of visit and documentation:  75 minutes.      Toni Corn Charelle Petrakis, LCSW

## 2017-12-16 ENCOUNTER — Telehealth: Payer: Self-pay | Admitting: Internal Medicine

## 2017-12-16 MED ORDER — PREDNISONE 10 MG PO TABS: ORAL_TABLET | ORAL | 1 refills | Status: DC

## 2017-12-16 MED ORDER — DOXYCYCLINE HYCLATE 100 MG PO TABS: 100.0000 mg | ORAL_TABLET | Freq: Two times a day (BID) | ORAL | 0 refills | Status: DC

## 2017-12-16 NOTE — Telephone Encounter (Signed)
Patient is having SOB and chest congestion with yellow mucus. She is weak and unable to get up out of bed. She is unable to come in for an appointment. She denies any fever at this time.  Pharmacy is [facility] on [address]   MR please advise.

## 2017-12-16 NOTE — Telephone Encounter (Signed)
Called and spoke with pt letting her know that we were sending an Rx of pred taper and doxy in to her pharmacy for her. Stated to pt after these Rx's if she was no better, we would need to get her in for an appt.  Pt expressed understanding. Rx's sent to pt's preferred pharmacy. Nothing further needed.

## 2017-12-16 NOTE — Telephone Encounter (Signed)
    Allergies  Allergen Reactions  . Ambien [Zolpidem Tartrate] Other (See Comments)    Causes nightmares     Plan Please take Take prednisone 40mg  once daily x 3 days, then 30mg  once daily x 3 days, then 20mg  once daily x 3 days, then prednisone 10mg  once daily baseline dose to continue   Take doxycycline 100mg  po twice daily x 5 days; take after meals and avoid sunlight

## 2017-12-17 NOTE — Progress Notes (Signed)
COMMUNITY PALLIATIVE CARE RN NOTE  PATIENT NAME: [name] DOB: 07/30/1945 MRN: [id number]  PRIMARY CARE PROVIDER: Burns, Stacy J, MD  RESPONSIBLE PARTY:  Acct ID - Guarantor Home Phone Work Phone Relationship Acct Type  [id number] - Grey,Falan [phone]  Self P/F     [address], [place], [place] [address]    PLAN OF CARE and INTERVENTION:  1. ADVANCE CARE PLANNING/GOALS OF CARE: Remain in her home for as long as possible and to get her teeth fixed in order to eat better 2. PATIENT/CAREGIVER EDUCATION:Reinforced Safe Mobility/Transfers and Energy Conservation 3. DISEASE STATUS: Joint visit made with Palliative Care SW, Lynn Duffy. Met with patient in her home. She is awake and alert, watching TV. Pleasant mood and conversational. She denies any pain. She remains on Oxygen at 6L/min via [place]. Continued dyspnea with exertion and at times during conversation. She is taking her inhalers daily and Xanax PRN anxiety. She continues to smoke. She has a new upcoming appointment with a GI specialist d/t positive Cologuard test. She was also recently on a waiting list to get her teeth fixed and there is now an opening for her to see a Dentist in town. She has been having to eat chopped meats and softer foods. She has been decreasing her portion sizes. She reports that she has lost 15 lbs over the past few months, and is pleased with this. She continues with a hired caregiver 3x/week to help with personal care and household chores. She is ambulatory using her walker, but requires frequent rest periods d/t dyspnea. Will continue to monitor.   HISTORY OF PRESENT ILLNESS:  This is a 72 yo female who resides at home. Palliative Care Team continues to follow patient. Will continue to visit monthly and PRN.  CODE STATUS: FULL CODE  ADVANCED DIRECTIVES: Y MOST FORM: yes PPS: 40%   PHYSICAL EXAM:   VITALS: Today's Vitals   12/11/17 1248  BP: 118/78  Pulse: (!) 104  Resp: 20  Temp: (!) 97 F  (36.1 C)  TempSrc: Temporal  SpO2: 93%  PainSc: 0-No pain    LUNGS: clear to auscultation  CARDIAC: Cor RRR EXTREMITIES: No edema SKIN: Exposed skin is dry and intact, denies any skin issues  NEURO: Alert and oriented x 3, pleasant mood, generalized weakness, ambulatory with walker   (Duration of visit and documentation 75 minutes)    [name], RN, BSN 

## 2017-12-22 ENCOUNTER — Telehealth: Payer: Self-pay | Admitting: Internal Medicine

## 2017-12-22 NOTE — Telephone Encounter (Signed)
Called and spoke with patient regarding Daliresp Patient advises that her insurance covers Daliresp based on her insurance tier this year  in January 2020 the price will go for [employer]; requesting recommendation from MR.   MR please advise.

## 2017-12-22 NOTE — Telephone Encounter (Signed)
Patient states her insurance is saying they will cover Daliresp based on her insurance tier for the end of this year she could get the medication for cheap; but in January the price will go; requesting recommendation from MR.

## 2017-12-22 NOTE — Telephone Encounter (Signed)
We will have to do a letter of necessity or pre-authorixation. Till then samples. Daliresp is to reduce aecopd rate and so overall is beneficial to cost and patient

## 2017-12-22 NOTE — Telephone Encounter (Signed)
Called and spoke with patient, she stated that her insurance is no longer covering [place]. She is needing something else to be called in. Advised patient she could contact her insurance to see what they will cover and MR can send something in based on that. She stated she would call her insurance but wanted a message sent to MR as well.    MR please advise, thank you.

## 2017-12-22 NOTE — Telephone Encounter (Signed)
There is no other approved formular alternative in that category to reduce copd flare up. We can give sample. You can also call the  Drug rep Lanelle Bal Hatjis to see if there is a patient assistance program for this  There is a OTC medicine that in a chinese study showed reduced flare up. She can go to [facility] and price it  Start N. acetylcysteine 600 mg twice a day  - [place] study published in [place] in 2014 shows it helps   - this is not to make you feel better but to to prevent recurrent bronchitis episodes  - You are more likely to get this drug at [facility] or a vitamin store then at [employer] or [place] or [employer]  - You can also get this at website [employer]  - It functions as an anti-oxidant and there are minimal/none side effects

## 2017-12-23 NOTE — Telephone Encounter (Signed)
Called and spoke with pt letting her know the information from MR. I stated to her that we did have samples of daliresp if she wanted [place] to place them up front for her or if she wanted to try to see if the OTC med is cheaper, she could do that.  Pt stated she would try to see if the other med is cheaper and she would also call us if she decided she wanted the samples of daliresp. Nothing further needed.

## 2017-12-23 NOTE — Telephone Encounter (Signed)
Patient returned call, CB is [phone].

## 2017-12-23 NOTE — Telephone Encounter (Signed)
Attempted to contact pt. I did not receive an answer. There was not an option for me to leave a message. Will try back.  

## 2017-12-24 ENCOUNTER — Telehealth: Payer: Self-pay | Admitting: Internal Medicine

## 2017-12-24 MED ORDER — ALBUTEROL SULFATE HFA 108 (90 BASE) MCG/ACT IN AERS: 2.0000 | INHALATION_SPRAY | Freq: Four times a day (QID) | RESPIRATORY_TRACT | 5 refills | Status: AC | PRN

## 2017-12-24 MED ORDER — GLUCOSE BLOOD VI STRP: ORAL_STRIP | 3 refills | Status: DC

## 2017-12-24 NOTE — Telephone Encounter (Signed)
rx sent to pharmacy.  Left detailed message on named vm letting patient know that this has been sent. Nothing further needed.

## 2017-12-24 NOTE — Telephone Encounter (Signed)
Due to insurance- patient Rx has to have diagnosis code on the prescription. Please resend Rx to pharmacy with code on it.

## 2017-12-24 NOTE — Telephone Encounter (Signed)
Pt states pharmacy can not dispense the test strips until they have diagnosis code and how often she is suppose to take her blood sugar.

## 2017-12-24 NOTE — Addendum Note (Signed)
Addended by: Terence Lux B on: 12/24/2017 01:19 PM   Modules accepted: Orders

## 2017-12-24 NOTE — Telephone Encounter (Signed)
RX resent with DX code

## 2017-12-25 ENCOUNTER — Ambulatory Visit: Payer: Medicare Other | Admitting: Gastroenterology

## 2017-12-25 ENCOUNTER — Encounter: Payer: Self-pay | Admitting: Gastroenterology

## 2017-12-26 ENCOUNTER — Other Ambulatory Visit: Payer: Self-pay | Admitting: Internal Medicine

## 2017-12-26 NOTE — Telephone Encounter (Signed)
Last refill was 11/28/17 Last OV was 07/01/17 Next OV was 12/31/17

## 2017-12-31 ENCOUNTER — Ambulatory Visit: Payer: Medicare Other | Admitting: Internal Medicine

## 2018-01-02 ENCOUNTER — Other Ambulatory Visit: Payer: Self-pay | Admitting: Internal Medicine

## 2018-01-04 NOTE — Progress Notes (Signed)
Subjective:    Patient ID: [name], female    DOB: 1945/07/22, 72 y.o.   MRN: [id number]  HPI The patient is here for follow up.  Diabetes: She is taking her medication daily as prescribed. She is compliant with a diabetic diet. She is exercising regularly. She monitors her sugars and they have been running XXX. She checks her feet daily and denies foot lesions. She is up-to-date with an ophthalmology examination.   HFpEF, Hypertension: She is taking her medication daily. She is compliant with a low sodium diet.  She denies chest pain, palpitations, edema, shortness of breath and regular headaches. She is exercising regularly.  She does not monitor her blood pressure at home.    COPD, chronic resp failure on oxygen:  She is following with pulmonary.  She is taking steroids daily.  She uses her inhalers.   Anxiety: She is taking her paxil daily as prescribed and take xanax as needed. She denies any side effects from the medication. She feels her anxiety is well controlled and she is happy with her current dose of medication.   Depression: She is taking her medication daily as prescribed. She denies any side effects from the medication. She feels her depression is well controlled and she is happy with her current dose of medication.   GERD:  She is taking her medication daily as prescribed.  She denies any GERD symptoms and feels her GERD is well controlled.     Medications and allergies reviewed with patient and updated if appropriate.  Patient Active Problem List   Diagnosis Date Noted  . Psoriasis 07/01/2017  . Depression 06/24/2016  . Pneumonia of both lungs due to infectious organism   . Chronic respiratory failure with hypoxia ([place])   . Pulmonary hypertension ([place])   . Physical deconditioning 11/15/2015  . GERD (gastroesophageal reflux disease) 11/01/2015  . 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 Diabetes ([place]) 01/23/2015  . Fatigue 01/20/2015  . Chronic obstructive  pulmonary disease ([place]) 10/18/2014  . Obesity, unspecified 03/15/2013  . Chronic diastolic heart failure ([place]) 03/14/2013  . Chronic respiratory failure ([place]) 06/30/2012  . COPD (chronic obstructive pulmonary disease) ([place])   . Tobacco abuse   . Pulmonary nodule 03/22/2010  . 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 Bronchiectasis 03/22/2010  . Hyperlipidemia 06/13/2008  . Essential hypertension 03/06/2007  . Anxiety 02/20/2007  . MITRAL VALVE PROLAPSE 02/20/2007  . CERVICAL CANCER, HX OF 02/20/2007    Current Outpatient Medications on File Prior to Visit  Medication Sig Dispense Refill  . albuterol (PROVENTIL HFA;VENTOLIN HFA) 108 (90 Base) MCG/ACT inhaler Inhale 2 puffs into the lungs every 6 (six) hours as needed for wheezing or shortness of breath. 1 Inhaler 5  . albuterol (PROVENTIL) (2.5 MG/3ML) 0.083% nebulizer solution Take 2.5 mg by nebulization every 6 (six) hours as needed for wheezing or shortness of breath.    . ALPRAZolam (XANAX) 0.5 MG tablet TAKE 1 TABLET BY MOUTH THREE TIMES A DAY AS NEEDED FOR ANXIETY 90 tablet 0  . doxycycline (VIBRA-TABS) 100 MG tablet Take 1 tablet (100 mg total) by mouth 2 (two) times daily. 10 tablet 0  . fluticasone (FLONASE) 50 MCG/ACT nasal spray USE 2 SPRAYS IN EACH NOSTRIL EVERY DAY AS NEEDED FOR ALLERGIES AND RHINITIS 16 g 1  . furosemide (LASIX) 20 MG tablet Take 3 tablets (60 mg total) by mouth 2 (two) times daily. (Patient taking differently: Take 40 mg by mouth daily. ) 270 tablet 0  . GLUCOCOM  LANCETS 33G MISC Use with Test Strips to take blood sugars 100 each 5  . glucose blood (CONTOUR NEXT TEST) test strip USE TO TAKE BLOOD SUGARS TWICE DAILY. 180 each 3  . Insulin Glargine (LANTUS SOLOSTAR) 100 UNIT/ML Solostar Pen INJECT 32 UNITS INTO THE SKIN DAILY AT 10 PM. (Patient taking differently: 22 Units. INJECT 32 UNITS INTO THE SKIN DAILY AT 10 PM.) 3 pen 3  . Insulin Pen Needle (BD PEN NEEDLE NANO U/F) 32G X 4 MM MISC USE TO ADMINISTER LANTUS  INSULIN 100 each 3  . nystatin (MYCOSTATIN) 100000 UNIT/ML suspension Take 5 mLs (500,000 Units total) by mouth 4 (four) times daily. 200 mL 0  . pantoprazole (PROTONIX) 40 MG tablet Take 1 tablet (40 mg total) by mouth daily. (Patient not taking: Reported on 11/27/2017) 30 tablet 0  . PARoxetine (PAXIL) 40 MG tablet TAKE 1 TABLET BY MOUTH EVERY DAY IN THE MORNING 90 tablet 1  . predniSONE (DELTASONE) 10 MG tablet Take 4tabsx3days,3tabsx3days,2tabsx3days,then continue taking 1tab daily 60 tablet 1  . Roflumilast (DALIRESP) 250 MCG TABS Take 250 mg by mouth daily. Take 250mg  daily x 30 days, then Take 500mg  daily. 90 tablet 0  . TRELEGY ELLIPTA 100-62.5-25 MCG/INH AEPB TAKE 1 PUFF BY MOUTH EVERY DAY 180 each 1  . triamcinolone cream (KENALOG) 0.1 % Apply 1 application topically 2 (two) times daily. 30 g 0   No current facility-administered medications on file prior to visit.     Past Medical History:  Diagnosis Date  . Arrhythmia   . Bronchiectasis march 2012   On CT chest. RUL. Mild  . CHF (congestive heart failure) ([place])   . Chicken pox   . Chronic diastolic heart failure ([place])   . Chronic respiratory failure ([place])    Followed in Pulmonary clinic/ [facility]/ Ramaswamy  - 06/30/2012 desat to 86%  RA walking 50 ft, recovered to 90% at rest - 06/30/2012  Walked 1lpm x 3 laps @ 185 ft each stopped due to  Sob, no desat  rec 02 2lpm with activity and sleeping, ok at rest   . COPD (chronic obstructive pulmonary disease) ([place])     FEV-1 in 2008 was 63% with a diffusion capacity of 33%.   . Generalized anxiety disorder   . History of cervical cancer 1982  . Hyperlipidemia   . Hypertension   . Leg swelling    Venous doppler right 07/21/12 >>Neg    . 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 Medical non-compliance   . MITRAL VALVE PROLAPSE   . Obesity, unspecified   . Pulmonary nodule march 2012   83mm RUL and RLL 1st seen march 2012 CT, ?progression  12/2012 CT  . Seasonal allergies   . Tobacco abuse     Smokes one pack a day since age 82.    Past Surgical History:  Procedure Laterality Date  . TOTAL ABDOMINAL HYSTERECTOMY      Social History   Socioeconomic History  . Marital status: Divorced    Spouse name: Not on file  . Number of children: Not on file  . Years of education: Not on file  . Highest education level: Not on file  Occupational History  . Occupation: works at a call center  Social Needs  . Financial resource strain: Not on file  . Food insecurity:    Worry: Not on file    Inability: Not on file  . Transportation  needs:    Medical: Not on file    Non-medical: Not on file  Tobacco Use  . Smoking status: Current Every Day Smoker    Packs/day: 0.50    Years: 44.00    Pack years: 22.00    Types: Cigarettes  . Smokeless tobacco: Never Used  . Tobacco comment: 3-4 cigaretters per day 11/20/2017   Substance and Sexual Activity  . Alcohol use: Yes    Alcohol/week: 0.0 standard drinks    Comment: occasional  . Drug use: No  . Sexual activity: Not on file  Lifestyle  . Physical activity:    Days per week: Not on file    Minutes per session: Not on file  . Stress: Not on file  Relationships  . Social connections:    Talks on phone: Not on file    Gets together: Not on file    Attends religious service: Not on file    Active member of club or organization: Not on file    Attends meetings of clubs or organizations: Not on file    Relationship status: Not on file  Other Topics Concern  . Not on file  Social History Narrative   Married but recently separated from husband    Family History  Problem Relation Age of Onset  . Other Father        MVA  . Esophageal cancer Mother     Review of Systems     Objective:  There were no vitals filed for this visit. BP Readings from Last 3 Encounters:  12/11/17 118/78  11/20/17 94/60  11/17/17 110/70   Wt Readings from Last 3 Encounters:  11/20/17  212 lb 6.4 oz (96.3 kg)  07/01/17 222 lb (100.7 kg)  12/23/16 228 lb 9.6 oz (103.7 kg)   There is no height or weight on file to calculate BMI.   Physical Exam         Assessment & Plan:    See Problem List for Assessment and Plan of chronic medical problems.   This encounter was created in error - please disregard.

## 2018-01-05 ENCOUNTER — Encounter: Payer: Medicare Other | Admitting: Internal Medicine

## 2018-01-08 ENCOUNTER — Telehealth: Payer: Self-pay | Admitting: Licensed Clinical Social Worker

## 2018-01-08 NOTE — Telephone Encounter (Signed)
Palliative Care SW spoke with patient and scheduled a home visit for Monday, 12/23, at 12:30pm.

## 2018-01-12 ENCOUNTER — Other Ambulatory Visit: Payer: Medicare Other | Admitting: Licensed Clinical Social Worker

## 2018-01-12 DIAGNOSIS — Z515 Encounter for palliative care: Secondary | ICD-10-CM

## 2018-01-13 ENCOUNTER — Other Ambulatory Visit: Payer: Medicare Other | Admitting: Licensed Clinical Social Worker

## 2018-01-15 NOTE — Progress Notes (Signed)
COMMUNITY PALLIATIVE CARE SW NOTE  PATIENT NAME: [name] DOB: [birth year] MRN: [id number]  PRIMARY CARE PROVIDER: Binnie Rail, MD  RESPONSIBLE PARTY:  Acct ID - Guarantor Home Phone Work Phone Relationship Acct Type  [id number] [facility] [phone]  Self P/F     [address], [place], [place] [address]    PLAN OF CARE and INTERVENTIONS:             1. GOALS OF CARE/ ADVANCE CARE PLANNING:  Patient wishes to remain in her apartment.  She also wants to avoid hospitalizations.  She is a full code. 2. SOCIAL/EMOTIONAL/SPIRITUAL ASSESSMENT/ INTERVENTIONS:  SW met with patient in her apartment.  She appeared to have labored breathing and had just used her inhailer.  Patient reports feeling lonely and discussed using a Palliative Care Volunteer.  She agreed and SW will request.  Her apartment was painted and some of her carpet was cleaned.  She is still waiting to order her furniture and was lying on the floor.  SW provided active listening and supportive counseling while she discussed her relation with her son and other family members.  She denied pain.  Patient is alert and oriented x4. 3. PATIENT/CAREGIVER EDUCATION/ COPING:  Patient copes by expressing herself openly.  She also smokes. 4. PERSONAL EMERGENCY PLAN:  Patient will contact a friend for support.   5. 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 6. FINANCIAL/LEGAL CONCERNS/INTERVENTIONS:  Patient is on a fixed income.      SOCIAL HX:  Social History   Tobacco Use  . Smoking status: Current Every Day Smoker    Packs/day: 0.50    Years: 44.00    Pack years: 22.00    Types: Cigarettes  . Smokeless tobacco: Never Used  . Tobacco comment: 3-4 cigaretters per day 11/20/2017   Substance Use Topics  . Alcohol use: Yes    Alcohol/week: 0.0 standard drinks    Comment: occasional   CODE STATUS:  Full Code ADVANCED DIRECTIVES:  N MOST FORM COMPLETE:  Y HOSPICE EDUCATION PROVIDED: N PPS:  Patient's appetite is normal.  She is able to stand and ambulates with a walker. Duration of visit and documentation:  75 minutes.    Creola Corn Aubriegh Minch, LCSW

## 2018-01-16 ENCOUNTER — Other Ambulatory Visit: Payer: Self-pay | Admitting: Internal Medicine

## 2018-01-19 ENCOUNTER — Telehealth: Payer: Self-pay | Admitting: Internal Medicine

## 2018-01-19 ENCOUNTER — Other Ambulatory Visit: Payer: Self-pay | Admitting: Internal Medicine

## 2018-01-19 ENCOUNTER — Other Ambulatory Visit: Payer: Medicare Other | Admitting: *Deleted

## 2018-01-19 DIAGNOSIS — Z515 Encounter for palliative care: Secondary | ICD-10-CM

## 2018-01-19 NOTE — Telephone Encounter (Signed)
Called patient, unable to reach left message to give us a call back. 

## 2018-01-19 NOTE — Telephone Encounter (Signed)
Patient returned call, CB is [phone].

## 2018-01-19 NOTE — Telephone Encounter (Signed)
Pt. Needs to be seen in the office . She has a fever, and she had meds called in 12/16/2017, but never came in for a follow up. Please have her come to the office for an acute visit as this is getting to be a more frequent occurrence..Thanks

## 2018-01-19 NOTE — Telephone Encounter (Signed)
Called and spoke with patient, advised her of response below. Patient stated that she does not want to come in for an appointment. Patient denied making an appointment. Advised patient that if she got worse she can be seen in urgent care or the ER. Also advised patient that she could call back and we would get her set up with an appointment here. Nothing further needed.

## 2018-01-19 NOTE — Telephone Encounter (Signed)
Called and spoke with patient, she stated that she thinks she has a COPD flare up. Patient complains of increased SOB, weakness and fatigue, chest congestion and deep cough. Patient has been sneezing and has chills. Patient denies fever, no body aches. Patient would like something to be called in. Please advise, thank you.

## 2018-01-19 NOTE — Progress Notes (Signed)
COMMUNITY PALLIATIVE CARE RN NOTE  PATIENT NAME: [name] DOB: [birth year] MRN: [id number]  PRIMARY CARE PROVIDER: Binnie Rail, MD  RESPONSIBLE PARTY:  Acct ID - Guarantor Home Phone Work Phone Relationship Acct Type  [id number] - Hai,Mannie [phone]  Self P/F     [address], [place], [place] [address]    PLAN OF CARE and INTERVENTION:  1. ADVANCE CARE PLANNING/GOALS OF CARE: She wants to remain in her home and avoid hospitalizations 2. PATIENT/CAREGIVER EDUCATION: [employer], Current medication regimen and Relaxation Techniques 3. DISEASE STATUS: Received a call this am from patient stating that she is not breathing well. Met with patient in her home. She reports that she has calmed since her phone call earlier. She denies pain, but is still taking deep breaths after every 5-6 words spoken. Her breathing improved as the visit progressed. She has a nonproductive occasional dry cough. She is afebrile. She has taken her last Xanax tablet today and has placed a call to the pharmacy to have this medication refilled. She has not been taking her Lasix for several days due to reports of urinary frequency. Denies any s/s of UTI. Explained the importance of Lasix and recommended that she restart this medication. She also has a refill for Prednisone at the pharmacy which I also recommended that she start immediately since upon auscultation of her lungs, very little air movement was heard. She has turned up her Oxygen to 7L/min via [place] and her Oxygen only reaches 88%. I advise patient to contact Palliative Care if her condition does not improve. She has placed a call into her Pulmonologist and is awaiting a call back. No edema noted to bilateral lower extremities. Due to low energy and fatigue, placed bedside commode directly beside patient's chair for energy conservation. She is pleasant and conversational throughout visit. She spoke about her recent appointment with a  Dentist, and she is having upper dentures made via a free program. Her intake is normal. [employer] came today to deliver lunch during visit. She remains ambulatory with her walker. Will continue to monitor.  HISTORY OF PRESENT ILLNESS:  This is a 72 yo female who resides at home alone. Palliative Care Continues to follow patient. Next visit scheduled for 01/23/18 but will visit sooner if needed. She has an upcoming appointment with her PCP on Monday 01/26/18/  CODE STATUS: FULL CODE ADVANCED DIRECTIVES: N MOST FORM: yes PPS: 50%   PHYSICAL EXAM:   VITALS: Today's Vitals   01/19/18 1036  BP: 138/82  Pulse: 96  Resp: (!) 24  Temp: (!) 97.5 F (36.4 C)  TempSrc: Temporal  SpO2: (!) 88%  PainSc: 0-No pain    LUNGS: decreased breath sounds CARDIAC: Cor RRR EXTREMITIES: No edema SKIN: Exposed skin is dry and intact  NEURO: Alert and oriented x 3, pleasant mood, increased generalized weakness, ambulatory with walker   (Duration of visit and documentation 90 minutes)    Daryl Eastern, RN, BSN

## 2018-01-22 ENCOUNTER — Ambulatory Visit: Payer: Self-pay | Admitting: *Deleted

## 2018-01-22 MED ORDER — DOXYCYCLINE HYCLATE 100 MG PO TABS: 100.0000 mg | ORAL_TABLET | Freq: Two times a day (BID) | ORAL | 0 refills | Status: DC

## 2018-01-22 NOTE — Telephone Encounter (Signed)
Pt calling with complaints of increased SOB with activities. Pt states she has a dry cough and congestion but she is not coughing up any sputum at this time. Pt that she has congestion but denies having a fever, chest pain or wheezing at this time. Pt has appt previously scheduled on 01/26/18 and wants to know if Dr. Quay Burow will be able to call her in an antibiotic until that time. Pt offered to make a sooner appt but pt declines at this time. If antibiotic could be called in pt would like to use [facility] on [address]. Pt states she is not able to come in for an appt due to transportation and states she uses [facility] and would have to call before 5 pm today to make sure she has transportation to an appt scheduled for tomorrow.   Reason for Disposition . [1] MILD difficulty breathing (e.g., minimal/no SOB at rest, SOB with walking, pulse <100) AND [2] NEW-onset or WORSE than normal  Answer Assessment - Initial Assessment Questions 1. RESPIRATORY STATUS: "Describe your breathing?" (e.g., wheezing, shortness of breath, unable to speak, severe coughing)      Shortness of breath with activities 2. ONSET: "When did this breathing problem begin?"      Has increased since 3-4 days ago 3. PATTERN "Does the difficult breathing come and go, or has it been constant since it started?"      Comes and goes and gets worse with activity 4. SEVERITY: "How bad is your breathing?" (e.g., mild, moderate, severe)    - MILD: No SOB at rest, mild SOB with walking, speaks normally in sentences, can lay down, no retractions, pulse < 100.    - MODERATE: SOB at rest, SOB with minimal exertion and prefers to sit, cannot lie down flat, speaks in phrases, mild retractions, audible wheezing, pulse 100-120.    - SEVERE: Very SOB at rest, speaks in single words, struggling to breathe, sitting hunched forward, retractions, pulse > 120      mild 5. RECURRENT SYMPTOM: "Have you had difficulty breathing before?" If so, ask: "When was the last  time?" and "What happened that time?"      Yes has a history of COPD 6. CARDIAC HISTORY: "Do you have any history of heart disease?" (e.g., heart attack, angina, bypass surgery, angioplasty)      Congestive heart failure 7. LUNG HISTORY: "Do you have any history of lung disease?"  (e.g., pulmonary embolus, asthma, emphysema)     COPD 8. CAUSE: "What do you think is causing the breathing problem?"      COPD exacerbation 9. OTHER SYMPTOMS: "Do you have any other symptoms? (e.g., dizziness, runny nose, cough, chest pain, fever)     cough 10. PREGNANCY: "Is there any chance you are pregnant?" "When was your last menstrual period?"       n/a 11. TRAVEL: "Have you traveled out of the country in the last month?" (e.g., travel history, exposures)       no  Protocols used: BREATHING DIFFICULTY-A-AH

## 2018-01-22 NOTE — Telephone Encounter (Signed)
Pt aware of response below.  

## 2018-01-22 NOTE — Telephone Encounter (Signed)
Yes I will send an antibiotic in but if her symptoms do not improve or worsen she needs to be seen

## 2018-01-23 ENCOUNTER — Other Ambulatory Visit: Payer: Medicare Other | Admitting: *Deleted

## 2018-01-23 DIAGNOSIS — Z515 Encounter for palliative care: Secondary | ICD-10-CM

## 2018-01-25 NOTE — Progress Notes (Signed)
Subjective:    Patient ID: [name], female    DOB: 04-07-1945, 73 y.o.   MRN: [id number]  HPI The patient is here for follow up.  COPD exacerbation, chronic respiratory failure on oxygen: She called both pulmonary and myself last week not feeling well.  She was having cough and increased shortness of breath.  She is not able to come in for an appointment.  She did have a prednisone taper from pulmonary that she started on I sent her in doxycycline that she started.  The first couple of days she did feel better, but since then has felt worse.  She states she does not understand because her oxygen level is fairly good, but she still feels short of breath.  The cough has improved and has not had much cough today.  She denies any wheezing, fevers or chills.  She has noted some increase in anxiety and at the end of the visit states she wonders if that is contributing-she has been out of her Xanax and Paxil.  She does feel anxious.  Diabetes: She is taking her medication daily as prescribed. She is compliant with a diabetic diet. She is not exercising regularly. She monitors her sugars and they have been running 120's yesterday.    HFpEF, Hypertension: She is taking Lasix daily.  She is not on any other blood pressure medication.. She is compliant with a low sodium diet.  She has chronic shortness of breath that is worse recently associated with her cough and likely flareup of COPD.  She has had some edema recently in both legs.  She is no edema in the morning and then it accumulates as the day goes on.  She denies chest pain or palpitations.    Anxiety: She is having increased anxiety.  She states she no longer has her Paxil and is unsure why.  Pharmacy says she is not ready for refill, but she has no pills in her bottle and she is not taking it in a little while.  She also recently ran out of the alprazolam.  When it was last filled she was given 1 mg pills because the pharmacy had a shortage of  0.5 mg pills and it was difficult to cut and she thinks she went through more of it for that reason.  She does feel more anxious without the medication.  GERD:  She is taking her medication daily as prescribed.  She denies any GERD symptoms and feels her GERD is well controlled.     Medications and allergies reviewed with patient and updated if appropriate.  Patient Active Problem List   Diagnosis Date Noted  . Psoriasis 07/01/2017  . Depression 06/24/2016  . Pneumonia of both lungs due to infectious organism   . Chronic respiratory failure with hypoxia ([place])   . Pulmonary hypertension ([place])   . Physical deconditioning 11/15/2015  . GERD (gastroesophageal reflux disease) 11/01/2015  . 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 Diabetes ([place]) 01/23/2015  . Fatigue 01/20/2015  . Chronic obstructive pulmonary disease ([place]) 10/18/2014  . Obesity, unspecified 03/15/2013  . Chronic diastolic heart failure ([place]) 03/14/2013  . Chronic respiratory failure ([place]) 06/30/2012  . COPD (chronic obstructive pulmonary disease) ([place])   . Tobacco abuse   . Pulmonary nodule 03/22/2010  . 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 Bronchiectasis 03/22/2010  . Hyperlipidemia 06/13/2008  . Essential hypertension 03/06/2007  . Anxiety 02/20/2007  . MITRAL VALVE PROLAPSE 02/20/2007  . CERVICAL CANCER, HX OF 02/20/2007  Current Outpatient Medications on File Prior to Visit  Medication Sig Dispense Refill  . albuterol (PROVENTIL HFA;VENTOLIN HFA) 108 (90 Base) MCG/ACT inhaler Inhale 2 puffs into the lungs every 6 (six) hours as needed for wheezing or shortness of breath. 1 Inhaler 5  . albuterol (PROVENTIL) (2.5 MG/3ML) 0.083% nebulizer solution Take 2.5 mg by nebulization every 6 (six) hours as needed for wheezing or shortness of breath.    . ALPRAZolam (XANAX) 0.5 MG tablet TAKE 1 TABLET BY MOUTH THREE TIMES A DAY AS NEEDED FOR ANXIETY 90 tablet 0  . doxycycline (VIBRA-TABS) 100 MG tablet Take 1 tablet (100 mg  total) by mouth 2 (two) times daily. 20 tablet 0  . fluticasone (FLONASE) 50 MCG/ACT nasal spray USE 2 SPRAYS IN EACH NOSTRIL EVERY DAY AS NEEDED FOR ALLERGIES AND RHINITIS 16 g 1  . furosemide (LASIX) 40 MG tablet Take 40 mg by mouth.    Marland Kitchen GLUCOCOM LANCETS 33G MISC Use with Test Strips to take blood sugars 100 each 5  . glucose blood (CONTOUR NEXT TEST) test strip USE TO TAKE BLOOD SUGARS TWICE DAILY. 180 each 3  . Insulin Glargine (LANTUS SOLOSTAR) 100 UNIT/ML Solostar Pen INJECT 32 UNITS INTO THE SKIN DAILY AT 10 PM. (Patient taking differently: 22 Units. INJECT 32 UNITS INTO THE SKIN DAILY AT 10 PM.) 3 pen 3  . Insulin Pen Needle (BD PEN NEEDLE NANO U/F) 32G X 4 MM MISC USE TO ADMINISTER LANTUS INSULIN 100 each 3  . nystatin (MYCOSTATIN) 100000 UNIT/ML suspension Take 5 mLs (500,000 Units total) by mouth 4 (four) times daily. 200 mL 0  . pantoprazole (PROTONIX) 40 MG tablet Take 1 tablet (40 mg total) by mouth daily. 30 tablet 0  . PARoxetine (PAXIL) 40 MG tablet TAKE 1 TABLET BY MOUTH EVERY DAY IN THE MORNING 90 tablet 1  . predniSONE (DELTASONE) 10 MG tablet Take 4tabsx3days,3tabsx3days,2tabsx3days,then continue taking 1tab daily 60 tablet 1  . TRELEGY ELLIPTA 100-62.5-25 MCG/INH AEPB TAKE 1 PUFF BY MOUTH EVERY DAY 180 each 1  . triamcinolone cream (KENALOG) 0.1 % Apply 1 application topically 2 (two) times daily. 30 g 0   No current facility-administered medications on file prior to visit.     Past Medical History:  Diagnosis Date  . Arrhythmia   . Bronchiectasis march 2012   On CT chest. RUL. Mild  . CHF (congestive heart failure) ([place])   . Chicken pox   . Chronic diastolic heart failure ([place])   . Chronic respiratory failure ([place])    Followed in Pulmonary clinic/ [facility]/ Ramaswamy  - 06/30/2012 desat to 86%  RA walking 50 ft, recovered to 90% at rest - 06/30/2012  Walked 1lpm x 3 laps @ 185 ft each stopped due to  Sob, no desat  rec 02 2lpm with activity and sleeping, ok  at rest   . COPD (chronic obstructive pulmonary disease) ([place])     FEV-1 in 2008 was 63% with a diffusion capacity of 33%.   . Generalized anxiety disorder   . History of cervical cancer 1982  . Hyperlipidemia   . Hypertension   . Leg swelling    Venous doppler right 07/21/12 >>Neg    . 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 Medical non-compliance   . MITRAL VALVE PROLAPSE   . Obesity, unspecified   . Pulmonary nodule march 2012   54mm RUL and RLL 1st seen march 2012 CT, ?progression 12/2012  CT  . Seasonal allergies   . Tobacco abuse     Smokes one pack a day since age 51.    Past Surgical History:  Procedure Laterality Date  . TOTAL ABDOMINAL HYSTERECTOMY      Social History   Socioeconomic History  . Marital status: Divorced    Spouse name: Not on file  . Number of children: Not on file  . Years of education: Not on file  . Highest education level: Not on file  Occupational History  . Occupation: works at a call center  Social Needs  . Financial resource strain: Not on file  . Food insecurity:    Worry: Not on file    Inability: Not on file  . Transportation needs:    Medical: Not on file    Non-medical: Not on file  Tobacco Use  . Smoking status: Current Every Day Smoker    Packs/day: 0.50    Years: 44.00    Pack years: 22.00    Types: Cigarettes  . Smokeless tobacco: Never Used  . Tobacco comment: 3-4 cigaretters per day 11/20/2017   Substance and Sexual Activity  . Alcohol use: Yes    Alcohol/week: 0.0 standard drinks    Comment: occasional  . Drug use: No  . Sexual activity: Not on file  Lifestyle  . Physical activity:    Days per week: Not on file    Minutes per session: Not on file  . Stress: Not on file  Relationships  . Social connections:    Talks on phone: Not on file    Gets together: Not on file    Attends religious service: Not on file    Active member of club or organization: Not on file    Attends  meetings of clubs or organizations: Not on file    Relationship status: Not on file  Other Topics Concern  . Not on file  Social History Narrative   Married but recently separated from husband    Family History  Problem Relation Age of Onset  . Other Father        MVA  . Esophageal cancer Mother     Review of Systems  Constitutional: Negative for chills and fever.  HENT: Negative for congestion, ear pain and sore throat.   Respiratory: Positive for cough (in the morning and at night - productive in the morning - thicker than usual - not much cough today) and shortness of breath. Negative for wheezing.   Cardiovascular: Positive for leg swelling (mild - LLE> RLE -no swelling in morning). Negative for chest pain and palpitations.  Neurological: Positive for headaches (slight). Negative for light-headedness.       Objective:   Vitals:   01/26/18 1543  BP: 108/60  Pulse: 97  Resp: 20  Temp: 98.4 F (36.9 C)  SpO2: 94%   BP Readings from Last 3 Encounters:  01/26/18 108/60  01/23/18 131/80  01/19/18 138/82   Wt Readings from Last 3 Encounters:  01/26/18 212 lb (96.2 kg)  11/20/17 212 lb 6.4 oz (96.3 kg)  07/01/17 222 lb (100.7 kg)   Body mass index is 36.39 kg/m.   Physical Exam    Constitutional: Appears well-developed and well-nourished. No distress.  HENT:  Head: Normocephalic and atraumatic.  Neck: Neck supple. No tracheal deviation present. No thyromegaly present.  No cervical lymphadenopathy Cardiovascular: Normal rate, regular rhythm and normal heart sounds.   No murmur heard. No carotid bruit .  Trace bilateral lower extremity  nonpitting edema Pulmonary/Chest: Wearing her oxygen.  No respiratory distress.  Diffusely, severely decreased breath sounds bilaterally, possible crackles/wheeze bilateral bases Skin: Skin is warm and dry. Not diaphoretic.  Psychiatric: Normal mood and affect. Behavior is normal.      Assessment & Plan:    See Problem List  for Assessment and Plan of chronic medical problems.

## 2018-01-26 ENCOUNTER — Ambulatory Visit (INDEPENDENT_AMBULATORY_CARE_PROVIDER_SITE_OTHER): Payer: Medicare Other | Admitting: Internal Medicine

## 2018-01-26 ENCOUNTER — Encounter: Payer: Self-pay | Admitting: Internal Medicine

## 2018-01-26 ENCOUNTER — Other Ambulatory Visit: Payer: Medicare Other | Admitting: *Deleted

## 2018-01-26 ENCOUNTER — Ambulatory Visit (INDEPENDENT_AMBULATORY_CARE_PROVIDER_SITE_OTHER)
Admission: RE | Admit: 2018-01-26 | Discharge: 2018-01-26 | Disposition: A | Discharge status: Home / Self Care | Payer: Medicare Other | Source: Ambulatory Visit | Attending: Internal Medicine | Admitting: Internal Medicine

## 2018-01-26 VITALS — BP 108/60 | HR 97 | Temp 98.4°F | Resp 20 | Ht 64.0 in | Wt 212.0 lb

## 2018-01-26 DIAGNOSIS — R059 Cough, unspecified: Secondary | ICD-10-CM

## 2018-01-26 DIAGNOSIS — R05 Cough: Secondary | ICD-10-CM

## 2018-01-26 DIAGNOSIS — J441 Chronic obstructive pulmonary disease with (acute) exacerbation: Secondary | ICD-10-CM

## 2018-01-26 DIAGNOSIS — R0602 Shortness of breath: Secondary | ICD-10-CM

## 2018-01-26 DIAGNOSIS — Z794 Long term (current) use of insulin: Secondary | ICD-10-CM | POA: Diagnosis not present

## 2018-01-26 DIAGNOSIS — F419 Anxiety disorder, unspecified: Secondary | ICD-10-CM

## 2018-01-26 DIAGNOSIS — K219 Gastro-esophageal reflux disease without esophagitis: Secondary | ICD-10-CM | POA: Diagnosis not present

## 2018-01-26 DIAGNOSIS — I10 Essential (primary) hypertension: Secondary | ICD-10-CM | POA: Diagnosis not present

## 2018-01-26 DIAGNOSIS — I5032 Chronic diastolic (congestive) heart failure: Secondary | ICD-10-CM

## 2018-01-26 DIAGNOSIS — Z515 Encounter for palliative care: Secondary | ICD-10-CM

## 2018-01-26 DIAGNOSIS — E119 Type 2 diabetes mellitus without complications: Secondary | ICD-10-CM | POA: Diagnosis not present

## 2018-01-26 MED ORDER — ALPRAZOLAM 0.5 MG PO TABS: ORAL_TABLET | ORAL | 0 refills | Status: DC

## 2018-01-26 MED ORDER — PANTOPRAZOLE SODIUM 40 MG PO TBEC: 40.0000 mg | DELAYED_RELEASE_TABLET | Freq: Every day | ORAL | 3 refills | Status: DC

## 2018-01-26 MED ORDER — PAROXETINE HCL 40 MG PO TABS: ORAL_TABLET | ORAL | 1 refills | Status: DC

## 2018-01-26 NOTE — Progress Notes (Signed)
COMMUNITY PALLIATIVE CARE RN NOTE  PATIENT NAME: [name] DOB: January 21, 1946 MRN: [id number]  PRIMARY CARE PROVIDER: Binnie Rail, MD  RESPONSIBLE PARTY:  Acct ID - Guarantor Home Phone Work Phone Relationship Acct Type  [id number] - Klepacki,Bellami [phone]  Self P/F     [address], [place], [place] [address]    PLAN OF CARE and INTERVENTION:  1. ADVANCE CARE PLANNING/GOALS OF CARE: Wants to remain at home and avoid hospitalizations. She is a Full Code 2. PATIENT/CAREGIVER EDUCATION: Reinforced Safe Mobility and Energy Conservation 3. DISEASE STATUS: Met with patient in her home. She denies pain. Her breathing has slightly improved. She contacted her PCP yesterday to advise of congestion and dyspnea. MD placed patient on Doxycycline '100mg'$  po BID for 10 days. She is also on a Prednisone taper. Patient reports that she is starting to feel better and congestion is starting to break up. She remains on Oxygen at 7L/min via [place]. Her sats are better today at 94% vs 88% on 7L/min 4 days ago. She continues with dyspnea with exertion. She is currently out of her Xanax and states that the Pharmacy says that it is too early to refill. Occasional congested cough. Her caregiver was present to help with household chores. Patient was very pleasant and conversational. Her intake is normal. She continues to receive Meals on Wheels. Continues to utilize bedside commode to conserve energy. She is back taking her Lasix as prescribed. No edema noted. Will continue to monitor.   HISTORY OF PRESENT ILLNESS:  [name] who resides at home alone. She has hired caregivers 2-3 times a week for a few hours. Palliative Care Team continues to follow patient. Will continue to visit monthly and PRN.  CODE STATUS: FULL Code ADVANCED DIRECTIVES: N MOST FORM: yes PPS: 50%   PHYSICAL EXAM:   VITALS: Today's Vitals   01/23/18 1238  BP: 131/80  Pulse: 93  Resp: 20  Temp: 97.9 F (36.6 C)  TempSrc:  Temporal  SpO2: 94%  PainSc: 0-No pain    LUNGS: decreased breath sounds CARDIAC: Cor RRR EXTREMITIES: No edema SKIN: Exposed skin is dry and intact  NEURO: Alert and oriented x 3, pleasant mood, generalized weakness, ambulatory with walker   (Duration of visit and documentation 75 minutes)    Daryl Eastern, RN, BSN

## 2018-01-26 NOTE — Assessment & Plan Note (Signed)
Increased cough and shortness of breath starting last week-consistent with COPD exacerbation Continue prednisone taper and antibiotic-did feel better first couple of days and oxygen saturation and cough have improved She still feels short of breath, which could be related to untreated anxiety Chest x-ray today Continue to monitor oxygen saturation We will get her back on her anxiety medications and hopefully that will help

## 2018-01-26 NOTE — Assessment & Plan Note (Signed)
Trace bilateral ankle edema-none in the morning and gets worse as the day goes on Likely related to prednisone-not necessarily heart failure Also having COPD exacerbation, which is likely contributing Monitor edema Continue Lasix 40 mg daily

## 2018-01-26 NOTE — Assessment & Plan Note (Signed)
Sugars well controlled at home Continue to monitor, especially given chronic prednisone use We will hold off on blood work today

## 2018-01-26 NOTE — Patient Instructions (Signed)
Have chest x-ray today.   Continue the doxycycline and prednisone.     Pick up the xanax and start taking that.

## 2018-01-26 NOTE — Assessment & Plan Note (Signed)
GERD controlled Continue daily medication  

## 2018-01-26 NOTE — Assessment & Plan Note (Signed)
Ran out of Paxil-?  Why Also run out of alprazolam, which is likely related to using 1 mg pills because of a shortage at the pharmacy and not being able to cut them accurately Due for refill for alprazolam tomorrow-send note to pharmacy okay to fill day early Anxiety possibly causing increased shortness of breath-she will let me know if it does not improve with restarting the alprazolam today

## 2018-01-27 NOTE — Progress Notes (Signed)
COMMUNITY PALLIATIVE CARE RN NOTE  PATIENT NAME: [name] DOB: [birth year] MRN: [id number]  PRIMARY CARE PROVIDER: Binnie Rail, MD  RESPONSIBLE PARTY:  Acct ID - Guarantor Home Phone Work Phone Relationship Acct Type  [id number] - Kea,Dawnisha [phone]  Self P/F     [address], [place], [place] [address]    PLAN OF CARE and INTERVENTION:  1. ADVANCE CARE PLANNING/GOALS OF CARE: She wants to remain in her home and wishes to breathe better 2. PATIENT/CAREGIVER EDUCATION: Reinforced Energy conservation, Breathing Techniques and Safe Mobility 3. DISEASE STATUS: Met with patient in her home. Patient requested PRN RN visit. She is sitting on her walker. She just recently transferred from her wheelchair to her walker and became out of breath prior to my arrival. She feels her breathing is worsening with any exertion and could only speak about 2-3 words at a time between breaths. She had a difficult time ambulating to the bathroom and getting dressed today. Earlier this am, she states waking up and feeling as if she was going to die. She contacted EMS earlier today and they recommended that she not go to the ER, but that she keep her appointment with her PCP today, which is at 3:30p. Her Oxygen sat is 95% on 7L/min via [place]. This RN feels that part of patient's issue is the fact that she has run out of her Xanax. She states that it will be ready for refill and pick up tomorrow, but she is going to ask PCP if they can refill today. She states having a "rough" weekend. She continues on Doxycline and Prednisone taper for suspected respiratory infection. She did calm by the end of this RNs visit. She continues to smoke, which she states calms her anxiety. Will continue to monitor.   HISTORY OF PRESENT ILLNESS:  This is a 74 yo female who resides at home alone. Palliative Care Team continues to follow patient. Will continue to visit monthly and PRN.  CODE STATUS: FULL Code ADVANCED DIRECTIVES:  N MOST FORM: yes PPS: 50%   PHYSICAL EXAM:   VITALS: Today's Vitals   01/26/18 1348  BP: 119/78  Pulse: (!) 102  Resp: (!) 26  Temp: 97.8 F (36.6 C)  TempSrc: Temporal  SpO2: 95%  PainSc: 0-No pain    LUNGS: clear to auscultation  CARDIAC: Cor RRR EXTREMITIES: No edema SKIN: Skin color, texture, turgor normal. No rashes or lesions  NEURO: Alert and oriented x 3, increased generalized weakness, ambulatory with walker   (Duration of visit and documentation 90 minutes)    Daryl Eastern, RN, BSN

## 2018-01-29 ENCOUNTER — Telehealth: Payer: Self-pay | Admitting: Internal Medicine

## 2018-01-29 ENCOUNTER — Telehealth: Payer: Self-pay | Admitting: Licensed Clinical Social Worker

## 2018-01-29 ENCOUNTER — Ambulatory Visit: Payer: Medicare Other | Admitting: Gastroenterology

## 2018-01-29 NOTE — Telephone Encounter (Signed)
Copied from [place] [id number]. Topic: General - Inquiry >> Jan 29, 2018  4:21 PM Margot Ables wrote: Reason for CRM: pt calling to f/u on chest xray results. Please call back to advise.

## 2018-01-29 NOTE — Telephone Encounter (Signed)
SW received a message from the [place]., requesting a call to the patient.  SW left a vm.

## 2018-01-29 NOTE — Telephone Encounter (Signed)
Call x-ray -- xray result not posted - ? Xray read?  -

## 2018-01-29 NOTE — Telephone Encounter (Signed)
Patient returned SW phone call.  Informed her a volunteer has not been assigned yet and she said she understood.  She reported having increased energy since she received her anti-anxiety medication.

## 2018-01-29 NOTE — Telephone Encounter (Signed)
SW left patient another vm requesting a call back.

## 2018-01-30 DIAGNOSIS — R0602 Shortness of breath: Secondary | ICD-10-CM | POA: Diagnosis not present

## 2018-01-30 NOTE — Telephone Encounter (Signed)
Spoke with Mardene Celeste from xray and she states that she is going to send the report over stat and the report should be back in a few minutes.

## 2018-02-05 ENCOUNTER — Other Ambulatory Visit: Payer: Self-pay | Admitting: *Deleted

## 2018-02-05 NOTE — Patient Outreach (Signed)
[facility]) Care Management  02/05/2018  [name] [birth year] [id number]   RN Health Coach Quarterly Outreach  Referral Date:  11/03/2017 Referral Source:  MD Referral Reason for Referral:  Housing Resources & Disease Management Education Insurance:  Medicare   Outreach Attempt:  Outreach attempt #1 to patient for quarterly follow up. No answer. RN Health Coach left HIPAA compliant voicemail message along with contact information.  Plan:  RN Health Coach will make another outreach attempt within the month of January.   [place] Coach [phone] [email or web address]

## 2018-02-17 ENCOUNTER — Other Ambulatory Visit: Payer: Self-pay | Admitting: Internal Medicine

## 2018-02-17 MED ORDER — INSULIN GLARGINE 100 UNIT/ML SOLOSTAR PEN: PEN_INJECTOR | SUBCUTANEOUS | 1 refills | Status: DC

## 2018-02-17 NOTE — Telephone Encounter (Signed)
Requested Prescriptions  Pending Prescriptions Disp Refills  . Insulin Glargine (LANTUS SOLOSTAR) 100 UNIT/ML Solostar Pen 30 mL 1    Sig: INJECT 32 UNITS INTO THE SKIN DAILY AT 10 PM.     Endocrinology:  Diabetes - Insulins Failed - 02/17/2018  2:35 PM      Failed - HBA1C is between 0 and 7.9 and within 180 days    Hgb A1c MFr Bld  Date Value Ref Range Status  07/01/2017 6.5 4.6 - 6.5 % Final    Comment:    Glycemic Control Guidelines for People with Diabetes:Non Diabetic:  <6%Goal of Therapy: <7%Additional Action Suggested:  >8%          Passed - Valid encounter within last 6 months    Recent Outpatient Visits          3 weeks ago SOB (shortness of breath)   [place], MD   1 month ago Chronic diastolic heart failure ([place])   [place], Claudina Lick, MD   7 months ago Essential hypertension   [facility], Claudina Lick, MD   1 year ago Essential hypertension   [place], Claudina Lick, MD   1 year ago Essential hypertension   [facility] -[facility], Claudina Lick, MD

## 2018-02-17 NOTE — Telephone Encounter (Signed)
Copied from [place] [id number]. Topic: Quick Communication - Rx Refill/Question >> Feb 17, 2018  1:57 PM [name] wrote: Medication: Insulin Glargine (LANTUS SOLOSTAR) 100 UNIT/ML Solostar Pen  Has the patient contacted their pharmacy? Yes.   (Agent: If no, request that the patient contact the pharmacy for the refill.) (Agent: If yes, when and what did the pharmacy advise?) Patient picked up her prescription and she is confused as to why she only received 3 pens, she usually gets a 90 day supply which is 3 boxes and 5 pens in each. The pharmacy states that the prescription was changed she would like to know why this was changed please advise  Preferred Pharmacy (with phone number or street name): [id number] [name], [place] [phone] (Phone) [phone] (Fax)    Agent: Please be advised that RX refills may take up to 3 business days. We ask that you follow-up with your pharmacy.

## 2018-02-18 ENCOUNTER — Other Ambulatory Visit: Payer: Self-pay | Admitting: *Deleted

## 2018-02-18 NOTE — Patient Outreach (Signed)
[facility]) Care Management  02/18/2018  [name] November 11, 1945 [id number]   [place] Quarterly Outreach  Referral Date: 11/03/2017 Referral Source: MD Referral Reason for Referral: Housing Resources & Disease Management Education Insurance:Medicare   Outreach Attempt:  Outreach attempt #2 to patient for quarterly follow up. No answer. RN Health Coach left HIPAA compliant voicemail message along with contact information.  Plan:  RN Health Coach will make another outreach attempt within the month of February.  [place] [phone] [email or web address] [email or web address]

## 2018-02-26 ENCOUNTER — Other Ambulatory Visit: Payer: Self-pay | Admitting: Internal Medicine

## 2018-02-26 NOTE — Telephone Encounter (Signed)
Last refill was 01/26/18 Last OV 01/05/18 Next OV not made

## 2018-03-16 ENCOUNTER — Other Ambulatory Visit: Payer: Self-pay | Admitting: *Deleted

## 2018-03-16 ENCOUNTER — Encounter: Payer: Self-pay | Admitting: *Deleted

## 2018-03-16 NOTE — Patient Outreach (Signed)
[facility]) Care Management  [facility] Care Manager  03/16/2018   [name] [birth year] [id number]   [place] Quarterly Outreach   Referral Date:  11/03/2017 Referral Source:  MD Referral Reason for Referral:  Housing Resources & Disease Management Education Insurance:  Medicare    Outreach Attempt:  Successful telephone outreach to patient for quarterly follow up.  HIPAA verified with patient.  Patient stating she is feeling better than last month when she had COPD exacerbation treated with prednisone taper and antibiotics.  Continues to be short of breath but feels it is related to being deconditioned.  Reports use of her rescue inhaler about daily.  Continues to wear home oxygen at 5 l/m.  Patient also reporting increased anxiety to going out of her home into crowds alone.  States she had an eye doctor's appointment this morning and as soon as she woke up started to feel so much anxiety that she cancelled her [employer] transportation and her appointment.  Reports compliance with her Xanax and Paxil, just feels like she has a fear of leaving her home and some depression; she feels like she has "agoraphobia".  Has a home health aide who can not go with her to appointments due it being against agency policy.  Reporting she feels alone and does not get any visitors.  Discussed [name] Clinical Social Work referral and patient verbally agrees.  Reports she does not weigh herself daily.  Discussed importance of daily weight monitoring and when to call the physician based on weight.  Fasting blood sugar this morning was 142 with fasting ranges of 90-120's.  Last Hgb A1C was 6.5 in 06/2017.  Patient is asking about home physical therapy.  Encouraged patient to speak with primary care provider or Palliative Care about ordering home health physical therapy to help with strength and endurance.  Encounter Medications:  Outpatient Encounter Medications as of 03/16/2018  Medication Sig Note  .  albuterol (PROVENTIL HFA;VENTOLIN HFA) 108 (90 Base) MCG/ACT inhaler Inhale 2 puffs into the lungs every 6 (six) hours as needed for wheezing or shortness of breath.   Marland Kitchen albuterol (PROVENTIL) (2.5 MG/3ML) 0.083% nebulizer solution Take 2.5 mg by nebulization every 6 (six) hours as needed for wheezing or shortness of breath.   . ALPRAZolam (XANAX) 0.5 MG tablet TAKE 1 TABLET BY MOUTH THREE TIMES A DAY AS NEEDED FOR ANXIETY   . fluticasone (FLONASE) 50 MCG/ACT nasal spray USE 2 SPRAYS IN EACH NOSTRIL EVERY DAY AS NEEDED FOR ALLERGIES AND RHINITIS   . furosemide (LASIX) 40 MG tablet Take 40 mg by mouth.   Marland Kitchen GLUCOCOM LANCETS 33G MISC Use with Test Strips to take blood sugars   . glucose blood (CONTOUR NEXT TEST) test strip USE TO TAKE BLOOD SUGARS TWICE DAILY.   Marland Kitchen Insulin Glargine (LANTUS SOLOSTAR) 100 UNIT/ML Solostar Pen INJECT 32 UNITS INTO THE SKIN DAILY AT 10 PM. 03/16/2018: Decreased to 22 units nightly  . Insulin Pen Needle (BD PEN NEEDLE NANO U/F) 32G X 4 MM MISC USE TO ADMINISTER LANTUS INSULIN   . nystatin (MYCOSTATIN) 100000 UNIT/ML suspension Take 5 mLs (500,000 Units total) by mouth 4 (four) times daily. 11/27/2017: Takes as needed  . pantoprazole (PROTONIX) 40 MG tablet Take 1 tablet (40 mg total) by mouth daily.   Marland Kitchen PARoxetine (PAXIL) 40 MG tablet TAKE 1 TABLET BY MOUTH EVERY DAY IN THE MORNING   . predniSONE (DELTASONE) 10 MG tablet Take 4tabsx3days,3tabsx3days,2tabsx3days,then continue taking 1tab daily   . TRELEGY  ELLIPTA 100-62.5-25 MCG/INH AEPB TAKE 1 PUFF BY MOUTH EVERY DAY   . triamcinolone cream (KENALOG) 0.1 % Apply 1 application topically 2 (two) times daily.   Marland Kitchen doxycycline (VIBRA-TABS) 100 MG tablet Take 1 tablet (100 mg total) by mouth 2 (two) times daily. (Patient not taking: Reported on 03/16/2018) 03/16/2018: completed   No facility-administered encounter medications on file as of 03/16/2018.     Functional Status:  In your present state of health, do you have any  difficulty performing the following activities: 11/27/2017  Hearing? N  Vision? N  Difficulty concentrating or making decisions? N  Walking or climbing stairs? Y  Dressing or bathing? Y  Doing errands, shopping? Y  Preparing Food and eating ? Y  Using the Toilet? N  In the past six months, have you accidently leaked urine? Y  Do you have problems with loss of bowel control? N  Managing your Medications? N  Managing your Finances? N  Housekeeping or managing your Housekeeping? Y  Some recent data might be hidden    Fall/Depression Screening: Fall Risk  03/16/2018 11/27/2017 06/24/2016  Falls in the past year? 1 1 No  Comment no falls since October 2019 - -  Number falls in past yr: 1 1 -  Injury with Fall? 0 0 -  Risk for fall due to : History of fall(s);Impaired vision;Medication side effect;Impaired balance/gait;Impaired mobility Impaired vision;Impaired balance/gait;Medication side effect;Impaired mobility -  Follow up Falls evaluation completed;Falls prevention discussed;Education provided Falls prevention discussed;Education provided -   [facility] 2/9 Scores 11/27/2017 11/03/2017 06/24/2016 01/27/2015 07/12/2013 09/14/2012 08/26/2012  PHQ - 2 Score 0 2 0 2 2 1 1   PHQ- 9 Score - 12 - 5 13 - -   [name] CM Care Plan Problem One     Most Recent Value  Care Plan Problem One  Knowledge deficiet related to self care of COPD and CHF.  Role Documenting the Problem One  [place] for Problem One  Active  [facility] Long Term Goal   Patient will report weighing herself daily in the next 90 days.  [name] Long Term Goal Start Date  03/16/18  Interventions for Problem One Long Term Goal  Current care plan and goals reviewed and discussed with patient, reviewed medications and indications and encouraged medication compliance, discussed importance of daily weight monitoring, discussed when to call the physician based on weight, encouraged patient to contact primary care to discuss ordering home health therapy  to help with stregnth and endurance, fall precautions and preventions reviewed and discussed, encouraged patient to keep and attend scheduled medical appointments  [name] CM Short Term Goal #1   Patient will report a decrease in anxiety or agoraphobia in the next 30 days.  [name] CM Short Term Goal #1 Start Date  03/16/18  Interventions for Short Term Goal #1  Discussed patient's anxiety and fear of leaving the home alone, confirmed patient is taking prescribed paxil and xanax, [facility] Social Work referral for anxiety, encouraged patient to discuss anxiety and fears with primary care provider and palliative care team     Appointments:  Attended appointment with Dr. Quay Burow on 01/26/2018 needs to schedule follow up appointment.  Patient encouraged to schedule follow up appointment with primary care provider.  Reports she continues to be followed by Palliative Care services and they are scheduled to come out this Thursday.  Encouraged to rescheduled missed eye examination.  Plan: RN Health Coach will send primary care provider quarterly update. RN Health Coach  will send [facility] SW referral for possible assistance with community resources related to anxiety/depression/agoraphobia. RN Health Coach will make next telephone outreach to patient in the month of March.  [place] [phone] [email or web address]

## 2018-03-17 ENCOUNTER — Telehealth: Payer: Self-pay | Admitting: Internal Medicine

## 2018-03-17 DIAGNOSIS — R5381 Other malaise: Secondary | ICD-10-CM

## 2018-03-17 NOTE — Telephone Encounter (Signed)
Copied from [place] [id number]. Topic: Quick Communication - See Telephone Encounter >> Mar 17, 2018 12:29 PM Valla Leaver wrote: CRM for notification. See Telephone encounter for: 03/17/18. Patient would like an order for PT with [facility] placed for lack of energy and weakness trying to get around.

## 2018-03-17 NOTE — Telephone Encounter (Signed)
ordered

## 2018-03-17 NOTE — Telephone Encounter (Signed)
Please advise 

## 2018-03-18 ENCOUNTER — Other Ambulatory Visit: Payer: Medicare Other | Admitting: *Deleted

## 2018-03-18 DIAGNOSIS — Z515 Encounter for palliative care: Secondary | ICD-10-CM

## 2018-03-18 MED ORDER — PREDNISONE 10 MG PO TABS: ORAL_TABLET | ORAL | 1 refills | Status: DC

## 2018-03-18 MED ORDER — DOXYCYCLINE HYCLATE 100 MG PO TABS: 100.0000 mg | ORAL_TABLET | Freq: Two times a day (BID) | ORAL | 0 refills | Status: DC

## 2018-03-18 NOTE — Telephone Encounter (Signed)
I will send in doxycycline and a prednisone taper-if her symptoms do not improve she needs to be seen here or by pulmonary

## 2018-03-18 NOTE — Telephone Encounter (Signed)
Pt aware. Pt asked if you would send in a round of antibiotics because she is having a flare up of COPD

## 2018-03-18 NOTE — Telephone Encounter (Signed)
Pt aware.

## 2018-03-18 NOTE — Progress Notes (Signed)
COMMUNITY PALLIATIVE CARE RN NOTE  PATIENT NAME: [name] DOB: [birth year] MRN: [id number]  PRIMARY CARE PROVIDER: Binnie Rail, MD  RESPONSIBLE PARTY:  Acct ID - Guarantor Home Phone Work Phone Relationship Acct Type  [id number] - Kauth,Amaal [phone]  Self P/F     [address], [place], [place] [address]    PLAN OF CARE and INTERVENTION:  1. ADVANCE CARE PLANNING/GOALS OF CARE: Goal is for patient to remain in her home. She is a Full Code 2. PATIENT/CAREGIVER EDUCATION: Reinforced Safe Mobility, Breathing Techniques and Anxiety Reduction 3. DISEASE STATUS: Met with patient in her home. She is awake and alert, pleasant and engaging. She denies pain. She continues to experience dyspnea during conversation, but worsens with exertion. She requires frequent rest periods between activities. She reports that she is waking up with increased anxiety the mornings that she has some type of MD appointment scheduled. She has had to cancel several eye appointments due to this issue. She continues with inhalers and Xanax, which remain helpful. She continues to smoke. She is O2 dependent. Oxygen is at 6L via [place]. She has a productive cough. Phlegm is mainly clear. She remains able to perform ADLs independently, but is done in increments d/t dyspnea. She feels that may possible need more Physical Therapy and will discuss this with her PCP. She continues with a home health aide to assist with personal care and light household chores. Her intake is fair. Next week she is scheduled to be fitted for new top dentures, which she looks forward to and feels this will help her to eat better. No edema noted. Will continue to monitor.  HISTORY OF PRESENT ILLNESS:  [name] who resides at home alone. Palliative Care Team continues to follow patient. Next visit scheduled in 1 month.  CODE STATUS: Full Code ADVANCED DIRECTIVES: N MOST FORM: yes PPS: 50%   PHYSICAL EXAM:   VITALS: Today's  Vitals   03/18/18 1244  BP: 119/69  Pulse: (!) 102  Resp: 20  Temp: 97.6 F (36.4 C)  TempSrc: Temporal  SpO2: 92%  PainSc: 0-No pain    LUNGS: decreased breath sounds CARDIAC: Cor RRR EXTREMITIES: No edema SKIN: Skin color, texture, turgor normal. No rashes or lesions  NEURO: Alert and oriented x 3, pleasant mood, generalized weakness, ambulatory with walker   (Duration of visit and documentation 75 minutes)    Daryl Eastern, RN, BSN

## 2018-03-20 DIAGNOSIS — Z7952 Long term (current) use of systemic steroids: Secondary | ICD-10-CM | POA: Diagnosis not present

## 2018-03-20 DIAGNOSIS — J9610 Chronic respiratory failure, unspecified whether with hypoxia or hypercapnia: Secondary | ICD-10-CM | POA: Diagnosis not present

## 2018-03-20 DIAGNOSIS — Z794 Long term (current) use of insulin: Secondary | ICD-10-CM | POA: Diagnosis not present

## 2018-03-20 DIAGNOSIS — J441 Chronic obstructive pulmonary disease with (acute) exacerbation: Secondary | ICD-10-CM | POA: Diagnosis not present

## 2018-03-20 DIAGNOSIS — E119 Type 2 diabetes mellitus without complications: Secondary | ICD-10-CM | POA: Diagnosis not present

## 2018-03-20 DIAGNOSIS — I5032 Chronic diastolic (congestive) heart failure: Secondary | ICD-10-CM | POA: Diagnosis not present

## 2018-03-20 DIAGNOSIS — Z9981 Dependence on supplemental oxygen: Secondary | ICD-10-CM | POA: Diagnosis not present

## 2018-03-20 DIAGNOSIS — F17210 Nicotine dependence, cigarettes, uncomplicated: Secondary | ICD-10-CM | POA: Diagnosis not present

## 2018-03-20 DIAGNOSIS — I110 Hypertensive heart disease with heart failure: Secondary | ICD-10-CM | POA: Diagnosis not present

## 2018-03-23 ENCOUNTER — Telehealth: Payer: Self-pay | Admitting: Internal Medicine

## 2018-03-23 NOTE — Telephone Encounter (Signed)
Copied from [place] [id number]. Topic: Quick Communication - Home Health Verbal Orders >> Mar 23, 2018  4:10 PM Vernona Rieger wrote: Caller/AgencyPenni Bombard, physical therapist with [facility] Number: [phone] Requesting OT/PT/Skilled Nursing/Social Work: physical therapy  Frequency: 2 week 4 & 1 week 4  She wanted to let Dr Quay Burow know she has a home health aid coming in to help cook and bath her. She said she comes twice a week for four hours. She was unsure if Dr Quay Burow knew this.

## 2018-03-23 NOTE — Telephone Encounter (Signed)
Gave ok for verbal orders.  

## 2018-03-26 ENCOUNTER — Encounter: Payer: Self-pay | Admitting: *Deleted

## 2018-03-26 ENCOUNTER — Other Ambulatory Visit: Payer: Self-pay | Admitting: *Deleted

## 2018-03-26 DIAGNOSIS — I110 Hypertensive heart disease with heart failure: Secondary | ICD-10-CM | POA: Diagnosis not present

## 2018-03-26 DIAGNOSIS — I5032 Chronic diastolic (congestive) heart failure: Secondary | ICD-10-CM | POA: Diagnosis not present

## 2018-03-26 DIAGNOSIS — J9610 Chronic respiratory failure, unspecified whether with hypoxia or hypercapnia: Secondary | ICD-10-CM | POA: Diagnosis not present

## 2018-03-26 DIAGNOSIS — J441 Chronic obstructive pulmonary disease with (acute) exacerbation: Secondary | ICD-10-CM | POA: Diagnosis not present

## 2018-03-26 DIAGNOSIS — F17210 Nicotine dependence, cigarettes, uncomplicated: Secondary | ICD-10-CM | POA: Diagnosis not present

## 2018-03-26 DIAGNOSIS — E119 Type 2 diabetes mellitus without complications: Secondary | ICD-10-CM | POA: Diagnosis not present

## 2018-03-26 NOTE — Patient Outreach (Signed)
[facility]) Care Management  03/26/2018  Sanvi Ehler Jul 19, 1945 [id number]   CSW was able to make initial contact with patient today to perform phone assessment, as well as assess and assist with social work needs and services.  CSW introduced self, explained role and types of services provided through [facility] ([place] [facility]).  CSW further explained to patient that CSW works with patient's Telephonic RNCM, also with [facility], Hubert Azure. CSW then explained the reason for the call, indicating that Ms. Tarpley thought that patient would benefit from social work services and resources to assist with counseling and supportive services for symptoms of anxiety, depression and agoraphobia.  CSW obtained two HIPAA compliant identifiers from patient, which included patient's name and date of birth.  Patient admitted to being interested in receiving counseling and supportive services for symptoms of anxiety and depression, but indicated that it is imperative that she receive these services in the home.  Patient reported that she is too fearful of leaving her home to attend any type of appointment, having recently cancelled an opthalmologist appointment due to severe anxiety.  Patient stated that she suffers from agoraphobia and becomes extremely anxious when she is exposed to strangers or in the midst of a crowd of people.  Patient was not interested in having CSW schedule a home visit with her, as CSW explained to patient that counseling and supportive services through [facility] would only be temporary, at least until CSW is able to get patient established with a long-term counselor in the community.  Patient was agreeable to having CSW provide her with a list of in-home therapists that accept Traditional Medicare.  This list included all of the following agencies: [facility] - # [phone] [facility] - #  [phone] [facility] - # [phone] [facility] - # [phone] [facility] - # [phone] Patient reported that she would like to be the one to actually make contact with the different agencies, to ask specific questions, as well as discuss the billing process.  CSW voiced understanding and was agreeable to this plan.  Patient denied having any additional social work needs or requests at present.  CSW will perform a case closure on patient, as all goals of treatment have been met from social work standpoint and no additional social work needs have been identified at this time.  CSW will notify patient's Telephonic RNCM with [place] [facility], Hubert Azure of CSW's plans to close patient's case.  CSW will fax an update to patient's Primary Care Physician, Dr. Billey Gosling to ensure that they are aware of CSW's involvement with patient's plan of care.  CSW was able to ensure that patient has the correct contact information for CSW, encouraging patient to contact CSW directly if additional social work needs arise in the near future.  Nat Christen, BSW, MSW, LCSW  Licensed Education officer, [facility]  Mailing [place] [address], [place], [place] [address] Physical Address-[address], [place], [place] [address] Toll Free Main # [phone] Fax # [phone] Cell # [phone]  Office # [phone] [email or web address]

## 2018-03-27 ENCOUNTER — Other Ambulatory Visit: Payer: Self-pay | Admitting: Internal Medicine

## 2018-03-27 DIAGNOSIS — I5032 Chronic diastolic (congestive) heart failure: Secondary | ICD-10-CM | POA: Diagnosis not present

## 2018-03-27 DIAGNOSIS — J9610 Chronic respiratory failure, unspecified whether with hypoxia or hypercapnia: Secondary | ICD-10-CM | POA: Diagnosis not present

## 2018-03-27 DIAGNOSIS — F17210 Nicotine dependence, cigarettes, uncomplicated: Secondary | ICD-10-CM | POA: Diagnosis not present

## 2018-03-27 DIAGNOSIS — E119 Type 2 diabetes mellitus without complications: Secondary | ICD-10-CM | POA: Diagnosis not present

## 2018-03-27 DIAGNOSIS — J441 Chronic obstructive pulmonary disease with (acute) exacerbation: Secondary | ICD-10-CM | POA: Diagnosis not present

## 2018-03-27 DIAGNOSIS — I110 Hypertensive heart disease with heart failure: Secondary | ICD-10-CM | POA: Diagnosis not present

## 2018-03-27 NOTE — Telephone Encounter (Signed)
[place] Controlled Database Checked Last filled: 02/26/18 # 90 LOV w/you: 01/26/18  Next appt w/you: None

## 2018-03-30 DIAGNOSIS — H25813 Combined forms of age-related cataract, bilateral: Secondary | ICD-10-CM | POA: Diagnosis not present

## 2018-03-30 DIAGNOSIS — H52203 Unspecified astigmatism, bilateral: Secondary | ICD-10-CM | POA: Diagnosis not present

## 2018-03-30 DIAGNOSIS — H5213 Myopia, bilateral: Secondary | ICD-10-CM | POA: Diagnosis not present

## 2018-03-30 DIAGNOSIS — E119 Type 2 diabetes mellitus without complications: Secondary | ICD-10-CM | POA: Diagnosis not present

## 2018-03-30 LAB — HM DIABETES EYE EXAM

## 2018-03-31 DIAGNOSIS — J441 Chronic obstructive pulmonary disease with (acute) exacerbation: Secondary | ICD-10-CM | POA: Diagnosis not present

## 2018-03-31 DIAGNOSIS — F17210 Nicotine dependence, cigarettes, uncomplicated: Secondary | ICD-10-CM | POA: Diagnosis not present

## 2018-03-31 DIAGNOSIS — I5032 Chronic diastolic (congestive) heart failure: Secondary | ICD-10-CM | POA: Diagnosis not present

## 2018-03-31 DIAGNOSIS — J9610 Chronic respiratory failure, unspecified whether with hypoxia or hypercapnia: Secondary | ICD-10-CM | POA: Diagnosis not present

## 2018-03-31 DIAGNOSIS — E119 Type 2 diabetes mellitus without complications: Secondary | ICD-10-CM | POA: Diagnosis not present

## 2018-03-31 DIAGNOSIS — I110 Hypertensive heart disease with heart failure: Secondary | ICD-10-CM | POA: Diagnosis not present

## 2018-04-02 ENCOUNTER — Telehealth: Payer: Self-pay | Admitting: *Deleted

## 2018-04-02 DIAGNOSIS — E119 Type 2 diabetes mellitus without complications: Secondary | ICD-10-CM | POA: Diagnosis not present

## 2018-04-02 DIAGNOSIS — J9610 Chronic respiratory failure, unspecified whether with hypoxia or hypercapnia: Secondary | ICD-10-CM | POA: Diagnosis not present

## 2018-04-02 DIAGNOSIS — F17210 Nicotine dependence, cigarettes, uncomplicated: Secondary | ICD-10-CM | POA: Diagnosis not present

## 2018-04-02 DIAGNOSIS — I5032 Chronic diastolic (congestive) heart failure: Secondary | ICD-10-CM | POA: Diagnosis not present

## 2018-04-02 DIAGNOSIS — J441 Chronic obstructive pulmonary disease with (acute) exacerbation: Secondary | ICD-10-CM | POA: Diagnosis not present

## 2018-04-02 DIAGNOSIS — I110 Hypertensive heart disease with heart failure: Secondary | ICD-10-CM | POA: Diagnosis not present

## 2018-04-02 NOTE — Telephone Encounter (Signed)
Contacted patient to arrange a home visit. Visit scheduled for 04/09/18 at 12:00p. She informed me that she has started working with Physical Therapy. She had a session today where she was able to tolerate walking outside. PT visiting on Tuesdays and Thursdays.

## 2018-04-07 DIAGNOSIS — F17210 Nicotine dependence, cigarettes, uncomplicated: Secondary | ICD-10-CM | POA: Diagnosis not present

## 2018-04-07 DIAGNOSIS — J441 Chronic obstructive pulmonary disease with (acute) exacerbation: Secondary | ICD-10-CM | POA: Diagnosis not present

## 2018-04-07 DIAGNOSIS — J9610 Chronic respiratory failure, unspecified whether with hypoxia or hypercapnia: Secondary | ICD-10-CM | POA: Diagnosis not present

## 2018-04-07 DIAGNOSIS — I110 Hypertensive heart disease with heart failure: Secondary | ICD-10-CM | POA: Diagnosis not present

## 2018-04-07 DIAGNOSIS — E119 Type 2 diabetes mellitus without complications: Secondary | ICD-10-CM | POA: Diagnosis not present

## 2018-04-07 DIAGNOSIS — I5032 Chronic diastolic (congestive) heart failure: Secondary | ICD-10-CM | POA: Diagnosis not present

## 2018-04-08 ENCOUNTER — Other Ambulatory Visit: Payer: Self-pay | Admitting: Internal Medicine

## 2018-04-09 ENCOUNTER — Other Ambulatory Visit: Payer: Self-pay

## 2018-04-09 ENCOUNTER — Other Ambulatory Visit: Payer: Medicare Other | Admitting: *Deleted

## 2018-04-09 ENCOUNTER — Other Ambulatory Visit: Payer: Medicare Other | Admitting: Licensed Clinical Social Worker

## 2018-04-09 DIAGNOSIS — J9610 Chronic respiratory failure, unspecified whether with hypoxia or hypercapnia: Secondary | ICD-10-CM | POA: Diagnosis not present

## 2018-04-09 DIAGNOSIS — Z515 Encounter for palliative care: Secondary | ICD-10-CM

## 2018-04-09 DIAGNOSIS — J441 Chronic obstructive pulmonary disease with (acute) exacerbation: Secondary | ICD-10-CM | POA: Diagnosis not present

## 2018-04-09 DIAGNOSIS — F17210 Nicotine dependence, cigarettes, uncomplicated: Secondary | ICD-10-CM | POA: Diagnosis not present

## 2018-04-09 DIAGNOSIS — I5032 Chronic diastolic (congestive) heart failure: Secondary | ICD-10-CM | POA: Diagnosis not present

## 2018-04-09 DIAGNOSIS — E119 Type 2 diabetes mellitus without complications: Secondary | ICD-10-CM | POA: Diagnosis not present

## 2018-04-09 DIAGNOSIS — I110 Hypertensive heart disease with heart failure: Secondary | ICD-10-CM | POA: Diagnosis not present

## 2018-04-09 NOTE — Progress Notes (Signed)
COMMUNITY PALLIATIVE CARE SW NOTE  PATIENT NAME: [name] DOB: Jun 06, 1945 MRN: [id number]  PRIMARY CARE PROVIDER: Binnie Rail, MD  RESPONSIBLE PARTY:  Acct ID - Guarantor Home Phone Work Phone Relationship Acct Type  [id number] [facility] [phone]  Self P/F     [address], [place], [place] [address]     PLAN OF CARE and INTERVENTIONS:             1. GOALS OF CARE/ ADVANCE CARE PLANNING:  Goal is for patient to remain in her apartment.  She wishes to avoid hospitalizations.  Patient is a full code. 2. SOCIAL/EMOTIONAL/SPIRITUAL ASSESSMENT/ INTERVENTIONS:  SW and Palliative Care RN, Daryl Eastern, met with patient in her apartment.  She denied pain.  She has received her furniture and her apartment has been painted.  Patient continues to be a very good advocate for herself.  SW provided active listening and supportive counseling. 3. PATIENT/CAREGIVER EDUCATION/ COPING:  Patient copes by expressing herself openly. 4. PERSONAL EMERGENCY PLAN:  She will contact her medical provider, EMS or Palliative Care. 5. COMMUNITY RESOURCES COORDINATION/ HEALTH CARE NAVIGATION:  She has personal care services two days a week for five hours each. 6. FINANCIAL/LEGAL CONCERNS/INTERVENTIONS:  Patient is on a fixed income.     SOCIAL HX:  Social History   Tobacco Use  . Smoking status: Current Every Day Smoker    Packs/day: 0.50    Years: 44.00    Pack years: 22.00    Types: Cigarettes  . Smokeless tobacco: Never Used  . Tobacco comment: 3-4 cigaretters per day 11/20/2017   Substance Use Topics  . Alcohol use: Yes    Alcohol/week: 0.0 standard drinks    Comment: occasional    CODE STATUS:  Full Code ADVANCED DIRECTIVES: N MOST FORM COMPLETE:  Y HOSPICE EDUCATION PROVIDED:  N PPS: Patient reports her appetite is normal.  She stands independently and uses a walker. Duration of visit and documentation:  75 minutes.      Creola Corn Alvia Tory, LCSW

## 2018-04-10 ENCOUNTER — Encounter: Payer: Self-pay | Admitting: Internal Medicine

## 2018-04-15 DIAGNOSIS — Z7952 Long term (current) use of systemic steroids: Secondary | ICD-10-CM

## 2018-04-15 DIAGNOSIS — J9610 Chronic respiratory failure, unspecified whether with hypoxia or hypercapnia: Secondary | ICD-10-CM | POA: Diagnosis not present

## 2018-04-15 DIAGNOSIS — Z9981 Dependence on supplemental oxygen: Secondary | ICD-10-CM | POA: Diagnosis not present

## 2018-04-15 DIAGNOSIS — Z794 Long term (current) use of insulin: Secondary | ICD-10-CM | POA: Diagnosis not present

## 2018-04-15 DIAGNOSIS — I5032 Chronic diastolic (congestive) heart failure: Secondary | ICD-10-CM | POA: Diagnosis not present

## 2018-04-15 DIAGNOSIS — J441 Chronic obstructive pulmonary disease with (acute) exacerbation: Secondary | ICD-10-CM | POA: Diagnosis not present

## 2018-04-15 DIAGNOSIS — I110 Hypertensive heart disease with heart failure: Secondary | ICD-10-CM | POA: Diagnosis not present

## 2018-04-15 DIAGNOSIS — E119 Type 2 diabetes mellitus without complications: Secondary | ICD-10-CM | POA: Diagnosis not present

## 2018-04-15 DIAGNOSIS — F17210 Nicotine dependence, cigarettes, uncomplicated: Secondary | ICD-10-CM | POA: Diagnosis not present

## 2018-04-15 NOTE — Progress Notes (Signed)
COMMUNITY PALLIATIVE CARE RN NOTE  PATIENT NAME: [name] DOB: 05/10/1945 MRN: [id number]  PRIMARY CARE PROVIDER: Binnie Rail, MD  RESPONSIBLE PARTY:  Acct ID - Guarantor Home Phone Work Phone Relationship Acct Type  [id number] - Schmuhl,Rosealynn [phone]  Self P/F     [address], [place], [place] [address]    PLAN OF CARE and INTERVENTION:  1. ADVANCE CARE PLANNING/GOALS OF CARE: She wants to remain in her home for as long as possible. She is a Full Code. 2. PATIENT/CAREGIVER EDUCATION: Reinforced Safe Mobility, Relaxation Techniques and Energy Conservation 3. DISEASE STATUS: COVID-19 screening is negative. Joint visit made with Palliative Care SW, Jeani Hawking Duffy. Met with patient in her home. She is sitting up on the couch, awake and alert watching TV. She denies pain. She is very pleasant and engaging. No dyspnea noted while at rest, but continues with exertion. She reports that since my last visit, she has been on a course of Prednisone and antibiotics for suspected respiratory infection. She reports much improvement in breathing. She continues with some issues with anxiety, however Xanax if effective. She is O2 dependent. She is currently working with Physical Therapy to improve her strength and endurance. She has an appointment today with PT for re-evaluation. Patient now has her upper dentures, but states that it is still difficult to eat with them due to gum soreness. The dentist has encouraged her to wear them as much as possible during the day and remove at night. She states her intake remains normal. She remains ambulatory. She has a hired caregiver who comes 2x/week to assist with personal care and household chores. Will continue to monitor.  HISTORY OF PRESENT ILLNESS:  [name] who resides at home alone. Palliative Care Team continues to follow patient. Will continue to visit monthly and PRN.  CODE STATUS: Full Code  ADVANCED DIRECTIVES: N MOST FORM: yes PPS:  50%   PHYSICAL EXAM:   LUNGS: clear to auscultation  CARDIAC: Cor RRR EXTREMITIES: No edema SKIN: Skin color, texture, turgor normal. No rashes or lesions  NEURO: Alert and oriented x 3, pleasant mood, generalized weakness, ambulatory with walker   (Duration of visit and documentation 75 minutes)    Daryl Eastern, RN BSN

## 2018-04-16 ENCOUNTER — Other Ambulatory Visit: Payer: Self-pay | Admitting: *Deleted

## 2018-04-16 NOTE — Patient Outreach (Signed)
[facility]) Care Management  04/16/2018  [name] Apr 11, 1945 [id number]   [place]  Referral Date: 11/03/2017 Referral Source: MD Referral Reason for Referral: Housing Resources & Disease Management Education Insurance:Medicare   Outreach Attempt:  Outreach attempt #1 to patient for follow up. No answer. RN Health Coach left HIPAA compliant voicemail message along with contact information.  Plan:  RN Health Coach will make another outreach attempt within the month of April.  [place] [phone] [name]

## 2018-04-17 DIAGNOSIS — F17210 Nicotine dependence, cigarettes, uncomplicated: Secondary | ICD-10-CM | POA: Diagnosis not present

## 2018-04-17 DIAGNOSIS — J9610 Chronic respiratory failure, unspecified whether with hypoxia or hypercapnia: Secondary | ICD-10-CM | POA: Diagnosis not present

## 2018-04-17 DIAGNOSIS — I110 Hypertensive heart disease with heart failure: Secondary | ICD-10-CM | POA: Diagnosis not present

## 2018-04-17 DIAGNOSIS — J441 Chronic obstructive pulmonary disease with (acute) exacerbation: Secondary | ICD-10-CM | POA: Diagnosis not present

## 2018-04-17 DIAGNOSIS — E119 Type 2 diabetes mellitus without complications: Secondary | ICD-10-CM | POA: Diagnosis not present

## 2018-04-17 DIAGNOSIS — I5032 Chronic diastolic (congestive) heart failure: Secondary | ICD-10-CM | POA: Diagnosis not present

## 2018-04-19 DIAGNOSIS — J9610 Chronic respiratory failure, unspecified whether with hypoxia or hypercapnia: Secondary | ICD-10-CM | POA: Diagnosis not present

## 2018-04-19 DIAGNOSIS — J441 Chronic obstructive pulmonary disease with (acute) exacerbation: Secondary | ICD-10-CM | POA: Diagnosis not present

## 2018-04-19 DIAGNOSIS — I5032 Chronic diastolic (congestive) heart failure: Secondary | ICD-10-CM | POA: Diagnosis not present

## 2018-04-19 DIAGNOSIS — Z9981 Dependence on supplemental oxygen: Secondary | ICD-10-CM | POA: Diagnosis not present

## 2018-04-19 DIAGNOSIS — F17210 Nicotine dependence, cigarettes, uncomplicated: Secondary | ICD-10-CM | POA: Diagnosis not present

## 2018-04-19 DIAGNOSIS — Z7952 Long term (current) use of systemic steroids: Secondary | ICD-10-CM | POA: Diagnosis not present

## 2018-04-19 DIAGNOSIS — I110 Hypertensive heart disease with heart failure: Secondary | ICD-10-CM | POA: Diagnosis not present

## 2018-04-19 DIAGNOSIS — E119 Type 2 diabetes mellitus without complications: Secondary | ICD-10-CM | POA: Diagnosis not present

## 2018-04-19 DIAGNOSIS — Z794 Long term (current) use of insulin: Secondary | ICD-10-CM | POA: Diagnosis not present

## 2018-04-21 DIAGNOSIS — I110 Hypertensive heart disease with heart failure: Secondary | ICD-10-CM | POA: Diagnosis not present

## 2018-04-21 DIAGNOSIS — J9610 Chronic respiratory failure, unspecified whether with hypoxia or hypercapnia: Secondary | ICD-10-CM | POA: Diagnosis not present

## 2018-04-21 DIAGNOSIS — F17210 Nicotine dependence, cigarettes, uncomplicated: Secondary | ICD-10-CM | POA: Diagnosis not present

## 2018-04-21 DIAGNOSIS — E119 Type 2 diabetes mellitus without complications: Secondary | ICD-10-CM | POA: Diagnosis not present

## 2018-04-21 DIAGNOSIS — I5032 Chronic diastolic (congestive) heart failure: Secondary | ICD-10-CM | POA: Diagnosis not present

## 2018-04-21 DIAGNOSIS — J441 Chronic obstructive pulmonary disease with (acute) exacerbation: Secondary | ICD-10-CM | POA: Diagnosis not present

## 2018-04-23 ENCOUNTER — Other Ambulatory Visit: Payer: Self-pay | Admitting: Internal Medicine

## 2018-04-24 ENCOUNTER — Other Ambulatory Visit: Payer: Self-pay | Admitting: Internal Medicine

## 2018-04-24 NOTE — Telephone Encounter (Signed)
Last refill was 03/27/18 Last OV 01/26/18 Next OV N/A

## 2018-04-27 DIAGNOSIS — J9610 Chronic respiratory failure, unspecified whether with hypoxia or hypercapnia: Secondary | ICD-10-CM | POA: Diagnosis not present

## 2018-04-27 DIAGNOSIS — E119 Type 2 diabetes mellitus without complications: Secondary | ICD-10-CM | POA: Diagnosis not present

## 2018-04-27 DIAGNOSIS — I5032 Chronic diastolic (congestive) heart failure: Secondary | ICD-10-CM | POA: Diagnosis not present

## 2018-04-27 DIAGNOSIS — J441 Chronic obstructive pulmonary disease with (acute) exacerbation: Secondary | ICD-10-CM | POA: Diagnosis not present

## 2018-04-27 DIAGNOSIS — I110 Hypertensive heart disease with heart failure: Secondary | ICD-10-CM | POA: Diagnosis not present

## 2018-04-27 DIAGNOSIS — F17210 Nicotine dependence, cigarettes, uncomplicated: Secondary | ICD-10-CM | POA: Diagnosis not present

## 2018-04-29 ENCOUNTER — Other Ambulatory Visit: Payer: Self-pay

## 2018-04-29 ENCOUNTER — Other Ambulatory Visit: Payer: Medicare Other | Admitting: Licensed Clinical Social Worker

## 2018-04-29 ENCOUNTER — Telehealth: Payer: Self-pay | Admitting: Licensed Clinical Social Worker

## 2018-04-29 DIAGNOSIS — Z515 Encounter for palliative care: Secondary | ICD-10-CM

## 2018-04-29 NOTE — Progress Notes (Signed)
COMMUNITY PALLIATIVE CARE SW NOTE  PATIENT NAME: [name] DOB: Sep 14, 1945 MRN: [id number]  PRIMARY CARE PROVIDER: Binnie Rail, MD  RESPONSIBLE PARTY:  Acct ID - Guarantor Home Phone Work Phone Relationship Acct Type  [id number] [facility] [phone]  Self P/F     [address], [place], [place] [address]   Due to the COVID-19 crisis, this virtual check-in visit was done via telephone from my office and it was initiated and consent given by this patient and or family.  PLAN OF CARE and INTERVENTIONS:             1. GOALS OF CARE/ ADVANCE CARE PLANNING:  Patient wishes to remain in her apartment as long as possible.  Patient is a full code. 2. SOCIAL/EMOTIONAL/SPIRITUAL ASSESSMENT/ INTERVENTIONS:  SW phoned patient and conducted a virtual check-in visit while patient was in her apartment.  Patient does not have the capability of conducting a video visit.  Patient reports sleeping during the day and not sleeping at night.  The pandemic had caused patient increased boredom.  SW provided active listening while patient discussed her extended family. 3. PATIENT/CAREGIVER EDUCATION/ COPING:  Patient copes by expressing herself openly. 4. PERSONAL EMERGENCY PLAN:  Patient will contact EMS or Palliative Care. 5. COMMUNITY RESOURCES COORDINATION/ HEALTH CARE NAVIGATION:  Patient has increased CNA assistance for four days a week for four hours a day. 6. FINANCIAL/LEGAL CONCERNS/INTERVENTIONS:  Patient is on a fixed income.     SOCIAL HX:  Social History   Tobacco Use  . Smoking status: Current Every Day Smoker    Packs/day: 0.50    Years: 44.00    Pack years: 22.00    Types: Cigarettes  . Smokeless tobacco: Never Used  . Tobacco comment: 3-4 cigaretters per day 11/20/2017   Substance Use Topics  . Alcohol use: Yes    Alcohol/week: 0.0 standard drinks    Comment: occasional    CODE STATUS:  Full Code  ADVANCED DIRECTIVES: N MOST FORM COMPLETE:  Y HOSPICE EDUCATION PROVIDED:   N PPS: Patient's appetite it normal.  She uses a walker and can stand independently. Duration of visit and documentation:  45 minutes.      Creola Corn Rheanna Sergent, LCSW

## 2018-04-29 NOTE — Telephone Encounter (Signed)
Palliative Care SW spoke with patient who agreed to a virtual check-in today at 2:00pm.  She does not have the capability to conduct a video visit.

## 2018-05-06 DIAGNOSIS — I110 Hypertensive heart disease with heart failure: Secondary | ICD-10-CM | POA: Diagnosis not present

## 2018-05-06 DIAGNOSIS — J9610 Chronic respiratory failure, unspecified whether with hypoxia or hypercapnia: Secondary | ICD-10-CM | POA: Diagnosis not present

## 2018-05-06 DIAGNOSIS — I5032 Chronic diastolic (congestive) heart failure: Secondary | ICD-10-CM | POA: Diagnosis not present

## 2018-05-06 DIAGNOSIS — F17210 Nicotine dependence, cigarettes, uncomplicated: Secondary | ICD-10-CM | POA: Diagnosis not present

## 2018-05-06 DIAGNOSIS — J441 Chronic obstructive pulmonary disease with (acute) exacerbation: Secondary | ICD-10-CM | POA: Diagnosis not present

## 2018-05-06 DIAGNOSIS — E119 Type 2 diabetes mellitus without complications: Secondary | ICD-10-CM | POA: Diagnosis not present

## 2018-05-07 ENCOUNTER — Encounter: Payer: Self-pay | Admitting: *Deleted

## 2018-05-07 ENCOUNTER — Other Ambulatory Visit: Payer: Self-pay | Admitting: *Deleted

## 2018-05-07 NOTE — Progress Notes (Deleted)
Subjective:   [name] is a 73 y.o. female who presents for an Initial Medicare Annual Wellness Visit.  Review of Systems    No ROS.  Medicare Wellness Visit. Additional risk factors are reflected in the social history.     Sleep patterns: {SX; SLEEP PATTERNS:18802::"feels rested on waking","does not get up to void","gets up *** times nightly to void","sleeps *** hours nightly"}.    Home Safety/Smoke Alarms: Feels safe in home. Smoke alarms in place.  Living environment; residence and Firearm Safety: {Rehab home environment / accessibility:30080::"no firearms","firearms stored safely"}. Seat Belt Safety/Bike Helmet: Wears seat belt.     Objective:    There were no vitals filed for this visit. There is no height or weight on file to calculate BMI.  Advanced Directives 03/26/2018 11/27/2017 11/03/2017 02/24/2016 02/22/2016 02/22/2016 07/04/2015  Does Patient Have a Medical Advance Directive? No No No No No No No  Would patient like information on creating a medical advance directive? No - Patient declined No - Patient declined No - Patient declined No - Patient declined No - Patient declined No - Patient declined No - patient declined information  Pre-existing out of facility DNR order (yellow form or pink MOST form) - - - - - - -    Current Medications (verified) Outpatient Encounter Medications as of 05/08/2018  Medication Sig  . albuterol (PROVENTIL HFA;VENTOLIN HFA) 108 (90 Base) MCG/ACT inhaler Inhale 2 puffs into the lungs every 6 (six) hours as needed for wheezing or shortness of breath.  Marland Kitchen albuterol (PROVENTIL) (2.5 MG/3ML) 0.083% nebulizer solution Take 2.5 mg by nebulization every 6 (six) hours as needed for wheezing or shortness of breath.  . ALPRAZolam (XANAX) 0.5 MG tablet TAKE 1 TABLET BY MOUTH THREE TIMES A DAY AS NEEDED FOR ANXIETY  . doxycycline (VIBRA-TABS) 100 MG tablet Take 1 tablet (100 mg total) by mouth 2 (two) times daily.  . fluticasone (FLONASE) 50 MCG/ACT nasal  spray USE 2 SPRAYS IN EACH NOSTRIL EVERY DAY AS NEEDED FOR ALLERGIES AND RHINITIS  . furosemide (LASIX) 40 MG tablet Take 40 mg by mouth.  Marland Kitchen GLUCOCOM LANCETS 33G MISC Use with Test Strips to take blood sugars  . glucose blood (CONTOUR NEXT TEST) test strip USE TO TAKE BLOOD SUGARS TWICE DAILY.  Marland Kitchen Insulin Glargine (LANTUS SOLOSTAR) 100 UNIT/ML Solostar Pen INJECT 32 UNITS INTO THE SKIN DAILY AT 10 PM.  . Insulin Pen Needle (BD PEN NEEDLE NANO U/F) 32G X 4 MM MISC USE TO ADMINISTER LANTUS INSULIN  . nystatin (MYCOSTATIN) 100000 UNIT/ML suspension Take 5 mLs (500,000 Units total) by mouth 4 (four) times daily.  . pantoprazole (PROTONIX) 40 MG tablet Take 1 tablet (40 mg total) by mouth daily.  Marland Kitchen PARoxetine (PAXIL) 40 MG tablet TAKE 1 TABLET BY MOUTH EVERY DAY IN THE MORNING  . predniSONE (DELTASONE) 10 MG tablet TAKE 2 TABLETS BY MOUTH EVERY DAY  . TRELEGY ELLIPTA 100-62.5-25 MCG/INH AEPB TAKE 1 PUFF BY MOUTH EVERY DAY  . triamcinolone cream (KENALOG) 0.1 % Apply 1 application topically 2 (two) times daily.   No facility-administered encounter medications on file as of 05/08/2018.     Allergies (verified) Ambien [zolpidem tartrate]   History: Past Medical History:  Diagnosis Date  . Arrhythmia   . Bronchiectasis march 2012   On CT chest. RUL. Mild  . CHF (congestive heart failure) ([place])   . Chicken pox   . Chronic diastolic heart failure ([place])   . Chronic respiratory failure ([place])  Followed in Pulmonary clinic/ [facility]/ Ramaswamy  - 06/30/2012 desat to 86%  RA walking 50 ft, recovered to 90% at rest - 06/30/2012  Walked 1lpm x 3 laps @ 185 ft each stopped due to  Sob, no desat  rec 02 2lpm with activity and sleeping, ok at rest   . COPD (chronic obstructive pulmonary disease) ([place])     FEV-1 in 2008 was 63% with a diffusion capacity of 33%.   . Generalized anxiety disorder   . History of cervical cancer 1982  . Hyperlipidemia   . Hypertension   . Leg swelling    Venous  doppler right 07/21/12 >>Neg    . 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 Medical non-compliance   . MITRAL VALVE PROLAPSE   . Obesity, unspecified   . Pulmonary nodule march 2012   50mm RUL and RLL 1st seen march 2012 CT, ?progression 12/2012 CT  . Seasonal allergies   . Tobacco abuse     Smokes one pack a day since age 24.   Past Surgical History:  Procedure Laterality Date  . TOTAL ABDOMINAL HYSTERECTOMY     Family History  Problem Relation Age of Onset  . Other Father        MVA  . Esophageal cancer Mother    Social History   Socioeconomic History  . Marital status: Divorced    Spouse name: Not on file  . Number of children: Not on file  . Years of education: Not on file  . Highest education level: Not on file  Occupational History  . Occupation: works at a call center  Social Needs  . Financial resource strain: Not on file  . Food insecurity:    Worry: Not on file    Inability: Not on file  . Transportation needs:    Medical: Not on file    Non-medical: Not on file  Tobacco Use  . Smoking status: Current Every Day Smoker    Packs/day: 0.50    Years: 44.00    Pack years: 22.00    Types: Cigarettes  . Smokeless tobacco: Never Used  . Tobacco comment: 3-4 cigaretters per day 11/20/2017   Substance and Sexual Activity  . Alcohol use: Yes    Alcohol/week: 0.0 standard drinks    Comment: occasional  . Drug use: No  . Sexual activity: Not on file  Lifestyle  . Physical activity:    Days per week: Not on file    Minutes per session: Not on file  . Stress: Not on file  Relationships  . Social connections:    Talks on phone: Not on file    Gets together: Not on file    Attends religious service: Not on file    Active member of club or organization: Not on file    Attends meetings of clubs or organizations: Not on file    Relationship status: Not on file  Other Topics Concern  . Not on file  Social History Narrative    Married but recently separated from husband    Tobacco Counseling Ready to quit: Not Answered Counseling given: Not Answered Comment: 3-4 cigaretters per day 11/20/2017   Activities of Daily Living In your present state of health, do you have any difficulty performing the following activities: 03/26/2018 11/27/2017  Hearing? N N  Vision? Y N  Comment Recently cancelled appointment with eye doctor. -  Difficulty concentrating or making decisions? N  N  Walking or climbing stairs? N Y  Dressing or bathing? N Y  Doing errands, shopping? N Y  Conservation officer, nature and eating ? N Y  Using the Toilet? N N  In the past six months, have you accidently leaked urine? N Y  Do you have problems with loss of bowel control? N N  Managing your Medications? N N  Managing your Finances? N N  Housekeeping or managing your Housekeeping? N Y  Some recent data might be hidden     Immunizations and Health Maintenance Immunization History  Administered Date(s) Administered  . Influenza Split 11/12/2010  . Influenza, High Dose Seasonal PF 11/09/2012, 10/31/2015, 11/20/2017  . Influenza,inj,Quad PF,6+ Mos 10/20/2014  . Pneumococcal Conjugate-13 05/17/2013  . Pneumococcal Polysaccharide-23 03/22/2010, 06/24/2016   Health Maintenance Due  Topic Date Due  . URINE MICROALBUMIN  05/01/1957  . TETANUS/TDAP  05/02/1966  . MAMMOGRAM  02/23/2014  . HEMOGLOBIN A1C  12/31/2017    Patient Care Team: Binnie Rail, MD as PCP - General (Internal Medicine) Tanda Rockers, MD (Pulmonary Disease) Brand Males, MD (Pulmonary Disease) Duffy, Creola Corn, LCSW as Social Worker (Licensed Clinical Social Worker) Conan Bowens, RN as Registered Nurse (Hospice and Palliative Medicine) Leona Singleton, RN as [place] any recent [place] you may have received from other than [facility] providers in the past year (date may be approximate).     Assessment:   This is a  routine wellness examination for Jarielys. Physical assessment deferred to PCP.  Hearing/Vision screen No exam data present  Dietary issues and exercise activities discussed:   Diet (meal preparation, eat out, water intake, caffeinated beverages, dairy products, fruits and vegetables): {Desc; diets:16563}  Goals   None    Depression Screen PHQ 2/9 Scores 03/26/2018 11/27/2017 11/03/2017 06/24/2016 01/27/2015 07/12/2013 09/14/2012  PHQ - 2 Score 2 0 2 0 2 2 1   PHQ- 9 Score 13 - 12 - 5 13 -    Fall Risk Fall Risk  05/07/2018 03/26/2018 03/16/2018 11/27/2017 06/24/2016  Falls in the past year? 1 1 1 1  No  Comment Last Fall October 2019 - no falls since October 2019 - -  Number falls in past yr: 1 1 1 1  -  Injury with Fall? 0 0 0 0 -  Risk for fall due to : History of fall(s);Impaired vision;Medication side effect;Impaired balance/gait;Impaired mobility History of fall(s);Impaired balance/gait;Impaired mobility;Impaired vision;Medication side effect History of fall(s);Impaired vision;Medication side effect;Impaired balance/gait;Impaired mobility Impaired vision;Impaired balance/gait;Medication side effect;Impaired mobility -  Follow up Falls evaluation completed;Falls prevention discussed;Education provided Education provided;Falls prevention discussed Falls evaluation completed;Falls prevention discussed;Education provided Falls prevention discussed;Education provided -   Cognitive Function:        Screening Tests Health Maintenance  Topic Date Due  . URINE MICROALBUMIN  05/01/1957  . TETANUS/TDAP  05/02/1966  . MAMMOGRAM  02/23/2014  . HEMOGLOBIN A1C  12/31/2017  . Hepatitis C Screening  07/01/2028 (Originally 03-04-1945)  . FOOT EXAM  07/02/2018  . INFLUENZA VACCINE  08/22/2018  . OPHTHALMOLOGY EXAM  03/30/2019  . Fecal DNA (Cologuard)  07/23/2020  . DEXA SCAN  Completed  . PNA vac Low Risk Adult  Completed     Plan:     I have personally reviewed and noted the following in the  patient's chart:   . Medical and social history . Use of alcohol, tobacco or illicit drugs  . Current medications and supplements . Functional ability and status .  Nutritional status . Physical activity . Advanced directives . List of other physicians . Vitals . Screenings to include cognitive, depression, and falls . Referrals and appointments  In addition, I have reviewed and discussed with patient certain preventive protocols, quality metrics, and best practice recommendations. A written personalized care plan for preventive services as well as general preventive health recommendations were provided to patient.     Michiel Cowboy, RN   05/07/2018

## 2018-05-07 NOTE — Patient Outreach (Addendum)
[facility]) Care Management  [facility] Care Manager  05/07/2018   [name] 1945-04-04 [id number]    [place] Quarterly Outreach   Referral Date:  11/03/2017 Referral Source:  MD Referral Reason for Referral:  Housing Resources & Disease Management Education Insurance:  Medicare   Outreach Attempt:  Received telephone call back from patient.  HIPAA verified with patient.  Patient reporting she is doing well.  States her breathing has been better since she has been sheltering at home and decreased outside exposure.  Denies any recent cough or fever.  Has not had to use rescue inhaler in the last week.  Continues to monitor blood sugars.  Fasting blood sugar this morning 121.  Has not been weighing daily.  Last weight Monday was 212 pounds.  Discussed importance of daily weight monitoring and when to call physician based on weight.  Patient reporting her anxiety is some better after working with home health physical therapist.  States she is able to walk outside about twice a week with therapist.  Encounter Medications:  Outpatient Encounter Medications as of 05/07/2018  Medication Sig Note  . albuterol (PROVENTIL HFA;VENTOLIN HFA) 108 (90 Base) MCG/ACT inhaler Inhale 2 puffs into the lungs every 6 (six) hours as needed for wheezing or shortness of breath.   Marland Kitchen albuterol (PROVENTIL) (2.5 MG/3ML) 0.083% nebulizer solution Take 2.5 mg by nebulization every 6 (six) hours as needed for wheezing or shortness of breath.   . ALPRAZolam (XANAX) 0.5 MG tablet TAKE 1 TABLET BY MOUTH THREE TIMES A DAY AS NEEDED FOR ANXIETY   . fluticasone (FLONASE) 50 MCG/ACT nasal spray USE 2 SPRAYS IN EACH NOSTRIL EVERY DAY AS NEEDED FOR ALLERGIES AND RHINITIS   . furosemide (LASIX) 40 MG tablet Take 40 mg by mouth.   Marland Kitchen GLUCOCOM LANCETS 33G MISC Use with Test Strips to take blood sugars   . glucose blood (CONTOUR NEXT TEST) test strip USE TO TAKE BLOOD SUGARS TWICE DAILY.   Marland Kitchen Insulin Glargine  (LANTUS SOLOSTAR) 100 UNIT/ML Solostar Pen INJECT 32 UNITS INTO THE SKIN DAILY AT 10 PM. 03/16/2018: Decreased to 22 units nightly  . Insulin Pen Needle (BD PEN NEEDLE NANO U/F) 32G X 4 MM MISC USE TO ADMINISTER LANTUS INSULIN   . nystatin (MYCOSTATIN) 100000 UNIT/ML suspension Take 5 mLs (500,000 Units total) by mouth 4 (four) times daily. 11/27/2017: Takes as needed  . pantoprazole (PROTONIX) 40 MG tablet Take 1 tablet (40 mg total) by mouth daily.   Marland Kitchen PARoxetine (PAXIL) 40 MG tablet TAKE 1 TABLET BY MOUTH EVERY DAY IN THE MORNING   . predniSONE (DELTASONE) 10 MG tablet TAKE 2 TABLETS BY MOUTH EVERY DAY   . TRELEGY ELLIPTA 100-62.5-25 MCG/INH AEPB TAKE 1 PUFF BY MOUTH EVERY DAY   . triamcinolone cream (KENALOG) 0.1 % Apply 1 application topically 2 (two) times daily.   Marland Kitchen doxycycline (VIBRA-TABS) 100 MG tablet Take 1 tablet (100 mg total) by mouth 2 (two) times daily. 05/07/2018: completed   No facility-administered encounter medications on file as of 05/07/2018.     Functional Status:  In your present state of health, do you have any difficulty performing the following activities: 03/26/2018 11/27/2017  Hearing? N N  Vision? Y N  Comment Recently cancelled appointment with eye doctor. -  Difficulty concentrating or making decisions? N N  Walking or climbing stairs? N Y  Dressing or bathing? N Y  Doing errands, shopping? N Y  Conservation officer, nature and eating ? [name]  Using the Toilet? N N  In the past six months, have you accidently leaked urine? N Y  Do you have problems with loss of bowel control? N N  Managing your Medications? N N  Managing your Finances? N N  Housekeeping or managing your Housekeeping? N Y  Some recent data might be hidden    Fall/Depression Screening: Fall Risk  05/07/2018 03/26/2018 03/16/2018  Falls in the past year? _0 Comment Last Fall October 2019 - no falls since October 2019  Number falls in past yr: _1 Injury with Fall? 0 0 0  Risk for fall due to : History  of fall(s);Impaired vision;Medication side effect;Impaired balance/gait;Impaired mobility History of fall(s);Impaired balance/gait;Impaired mobility;Impaired vision;Medication side effect History of fall(s);Impaired vision;Medication side effect;Impaired balance/gait;Impaired mobility  Follow up Falls evaluation completed;Falls prevention discussed;Education provided Education provided;Falls prevention discussed Falls evaluation completed;Falls prevention discussed;Education provided   [facility] 2/9 Scores 03/26/2018 11/27/2017 11/03/2017 06/24/2016 01/27/2015 07/12/2013 09/14/2012  PHQ - 2 Score 2 0 2 0 _2 PHQ- 9 Score 13 - 12 - 5 13 -   [name] CM Care Plan Problem One     Most Recent Value  Care Plan Problem One  Knowledge deficiet related to self care of COPD and CHF.  Role Documenting the Problem One  [place] for Problem One  Active  [facility] Long Term Goal   Patient will report weighing herself daily in the next 90 days.  [name] Long Term Goal Start Date  05/07/18  Interventions for Problem One Long Term Goal  Reviewed and discussed current care plan and goals, discussed importance of daily weight monitoring and encouraged patient to weigh herself daily, discussed making scale readily accessible for ease of daily weight monitoring, encouraged patient to participate in Telehealth appointment with primary care provider, discussed smart phone/android use, reviewed medications and encouraged medication compliance, encouraged continued social distancing and stay at home request, reviewed signs and symptoms of COVID 19 and encouraged patient to contact primary care provider assistance if symptoms arise, encouraged continued participation with home health physical therapy, discussed and encouraged low salt diabetic diet,   [name] CM Short Term Goal #1   Patient will report a decrease in anxiety or agoraphobia in the next 30 days.  [name] CM Short Term Goal #1 Start Date  03/16/18  [facility] CM Short Term Goal #1 Met  Date  05/07/18     Appointments:  Patient stating she was notified of a Virtual Telehealth appointment with her primary care provider, Dr. Quay Burow scheduled for tomorrow, April 17.  Encouraged patient to participate in virtual appointment.  Discussed smart/andriod phone operations.  Continues to be followed by Palliative Care.  Plan: RN Health Coach will send primary care provider quarterly update. RN Health Coach will make next telephone outreach to patient in the month of July.  [place] [phone] [email or web address]

## 2018-05-08 ENCOUNTER — Ambulatory Visit: Payer: Medicare Other

## 2018-05-11 ENCOUNTER — Ambulatory Visit: Payer: Self-pay | Admitting: *Deleted

## 2018-05-12 ENCOUNTER — Encounter: Payer: Self-pay | Admitting: Adult Health

## 2018-05-12 ENCOUNTER — Other Ambulatory Visit: Payer: Self-pay

## 2018-05-12 ENCOUNTER — Ambulatory Visit (INDEPENDENT_AMBULATORY_CARE_PROVIDER_SITE_OTHER): Payer: Medicare Other | Admitting: Adult Health

## 2018-05-12 ENCOUNTER — Telehealth: Payer: Self-pay | Admitting: Internal Medicine

## 2018-05-12 DIAGNOSIS — J9612 Chronic respiratory failure with hypercapnia: Secondary | ICD-10-CM | POA: Diagnosis not present

## 2018-05-12 DIAGNOSIS — J9611 Chronic respiratory failure with hypoxia: Secondary | ICD-10-CM

## 2018-05-12 DIAGNOSIS — J441 Chronic obstructive pulmonary disease with (acute) exacerbation: Secondary | ICD-10-CM

## 2018-05-12 NOTE — Telephone Encounter (Signed)
Can set up televisit this evening

## 2018-05-12 NOTE — Patient Instructions (Signed)
Increase Prednisone 40mg  daily for 4 days then 20mg  daily -hold at this dose.  Mucinex DM Twice daily  As needed  Cough/congestion  Albuterol Neb every every 4hr as needed for wheezing /shortness of breath .  Continue on TRELEGY 1 puff daily , rinse after use.  Coninue on Oxygen 5l/m , goal is to have Oxygen level >88%.  Follow up in 4 weeks and As needed   Please contact office for sooner follow up if symptoms do not improve or worsen or seek emergency care

## 2018-05-12 NOTE — Telephone Encounter (Signed)
Primary Pulmonologist: MR Last office visit and with whom: 11/20/17 with MR What do we see them for (pulmonary problems): chronic resp fail with hypoxia Last OV assessment/plan: Instructions     ICD-10-CM   1. Chronic respiratory failure with hypoxia (HCC) J96.11   2. Stage 4 very severe COPD by GOLD classification ([place]) J44.9    Stable diseaese  PLAN Continue 5L [place]  Continue trelegy daily ontinue prednisone at 20mg  per day Continue nebs alb as needed Quit smoking Continue home palliative care High-dose flu shot today Start daliresp   - Take new tablet called roflumilast 1 tablet              -250 microgram x 30 days and then 542mcg daily; take with food  Followup  in 6 months or sooner if needed       Was appointment offered to patient (explain)?  Pt wants meds to be prescribed.   Reason for call: called and spoke with pt who stated SOB and congestion began yesterday 4/20 and pt stated symptoms have become worse. Pt stated the congestion is in her chest. Pt states when she coughs, she is not able to get any mucus up.   Pt stated she has used her nebulizer once today and she stated that did help with her symptoms. Pt is also using trelegy inhaler as prescribed.  Asked pt what O2 sats have been and pt stated they have been ranging from 90-92% on 5L O2. Pt denies any fever, body aches or chills.  Pt has not travelled anywhere recently and has not been around anyone that has been sick. Pt stated she still has an aide that has been coming to the house to help her out but the aide has not been sick.  Pt is wanting to know if something could be prescribed to help with her symptoms.  Tammy, please advise recs for pt. Thanks!

## 2018-05-12 NOTE — Assessment & Plan Note (Signed)
Mild flare - hold on abx for now however advised on red flags to monitor for if develop worsening breathing, fever, discolored mucus COVID-19 precautions discussed We will increase prednisone briefly, maximize pulmonary hygiene with albuterol neb and Mucinex Close follow-up in 4 weeks  Plan  Patient Instructions  Increase Prednisone 40mg  daily for 4 days then 20mg  daily -hold at this dose.  Mucinex DM Twice daily  As needed  Cough/congestion  Albuterol Neb every every 4hr as needed for wheezing /shortness of breath .  Continue on TRELEGY 1 puff daily , rinse after use.  Coninue on Oxygen 5l/m , goal is to have Oxygen level >88%.  Follow up in 4 weeks and As needed   Please contact office for sooner follow up if symptoms do not improve or worsen or seek emergency care

## 2018-05-12 NOTE — Progress Notes (Signed)
Virtual Visit via Telephone Note  I connected with Dalilah Woodrome on 05/12/18 at  3:00 PM EDT by telephone and verified that I am speaking with the correct person using two identifiers.   I discussed the limitations, risks, security and privacy concerns of performing an evaluation and management service by telephone and the availability of in person appointments. I also discussed with the patient that there may be a patient responsible charge related to this service. The patient expressed understanding and agreed to proceed.   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  Patient is present at her home, myself is present today at office  Patient complains of over last 24hrs has not been feeling well. Has had increased chest congestion that is thick and hard to cough out. Had increased dyspnea this am , took albuterol neb and this really helped. No fever, discolored mucus. No recent travel. No edema or orthopnea  Lives alone. Has aide that comes in daily to help her. No known sick contacts.  Remains on TRELEGY for her COPD.  Appetite is fair. No n/v/d. Recently had top teeth removed few months ago, hard to get used to new dentures. Has not been able to go to get checked due to COVID restrictions. Eating softer food.  Has been practicing social distancing , has mobile meals delivered weekly  and aide helps get occasional supplies. Advised her on COVID 19 precautions .  Chest xray in 01/2018 showed chronic COPD changes w/ no acute process.  Stable nodule in the RLL .  Is on Oxygen 5l/m . This is her baseline . Checks oxygen level at home , running 90-92% .   Was started on Daliresp in Oct 2019 but was not able to afford, not covered by her insurance.       Observations/Objective:  FEV-1 in 2008 was 63% with a diffusion capacity of 33%  PFTs 78/14 shows gold stage 3 copd with fev1 1.12L/49%, FVC 2.01L/70% - post bd parameters. No bd response, Ratio 60/77%. TLC 87%, DLCO 5/21%  Duplex LE - 07/21/12 - negative. BNP 07/16/12 - 25   2 D echo on 03/08/15 showed EF 60-65%, Grade 1 DD , PAP mod increased at 61mmHg.    FEV-1 in 2008 was 63% with a diffusion capacity of 33%  2 D echo on 03/08/15 showed EF 60-65%, Grade 1 DD , PAP mod increased at 31mmHg.   CT chest 03/21/15 showed increased mediastinal/bilateral hilar lymphadenopahty worrisome for lymphoma. Unchanged pulmonary nodules x 2 years.   Subsequent PET scan showed intensely hypermetabolic  adenopahty in chest .   Referred to [facility] 03/2015 >too sick for bx.   Walk test in office 05/12/2015 > on 3 L baseline oxygen walking 185 feet 3 laps: She only walk one lap and then she desaturated to 86%. At this point her oxygen was increased to 6 L and then she walk one lap and maintained a pulse ox of 90%. After that she was dyspneic and did not want walk further.   A/P   COPD exacerbation    O2 RF   Plan  Patient Instructions  Increase Prednisone 40mg  daily for 4 days then 20mg  daily -hold at this dose.  Mucinex DM Twice daily  As needed  Cough/congestion  Albuterol Neb every every 4hr as  needed for wheezing /shortness of breath .  Continue on TRELEGY 1 puff daily , rinse after use.  Coninue on Oxygen 5l/m , goal is to have Oxygen level >88%.  Follow up in 4 weeks and As needed   Please contact office for sooner follow up if symptoms do not improve or worsen or seek emergency care        Follow Up Instructions: Follow-up in 4 weeks and as needed Please contact office for sooner follow up if symptoms do not improve or worsen or seek emergency care      I discussed the assessment and treatment plan with the patient. The patient was provided an opportunity to ask questions and  all were answered. The patient agreed with the plan and demonstrated an understanding of the instructions.   The patient was advised to call back or seek an in-person evaluation if the symptoms worsen or if the condition fails to improve as anticipated.  I provided 22  minutes of non-face-to-face time during this encounter.   [name], NP

## 2018-05-12 NOTE — Telephone Encounter (Signed)
Called and spoke with pt stating to her that TP wants [place] to schedule televisit. Pt expressed understanding. televisit scheduled for pt with TP today at 3pm.nothing further needed.

## 2018-05-12 NOTE — Assessment & Plan Note (Signed)
Cont on O2 , goal >88%.   Plan  Patient Instructions  Increase Prednisone 40mg  daily for 4 days then 20mg  daily -hold at this dose.  Mucinex DM Twice daily  As needed  Cough/congestion  Albuterol Neb every every 4hr as needed for wheezing /shortness of breath .  Continue on TRELEGY 1 puff daily , rinse after use.  Coninue on Oxygen 5l/m , goal is to have Oxygen level >88%.  Follow up in 4 weeks and As needed   Please contact office for sooner follow up if symptoms do not improve or worsen or seek emergency care

## 2018-05-13 ENCOUNTER — Other Ambulatory Visit: Payer: Medicare Other | Admitting: *Deleted

## 2018-05-13 ENCOUNTER — Other Ambulatory Visit: Payer: Self-pay

## 2018-05-13 DIAGNOSIS — Z515 Encounter for palliative care: Secondary | ICD-10-CM

## 2018-05-13 NOTE — Progress Notes (Signed)
COMMUNITY PALLIATIVE CARE RN NOTE  PATIENT NAME: [name] DOB: 10/26/1945 MRN: [id number]  PRIMARY CARE PROVIDER: Binnie Rail, MD  RESPONSIBLE PARTY:  Acct ID - Guarantor Home Phone Work Phone Relationship Acct Type  [id number] [facility] [phone]  Self P/F     [address], [place], [place] [address]   COVID-19 Pre-screen negative  PLAN OF CARE and INTERVENTION:  1. ADVANCE CARE PLANNING/GOALS OF CARE: Goal is to remain in her home. She is a Full Code. 2. PATIENT/CAREGIVER EDUCATION: Reinforced Safe Mobility/Transfers, Energy Conservation and Breathing Techniques 3. DISEASE STATUS: Met with patient in her home. She is sitting up on the couch watching TV. She denies pain. She reports that she has been having increased difficulties with breathing upon exertion and increased anxiety. She is on Oxygen at 5L/min via [place]. Current O2 sat 92%, and drops to 87% on exertion. She has a productive cough. Sputum is clear/white in appearance. Denies hemoptysis. She contacted her Pulmonology office yesterday and her Prednisone was increased to 40 mg for 2-3 days, then will return back to 20 mg daily. She says they also recommended Mucinex. She is having her hired Cg go to the store to purchase this medication for her today. She is feeling some better today, but is still noticing dyspnea with exertion, and also some during conversation. She has PRN Xanax for anxiety which remains helpful. Her intake is normal. She remains ambulatory with her walker, just requiring more frequent rest periods. Recommended that if symptoms begin to worsen, to call back to Pulmonology office and I can also visit if needed. Patient verbalized understanding.   HISTORY OF PRESENT ILLNESS:  [name] who resides at home alone. She has hired Investment banker, operational for 16 hours/week. Palliative Care Team continues to follow patient. Will continue to check in monthly and PRN.  CODE STATUS: Full Code ADVANCED DIRECTIVES: N MOST  FORM: yes PPS: 50%   PHYSICAL EXAM:   VITALS: Today's Vitals   05/13/18 1324  Pulse: (!) 110  Resp: 20  Temp: (!) 97.5 F (36.4 C)  TempSrc: Temporal  SpO2: 92%  PainSc: 0-No pain    LUNGS: decreased breath sounds, negative for wheezing, rhonchi or rales CARDIAC: Cor Tachy EXTREMITIES: No edema SKIN: Exposed skin is dry and intact  NEURO: Alert and oriented x 3, pleasant mood, generalized weakness, ambulatory with walker   (Duration of visit and documentation 75 minutes)    Daryl Eastern, RN BSN

## 2018-05-15 DIAGNOSIS — I110 Hypertensive heart disease with heart failure: Secondary | ICD-10-CM | POA: Diagnosis not present

## 2018-05-15 DIAGNOSIS — F17210 Nicotine dependence, cigarettes, uncomplicated: Secondary | ICD-10-CM | POA: Diagnosis not present

## 2018-05-15 DIAGNOSIS — J441 Chronic obstructive pulmonary disease with (acute) exacerbation: Secondary | ICD-10-CM | POA: Diagnosis not present

## 2018-05-15 DIAGNOSIS — I5032 Chronic diastolic (congestive) heart failure: Secondary | ICD-10-CM | POA: Diagnosis not present

## 2018-05-15 DIAGNOSIS — E119 Type 2 diabetes mellitus without complications: Secondary | ICD-10-CM | POA: Diagnosis not present

## 2018-05-15 DIAGNOSIS — J9610 Chronic respiratory failure, unspecified whether with hypoxia or hypercapnia: Secondary | ICD-10-CM | POA: Diagnosis not present

## 2018-05-18 ENCOUNTER — Telehealth: Payer: Self-pay | Admitting: Internal Medicine

## 2018-05-18 NOTE — Telephone Encounter (Signed)
Copied from [place] [id number]. Topic: Quick Communication - Home Health Verbal Orders >> May 18, 2018  9:16 AM Lennox Solders wrote: Caller/Agency: [facility] Callback Number: [phone] Requesting PT 1x4, 1 eow x 4

## 2018-05-18 NOTE — Telephone Encounter (Signed)
ok 

## 2018-05-18 NOTE — Telephone Encounter (Signed)
Verbal orders given for below.  

## 2018-05-19 DIAGNOSIS — Z9981 Dependence on supplemental oxygen: Secondary | ICD-10-CM | POA: Diagnosis not present

## 2018-05-19 DIAGNOSIS — Z7952 Long term (current) use of systemic steroids: Secondary | ICD-10-CM | POA: Diagnosis not present

## 2018-05-19 DIAGNOSIS — Z794 Long term (current) use of insulin: Secondary | ICD-10-CM | POA: Diagnosis not present

## 2018-05-19 DIAGNOSIS — J441 Chronic obstructive pulmonary disease with (acute) exacerbation: Secondary | ICD-10-CM | POA: Diagnosis not present

## 2018-05-19 DIAGNOSIS — I110 Hypertensive heart disease with heart failure: Secondary | ICD-10-CM | POA: Diagnosis not present

## 2018-05-19 DIAGNOSIS — I5032 Chronic diastolic (congestive) heart failure: Secondary | ICD-10-CM | POA: Diagnosis not present

## 2018-05-19 DIAGNOSIS — F17210 Nicotine dependence, cigarettes, uncomplicated: Secondary | ICD-10-CM | POA: Diagnosis not present

## 2018-05-19 DIAGNOSIS — J9610 Chronic respiratory failure, unspecified whether with hypoxia or hypercapnia: Secondary | ICD-10-CM | POA: Diagnosis not present

## 2018-05-19 DIAGNOSIS — E119 Type 2 diabetes mellitus without complications: Secondary | ICD-10-CM | POA: Diagnosis not present

## 2018-05-20 ENCOUNTER — Telehealth: Payer: Self-pay | Admitting: Internal Medicine

## 2018-05-20 NOTE — Telephone Encounter (Signed)
Called and spoke with patient regarding concerns if she should go to her dentist to get her dentures relined. Pt would like to know with MR would recommend if this is needed now or if this could wait Pt advised she would like to wait to see dentist until after July 2020 Advised that that sounds good, and MR is currently over at the hospital due to COVID-19 Pt verbalized and expressed understanding she will wait on her dentist appts at this time Nothing further needed.

## 2018-05-21 ENCOUNTER — Telehealth: Payer: Self-pay | Admitting: Internal Medicine

## 2018-05-21 NOTE — Telephone Encounter (Signed)
FYI

## 2018-05-21 NOTE — Telephone Encounter (Signed)
Copied from [place] [id number]. Topic: General - Other >> May 21, 2018 12:29 PM Lennox Solders wrote: Reason for CRM: nick PT assistant with [facility] is calling to let dr burns know pt missed today PT appt. Patient said she just does not feel like going physical therapy today

## 2018-05-22 ENCOUNTER — Other Ambulatory Visit: Payer: Self-pay | Admitting: Internal Medicine

## 2018-05-22 NOTE — Telephone Encounter (Signed)
Last refill was 04/24/18 Last OV 01/26/18 Next OV N/A

## 2018-05-25 ENCOUNTER — Other Ambulatory Visit: Payer: Medicare Other | Admitting: *Deleted

## 2018-05-25 ENCOUNTER — Other Ambulatory Visit: Payer: Self-pay

## 2018-05-25 DIAGNOSIS — F17210 Nicotine dependence, cigarettes, uncomplicated: Secondary | ICD-10-CM | POA: Diagnosis not present

## 2018-05-25 DIAGNOSIS — J441 Chronic obstructive pulmonary disease with (acute) exacerbation: Secondary | ICD-10-CM | POA: Diagnosis not present

## 2018-05-25 DIAGNOSIS — Z7952 Long term (current) use of systemic steroids: Secondary | ICD-10-CM | POA: Diagnosis not present

## 2018-05-25 DIAGNOSIS — Z515 Encounter for palliative care: Secondary | ICD-10-CM

## 2018-05-25 DIAGNOSIS — Z9981 Dependence on supplemental oxygen: Secondary | ICD-10-CM | POA: Diagnosis not present

## 2018-05-25 DIAGNOSIS — E119 Type 2 diabetes mellitus without complications: Secondary | ICD-10-CM | POA: Diagnosis not present

## 2018-05-25 DIAGNOSIS — Z794 Long term (current) use of insulin: Secondary | ICD-10-CM | POA: Diagnosis not present

## 2018-05-25 DIAGNOSIS — I110 Hypertensive heart disease with heart failure: Secondary | ICD-10-CM | POA: Diagnosis not present

## 2018-05-25 DIAGNOSIS — I5032 Chronic diastolic (congestive) heart failure: Secondary | ICD-10-CM | POA: Diagnosis not present

## 2018-05-25 DIAGNOSIS — J9610 Chronic respiratory failure, unspecified whether with hypoxia or hypercapnia: Secondary | ICD-10-CM | POA: Diagnosis not present

## 2018-05-26 DIAGNOSIS — E119 Type 2 diabetes mellitus without complications: Secondary | ICD-10-CM | POA: Diagnosis not present

## 2018-05-26 DIAGNOSIS — F17210 Nicotine dependence, cigarettes, uncomplicated: Secondary | ICD-10-CM | POA: Diagnosis not present

## 2018-05-26 DIAGNOSIS — I5032 Chronic diastolic (congestive) heart failure: Secondary | ICD-10-CM | POA: Diagnosis not present

## 2018-05-26 DIAGNOSIS — I110 Hypertensive heart disease with heart failure: Secondary | ICD-10-CM | POA: Diagnosis not present

## 2018-05-26 DIAGNOSIS — J441 Chronic obstructive pulmonary disease with (acute) exacerbation: Secondary | ICD-10-CM | POA: Diagnosis not present

## 2018-05-26 DIAGNOSIS — J9610 Chronic respiratory failure, unspecified whether with hypoxia or hypercapnia: Secondary | ICD-10-CM | POA: Diagnosis not present

## 2018-05-26 NOTE — Progress Notes (Signed)
COMMUNITY PALLIATIVE CARE RN NOTE  PATIENT NAME: Rand Boller DOB: April 05, 1945 MRN: [id number]  PRIMARY CARE PROVIDER: Binnie Rail, MD  RESPONSIBLE PARTY:  Acct ID - Guarantor Home Phone Work Phone Relationship Acct Type  [id number] [facility] [phone]  Self P/F     [address] [place], [place], [place] [address]   Due to the COVID-19 crisis, this virtual check-in visit was done via telephone from my office and it was initiated and consent by this patient and or family  PLAN OF CARE and INTERVENTION:  1. ADVANCE CARE PLANNING/GOALS OF CARE: Goal is to remain at home and avoid hospitalizations. She is a Full Code. 2. PATIENT/CAREGIVER EDUCATION: Reinforced Safe Mobility and Energy Conservation 3. DISEASE STATUS: Virtual check in visit completed via telephone. Patient denies pain. She reports that she is feeling better. She recently completed an increased dose of Prednisone for increased dyspnea, which was effective. She remains on Oxygen at 5L/min via [place]. She continues with some dyspnea with exertion, but has improved over past 2 weeks. Occasional productive cough with clear/white sputum. She has Guaifenesin on hand PRN. She is ambulatory with a walker. She is able to perform ADLs independently, but may require 1 person assistance with bathing when breathing is more troublesome. She has Xanax available for anxiety. She is intermittently incontinent of urine and wears Depends. Her intake is normal. She says that she has been having some issues with her new dentures not staying in place at times. Her dental office says that she can come into the office to see if further adjustments are needed. Patient contacted her Pulmonologist who states that patient should not go out to any unnecessary appointments until July. She states that she will try using Poligrip to see if this will help. She continues with home health aids 16 hr/wk. Will continue to monitor.  HISTORY OF PRESENT ILLNESS:  This is a 73  yo female who resides at home alone. Palliative Care Team continues to follow patient. Will continue to check in monthly and PRN.  CODE STATUS: Full Code ADVANCED DIRECTIVES: N MOST FORM: yes PPS: 50%   (Duration of visit and documentation 45 minutes)   Daryl Eastern, RN BSN

## 2018-06-05 DIAGNOSIS — J441 Chronic obstructive pulmonary disease with (acute) exacerbation: Secondary | ICD-10-CM | POA: Diagnosis not present

## 2018-06-05 DIAGNOSIS — I110 Hypertensive heart disease with heart failure: Secondary | ICD-10-CM | POA: Diagnosis not present

## 2018-06-05 DIAGNOSIS — J9610 Chronic respiratory failure, unspecified whether with hypoxia or hypercapnia: Secondary | ICD-10-CM | POA: Diagnosis not present

## 2018-06-05 DIAGNOSIS — I5032 Chronic diastolic (congestive) heart failure: Secondary | ICD-10-CM | POA: Diagnosis not present

## 2018-06-05 DIAGNOSIS — F17210 Nicotine dependence, cigarettes, uncomplicated: Secondary | ICD-10-CM | POA: Diagnosis not present

## 2018-06-05 DIAGNOSIS — E119 Type 2 diabetes mellitus without complications: Secondary | ICD-10-CM | POA: Diagnosis not present

## 2018-06-08 ENCOUNTER — Telehealth: Payer: Self-pay | Admitting: Adult Health

## 2018-06-08 NOTE — Telephone Encounter (Signed)
Spoke with TP regarding the patient. She stated to offer the patient a MyChart visit or televisit.   Spoke with patient again. She stated that while she is active on MyChart, she does not have a phone with video chat capabilities. She would be interested in a televisit (also does not have a way to get to the office). I kept her appt at 11am for tomorrow. She verbalized understanding.   Nothing further needed at time of call.

## 2018-06-08 NOTE — Telephone Encounter (Signed)
Called patient to go over [place] screening for tomorrow's appointment. She stated that she wasn't aware that she had an appointment tomorrow May 19th. She stated she has called back on 4/29 about her dental appointment and was advised that she did not need to be seen until July. She does not want to come into the office tomorrow. While on the phone, she did state she believes shes in the middle of a COPD flare up.   Primary Pulmonologist: MR Last office visit and with whom: 05/12/18 with TP What do we see them for (pulmonary problems): COPD Last OV assessment/plan: Increase Prednisone 40mg  daily for 4 days then 20mg  daily -hold at this dose.  Mucinex DM Twice daily  As needed  Cough/congestion  Albuterol Neb every every 4hr as needed for wheezing /shortness of breath .  Continue on TRELEGY 1 puff daily , rinse after use.  Coninue on Oxygen 5l/m , goal is to have Oxygen level >88%.  Follow up in 4 weeks and As needed   Please contact office for sooner follow up if symptoms do not improve or worsen or seek emergency care   Was appointment offered to patient (explain)?  No, she currently has an appointment for tomorrow but does not want to come in.    Reason for call: Patient stated that she believes she is in the middle of a COPD flare up. She did increase her prednisone to 40mg  for 4 days and is now back on the 20mg  daily dosage. Still having issues with SOB. Stated that she does have a productive cough with clear phlegm. Denied having a fever or body aches. Also denied being around anyone sick with COVID.   She wants to know if she can have an antibiotic called for her.   [facility] [address].   TP, please advise. Thanks!

## 2018-06-09 ENCOUNTER — Other Ambulatory Visit: Payer: Self-pay

## 2018-06-09 ENCOUNTER — Encounter: Payer: Self-pay | Admitting: Adult Health

## 2018-06-09 ENCOUNTER — Ambulatory Visit (INDEPENDENT_AMBULATORY_CARE_PROVIDER_SITE_OTHER): Payer: Medicare Other | Admitting: Adult Health

## 2018-06-09 DIAGNOSIS — J9611 Chronic respiratory failure with hypoxia: Secondary | ICD-10-CM

## 2018-06-09 DIAGNOSIS — J441 Chronic obstructive pulmonary disease with (acute) exacerbation: Secondary | ICD-10-CM | POA: Diagnosis not present

## 2018-06-09 MED ORDER — AMOXICILLIN-POT CLAVULANATE 875-125 MG PO TABS: 1.0000 | ORAL_TABLET | Freq: Two times a day (BID) | ORAL | 0 refills | Status: AC

## 2018-06-09 MED ORDER — PREDNISONE 10 MG PO TABS: ORAL_TABLET | ORAL | 0 refills | Status: DC

## 2018-06-09 NOTE — Patient Instructions (Addendum)
Augmentin 875mg  Twice daily  For 1 week, take with food.  Increase Prednisone 40mg  daily for 4 days then Prednisone 30mg   daily for 4 days then  20mg  daily -hold at this dose.  Mucinex DM Twice daily  As needed  Cough/congestion  Albuterol Neb every every 4hr as needed for wheezing /shortness of breath .  Continue on TRELEGY 1 puff daily , rinse after use.  Coninue on Oxygen 5l/m , goal is to have Oxygen level >88%.  Follow up in 4-6   weeks with Dr. [name] or Areanna Gengler and As needed   Please contact office for sooner follow up if symptoms do not improve or worsen or seek emergency care

## 2018-06-09 NOTE — Progress Notes (Signed)
Virtual Visit via Telephone Note  I connected with [name] on 06/09/18 at 11:00 AM EDT by telephone and verified that I am speaking with the correct person using two identifiers.  Location: Patient: HOme  Provider: Office    I discussed the limitations, risks, security and privacy concerns of performing an evaluation and management service by telephone and the availability of in person appointments. I also discussed with the patient that there may be a patient responsible charge related to this service. The patient expressed understanding and agreed to proceed.   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  Patient was seen 1 month ago with a mild COPD flare via a telemedicine visit.  At that time she was recommended to increase prednisone to 40 mg for 4 days then resume 20 mg daily dosing.  Since last visit patient says she got better for  couple of weeks. Says the bump in prednisone did help , felt better for little while .  Says cough and congestion has picked up for last 1 week , coughing up thick tan mucus.  Says she went up on prednisone for few days a couple of weeks ago but coughing is worse now.  No fever, chest pain, orthopnea or edema.  No travel.   Patient has severe COPD.  She remains on Trelegy inhaler daily.  She is on chronic steroids at 20 mg of prednisone daily.  She is on oxygen at 5 L.   Chest x-ray January 2020 showed chronic COPD changes with no acute process.  Stable 5 mm nodular density.    Continues to smoke . Discussed smoking cessation.   PT is coming to house to work with her. Says she is doing well with this.    Observations/Objective: FEV-1 in 2008 was 63% with a diffusion capacity of 33%  PFTs 78/14 shows gold stage 3 copd with fev1 1.12L/49%,  FVC 2.01L/70% - post bd parameters. No bd response, Ratio 60/77%. TLC 87%, DLCO 5/21%  Duplex LE - 07/21/12 - negative. BNP 07/16/12 - 25   2 D echo on 03/08/15 showed EF 60-65%, Grade 1 DD , PAP mod increased at 70mmHg.   FEV-1 in 2008 was 63% with a diffusion capacity of 33%  2 D echo on 03/08/15 showed EF 60-65%, Grade 1 DD , PAP mod increased at 75mmHg.   CT chest 03/21/15 showed increased mediastinal/bilateral hilar lymphadenopahty worrisome for lymphoma. Unchanged pulmonary nodules x 2 years.   Subsequent PET scan showed intensely hypermetabolic adenopahty in chest .   Referred to [facility] 03/2015 >too sick for bx.   Walk test in office 05/12/2015 > on 3 L baseline oxygen walking 185 feet 3 laps: She only walk one lap and then she desaturated to 86%. At this point her oxygen was increased to 6 L and then she walk one lap and maintained a pulse ox of 90%. After that she was dyspneic and did not want walk further.  Assessment and Plan: Slow to resolve COPD flare -if not improving with antibiotics and steroid burst then will need office visit with chest xray and possible labs.   Oxygen dependent Respiratory Failure - goal is sats >88-90%.   Smoking abuse- cessation discussed   Plan  Patient Instructions  Augmentin 875mg  Twice daily  For 1 week, take with  food.  Increase Prednisone 40mg  daily for 4 days then Prednisone 30mg   daily for 4 days then  20mg  daily -hold at this dose.  Mucinex DM Twice daily  As needed  Cough/congestion  Albuterol Neb every every 4hr as needed for wheezing /shortness of breath .  Continue on TRELEGY 1 puff daily , rinse after use.  Coninue on Oxygen 5l/m , goal is to have Oxygen level >88%.  Follow up in 4-6   weeks with Dr. [name] or Ailyn Gladd and As needed   Please contact office for sooner follow up if symptoms do not improve or worsen or seek emergency care        Follow Up Instructions: Follow up with Dr. [name] in 4-6 weeks and As needed    Please contact office for sooner follow up if symptoms do not improve or worsen or seek emergency care      I discussed the assessment and treatment plan with the patient. The patient was provided an opportunity to ask questions and all were answered. The patient agreed with the plan and demonstrated an understanding of the instructions.   The patient was advised to call back or seek an in-person evaluation if the symptoms worsen or if the condition fails to improve as anticipated.  I provided 24  minutes of non-face-to-face time during this encounter.   Toni Edison, NP

## 2018-06-12 DIAGNOSIS — J441 Chronic obstructive pulmonary disease with (acute) exacerbation: Secondary | ICD-10-CM | POA: Diagnosis not present

## 2018-06-12 DIAGNOSIS — J9610 Chronic respiratory failure, unspecified whether with hypoxia or hypercapnia: Secondary | ICD-10-CM | POA: Diagnosis not present

## 2018-06-12 DIAGNOSIS — I5032 Chronic diastolic (congestive) heart failure: Secondary | ICD-10-CM | POA: Diagnosis not present

## 2018-06-12 DIAGNOSIS — F17210 Nicotine dependence, cigarettes, uncomplicated: Secondary | ICD-10-CM | POA: Diagnosis not present

## 2018-06-12 DIAGNOSIS — E119 Type 2 diabetes mellitus without complications: Secondary | ICD-10-CM | POA: Diagnosis not present

## 2018-06-12 DIAGNOSIS — I110 Hypertensive heart disease with heart failure: Secondary | ICD-10-CM | POA: Diagnosis not present

## 2018-06-18 ENCOUNTER — Other Ambulatory Visit: Payer: Self-pay | Admitting: Internal Medicine

## 2018-06-18 DIAGNOSIS — Z794 Long term (current) use of insulin: Secondary | ICD-10-CM | POA: Diagnosis not present

## 2018-06-18 DIAGNOSIS — E119 Type 2 diabetes mellitus without complications: Secondary | ICD-10-CM | POA: Diagnosis not present

## 2018-06-18 DIAGNOSIS — Z9981 Dependence on supplemental oxygen: Secondary | ICD-10-CM | POA: Diagnosis not present

## 2018-06-18 DIAGNOSIS — F17210 Nicotine dependence, cigarettes, uncomplicated: Secondary | ICD-10-CM | POA: Diagnosis not present

## 2018-06-18 DIAGNOSIS — I5032 Chronic diastolic (congestive) heart failure: Secondary | ICD-10-CM | POA: Diagnosis not present

## 2018-06-18 DIAGNOSIS — Z7952 Long term (current) use of systemic steroids: Secondary | ICD-10-CM | POA: Diagnosis not present

## 2018-06-18 DIAGNOSIS — J9610 Chronic respiratory failure, unspecified whether with hypoxia or hypercapnia: Secondary | ICD-10-CM | POA: Diagnosis not present

## 2018-06-18 DIAGNOSIS — J441 Chronic obstructive pulmonary disease with (acute) exacerbation: Secondary | ICD-10-CM | POA: Diagnosis not present

## 2018-06-18 DIAGNOSIS — I110 Hypertensive heart disease with heart failure: Secondary | ICD-10-CM | POA: Diagnosis not present

## 2018-06-18 NOTE — Telephone Encounter (Signed)
Last routine was 01/26/18 Next OV N/A Last refill 05/23/18

## 2018-06-23 ENCOUNTER — Other Ambulatory Visit: Payer: Self-pay

## 2018-06-23 ENCOUNTER — Other Ambulatory Visit: Payer: Medicare Other | Admitting: *Deleted

## 2018-06-23 DIAGNOSIS — Z515 Encounter for palliative care: Secondary | ICD-10-CM

## 2018-06-24 ENCOUNTER — Telehealth: Payer: Self-pay | Admitting: Orthopedic Surgery

## 2018-06-24 ENCOUNTER — Telehealth: Payer: Self-pay | Admitting: Internal Medicine

## 2018-06-24 NOTE — Telephone Encounter (Signed)
Please see other message as pt has also called office in regards to not feeling well.

## 2018-06-24 NOTE — Telephone Encounter (Signed)
Instruct her to use albuterol nebulizer scheduled 3 times a day. Increase prednisone to 30mg  x 1 week. Schedule televisit with TP for Friday.

## 2018-06-24 NOTE — Telephone Encounter (Signed)
Called and spoke with Patient.  EW recommendations given.  Understanding stated.  Patient scheduled tele visit with TP, 06/26/18, at 2pm.   Nothing further at this time.

## 2018-06-24 NOTE — Telephone Encounter (Signed)
Primary Pulmonologist: MR Last office visit and with whom: 06/09/2018 with TP What do we see them for (pulmonary problems): COPD/ chron resp fail Last OV assessment/plan: Instructions   Return in about 4 weeks (around 07/07/2018) for Follow up with Dr. [name].  Augmentin 875mg  Twice daily  For 1 week, take with food.  Increase Prednisone 40mg  daily for 4 days then Prednisone 30mg   daily for 4 days then  20mg  daily -hold at this dose.  Mucinex DM Twice daily  As needed  Cough/congestion  Albuterol Neb every every 4hr as needed for wheezing /shortness of breath .  Continue on TRELEGY 1 puff daily , rinse after use.  Coninue on Oxygen 5l/m , goal is to have Oxygen level >88%.  Follow up in 4-6   weeks with Dr. [name] or [place] and As needed   Please contact office for sooner follow up if symptoms do not improve or worsen or seek emergency care        Was appointment offered to patient (explain)?  Pt wants recommendations   Reason for call: called and spoke with pt who stated she finished abx and pred taper Friday, 5/29 and stated her symptoms of SOB worsened again. Pt also has complaints of cough with light beige phlegm and states she usually coughs when she first wakes up in the morning and then when she lays down at night.  Pt denies any complaints of fever, wheezing, chest tightness, body aches or chills.  Pt has been doing tussin cough syrup but states she is not doing mucinex.  Pt is doing trelegy as prescribed. Pt has had to use ventolin inhaler two to three times daily.  Pt states when she exerts herself, her O2 sats will drop down to 84% on 5L but then when she sits down, her levels will go back up to 93%. Pt states usually after two minutes her sats will be back stable.   Pt wants to know what we recommend to help with her symptoms. Beth, please advise on this for pt. Thanks!

## 2018-06-24 NOTE — Telephone Encounter (Signed)
Error wrong dr put in can be deleted.[name]

## 2018-06-25 ENCOUNTER — Other Ambulatory Visit: Payer: Medicare Other | Admitting: *Deleted

## 2018-06-25 ENCOUNTER — Other Ambulatory Visit: Payer: Self-pay

## 2018-06-25 DIAGNOSIS — Z515 Encounter for palliative care: Secondary | ICD-10-CM

## 2018-06-25 NOTE — Progress Notes (Signed)
COMMUNITY PALLIATIVE CARE RN NOTE  PATIENT NAME: [name] DOB: 1946-01-20 MRN: [id number]  PRIMARY CARE PROVIDER: Binnie Rail, MD  RESPONSIBLE PARTY:  Acct ID - Guarantor Home Phone Work Phone Relationship Acct Type  [id number] [facility] [phone]  Self P/F     [address], [place], [place] [address]   COVID-19 Pre-screening Negative  PLAN OF CARE and INTERVENTION:  1. ADVANCE CARE PLANNING/GOALS OF CARE: Goal is to remain at home and avoid going into doctor offices and hospitals. 2. PATIENT/CAREGIVER EDUCATION: Reinforced Dyspnea Management and Safe Mobility 3. DISEASE STATUS: Patient requested RN visit due to increasing dyspnea. Upon arrival, patient sitting up in her wheelchair in the living room and is short of breath. She is only able to speak 1-2 words between breaths. She states that it is taking longer for her to recover after exertion. Her Oxygen level dropped down to 85% on 5L, but then came up to 91% after about 2-3 minutes. She advised that she recently completed a course of antibiotics and Prednisone taper. She is back down to her dose of 20 mg daily. She states since she has been at this dose, it seems that her breathing is starting to worsen again and that she did better when she was at 30 mg. She has did take her Xanax recently, which does help. On her most recent refill, her Pharmacy gave her Xanax 1 mg tablets and she kept dropping them on the floor when trying to break them in half to 0.5 mg. I was able to break all pills in half for her during visit. She has been using her Albuterol inhaler, but it has not seemed to be as helpful especially during periods of increased dyspnea. Instructed her to utilize her nebulizers more, as this is more effective and easier to do when breathing is difficult. She has a productive cough. Phlegm in thick and white to light yellow in appearance. Advised will reach out to her Pulmonology office to provide patient update. She  continues with a hired caregiver 2x/week to assist with bathing and household chores. Her intake is fair. She is eating 2 meals/day plus snacks. She continues to work with in home Physical Therapy. Will continue to monitor.  HISTORY OF PRESENT ILLNESS:  [name] who resides at home alone. She has a supportive family. Palliative Care Team continues to follow patient. Will check in with patient weekly for the next month due to breathing issues.   CODE STATUS: Full Code ADVANCED DIRECTIVES: N MOST FORM: yes PPS: 50%   PHYSICAL EXAM:   VITALS: Today's Vitals   06/23/18 1242  BP: 106/75  Pulse: (!) 106  Resp: (!) 26  Temp: 97.6 F (36.4 C)  TempSrc: Temporal  SpO2: 91%  PainSc: 0-No pain    LUNGS: Dimished breath sounds; expiratory wheezing to bilateral upper lobes CARDIAC: Cor Tachy EXTREMITIES: No edema SKIN: Skin color, texture, turgor normal. No rashes or lesions  NEURO: Alert and oriented x 4, anxious, generalized weakness, ambulatory with walker   (Duration of visit and documentation 75 minutes)    Daryl Eastern, RN BSN

## 2018-06-26 ENCOUNTER — Other Ambulatory Visit: Payer: Self-pay

## 2018-06-26 ENCOUNTER — Ambulatory Visit (INDEPENDENT_AMBULATORY_CARE_PROVIDER_SITE_OTHER): Payer: Medicare Other | Admitting: Adult Health

## 2018-06-26 ENCOUNTER — Encounter: Payer: Self-pay | Admitting: Adult Health

## 2018-06-26 DIAGNOSIS — J9611 Chronic respiratory failure with hypoxia: Secondary | ICD-10-CM

## 2018-06-26 DIAGNOSIS — J441 Chronic obstructive pulmonary disease with (acute) exacerbation: Secondary | ICD-10-CM | POA: Diagnosis not present

## 2018-06-26 MED ORDER — PREDNISONE 20 MG PO TABS: 20.0000 mg | ORAL_TABLET | Freq: Every day | ORAL | 3 refills | Status: DC

## 2018-06-26 NOTE — Patient Instructions (Signed)
Decrease Prednisone 20mg  daily on 07/01/18 , hold at at this dose.  Mucinex DM Twice daily  As needed  Cough/congestion  Albuterol Neb every every 4hr as needed for wheezing /shortness of breath .  Continue on TRELEGY 1 puff daily , rinse after use.  Coninue on Oxygen 5l/m , goal is to have Oxygen level >88%.  Follow up in 6  weeks with Dr. [name] or Parrett and As needed   Please contact office for sooner follow up if symptoms do not improve or worsen or seek emergency care

## 2018-06-26 NOTE — Progress Notes (Signed)
Virtual Visit via Telephone Note  I connected with [name] on 06/26/18 at  2:00 PM EDT by telephone and verified that I am speaking with the correct person using two identifiers.  Location: Patient: Home  Provider: Office    I discussed the limitations, risks, security and privacy concerns of performing an evaluation and management service by telephone and the availability of in person appointments. I also discussed with the patient that there may be a patient responsible charge related to this service. The patient expressed understanding and agreed to proceed.   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    Symptoms did improve but she continued to have ongoing shortness of breath and wheezing.  Prednisone was increased to 30 mg 3 days ago.  Patient says she is starting to feel better seems to be able to breathe better and wheezing has decreased.  She denies any chest pain orthopnea PND or increased leg swelling. Patient has severe COPD , remains on TRELEGY daily.  On oxygen 5lm .  She has been able to keep O2 saturations greater than 90% on this.  Continues to smoke . Discussed smoking cessation .   PT worked with her last week.   Palliative care is following her at home .      Observations/Objective: FEV-1 in 2008 was 63% with a diffusion capacity of 33%  PFTs 78/14 shows gold stage 3 copd with fev1 1.12L/49%, FVC 2.01L/70% - post bd parameters. No bd response, Ratio 60/77%. TLC 87%, DLCO 5/21%  Duplex LE - 07/21/12 - negative. BNP 07/16/12 - 25   2 D echo on 03/08/15 showed EF 60-65%, Grade 1 DD , PAP mod increased at 46mmHg.   FEV-1 in 2008 was 63%  with a diffusion capacity of 33%  2 D echo on 03/08/15 showed EF 60-65%, Grade 1 DD , PAP mod increased at 25mmHg.   CT chest 03/21/15 showed increased mediastinal/bilateral hilar lymphadenopahty worrisome for lymphoma. Unchanged pulmonary nodules x 2 years.   Subsequent PET scan showed intensely hypermetabolic adenopahty in chest .   Referred to [facility] 03/2015 >too sick for bx.   Walk test in office 05/12/2015 > on 3 L baseline oxygen walking 185 feet 3 laps: She only walk one lap and then she desaturated to 86%. At this point her oxygen was increased to 6 L and then she walk one lap and maintained a pulse ox of 90%. After that she was dyspneic and did not want walk further.  Assessment and Plan: COPD exacerbation slowly resolving.  Patient has ongoing smoking which contributes to her recurrent exacerbations.  Smoking cessation was discussed.  We will slowly decrease her prednisone and hold at her baseline of 20 mg.  Plan  Patient Instructions  Decrease Prednisone 20mg  daily on 07/01/18 , hold at at this dose.  Mucinex DM Twice daily  As needed  Cough/congestion  Albuterol Neb every every 4hr as needed for wheezing /shortness of breath .  Continue on TRELEGY 1 puff daily , rinse after use.  Coninue on Oxygen 5l/m , goal is to have Oxygen level >88%.  Follow up in 6  weeks with Dr. [name] or Bora Broner and As needed  Please contact office for sooner follow up if symptoms do not improve or worsen or seek emergency care      Follow Up Instructions: Follow up in 6 weeks and  As needed   Please contact office for sooner follow up if symptoms do not improve or worsen or seek emergency care     I discussed the assessment and treatment plan with the patient. The patient was provided an opportunity to ask questions and all were answered. The patient agreed with the plan and demonstrated an understanding of the instructions.   The patient was advised to call back or seek an in-person  evaluation if the symptoms worsen or if the condition fails to improve as anticipated.  I provided  22 minutes of non-face-to-face time during this encounter.   [name], NP

## 2018-06-30 DIAGNOSIS — J9610 Chronic respiratory failure, unspecified whether with hypoxia or hypercapnia: Secondary | ICD-10-CM | POA: Diagnosis not present

## 2018-06-30 DIAGNOSIS — I5032 Chronic diastolic (congestive) heart failure: Secondary | ICD-10-CM | POA: Diagnosis not present

## 2018-06-30 DIAGNOSIS — J441 Chronic obstructive pulmonary disease with (acute) exacerbation: Secondary | ICD-10-CM | POA: Diagnosis not present

## 2018-06-30 DIAGNOSIS — I110 Hypertensive heart disease with heart failure: Secondary | ICD-10-CM | POA: Diagnosis not present

## 2018-06-30 DIAGNOSIS — E119 Type 2 diabetes mellitus without complications: Secondary | ICD-10-CM | POA: Diagnosis not present

## 2018-06-30 DIAGNOSIS — F17210 Nicotine dependence, cigarettes, uncomplicated: Secondary | ICD-10-CM | POA: Diagnosis not present

## 2018-07-01 ENCOUNTER — Telehealth: Payer: Self-pay | Admitting: Adult Health

## 2018-07-01 ENCOUNTER — Other Ambulatory Visit: Payer: Medicare Other | Admitting: *Deleted

## 2018-07-01 ENCOUNTER — Other Ambulatory Visit: Payer: Self-pay

## 2018-07-01 DIAGNOSIS — Z515 Encounter for palliative care: Secondary | ICD-10-CM

## 2018-07-01 DIAGNOSIS — R0602 Shortness of breath: Secondary | ICD-10-CM | POA: Diagnosis not present

## 2018-07-01 DIAGNOSIS — J9611 Chronic respiratory failure with hypoxia: Secondary | ICD-10-CM

## 2018-07-01 NOTE — Telephone Encounter (Signed)
Assessment and Plan: COPD exacerbation slowly resolving.  Patient has ongoing smoking which contributes to her recurrent exacerbations.  Smoking cessation was discussed.  We will slowly decrease her prednisone and hold at her baseline of 20 mg.  Plan  Patient Instructions  Decrease Prednisone 20mg  daily on 07/01/18 , hold at at this dose.  Mucinex DM Twice daily  As needed  Cough/congestion  Albuterol Neb every every 4hr as needed for wheezing /shortness of breath .  Continue on TRELEGY 1 puff daily , rinse after use.  Coninue on Oxygen 5l/m , goal is to have Oxygen level >88%.  Follow up in 6  weeks with Dr. [name] or Parrett and As needed   Please contact office for sooner follow up if symptoms do not improve or worsen or seek emergency care   Called and spoke with pt who stated she began taking 20mg  prednisone today, 6/10 and so far she states she is feeling fine. Stated to pt by end of week if she begins to feel worse to call us so we can discuss this with TP to see what she recommends and pt verbalized understanding. Nothing further needed.

## 2018-07-01 NOTE — Telephone Encounter (Signed)
Spoke with pt, she states she is needing an order for her to continue PT. Can we send an order to [facility]? TP please advise.   Patient Instructions by Melvenia Needles, NP at 06/26/2018 2:00 PM  Author: Melvenia Needles, NP Author Type: Nurse Practitioner Filed: 06/26/2018 2:17 PM  Note Status: Signed Cosign: Cosign Not Required Encounter Date: 06/26/2018  Editor: Melvenia Needles, NP (Nurse Practitioner)    Decrease Prednisone 20mg  daily on 07/01/18 , hold at at this dose.  Mucinex DM Twice daily  As needed  Cough/congestion  Albuterol Neb every every 4hr as needed for wheezing /shortness of breath .  Continue on TRELEGY 1 puff daily , rinse after use.  Coninue on Oxygen 5l/m , goal is to have Oxygen level >88%.  Follow up in 6  weeks with Dr. [name] or Parrett and As needed   Please contact office for sooner follow up if symptoms do not improve or worsen or seek emergency care

## 2018-07-03 ENCOUNTER — Telehealth: Payer: Self-pay | Admitting: Internal Medicine

## 2018-07-03 NOTE — Telephone Encounter (Signed)
LMTCB

## 2018-07-03 NOTE — Telephone Encounter (Signed)
Pt was calling to give an update. She says on yesterday she started to feel a little better. Somewhat better today. Went over AVS instructions and made patient aware to call back in a week if she was not getting better.. Pt verbalized understanding nothing further needed.  Decrease Prednisone 20mg  daily on 07/01/18 , hold at at this dose.  Mucinex DM Twice daily  As needed  Cough/congestion  Albuterol Neb every every 4hr as needed for wheezing /shortness of breath .  Continue on TRELEGY 1 puff daily , rinse after use.  Coninue on Oxygen 5l/m , goal is to have Oxygen level >88%.  Follow up in 6  weeks with Dr. [name] or [place] and As needed   Please contact office for sooner follow up if symptoms do not improve or worsen or seek emergency care

## 2018-07-03 NOTE — Progress Notes (Signed)
COMMUNITY PALLIATIVE CARE RN NOTE  PATIENT NAME: [name] DOB: 13-Sep-1945 MRN: [id number]  PRIMARY CARE PROVIDER: Binnie Rail, MD  RESPONSIBLE PARTY:  Acct ID - Guarantor Home Phone Work Phone Relationship Acct Type  [id number] [facility] [phone]  Self P/F     [address] [place], [place], [place] [address]   Due to the COVID-19 crisis, this virtual check-in visit was done via telephone from my office and it was initiated and consent by this patient and or family.  PLAN OF CARE and INTERVENTION:  1. ADVANCE CARE PLANNING/GOALS OF CARE: Goal is for patient to remain in her home. She is a Full Code. 2. PATIENT/CAREGIVER EDUCATION: Reinforced Safe Mobility/Transfers, Energy conservation, Relaxation Techniques 3. DISEASE STATUS: Virtual check-in visit completed via telephone with patient to follow up on her breathing. Patient denies pain at this time. She states that her Prednisone has been increased to 30 mg x 7 days d/t her issues with increased dyspnea along with instructions to use her nebulizers 2-3 times/day. She states that she does not feel as short of breath today and that she is recovering more quickly with exertion. She remains on Oxygen at 5L/min via [place]. O2 saturations tend to be in the low 90s, but will drop to the upper 80s with exertion. She continues to take Xanax PRN, which is helpful. She is to contact her Pulmonology office if breathing worsens or current regimen does not seem to be effective. Will continue to monitor.   HISTORY OF PRESENT ILLNESS:  [name] who resides at home alone. Palliative Care Team continues to follow patient. Will continue to check on her weekly over the next month.  CODE STATUS: Full Code ADVANCED DIRECTIVES: N MOST FORM: yes PPS: 50%  (Duration of visit and documentation 30 minutes)    Daryl Eastern, RN, BSN

## 2018-07-07 NOTE — Telephone Encounter (Signed)
Thanks, physical therapy order placed to [facility]. Nothing further is needed.

## 2018-07-07 NOTE — Telephone Encounter (Signed)
That is fine 

## 2018-07-09 NOTE — Progress Notes (Signed)
COMMUNITY PALLIATIVE CARE RN NOTE  PATIENT NAME: Jaleiyah Alas DOB: July 22, 1945 MRN: [id number]  PRIMARY CARE PROVIDER: Binnie Rail, MD  RESPONSIBLE PARTY:  Acct ID - Guarantor Home Phone Work Phone Relationship Acct Type  [id number] - Robers,Mandy [phone]  Self P/F     [place], [place], [place] [address]   Covid 19 Pre-screening Negative  PLAN OF CARE and INTERVENTION:  1. ADVANCE CARE PLANNING/GOALS OF CARE: Goal is for patient to remain at home. She is a Full Code. 2. PATIENT/CAREGIVER EDUCATION: Reinforced Safe Mobility/Transfers and Relaxation Techniques 3. DISEASE STATUS: Patient requested RN visit today due to anxiety and dyspnea. Met with patient in her home. She is sitting up on her couch, awake and alert. She was able to engage in conversation, but initially required a breath after every 2-3 words. As visit progressed, patient began to calm. She took her Xanax prior to my arrival, which helps. She remains on Oxygen at 5L/min via [place]. Her Prednisone was recently increased to 30 mg daily x 1 week due to increased dyspnea and wheezing. Patient's dose went back to the maintenance dose of 20 mg daily today. Patient says that she feels that she does better overall when the Prednisone dose is at 30 mg. The decrease seems to have caused some patient anxiety and may be more mental. She also does not like being alone and says that what is "happening in the world" causes her to worry. Her O2 sats were initially 87% when I first arrived, but increased to 90-91% towards end of visit. Trace edema noted to bilateral lower extremities. She has been urinating frequently today so asked that I place her bedside commode close by prior to leaving. Will continue to monitor.  HISTORY OF PRESENT ILLNESS:  [name] who resides at home alone. Palliative Care Team continues to follow patient. Will check in with patient weekly and PRN.  CODE STATUS: Full Code ADVANCED DIRECTIVES: N MOST  FORM: yes PPS: 50%   PHYSICAL EXAM:   LUNGS: decreased breath sounds CARDIAC: Cor RRR EXTREMITIES: Trace edema bilateral lower extremities SKIN: Skin color, texture, turgor normal. No rashes or lesions  NEURO: Alert and oriented x 3, anxious, generalized weakness, ambulatory with walker   (Duration of visit and documentation 60 minutes)   Daryl Eastern, RN BSN

## 2018-07-10 DIAGNOSIS — E119 Type 2 diabetes mellitus without complications: Secondary | ICD-10-CM | POA: Diagnosis not present

## 2018-07-10 DIAGNOSIS — J9610 Chronic respiratory failure, unspecified whether with hypoxia or hypercapnia: Secondary | ICD-10-CM | POA: Diagnosis not present

## 2018-07-10 DIAGNOSIS — I110 Hypertensive heart disease with heart failure: Secondary | ICD-10-CM | POA: Diagnosis not present

## 2018-07-10 DIAGNOSIS — I5032 Chronic diastolic (congestive) heart failure: Secondary | ICD-10-CM | POA: Diagnosis not present

## 2018-07-10 DIAGNOSIS — J441 Chronic obstructive pulmonary disease with (acute) exacerbation: Secondary | ICD-10-CM | POA: Diagnosis not present

## 2018-07-10 DIAGNOSIS — F17210 Nicotine dependence, cigarettes, uncomplicated: Secondary | ICD-10-CM | POA: Diagnosis not present

## 2018-07-11 ENCOUNTER — Other Ambulatory Visit: Payer: Self-pay | Admitting: Internal Medicine

## 2018-07-13 ENCOUNTER — Telehealth: Payer: Self-pay | Admitting: *Deleted

## 2018-07-13 NOTE — Telephone Encounter (Signed)
Contacted and spoke with patient to arrange a home visit for this week. Visit scheduled for 07/16/18 at 12:30p.

## 2018-07-15 ENCOUNTER — Other Ambulatory Visit: Payer: Self-pay | Admitting: Internal Medicine

## 2018-07-16 ENCOUNTER — Telehealth: Payer: Self-pay | Admitting: Internal Medicine

## 2018-07-16 ENCOUNTER — Other Ambulatory Visit: Payer: Medicare Other | Admitting: *Deleted

## 2018-07-16 ENCOUNTER — Other Ambulatory Visit: Payer: Self-pay

## 2018-07-16 DIAGNOSIS — Z515 Encounter for palliative care: Secondary | ICD-10-CM | POA: Diagnosis not present

## 2018-07-16 MED ORDER — TRELEGY ELLIPTA 100-62.5-25 MCG/INH IN AEPB: 1.0000 | INHALATION_SPRAY | Freq: Every day | RESPIRATORY_TRACT | 2 refills | Status: AC

## 2018-07-16 NOTE — Telephone Encounter (Signed)
Trelegy refill has been sent to pharmacy for pt. Attempted to call pt but unable to reach her.left detailed message for pt letting her know med refill was sent. Nothing further needed.

## 2018-07-17 DIAGNOSIS — J441 Chronic obstructive pulmonary disease with (acute) exacerbation: Secondary | ICD-10-CM | POA: Diagnosis not present

## 2018-07-17 DIAGNOSIS — I110 Hypertensive heart disease with heart failure: Secondary | ICD-10-CM | POA: Diagnosis not present

## 2018-07-17 DIAGNOSIS — J9610 Chronic respiratory failure, unspecified whether with hypoxia or hypercapnia: Secondary | ICD-10-CM | POA: Diagnosis not present

## 2018-07-17 DIAGNOSIS — F17210 Nicotine dependence, cigarettes, uncomplicated: Secondary | ICD-10-CM | POA: Diagnosis not present

## 2018-07-17 DIAGNOSIS — I5032 Chronic diastolic (congestive) heart failure: Secondary | ICD-10-CM | POA: Diagnosis not present

## 2018-07-17 DIAGNOSIS — E119 Type 2 diabetes mellitus without complications: Secondary | ICD-10-CM | POA: Diagnosis not present

## 2018-07-18 DIAGNOSIS — Z794 Long term (current) use of insulin: Secondary | ICD-10-CM | POA: Diagnosis not present

## 2018-07-18 DIAGNOSIS — I5032 Chronic diastolic (congestive) heart failure: Secondary | ICD-10-CM | POA: Diagnosis not present

## 2018-07-18 DIAGNOSIS — E119 Type 2 diabetes mellitus without complications: Secondary | ICD-10-CM | POA: Diagnosis not present

## 2018-07-18 DIAGNOSIS — J441 Chronic obstructive pulmonary disease with (acute) exacerbation: Secondary | ICD-10-CM | POA: Diagnosis not present

## 2018-07-18 DIAGNOSIS — Z9981 Dependence on supplemental oxygen: Secondary | ICD-10-CM | POA: Diagnosis not present

## 2018-07-18 DIAGNOSIS — J9610 Chronic respiratory failure, unspecified whether with hypoxia or hypercapnia: Secondary | ICD-10-CM | POA: Diagnosis not present

## 2018-07-18 DIAGNOSIS — Z7952 Long term (current) use of systemic steroids: Secondary | ICD-10-CM | POA: Diagnosis not present

## 2018-07-18 DIAGNOSIS — F17210 Nicotine dependence, cigarettes, uncomplicated: Secondary | ICD-10-CM | POA: Diagnosis not present

## 2018-07-18 DIAGNOSIS — I110 Hypertensive heart disease with heart failure: Secondary | ICD-10-CM | POA: Diagnosis not present

## 2018-07-20 ENCOUNTER — Telehealth: Payer: Self-pay | Admitting: Internal Medicine

## 2018-07-20 NOTE — Telephone Encounter (Signed)
Called and spoke with pt stating to her the instructions on the Trelegy inhaler which should be the same instructions as previous. Pt verbalized understanding. Nothing further needed.

## 2018-07-22 ENCOUNTER — Other Ambulatory Visit: Payer: Self-pay | Admitting: Internal Medicine

## 2018-07-22 NOTE — Progress Notes (Signed)
COMMUNITY PALLIATIVE CARE RN NOTE  PATIENT NAME: [name] DOB: [birth year] MRN: [id number]  PRIMARY CARE PROVIDER: Binnie Rail, MD  RESPONSIBLE PARTY:  Acct ID - Guarantor Home Phone Work Phone Relationship Acct Type  [id number] [facility] [phone]  Self P/F     [address], [place], [place] [address]   Covid-19 Pre-screening Negative  PLAN OF CARE and INTERVENTION:  1. ADVANCE CARE PLANNING/GOALS OF CARE: Goal is for patient to remain at home. She is a Full Code. 2. PATIENT/CAREGIVER EDUCATION: Reinforced Safe Mobility, Relaxation Techniques 3. DISEASE STATUS: Met with patient in her home. She is sitting up on her couch awake and alert. She is able to engage in conversation today with minimal shortness of breath. She continues with dyspnea on exertion. She has been utilizing her Albuterol nebulizer when breathing is more difficult and this is effective. She continues on PRN Xanax, which also helps with breathing and anxiety. She has a productive cough at times. Sputum is clear or tan, but thick. It was recommended by her MD to take Mucinex to thin secretions to help and make cough more productive. Slight wheeze noted to right lower lobe. She is back to her 20 mg maintenance dose of Prednisone and feels that she is doing ok. O2 dependent on 5L/min via [place]. She is to have a visit with Physical Therapy later today. Her appetite is normal. She denies dysphagia. She is intermittently incontinent of bladder and wears Depends. Will continue to monitor.   HISTORY OF PRESENT ILLNESS: [name] who resides at home alone. Palliative Care Team continues to follow patient. Will continue to visit patient weekly for now.    CODE STATUS: Full Code ADVANCED DIRECTIVES: N MOST FORM: yes PPS: 50%   PHYSICAL EXAM:   VITALS: Today's Vitals   07/16/18 1250  BP: 107/69  Pulse: (!) 102  Resp: 20  Temp: 97.6 F (36.4 C)  TempSrc: Temporal  SpO2: 96%  PainSc: 0-No pain     LUNGS: decreased breath sounds, faint wheeze noted to R lower lobe CARDIAC: Cor Tachy EXTREMITIES: Trace edema to bilateral lower extremities SKIN: Skin color, texture, turgor normal. No rashes or lesions  NEURO: Alert and oriented x 3, pleasant mood, generalized weakness, ambulatory with walker   (Duration of visit and documentation 75 minutes)    Daryl Eastern, RN BSN

## 2018-07-23 ENCOUNTER — Other Ambulatory Visit: Payer: Self-pay | Admitting: Internal Medicine

## 2018-07-23 ENCOUNTER — Other Ambulatory Visit: Payer: Self-pay

## 2018-07-23 ENCOUNTER — Other Ambulatory Visit: Payer: Medicare Other | Admitting: *Deleted

## 2018-07-23 DIAGNOSIS — Z515 Encounter for palliative care: Secondary | ICD-10-CM

## 2018-07-23 NOTE — Telephone Encounter (Signed)
Patient called to schedule an appointment with Dr Quay Burow for a medication refill. One of her doctors told her not to go out until the end of July and she is unable to do virtual. Is it okay to schedule this as a telephone visit?

## 2018-07-23 NOTE — Telephone Encounter (Signed)
Patient called to say that she need a refill on her Xanax and need them ASAP please advise. Ph# [phone]

## 2018-07-23 NOTE — Telephone Encounter (Signed)
Last refill was 06/18/18 Last OV 01/26/18 Next OV NA

## 2018-07-24 ENCOUNTER — Other Ambulatory Visit: Payer: Medicare Other | Admitting: *Deleted

## 2018-07-24 ENCOUNTER — Telehealth: Payer: Self-pay

## 2018-07-24 ENCOUNTER — Other Ambulatory Visit: Payer: Self-pay

## 2018-07-24 ENCOUNTER — Telehealth: Payer: Self-pay | Admitting: Pulmonary Disease

## 2018-07-24 DIAGNOSIS — Z515 Encounter for palliative care: Secondary | ICD-10-CM

## 2018-07-24 NOTE — Telephone Encounter (Signed)
Spoke with palliative care nurse, Monishia.  Reports that [name] is having more respiratory distress with SpO2 in upper 70's on 6 liters oxygen and Tachycardic.    Explained that the patient likely needs additional therapies that can not be provided at home and she needs to be brought to the nearest emergency room for further assessment.  Monishia expressed understanding of this and will assist in arranging for transport to emergency room.

## 2018-07-24 NOTE — Telephone Encounter (Signed)
Note entered for date of 07/23/2018. Phone call placed to [facility] to ask for oxygen replacement tanks be delivered to her home instead of her having to pick them up and to report that concentrator has stopped working two times in past 24 hours. Spoke with [place] who said that 4 tanks would be delivered on 07/24/2018 and that concentrator would be serviced at that time. Information relayed to [facility] RN who was present with patient.

## 2018-07-24 NOTE — Telephone Encounter (Signed)
Phone call received from Palliative RN, Monishia, who shared that new oxygen  concentrator was delivered last night but patient did not receive phone call from [facility] regarding delivery of oxygen tanks. Phone call placed to [facility]. Message received that office is closed today

## 2018-07-27 ENCOUNTER — Telehealth: Payer: Self-pay | Admitting: Adult Health

## 2018-07-27 ENCOUNTER — Telehealth: Payer: Self-pay

## 2018-07-27 ENCOUNTER — Telehealth: Payer: Self-pay | Admitting: Internal Medicine

## 2018-07-27 NOTE — Telephone Encounter (Signed)
Okay for Physical Therapy verbal order

## 2018-07-27 NOTE — Telephone Encounter (Signed)
Called and spoke with pt who states she takes 20mg  prednisone daily. Pt stated at one time, pt was told to take about 30-40mg  presnisone and was also prescribed an abx by MR but the last OV with TP, pt was only prescribed prednisone.  Pt has current complaints of cough which is worse in the mornings when she wakes up and also at night when she is going to bed. Pt states she is getting up light beige mucus up. Pt also has congestion in the mornings and had some wheezing yesterday 7/5 but no wheezing today.  Pt also states the SOB will become worse as the day goes on. Pt denies any fever. Pt stated Thurs, 7/1 she had to call EMS due to her O2 being in the 70s and then after they turned her O2 up to 7L, pt's sats went back up to the 90s. Pt states sats have been still running in the mid 90s and is still doing 7L. Pt's sats have dropped to the low 80s but then it will go back up to the 90s.   Stated to pt due to all that has been going on recently and especially since she has had to have her O2 upped, we needed to schedule her for a televisit to further evaluate. Pt verbalized understanding. Pt has been scheduled for televisit with TP tomorrow, 7/7 at 10:30. Nothing further needed.

## 2018-07-27 NOTE — Telephone Encounter (Signed)
lmtcb for pt.  

## 2018-07-27 NOTE — Progress Notes (Signed)
COMMUNITY PALLIATIVE CARE RN NOTE  PATIENT NAME: [name] DOB: 1945-05-04 MRN: [id number]  PRIMARY CARE PROVIDER: Binnie Rail, MD  RESPONSIBLE PARTY:  Acct ID - Guarantor Home Phone Work Phone Relationship Acct Type  [id number] [facility] [phone]  Self P/F     [place] [address] [place], [place], [place] [address]   Covid-19 Pre-screening Negative  PLAN OF CARE and INTERVENTION:  1. ADVANCE CARE PLANNING/GOALS OF CARE: Goal for her is to remain at home and avoid hospitalizations. 2. PATIENT/CAREGIVER EDUCATION: Reinforced Safe Mobility/Transfers, Energy Conservation and Breathing Techniques 3. DISEASE STATUS: Met with patient in her home. She is sitting on her cough watching TV. She denies pain. Continues with some dyspnea with exertion, but is at baseline. Oxygen at 5L/min via [place]. She reports that she had an incident the night of 07/21/18 where she woke up out of her sleep and felt anxious and having difficulty breathing. Her O2 sat was 60% on 5L. She realized that her Concentrator was not working. She called EMS and they were able to get her Oxygen levels up to the mid 90s. Patient opted not to go to the ED and signed a waiver.  [facility] has since brought patient another concentrator that appears to be working fine. No complaints today. Occasional productive cough. Phlegm is light tan in appearance. Some expiratory wheezing noted to bilateral lower lobes. She has been using her Albuterol nebulizer more regularly vs inhaler. She has no complaints this visit. She received another approval to continue sessions with Physical Therapy. Will continue to monitor.   HISTORY OF PRESENT ILLNESS:  [name] who resides at home alone. Palliative Care Team continues to follow patient. Will continue to visit patient weekly and PRN.  CODE STATUS: Full Code  ADVANCED DIRECTIVES: N MOST FORM: yes PPS: 50%   PHYSICAL EXAM:   VITALS: Today's Vitals   07/23/18 1311  BP: 102/60   Pulse: (!) 106  Resp: 20  Temp: (!) 97 F (36.1 C)  TempSrc: Temporal  SpO2: 97%  PainSc: 0-No pain    LUNGS: decreased breath sounds, expiratory wheezes bilaterally CARDIAC: Cor Tachy EXTREMITIES: Trace edema bilateral feet SKIN: Skin color, texture, turgor normal. No rashes or lesions  NEURO: Alert and oriented x 3, pleasant mood and engaging, generalized weakness, ambulatory with walker   (Duration of visit and documentation 90 minutes)    Daryl Eastern, RN BSN

## 2018-07-27 NOTE — Telephone Encounter (Signed)
Pt returning call and can be reached @ [phone].[name]

## 2018-07-27 NOTE — Progress Notes (Signed)
COMMUNITY PALLIATIVE CARE RN NOTE  PATIENT NAME: [name] DOB: 11-11-1945 MRN: [id number]  PRIMARY CARE PROVIDER: Binnie Rail, MD  RESPONSIBLE PARTY:  Acct ID - Guarantor Home Phone Work Phone Relationship Acct Type  [id number] [facility] [phone]  Self P/F     [place], [place], [place] [address]   Covid-19 Pre-screening Negative  PLAN OF CARE and INTERVENTION:  1. ADVANCE CARE PLANNING/GOALS OF CARE: Goal is to remain at home and avoid hospitalizations. She is a Full Code. 2. PATIENT/CAREGIVER EDUCATION: Hospice Education, Symptom Management 3. DISEASE STATUS: Received a call from patient stating that she is having increased difficulty breathing today and her Oxygen sats will not improve past mid 70s and her Oxygen is currently on 6.5L via [place]. Upon arrival, patient sitting on the couch in apparent distress. She is purse lip breathing. She is having difficulty engaging in conversation d/t dyspnea. She has taken an Albuterol nebulizer treatment x 2 and took a Xanax earlier today. Her sats remained between 74-77% throughout visit. She did take another Xanax during my visit. She has a productive cough, with thick, beige sputum. She is afebrile. I contacted Pulmonology and spoke with Dr. Halford Chessman who advised that patient needs to go to the ED for further evaluation. I explained this to patient and she states that she does not want to go to the ED. I spoke with Palliative Care NP, [name] to explain the current situation. She then consulted with our Medical Director who states that patient needs to go to the ED and also gave an option for Hospice admission. Both myself and the NP had a conversation with patient and when the word "hospice" was brought up, patient became very tearful and did not want to discuss this matter further. She continues to refuse going to the ED. I was able to convince her to at least allow EMS to come and assess her. I called EMS and the operator requested  that I place a face mask on patient. When EMS arrived, patient's Oxygen level increased to 86-90% on 6.5L. It wasn't until I placed that face mask on patient that her Oxygen levels began to increase. They recommended that she come to the ED, but patient still refused and signed the waiver. She was instructed that if her Oxygen levels drops below 86%, then she needs to call them back, or if her breathing worsens. Patient did calm some, but dyspnea is still present. I set up her nebulizer treatment with Albuterol and she asked for me to sit it beside her and that she would take it once I left. She felt stable enough for me to be able to leave her there. She did have me contact two of her sons, [name] and [name] (both live out of town), who both feel that patient should consider hospice and wanted EMS called to evaluate her. I called [name] to provide update after EMS left as requested and he said that he would call to check in on her over the weekend. Patient wants to think about hospice further over the weekend before making a decision. Will continue to monitor.   HISTORY OF PRESENT ILLNESS:  [name] who resides at home alone. Palliative Care Team continues to follow patient. Will continue to visit weekly and PRN.  CODE STATUS: Full Code ADVANCED DIRECTIVES: N MOST FORM: yes PPS: 50%   PHYSICAL EXAM:   VITALS: Today's Vitals   07/24/18 1542  BP: 116/75  Pulse: Marland Kitchen)  118  Resp: (!) 28  Temp: (!) 97.1 F (36.2 C)  TempSrc: Temporal  SpO2: (!) 74%  PainSc: 0-No pain    LUNGS: decreased breath sounds, expiratory wheezes bilaterally CARDIAC: Cor Tachy EXTREMITIES: Trace edema bilateral feet SKIN: Skin color, texture, turgor normal. No rashes or lesions  NEURO: Alert and oriented x 3, increased generalized weakness, ambulatory with walker   (Duration of visit and documentation 180 minutes)    Daryl Eastern, RN BSN

## 2018-07-27 NOTE — Telephone Encounter (Signed)
Called and spoke with Merry Proud from [facility] PT. Merry Proud is needing a verbal for pt to have PT. Tammy, please advise if you are okay with [place] giving him the verbal for this to be able to be done for pt. Thank you!

## 2018-07-27 NOTE — Telephone Encounter (Signed)
Phone call placed to [facility] to check on previous order for replacement oxygen tanks. Patient had reported that she had not yet received tanks.Spoke with Daphne who said that [employer] is no longer able to authorize home delivery of oxygen tanks. Will follow up with primary palliative team

## 2018-07-27 NOTE — Telephone Encounter (Signed)
Called and spoke with Merry Proud letting him know it was fine for the verbal order for PT per TP. Merry Proud verbalized understanding. Nothing further needed.

## 2018-07-28 ENCOUNTER — Other Ambulatory Visit: Payer: Self-pay

## 2018-07-28 ENCOUNTER — Ambulatory Visit (INDEPENDENT_AMBULATORY_CARE_PROVIDER_SITE_OTHER): Payer: Medicare Other | Admitting: Adult Health

## 2018-07-28 ENCOUNTER — Encounter: Payer: Self-pay | Admitting: Adult Health

## 2018-07-28 DIAGNOSIS — J441 Chronic obstructive pulmonary disease with (acute) exacerbation: Secondary | ICD-10-CM | POA: Diagnosis not present

## 2018-07-28 DIAGNOSIS — J9611 Chronic respiratory failure with hypoxia: Secondary | ICD-10-CM

## 2018-07-28 MED ORDER — DOXYCYCLINE HYCLATE 100 MG PO TABS: 100.0000 mg | ORAL_TABLET | Freq: Two times a day (BID) | ORAL | 0 refills | Status: DC

## 2018-07-28 MED ORDER — PREDNISONE 20 MG PO TABS: 20.0000 mg | ORAL_TABLET | Freq: Every day | ORAL | 3 refills | Status: DC

## 2018-07-28 NOTE — Progress Notes (Signed)
Virtual Visit via Telephone Note  I connected with [name] on 07/28/18 at 10:30 AM EDT by telephone and verified that I am speaking with the correct person using two identifiers.  Location: Patient: Home  Provider: Office    I discussed the limitations, risks, security and privacy concerns of performing an evaluation and management service by telephone and the availability of in person appointments. I also discussed with the patient that there may be a patient responsible charge related to this service. The patient expressed understanding and agreed to proceed.   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 More dyspnea last couple of weeks. Some intermittent wheezing .  Remains on Trelegy daily and Prednisone 20mg  daily.  Palliative care follows at home, recommends she change to home hospice. We discussed the options with Hospice . Patient education given .   1 week ago , woke up very short of breath . Called EMS, found Oxygen concentrator was not working . DME came out and switched out the concentrator . O2 sats are doing okay now.  On 5l/m oxygen .    Observations/Objective: FEV-1 in 2008 was 63% with a diffusion capacity of 33%  PFTs 78/14 shows gold stage 3 copd with fev1 1.12L/49%, FVC 2.01L/70% - post bd parameters. No bd response, Ratio 60/77%. TLC 87%, DLCO 5/21%  Duplex LE - 07/21/12 - negative. BNP 07/16/12 - 25   2 D echo on 03/08/15 showed EF 60-65%, Grade 1 DD , PAP mod increased at 35mmHg.   FEV-1 in 2008 was 63% with a diffusion capacity of 33%  2 D echo on 03/08/15 showed EF 60-65%, Grade 1 DD , PAP mod increased at  42mmHg.   CT chest 03/21/15 showed increased mediastinal/bilateral hilar lymphadenopahty worrisome for lymphoma. Unchanged pulmonary nodules x 2 years.   Subsequent PET scan showed intensely hypermetabolic adenopahty in chest .   Referred to [facility] 03/2015 >too sick for bx.   Walk test in office 05/12/2015 > on 3 L baseline oxygen walking 185 feet 3 laps: She only walk one lap and then she desaturated to 86%. At this point her oxygen was increased to 6 L and then she walk one lap and maintained a pulse ox of 90%. After that she was dyspneic and did not want walk further.  Assessment and Plan: Recurrent COPD exacerbation-requires high flow O2 5l/m  Check sputum cx  Needs cxr on return  Will tx w/ abx and steroid burst   Chronic Oxygen dependent RF -no increased O2 demands.   Plan  Patient Instructions  Sputum culture (please stop by office , pick up culture cup and then return)  Doxycycline 100mg  Twice daily  For 1 week  Prednisone 30mg  daily for 1 week then 20mg  daily , hold at at this dose.  Mucinex DM Twice daily  As needed  Cough/congestion  TRELEGY 1 puff daily .  Albuterol Neb every every 4hr as needed for wheezing /shortness of breath .  Continue on TRELEGY 1 puff daily , rinse after use.  Coninue on Oxygen 5l/m , goal is to have Oxygen level >88%.  Follow up in 3-4  weeks with Dr. [name] or [place]  and As needed  With chest xray .  Please contact office for sooner follow up if symptoms do not improve or worsen or seek emergency care       Follow Up Instructions: Follow up with Dr. [name] in 3-4 weeks and As needed   Please contact office for sooner follow up if symptoms do not improve or worsen or seek emergency care     I discussed the assessment and treatment plan with the patient. The patient was provided an opportunity to ask questions and all were answered. The patient agreed with the plan and demonstrated an understanding of the instructions.   The patient  was advised to call back or seek an in-person evaluation if the symptoms worsen or if the condition fails to improve as anticipated.  I provided 30  minutes of non-face-to-face time during this encounter.   [name], NP

## 2018-07-28 NOTE — Patient Instructions (Addendum)
Sputum culture (please stop by office , pick up culture cup and then return)  Doxycycline 100mg  Twice daily  For 1 week  Prednisone 30mg  daily for 1 week then 20mg  daily , hold at at this dose.  Mucinex DM Twice daily  As needed  Cough/congestion  TRELEGY 1 puff daily .  Albuterol Neb every every 4hr as needed for wheezing /shortness of breath .  Continue on TRELEGY 1 puff daily , rinse after use.  Coninue on Oxygen 5l/m , goal is to have Oxygen level >88%.  Follow up in 3-4  weeks with Dr. [name] or Jaysten Essner and As needed  With chest xray .  Please contact office for sooner follow up if symptoms do not improve or worsen or seek emergency care

## 2018-07-29 ENCOUNTER — Telehealth: Payer: Self-pay | Admitting: Adult Health

## 2018-07-29 ENCOUNTER — Other Ambulatory Visit: Payer: Self-pay | Admitting: *Deleted

## 2018-07-29 ENCOUNTER — Other Ambulatory Visit: Payer: Medicare Other | Admitting: *Deleted

## 2018-07-29 ENCOUNTER — Other Ambulatory Visit: Payer: Self-pay

## 2018-07-29 DIAGNOSIS — J9610 Chronic respiratory failure, unspecified whether with hypoxia or hypercapnia: Secondary | ICD-10-CM | POA: Diagnosis not present

## 2018-07-29 DIAGNOSIS — J441 Chronic obstructive pulmonary disease with (acute) exacerbation: Secondary | ICD-10-CM | POA: Diagnosis not present

## 2018-07-29 DIAGNOSIS — I110 Hypertensive heart disease with heart failure: Secondary | ICD-10-CM | POA: Diagnosis not present

## 2018-07-29 DIAGNOSIS — I5032 Chronic diastolic (congestive) heart failure: Secondary | ICD-10-CM | POA: Diagnosis not present

## 2018-07-29 DIAGNOSIS — Z515 Encounter for palliative care: Secondary | ICD-10-CM

## 2018-07-29 DIAGNOSIS — F17210 Nicotine dependence, cigarettes, uncomplicated: Secondary | ICD-10-CM | POA: Diagnosis not present

## 2018-07-29 DIAGNOSIS — E119 Type 2 diabetes mellitus without complications: Secondary | ICD-10-CM | POA: Diagnosis not present

## 2018-07-29 MED ORDER — DOXYCYCLINE HYCLATE 100 MG PO TABS: 100.0000 mg | ORAL_TABLET | Freq: Two times a day (BID) | ORAL | 0 refills | Status: DC

## 2018-07-29 MED ORDER — NYSTATIN 100000 UNIT/ML MT SUSP: 5.0000 mL | Freq: Four times a day (QID) | OROMUCOSAL | 0 refills | Status: DC

## 2018-07-29 MED ORDER — PREDNISONE 20 MG PO TABS: 20.0000 mg | ORAL_TABLET | Freq: Every day | ORAL | 3 refills | Status: DC

## 2018-07-29 NOTE — Patient Outreach (Signed)
[facility]) Care Management  07/29/2018  [name] 1945/06/24 [id number]   RN Health CoachQuarterlyOutreach  Referral Date: 11/03/2017 Referral Source: MD Referral Reason for Referral: Housing Resources & Disease Management Education Insurance:Medicare   Outreach Attempt:  Outreach attempt #1 to patient for followup. No answer. RN Health Coach left HIPAA compliant voicemail message along with contact information.  Plan:  RN Health Coach will make another outreach attempt within the month of July.  [place] [phone] [email or web address]

## 2018-07-29 NOTE — Telephone Encounter (Signed)
Spoke with pt. She states that the antibiotic and prednisone from her televisit with Tammy yesterday didn't get sent to the pharmacy. Upon further review of the pt's chart, Tammy did in fact send these prescriptions in. I have resent them to the pt's preferred pharmacy. While on the phone, pt requested to have Nystatin refilled.  TP - please advise. Thanks.

## 2018-07-29 NOTE — Telephone Encounter (Signed)
Spoke with pt. She is aware that Nystatin has been sent in. Nothing further was needed.

## 2018-07-29 NOTE — Telephone Encounter (Signed)
Yes ok to refill nystatin per TP.

## 2018-07-31 ENCOUNTER — Other Ambulatory Visit: Payer: Medicare Other | Admitting: *Deleted

## 2018-07-31 DIAGNOSIS — I110 Hypertensive heart disease with heart failure: Secondary | ICD-10-CM | POA: Diagnosis not present

## 2018-07-31 DIAGNOSIS — Z515 Encounter for palliative care: Secondary | ICD-10-CM

## 2018-07-31 DIAGNOSIS — I5032 Chronic diastolic (congestive) heart failure: Secondary | ICD-10-CM | POA: Diagnosis not present

## 2018-07-31 DIAGNOSIS — F17210 Nicotine dependence, cigarettes, uncomplicated: Secondary | ICD-10-CM | POA: Diagnosis not present

## 2018-07-31 DIAGNOSIS — J9610 Chronic respiratory failure, unspecified whether with hypoxia or hypercapnia: Secondary | ICD-10-CM | POA: Diagnosis not present

## 2018-07-31 DIAGNOSIS — E119 Type 2 diabetes mellitus without complications: Secondary | ICD-10-CM | POA: Diagnosis not present

## 2018-07-31 DIAGNOSIS — J441 Chronic obstructive pulmonary disease with (acute) exacerbation: Secondary | ICD-10-CM | POA: Diagnosis not present

## 2018-08-02 ENCOUNTER — Other Ambulatory Visit: Payer: Self-pay | Admitting: Internal Medicine

## 2018-08-03 ENCOUNTER — Telehealth: Payer: Self-pay | Admitting: *Deleted

## 2018-08-03 ENCOUNTER — Other Ambulatory Visit: Payer: Self-pay

## 2018-08-03 NOTE — Telephone Encounter (Signed)
Contacted and spoke with patient to arrange a visit this week to obtain sputum specimen as requested. Visit scheduled for tomorrow 08/04/18. Will pick up specimen cup from [facility] Pulmonology office prior to visit.

## 2018-08-03 NOTE — Progress Notes (Signed)
COMMUNITY PALLIATIVE CARE RN NOTE  PATIENT NAME: [name] DOB: 1945-01-29 MRN: [id number]  PRIMARY CARE PROVIDER: Binnie Rail, MD  RESPONSIBLE PARTY:  Acct ID - Guarantor Home Phone Work Phone Relationship Acct Type  [id number] [facility] [phone]  Self P/F     [place], [place], [place] [address]   Covid-19 Pre-screening Negative  PLAN OF CARE and INTERVENTION:  1. ADVANCE CARE PLANNING/GOALS OF CARE: Goal is to remain in her home and avoid hospitalizations. She is a Full Code. 2. PATIENT/CAREGIVER EDUCATION: Symptom management, COPD education, Hospice education 3. DISEASE STATUS: Met with patient in her home. She is initially lying down on the couch just waking up from a nap. She continues with dyspnea with any exertion. She does however, report feeling slightly better. Her Pulmonology office ordered Doxycyline 100 mg po BID x 7 days and Prednisone was increased to 30 mg x 1 week, then is to decrease back down to her maintenance dose of 20 mg. She continues on Trelegy inhaler and Albuterol nebs. Decreased breath sounds, but no wheezing noted today. She says she is currently on Oxygen at 7L/min via [place]. Her sats today are ranging between 84-90%. Also, patient only has one full Oxygen back up E tank left in the home and 5 empty E tanks. Her current DME supplier no longer delivers oxygen tanks to the home. A family member must switch out tanks at their main office. Patient has no-one else who could do this for her, so I went to the DMEs main office and got them switched out for patient today. Education provided regarding hospice, per patient request again today. Evidence provided regarding disease progression. Patient continues to smoke. I feel that patient is hospice eligible, however she still does not want to transition to hospice services as of yet. She wants to continue with Physical Therapy to see if she will make any progress, but seems that she is becoming more open to  hospice than in previous visits. Will continue to monitor.    HISTORY OF PRESENT ILLNESS:  [name] who resides at home alone. Palliative Care Team continues to follow patient. Will continue to visit patient weekly and PRN.  CODE STATUS: Full Code  ADVANCED DIRECTIVES: N MOST FORM: yes PPS: 40%   PHYSICAL EXAM:   VITALS: Today's Vitals   07/29/18 1236  BP: 120/67  Pulse: 100  Resp: 20  Temp: (!) 96.9 F (36.1 C)  TempSrc: Temporal  SpO2: (!) 87%  PainSc: 0-No pain    LUNGS: decreased breath sounds CARDIAC: Cor RRR EXTREMITIES: No edema  SKIN: Exposed skin is dry and intact  NEURO: Alert and oriented x 3, generalized weakness, ambulatory with walker   (Duration of visit and documentation 120 minutes)   Daryl Eastern, RN BSN

## 2018-08-03 NOTE — Progress Notes (Signed)
COMMUNITY PALLIATIVE CARE RN NOTE  PATIENT NAME: [name] DOB: April 14, 1945 MRN: [id number]  PRIMARY CARE PROVIDER: Binnie Rail, MD  RESPONSIBLE PARTY:  Acct ID - Guarantor Home Phone Work Phone Relationship Acct Type  [id number] [facility] [phone]  Self P/F     [address] [place], [place], [place] [address]   Due to the COVID-19 crisis, this virtual check-in visit was done via telephone from my office and it was initiated and consent by this patient and or family.   PLAN OF CARE and INTERVENTION:  1. ADVANCE CARE PLANNING/GOALS OF CARE: Goal is to remain in her home and avoid hospitalizations. She is a Full Code. 2. PATIENT/CAREGIVER EDUCATION: Safe Mobility/Transfers, Energy Conservation 3. DISEASE STATUS: Virtual check-in visit completed via telephone. Patient says that she is feeling better overall, with breathing slightly improved. Dyspnea remains with any exertion and at times during conversation. She is O2 dependent. Currently being treated with Doxycycline 100 mg po twice daily x 7 days, Prednisone 30 mg daily x 1 week (then reduce to 20 mg and remain at this dose) and Mucinex twice daily for suspected respiratory infection. She continues with her Trelegy inhaler daily and is utilizing her Albuterol nebs more regularly. Xanax also remains effective for her anxiety. She advised that [name], NP is requesting a sputum culture from her. Patient has requested that I go by the office to pick up a specimen cup since she has no one else that can do this for her. She says that it could be obtained any time next week. Will visit patient next week to perform this task for her. She did have a Physical Therapy session today and says that it went well. Will continue to monitor.   HISTORY OF PRESENT ILLNESS: [name] who resides at home alone. She does have a hired caregiver that visits 2 days/week for a few hours. Palliative Care Team continues to follow patient. Next visit  will be in 1 week.     CODE STATUS: Full Code ADVANCED DIRECTIVES: N MOST FORM: no PPS: 40%   (Duration of visit and documentation 30 minutes)   Daryl Eastern, RN BSN

## 2018-08-04 ENCOUNTER — Other Ambulatory Visit: Payer: Medicare Other | Admitting: *Deleted

## 2018-08-04 ENCOUNTER — Other Ambulatory Visit: Payer: Self-pay

## 2018-08-04 DIAGNOSIS — Z515 Encounter for palliative care: Secondary | ICD-10-CM

## 2018-08-04 NOTE — Progress Notes (Signed)
COMMUNITY PALLIATIVE CARE RN NOTE  PATIENT NAME: [name] DOB: [birth year] MRN: [id number]  PRIMARY CARE PROVIDER: Binnie Rail, MD  RESPONSIBLE PARTY:  Acct ID - Guarantor Home Phone Work Phone Relationship Acct Type  [id number] [facility] [phone]  Self P/F     [place], [place], [place] [address]   Covid-19 Pre-screening Negative  PLAN OF CARE and INTERVENTION:  1. ADVANCE CARE PLANNING/GOALS OF CARE: Goal is for patient to remain in her home and avoid hospitalizations. 2. PATIENT/CAREGIVER EDUCATION: Safe Mobility and Respiratory symptom management 3. DISEASE STATUS: Met with patient in her home. She is initially lying down on her couch. Upon sitting up, mild dyspnea noted but was able to recover within 30 seconds. No dyspnea noted with conversation today. She has been keeping her Oxygen at 7L/min via [place]. Sats today 89% at rest. She reports working with the Physical Therapist on Friday. They worked on chair exercises and sitting and standing. Patient's Oxygen level during this time dropped into the upper 70s so PT ended the session at this point. She has more PT sessions tomorrow and Friday this week. Prior to going on this visit, I went by [facility] Pulmonology to pick up a specimen cup for a sputum culture. Spoke with Janett Billow, nurse who provided me with two specimen cups and said that once patient completes giving a specimen, that it needs to be returned within 4 hours and patient is not to refrigerate it. I did explain these instructions to patient. She was unable to expectorate any phlegm today. Recommended that she try in the morning time to give the specimen and then call me so I can pick it up. She will try to have this done this week. She continues on Doxycyline 100 mg po BID and Prednisone 30 mg. She says she has a few days left on both medications then will stop the antibiotic and decrease Prednisone to 20 mg and is to remain at this dose. Will continue to  monitor.  HISTORY OF PRESENT ILLNESS:  [name] who resides at home alone. Palliative Care Team continues to follow patient. Will continue to visit weekly and PRN.  CODE STATUS: Full Code   ADVANCED DIRECTIVES: N MOST FORM: yes PPS: 40%   PHYSICAL EXAM:   VITALS: Today's Vitals   08/04/18 1322  BP: 110/63  Pulse: (!) 106  Resp: 18  Temp: (!) 97.1 F (36.2 C)  TempSrc: Temporal  SpO2: (!) 89%  PainSc: 0-No pain    LUNGS: decreased breath sounds CARDIAC: Cor Tachy EXTREMITIES: No edema SKIN: Skin color, texture, turgor normal. No rashes or lesions  NEURO: Alert and oriented x 3, pleasant mood, generalized weakness, ambulatory with walker   (Duration of visit and documentation 75 minutes)    Daryl Eastern, RN BSN

## 2018-08-05 DIAGNOSIS — J9610 Chronic respiratory failure, unspecified whether with hypoxia or hypercapnia: Secondary | ICD-10-CM | POA: Diagnosis not present

## 2018-08-05 DIAGNOSIS — J441 Chronic obstructive pulmonary disease with (acute) exacerbation: Secondary | ICD-10-CM | POA: Diagnosis not present

## 2018-08-05 DIAGNOSIS — I110 Hypertensive heart disease with heart failure: Secondary | ICD-10-CM | POA: Diagnosis not present

## 2018-08-05 DIAGNOSIS — E119 Type 2 diabetes mellitus without complications: Secondary | ICD-10-CM | POA: Diagnosis not present

## 2018-08-05 DIAGNOSIS — I5032 Chronic diastolic (congestive) heart failure: Secondary | ICD-10-CM | POA: Diagnosis not present

## 2018-08-05 DIAGNOSIS — F17210 Nicotine dependence, cigarettes, uncomplicated: Secondary | ICD-10-CM | POA: Diagnosis not present

## 2018-08-07 ENCOUNTER — Other Ambulatory Visit: Payer: Self-pay | Admitting: Internal Medicine

## 2018-08-07 DIAGNOSIS — J441 Chronic obstructive pulmonary disease with (acute) exacerbation: Secondary | ICD-10-CM | POA: Diagnosis not present

## 2018-08-07 DIAGNOSIS — I5032 Chronic diastolic (congestive) heart failure: Secondary | ICD-10-CM | POA: Diagnosis not present

## 2018-08-07 DIAGNOSIS — J9610 Chronic respiratory failure, unspecified whether with hypoxia or hypercapnia: Secondary | ICD-10-CM | POA: Diagnosis not present

## 2018-08-07 DIAGNOSIS — F17210 Nicotine dependence, cigarettes, uncomplicated: Secondary | ICD-10-CM | POA: Diagnosis not present

## 2018-08-07 DIAGNOSIS — E119 Type 2 diabetes mellitus without complications: Secondary | ICD-10-CM | POA: Diagnosis not present

## 2018-08-07 DIAGNOSIS — I110 Hypertensive heart disease with heart failure: Secondary | ICD-10-CM | POA: Diagnosis not present

## 2018-08-10 ENCOUNTER — Other Ambulatory Visit: Payer: Medicare Other | Admitting: *Deleted

## 2018-08-10 ENCOUNTER — Other Ambulatory Visit: Payer: Self-pay

## 2018-08-10 DIAGNOSIS — Z515 Encounter for palliative care: Secondary | ICD-10-CM

## 2018-08-10 NOTE — Progress Notes (Signed)
COMMUNITY PALLIATIVE CARE RN NOTE  PATIENT NAME: [name] DOB: Oct 29, 1945 MRN: [id number]  PRIMARY CARE PROVIDER: Binnie Rail, MD  RESPONSIBLE PARTY:  Acct ID - Guarantor Home Phone Work Phone Relationship Acct Type  [id number] [facility] [phone]  Self P/F     [address] [place], [place], [place] [address]   Due to the COVID-19 crisis, this virtual check-in visit was done via telephone from my office and it was initiated and consent by this patient and or family.  PLAN OF CARE and INTERVENTION:  1. ADVANCE CARE PLANNING/GOALS OF CARE: Goal is for patient to remain in her home and avoid hospitalizations. 2. PATIENT/CAREGIVER EDUCATION: Respiratory Symptom Management 3. DISEASE STATUS: Virtual check-in visit completed via telephone. Patient denies pain. She reports that over the weekend, she was experiencing increased sinus congestion on Saturday and yesterday felt tired all day. Today she is feeling better. She has been unable to expectorate any phlegm for a sputum culture requested by her Pulmonology office. She has tried expectorants for the past few days but feels that her issue is more sinus/allergy related at this present time. She recently completed a course of antibiotics and Prednisone taper last week. She is now back to her Prednisone maintenance dose of 20 mg. She continues on Oxygen at 7L/min via [place]. She received two sessions last week of Physical Therapy. She worked on chair exercises and sitting and standing. PT wants her to work on this daily if possible. Her oxygen levels do drop into the upper 70s, lower 80s with exercise. She feels that it is helping overall. Will continue to monitor.   HISTORY OF PRESENT ILLNESS: [name] who resides at home alone. Palliative Care Team continues to follow patient. Next visit scheduled on 08/13/18.   CODE STATUS: Full Code  ADVANCED DIRECTIVES: N MOST FORM: yes PPS: 40%   (Duration of visit and documentation 30  minutes)   Daryl Eastern, RN BSN

## 2018-08-11 ENCOUNTER — Other Ambulatory Visit: Payer: Self-pay | Admitting: *Deleted

## 2018-08-11 ENCOUNTER — Encounter: Payer: Self-pay | Admitting: *Deleted

## 2018-08-11 NOTE — Patient Outreach (Signed)
[facility]) Care Management  [facility] Care Manager  08/11/2018   [name] 1945-09-10 [id number]   [place] Quarterly Outreach   Referral Date:  11/03/2017 Referral Source:  MD Referral Reason for Referral:  Housing Resources & Disease Management Education Insurance:  Medicare   Outreach Attempt:  Successful telephone outreach to patient for follow up.  HIPAA verified with patient.  Patient reporting she just completed another round of antibiotics and increase in her steroids due to frequent COPD exacerbations.  States she feels some better after the medications and now is back down to her normal dose of daily steroids.  Still reporting shortness of breath with exertion that progressively has gotten worse.  Continues to wear home oxygen at 7 l/m.  Reporting she has had to call EMS about 3 times in the last 6 weeks to assist with lowering oxygen levels; states that on several occasions EMS wanted to transport to hospital, but patient refused.  Patient reporting not using her rescue inhaler in the past few weeks but has been using her nebulizer twice a day and compliance with her Trelegy daily.  Fasting blood sugar this morning was 126 with fating ranges of 120-200's.  Denies any recent falls and continues to work with home health therapy.  Continues to work with [place] who has been assisting with oxygen tank fills.  Encounter Medications:  Outpatient Encounter Medications as of 08/11/2018  Medication Sig Note  . albuterol (PROVENTIL HFA;VENTOLIN HFA) 108 (90 Base) MCG/ACT inhaler Inhale 2 puffs into the lungs every 6 (six) hours as needed for wheezing or shortness of breath.   Marland Kitchen albuterol (PROVENTIL) (2.5 MG/3ML) 0.083% nebulizer solution Take 2.5 mg by nebulization every 6 (six) hours as needed for wheezing or shortness of breath.   . ALPRAZolam (XANAX) 0.5 MG tablet TAKE 1 TABLET BY MOUTH THREE TIMES A DAY AS NEEDED FOR ANXIETY   . fluticasone (FLONASE) 50  MCG/ACT nasal spray USE 2 SPRAYS IN EACH NOSTRIL DAILY AS NEEDED   . Fluticasone-Umeclidin-Vilant (TRELEGY ELLIPTA) 100-62.5-25 MCG/INH AEPB Inhale 1 puff into the lungs daily.   . furosemide (LASIX) 40 MG tablet Take 40 mg by mouth daily.    Marland Kitchen GLUCOCOM LANCETS 33G MISC Use with Test Strips to take blood sugars   . glucose blood (CONTOUR NEXT TEST) test strip USE TO TAKE BLOOD SUGARS TWICE DAILY.   Marland Kitchen Insulin Glargine (LANTUS SOLOSTAR) 100 UNIT/ML Solostar Pen INJECT 32 UNITS INTO THE SKIN DAILY AT 10 PM. (Patient taking differently: INJECT 22 UNITS INTO THE SKIN DAILY AT 10 PM.) 03/16/2018: Decreased to 22 units nightly  . Insulin Pen Needle (BD PEN NEEDLE NANO U/F) 32G X 4 MM MISC USE TO ADMINISTER LANTUS INSULIN   . nystatin (MYCOSTATIN) 100000 UNIT/ML suspension Take 5 mLs (500,000 Units total) by mouth 4 (four) times daily. 08/11/2018: Takes as needed  . pantoprazole (PROTONIX) 40 MG tablet Take 1 tablet (40 mg total) by mouth daily.   Marland Kitchen PARoxetine (PAXIL) 40 MG tablet TAKE 1 TABLET BY MOUTH EVERY DAY IN THE MORNING. Need office visit for more refills.   . predniSONE (DELTASONE) 20 MG tablet Take 1 tablet (20 mg total) by mouth daily with breakfast.   . triamcinolone cream (KENALOG) 0.1 % Apply 1 application topically 2 (two) times daily.   Marland Kitchen ALPRAZolam (XANAX) 1 MG tablet TAKE 1/2 TABLET BY MOUTH THREE TIMES A DAY AS NEEDED FOR ANXIETY (Patient not taking: Reported on 06/26/2018)   . doxycycline (VIBRA-TABS) 100  MG tablet Take 1 tablet (100 mg total) by mouth 2 (two) times daily. (Patient not taking: Reported on 08/11/2018) 08/11/2018: Completed course of antibiotics  . predniSONE (DELTASONE) 10 MG tablet 4 tabs daily for 3 days and 3 tabs daily for 3 days then 2 tabs daily . (Patient not taking: Reported on 06/26/2018)    No facility-administered encounter medications on file as of 08/11/2018.     Functional Status:  In your present state of health, do you have any difficulty performing the  following activities: 03/26/2018 11/27/2017  Hearing? N N  Vision? Y N  Comment Recently cancelled appointment with eye doctor. -  Difficulty concentrating or making decisions? N N  Walking or climbing stairs? N Y  Dressing or bathing? N Y  Doing errands, shopping? N Y  Conservation officer, nature and eating ? N Y  Using the Toilet? N N  In the past six months, have you accidently leaked urine? N Y  Do you have problems with loss of bowel control? N N  Managing your Medications? N N  Managing your Finances? N N  Housekeeping or managing your Housekeeping? N Y  Some recent data might be hidden    Fall/Depression Screening: Fall Risk  08/11/2018 05/07/2018 03/26/2018  Falls in the past year? 1 1 1   Comment last fall October 2019 Last Fall October 2019 -  Number falls in past yr: 1 1 1   Injury with Fall? 0 0 0  Risk for fall due to : History of fall(s);Medication side effect;Impaired balance/gait;Impaired mobility;Impaired vision History of fall(s);Impaired vision;Medication side effect;Impaired balance/gait;Impaired mobility History of fall(s);Impaired balance/gait;Impaired mobility;Impaired vision;Medication side effect  Follow up Falls evaluation completed;Education provided;Falls prevention discussed Falls evaluation completed;Falls prevention discussed;Education provided Education provided;Falls prevention discussed   PHQ 2/9 Scores 03/26/2018 11/27/2017 11/03/2017 06/24/2016 01/27/2015 07/12/2013 09/14/2012  PHQ - 2 Score 2 0 2 0 2 2 1   PHQ- 9 Score 13 - 12 - 5 13 -   [name] CM Care Plan Problem One     Most Recent Value  Care Plan Problem One  Knowledge deficiet related to self care of COPD and CHF.  Role Documenting the Problem One  [place] for Problem One  Active  [facility] Long Term Goal   Patient will have no hospitalizations or emergency room visits in the next 90 days.  [name] Long Term Goal Start Date  08/11/18  Interventions for Problem One Long Term Goal  Reviewed and discussed current care  plan and goals, encouraged to keep and attend scheduled medical appointments (even Telehealth appointments), encouraged to stay in the home unless absolutely necessary to go outside the home (and to wear mask while out), reviewed medications and encouraged medication compliance, fall precautions and preventions reviewed, encourged patient to use walker with ambulation, discussed how to collect sputum sample, discussed energy conservation, encouraged continued use of home oxygen     Appointments:  Patient last seen primary care provider, Dr. Quay Burow on 05/26/2018 and has scheduled follow up appointment on 08/24/2018.  Has follow up with Pulmonology on 08/25/2018.  Plan: RN Health Coach will send primary care provider quarterly update. RN Health Coach will make next telephone outreach to patient the month of September.  [place] [phone] [email or web address]

## 2018-08-12 DIAGNOSIS — J441 Chronic obstructive pulmonary disease with (acute) exacerbation: Secondary | ICD-10-CM | POA: Diagnosis not present

## 2018-08-12 DIAGNOSIS — F17210 Nicotine dependence, cigarettes, uncomplicated: Secondary | ICD-10-CM | POA: Diagnosis not present

## 2018-08-12 DIAGNOSIS — I110 Hypertensive heart disease with heart failure: Secondary | ICD-10-CM | POA: Diagnosis not present

## 2018-08-12 DIAGNOSIS — J9610 Chronic respiratory failure, unspecified whether with hypoxia or hypercapnia: Secondary | ICD-10-CM | POA: Diagnosis not present

## 2018-08-12 DIAGNOSIS — E119 Type 2 diabetes mellitus without complications: Secondary | ICD-10-CM | POA: Diagnosis not present

## 2018-08-12 DIAGNOSIS — I5032 Chronic diastolic (congestive) heart failure: Secondary | ICD-10-CM | POA: Diagnosis not present

## 2018-08-13 ENCOUNTER — Other Ambulatory Visit: Payer: Self-pay

## 2018-08-13 ENCOUNTER — Other Ambulatory Visit: Payer: Medicare Other | Admitting: *Deleted

## 2018-08-13 ENCOUNTER — Other Ambulatory Visit: Payer: Medicare Other

## 2018-08-13 DIAGNOSIS — I5032 Chronic diastolic (congestive) heart failure: Secondary | ICD-10-CM | POA: Diagnosis not present

## 2018-08-13 DIAGNOSIS — E119 Type 2 diabetes mellitus without complications: Secondary | ICD-10-CM | POA: Diagnosis not present

## 2018-08-13 DIAGNOSIS — J441 Chronic obstructive pulmonary disease with (acute) exacerbation: Secondary | ICD-10-CM

## 2018-08-13 DIAGNOSIS — I110 Hypertensive heart disease with heart failure: Secondary | ICD-10-CM | POA: Diagnosis not present

## 2018-08-13 DIAGNOSIS — Z515 Encounter for palliative care: Secondary | ICD-10-CM

## 2018-08-13 DIAGNOSIS — J9610 Chronic respiratory failure, unspecified whether with hypoxia or hypercapnia: Secondary | ICD-10-CM | POA: Diagnosis not present

## 2018-08-13 DIAGNOSIS — F17210 Nicotine dependence, cigarettes, uncomplicated: Secondary | ICD-10-CM | POA: Diagnosis not present

## 2018-08-14 NOTE — Progress Notes (Signed)
COMMUNITY PALLIATIVE CARE RN NOTE  PATIENT NAME: [name] DOB: 1945/06/02 MRN: [id number]  PRIMARY CARE PROVIDER: Binnie Rail, MD  RESPONSIBLE PARTY:  Acct ID - Guarantor Home Phone Work Phone Relationship Acct Type  [id number] [facility] [phone]  Self P/F     [place], [place], [place] [address]   Covid-19 Pre-screening Negative  PLAN OF CARE and INTERVENTION:  1. ADVANCE CARE PLANNING/GOALS OF CARE: Goal is for patient to remain in her home and avoid hospitalizations. She is a Full Code. 2. PATIENT/CAREGIVER EDUCATION: Breathing Techniques, Dyspnea Management 3. DISEASE STATUS: Met with patient in her home. She is initially lying down on the couch. She is awake and alert, but reports feeling more tired lately. She denies pain. She is experiencing some mild dyspnea during conversation, requiring a breath after every 4-5 words. Her oxygen levels ranging from 70-86% on 7L/min via [place] throughout visit. Physical Therapist, Princess, arrived during my visit. She was unable to have patient do any exercises d/t low oxygen levels. She was able to work on coughing and deep breathing during visit. She also had patient utilize her flutter valve. She did not want EMS called and did not want to go to the ED for evaluation. Reinforced that she needs to utilize her nebulizers at least 3 times a day. She continues on Xanax PRN to help with anxiety and ease breathing. Tammy Parrett, NP ordered a sputum culture over a week ago. Patient was able to give a sample today. I took sputum specimen to [facility] Pulmonology today. Also returned an oxygen tank to their office as requested. She had to use one of their tanks in the past and was unable to return it herself. Offered to visit with patient again tomorrow. She says that she will call me tomorrow if she feels a visit is needed. Will continue to monitor.   HISTORY OF PRESENT ILLNESS:  [name] who resides at home alone. She has a  caregiver that visits 2x/week. Palliative Care team continues to follow patient. Will continue to visit patient weekly and PRN for now.   CODE STATUS: Full Code ADVANCED DIRECTIVES: N MOST FORM: yes PPS: 40%   PHYSICAL EXAM:   VITALS: Today's Vitals   08/13/18 1345  BP: 103/66  Pulse: (!) 114  Resp: (!) 24  Temp: (!) 96.7 F (35.9 C)  TempSrc: Temporal  SpO2: (!) 84%  PainSc: 0-No pain    LUNGS: decreased breath sounds CARDIAC: Cor Tachy EXTREMITIES: No edema SKIN: Skin color, texture, turgor normal. No rashes or lesions  NEURO: Alert and oriented x 3, pleasant mood, generalized weakness, ambulatory with walker   (Duration of visit and documentation 75 minutes)   Daryl Eastern, RN BSN

## 2018-08-16 LAB — RESPIRATORY CULTURE OR RESPIRATORY AND SPUTUM CULTURE
MICRO NUMBER:: 699173
RESULT:: NORMAL
SPECIMEN QUALITY:: ADEQUATE

## 2018-08-17 ENCOUNTER — Other Ambulatory Visit: Payer: Self-pay | Admitting: Internal Medicine

## 2018-08-17 DIAGNOSIS — E119 Type 2 diabetes mellitus without complications: Secondary | ICD-10-CM | POA: Diagnosis not present

## 2018-08-17 DIAGNOSIS — Z9981 Dependence on supplemental oxygen: Secondary | ICD-10-CM | POA: Diagnosis not present

## 2018-08-17 DIAGNOSIS — Z7952 Long term (current) use of systemic steroids: Secondary | ICD-10-CM | POA: Diagnosis not present

## 2018-08-17 DIAGNOSIS — I5032 Chronic diastolic (congestive) heart failure: Secondary | ICD-10-CM | POA: Diagnosis not present

## 2018-08-17 DIAGNOSIS — J9610 Chronic respiratory failure, unspecified whether with hypoxia or hypercapnia: Secondary | ICD-10-CM | POA: Diagnosis not present

## 2018-08-17 DIAGNOSIS — I110 Hypertensive heart disease with heart failure: Secondary | ICD-10-CM | POA: Diagnosis not present

## 2018-08-17 DIAGNOSIS — F17210 Nicotine dependence, cigarettes, uncomplicated: Secondary | ICD-10-CM | POA: Diagnosis not present

## 2018-08-17 DIAGNOSIS — J441 Chronic obstructive pulmonary disease with (acute) exacerbation: Secondary | ICD-10-CM | POA: Diagnosis not present

## 2018-08-17 DIAGNOSIS — Z794 Long term (current) use of insulin: Secondary | ICD-10-CM | POA: Diagnosis not present

## 2018-08-18 ENCOUNTER — Other Ambulatory Visit: Payer: Self-pay

## 2018-08-18 ENCOUNTER — Other Ambulatory Visit: Payer: Medicare Other | Admitting: *Deleted

## 2018-08-18 DIAGNOSIS — Z515 Encounter for palliative care: Secondary | ICD-10-CM | POA: Diagnosis not present

## 2018-08-19 DIAGNOSIS — J9610 Chronic respiratory failure, unspecified whether with hypoxia or hypercapnia: Secondary | ICD-10-CM | POA: Diagnosis not present

## 2018-08-19 DIAGNOSIS — F17210 Nicotine dependence, cigarettes, uncomplicated: Secondary | ICD-10-CM | POA: Diagnosis not present

## 2018-08-19 DIAGNOSIS — I5032 Chronic diastolic (congestive) heart failure: Secondary | ICD-10-CM | POA: Diagnosis not present

## 2018-08-19 DIAGNOSIS — J441 Chronic obstructive pulmonary disease with (acute) exacerbation: Secondary | ICD-10-CM | POA: Diagnosis not present

## 2018-08-19 DIAGNOSIS — I110 Hypertensive heart disease with heart failure: Secondary | ICD-10-CM | POA: Diagnosis not present

## 2018-08-19 DIAGNOSIS — E119 Type 2 diabetes mellitus without complications: Secondary | ICD-10-CM | POA: Diagnosis not present

## 2018-08-19 NOTE — Progress Notes (Signed)
COMMUNITY PALLIATIVE CARE RN NOTE  PATIENT NAME: [name] DOB: 11/16/1945 MRN: [id number]  PRIMARY CARE PROVIDER: Binnie Rail, MD  RESPONSIBLE PARTY:  Acct ID - Guarantor Home Phone Work Phone Relationship Acct Type  [id number] [employer] [phone]  Self P/F     [address] [place], [place], [place] [address]   Due to the COVID-19 crisis, this virtual check-in visit was done via telephone from my office and it was initiated and consent by this patient and or family.  PLAN OF CARE and INTERVENTION:  1. ADVANCE CARE PLANNING/GOALS OF CARE: Goal is for her to remain in her home and avoid hospitalizations. She is a Full Code. 2. PATIENT/CAREGIVER EDUCATION: Respiratory symptom management 3. DISEASE STATUS: Virtual check-in visit completed via telephone. Patient remains alert and oriented, and able to engage in appropriate conversation. She denies pain. She says that her Oxygen levels have been staying between 90-93% on 7L/min via [place]. Recent sputum culture results were negative. She is taking her nebulizer treatments twice daily in the am and pm. She continues with PRN Xanax for anxiety. She does experience dyspnea with exertion and at times during conversation, but recovers within 30-60 seconds most of the time. She continues with a home health aide twice weekly to assist with household chores, errands and personal care. She has PT twice weekly through [facility]. She has no questions or concerns at this time. Will continue to monitor.  HISTORY OF PRESENT ILLNESS:  [name] who resides at home alone. Palliative Care team continues to follow patient. Will continue to check in or visit patient weekly and PRN.  CODE STATUS: Full Code ADVANCED DIRECTIVES: N MOST FORM: yes PPS: 40%   (Duration of visit and documentation 45 minutes)   Daryl Eastern, RN BSN

## 2018-08-19 NOTE — Progress Notes (Signed)
Virtual Visit via Telephone Note  I connected with [name] on 08/20/18 at 10:00 AM EDT by telephone and verified that I am speaking with the correct person using two identifiers.   I discussed the limitations of evaluation and management by telemedicine and the availability of in person appointments. The patient expressed understanding and agreed to proceed.  The patient is currently at home and I am in the office.    No referring provider.    History of Present Illness: She is here for follow up of her chronic medical conditions.   COPD, chronic resp failure on oxygen:  She is still smoking.  She follows with pulmonary.  This morning her oxygen was 92% when she first woke up.  She went to the bathroom and came back and her oxygen was 87% and it took 3-4 minutes to go back up.  She states her oxygen was increased to 7 L/min and she was unsure if she needs to stay at that level or decrease it back down.  She is still smoking some.  Diabetes: She is taking her medication daily as prescribed. She is compliant with a diabetic diet.  She monitors her sugars and they have been running 132. Rare sugars over 200 - if on more prednisone.  Fatigue: She does feel more fatigued.  HFpEF, Hypertension: She is taking her medication daily. She is compliant with a low sodium diet.  She denies chest pain, edema and regular headaches. She does not monitor her blood pressure at home.    Anxiety: She is taking her medication daily as prescribed. She denies any side effects from the medication. She feels her anxiety is well controlled and she is happy with her current dose of medication.   GERD:  She is taking her medication daily as prescribed.  She denies any GERD symptoms and feels her GERD is well controlled.   Review of Systems  Constitutional: Negative for chills and fever.  Respiratory: Positive for cough (mild), shortness of breath and wheezing (mild).   Cardiovascular: Positive for palpitations  (when SOB). Negative for chest pain and leg swelling.  Gastrointestinal: Negative for heartburn.  Neurological: Negative for tingling and headaches.     Social History   Socioeconomic History  . Marital status: Divorced    Spouse name: Not on file  . Number of children: Not on file  . Years of education: Not on file  . Highest education level: Not on file  Occupational History  . Occupation: works at a call center  Social Needs  . Financial resource strain: Not very hard  . Food insecurity    Worry: Never true    Inability: Never true  . Transportation needs    Medical: Yes    Non-medical: No  Tobacco Use  . Smoking status: Current Every Day Smoker    Packs/day: 1.00    Years: 50.00    Pack years: 50.00    Types: Cigarettes  . Smokeless tobacco: Never Used  . Tobacco comment: 3-4 cigaretters per day 11/20/2017   Substance and Sexual Activity  . Alcohol use: Yes    Alcohol/week: 0.0 standard drinks    Comment: occasional  . Drug use: No  . Sexual activity: Not on file  Lifestyle  . Physical activity    Days per week: 0 days    Minutes per session: 0 min  . Stress: Not on file  Relationships  . Social connections    Talks on phone: Not on file  Gets together: Not on file    Attends religious service: Not on file    Active member of club or organization: Not on file    Attends meetings of clubs or organizations: Not on file    Relationship status: Not on file  Other Topics Concern  . Not on file  Social History Narrative   Married but recently separated from husband     Observations/Objective:  Lab Results  Component Value Date   WBC 9.6 07/01/2017   HGB 13.7 07/01/2017   HCT 43.7 07/01/2017   PLT 294.0 07/01/2017   GLUCOSE 105 (H) 07/01/2017   CHOL 232 (H) 07/01/2017   TRIG 233.0 (H) 07/01/2017   HDL 52.60 07/01/2017   LDLDIRECT 141.0 07/01/2017   LDLCALC 105 (H) 01/20/2015   ALT 16 07/01/2017   AST 16 07/01/2017   NA 143 07/01/2017   K 3.5  07/01/2017   CL 104 07/01/2017   CREATININE 1.48 (H) 07/01/2017   BUN 25 (H) 07/01/2017   CO2 27 07/01/2017   TSH 0.99 07/01/2017   HGBA1C 6.5 07/01/2017    Assessment and Plan:  See Problem List for Assessment and Plan of chronic medical problems.   Follow Up Instructions:    I discussed the assessment and treatment plan with the patient. The patient was provided an opportunity to ask questions and all were answered. The patient agreed with the plan and demonstrated an understanding of the instructions.   The patient was advised to call back or seek an in-person evaluation if the symptoms worsen or if the condition fails to improve as anticipated.   She is overdue for blood work-it has been over a year-we will arrange for home health nursing to get blood work done.  Follow-up in 6 months  Time spent on phone call:   15 minutes  Binnie Rail, MD

## 2018-08-19 NOTE — Telephone Encounter (Signed)
Patient is unable to do virtual visit or come in. I have made a phone call for 7/30.

## 2018-08-20 ENCOUNTER — Telehealth: Payer: Self-pay | Admitting: Internal Medicine

## 2018-08-20 ENCOUNTER — Ambulatory Visit (INDEPENDENT_AMBULATORY_CARE_PROVIDER_SITE_OTHER): Payer: Medicare Other | Admitting: Internal Medicine

## 2018-08-20 ENCOUNTER — Encounter: Payer: Self-pay | Admitting: Internal Medicine

## 2018-08-20 ENCOUNTER — Other Ambulatory Visit: Payer: Self-pay

## 2018-08-20 ENCOUNTER — Other Ambulatory Visit: Payer: Medicare Other | Admitting: *Deleted

## 2018-08-20 DIAGNOSIS — I5032 Chronic diastolic (congestive) heart failure: Secondary | ICD-10-CM | POA: Diagnosis not present

## 2018-08-20 DIAGNOSIS — J9611 Chronic respiratory failure with hypoxia: Secondary | ICD-10-CM | POA: Diagnosis not present

## 2018-08-20 DIAGNOSIS — E119 Type 2 diabetes mellitus without complications: Secondary | ICD-10-CM | POA: Diagnosis not present

## 2018-08-20 DIAGNOSIS — Z794 Long term (current) use of insulin: Secondary | ICD-10-CM

## 2018-08-20 DIAGNOSIS — Z515 Encounter for palliative care: Secondary | ICD-10-CM

## 2018-08-20 DIAGNOSIS — K219 Gastro-esophageal reflux disease without esophagitis: Secondary | ICD-10-CM

## 2018-08-20 DIAGNOSIS — F419 Anxiety disorder, unspecified: Secondary | ICD-10-CM

## 2018-08-20 MED ORDER — ALPRAZOLAM 0.5 MG PO TABS: ORAL_TABLET | ORAL | 5 refills | Status: DC

## 2018-08-20 MED ORDER — PAROXETINE HCL 40 MG PO TABS: ORAL_TABLET | ORAL | 1 refills | Status: DC

## 2018-08-20 NOTE — Assessment & Plan Note (Signed)
Overall controlled Continue Paxil at current dose Continue Xanax Refill sent today

## 2018-08-20 NOTE — Progress Notes (Signed)
COMMUNITY PALLIATIVE CARE RN NOTE  PATIENT NAME: [name] DOB: [birth year] MRN: [id number]  PRIMARY CARE PROVIDER: Binnie Rail, MD  RESPONSIBLE PARTY:  Acct ID - Guarantor Home Phone Work Phone Relationship Acct Type  [id number] [facility] [phone]  Self P/F     [address] [place], [place], [place] [address]   Due to the COVID-19 crisis, this virtual check-in visit was done via telephone from my office and it was initiated and consent by this patient and or family.  PLAN OF CARE and INTERVENTION:  1. ADVANCE CARE PLANNING/GOALS OF CARE: Goal is for patient to remain in her home and prevent hospitalizations. 2. PATIENT/CAREGIVER EDUCATION: Respiratory symptom management, Safe Mobility 3. DISEASE STATUS: Virtual check-in visit completed via telephone. Patient denies pain. She reports that she had a Physical therapy session this am, but was unable to do much walking d/t dyspnea. Her therapist wants her to try and walk to the bathroom every 2 hours to improve her strength and endurance. When she woke up this morning, her oxygen level was 98% on 7L/min via [place] and [address]. She continues with dyspnea with exertion. She denies any distress during conversation, but states that she is mainly just sitting around and not engaging in much activity. She had a virtual visit with her PCP this am. She says PCP wants her to start taking Vitamin B12 and Vitamin D. She also authorized a refill for her Xanax prescription. She also feels that patient needs to have some labs drawn. Explained to patient that Palliative care does not provide this service. Patient is going to reach out to [facility] to see if they would be able to send a nurse for lab work since she is already receiving physical therapy by this company. She has a virtual visit with her Pulmonology office next week. She wants to know when she will be able to go to in-person doctor appointments again and if it is ok to decrease her Oxygen back to  6L/min. Will continue to monitor.    HISTORY OF PRESENT ILLNESS:  [name] who resides at home alone. Palliative care team continues to follow patient. Will continue to check in with patient or visit weekly and PRN.  CODE STATUS: Full Code  ADVANCED DIRECTIVES: N MOST FORM: yes PPS: 40%   (Duration of visit and documentation 30 minutes)   Daryl Eastern, RN BSN

## 2018-08-20 NOTE — Assessment & Plan Note (Signed)
GERD controlled Continue daily medication  

## 2018-08-20 NOTE — Telephone Encounter (Signed)
Pt called and stated that she called [facility] an they can go to her house and do labs. Pt states that she needs [place] to fax orders. Please advise

## 2018-08-20 NOTE — Assessment & Plan Note (Signed)
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

## 2018-08-20 NOTE — Assessment & Plan Note (Signed)
Sounds to be euvolemic, but it is difficult to tell for sure given that this is a telephone visit She is not experiencing any leg swelling and shortness of breath seems to be in line with her chronic respiratory failure CMP

## 2018-08-20 NOTE — Assessment & Plan Note (Addendum)
Following with pulmonary On oxygen 24/7-currently on 7 L/min-advised she most likely can reduce oxygen while at rest, but needs to monitor Follow-up with pulmonary Continue inhalers

## 2018-08-20 NOTE — Telephone Encounter (Signed)
CBC, CMP, TSH, lipid panel, A1c  Diagnosis: COPD, diabetes, hyperlipidemia

## 2018-08-21 NOTE — Telephone Encounter (Signed)
Called [facility] to make sure I got the correct fax number to send orders to. Fax # [phone]. I have faxed over written orders for patient to have labs drawn.

## 2018-08-21 NOTE — Progress Notes (Addendum)
Subjective:   Cybil Ketcherside is a 73 y.o. female who presents for an Initial Medicare Annual Wellness Visit. I connected with patient by a telephone and verified that I am speaking with the correct person using two identifiers. Patient stated full name and DOB. Patient gave permission to continue with telephonic visit. Patient's location was at home and Nurse's location was at [place] office.   Review of Systems    Cardiac Risk Factors include: advanced age (>49men, >66 women);diabetes mellitus;dyslipidemia;hypertension Sleep patterns: gets up 2 times nightly to void and sleeps 6-7 hours nightly.    Home Safety/Smoke Alarms: Feels safe in home. Smoke alarms in place.  Living environment; residence and Firearm Safety: apartment. Palliative Care Patient. An aide comes x2 weekly to assist with ADLs. Lives alone, limited support system. Seat Belt Safety/Bike Helmet: Wears seat belt.    Objective:    There were no vitals filed for this visit. There is no height or weight on file to calculate BMI.  Advanced Directives 08/24/2018 03/26/2018 11/27/2017 11/03/2017 02/24/2016 02/22/2016 02/22/2016  Does Patient Have a Medical Advance Directive? No No No No No No No  Would patient like information on creating a medical advance directive? No - Patient declined No - Patient declined No - Patient declined No - Patient declined No - Patient declined No - Patient declined No - Patient declined  Pre-existing out of facility DNR order (yellow form or pink MOST form) - - - - - - -    Current Medications (verified) Outpatient Encounter Medications as of 08/24/2018  Medication Sig  . albuterol (PROVENTIL HFA;VENTOLIN HFA) 108 (90 Base) MCG/ACT inhaler Inhale 2 puffs into the lungs every 6 (six) hours as needed for wheezing or shortness of breath.  Marland Kitchen albuterol (PROVENTIL) (2.5 MG/3ML) 0.083% nebulizer solution Take 2.5 mg by nebulization every 6 (six) hours as needed for wheezing or shortness of breath.  . ALPRAZolam  (XANAX) 0.5 MG tablet TAKE 1 TABLET BY MOUTH THREE TIMES A DAY AS NEEDED FOR ANXIETY  . fluticasone (FLONASE) 50 MCG/ACT nasal spray USE 2 SPRAYS IN EACH NOSTRIL DAILY AS NEEDED  . Fluticasone-Umeclidin-Vilant (TRELEGY ELLIPTA) 100-62.5-25 MCG/INH AEPB Inhale 1 puff into the lungs daily.  . furosemide (LASIX) 40 MG tablet Take 40 mg by mouth daily.   Marland Kitchen GLUCOCOM LANCETS 33G MISC Use with Test Strips to take blood sugars  . glucose blood (CONTOUR NEXT TEST) test strip USE TO TAKE BLOOD SUGARS TWICE DAILY.  Marland Kitchen Insulin Glargine (LANTUS SOLOSTAR) 100 UNIT/ML Solostar Pen INJECT 32 UNITS INTO THE SKIN DAILY AT 10 PM. (Patient taking differently: INJECT 22 UNITS INTO THE SKIN DAILY AT 10 PM.)  . Insulin Pen Needle (BD PEN NEEDLE NANO U/F) 32G X 4 MM MISC USE TO ADMINISTER LANTUS INSULIN  . nystatin (MYCOSTATIN) 100000 UNIT/ML suspension Take 5 mLs (500,000 Units total) by mouth 4 (four) times daily.  . pantoprazole (PROTONIX) 40 MG tablet Take 1 tablet (40 mg total) by mouth daily.  Marland Kitchen PARoxetine (PAXIL) 40 MG tablet TAKE 1 TABLET BY MOUTH EVERY DAY IN THE MORNING.  . predniSONE (DELTASONE) 20 MG tablet Take 1 tablet (20 mg total) by mouth daily with breakfast.  . triamcinolone cream (KENALOG) 0.1 % Apply 1 application topically 2 (two) times daily.   No facility-administered encounter medications on file as of 08/24/2018.     Allergies (verified) Ambien [zolpidem tartrate]   History: Past Medical History:  Diagnosis Date  . Arrhythmia   . Bronchiectasis march 2012   On  CT chest. RUL. Mild  . CHF (congestive heart failure) ([place])   . Chicken pox   . Chronic diastolic heart failure ([place])   . Chronic respiratory failure ([place])    Followed in Pulmonary clinic/ [facility]/ Ramaswamy  - 06/30/2012 desat to 86%  RA walking 50 ft, recovered to 90% at rest - 06/30/2012  Walked 1lpm x 3 laps @ 185 ft each stopped due to  Sob, no desat  rec 02 2lpm with activity and sleeping, ok at rest   . COPD  (chronic obstructive pulmonary disease) ([place])     FEV-1 in 2008 was 63% with a diffusion capacity of 33%.   . Generalized anxiety disorder   . History of cervical cancer 1982  . Hyperlipidemia   . Hypertension   . Leg swelling    Venous doppler right 07/21/12 >>Neg    . 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 Medical non-compliance   . MITRAL VALVE PROLAPSE   . Obesity, unspecified   . Pulmonary nodule 03/2010   72mm RUL and RLL 1st seen march 2012 CT, ?progression 12/2012 CT  . Seasonal allergies   . Tobacco abuse     Smokes one pack a day since age 83.   Past Surgical History:  Procedure Laterality Date  . TOTAL ABDOMINAL HYSTERECTOMY     Family History  Problem Relation Age of Onset  . Other Father        MVA  . Esophageal cancer Mother    Social History   Socioeconomic History  . Marital status: Divorced    Spouse name: Not on file  . Number of children: 0  . Years of education: Not on file  . Highest education level: Not on file  Occupational History  . Occupation: retired  Scientific laboratory technician  . Financial resource strain: Not very hard  . Food insecurity    Worry: Never true    Inability: Never true  . Transportation needs    Medical: Yes    Non-medical: No  Tobacco Use  . Smoking status: Current Every Day Smoker    Packs/day: 0.25    Years: 50.00    Pack years: 12.50    Types: Cigarettes  . Smokeless tobacco: Never Used  . Tobacco comment: 3-4 cigaretters per day 11/20/2017   Substance and Sexual Activity  . Alcohol use: Yes    Alcohol/week: 0.0 standard drinks    Comment: occasional  . Drug use: No  . Sexual activity: Not Currently  Lifestyle  . Physical activity    Days per week: 0 days    Minutes per session: 0 min  . Stress: To some extent  Relationships  . Social Herbalist on phone: Never    Gets together: Never    Attends religious service: Not on file    Active member of club or organization:  Not on file    Attends meetings of clubs or organizations: Not on file    Relationship status: Divorced  Other Topics Concern  . Not on file  Social History Narrative   Married but recently separated from husband    Tobacco Counseling Ready to quit: No Counseling given: No Comment: 3-4 cigaretters per day 11/20/2017   Activities of Daily Living In your present state of health, do you have any difficulty performing the following activities: 08/24/2018 03/26/2018  Hearing? N N  Vision? N Y  Comment - Recently cancelled appointment  with eye doctor.  Difficulty concentrating or making decisions? N N  Walking or climbing stairs? N N  Dressing or bathing? N N  Doing errands, shopping? N N  Preparing Food and eating ? N N  Using the Toilet? N N  In the past six months, have you accidently leaked urine? N N  Do you have problems with loss of bowel control? N N  Managing your Medications? N N  Managing your Finances? N N  Housekeeping or managing your Housekeeping? N N  Some recent data might be hidden     Immunizations and Health Maintenance Immunization History  Administered Date(s) Administered  . Influenza Split 11/12/2010  . Influenza, High Dose Seasonal PF 11/09/2012, 10/31/2015, 11/20/2017  . Influenza,inj,Quad PF,6+ Mos 10/20/2014  . Pneumococcal Conjugate-13 05/17/2013  . Pneumococcal Polysaccharide-23 03/22/2010, 06/24/2016   Health Maintenance Due  Topic Date Due  . URINE MICROALBUMIN  05/01/1957  . TETANUS/TDAP  05/02/1966  . HEMOGLOBIN A1C  12/31/2017  . FOOT EXAM  07/02/2018  . INFLUENZA VACCINE  08/22/2018    Patient Care Team: Binnie Rail, MD as PCP - General (Internal Medicine) Tanda Rockers, MD (Pulmonary Disease) Brand Males, MD (Pulmonary Disease) Duffy, Creola Corn, LCSW as Social Worker (Licensed Clinical Social Worker) Conan Bowens, RN as Registered Nurse (Hospice and Palliative Medicine) Leona Singleton, RN as [place] any recent [place] you may have received from other than [facility] providers in the past year (date may be approximate).     Assessment:   This is a routine wellness examination for Briel. Physical assessment deferred to PCP.  Hearing/Vision screen  Hearing Screening   125Hz  250Hz  500Hz  1000Hz  2000Hz  3000Hz  4000Hz  6000Hz  8000Hz   Right ear:           Left ear:           Comments: Able to hear conversational tones w/o difficulty. No issues reported.    Vision Screening Comments: appointment yearly Dr. Delman Cheadle  Dietary issues and exercise activities discussed: Current Exercise Habits: The patient does not participate in regular exercise at present, Exercise limited by: respiratory conditions(s) Diet (meal preparation, eat out, water intake, caffeinated beverages, dairy products, fruits and vegetables): in general, a "healthy" diet     Reviewed heart healthy and diabetic diet. Encouraged patient to increase daily water and healthy fluid intake.  Goals   None    Depression Screen PHQ 2/9 Scores 08/24/2018 03/26/2018 11/27/2017 11/03/2017 06/24/2016 01/27/2015 07/12/2013  PHQ - 2 Score 4 2 0 2 0 2 2  PHQ- 9 Score 10 13 - 12 - 5 13    Fall Risk Fall Risk  08/24/2018 08/11/2018 05/07/2018 03/26/2018 03/16/2018  Falls in the past year? 1 1 1 1 1   Comment - last fall October 2019 Last Fall October 2019 - no falls since October 2019  Number falls in past yr: 1 1 1 1 1   Injury with Fall? 0 0 0 0 0  Risk for fall due to : Impaired balance/gait;Impaired mobility History of fall(s);Medication side effect;Impaired balance/gait;Impaired mobility;Impaired vision History of fall(s);Impaired vision;Medication side effect;Impaired balance/gait;Impaired mobility History of fall(s);Impaired balance/gait;Impaired mobility;Impaired vision;Medication side effect History of fall(s);Impaired vision;Medication side effect;Impaired balance/gait;Impaired mobility  Follow up Falls prevention  discussed Falls evaluation completed;Education provided;Falls prevention discussed Falls evaluation completed;Falls prevention discussed;Education provided Education provided;Falls prevention discussed Falls evaluation completed;Falls prevention discussed;Education provided   Cognitive Function:       Ad8 score reviewed for issues:  Issues making decisions: no  Less interest in hobbies / activities: no  Repeats questions, stories (family complaining): no  Trouble using ordinary gadgets (microwave, computer, phone):no  Forgets the month or year: no  Mismanaging finances: no  Remembering appts: no  Daily problems with thinking and/or memory: no Ad8 score is= 0  Screening Tests Health Maintenance  Topic Date Due  . URINE MICROALBUMIN  05/01/1957  . TETANUS/TDAP  05/02/1966  . HEMOGLOBIN A1C  12/31/2017  . FOOT EXAM  07/02/2018  . INFLUENZA VACCINE  08/22/2018  . MAMMOGRAM  08/24/2019 (Originally 02/23/2014)  . Hepatitis C Screening  07/01/2028 (Originally 11-01-1945)  . OPHTHALMOLOGY EXAM  03/30/2019  . Fecal DNA (Cologuard)  07/23/2020  . DEXA SCAN  Completed  . PNA vac Low Risk Adult  Completed     Plan:    Reviewed health maintenance screenings with patient today and relevant education, vaccines, and/or referrals were provided.   Continue to eat heart healthy diet (full of fruits, vegetables, whole grains, lean protein, water--limit salt, fat, and sugar intake) and increase physical activity as tolerated.  I have personally reviewed and noted the following in the patient's chart:   . Medical and social history . Use of alcohol, tobacco or illicit drugs  . Current medications and supplements . Functional ability and status . Nutritional status . Physical activity . Advanced directives . List of other physicians . Screenings to include cognitive, depression, and falls . Referrals and appointments  In addition, I have reviewed and discussed with patient certain  preventive protocols, quality metrics, and best practice recommendations. A written personalized care plan for preventive services as well as general preventive health recommendations were provided to patient.     Michiel Cowboy, RN   08/24/2018   Medical screening examination/treatment/procedure(s) were performed by non-physician practitioner and as supervising physician I was immediately available for consultation/collaboration. I agree with above. Scarlette Calico, MD

## 2018-08-24 ENCOUNTER — Ambulatory Visit (INDEPENDENT_AMBULATORY_CARE_PROVIDER_SITE_OTHER): Payer: Medicare Other | Admitting: *Deleted

## 2018-08-24 ENCOUNTER — Telehealth: Payer: Self-pay | Admitting: *Deleted

## 2018-08-24 DIAGNOSIS — Z0000 Encounter for general adult medical examination without abnormal findings: Secondary | ICD-10-CM

## 2018-08-24 NOTE — Telephone Encounter (Signed)
During AWV, patient stated that PCP was going to order blood work and have [place] to come draw the blood. Patient states she has not been contacted as of yet and wants to know if she will have the blood work done.

## 2018-08-25 ENCOUNTER — Telehealth: Payer: Self-pay | Admitting: *Deleted

## 2018-08-25 ENCOUNTER — Telehealth: Payer: Self-pay

## 2018-08-25 ENCOUNTER — Encounter: Payer: Self-pay | Admitting: Adult Health

## 2018-08-25 ENCOUNTER — Other Ambulatory Visit: Payer: Self-pay

## 2018-08-25 ENCOUNTER — Other Ambulatory Visit: Payer: Medicare Other | Admitting: *Deleted

## 2018-08-25 ENCOUNTER — Ambulatory Visit (INDEPENDENT_AMBULATORY_CARE_PROVIDER_SITE_OTHER): Payer: Medicare Other | Admitting: Adult Health

## 2018-08-25 DIAGNOSIS — J9612 Chronic respiratory failure with hypercapnia: Secondary | ICD-10-CM | POA: Diagnosis not present

## 2018-08-25 DIAGNOSIS — J441 Chronic obstructive pulmonary disease with (acute) exacerbation: Secondary | ICD-10-CM

## 2018-08-25 DIAGNOSIS — J9611 Chronic respiratory failure with hypoxia: Secondary | ICD-10-CM

## 2018-08-25 DIAGNOSIS — Z515 Encounter for palliative care: Secondary | ICD-10-CM

## 2018-08-25 NOTE — Telephone Encounter (Signed)
Copied from [place] [phone]. Topic: General - Inquiry >> Aug 25, 2018  3:51 PM Richardo Priest, NT wrote: Reason for CRM: Patient is wanting a call back from RN or PCP for some advice. Patient stated she was advised to go to the ED by her NP for pulmonary and she is too afraid to go. States she is having too many flares up with having high oxygen. Call back is [phone].

## 2018-08-25 NOTE — Telephone Encounter (Signed)
Pt was transferred to pulmonary. She was trying to get in touch with them not Dr. Quay Burow.

## 2018-08-25 NOTE — Telephone Encounter (Signed)
Orders have been refaxed.

## 2018-08-25 NOTE — Patient Instructions (Signed)
Go to ER . Call EMS for transport.

## 2018-08-25 NOTE — Telephone Encounter (Signed)
Received a call from patient to update me regarding her telephonic visit with Carolynn Serve, NP with [facility] Pulmonology this afternoon. She says that it was recommended that she go to the ED for further evaluation d/t worsening dyspnea. I advised her that I agree with her NP to go to the ED because her breathing has progressively worsened over the past few weeks. She states that she wants to speak with her family this evening to make arrangements first, and will go to the ED most likely tomorrow. Recommended that she not wait too long, and if breathing worsens any further to call 911 immediately. She is agreeable with this plan.

## 2018-08-25 NOTE — Progress Notes (Signed)
Virtual Visit via Telephone Note  I connected with [name] on 08/25/18 at  2:00 PM EDT by telephone and verified that I am speaking with the correct person using two identifiers.  Location: Patient: Home  Provider: Office    I discussed the limitations, risks, security and privacy concerns of performing an evaluation and management service by telephone and the availability of in person appointments. I also discussed with the patient that there may be a patient responsible charge related to this service. The patient expressed understanding and agreed to proceed.   History of Present Illness: 73 yo female active smoker followed for severe COPD , O2 dependent . 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  She was given a 7-day course of doxycycline.  And recommended to use prednisone 30 mg daily for 1 week and then resume 20 mg daily dosing.  Patient says she did improve for short period of time.  However had significant diarrhea last night.  Felt that possibly the food that she ate may have contributed.  Says that her bottom is very irritated she is having to use Desitin.  Says that she has been weak since having all the diarrhea.  Also her breathing has not been as good the last 1 to 2 days.  Today O2 saturations were 74% to 88% on several liters of oxygen.  She is followed by palliative care.  Palliative care has recommended she change to hospice however she continues on palliative care.  Patient says that breathing is not doing as good.  She gets short of breath with minimal activity.  O2 saturations dropped on 7 L.  Her baseline has been 5 L.  She is recently turned this up to keep O2 saturations greater than 88%.  She remains on Trelegy inhaler daily.  Uses albuterol nebulizer twice daily most days.  She denies any fever chest pain orthopnea or  increased leg swelling.  Appetite is been low.    Observations/Objective: Reported home O2 saturation 74-88%on  7 L of oxygen  Assessment and Plan: Acute on chronic hypoxic respiratory failure with possible underlying COPD exacerbation. Patient has been progressively worsening with her COPD.  Now requiring increased oxygen at higher flow 7 L.  To obtain o2 sats at 88%  Patient is very weak on the phone now requiring increased oxygen has not been unable to come into the office for further evaluation and chest x-ray and labs.  Have recommend that she get evaluated in the emergency room and call EMS for transport.  Patient is in agreement.  Plan  Please go to the emergency room contact EMS services to transport for evaluation and transport to the ER. May require hospitalization for further evaluation and treatment options. If not may need to consider transitioning to hospice care  Follow Up Instructions: Follow-up from ER visit in 1 week and or post hospitalization  Please contact office for sooner follow up if symptoms do not improve or worsen or seek emergency care      I discussed the assessment and treatment plan with the patient. The patient was provided an opportunity to ask questions and all were answered. The patient agreed with the plan and demonstrated an understanding of the instructions.   The patient was advised to call back or seek an in-person evaluation if the symptoms worsen or if the condition fails to improve  as anticipated.  I provided 22  minutes of non-face-to-face time during this encounter.   [name], NP

## 2018-08-25 NOTE — Progress Notes (Signed)
PATIENT NAME: [name] DOB: 01/08/1946 MRN: [id number]  PRIMARY CARE PROVIDER: Binnie Rail, MD  RESPONSIBLE PARTY:  Acct ID - Guarantor Home Phone Work Phone Relationship Acct Type  [id number] [facility] [employer] [phone]  Self P/F     [address], [place], [place] [address]   Covid-19 Pre-screening Negative  PLAN OF CARE and INTERVENTION:  1. ADVANCE CARE PLANNING/GOALS OF CARE: Goal is to remain in her home and avoid hospitalizations. 2. PATIENT/CAREGIVER EDUCATION: Rash management, Respiratory symptom management 3. DISEASE STATUS: Met with patient in her home. She reports that she had some diarrhea yesterday and developed a rash on her bottom, that is sore when sitting. She does wear adult briefs. Recommended that she start using a product with Zinc Oxide to help with rash. She had her caregiver to buy her some Desitin and will have her to apply it after she assists her with her bath today. She continues with dyspnea on exertion and some during conversation today. Her O2 sats initially started at 74% and came up to 87% on 7L/min via [place]. She has a virtual appointment with her Pulmonology office today. Voice sounds somewhat hoarse today. She says that she got "hot" throughout the night and had some issues with breathing. Advised that she take her Albuterol nebulizer treatment, which she has not done as of yet today. Continue to encourage her to use her nebulizer at least 3 times/day to help. She continues with Xanax PRN anxiety/sob. She remains alert and oriented and able to engage in appropriate conversation. She remains active with home health PT through [facility]. Will continue to monitor.  HISTORY OF PRESENT ILLNESS:  [name] who resides at home. Palliative Care team continues to follow patient. Will continue to check in/visit with patient weekly and PRN.  CODE STATUS: Full Code ADVANCED DIRECTIVES: N MOST FORM: yes PPS: 50%   PHYSICAL EXAM:    VITALS: Today's Vitals   08/25/18 1030  BP: (!) 111/59  Pulse: (!) 108  Resp: 20  Temp: (!) 97.5 F (36.4 C)  TempSrc: Temporal  SpO2: (!) 87%  PainSc: 4   PainLoc: Buttocks    LUNGS: decreased breath sounds CARDIAC: Cor Tachy EXTREMITIES: No edema SKIN: Exposed skin is dry and intact  NEURO: Alert and oriented x 3, generalized weakness, ambulatory with walker   (Duration of visit and documentation 75 minutes)    Daryl Eastern, RN BSN

## 2018-08-25 NOTE — Telephone Encounter (Signed)
Copied from [place] [phone]. Topic: General - Other >> Aug 24, 2018 11:56 AM Valla Leaver wrote: Reason for CRM: Patient says on wed 08/19/18 Dr. Quay Burow was supposed to fax an order for [facility] to come out and draw labs for her at her home and has not heard back about getting this done. Please advise.

## 2018-08-25 NOTE — Telephone Encounter (Signed)
Received phone call from [place] RN inquiring if [facility] PT was involved in patient's care, would if be possible for them to help with obtaining lab work. Reached out to [place] with [facility] to ask.

## 2018-08-26 ENCOUNTER — Emergency Department (HOSPITAL_COMMUNITY): Payer: Medicare Other

## 2018-08-26 ENCOUNTER — Other Ambulatory Visit: Payer: Self-pay

## 2018-08-26 ENCOUNTER — Inpatient Hospital Stay (HOSPITAL_COMMUNITY)
Admission: EM | Admit: 2018-08-26 | Discharge: 2018-09-05 | DRG: 189 | Disposition: A | Discharge status: Skilled Nursing Facility | Payer: Medicare Other | Attending: Family Medicine | Admitting: Family Medicine

## 2018-08-26 ENCOUNTER — Other Ambulatory Visit: Payer: Medicare Other | Admitting: *Deleted

## 2018-08-26 DIAGNOSIS — I5033 Acute on chronic diastolic (congestive) heart failure: Secondary | ICD-10-CM | POA: Diagnosis present

## 2018-08-26 DIAGNOSIS — E876 Hypokalemia: Secondary | ICD-10-CM | POA: Diagnosis not present

## 2018-08-26 DIAGNOSIS — R069 Unspecified abnormalities of breathing: Secondary | ICD-10-CM | POA: Diagnosis not present

## 2018-08-26 DIAGNOSIS — Z515 Encounter for palliative care: Secondary | ICD-10-CM

## 2018-08-26 DIAGNOSIS — K219 Gastro-esophageal reflux disease without esophagitis: Secondary | ICD-10-CM | POA: Diagnosis present

## 2018-08-26 DIAGNOSIS — Z7952 Long term (current) use of systemic steroids: Secondary | ICD-10-CM | POA: Diagnosis not present

## 2018-08-26 DIAGNOSIS — F419 Anxiety disorder, unspecified: Secondary | ICD-10-CM | POA: Diagnosis present

## 2018-08-26 DIAGNOSIS — E119 Type 2 diabetes mellitus without complications: Secondary | ICD-10-CM

## 2018-08-26 DIAGNOSIS — T380X5A Adverse effect of glucocorticoids and synthetic analogues, initial encounter: Secondary | ICD-10-CM | POA: Diagnosis not present

## 2018-08-26 DIAGNOSIS — D72829 Elevated white blood cell count, unspecified: Secondary | ICD-10-CM | POA: Diagnosis not present

## 2018-08-26 DIAGNOSIS — I110 Hypertensive heart disease with heart failure: Secondary | ICD-10-CM | POA: Diagnosis present

## 2018-08-26 DIAGNOSIS — E1165 Type 2 diabetes mellitus with hyperglycemia: Secondary | ICD-10-CM | POA: Diagnosis not present

## 2018-08-26 DIAGNOSIS — F411 Generalized anxiety disorder: Secondary | ICD-10-CM | POA: Diagnosis present

## 2018-08-26 DIAGNOSIS — E875 Hyperkalemia: Secondary | ICD-10-CM

## 2018-08-26 DIAGNOSIS — J9622 Acute and chronic respiratory failure with hypercapnia: Secondary | ICD-10-CM | POA: Diagnosis present

## 2018-08-26 DIAGNOSIS — I5031 Acute diastolic (congestive) heart failure: Secondary | ICD-10-CM | POA: Diagnosis not present

## 2018-08-26 DIAGNOSIS — Z9981 Dependence on supplemental oxygen: Secondary | ICD-10-CM

## 2018-08-26 DIAGNOSIS — F17210 Nicotine dependence, cigarettes, uncomplicated: Secondary | ICD-10-CM | POA: Diagnosis present

## 2018-08-26 DIAGNOSIS — R0902 Hypoxemia: Secondary | ICD-10-CM | POA: Diagnosis not present

## 2018-08-26 DIAGNOSIS — I341 Nonrheumatic mitral (valve) prolapse: Secondary | ICD-10-CM | POA: Diagnosis present

## 2018-08-26 DIAGNOSIS — Z888 Allergy status to other drugs, medicaments and biological substances status: Secondary | ICD-10-CM

## 2018-08-26 DIAGNOSIS — I2781 Cor pulmonale (chronic): Secondary | ICD-10-CM | POA: Diagnosis present

## 2018-08-26 DIAGNOSIS — Z8541 Personal history of malignant neoplasm of cervix uteri: Secondary | ICD-10-CM

## 2018-08-26 DIAGNOSIS — R278 Other lack of coordination: Secondary | ICD-10-CM | POA: Diagnosis not present

## 2018-08-26 DIAGNOSIS — E669 Obesity, unspecified: Secondary | ICD-10-CM | POA: Diagnosis present

## 2018-08-26 DIAGNOSIS — E873 Alkalosis: Secondary | ICD-10-CM | POA: Diagnosis not present

## 2018-08-26 DIAGNOSIS — F329 Major depressive disorder, single episode, unspecified: Secondary | ICD-10-CM | POA: Diagnosis present

## 2018-08-26 DIAGNOSIS — J9621 Acute and chronic respiratory failure with hypoxia: Secondary | ICD-10-CM | POA: Diagnosis not present

## 2018-08-26 DIAGNOSIS — E785 Hyperlipidemia, unspecified: Secondary | ICD-10-CM | POA: Diagnosis present

## 2018-08-26 DIAGNOSIS — Z209 Contact with and (suspected) exposure to unspecified communicable disease: Secondary | ICD-10-CM | POA: Diagnosis not present

## 2018-08-26 DIAGNOSIS — Z743 Need for continuous supervision: Secondary | ICD-10-CM | POA: Diagnosis not present

## 2018-08-26 DIAGNOSIS — R0689 Other abnormalities of breathing: Secondary | ICD-10-CM | POA: Diagnosis not present

## 2018-08-26 DIAGNOSIS — G4733 Obstructive sleep apnea (adult) (pediatric): Secondary | ICD-10-CM | POA: Diagnosis present

## 2018-08-26 DIAGNOSIS — I509 Heart failure, unspecified: Secondary | ICD-10-CM | POA: Diagnosis not present

## 2018-08-26 DIAGNOSIS — J449 Chronic obstructive pulmonary disease, unspecified: Secondary | ICD-10-CM | POA: Diagnosis present

## 2018-08-26 DIAGNOSIS — Z800 Family history of malignant neoplasm of digestive organs: Secondary | ICD-10-CM

## 2018-08-26 DIAGNOSIS — J441 Chronic obstructive pulmonary disease with (acute) exacerbation: Secondary | ICD-10-CM | POA: Diagnosis present

## 2018-08-26 DIAGNOSIS — R52 Pain, unspecified: Secondary | ICD-10-CM | POA: Diagnosis not present

## 2018-08-26 DIAGNOSIS — Z6837 Body mass index (BMI) 37.0-37.9, adult: Secondary | ICD-10-CM | POA: Diagnosis not present

## 2018-08-26 DIAGNOSIS — Z20828 Contact with and (suspected) exposure to other viral communicable diseases: Secondary | ICD-10-CM | POA: Diagnosis present

## 2018-08-26 DIAGNOSIS — R2689 Other abnormalities of gait and mobility: Secondary | ICD-10-CM | POA: Diagnosis not present

## 2018-08-26 DIAGNOSIS — J80 Acute respiratory distress syndrome: Secondary | ICD-10-CM | POA: Diagnosis not present

## 2018-08-26 DIAGNOSIS — R279 Unspecified lack of coordination: Secondary | ICD-10-CM | POA: Diagnosis not present

## 2018-08-26 DIAGNOSIS — Z7951 Long term (current) use of inhaled steroids: Secondary | ICD-10-CM | POA: Diagnosis not present

## 2018-08-26 DIAGNOSIS — J90 Pleural effusion, not elsewhere classified: Secondary | ICD-10-CM | POA: Diagnosis not present

## 2018-08-26 DIAGNOSIS — R092 Respiratory arrest: Secondary | ICD-10-CM | POA: Diagnosis not present

## 2018-08-26 DIAGNOSIS — R0602 Shortness of breath: Secondary | ICD-10-CM

## 2018-08-26 DIAGNOSIS — F32A Depression, unspecified: Secondary | ICD-10-CM | POA: Diagnosis present

## 2018-08-26 DIAGNOSIS — J9601 Acute respiratory failure with hypoxia: Secondary | ICD-10-CM

## 2018-08-26 DIAGNOSIS — J9600 Acute respiratory failure, unspecified whether with hypoxia or hypercapnia: Secondary | ICD-10-CM | POA: Diagnosis not present

## 2018-08-26 DIAGNOSIS — Z794 Long term (current) use of insulin: Secondary | ICD-10-CM | POA: Diagnosis not present

## 2018-08-26 DIAGNOSIS — I5032 Chronic diastolic (congestive) heart failure: Secondary | ICD-10-CM | POA: Diagnosis not present

## 2018-08-26 DIAGNOSIS — J9610 Chronic respiratory failure, unspecified whether with hypoxia or hypercapnia: Secondary | ICD-10-CM | POA: Diagnosis not present

## 2018-08-26 HISTORY — DX: Other specified health status: Z78.9

## 2018-08-26 LAB — CBC WITH DIFFERENTIAL/PLATELET
Abs Immature Granulocytes: 0.12 10*3/uL — ABNORMAL HIGH (ref 0.00–0.07)
Basophils Absolute: 0 10*3/uL (ref 0.0–0.1)
Basophils Relative: 0 %
Eosinophils Absolute: 0 10*3/uL (ref 0.0–0.5)
Eosinophils Relative: 0 %
HCT: 40 % (ref 36.0–46.0)
Hemoglobin: 10.1 g/dL — ABNORMAL LOW (ref 12.0–15.0)
Immature Granulocytes: 1 %
Lymphocytes Relative: 7 %
Lymphs Abs: 1.1 10*3/uL (ref 0.7–4.0)
MCH: 20.8 pg — ABNORMAL LOW (ref 26.0–34.0)
MCHC: 25.3 g/dL — ABNORMAL LOW (ref 30.0–36.0)
MCV: 82.3 fL (ref 80.0–100.0)
Monocytes Absolute: 1.2 10*3/uL — ABNORMAL HIGH (ref 0.1–1.0)
Monocytes Relative: 8 %
Neutro Abs: 13.3 10*3/uL — ABNORMAL HIGH (ref 1.7–7.7)
Neutrophils Relative %: 84 %
Platelets: 563 10*3/uL — ABNORMAL HIGH (ref 150–400)
RBC: 4.86 MIL/uL (ref 3.87–5.11)
RDW: 19.9 % — ABNORMAL HIGH (ref 11.5–15.5)
WBC: 15.6 10*3/uL — ABNORMAL HIGH (ref 4.0–10.5)
nRBC: 2.5 % — ABNORMAL HIGH (ref 0.0–0.2)

## 2018-08-26 LAB — POCT I-STAT 7, (LYTES, BLD GAS, ICA,H+H)
Acid-Base Excess: 2 mmol/L (ref 0.0–2.0)
Bicarbonate: 30.9 mmol/L — ABNORMAL HIGH (ref 20.0–28.0)
Calcium, Ion: 1.31 mmol/L (ref 1.15–1.40)
HCT: 36 % (ref 36.0–46.0)
Hemoglobin: 12.2 g/dL (ref 12.0–15.0)
O2 Saturation: 90 %
Potassium: 5.6 mmol/L — ABNORMAL HIGH (ref 3.5–5.1)
Sodium: 139 mmol/L (ref 135–145)
TCO2: 33 mmol/L — ABNORMAL HIGH (ref 22–32)
pCO2 arterial: 72.5 mmHg (ref 32.0–48.0)
pH, Arterial: 7.237 — ABNORMAL LOW (ref 7.350–7.450)
pO2, Arterial: 70 mmHg — ABNORMAL LOW (ref 83.0–108.0)

## 2018-08-26 LAB — BASIC METABOLIC PANEL
Anion gap: 10 (ref 5–15)
Anion gap: 11 (ref 5–15)
BUN: 25 mg/dL — ABNORMAL HIGH (ref 8–23)
BUN: 26 mg/dL — ABNORMAL HIGH (ref 8–23)
CO2: 24 mmol/L (ref 22–32)
CO2: 28 mmol/L (ref 22–32)
Calcium: 9 mg/dL (ref 8.9–10.3)
Calcium: 9.2 mg/dL (ref 8.9–10.3)
Chloride: 104 mmol/L (ref 98–111)
Chloride: 102 mmol/L (ref 98–111)
Creatinine, Ser: 1.31 mg/dL — ABNORMAL HIGH (ref 0.44–1.00)
Creatinine, Ser: 1.22 mg/dL — ABNORMAL HIGH (ref 0.44–1.00)
GFR calc Af Amer: 47 mL/min — ABNORMAL LOW (ref >60)
GFR calc Af Amer: 52 mL/min — ABNORMAL LOW (ref >60)
GFR calc non Af Amer: 41 mL/min — ABNORMAL LOW (ref >60)
GFR calc non Af Amer: 45 mL/min — ABNORMAL LOW (ref >60)
Glucose, Bld: 416 mg/dL — ABNORMAL HIGH (ref 70–99)
Glucose, Bld: 464 mg/dL — ABNORMAL HIGH (ref 70–99)
Potassium: 5.4 mmol/L — ABNORMAL HIGH (ref 3.5–5.1)
Potassium: 5.2 mmol/L — ABNORMAL HIGH (ref 3.5–5.1)
Sodium: 138 mmol/L (ref 135–145)
Sodium: 141 mmol/L (ref 135–145)

## 2018-08-26 LAB — GLUCOSE, CAPILLARY: Glucose-Capillary: 444 mg/dL — ABNORMAL HIGH (ref 70–99)

## 2018-08-26 LAB — TROPONIN I (HIGH SENSITIVITY)
Troponin I (High Sensitivity): 24 ng/L — ABNORMAL HIGH (ref <18)
Troponin I (High Sensitivity): 22 ng/L — ABNORMAL HIGH (ref <18)

## 2018-08-26 LAB — SARS CORONAVIRUS 2 BY RT PCR (HOSPITAL ORDER, PERFORMED IN ~~LOC~~ HOSPITAL LAB): SARS Coronavirus 2: NEGATIVE

## 2018-08-26 LAB — HEMOGLOBIN A1C
Hgb A1c MFr Bld: 9.1 % — ABNORMAL HIGH (ref 4.8–5.6)
Mean Plasma Glucose: 214.47 mg/dL

## 2018-08-26 LAB — BRAIN NATRIURETIC PEPTIDE: B Natriuretic Peptide: 374.2 pg/mL — ABNORMAL HIGH (ref 0.0–100.0)

## 2018-08-26 MED ORDER — INSULIN ASPART 100 UNIT/ML ~~LOC~~ SOLN
0.0000 [IU] | Freq: Three times a day (TID) | SUBCUTANEOUS | Status: DC
  Administered 2018-08-27 (×2): 15 [IU] via SUBCUTANEOUS
  Administered 2018-08-27 – 2018-08-28 (×2): 7 [IU] via SUBCUTANEOUS
  Administered 2018-08-28: 4 [IU] via SUBCUTANEOUS
  Administered 2018-08-28: 11 [IU] via SUBCUTANEOUS
  Administered 2018-08-29: 7 [IU] via SUBCUTANEOUS
  Administered 2018-08-30: 4 [IU] via SUBCUTANEOUS
  Administered 2018-08-30: 11 [IU] via SUBCUTANEOUS
  Administered 2018-08-31 – 2018-09-01 (×2): 7 [IU] via SUBCUTANEOUS
  Administered 2018-09-01: 17:00:00 4 [IU] via SUBCUTANEOUS
  Administered 2018-09-02: 3 [IU] via SUBCUTANEOUS
  Administered 2018-09-02: 11 [IU] via SUBCUTANEOUS
  Administered 2018-09-02: 7 [IU] via SUBCUTANEOUS
  Administered 2018-09-03: 11 [IU] via SUBCUTANEOUS
  Administered 2018-09-03: 4 [IU] via SUBCUTANEOUS
  Administered 2018-09-03: 11 [IU] via SUBCUTANEOUS
  Administered 2018-09-04: 4 [IU] via SUBCUTANEOUS
  Administered 2018-09-04: 3 [IU] via SUBCUTANEOUS
  Administered 2018-09-04: 15 [IU] via SUBCUTANEOUS
  Administered 2018-09-05: 3 [IU] via SUBCUTANEOUS
  Administered 2018-09-05: 7 [IU] via SUBCUTANEOUS
  Administered 2018-09-05: 3 [IU] via SUBCUTANEOUS

## 2018-08-26 MED ORDER — ACETAMINOPHEN 325 MG PO TABS
650.0000 mg | ORAL_TABLET | Freq: Four times a day (QID) | ORAL | Status: DC | PRN
  Administered 2018-08-27 – 2018-08-29 (×2): 650 mg via ORAL
  Filled 2018-08-26 (×2): qty 2

## 2018-08-26 MED ORDER — INSULIN ASPART 100 UNIT/ML ~~LOC~~ SOLN
0.0000 [IU] | Freq: Every day | SUBCUTANEOUS | Status: DC
  Administered 2018-08-27 – 2018-09-04 (×3): 2 [IU] via SUBCUTANEOUS

## 2018-08-26 MED ORDER — PANTOPRAZOLE SODIUM 40 MG PO TBEC
40.0000 mg | DELAYED_RELEASE_TABLET | Freq: Every day | ORAL | Status: DC
  Administered 2018-08-27 – 2018-09-05 (×10): 40 mg via ORAL
  Filled 2018-08-26 (×10): qty 1

## 2018-08-26 MED ORDER — IPRATROPIUM-ALBUTEROL 0.5-2.5 (3) MG/3ML IN SOLN
3.0000 mL | Freq: Four times a day (QID) | RESPIRATORY_TRACT | Status: DC
  Administered 2018-08-27 – 2018-08-28 (×6): 3 mL via RESPIRATORY_TRACT
  Filled 2018-08-26 (×6): qty 3

## 2018-08-26 MED ORDER — IPRATROPIUM BROMIDE HFA 17 MCG/ACT IN AERS
2.0000 | INHALATION_SPRAY | Freq: Once | RESPIRATORY_TRACT | Status: AC
  Administered 2018-08-26: 2 via RESPIRATORY_TRACT
  Filled 2018-08-26: qty 12.9

## 2018-08-26 MED ORDER — FLUTICASONE FUROATE-VILANTEROL 100-25 MCG/INH IN AEPB
1.0000 | INHALATION_SPRAY | Freq: Every day | RESPIRATORY_TRACT | Status: DC
  Administered 2018-08-27 – 2018-09-05 (×10): 1 via RESPIRATORY_TRACT
  Filled 2018-08-26: qty 28

## 2018-08-26 MED ORDER — UMECLIDINIUM BROMIDE 62.5 MCG/INH IN AEPB
1.0000 | INHALATION_SPRAY | Freq: Every day | RESPIRATORY_TRACT | Status: DC
  Administered 2018-08-27 – 2018-09-05 (×10): 1 via RESPIRATORY_TRACT
  Filled 2018-08-26 (×2): qty 7

## 2018-08-26 MED ORDER — FLUTICASONE-UMECLIDIN-VILANT 100-62.5-25 MCG/INH IN AEPB: 1.0000 | INHALATION_SPRAY | Freq: Every day | RESPIRATORY_TRACT | Status: DC

## 2018-08-26 MED ORDER — HEPARIN SODIUM (PORCINE) 5000 UNIT/ML IJ SOLN
5000.0000 [IU] | Freq: Three times a day (TID) | INTRAMUSCULAR | Status: DC
  Administered 2018-08-26 – 2018-09-05 (×30): 5000 [IU] via SUBCUTANEOUS
  Filled 2018-08-26 (×30): qty 1

## 2018-08-26 MED ORDER — PAROXETINE HCL 20 MG PO TABS
40.0000 mg | ORAL_TABLET | Freq: Every day | ORAL | Status: DC
  Administered 2018-08-27 – 2018-09-05 (×10): 40 mg via ORAL
  Filled 2018-08-26 (×11): qty 2

## 2018-08-26 MED ORDER — ACETAMINOPHEN 650 MG RE SUPP: 650.0000 mg | Freq: Four times a day (QID) | RECTAL | Status: DC | PRN

## 2018-08-26 MED ORDER — FUROSEMIDE 10 MG/ML IJ SOLN
40.0000 mg | Freq: Two times a day (BID) | INTRAMUSCULAR | Status: DC
  Administered 2018-08-27 – 2018-08-28 (×3): 40 mg via INTRAVENOUS
  Filled 2018-08-26 (×3): qty 4

## 2018-08-26 MED ORDER — FUROSEMIDE 10 MG/ML IJ SOLN
40.0000 mg | Freq: Once | INTRAMUSCULAR | Status: AC
  Administered 2018-08-26: 40 mg via INTRAVENOUS
  Filled 2018-08-26: qty 4

## 2018-08-26 MED ORDER — AEROCHAMBER PLUS FLO-VU LARGE MISC
Status: AC
  Administered 2018-08-26: 1
  Filled 2018-08-26: qty 1

## 2018-08-26 MED ORDER — ALBUTEROL SULFATE (2.5 MG/3ML) 0.083% IN NEBU: 2.5000 mg | INHALATION_SOLUTION | RESPIRATORY_TRACT | Status: DC | PRN

## 2018-08-26 MED ORDER — SENNOSIDES-DOCUSATE SODIUM 8.6-50 MG PO TABS: 1.0000 | ORAL_TABLET | Freq: Every evening | ORAL | Status: DC | PRN

## 2018-08-26 MED ORDER — ALPRAZOLAM 0.25 MG PO TABS
0.5000 mg | ORAL_TABLET | Freq: Three times a day (TID) | ORAL | Status: DC | PRN
  Administered 2018-08-27 – 2018-09-05 (×16): 0.5 mg via ORAL
  Filled 2018-08-26 (×17): qty 2

## 2018-08-26 MED ORDER — INSULIN ASPART 100 UNIT/ML ~~LOC~~ SOLN
14.0000 [IU] | Freq: Once | SUBCUTANEOUS | Status: AC
  Administered 2018-08-26: 14 [IU] via SUBCUTANEOUS

## 2018-08-26 MED ORDER — METHYLPREDNISOLONE SODIUM SUCC 40 MG IJ SOLR
40.0000 mg | Freq: Two times a day (BID) | INTRAMUSCULAR | Status: DC
  Administered 2018-08-27: 40 mg via INTRAVENOUS
  Filled 2018-08-26: qty 1

## 2018-08-26 MED ORDER — SODIUM ZIRCONIUM CYCLOSILICATE 10 G PO PACK
10.0000 g | PACK | Freq: Once | ORAL | Status: DC
  Filled 2018-08-26: qty 1

## 2018-08-26 MED ORDER — INSULIN GLARGINE 100 UNIT/ML ~~LOC~~ SOLN
22.0000 [IU] | Freq: Every day | SUBCUTANEOUS | Status: DC
  Administered 2018-08-26: 22 [IU] via SUBCUTANEOUS
  Filled 2018-08-26 (×2): qty 0.22

## 2018-08-26 MED ORDER — ALBUTEROL SULFATE HFA 108 (90 BASE) MCG/ACT IN AERS
10.0000 | INHALATION_SPRAY | Freq: Once | RESPIRATORY_TRACT | Status: AC
  Administered 2018-08-26: 10 via RESPIRATORY_TRACT
  Filled 2018-08-26: qty 6.7

## 2018-08-26 MED ORDER — SODIUM CHLORIDE 0.9% FLUSH
3.0000 mL | Freq: Two times a day (BID) | INTRAVENOUS | Status: DC
  Administered 2018-08-26 – 2018-09-05 (×19): 3 mL via INTRAVENOUS

## 2018-08-26 NOTE — ED Provider Notes (Signed)
[facility] EMERGENCY DEPARTMENT Provider Note   CSN: [id number] Arrival date & time: 08/26/18  1553    History   Chief Complaint Chief Complaint  Patient presents with  . Shortness of Breath    HPI [name] is a 73 y.o. female.     The history is provided by the patient.  Shortness of Breath Severity:  Moderate Onset quality:  Gradual Timing:  Constant Progression:  Worsening Chronicity:  Recurrent Context: smoke exposure (Patient on 5L for COPD, increasing need at home, no fever, continues to smoke. On chronic prednisone.)   Relieved by:  Rest and inhaler Worsened by:  Activity Associated symptoms: cough and wheezing   Associated symptoms: no abdominal pain, no chest pain, no ear pain, no fever, no rash, no sore throat, no sputum production and no vomiting   Risk factors: no hx of PE/DVT     Past Medical History:  Diagnosis Date  . Arrhythmia   . Bronchiectasis march 2012   On CT chest. RUL. Mild  . CHF (congestive heart failure) ([place])   . Chicken pox   . Chronic diastolic heart failure ([place])   . Chronic respiratory failure ([place])    Followed in Pulmonary clinic/ [facility]/ [facility]asw[facility]  - 06/30/2012 desat to 86%  RA walking 50 ft, recovered to 90% at rest - 06/30/2012  Walked 1lpm x 3 laps @ 185 ft each stopped due to  Sob, no desat  rec 02 2lpm with activity and sleeping, ok at rest   . COPD (chronic obstructive pulmonary disease) ([place])     FEV-1 in 2008 was 63% with a diffusion capacity of 33%.   . Generalized anxiety disorder   . History of cervical cancer 1982  . Hyperlipidemia   . Hypertension   . Leg swelling    Venous doppler right 07/21/12 >>Neg    . 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 Medical non-compliance   . MITRAL VALVE PROLAPSE   . Obesity, unspecified   . Pulmonary nodule 03/2010   36mm RUL and RLL 1st seen march 2012 CT, ?progression 12/2012 CT  . Seasonal allergies   .  Tobacco abuse     Smokes one pack a day since age 12.    Patient Active Problem List   Diagnosis Date Noted  . COPD with acute exacerbation ([place]) 01/26/2018  . Psoriasis 07/01/2017  . Depression 06/24/2016  . Pneumonia of both lungs due to infectious organism   . Chronic respiratory failure with hypoxia ([place])   . Pulmonary hypertension ([place])   . Physical deconditioning 11/15/2015  . GERD (gastroesophageal reflux disease) 11/01/2015  . 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 Diabetes ([place]) 01/23/2015  . Fatigue 01/20/2015  . Chronic obstructive pulmonary disease ([place]) 10/18/2014  . Obesity, unspecified 03/15/2013  . Chronic diastolic heart failure ([place]) 03/14/2013  . Chronic respiratory failure ([place]) 06/30/2012  . COPD (chronic obstructive pulmonary disease) ([place])   . Tobacco abuse   . Pulmonary nodule 03/22/2010  . 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 Bronchiectasis 03/22/2010  . Hyperlipidemia 06/13/2008  . Anxiety 02/20/2007  . MITRAL VALVE PROLAPSE 02/20/2007  . CERVICAL CANCER, HX OF 02/20/2007    Past Surgical History:  Procedure Laterality Date  . TOTAL ABDOMINAL HYSTERECTOMY       OB History   No obstetric history on file.      Home Medications    Prior to Admission medications   Medication Sig  Start Date End Date Taking? Authorizing Provider  albuterol (PROVENTIL HFA;VENTOLIN HFA) 108 (90 Base) MCG/ACT inhaler Inhale 2 puffs into the lungs every 6 (six) hours as needed for wheezing or shortness of breath. 12/24/17   Brand Males, MD  albuterol (PROVENTIL) (2.5 MG/3ML) 0.083% nebulizer solution Take 2.5 mg by nebulization every 6 (six) hours as needed for wheezing or shortness of breath.    [provider]  ALPRAZolam (XANAX) 0.5 MG tablet TAKE 1 TABLET BY MOUTH THREE TIMES A DAY AS NEEDED FOR ANXIETY 08/20/18   Burns, Claudina Lick, MD  fluticasone (FLONASE) 50 MCG/ACT nasal spray USE 2 SPRAYS IN EACH NOSTRIL DAILY AS NEEDED 08/17/18   Binnie Rail, MD   Fluticasone-Umeclidin-Vilant (TRELEGY ELLIPTA) 100-62.5-25 MCG/INH AEPB Inhale 1 puff into the lungs daily. 07/16/18   Brand Males, MD  furosemide (LASIX) 40 MG tablet Take 40 mg by mouth daily.     [provider]  GLUCOCOM LANCETS 33G MISC Use with Test Strips to take blood sugars 07/20/15   Burns, Claudina Lick, MD  glucose blood (CONTOUR NEXT TEST) test strip USE TO TAKE BLOOD SUGARS TWICE DAILY. 12/24/17   Binnie Rail, MD  Insulin Glargine (LANTUS SOLOSTAR) 100 UNIT/ML Solostar Pen INJECT 32 UNITS INTO THE SKIN DAILY AT 10 PM. Patient taking differently: INJECT 22 UNITS INTO THE SKIN DAILY AT 10 PM. 02/17/18   Burns, Claudina Lick, MD  Insulin Pen Needle (BD PEN NEEDLE NANO U/F) 32G X 4 MM MISC USE TO ADMINISTER LANTUS INSULIN 05/08/16   Binnie Rail, MD  nystatin (MYCOSTATIN) 100000 UNIT/ML suspension Take 5 mLs (500,000 Units total) by mouth 4 (four) times daily. 07/29/18   Parrett, Fonnie Mu, NP  pantoprazole (PROTONIX) 40 MG tablet Take 1 tablet (40 mg total) by mouth daily. 01/26/18   Binnie Rail, MD  PARoxetine (PAXIL) 40 MG tablet TAKE 1 TABLET BY MOUTH EVERY DAY IN THE MORNING. 08/20/18   Burns, Claudina Lick, MD  predniSONE (DELTASONE) 20 MG tablet Take 1 tablet (20 mg total) by mouth daily with breakfast. 07/29/18   Parrett, Fonnie Mu, NP  triamcinolone cream (KENALOG) 0.1 % Apply 1 application topically 2 (two) times daily. 07/15/17   Binnie Rail, MD    Family History Family History  Problem Relation Age of Onset  . Other Father        MVA  . Esophageal cancer Mother     Social History Social History   Tobacco Use  . Smoking status: Current Every Day Smoker    Packs/day: 0.25    Years: 50.00    Pack years: 12.50    Types: Cigarettes  . Smokeless tobacco: Never Used  . Tobacco comment: 3-4 cigaretters per day 11/20/2017   Substance Use Topics  . Alcohol use: Yes    Alcohol/week: 0.0 standard drinks    Comment: occasional  . Drug use: No     Allergies   Ambien  [zolpidem tartrate]   Review of Systems Review of Systems  Constitutional: Negative for chills and fever.  HENT: Negative for ear pain and sore throat.   Eyes: Negative for pain and visual disturbance.  Respiratory: Positive for cough, shortness of breath and wheezing. Negative for sputum production.   Cardiovascular: Negative for chest pain and palpitations.  Gastrointestinal: Negative for abdominal pain and vomiting.  Genitourinary: Negative for dysuria and hematuria.  Musculoskeletal: Negative for arthralgias and back pain.  Skin: Negative for color change and rash.  Neurological: Negative for seizures and  syncope.  All other systems reviewed and are negative.    Physical Exam Updated Vital Signs  ED Triage Vitals [08/26/18 1602]  Enc Vitals Group     BP 127/62     Pulse Rate (!) 108     Resp (!) 22     Temp 98.6 F (37 C)     Temp src      SpO2 90 %     Weight      Height      Head Circumference      Peak Flow      Pain Score 0     Pain Loc      Pain Edu?      Excl. in [place]?     Physical Exam Vitals signs and nursing note reviewed.  Constitutional:      General: She is not in acute distress.    Appearance: She is well-developed. She is obese. She is not ill-appearing.  HENT:     Head: Normocephalic and atraumatic.     Mouth/Throat:     Mouth: Mucous membranes are moist.     Pharynx: Oropharynx is clear.  Eyes:     Extraocular Movements: Extraocular movements intact.     Conjunctiva/sclera: Conjunctivae normal.     Pupils: Pupils are equal, round, and reactive to light.  Neck:     Musculoskeletal: Normal range of motion and neck supple.  Cardiovascular:     Rate and Rhythm: Normal rate and regular rhythm.     Pulses: Normal pulses.     Heart sounds: Normal heart sounds. No murmur.  Pulmonary:     Effort: Tachypnea present. No respiratory distress.     Breath sounds: Decreased breath sounds and wheezing present. No rhonchi or rales.  Abdominal:      Palpations: Abdomen is soft.     Tenderness: There is no abdominal tenderness.  Musculoskeletal: Normal range of motion.     Right lower leg: No edema.     Left lower leg: No edema.  Skin:    General: Skin is warm and dry.     Capillary Refill: Capillary refill takes less than 2 seconds.  Neurological:     General: No focal deficit present.     Mental Status: She is alert.  Psychiatric:        Mood and Affect: Mood normal.      ED Treatments / Results  Labs (all labs ordered are listed, but only abnormal results are displayed) Labs Reviewed  CBC WITH DIFFERENTIAL/PLATELET - Abnormal; Notable for the following components:      Result Value   WBC 15.6 (*)    Hemoglobin 10.1 (*)    MCH 20.8 (*)    MCHC 25.3 (*)    RDW 19.9 (*)    Platelets 563 (*)    nRBC 2.5 (*)    Neutro Abs 13.3 (*)    Monocytes Absolute 1.2 (*)    Abs Immature Granulocytes 0.12 (*)    All other components within normal limits  BASIC METABOLIC PANEL - Abnormal; Notable for the following components:   Potassium 5.4 (*)    Glucose, Bld 416 (*)    BUN 25 (*)    Creatinine, Ser 1.31 (*)    GFR calc non Af Amer 41 (*)    GFR calc Af Amer 47 (*)    All other components within normal limits  BRAIN NATRIURETIC PEPTIDE - Abnormal; Notable for the following components:   B Natriuretic Peptide 374.2 (*)  All other components within normal limits  TROPONIN I (HIGH SENSITIVITY) - Abnormal; Notable for the following components:   Troponin I (High Sensitivity) 24 (*)    All other components within normal limits  SARS CORONAVIRUS 2 (HOSPITAL ORDER, [place])  BLOOD GAS, VENOUS    EKG EKG Interpretation  Date/Time:  Wednesday August 26 2018 15:57:32 EDT Ventricular Rate:  105 PR Interval:    QRS Duration: 120 QT Interval:  364 QTC Calculation: 482 R Axis:   59 Text Interpretation:  Sinus tachycardia Ventricular premature complex Confirmed by Lennice Sites [phone]) on  08/26/2018 4:12:44 PM   Radiology Dg Chest Port 1 View  Result Date: 08/26/2018 CLINICAL DATA:  Shortness of breath EXAM: PORTABLE CHEST 1 VIEW COMPARISON:  Radiograph 01/26/2018, chest CT 10/29/2016 FINDINGS: Increasing patchy opacity in the bilateral lung bases and retrocardiac space. The vascularity is indistinct and cephalized. The cardiac silhouette appears enlarged when compared to prior studies accounting for portable technique. Suspect bilateral effusions with obscuration of both hemidiaphragms. No visible pneumothorax. No acute osseous or soft tissue abnormality. Cardiac monitoring leads overlie the chest. IMPRESSION: Findings of heart failure/volume overload with cardiomegaly, edema and likely bilateral effusions. More confluent opacity could reflect alveolar edema versus infection. Electronically Signed   By: Lovena Le M.D.   On: 08/26/2018 17:18    Procedures .Critical Care Performed by: Lennice Sites, DO Authorized by: Lennice Sites, DO   Critical care provider statement:    Critical care time (minutes):  40   Critical care was necessary to treat or prevent imminent or life-threatening deterioration of the following conditions:  Respiratory failure   Critical care was time spent personally by me on the following activities:  Blood draw for specimens, development of treatment plan with patient or surrogate, discussions with primary provider, evaluation of patient's response to treatment, examination of patient, obtaining history from patient or surrogate, ordering and performing treatments and interventions, pulse oximetry, ordering and review of radiographic studies, ordering and review of laboratory studies, re-evaluation of patient's condition and review of old charts   I assumed direction of critical care for this patient from another provider in my specialty: no     (including critical care time)  Medications Ordered in ED Medications  ipratropium (ATROVENT HFA) inhaler 2  puff (2 puffs Inhalation Given 08/26/18 1730)  albuterol (VENTOLIN HFA) 108 (90 Base) MCG/ACT inhaler 10 puff (10 puffs Inhalation Given 08/26/18 1618)  AeroChamber Plus Flo-Vu Large MISC (1 each  Given 08/26/18 1621)  furosemide (LASIX) injection 40 mg (40 mg Intravenous Given 08/26/18 1809)     Initial Impression / Assessment and Plan / ED Course  I have reviewed the triage vital signs and the nursing notes.  Pertinent labs & imaging results that were available during my care of the patient were reviewed by me and considered in my medical decision making (see chart for details).     [name] is a 73 year old female with history of COPD on 5 L of oxygen, CHF, tobacco abuse who presents to the ED with shortness of breath.  Patient continues to smoke daily.  Patient on 10 L of oxygen to maintain oxygen saturations above 88%.  Increased oxygen demand likely in the setting of COPD exacerbation/heart failure exacerbation.  She does have edema in her legs.  Wheezing, diminished breath sounds throughout on exam.  Increased work of breathing.  Patient has not had any fevers.  Coronavirus test was negative.  Chest x-ray more consistent  with volume overload and infectious process.  Had a mild leukocytosis of 15.  Otherwise mild elevation in troponin and BNP likely from fluid.  EKG shows sinus rhythm.  No ischemic changes.  She is not having any specific chest pain.  Patient was given IV Lasix, albuterol, Atrovent.  Antibiotics were held as this seems more consistent with a COPD exacerbation/heart failure exacerbation.  Patient does not have any fever.  Patient placed on BiPAP for further respiratory support.  However, feels improved with increased oxygen.  Patient admitted to hospitalist service for acute on chronic respiratory failure.  This chart was dictated using voice recognition software.  Despite best efforts to proofread,  errors can occur which can change the documentation meaning.    Final Clinical  Impressions(s) / ED Diagnoses   Final diagnoses:  Acute respiratory failure with hypoxia (HCC)  COPD exacerbation (HCC)  Acute on chronic congestive heart failure, unspecified heart failure type [facility])    ED Discharge Orders    None       Lennice Sites, DO 08/26/18 1818

## 2018-08-26 NOTE — Telephone Encounter (Signed)
Triage  Pls call [name] and see if she is ok.  If needed app should to tele visit or virtual video visit  Thanks  'MR

## 2018-08-26 NOTE — ED Notes (Signed)
ED TO INPATIENT HANDOFF REPORT  ED Nurse Name and Phone #:  Levada Dy s #80 S Name/Age/Gender Millissa Difiore 73 y.o. female Room/Bed: 036C/036C  Code Status   Code Status: Full Code  Home/SNF/Other Home Patient oriented to: self, place, time and situation Is this baseline? Yes   Triage Complete: Triage complete  Chief Complaint SHOB/ recieving solumedrol  Triage Note Per pt ems was called out today for sob, no cough no fevers, no chills. Pt is a smoker and had ems called out last night and was given solumedrol and mag. Some relief and did not come to hospital. Today was given same thing from ems with no relief. Still having hard time breathing. Per pt she is on o2 at home of 4 L.On a non rebreather at  10L no stats at 58   Allergies Allergies  Allergen Reactions  . Ambien [Zolpidem Tartrate] Other (See Comments)    Causes nightmares    Level of Care/Admitting Diagnosis ED Disposition    ED Disposition Condition [facility]: [place] [[id number]]  Level of Care: Progressive [102]  Covid Evaluation: Confirmed COVID Negative  Diagnosis: Acute on chronic respiratory failure with hypoxia [facility]) [[id number]]  Admitting Physician: Lenore Cordia [[id number]]  Attending Physician: Lenore Cordia [[id number]]  Estimated length of stay: past midnight tomorrow  Certification:: I certify this patient will need inpatient services for at least 2 midnights  PT Class (Do Not Modify): Inpatient [101]  PT Acc Code (Do Not Modify): Private [1]       B Medical/Surgery History Past Medical History:  Diagnosis Date  . Arrhythmia   . Bronchiectasis march 2012   On CT chest. RUL. Mild  . CHF (congestive heart failure) ([place])   . Chicken pox   . Chronic diastolic heart failure ([place])   . Chronic respiratory failure ([place])    Followed in Pulmonary clinic/ [facility]/ Ramaswamy  - 06/30/2012 desat to 86%  RA walking 50 ft, recovered to 90% at rest -  06/30/2012  Walked 1lpm x 3 laps @ 185 ft each stopped due to  Sob, no desat  rec 02 2lpm with activity and sleeping, ok at rest   . COPD (chronic obstructive pulmonary disease) ([place])     FEV-1 in 2008 was 63% with a diffusion capacity of 33%.   . Generalized anxiety disorder   . History of cervical cancer 1982  . Hyperlipidemia   . Hypertension   . Leg swelling    Venous doppler right 07/21/12 >>Neg    . 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 Medical non-compliance   . MITRAL VALVE PROLAPSE   . Obesity, unspecified   . Pulmonary nodule 03/2010   57mm RUL and RLL 1st seen march 2012 CT, ?progression 12/2012 CT  . Seasonal allergies   . Tobacco abuse     Smokes one pack a day since age 31.   Past Surgical History:  Procedure Laterality Date  . TOTAL ABDOMINAL HYSTERECTOMY       A IV Location/Drains/Wounds Patient Lines/Drains/Airways Status   Active Line/Drains/Airways    None          Intake/Output Last 24 hours No intake or output data in the 24 hours ending 08/26/18 2004  Labs/Imaging Results for orders placed or performed during the hospital encounter of 08/26/18 (from the past 48 hour(s))  SARS Coronavirus 2 [facility] order, Performed in [facility]  hospital lab) Nasopharyngeal Nasopharyngeal Swab     Status: None   Collection Time: 08/26/18  4:24 PM   Specimen: Nasopharyngeal Swab  Result Value Ref Range   SARS Coronavirus 2 NEGATIVE NEGATIVE    Comment: (NOTE) If result is NEGATIVE SARS-CoV-2 target nucleic acids are NOT DETECTED. The SARS-CoV-2 RNA is generally detectable in upper and lower  respiratory specimens during the acute phase of infection. The lowest  concentration of SARS-CoV-2 viral copies this assay can detect is 250  copies / mL. A negative result does not preclude SARS-CoV-2 infection  and should not be used as the sole basis for treatment or other  patient management decisions.  A negative result may occur  with  improper specimen collection / handling, submission of specimen other  than nasopharyngeal swab, presence of viral mutation(s) within the  areas targeted by this assay, and inadequate number of viral copies  (<250 copies / mL). A negative result must be combined with clinical  observations, patient history, and epidemiological information. If result is POSITIVE SARS-CoV-2 target nucleic acids are DETECTED. The SARS-CoV-2 RNA is generally detectable in upper and lower  respiratory specimens dur ing the acute phase of infection.  Positive  results are indicative of active infection with SARS-CoV-2.  Clinical  correlation with patient history and other diagnostic information is  necessary to determine patient infection status.  Positive results do  not rule out bacterial infection or co-infection with other viruses. If result is PRESUMPTIVE POSTIVE SARS-CoV-2 nucleic acids MAY BE PRESENT.   A presumptive positive result was obtained on the submitted specimen  and confirmed on repeat testing.  While 2019 novel coronavirus  (SARS-CoV-2) nucleic acids may be present in the submitted sample  additional confirmatory testing may be necessary for epidemiological  and / or clinical management purposes  to differentiate between  SARS-CoV-2 and other Sarbecovirus currently known to infect humans.  If clinically indicated additional testing with an alternate test  methodology [phone]) is advised. The SARS-CoV-2 RNA is generally  detectable in upper and lower respiratory sp ecimens during the acute  phase of infection. The expected result is Negative. Fact Sheet for Patients:  StrictlyIdeas.no Fact Sheet for Healthcare Providers: BankingDealers.co.za This test is not yet approved or cleared by the [place] FDA and has been authorized for detection and/or diagnosis of SARS-CoV-2 by FDA under an Emergency Use Authorization (EUA).  This EUA  will remain in effect (meaning this test can be used) for the duration of the COVID-19 declaration under Section 564(b)(1) of the Act, 21 U.S.C. section 360bbb-3(b)(1), unless the authorization is terminated or revoked sooner. Performed at [facility], [place] [address], [place], [place] [address]   CBC with Differential     Status: Abnormal   Collection Time: 08/26/18  4:33 PM  Result Value Ref Range   WBC 15.6 (H) 4.0 - 10.5 K/uL   RBC 4.86 3.87 - 5.11 MIL/uL   Hemoglobin 10.1 (L) 12.0 - 15.0 g/dL   HCT 40.0 36.0 - 46.0 %   MCV 82.3 80.0 - 100.0 fL   MCH 20.8 (L) 26.0 - 34.0 pg   MCHC 25.3 (L) 30.0 - 36.0 g/dL   RDW 19.9 (H) 11.5 - 15.5 %   Platelets 563 (H) 150 - 400 K/uL   nRBC 2.5 (H) 0.0 - 0.2 %   Neutrophils Relative % 84 %   Neutro Abs 13.3 (H) 1.7 - 7.7 K/uL   Lymphocytes Relative 7 %   Lymphs Abs 1.1  0.7 - 4.0 K/uL   Monocytes Relative 8 %   Monocytes Absolute 1.2 (H) 0.1 - 1.0 K/uL   Eosinophils Relative 0 %   Eosinophils Absolute 0.0 0.0 - 0.5 K/uL   Basophils Relative 0 %   Basophils Absolute 0.0 0.0 - 0.1 K/uL   Immature Granulocytes 1 %   Abs Immature Granulocytes 0.12 (H) 0.00 - 0.07 K/uL    Comment: Performed at [facility] [address] [place], [place] [address]  Basic metabolic panel     Status: Abnormal   Collection Time: 08/26/18  4:33 PM  Result Value Ref Range   Sodium 138 135 - 145 mmol/L   Potassium 5.4 (H) 3.5 - 5.1 mmol/L   Chloride 104 98 - 111 mmol/L   CO2 24 22 - 32 mmol/L   Glucose, Bld 416 (H) 70 - 99 mg/dL   BUN 25 (H) 8 - 23 mg/dL   Creatinine, Ser 1.31 (H) 0.44 - 1.00 mg/dL   Calcium 9.0 8.9 - 10.3 mg/dL   GFR calc non Af Amer 41 (L) >60 mL/min   GFR calc Af Amer 47 (L) >60 mL/min   Anion gap 10 5 - 15    Comment: Performed at [facility] [address], [place], [place] [address]  Troponin I (High Sensitivity)     Status: Abnormal   Collection Time: 08/26/18  4:33 PM  Result Value Ref Range   Troponin I  (High Sensitivity) 24 (H) <18 ng/L    Comment: (NOTE) Elevated high sensitivity troponin I (hsTnI) values and significant  changes across serial measurements may suggest ACS but many other  chronic and acute conditions are known to elevate hsTnI results.  Refer to the "Links" section for chest pain algorithms and additional  guidance. Performed at [facility], [place] [address] [place], [place] [address]   Brain natriuretic peptide     Status: Abnormal   Collection Time: 08/26/18  4:33 PM  Result Value Ref Range   B Natriuretic Peptide 374.2 (H) 0.0 - 100.0 pg/mL    Comment: Performed at [facility] [address] [place], [place] [address]  I-STAT 7, (LYTES, BLD GAS, ICA, H+H)     Status: Abnormal   Collection Time: 08/26/18  6:46 PM  Result Value Ref Range   pH, Arterial 7.237 (L) 7.350 - 7.450   pCO2 arterial 72.5 (HH) 32.0 - 48.0 mmHg   pO2, Arterial 70.0 (L) 83.0 - 108.0 mmHg   Bicarbonate 30.9 (H) 20.0 - 28.0 mmol/L   TCO2 33 (H) 22 - 32 mmol/L   O2 Saturation 90.0 %   Acid-Base Excess 2.0 0.0 - 2.0 mmol/L   Sodium 139 135 - 145 mmol/L   Potassium 5.6 (H) 3.5 - 5.1 mmol/L   Calcium, Ion 1.31 1.15 - 1.40 mmol/L   HCT 36.0 36.0 - 46.0 %   Hemoglobin 12.2 12.0 - 15.0 g/dL   Patient temperature HIDE    Collection site RADIAL, ALLEN'S TEST ACCEPTABLE    Drawn by RT    Sample type ARTERIAL    Comment NOTIFIED PHYSICIAN    Dg Chest Port 1 View  Result Date: 08/26/2018 CLINICAL DATA:  Shortness of breath EXAM: PORTABLE CHEST 1 VIEW COMPARISON:  Radiograph 01/26/2018, chest CT 10/29/2016 FINDINGS: Increasing patchy opacity in the bilateral lung bases and retrocardiac space. The vascularity is indistinct and cephalized. The cardiac silhouette appears enlarged when compared to prior studies accounting for portable technique. Suspect bilateral  effusions with obscuration of both hemidiaphragms. No visible pneumothorax. No acute osseous or soft tissue abnormality. Cardiac  monitoring leads overlie the chest. IMPRESSION: Findings of heart failure/volume overload with cardiomegaly, edema and likely bilateral effusions. More confluent opacity could reflect alveolar edema versus infection. Electronically Signed   By: Lovena Le M.D.   On: 08/26/2018 17:18    Pending Labs Unresulted Labs (From admission, onward)    Start     Ordered   08/27/18 0500  CBC  Tomorrow morning,   R     08/26/18 1919   08/27/18 4403  Basic metabolic panel  Tomorrow morning,   R     08/26/18 1919   08/26/18 1923  Hemoglobin A1c  Once,   STAT    Comments: To assess prior glycemic control    08/26/18 1922   08/26/18 1845  Blood gas, arterial  Once,   R     08/26/18 1824          Vitals/Pain Today's Vitals   08/26/18 1808 08/26/18 1830 08/26/18 1845 08/26/18 1916  BP: 123/69 123/81 129/86   Pulse: (!) 105 99 100 (!) 105  Resp: (!) 22 16 17  (!) 29  Temp:      SpO2: 95% 95% 97% 96%  PainSc:        Isolation Precautions No active isolations  Medications Medications  heparin injection 5,000 Units (has no administration in time range)  sodium chloride flush (NS) 0.9 % injection 3 mL (has no administration in time range)  acetaminophen (TYLENOL) tablet 650 mg (has no administration in time range)    Or  acetaminophen (TYLENOL) suppository 650 mg (has no administration in time range)  senna-docusate (Senokot-S) tablet 1 tablet (has no administration in time range)  albuterol (PROVENTIL) (2.5 MG/3ML) 0.083% nebulizer solution 2.5 mg (has no administration in time range)  ipratropium-albuterol (DUONEB) 0.5-2.5 (3) MG/3ML nebulizer solution 3 mL (has no administration in time range)  furosemide (LASIX) injection 40 mg (has no administration in time range)  methylPREDNISolone sodium succinate (SOLU-MEDROL) 40 mg/mL injection 40 mg (has no administration in time range)  sodium zirconium cyclosilicate (LOKELMA) packet 10 g (0 g Oral Hold 08/26/18 1926)  insulin glargine (LANTUS)  injection 22 Units (has no administration in time range)  insulin aspart (novoLOG) injection 0-20 Units (has no administration in time range)  insulin aspart (novoLOG) injection 0-5 Units (has no administration in time range)  ALPRAZolam (XANAX) tablet 0.5 mg (has no administration in time range)  Fluticasone-Umeclidin-Vilant 100-62.5-25 MCG/INH AEPB 1 puff (has no administration in time range)  pantoprazole (PROTONIX) EC tablet 40 mg (0 mg Oral Hold 08/26/18 1927)  PARoxetine (PAXIL) tablet 40 mg (has no administration in time range)  ipratropium (ATROVENT HFA) inhaler 2 puff (2 puffs Inhalation Given 08/26/18 1730)  albuterol (VENTOLIN HFA) 108 (90 Base) MCG/ACT inhaler 10 puff (10 puffs Inhalation Given 08/26/18 1618)  AeroChamber Plus Flo-Vu Large MISC (1 each  Given 08/26/18 1621)  furosemide (LASIX) injection 40 mg (40 mg Intravenous Given 08/26/18 1809)    Mobility walks Low fall risk   Focused Assessments Pulmonary Assessment Handoff:  Lung sounds: Bilateral Breath Sounds: Diminished L Breath Sounds: Diminished R Breath Sounds: Diminished O2 Device: (S) Bi-PAP O2 Flow Rate (L/min): 10 L/min      R Recommendations: See Admitting Provider Note  Report given to:   Additional Notes: Pt on BIPAP, HX SMOKER

## 2018-08-26 NOTE — H&P (Signed)
History and Physical    [name] YPP:[id number] DOB: 1945/04/16 DOA: 08/26/2018  PCP: Binnie Rail, MD  Patient coming from: Home via EMS  I have personally briefly reviewed patient's old medical records in [facility]  Chief Complaint: Dyspnea  HPI: [name] is a 73 y.o. female with medical history significant for COPD with chronic respiratory failure on 5 L O2 via [place] and chronic prednisone therapy, chronic diastolic CHF, insulin-dependent diabetes, hypertension, hyperlipidemia, anxiety/depression, and tobacco use who presents to the ED for evaluation of worsening dyspnea.  Patient reports worsening dyspnea over the last 1-2 days and become short of breath with minimal activity.  She has not had any associated cough.  She had a telemedicine visit with pulmonology day prior to admission on 08/25/2018 who recommended patient call EMS for evaluation in the ED due to worsening symptoms and increased O2 requirement from baseline 5 L O2 via [place] to 7 L O2.  Patient did not call EMS until today.  She denies any associated chest pain, abdominal pain, dysuria, or peripheral edema.  Patient was given Solu-Medrol and magnesium on route to the ED via EMS.  Of note patient also had a virtual visit with palliative care on day of admission 08/26/2018) documentation remains full code without advanced directives in place.  ED Course:  Initial vitals showed BP 127/62, pulse 93, RR 21, temp 98.6 Fahrenheit, SPO2 92% on NRB at 10 L O2.  Labs normal for WBC 15.6, hemoglobin 10.1, platelets 563,000, sodium 138, potassium 5.4, bicarb 24, BUN 25, creatinine 1.31, serum glucose 416, BNP 374.2, high-sensitivity troponin I 24.  ABG showed pH 7.237, PCO2 72.5, PO2 70%.  SARS-CoV-2 test was negative.  Portable chest x-ray showed enlarged cardiac silhouette with likely pulmonary edema and bilateral pleural effusions.  Patient was given IV Lasix 40 mg once, albuterol and Atrovent inhaler treatments, and placed on  BiPAP.  The hospitalist service was consulted to admit for further evaluation and management.  Review of Systems: All systems reviewed and are negative except as documented in history of present illness above.   Past Medical History:  Diagnosis Date  . Arrhythmia   . Bronchiectasis march 2012   On CT chest. RUL. Mild  . CHF (congestive heart failure) ([place])   . Chicken pox   . Chronic diastolic heart failure ([place])   . Chronic respiratory failure ([place])    Followed in Pulmonary clinic/ [facility]/ Ramaswamy  - 06/30/2012 desat to 86%  RA walking 50 ft, recovered to 90% at rest - 06/30/2012  Walked 1lpm x 3 laps @ 185 ft each stopped due to  Sob, no desat  rec 02 2lpm with activity and sleeping, ok at rest   . COPD (chronic obstructive pulmonary disease) ([place])     FEV-1 in 2008 was 63% with a diffusion capacity of 33%.   . Generalized anxiety disorder   . History of cervical cancer 1982  . Hyperlipidemia   . Hypertension   . Leg swelling    Venous doppler right 07/21/12 >>Neg    . 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 Medical non-compliance   . MITRAL VALVE PROLAPSE   . Obesity, unspecified   . Pulmonary nodule 03/2010   5mm RUL and RLL 1st seen march 2012 CT, ?progression 12/2012 CT  . Seasonal allergies   . Tobacco abuse     Smokes one pack a day since age 53.  Past Surgical History:  Procedure Laterality Date  . TOTAL ABDOMINAL HYSTERECTOMY      Social History:  reports that she has been smoking cigarettes. She has a 12.50 pack-year smoking history. She has never used smokeless tobacco. She reports current alcohol use. She reports that she does not use drugs.  Allergies  Allergen Reactions  . Ambien [Zolpidem Tartrate] Other (See Comments)    Causes nightmares    Family History  Problem Relation Age of Onset  . Other Father        MVA  . Esophageal cancer Mother      Prior to Admission medications   Medication Sig  Start Date End Date Taking? Authorizing Provider  albuterol (PROVENTIL HFA;VENTOLIN HFA) 108 (90 Base) MCG/ACT inhaler Inhale 2 puffs into the lungs every 6 (six) hours as needed for wheezing or shortness of breath. 12/24/17   Brand Males, MD  albuterol (PROVENTIL) (2.5 MG/3ML) 0.083% nebulizer solution Take 2.5 mg by nebulization every 6 (six) hours as needed for wheezing or shortness of breath.    [provider]  ALPRAZolam (XANAX) 0.5 MG tablet TAKE 1 TABLET BY MOUTH THREE TIMES A DAY AS NEEDED FOR ANXIETY 08/20/18   Burns, Claudina Lick, MD  fluticasone (FLONASE) 50 MCG/ACT nasal spray USE 2 SPRAYS IN EACH NOSTRIL DAILY AS NEEDED 08/17/18   Binnie Rail, MD  Fluticasone-Umeclidin-Vilant (TRELEGY ELLIPTA) 100-62.5-25 MCG/INH AEPB Inhale 1 puff into the lungs daily. 07/16/18   Brand Males, MD  furosemide (LASIX) 40 MG tablet Take 40 mg by mouth daily.     [provider]  GLUCOCOM LANCETS 33G MISC Use with Test Strips to take blood sugars 07/20/15   Burns, Claudina Lick, MD  glucose blood (CONTOUR NEXT TEST) test strip USE TO TAKE BLOOD SUGARS TWICE DAILY. 12/24/17   Binnie Rail, MD  Insulin Glargine (LANTUS SOLOSTAR) 100 UNIT/ML Solostar Pen INJECT 32 UNITS INTO THE SKIN DAILY AT 10 PM. Patient taking differently: INJECT 22 UNITS INTO THE SKIN DAILY AT 10 PM. 02/17/18   Burns, Claudina Lick, MD  Insulin Pen Needle (BD PEN NEEDLE NANO U/F) 32G X 4 MM MISC USE TO ADMINISTER LANTUS INSULIN 05/08/16   Binnie Rail, MD  nystatin (MYCOSTATIN) 100000 UNIT/ML suspension Take 5 mLs (500,000 Units total) by mouth 4 (four) times daily. 07/29/18   Parrett, Fonnie Mu, NP  pantoprazole (PROTONIX) 40 MG tablet Take 1 tablet (40 mg total) by mouth daily. 01/26/18   Binnie Rail, MD  PARoxetine (PAXIL) 40 MG tablet TAKE 1 TABLET BY MOUTH EVERY DAY IN THE MORNING. 08/20/18   Burns, Claudina Lick, MD  predniSONE (DELTASONE) 20 MG tablet Take 1 tablet (20 mg total) by mouth daily with breakfast. 07/29/18   Parrett,  Fonnie Mu, NP  triamcinolone cream (KENALOG) 0.1 % Apply 1 application topically 2 (two) times daily. 07/15/17   Binnie Rail, MD    Physical Exam: Vitals:   08/26/18 1715 08/26/18 1745 08/26/18 1808 08/26/18 1830  BP: (!) 124/96 131/87 123/69 123/81  Pulse:  (!) 112 (!) 105 99  Resp: 18 (!) 25 (!) 22 16  Temp:      SpO2:  92% 95% 95%    Constitutional: Obese woman resting in bed with head elevated on BiPAP, appears tired but in NAD, calm, comfortable Eyes: PERRL, lids and conjunctivae normal ENMT: Mucous membranes are moist.  On BiPAP. Neck: normal, supple, no masses. Respiratory: Bibasilar inspiratory crackles with wheezing bilaterally while on BiPAP.  No accessory  muscle use.  Cardiovascular: Regular rate and rhythm, no murmurs / rubs / gallops.  Trace bilateral lower extremity edema. 2+ pedal pulses. Abdomen: no tenderness, no masses palpated. No hepatosplenomegaly. Bowel sounds positive.  Musculoskeletal: no clubbing / cyanosis. No joint deformity upper and lower extremities. Good ROM, no contractures. Normal muscle tone.  Skin: no rashes, lesions, ulcers. No induration Neurologic: CN 2-12 grossly intact. Sensation intact, Strength 5/5 in all 4.  Psychiatric: Alert and oriented x 3. Normal mood.     Labs on Admission: I have personally reviewed following labs and imaging studies  CBC: Recent Labs  Lab 08/26/18 1633  WBC 15.6*  NEUTROABS 13.3*  HGB 10.1*  HCT 40.0  MCV 82.3  PLT 937*   Basic Metabolic Panel: Recent Labs  Lab 08/26/18 1633  NA 138  K 5.4*  CL 104  CO2 24  GLUCOSE 416*  BUN 25*  CREATININE 1.31*  CALCIUM 9.0   GFR: CrCl cannot be calculated (Unknown ideal weight.). Liver Function Tests: No results for input(s): AST, ALT, ALKPHOS, BILITOT, PROT, ALBUMIN in the last 168 hours. No results for input(s): LIPASE, AMYLASE in the last 168 hours. No results for input(s): AMMONIA in the last 168 hours. Coagulation Profile: No results for input(s):  INR, PROTIME in the last 168 hours. Cardiac Enzymes: No results for input(s): CKTOTAL, CKMB, CKMBINDEX, TROPONINI in the last 168 hours. BNP (last 3 results) No results for input(s): PROBNP in the last 8760 hours. HbA1C: No results for input(s): HGBA1C in the last 72 hours. CBG: No results for input(s): GLUCAP in the last 168 hours. Lipid Profile: No results for input(s): CHOL, HDL, LDLCALC, TRIG, CHOLHDL, LDLDIRECT in the last 72 hours. Thyroid Function Tests: No results for input(s): TSH, T4TOTAL, FREET4, T3FREE, THYROIDAB in the last 72 hours. Anemia Panel: No results for input(s): VITAMINB12, FOLATE, FERRITIN, TIBC, IRON, RETICCTPCT in the last 72 hours. Urine analysis:    Component Value Date/Time   COLORURINE YELLOW 07/04/2015 1848   APPEARANCEUR CLOUDY (A) 07/04/2015 1848   LABSPEC 1.040 (H) 07/04/2015 1848   PHURINE 6.0 07/04/2015 1848   GLUCOSEU >1000 (A) 07/04/2015 1848   GLUCOSEU NEGATIVE 04/09/2006 1315   HGBUR NEGATIVE 07/04/2015 1848   BILIRUBINUR NEGATIVE 07/04/2015 1848   KETONESUR NEGATIVE 07/04/2015 1848   PROTEINUR NEGATIVE 07/04/2015 1848   UROBILINOGEN 1.0 04/04/2010 2323   NITRITE NEGATIVE 07/04/2015 1848   LEUKOCYTESUR NEGATIVE 07/04/2015 1848    Radiological Exams on Admission: Dg Chest Port 1 View  Result Date: 08/26/2018 CLINICAL DATA:  Shortness of breath EXAM: PORTABLE CHEST 1 VIEW COMPARISON:  Radiograph 01/26/2018, chest CT 10/29/2016 FINDINGS: Increasing patchy opacity in the bilateral lung bases and retrocardiac space. The vascularity is indistinct and cephalized. The cardiac silhouette appears enlarged when compared to prior studies accounting for portable technique. Suspect bilateral effusions with obscuration of both hemidiaphragms. No visible pneumothorax. No acute osseous or soft tissue abnormality. Cardiac monitoring leads overlie the chest. IMPRESSION: Findings of heart failure/volume overload with cardiomegaly, edema and likely bilateral  effusions. More confluent opacity could reflect alveolar edema versus infection. Electronically Signed   By: Lovena Le M.D.   On: 08/26/2018 17:18    EKG: Independently reviewed.  Sinus tachycardia with PVCs, similar to prior..  Assessment/Plan Principal Problem:   Acute on chronic respiratory failure with hypoxia (HCC) Active Problems:   Hyperlipidemia   Anxiety   COPD (chronic obstructive pulmonary disease) (HCC)   Acute on chronic diastolic CHF (congestive heart failure) ([place])   Diabetes ([place])  Depression   Hyperkalemia  [name] is a 73 y.o. female with medical history significant for COPD with chronic respiratory failure on 5 L O2 via [place] and chronic prednisone therapy, chronic diastolic CHF, insulin-dependent diabetes, hypertension, hyperlipidemia, anxiety/depression, and tobacco use who is admitted with acute on chronic respiratory failure with hypoxia.   Acute on chronic respiratory failure with hypoxia and hypercapnia: Likely mixed etiology from acute on chronic COPD and diastolic CHF exacerbations.  Chest x-ray also showing bilateral pleural effusions. -Admit to progressive care unit, use BiPAP as needed -Continue IV Lasix 40 mg twice daily, monitor strict I/O's and daily weights -Continue IV Solu-Medrol 40 mg twice daily for now -Continue scheduled DuoNeb and as needed albuterol nebulizers -Wean to home 5 L supplemental O2, maintain O2 sats between 88-92% -SARS-CoV-2 is negative  Acute on chronic diastolic CHF: -Continue IV Lasix 40 mg twice daily, monitor strict I/O's and daily weights -Update echocardiogram -BiPAP as needed  Acute on chronic COPD with chronic respiratory failure: -Continue IV Solu-Medrol 40 mg twice daily for now -Continue scheduled DuoNeb and as needed albuterol nebulizers -Wean to home 5 L supplemental O2 as able, maintain O2 sats between 88-92% -BiPAP as needed  Hyperkalemia: K 5.4 on admission.  Will give Lokelma x1.  Anticipate  further correction with insulin, nebulizer treatments, and Lasix.  Insulin-dependent diabetes with hyperglycemia: Last hemoglobin A1c 6.5% on 07/01/2017. -Continue home Lantus 22 units nightly -Add resistant SSI plus at bedtime coverage  Hyperlipidemia: Does not appear to be on statin therapy.  Consider addition in setting of diabetes and per patient preference.  GERD: Continue Protonix.  Anxiety/depression: Continue home paroxetine and Xanax 0.5 mg 3 times daily only as needed with hold parameters.  DVT prophylaxis: Subcutaneous heparin Code Status: Full code, confirmed with patient Family Communication: None present on admission. Disposition Plan: Pending clinical progress Consults called: None Admission status: Admit - It is my clinical opinion that admission to INPATIENT is reasonable and necessary because of the expectation that this patient will require hospital care that crosses at least 2 midnights to treat this condition based on the medical complexity of the problems presented.  Given the aforementioned information, the predictability of an adverse outcome is felt to be significant.      Zada Finders MD [facility]  If 7PM-7AM, please contact night-coverage [email or web address]  08/26/2018, 6:37 PM

## 2018-08-26 NOTE — ED Notes (Signed)
Pt was taken off Bipap per threrapist and orders. Pt is on 6L nasal canula. Pt is at 92 %

## 2018-08-26 NOTE — Progress Notes (Signed)
COMMUNITY PALLIATIVE CARE RN NOTE  PATIENT NAME: [name] DOB: 10/12/1945 MRN: [id number]  PRIMARY CARE PROVIDER: Binnie Rail, MD  RESPONSIBLE PARTY:  Acct ID - Guarantor Home Phone Work Phone Relationship Acct Type  [id number] [facility] [phone]  Self P/F     [address] [place], [place], [place] [address]   Due to the COVID-19 crisis, this virtual check-in visit was done via telephone from my office and it was initiated and consent by this patient and or family.  PLAN OF CARE and INTERVENTION:  1. ADVANCE CARE PLANNING/GOALS OF CARE: Goal is for patient to remain in her home and breathing to improve. 2. PATIENT/CAREGIVER EDUCATION: Respiratory disease progression 3. DISEASE STATUS: Virtual check-in visit completed via telephone. Spoke with patient who is still at home. She was instructed yesterday by her NP, Tammy Parret, with [facility] that she needs to be evaluated in the ED for worsening shortness of breath. Patient advised me yesterday that she would go today. She states that she did end up having to call EMS last pm because she was unable to get her oxygen levels to come up. She says the paramedics gave her Magnesium, a steroid injection and 4 nebulizer treatments and her sats came up to the 90s. They tried to get her to let them take her to the hospital last night, but she says she started feeling better after their interventions and decided against it. I encouraged patient to still go to the ED today. Dyspnea noted during conversation today as she had to take a breath between every 3-4 words. She remains on oxygen 7L/min via [place]. She says that she has an appointment with the Physical Therapist today at 2:00 pm, and if PT feels that she needs to go, then she will go. She wants to see if she would be able to work with PT, which she has not been able to do much of lately due to her oxygen levels dropping. Will continue to monitor.   HISTORY OF PRESENT ILLNESS: This is a 73  yo female who resides at home alone. Palliative care team continues to follow patient. Will continue to check in/visit with patient weekly and PRN.   CODE STATUS: Full Code ADVANCED DIRECTIVES: N MOST FORM: yes PPS: 50%   (Duration of visit and documentation 30 minutes)    Daryl Eastern, RN BSN

## 2018-08-26 NOTE — ED Triage Notes (Signed)
Per pt ems was called out today for sob, no cough no fevers, no chills. Pt is a smoker and had ems called out last night and was given solumedrol and mag. Some relief and did not come to hospital. Today was given same thing from ems with no relief. Still having hard time breathing. Per pt she is on o2 at home of 4 L.On a non rebreather at  10L no stats at 26

## 2018-08-26 NOTE — Progress Notes (Signed)
CRITICAL VALUE ALERT  Critical Value:  CBG: 444  Date & Time Notied:  08/26/2018 @2138   Provider Notified: Lurlean Leyden of [facility]  Orders Received/Actions taken: Additional Correction Insulin ordered.

## 2018-08-27 ENCOUNTER — Inpatient Hospital Stay (HOSPITAL_COMMUNITY): Payer: Medicare Other

## 2018-08-27 ENCOUNTER — Other Ambulatory Visit: Payer: Self-pay | Admitting: *Deleted

## 2018-08-27 DIAGNOSIS — J9621 Acute and chronic respiratory failure with hypoxia: Principal | ICD-10-CM

## 2018-08-27 DIAGNOSIS — I5031 Acute diastolic (congestive) heart failure: Secondary | ICD-10-CM

## 2018-08-27 LAB — CBC
HCT: 41.4 % (ref 36.0–46.0)
Hemoglobin: 9.6 g/dL — ABNORMAL LOW (ref 12.0–15.0)
MCH: 20.8 pg — ABNORMAL LOW (ref 26.0–34.0)
MCHC: 23.2 g/dL — ABNORMAL LOW (ref 30.0–36.0)
MCV: 89.8 fL (ref 80.0–100.0)
Platelets: 525 10*3/uL — ABNORMAL HIGH (ref 150–400)
RBC: 4.61 MIL/uL (ref 3.87–5.11)
RDW: 20.1 % — ABNORMAL HIGH (ref 11.5–15.5)
WBC: 18 10*3/uL — ABNORMAL HIGH (ref 4.0–10.5)
nRBC: 2.6 % — ABNORMAL HIGH (ref 0.0–0.2)

## 2018-08-27 LAB — BASIC METABOLIC PANEL
Anion gap: 13 (ref 5–15)
BUN: 27 mg/dL — ABNORMAL HIGH (ref 8–23)
CO2: 29 mmol/L (ref 22–32)
Calcium: 8.9 mg/dL (ref 8.9–10.3)
Chloride: 100 mmol/L (ref 98–111)
Creatinine, Ser: 1.37 mg/dL — ABNORMAL HIGH (ref 0.44–1.00)
GFR calc Af Amer: 45 mL/min — ABNORMAL LOW (ref >60)
GFR calc non Af Amer: 39 mL/min — ABNORMAL LOW (ref >60)
Glucose, Bld: 367 mg/dL — ABNORMAL HIGH (ref 70–99)
Potassium: 5 mmol/L (ref 3.5–5.1)
Sodium: 142 mmol/L (ref 135–145)

## 2018-08-27 LAB — GLUCOSE, CAPILLARY
Glucose-Capillary: 313 mg/dL — ABNORMAL HIGH (ref 70–99)
Glucose-Capillary: 308 mg/dL — ABNORMAL HIGH (ref 70–99)
Glucose-Capillary: 218 mg/dL — ABNORMAL HIGH (ref 70–99)
Glucose-Capillary: 216 mg/dL — ABNORMAL HIGH (ref 70–99)

## 2018-08-27 LAB — BLOOD GAS, ARTERIAL
Acid-Base Excess: 6.1 mmol/L — ABNORMAL HIGH (ref 0.0–2.0)
Bicarbonate: 32.1 mmol/L — ABNORMAL HIGH (ref 20.0–28.0)
Delivery systems: POSITIVE
Drawn by: 270271
Expiratory PAP: 6
FIO2: 50
Inspiratory PAP: 14
O2 Saturation: 90.9 %
Patient temperature: 98.7
pCO2 arterial: 65.8 mmHg (ref 32.0–48.0)
pH, Arterial: 7.31 — ABNORMAL LOW (ref 7.350–7.450)
pO2, Arterial: 70.1 mmHg — ABNORMAL LOW (ref 83.0–108.0)

## 2018-08-27 LAB — ECHOCARDIOGRAM COMPLETE
Height: 63 in
Weight: 3703.73 oz

## 2018-08-27 MED ORDER — ORAL CARE MOUTH RINSE: 15.0000 mL | Freq: Two times a day (BID) | OROMUCOSAL | Status: DC

## 2018-08-27 MED ORDER — PREDNISONE 20 MG PO TABS
20.0000 mg | ORAL_TABLET | Freq: Every day | ORAL | Status: DC
  Administered 2018-08-31 – 2018-09-05 (×6): 20 mg via ORAL
  Filled 2018-08-27 (×7): qty 1

## 2018-08-27 MED ORDER — NYSTATIN 100000 UNIT/ML MT SUSP
5.0000 mL | Freq: Four times a day (QID) | OROMUCOSAL | Status: DC
  Administered 2018-08-27 – 2018-08-30 (×13): 500000 [IU] via ORAL
  Filled 2018-08-27 (×13): qty 5

## 2018-08-27 MED ORDER — INSULIN GLARGINE 100 UNIT/ML ~~LOC~~ SOLN
40.0000 [IU] | Freq: Every day | SUBCUTANEOUS | Status: DC
  Administered 2018-08-27 – 2018-09-04 (×9): 40 [IU] via SUBCUTANEOUS
  Filled 2018-08-27 (×10): qty 0.4

## 2018-08-27 MED ORDER — CHLORHEXIDINE GLUCONATE 0.12 % MT SOLN: 15.0000 mL | Freq: Two times a day (BID) | OROMUCOSAL | Status: DC

## 2018-08-27 MED ORDER — ORAL CARE MOUTH RINSE
15.0000 mL | Freq: Two times a day (BID) | OROMUCOSAL | Status: DC
  Administered 2018-08-27 – 2018-09-03 (×13): 15 mL via OROMUCOSAL

## 2018-08-27 MED ORDER — PREDNISONE 20 MG PO TABS
40.0000 mg | ORAL_TABLET | Freq: Every day | ORAL | Status: AC
  Administered 2018-08-28 – 2018-08-30 (×3): 40 mg via ORAL
  Filled 2018-08-27 (×3): qty 2

## 2018-08-27 NOTE — Consult Note (Addendum)
NAMEMariona [name], MRN:  [id number], DOB:  1945-10-04, LOS: 1 ADMISSION DATE:  08/26/2018, CONSULTATION DATE:  08/27/2018 REFERRING MD:  Dr. Barb Merino, MD, CHIEF COMPLAINT:  Acute on chronic hypoxic respiratory failure   Brief History   73 year old female with a chronic lung disease who presented to the ED with dyspnea  HPI  73 year old female with a history of COPD on 7L of oxygen, oxygen dependent, chronic diastolic HF, and active tobacco use who presented to the ED with dyspnea.  Patient has end-stage lung disease. Over the course of the last 6 months, patient has been treated episodically for being volume overloaded. In the last 6 weeks, her symptoms have gotten progressively worse as a consequence of her advanced lung disease. Her lung disease has not improved despite medical treatment. She is being followed by palliative care. Palliative care has recommended she change to hospice however she continues on palliative care. She remains a full code without advanced directives in place.  During this admission, she reports fatigue, worsening dyspnea with minimal activity, and O2 saturation of 74-88% on 7L [place]. She denies any fever, productive cough, chest discomfort, palpitations, orthopnea, or increased lower extremity edema. Two days ago, she had a telemedicine visit with pulmonology, who recommended patient call EMS for her worsening dyspnea and increased O2 requirement evaluation. Patient delayed the call to EMS for 24 hours. PCCM consulted for acute on chronic hypoxic respiratory failure.  Past Medical History  Chronic hypoxic respiratory failure w/ COPD on 7L [place], oxygen dependent, mediasternal adenopathy on chronic steroids, chronic diastolic heart failure, HTN, HLD, MV prolapse, pulmonary nodules, pulmonary HTN, mass of mediastinum, active tobacco use, depression, diabetes  Significant Hospital Events   8/5 >> Admit, diuresis 8/6 >> 10L [place], continue to aggressively diuresis, PT/OT  ordered   Consults:  PCCM 8/6  Procedures:    Significant Diagnostic Tests:    Micro Data:  SARS-CoV-2 8/5: Negative  Antimicrobials:    Interim history/subjective:  "feels better today," reports non-productive cough, shortness of breath, denies any fevers  Appears to get winded easily when talking  Objective   Blood pressure (Abnormal) 110/58, pulse 92, temperature 98.4 F (36.9 C), temperature source Oral, resp. rate (Abnormal) 21, height 5\' 3"  (1.6 m), weight 105 kg, SpO2 (Abnormal) 89 %.    FiO2 (%):  [50 %] 50 %   Intake/Output Summary (Last 24 hours) at 08/27/2018 1037 Last data filed at 08/27/2018 0644 Gross per 24 hour  Intake 360 ml  Output 1000 ml  Net -640 ml   Filed Weights   08/26/18 2121  Weight: 105 kg    Examination: General: 73 year old obese, chronically critically ill appearing female HENT: [place]/AT, MMM Lungs: Diminished throughout, basiliar wheezes, winded easily, currently on 10L [place] Cardiovascular: S1/S2, no murmurs, rubs, or gallops Abdomen: soft, round, obese, nontender, + bowel sounds Extremities: warm, dry skin, pulses palpable Neuro: AOx4, follows commands, moves all extremities, fluent speech GU: Yellow, clear urine  Resolved Hospital Problem list     Assessment & Plan:   Acute on chronic hypoxic respiratory failure in the setting of volume overload due to decompensated diastolic dysfxn and 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  -suspect she is much more chronically hypoxic than we have given her credit for -do not thing she is having bronchospasm or AECOPD -low suspicion for PNA Plan: Maintain O2 saturation > 88% Continue stress-dose steroids, sch and PRN neb, and diuresis Continue BiPAP at  night to improve CO2 levels; suspect she needs nocturnal BIPAP/NIPPV at discharge to support her failing pulmonary fxn: Will need home trilogy machine once discharged  PT/OT Strict I/O Daily weight Repeat ABG   Repeat  CXR in AM  Leukocytosis, low suspicion for pneumonia; likely steroid induced  Plan: Monitor WBC If febrile, culture Hold on empiric ABX for now  Patient is chronically ill. Her lung disease has progressed to end-stage, resulting in cor pulmonale and diastolic dysfunction. In the past 6 months, her COPD has not improved despite steroids and antibiotics. Continue to diuresis and optimize medical management. Continue biPAP at night and trilogy for home to decrease O2 demand.    Critical care time: 45 min     Erick Colace ACNP-BC [place] Pager # [phone] OR # [phone] if no answer

## 2018-08-27 NOTE — Telephone Encounter (Signed)
ATC patient, she did not answer, looks like patient has been admitted to 4E.   Will route to MR as FYI and keep in box until patient is discharged

## 2018-08-27 NOTE — Progress Notes (Signed)
Pt placed on BIPAP for night time rest tolerating well at this time. 

## 2018-08-27 NOTE — Patient Outreach (Signed)
[name]) Care Management  08/27/2018  [name] [birth year] [id number]   RN Health CoachHospitalization  Referral Date: 11/03/2017 Referral Source: MD Referral Reason for Referral: Housing Resources & Disease Management Education Insurance:Medicare   Outreach Attempt:  Received notification patient admitted to [facility] for respiratory distress.  Patient being treated for COPD exacerbation and heart failure.  RN Health Coach sent message to [facility] notifying of admission.  Plan:  RN Health Coach will await recommendations from [facility] for discharge plan and needs.  [name] [phone] [name]

## 2018-08-27 NOTE — Progress Notes (Signed)
PROGRESS NOTE    [name]  LFY:[id number] DOB: 18-Jul-1945 DOA: 08/26/2018 PCP: Binnie Rail, MD    Brief Narrative:  Patient with medical history significant for COPD with chronic respiratory failure on 6 to 7 L of oxygen at home, chronic prednisone therapy, chronic diastolic congestive heart failure, insulin-dependent diabetes, hypertension, hyperlipidemia, anxiety, ongoing smoker, end-stage COPD presenting to the emergency room with chronic shortness of breath worse for the last few days.  In the emergency room, hemodynamically stable.  92% on nonrebreather on 10 L.  WBC 15.6.  Otherwise fairly stable.  Patient was found hypercapnic with 7.23, PCO2 72.5.  COVID-19 negative.  Chest x-ray showed enlarged cardiac shadow and pulmonary edema which is probably chronic.  She was admitted to hospital due to severity of symptoms.   Assessment & Plan:   Principal Problem:   Acute on chronic respiratory failure with hypoxia (HCC) Active Problems:   Hyperlipidemia   Anxiety   COPD (chronic obstructive pulmonary disease) (HCC)   Acute on chronic diastolic CHF (congestive heart failure) (HCC)   Diabetes (HCC)   Depression   Hyperkalemia  Acute on chronic hypercapnic and hypoxic respiratory failure: Suspect multifactorial.  End-stage COPD on ongoing smoker and diastolic dysfunction. Aggressive bronchodilator therapy, IV steroids, inhalational steroids, scheduled and as needed bronchodilators, deep breathing exercises, incentive spirometry, chest physiotherapy and respiratory therapy consult. Supplemental oxygen to keep saturations more than 92%. She is followed by pulmonology outpatient, I called and discussed case with her pulmonologist and they will see patient in the hospital.  Trial of IV diuretics today. Leukocytosis is due to steroid.  No evidence of superadded infection. Due to significant hypoxia and hypercapnia, she may benefit with ventilator at home.  Hyperkalemia: Improved.  Given  a dose of Lokelma.  Insulin-dependent diabetes with hyperglycemia: Known A1c of 6.5.  Patient is on insulin, doses were increased to cover for high-dose steroids.  GERD: On PPI.  Anxiety/depression: On paroxetine and Xanax.  That she will continue.  Patient has end-stage COPD, she is on palliative follow-up at home however she is full code.  She continues to smoke, started counseling and patient was fairly apprehensive.   DVT prophylaxis: Subcu heparin Code Status: Full code Family Communication: None Disposition Plan: Home after hospitalization.  Work with PT OT today.  Anticipate 48 to 72 hours.   Consultants:   PCCM  Procedures:   None  Antimicrobials:   None   Subjective: Patient seen and examined.  She was not quite happy that I woke her up and did not get her breakfast in time. Patient is not very forthcoming about her story. No other overnight events.  Remains on BiPAP last night.  Currently on 7 L oxygen.  Objective: Vitals:   08/27/18 0758 08/27/18 0805 08/27/18 0806 08/27/18 0820  BP:    (!) 110/58  Pulse:    92  Resp:    (!) 21  Temp:      TempSrc:      SpO2: 100% 100% 100% (!) 89%  Weight:      Height:        Intake/Output Summary (Last 24 hours) at 08/27/2018 1423 Last data filed at 08/27/2018 0644 Gross per 24 hour  Intake 360 ml  Output 1000 ml  Net -640 ml   Filed Weights   08/26/18 2121  Weight: 105 kg    Examination:  General exam: Appears calm and comfortable, hirsutism, anxious. Respiratory system: Bilateral diffuse expiratory wheezes.  Respiratory effort normal. Cardiovascular system:  S1 & S2 heard, RRR. No JVD, murmurs, rubs, gallops or clicks. No pedal edema. Gastrointestinal system: Abdomen is nondistended, soft and nontender. No organomegaly or masses felt. Normal bowel sounds heard.  Obese pendulous. Central nervous system: Alert and oriented. No focal neurological deficits. Extremities: Symmetric 5 x 5 power. Skin: No rashes,  lesions or ulcers Psychiatry: Judgement and insight appear normal. Mood & affect appropriate.     Data Reviewed: I have personally reviewed following labs and imaging studies  CBC: Recent Labs  Lab 08/26/18 1633 08/26/18 1846 08/27/18 0250  WBC 15.6*  --  18.0*  NEUTROABS 13.3*  --   --   HGB 10.1* 12.2 9.6*  HCT 40.0 36.0 41.4  MCV 82.3  --  89.8  PLT 563*  --  465*   Basic Metabolic Panel: Recent Labs  Lab 08/26/18 1633 08/26/18 1846 08/26/18 2148 08/27/18 0250  NA 138 139 141 142  K 5.4* 5.6* 5.2* 5.0  CL 104  --  102 100  CO2 24  --  28 29  GLUCOSE 416*  --  464* 367*  BUN 25*  --  26* 27*  CREATININE 1.31*  --  1.22* 1.37*  CALCIUM 9.0  --  9.2 8.9   GFR: Estimated Creatinine Clearance: 43.6 mL/min (A) (by C-G formula based on SCr of 1.37 mg/dL (H)). Liver Function Tests: No results for input(s): AST, ALT, ALKPHOS, BILITOT, PROT, ALBUMIN in the last 168 hours. No results for input(s): LIPASE, AMYLASE in the last 168 hours. No results for input(s): AMMONIA in the last 168 hours. Coagulation Profile: No results for input(s): INR, PROTIME in the last 168 hours. Cardiac Enzymes: No results for input(s): CKTOTAL, CKMB, CKMBINDEX, TROPONINI in the last 168 hours. BNP (last 3 results) No results for input(s): PROBNP in the last 8760 hours. HbA1C: Recent Labs    08/26/18 2148  HGBA1C 9.1*   CBG: Recent Labs  Lab 08/26/18 2132 08/27/18 0619 08/27/18 1141  GLUCAP 444* 313* 308*   Lipid Profile: No results for input(s): CHOL, HDL, LDLCALC, TRIG, CHOLHDL, LDLDIRECT in the last 72 hours. Thyroid Function Tests: No results for input(s): TSH, T4TOTAL, FREET4, T3FREE, THYROIDAB in the last 72 hours. Anemia Panel: No results for input(s): VITAMINB12, FOLATE, FERRITIN, TIBC, IRON, RETICCTPCT in the last 72 hours. Sepsis Labs: No results for input(s): PROCALCITON, LATICACIDVEN in the last 168 hours.  Recent Results (from the past 240 hour(s))  SARS  Coronavirus 2 [facility] order, Performed in [facility] hospital lab) Nasopharyngeal Nasopharyngeal Swab     Status: None   Collection Time: 08/26/18  4:24 PM   Specimen: Nasopharyngeal Swab  Result Value Ref Range Status   SARS Coronavirus 2 NEGATIVE NEGATIVE Final    Comment: (NOTE) If result is NEGATIVE SARS-CoV-2 target nucleic acids are NOT DETECTED. The SARS-CoV-2 RNA is generally detectable in upper and lower  respiratory specimens during the acute phase of infection. The lowest  concentration of SARS-CoV-2 viral copies this assay can detect is 250  copies / mL. A negative result does not preclude SARS-CoV-2 infection  and should not be used as the sole basis for treatment or other  patient management decisions.  A negative result may occur with  improper specimen collection / handling, submission of specimen other  than nasopharyngeal swab, presence of viral mutation(s) within the  areas targeted by this assay, and inadequate number of viral copies  (<250 copies / mL). A negative result must be combined with clinical  observations, patient history, and  epidemiological information. If result is POSITIVE SARS-CoV-2 target nucleic acids are DETECTED. The SARS-CoV-2 RNA is generally detectable in upper and lower  respiratory specimens dur ing the acute phase of infection.  Positive  results are indicative of active infection with SARS-CoV-2.  Clinical  correlation with patient history and other diagnostic information is  necessary to determine patient infection status.  Positive results do  not rule out bacterial infection or co-infection with other viruses. If result is PRESUMPTIVE POSTIVE SARS-CoV-2 nucleic acids MAY BE PRESENT.   A presumptive positive result was obtained on the submitted specimen  and confirmed on repeat testing.  While 2019 novel coronavirus  (SARS-CoV-2) nucleic acids may be present in the submitted sample  additional confirmatory testing may be necessary  for epidemiological  and / or clinical management purposes  to differentiate between  SARS-CoV-2 and other Sarbecovirus currently known to infect humans.  If clinically indicated additional testing with an alternate test  methodology [id number]) is advised. The SARS-CoV-2 RNA is generally  detectable in upper and lower respiratory sp ecimens during the acute  phase of infection. The expected result is Negative. Fact Sheet for Patients:  StrictlyIdeas.no Fact Sheet for Healthcare Providers: BankingDealers.co.za This test is not yet approved or cleared by the [place] FDA and has been authorized for detection and/or diagnosis of SARS-CoV-2 by FDA under an Emergency Use Authorization (EUA).  This EUA will remain in effect (meaning this test can be used) for the duration of the COVID-19 declaration under Section 564(b)(1) of the Act, 21 U.S.C. section 360bbb-3(b)(1), unless the authorization is terminated or revoked sooner. Performed at [facility], [place] [address], [place], [place] [address]          Radiology Studies: Dg Chest Port 1 View  Result Date: 08/26/2018 CLINICAL DATA:  Shortness of breath EXAM: PORTABLE CHEST 1 VIEW COMPARISON:  Radiograph 01/26/2018, chest CT 10/29/2016 FINDINGS: Increasing patchy opacity in the bilateral lung bases and retrocardiac space. The vascularity is indistinct and cephalized. The cardiac silhouette appears enlarged when compared to prior studies accounting for portable technique. Suspect bilateral effusions with obscuration of both hemidiaphragms. No visible pneumothorax. No acute osseous or soft tissue abnormality. Cardiac monitoring leads overlie the chest. IMPRESSION: Findings of heart failure/volume overload with cardiomegaly, edema and likely bilateral effusions. More confluent opacity could reflect alveolar edema versus infection. Electronically Signed   By: Lovena Le M.D.   On: 08/26/2018  17:18        Scheduled Meds: . fluticasone furoate-vilanterol  1 puff Inhalation Daily   And  . umeclidinium bromide  1 puff Inhalation Daily  . furosemide  40 mg Intravenous Q12H  . heparin  5,000 Units Subcutaneous Q8H  . insulin aspart  0-20 Units Subcutaneous TID WC  . insulin aspart  0-5 Units Subcutaneous QHS  . insulin glargine  40 Units Subcutaneous QHS  . ipratropium-albuterol  3 mL Nebulization Q6H  . mouth rinse  15 mL Mouth Rinse BID  . methylPREDNISolone (SOLU-MEDROL) injection  40 mg Intravenous Q12H  . nystatin  5 mL Oral QID  . pantoprazole  40 mg Oral Daily  . PARoxetine  40 mg Oral Daily  . sodium chloride flush  3 mL Intravenous Q12H  . sodium zirconium cyclosilicate  10 g Oral Once   Continuous Infusions:   LOS: 1 day    Time spent: 35 minutes    Barb Merino, MD [facility] Pager [phone]  If 7PM-7AM, please contact night-coverage [email or web address] Password [facility] 08/27/2018,  2:23 PM

## 2018-08-27 NOTE — Progress Notes (Signed)
  Echocardiogram 2D Echocardiogram has been performed.  [name] L Androw 08/27/2018, 2:50 PM

## 2018-08-27 NOTE — Plan of Care (Signed)
  Problem: Pain Managment: Goal: General experience of comfort will improve Outcome: Progressing   Problem: Safety: Goal: Ability to remain free from injury will improve Outcome: Progressing   

## 2018-08-28 ENCOUNTER — Telehealth: Payer: Self-pay | Admitting: Adult Health

## 2018-08-28 ENCOUNTER — Inpatient Hospital Stay (HOSPITAL_COMMUNITY): Payer: Medicare Other

## 2018-08-28 LAB — GLUCOSE, CAPILLARY
Glucose-Capillary: 156 mg/dL — ABNORMAL HIGH (ref 70–99)
Glucose-Capillary: 159 mg/dL — ABNORMAL HIGH (ref 70–99)
Glucose-Capillary: 209 mg/dL — ABNORMAL HIGH (ref 70–99)
Glucose-Capillary: 299 mg/dL — ABNORMAL HIGH (ref 70–99)
Glucose-Capillary: 191 mg/dL — ABNORMAL HIGH (ref 70–99)

## 2018-08-28 MED ORDER — IPRATROPIUM-ALBUTEROL 0.5-2.5 (3) MG/3ML IN SOLN
3.0000 mL | Freq: Three times a day (TID) | RESPIRATORY_TRACT | Status: DC
  Administered 2018-08-28 – 2018-09-02 (×15): 3 mL via RESPIRATORY_TRACT
  Filled 2018-08-28 (×15): qty 3

## 2018-08-28 MED ORDER — FUROSEMIDE 40 MG PO TABS
40.0000 mg | ORAL_TABLET | Freq: Two times a day (BID) | ORAL | Status: DC
  Administered 2018-08-28 – 2018-09-05 (×16): 40 mg via ORAL
  Filled 2018-08-28 (×16): qty 1

## 2018-08-28 NOTE — Consult Note (Signed)
   [facility] CM Inpatient Consult   08/28/2018  [name] August 06, 1945 [id number]   Patient was active with [facility] for chronic disease management services.  Patient has been engaged by a [facility] prior to admission.  Our community based plan of care has focused on disease management.  Patient in the Medicare [place].  Patient is also active with [name] program for symptom management and support while at the time remaining a full code.  Admitted with COPD Exacerbation.  Chart review of HPI is as follows from 08/26/2018:   [name] is a 73 y.o. female with medical history significant for COPD with chronic respiratory failure on 5 L O2 via [place] and chronic prednisone therapy, chronic diastolic CHF, insulin-dependent diabetes, hypertension, hyperlipidemia, anxiety/depression, and tobacco use who presents to the ED for evaluation of worsening dyspnea.  Patient reports worsening dyspnea over the last 1-2 days and become short of breath with minimal activity.  She has not had any associated cough.  She had a telemedicine visit with pulmonology day prior to admission on 08/25/2018 who recommended patient call EMS for evaluation in the ED due to worsening symptoms and increased O2 requirement from baseline 5 L O2 via [place] to 7 L O2.   Plan: Follow up Inpatient [facility] team member to make aware that [facility] following and check patient's progress/disposition needs.   Of note, [facility] Care Management services does not replace or interfere with any services that are needed or arranged by inpatient [facility] care management team.  For additional questions or referrals please contact:   Natividad Brood, RN BSN [facility] Liaison  [phone] business mobile phone Toll free office [phone]  Fax number: [phone] [email or web address]

## 2018-08-28 NOTE — Evaluation (Signed)
Physical Therapy Evaluation Patient Details Name: [name] MRN: [id number] DOB: 1945-03-14 Today's Date: 08/28/2018   History of Present Illness  Patient is a 73 year old female admitted with dyspnea. She is on 5 lpm O2 at home, has COPD, CHF, DM, HTN, HLD, anxiety.  Clinical Impression  Patient received in bed, just received lunch try. Reports she just woke up. Agrees to PT evaluation and to get up to recliner to eat lunch. Patient performed bed mobility with modified independence. (use of bed rails and increased time). Performed sit to stand transfer and ambulation with RW 3 feet with min guard assist. O2 sats dropped to 86% with mobility on 13 lpm O2. Returned to 90% within brief time with rest. Patient will benefit from continued skilled PT acutely to improve strength, mobility and activity tolerance for improved safety.       Follow Up Recommendations SNF    Equipment Recommendations  None recommended by PT    Recommendations for Other Services       Precautions / Restrictions Precautions Precautions: Fall Restrictions Weight Bearing Restrictions: No      Mobility  Bed Mobility Overal bed mobility: Modified Independent             General bed mobility comments: use of bed rails, increased time  Transfers Overall transfer level: Needs assistance Equipment used: Rolling walker (2 wheeled) Transfers: Sit to/from Stand Sit to Stand: Min assist            Ambulation/Gait Ambulation/Gait assistance: Min guard Gait Distance (Feet): 3 Feet Assistive device: Rolling walker (2 wheeled) Gait Pattern/deviations: Step-to pattern;Decreased step length - right;Decreased step length - left Gait velocity: decreased   General Gait Details: gait distance limited by respiratory status and desaturation with mobility  Stairs            Wheelchair Mobility    Modified Rankin (Stroke Patients Only)       Balance Overall balance assessment: Needs  assistance Sitting-balance support: Feet supported Sitting balance-Leahy Scale: Good     Standing balance support: Bilateral upper extremity supported Standing balance-Leahy Scale: Fair                               Pertinent Vitals/Pain Pain Assessment: No/denies pain    Home Living Family/patient expects to be discharged to:: Private residence Living Arrangements: Alone Available Help at Discharge: Personal care attendant(has aide 3 days a week) Type of Home: Apartment       Home Layout: One level Home Equipment: [place] - 4 wheels;Bedside commode      Prior Function Level of Independence: Independent with assistive device(s)         Comments: limited household ambulator with rollator     Hand Dominance        Extremity/Trunk Assessment   Upper Extremity Assessment Upper Extremity Assessment: Defer to OT evaluation    Lower Extremity Assessment Lower Extremity Assessment: Generalized weakness    Cervical / Trunk Assessment Cervical / Trunk Assessment: (flexed posture)  Communication   Communication: No difficulties  Cognition Arousal/Alertness: Awake/alert Behavior During Therapy: WFL for tasks assessed/performed Overall Cognitive Status: Within Functional Limits for tasks assessed                                        General Comments      Exercises  Assessment/Plan    PT Assessment Patient needs continued PT services  PT Problem List Decreased strength;Decreased mobility;Decreased activity tolerance;Decreased balance;Cardiopulmonary status limiting activity;Obesity       PT Treatment Interventions Therapeutic activities;Gait training;Therapeutic exercise;Patient/family education;Balance training;Functional mobility training    PT Goals (Current goals can be found in the Care Plan section)  Acute Rehab PT Goals Patient Stated Goal: to return home, improve mobility PT Goal Formulation: With patient Time For  Goal Achievement: 09/04/18 Potential to Achieve Goals: Fair    Frequency Min 2X/week   Barriers to discharge Decreased caregiver support      Co-evaluation               AM-PAC PT "6 Clicks" Mobility  Outcome Measure Help needed turning from your back to your side while in a flat bed without using bedrails?: A Lot Help needed moving from lying on your back to sitting on the side of a flat bed without using bedrails?: A Lot Help needed moving to and from a bed to a chair (including a wheelchair)?: A Little Help needed standing up from a chair using your arms (e.g., wheelchair or bedside chair)?: A Little Help needed to walk in hospital room?: A Little Help needed climbing 3-5 steps with a railing? : A Lot 6 Click Score: 15    End of Session Equipment Utilized During Treatment: Gait belt;Oxygen Activity Tolerance: Patient limited by fatigue;Other (comment)(decreased O2 sats with mobility) Patient left: in chair;with call bell/phone within reach Nurse Communication: Mobility status PT Visit Diagnosis: Unsteadiness on feet (R26.81);Muscle weakness (generalized) (M62.81);Difficulty in walking, not elsewhere classified (R26.2);History of falling (Z91.81)    Time: 9470-9628 PT Time Calculation (min) (ACUTE ONLY): 25 min   Charges:   PT Evaluation $PT Eval Moderate Complexity: 1 Mod          Jaden Batchelder, PT, GCS 08/28/18,1:01 PM

## 2018-08-28 NOTE — Care Management Important Message (Signed)
Important Message  Patient Details  Name: [name] MRN: [id number] Date of Birth: Jul 19, 1945   Medicare Important Message Given:  Yes     [name] 08/28/2018, 3:59 PM

## 2018-08-28 NOTE — Consult Note (Signed)
NAMETonie Elsey, MRN:  [id number], DOB:  [birth year], LOS: 2 ADMISSION DATE:  08/26/2018, CONSULTATION DATE:  08/27/2018 REFERRING MD:  Dr. Barb Merino, MD, CHIEF COMPLAINT:  Acute on chronic hypoxic respiratory failure   Brief History   73 year old female with a chronic lung disease who presented to the ED with dyspnea  HPI  73 year old female with a history of COPD on 7L of oxygen, oxygen dependent, chronic diastolic HF, and active tobacco use who presented to the ED with dyspnea.  Patient has end-stage lung disease. Over the course of the last 6 months, patient has been treated episodically for being volume overloaded. In the last 6 weeks, her symptoms have gotten progressively worse as a consequence of her advanced lung disease. Her lung disease has not improved despite medical treatment. She is being followed by palliative care. Palliative care has recommended she change to hospice however she continues on palliative care. She remains a full code without advanced directives in place.  During this admission, she reports fatigue, worsening dyspnea with minimal activity, and O2 saturation of 74-88% on 7L [place]. She denies any fever, productive cough, chest discomfort, palpitations, orthopnea, or increased lower extremity edema. Two days ago, she had a telemedicine visit with pulmonology, who recommended patient call EMS for her worsening dyspnea and increased O2 requirement evaluation. Patient delayed the call to EMS for 24 hours. PCCM consulted for acute on chronic hypoxic respiratory failure.  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  Wore PAP last night.  Continues to have poor insight.  Objective   Blood pressure (!) 91/58, pulse 81, temperature 98.3 F (36.8 C), temperature source Oral, resp. rate 16, height 5\' 3"  (1.6 m), weight 104 kg, SpO2 93 %.    FiO2 (%):  [50 %-60 %] 60 %   Intake/Output Summary (Last 24 hours) at 08/28/2018 1045 Last data filed at 08/28/2018 0900 Gross per 24 hour  Intake 657 ml  Output 1600 ml  Net -943 ml   Filed Weights   08/26/18 2121 08/28/18 0430  Weight: 105 kg 104 kg    Examination: GEN: obese chronically ill woman in NAD HEENT: malampatti 4 CV: RRR, ext warm PULM: Shallow inspiratory efforts, no wheezing today GI: Soft, +BS EXT: edema improved NEURO: moving all 4 ext but weak PSYCH: RASS 0, AOx3, poor insight SKIN: No rashes, chronic venous stasis changes   Assessment & Plan:  # Acute on chronic cor pulmonale and HFPEF improved # Chronic hypercapnea related to weight and advanced COPD # Ongoing smoking # Metabolic syndrome  This patient has chronic respiratory failure due to COPD.  Due to her advanced disease state she will need a NIV at home to reduce work of breathing and prevent readmissions.  BIPAP is not a viable alternative at this time.  OSA is not a significant contributor as evidenced by her negative sleep study in 2012.  Overall,  doing better, on baseline O2.  Switch lasix to PO. Continue steroid taper as ordered.  Once she has her NIV I think she is fine to return home or to rehab depending on primary preference.  I will put in a f/u in 2 weeks in clinic with [facility] or midlevel.  We will sign off, please call if further questions or concerns.

## 2018-08-28 NOTE — Evaluation (Signed)
Occupational Therapy Evaluation Patient Details Name: [name] MRN: [id number] DOB: 1945/08/17 Today's Date: 08/28/2018    History of Present Illness Patient is a 73 year old female admitted with dyspnea. She is on 5 lpm O2 at home, has COPD, CHF, DM, HTN, HLD, anxiety.   Clinical Impression   PT admitted with dyspnea. Pt currently with functional limitiations due to the deficits listed below (see OT problem list). Pt demonstrates need for (A) for all adls. Pt requires (A) with IADLS at this time as well. Pt would not be able to get food from frig into a microwave at this time.  Pt will benefit from skilled OT to increase their independence and safety with adls and balance to allow discharge SNF. Pt likely to refuse SNF but could greatly benefit. Pt states "ive gone before and I dont feel like they do anything for me".      Follow Up Recommendations  SNF(HHOT if d/c home)    Equipment Recommendations  None recommended by OT    Recommendations for Other Services       Precautions / Restrictions Precautions Precautions: Fall Precaution Comments: pt currently on 13 L HFNC  Restrictions Weight Bearing Restrictions: No      Mobility Bed Mobility Overal bed mobility: Modified Independent             General bed mobility comments: in chair ending PT session  Transfers Overall transfer level: Needs assistance Equipment used: Rolling walker (2 wheeled) Transfers: Sit to/from Stand Sit to Stand: Min assist         General transfer comment: observed with PT    Balance Overall balance assessment: Needs assistance Sitting-balance support: Feet supported Sitting balance-Leahy Scale: Good     Standing balance support: Bilateral upper extremity supported Standing balance-Leahy Scale: Fair                             ADL either performed or assessed with clinical judgement   ADL Overall ADL's : Needs assistance/impaired Eating/Feeding:  Independent;Sitting Eating/Feeding Details (indicate cue type and reason): required rest breaks to eat due to respiratory Grooming: Wash/dry face;Modified independent Grooming Details (indicate cue type and reason): sitting required                               General ADL Comments: pt transfering to chair iwth PT on arrival. pt fatigued adn needs to rest. pt educated with handout on energy conservation.pt states "i am so glad that you have on there the cold water, I thought i was crazy" Pt expressed sitting by the door to allow visitors to enter the house. pt expressed overly preparing for visitors due to fatigue. Pt describes preparing for more than an hour in advance to let meals on wheels in the house and has to allow them to let themselves into the home due to inability to meet them at the door.      Vision Baseline Vision/History: No visual deficits       Perception     Praxis      Pertinent Vitals/Pain Pain Assessment: No/denies pain     Hand Dominance Right   Extremity/Trunk Assessment Upper Extremity Assessment Upper Extremity Assessment: Generalized weakness   Lower Extremity Assessment Lower Extremity Assessment: Defer to PT evaluation   Cervical / Trunk Assessment Cervical / Trunk Assessment: (flexed posture)   Communication Communication Communication: No difficulties  Cognition Arousal/Alertness: Awake/alert Behavior During Therapy: WFL for tasks assessed/performed Overall Cognitive Status: Within Functional Limits for tasks assessed                                     General Comments  risk for falls and skin break down. Pt requires 13 L of oxygen at this time and seems shocked by that. pt with phone that has no battery. OT takign to the nurses station to charge. RN and Retail buyer aware of patient phone charging at the end of session. Pt very worried and concerned due to house being reports as open and possible break in     Exercises     Shoulder Instructions      Home Living Family/patient expects to be discharged to:: Private residence Living Arrangements: Alone Available Help at Discharge: Personal care attendant(has aide 3 days a week) Type of Home: [place]: One level     Bathroom Shower/Tub: Teacher, early years/pre: Standard     Home Equipment: Environmental consultant - 4 wheels;Bedside commode   Additional Comments: has aide Tues, Thurs, Friday, meal on wheels for meals, has friends but limited contact with family. pt reports son calling for the first time in a long time this admission.       Prior Functioning/Environment Level of Independence: Independent with assistive device(s)        Comments: limited household ambulator with rollator        OT Problem List: Decreased strength;Decreased activity tolerance;Impaired balance (sitting and/or standing);Decreased safety awareness;Decreased knowledge of use of DME or AE;Decreased cognition;Decreased knowledge of precautions;Obesity;Cardiopulmonary status limiting activity      OT Treatment/Interventions: Self-care/ADL training;Therapeutic exercise;Neuromuscular education;Energy conservation;DME and/or AE instruction;Manual therapy;Modalities;Therapeutic activities;Cognitive remediation/compensation;Patient/family education;Balance training    OT Goals(Current goals can be found in the care plan section) Acute Rehab OT Goals Patient Stated Goal: to return home, improve mobility OT Goal Formulation: With patient Time For Goal Achievement: 09/11/18 Potential to Achieve Goals: Good  OT Frequency: Min 2X/week   Barriers to D/C: Decreased caregiver support          Co-evaluation              AM-PAC OT "6 Clicks" Daily Activity     Outcome Measure Help from another person eating meals?: None Help from another person taking care of personal grooming?: A Little Help from another person toileting, which includes  using toliet, bedpan, or urinal?: A Little Help from another person bathing (including washing, rinsing, drying)?: A Little Help from another person to put on and taking off regular upper body clothing?: A Little Help from another person to put on and taking off regular lower body clothing?: A Little 6 Click Score: 19   End of Session Equipment Utilized During Treatment: Oxygen Nurse Communication: Mobility status;Precautions  Activity Tolerance: Patient limited by fatigue Patient left: in chair;with call bell/phone within reach;with bed alarm set  OT Visit Diagnosis: Unsteadiness on feet (R26.81);Muscle weakness (generalized) (M62.81)                Time: 7616-0737 OT Time Calculation (min): 29 min Charges:  OT General Charges $OT Visit: 1 Visit OT Evaluation $OT Eval Moderate Complexity: 1 Mod OT Treatments $Self Care/Home Management : 8-22 mins   Jeri Modena, OTR/L  Acute Rehabilitation Services Pager: [phone] Office: [phone] .   Jeri Modena 08/28/2018, 3:42  PM

## 2018-08-28 NOTE — Progress Notes (Signed)
PROGRESS NOTE    [name]  DGU:[id number] DOB: 11-19-1945 DOA: 08/26/2018 PCP: Binnie Rail, MD    Brief Narrative:  Patient with medical history significant for COPD with chronic respiratory failure on 6 to 7 L of oxygen at home, chronic prednisone therapy, chronic diastolic congestive heart failure, insulin-dependent diabetes, hypertension, hyperlipidemia, anxiety, ongoing smoker, end-stage COPD presenting to the emergency room with chronic shortness of breath worse for the last few days.  In the emergency room, hemodynamically stable.  92% on nonrebreather on 10 L.  WBC 15.6.  Otherwise fairly stable.  Patient was found hypercapnic with 7.23, PCO2 72.5.  COVID-19 negative.  Chest x-ray showed enlarged cardiac shadow and pulmonary edema which is probably chronic.  She was admitted to hospital due to severity of symptoms.   Assessment & Plan:   Principal Problem:   Acute on chronic respiratory failure with hypoxia (HCC) Active Problems:   Hyperlipidemia   Anxiety   COPD (chronic obstructive pulmonary disease) (HCC)   Acute on chronic diastolic CHF (congestive heart failure) (HCC)   Diabetes (HCC)   Depression   Hyperkalemia  Acute on chronic hypercapnic and hypoxic respiratory failure: Suspect multifactorial.  End-stage COPD, ongoing smoker and diastolic dysfunction. Aggressive bronchodilator therapy, IV steroids and taper to oral, inhalational steroids, scheduled and as needed bronchodilators, deep breathing exercises, incentive spirometry, chest physiotherapy and respiratory therapy consult. Supplemental oxygen to keep saturations more than 92%. Trial of IV diuretics, change to oral.  Leukocytosis is due to steroid.  No evidence of superadded infection. Due to significant hypoxia and hypercapnia, she is prescribed Triology at home.   Hyperkalemia: Improved.  Given a dose of Lokelma.  Insulin-dependent diabetes with hyperglycemia: Known A1c of 6.5.  Patient is on insulin, doses  were increased to cover for high-dose steroids.  GERD: On PPI.  Anxiety/depression: On paroxetine and Xanax.  That she will continue.  Patient has end-stage COPD, she is on palliative follow-up at home however she is full code.  She continues to smoke.    DVT prophylaxis: Subcu heparin Code Status: Full code Family Communication: None Disposition Plan: Telemetry bed.  Work with PT OT today.  Anticipate home after arrangement of Trelegy machine at home.   Consultants:   PCCM  Procedures:   None  Antimicrobials:   None   Subjective: "This bed is not right, breakfast tray is not set right, I don't not know how I am breathing" No reported overnight events.  She is on 6 L oxygen.   Objective: Vitals:   08/28/18 0700 08/28/18 0754 08/28/18 0900 08/28/18 1055  BP:  (!) 91/58    Pulse: 81 81 (!) 117 87  Resp: 16 16 (!) 27 20  Temp:  98.3 F (36.8 C)    TempSrc:  Oral    SpO2: 94% 93% (!) 84% 95%  Weight:      Height:        Intake/Output Summary (Last 24 hours) at 08/28/2018 1203 Last data filed at 08/28/2018 0900 Gross per 24 hour  Intake 657 ml  Output 1600 ml  Net -943 ml   Filed Weights   08/26/18 2121 08/28/18 0430  Weight: 105 kg 104 kg    Examination:  General exam: Appears calm and comfortable, hirsutism, anxious. Respiratory system: Bilateral diffuse expiratory wheezes.  Respiratory effort normal. Cardiovascular system: S1 & S2 heard, RRR. No JVD, murmurs, rubs, gallops or clicks. No pedal edema. Gastrointestinal system: Abdomen is nondistended, soft and nontender. No organomegaly or masses felt.  Normal bowel sounds heard.  Obese pendulous. Central nervous system: Alert and oriented. No focal neurological deficits. Extremities: Symmetric 5 x 5 power. Skin: No rashes, lesions or ulcers Psychiatry: Judgement and insight appear poor. Mood & affect flat.    Data Reviewed: I have personally reviewed following labs and imaging studies  CBC: Recent  Labs  Lab 08/26/18 1633 08/26/18 1846 08/27/18 0250  WBC 15.6*  --  18.0*  NEUTROABS 13.3*  --   --   HGB 10.1* 12.2 9.6*  HCT 40.0 36.0 41.4  MCV 82.3  --  89.8  PLT 563*  --  709*   Basic Metabolic Panel: Recent Labs  Lab 08/26/18 1633 08/26/18 1846 08/26/18 2148 08/27/18 0250  NA 138 139 141 142  K 5.4* 5.6* 5.2* 5.0  CL 104  --  102 100  CO2 24  --  28 29  GLUCOSE 416*  --  464* 367*  BUN 25*  --  26* 27*  CREATININE 1.31*  --  1.22* 1.37*  CALCIUM 9.0  --  9.2 8.9   GFR: Estimated Creatinine Clearance: 43.4 mL/min (A) (by C-G formula based on SCr of 1.37 mg/dL (H)). Liver Function Tests: No results for input(s): AST, ALT, ALKPHOS, BILITOT, PROT, ALBUMIN in the last 168 hours. No results for input(s): LIPASE, AMYLASE in the last 168 hours. No results for input(s): AMMONIA in the last 168 hours. Coagulation Profile: No results for input(s): INR, PROTIME in the last 168 hours. Cardiac Enzymes: No results for input(s): CKTOTAL, CKMB, CKMBINDEX, TROPONINI in the last 168 hours. BNP (last 3 results) No results for input(s): PROBNP in the last 8760 hours. HbA1C: Recent Labs    08/26/18 2148  HGBA1C 9.1*   CBG: Recent Labs  Lab 08/27/18 1141 08/27/18 1643 08/27/18 2137 08/28/18 0605 08/28/18 1113  GLUCAP 308* 218* 216* 156* 159*   Lipid Profile: No results for input(s): CHOL, HDL, LDLCALC, TRIG, CHOLHDL, LDLDIRECT in the last 72 hours. Thyroid Function Tests: No results for input(s): TSH, T4TOTAL, FREET4, T3FREE, THYROIDAB in the last 72 hours. Anemia Panel: No results for input(s): VITAMINB12, FOLATE, FERRITIN, TIBC, IRON, RETICCTPCT in the last 72 hours. Sepsis Labs: No results for input(s): PROCALCITON, LATICACIDVEN in the last 168 hours.  Recent Results (from the past 240 hour(s))  SARS Coronavirus 2 [facility] order, Performed in [facility] hospital lab) Nasopharyngeal Nasopharyngeal Swab     Status: None   Collection Time: 08/26/18  4:24 PM    Specimen: Nasopharyngeal Swab  Result Value Ref Range Status   SARS Coronavirus 2 NEGATIVE NEGATIVE Final    Comment: (NOTE) If result is NEGATIVE SARS-CoV-2 target nucleic acids are NOT DETECTED. The SARS-CoV-2 RNA is generally detectable in upper and lower  respiratory specimens during the acute phase of infection. The lowest  concentration of SARS-CoV-2 viral copies this assay can detect is 250  copies / mL. A negative result does not preclude SARS-CoV-2 infection  and should not be used as the sole basis for treatment or other  patient management decisions.  A negative result may occur with  improper specimen collection / handling, submission of specimen other  than nasopharyngeal swab, presence of viral mutation(s) within the  areas targeted by this assay, and inadequate number of viral copies  (<250 copies / mL). A negative result must be combined with clinical  observations, patient history, and epidemiological information. If result is POSITIVE SARS-CoV-2 target nucleic acids are DETECTED. The SARS-CoV-2 RNA is generally detectable in upper and lower  respiratory specimens dur ing the acute phase of infection.  Positive  results are indicative of active infection with SARS-CoV-2.  Clinical  correlation with patient history and other diagnostic information is  necessary to determine patient infection status.  Positive results do  not rule out bacterial infection or co-infection with other viruses. If result is PRESUMPTIVE POSTIVE SARS-CoV-2 nucleic acids MAY BE PRESENT.   A presumptive positive result was obtained on the submitted specimen  and confirmed on repeat testing.  While 2019 novel coronavirus  (SARS-CoV-2) nucleic acids may be present in the submitted sample  additional confirmatory testing may be necessary for epidemiological  and / or clinical management purposes  to differentiate between  SARS-CoV-2 and other Sarbecovirus currently known to infect humans.  If  clinically indicated additional testing with an alternate test  methodology [phone]) is advised. The SARS-CoV-2 RNA is generally  detectable in upper and lower respiratory sp ecimens during the acute  phase of infection. The expected result is Negative. Fact Sheet for Patients:  StrictlyIdeas.no Fact Sheet for Healthcare Providers: BankingDealers.co.za This test is not yet approved or cleared by the [place] FDA and has been authorized for detection and/or diagnosis of SARS-CoV-2 by FDA under an Emergency Use Authorization (EUA).  This EUA will remain in effect (meaning this test can be used) for the duration of the COVID-19 declaration under Section 564(b)(1) of the Act, 21 U.S.C. section 360bbb-3(b)(1), unless the authorization is terminated or revoked sooner. Performed at [facility], [place] [address], [place], [place] [address]          Radiology Studies: Dg Chest Port 1 View  Result Date: 08/28/2018 CLINICAL DATA:  Heart failure EXAM: PORTABLE CHEST 1 VIEW COMPARISON:  Two days ago FINDINGS: Stable cardiomegaly. Aortic tortuosity accentuated by leftward rotation. Small left pleural effusion which appears loculated laterally. There is emphysema with superimposed interstitial coarsening. IMPRESSION: 1. History of heart failure with stable cardiomegaly and interstitial opacity. There is a left pleural effusion which appears loculated laterally. 2. Emphysema. Electronically Signed   By: Monte Fantasia M.D.   On: 08/28/2018 08:40   Dg Chest Port 1 View  Result Date: 08/26/2018 CLINICAL DATA:  Shortness of breath EXAM: PORTABLE CHEST 1 VIEW COMPARISON:  Radiograph 01/26/2018, chest CT 10/29/2016 FINDINGS: Increasing patchy opacity in the bilateral lung bases and retrocardiac space. The vascularity is indistinct and cephalized. The cardiac silhouette appears enlarged when compared to prior studies accounting for portable technique.  Suspect bilateral effusions with obscuration of both hemidiaphragms. No visible pneumothorax. No acute osseous or soft tissue abnormality. Cardiac monitoring leads overlie the chest. IMPRESSION: Findings of heart failure/volume overload with cardiomegaly, edema and likely bilateral effusions. More confluent opacity could reflect alveolar edema versus infection. Electronically Signed   By: Lovena Le M.D.   On: 08/26/2018 17:18        Scheduled Meds: . fluticasone furoate-vilanterol  1 puff Inhalation Daily   And  . umeclidinium bromide  1 puff Inhalation Daily  . furosemide  40 mg Oral BID  . heparin  5,000 Units Subcutaneous Q8H  . insulin aspart  0-20 Units Subcutaneous TID WC  . insulin aspart  0-5 Units Subcutaneous QHS  . insulin glargine  40 Units Subcutaneous QHS  . ipratropium-albuterol  3 mL Nebulization TID  . mouth rinse  15 mL Mouth Rinse BID  . nystatin  5 mL Oral QID  . pantoprazole  40 mg Oral Daily  . PARoxetine  40 mg Oral Daily  .  predniSONE  40 mg Oral Q breakfast   Followed by  . [START ON 08/31/2018] predniSONE  20 mg Oral Q breakfast  . sodium chloride flush  3 mL Intravenous Q12H  . sodium zirconium cyclosilicate  10 g Oral Once   Continuous Infusions:   LOS: 2 days    Time spent: 25 minutes    Barb Merino, MD [facility] Pager [phone]  If 7PM-7AM, please contact night-coverage [email or web address] Password [email or web address] 08/28/2018, 12:03 PM

## 2018-08-28 NOTE — Telephone Encounter (Signed)
Toni Furbish, MD  P [facility] Triage Pool        2 week f/u with midlevel or [facility]. Hospitalized for CHF and COPD.   --------------------------------------- Pt is still currently admitted. Will follow up.

## 2018-08-29 LAB — CBC WITH DIFFERENTIAL/PLATELET
Abs Immature Granulocytes: 0.08 10*3/uL — ABNORMAL HIGH (ref 0.00–0.07)
Basophils Absolute: 0 10*3/uL (ref 0.0–0.1)
Basophils Relative: 0 %
Eosinophils Absolute: 0 10*3/uL (ref 0.0–0.5)
Eosinophils Relative: 0 %
HCT: 35.7 % — ABNORMAL LOW (ref 36.0–46.0)
Hemoglobin: 9.4 g/dL — ABNORMAL LOW (ref 12.0–15.0)
Immature Granulocytes: 1 %
Lymphocytes Relative: 9 %
Lymphs Abs: 1.2 10*3/uL (ref 0.7–4.0)
MCH: 21.1 pg — ABNORMAL LOW (ref 26.0–34.0)
MCHC: 26.3 g/dL — ABNORMAL LOW (ref 30.0–36.0)
MCV: 80 fL (ref 80.0–100.0)
Monocytes Absolute: 1.5 10*3/uL — ABNORMAL HIGH (ref 0.1–1.0)
Monocytes Relative: 11 %
Neutro Abs: 11.2 10*3/uL — ABNORMAL HIGH (ref 1.7–7.7)
Neutrophils Relative %: 79 %
Platelets: 430 10*3/uL — ABNORMAL HIGH (ref 150–400)
RBC: 4.46 MIL/uL (ref 3.87–5.11)
RDW: 18.9 % — ABNORMAL HIGH (ref 11.5–15.5)
WBC: 14 10*3/uL — ABNORMAL HIGH (ref 4.0–10.5)
nRBC: 1 % — ABNORMAL HIGH (ref 0.0–0.2)

## 2018-08-29 LAB — BASIC METABOLIC PANEL
Anion gap: 10 (ref 5–15)
BUN: 26 mg/dL — ABNORMAL HIGH (ref 8–23)
CO2: 36 mmol/L — ABNORMAL HIGH (ref 22–32)
Calcium: 8.7 mg/dL — ABNORMAL LOW (ref 8.9–10.3)
Chloride: 96 mmol/L — ABNORMAL LOW (ref 98–111)
Creatinine, Ser: 1.08 mg/dL — ABNORMAL HIGH (ref 0.44–1.00)
GFR calc Af Amer: 60 mL/min — ABNORMAL LOW (ref >60)
GFR calc non Af Amer: 52 mL/min — ABNORMAL LOW (ref >60)
Glucose, Bld: 210 mg/dL — ABNORMAL HIGH (ref 70–99)
Potassium: 4.1 mmol/L (ref 3.5–5.1)
Sodium: 142 mmol/L (ref 135–145)

## 2018-08-29 LAB — GLUCOSE, CAPILLARY
Glucose-Capillary: 120 mg/dL — ABNORMAL HIGH (ref 70–99)
Glucose-Capillary: 113 mg/dL — ABNORMAL HIGH (ref 70–99)
Glucose-Capillary: 103 mg/dL — ABNORMAL HIGH (ref 70–99)
Glucose-Capillary: 235 mg/dL — ABNORMAL HIGH (ref 70–99)
Glucose-Capillary: 223 mg/dL — ABNORMAL HIGH (ref 70–99)

## 2018-08-29 NOTE — Progress Notes (Signed)
OT Cancellation Note  Patient Details Name: [name] MRN: [id number] DOB: 04/19/1945   Cancelled Treatment:    Reason Eval/Treat Not Completed: Other (comment)pt. Declined multiple attempts at skilled OT stating "im taking a mental health day, I dont feel like doing nothing or talking to you or anyone".  Kept eyes closed and would not engage.  "does it have to be right now, right this second, I just cant I dont want to".  Reviewed this is our time to work together, she continued to decline attempts.  Will attempt back for tx. As able, reviewed it may not be today.  She still declined.   Janice Coffin, COTA/L 08/29/2018, 11:31 AM

## 2018-08-29 NOTE — Progress Notes (Signed)
Patient is NIV at this time tolerating it well.

## 2018-08-29 NOTE — Progress Notes (Signed)
PROGRESS NOTE    [name]  NOB:[id number] DOB: 1945-09-05 DOA: 08/26/2018 PCP: Binnie Rail, MD    Brief Narrative:  [name] is a 73 y.o. female with medical history significant for COPD with chronic respiratory failure on 6 to 7 L of oxygen at home, chronic prednisone therapy, chronic diastolic congestive heart failure, insulin-dependent diabetes, hypertension, hyperlipidemia, anxiety, ongoing smoker, end-stage COPD presenting to the emergency room with chronic shortness of breath worse for the last few days.  In the emergency room, hemodynamically stable.  92% on nonrebreather on 10 L.  WBC 15.6.  Otherwise fairly stable.  Patient was found hypercapnic with 7.23, PCO2 72.5.  COVID-19 negative.  Chest x-ray showed enlarged cardiac shadow and pulmonary edema which is probably chronic.  She was admitted to hospital due to severity of symptoms.   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  Patient was noted to be hypercapnic with a PCO2 of 72.5 on ABG.  Chest x-ray showed enlarged cardiac silhouette with mild pulmonary edema.  Patient was started on aggressive bronchodilator therapy, IV steroids and inhaled steroids. --Continue steroid taper; prednisone 40 mg p.o. QD x 3 days, then resume home dose of 20 mg p.o. daily --Continue bronchodilators, incentive spirometry, chest physiotherapy --Currently on 10 L HFNC; oxygenating 99%, titrate for SPO2 greater than 88% (home 6-7L) --Continue oral furosemide 40 mg p.o. twice daily --Awaiting Trilogy  vent per case management prior to discharge --Patient to follow-up with pulmonary 2 weeks with Dr. [name] or [facility].  Hyperkalemia:  Resolved K 5.6 on admission, received dose of Lokelma. --Potassium 4.1 today.  Insulin-dependent diabetes with hyperglycemia:  Hemoglobin A1c 9.1 on 08/26/2018.  Home regimen includes  Lantus 22u qHS. --Continue Lantus 40 units [place] qHS for now; hyperglycemia likely complicated by steroids --Continue NovoLog insulin sliding scale for coverage  GERD: continue PPI  Anxiety/depression:  --continue paroxetine and Xanax  Acute on chronic diastolic congestive heart failure Patient presenting with respiratory distress as above.  Chest x-ray notable for cardiomegaly with pulmonary vascular congestion.  Transthoracic echo 08/27/2018 notable for EF 94-76% with diastolic dysfunction and mild LVH.  Initially diuresed with IV furosemide and transition to furosemide 40 mg p.o. twice daily.  --Net negative 66mL past 24h, with net negative fluid balance of 1.3 L this hospitalization --wt 105kg-->102.5Kg --Continue furosemide 40 mg p.o. twice daily --Strict I's and O's and daily weights  Continued tobacco abuse Patient continues to smoke.  Discussed with patient need for complete cessation given her end-stage COPD that requires supplemental oxygen.   DVT prophylaxis: Heparin Code Status: Full code Family Communication: None Disposition Plan: Anticipate discharge home after receives Trelegy vent.    Consultants:   PCCM - signed off 8/7  Procedures:   None  Antimicrobials:   None   Subjective: Patient seen and examined at bedside, sleeping and easily arousable.  Continues on high flow nasal cannula, on 10 L this morning; oxygenating 99%.  No other complaints or concerns at this time.  Denies headache, no fever/chills/night sweats, no nausea/vomit/diarrhea, no chest pain, no palpitations, no  shortness of breath that is greater than her normal baseline, no  abdominal pain, no weakness, no paresthesias.  No acute events overnight per nursing staff.  Objective: Vitals:   08/28/18 2226 08/29/18 0448 08/29/18 0805 08/29/18 0822  BP: (!) 106/56 105/66    Pulse: 85 91    Resp: 16 (!) 21    Temp: 98.2 F (36.8 C) 98.2 F (36.8 C)    TempSrc: Oral Oral    SpO2: 96% 95% 96% 100%  Weight:  102.5 kg    Height:        Intake/Output Summary (Last 24 hours) at 08/29/2018 1049 Last data filed at 08/28/2018 1200 Gross per 24 hour  Intake 240 ml  Output -  Net 240 ml   Filed Weights   08/26/18 2121 08/28/18 0430 08/29/18 0448  Weight: 105 kg 104 kg 102.5 kg    Examination:  General exam: Appears calm and comfortable  Respiratory system: Diffuse mid to late expiratory wheezing bilaterally, Respiratory effort normal.,  On 10 L HFNC with SPO2 99%. Cardiovascular system: S1 & S2 heard, RRR. No JVD, murmurs, rubs, gallops or clicks. No pedal edema. Gastrointestinal system: Abdomen is nondistended, soft and nontender. No organomegaly or masses felt. Normal bowel sounds heard. Central nervous system: Alert and oriented. No focal neurological deficits. Extremities: Symmetric 5 x 5 power. Skin: No rashes, lesions or ulcers Psychiatry: Judgement and insight appear normal. Mood & affect appropriate.     Data Reviewed: I have personally reviewed following labs and imaging studies  CBC: Recent Labs  Lab 08/26/18 1633 08/26/18 1846 08/27/18 0250 08/29/18 0240  WBC 15.6*  --  18.0* 14.0*  NEUTROABS 13.3*  --   --  11.2*  HGB 10.1* 12.2 9.6* 9.4*  HCT 40.0 36.0 41.4 35.7*  MCV 82.3  --  89.8 80.0  PLT 563*  --  525* 939*   Basic Metabolic Panel: Recent Labs  Lab 08/26/18 1633 08/26/18 1846 08/26/18 2148 08/27/18 0250 08/29/18 0240  NA 138 139 141 142 142  K 5.4* 5.6* 5.2* 5.0 4.1  CL 104  --  102 100 96*  CO2 24  --  28 29 36*  GLUCOSE 416*  --  464* 367* 210*  BUN 25*  --  26* 27* 26*  CREATININE 1.31*  --  1.22* 1.37* 1.08*   CALCIUM 9.0  --  9.2 8.9 8.7*   GFR: Estimated Creatinine Clearance: 54.6 mL/min (A) (by C-G formula based on SCr of 1.08 mg/dL (H)). Liver Function Tests: No results for input(s): AST, ALT, ALKPHOS, BILITOT, PROT, ALBUMIN in the last 168 hours. No results for input(s): LIPASE, AMYLASE in the last 168 hours. No results for input(s): AMMONIA in the last 168 hours. Coagulation Profile: No results for input(s): INR, PROTIME in the last 168 hours. Cardiac Enzymes: No results for input(s): CKTOTAL, CKMB, CKMBINDEX, TROPONINI in the last 168 hours. BNP (last 3 results) No results for input(s): PROBNP in the last 8760 hours. HbA1C: Recent Labs    08/26/18 2148  HGBA1C 9.1*   CBG: Recent Labs  Lab 08/28/18 1320 08/28/18 1613 08/28/18 2153 08/29/18 0611 08/29/18 0844  GLUCAP 209* 299* 191* 120* 113*   Lipid Profile: No results for input(s): CHOL, HDL, LDLCALC, TRIG, CHOLHDL, LDLDIRECT in the last 72 hours. Thyroid Function Tests: No results for input(s): TSH, T4TOTAL, FREET4, T3FREE, THYROIDAB in the last 72 hours. Anemia Panel: No results for input(s): VITAMINB12, FOLATE, FERRITIN, TIBC, IRON, RETICCTPCT in the last 72 hours. Sepsis Labs: No  results for input(s): PROCALCITON, LATICACIDVEN in the last 168 hours.  Recent Results (from the past 240 hour(s))  SARS Coronavirus 2 [facility] order, Performed in [facility] hospital lab) Nasopharyngeal Nasopharyngeal Swab     Status: None   Collection Time: 08/26/18  4:24 PM   Specimen: Nasopharyngeal Swab  Result Value Ref Range Status   SARS Coronavirus 2 NEGATIVE NEGATIVE Final    Comment: (NOTE) If result is NEGATIVE SARS-CoV-2 target nucleic acids are NOT DETECTED. The SARS-CoV-2 RNA is generally detectable in upper and lower  respiratory specimens during the acute phase of infection. The lowest  concentration of SARS-CoV-2 viral copies this assay can detect is 250  copies / mL. A negative result does not preclude SARS-CoV-2  infection  and should not be used as the sole basis for treatment or other  patient management decisions.  A negative result may occur with  improper specimen collection / handling, submission of specimen other  than nasopharyngeal swab, presence of viral mutation(s) within the  areas targeted by this assay, and inadequate number of viral copies  (<250 copies / mL). A negative result must be combined with clinical  observations, patient history, and epidemiological information. If result is POSITIVE SARS-CoV-2 target nucleic acids are DETECTED. The SARS-CoV-2 RNA is generally detectable in upper and lower  respiratory specimens dur ing the acute phase of infection.  Positive  results are indicative of active infection with SARS-CoV-2.  Clinical  correlation with patient history and other diagnostic information is  necessary to determine patient infection status.  Positive results do  not rule out bacterial infection or co-infection with other viruses. If result is PRESUMPTIVE POSTIVE SARS-CoV-2 nucleic acids MAY BE PRESENT.   A presumptive positive result was obtained on the submitted specimen  and confirmed on repeat testing.  While 2019 novel coronavirus  (SARS-CoV-2) nucleic acids may be present in the submitted sample  additional confirmatory testing may be necessary for epidemiological  and / or clinical management purposes  to differentiate between  SARS-CoV-2 and other Sarbecovirus currently known to infect humans.  If clinically indicated additional testing with an alternate test  methodology [phone]) is advised. The SARS-CoV-2 RNA is generally  detectable in upper and lower respiratory sp ecimens during the acute  phase of infection. The expected result is Negative. Fact Sheet for Patients:  StrictlyIdeas.no Fact Sheet for Healthcare Providers: BankingDealers.co.za This test is not yet approved or cleared by the [place]  FDA and has been authorized for detection and/or diagnosis of SARS-CoV-2 by FDA under an Emergency Use Authorization (EUA).  This EUA will remain in effect (meaning this test can be used) for the duration of the COVID-19 declaration under Section 564(b)(1) of the Act, 21 U.S.C. section 360bbb-3(b)(1), unless the authorization is terminated or revoked sooner. Performed at [facility], [place] [address], [place], [place] [address]          Radiology Studies: Dg Chest Port 1 View  Result Date: 08/28/2018 CLINICAL DATA:  Heart failure EXAM: PORTABLE CHEST 1 VIEW COMPARISON:  Two days ago FINDINGS: Stable cardiomegaly. Aortic tortuosity accentuated by leftward rotation. Small left pleural effusion which appears loculated laterally. There is emphysema with superimposed interstitial coarsening. IMPRESSION: 1. History of heart failure with stable cardiomegaly and interstitial opacity. There is a left pleural effusion which appears loculated laterally. 2. Emphysema. Electronically Signed   By: Monte Fantasia M.D.   On: 08/28/2018 08:40        Scheduled Meds: . fluticasone furoate-vilanterol  1 puff Inhalation Daily   And  . umeclidinium bromide  1 puff Inhalation Daily  . furosemide  40 mg Oral BID  . heparin  5,000 Units Subcutaneous Q8H  . insulin aspart  0-20 Units Subcutaneous TID WC  . insulin aspart  0-5 Units Subcutaneous QHS  . insulin glargine  40 Units Subcutaneous QHS  . ipratropium-albuterol  3 mL Nebulization TID  . mouth rinse  15 mL Mouth Rinse BID  . nystatin  5 mL Oral QID  . pantoprazole  40 mg Oral Daily  . PARoxetine  40 mg Oral Daily  . predniSONE  40 mg Oral Q breakfast   Followed by  . [START ON 08/31/2018] predniSONE  20 mg Oral Q breakfast  . sodium chloride flush  3 mL Intravenous Q12H  . sodium zirconium cyclosilicate  10 g Oral Once   Continuous Infusions:   LOS: 3 days    Time spent: 37 minutes spent on chart review, personally reviewed all  imaging and lab tests, discussion with nursing staff, consultants, updating family and interview/physical exam; more than 50% of that time was spent in counseling and/or coordination of care.    Ona Rathert J [place] DO [facility] Pager [phone]  If 7PM-7AM, please contact night-coverage [email or web address] Password [facility] 08/29/2018, 10:49 AM

## 2018-08-30 LAB — GLUCOSE, CAPILLARY
Glucose-Capillary: 93 mg/dL (ref 70–99)
Glucose-Capillary: 174 mg/dL — ABNORMAL HIGH (ref 70–99)
Glucose-Capillary: 281 mg/dL — ABNORMAL HIGH (ref 70–99)
Glucose-Capillary: 172 mg/dL — ABNORMAL HIGH (ref 70–99)

## 2018-08-30 MED ORDER — NYSTATIN 100000 UNIT/ML MT SUSP
5.0000 mL | Freq: Every day | OROMUCOSAL | Status: DC
  Administered 2018-08-31 – 2018-09-05 (×6): 500000 [IU] via ORAL
  Filled 2018-08-30 (×6): qty 5

## 2018-08-30 NOTE — Progress Notes (Signed)
Occupational Therapy Treatment Patient Details Name: [name] MRN: [id number] DOB: May 14, 1945 Today's Date: 08/30/2018    History of present illness Patient is a 73 year old female admitted with dyspnea. She is on 5 lpm O2 at home, has COPD, CHF, DM, HTN, HLD, anxiety.   OT comments  Pt. Seen for skilled OT.  In better spirits and more agreeable to participation today.  Able to complete bed mobility, LB pericare, and simulated bsc transfer min guard a.    Follow Up Recommendations  SNF    Equipment Recommendations  None recommended by OT    Recommendations for Other Services      Precautions / Restrictions Precautions Precautions: Fall       Mobility Bed Mobility Overal bed mobility: Modified Independent             General bed mobility comments: hob elevated exited on R side of bed  Transfers Overall transfer level: Needs assistance Equipment used: Rolling walker (2 wheeled) Transfers: Sit to/from [facility] Sit to Stand: Min guard Stand pivot transfers: Min guard       General transfer comment: cues for hand placement when sitting down    Balance                                           ADL either performed or assessed with clinical judgement   ADL Overall ADL's : Needs assistance/impaired             Lower Body Bathing: Set up;Bed level Lower Body Bathing Details (indicate cue type and reason): peri care supine in bed         Toilet Transfer: Min Insurance claims handler Details (indicate cue type and reason): simulated from eob to recliner, offered bsc she stated she did not have to use it right now Toileting- Water quality scientist and Hygiene: Min guard;Sit to/from stand Toileting - Clothing Manipulation Details (indicate cue type and reason): simulated during functional mobility     Functional mobility during ADLs: Min guard;Cueing for sequencing;Rolling walker General ADL Comments: cues  for hand placement when sitting down.  review and encourage PLB during rest breaks.  pt. agreeable to remove pure wik. discussed need to engage more during her day and get on/off bsc be out of bed, help with her bath ect. she would cont. to say "what am i gonna do when i get home" but also not engage in participation of ADLs and working on endurance and getting stronger ect.     Vision       Perception     Praxis      Cognition Arousal/Alertness: Awake/alert Behavior During Therapy: WFL for tasks assessed/performed Overall Cognitive Status: Within Functional Limits for tasks assessed                                          Exercises     Shoulder Instructions       General Comments      Pertinent Vitals/ Pain       Pain Assessment: Faces Faces Pain Scale: Hurts little more Pain Location: red/raw area R upper thigh near vagina Pain Descriptors / Indicators: Aching;Burning Pain Intervention(s): Other (comment)(rn applied cream to the area that was hurting pt.)  Home Living  Prior Functioning/Environment              Frequency  Min 2X/week        Progress Toward Goals  OT Goals(current goals can now be found in the care plan section)  Progress towards OT goals: Progressing toward goals     Plan      Co-evaluation                 AM-PAC OT "6 Clicks" Daily Activity     Outcome Measure   Help from another person eating meals?: None Help from another person taking care of personal grooming?: A Little Help from another person toileting, which includes using toliet, bedpan, or urinal?: A Little Help from another person bathing (including washing, rinsing, drying)?: A Little Help from another person to put on and taking off regular upper body clothing?: A Little Help from another person to put on and taking off regular lower body clothing?: A Little 6 Click Score: 19    End  of Session Equipment Utilized During Treatment: Oxygen  OT Visit Diagnosis: Unsteadiness on feet (R26.81);Muscle weakness (generalized) (M62.81)   Activity Tolerance Patient tolerated treatment well   Patient Left in chair;with call bell/phone within reach   Nurse Communication Other (comment)(reviewed with rn c/o raw sore area on r upper thigh, also that pure wik was removed and encouraging bsc throughout the day)        Time: 1050-1105 OT Time Calculation (min): 15 min  Charges: OT General Charges $OT Visit: 1 Visit OT Treatments $Self Care/Home Management : 8-22 mins   Janice Coffin, COTA/L 08/30/2018, 12:59 PM

## 2018-08-30 NOTE — Progress Notes (Addendum)
Neuro intact. Afebrile. Denies pain. Currently @ 8L HFNC Sp02 >88. Difficulty weaning, will desat to high 70's, symptomatic. LS dim, no productive sputum. IS needs enforcement.  NSR, occ pvs. SBP stable.  Tol diet. No BM this shift. + void without issue, removed purewick and is ambulating to bedside commode and in room. Refusing to ambulate in halls at this time.

## 2018-08-30 NOTE — Progress Notes (Signed)
Pt is off the BIPAP now and back to the High flow Oxygen @10l 

## 2018-08-30 NOTE — Telephone Encounter (Signed)
pls arrange post dc virtual visit or telephone visit with an app

## 2018-08-30 NOTE — Progress Notes (Signed)
PROGRESS NOTE    Chanah Tidmore  YYT:[id number] DOB: 12/31/1945 DOA: 08/26/2018 PCP: Binnie Rail, MD    Brief Narrative:  [name] is a 73 y.o. female with medical history significant for COPD with chronic respiratory failure on 6 to 7 L of oxygen at home, chronic prednisone therapy, chronic diastolic congestive heart failure, insulin-dependent diabetes, hypertension, hyperlipidemia, anxiety, ongoing smoker, end-stage COPD presenting to the emergency room with chronic shortness of breath worse for the last few days.  In the emergency room, hemodynamically stable.  92% on nonrebreather on 10 L.  WBC 15.6.  Otherwise fairly stable.  Patient was found hypercapnic with 7.23, PCO2 72.5.  COVID-19 negative.  Chest x-ray showed enlarged cardiac shadow and pulmonary edema which is probably chronic.  She was admitted to hospital due to severity of symptoms.   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  Patient was noted to be hypercapnic with a PCO2 of 72.5 on ABG.  Chest x-ray showed enlarged cardiac silhouette with mild pulmonary edema.  Patient was started on aggressive bronchodilator therapy, IV steroids and inhaled steroids. --Continue steroid taper; prednisone 40 mg p.o. QD x 3 days, then resume home dose of 20 mg p.o. daily --Continue bronchodilators, incentive spirometry, chest physiotherapy --On 10 L HFNC this am; oxygenating 99%, O2 turned down to 7L (home 6-7L); goal SpO2 88% --Continue oral furosemide 40 mg p.o. twice daily --Awaiting Trilogy  vent per case management prior to discharge --Patient to follow-up with pulmonary 2 weeks with Dr. [name] or [facility].  Hyperkalemia:  Resolved K 5.6 on admission, received dose of Lokelma. --Potassium 4.1 today.  Insulin-dependent diabetes with hyperglycemia:  Hemoglobin A1c 9.1 on 08/26/2018.  Home regimen includes  Lantus 22u qHS. --Continue Lantus 40 units [place] qHS for now; hyperglycemia likely complicated by steroids --Continue NovoLog insulin sliding scale for coverage  GERD: continue PPI  Anxiety/depression:  --continue paroxetine and Xanax  Acute on chronic diastolic congestive heart failure Patient presenting with respiratory distress as above.  Chest x-ray notable for cardiomegaly with pulmonary vascular congestion.  Transthoracic echo 08/27/2018 notable for EF 27-51% with diastolic dysfunction and mild LVH.  Initially diuresed with IV furosemide and transition to furosemide 40 mg p.o. twice daily.  --Net negative 69mL past 24h, with net negative fluid balance of 1.3 L this hospitalization --wt 105-->102.5-->97.5Kg --Continue furosemide 40 mg p.o. twice daily --Strict I's and O's and daily weights  Continued tobacco abuse Patient continues to smoke.  Discussed with patient need for complete cessation given her end-stage COPD that requires supplemental oxygen.   DVT prophylaxis: Heparin Code Status: Full code Family Communication: None Disposition Plan: Anticipate discharge home after receives Trelegy vent.    Consultants:   PCCM - signed off 8/7  Procedures:   None  Antimicrobials:   None   Subjective: Patient seen and examined at bedside, sleeping and easily arousable.  Continues on high flow nasal cannula, on 10 L this morning; oxygenating 99%.  No other complaints or concerns at this time.  Discussed patient will turn down oxygen this morning back to  close to her normal baseline of 6-7 L/min; as her goal SPO2 is 88%.  Denies headache, no fever/chills/night  sweats, no nausea/vomit/diarrhea, no chest pain, no palpitations, no shortness of breath that is greater than her normal baseline, no abdominal pain, no weakness, no paresthesias.  No acute events overnight per nursing staff.  Objective: Vitals:   08/29/18 2229 08/30/18 0458 08/30/18 0700 08/30/18 0822  BP:  (!) 97/59    Pulse: 89 85 85   Resp: 14 15 17    Temp:  98.8 F (37.1 C)    TempSrc:  Axillary    SpO2: 98% 99% 98% 99%  Weight:  97.5 kg    Height:        Intake/Output Summary (Last 24 hours) at 08/30/2018 1116 Last data filed at 08/30/2018 0923 Gross per 24 hour  Intake 1455 ml  Output 2000 ml  Net -545 ml   Filed Weights   08/28/18 0430 08/29/18 0448 08/30/18 0458  Weight: 104 kg 102.5 kg 97.5 kg    Examination:  General exam: Appears calm and comfortable  Respiratory system: Diffuse mid to late expiratory wheezing bilaterally, Respiratory effort normal.,  On 10 L HFNC with SPO2 99%. Cardiovascular system: S1 & S2 heard, RRR. No JVD, murmurs, rubs, gallops or clicks. No pedal edema. Gastrointestinal system: Abdomen is nondistended, soft and nontender. No organomegaly or masses felt. Normal bowel sounds heard. Central nervous system: Alert and oriented. No focal neurological deficits. Extremities: Symmetric 5 x 5 power. Skin: No rashes, lesions or ulcers Psychiatry: Judgement and insight appear normal. Mood & affect appropriate.     Data Reviewed: I have personally reviewed following labs and imaging studies  CBC: Recent Labs  Lab 08/26/18 1633 08/26/18 1846 08/27/18 0250 08/29/18 0240  WBC 15.6*  --  18.0* 14.0*  NEUTROABS 13.3*  --   --  11.2*  HGB 10.1* 12.2 9.6* 9.4*  HCT 40.0 36.0 41.4 35.7*  MCV 82.3  --  89.8 80.0  PLT 563*  --  525* 945*   Basic Metabolic Panel: Recent Labs  Lab 08/26/18 1633 08/26/18 1846 08/26/18 2148 08/27/18 0250 08/29/18 0240  NA 138 139 141 142 142  K 5.4* 5.6* 5.2* 5.0 4.1  CL 104  --  102 100 96*  CO2 24  --  28 29  36*  GLUCOSE 416*  --  464* 367* 210*  BUN 25*  --  26* 27* 26*  CREATININE 1.31*  --  1.22* 1.37* 1.08*  CALCIUM 9.0  --  9.2 8.9 8.7*   GFR: Estimated Creatinine Clearance: 53.1 mL/min (A) (by C-G formula based on SCr of 1.08 mg/dL (H)). Liver Function Tests: No results for input(s): AST, ALT, ALKPHOS, BILITOT, PROT, ALBUMIN in the last 168 hours. No results for input(s): LIPASE, AMYLASE in the last 168 hours. No results for input(s): AMMONIA in the last 168 hours. Coagulation Profile: No results for input(s): INR, PROTIME in the last 168 hours. Cardiac Enzymes: No results for input(s): CKTOTAL, CKMB, CKMBINDEX, TROPONINI in the last 168 hours. BNP (last 3 results) No results for input(s): PROBNP in the last 8760 hours. HbA1C: No results for input(s): HGBA1C in the last 72 hours. CBG: Recent Labs  Lab 08/29/18 0844 08/29/18 1120 08/29/18 1659 08/29/18 2046 08/30/18 0638  GLUCAP 113* 103* 235* 223* 93   Lipid Profile: No results for input(s): CHOL, HDL, LDLCALC, TRIG, CHOLHDL, LDLDIRECT in the last 72 hours. Thyroid Function Tests: No results for input(s): TSH, T4TOTAL, FREET4, T3FREE, THYROIDAB in the  last 72 hours. Anemia Panel: No results for input(s): VITAMINB12, FOLATE, FERRITIN, TIBC, IRON, RETICCTPCT in the last 72 hours. Sepsis Labs: No results for input(s): PROCALCITON, LATICACIDVEN in the last 168 hours.  Recent Results (from the past 240 hour(s))  SARS Coronavirus 2 [facility] order, Performed in [facility] hospital lab) Nasopharyngeal Nasopharyngeal Swab     Status: None   Collection Time: 08/26/18  4:24 PM   Specimen: Nasopharyngeal Swab  Result Value Ref Range Status   SARS Coronavirus 2 NEGATIVE NEGATIVE Final    Comment: (NOTE) If result is NEGATIVE SARS-CoV-2 target nucleic acids are NOT DETECTED. The SARS-CoV-2 RNA is generally detectable in upper and lower  respiratory specimens during the acute phase of infection. The lowest  concentration of  SARS-CoV-2 viral copies this assay can detect is 250  copies / mL. A negative result does not preclude SARS-CoV-2 infection  and should not be used as the sole basis for treatment or other  patient management decisions.  A negative result may occur with  improper specimen collection / handling, submission of specimen other  than nasopharyngeal swab, presence of viral mutation(s) within the  areas targeted by this assay, and inadequate number of viral copies  (<250 copies / mL). A negative result must be combined with clinical  observations, patient history, and epidemiological information. If result is POSITIVE SARS-CoV-2 target nucleic acids are DETECTED. The SARS-CoV-2 RNA is generally detectable in upper and lower  respiratory specimens dur ing the acute phase of infection.  Positive  results are indicative of active infection with SARS-CoV-2.  Clinical  correlation with patient history and other diagnostic information is  necessary to determine patient infection status.  Positive results do  not rule out bacterial infection or co-infection with other viruses. If result is PRESUMPTIVE POSTIVE SARS-CoV-2 nucleic acids MAY BE PRESENT.   A presumptive positive result was obtained on the submitted specimen  and confirmed on repeat testing.  While 2019 novel coronavirus  (SARS-CoV-2) nucleic acids may be present in the submitted sample  additional confirmatory testing may be necessary for epidemiological  and / or clinical management purposes  to differentiate between  SARS-CoV-2 and other Sarbecovirus currently known to infect humans.  If clinically indicated additional testing with an alternate test  methodology [phone]) is advised. The SARS-CoV-2 RNA is generally  detectable in upper and lower respiratory sp ecimens during the acute  phase of infection. The expected result is Negative. Fact Sheet for Patients:  StrictlyIdeas.no Fact Sheet for Healthcare  Providers: BankingDealers.co.za This test is not yet approved or cleared by the [place] FDA and has been authorized for detection and/or diagnosis of SARS-CoV-2 by FDA under an Emergency Use Authorization (EUA).  This EUA will remain in effect (meaning this test can be used) for the duration of the COVID-19 declaration under Section 564(b)(1) of the Act, 21 U.S.C. section 360bbb-3(b)(1), unless the authorization is terminated or revoked sooner. Performed at [facility], [place] [address] [place], [place] [address]          Radiology Studies: No results found.      Scheduled Meds:  fluticasone furoate-vilanterol  1 puff Inhalation Daily   And   umeclidinium bromide  1 puff Inhalation Daily   furosemide  40 mg Oral BID   heparin  5,000 Units Subcutaneous Q8H   insulin aspart  0-20 Units Subcutaneous TID WC   insulin aspart  0-5 Units Subcutaneous QHS   insulin glargine  40 Units Subcutaneous QHS  ipratropium-albuterol  3 mL Nebulization TID   mouth rinse  15 mL Mouth Rinse BID   nystatin  5 mL Oral QID   pantoprazole  40 mg Oral Daily   PARoxetine  40 mg Oral Daily   [START ON 08/31/2018] predniSONE  20 mg Oral Q breakfast   sodium chloride flush  3 mL Intravenous Q12H   sodium zirconium cyclosilicate  10 g Oral Once   Continuous Infusions:   LOS: 4 days    Time spent: 36 minutes spent on chart review, personally reviewed all imaging and lab tests, discussion with nursing staff, consultants, updating family and interview/physical exam; more than 50% of that time was spent in counseling and/or coordination of care.    Desmond Szabo J [place] DO [facility] Pager [phone]  If 7PM-7AM, please contact night-coverage [email or web address] Password [email or web address] 08/30/2018, 11:16 AM

## 2018-08-30 NOTE — Progress Notes (Signed)
Patient is on NIV QHS at this time, tolerating it fairly well, currently no distress or complications noted.

## 2018-08-31 ENCOUNTER — Encounter (HOSPITAL_COMMUNITY): Payer: Self-pay

## 2018-08-31 ENCOUNTER — Telehealth: Payer: Self-pay | Admitting: *Deleted

## 2018-08-31 LAB — BASIC METABOLIC PANEL
Anion gap: 12 (ref 5–15)
BUN: 28 mg/dL — ABNORMAL HIGH (ref 8–23)
CO2: 37 mmol/L — ABNORMAL HIGH (ref 22–32)
Calcium: 8.8 mg/dL — ABNORMAL LOW (ref 8.9–10.3)
Chloride: 92 mmol/L — ABNORMAL LOW (ref 98–111)
Creatinine, Ser: 1.04 mg/dL — ABNORMAL HIGH (ref 0.44–1.00)
GFR calc Af Amer: >60 mL/min (ref >60)
GFR calc non Af Amer: 54 mL/min — ABNORMAL LOW (ref >60)
Glucose, Bld: 125 mg/dL — ABNORMAL HIGH (ref 70–99)
Potassium: 2.9 mmol/L — ABNORMAL LOW (ref 3.5–5.1)
Sodium: 141 mmol/L (ref 135–145)

## 2018-08-31 LAB — GLUCOSE, CAPILLARY
Glucose-Capillary: 80 mg/dL (ref 70–99)
Glucose-Capillary: 109 mg/dL — ABNORMAL HIGH (ref 70–99)
Glucose-Capillary: 246 mg/dL — ABNORMAL HIGH (ref 70–99)
Glucose-Capillary: 184 mg/dL — ABNORMAL HIGH (ref 70–99)

## 2018-08-31 LAB — COOXEMETRY PANEL
Carboxyhemoglobin: 1.5 % (ref 0.5–1.5)
Methemoglobin: 0.8 % (ref 0.0–1.5)
O2 Saturation: 55 %
Total hemoglobin: 10.4 g/dL — ABNORMAL LOW (ref 12.0–16.0)

## 2018-08-31 LAB — MAGNESIUM: Magnesium: 2.2 mg/dL (ref 1.7–2.4)

## 2018-08-31 MED ORDER — POTASSIUM CHLORIDE CRYS ER 20 MEQ PO TBCR
30.0000 meq | EXTENDED_RELEASE_TABLET | ORAL | Status: AC
  Administered 2018-08-31 (×3): 30 meq via ORAL
  Filled 2018-08-31 (×3): qty 1

## 2018-08-31 NOTE — Telephone Encounter (Signed)
Pt is returning call.  Currently in the hospital.  [phone].

## 2018-08-31 NOTE — Progress Notes (Signed)
Pt is off the BIPAP and back to the high flow O2 at 8l

## 2018-08-31 NOTE — Plan of Care (Signed)
Medical necessity for Trilogy Ventilator  [name] is a 73 year-old female with end-stage COPD and chronic respiratory failure on chronic steroid therapy; a severe life-threatening disease state.  Patient continues to exhibit signs of hypercapnia associated with chronic respiratory failure secondary to this underlying severe COPD.  Patient requires the use of NIV both nightly and daytime to help with exacerbation periods.  The use of NIV will treat the patient's high PCO2 levels and can reduce risk of exacerbations in future hospitalizations when used at night and during the day.  Patient will need these advanced settings in conjunction with her current medication regimen; BiPAP is not an option due to his functional limitations and the severity of her patient's condition.  Failure to have NIV available for use over a 24-hour period could lead to death.  Patient is able to protect her airway and clear secretions on their own.  [name] [place] DO [facility] Pgr: [phone]

## 2018-08-31 NOTE — Telephone Encounter (Signed)
Received a call from patient, who is still in the hospital. She is in the process of discharge planning and wanted information on the benefits of going to a Rehab facility vs returning back to her home. Benefits discussed in length. She is appreciative of information and will let me know what she decides.

## 2018-08-31 NOTE — TOC Initial Note (Signed)
Transition of Care [facility]) - Initial/Assessment Note    Patient Details  Name: [name] MRN: [id number] Date of Birth: [birth year]  Transition of Care [facility]) CM/SW Contact:    Vinie Sill, [place] Phone Number: 08/31/2018, 3:40 PM  Clinical Narrative:                  CSW visit with the patient at bedside, she was alert and oriented. CSW explained role and reason for consult. CSW spoke with patient concerning possibility of rehab at [facility] before returning home. Patient states she lives alone and was reluctant to SNF due to Covid  at first but later decided it was best she go to a SNF. Patient states she has no SNF preference. Patient gave CSW permission to fax out referral. Patient states no questions or concerns at this time.  Thurmond Butts, MSW, [facility] Clinical Social Worker [phone]   Expected Discharge Plan: Skilled Nursing Facility Barriers to Discharge: SNF Pending bed offer, Continued Medical Work up   Patient Goals and CMS Choice Patient states their goals for this hospitalization and ongoing recovery are:: to go home CMS Medicare.gov Compare Post Acute Care list provided to:: Patient Choice offered to / list presented to : Patient  Expected Discharge Plan and Services Expected Discharge Plan: [place] In-house Referral: Clinical Social Work     Living arrangements for the past 2 months: [place]                                      Prior Living Arrangements/Services Living arrangements for the past 2 months: Single Family Home Lives with:: Self Patient language and need for interpreter reviewed:: Yes Do you feel safe going back to the place where you live?: Yes      Need for Family Participation in Patient Care: Yes (Comment) Care giver support system in place?: Yes (comment)   Criminal Activity/Legal Involvement Pertinent to Current Situation/Hospitalization: No - Comment as needed  Activities of Daily Living Home  Assistive Devices/Equipment: Walker (specify type) ADL Screening (condition at time of admission) Patient's cognitive ability adequate to safely complete daily activities?: Yes Is the patient deaf or have difficulty hearing?: No Does the patient have difficulty seeing, even when wearing glasses/contacts?: No Does the patient have difficulty concentrating, remembering, or making decisions?: No Patient able to express need for assistance with ADLs?: Yes Does the patient have difficulty dressing or bathing?: No Independently performs ADLs?: Yes (appropriate for developmental age) Does the patient have difficulty walking or climbing stairs?: Yes Weakness of Legs: Both Weakness of Arms/Hands: None  Permission Sought/Granted Permission sought to share information with : Family Supports Permission granted to share information with : Yes, Verbal Permission Granted  Share Information with NAME: Lanice Schwab  Permission granted to share info w AGENCY: SNFs  Permission granted to share info w Relationship: son  Permission granted to share info w Contact Information: [phone]  Emotional Assessment Appearance:: Appears stated age Attitude/Demeanor/Rapport: Engaged Affect (typically observed): Accepting, Appropriate, Apprehensive, Pleasant Orientation: : Oriented to Self, Oriented to Place, Oriented to  Time, Oriented to Situation Alcohol / Substance Use: Not Applicable Psych Involvement: No (comment)  Admission diagnosis:  Shortness of breath [R06.02] COPD exacerbation (HCC) [J44.1] Acute respiratory failure with hypoxia (HCC) [J96.01] Acute on chronic congestive heart failure, unspecified heart failure type [facility]) [I50.9] Patient Active Problem List   Diagnosis Date Noted  .  Acute on chronic respiratory failure with hypoxia ([place]) 08/26/2018  . Hyperkalemia 08/26/2018  . COPD with acute exacerbation ([place]) 01/26/2018  . Psoriasis 07/01/2017  . Depression 06/24/2016  . Pneumonia of  both lungs due to infectious organism   . Chronic respiratory failure with hypoxia ([place])   . Pulmonary hypertension ([place])   . Physical deconditioning 11/15/2015  . GERD (gastroesophageal reflux disease) 11/01/2015  . 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 Diabetes ([place]) 01/23/2015  . Fatigue 01/20/2015  . Chronic obstructive pulmonary disease ([place]) 10/18/2014  . Obesity, unspecified 03/15/2013  . Acute on chronic diastolic CHF (congestive heart failure) ([place]) 03/14/2013  . Chronic respiratory failure ([place]) 06/30/2012  . COPD (chronic obstructive pulmonary disease) ([place])   . Tobacco abuse   . Pulmonary nodule 03/22/2010  . 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 Bronchiectasis 03/22/2010  . Hyperlipidemia 06/13/2008  . Anxiety 02/20/2007  . MITRAL VALVE PROLAPSE 02/20/2007  . CERVICAL CANCER, HX OF 02/20/2007   PCP:  Binnie Rail, MD Pharmacy:   [id number] - [place], [place] [address] Phone: [phone] Fax: [phone]     Social Determinants of Health (SDOH) Interventions    Readmission Risk Interventions No flowsheet data found.

## 2018-08-31 NOTE — NC FL2 (Signed)
[place] LEVEL OF CARE SCREENING TOOL     IDENTIFICATION  Patient Name: [name] Birthdate: 02-20-1945 Sex: female Admission Date (Current Location): 08/26/2018  [facility] and [place] Number:  Herbalist and Address:  [facility], [place] [address], [place], [place] [address]      Provider Number: [id number]  Attending Physician Name and Address:  [place] Eric J, DO  Relative Name and Phone Number:  Lanice Schwab  ([place] [phone]    Current Level of Care: Hospital Recommended Level of Care: [place] Prior Approval Number:    Date Approved/Denied:   PASRR Number: [id number]  Discharge Plan: SNF    Current Diagnoses: Patient Active Problem List   Diagnosis Date Noted  . Acute on chronic respiratory failure with hypoxia ([place]) 08/26/2018  . Hyperkalemia 08/26/2018  . COPD with acute exacerbation ([place]) 01/26/2018  . Psoriasis 07/01/2017  . Depression 06/24/2016  . Pneumonia of both lungs due to infectious organism   . Chronic respiratory failure with hypoxia ([place])   . Pulmonary hypertension ([place])   . Physical deconditioning 11/15/2015  . GERD (gastroesophageal reflux disease) 11/01/2015  . 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 Diabetes ([place]) 01/23/2015  . Fatigue 01/20/2015  . Chronic obstructive pulmonary disease ([place]) 10/18/2014  . Obesity, unspecified 03/15/2013  . Acute on chronic diastolic CHF (congestive heart failure) ([place]) 03/14/2013  . Chronic respiratory failure ([place]) 06/30/2012  . COPD (chronic obstructive pulmonary disease) ([place])   . Tobacco abuse   . Pulmonary nodule 03/22/2010  . 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 Bronchiectasis 03/22/2010  . Hyperlipidemia 06/13/2008  . Anxiety 02/20/2007  . MITRAL VALVE PROLAPSE 02/20/2007  . CERVICAL CANCER, HX OF 02/20/2007    Orientation RESPIRATION BLADDER Height & Weight     Self, Time, Situation  O2(8L HFNC , Bi  -pap adult, large, full face mask) External catheter Weight: 216 lb 11.4 oz (98.3 kg) Height:  5\' 3"  (160 cm)  BEHAVIORAL SYMPTOMS/MOOD NEUROLOGICAL BOWEL NUTRITION STATUS      Continent Diet(please see discharge summary)  AMBULATORY STATUS COMMUNICATION OF NEEDS Skin     Verbally Surgical wounds(pressure injury,buttocks,right. medical stage II)                       Personal Care Assistance Level of Assistance  Bathing, Feeding, Dressing Bathing Assistance: Limited assistance Feeding assistance: Independent Dressing Assistance: Limited assistance     Functional Limitations Info  Sight, Hearing, Speech Sight Info: Adequate Hearing Info: Adequate Speech Info: Adequate    SPECIAL CARE FACTORS FREQUENCY  PT (By licensed PT), OT (By licensed OT)     PT Frequency: 3x per week OT Frequency: 3x per week            Contractures Contractures Info: Not present    Additional Factors Info  Code Status, Allergies, Isolation Precautions, Insulin Sliding Scale, Psychotropic Code Status Info: Full Code Allergies Info: Ambien Psychotropic Info: ALPRAZolam (XANAX) tablet 0.5 mg Insulin Sliding Scale Info: insulin aspart (novoLOG) injection 0-20 Units , 3x daily w/meals,insulin aspart (novoLOG) injection 0-5 Units , daily at bedtime,insulin glargine (LANTUS) injection 40 Units,dialy at bedtime Isolation Precautions Info: MRSA     Current Medications (08/31/2018):  This is the current hospital active medication list Current Facility-Administered Medications  Medication Dose Route Frequency Provider Last Rate Last Dose  . acetaminophen (TYLENOL) tablet 650 mg  650 mg Oral Q6H PRN Lenore Cordia, MD  650 mg at 08/29/18 0537   Or  . acetaminophen (TYLENOL) suppository 650 mg  650 mg Rectal Q6H PRN Zada Finders R, MD      . albuterol (PROVENTIL) (2.5 MG/3ML) 0.083% nebulizer solution 2.5 mg  2.5 mg Nebulization Q2H PRN Lenore Cordia, MD      . ALPRAZolam Duanne Moron) tablet 0.5 mg  0.5  mg Oral TID PRN Zada Finders R, MD   0.5 mg at 08/31/18 1504  . fluticasone furoate-vilanterol (BREO ELLIPTA) 100-25 MCG/INH 1 puff  1 puff Inhalation Daily Zada Finders R, MD   1 puff at 08/31/18 0832   And  . umeclidinium bromide (INCRUSE ELLIPTA) 62.5 MCG/INH 1 puff  1 puff Inhalation Daily Lenore Cordia, MD   1 puff at 08/31/18 [phone]  . furosemide (LASIX) tablet 40 mg  40 mg Oral BID Candee Furbish, MD   40 mg at 08/31/18 0903  . heparin injection 5,000 Units  5,000 Units Subcutaneous Q8H Lenore Cordia, MD   5,000 Units at 08/31/18 1458  . insulin aspart (novoLOG) injection 0-20 Units  0-20 Units Subcutaneous TID WC Lenore Cordia, MD   11 Units at 08/30/18 1700  . insulin aspart (novoLOG) injection 0-5 Units  0-5 Units Subcutaneous QHS Lenore Cordia, MD   2 Units at 08/29/18 2215  . insulin glargine (LANTUS) injection 40 Units  40 Units Subcutaneous QHS Barb Merino, MD   40 Units at 08/30/18 2146  . ipratropium-albuterol (DUONEB) 0.5-2.5 (3) MG/3ML nebulizer solution 3 mL  3 mL Nebulization TID Barb Merino, MD   3 mL at 08/31/18 1301  . MEDLINE mouth rinse  15 mL Mouth Rinse BID Barb Merino, MD   15 mL at 08/31/18 0904  . nystatin (MYCOSTATIN) 100000 UNIT/ML suspension 500,000 Units  5 mL Oral Daily [place] Eric J, DO   500,000 Units at 08/31/18 6754  . pantoprazole (PROTONIX) EC tablet 40 mg  40 mg Oral Daily Lenore Cordia, MD   40 mg at 08/31/18 0903  . PARoxetine (PAXIL) tablet 40 mg  40 mg Oral Daily Lenore Cordia, MD   40 mg at 08/31/18 0904  . potassium chloride (K-DUR) CR tablet 30 mEq  30 mEq Oral Q3H [place] Eric J, DO   30 mEq at 08/31/18 1458  . predniSONE (DELTASONE) tablet 20 mg  20 mg Oral Q breakfast [place] Eric J, DO   20 mg at 08/31/18 4920  . senna-docusate (Senokot-S) tablet 1 tablet  1 tablet Oral QHS PRN Zada Finders R, MD      . sodium chloride flush (NS) 0.9 % injection 3 mL  3 mL Intravenous Q12H Lenore Cordia, MD   3 mL at 08/31/18 0904  .  sodium zirconium cyclosilicate (LOKELMA) packet 10 g  10 g Oral Once Lenore Cordia, MD   Stopped at 08/26/18 1926     Discharge Medications: Please see discharge summary for a list of discharge medications.  Relevant Imaging Results:  Relevant Lab Results:   Additional Information SSN [id number]  Vinie Sill, LCSWA

## 2018-08-31 NOTE — Telephone Encounter (Signed)
When pt gets out of the hospital, we will call her to get her scheduled for a visit.

## 2018-08-31 NOTE — Telephone Encounter (Signed)
ATC patient is still currently admitted.

## 2018-08-31 NOTE — Progress Notes (Signed)
RT attempted to turn patient off of O2 per MD order to obtain an ABG on room air. Patient was unable to tolerate for more than 3 minutes. Her O2 saturation dropped to 70 while RT was attempting to obtain a ABG. RT had to stick patient twice. In between sticks RT turned patient's O2 back on and then back off before the second attempt. Patient was in pain and very short of breath during all this. RT only received a mixed venous sample not arterial. MD was notified.   The mixed venous results are as follows: PH 7.512, PO2 undetectable, PCO2 50.4, HCO3: 40.1

## 2018-08-31 NOTE — Progress Notes (Signed)
Inpatient Diabetes Program Recommendations  AACE/ADA: New Consensus Statement on Inpatient Glycemic Control (2015)  Target Ranges:  Prepandial:   less than 140 mg/dL      Peak postprandial:   less than 180 mg/dL (1-2 hours)      Critically ill patients:  140 - 180 mg/dL   Lab Results  Component Value Date   GLUCAP 80 08/31/2018   HGBA1C 9.1 (H) 08/26/2018    Review of Glycemic Control Results for [name], [name] (MRN [id number]) as of 08/31/2018 10:49  Ref. Range 08/30/2018 11:16 08/30/2018 16:25 08/30/2018 21:46 08/31/2018 06:18  Glucose-Capillary Latest Ref Range: 70 - 99 mg/dL 174 (H) 281 (H) 172 (H) 80   Diabetes history: Type 2 DM Current orders for Inpatient glycemic control: Novolog 0-20 units TID, Novolog 0-5 units QHS, Lantus 40 units QHS Prednisone 20 mg QAM  Inpatient Diabetes Program Recommendations:    If patient to remain inpatient, could consider adding Novolog 3 units TID (asusming patient is consuming >50% of meal).   Thanks, Bronson Curb, MSN, RNC-OB Diabetes Coordinator [phone] (8a-5p)

## 2018-08-31 NOTE — Progress Notes (Signed)
Physical Therapy Treatment Patient Details Name: [name] MRN: [id number] DOB: May 04, 1945 Today's Date: 08/31/2018    History of Present Illness Patient is a 73 year old female admitted with dyspnea. She is on 5 lpm O2 at home, has COPD, CHF, DM, HTN, HLD, anxiety.    PT Comments    Pt supine on arrival on 7L HFNC with SpO2 93% with transition to EOB drop to 91% and with stand pivot transfer decline to 88%. Pt increased to 8L with pt dropping to 78% with very limited gait in room. Pt able to perform pericare at commode but noted fatigue with limited movement and transfers and educated for need for [facility] due to inability to perform even the most basic activity without desaturation and fatigue. Pt prefers to return home but demonstrated recognition of her deficits and current inability to be home alone. Pt with bed soaked in urine on arrival and pt unaware.  HR 111 with limited 11' of gait in room  Pt encouraged to use BSC every hour to prevent urgent need to void as well as skin breakdown, nurse tech present and aware.    Follow Up Recommendations  SNF     Equipment Recommendations  None recommended by PT    Recommendations for Other Services       Precautions / Restrictions Precautions Precautions: Fall Precaution Comments: watch sats Restrictions Weight Bearing Restrictions: No    Mobility  Bed Mobility Overal bed mobility: Needs Assistance Bed Mobility: Supine to Sit     Supine to sit: Supervision     General bed mobility comments: supervision of lines, exiting to left with HOB 20 degrees and use of rail  Transfers Overall transfer level: Needs assistance   Transfers: Sit to/from Stand;Stand Pivot Transfers Sit to Stand: Min guard Stand pivot transfers: Min guard       General transfer comment: cues for hand placement and safety with pt able to stand from bed and pivot to [facility]  Ambulation/Gait Ambulation/Gait assistance: Min guard Gait Distance (Feet): 11  Feet Assistive device: Rolling walker (2 wheeled) Gait Pattern/deviations: Step-through pattern;Decreased stride length;Trunk flexed   Gait velocity interpretation: 1.31 - 2.62 ft/sec, indicative of limited community ambulator General Gait Details: gait distance limited by fatigue with desaturation with mobility. Pt on 8L dropping to 78% with only 11' of gait with use of Rw and cues for posture and safety   Stairs             Wheelchair Mobility    Modified Rankin (Stroke Patients Only)       Balance Overall balance assessment: Needs assistance Sitting-balance support: Feet supported Sitting balance-Leahy Scale: Good Sitting balance - Comments: pt able to sit EOB and at [facility] without support   Standing balance support: Bilateral upper extremity supported Standing balance-Leahy Scale: Poor Standing balance comment: pt requires single to bil UE support for standing balance                            Cognition Arousal/Alertness: Awake/alert Behavior During Therapy: Flat affect Overall Cognitive Status: Impaired/Different from baseline Area of Impairment: Safety/judgement;Problem solving                         Safety/Judgement: Decreased awareness of deficits;Decreased awareness of safety   Problem Solving: Slow processing General Comments: pt incontinent of urine on arrival and unaware. Pt lacks awareness of deficits and how she would function at  home      Exercises      General Comments        Pertinent Vitals/Pain Pain Assessment: No/denies pain    Home Living Family/patient expects to be discharged to:: Skilled nursing facility Living Arrangements: Alone                  Prior Function            PT Goals (current goals can now be found in the care plan section) Progress towards PT goals: Progressing toward goals    Frequency    Min 2X/week      PT Plan Current plan remains appropriate    Co-evaluation               AM-PAC PT "6 Clicks" Mobility   Outcome Measure  Help needed turning from your back to your side while in a flat bed without using bedrails?: A Little Help needed moving from lying on your back to sitting on the side of a flat bed without using bedrails?: A Little Help needed moving to and from a bed to a chair (including a wheelchair)?: A Little Help needed standing up from a chair using your arms (e.g., wheelchair or bedside chair)?: A Little Help needed to walk in hospital room?: A Lot Help needed climbing 3-5 steps with a railing? : Total 6 Click Score: 15    End of Session Equipment Utilized During Treatment: Gait belt;Oxygen Activity Tolerance: Patient limited by fatigue;Other (comment)(limited cardiopulmonary function) Patient left: in chair;with call bell/phone within reach;with nursing/sitter in room Nurse Communication: Mobility status PT Visit Diagnosis: Unsteadiness on feet (R26.81);Muscle weakness (generalized) (M62.81);Difficulty in walking, not elsewhere classified (R26.2);History of falling (Z91.81)     Time: 4010-2725 PT Time Calculation (min) (ACUTE ONLY): 30 min  Charges:  $Gait Training: 8-22 mins $Therapeutic Activity: 8-22 mins                     [name], PT Acute Rehabilitation Services Pager: [phone] Office: Edith [place]ave 08/31/2018, 1:21 PM

## 2018-08-31 NOTE — Progress Notes (Signed)
PROGRESS NOTE    [name]  ELF:[id number] DOB: 1945-07-10 DOA: 08/26/2018 PCP: Binnie Rail, MD    Brief Narrative:  [name] is a 73 y.o. female with medical history significant for COPD with chronic respiratory failure on 6 to 7 L of oxygen at home, chronic prednisone therapy, chronic diastolic congestive heart failure, insulin-dependent diabetes, hypertension, hyperlipidemia, anxiety, ongoing smoker, end-stage COPD presenting to the emergency room with chronic shortness of breath worse for the last few days.  In the emergency room, hemodynamically stable.  92% on nonrebreather on 10 L.  WBC 15.6.  Otherwise fairly stable.  Patient was found hypercapnic with 7.23, PCO2 72.5.  COVID-19 negative.  Chest x-ray showed enlarged cardiac shadow and pulmonary edema which is probably chronic.  She was admitted to hospital due to severity of symptoms.   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  Patient was noted to be hypercapnic with a PCO2 of 72.5 on ABG.  Chest x-ray showed enlarged cardiac silhouette with mild pulmonary edema.  Patient was started on aggressive bronchodilator therapy, IV steroids and inhaled steroids. --Continue steroid taper; prednisone 40 mg p.o. QD x 3 days, then resume home dose of 20 mg p.o. daily --Continue bronchodilators, incentive spirometry, chest physiotherapy --On 8 L HFNC this am; oxygenating 98%, O2 turned down to 7L (home 6-7L); goal SpO2 88% --Continue oral furosemide 40 mg p.o. twice daily --Awaiting Trilogy  vent per case management prior to discharge --Patient to follow-up with pulmonary 2 weeks with Dr. [name] or [facility].  Hyperkalemia:  Resolved K 5.6 on admission, received dose of Lokelma. --Potassium 4.1 today.  Insulin-dependent diabetes with hyperglycemia:  Hemoglobin A1c 9.1 on 08/26/2018.  Home regimen includes  Lantus 22u qHS. --Continue Lantus 40 units [place] qHS for now; hyperglycemia likely complicated by steroids --Continue NovoLog insulin sliding scale for coverage  GERD: continue PPI  Anxiety/depression:  --continue paroxetine and Xanax  Acute on chronic diastolic congestive heart failure Patient presenting with respiratory distress as above.  Chest x-ray notable for cardiomegaly with pulmonary vascular congestion.  Transthoracic echo 08/27/2018 notable for EF 58-52% with diastolic dysfunction and mild LVH.  Initially diuresed with IV furosemide and transition to furosemide 40 mg p.o. twice daily.  --Net positive 455mL past 24h, with net negative fluid balance of 2.0 L this hospitalization --wt 105-->102.5-->97.5-->98.3Kg --Continue furosemide 40 mg p.o. twice daily --Strict I's and O's and daily weights  Hypokalemia Potassium 2.9 this morning, magnesium 2.0.  Will replete --Repeat electrolytes in the a.m. to include magnesium  Continued tobacco abuse Patient continues to smoke.  Discussed with patient need for complete cessation given her end-stage COPD that requires supplemental oxygen.  Weakness/debility PT recommends SNF.  Patient initially wanted to go home with home health, but now reconsidering SNF placement. --Social work for coordination   DVT prophylaxis: Heparin Code Status: Full code Family Communication: None Disposition Plan: Pending receipt of trilogy vent, PT recommends SNF and patient now agreeable, social work for coordination   Consultants:   PCCM - signed off 8/7  Procedures:   None  Antimicrobials:   None   Subjective: Patient seen  and examined at bedside, sleeping and easily arousable.  Continues on high flow nasal cannula, on 8 L this morning; oxygenating 98%.  Patient more amenable for SNF placement due to her significant weakness.  Denies headache, no fever/chills/night sweats, no nausea/vomit/diarrhea, no chest pain, no palpitations, no shortness of breath that is greater than her normal baseline, no abdominal pain, no paresthesias.  No acute events overnight per nursing staff.  Objective: Vitals:   08/30/18 2323 08/31/18 0348 08/31/18 0600 08/31/18 0831  BP:  96/62    Pulse: (!) 104 85    Resp: (!) 22 (!) 21    Temp:  98.9 F (37.2 C)    TempSrc:  Axillary    SpO2: 99% 98%  96%  Weight:   98.3 kg   Height:   5\' 3"  (1.6 m)     Intake/Output Summary (Last 24 hours) at 08/31/2018 1020 Last data filed at 08/30/2018 1559 Gross per 24 hour  Intake 420 ml  Output 300 ml  Net 120 ml   Filed Weights   08/29/18 0448 08/30/18 0458 08/31/18 0600  Weight: 102.5 kg 97.5 kg 98.3 kg    Examination:  General exam: Appears calm and comfortable  Respiratory system: Diffuse mid to late expiratory wheezing bilaterally, Respiratory effort normal.,  On 8 L HFNC with SPO2 98%. Cardiovascular system: S1 & S2 heard, RRR. No JVD, murmurs, rubs, gallops or clicks. No pedal edema. Gastrointestinal system: Abdomen is nondistended, soft and nontender. No organomegaly or masses felt. Normal bowel sounds heard. Central nervous system: Alert and oriented. No focal neurological deficits. Extremities: Symmetric 5 x 5 power. Skin: No rashes, lesions or ulcers Psychiatry: Judgement and insight appear normal. Mood & affect appropriate.     Data Reviewed: I have personally reviewed following labs and imaging studies  CBC: Recent Labs  Lab 08/26/18 1633 08/26/18 1846 08/27/18 0250 08/29/18 0240  WBC 15.6*  --  18.0* 14.0*  NEUTROABS 13.3*  --   --  11.2*  HGB 10.1* 12.2 9.6* 9.4*  HCT 40.0 36.0 41.4 35.7*  MCV 82.3  --  89.8  80.0  PLT 563*  --  525* 387*   Basic Metabolic Panel: Recent Labs  Lab 08/26/18 1633 08/26/18 1846 08/26/18 2148 08/27/18 0250 08/29/18 0240 08/31/18 0320  NA 138 139 141 142 142 141  K 5.4* 5.6* 5.2* 5.0 4.1 2.9*  CL 104  --  102 100 96* 92*  CO2 24  --  28 29 36* 37*  GLUCOSE 416*  --  464* 367* 210* 125*  BUN 25*  --  26* 27* 26* 28*  CREATININE 1.31*  --  1.22* 1.37* 1.08* 1.04*  CALCIUM 9.0  --  9.2 8.9 8.7* 8.8*  MG  --   --   --   --   --  2.2   GFR: Estimated Creatinine Clearance: 55.5 mL/min (A) (by C-G formula based on SCr of 1.04 mg/dL (H)). Liver Function Tests: No results for input(s): AST, ALT, ALKPHOS, BILITOT, PROT, ALBUMIN in the last 168 hours. No results for input(s): LIPASE, AMYLASE in the last 168 hours. No results for input(s): AMMONIA in the last 168 hours. Coagulation Profile: No results for input(s): INR, PROTIME in the last 168 hours. Cardiac Enzymes: No results for input(s): CKTOTAL, CKMB, CKMBINDEX, TROPONINI in the last 168 hours. BNP (last 3 results) No results for input(s): PROBNP in the last 8760 hours. HbA1C: No results for input(s): HGBA1C  in the last 72 hours. CBG: Recent Labs  Lab 08/30/18 0638 08/30/18 1116 08/30/18 1625 08/30/18 2146 08/31/18 0618  GLUCAP 93 174* 281* 172* 80   Lipid Profile: No results for input(s): CHOL, HDL, LDLCALC, TRIG, CHOLHDL, LDLDIRECT in the last 72 hours. Thyroid Function Tests: No results for input(s): TSH, T4TOTAL, FREET4, T3FREE, THYROIDAB in the last 72 hours. Anemia Panel: No results for input(s): VITAMINB12, FOLATE, FERRITIN, TIBC, IRON, RETICCTPCT in the last 72 hours. Sepsis Labs: No results for input(s): PROCALCITON, LATICACIDVEN in the last 168 hours.  Recent Results (from the past 240 hour(s))  SARS Coronavirus 2 [facility] order, Performed in [facility] hospital lab) Nasopharyngeal Nasopharyngeal Swab     Status: None   Collection Time: 08/26/18  4:24 PM   Specimen: Nasopharyngeal  Swab  Result Value Ref Range Status   SARS Coronavirus 2 NEGATIVE NEGATIVE Final    Comment: (NOTE) If result is NEGATIVE SARS-CoV-2 target nucleic acids are NOT DETECTED. The SARS-CoV-2 RNA is generally detectable in upper and lower  respiratory specimens during the acute phase of infection. The lowest  concentration of SARS-CoV-2 viral copies this assay can detect is 250  copies / mL. A negative result does not preclude SARS-CoV-2 infection  and should not be used as the sole basis for treatment or other  patient management decisions.  A negative result may occur with  improper specimen collection / handling, submission of specimen other  than nasopharyngeal swab, presence of viral mutation(s) within the  areas targeted by this assay, and inadequate number of viral copies  (<250 copies / mL). A negative result must be combined with clinical  observations, patient history, and epidemiological information. If result is POSITIVE SARS-CoV-2 target nucleic acids are DETECTED. The SARS-CoV-2 RNA is generally detectable in upper and lower  respiratory specimens dur ing the acute phase of infection.  Positive  results are indicative of active infection with SARS-CoV-2.  Clinical  correlation with patient history and other diagnostic information is  necessary to determine patient infection status.  Positive results do  not rule out bacterial infection or co-infection with other viruses. If result is PRESUMPTIVE POSTIVE SARS-CoV-2 nucleic acids MAY BE PRESENT.   A presumptive positive result was obtained on the submitted specimen  and confirmed on repeat testing.  While 2019 novel coronavirus  (SARS-CoV-2) nucleic acids may be present in the submitted sample  additional confirmatory testing may be necessary for epidemiological  and / or clinical management purposes  to differentiate between  SARS-CoV-2 and other Sarbecovirus currently known to infect humans.  If clinically indicated  additional testing with an alternate test  methodology [phone]) is advised. The SARS-CoV-2 RNA is generally  detectable in upper and lower respiratory sp ecimens during the acute  phase of infection. The expected result is Negative. Fact Sheet for Patients:  StrictlyIdeas.no Fact Sheet for Healthcare Providers: BankingDealers.co.za This test is not yet approved or cleared by the [place] FDA and has been authorized for detection and/or diagnosis of SARS-CoV-2 by FDA under an Emergency Use Authorization (EUA).  This EUA will remain in effect (meaning this test can be used) for the duration of the COVID-19 declaration under Section 564(b)(1) of the Act, 21 U.S.C. section 360bbb-3(b)(1), unless the authorization is terminated or revoked sooner. Performed at [facility], [place] [address] [place], [place] [address]          Radiology Studies: No results found.      Scheduled Meds: . fluticasone furoate-vilanterol  1  puff Inhalation Daily   And  . umeclidinium bromide  1 puff Inhalation Daily  . furosemide  40 mg Oral BID  . heparin  5,000 Units Subcutaneous Q8H  . insulin aspart  0-20 Units Subcutaneous TID WC  . insulin aspart  0-5 Units Subcutaneous QHS  . insulin glargine  40 Units Subcutaneous QHS  . ipratropium-albuterol  3 mL Nebulization TID  . mouth rinse  15 mL Mouth Rinse BID  . nystatin  5 mL Oral Daily  . pantoprazole  40 mg Oral Daily  . PARoxetine  40 mg Oral Daily  . predniSONE  20 mg Oral Q breakfast  . sodium chloride flush  3 mL Intravenous Q12H  . sodium zirconium cyclosilicate  10 g Oral Once   Continuous Infusions:   LOS: 5 days    Time spent: 36 minutes spent on chart review, personally reviewed all imaging and lab tests, discussion with nursing staff, consultants, updating family and interview/physical exam; more than 50% of that time was spent in counseling and/or coordination of care.      J [place] DO [facility] Pager [phone]  If 7PM-7AM, please contact night-coverage [email or web address] Password [email or web address] 08/31/2018, 10:20 AM

## 2018-08-31 NOTE — Telephone Encounter (Signed)
Pt is still currently admitted in hospital. 

## 2018-09-01 ENCOUNTER — Inpatient Hospital Stay (HOSPITAL_COMMUNITY): Payer: Medicare Other

## 2018-09-01 ENCOUNTER — Other Ambulatory Visit (HOSPITAL_COMMUNITY): Payer: Self-pay | Admitting: Respiratory Therapy

## 2018-09-01 LAB — BASIC METABOLIC PANEL
Anion gap: 13 (ref 5–15)
BUN: 24 mg/dL — ABNORMAL HIGH (ref 8–23)
CO2: 33 mmol/L — ABNORMAL HIGH (ref 22–32)
Calcium: 8.6 mg/dL — ABNORMAL LOW (ref 8.9–10.3)
Chloride: 97 mmol/L — ABNORMAL LOW (ref 98–111)
Creatinine, Ser: 0.91 mg/dL (ref 0.44–1.00)
GFR calc Af Amer: >60 mL/min (ref >60)
GFR calc non Af Amer: >60 mL/min (ref >60)
Glucose, Bld: 108 mg/dL — ABNORMAL HIGH (ref 70–99)
Potassium: 3.8 mmol/L (ref 3.5–5.1)
Sodium: 143 mmol/L (ref 135–145)

## 2018-09-01 LAB — PULMONARY FUNCTION TEST
FEF 25-75 Pre: 0.28 L/sec
FEF2575-%Pred-Pre: 17 %
FEV1-%Pred-Pre: 39 %
FEV1-Pre: 0.69 L
FEV1FVC-%Pred-Pre: 61 %
FEV6-%Pred-Pre: 66 %
FEV6-Pre: 1.43 L
FEV6FVC-%Pred-Pre: 102 %
FVC-%Pred-Pre: 64 %
FVC-Pre: 1.46 L
Pre FEV1/FVC ratio: 47 %
Pre FEV6/FVC Ratio: 98 %

## 2018-09-01 LAB — GLUCOSE, CAPILLARY
Glucose-Capillary: 114 mg/dL — ABNORMAL HIGH (ref 70–99)
Glucose-Capillary: 208 mg/dL — ABNORMAL HIGH (ref 70–99)
Glucose-Capillary: 193 mg/dL — ABNORMAL HIGH (ref 70–99)
Glucose-Capillary: 171 mg/dL — ABNORMAL HIGH (ref 70–99)

## 2018-09-01 LAB — MAGNESIUM: Magnesium: 2.2 mg/dL (ref 1.7–2.4)

## 2018-09-01 MED ORDER — SODIUM CHLORIDE 0.9 % IV BOLUS
1000.0000 mL | Freq: Once | INTRAVENOUS | Status: AC
  Administered 2018-09-01: 1000 mL via INTRAVENOUS

## 2018-09-01 MED ORDER — SODIUM CHLORIDE 0.9 % IV BOLUS: 1000.0000 mL | Freq: Once | INTRAVENOUS | Status: DC

## 2018-09-01 NOTE — Progress Notes (Signed)
MD notified that pt is hypotensive.  BPs today have been 80s/50s, otherwise asymptomatic.  Received order for IV fluid bolus.

## 2018-09-01 NOTE — Care Management Important Message (Signed)
Important Message  Patient Details  Name: [name] MRN: [id number] Date of Birth: [birth year]   Medicare Important Message Given:  Yes     Memory Argue 09/01/2018, 11:52 AM

## 2018-09-01 NOTE — Progress Notes (Signed)
Pt IV painful with flushing. IV team order placed for new start to administer the bolus for hypotension. Pt has p.o. lasix ordered for 1800.  Per MD, holding lasix.

## 2018-09-01 NOTE — Progress Notes (Signed)
PROGRESS NOTE    [name]  HBZ:[id number] DOB: Mar 16, 1945 DOA: 08/26/2018 PCP: Binnie Rail, MD    Brief Narrative:  [name] is a 73 y.o. female with medical history significant for COPD with chronic respiratory failure on 6 to 7 L of oxygen at home, chronic prednisone therapy, chronic diastolic congestive heart failure, insulin-dependent diabetes, hypertension, hyperlipidemia, anxiety, ongoing smoker, end-stage COPD presenting to the emergency room with chronic shortness of breath worse for the last few days.  In the emergency room, hemodynamically stable.  92% on nonrebreather on 10 L.  WBC 15.6.  Otherwise fairly stable.  Patient was found hypercapnic with 7.23, PCO2 72.5.  COVID-19 negative.  Chest x-ray showed enlarged cardiac shadow and pulmonary edema which is probably chronic.  She was admitted to hospital due to severity of symptoms.   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  Patient was noted to be hypercapnic with a PCO2 of 72.5 on ABG.  Chest x-ray showed enlarged cardiac silhouette with mild pulmonary edema.  Patient was started on aggressive bronchodilator therapy, IV steroids and inhaled steroids. --Steroids were tapered, now continues on home dose prednisone 20 mg p.o. daily --Continue bronchodilators, incentive spirometry, chest physiotherapy --PFTs 8/11: FEV1 0.69, FVC 1.46, FEV1/FVC: 47 --O2 turned down to 7L this am, SPO2 97% (home 6-7L); goal SpO2 88% --Continue oral furosemide 40 mg p.o. twice daily --Awaiting  Trilogy vent per case management prior to discharge --Patient to follow-up with pulmonary 2 weeks with Dr. [name] or [facility].  Hyperkalemia:  Resolved K 5.6 on admission, received dose of Lokelma. --Potassium 3.8 today.  Insulin-dependent diabetes with hyperglycemia:  Hemoglobin A1c 9.1 on 08/26/2018.  Home regimen includes Lantus 22u qHS. --Continue Lantus 40 units [place] qHS for now; hyperglycemia likely complicated by steroids --Continue NovoLog insulin sliding scale for coverage  GERD: continue PPI  Anxiety/depression:  --continue paroxetine and Xanax  Acute on chronic diastolic congestive heart failure Patient presenting with respiratory distress as above.  Chest x-ray notable for cardiomegaly with pulmonary vascular congestion.  Transthoracic echo 08/27/2018 notable for EF 10-17% with diastolic dysfunction and mild LVH.  Initially diuresed with IV furosemide and transition to furosemide 40 mg p.o. twice daily.  --Net positive 461mL past 24h, with net negative fluid balance of 2.0 L this hospitalization --wt 105-->102.5-->97.5-->98.3-->99Kg --Continue furosemide 40 mg p.o. twice daily --Strict I's and O's and daily weights  Hypokalemia Repleted. Potassium 3.8 this morning, magnesium 2.2.  --Repeat electrolytes in the a.m. to include magnesium  Continued tobacco abuse Patient continues to smoke.  Discussed with patient need for complete cessation given her end-stage COPD that requires supplemental oxygen.  Weakness/debility PT recommends SNF.   --Social work for coordination   DVT prophylaxis: Heparin Code Status: Full code Family Communication: None Disposition Plan: Pending receipt of trilogy vent, PT recommends SNF and patient now agreeable, social work for coordination   Consultants:   PCCM - signed off 8/7  Procedures:   PFT's 8/11  Antimicrobials:   None   Subjective: Patient seen and examined at bedside, sleeping and easily arousable.  Continues on high  flow nasal cannula, on 7 L this morning; oxygenating 97%. No complaints. Denies headache, no fever/chills/night sweats, no nausea/vomit/diarrhea, no chest pain, no palpitations, no shortness of breath that is greater than her normal baseline, no abdominal pain, no paresthesias.  No acute events overnight per nursing staff.  Objective: Vitals:   09/01/18 0828 09/01/18 0829 09/01/18 0928 09/01/18 0931  BP:   (!) 87/56 (!) 84/59  Pulse:      Resp:      Temp:   98 F (36.7 C)   TempSrc:   Oral   SpO2: 99% 99% 97%   Weight:   99 kg   Height:        Intake/Output Summary (Last 24 hours) at 09/01/2018 1318 Last data filed at 09/01/2018 0614 Gross per 24 hour  Intake --  Output 300 ml  Net -300 ml   Filed Weights   08/30/18 0458 08/31/18 0600 09/01/18 0928  Weight: 97.5 kg 98.3 kg 99 kg    Examination:  General exam: Appears calm and comfortable  Respiratory system: Diffuse mid to late expiratory wheezing bilaterally, Respiratory effort normal.,  On 7 L HFNC with SPO2 97%. Cardiovascular system: S1 & S2 heard, RRR. No JVD, murmurs, rubs, gallops or clicks. No pedal edema. Gastrointestinal system: Abdomen is nondistended, soft and nontender. No organomegaly or masses felt. Normal bowel sounds heard. Central nervous system: Alert and oriented. No focal neurological deficits. Extremities: Symmetric 5 x 5 power. Skin: No rashes, lesions or ulcers Psychiatry: Judgement and insight appear normal. Mood & affect appropriate.     Data Reviewed: I have personally reviewed following labs and imaging studies  CBC: Recent Labs  Lab 08/26/18 1633 08/26/18 1846 08/27/18 0250 08/29/18 0240  WBC 15.6*  --  18.0* 14.0*  NEUTROABS 13.3*  --   --  11.2*  HGB 10.1* 12.2 9.6* 9.4*  HCT 40.0 36.0 41.4 35.7*  MCV 82.3  --  89.8 80.0  PLT 563*  --  525* 798*   Basic Metabolic Panel: Recent Labs  Lab 08/26/18 2148 08/27/18 0250 08/29/18 0240 08/31/18 0320 09/01/18 0255  NA 141 142 142  141 143  K 5.2* 5.0 4.1 2.9* 3.8  CL 102 100 96* 92* 97*  CO2 28 29 36* 37* 33*  GLUCOSE 464* 367* 210* 125* 108*  BUN 26* 27* 26* 28* 24*  CREATININE 1.22* 1.37* 1.08* 1.04* 0.91  CALCIUM 9.2 8.9 8.7* 8.8* 8.6*  MG  --   --   --  2.2 2.2   GFR: Estimated Creatinine Clearance: 63.6 mL/min (by C-G formula based on SCr of 0.91 mg/dL). Liver Function Tests: No results for input(s): AST, ALT, ALKPHOS, BILITOT, PROT, ALBUMIN in the last 168 hours. No results for input(s): LIPASE, AMYLASE in the last 168 hours. No results for input(s): AMMONIA in the last 168 hours. Coagulation Profile: No results for input(s): INR, PROTIME in the last 168 hours. Cardiac Enzymes: No results for input(s): CKTOTAL, CKMB, CKMBINDEX, TROPONINI in the last 168 hours. BNP (last 3 results) No results for input(s): PROBNP in the last 8760 hours. HbA1C: No results for input(s): HGBA1C in the last 72 hours. CBG: Recent Labs  Lab 08/31/18 1057 08/31/18 1611 08/31/18 2134 09/01/18 0618 09/01/18 1148  GLUCAP 109* 246* 184* 114* 208*   Lipid Profile: No results for input(s): CHOL, HDL, LDLCALC, TRIG, CHOLHDL, LDLDIRECT in the last 72 hours. Thyroid Function Tests: No results for input(s): TSH, T4TOTAL, FREET4, T3FREE, THYROIDAB in the last 72 hours.  Anemia Panel: No results for input(s): VITAMINB12, FOLATE, FERRITIN, TIBC, IRON, RETICCTPCT in the last 72 hours. Sepsis Labs: No results for input(s): PROCALCITON, LATICACIDVEN in the last 168 hours.  Recent Results (from the past 240 hour(s))  SARS Coronavirus 2 [facility] order, Performed in [facility] hospital lab) Nasopharyngeal Nasopharyngeal Swab     Status: None   Collection Time: 08/26/18  4:24 PM   Specimen: Nasopharyngeal Swab  Result Value Ref Range Status   SARS Coronavirus 2 NEGATIVE NEGATIVE Final    Comment: (NOTE) If result is NEGATIVE SARS-CoV-2 target nucleic acids are NOT DETECTED. The SARS-CoV-2 RNA is generally detectable in upper and  lower  respiratory specimens during the acute phase of infection. The lowest  concentration of SARS-CoV-2 viral copies this assay can detect is 250  copies / mL. A negative result does not preclude SARS-CoV-2 infection  and should not be used as the sole basis for treatment or other  patient management decisions.  A negative result may occur with  improper specimen collection / handling, submission of specimen other  than nasopharyngeal swab, presence of viral mutation(s) within the  areas targeted by this assay, and inadequate number of viral copies  (<250 copies / mL). A negative result must be combined with clinical  observations, patient history, and epidemiological information. If result is POSITIVE SARS-CoV-2 target nucleic acids are DETECTED. The SARS-CoV-2 RNA is generally detectable in upper and lower  respiratory specimens dur ing the acute phase of infection.  Positive  results are indicative of active infection with SARS-CoV-2.  Clinical  correlation with patient history and other diagnostic information is  necessary to determine patient infection status.  Positive results do  not rule out bacterial infection or co-infection with other viruses. If result is PRESUMPTIVE POSTIVE SARS-CoV-2 nucleic acids MAY BE PRESENT.   A presumptive positive result was obtained on the submitted specimen  and confirmed on repeat testing.  While 2019 novel coronavirus  (SARS-CoV-2) nucleic acids may be present in the submitted sample  additional confirmatory testing may be necessary for epidemiological  and / or clinical management purposes  to differentiate between  SARS-CoV-2 and other Sarbecovirus currently known to infect humans.  If clinically indicated additional testing with an alternate test  methodology [phone]) is advised. The SARS-CoV-2 RNA is generally  detectable in upper and lower respiratory sp ecimens during the acute  phase of infection. The expected result is  Negative. Fact Sheet for Patients:  StrictlyIdeas.no Fact Sheet for Healthcare Providers: BankingDealers.co.za This test is not yet approved or cleared by the [place] FDA and has been authorized for detection and/or diagnosis of SARS-CoV-2 by FDA under an Emergency Use Authorization (EUA).  This EUA will remain in effect (meaning this test can be used) for the duration of the COVID-19 declaration under Section 564(b)(1) of the Act, 21 U.S.C. section 360bbb-3(b)(1), unless the authorization is terminated or revoked sooner. Performed at [facility], [place] [address], [place], [place] [address]          Radiology Studies: No results found.      Scheduled Meds:  fluticasone furoate-vilanterol  1 puff Inhalation Daily   And   umeclidinium bromide  1 puff Inhalation Daily   furosemide  40 mg Oral BID   heparin  5,000 Units Subcutaneous Q8H   insulin aspart  0-20 Units Subcutaneous TID WC   insulin aspart  0-5 Units Subcutaneous QHS   insulin glargine  40 Units Subcutaneous QHS   ipratropium-albuterol  3 mL Nebulization TID   mouth rinse  15 mL Mouth Rinse BID   nystatin  5 mL Oral Daily   pantoprazole  40 mg Oral Daily   PARoxetine  40 mg Oral Daily   predniSONE  20 mg Oral Q breakfast   sodium chloride flush  3 mL Intravenous Q12H   sodium zirconium cyclosilicate  10 g Oral Once   Continuous Infusions:   LOS: 6 days    Time spent: 31 minutes spent on chart review, personally reviewed all imaging and lab tests, discussion with nursing staff, consultants, updating family and interview/physical exam; more than 50% of that time was spent in counseling and/or coordination of care.    Yemariam Magar J [place] DO [facility] Pager [phone]  If 7PM-7AM, please contact night-coverage [email or web address] Password [email or web address] 09/01/2018, 1:18 PM

## 2018-09-01 NOTE — Progress Notes (Signed)
Pt placed on BIPAP for night time rest.  Tolerating well at this time.  RT will continue to monitor and assess throughout the night.

## 2018-09-01 NOTE — TOC Progression Note (Signed)
Transition of Care [facility]) - Progression Note    Patient Details  Name: [name] MRN: [id number] Date of Birth: Feb 04, 1945  Transition of Care [facility]) CM/SW [place], [place] Phone Number: 09/01/2018, 3:51 PM  Clinical Narrative:     CSW visit with the patient at bedside to give bed offers. Patient was informed of SNFs offers. Patient states she wants to discuss with there family and requested CSW come back later.   Thurmond Butts, MSW, [facility] Clinical Social Worker [phone]   Expected Discharge Plan: Skilled Nursing Facility Barriers to Discharge: SNF Pending bed offer, Continued Medical Work up  Expected Discharge Plan and Services Expected Discharge Plan: [place] In-house Referral: Clinical Social Work     Living arrangements for the past 2 months: Single Family Home                                       Social Determinants of Health (SDOH) Interventions    Readmission Risk Interventions No flowsheet data found.

## 2018-09-02 LAB — BASIC METABOLIC PANEL
Anion gap: 9 (ref 5–15)
BUN: 22 mg/dL (ref 8–23)
CO2: 35 mmol/L — ABNORMAL HIGH (ref 22–32)
Calcium: 8.6 mg/dL — ABNORMAL LOW (ref 8.9–10.3)
Chloride: 99 mmol/L (ref 98–111)
Creatinine, Ser: 0.99 mg/dL (ref 0.44–1.00)
GFR calc Af Amer: >60 mL/min (ref >60)
GFR calc non Af Amer: 57 mL/min — ABNORMAL LOW (ref >60)
Glucose, Bld: 119 mg/dL — ABNORMAL HIGH (ref 70–99)
Potassium: 3.4 mmol/L — ABNORMAL LOW (ref 3.5–5.1)
Sodium: 143 mmol/L (ref 135–145)

## 2018-09-02 LAB — GLUCOSE, CAPILLARY
Glucose-Capillary: 137 mg/dL — ABNORMAL HIGH (ref 70–99)
Glucose-Capillary: 281 mg/dL — ABNORMAL HIGH (ref 70–99)
Glucose-Capillary: 214 mg/dL — ABNORMAL HIGH (ref 70–99)
Glucose-Capillary: 103 mg/dL — ABNORMAL HIGH (ref 70–99)

## 2018-09-02 LAB — MAGNESIUM: Magnesium: 2.3 mg/dL (ref 1.7–2.4)

## 2018-09-02 MED ORDER — SODIUM CHLORIDE 0.9 % IV BOLUS
250.0000 mL | Freq: Once | INTRAVENOUS | Status: AC
  Administered 2018-09-02: 250 mL via INTRAVENOUS

## 2018-09-02 MED ORDER — IPRATROPIUM-ALBUTEROL 0.5-2.5 (3) MG/3ML IN SOLN
3.0000 mL | Freq: Two times a day (BID) | RESPIRATORY_TRACT | Status: DC
  Administered 2018-09-02 – 2018-09-05 (×6): 3 mL via RESPIRATORY_TRACT
  Filled 2018-09-02 (×7): qty 3

## 2018-09-02 MED ORDER — ALBUMIN HUMAN 25 % IV SOLN
12.5000 g | Freq: Once | INTRAVENOUS | Status: AC
  Administered 2018-09-02: 12.5 g via INTRAVENOUS
  Filled 2018-09-02: qty 50

## 2018-09-02 NOTE — Progress Notes (Signed)
Pt. Has agreed to SNF- and chosen [facility] will f/u with bed offer and SNF arrangement- CM spoke with Thedore Mins at [facility] regarding [facility] needs- [facility] will f/u with pt at [facility] for home trilogy needs/arrangements prior to patient leaving facility in transition to home. Pt aware of this and acknowledged plan for Trilogy.

## 2018-09-02 NOTE — Progress Notes (Signed)
Occupational Therapy Treatment Patient Details Name: [name] MRN: [id number] DOB: 1945/05/06 Today's Date: 09/02/2018    History of present illness Patient is a 73 year old female admitted with dyspnea. She is on 5 lpm O2 at home, has COPD, CHF, DM, HTN, HLD, anxiety.   OT comments  Patient supine in bed and agreeable to OT with encouragement. Patient on 9L HFNC upon entry, monitored O2 saturations throughout session.  Noted dropping to 86% with oral care at EOB, increased to 10 L HFNC to sustained saturations, dropping to 88% during transfer to recliner--recovered with PLB and rest break seated, decreased to 9L at completion of session.  Patient min guard for transfers and LB dressing.  Reviewed use of energy conservation techniques and role of rehab at SNF.  Patient appears more open to rehab option, as she is agreeable that she would have a hard time managing her basic needs at home alone with only 8 hrs/week of aide assist.  DC plan remains SNF. Will follow acutely.    Follow Up Recommendations  SNF    Equipment Recommendations  None recommended by OT    Recommendations for Other Services      Precautions / Restrictions Precautions Precautions: Fall Precaution Comments: watch sats Restrictions Weight Bearing Restrictions: No       Mobility Bed Mobility Overal bed mobility: Needs Assistance Bed Mobility: Supine to Sit     Supine to sit: Supervision     General bed mobility comments: Supervision for line management exiting to R of bed with HOB elevated and use of bed rail.  Transfers Overall transfer level: Needs assistance Equipment used: Rolling walker (2 wheeled) Transfers: Sit to/from [facility] Sit to Stand: Min guard Stand pivot transfers: Min guard       General transfer comment: cueing for hand placement and safety, min gu ard    Balance Overall balance assessment: Needs assistance Sitting-balance support: Feet supported Sitting  balance-Leahy Scale: Good Sitting balance - Comments: no assist required at EOB    Standing balance support: Single extremity supported;During functional activity Standing balance-Leahy Scale: Poor Standing balance comment: min guard for balance/safety with 1 UE support                           ADL either performed or assessed with clinical judgement   ADL Overall ADL's : Needs assistance/impaired     Grooming: Oral care;Set up;Wash/dry hands;Wash/dry face;Sitting Grooming Details (indicate cue type and reason): seated EOB to complete grooming, noted O2 saturations dropped see below for details             Lower Body Dressing: Min guard;Sit to/from stand Lower Body Dressing Details (indicate cue type and reason): figure 4 technique to manage socks, min guard sit<>Stand Toilet Transfer: Min Designer, jewellery Details (indicate cue type and reason): simulated to recliner          Functional mobility during ADLs: Min guard;Cueing for safety General ADL Comments: patient requiring encouargement to participate, on 9L HFNC during session with noted drop to 86% during grooming tasks and transfers to recliner--increased to 10L during activities to maintain saturations >90%. Discussed safety and benefits of rehab, patient reporting having 8hrs of aide assist throughout the week--agreeable to the fact that she will need increased assist to manage basic daily needs at home and understands rehab will assist by increasing her strength/tolerance for daily activities     Vision  Perception     Praxis      Cognition Arousal/Alertness: Awake/alert Behavior During Therapy: Flat affect Overall Cognitive Status: Impaired/Different from baseline Area of Impairment: Safety/judgement;Problem solving                         Safety/Judgement: Decreased awareness of deficits;Decreased awareness of safety   Problem Solving: Slow processing General  Comments: pts awareness to deficits and need for assistance improving, appears open to rehab during my session        Exercises     Shoulder Instructions       General Comments reviewing energy conservation techniques, PLB and ADL compensatory techniques     Pertinent Vitals/ Pain       Pain Assessment: Faces Faces Pain Scale: No hurt  Home Living                                          Prior Functioning/Environment              Frequency  Min 2X/week        Progress Toward Goals  OT Goals(current goals can now be found in the care plan section)  Progress towards OT goals: Progressing toward goals  Acute Rehab OT Goals Patient Stated Goal: to return home, improve mobility OT Goal Formulation: With patient  Plan Discharge plan remains appropriate;Frequency remains appropriate    Co-evaluation                 AM-PAC OT "6 Clicks" Daily Activity     Outcome Measure   Help from another person eating meals?: None Help from another person taking care of personal grooming?: A Little Help from another person toileting, which includes using toliet, bedpan, or urinal?: A Little Help from another person bathing (including washing, rinsing, drying)?: A Little Help from another person to put on and taking off regular upper body clothing?: A Little Help from another person to put on and taking off regular lower body clothing?: A Little 6 Click Score: 19    End of Session Equipment Utilized During Treatment: Oxygen  OT Visit Diagnosis: Unsteadiness on feet (R26.81);Muscle weakness (generalized) (M62.81)   Activity Tolerance Patient tolerated treatment well   Patient Left in chair;with call bell/phone within reach   Nurse Communication Mobility status;Other (comment)(O2)        Time: 8280-0349 OT Time Calculation (min): 26 min  Charges: OT General Charges $OT Visit: 1 Visit OT Treatments $Self Care/Home Management : 8-22  mins $Therapeutic Activity: 8-22 mins  Delight Stare, OT Acute Rehabilitation Services Pager [phone] Office [phone]    Delight Stare 09/02/2018, 4:36 PM

## 2018-09-02 NOTE — Progress Notes (Signed)
Physical Therapy Treatment Patient Details Name: [name] MRN: [id number] DOB: 05/09/1945 Today's Date: 09/02/2018    History of Present Illness Patient is a 73 year old female admitted with dyspnea. She is on 5 lpm O2 at home, has COPD, CHF, DM, HTN, HLD, anxiety.    PT Comments    Pt is making slow progress towards goals. On arrival to room she was on 9L O2. With transition to EOB SpO2 dropped to 85% requiring increase to 10L to recover. Upon standing and ambulating SpO2 dropped to 82% requiring increase to 15L SpO2. Pt continues to states she wants to return home. Discussed with pt the benefits of rehab and safety concerns if pt returns home, as she has no family support or supervision. She reports she has an aid come for 4 hrs 2x a week. If pt does return home she will need 24/7 supervision/assist. Patient would benefit from continued skilled PT to maximize functional independence and activity tolerance. Will continue to follow acutely.     Follow Up Recommendations  SNF     Equipment Recommendations  None recommended by PT    Recommendations for Other Services       Precautions / Restrictions Precautions Precautions: Fall Precaution Comments: watch sats Restrictions Weight Bearing Restrictions: No    Mobility  Bed Mobility Overal bed mobility: Needs Assistance Bed Mobility: Supine to Sit     Supine to sit: Supervision     General bed mobility comments: Supervision for line management exiting to R of bed with HOB elevated and use of bed rail.  Transfers Overall transfer level: Needs assistance Equipment used: Rolling walker (2 wheeled) Transfers: Sit to/from [facility] Sit to Stand: Min guard Stand pivot transfers: Min guard       General transfer comment: cues for hand placement and safety with RW. Pt attempting to sit before [facility] or chair was fully behind her.  Ambulation/Gait Ambulation/Gait assistance: Min guard Gait Distance (Feet):  12 Feet Assistive device: Rolling walker (2 wheeled) Gait Pattern/deviations: Step-through pattern;Decreased stride length;Trunk flexed Gait velocity: decreased   General Gait Details: Ambulation distance limited by fatigue. Pt on 10L portable O2. SpO2 dropped to 82% with OOB activity requiring increase to 15L to recover. Cues for RW proximity during gait.    Stairs             Wheelchair Mobility    Modified Rankin (Stroke Patients Only)       Balance Overall balance assessment: Needs assistance Sitting-balance support: Feet supported Sitting balance-Leahy Scale: Good Sitting balance - Comments: pt able to sit EOB and at [facility] without support   Standing balance support: Bilateral upper extremity supported Standing balance-Leahy Scale: Poor Standing balance comment: able to perform peri care in standing with single UE support                            Cognition Arousal/Alertness: Awake/alert Behavior During Therapy: Flat affect Overall Cognitive Status: Impaired/Different from baseline Area of Impairment: Safety/judgement;Problem solving                         Safety/Judgement: Decreased awareness of deficits;Decreased awareness of safety   Problem Solving: Slow processing General Comments: Pt does not seem aware of deficits, as she wishes to return home with only 8 hours of assistance a week.      Exercises      General Comments  Pertinent Vitals/Pain Pain Assessment: Faces Faces Pain Scale: Hurts little more Pain Descriptors / Indicators: Aching;Burning Pain Intervention(s): Monitored during session;Limited activity within patient's tolerance;Repositioned    Home Living                      Prior Function            PT Goals (current goals can now be found in the care plan section) Acute Rehab PT Goals Patient Stated Goal: to return home, improve mobility PT Goal Formulation: With patient Time For Goal  Achievement: 09/04/18 Potential to Achieve Goals: Fair Progress towards PT goals: Progressing toward goals    Frequency    Min 2X/week      PT Plan Current plan remains appropriate    Co-evaluation              AM-PAC PT "6 Clicks" Mobility   Outcome Measure  Help needed turning from your back to your side while in a flat bed without using bedrails?: A Little Help needed moving from lying on your back to sitting on the side of a flat bed without using bedrails?: A Little Help needed moving to and from a bed to a chair (including a wheelchair)?: A Little Help needed standing up from a chair using your arms (e.g., wheelchair or bedside chair)?: A Little Help needed to walk in hospital room?: A Little Help needed climbing 3-5 steps with a railing? : Total 6 Click Score: 16    End of Session Equipment Utilized During Treatment: Gait belt;Oxygen Activity Tolerance: Patient limited by fatigue;Other (comment)(limited cardiopulmonary function) Patient left: in chair;with call bell/phone within reach Nurse Communication: Mobility status;Other (comment)(O2 Sats) PT Visit Diagnosis: Unsteadiness on feet (R26.81);Muscle weakness (generalized) (M62.81);Difficulty in walking, not elsewhere classified (R26.2);History of falling (Z91.81)     Time: 1104-1130 PT Time Calculation (min) (ACUTE ONLY): 26 min  Charges:  $Gait Training: 8-22 mins $Therapeutic Activity: 8-22 mins                     Benjiman Core, [place] Pager [phone] Acute Rehab   Allena Katz 09/02/2018, 12:10 PM

## 2018-09-02 NOTE — Progress Notes (Signed)
PROGRESS NOTE    [name]  ZOX:[id number] DOB: 05-06-1945 DOA: 08/26/2018 PCP: Binnie Rail, MD    Brief Narrative:  73 y.o. 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  COVID-19 negative.  Chest x-ray showed enlarged cardiac shadow and pulmonary edema which is probably chronic.  She was admitted to hospital due to severity of symptoms.   Assessment & Plan:   Principal Problem:   Acute on chronic respiratory failure with hypoxia (HCC) Active Problems:   Hyperlipidemia   Anxiety   COPD (chronic obstructive pulmonary disease) (HCC)   Acute on chronic diastolic CHF (congestive heart failure) (HCC)   Diabetes (HCC)   Depression   Hyperkalemia  Acute on chronic hypercapnic and hypoxic respiratory failure, PCO2 75 on admission End-stage COPD in setting of continued tobacco abuse  --IV steroids were tapered, now--Home prednisone 20 mg p.o. daily --Continue bronchodilators, incentive spirometry, chest physiotherapy --PFTs 8/11: FEV1 0.69, FVC 1.46, FEV1/FVC: 47 --Continue oral furosemide 40 mg p.o. twice daily --Agreeable finally to skilled facility will need trilogy vent set up post SNF discharge --Patient to follow-up with pulmonary 2 weeks with Dr. [name] or [facility].  Hyperkalemia:  Resolved K 5.6 on admission, received dose of Lokelma. --Potassium 3.4 today-rechecking labs a.m. replace if needed then  Insulin-dependent diabetes with hyperglycemia:  Hemoglobin A1c 9.1 on 08/26/2018.  Home   Lantus 22u qHS. --Continue Lantus 40 units [place] qHS for now --Sugars 200 range and expect will be better controlled now on home dose  GERD: continue PPI  Anxiety/depression:  --continue paroxetine and Xanax  Acute on chronic diastolic congestive heart failure Patient presenting with respiratory distress as above.  Chest x-ray notable for cardiomegaly with pulmonary vascular congestion.  Transthoracic echo 08/27/2018 notable for EF 81-19% with diastolic dysfunction and mild LVH.  Initially diuresed with IV furosemide and transition to furosemide 40 mg p.o. twice daily.  --Net positive 46mL past 24h, with net negative fluid balance of 2.0 L this hospitalization --wt 105-->102.5-->97.5-->98.3-->99Kg-->97 kg and continues to lose  Continued tobacco abuse Patient continues to smoke.  Discussed with patient need for complete cessation given her end-stage COPD that requires supplemental oxygen.  Weakness/debility PT recommends SNF.   --Social work for coordination   DVT prophylaxis: Heparin Code Status: Full code Family Communication: None Disposition Plan: Patient will need skilled placement in the next 24 hours   Consultants:   PCCM - signed off 8/7  Procedures:   PFT's 8/11  Antimicrobials:   None   Subjective:  Doing fair no distress no fever no chills no rales no rhonchi  Objective: Vitals:   09/02/18 0816 09/02/18 0817 09/02/18 0818 09/02/18 1414  BP:    (!) 90/47  Pulse:    90  Resp:    19  Temp:    97.9 F (36.6 C)  TempSrc:    Oral  SpO2: 98% 98% 98% 95%  Weight:      Height:        Intake/Output Summary (Last 24 hours) at 09/02/2018 1639 Last data filed at  09/02/2018 1300 Gross per 24 hour  Intake 1360 ml  Output 900 ml  Net 460 ml   Filed Weights   08/31/18 0600 09/01/18 0928 09/02/18 0743  Weight: 98.3 kg 99 kg 97 kg    Examination:  EOMI NCAT no distress Thick neck Mallampati 4 S1-S2 no murmur rub or gallop Chest is clear no wheeze no  rhonchi Abdomen is morbidly obese slightly tender in epigastrium no rebound Lower extremities have mild swelling Neurologically intact smile symmetric power grossly normal to major muscle groups sensory and reflexes deferred Affect pleasant and euthymic   Data Reviewed: I have personally reviewed following labs and imaging studies  CBC: Recent Labs  Lab 08/26/18 1846 08/27/18 0250 08/29/18 0240  WBC  --  18.0* 14.0*  NEUTROABS  --   --  11.2*  HGB 12.2 9.6* 9.4*  HCT 36.0 41.4 35.7*  MCV  --  89.8 80.0  PLT  --  525* 476*   Basic Metabolic Panel: Recent Labs  Lab 08/27/18 0250 08/29/18 0240 08/31/18 0320 09/01/18 0255 09/02/18 0340  NA 142 142 141 143 143  K 5.0 4.1 2.9* 3.8 3.4*  CL 100 96* 92* 97* 99  CO2 29 36* 37* 33* 35*  GLUCOSE 367* 210* 125* 108* 119*  BUN 27* 26* 28* 24* 22  CREATININE 1.37* 1.08* 1.04* 0.91 0.99  CALCIUM 8.9 8.7* 8.8* 8.6* 8.6*  MG  --   --  2.2 2.2 2.3   GFR: Estimated Creatinine Clearance: 57.8 mL/min (by C-G formula based on SCr of 0.99 mg/dL). Liver Function Tests: No results for input(s): AST, ALT, ALKPHOS, BILITOT, PROT, ALBUMIN in the last 168 hours. No results for input(s): LIPASE, AMYLASE in the last 168 hours. No results for input(s): AMMONIA in the last 168 hours. Coagulation Profile: No results for input(s): INR, PROTIME in the last 168 hours. Cardiac Enzymes: No results for input(s): CKTOTAL, CKMB, CKMBINDEX, TROPONINI in the last 168 hours. BNP (last 3 results) No results for input(s): PROBNP in the last 8760 hours. HbA1C: No results for input(s): HGBA1C in the last 72 hours. CBG: Recent Labs  Lab 09/01/18 1649 09/01/18 2130 09/02/18 0607 09/02/18 1107 09/02/18 1618  GLUCAP 193* 171* 137* 281* 214*   Lipid Profile: No results for input(s): CHOL, HDL, LDLCALC, TRIG, CHOLHDL, LDLDIRECT in the last 72 hours. Thyroid Function Tests: No results for input(s): TSH, T4TOTAL, FREET4, T3FREE, THYROIDAB in the last 72  hours. Anemia Panel: No results for input(s): VITAMINB12, FOLATE, FERRITIN, TIBC, IRON, RETICCTPCT in the last 72 hours. Sepsis Labs: No results for input(s): PROCALCITON, LATICACIDVEN in the last 168 hours.  Recent Results (from the past 240 hour(s))  SARS Coronavirus 2 [facility] order, Performed in [facility] hospital lab) Nasopharyngeal Nasopharyngeal Swab     Status: None   Collection Time: 08/26/18  4:24 PM   Specimen: Nasopharyngeal Swab  Result Value Ref Range Status   SARS Coronavirus 2 NEGATIVE NEGATIVE Final    Comment: (NOTE) If result is NEGATIVE SARS-CoV-2 target nucleic acids are NOT DETECTED. The SARS-CoV-2 RNA is generally detectable in upper and lower  respiratory specimens during the acute phase of infection. The lowest  concentration of SARS-CoV-2 viral copies this assay can detect is 250  copies / mL. A negative result does not preclude SARS-CoV-2 infection  and should not be used as the sole basis for treatment or other  patient management decisions.  A negative result may occur with  improper specimen collection / handling, submission of specimen other  than nasopharyngeal swab, presence of viral mutation(s) within the  areas targeted by this assay, and inadequate number of viral copies  (<250 copies / mL). A negative result must be combined with clinical  observations, patient history, and epidemiological information. If result is POSITIVE SARS-CoV-2 target nucleic acids are DETECTED. The SARS-CoV-2 RNA is generally detectable in upper and lower  respiratory specimens dur ing the acute phase of infection.  Positive  results are indicative of active infection with SARS-CoV-2.  Clinical  correlation with patient history and other diagnostic information is  necessary to determine patient infection status.  Positive results do  not rule out bacterial infection or co-infection with other viruses. If result is PRESUMPTIVE POSTIVE SARS-CoV-2 nucleic acids MAY BE  PRESENT.   A presumptive positive result was obtained on the submitted specimen  and confirmed on repeat testing.  While 2019 novel coronavirus  (SARS-CoV-2) nucleic acids may be present in the submitted sample  additional confirmatory testing may be necessary for epidemiological  and / or clinical management purposes  to differentiate between  SARS-CoV-2 and other Sarbecovirus currently known to infect humans.  If clinically indicated additional testing with an alternate test  methodology [phone]) is advised. The SARS-CoV-2 RNA is generally  detectable in upper and lower respiratory sp ecimens during the acute  phase of infection. The expected result is Negative. Fact Sheet for Patients:  StrictlyIdeas.no Fact Sheet for Healthcare Providers: BankingDealers.co.za This test is not yet approved or cleared by the [place] FDA and has been authorized for detection and/or diagnosis of SARS-CoV-2 by FDA under an Emergency Use Authorization (EUA).  This EUA will remain in effect (meaning this test can be used) for the duration of the COVID-19 declaration under Section 564(b)(1) of the Act, 21 U.S.C. section 360bbb-3(b)(1), unless the authorization is terminated or revoked sooner. Performed at [facility], [place] [address], [place], [place] [address]          Radiology Studies: No results found.      Scheduled Meds: . fluticasone furoate-vilanterol  1 puff Inhalation Daily   And  . umeclidinium bromide  1 puff Inhalation Daily  . furosemide  40 mg Oral BID  . heparin  5,000 Units Subcutaneous Q8H  . insulin aspart  0-20 Units Subcutaneous TID WC  . insulin aspart  0-5 Units Subcutaneous QHS  . insulin glargine  40 Units Subcutaneous QHS  . ipratropium-albuterol  3 mL Nebulization BID  . mouth rinse  15 mL Mouth Rinse BID  . nystatin  5 mL Oral Daily  . pantoprazole  40 mg Oral Daily  . PARoxetine  40 mg Oral Daily   . predniSONE  20 mg Oral Q breakfast  . sodium chloride flush  3 mL Intravenous Q12H  . sodium zirconium cyclosilicate  10 g Oral Once   Continuous Infusions:   LOS: 7 days    Time spent: [place], MD [facility] 4:40 PM   If 7PM-7AM, please contact night-coverage [email or web address] Password [facility] 09/02/2018, 4:39 PM

## 2018-09-02 NOTE — Progress Notes (Signed)
Pt requested bipap off for the night, stating that she couldn't breathe with it on. Only wore for about 2 hours. Placed back on 7L HFNC and tolerating well. Will continue to monitor.  Jaymes Graff, RN

## 2018-09-02 NOTE — TOC Progression Note (Signed)
Transition of Care [facility]) - Progression Note    Patient Details  Name: Jackelynn Hosie MRN: [id number] Date of Birth: 1945/11/12  Transition of Care [facility]) CM/SW [place], [place] Phone Number: 09/02/2018, 4:55 PM  Clinical Narrative:     Patient has selected [place] has notified [facility] and is awaiting confirmation of bed offer.  Patient will need Covid test 24-48hrs prior to discharge.     Thurmond Butts, MSW, [facility] Clinical Social Worker [phone]   Expected Discharge Plan: Skilled Nursing Facility Barriers to Discharge: SNF Pending bed offer, Continued Medical Work up  Expected Discharge Plan and Services Expected Discharge Plan: [place] In-house Referral: Clinical Social Work     Living arrangements for the past 2 months: Single Family Home                                       Social Determinants of Health (SDOH) Interventions    Readmission Risk Interventions No flowsheet data found.

## 2018-09-02 NOTE — Telephone Encounter (Signed)
Pt is still admitted at hospital.

## 2018-09-03 LAB — NOVEL CORONAVIRUS, NAA (HOSP ORDER, SEND-OUT TO REF LAB; TAT 18-24 HRS): SARS-CoV-2, NAA: NOT DETECTED

## 2018-09-03 LAB — GLUCOSE, CAPILLARY
Glucose-Capillary: 169 mg/dL — ABNORMAL HIGH (ref 70–99)
Glucose-Capillary: 253 mg/dL — ABNORMAL HIGH (ref 70–99)
Glucose-Capillary: 261 mg/dL — ABNORMAL HIGH (ref 70–99)
Glucose-Capillary: 140 mg/dL — ABNORMAL HIGH (ref 70–99)
Glucose-Capillary: 139 mg/dL — ABNORMAL HIGH (ref 70–99)

## 2018-09-03 MED ORDER — ACETAZOLAMIDE 250 MG PO TABS
250.0000 mg | ORAL_TABLET | Freq: Two times a day (BID) | ORAL | Status: DC
  Administered 2018-09-03 – 2018-09-05 (×5): 250 mg via ORAL
  Filled 2018-09-03 (×6): qty 1

## 2018-09-03 NOTE — Progress Notes (Signed)
PROGRESS NOTE    [name]  NLG:[id number] DOB: [birth year] DOA: 08/26/2018 PCP: Binnie Rail, MD    Brief Narrative:  73 y.o. 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  COVID-19 negative.  Chest x-ray showed enlarged cardiac shadow and pulmonary edema which is probably chronic.  She was admitted to hospital due to severity of symptoms.   Assessment & Plan:   Principal Problem:   Acute on chronic respiratory failure with hypoxia (HCC) Active Problems:   Hyperlipidemia   Anxiety   COPD (chronic obstructive pulmonary disease) (HCC)   Acute on chronic diastolic CHF (congestive heart failure) (HCC)   Diabetes (HCC)   Depression   Hyperkalemia  Acute on chronic hypercapnic and hypoxic respiratory failure, PCO2 75 on admission End-stage COPD in setting of continued tobacco abuse  --IV steroids were tapered, now--Home prednisone 20 mg p.o. daily --Continue bronchodilators, incentive spirometry, chest physiotherapy --PFTs 8/11: FEV1 0.69, FVC 1.46, FEV1/FVC: 47 --Agreeable finally to skilled facility [will need trilogy vent set up post SNF discharge] --Patient to follow-up with pulmonary 2 weeks with Dr. [name] or [facility].  Hyperkalemia:  Resolved Contraction alkalosis probably as a result of diuresis K 5.6 on admission, received dose of Lokelma. ---Start Diamox 250 twice daily continue diuresis  A.m. labs--strict I/O  Insulin-dependent diabetes with hyperglycemia:  Hemoglobin A1c 9.1 on  08/26/2018.  Home  Lantus 22u qHS. 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  Chest x-ray notable for cardiomegaly with pulmonary vascular congestion.  Transthoracic echo 08/27/2018 notable for EF 08-14% with diastolic dysfunction and mild LVH.   Initially diuresed with IV furosemide and transition to furosemide 40 mg p.o. twice daily.  --I/O is only -575 weight has not really dropped over the past 2 days, I do not think she is compliant with fluid restriction I will asked nursing to place a sign on the door --wt 105-->102.5-->97.5-->98.3-->99Kg-->97 kg but stable at 97  Continued tobacco abuse Patient continues to smoke.  Discussed with patient need for complete cessation given her end-stage COPD that requires supplemental oxygen.  Weakness/debility PT recommends SNF.   --Social work for coordination   DVT prophylaxis: Heparin Code Status: Full code Family Communication: None Disposition Plan: Patient will need skilled placement in the next 24 hours   Consultants:   PCCM - signed off 8/7  Procedures:   PFT's 8/11  Antimicrobials:   None Subjective:  Refusing to wear BiPAP at night because she says it is uncomfortable although willing to try nasal pillows-nursing is aware Also large amounts of urine which is now reported because she is urinating in the bed despite bedside commode-we encouraged her today to get up and go to the commode No chest pain, no fever, no cough, mild wheeze, overall looks relatively well  Objective: Vitals:   09/03/18 0631 09/03/18 0750 09/03/18 0856 09/03/18 1228  BP:  (!) 84/69 (!) 103/52 (!) 96/51  Pulse:  76 96 83   Resp: 16 18 (!) 21 18  Temp:    98.6 F (37 C)  TempSrc:    Oral  SpO2:  94% 91% 98%  Weight:      Height:        Intake/Output Summary (Last 24 hours) at 09/03/2018 1539 Last data filed at 09/03/2018 0902 Gross per 24 hour  Intake 240 ml  Output 300 ml  Net -60 ml   Filed Weights   09/01/18 0928 09/02/18 0743 09/03/18 0612  Weight: 99 kg 97 kg 97.6 kg    Examination:  EOMI NCAT no distress Thick neck Mallampati 4, cannot appreciate JVD as she is sitting on commode when I saw her S1-S2 no murmur rub or gallop Chest is clear no adventitious sound Abdomen is morbidly obese nontender no rebound Lower extremities still have mild lower extremity swelling Neurologically intact smile symmetric power grossly normal to major muscle groups sensory and reflexes deferred Affect pleasant and euthymic   Data Reviewed: I have personally reviewed following labs and imaging studies  CBC: Recent Labs  Lab 08/29/18 0240  WBC 14.0*  NEUTROABS 11.2*  HGB 9.4*  HCT 35.7*  MCV 80.0  PLT 875*   Basic Metabolic Panel: Recent Labs  Lab 08/29/18 0240 08/31/18 0320 09/01/18 0255 09/02/18 0340  NA 142 141 143 143  K 4.1 2.9* 3.8 3.4*  CL 96* 92* 97* 99  CO2 36* 37* 33* 35*  GLUCOSE 210* 125* 108* 119*  BUN 26* 28* 24* 22  CREATININE 1.08* 1.04* 0.91 0.99  CALCIUM 8.7* 8.8* 8.6* 8.6*  MG  --  2.2 2.2 2.3   GFR: Estimated Creatinine Clearance: 58 mL/min (by C-G formula based on SCr of 0.99 mg/dL). Liver Function Tests: No results for input(s): AST, ALT, ALKPHOS, BILITOT, PROT, ALBUMIN in the last 168 hours. No results for input(s): LIPASE, AMYLASE in the last 168 hours. No results for input(s): AMMONIA in the last 168 hours. Coagulation Profile: No results for input(s): INR, PROTIME in the last 168 hours. Cardiac Enzymes: No results for input(s): CKTOTAL, CKMB, CKMBINDEX, TROPONINI in the last 168 hours. BNP (last 3 results) No results for input(s): PROBNP in the last 8760  hours. HbA1C: No results for input(s): HGBA1C in the last 72 hours. CBG: Recent Labs  Lab 09/02/18 1107 09/02/18 1618 09/02/18 2120 09/03/18 0607 09/03/18 1226  GLUCAP 281* 214* 103* 169* 253*   Lipid Profile: No results for input(s): CHOL, HDL, LDLCALC, TRIG, CHOLHDL, LDLDIRECT in the last 72 hours. Thyroid Function Tests: No results for input(s): TSH, T4TOTAL, FREET4, T3FREE, THYROIDAB in the last 72 hours. Anemia Panel: No results for input(s): VITAMINB12, FOLATE, FERRITIN, TIBC, IRON, RETICCTPCT in the last 72 hours. Sepsis Labs: No results for input(s): PROCALCITON, LATICACIDVEN in the last 168 hours.  Recent Results (from the past 240 hour(s))  SARS Coronavirus 2 [facility] order, Performed in [facility] hospital lab) Nasopharyngeal Nasopharyngeal Swab     Status: None   Collection Time: 08/26/18  4:24 PM   Specimen: Nasopharyngeal Swab  Result Value Ref Range Status   SARS Coronavirus 2 NEGATIVE NEGATIVE Final    Comment: (NOTE) If result is NEGATIVE SARS-CoV-2 target nucleic acids are NOT DETECTED. The SARS-CoV-2 RNA is generally detectable in upper and lower  respiratory specimens during the acute phase of infection. The lowest  concentration of SARS-CoV-2  viral copies this assay can detect is 250  copies / mL. A negative result does not preclude SARS-CoV-2 infection  and should not be used as the sole basis for treatment or other  patient management decisions.  A negative result may occur with  improper specimen collection / handling, submission of specimen other  than nasopharyngeal swab, presence of viral mutation(s) within the  areas targeted by this assay, and inadequate number of viral copies  (<250 copies / mL). A negative result must be combined with clinical  observations, patient history, and epidemiological information. If result is POSITIVE SARS-CoV-2 target nucleic acids are DETECTED. The SARS-CoV-2 RNA is generally detectable in upper and lower   respiratory specimens dur ing the acute phase of infection.  Positive  results are indicative of active infection with SARS-CoV-2.  Clinical  correlation with patient history and other diagnostic information is  necessary to determine patient infection status.  Positive results do  not rule out bacterial infection or co-infection with other viruses. If result is PRESUMPTIVE POSTIVE SARS-CoV-2 nucleic acids MAY BE PRESENT.   A presumptive positive result was obtained on the submitted specimen  and confirmed on repeat testing.  While 2019 novel coronavirus  (SARS-CoV-2) nucleic acids may be present in the submitted sample  additional confirmatory testing may be necessary for epidemiological  and / or clinical management purposes  to differentiate between  SARS-CoV-2 and other Sarbecovirus currently known to infect humans.  If clinically indicated additional testing with an alternate test  methodology [phone]) is advised. The SARS-CoV-2 RNA is generally  detectable in upper and lower respiratory sp ecimens during the acute  phase of infection. The expected result is Negative. Fact Sheet for Patients:  StrictlyIdeas.no Fact Sheet for Healthcare Providers: BankingDealers.co.za This test is not yet approved or cleared by the [place] FDA and has been authorized for detection and/or diagnosis of SARS-CoV-2 by FDA under an Emergency Use Authorization (EUA).  This EUA will remain in effect (meaning this test can be used) for the duration of the COVID-19 declaration under Section 564(b)(1) of the Act, 21 U.S.C. section 360bbb-3(b)(1), unless the authorization is terminated or revoked sooner. Performed at [facility], [place] [address], [place],  [address]   Novel Coronavirus, NAA (hospital order; send-out to ref lab)     Status: None   Collection Time: 09/02/18  5:25 PM   Specimen: Nasopharyngeal Swab; Respiratory  Result Value  Ref Range Status   SARS-CoV-2, NAA NOT DETECTED NOT DETECTED Final    Comment: (NOTE) This test was developed and its performance characteristics determined by [employer]. This test has not been FDA cleared or approved. This test has been authorized by FDA under an Emergency Use Authorization (EUA). This test is only authorized for the duration of time the declaration that circumstances exist justifying the authorization of the emergency use of in vitro diagnostic tests for detection of SARS-CoV-2 virus and/or diagnosis of COVID-19 infection under section 564(b)(1) of the Act, 21 U.S.C. 462MMN-8(T)(7), unless the authorization is terminated or revoked sooner. When diagnostic testing is negative, the possibility of a false negative result should be considered in the context of a patient's recent exposures and the presence of clinical signs and symptoms consistent with COVID-19. An individual without symptoms of COVID-19 and who is not shedding SARS-CoV-2 virus would expect to have a negative (not detected) result in this assay. Performed  At: [facility] [place], [place] [id number] Rush Farmer MD YB:[id number]  Coronavirus Source NASOPHARYNGEAL  Final    Comment: Performed at [facility], [place] [address] [place], [place] [address]    Radiology Studies: No results found.  Scheduled Meds: . acetaZOLAMIDE  250 mg Oral BID  . fluticasone furoate-vilanterol  1 puff Inhalation Daily   And  . umeclidinium bromide  1 puff Inhalation Daily  . furosemide  40 mg Oral BID  . heparin  5,000 Units Subcutaneous Q8H  . insulin aspart  0-20 Units Subcutaneous TID WC  . insulin aspart  0-5 Units Subcutaneous QHS  . insulin glargine  40 Units Subcutaneous QHS  . ipratropium-albuterol  3 mL Nebulization BID  . mouth rinse  15 mL Mouth Rinse BID  . nystatin  5 mL Oral Daily  . pantoprazole  40 mg Oral Daily  . PARoxetine  40 mg Oral Daily  .  predniSONE  20 mg Oral Q breakfast  . sodium chloride flush  3 mL Intravenous Q12H  . sodium zirconium cyclosilicate  10 g Oral Once   Continuous Infusions:   LOS: 8 days    Time spent: [place], MD [facility] 3:39 PM   If 7PM-7AM, please contact night-coverage [email or web address] Password [facility] 09/03/2018, 3:39 PM

## 2018-09-03 NOTE — Care Management (Signed)
Spoke w patient at bedside with Caren Griffins CSW. Discussed plans for discharge. Patient states that she lives at home with no support from family. Patient understands that local SNFs cannot support her oxygen demand. She understands that she needs more supervision and support than HH can provide. She is willing to go a SNF [place] out of the county, understanding that it may be in [place] or [place]. MD updated. CSW will broaden search for SNF that can support oxygen demand.

## 2018-09-03 NOTE — TOC Progression Note (Signed)
Transition of Care [facility]) - Progression Note    Patient Details  Name: Radhika Dershem MRN: [id number] Date of Birth: 23-Feb-1945  Transition of [facility]) CM/SW [place], [place] Phone Number: 09/03/2018, 12:08 PM  Clinical Narrative:     Patient was informed [facility] unable to meet her oxygen needs. Patient gave CSW permission to send referrals to other SNFs in this area and outside of [facility]. Patient remains willing  to go to rehab, states she has no support at home. CSW sent referrals to [facility], [place], [facility], [facility].   Thurmond Butts, MSW, [facility] Clinical Social Worker [phone]   Expected Discharge Plan: Skilled Nursing Facility Barriers to Discharge: SNF Pending bed offer, Continued Medical Work up  Expected Discharge Plan and Services Expected Discharge Plan: [place] In-house Referral: Clinical Social Work     Living arrangements for the past 2 months: Single Family Home                                       Social Determinants of Health (SDOH) Interventions    Readmission Risk Interventions No flowsheet data found.

## 2018-09-04 LAB — CBC WITH DIFFERENTIAL/PLATELET
Abs Immature Granulocytes: 0.05 10*3/uL (ref 0.00–0.07)
Basophils Absolute: 0 10*3/uL (ref 0.0–0.1)
Basophils Relative: 0 %
Eosinophils Absolute: 0.1 10*3/uL (ref 0.0–0.5)
Eosinophils Relative: 1 %
HCT: 38.2 % (ref 36.0–46.0)
Hemoglobin: 10 g/dL — ABNORMAL LOW (ref 12.0–15.0)
Immature Granulocytes: 1 %
Lymphocytes Relative: 14 %
Lymphs Abs: 1.4 10*3/uL (ref 0.7–4.0)
MCH: 20.4 pg — ABNORMAL LOW (ref 26.0–34.0)
MCHC: 26.2 g/dL — ABNORMAL LOW (ref 30.0–36.0)
MCV: 78.1 fL — ABNORMAL LOW (ref 80.0–100.0)
Monocytes Absolute: 1.4 10*3/uL — ABNORMAL HIGH (ref 0.1–1.0)
Monocytes Relative: 15 %
Neutro Abs: 6.8 10*3/uL (ref 1.7–7.7)
Neutrophils Relative %: 69 %
Platelets: 265 10*3/uL (ref 150–400)
RBC: 4.89 MIL/uL (ref 3.87–5.11)
RDW: 18.4 % — ABNORMAL HIGH (ref 11.5–15.5)
WBC: 9.8 10*3/uL (ref 4.0–10.5)
nRBC: 0 % (ref 0.0–0.2)

## 2018-09-04 LAB — GLUCOSE, CAPILLARY
Glucose-Capillary: 145 mg/dL — ABNORMAL HIGH (ref 70–99)
Glucose-Capillary: 168 mg/dL — ABNORMAL HIGH (ref 70–99)
Glucose-Capillary: 317 mg/dL — ABNORMAL HIGH (ref 70–99)
Glucose-Capillary: 208 mg/dL — ABNORMAL HIGH (ref 70–99)

## 2018-09-04 LAB — RENAL FUNCTION PANEL
Albumin: 3.5 g/dL (ref 3.5–5.0)
Anion gap: 9 (ref 5–15)
BUN: 18 mg/dL (ref 8–23)
CO2: 27 mmol/L (ref 22–32)
Calcium: 8.8 mg/dL — ABNORMAL LOW (ref 8.9–10.3)
Chloride: 103 mmol/L (ref 98–111)
Creatinine, Ser: 0.9 mg/dL (ref 0.44–1.00)
GFR calc Af Amer: >60 mL/min (ref >60)
GFR calc non Af Amer: >60 mL/min (ref >60)
Glucose, Bld: 186 mg/dL — ABNORMAL HIGH (ref 70–99)
Phosphorus: 2.9 mg/dL (ref 2.5–4.6)
Potassium: 3.4 mmol/L — ABNORMAL LOW (ref 3.5–5.1)
Sodium: 139 mmol/L (ref 135–145)

## 2018-09-04 NOTE — Progress Notes (Signed)
PROGRESS NOTE    [name]  SWH:[id number] DOB: 10/11/1945 DOA: 08/26/2018 PCP: Binnie Rail, MD    Brief Narrative:  73 y.o. 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  COVID-19 negative.  Chest x-ray showed enlarged cardiac shadow and pulmonary edema which is probably chronic.  She was admitted to hospital due to severity of symptoms.   Assessment & Plan:   Principal Problem:   Acute on chronic respiratory failure with hypoxia (HCC) Active Problems:   Hyperlipidemia   Anxiety   COPD (chronic obstructive pulmonary disease) (HCC)   Acute on chronic diastolic CHF (congestive heart failure) (HCC)   Diabetes (HCC)   Depression   Hyperkalemia  Acute on chronic hypercapnic and hypoxic respiratory failure, PCO2 75 on admission End-stage COPD in setting of continued tobacco abuse  --IV steroids were tapered, now--Home prednisone 20 mg p.o. daily --Continue bronchodilators, incentive spirometry, chest physiotherapy --PFTs 8/11: FEV1 0.69, FVC 1.46, FEV1/FVC: 47 --Agreeable finally to skilled facility [will need trilogy vent set up post SNF discharge] --Patient to follow-up with pulmonary 2 weeks with Dr. [name] or [facility].  OSA --not complaint with [facility]--seems to be refusing--RN aware documentation needed re: refusals and will attempt to educate patient again  Hyperkalemia:  Resolved--now Hypo Contraction alkalosis probably as a result of diuresis K 5.6 on admission, received dose of Lokelma.  ---Start Diamox 250 twice daily, Labs improved with this -- continue diuresis per belows  Stand weights and strict I/o  Insulin-dependent diabetes with hyperglycemia:  Hemoglobin A1c 9.1 on 08/26/2018.  Home  Lantus 22u qHS. --Continue Lantus 40 units [place] qHS- eating 100% of her meals --Sugars 145-180  GERD: continue PPI  Anxiety/depression:  --continue paroxetine and Xanax  Acute on chronic diastolic congestive heart failure/a/Chr Cor Pulmonale History of hypertension with current hypotension CXR on admit= cardiomegaly with pulmonary vascular congestion.  Transthoracic echo 08/27/2018 notable for EF 66-59% with diastolic dysfunction and mild LVH.   Initially diuresed with IV furosemide and transition to furosemide 40 mg p.o. twice daily.  --I/O is only -305 --wt 105-->102.5-->97.5-->98.3-->99Kg-->97 kg but stable at 96  Continued tobacco abuse Patient continues to smoke.  Discussed with patient need for complete cessation given her end-stage COPD that requires supplemental oxygen.  Weakness/debility PT recommends SNF.   --Social work for coordination   DVT prophylaxis: Heparin Code Status: Full code Family Communication: None Disposition Plan: Patient will need skilled placement in the next 24 hours   Consultants:   PCCM - signed off 8/7  Procedures:   PFT's 8/11  Antimicrobials:   None Subjective:  ? CPAP overnight--she is sleepign today without any cpap on No distress She feels better Is not passing as much urine No cp  Objective: Vitals:   09/04/18 0809 09/04/18 0810 09/04/18 0948 09/04/18 1345  BP:   (!) 94/52 (!) 94/49  Pulse:   80 90  Resp:   14 18  Temp:    98.6 F (37 C)  TempSrc:    Oral  SpO2: 98% 98% 91% 94%  Weight:  Height:        Intake/Output Summary (Last 24 hours) at 09/04/2018 1537 Last data filed at 09/04/2018 1338 Gross per 24 hour  Intake 480 ml  Output 450 ml  Net 30 ml   Filed Weights   09/02/18 0743 09/03/18 0612  09/04/18 0637  Weight: 97 kg 97.6 kg 96.8 kg    Examination:  No changes from prior exam 8/13  EOMI NCAT no distress Thick neck Mallampati 4, cannot appreciate JVD as she is sitting on commode when I saw her S1-S2 no murmur rub or gallop Chest is clear no adventitious sound Abdomen is morbidly obese nontender no rebound Lower extremities still have mild lower extremity swelling Neurologically intact smile symmetric power grossly normal to major muscle groups sensory and reflexes deferred Affect pleasant and euthymic   Data Reviewed: I have personally reviewed following labs and imaging studies  CBC: Recent Labs  Lab 08/29/18 0240 09/04/18 0327  WBC 14.0* 9.8  NEUTROABS 11.2* 6.8  HGB 9.4* 10.0*  HCT 35.7* 38.2  MCV 80.0 78.1*  PLT 430* 295   Basic Metabolic Panel: Recent Labs  Lab 08/29/18 0240 08/31/18 0320 09/01/18 0255 09/02/18 0340 09/04/18 0327  NA 142 141 143 143 139  K 4.1 2.9* 3.8 3.4* 3.4*  CL 96* 92* 97* 99 103  CO2 36* 37* 33* 35* 27  GLUCOSE 210* 125* 108* 119* 186*  BUN 26* 28* 24* 22 18  CREATININE 1.08* 1.04* 0.91 0.99 0.90  CALCIUM 8.7* 8.8* 8.6* 8.6* 8.8*  MG  --  2.2 2.2 2.3  --   PHOS  --   --   --   --  2.9   GFR: Estimated Creatinine Clearance: 63.5 mL/min (by C-G formula based on SCr of 0.9 mg/dL). Liver Function Tests: Recent Labs  Lab 09/04/18 0327  ALBUMIN 3.5   No results for input(s): LIPASE, AMYLASE in the last 168 hours. No results for input(s): AMMONIA in the last 168 hours. Coagulation Profile: No results for input(s): INR, PROTIME in the last 168 hours. Cardiac Enzymes: No results for input(s): CKTOTAL, CKMB, CKMBINDEX, TROPONINI in the last 168 hours. BNP (last 3 results) No results for input(s): PROBNP in the last 8760 hours. HbA1C: No results for input(s): HGBA1C in the last 72 hours. CBG: Recent Labs  Lab 09/03/18 1707 09/03/18 2113 09/03/18 2142 09/04/18 0635 09/04/18 1225  GLUCAP 261* 140* 139* 145*  168*   Lipid Profile: No results for input(s): CHOL, HDL, LDLCALC, TRIG, CHOLHDL, LDLDIRECT in the last 72 hours. Thyroid Function Tests: No results for input(s): TSH, T4TOTAL, FREET4, T3FREE, THYROIDAB in the last 72 hours. Anemia Panel: No results for input(s): VITAMINB12, FOLATE, FERRITIN, TIBC, IRON, RETICCTPCT in the last 72 hours. Sepsis Labs: No results for input(s): PROCALCITON, LATICACIDVEN in the last 168 hours.  Recent Results (from the past 240 hour(s))  SARS Coronavirus 2 [facility] order, Performed in [facility] hospital lab) Nasopharyngeal Nasopharyngeal Swab     Status: None   Collection Time: 08/26/18  4:24 PM   Specimen: Nasopharyngeal Swab  Result Value Ref Range Status   SARS Coronavirus 2 NEGATIVE NEGATIVE Final    Comment: (NOTE) If result is NEGATIVE SARS-CoV-2 target nucleic acids are NOT DETECTED. The SARS-CoV-2 RNA is generally detectable in upper and lower  respiratory specimens during the acute phase of infection. The lowest  concentration of SARS-CoV-2 viral copies this assay can detect is 250  copies / mL. A negative result does not preclude SARS-CoV-2 infection  and should  not be used as the sole basis for treatment or other  patient management decisions.  A negative result may occur with  improper specimen collection / handling, submission of specimen other  than nasopharyngeal swab, presence of viral mutation(s) within the  areas targeted by this assay, and inadequate number of viral copies  (<250 copies / mL). A negative result must be combined with clinical  observations, patient history, and epidemiological information. If result is POSITIVE SARS-CoV-2 target nucleic acids are DETECTED. The SARS-CoV-2 RNA is generally detectable in upper and lower  respiratory specimens dur ing the acute phase of infection.  Positive  results are indicative of active infection with SARS-CoV-2.  Clinical  correlation with patient history and other diagnostic  information is  necessary to determine patient infection status.  Positive results do  not rule out bacterial infection or co-infection with other viruses. If result is PRESUMPTIVE POSTIVE SARS-CoV-2 nucleic acids MAY BE PRESENT.   A presumptive positive result was obtained on the submitted specimen  and confirmed on repeat testing.  While 2019 novel coronavirus  (SARS-CoV-2) nucleic acids may be present in the submitted sample  additional confirmatory testing may be necessary for epidemiological  and / or clinical management purposes  to differentiate between  SARS-CoV-2 and other Sarbecovirus currently known to infect humans.  If clinically indicated additional testing with an alternate test  methodology [phone]) is advised. The SARS-CoV-2 RNA is generally  detectable in upper and lower respiratory sp ecimens during the acute  phase of infection. The expected result is Negative. Fact Sheet for Patients:  StrictlyIdeas.no Fact Sheet for Healthcare Providers: BankingDealers.co.za This test is not yet approved or cleared by the [place] FDA and has been authorized for detection and/or diagnosis of SARS-CoV-2 by FDA under an Emergency Use Authorization (EUA).  This EUA will remain in effect (meaning this test can be used) for the duration of the COVID-19 declaration under Section 564(b)(1) of the Act, 21 U.S.C. section 360bbb-3(b)(1), unless the authorization is terminated or revoked sooner. Performed at [facility], [place] [address] [place], [place] [address]   Novel Coronavirus, NAA (hospital order; send-out to ref lab)     Status: None   Collection Time: 09/02/18  5:25 PM   Specimen: Nasopharyngeal Swab; Respiratory  Result Value Ref Range Status   SARS-CoV-2, NAA NOT DETECTED NOT DETECTED Final    Comment: (NOTE) This test was developed and its performance characteristics determined by [employer]. This test has  not been FDA cleared or approved. This test has been authorized by FDA under an Emergency Use Authorization (EUA). This test is only authorized for the duration of time the declaration that circumstances exist justifying the authorization of the emergency use of in vitro diagnostic tests for detection of SARS-CoV-2 virus and/or diagnosis of COVID-19 infection under section 564(b)(1) of the Act, 21 U.S.C. 754GBE-0(F)(0), unless the authorization is terminated or revoked sooner. When diagnostic testing is negative, the possibility of a false negative result should be considered in the context of a patient's recent exposures and the presence of clinical signs and symptoms consistent with COVID-19. An individual without symptoms of COVID-19 and who is not shedding SARS-CoV-2 virus would expect to have a negative (not detected) result in this assay. Performed  At: [facility] [address] [place], [place] [id number] Rush Farmer MD IT:[id number]    [place]  Final    Comment: Performed at [facility], [place] [address] [place], [place] [address]  Radiology Studies: No results found.  Scheduled Meds: . acetaZOLAMIDE  250 mg Oral BID  . fluticasone furoate-vilanterol  1 puff Inhalation Daily   And  . umeclidinium bromide  1 puff Inhalation Daily  . furosemide  40 mg Oral BID  . heparin  5,000 Units Subcutaneous Q8H  . insulin aspart  0-20 Units Subcutaneous TID WC  . insulin aspart  0-5 Units Subcutaneous QHS  . insulin glargine  40 Units Subcutaneous QHS  . ipratropium-albuterol  3 mL Nebulization BID  . mouth rinse  15 mL Mouth Rinse BID  . nystatin  5 mL Oral Daily  . pantoprazole  40 mg Oral Daily  . PARoxetine  40 mg Oral Daily  . predniSONE  20 mg Oral Q breakfast  . sodium chloride flush  3 mL Intravenous Q12H  . sodium zirconium cyclosilicate  10 g Oral Once   Continuous Infusions:   LOS: 9 days    Time spent: [place], MD [facility] 3:37 PM   If 7PM-7AM, please contact night-coverage [email or web address] Password [facility] 09/04/2018, 3:37 PM

## 2018-09-05 DIAGNOSIS — K219 Gastro-esophageal reflux disease without esophagitis: Secondary | ICD-10-CM | POA: Diagnosis not present

## 2018-09-05 DIAGNOSIS — R278 Other lack of coordination: Secondary | ICD-10-CM | POA: Diagnosis not present

## 2018-09-05 DIAGNOSIS — G4733 Obstructive sleep apnea (adult) (pediatric): Secondary | ICD-10-CM | POA: Diagnosis not present

## 2018-09-05 DIAGNOSIS — F329 Major depressive disorder, single episode, unspecified: Secondary | ICD-10-CM | POA: Diagnosis not present

## 2018-09-05 DIAGNOSIS — F322 Major depressive disorder, single episode, severe without psychotic features: Secondary | ICD-10-CM | POA: Diagnosis not present

## 2018-09-05 DIAGNOSIS — R279 Unspecified lack of coordination: Secondary | ICD-10-CM | POA: Diagnosis not present

## 2018-09-05 DIAGNOSIS — F419 Anxiety disorder, unspecified: Secondary | ICD-10-CM | POA: Diagnosis not present

## 2018-09-05 DIAGNOSIS — I10 Essential (primary) hypertension: Secondary | ICD-10-CM | POA: Diagnosis not present

## 2018-09-05 DIAGNOSIS — E119 Type 2 diabetes mellitus without complications: Secondary | ICD-10-CM | POA: Diagnosis not present

## 2018-09-05 DIAGNOSIS — R2689 Other abnormalities of gait and mobility: Secondary | ICD-10-CM | POA: Diagnosis not present

## 2018-09-05 DIAGNOSIS — J9621 Acute and chronic respiratory failure with hypoxia: Secondary | ICD-10-CM | POA: Diagnosis not present

## 2018-09-05 DIAGNOSIS — Z515 Encounter for palliative care: Secondary | ICD-10-CM | POA: Diagnosis not present

## 2018-09-05 DIAGNOSIS — I5033 Acute on chronic diastolic (congestive) heart failure: Secondary | ICD-10-CM | POA: Diagnosis not present

## 2018-09-05 DIAGNOSIS — M6281 Muscle weakness (generalized): Secondary | ICD-10-CM | POA: Diagnosis not present

## 2018-09-05 DIAGNOSIS — R52 Pain, unspecified: Secondary | ICD-10-CM | POA: Diagnosis not present

## 2018-09-05 DIAGNOSIS — E1151 Type 2 diabetes mellitus with diabetic peripheral angiopathy without gangrene: Secondary | ICD-10-CM | POA: Diagnosis not present

## 2018-09-05 DIAGNOSIS — J449 Chronic obstructive pulmonary disease, unspecified: Secondary | ICD-10-CM | POA: Diagnosis not present

## 2018-09-05 DIAGNOSIS — E785 Hyperlipidemia, unspecified: Secondary | ICD-10-CM | POA: Diagnosis not present

## 2018-09-05 DIAGNOSIS — Z743 Need for continuous supervision: Secondary | ICD-10-CM | POA: Diagnosis not present

## 2018-09-05 DIAGNOSIS — E1165 Type 2 diabetes mellitus with hyperglycemia: Secondary | ICD-10-CM | POA: Diagnosis not present

## 2018-09-05 LAB — GLUCOSE, CAPILLARY
Glucose-Capillary: 142 mg/dL — ABNORMAL HIGH (ref 70–99)
Glucose-Capillary: 210 mg/dL — ABNORMAL HIGH (ref 70–99)
Glucose-Capillary: 286 mg/dL — ABNORMAL HIGH (ref 70–99)

## 2018-09-05 LAB — CBC WITH DIFFERENTIAL/PLATELET
Abs Immature Granulocytes: 0.08 10*3/uL — ABNORMAL HIGH (ref 0.00–0.07)
Basophils Absolute: 0 10*3/uL (ref 0.0–0.1)
Basophils Relative: 0 %
Eosinophils Absolute: 0 10*3/uL (ref 0.0–0.5)
Eosinophils Relative: 0 %
HCT: 39.1 % (ref 36.0–46.0)
Hemoglobin: 10.4 g/dL — ABNORMAL LOW (ref 12.0–15.0)
Immature Granulocytes: 1 %
Lymphocytes Relative: 15 %
Lymphs Abs: 1.6 10*3/uL (ref 0.7–4.0)
MCH: 20.8 pg — ABNORMAL LOW (ref 26.0–34.0)
MCHC: 26.6 g/dL — ABNORMAL LOW (ref 30.0–36.0)
MCV: 78 fL — ABNORMAL LOW (ref 80.0–100.0)
Monocytes Absolute: 1.6 10*3/uL — ABNORMAL HIGH (ref 0.1–1.0)
Monocytes Relative: 14 %
Neutro Abs: 7.6 10*3/uL (ref 1.7–7.7)
Neutrophils Relative %: 70 %
Platelets: 275 10*3/uL (ref 150–400)
RBC: 5.01 MIL/uL (ref 3.87–5.11)
RDW: 18.4 % — ABNORMAL HIGH (ref 11.5–15.5)
WBC: 10.9 10*3/uL — ABNORMAL HIGH (ref 4.0–10.5)
nRBC: 0 % (ref 0.0–0.2)

## 2018-09-05 LAB — COMPREHENSIVE METABOLIC PANEL
ALT: 37 U/L (ref 0–44)
AST: 24 U/L (ref 15–41)
Albumin: 3.6 g/dL (ref 3.5–5.0)
Alkaline Phosphatase: 56 U/L (ref 38–126)
Anion gap: 9 (ref 5–15)
BUN: 25 mg/dL — ABNORMAL HIGH (ref 8–23)
CO2: 28 mmol/L (ref 22–32)
Calcium: 9.1 mg/dL (ref 8.9–10.3)
Chloride: 104 mmol/L (ref 98–111)
Creatinine, Ser: 1.06 mg/dL — ABNORMAL HIGH (ref 0.44–1.00)
GFR calc Af Amer: >60 mL/min (ref >60)
GFR calc non Af Amer: 53 mL/min — ABNORMAL LOW (ref >60)
Glucose, Bld: 146 mg/dL — ABNORMAL HIGH (ref 70–99)
Potassium: 3.6 mmol/L (ref 3.5–5.1)
Sodium: 141 mmol/L (ref 135–145)
Total Bilirubin: 0.4 mg/dL (ref 0.3–1.2)
Total Protein: 6.4 g/dL — ABNORMAL LOW (ref 6.5–8.1)

## 2018-09-05 MED ORDER — ACETAZOLAMIDE 250 MG PO TABS: 250.0000 mg | ORAL_TABLET | Freq: Two times a day (BID) | ORAL | Status: AC

## 2018-09-05 MED ORDER — ALPRAZOLAM 0.5 MG PO TABS: 0.5000 mg | ORAL_TABLET | Freq: Three times a day (TID) | ORAL | 0 refills | Status: DC

## 2018-09-05 NOTE — Progress Notes (Signed)
Discharge instructions given to [name].  Discussed transfer for rehab to [facility].  Verbalized understanding.

## 2018-09-05 NOTE — Progress Notes (Signed)
Report called to [facility].  Spoke with [employer].  Pending transfer via [facility]

## 2018-09-05 NOTE — Plan of Care (Signed)

## 2018-09-05 NOTE — Discharge Summary (Addendum)
Physician Discharge Summary  [name] JEH:[id number] DOB: 1945-12-07 DOA: 08/26/2018  PCP: Binnie Rail, MD  Admit date: 08/26/2018 Discharge date: 09/05/2018  Time spent: 50 minutes  Recommendations for Outpatient Follow-up:  1. Recommend careful coordination with pulmonologist and cardiology-has low flow heart failure and needs careful titration of meds on discharge- 2. This admission resume Lasix 40 once daily, sent home on prednisone 20 long-term med for severe COPD 3. Added Diamox because of metabolic alkalosis from Lasix-May need titration and/or discontinuation of the same 4. Needs Chem-12 in about 1 week, CBC as well 5. Needs chest x-ray two-view in about a month 6. Recommend titration of CPAP as an outpatient-she was not able to tolerate in the hospital and will be discharging to the facility on high flow nasal cannula 6 L at all times and up to 8 L with activity  7. Patient is at very high risk for decompensation going forward and will probably need to have a goals of care discussion with her pulmonologist in the outpatient setting 8. Consider 4 times daily AC at bedtime sugars and may be transition from Lantus monotherapy to 70/30 insulin twice daily depending on trends of her blood sugar-she was well controlled in the hospital stay  Discharge Diagnoses:  Principal Problem:   Acute on chronic respiratory failure with hypoxia ([place]) Active Problems:   Hyperlipidemia   Anxiety   COPD (chronic obstructive pulmonary disease) (HCC)   Acute on chronic diastolic CHF (congestive heart failure) (HCC)   Diabetes ([place])   Depression   Hyperkalemia   Discharge Condition: Guarded  Diet recommendation: Heart healthy fluid restrict 1500 cc low-salt  Filed Weights   09/03/18 0612 09/04/18 0637 09/05/18 0610  Weight: 97.6 kg 96.8 kg 95.1 kg    History of present illness:  73 y.o. blck female  End stg COPD with chronic respiratory failure on 6 to 7 L of oxygen at home,  chronic  prednisone therapy, chronic diastolic congestive heart failure, insulin-dependent diabetes, hypertension, hyperlipidemia, anxiety, ongoing smoker 5-6 cigarettes a day Admitted 08/26/2018 worsening dyspnea-patient was too scared to come to the hospital because of fears of novel coronavirus 19 and stayed at home for 3 days beyond the time that her pulmonologist told her to come here  Patient was found hypercapnic with 7.23, PCO2 72.5. COVID-19 negative. Chest x-ray showed enlarged cardiac shadow and pulmonary edema which is probably chronic. She was admitted to hospital due to severity of symptoms.  Hospital Course:  Acute on chronic hypercapnic and hypoxic respiratory failure, PCO2 75 on admission End-stage COPD in setting of continued tobacco abuse  --IV steroids were tapered, now--Home prednisone 20 mg p.o. daily --Continue bronchodilators, incentive spirometry, chest physiotherapy --PFTs 8/11: FEV1 0.69, FVC 1.46, FEV1/FVC: 47 --Agreeable finally to skilled facility [-has not been able to tolerate BiPAP in the hospital will need to continue high flow nasal cannula and titrate as an outpatient with pulmonology follow-up I will CC them with regards to the same] --Patient to follow-up with pulmonary 2 weeks with Dr. [name] or [facility].  OSA --not complaint with Bipap--seems to be refusing--RN aware documentation needed re: refusals and will attempt to educate patient again  Hyperkalemia: Resolved--now Hypo Contraction alkalosis probably as a result of diuresis K 5.6 on admission, received dose of Lokelma. ---Start Diamox 250 twice daily, Labs improved with this -- continue diuresis per belows  Stand weights and strict I/o  Insulin-dependent diabetes with hyperglycemia:  Hemoglobin A1c 9.1 on 08/26/2018.  Home  Lantus 22u qHS. --  Continue Lantus 40 units  qHS- eating 100% of her meals --Sugars  well controlled throughout hospital course on sliding scale 140s to 200 on  discharge  GERD:continue PPI  Anxiety/depression:  --continue paroxetine and Xanax  Acute on chronic diastolic congestive heart failure/a/Chr Cor Pulmonale History of hypertension with current hypotension Further diuresis and management of her issues complicated by low blood pressure at times and cannot further diurese because she is dry CXR on admit= cardiomegaly with pulmonary vascular congestion.  Transthoracic echo 08/27/2018 notable for EF 16-10% with diastolic dysfunction and mild LVH.   Initially diuresed with IV furosemide and transition to furosemide 40 mg p.o. twice daily.  On discharge she will be going home on Lasix once a day instead of twice daily --I/O is only -305 --wt 105-->102.5-->97.5-->98.3-->99Kg-->97 kg but stable at 96 She is not a candidate for beta blockade or ACE inhibitor because of low blood pressure and AKI on admission respectively  Continued tobacco abuse Patient continues to smoke.  Discussed with patient need for complete cessation given her end-stage COPD that requires supplemental oxygen.  Weakness/debility PT recommends SNF.   --Social work for coordination   Discharge Exam: Vitals:   09/05/18 1249 09/05/18 1254  BP: 113/80 101/62  Pulse: 88 (!) 108  Resp: (!) 22 16  Temp:    SpO2: 94% 92%    General: Awake coherent pleasant no JVD Cardiovascular: S1-S2 no murmur rub or gallop-on telemetry some runs of SVT which are contained and only about 6-7 beats Respiratory: Clinically clear no added sound no rales no rhonchi Abdomen obese nontender nondistended no rebound Neurologically intact able to converse coherent Power 5/5  Discharge Instructions   Discharge Instructions    Diet - low sodium heart healthy   Complete by: As directed    Increase activity slowly   Complete by: As directed      Allergies as of 09/05/2018      Reactions   Ambien [zolpidem Tartrate] Other (See Comments)   Causes nightmares      Medication List     STOP taking these medications   glucose blood test strip Commonly known as: Contour Next Test     TAKE these medications   acetaZOLAMIDE 250 MG tablet Commonly known as: DIAMOX Take 1 tablet (250 mg total) by mouth 2 (two) times daily.   albuterol 108 (90 Base) MCG/ACT inhaler Commonly known as: VENTOLIN HFA Inhale 2 puffs into the lungs every 6 (six) hours as needed for wheezing or shortness of breath. What changed: Another medication with the same name was removed. Continue taking this medication, and follow the directions you see here.   ALPRAZolam 0.5 MG tablet Commonly known as: XANAX Take 1 tablet (0.5 mg total) by mouth 3 (three) times daily.   diphenhydramine-acetaminophen 25-500 MG Tabs tablet Commonly known as: TYLENOL PM Take 2 tablets by mouth at bedtime as needed (pain/sleep).   fluticasone 50 MCG/ACT nasal spray Commonly known as: FLONASE USE 2 SPRAYS IN EACH NOSTRIL DAILY AS NEEDED What changed: See the new instructions.   furosemide 40 MG tablet Commonly known as: LASIX Take 40 mg by mouth daily.   GlucoCom Lancets 33G Misc Use with Test Strips to take blood sugars   Insulin Glargine 100 UNIT/ML Solostar Pen Commonly known as: Lantus SoloStar INJECT 32 UNITS INTO THE SKIN DAILY AT 10 PM. What changed: additional instructions   Insulin Pen Needle 32G X 4 MM Misc Commonly known as: BD Pen Needle Nano U/F USE TO ADMINISTER  LANTUS INSULIN   nystatin 100000 UNIT/ML suspension Commonly known as: MYCOSTATIN Take 5 mLs (500,000 Units total) by mouth 4 (four) times daily. What changed:   when to take this  additional instructions   pantoprazole 40 MG tablet Commonly known as: PROTONIX Take 1 tablet (40 mg total) by mouth daily.   PARoxetine 40 MG tablet Commonly known as: PAXIL TAKE 1 TABLET BY MOUTH EVERY DAY IN THE MORNING. What changed:   how much to take  how to take this  when to take this   predniSONE 20 MG tablet Commonly known as:  DELTASONE Take 1 tablet (20 mg total) by mouth daily with breakfast.   Trelegy Ellipta 100-62.5-25 MCG/INH Aepb Generic drug: Fluticasone-Umeclidin-Vilant Inhale 1 puff into the lungs daily.   triamcinolone cream 0.1 % Commonly known as: KENALOG Apply 1 application topically 2 (two) times daily. What changed:   when to take this  reasons to take this      Allergies  Allergen Reactions  . Ambien [Zolpidem Tartrate] Other (See Comments)    Causes nightmares      The results of significant diagnostics from this hospitalization (including imaging, microbiology, ancillary and laboratory) are listed below for reference.    Significant Diagnostic Studies: Dg Chest Port 1 View  Result Date: 08/28/2018 CLINICAL DATA:  Heart failure EXAM: PORTABLE CHEST 1 VIEW COMPARISON:  Two days ago FINDINGS: Stable cardiomegaly. Aortic tortuosity accentuated by leftward rotation. Small left pleural effusion which appears loculated laterally. There is emphysema with superimposed interstitial coarsening. IMPRESSION: 1. History of heart failure with stable cardiomegaly and interstitial opacity. There is a left pleural effusion which appears loculated laterally. 2. Emphysema. Electronically Signed   By: Monte Fantasia M.D.   On: 08/28/2018 08:40   Dg Chest Port 1 View  Result Date: 08/26/2018 CLINICAL DATA:  Shortness of breath EXAM: PORTABLE CHEST 1 VIEW COMPARISON:  Radiograph 01/26/2018, chest CT 10/29/2016 FINDINGS: Increasing patchy opacity in the bilateral lung bases and retrocardiac space. The vascularity is indistinct and cephalized. The cardiac silhouette appears enlarged when compared to prior studies accounting for portable technique. Suspect bilateral effusions with obscuration of both hemidiaphragms. No visible pneumothorax. No acute osseous or soft tissue abnormality. Cardiac monitoring leads overlie the chest. IMPRESSION: Findings of heart failure/volume overload with cardiomegaly, edema and  likely bilateral effusions. More confluent opacity could reflect alveolar edema versus infection. Electronically Signed   By: Lovena Le M.D.   On: 08/26/2018 17:18    Microbiology: Recent Results (from the past 240 hour(s))  SARS Coronavirus 2 [facility] order, Performed in [facility] hospital lab) Nasopharyngeal Nasopharyngeal Swab     Status: None   Collection Time: 08/26/18  4:24 PM   Specimen: Nasopharyngeal Swab  Result Value Ref Range Status   SARS Coronavirus 2 NEGATIVE NEGATIVE Final    Comment: (NOTE) If result is NEGATIVE SARS-CoV-2 target nucleic acids are NOT DETECTED. The SARS-CoV-2 RNA is generally detectable in upper and lower  respiratory specimens during the acute phase of infection. The lowest  concentration of SARS-CoV-2 viral copies this assay can detect is 250  copies / mL. A negative result does not preclude SARS-CoV-2 infection  and should not be used as the sole basis for treatment or other  patient management decisions.  A negative result may occur with  improper specimen collection / handling, submission of specimen other  than nasopharyngeal swab, presence of viral mutation(s) within the  areas targeted by this assay, and inadequate number of viral copies  (<  250 copies / mL). A negative result must be combined with clinical  observations, patient history, and epidemiological information. If result is POSITIVE SARS-CoV-2 target nucleic acids are DETECTED. The SARS-CoV-2 RNA is generally detectable in upper and lower  respiratory specimens dur ing the acute phase of infection.  Positive  results are indicative of active infection with SARS-CoV-2.  Clinical  correlation with patient history and other diagnostic information is  necessary to determine patient infection status.  Positive results do  not rule out bacterial infection or co-infection with other viruses. If result is PRESUMPTIVE POSTIVE SARS-CoV-2 nucleic acids MAY BE PRESENT.   A presumptive  positive result was obtained on the submitted specimen  and confirmed on repeat testing.  While 2019 novel coronavirus  (SARS-CoV-2) nucleic acids may be present in the submitted sample  additional confirmatory testing may be necessary for epidemiological  and / or clinical management purposes  to differentiate between  SARS-CoV-2 and other Sarbecovirus currently known to infect humans.  If clinically indicated additional testing with an alternate test  methodology [phone]) is advised. The SARS-CoV-2 RNA is generally  detectable in upper and lower respiratory sp ecimens during the acute  phase of infection. The expected result is Negative. Fact Sheet for Patients:  StrictlyIdeas.no Fact Sheet for Healthcare Providers: BankingDealers.co.za This test is not yet approved or cleared by the [place] FDA and has been authorized for detection and/or diagnosis of SARS-CoV-2 by FDA under an Emergency Use Authorization (EUA).  This EUA will remain in effect (meaning this test can be used) for the duration of the COVID-19 declaration under Section 564(b)(1) of the Act, 21 U.S.C. section 360bbb-3(b)(1), unless the authorization is terminated or revoked sooner. Performed at [facility], [place] [address] [place], [place] [address]   Novel Coronavirus, NAA (hospital order; send-out to ref lab)     Status: None   Collection Time: 09/02/18  5:25 PM   Specimen: Nasopharyngeal Swab; Respiratory  Result Value Ref Range Status   SARS-CoV-2, NAA NOT DETECTED NOT DETECTED Final    Comment: (NOTE) This test was developed and its performance characteristics determined by [employer]. This test has not been FDA cleared or approved. This test has been authorized by FDA under an Emergency Use Authorization (EUA). This test is only authorized for the duration of time the declaration that circumstances exist justifying the authorization of the  emergency use of in vitro diagnostic tests for detection of SARS-CoV-2 virus and/or diagnosis of COVID-19 infection under section 564(b)(1) of the Act, 21 U.S.C. 767HAL-9(F)(7), unless the authorization is terminated or revoked sooner. When diagnostic testing is negative, the possibility of a false negative result should be considered in the context of a patient's recent exposures and the presence of clinical signs and symptoms consistent with COVID-19. An individual without symptoms of COVID-19 and who is not shedding SARS-CoV-2 virus would expect to have a negative (not detected) result in this assay. Performed  At: [facility] [address] [place], [place] [id number] Rush Farmer MD HG:[id number]    Scissors  Final    Comment: Performed at [facility], [place] [address] [place], [place] [address]     Labs: Basic Metabolic Panel: Recent Labs  Lab 08/31/18 0320 09/01/18 0255 09/02/18 0340 09/04/18 0327 09/05/18 0233  NA 141 143 143 139 141  K 2.9* 3.8 3.4* 3.4* 3.6  CL 92* 97* 99 103 104  CO2 37* 33* 35* 27 28  GLUCOSE 125* 108* 119*  186* 146*  BUN 28* 24* 22 18 25*  CREATININE 1.04* 0.91 0.99 0.90 1.06*  CALCIUM 8.8* 8.6* 8.6* 8.8* 9.1  MG 2.2 2.2 2.3  --   --   PHOS  --   --   --  2.9  --    Liver Function Tests: Recent Labs  Lab 09/04/18 0327 09/05/18 0233  AST  --  24  ALT  --  37  ALKPHOS  --  56  BILITOT  --  0.4  PROT  --  6.4*  ALBUMIN 3.5 3.6   No results for input(s): LIPASE, AMYLASE in the last 168 hours. No results for input(s): AMMONIA in the last 168 hours. CBC: Recent Labs  Lab 09/04/18 0327 09/05/18 0233  WBC 9.8 10.9*  NEUTROABS 6.8 7.6  HGB 10.0* 10.4*  HCT 38.2 39.1  MCV 78.1* 78.0*  PLT 265 275   Cardiac Enzymes: No results for input(s): CKTOTAL, CKMB, CKMBINDEX, TROPONINI in the last 168 hours. BNP: BNP (last 3 results) Recent Labs    08/26/18 1633  BNP 374.2*    ProBNP (last 3  results) No results for input(s): PROBNP in the last 8760 hours.  CBG: Recent Labs  Lab 09/04/18 1225 09/04/18 1644 09/04/18 2118 09/05/18 0622 09/05/18 1154  GLUCAP 168* 317* 208* 142* 210*       Signed:  Nita Sells MD   [facility] 09/05/2018, 2:30 PM

## 2018-09-05 NOTE — Progress Notes (Signed)
This RN called to the room because pt wanted to remove her BiPaP mask because it was "driving her crazy." Mask removed and machine placed on standby. Pts HFNC placed back on at 5L. Will continue to monitor. Lajoyce Corners, RN

## 2018-09-05 NOTE — Progress Notes (Signed)
Patient placed on BiPaP by respiratory therapist at midnight. Pt resting comfortably. Will continue to monitor. Lajoyce Corners, RN

## 2018-09-05 NOTE — TOC Transition Note (Signed)
Transition of Care [facility]) - CM/SW Discharge Note   Patient Details  Name: Cherina Dhillon MRN: [id number] Date of Birth: 1945/04/11  [facility]) CM/SW Contact:  Vinie Sill, [place] Phone Number: 09/05/2018, 3:19 PM   Clinical Narrative:     Patient will DC to: [facility]  DC Date: 09/05/2018 Family Notified: Patient declined CSW contacting family or friends- she has done so Transport By: Corey Harold  RN, patient, and facility notified of DC. Discharge Summary sent to facility. RN given number for [phone]. Ambulance transport requested for patient.   Clinical Social Worker signing off. Thurmond Butts, MSW, LCSWA Clinical Social Worker [phone]      Barriers to Discharge: SNF Pending bed offer, Continued Medical Work up   Patient Goals and CMS Choice Patient states their goals for this hospitalization and ongoing recovery are:: to go home CMS Medicare.gov Compare Post Acute Care list provided to:: Patient Choice offered to / list presented to : Patient  Discharge Placement                       Discharge Plan and Services In-house Referral: Clinical Social Work                                   Social Determinants of Health ([place]) Interventions     Readmission Risk Interventions No flowsheet data found.

## 2018-09-05 NOTE — Plan of Care (Signed)
Problem: Education: Goal: Knowledge of disease or condition will improve 09/05/2018 2009 by Glenard Haring, RN Outcome: Adequate for Discharge 09/05/2018 1819 by Glenard Haring, RN Outcome: Progressing Goal: Knowledge of the prescribed therapeutic regimen will improve 09/05/2018 2009 by Glenard Haring, RN Outcome: Adequate for Discharge 09/05/2018 1819 by Glenard Haring, RN Outcome: Progressing Goal: Individualized Educational Video(s) 09/05/2018 2009 by Glenard Haring, RN Outcome: Adequate for Discharge 09/05/2018 1819 by Glenard Haring, RN Outcome: Progressing   Problem: Activity: Goal: Ability to tolerate increased activity will improve 09/05/2018 2009 by Glenard Haring, RN Outcome: Adequate for Discharge 09/05/2018 1819 by Glenard Haring, RN Outcome: Progressing Goal: Will verbalize the importance of balancing activity with adequate rest periods 09/05/2018 2009 by Glenard Haring, RN Outcome: Adequate for Discharge 09/05/2018 1819 by Glenard Haring, RN Outcome: Progressing   Problem: Respiratory: Goal: Ability to maintain a clear airway will improve 09/05/2018 2009 by Glenard Haring, RN Outcome: Adequate for Discharge 09/05/2018 1819 by Glenard Haring, RN Outcome: Progressing Goal: Levels of oxygenation will improve 09/05/2018 2009 by Glenard Haring, RN Outcome: Adequate for Discharge 09/05/2018 1819 by Glenard Haring, RN Outcome: Progressing Goal: Ability to maintain adequate ventilation will improve 09/05/2018 2009 by Glenard Haring, RN Outcome: Adequate for Discharge 09/05/2018 1819 by Glenard Haring, RN Outcome: Progressing   Problem: Education: Goal: Knowledge of General Education information will improve Description: Including pain rating scale, medication(s)/side effects and non-pharmacologic comfort measures 09/05/2018 2009 by Glenard Haring, RN Outcome: Adequate for Discharge 09/05/2018 1819 by Glenard Haring,  RN Outcome: Progressing   Problem: Health Behavior/Discharge Planning: Goal: Ability to manage health-related needs will improve 09/05/2018 2009 by Glenard Haring, RN Outcome: Adequate for Discharge 09/05/2018 1819 by Glenard Haring, RN Outcome: Progressing   Problem: Clinical Measurements: Goal: Ability to maintain clinical measurements within normal limits will improve 09/05/2018 2009 by Glenard Haring, RN Outcome: Adequate for Discharge 09/05/2018 1819 by Glenard Haring, RN Outcome: Progressing Goal: Will remain free from infection 09/05/2018 2009 by Glenard Haring, RN Outcome: Adequate for Discharge 09/05/2018 1819 by Glenard Haring, RN Outcome: Progressing Goal: Diagnostic test results will improve 09/05/2018 2009 by Glenard Haring, RN Outcome: Adequate for Discharge 09/05/2018 1819 by Glenard Haring, RN Outcome: Progressing Goal: Respiratory complications will improve 09/05/2018 2009 by Glenard Haring, RN Outcome: Adequate for Discharge 09/05/2018 1819 by Glenard Haring, RN Outcome: Progressing Goal: Cardiovascular complication will be avoided 09/05/2018 2009 by Glenard Haring, RN Outcome: Adequate for Discharge 09/05/2018 1819 by Glenard Haring, RN Outcome: Progressing   Problem: Activity: Goal: Risk for activity intolerance will decrease 09/05/2018 2009 by Glenard Haring, RN Outcome: Adequate for Discharge 09/05/2018 1819 by Glenard Haring, RN Outcome: Progressing   Problem: Nutrition: Goal: Adequate nutrition will be maintained 09/05/2018 2009 by Glenard Haring, RN Outcome: Adequate for Discharge 09/05/2018 1819 by Glenard Haring, RN Outcome: Progressing   Problem: Coping: Goal: Level of anxiety will decrease 09/05/2018 2009 by Glenard Haring, RN Outcome: Adequate for Discharge 09/05/2018 1819 by Glenard Haring, RN Outcome: Progressing   Problem: Elimination: Goal: Will not experience complications related to  bowel motility 09/05/2018 2009 by Glenard Haring, RN Outcome: Adequate for Discharge 09/05/2018 1819 by Glenard Haring, RN Outcome: Progressing Goal: Will not experience complications related to urinary retention 09/05/2018 2009 by Glenard Haring, RN Outcome: Adequate for Discharge 09/05/2018 1819 by Truitt Merle,  Haskell Flirt, RN Outcome: Progressing   Problem: Pain Managment: Goal: General experience of comfort will improve 09/05/2018 2009 by Glenard Haring, RN Outcome: Adequate for Discharge 09/05/2018 1819 by Glenard Haring, RN Outcome: Progressing   Problem: Safety: Goal: Ability to remain free from injury will improve 09/05/2018 2009 by Glenard Haring, RN Outcome: Adequate for Discharge 09/05/2018 1819 by Glenard Haring, RN Outcome: Progressing   Problem: Skin Integrity: Goal: Risk for impaired skin integrity will decrease 09/05/2018 2009 by Glenard Haring, RN Outcome: Adequate for Discharge 09/05/2018 1819 by Glenard Haring, RN Outcome: Progressing

## 2018-09-05 NOTE — Progress Notes (Signed)
[name] transferred to [facility] rehab via [facility].

## 2018-09-07 ENCOUNTER — Other Ambulatory Visit: Payer: Self-pay | Admitting: *Deleted

## 2018-09-07 ENCOUNTER — Telehealth: Payer: Self-pay

## 2018-09-07 DIAGNOSIS — J449 Chronic obstructive pulmonary disease, unspecified: Secondary | ICD-10-CM | POA: Diagnosis not present

## 2018-09-07 DIAGNOSIS — E1165 Type 2 diabetes mellitus with hyperglycemia: Secondary | ICD-10-CM | POA: Diagnosis not present

## 2018-09-07 DIAGNOSIS — I5033 Acute on chronic diastolic (congestive) heart failure: Secondary | ICD-10-CM | POA: Diagnosis not present

## 2018-09-07 DIAGNOSIS — J9621 Acute and chronic respiratory failure with hypoxia: Secondary | ICD-10-CM | POA: Diagnosis not present

## 2018-09-07 NOTE — Telephone Encounter (Signed)
Pt is on TCM list. Admission on 08/26/2018 and dc'ed on 09/05/2018 for acute respiratory failure with hypoxia, SOB, COPD and Heart Failure.   LVM for pt to call back.

## 2018-09-07 NOTE — Patient Outreach (Signed)
[facility]) Care Management  09/07/2018  [name] 06-27-1945 [id number]   RN Health CoachCase Closure  Referral Date: 11/03/2017 Referral Source: MD Referral Reason for Referral: Housing Resources & Disease Management Education Insurance:Medicare   Outreach Attempt:  Patient discharged to [facility] on 09/05/2018 for rehabilitation.  Plan:  RN Health Coach will close case at this time and make patient inactive with [name].  RN Health Coach will send primary care provider Case Management Closure letter.  RN Health Coach will send patient Case Closure Letter.  [place] [phone] [name]

## 2018-09-08 ENCOUNTER — Other Ambulatory Visit: Payer: Self-pay | Admitting: *Deleted

## 2018-09-08 ENCOUNTER — Ambulatory Visit: Payer: Self-pay | Admitting: *Deleted

## 2018-09-08 NOTE — Patient Outreach (Signed)
Member assessed for potential [facility] Care Management needs as a benefit of  [place] Medicare.  Member is currently receiving rehab therapy at [facility].  [facility] that member was active with [facility] prior to admission. She has limited support at home. She is high risk for readmission.   Confirmation received from [place] that Ms. Rappaport is also active with [name] services and they will continue to follow while at SNF.   Will continue to follow for disposition plans, progression and will collaborate with team members while Ms. Steuck is in SNF.   [name], MSN-Ed, RN,BSN [place] Acute Care Coordinator [phone] [facility]) [phone]  (Toll free office)

## 2018-09-08 NOTE — Telephone Encounter (Signed)
Per D/C summary pt was D/C to SNF. Will need to follow-up w/PCP and Pulmonologist once she has been release from SNF.Marland KitchenJohny [name]

## 2018-09-09 ENCOUNTER — Other Ambulatory Visit: Payer: Self-pay | Admitting: *Deleted

## 2018-09-09 DIAGNOSIS — I5033 Acute on chronic diastolic (congestive) heart failure: Secondary | ICD-10-CM | POA: Diagnosis not present

## 2018-09-09 DIAGNOSIS — M6281 Muscle weakness (generalized): Secondary | ICD-10-CM | POA: Diagnosis not present

## 2018-09-09 DIAGNOSIS — J9621 Acute and chronic respiratory failure with hypoxia: Secondary | ICD-10-CM | POA: Diagnosis not present

## 2018-09-09 DIAGNOSIS — J449 Chronic obstructive pulmonary disease, unspecified: Secondary | ICD-10-CM | POA: Diagnosis not present

## 2018-09-09 NOTE — Telephone Encounter (Signed)
ATC pt, there was no answer and I could not leave a message. Will try back. 

## 2018-09-09 NOTE — Patient Outreach (Signed)
Member assessed for potential [facility] Care Management needs as a benefit of [place] Medicare.  Member is currently receiving rehab therapy at [facility].  Member discussed in weekly telephonic IDT meeting with  facility staff, [facility] UM team, and writer.  Facility reports member wants to return home at discharge. States she remains on high flow oxygen and is ambulating 20' ft x 2 with rolling walker. States she lives alone with daily caregiver assistance from [employer]. Also discussed that [place] is following.    Discussed that Probation officer will continue to collaborate with facility, [facility] UM team, and community partners on member while at [facility].    [name], MSN-Ed, RN,BSN [place] Acute Care Coordinator [phone] [facility]) [phone]  (Toll free office)

## 2018-09-10 NOTE — Telephone Encounter (Signed)
LMTCB x1 for pt.   When pt calls back, she needs to be scheduled for a HFU with an APP or MR.

## 2018-09-10 NOTE — Telephone Encounter (Signed)
See "patient outreach notation" from [facility] dated 09/08/18. The patient is currently receiving pulmonary rehabilitation at [facility]. Based on this and the information provided by the patient, nothing further needed at this time.

## 2018-09-10 NOTE — Telephone Encounter (Signed)
Pt did return call, offered to set up HFU, pt said that she is currently in pulm rehab and WCB to sched appt.[name]

## 2018-09-11 DIAGNOSIS — I5033 Acute on chronic diastolic (congestive) heart failure: Secondary | ICD-10-CM | POA: Diagnosis not present

## 2018-09-11 DIAGNOSIS — F322 Major depressive disorder, single episode, severe without psychotic features: Secondary | ICD-10-CM | POA: Diagnosis not present

## 2018-09-11 DIAGNOSIS — J9621 Acute and chronic respiratory failure with hypoxia: Secondary | ICD-10-CM | POA: Diagnosis not present

## 2018-09-11 DIAGNOSIS — E1151 Type 2 diabetes mellitus with diabetic peripheral angiopathy without gangrene: Secondary | ICD-10-CM | POA: Diagnosis not present

## 2018-09-11 DIAGNOSIS — I10 Essential (primary) hypertension: Secondary | ICD-10-CM | POA: Diagnosis not present

## 2018-09-11 DIAGNOSIS — J449 Chronic obstructive pulmonary disease, unspecified: Secondary | ICD-10-CM | POA: Diagnosis not present

## 2018-09-14 ENCOUNTER — Telehealth: Payer: Self-pay | Admitting: *Deleted

## 2018-09-14 NOTE — Telephone Encounter (Signed)
Contacted patient to follow up on recent transfer from the hospital to [facility]. There was no answer and unable to leave a voicemail as her voice box was full.

## 2018-09-15 ENCOUNTER — Other Ambulatory Visit: Payer: Self-pay

## 2018-09-15 ENCOUNTER — Non-Acute Institutional Stay: Payer: Medicare Other | Admitting: *Deleted

## 2018-09-15 DIAGNOSIS — Z515 Encounter for palliative care: Secondary | ICD-10-CM

## 2018-09-16 ENCOUNTER — Other Ambulatory Visit: Payer: Self-pay

## 2018-09-16 ENCOUNTER — Non-Acute Institutional Stay: Payer: Medicare Other | Admitting: *Deleted

## 2018-09-16 DIAGNOSIS — E1165 Type 2 diabetes mellitus with hyperglycemia: Secondary | ICD-10-CM | POA: Diagnosis not present

## 2018-09-16 DIAGNOSIS — Z515 Encounter for palliative care: Secondary | ICD-10-CM | POA: Diagnosis not present

## 2018-09-16 DIAGNOSIS — I5033 Acute on chronic diastolic (congestive) heart failure: Secondary | ICD-10-CM | POA: Diagnosis not present

## 2018-09-16 DIAGNOSIS — J449 Chronic obstructive pulmonary disease, unspecified: Secondary | ICD-10-CM | POA: Diagnosis not present

## 2018-09-16 DIAGNOSIS — J9621 Acute and chronic respiratory failure with hypoxia: Secondary | ICD-10-CM | POA: Diagnosis not present

## 2018-09-16 NOTE — Progress Notes (Signed)
COMMUNITY PALLIATIVE CARE RN NOTE  PATIENT NAME: [name] DOB: Oct 28, 1945 MRN: [id number]  PRIMARY CARE PROVIDER: Binnie Rail, MD  RESPONSIBLE PARTY:  Acct ID - Guarantor Home Phone Work Phone Relationship Acct Type  [id number] [facility] [phone]  Self P/F     [address] [place], [place], [place] [address]   Due to the COVID-19 crisis, this virtual check-in visit was done via telephone from my office and it was initiated and consent by this patient and or family.  PLAN OF CARE and INTERVENTION:  1. ADVANCE CARE PLANNING/GOALS OF CARE: Her goal is to continue to get stronger so she can return home from Rehab. 2. PATIENT/CAREGIVER EDUCATION: Dyspnea management 3. DISEASE STATUS: Virtual check-in visit completed via telephone. Patient was discharged from [facility] hospital to [facility] for rehab on 09/05/18. She is currently receiving PT/OT and states that they are seeing her 6 days/week. She is working on strengthening and being able to perform ADLs independently. She denies pain at this time. She is currently on O2 at 6L/min via [place]. She does say that her sats drop to about 88% with activity, but not any lower. Otherwise, her sats are in the mid 90s. She is receiving Xanax PRN anxiety which remains effective. She is able to ambulate using a walker. She is able to bathe herself, but says she has difficulties reaching her feet d/t dyspnea when bending over. Her intake remains normal. She feels that therapy is helping. She has occasional bladder incontinence and wears Depends. She is continent of bowels. Will continue to monitor.  HISTORY OF PRESENT ILLNESS:  [name] who currently resides at [facility] SNF for rehab. Palliative care team continues to follow patient. Will continue to visit patient monthly and PRN/  CODE STATUS: Full Code  ADVANCED DIRECTIVES: N MOST FORM: yes PPS: 50%   (Duration of visit and documentation 45 minutes)   Daryl Eastern, RN BSN

## 2018-09-16 NOTE — Progress Notes (Signed)
COMMUNITY PALLIATIVE CARE RN NOTE  PATIENT NAME: [name] DOB: 1946/01/01 MRN: [id number]  PRIMARY CARE PROVIDER: Binnie Rail, MD  RESPONSIBLE PARTY:  Acct ID - Guarantor Home Phone Work Phone Relationship Acct Type  [id number] [facility] [phone]  Self P/F     [address] [place], [place], [place] [address]   Due to the COVID-19 crisis, this virtual check-in visit was done via telephone from my office and it was initiated and consent by this patient and or family.  PLAN OF CARE and INTERVENTION:  1. ADVANCE CARE PLANNING/GOALS OF CARE: Goal is to get stronger and return back to her home after Rehab. 2. PATIENT/CAREGIVER EDUCATION: N/A 3. DISEASE STATUS: Virtual check-in visit completed via telephone. Patient had questions regarding discharge planning at the facility. She is currently at [facility] for rehab. She says that she was told that Medicare will cover 20 days for her rehab. She wants to make sure that she does not stay beyond this time because she would not be able to cover those expenses. She was admitted to this facility on 09/05/18 from [facility] hospital. I contacted Lauren at the facility, who is in charge of discharge planning and left my contact information requesting a return call. Awaiting call back. Patient denies any pain. She remains on O2 at 6L/min via [place]. She continues to work with PT/OT 6 days/week and feels that she is slowly improving. She is ambulatory using a walker and doing well with bathing and dressing herself. She does have some intermittent periods of weakness and fatigue and therapy is encouraging her to sit up more during the day vs lying in bed. Her intake remains normal. Will continue to monitor.  HISTORY OF PRESENT ILLNESS:  [name] who currently resides at [facility] SNF for Rehab. Palliative care team continues to follow patient. Will continue to visit patient monthly and PRN.  CODE STATUS: Full Code ADVANCED DIRECTIVES: N MOST  FORM: yes PPS: 50%   (Duration of visit and documentation 20 minutes)   Daryl Eastern, RN BSN

## 2018-09-17 ENCOUNTER — Other Ambulatory Visit: Payer: Self-pay | Admitting: *Deleted

## 2018-09-17 NOTE — Patient Outreach (Signed)
Late entry on 09/16/18.  Member assessed for potential [facility] Care Management needs as a benefit of  [place] Medicare.  Member is currently receiving rehab therapy at [facility] SNF.   Member discussed in weekly telephonic IDT meeting with facility staff, [facility] UM team, and writer.  Facility reports Ms. Strine's disposition remains for home with home health.  Collaboration with [name] care team as well who continues to follow member for outpatient palliative care services. Ms. Burditt wants to discharge from SNF prior to getting into her copay days.   Will plan to outreach Ms. Crutchley for [place] Management services.    [name], MSN-Ed, RN,BSN [place] Acute Care Coordinator [phone] [facility]) [phone]  (Toll free office)

## 2018-09-21 ENCOUNTER — Other Ambulatory Visit: Payer: Self-pay | Admitting: *Deleted

## 2018-09-21 NOTE — Patient Outreach (Signed)
Member assessed for potential [facility] Care Management needs as a benefit of  [place] Medicare.  Member is currently receiving rehab therapy at [facility] SNF.  Telephone call made on member's cell at [phone]. No answer and unable to leave voicemail message due to mailbox being full.  Will attempt to outreach at later time to discuss [place] Management follow up post SNF dc.   [name], MSN-Ed, RN,BSN [employer] Acute Care Coordinator [phone] [facility]) [phone]  (Toll free office)

## 2018-09-22 ENCOUNTER — Other Ambulatory Visit: Payer: Self-pay

## 2018-09-22 ENCOUNTER — Non-Acute Institutional Stay: Payer: Medicare Other | Admitting: *Deleted

## 2018-09-22 DIAGNOSIS — M6281 Muscle weakness (generalized): Secondary | ICD-10-CM | POA: Diagnosis not present

## 2018-09-22 DIAGNOSIS — J9621 Acute and chronic respiratory failure with hypoxia: Secondary | ICD-10-CM | POA: Diagnosis not present

## 2018-09-22 DIAGNOSIS — J449 Chronic obstructive pulmonary disease, unspecified: Secondary | ICD-10-CM | POA: Diagnosis not present

## 2018-09-22 DIAGNOSIS — E785 Hyperlipidemia, unspecified: Secondary | ICD-10-CM | POA: Diagnosis not present

## 2018-09-22 DIAGNOSIS — K219 Gastro-esophageal reflux disease without esophagitis: Secondary | ICD-10-CM | POA: Diagnosis not present

## 2018-09-22 DIAGNOSIS — E119 Type 2 diabetes mellitus without complications: Secondary | ICD-10-CM | POA: Diagnosis not present

## 2018-09-22 DIAGNOSIS — R278 Other lack of coordination: Secondary | ICD-10-CM | POA: Diagnosis not present

## 2018-09-22 DIAGNOSIS — F419 Anxiety disorder, unspecified: Secondary | ICD-10-CM | POA: Diagnosis not present

## 2018-09-22 DIAGNOSIS — G4733 Obstructive sleep apnea (adult) (pediatric): Secondary | ICD-10-CM | POA: Diagnosis not present

## 2018-09-22 DIAGNOSIS — I5033 Acute on chronic diastolic (congestive) heart failure: Secondary | ICD-10-CM | POA: Diagnosis not present

## 2018-09-22 DIAGNOSIS — R2689 Other abnormalities of gait and mobility: Secondary | ICD-10-CM | POA: Diagnosis not present

## 2018-09-22 DIAGNOSIS — F329 Major depressive disorder, single episode, unspecified: Secondary | ICD-10-CM | POA: Diagnosis not present

## 2018-09-22 DIAGNOSIS — Z515 Encounter for palliative care: Secondary | ICD-10-CM

## 2018-09-22 DIAGNOSIS — E1165 Type 2 diabetes mellitus with hyperglycemia: Secondary | ICD-10-CM | POA: Diagnosis not present

## 2018-09-23 ENCOUNTER — Other Ambulatory Visit: Payer: Self-pay | Admitting: *Deleted

## 2018-09-23 DIAGNOSIS — E1165 Type 2 diabetes mellitus with hyperglycemia: Secondary | ICD-10-CM | POA: Diagnosis not present

## 2018-09-23 DIAGNOSIS — J449 Chronic obstructive pulmonary disease, unspecified: Secondary | ICD-10-CM | POA: Diagnosis not present

## 2018-09-23 DIAGNOSIS — I5033 Acute on chronic diastolic (congestive) heart failure: Secondary | ICD-10-CM | POA: Diagnosis not present

## 2018-09-23 DIAGNOSIS — M6281 Muscle weakness (generalized): Secondary | ICD-10-CM | POA: Diagnosis not present

## 2018-09-23 NOTE — Patient Outreach (Signed)
Member assessed for potential [facility] Care Management needs as a benefit of  [place] Medicare.  Member is currently receiving rehab therapy at [facility].  Telephone call made to Ms. Hueber to discuss [place] Management services. Patient identifiers confirmed.   Ms. Lansang confirms she will discharge home from [facility] to home on 09/25/18.   Ms. Sammet states she lives alone. She has aide assistance thru [employer] for 2 days a week for 4 hours. Encouraged Ms. Jefferys to contact her DSS case worker to request increased hours. Ms. Rotz states she has mentioned increased aide hours in the past but will bring it up again.   Ms. Mancil states she usually uses Development worker, community for transportation. However, she states as of late, transportation has been inconsistent. States she could benefit from transportation assistance from [place] Management. She also states she could benefit from meal assistance. Agreeable to [facility] social worker referral.  Ms. Heitz also endorses she will have palliative follow up with [name].   Ms. Cavness is agreeable to [place] Management referral from [facility] and [facility] BSW. She has a medical history of COPD, CHF, HLD, DM. Wears home oxygen and uses [employer].  Notification sent to facility discharge planner to make aware [facility] will follow post dc. Also inquired about name of health agency.  Will plan to make [place] Management referral closer to discharge from [facility] SN[facility].   [name], MSN-Ed, RN,BSN [place] Acute Care Coordinator [phone] [facility]) [phone]  (Toll free office)

## 2018-09-23 NOTE — Progress Notes (Signed)
COMMUNITY PALLIATIVE CARE RN NOTE  PATIENT NAME: [name] DOB: 01-Oct-1945 MRN: [id number]  PRIMARY CARE PROVIDER: Binnie Rail, MD  RESPONSIBLE PARTY:  Acct ID - Guarantor Home Phone Work Phone Relationship Acct Type  [id number] [facility] [phone]  Self P/F     [address] [place], [place], [place] [address]   Due to the COVID-19 crisis, this virtual check-in visit was done via telephone from my office and it was initiated and consent by this patient and or family.  PLAN OF CARE and INTERVENTION:  1. ADVANCE CARE PLANNING/GOALS OF CARE: Goal is for patient to return to her home. She is a Full Code. 2. PATIENT/CAREGIVER EDUCATION: Dyspnea management 3. DISEASE STATUS: Virtual check-in visit completed via telephone. Patient is currently at [facility] SN[facility] for Rehab. She states that the plan is for her to discharge back to her home this week. Tentative date Thursday, 09/24/18. She denies pain. She does feel stronger since she has received therapy. She continues on Oxygen at 6L/min via [place]. She reports that at times during therapy her oxygen levels have dropped into the lower 80s, however levels improve at rest quicker than it did in the past. She remains ambulatory using a walker. She requires minimal assistance with bathing and is able to dress and feed herself independently. She is intermittently incontinent of bladder. Her intake is normal. She looks forward to returning back to her home this week. She states she will let me know if anything changes in regards to her discharge date. She was previously receiving a home health aide when at home. She has contacted this agency so they will be able to resume services when she returns home. Will continue to monitor.                                                                                           HISTORY OF PRESENT ILLNESS:  [name] who is currently at [facility] SNF for rehab. She is scheduled to discharge home this  week. Pallaitive care team continues to follow patient. Will continue with monthly and PRN visits.  CODE STATUS: Full Code ADVANCED DIRECTIVES: N MOST FORM: no PPS: 50%   (Duration of visit and documentation 45 minutes)   Daryl Eastern, RN BSN

## 2018-09-25 ENCOUNTER — Other Ambulatory Visit: Payer: Self-pay

## 2018-09-25 ENCOUNTER — Other Ambulatory Visit: Payer: Medicare Other | Admitting: *Deleted

## 2018-09-25 ENCOUNTER — Other Ambulatory Visit: Payer: Self-pay | Admitting: *Deleted

## 2018-09-25 DIAGNOSIS — Z515 Encounter for palliative care: Secondary | ICD-10-CM

## 2018-09-25 DIAGNOSIS — I10 Essential (primary) hypertension: Secondary | ICD-10-CM

## 2018-09-25 NOTE — Patient Outreach (Signed)
[facility]) Care Management  09/25/2018  [name] 01-Jun-1945 [id number]   High priority referral received today from [place], [name].  "Please assign to [name] for complex case management, [name] BSW Scientist, water quality) for meals and transportation assistance. Active with [name]. Has [name] for aide assistance. Lives alone. High risk for readmission. Limited support. Wears home 02 and has trilogy. Discharged from [facility] SNF today 09/25/18" Unsuccessful outreach to patient today.  Left voicemail message and mailed unsuccessful outreach letter.  Will attempt to reach again within four business days.  [name], [name] [phone]

## 2018-09-25 NOTE — Patient Outreach (Addendum)
Follow up with [facility] SN[facility] dc planner. Confirmed that [name] is discharging home today 09/25/18.   Member will have [place]. She has aide assistance thru [employer]. She is also active with [facility].   Writer will make [facility] RNCM referral for complex case management and referral to [place] for meals and transportation assistance.   [name] has multiple comorbidites to include COPD, HLD, CHF, DM, HTN.  [name], MSN-Ed, RN,BSN [place] Acute Care Coordinator [phone] [facility]) [phone]  (Toll free office)

## 2018-09-25 NOTE — Progress Notes (Signed)
COMMUNITY PALLIATIVE CARE RN NOTE  PATIENT NAME: [name] DOB: September 27, 1945 MRN: [id number]  PRIMARY CARE PROVIDER: Binnie Rail, MD  RESPONSIBLE PARTY:  Acct ID - Guarantor Home Phone Work Phone Relationship Acct Type  [id number] [facility] [phone]  Self P/F     [address] [place], [place], [place] [address]   Due to the COVID-19 crisis, this virtual check-in visit was done via telephone from my office and it was initiated and consent by this patient and or family.  PLAN OF CARE and INTERVENTION 1. ADVANCE CARE PLANNING/GOALS OF CARE: Goal is for patient to keep her strength up and avoid hospitalizations. 2. PATIENT/CAREGIVER EDUCATION: Dyspnea Management, Pain Management 3. DISEASE STATUS: Virtual check-in visit completed via telephone. Patient was discharged from [facility] today and is now back in her home. She is glad to be back home. She does report feeling stronger overall since completing rehab. She continues with dyspnea with exertion, and at times her Oxygen level has dropped into the mid 70s on 6L/min via [place], however she is recovering at rest within 1-3 minutes vs 5-10 minutes. She remains ambulatory using her walker. She requires some assistance with bathing d/t inability to reach her feet without shortness of breath. Her hired caregiver through [place] is supposed to re-start next week. Her intake remains normal. She is taking her medications without difficulty. Denies dysphagia. She denies pain at this time, but reports a new pain in her neck when she is short of breath while lying down. Pain subsides when she sits up and deep breathes. Unsure of cause. Therapy at the SNF wants patient to work on sitting up more during the day. They feel that she lies down too much during the day.  Will continue to monitor.                                                                                        HISTORY OF PRESENT ILLNESS:  [name] who resides at  home alone. Palliative care team continues to follow patient. Will continue to visit patient monthly and PRN.  CODE STATUS: Full Code ADVANCED DIRECTIVES: N MOST FORM: yes PPS: 50%   (Duration of visit and documentation 30 minutes)   Daryl Eastern, RN BSN

## 2018-09-26 DIAGNOSIS — I110 Hypertensive heart disease with heart failure: Secondary | ICD-10-CM | POA: Diagnosis not present

## 2018-09-26 DIAGNOSIS — E7849 Other hyperlipidemia: Secondary | ICD-10-CM | POA: Diagnosis not present

## 2018-09-26 DIAGNOSIS — Z6836 Body mass index (BMI) 36.0-36.9, adult: Secondary | ICD-10-CM | POA: Diagnosis not present

## 2018-09-26 DIAGNOSIS — Z7951 Long term (current) use of inhaled steroids: Secondary | ICD-10-CM | POA: Diagnosis not present

## 2018-09-26 DIAGNOSIS — Z9181 History of falling: Secondary | ICD-10-CM | POA: Diagnosis not present

## 2018-09-26 DIAGNOSIS — K219 Gastro-esophageal reflux disease without esophagitis: Secondary | ICD-10-CM | POA: Diagnosis not present

## 2018-09-26 DIAGNOSIS — F419 Anxiety disorder, unspecified: Secondary | ICD-10-CM | POA: Diagnosis not present

## 2018-09-26 DIAGNOSIS — J9621 Acute and chronic respiratory failure with hypoxia: Secondary | ICD-10-CM | POA: Diagnosis not present

## 2018-09-26 DIAGNOSIS — E1165 Type 2 diabetes mellitus with hyperglycemia: Secondary | ICD-10-CM | POA: Diagnosis not present

## 2018-09-26 DIAGNOSIS — I959 Hypotension, unspecified: Secondary | ICD-10-CM | POA: Diagnosis not present

## 2018-09-26 DIAGNOSIS — G4733 Obstructive sleep apnea (adult) (pediatric): Secondary | ICD-10-CM | POA: Diagnosis not present

## 2018-09-26 DIAGNOSIS — Z794 Long term (current) use of insulin: Secondary | ICD-10-CM | POA: Diagnosis not present

## 2018-09-26 DIAGNOSIS — I5033 Acute on chronic diastolic (congestive) heart failure: Secondary | ICD-10-CM | POA: Diagnosis not present

## 2018-09-26 DIAGNOSIS — Z9981 Dependence on supplemental oxygen: Secondary | ICD-10-CM | POA: Diagnosis not present

## 2018-09-26 DIAGNOSIS — F17210 Nicotine dependence, cigarettes, uncomplicated: Secondary | ICD-10-CM | POA: Diagnosis not present

## 2018-09-26 DIAGNOSIS — Z7952 Long term (current) use of systemic steroids: Secondary | ICD-10-CM | POA: Diagnosis not present

## 2018-09-26 DIAGNOSIS — E876 Hypokalemia: Secondary | ICD-10-CM | POA: Diagnosis not present

## 2018-09-26 DIAGNOSIS — F329 Major depressive disorder, single episode, unspecified: Secondary | ICD-10-CM | POA: Diagnosis not present

## 2018-09-26 DIAGNOSIS — E669 Obesity, unspecified: Secondary | ICD-10-CM | POA: Diagnosis not present

## 2018-09-26 DIAGNOSIS — J449 Chronic obstructive pulmonary disease, unspecified: Secondary | ICD-10-CM | POA: Diagnosis not present

## 2018-09-29 ENCOUNTER — Encounter: Payer: Self-pay | Admitting: *Deleted

## 2018-09-29 ENCOUNTER — Other Ambulatory Visit: Payer: Self-pay | Admitting: *Deleted

## 2018-09-29 ENCOUNTER — Ambulatory Visit: Payer: Self-pay

## 2018-09-29 ENCOUNTER — Other Ambulatory Visit: Payer: Self-pay

## 2018-09-29 DIAGNOSIS — J449 Chronic obstructive pulmonary disease, unspecified: Secondary | ICD-10-CM | POA: Diagnosis not present

## 2018-09-29 DIAGNOSIS — E1165 Type 2 diabetes mellitus with hyperglycemia: Secondary | ICD-10-CM | POA: Diagnosis not present

## 2018-09-29 DIAGNOSIS — I110 Hypertensive heart disease with heart failure: Secondary | ICD-10-CM | POA: Diagnosis not present

## 2018-09-29 DIAGNOSIS — J9621 Acute and chronic respiratory failure with hypoxia: Secondary | ICD-10-CM | POA: Diagnosis not present

## 2018-09-29 DIAGNOSIS — I5033 Acute on chronic diastolic (congestive) heart failure: Secondary | ICD-10-CM | POA: Diagnosis not present

## 2018-09-29 DIAGNOSIS — I959 Hypotension, unspecified: Secondary | ICD-10-CM | POA: Diagnosis not present

## 2018-09-29 NOTE — Patient Outreach (Addendum)
[facility]) Care Management  09/29/2018  [name] 1945-11-08 [id number]   Subjective: Telephone call to patient's home / mobile number times 2, spoke with patient, and HIPAA verified.  Discussed [facility] Care Management Medicare Transition of care follow up, patient voiced understanding, and is in agreement to follow up.  Patient states she is doing okay, just a little irritated waiting for home aide to come, is not irritated with this RNCM, aide was suppose to come over the weekend, and has received update from agency aide is coming today, not sure what time. Patient states she came home from rehab on 09/25/2018, she has received home health visit on 09/26/2018, and visit went well.  States she has also received follow up from [facility], has a follow up with visit with pulmonologist on 09/30/2018, and will call to verify appointment.  States she is planning to follow up with pulmonologist regarding periodic sleep apnea treatment that she received during recent rehab stay.  States she is planning to call primary MD's office today to schedule follow up appointment.  Patient states she is aware of signs/ symptoms to report, how to reach provider if needed after hours, when to go to ED, and / or call 911.  Discussed importance of hospital follow up with primary MD, patient voices understanding, and states she will follow up as appropriate.   Patient states she is able to manage some of self care and has assistance as needed, has requested additional aide services.  States she also has a friend [name], [phone]) that can assist as needed.  Patient voices understanding of medical diagnosis and treatment plan. States she is accessing her Medicare benefits as needed via member services number on back of card and also accesses her pharmacy benefits through [facility] as needed.  Patient states she remembers speaking with referral source and is familiar with [place] Management services.   Patient is agreement for this RNCM to follow her for short transition of care / care coordination program due to patient's recent hospitalization.   Telephone call to patient's home / mobile number, spoke with patient, and HIPAA verified.  Discussed [facility] Care Management UMR Transition of care follow up, patient voiced understanding, and is in agreement to follow up.  Depression Screening completed, results discussed with patient, patient voiced understanding, is in agreement to referral to [place] Management Social Worker for depression screening follow up, and community counseling resources.  Per chart review, patient has history of depression.  Patient declined to answer any additional questions and RNCM unable to assess if patient will give consent for [place] Management Social Worker to follow up with primary MD regarding depression screening results.  Patient has been already been referred to [facility] Management Social Worker for meals and transportation assistance.  Patient states she continues to smoke daily periodically, is aware of need for smoking cessation, and declines referral to [place] Worker for smoking cessation community resources, states she is aware of how to access if needed in the future.   Discussed Advanced Directives, advised of [place] Management Social Worker Advanced Directives document completion benefit, patient voices understanding, and declined to access benefit at this time.     Patient states she does not have any pharmacy needs at this time.   States she is very appreciative of the follow up and is in agreement to receive [place] Management services.  Telephone call to [employer] Social Worker at [facility], left  voicemail, advised will send her additional referral for Depression Screening following up with this patient, patient advised of  [name]'s attempted outreach, given [name]'s mobile number, and request call back with any questions  regarding pending referral.    Objective: Per KPN (Knowledge Performance Now, point of care tool) and chart review, patient hospitalized 08/26/2018 - 09/05/2018 for Acute on chronic respiratory failure with hypoxia, COPD, Acute on chronic diastolic CHF (congestive heart failure).   Patient also has a history of diabetes, hyperlipidemia, tobacco abuse, and OSA.      Assessment: Received Medicare [place] Management Post Acute Coordinator referral on 09/25/2018.  Referral source: [name].   Referral reason: complex case management, active with [name]. Has [name] for aide assistance. Lives alone. High risk for readmission. Limited support. Wears home 02 and has trilogy. Discharged from [place] today 09/25/18.    Transition of care follow up completed,  RNCM will follow up for transition of care / care coordination, will refer patient to [place] Management Social Worker for depression screening follow up, and community counseling resources. Outpatient Encounter Medications as of 09/29/2018  Medication Sig Note  . acetaZOLAMIDE (DIAMOX) 250 MG tablet Take 1 tablet (250 mg total) by mouth 2 (two) times daily.   Marland Kitchen albuterol (PROVENTIL HFA;VENTOLIN HFA) 108 (90 Base) MCG/ACT inhaler Inhale 2 puffs into the lungs every 6 (six) hours as needed for wheezing or shortness of breath. 08/29/2018: LF [facility]  . ALPRAZolam (XANAX) 0.5 MG tablet Take 1 tablet (0.5 mg total) by mouth 3 (three) times daily.   . diphenhydramine-acetaminophen (TYLENOL PM) 25-500 MG TABS tablet Take 2 tablets by mouth at bedtime as needed (pain/sleep).   . fluticasone (FLONASE) 50 MCG/ACT nasal spray USE 2 SPRAYS IN EACH NOSTRIL DAILY AS NEEDED (Patient taking differently: Place 2 sprays into both nostrils daily. ) 08/29/2018: LF [facility]  . Fluticasone-Umeclidin-Vilant (TRELEGY ELLIPTA) 100-62.5-25 MCG/INH AEPB Inhale 1 puff into the lungs daily. 08/29/2018: LF [facility]  . furosemide (LASIX) 40  MG tablet Take 40 mg by mouth daily.    Marland Kitchen GLUCOCOM LANCETS 33G MISC Use with Test Strips to take blood sugars   . Insulin Glargine (LANTUS SOLOSTAR) 100 UNIT/ML Solostar Pen INJECT 32 UNITS INTO THE SKIN DAILY AT 10 PM. (Patient taking differently: INJECT 22 UNITS INTO THE SKIN DAILY AT 10 PM.)   . Insulin Pen Needle (BD PEN NEEDLE NANO U/F) 32G X 4 MM MISC USE TO ADMINISTER LANTUS INSULIN   . nystatin (MYCOSTATIN) 100000 UNIT/ML suspension Take 5 mLs (500,000 Units total) by mouth 4 (four) times daily. (Patient taking differently: Take 5 mLs by mouth See admin instructions. Swish and swallow 5 mls every morning after using Trelegy)   . pantoprazole (PROTONIX) 40 MG tablet Take 1 tablet (40 mg total) by mouth daily. 08/29/2018: #90 [facility]  . PARoxetine (PAXIL) 40 MG tablet TAKE 1 TABLET BY MOUTH EVERY DAY IN THE MORNING. (Patient taking differently: Take 40 mg by mouth every morning. TAKE 1 TABLET BY MOUTH EVERY DAY IN THE MORNING.) 08/29/2018: #90 [facility]  . predniSONE (DELTASONE) 20 MG tablet Take 1 tablet (20 mg total) by mouth daily with breakfast. 08/29/2018: #30 [facility] (1 refill remaining)  . triamcinolone cream (KENALOG) 0.1 % Apply 1 application topically 2 (two) times daily. (Patient not taking: Reported on 09/29/2018) 09/29/2018: States rash is gone, not needed at this time.    No facility-administered encounter medications on file as of  09/29/2018.    Fall Risk  09/29/2018 08/24/2018 08/11/2018 05/07/2018 03/26/2018  Falls in the past year? 0 1 1 1 1   Comment - - last fall October 2019 Last Fall October 2019 -  Number falls in past yr: 0 1 1 1 1   Injury with Fall? 0 0 0 0 0  Risk for fall due to : Other (Comment) Impaired balance/gait;Impaired mobility History of fall(s);Medication side effect;Impaired balance/gait;Impaired mobility;Impaired vision History of fall(s);Impaired vision;Medication side effect;Impaired balance/gait;Impaired mobility History of fall(s);Impaired  balance/gait;Impaired mobility;Impaired vision;Medication side effect  Risk for fall due to: Comment No risk per patient. - - - -  Follow up Falls prevention discussed Falls prevention discussed Falls evaluation completed;Education provided;Falls prevention discussed Falls evaluation completed;Falls prevention discussed;Education provided Education provided;Falls prevention discussed   Depression screen [facility] 2/9 09/29/2018 08/24/2018 03/26/2018 11/27/2017 11/03/2017  Decreased Interest 1 2 1  0 1  Down, Depressed, Hopeless 1 2 1  0 1  PHQ - 2 Score 2 4 2  0 2  Altered sleeping 1 1 3  - 3  Tired, decreased energy 1 2 2  - 3  Change in appetite 1 1 2  - 2  Feeling bad or failure about yourself  0 1 1 - 1  Trouble concentrating 1 0 1 - 1  Moving slowly or fidgety/restless 0 0 2 - 0  Suicidal thoughts 0 1 0 - -  PHQ-9 Score 6 10 13  - 12  Difficult doing work/chores (No Data) Somewhat difficult Somewhat difficult - Somewhat difficult  Some recent data might be hidden     Plan: RNCM will send patient welcome outreach letter, welcome packet, consent, [facility] pamphlet, and magnet. RNCM will send primary MD barrier/ involvement letter and route assessment.  RNCM will refer patient to [place] Management Social Worker for depression screening follow up, and community counseling resources. RNCM will call patient for 2nd telephone outreach attempt within 14 business days, transition of care/ care coordination follow up, and proceed with case closure, within 10 business days if no return call.   [name] CM Care Plan Problem One     Most Recent Value  Care Plan Problem One  Knowledge deficiet related to self care of COPD.  Role Documenting the Problem One  Care Management Telephonic [place] for Problem One  Active  [facility] Long Term Goal   Patient will have no hospitalizations or emergency room visits in the next 30 days.  [name] Long Term Goal Start Date  09/29/18  Interventions for Problem One Long Term Goal   Reviewed and discussed current care plan and goals, encouraged to keep and attend scheduled medical appointments (even Telehealth appointments), encouraged to continue to use home oxygen, reviewed medications and encouraged medication compliance, reviewed signs / symptoms to report to MD.  [facility] CM Short Term Goal #1   Patient will report she has went to follow up appointments as scheduled, in the next 14 days.  [name] CM Short Term Goal #1 Start Date  09/29/18  Interventions for Short Term Goal #1  Discussed importance of provider follow up with patient, importance of keeping schedule appointments, encouraged patient to follow up with providers office to schedule follow up appointments that have not been scheduled, and to confirm appointments that have been scheduled.    [facility] CM Care Plan Problem Two     Most Recent Value  Care Plan Problem Two  Patient is needing additional aide services.  Role Documenting the Problem Two  Care Management Telephonic Coordinator  Care  Plan for Problem Two  Active  [name] CM Short Term Goal #1   Patient will report, aide services have resumed within the next 14 days.  [name] CM Short Term Goal #1 Start Date  09/29/18  Interventions for Short Term Goal #2   Patient encouraged to follow up with the caregiver agency if aide services not resumed by 09/30/2018.      Collin Hendley H. [name], BSN, [place] Management [facility] Telephonic CM Phone: [phone] Fax: [phone]

## 2018-09-29 NOTE — Addendum Note (Signed)
Addended by: [name] E on: 09/29/2018 04:01 PM   Modules accepted: Orders

## 2018-09-29 NOTE — Patient Outreach (Addendum)
[facility]) Care Management  09/29/2018  [name] 1945/10/16 [id number]   High priority referral received today from [place], [name].  "Please assign to [facility] for complex case management, [name] BSW Scientist, water quality) for meals and transportation assistance. Active with [name]. Has [name] for aide assistance. Lives alone. High risk for readmission. Limited support. Wears home 02 and has trilogy. Discharged from [facility] today 09/25/18" Second outreach attempt today.  Patient answered but asked if she could call back "in five minutes". Will attempt to reach her again tomorrow if no return call.   Addendum: Received return call from patient.  Discussed reason for referral.  Patient already receives Meals on Wheels and denied the need for additional meal resources. Patient is certified for transportation services with [employer] but stated that it is getting more difficult for her to go to appointments alone.  Inquired about family members accompanying her as [employer] does allow a personal care attendant to ride.  Patient stated that her family is very "disfunctaional" and not willing to help.   Informed patient that [employer] is the only transportation service in which the individual/volunteer can stay at appointment.  Patient is aware of this service and occasionally uses it.  Unable to offer additional transportation resources.   At the request of RNCM, Sonda Rumble, discussed counseling options due to score on Depression Screening.  Patient requested call from [place] as she does not want to go to an office.  Patient is aware that [facility] counseling services will be short term and that via phone is only option due to no home visits occurring at this time.   BSW closing case.  Order submitted to CSW for follow up on Depression Screening.    [name], [name] [phone]

## 2018-09-30 ENCOUNTER — Ambulatory Visit: Payer: Self-pay

## 2018-09-30 ENCOUNTER — Inpatient Hospital Stay: Payer: Medicare Other | Admitting: Primary Care

## 2018-09-30 ENCOUNTER — Telehealth: Payer: Self-pay | Admitting: Internal Medicine

## 2018-09-30 NOTE — Telephone Encounter (Signed)
Di you call patient

## 2018-09-30 NOTE — Telephone Encounter (Signed)
Patient returning call to the office. Would like a call back tomorrow.

## 2018-10-01 ENCOUNTER — Telehealth: Payer: Self-pay | Admitting: Internal Medicine

## 2018-10-01 DIAGNOSIS — I959 Hypotension, unspecified: Secondary | ICD-10-CM | POA: Diagnosis not present

## 2018-10-01 DIAGNOSIS — E1165 Type 2 diabetes mellitus with hyperglycemia: Secondary | ICD-10-CM | POA: Diagnosis not present

## 2018-10-01 DIAGNOSIS — J449 Chronic obstructive pulmonary disease, unspecified: Secondary | ICD-10-CM | POA: Diagnosis not present

## 2018-10-01 DIAGNOSIS — I110 Hypertensive heart disease with heart failure: Secondary | ICD-10-CM | POA: Diagnosis not present

## 2018-10-01 DIAGNOSIS — I5033 Acute on chronic diastolic (congestive) heart failure: Secondary | ICD-10-CM | POA: Diagnosis not present

## 2018-10-01 DIAGNOSIS — J9621 Acute and chronic respiratory failure with hypoxia: Secondary | ICD-10-CM | POA: Diagnosis not present

## 2018-10-01 NOTE — Telephone Encounter (Signed)
Gave ok for orders.  

## 2018-10-01 NOTE — Telephone Encounter (Signed)
Tammy called to schedule a follow up for the patient.  This appointment has been scheduled.

## 2018-10-01 NOTE — Telephone Encounter (Signed)
Erlene Quan, [facility] is calling in for OT orders   Frequency: 1 week 4-6   [phone]

## 2018-10-01 NOTE — Telephone Encounter (Signed)
Did not call pt

## 2018-10-02 ENCOUNTER — Encounter: Payer: Self-pay | Admitting: *Deleted

## 2018-10-02 ENCOUNTER — Other Ambulatory Visit: Payer: Self-pay | Admitting: *Deleted

## 2018-10-02 ENCOUNTER — Other Ambulatory Visit: Payer: Self-pay

## 2018-10-02 ENCOUNTER — Ambulatory Visit: Payer: Medicare Other | Admitting: *Deleted

## 2018-10-02 ENCOUNTER — Other Ambulatory Visit: Payer: Medicare Other | Admitting: *Deleted

## 2018-10-02 DIAGNOSIS — Z515 Encounter for palliative care: Secondary | ICD-10-CM

## 2018-10-02 NOTE — Patient Outreach (Signed)
[facility]) Care Management  10/02/2018  [name] 1945-05-22 [id number]   CSW made an initial attempt to try and contact patient today to perform the initial phone assessment, as well as assess and assist with social work needs and services, without success.  A HIPAA compliant message was left for patient on voicemail.  CSW is currently awaiting a return call.  CSW will make a second outreach attempt within the next 3-4 business days, if a return call is not received from patient in the meantime.  CSW will also mail an Outreach Letter to patient's home requesting that patient contact CSW if patient is interested in receiving social work services through [place] with Scientist, clinical (histocompatibility and immunogenetics).  Nat Christen, BSW, MSW, LCSW  Licensed Education officer, environmental Health System  Mailing [place] [address] [place], [place] [address] Physical Address-[address], [place],  [address] Toll Free Main # [phone] Fax # [phone] Cell # [phone]  Office # [phone] [email or web address]

## 2018-10-05 ENCOUNTER — Ambulatory Visit: Payer: Self-pay | Admitting: *Deleted

## 2018-10-05 DIAGNOSIS — J9621 Acute and chronic respiratory failure with hypoxia: Secondary | ICD-10-CM | POA: Diagnosis not present

## 2018-10-05 DIAGNOSIS — I5033 Acute on chronic diastolic (congestive) heart failure: Secondary | ICD-10-CM | POA: Diagnosis not present

## 2018-10-05 DIAGNOSIS — I110 Hypertensive heart disease with heart failure: Secondary | ICD-10-CM | POA: Diagnosis not present

## 2018-10-05 DIAGNOSIS — I959 Hypotension, unspecified: Secondary | ICD-10-CM | POA: Diagnosis not present

## 2018-10-05 DIAGNOSIS — J449 Chronic obstructive pulmonary disease, unspecified: Secondary | ICD-10-CM | POA: Diagnosis not present

## 2018-10-05 DIAGNOSIS — E1165 Type 2 diabetes mellitus with hyperglycemia: Secondary | ICD-10-CM | POA: Diagnosis not present

## 2018-10-05 NOTE — Progress Notes (Signed)
PATIENT NAME: [name] DOB: 07/06/1945 MRN: [id number]  PRIMARY CARE PROVIDER: Binnie Rail, MD  RESPONSIBLE PARTY:  Acct ID - Guarantor Home Phone Work Phone Relationship Acct Type  [id number] [facility] [phone]  Self P/F     [address], [place], [place] [address]   Covid-19 Pre-screening Negative  PLAN OF CARE and INTERVENTION 1. ADVANCE CARE PLANNING/GOALS OF CARE: Goal is to remain in her home and avoid hospitalizations. She is a Full Code. 2. PATIENT/CAREGIVER EDUCATION: Dyspnea Management, Medication Review 3. DISEASE STATUS:  Met with patient in her home. She is glad to be home from Rehab at [facility]. She has been set up with home health through [facility]. She is seen by PT/OT/RN. She feels that she has gotten stronger and taking less time to recover from dyspnea after exertion. Her Oxygen level is at 88% at rest on 6L/min via [place]. Her oxygen does drop at times into the 70s with exertion, but will improved after about 5 minutes. She remains ambulatory using her walker. Her hired caregiver through [place] has resumed twice weekly. She does require some assistance with bathing. Assistance mainly needed for her legs/feet because of shortness of breath when bending over. Occasional urinary incontinence. She has a bedside commode if needed. Her intake is normal. She is taking all of her medications without difficulty. Denies dysphagia. No skin issues. Trace edema noted to bilateral feet. She knows to elevate them to help minimize swelling. Will continue to monitor.                                                                                     HISTORY OF PRESENT ILLNESS:  [name] who resides at home alone. Palliative care team continues to follow patient. Will visit patient monthly and PRN.  CODE STATUS: Full Code ADVANCED DIRECTIVES: Y MOST FORM: yes PPS: 50%   PHYSICAL EXAM:   VITALS: Today's Vitals   10/02/18 1417   BP: 131/69  Pulse: 96  Resp: 20  Temp: 97.6 F (36.4 C)  TempSrc: Temporal  SpO2: (!) 88%  PainSc: 0-No pain    LUNGS: decreased breath sounds CARDIAC: Cor RRR EXTREMITIES: Trace edema to bilateral feet SKIN: Exposed skin is dry and intact  NEURO: Alert and oriented x 3, pleasant mood, ambulatory with walker   (Duration of visit and documentation 60 minutes)    Daryl Eastern, RN BSN

## 2018-10-06 ENCOUNTER — Telehealth: Payer: Self-pay | Admitting: Internal Medicine

## 2018-10-06 ENCOUNTER — Other Ambulatory Visit: Payer: Self-pay

## 2018-10-06 DIAGNOSIS — I5033 Acute on chronic diastolic (congestive) heart failure: Secondary | ICD-10-CM | POA: Diagnosis not present

## 2018-10-06 DIAGNOSIS — I959 Hypotension, unspecified: Secondary | ICD-10-CM | POA: Diagnosis not present

## 2018-10-06 DIAGNOSIS — E1165 Type 2 diabetes mellitus with hyperglycemia: Secondary | ICD-10-CM | POA: Diagnosis not present

## 2018-10-06 DIAGNOSIS — J9621 Acute and chronic respiratory failure with hypoxia: Secondary | ICD-10-CM | POA: Diagnosis not present

## 2018-10-06 DIAGNOSIS — J449 Chronic obstructive pulmonary disease, unspecified: Secondary | ICD-10-CM | POA: Diagnosis not present

## 2018-10-06 DIAGNOSIS — I110 Hypertensive heart disease with heart failure: Secondary | ICD-10-CM | POA: Diagnosis not present

## 2018-10-06 NOTE — Telephone Encounter (Signed)
[name] called to report that the Pts Blood glucose this morning was 290 and Pt is not sure how to take her Novalog and on her lantus she was only giving herself 22 units a night but she is suppose to be taking 32 units per night , so hopefully once she makes those changes it will be more under control

## 2018-10-06 NOTE — Patient Outreach (Signed)
[facility]) Care Management  10/06/2018  [name] 02-02-1945 [id number]   Telephone call for weekly call for primary nurse.  No answer.  HIPAA compliant voice message left.    Plan: RN CM will place patient on primary nurse schedule for return call within 4 business days.    [name], RN, MSN [place] Management Care Management Coordinator Direct Line [phone] Cell [phone] Toll Free: [phone]  Fax: [phone]

## 2018-10-06 NOTE — Telephone Encounter (Signed)
How much NovoLog is she taking?  However sugars been for the past several days?

## 2018-10-07 ENCOUNTER — Other Ambulatory Visit: Payer: Medicare Other | Admitting: *Deleted

## 2018-10-07 ENCOUNTER — Other Ambulatory Visit: Payer: Self-pay

## 2018-10-07 DIAGNOSIS — Z515 Encounter for palliative care: Secondary | ICD-10-CM

## 2018-10-07 DIAGNOSIS — I5033 Acute on chronic diastolic (congestive) heart failure: Secondary | ICD-10-CM | POA: Diagnosis not present

## 2018-10-07 DIAGNOSIS — J449 Chronic obstructive pulmonary disease, unspecified: Secondary | ICD-10-CM | POA: Diagnosis not present

## 2018-10-07 DIAGNOSIS — J9621 Acute and chronic respiratory failure with hypoxia: Secondary | ICD-10-CM | POA: Diagnosis not present

## 2018-10-07 DIAGNOSIS — I110 Hypertensive heart disease with heart failure: Secondary | ICD-10-CM | POA: Diagnosis not present

## 2018-10-07 DIAGNOSIS — E1165 Type 2 diabetes mellitus with hyperglycemia: Secondary | ICD-10-CM | POA: Diagnosis not present

## 2018-10-07 DIAGNOSIS — I959 Hypotension, unspecified: Secondary | ICD-10-CM | POA: Diagnosis not present

## 2018-10-07 NOTE — Telephone Encounter (Signed)
LVM to have pt call back to let me know that she is taking 32 units of lantus.

## 2018-10-07 NOTE — Telephone Encounter (Signed)
Ok continue and call with sugars if still elevated. Confirm she is taking the 32 units of lantus so I can update med list

## 2018-10-07 NOTE — Telephone Encounter (Signed)
Nurse states that pt has not been using Novolog because she did not understand the sliding scale. The nurse stated that she went over that with her and the pt said that she understood and will start using the Novolog. Sugars have been in the 200s. Log has not been kept which the nurse also talked with pt about.

## 2018-10-07 NOTE — Telephone Encounter (Signed)
Patient confirmed she's taking 32 units of lantus stating while she was in the hospital her units were changed. Patient would like a follow up call to discuss [phone]

## 2018-10-08 ENCOUNTER — Other Ambulatory Visit: Payer: Self-pay | Admitting: *Deleted

## 2018-10-08 MED ORDER — LANTUS SOLOSTAR 100 UNIT/ML ~~LOC~~ SOPN: PEN_INJECTOR | SUBCUTANEOUS | 1 refills | Status: DC

## 2018-10-08 NOTE — Telephone Encounter (Signed)
I want her to continue the 32 units of lantus and use the novolog and call in about one week (sooner if needed ) with her sugars

## 2018-10-08 NOTE — Patient Outreach (Signed)
[facility]) Care Management  10/08/2018  [name] [birth year] [id number]   CSW made a second attempt to try and contact patient today to perform phone assessment, as well as assess and assist with social work needs and services, without success.  A HIPAA compliant message was left for patient on voicemail.  CSW continues to await a return call.  CSW will make a third and final outreach attempt within the next 3-4 business days, if a return call is not received from patient in the meantime.  CSW will then proceed with case closure if a return call is not received from patient with a total of 10 business days, as required number of phone attempts will have been made and outreach letter mailed.   Nat Christen, BSW, MSW, LCSW  Licensed Education officer, environmental Health System  Mailing [place] [address], [place], [place] [address] Physical Address-[address], [place], [place] [address] Toll Free Main # [phone] Fax # [phone] Cell # [phone]  Office # [phone] [name]

## 2018-10-08 NOTE — Telephone Encounter (Signed)
Spoke with patient and info given 

## 2018-10-08 NOTE — Progress Notes (Signed)
COMMUNITY PALLIATIVE CARE RN NOTE  PATIENT NAME: [name] DOB: Nov 18, 1945 MRN: [id number]  PRIMARY CARE PROVIDER: Binnie Rail, MD  RESPONSIBLE PARTY:  Acct ID - Guarantor Home Phone Work Phone Relationship Acct Type  [id number] [facility] [phone]  Self P/F     [address] [place], [place], [place] [address]   Due to the COVID-19 crisis, this virtual check-in visit was done via telephone from my office and it was initiated and consent by this patient and or family.  PLAN OF CARE and INTERVENTION 1. ADVANCE CARE PLANNING/GOALS OF CARE: Goal is for patient to continue to get stronger. She is a Full Code. 2. PATIENT/CAREGIVER EDUCATION: Dyspnea Management, Safe Mobility 3. DISEASE STATUS:  Virtual check-in visit completed via telephone. Patient denies pain. She reports that she just finished receiving in home PT today and had a session with OT yesterday. She states that PT told her today was the best session he has had with her. She reports becoming stronger every day. She remains ambulatory using her walker. She continues on Oxygen at 6L/min via [place]. She says she has not required the use of her rescue inhaler or Xanax as often as she has needed to in the past. PT also worked with patient on breathing exercises and wants her to utilize her Incentive spirometer and Acapella device at least twice daily to improve lung function. She continues with hired aids twice weekly. Her intake remains good. She has no complaints at this time. Will continue to monitor.   HISTORY OF PRESENT ILLNESS:  [name] who resides at home alone. Palliative care team continues to follow patient. Will continue to visit patient monthly and PRN.  CODE STATUS: Full Code ADVANCED DIRECTIVES: N MOST FORM: yes PPS: 50%   (Duration of visit and documentation 30 minutes)   Daryl Eastern, RN BSN

## 2018-10-09 ENCOUNTER — Other Ambulatory Visit: Payer: Self-pay

## 2018-10-09 DIAGNOSIS — I5033 Acute on chronic diastolic (congestive) heart failure: Secondary | ICD-10-CM | POA: Diagnosis not present

## 2018-10-09 DIAGNOSIS — J9621 Acute and chronic respiratory failure with hypoxia: Secondary | ICD-10-CM | POA: Diagnosis not present

## 2018-10-09 DIAGNOSIS — E1165 Type 2 diabetes mellitus with hyperglycemia: Secondary | ICD-10-CM | POA: Diagnosis not present

## 2018-10-09 DIAGNOSIS — I959 Hypotension, unspecified: Secondary | ICD-10-CM | POA: Diagnosis not present

## 2018-10-09 DIAGNOSIS — J449 Chronic obstructive pulmonary disease, unspecified: Secondary | ICD-10-CM | POA: Diagnosis not present

## 2018-10-09 DIAGNOSIS — I110 Hypertensive heart disease with heart failure: Secondary | ICD-10-CM | POA: Diagnosis not present

## 2018-10-09 NOTE — Patient Outreach (Signed)
[facility]) Care Management  10/09/2018  [name] 09-12-1945 [id number]   Telephone call for weekly call for primary nurse.  No answer.  HIPAA compliant voice message left.    Plan: RN CM will place patient on primary nurse schedule for return call within 4 business days and send letter.  [name], RN, MSN [place] Management Care Management Coordinator Direct Line [phone] Cell [phone] Toll Free: [phone]  Fax: [phone]

## 2018-10-12 ENCOUNTER — Ambulatory Visit: Payer: Medicare Other | Admitting: *Deleted

## 2018-10-13 ENCOUNTER — Telehealth: Payer: Self-pay | Admitting: Internal Medicine

## 2018-10-13 ENCOUNTER — Other Ambulatory Visit: Payer: Self-pay | Admitting: *Deleted

## 2018-10-13 ENCOUNTER — Other Ambulatory Visit: Payer: Self-pay | Admitting: Internal Medicine

## 2018-10-13 DIAGNOSIS — E1165 Type 2 diabetes mellitus with hyperglycemia: Secondary | ICD-10-CM | POA: Diagnosis not present

## 2018-10-13 DIAGNOSIS — I110 Hypertensive heart disease with heart failure: Secondary | ICD-10-CM | POA: Diagnosis not present

## 2018-10-13 DIAGNOSIS — J9621 Acute and chronic respiratory failure with hypoxia: Secondary | ICD-10-CM | POA: Diagnosis not present

## 2018-10-13 DIAGNOSIS — J449 Chronic obstructive pulmonary disease, unspecified: Secondary | ICD-10-CM | POA: Diagnosis not present

## 2018-10-13 DIAGNOSIS — I959 Hypotension, unspecified: Secondary | ICD-10-CM | POA: Diagnosis not present

## 2018-10-13 DIAGNOSIS — I5033 Acute on chronic diastolic (congestive) heart failure: Secondary | ICD-10-CM | POA: Diagnosis not present

## 2018-10-13 NOTE — Telephone Encounter (Signed)
Levada Dy, RN with advanced hc, calling to report patients blood sugar is 204 fasting. Levada Dy would like a call back verifying the dosage of lantus.

## 2018-10-13 NOTE — Telephone Encounter (Signed)
Spoke with pt to let her know to do lantus 32 units daily and continue with hemolog. Pt expressed understanding. I told her to ask the nurses that comes to help her if she has any questions. Pt stated she would.

## 2018-10-13 NOTE — Patient Outreach (Signed)
[facility]) Care Management  10/13/2018  Dasiah Nees August 17, 1945 [id number]    CSW made a third and final attempt to try and contact patient today to perform phone assessment, as well as assess and assist with social work needs and services, without success.  A HIPAA compliant message was left for patient on voicemail.  CSW is currently awaiting a return call.  CSW will proceed with case closure in two business days, if a return call is not received in the meantime, as required number of phone attempts have been made and an outreach letter was mailed to patient's home allowing 10 business days for a response.  Nat Christen, BSW, MSW, LCSW  Licensed Education officer, environmental Health System  [place] [address] [place], [place] [address] Physical Address-[address], [place], [place] [address] Toll Free Main # [phone] Fax # [phone] Cell # [phone]  Office # [phone] [email or web address]

## 2018-10-14 ENCOUNTER — Other Ambulatory Visit: Payer: Self-pay

## 2018-10-14 ENCOUNTER — Other Ambulatory Visit: Payer: Medicare Other | Admitting: *Deleted

## 2018-10-14 DIAGNOSIS — E1165 Type 2 diabetes mellitus with hyperglycemia: Secondary | ICD-10-CM | POA: Diagnosis not present

## 2018-10-14 DIAGNOSIS — Z515 Encounter for palliative care: Secondary | ICD-10-CM | POA: Diagnosis not present

## 2018-10-14 DIAGNOSIS — I110 Hypertensive heart disease with heart failure: Secondary | ICD-10-CM | POA: Diagnosis not present

## 2018-10-14 DIAGNOSIS — I959 Hypotension, unspecified: Secondary | ICD-10-CM | POA: Diagnosis not present

## 2018-10-14 DIAGNOSIS — J449 Chronic obstructive pulmonary disease, unspecified: Secondary | ICD-10-CM | POA: Diagnosis not present

## 2018-10-14 DIAGNOSIS — J9621 Acute and chronic respiratory failure with hypoxia: Secondary | ICD-10-CM | POA: Diagnosis not present

## 2018-10-14 DIAGNOSIS — I5033 Acute on chronic diastolic (congestive) heart failure: Secondary | ICD-10-CM | POA: Diagnosis not present

## 2018-10-14 MED ORDER — ALPRAZOLAM 0.5 MG PO TABS: 0.5000 mg | ORAL_TABLET | Freq: Three times a day (TID) | ORAL | 0 refills | Status: DC

## 2018-10-14 NOTE — Telephone Encounter (Signed)
Reviewed [facility] controlled substance space database personally.  She has had a few different prescribers recently and for shorter durations due to being in and out of rehab.  She is due for a refill now and will refill a 0.5 mg 3 times a day.  We will monitor closely.

## 2018-10-14 NOTE — Telephone Encounter (Signed)
[place] Controlled Database Checked Last filled: See printed report on your desk LOV w/you: 08/20/18 Next appt w/you: 03/16/19

## 2018-10-15 ENCOUNTER — Ambulatory Visit: Payer: Self-pay | Admitting: *Deleted

## 2018-10-15 ENCOUNTER — Other Ambulatory Visit: Payer: Self-pay | Admitting: *Deleted

## 2018-10-15 ENCOUNTER — Other Ambulatory Visit: Payer: Medicare Other | Admitting: *Deleted

## 2018-10-15 ENCOUNTER — Other Ambulatory Visit: Payer: Self-pay

## 2018-10-15 DIAGNOSIS — Z515 Encounter for palliative care: Secondary | ICD-10-CM

## 2018-10-15 NOTE — Progress Notes (Signed)
COMMUNITY PALLIATIVE CARE RN NOTE  PATIENT NAME: [name] DOB: Sep 24, 1945 MRN: [id number]  PRIMARY CARE PROVIDER: Binnie Rail, MD  RESPONSIBLE PARTY:  Acct ID - Guarantor Home Phone Work Phone Relationship Acct Type  [id number] [facility] [phone]  Self P/F     [address] [place], [place], [place] [address]   Due to the COVID-19 crisis, this virtual check-in visit was done via telephone from my office and it was initiated and consent by this patient and or family.  PLAN OF CARE and INTERVENTION 1. ADVANCE CARE PLANNING/GOALS OF CARE: Goal is for patient to remain in her home. She is a Full code. 2. PATIENT/CAREGIVER EDUCATION: Dyspnea Management, Safe Mobility 3. DISEASE STATUS: Virtual check-in visit completed via telephone. Patient reports that she was unable to work with PT this week d/t an elevated HR in the lower to mid 100s and shortness of breath. Therapist was concerned that patient's HR keeps elevating with exertion.  Also patient states that it felt like she was starting to decline again and questions if she is trying to get some type of respiratory infection. It is starting to take longer for her to recover again at rest. She denies fever or cough. Dyspnea increasing with exertion. She remains on Oxygen at 6L/min via . Reviewed medications to encourage use of Albuterol inhaler prior to activity. Also reinforced importance of using her Incentive spirometer and Acapella valve. She has Xanax on hand if needed. She is feeling ok at time of virtual visit. Scheduled an in-person visit for tomorrow at 12:00 pm for further assessment. Will continue to monitor.   HISTORY OF PRESENT ILLNESS: [name] who resides at home alone. Palliative care team continues to follow patient. Will visit patient monthly and PRN.   CODE STATUS: Full Code ADVANCED DIRECTIVES: N MOST FORM: no PPS: 50%   (Duration of visit and documentation 30 minutes)   Daryl Eastern, RN BSN

## 2018-10-15 NOTE — Patient Outreach (Signed)
[facility]) Care Management  10/15/2018  [name] November 11, 1945 [id number]     1000 Subjective: Outreach call to patient contact number person answering phone states you will need call back later unable to confirm identity.   1630 Returned call to patient contact number, no answer able to leave a HIPAA compliant message requesting return call    Plan Will update assigned care manager of unsuccessful contact for 3rd call attempt. Unsuccessful outreach letter has been at previous outreach, for case closure on day#10.    [name], RN, [place] Management Coordinator  [phone]- Mobile [phone]- Toll Free Main Office

## 2018-10-15 NOTE — Progress Notes (Signed)
COMMUNITY PALLIATIVE CARE RN NOTE  PATIENT NAME: [name] DOB: 1945-02-28 MRN: [id number]  PRIMARY CARE PROVIDER: Binnie Rail, MD  RESPONSIBLE PARTY:  Acct ID - Guarantor Home Phone Work Phone Relationship Acct Type  [id number] [facility] [phone]  Self P/F     [address], [place], [place] [address]   Covid-19 Pre-screening Negative  PLAN OF CARE and INTERVENTION 1. ADVANCE CARE PLANNING/GOALS OF CARE: Goal is for patient to remain in her home. She is a Full code. 2. PATIENT/CAREGIVER EDUCATION: Dyspnea Management, Safe Mobility 3. DISEASE STATUS: Met with patient in her home. She is sitting up on the couch and is awake and alert. She denies pain at this time, but states that she had some brief, sharp pains in her L knee twice in the past 24 hours. Unsure of cause. Recommended that she have Tylenol on hand to help if pain persists. She says that her breathing is better today and yesterday she was able to complete an exercise session with her Physical therapist. Two days ago, patient states that she was unable to participate in therapy d/t her elevated HR to 117 with exertion. She was concerned she was starting to get an infection or having another COPD exacerbation because she started taking longer to recover again like she felt prior to her most recent hospitalization. Her lung sounds are clear, but diminished. No wheezing. Occasional cough. No congestion. Remains on Oxygen at 6L/min via [place]. She continues on Prednisone 20 mg daily, Trelegy daily. She has not been using her PRN Albuterol inhaler. Recommended that she use when she is going to exert herself, such as cooking or going to the bathroom, within frequency prescribed. She is averaging prn Xanax twice daily. Also encouraged the use of her Incentive Spirometer and Acapella valve. She continues with home PT/OT/RN through [employer]. She has a caregiver that visits 4 hours twice weekly to assist with bathing, laundry and  other household chores. Will continue to monitor.    HISTORY OF PRESENT ILLNESS:  [name] who resides at home alone. Palliative care team continues to follow patient. Will continue to visit monthly and PRN.  CODE STATUS: Full Code ADVANCED DIRECTIVES: N MOST FORM: yes PPS: 50%   PHYSICAL EXAM:   VITALS: Today's Vitals   10/15/18 1235  BP: 109/79  Pulse: 98  Resp: 20  Temp: (!) 97.3 F (36.3 C)  TempSrc: Temporal  SpO2: 90%  PainSc: 0-No pain    LUNGS: decreased breath sounds CARDIAC: Cor RRR EXTREMITIES: Trace pedal edema bilaterally SKIN: Exposed skin is dry and intact  NEURO: Alert and oriented x 3, pleasant mood, generalized weakness, ambulatory with walker   (Duration of visit and documentation 75 minutes)    Daryl Eastern, RN BSN

## 2018-10-16 ENCOUNTER — Telehealth: Payer: Self-pay | Admitting: Internal Medicine

## 2018-10-16 ENCOUNTER — Encounter: Payer: Self-pay | Admitting: *Deleted

## 2018-10-16 ENCOUNTER — Other Ambulatory Visit: Payer: Self-pay | Admitting: *Deleted

## 2018-10-16 NOTE — Telephone Encounter (Signed)
Candice with [facility] states she has been trying to get verbal orders since 9/05 for skilled nursing.  Frequency since 9/05  1 wk 1 2 wk 1 1 wk 3  1 EOW for 4 2 prns for any complications  candice direct line:  [phone]  Ext [phone] This is secure line and ok to leave VM  Would like a call back with the OK asap please

## 2018-10-16 NOTE — Patient Outreach (Signed)
[facility]) Care Management  10/16/2018  [name] Feb 25, 1945 [id number]   CSW will perform a case closure on patient, due to inability to establish initial phone contact, despite required number of phone attempts made and outreach letter mailed to patient's home allowing 10 business days for a response if patient was interested in receiving social work services through [place] with [facility].  CSW will notify patient's Telephonic RNCM with [place] Management, Sonda Rumble of CSW's plans to close patient's case.  CSW will fax an update to patient's Primary Care Physician, Dr. Billey Gosling to ensure that they are aware of CSW's involvement with patient's plan of care.    Nat Christen, BSW, MSW, LCSW  Licensed Education officer, environmental Health System  [place] [address], [place], [place] [address] Physical Address-[address], [place], [place] [address] Toll Free Main # [phone] Fax # [phone] Cell # [phone]  Office # [phone] [email or web address]

## 2018-10-16 NOTE — Telephone Encounter (Signed)
lvm with Candice with verbal okay as requested.

## 2018-10-19 ENCOUNTER — Telehealth: Payer: Self-pay | Admitting: Internal Medicine

## 2018-10-19 ENCOUNTER — Other Ambulatory Visit: Payer: Self-pay | Admitting: *Deleted

## 2018-10-19 DIAGNOSIS — J449 Chronic obstructive pulmonary disease, unspecified: Secondary | ICD-10-CM | POA: Diagnosis not present

## 2018-10-19 DIAGNOSIS — E1165 Type 2 diabetes mellitus with hyperglycemia: Secondary | ICD-10-CM | POA: Diagnosis not present

## 2018-10-19 DIAGNOSIS — I110 Hypertensive heart disease with heart failure: Secondary | ICD-10-CM | POA: Diagnosis not present

## 2018-10-19 DIAGNOSIS — I5033 Acute on chronic diastolic (congestive) heart failure: Secondary | ICD-10-CM | POA: Diagnosis not present

## 2018-10-19 DIAGNOSIS — J9621 Acute and chronic respiratory failure with hypoxia: Secondary | ICD-10-CM | POA: Diagnosis not present

## 2018-10-19 DIAGNOSIS — I959 Hypotension, unspecified: Secondary | ICD-10-CM | POA: Diagnosis not present

## 2018-10-19 MED ORDER — FLUTICASONE PROPIONATE 50 MCG/ACT NA SUSP: 2.0000 | Freq: Every day | NASAL | 4 refills | Status: DC

## 2018-10-19 MED ORDER — DOXYCYCLINE HYCLATE 100 MG PO TABS: 100.0000 mg | ORAL_TABLET | Freq: Two times a day (BID) | ORAL | 0 refills | Status: DC

## 2018-10-19 MED ORDER — PREDNISONE 10 MG PO TABS: ORAL_TABLET | ORAL | 0 refills | Status: DC

## 2018-10-19 NOTE — Telephone Encounter (Signed)
Spoke with patient. She stated she has developed an increase in SOB starting this past Saturday. She also has a productive cough with white phlegm and increased fatigue. No fever or body aches. She denies being around anyone with COVID. She stated that she feels the same way she did when she had a televisit with [facility] last month. She does not want to go to the hospital. She wants to know if she could have a rx for prednisone and an antibiotic.   [facility] on [address].   MR, please advise.

## 2018-10-19 NOTE — Telephone Encounter (Signed)
  Rx for AECOPD  Take doxycycline 100mg  po twice daily x 5 days; take after meals and avoid sunlight   Take prednisone 40 mg daily x 2 days, then 20mg  daily x 2 days, then 10mg  daily x 2 days, then 5mg  daily x 2 days and contonuie at baseline dose  Allergies  Allergen Reactions  . Ambien [Zolpidem Tartrate] Other (See Comments)    Causes nightmares

## 2018-10-19 NOTE — Telephone Encounter (Signed)
Spoke with patient. She is aware of MR's recommendations. Will go ahead and call in medications. Nothing further needed at time of call.

## 2018-10-19 NOTE — Patient Outreach (Signed)
[facility]) Care Management  10/19/2018  [name] Mar 03, 1945 [id number]   No response from patient outreach attempts will proceed with case closure.    Objective: Per KPN (Knowledge Performance Now, point of care tool) and chart review, patient hospitalized 08/26/2018 - 09/05/2018 for Acute on chronic respiratory failure with hypoxia, COPD, Acute on chronic diastolic CHF (congestive heart failure).   Patient also has a history of diabetes, hyperlipidemia, tobacco abuse, and OSA.    Assessment: Received Medicare [place] Management Post Acute Coordinator referral on 09/25/2018. Referral source: [name].   Referral reason: complex case management, active with [name]. Has [name] for aide assistance. Lives alone. High risk for readmission. Limited support. Wears home 02 and has trilogy. Discharged from [facility] today 09/25/18. Transition of care follow up completed. [name] transition of care /care coordination follow up not completed patient unable to reach and will proceed with case closure.     Plan: Case closure due to unable to reach.  RNCM will send MD case closure letter.  RNCM will send patient closure letter.   Toni Robak H. [name], BSN, [place] Management [facility] Telephonic CM Phone: [phone] Fax: [phone]

## 2018-10-21 ENCOUNTER — Other Ambulatory Visit: Payer: Medicare Other | Admitting: *Deleted

## 2018-10-21 DIAGNOSIS — Z515 Encounter for palliative care: Secondary | ICD-10-CM

## 2018-10-22 ENCOUNTER — Other Ambulatory Visit: Payer: Self-pay

## 2018-10-23 DIAGNOSIS — I959 Hypotension, unspecified: Secondary | ICD-10-CM | POA: Diagnosis not present

## 2018-10-23 DIAGNOSIS — I110 Hypertensive heart disease with heart failure: Secondary | ICD-10-CM | POA: Diagnosis not present

## 2018-10-23 DIAGNOSIS — J9621 Acute and chronic respiratory failure with hypoxia: Secondary | ICD-10-CM | POA: Diagnosis not present

## 2018-10-23 DIAGNOSIS — I5033 Acute on chronic diastolic (congestive) heart failure: Secondary | ICD-10-CM | POA: Diagnosis not present

## 2018-10-23 DIAGNOSIS — J449 Chronic obstructive pulmonary disease, unspecified: Secondary | ICD-10-CM | POA: Diagnosis not present

## 2018-10-23 DIAGNOSIS — E1165 Type 2 diabetes mellitus with hyperglycemia: Secondary | ICD-10-CM | POA: Diagnosis not present

## 2018-10-26 DIAGNOSIS — E669 Obesity, unspecified: Secondary | ICD-10-CM | POA: Diagnosis not present

## 2018-10-26 DIAGNOSIS — Z6836 Body mass index (BMI) 36.0-36.9, adult: Secondary | ICD-10-CM | POA: Diagnosis not present

## 2018-10-26 DIAGNOSIS — Z7952 Long term (current) use of systemic steroids: Secondary | ICD-10-CM | POA: Diagnosis not present

## 2018-10-26 DIAGNOSIS — J9621 Acute and chronic respiratory failure with hypoxia: Secondary | ICD-10-CM | POA: Diagnosis not present

## 2018-10-26 DIAGNOSIS — G4733 Obstructive sleep apnea (adult) (pediatric): Secondary | ICD-10-CM | POA: Diagnosis not present

## 2018-10-26 DIAGNOSIS — K219 Gastro-esophageal reflux disease without esophagitis: Secondary | ICD-10-CM | POA: Diagnosis not present

## 2018-10-26 DIAGNOSIS — I5033 Acute on chronic diastolic (congestive) heart failure: Secondary | ICD-10-CM | POA: Diagnosis not present

## 2018-10-26 DIAGNOSIS — Z794 Long term (current) use of insulin: Secondary | ICD-10-CM | POA: Diagnosis not present

## 2018-10-26 DIAGNOSIS — Z7951 Long term (current) use of inhaled steroids: Secondary | ICD-10-CM | POA: Diagnosis not present

## 2018-10-26 DIAGNOSIS — J449 Chronic obstructive pulmonary disease, unspecified: Secondary | ICD-10-CM | POA: Diagnosis not present

## 2018-10-26 DIAGNOSIS — F329 Major depressive disorder, single episode, unspecified: Secondary | ICD-10-CM | POA: Diagnosis not present

## 2018-10-26 DIAGNOSIS — E1165 Type 2 diabetes mellitus with hyperglycemia: Secondary | ICD-10-CM | POA: Diagnosis not present

## 2018-10-26 DIAGNOSIS — Z9981 Dependence on supplemental oxygen: Secondary | ICD-10-CM | POA: Diagnosis not present

## 2018-10-26 DIAGNOSIS — Z9181 History of falling: Secondary | ICD-10-CM | POA: Diagnosis not present

## 2018-10-26 DIAGNOSIS — E876 Hypokalemia: Secondary | ICD-10-CM | POA: Diagnosis not present

## 2018-10-26 DIAGNOSIS — E7849 Other hyperlipidemia: Secondary | ICD-10-CM | POA: Diagnosis not present

## 2018-10-26 DIAGNOSIS — I959 Hypotension, unspecified: Secondary | ICD-10-CM | POA: Diagnosis not present

## 2018-10-26 DIAGNOSIS — I110 Hypertensive heart disease with heart failure: Secondary | ICD-10-CM | POA: Diagnosis not present

## 2018-10-26 DIAGNOSIS — F17210 Nicotine dependence, cigarettes, uncomplicated: Secondary | ICD-10-CM | POA: Diagnosis not present

## 2018-10-26 DIAGNOSIS — F419 Anxiety disorder, unspecified: Secondary | ICD-10-CM | POA: Diagnosis not present

## 2018-10-26 NOTE — Progress Notes (Signed)
COMMUNITY PALLIATIVE CARE RN NOTE  PATIENT NAME: [name] DOB: 1945/05/08 MRN: [id number]  PRIMARY CARE PROVIDER: Binnie Rail, MD  RESPONSIBLE PARTY:  Acct ID - Guarantor Home Phone Work Phone Relationship Acct Type  [id number] [facility] [phone]  Self P/F     [address] [place], [place], [place] [address]   Due to the COVID-19 crisis, this virtual check-in visit was done via telephone from my office and it was initiated and consent by this patient and or family.  PLAN OF CARE and INTERVENTION:  1. ADVANCE CARE PLANNING/GOALS OF CARE: Goal is for patient to remain in her home. She is a Full code. 2. PATIENT/CAREGIVER EDUCATION: Dyspnea management, Safe Mobility/Transfers 3. DISEASE STATUS: Virtual check-in visit completed via telephone. Patient denies pain at this time. She does say that she has started experiencing increased dyspnea with exertion and is starting to take longer to recover which started over the weekend. She states that her oxygen level drops in the 70s on 6L/min via  with exertion. Yesterday, she was placed on a Prednisone taper and antibiotics by her Pulmonologist. She says that currently her oxygen is in the low 90s at rest. She reports a congested, productive cough with white phlegm usually occurring at night and first thing in the morning. She continues to work with PT/OT, but some days are unable to participate d/t elevated HR in the 110s or low oxygen sat. She says that she feels slightly better today. She remains ambulatory using her walker. Intermittent urinary incontinence and wears Depends. She states that earlier today that she was able to make her a sandwich in the kitchen. She continues with a hired caregiver 2x/weekly for 4 hours each day. Will continue to monitor.  HISTORY OF PRESENT ILLNESS:  [name] who resides at home alone. Palliative care team continues to follow patient. Will continue to visit patient monthly and PRN.   CODE  STATUS: Full code ADVANCED DIRECTIVES: N MOST FORM: yes PPS: 50%   (Duration of visit and documentation 30 minutes)   Daryl Eastern, RN BSN

## 2018-10-28 ENCOUNTER — Other Ambulatory Visit: Payer: Medicare Other | Admitting: *Deleted

## 2018-10-28 ENCOUNTER — Other Ambulatory Visit: Payer: Self-pay

## 2018-10-28 DIAGNOSIS — I110 Hypertensive heart disease with heart failure: Secondary | ICD-10-CM | POA: Diagnosis not present

## 2018-10-28 DIAGNOSIS — J9621 Acute and chronic respiratory failure with hypoxia: Secondary | ICD-10-CM | POA: Diagnosis not present

## 2018-10-28 DIAGNOSIS — Z515 Encounter for palliative care: Secondary | ICD-10-CM

## 2018-10-28 DIAGNOSIS — I959 Hypotension, unspecified: Secondary | ICD-10-CM | POA: Diagnosis not present

## 2018-10-28 DIAGNOSIS — J449 Chronic obstructive pulmonary disease, unspecified: Secondary | ICD-10-CM | POA: Diagnosis not present

## 2018-10-28 DIAGNOSIS — I5033 Acute on chronic diastolic (congestive) heart failure: Secondary | ICD-10-CM | POA: Diagnosis not present

## 2018-10-28 DIAGNOSIS — E1165 Type 2 diabetes mellitus with hyperglycemia: Secondary | ICD-10-CM | POA: Diagnosis not present

## 2018-10-30 ENCOUNTER — Other Ambulatory Visit: Payer: Self-pay | Admitting: *Deleted

## 2018-10-30 NOTE — Progress Notes (Signed)
COMMUNITY PALLIATIVE CARE RN NOTE  PATIENT NAME: [name] DOB: 23-Nov-1945 MRN: [id number]  PRIMARY CARE PROVIDER: Binnie Rail, MD  RESPONSIBLE PARTY:  Acct ID - Guarantor Home Phone Work Phone Relationship Acct Type  [id number] [facility] [phone]  Self P/F     [address] [place], [place], [place] [address]   Due to the COVID-19 crisis, this virtual check-in visit was done via telephone from my office and it was initiated and consent by this patient and or family.  PLAN OF CARE and INTERVENTION:  1. ADVANCE CARE PLANNING/GOALS OF CARE: Goal is for patient to remain in her home. She is a Full Code. 2. PATIENT/CAREGIVER EDUCATION: Dyspnea Management, Safe Mobility 3. DISEASE STATUS: Virtual check-in visit completed via telephone. Patient c/o increased shortness of breath today. She recently completed a course of antibiotics and a Prednisone taper. Initially, for about she reports feeling better after completion, but today dyspnea has worsened. She says her sats dropped all the way in the 60s, even while at rest. Dyspnea worsens with exertion and when she first wakes up in the morning. She remains on 6L/min via [place]. She took a Xanax earlier today and her Albuterol inhaler once PRN, but this is all. Reinforced that she can have Xanax up to 3x/day and can use her Albuterol inhaler every 6 hours PRN as well. She also has a PRN nebulizer treatment she can utilize, but she has not been using this. She says that she forgets about what to do at times. She continues to refuse wanting to be evaluated in the hospital or transition to hospice care. Her PT visited earlier today and she was able to sit outside some, but unable to perform exercises. Will continue to monitor.    HISTORY OF PRESENT ILLNESS:  [name] who resides at home alone. Palliative care team continues to follow patient. Will continue to visit patient monthly and PRN.  CODE STATUS: Full Code ADVANCED DIRECTIVES:  N MOST FORM: yes PPS: 50%   (Duration of visit and documentation 30 minutes)   Daryl Eastern, RN BSN

## 2018-10-30 NOTE — Patient Outreach (Signed)
[facility]) Care Management  10/30/2018  [name] [birth year] [id number]   Subjective: Received voicemail from [place] on 10/27/2018, states she received a case closure letter, and request call back regarding program.   Telephone call to patient home/ mobile number, no answer, no voicemail / answering machine available, and unable to leave message.    Objective:Per KPN (Knowledge Performance Now, point of care tool) and chart review,patient hospitalized 08/26/2018 - 09/05/2018 forAcute on chronic respiratory failure with hypoxia, COPD,Acute on chronic diastolic CHF (congestive heart failure). Patient also has a history of diabetes, hyperlipidemia, tobacco abuse, and OSA.    Assessment:  Received Medicare [place] Management Post Acute Coordinator referral on 09/25/2018. Referral source: [name]. Referral reason: complex case management, active with [name]. Has [name] for aide assistance. Lives alone. High risk for readmission. Limited support. Wears home 02 and has trilogy. Discharged from [facility] 09/25/18. Transition of care follow up completed. [name] transition of care /care coordination follow up not completed patient unable to reach and case closed 10/19/2018.   Screening  follow up pending patient contact.     Plan:  Case remains closed.   RNCM will make 1 additional attempt call to patient to follow up, regarding program case closure within 4 business days.      [name] H. [name], BSN, [place] Management [facility] Telephonic CM Phone: [phone] Fax: [phone]

## 2018-11-02 ENCOUNTER — Encounter (HOSPITAL_COMMUNITY): Payer: Self-pay | Admitting: Emergency Medicine

## 2018-11-02 ENCOUNTER — Other Ambulatory Visit: Payer: Self-pay

## 2018-11-02 ENCOUNTER — Encounter: Payer: Self-pay | Admitting: Pulmonary Disease

## 2018-11-02 ENCOUNTER — Telehealth: Payer: Self-pay | Admitting: Internal Medicine

## 2018-11-02 ENCOUNTER — Inpatient Hospital Stay (HOSPITAL_COMMUNITY)
Admission: EM | Admit: 2018-11-02 | Discharge: 2018-11-06 | DRG: 190 | Disposition: A | Discharge status: Home Health | Payer: Medicare Other | Attending: Internal Medicine | Admitting: Internal Medicine

## 2018-11-02 ENCOUNTER — Ambulatory Visit (INDEPENDENT_AMBULATORY_CARE_PROVIDER_SITE_OTHER): Payer: Medicare Other | Admitting: Pulmonary Disease

## 2018-11-02 ENCOUNTER — Other Ambulatory Visit: Payer: Medicare Other | Admitting: *Deleted

## 2018-11-02 ENCOUNTER — Emergency Department (HOSPITAL_COMMUNITY): Payer: Medicare Other

## 2018-11-02 DIAGNOSIS — Z6837 Body mass index (BMI) 37.0-37.9, adult: Secondary | ICD-10-CM | POA: Diagnosis not present

## 2018-11-02 DIAGNOSIS — E782 Mixed hyperlipidemia: Secondary | ICD-10-CM

## 2018-11-02 DIAGNOSIS — Z9981 Dependence on supplemental oxygen: Secondary | ICD-10-CM

## 2018-11-02 DIAGNOSIS — J9621 Acute and chronic respiratory failure with hypoxia: Secondary | ICD-10-CM | POA: Diagnosis present

## 2018-11-02 DIAGNOSIS — Z79899 Other long term (current) drug therapy: Secondary | ICD-10-CM | POA: Diagnosis not present

## 2018-11-02 DIAGNOSIS — J9622 Acute and chronic respiratory failure with hypercapnia: Secondary | ICD-10-CM | POA: Diagnosis present

## 2018-11-02 DIAGNOSIS — D509 Iron deficiency anemia, unspecified: Secondary | ICD-10-CM | POA: Diagnosis present

## 2018-11-02 DIAGNOSIS — I272 Pulmonary hypertension, unspecified: Secondary | ICD-10-CM | POA: Diagnosis present

## 2018-11-02 DIAGNOSIS — Z90710 Acquired absence of both cervix and uterus: Secondary | ICD-10-CM

## 2018-11-02 DIAGNOSIS — Z20828 Contact with and (suspected) exposure to other viral communicable diseases: Secondary | ICD-10-CM | POA: Diagnosis present

## 2018-11-02 DIAGNOSIS — R5381 Other malaise: Secondary | ICD-10-CM | POA: Diagnosis present

## 2018-11-02 DIAGNOSIS — E6609 Other obesity due to excess calories: Secondary | ICD-10-CM | POA: Diagnosis not present

## 2018-11-02 DIAGNOSIS — J9601 Acute respiratory failure with hypoxia: Secondary | ICD-10-CM | POA: Diagnosis not present

## 2018-11-02 DIAGNOSIS — D72829 Elevated white blood cell count, unspecified: Secondary | ICD-10-CM | POA: Diagnosis present

## 2018-11-02 DIAGNOSIS — I110 Hypertensive heart disease with heart failure: Secondary | ICD-10-CM | POA: Diagnosis present

## 2018-11-02 DIAGNOSIS — F17210 Nicotine dependence, cigarettes, uncomplicated: Secondary | ICD-10-CM | POA: Diagnosis present

## 2018-11-02 DIAGNOSIS — Z7951 Long term (current) use of inhaled steroids: Secondary | ICD-10-CM

## 2018-11-02 DIAGNOSIS — E876 Hypokalemia: Secondary | ICD-10-CM | POA: Diagnosis present

## 2018-11-02 DIAGNOSIS — Z8541 Personal history of malignant neoplasm of cervix uteri: Secondary | ICD-10-CM | POA: Diagnosis not present

## 2018-11-02 DIAGNOSIS — E669 Obesity, unspecified: Secondary | ICD-10-CM | POA: Diagnosis present

## 2018-11-02 DIAGNOSIS — J479 Bronchiectasis, uncomplicated: Secondary | ICD-10-CM

## 2018-11-02 DIAGNOSIS — K219 Gastro-esophageal reflux disease without esophagitis: Secondary | ICD-10-CM | POA: Diagnosis not present

## 2018-11-02 DIAGNOSIS — J471 Bronchiectasis with (acute) exacerbation: Secondary | ICD-10-CM

## 2018-11-02 DIAGNOSIS — Z23 Encounter for immunization: Secondary | ICD-10-CM

## 2018-11-02 DIAGNOSIS — J9612 Chronic respiratory failure with hypercapnia: Secondary | ICD-10-CM | POA: Diagnosis not present

## 2018-11-02 DIAGNOSIS — Z515 Encounter for palliative care: Secondary | ICD-10-CM | POA: Diagnosis not present

## 2018-11-02 DIAGNOSIS — E785 Hyperlipidemia, unspecified: Secondary | ICD-10-CM | POA: Diagnosis present

## 2018-11-02 DIAGNOSIS — Z9119 Patient's noncompliance with other medical treatment and regimen: Secondary | ICD-10-CM | POA: Diagnosis not present

## 2018-11-02 DIAGNOSIS — Z794 Long term (current) use of insulin: Secondary | ICD-10-CM

## 2018-11-02 DIAGNOSIS — E119 Type 2 diabetes mellitus without complications: Secondary | ICD-10-CM

## 2018-11-02 DIAGNOSIS — J441 Chronic obstructive pulmonary disease with (acute) exacerbation: Secondary | ICD-10-CM

## 2018-11-02 DIAGNOSIS — F329 Major depressive disorder, single episode, unspecified: Secondary | ICD-10-CM | POA: Diagnosis present

## 2018-11-02 DIAGNOSIS — Z6834 Body mass index (BMI) 34.0-34.9, adult: Secondary | ICD-10-CM | POA: Diagnosis not present

## 2018-11-02 DIAGNOSIS — Z7189 Other specified counseling: Secondary | ICD-10-CM

## 2018-11-02 DIAGNOSIS — E1165 Type 2 diabetes mellitus with hyperglycemia: Secondary | ICD-10-CM | POA: Diagnosis present

## 2018-11-02 DIAGNOSIS — Z7952 Long term (current) use of systemic steroids: Secondary | ICD-10-CM

## 2018-11-02 DIAGNOSIS — J9611 Chronic respiratory failure with hypoxia: Secondary | ICD-10-CM | POA: Diagnosis not present

## 2018-11-02 DIAGNOSIS — F411 Generalized anxiety disorder: Secondary | ICD-10-CM | POA: Diagnosis present

## 2018-11-02 DIAGNOSIS — R0902 Hypoxemia: Secondary | ICD-10-CM | POA: Diagnosis not present

## 2018-11-02 DIAGNOSIS — R000 Tachycardia, unspecified: Secondary | ICD-10-CM | POA: Diagnosis not present

## 2018-11-02 DIAGNOSIS — R0602 Shortness of breath: Secondary | ICD-10-CM | POA: Diagnosis not present

## 2018-11-02 DIAGNOSIS — I5032 Chronic diastolic (congestive) heart failure: Secondary | ICD-10-CM | POA: Diagnosis present

## 2018-11-02 DIAGNOSIS — Z720 Tobacco use: Secondary | ICD-10-CM

## 2018-11-02 DIAGNOSIS — R0689 Other abnormalities of breathing: Secondary | ICD-10-CM | POA: Diagnosis not present

## 2018-11-02 LAB — POCT I-STAT 7, (LYTES, BLD GAS, ICA,H+H)
Acid-Base Excess: 9 mmol/L — ABNORMAL HIGH (ref 0.0–2.0)
Bicarbonate: 35.3 mmol/L — ABNORMAL HIGH (ref 20.0–28.0)
Calcium, Ion: 1.12 mmol/L — ABNORMAL LOW (ref 1.15–1.40)
HCT: 37 % (ref 36.0–46.0)
Hemoglobin: 12.6 g/dL (ref 12.0–15.0)
O2 Saturation: 92 %
Patient temperature: 98
Potassium: 3.1 mmol/L — ABNORMAL LOW (ref 3.5–5.1)
Sodium: 143 mmol/L (ref 135–145)
TCO2: 37 mmol/L — ABNORMAL HIGH (ref 22–32)
pCO2 arterial: 56.5 mmHg — ABNORMAL HIGH (ref 32.0–48.0)
pH, Arterial: 7.403 (ref 7.350–7.450)
pO2, Arterial: 63 mmHg — ABNORMAL LOW (ref 83.0–108.0)

## 2018-11-02 LAB — CBC WITH DIFFERENTIAL/PLATELET
Abs Immature Granulocytes: 0.08 10*3/uL — ABNORMAL HIGH (ref 0.00–0.07)
Basophils Absolute: 0.1 10*3/uL (ref 0.0–0.1)
Basophils Relative: 1 %
Eosinophils Absolute: 0.1 10*3/uL (ref 0.0–0.5)
Eosinophils Relative: 1 %
HCT: 41.2 % (ref 36.0–46.0)
Hemoglobin: 10.3 g/dL — ABNORMAL LOW (ref 12.0–15.0)
Immature Granulocytes: 1 %
Lymphocytes Relative: 13 %
Lymphs Abs: 1.3 10*3/uL (ref 0.7–4.0)
MCH: 20 pg — ABNORMAL LOW (ref 26.0–34.0)
MCHC: 25 g/dL — ABNORMAL LOW (ref 30.0–36.0)
MCV: 79.8 fL — ABNORMAL LOW (ref 80.0–100.0)
Monocytes Absolute: 1.1 10*3/uL — ABNORMAL HIGH (ref 0.1–1.0)
Monocytes Relative: 11 %
Neutro Abs: 8 10*3/uL — ABNORMAL HIGH (ref 1.7–7.7)
Neutrophils Relative %: 73 %
Platelets: 361 10*3/uL (ref 150–400)
RBC: 5.16 MIL/uL — ABNORMAL HIGH (ref 3.87–5.11)
RDW: 19.7 % — ABNORMAL HIGH (ref 11.5–15.5)
WBC: 10.7 10*3/uL — ABNORMAL HIGH (ref 4.0–10.5)
nRBC: 1.9 % — ABNORMAL HIGH (ref 0.0–0.2)

## 2018-11-02 LAB — COMPREHENSIVE METABOLIC PANEL
ALT: 17 U/L (ref 0–44)
AST: 19 U/L (ref 15–41)
Albumin: 3.5 g/dL (ref 3.5–5.0)
Alkaline Phosphatase: 69 U/L (ref 38–126)
Anion gap: 13 (ref 5–15)
BUN: 12 mg/dL (ref 8–23)
CO2: 30 mmol/L (ref 22–32)
Calcium: 8.7 mg/dL — ABNORMAL LOW (ref 8.9–10.3)
Chloride: 99 mmol/L (ref 98–111)
Creatinine, Ser: 0.96 mg/dL (ref 0.44–1.00)
GFR calc Af Amer: >60 mL/min (ref >60)
GFR calc non Af Amer: 60 mL/min — ABNORMAL LOW (ref >60)
Glucose, Bld: 209 mg/dL — ABNORMAL HIGH (ref 70–99)
Potassium: 3.2 mmol/L — ABNORMAL LOW (ref 3.5–5.1)
Sodium: 142 mmol/L (ref 135–145)
Total Bilirubin: 0.5 mg/dL (ref 0.3–1.2)
Total Protein: 6.6 g/dL (ref 6.5–8.1)

## 2018-11-02 LAB — HEMOGLOBIN A1C
Hgb A1c MFr Bld: 9.4 % — ABNORMAL HIGH (ref 4.8–5.6)
Mean Plasma Glucose: 223.08 mg/dL

## 2018-11-02 LAB — BRAIN NATRIURETIC PEPTIDE: B Natriuretic Peptide: 72.3 pg/mL (ref 0.0–100.0)

## 2018-11-02 LAB — CBG MONITORING, ED: Glucose-Capillary: 256 mg/dL — ABNORMAL HIGH (ref 70–99)

## 2018-11-02 LAB — TROPONIN I (HIGH SENSITIVITY): Troponin I (High Sensitivity): 11 ng/L (ref <18)

## 2018-11-02 LAB — SARS CORONAVIRUS 2 BY RT PCR (HOSPITAL ORDER, PERFORMED IN ~~LOC~~ HOSPITAL LAB): SARS Coronavirus 2: NEGATIVE

## 2018-11-02 LAB — D-DIMER, QUANTITATIVE: D-Dimer, Quant: 0.39 ug/mL-FEU (ref 0.00–0.50)

## 2018-11-02 MED ORDER — ACETAMINOPHEN 325 MG PO TABS: 650.0000 mg | ORAL_TABLET | Freq: Four times a day (QID) | ORAL | Status: DC | PRN

## 2018-11-02 MED ORDER — IPRATROPIUM-ALBUTEROL 0.5-2.5 (3) MG/3ML IN SOLN
3.0000 mL | RESPIRATORY_TRACT | Status: AC
  Administered 2018-11-02 (×3): 3 mL via RESPIRATORY_TRACT
  Filled 2018-11-02: qty 3

## 2018-11-02 MED ORDER — INSULIN GLARGINE 100 UNIT/ML ~~LOC~~ SOLN
32.0000 [IU] | Freq: Every day | SUBCUTANEOUS | Status: DC
  Administered 2018-11-03 – 2018-11-05 (×4): 32 [IU] via SUBCUTANEOUS
  Filled 2018-11-02 (×5): qty 0.32

## 2018-11-02 MED ORDER — ACETAZOLAMIDE 250 MG PO TABS
250.0000 mg | ORAL_TABLET | Freq: Two times a day (BID) | ORAL | Status: DC
  Administered 2018-11-03 – 2018-11-06 (×7): 250 mg via ORAL
  Filled 2018-11-02 (×9): qty 1

## 2018-11-02 MED ORDER — SODIUM CHLORIDE 0.9% FLUSH: 3.0000 mL | INTRAVENOUS | Status: DC | PRN

## 2018-11-02 MED ORDER — LEVOFLOXACIN IN D5W 750 MG/150ML IV SOLN
750.0000 mg | Freq: Once | INTRAVENOUS | Status: AC
  Administered 2018-11-02: 750 mg via INTRAVENOUS
  Filled 2018-11-02: qty 150

## 2018-11-02 MED ORDER — AZITHROMYCIN 500 MG PO TABS
500.0000 mg | ORAL_TABLET | Freq: Every day | ORAL | Status: DC
  Administered 2018-11-04 – 2018-11-06 (×3): 500 mg via ORAL
  Filled 2018-11-02 (×4): qty 1

## 2018-11-02 MED ORDER — ALBUTEROL SULFATE (2.5 MG/3ML) 0.083% IN NEBU: 2.5000 mg | INHALATION_SOLUTION | RESPIRATORY_TRACT | Status: DC | PRN

## 2018-11-02 MED ORDER — INSULIN ASPART 100 UNIT/ML ~~LOC~~ SOLN
0.0000 [IU] | SUBCUTANEOUS | Status: DC
  Administered 2018-11-02: 5 [IU] via SUBCUTANEOUS
  Administered 2018-11-03 (×2): 3 [IU] via SUBCUTANEOUS

## 2018-11-02 MED ORDER — SODIUM CHLORIDE 0.9% FLUSH
3.0000 mL | Freq: Two times a day (BID) | INTRAVENOUS | Status: DC
  Administered 2018-11-02 – 2018-11-04 (×4): 3 mL via INTRAVENOUS

## 2018-11-02 MED ORDER — SODIUM CHLORIDE 0.9% FLUSH
3.0000 mL | Freq: Two times a day (BID) | INTRAVENOUS | Status: DC
  Administered 2018-11-02 – 2018-11-06 (×7): 3 mL via INTRAVENOUS

## 2018-11-02 MED ORDER — ACETAMINOPHEN 650 MG RE SUPP: 650.0000 mg | Freq: Four times a day (QID) | RECTAL | Status: DC | PRN

## 2018-11-02 MED ORDER — ONDANSETRON HCL 4 MG PO TABS: 4.0000 mg | ORAL_TABLET | Freq: Four times a day (QID) | ORAL | Status: DC | PRN

## 2018-11-02 MED ORDER — SODIUM CHLORIDE 0.9 % IV SOLN
250.0000 mL | INTRAVENOUS | Status: DC | PRN
  Administered 2018-11-03: 250 mL via INTRAVENOUS

## 2018-11-02 MED ORDER — MOMETASONE FURO-FORMOTEROL FUM 200-5 MCG/ACT IN AERO
1.0000 | INHALATION_SPRAY | Freq: Two times a day (BID) | RESPIRATORY_TRACT | Status: DC
  Filled 2018-11-02: qty 8.8

## 2018-11-02 MED ORDER — PANTOPRAZOLE SODIUM 40 MG PO TBEC
40.0000 mg | DELAYED_RELEASE_TABLET | Freq: Every day | ORAL | Status: DC
  Administered 2018-11-03 – 2018-11-06 (×4): 40 mg via ORAL
  Filled 2018-11-02 (×4): qty 1

## 2018-11-02 MED ORDER — METHYLPREDNISOLONE SODIUM SUCC 125 MG IJ SOLR
60.0000 mg | Freq: Two times a day (BID) | INTRAMUSCULAR | Status: AC
  Administered 2018-11-02 – 2018-11-03 (×2): 60 mg via INTRAVENOUS
  Filled 2018-11-02 (×2): qty 2

## 2018-11-02 MED ORDER — HYDROCODONE-ACETAMINOPHEN 5-325 MG PO TABS: 1.0000 | ORAL_TABLET | ORAL | Status: DC | PRN

## 2018-11-02 MED ORDER — ONDANSETRON HCL 4 MG/2ML IJ SOLN: 4.0000 mg | Freq: Four times a day (QID) | INTRAMUSCULAR | Status: DC | PRN

## 2018-11-02 MED ORDER — PAROXETINE HCL 20 MG PO TABS
40.0000 mg | ORAL_TABLET | Freq: Every morning | ORAL | Status: DC
  Administered 2018-11-03 – 2018-11-06 (×4): 40 mg via ORAL
  Filled 2018-11-02 (×5): qty 2

## 2018-11-02 MED ORDER — SODIUM CHLORIDE 0.9 % IV SOLN
500.0000 mg | INTRAVENOUS | Status: AC
  Administered 2018-11-03: 500 mg via INTRAVENOUS
  Filled 2018-11-02: qty 500

## 2018-11-02 MED ORDER — PREDNISONE 20 MG PO TABS
40.0000 mg | ORAL_TABLET | Freq: Every day | ORAL | Status: DC
  Administered 2018-11-04 – 2018-11-06 (×3): 40 mg via ORAL
  Filled 2018-11-02 (×3): qty 2

## 2018-11-02 MED ORDER — MAGNESIUM SULFATE 2 GM/50ML IV SOLN
2.0000 g | Freq: Once | INTRAVENOUS | Status: AC
  Administered 2018-11-02: 2 g via INTRAVENOUS
  Filled 2018-11-02: qty 50

## 2018-11-02 MED ORDER — IPRATROPIUM-ALBUTEROL 20-100 MCG/ACT IN AERS
2.0000 | INHALATION_SPRAY | Freq: Two times a day (BID) | RESPIRATORY_TRACT | Status: DC
  Administered 2018-11-02 – 2018-11-03 (×2): 2 via RESPIRATORY_TRACT
  Filled 2018-11-02 (×2): qty 4

## 2018-11-02 MED ORDER — ALPRAZOLAM 0.25 MG PO TABS
0.5000 mg | ORAL_TABLET | Freq: Three times a day (TID) | ORAL | Status: DC
  Administered 2018-11-03 (×2): 0.5 mg via ORAL
  Filled 2018-11-02 (×2): qty 2

## 2018-11-02 MED ORDER — ALBUTEROL SULFATE HFA 108 (90 BASE) MCG/ACT IN AERS
8.0000 | INHALATION_SPRAY | Freq: Once | RESPIRATORY_TRACT | Status: AC
  Administered 2018-11-02: 8 via RESPIRATORY_TRACT
  Filled 2018-11-02: qty 6.7

## 2018-11-02 MED ORDER — ENOXAPARIN SODIUM 40 MG/0.4ML ~~LOC~~ SOLN
40.0000 mg | SUBCUTANEOUS | Status: DC
  Administered 2018-11-02 – 2018-11-05 (×4): 40 mg via SUBCUTANEOUS
  Filled 2018-11-02 (×5): qty 0.4

## 2018-11-02 MED ORDER — METHYLPREDNISOLONE SODIUM SUCC 125 MG IJ SOLR
125.0000 mg | Freq: Once | INTRAMUSCULAR | Status: AC
  Administered 2018-11-02: 125 mg via INTRAVENOUS
  Filled 2018-11-02: qty 2

## 2018-11-02 MED ORDER — POTASSIUM CHLORIDE 10 MEQ/100ML IV SOLN
10.0000 meq | INTRAVENOUS | Status: AC
  Administered 2018-11-02 – 2018-11-03 (×4): 10 meq via INTRAVENOUS
  Filled 2018-11-02 (×4): qty 100

## 2018-11-02 MED ORDER — FUROSEMIDE 40 MG PO TABS
40.0000 mg | ORAL_TABLET | Freq: Every day | ORAL | Status: DC
  Administered 2018-11-03 – 2018-11-06 (×4): 40 mg via ORAL
  Filled 2018-11-02: qty 2
  Filled 2018-11-02 (×3): qty 1

## 2018-11-02 MED ORDER — IPRATROPIUM-ALBUTEROL 0.5-2.5 (3) MG/3ML IN SOLN
3.0000 mL | Freq: Four times a day (QID) | RESPIRATORY_TRACT | Status: AC
  Administered 2018-11-02 – 2018-11-03 (×3): 3 mL via RESPIRATORY_TRACT
  Filled 2018-11-02 (×3): qty 3

## 2018-11-02 NOTE — Assessment & Plan Note (Signed)
Plan: Contact 911 for emergency room evaluation  Continue oxygen therapy at 6 L as prescribed Patient will need close follow-up with our office for further evaluation

## 2018-11-02 NOTE — ED Triage Notes (Signed)
Pt arrives to ED from home with complaints of shortness of breath starting yesterday that has worsened. On EMS arrival to patient their O2 saturation was 79%-81%. Patient was then  Placed on CPAP which took the patients O2 saturation to 93%. Patient has no complaints of pain.

## 2018-11-02 NOTE — Progress Notes (Signed)
Patient was trailed off the NIV machine and quickly oxygen saturations began to decline into the low 70's. Hypoxia noted. Patient has no O2 reserve. Patient was placed back on NIV. Patient is currently requiring 60% FiO2. Patient is c/o hunger and wanting something to eat. Goal is to maintain good ventilation and oxygenation via utilization of NIV machine using appropriate settings in order to meet systemic and myocardial oxygen demands to assure adequate tissue oxygenation/perfusion. Patient is currently back on BiPAP tolerating it fairly well. No complications noted at this time. Saturations are stable .   Salma Walrond L. Jennette Kettle, RRT, RCP

## 2018-11-02 NOTE — ED Provider Notes (Signed)
[facility] EMERGENCY DEPARTMENT Provider Note   CSN: [id number] Arrival date & time: 11/02/18  1705     History   Chief Complaint Chief Complaint  Patient presents with  . Shortness of Breath    HPI [name] is a 73 y.o. female.     73 yo F with a significant past medical history of COPD requiring 6 L of oxygen at all times comes with a chief complaint of shortness of breath.  Feels like it worsened last night.  She was able to sleep okay but every time she got up to move around her breathing got much worse.  She called her pulmonologist who told her to come to the ED.  She has had a mild cough but not more than normal.  No production.  No fevers.  Denies chest pain.  Denies any worsening edema.  No known sick contacts.  The history is provided by the patient.  Shortness of Breath Severity:  Moderate Onset quality:  Gradual Duration:  2 days Timing:  Constant Progression:  Worsening Chronicity:  New Context: activity   Relieved by:  Nothing Worsened by:  Nothing Ineffective treatments:  None tried Associated symptoms: cough   Associated symptoms: no chest pain, no fever, no headaches, no vomiting and no wheezing     Past Medical History:  Diagnosis Date  . Arrhythmia   . Bronchiectasis march 2012   On CT chest. RUL. Mild  . CHF (congestive heart failure) ([place])   . Chicken pox   . Chronic diastolic heart failure ([place])   . Chronic respiratory failure ([place])    Followed in Pulmonary clinic/ [facility]/ Ramaswamy  - 06/30/2012 desat to 86%  RA walking 50 ft, recovered to 90% at rest - 06/30/2012  Walked 1lpm x 3 laps @ 185 ft each stopped due to  Sob, no desat  rec 02 2lpm with activity and sleeping, ok at rest   . COPD (chronic obstructive pulmonary disease) ([place])     FEV-1 in 2008 was 63% with a diffusion capacity of 33%.   . Generalized anxiety disorder   . History of cervical cancer 1982  . Hyperlipidemia   . Hypertension   . Leg  swelling    Venous doppler right 07/21/12 >>Neg    . 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 Medical history non-contributory   . Medical non-compliance   . MITRAL VALVE PROLAPSE   . Obesity, unspecified   . Pulmonary nodule 03/2010   65mm RUL and RLL 1st seen march 2012 CT, ?progression 12/2012 CT  . Seasonal allergies   . Tobacco abuse     Smokes one pack a day since age 43.    Patient Active Problem List   Diagnosis Date Noted  . Acute on chronic respiratory failure with hypoxia ([place]) 08/26/2018  . Hyperkalemia 08/26/2018  . COPD with acute exacerbation ([place]) 01/26/2018  . Psoriasis 07/01/2017  . Depression 06/24/2016  . Pneumonia of both lungs due to infectious organism   . Chronic respiratory failure with hypoxia ([place])   . Pulmonary hypertension ([place])   . Physical deconditioning 11/15/2015  . GERD (gastroesophageal reflux disease) 11/01/2015  . 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 Diabetes ([place]) 01/23/2015  . Fatigue 01/20/2015  . Chronic obstructive pulmonary disease ([place]) 10/18/2014  . Obesity, unspecified 03/15/2013  . Acute on chronic diastolic CHF (congestive heart failure) ([place]) 03/14/2013  . Chronic respiratory failure ([place]) 06/30/2012  .  COPD (chronic obstructive pulmonary disease) ([place])   . Tobacco abuse   . Pulmonary nodule 03/22/2010  . 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 Bronchiectasis 03/22/2010  . Hyperlipidemia 06/13/2008  . Anxiety 02/20/2007  . MITRAL VALVE PROLAPSE 02/20/2007  . CERVICAL CANCER, HX OF 02/20/2007    Past Surgical History:  Procedure Laterality Date  . TOTAL ABDOMINAL HYSTERECTOMY       OB History   No obstetric history on file.      Home Medications    Prior to Admission medications   Medication Sig Start Date End Date Taking? Authorizing Provider  acetaZOLAMIDE (DIAMOX) 250 MG tablet Take 1 tablet (250 mg total) by mouth 2 (two) times daily. 09/05/18   Nita Sells, MD   albuterol (PROVENTIL HFA;VENTOLIN HFA) 108 (90 Base) MCG/ACT inhaler Inhale 2 puffs into the lungs every 6 (six) hours as needed for wheezing or shortness of breath. 12/24/17   Brand Males, MD  ALPRAZolam Duanne Moron) 0.5 MG tablet Take 1 tablet (0.5 mg total) by mouth 3 (three) times daily. 10/14/18   Burns, Claudina Lick, MD  diphenhydramine-acetaminophen (TYLENOL PM) 25-500 MG TABS tablet Take 2 tablets by mouth at bedtime as needed (pain/sleep).    [provider]  doxycycline (VIBRA-TABS) 100 MG tablet Take 1 tablet (100 mg total) by mouth 2 (two) times daily. 10/19/18   Brand Males, MD  fluticasone (FLONASE) 50 MCG/ACT nasal spray Place 2 sprays into both nostrils daily. 10/19/18   Brand Males, MD  Fluticasone-Umeclidin-Vilant (TRELEGY ELLIPTA) 100-62.5-25 MCG/INH AEPB Inhale 1 puff into the lungs daily. 07/16/18   Brand Males, MD  furosemide (LASIX) 40 MG tablet Take 40 mg by mouth daily.     [provider]  GLUCOCOM LANCETS 33G MISC Use with Test Strips to take blood sugars 07/20/15   Burns, Claudina Lick, MD  Insulin Pen Needle (BD PEN NEEDLE NANO U/F) 32G X 4 MM MISC USE TO ADMINISTER LANTUS INSULIN 05/08/16   Binnie Rail, MD  LANTUS SOLOSTAR 100 UNIT/ML Solostar Pen INJECT 32 UNITS INTO THE SKIN DAILY AT 10 PM. 10/14/18   Burns, Claudina Lick, MD  nystatin (MYCOSTATIN) 100000 UNIT/ML suspension Take 5 mLs (500,000 Units total) by mouth 4 (four) times daily. Patient taking differently: Take 5 mLs by mouth See admin instructions. Swish and swallow 5 mls every morning after using Trelegy 07/29/18   Parrett, Fonnie Mu, NP  pantoprazole (PROTONIX) 40 MG tablet Take 1 tablet (40 mg total) by mouth daily. 01/26/18   Binnie Rail, MD  PARoxetine (PAXIL) 40 MG tablet TAKE 1 TABLET BY MOUTH EVERY DAY IN THE MORNING. Patient taking differently: Take 40 mg by mouth every morning. TAKE 1 TABLET BY MOUTH EVERY DAY IN THE MORNING. 08/20/18   Burns, Claudina Lick, MD  predniSONE (DELTASONE) 10 MG  tablet Take 4 tabs x 2 days, 2 tabs x 2 days, 1 tab x 2 days, 1/2 tab x 2 days then STOP 10/19/18   Brand Males, MD  predniSONE (DELTASONE) 20 MG tablet Take 1 tablet (20 mg total) by mouth daily with breakfast. 07/29/18   Parrett, Fonnie Mu, NP    Family History Family History  Problem Relation Age of Onset  . Other Father        MVA  . Esophageal cancer Mother     Social History Social History   Tobacco Use  . Smoking status: Current Every Day Smoker    Packs/day: 0.25    Years: 50.00    Pack years:  12.50    Types: Cigarettes  . Smokeless tobacco: Never Used  . Tobacco comment: 3-4 cigaretters per day 11/20/2017   Substance Use Topics  . Alcohol use: Yes    Alcohol/week: 0.0 standard drinks    Comment: occasional  . Drug use: No     Allergies   Ambien [zolpidem tartrate]   Review of Systems Review of Systems  Constitutional: Negative for chills and fever.  HENT: Negative for congestion and rhinorrhea.   Eyes: Negative for redness and visual disturbance.  Respiratory: Positive for cough and shortness of breath. Negative for wheezing.   Cardiovascular: Negative for chest pain and palpitations.  Gastrointestinal: Negative for nausea and vomiting.  Genitourinary: Negative for dysuria and urgency.  Musculoskeletal: Negative for arthralgias and myalgias.  Skin: Negative for pallor and wound.  Neurological: Negative for dizziness and headaches.     Physical Exam Updated Vital Signs BP (!) 112/58   Pulse 88   Temp 98.7 F (37.1 C) (Oral)   Resp 20   Wt 97.1 kg   SpO2 99%   BMI 37.91 kg/m   Physical Exam Vitals signs and nursing note reviewed.  Constitutional:      General: She is not in acute distress.    Appearance: She is well-developed. She is not diaphoretic.  HENT:     Head: Normocephalic and atraumatic.  Eyes:     Pupils: Pupils are equal, round, and reactive to light.  Neck:     Musculoskeletal: Normal range of motion and neck supple.   Cardiovascular:     Rate and Rhythm: Normal rate and regular rhythm.     Heart sounds: No murmur. No friction rub. No gallop.   Pulmonary:     Effort: Pulmonary effort is normal. Tachypnea present.     Breath sounds: Decreased breath sounds and wheezing present. No rhonchi or rales.     Comments: Very minimal air movement.  Faint wheezes. Abdominal:     General: There is no distension.     Palpations: Abdomen is soft.     Tenderness: There is no abdominal tenderness.  Musculoskeletal:        General: No tenderness.  Skin:    General: Skin is warm and dry.  Neurological:     Mental Status: She is alert and oriented to person, place, and time.  Psychiatric:        Behavior: Behavior normal.      ED Treatments / Results  Labs (all labs ordered are listed, but only abnormal results are displayed) Labs Reviewed  CBC WITH DIFFERENTIAL/PLATELET - Abnormal; Notable for the following components:      Result Value   WBC 10.7 (*)    RBC 5.16 (*)    Hemoglobin 10.3 (*)    MCV 79.8 (*)    MCH 20.0 (*)    MCHC 25.0 (*)    RDW 19.7 (*)    nRBC 1.9 (*)    Neutro Abs 8.0 (*)    Monocytes Absolute 1.1 (*)    Abs Immature Granulocytes 0.08 (*)    All other components within normal limits  COMPREHENSIVE METABOLIC PANEL - Abnormal; Notable for the following components:   Potassium 3.2 (*)    Glucose, Bld 209 (*)    Calcium 8.7 (*)    GFR calc non Af Amer 60 (*)    All other components within normal limits  POCT I-STAT 7, (LYTES, BLD GAS, ICA,H+H) - Abnormal; Notable for the following components:   pCO2 arterial 56.5 (*)  pO2, Arterial 63.0 (*)    Bicarbonate 35.3 (*)    TCO2 37 (*)    Acid-Base Excess 9.0 (*)    Potassium 3.1 (*)    Calcium, Ion 1.12 (*)    All other components within normal limits  SARS CORONAVIRUS 2 BY RT PCR (HOSPITAL ORDER, [place])  BRAIN NATRIURETIC PEPTIDE  BLOOD GAS, ARTERIAL  TROPONIN I (HIGH SENSITIVITY)    EKG EKG  Interpretation  Date/Time:  Monday November 02 2018 17:08:18 EDT Ventricular Rate:  111 PR Interval:    QRS Duration: 82 QT Interval:  343 QTC Calculation: 467 R Axis:   89 Text Interpretation:  Sinus tachycardia Ventricular premature complex Anteroseptal infarct, age indeterminate No significant change since last tracing Confirmed by Deno Etienne [phone]) on 11/02/2018 5:13:23 PM   Radiology Dg Chest Port 1 View  Result Date: 11/02/2018 CLINICAL DATA:  Shortness of breath EXAM: PORTABLE CHEST 1 VIEW COMPARISON:  August 28, 2018 FINDINGS: Cardiomegaly is stable. Pulmonary vascularity is within normal limits. Consolidation is noted in the left base with suspected loculated pleural effusion. There is a smaller pleural effusion on the right. Interstitium is somewhat thickened diffusely, stable. No adenopathy evident. No bone lesions. IMPRESSION: 1. New small right pleural effusion. Stable apparent loculated pleural effusion on the left. 2. Airspace opacity left base, stable. Question scarring versus pneumonia. Both entities may be present concurrently. 3.  Somewhat diffuse interstitial thickening, stable. 4. Stable cardiomegaly. Pulmonary vascularity is stable and grossly normal. Electronically Signed   By: Lowella Grip III M.D.   On: 11/02/2018 17:48    Procedures Procedures (including critical care time)  Medications Ordered in ED Medications  Ipratropium-Albuterol (COMBIVENT) respimat 2 puff (2 puffs Inhalation Given 11/02/18 1820)  levofloxacin (LEVAQUIN) IVPB 750 mg (has no administration in time range)  albuterol (VENTOLIN HFA) 108 (90 Base) MCG/ACT inhaler 8 puff (8 puffs Inhalation Given 11/02/18 1742)  magnesium sulfate IVPB 2 g 50 mL (0 g Intravenous Stopped 11/02/18 1758)  methylPREDNISolone sodium succinate (SOLU-MEDROL) 125 mg/2 mL injection 125 mg (125 mg Intravenous Given 11/02/18 1739)  ipratropium-albuterol (DUONEB) 0.5-2.5 (3) MG/3ML nebulizer solution 3 mL (3 mLs  Nebulization Given 11/02/18 1942)     Initial Impression / Assessment and Plan / ED Course  I have reviewed the triage vital signs and the nursing notes.  Pertinent labs & imaging results that were available during my care of the patient were reviewed by me and considered in my medical decision making (see chart for details).        73 yo F with a chief complaints of shortness of breath.  Most likely this is a COPD exacerbation based on history and physical exam.  Patient is severely tachypneic and short of breath.  She also unfortunately has a very poor reserve as she is chronically on 6 L of oxygen at all times.  She was titrated from nonrebreather to high flow nasal cannula however decompensated and went back on nonrebreather.  As this is occurring during the time of the novel coronavirus pandemic there is some reluctance to start her on BiPAP or give a nebulizer treatment.  Given Solu-Medrol and magnesium.  Given a puffs albuterol with some mild improvement.  Awaiting the rapid COVID test started on BiPAP. Patient's code has is negative.  She is reassessed and doing much better on BiPAP.  Will discuss with the hospitalist for admission.  CRITICAL CARE Performed by: Cecilio Asper   Total critical care time:  80 minutes  Critical care time was exclusive of separately billable procedures and treating other patients.  Critical care was necessary to treat or prevent imminent or life-threatening deterioration.  Critical care was time spent personally by me on the following activities: development of treatment plan with patient and/or surrogate as well as nursing, discussions with consultants, evaluation of patient's response to treatment, examination of patient, obtaining history from patient or surrogate, ordering and performing treatments and interventions, ordering and review of laboratory studies, ordering and review of radiographic studies, pulse oximetry and re-evaluation of  patient's condition.  The patients results and plan were reviewed and discussed.   Any x-rays performed were independently reviewed by myself.   Differential diagnosis were considered with the presenting HPI.  Medications  Ipratropium-Albuterol (COMBIVENT) respimat 2 puff (2 puffs Inhalation Given 11/02/18 1820)  levofloxacin (LEVAQUIN) IVPB 750 mg (has no administration in time range)  albuterol (VENTOLIN HFA) 108 (90 Base) MCG/ACT inhaler 8 puff (8 puffs Inhalation Given 11/02/18 1742)  magnesium sulfate IVPB 2 g 50 mL (0 g Intravenous Stopped 11/02/18 1758)  methylPREDNISolone sodium succinate (SOLU-MEDROL) 125 mg/2 mL injection 125 mg (125 mg Intravenous Given 11/02/18 1739)  ipratropium-albuterol (DUONEB) 0.5-2.5 (3) MG/3ML nebulizer solution 3 mL (3 mLs Nebulization Given 11/02/18 1942)    Vitals:   11/02/18 1859 11/02/18 1921 11/02/18 1938 11/02/18 2009  BP: (!) 92/56 99/61  (!) 112/58  Pulse: 99 98  88  Resp: (!) 21 (!) 21  20  Temp:      TempSrc:      SpO2: 93% 100% 100% 99%  Weight:        Final diagnoses:  COPD exacerbation ([place])    Admission/ observation were discussed with the admitting physician, patient and/or family and they are comfortable with the plan.    Final Clinical Impressions(s) / ED Diagnoses   Final diagnoses:  COPD exacerbation [facility])    ED Discharge Orders    None       Deno Etienne, DO 11/02/18 2012

## 2018-11-02 NOTE — Progress Notes (Signed)
Extra blanket given to patient.

## 2018-11-02 NOTE — Patient Instructions (Signed)
You were seen today by Lauraine Rinne, NP  for:   1. Chronic respiratory failure with hypoxia and hypercapnia (HCC)  2. Chronic obstructive pulmonary disease with acute exacerbation ([place])  3. Tobacco abuse   We recommend that you present to an emergency room for further evaluation We recommend that your palliative care RN [facility] contact 911 for evaluation as well as transfer to emergency room  Continue oxygen therapy as prescribed Continue prednisone as prescribed Continue Trelegy Ellipta as prescribed We emphasized the need for you to stop smoking We will coordinate short follow-up with our office in 1 week  Follow Up:    Return in about 1 week (around 11/09/2018), or if symptoms worsen or fail to improve, for Follow up with Dr. Purnell Shoemaker, Follow up with Wyn Quaker FNP-C.   Please do your part to reduce the spread of COVID-19:      Reduce your risk of any infection  and COVID19 by using the similar precautions used for avoiding the common cold or flu:  Marland Kitchen Wash your hands often with soap and warm water for at least 20 seconds.  If soap and water are not readily available, use an alcohol-based hand sanitizer with at least 60% alcohol.  . If coughing or sneezing, cover your mouth and nose by coughing or sneezing into the elbow areas of your shirt or coat, into a tissue or into your sleeve (not your hands). Langley Gauss A MASK when in public  . Avoid shaking hands with others and consider head nods or verbal greetings only. . Avoid touching your eyes, nose, or mouth with unwashed hands.  . Avoid close contact with people who are sick. . Avoid places or events with large numbers of people in one location, like concerts or sporting events. . If you have some symptoms but not all symptoms, continue to monitor at home and seek medical attention if your symptoms worsen. . If you are having a medical emergency, call 911.   [place]  / e-Visit: [email or web address]         [facility]: [facility]: [id number]                   [facility]: [id number]     It is flu season:   >>> Best ways to protect herself from the flu: Receive the yearly flu vaccine, practice good hand hygiene washing with soap and also using hand sanitizer when available, eat a nutritious meals, get adequate rest, hydrate appropriately   Please contact the office if your symptoms worsen or you have concerns that you are not improving.   Thank you for choosing [facility] for your healthcare, and for allowing [place] to partner with you on your healthcare journey. I am thankful to be able to provide care to you today.   Wyn Quaker FNP-C

## 2018-11-02 NOTE — H&P (Signed)
[name] [id number] DOB: Mar 07, 1945 DOA: 11/02/2018     PCP: Binnie Rail, MD   Outpatient Specialists:     Pulmonary  Dr. [name]   Patient arrived to ER on 11/02/18 at 1705  Patient coming from: home Lives alone,        Chief Complaint:   Chief Complaint  Patient presents with  . Shortness of Breath    HPI: [name] is a 73 y.o. female with medical history significant of COPD requiring 6 L of oxygen at all times, bronchiectasis, diastolic CHF, HLD, HTN, mediastinal mass, CT, pulmonary nodules, Tobacco abuse psoriasis, GERD, diabetes  Presented with   Dyspnea, during the night when patient was moving about she had developed worsening shortness of breath she called her pulmonologist who told her to come to emergency department she have had some mild cough no fevers no chills no chest pain no leg edema she is unaware of any sick contacts.  She have not tried anything for her illness.     Infectious risk factors:  Reports  shortness of breath, dry cough,     In  ER RAPID COVID TEST NEGATIVE   Lab Results  Component Value Date   [place] NEGATIVE 11/02/2018   [place] NOT DETECTED 09/02/2018   [place] NEGATIVE 08/26/2018    Regarding pertinent Chronic problems:     HTN on laasix   CHF diastolic - last echo [id number] ejection fraction of 60-65%.  Left ventricular diastolic Doppler parameters are consistent with impaired  relaxation On lasix      DM 2 -  Lab Results  Component Value Date   HGBA1C 9.1 (H) 08/26/2018   on insulin, PO meds     obesity-   BMI Readings from Last 1 Encounters:  11/02/18 37.91 kg/m        COPD - followed by pulmonology  on baseline oxygen  6 L,      While in ER: Arrived on NR  COVID  tested negative Hg 10  ABG CO2 56 O2 63 Until COVID returned negative BiPAP was delayed after she tested negative started on BiPAP And she improved initially was treated with Solu-Medrol magnesium  The following Work  up has been ordered so far:  Orders Placed This Encounter  Procedures  . SARS Coronavirus 2 by RT PCR (hospital order, performed in [facility] - Closed hospital lab) Nasopharyngeal Nasopharyngeal Swab  . DG Chest Port 1 View  . CBC with Differential  . Comprehensive metabolic panel  . Brain natriuretic peptide  . Blood gas, arterial  . Consult to hospitalist  ALL PATIENTS BEING ADMITTED/HAVING PROCEDURES NEED COVID-19 SCREENING  . Bipap  . I-STAT 7, (LYTES, BLD GAS, ICA, H+H)  . EKG 12-Lead    Following Medications were ordered in ER: Medications  Ipratropium-Albuterol (COMBIVENT) respimat 2 puff (2 puffs Inhalation Given 11/02/18 1820)  levofloxacin (LEVAQUIN) IVPB 750 mg (750 mg Intravenous New Bag/Given 11/02/18 2013)  albuterol (VENTOLIN HFA) 108 (90 Base) MCG/ACT inhaler 8 puff (8 puffs Inhalation Given 11/02/18 1742)  magnesium sulfate IVPB 2 g 50 mL (0 g Intravenous Stopped 11/02/18 1758)  methylPREDNISolone sodium succinate (SOLU-MEDROL) 125 mg/2 mL injection 125 mg (125 mg Intravenous Given 11/02/18 1739)  ipratropium-albuterol (DUONEB) 0.5-2.5 (3) MG/3ML nebulizer solution 3 mL (3 mLs Nebulization Given 11/02/18 1942)        Consult Orders  (From admission, onward)         Start     Ordered   11/02/18 2012  Consult  to hospitalist  ALL PATIENTS BEING ADMITTED/HAVING PROCEDURES NEED COVID-19 SCREENING  Once    Comments: ALL PATIENTS BEING ADMITTED/HAVING PROCEDURES NEED COVID-19 SCREENING  Provider:  (Not yet assigned)  Question Answer Comment  Place call to: [facility]   Reason for Consult Admit      11/02/18 2011          Significant initial  Findings: Abnormal Labs Reviewed  CBC WITH DIFFERENTIAL/PLATELET - Abnormal; Notable for the following components:      Result Value   WBC 10.7 (*)    RBC 5.16 (*)    Hemoglobin 10.3 (*)    MCV 79.8 (*)    MCH 20.0 (*)    MCHC 25.0 (*)    RDW 19.7 (*)    nRBC 1.9 (*)    Neutro Abs 8.0 (*)    Monocytes Absolute  1.1 (*)    Abs Immature Granulocytes 0.08 (*)    All other components within normal limits  COMPREHENSIVE METABOLIC PANEL - Abnormal; Notable for the following components:   Potassium 3.2 (*)    Glucose, Bld 209 (*)    Calcium 8.7 (*)    GFR calc non Af Amer 60 (*)    All other components within normal limits  POCT I-STAT 7, (LYTES, BLD GAS, ICA,H+H) - Abnormal; Notable for the following components:   pCO2 arterial 56.5 (*)    pO2, Arterial 63.0 (*)    Bicarbonate 35.3 (*)    TCO2 37 (*)    Acid-Base Excess 9.0 (*)    Potassium 3.1 (*)    Calcium, Ion 1.12 (*)    All other components within normal limits    Otherwise labs showing:    Recent Labs  Lab 11/02/18 1729 11/02/18 1831  NA 142 143  K 3.2* 3.1*  CO2 30  --   GLUCOSE 209*  --   BUN 12  --   CREATININE 0.96  --   CALCIUM 8.7*  --     Cr   stable,   Lab Results  Component Value Date   CREATININE 0.96 11/02/2018   CREATININE 1.06 (H) 09/05/2018   CREATININE 0.90 09/04/2018    Recent Labs  Lab 11/02/18 1729  AST 19  ALT 17  ALKPHOS 69  BILITOT 0.5  PROT 6.6  ALBUMIN 3.5   Lab Results  Component Value Date   CALCIUM 8.7 (L) 11/02/2018   PHOS 2.9 09/04/2018      WBC      Component Value Date/Time   WBC 10.7 (H) 11/02/2018 1729   ANC    Component Value Date/Time   NEUTROABS 8.0 (H) 11/02/2018 1729   ALC No components found for: LYMPHAB    Plt: Lab Results  Component Value Date   PLT 361 11/02/2018      COVID-19 Labs  Recent Labs    11/02/18 1740  DDIMER 0.39    Lab Results  Component Value Date   SARSCOV2NAA NEGATIVE 11/02/2018   SARSCOV2NAA NOT DETECTED 09/02/2018   SARSCOV2NAA NEGATIVE 08/26/2018     Arterial   ABG    Component Value Date/Time   PHART 7.403 11/02/2018 1831   PCO2ART 56.5 (H) 11/02/2018 1831   PO2ART 63.0 (L) 11/02/2018 1831   HCO3 35.3 (H) 11/02/2018 1831   TCO2 37 (H) 11/02/2018 1831   ACIDBASEDEF 2.0 04/04/2010 2053   O2SAT 92.0 11/02/2018  1831      HG/HCT   Down      Component Value Date/Time   HGB  12.6 11/02/2018 1831   HCT 37.0 11/02/2018 1831     Troponin 11  Cardiac Panel (last 3 results) No results for input(s): CKTOTAL, CKMB, TROPONINI, RELINDX in the last 72 hours.     ECG: Ordered Personally reviewed by me showing: HR : 111 Rhythm Sinus tachycardia W PVC     no evidence of ischemic changes QTC 467  BNP (last 3 results) Recent Labs    08/26/18 1633 11/02/18 1729  BNP 374.2* 72.3     DM  labs:  HbA1C: Recent Labs    08/26/18 2148  HGBA1C 9.1*         UA  ordered     Ordered   CXR - abnormal but stable    ED Triage Vitals  Enc Vitals Group     BP 11/02/18 1710 103/72     Pulse Rate 11/02/18 1710 95     Resp 11/02/18 1710 19     Temp 11/02/18 1710 98.7 F (37.1 C)     Temp Source 11/02/18 1710 Oral     SpO2 11/02/18 1710 97 %     Weight 11/02/18 1711 214 lb (97.1 kg)     Height --      Head Circumference --      Peak Flow --      Pain Score 11/02/18 1711 0     Pain Loc --      Pain Edu? --      Excl. in [place]? --   TMAX(24)@       Latest  Blood pressure (!) 112/58, pulse 88, temperature 98.7 F (37.1 C), temperature source Oral, resp. rate 20, weight 97.1 kg, SpO2 99 %.     Hospitalist was called for admission for COPD exacerbation    Review of Systems:    Pertinent positives include: fatigue shortness of breath at rest. dyspnea on exertion, Constitutional:  No weight loss, night sweats, Fevers, chills, , weight loss  HEENT:  No headaches, Difficulty swallowing,Tooth/dental problems,Sore throat,  No sneezing, itching, ear ache, nasal congestion, post nasal drip,  Cardio-vascular:  No chest pain, Orthopnea, PND, anasarca, dizziness, palpitations.no Bilateral lower extremity swelling  GI:  No heartburn, indigestion, abdominal pain, nausea, vomiting, diarrhea, change in bowel habits, loss of appetite, melena, blood in stool, hematemesis Resp:    No excess mucus, no  productive cough, No non-productive cough, No coughing up of blood.No change in color of mucus.No wheezing. Skin:  no rash or lesions. No jaundice GU:  no dysuria, change in color of urine, no urgency or frequency. No straining to urinate.  No flank pain.  Musculoskeletal:  No joint pain or no joint swelling. No decreased range of motion. No back pain.  Psych:  No change in mood or affect. No depression or anxiety. No memory loss.  Neuro: no localizing neurological complaints, no tingling, no weakness, no double vision, no gait abnormality, no slurred speech, no confusion  All systems reviewed and apart from [place] all are negative  Past Medical History:   Past Medical History:  Diagnosis Date  . Arrhythmia   . Bronchiectasis march 2012   On CT chest. RUL. Mild  . CHF (congestive heart failure) ([place])   . Chicken pox   . Chronic diastolic heart failure ([place])   . Chronic respiratory failure ([place])    Followed in Pulmonary clinic/ [facility]/ Ramaswamy  - 06/30/2012 desat to 86%  RA walking 50 ft, recovered to 90% at rest - 06/30/2012  Walked 1lpm x 3  laps @ 185 ft each stopped due to  Sob, no desat  rec 02 2lpm with activity and sleeping, ok at rest   . COPD (chronic obstructive pulmonary disease) ([place])     FEV-1 in 2008 was 63% with a diffusion capacity of 33%.   . Generalized anxiety disorder   . History of cervical cancer 1982  . Hyperlipidemia   . Hypertension   . Leg swelling    Venous doppler right 07/21/12 >>Neg    . 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 Medical history non-contributory   . Medical non-compliance   . MITRAL VALVE PROLAPSE   . Obesity, unspecified   . Pulmonary nodule 03/2010   1mm RUL and RLL 1st seen march 2012 CT, ?progression 12/2012 CT  . Seasonal allergies   . Tobacco abuse     Smokes one pack a day since age 43.      Past Surgical History:  Procedure Laterality Date  . TOTAL ABDOMINAL HYSTERECTOMY       Social History:  Ambulatory  Gilford Rile      reports that she has been smoking cigarettes. She has a 12.50 pack-year smoking history. She has never used smokeless tobacco. She reports current alcohol use. She reports that she does not use drugs.   Family History:   Family History  Problem Relation Age of Onset  . Other Father        MVA  . Esophageal cancer Mother     Allergies: Allergies  Allergen Reactions  . Ambien [Zolpidem Tartrate] Other (See Comments)    Causes nightmares     Prior to Admission medications   Medication Sig Start Date End Date Taking? Authorizing Provider  acetaZOLAMIDE (DIAMOX) 250 MG tablet Take 1 tablet (250 mg total) by mouth 2 (two) times daily. 09/05/18   Nita Sells, MD  albuterol (PROVENTIL HFA;VENTOLIN HFA) 108 (90 Base) MCG/ACT inhaler Inhale 2 puffs into the lungs every 6 (six) hours as needed for wheezing or shortness of breath. 12/24/17   Brand Males, MD  ALPRAZolam Duanne Moron) 0.5 MG tablet Take 1 tablet (0.5 mg total) by mouth 3 (three) times daily. 10/14/18   Burns, Claudina Lick, MD  diphenhydramine-acetaminophen (TYLENOL PM) 25-500 MG TABS tablet Take 2 tablets by mouth at bedtime as needed (pain/sleep).    [provider]  doxycycline (VIBRA-TABS) 100 MG tablet Take 1 tablet (100 mg total) by mouth 2 (two) times daily. 10/19/18   Brand Males, MD  fluticasone (FLONASE) 50 MCG/ACT nasal spray Place 2 sprays into both nostrils daily. 10/19/18   Brand Males, MD  Fluticasone-Umeclidin-Vilant (TRELEGY ELLIPTA) 100-62.5-25 MCG/INH AEPB Inhale 1 puff into the lungs daily. 07/16/18   Brand Males, MD  furosemide (LASIX) 40 MG tablet Take 40 mg by mouth daily.     [provider]  GLUCOCOM LANCETS 33G MISC Use with Test Strips to take blood sugars 07/20/15   Burns, Claudina Lick, MD  Insulin Pen Needle (BD PEN NEEDLE NANO U/F) 32G X 4 MM MISC USE TO ADMINISTER LANTUS INSULIN 05/08/16   Binnie Rail, MD  LANTUS SOLOSTAR  100 UNIT/ML Solostar Pen INJECT 32 UNITS INTO THE SKIN DAILY AT 10 PM. 10/14/18   Burns, Claudina Lick, MD  nystatin (MYCOSTATIN) 100000 UNIT/ML suspension Take 5 mLs (500,000 Units total) by mouth 4 (four) times daily. Patient taking differently: Take 5 mLs by mouth See admin instructions. Swish and swallow 5 mls every morning after using  Trelegy 07/29/18   Parrett, Fonnie Mu, NP  pantoprazole (PROTONIX) 40 MG tablet Take 1 tablet (40 mg total) by mouth daily. 01/26/18   Binnie Rail, MD  PARoxetine (PAXIL) 40 MG tablet TAKE 1 TABLET BY MOUTH EVERY DAY IN THE MORNING. Patient taking differently: Take 40 mg by mouth every morning. TAKE 1 TABLET BY MOUTH EVERY DAY IN THE MORNING. 08/20/18   Burns, Claudina Lick, MD  predniSONE (DELTASONE) 10 MG tablet Take 4 tabs x 2 days, 2 tabs x 2 days, 1 tab x 2 days, 1/2 tab x 2 days then STOP 10/19/18   Brand Males, MD  predniSONE (DELTASONE) 20 MG tablet Take 1 tablet (20 mg total) by mouth daily with breakfast. 07/29/18   Parrett, Fonnie Mu, NP   Physical Exam: Blood pressure (!) 112/58, pulse 88, temperature 98.7 F (37.1 C), temperature source Oral, resp. rate 20, weight 97.1 kg, SpO2 99 %. 1. General:  in No  Acute distress resting on biPAP  Chronically ill  -appearing 2. Psychological: Alert and  Oriented 3. Head/ENT:    Dry Mucous Membranes                          Head Non traumatic, neck supple                           Poor Dentition 4. SKIN:  decreased Skin turgor,  Skin clean Dry and intact no rash 5. Heart: Regular rate and rhythm no  Murmur, no Rub or gallop 6. Lungs:  On BiPAP no wheezes or crackles   7. Abdomen: Soft,  non-tender, Non distended  Obese  8. Lower extremities: no clubbing, cyanosis, no edema 9. Neurologically Grossly intact, moving all 4 extremities equally  10. MSK: Normal range of motion   All other LABS:     Recent Labs  Lab 11/02/18 1729 11/02/18 1831  WBC 10.7*  --   NEUTROABS 8.0*  --   HGB 10.3* 12.6  HCT 41.2 37.0  MCV  79.8*  --   PLT 361  --      Recent Labs  Lab 11/02/18 1729 11/02/18 1831  NA 142 143  K 3.2* 3.1*  CL 99  --   CO2 30  --   GLUCOSE 209*  --   BUN 12  --   CREATININE 0.96  --   CALCIUM 8.7*  --      Recent Labs  Lab 11/02/18 1729  AST 19  ALT 17  ALKPHOS 69  BILITOT 0.5  PROT 6.6  ALBUMIN 3.5       Cultures:    Component Value Date/Time   SDES BLOOD RIGHT HAND 02/22/2016 0535   SPECREQUEST IN PEDIATRIC BOTTLE 1ML 02/22/2016 0535   CULT NO GROWTH 5 DAYS 02/22/2016 0535   REPTSTATUS 02/27/2016 FINAL 02/22/2016 0535     Radiological Exams on Admission: Dg Chest Port 1 View  Result Date: 11/02/2018 CLINICAL DATA:  Shortness of breath EXAM: PORTABLE CHEST 1 VIEW COMPARISON:  August 28, 2018 FINDINGS: Cardiomegaly is stable. Pulmonary vascularity is within normal limits. Consolidation is noted in the left base with suspected loculated pleural effusion. There is a smaller pleural effusion on the right. Interstitium is somewhat thickened diffusely, stable. No adenopathy evident. No bone lesions. IMPRESSION: 1. New small right pleural effusion. Stable apparent loculated pleural effusion on the left. 2. Airspace opacity left base, stable. Question scarring versus pneumonia.  Both entities may be present concurrently. 3.  Somewhat diffuse interstitial thickening, stable. 4. Stable cardiomegaly. Pulmonary vascularity is stable and grossly normal. Electronically Signed   By: Lowella Grip III M.D.   On: 11/02/2018 17:48    Chart has been reviewed    Assessment/Plan   73 y.o. female with medical history significant of COPD requiring 6 L of oxygen at all times, bronchiectasis, diastolic CHF, HLD, HTN, mediastinal mass, CT, pulmonary nodules, Tobacco abuse psoriasis, GERD, diabetes Admitted for COPD exacerbation   Present on Admission: . COPD exacerbation ([place]) -  Will initiate: Steroid taper  -  Antibiotics azythromicin - Albuterol  PRN, - scheduled duoneb,  -  Breo  or Dulera at discharge   -  Mucinex.  Titrate O2 to saturation >90%. Follow patients respiratory status.  Ordered respiratory  PCR   ABG ordered -   PCCM consulted for e-link monitoring,  -  BiPAP ordered PRN for increased work of breathing.  Currently mentating well no evidence of symptomatic hypercarbia   . Acute on chronic respiratory failure with hypoxia (HCC) likely secondary to COPD exacerbation COVID negative  . Obesity, unspecified -nutritional consult and follow-up as an outpatient  . Hyperlipidemia -chronic currently stable  . GERD (gastroesophageal reflux disease) -chronic currently stable  . Physical deconditioning -PT OT evaluation prior to discharge  . Pulmonary hypertension ([place]) -continue home oxygen     . Hypokalemia - - will replace and repeat in AM,  check magnesium level and replace as needed   DM2 -  - Order Sensitive  SSI   - continue home insulin regimen     Lantus  32 units,  -  check TSH and HgA1C  - Hold by mouth medications   History of diastolic CHF -continue Lasix and monitor fluid status   Other plan as per orders.  DVT prophylaxis:   Lovenox     Code Status:   DNI ok to do CPR as per patient   I had personally discussed CODE STATUS with patient    Family Communication:   Family not at  Bedside    Disposition Plan:       To home once workup is complete and patient is stable                      Would benefit from PT/OT eval prior to DC  Ordered                                      Nutrition    consulted                                      Consults called:    PCCM  Admission status:  ED Disposition    None          inpatient     Expect 2 midnight stay secondary to severity of patient's current illness including   hemodynamic instability despite optimal treatment ( hypoxia, hypercapnia)   Severe lab/radiological/exam abnormalities including:  Hypoxia hypercapnea   and extensive comorbidities including:  DM2   CHF   COPD/asthma  Morbid Obesity     That are currently affecting medical management.   I expect  patient to be hospitalized for 2 midnights requiring inpatient medical care.  Patient is at high  risk for adverse outcome (such as loss of life or disability) if not treated.  Indication for inpatient stay as follows:    Hemodynamic instability despite maximal medical therapy,    New or worsening hypoxia  Need for IV antibiotics     Level of care       SDU tele indefinitely please discontinue once patient no longer qualifies  Precautions:   Droplet  No active isolations  PPE: Used by the provider:   P100  eye Goggles,  Gloves     Cristian Davitt 11/02/2018, 10:33 PM    [facility]     after 2 AM please page floor coverage PA If 7AM-7PM, please contact the day team taking care of the patient using [email or web address]

## 2018-11-02 NOTE — ED Notes (Signed)
Lab celled, can not add A1c to blood in main lab.

## 2018-11-02 NOTE — Telephone Encounter (Signed)
Called and spoke to patient. Patient currently has home health nurse with her. Patient is reporting increased shortness of breath and says this has gotten worse since decrease of prednisone.  Patient reports non-productive cough and chest congestion, denies fever. Patient is a Dr. [name] patient. Patient states her oxygen level is 75% on 6L of oxygen with a heart rate of 115 per nurse at patient's home. Scheduled patient for a telephone visit while home health RN is with her.  RN thinks patient needs emergency care, patient reports she doesn't think she needs that. NP will be calling the patient for telephone visit.

## 2018-11-02 NOTE — Progress Notes (Signed)
Virtual Visit via Telephone Note  I connected with Nalleli Cuttino on 11/02/18 at  3:30 PM EDT by telephone and verified that I am speaking with the correct person using two identifiers.  Location: Patient: Home Provider: [facility] - [id number], [place], [place] [address]   I discussed the limitations, risks, security and privacy concerns of performing an evaluation and management service by telephone and the availability of in person appointments. I also discussed with the patient that there may be a patient responsible charge related to this service. The patient expressed understanding and agreed to proceed.  Patient consented to consult via telephone: Yes People present and their role in pt care: Pt     History of Present Illness:  73 year old female current every day smoker followed in our office for COPD and chronic respiratory failure  PMH: Hyperlipidemia, anxiety, diabetes, fatigue, gerd, pulmonary htn,  Smoking Assessment: Current everyday smoker Maintenance: Trelegy Ellipta Pt of Dr. Purnell Shoemaker   Chief complaint: Acute concerns from home health evaluation  73 year old female current every day smoker contacted our office on 11/02/2018 reporting acute concerns of worsening shortness of breath as well as hypoxia despite 6 L of O2.  Patient is accompanied by home health nurse who is recommending emergency room evaluation.  Patient disagreed and would like to have a tele-visit with our office to further evaluate.  That is why the tele-visit was scheduled today.  Patient has a palliative care RN Malachy Mood who is present and doing an exam on the patient.  Monisha RN contacted our office out of concern to the patient.  Patient was recently treated telephonically for an acute exacerbation of her COPD by Dr. [name] on 10/19/2018 with doxycycline and prednisone.  Patient contacted our office today because symptoms have again worsened.  It also looks like in chart  review on 10/27/2018 patient also had episodes of oxygen levels drop into the 70s.  Patient is still not interested in proceeding forward with hospice.  Patient continues to smoke.  Patient is interested in receiving more prednisone to see if this will help with her breathing.  Palliative care RN reports that she has diminished air movement throughout her lungs.  She has no lower extremity swelling.  No audible crackles on exam today.    During the telephone exam today patient's oxygen levels were satting at 75 percent on 6 L of O2.  Heart rate was ranging between 8917 based off of blood pressure cuff and pulse oximeter monitor.  Blood pressure stable.  Observations/Objective:  11/02/2018 - SP02 - 75-81 percent 11/02/2018 - HR - 89 - 117 - with pulse oximeter 11/02/2018 - 118/73  08/27/2018-echocardiogram- LV ejection fraction 60 to 65%, increased left ventricular wall thickness, impaired relaxation, RV has normal systolic function, cavity was normal, mild diastolic dysfunction  [id number] function test- FVC 1.46 (64% predicted), FEV1 0.69 (39% predicted), ratio 47  10/30/2016-CT chest with contrast- stable appearance of mediastinal adenopathy, bilateral hilar adenopathy is identified, small nonspecific pulmonary nodules are unchanged from previous exam, emphysema  Assessment and Plan:  Tobacco abuse Plan: We recommend that you stop smoking  COPD (chronic obstructive pulmonary disease) (HCC) Concern for acute on chronic respiratory failure or COPD exacerbation Difficult to fully assess on telephonic exam Persistent hypoxia despite 6 L of O2  Plan: Recommended to the patient to contact 911 for emergency room evaluation Recommended to palliative care RN to contact 911  We will coordinate short term follow-up with our office to  check on patient  Chronic respiratory failure [facility]) Plan: Contact 911 for emergency room evaluation  Continue oxygen therapy at 6 L as  prescribed Patient will need close follow-up with our office for further evaluation   Follow Up Instructions:  Return in about 1 week (around 11/09/2018), or if symptoms worsen or fail to improve, for Follow up with Dr. Purnell Shoemaker, Follow up with Wyn Quaker FNP-C.   I discussed the assessment and treatment plan with the patient. The patient was provided an opportunity to ask questions and all were answered. The patient agreed with the plan and demonstrated an understanding of the instructions.   The patient was advised to call back or seek an in-person evaluation if the symptoms worsen or if the condition fails to improve as anticipated.  I provided 24 minutes of non-face-to-face time during this encounter.   Lauraine Rinne, NP

## 2018-11-02 NOTE — Assessment & Plan Note (Signed)
Plan: We recommend that you stop smoking 

## 2018-11-02 NOTE — Progress Notes (Signed)
COMMUNITY PALLIATIVE CARE RN NOTE  PATIENT NAME: [name] DOB: 1945/06/02 MRN: [id number]  PRIMARY CARE PROVIDER: Binnie Rail, MD  RESPONSIBLE PARTY:  Acct ID - Guarantor Home Phone Work Phone Relationship Acct Type  [id number] [facility] [phone]  Self P/F     [address], [address], [place], [place] [address]   Covid-19 Pre-screening Negative  PLAN OF CARE and INTERVENTION:  1. ADVANCE CARE PLANNING/GOALS OF CARE: Goal is for patient to remain in her home for as long as possible. 2. PATIENT/CAREGIVER EDUCATION: Dyspnea Management 3. DISEASE STATUS: Received a call from patient today with c/o difficulty breathing. RN visit made. Upon arrival, patient is sitting up on her couch awake and alert. She states that her breathing has calmed some. She has taken her routine medications, PRN Xanax and had a nebulizer treatment prior to my visit. As the visit progressed, patient's breathing began to worsen again. Her oxygen level ranged from 75-83% on 6L via [place]. HR ranging from 89-117. She contacted her Pulmonology office while I was present. I recommended that she be evaluated in the ED since we were unable to get her oxygen levels up. She requested a telephonic visit. Patient had a visit with Wyn Quaker, FNP with [facility] and after I gave him report, he also recommended that she be evaluated in the ED. She recently completed a course of antibiotics and Prednisone taper about a week ago and breathing initially improved, but started to worsen again. After telephonic visit, patient did another nebulizer treatment and her sats did increase to 89-90%, however they immediately began to drop again after completion. Initially, she was adamant about not wanting to go to the hospital, but I was finally able to convince her to allow me to call EMS. When EMS arrived, her sats were 67% and they placed a non-rebreather mask on her and increased her oxygen and her sats came up to 90%. They assisted her  to the stretcher outside and her dyspnea worsened for over 10 minutes d/t exertion. Will continue to monitor.   HISTORY OF PRESENT ILLNESS:  [name] who resides at home. Palliative care team continues to follow patient. Will continue to visit patient routinely and PRN.  CODE STATUS: Full Code ADVANCED DIRECTIVES: N MOST FORM: yes PPS: 50%   PHYSICAL EXAM:   VITALS: Today's Vitals   11/02/18 1500  BP: 118/73  Pulse: (!) 117  Resp: (!) 26  Temp: (!) 97.3 F (36.3 C)  TempSrc: Temporal  SpO2: (!) 75%  PainSc: 0-No pain    LUNGS: decreased breath sounds CARDIAC: Cor Tachy EXTREMITIES: No edema SKIN: Exposed skin is dry and intact  NEURO: Alert and oriented x 3, anxious, generalized weakness, ambulatory with walker   (Duration of visit and documentation 135 minutes)    Daryl Eastern, RN BSN

## 2018-11-02 NOTE — ED Notes (Signed)
Added A1c to blood in main lab

## 2018-11-02 NOTE — Assessment & Plan Note (Signed)
Concern for acute on chronic respiratory failure or COPD exacerbation Difficult to fully assess on telephonic exam Persistent hypoxia despite 6 L of O2  Plan: Recommended to the patient to contact 911 for emergency room evaluation Recommended to palliative care RN to contact 911  We will coordinate short term follow-up with our office to check on patient

## 2018-11-03 ENCOUNTER — Telehealth: Payer: Self-pay | Admitting: Pulmonary Disease

## 2018-11-03 DIAGNOSIS — J9601 Acute respiratory failure with hypoxia: Secondary | ICD-10-CM

## 2018-11-03 LAB — CBC
HCT: 36.8 % (ref 36.0–46.0)
Hemoglobin: 9.6 g/dL — ABNORMAL LOW (ref 12.0–15.0)
MCH: 20.4 pg — ABNORMAL LOW (ref 26.0–34.0)
MCHC: 26.1 g/dL — ABNORMAL LOW (ref 30.0–36.0)
MCV: 78.3 fL — ABNORMAL LOW (ref 80.0–100.0)
Platelets: 382 10*3/uL (ref 150–400)
RBC: 4.7 MIL/uL (ref 3.87–5.11)
RDW: 19.2 % — ABNORMAL HIGH (ref 11.5–15.5)
WBC: 12.4 10*3/uL — ABNORMAL HIGH (ref 4.0–10.5)
nRBC: 0.8 % — ABNORMAL HIGH (ref 0.0–0.2)

## 2018-11-03 LAB — COMPREHENSIVE METABOLIC PANEL
ALT: 16 U/L (ref 0–44)
AST: 16 U/L (ref 15–41)
Albumin: 3.2 g/dL — ABNORMAL LOW (ref 3.5–5.0)
Alkaline Phosphatase: 63 U/L (ref 38–126)
Anion gap: 12 (ref 5–15)
BUN: 13 mg/dL (ref 8–23)
CO2: 28 mmol/L (ref 22–32)
Calcium: 8.6 mg/dL — ABNORMAL LOW (ref 8.9–10.3)
Chloride: 101 mmol/L (ref 98–111)
Creatinine, Ser: 0.74 mg/dL (ref 0.44–1.00)
GFR calc Af Amer: >60 mL/min (ref >60)
GFR calc non Af Amer: >60 mL/min (ref >60)
Glucose, Bld: 260 mg/dL — ABNORMAL HIGH (ref 70–99)
Potassium: 4.2 mmol/L (ref 3.5–5.1)
Sodium: 141 mmol/L (ref 135–145)
Total Bilirubin: 0.3 mg/dL (ref 0.3–1.2)
Total Protein: 6.1 g/dL — ABNORMAL LOW (ref 6.5–8.1)

## 2018-11-03 LAB — CBG MONITORING, ED
Glucose-Capillary: 249 mg/dL — ABNORMAL HIGH (ref 70–99)
Glucose-Capillary: 218 mg/dL — ABNORMAL HIGH (ref 70–99)
Glucose-Capillary: 204 mg/dL — ABNORMAL HIGH (ref 70–99)

## 2018-11-03 LAB — MAGNESIUM: Magnesium: 2.2 mg/dL (ref 1.7–2.4)

## 2018-11-03 LAB — GLUCOSE, CAPILLARY: Glucose-Capillary: 148 mg/dL — ABNORMAL HIGH (ref 70–99)

## 2018-11-03 LAB — PHOSPHORUS: Phosphorus: 2.2 mg/dL — ABNORMAL LOW (ref 2.5–4.6)

## 2018-11-03 LAB — TSH: TSH: 0.559 u[IU]/mL (ref 0.350–4.500)

## 2018-11-03 MED ORDER — ALPRAZOLAM 0.5 MG PO TABS
1.0000 mg | ORAL_TABLET | Freq: Three times a day (TID) | ORAL | Status: DC
  Administered 2018-11-03: 1 mg via ORAL
  Filled 2018-11-03 (×2): qty 2

## 2018-11-03 MED ORDER — INFLUENZA VAC A&B SA ADJ QUAD 0.5 ML IM PRSY
0.5000 mL | PREFILLED_SYRINGE | INTRAMUSCULAR | Status: AC
  Administered 2018-11-05: 0.5 mL via INTRAMUSCULAR
  Filled 2018-11-03: qty 0.5

## 2018-11-03 MED ORDER — BUDESONIDE 0.5 MG/2ML IN SUSP
0.5000 mg | Freq: Two times a day (BID) | RESPIRATORY_TRACT | Status: DC
  Administered 2018-11-03 – 2018-11-06 (×7): 0.5 mg via RESPIRATORY_TRACT
  Filled 2018-11-03 (×10): qty 2

## 2018-11-03 MED ORDER — INSULIN ASPART 100 UNIT/ML ~~LOC~~ SOLN
0.0000 [IU] | Freq: Three times a day (TID) | SUBCUTANEOUS | Status: DC
  Administered 2018-11-03 (×2): 3 [IU] via SUBCUTANEOUS
  Administered 2018-11-04 (×2): 1 [IU] via SUBCUTANEOUS
  Administered 2018-11-04: 5 [IU] via SUBCUTANEOUS
  Administered 2018-11-05: 3 [IU] via SUBCUTANEOUS
  Administered 2018-11-05: 5 [IU] via SUBCUTANEOUS
  Administered 2018-11-05: 1 [IU] via SUBCUTANEOUS
  Administered 2018-11-06: 2 [IU] via SUBCUTANEOUS
  Administered 2018-11-06: 5 [IU] via SUBCUTANEOUS

## 2018-11-03 MED ORDER — ARFORMOTEROL TARTRATE 15 MCG/2ML IN NEBU
15.0000 ug | INHALATION_SOLUTION | Freq: Two times a day (BID) | RESPIRATORY_TRACT | Status: DC
  Administered 2018-11-03 – 2018-11-06 (×7): 15 ug via RESPIRATORY_TRACT
  Filled 2018-11-03 (×9): qty 2

## 2018-11-03 NOTE — ED Notes (Signed)
Pt repositioned in bed, tolerating bipap and appears comfortable, denies complaints at this time. Pt lying on side which she prefers for comfort

## 2018-11-03 NOTE — ED Notes (Signed)
RN from [facility] to call back to take report

## 2018-11-03 NOTE — ED Notes (Signed)
Pt anxious, pt wet. Changed pt and placed back on purewick.  Sheets changed.  Pt placed back on bipap by RT due to sob.

## 2018-11-03 NOTE — Progress Notes (Signed)
Notified pharmacy to send pulmicort.

## 2018-11-03 NOTE — ED Notes (Signed)
Call to to [facility].  RN is going to call me back

## 2018-11-03 NOTE — ED Notes (Signed)
Pt removed BiPap due to discomfort. [place] applied @ 6L; SpO2 86%. Nonrebreather applied, SpO2 currently 98%. RT notified, coming to reassess.

## 2018-11-03 NOTE — ED Notes (Signed)
SDU ordered bfast 

## 2018-11-03 NOTE — Progress Notes (Signed)
Pt is quite SOB and medically very sick per nsg, asked PT to wait to see her.  Pt is being admitted, will re-attempt at another time.  Mee Hives, PT MS Acute Rehab Dept. Number: [place] [facility] [place]

## 2018-11-03 NOTE — Progress Notes (Signed)
[name]  Pt is a part of our community based palliative care program.  Her pulmonologist specialist suggesting that hospice be considered at home.    [facility] will follow while hospitalized and assist with hospice planning, should the patient consent.  Thank you, Venia Carbon RN, BSN, [facility]iaison (in [place]) [phone]

## 2018-11-03 NOTE — ED Notes (Signed)
BiPAP uncomfortable for pt. Attempted to readjust. RT notified, coming to adjust.

## 2018-11-03 NOTE — Telephone Encounter (Signed)
11/03/2018 0850  Checked on patient to see if she did present to the emergency room.  Thankful that she did.  She was treated for COPD exacerbation and admitted to the hospital.  Our team was consulted.  I did follow-up with Margaretmary Eddy RN ([facility]) patient's palliative care services I believe patient may benefit from having a conversation of transitioning to hospice as patient does favor being treated at home as well as with the patient's severity of disease as well as recurrent exacerbations and her continued tobacco abuse.  Margaretmary Eddy reports that they will follow-up with the patient while in the hospital and begin to have these conversations to see if the patient is willing to transition to hospice as we both agree that this would probably be better suited for patient's needs as well as improving overall quality life.  Wyn Quaker, FNP

## 2018-11-03 NOTE — ED Notes (Signed)
Full linen change after urinary incontinence. Purewick applied.

## 2018-11-03 NOTE — Progress Notes (Signed)
PROGRESS NOTE    [name]  [id number] DOB: 20-May-1945 DOA: 11/02/2018 PCP: Binnie Rail, MD     Brief Narrative:  [name] is a 73 year old female with past medical history significant for COPD with chronic respiratory failure on 6 L of oxygen at home, bronchiectasis, chronic diastolic heart failure, hyperlipidemia, hypertension, history of mediastinal mass, tobacco abuse, psoriasis, GERD and diabetes.  She presents with shortness of breath.  She admits to some mild cough but no fevers or chills, no chest pain.  The emergency department, patient was determined to have COPD exacerbation and was started on breathing treatments and Solu-Medrol.  PCCM was consulted.  Patient did require BiPAP shortly after admission.  New events last 24 hours / Subjective: Patient on a nonrebreather this morning, states that her breathing has improved with treatment so far.  Denies any chest pain.  Admits to some mild cough.  Assessment & Plan:   Active Problems:   Hyperlipidemia   Bronchiectasis (HCC)   Obesity, unspecified   Diabetes ([place])   GERD (gastroesophageal reflux disease)   Physical deconditioning   Pulmonary hypertension (HCC)   COPD with acute exacerbation (HCC)   Acute on chronic respiratory failure with hypoxia (HCC)   COPD exacerbation (HCC)   Hypokalemia   COPD exacerbation  -COVID-19 negative -Currently on prednisone, Brovana/Pulmicort, ipratropium, azithromycin  Acute on chronic hypoxemic respiratory failure and chronic hypercarbia -Has required BiPAP overnight, now on nonrebreather this morning -PCCM following  Mediastinal LAD and pulmonary nodules -Needs outpatient follow-up  Diabetes mellitus type 2, uncontrolled with hyperglycemia -Hemoglobin A1c 9.4 -Lantus and sliding-scale insulin  Chronic diastolic heart failure -Continue home Lasix  GERD -Continue Protonix  Depression/anxiety -Continue Paxil, Xanax  Morbid obesity -Estimated body mass index  is 37.91 kg/m as calculated from the following:   Height as of 09/05/18: 5\' 3"  (1.6 m).   Weight as of this encounter: 97.1 kg.    DVT prophylaxis: Lovenox Code Status: DO NOT INTUBATE Family Communication: None Disposition Plan: Pending clinical improvement   Consultants:   PCCM  Procedures:   None  Antimicrobials:  Anti-infectives (From admission, onward)   Start     Dose/Rate Route Frequency Ordered Stop   11/04/18 2200  azithromycin (ZITHROMAX) tablet 500 mg     500 mg Oral Daily 11/02/18 2158 11/08/18 0959   11/03/18 2200  azithromycin (ZITHROMAX) 500 mg in sodium chloride 0.9 % 250 mL IVPB     500 mg 250 mL/hr over 60 Minutes Intravenous Every 24 hours 11/02/18 2158 11/04/18 2159   11/02/18 1900  levofloxacin (LEVAQUIN) IVPB 750 mg     750 mg 100 mL/hr over 90 Minutes Intravenous  Once 11/02/18 1854 11/02/18 2226        Objective: Vitals:   11/03/18 0640 11/03/18 0652 11/03/18 0730 11/03/18 0851  BP: 100/74  108/66   Pulse: 90 93 89 92  Resp:  (!) 24  18  Temp:      TempSrc:      SpO2: 96% 99% 100% 96%  Weight:        Intake/Output Summary (Last 24 hours) at 11/03/2018 1143 Last data filed at 11/03/2018 [id number] Gross per 24 hour  Intake 2293 ml  Output -  Net 2293 ml   Filed Weights   11/02/18 1711  Weight: 97.1 kg    Examination:  General exam: Appears calm and comfortable  Respiratory system: Decreased breath sounds diffusely, respiratory effort is normal although patient does have some conversational dyspnea.  On nonrebreather  satting in the high 90s Cardiovascular system: S1 & S2 heard, RRR. No murmurs. No pedal edema. Gastrointestinal system: Abdomen is nondistended, soft and nontender. Normal bowel sounds heard. Central nervous system: Alert and oriented. No focal neurological deficits. Speech clear.  Extremities: Symmetric in appearance  Skin: No rashes, lesions or ulcers on exposed skin  Psychiatry: Judgement and insight appear normal.  Mood & affect appropriate.   Data Reviewed: I have personally reviewed following labs and imaging studies  CBC: Recent Labs  Lab 11/02/18 1729 11/02/18 1831 11/03/18 0326  WBC 10.7*  --  12.4*  NEUTROABS 8.0*  --   --   HGB 10.3* 12.6 9.6*  HCT 41.2 37.0 36.8  MCV 79.8*  --  78.3*  PLT 361  --  99991111   Basic Metabolic Panel: Recent Labs  Lab 11/02/18 1729 11/02/18 1831 11/03/18 0326  NA 142 143 141  K 3.2* 3.1* 4.2  CL 99  --  101  CO2 30  --  28  GLUCOSE 209*  --  260*  BUN 12  --  13  CREATININE 0.96  --  0.74  CALCIUM 8.7*  --  8.6*  MG  --   --  2.2  PHOS  --   --  2.2*   GFR: Estimated Creatinine Clearance: 71.6 mL/min (by C-G formula based on SCr of 0.74 mg/dL). Liver Function Tests: Recent Labs  Lab 11/02/18 1729 11/03/18 0326  AST 19 16  ALT 17 16  ALKPHOS 69 63  BILITOT 0.5 0.3  PROT 6.6 6.1*  ALBUMIN 3.5 3.2*   No results for input(s): LIPASE, AMYLASE in the last 168 hours. No results for input(s): AMMONIA in the last 168 hours. Coagulation Profile: No results for input(s): INR, PROTIME in the last 168 hours. Cardiac Enzymes: No results for input(s): CKTOTAL, CKMB, CKMBINDEX, TROPONINI in the last 168 hours. BNP (last 3 results) No results for input(s): PROBNP in the last 8760 hours. HbA1C: Recent Labs    11/02/18 2235  HGBA1C 9.4*   CBG: Recent Labs  Lab 11/02/18 2213 11/03/18 0405 11/03/18 0806  GLUCAP 256* 249* 218*   Lipid Profile: No results for input(s): CHOL, HDL, LDLCALC, TRIG, CHOLHDL, LDLDIRECT in the last 72 hours. Thyroid Function Tests: Recent Labs    11/03/18 0326  TSH 0.559   Anemia Panel: No results for input(s): VITAMINB12, FOLATE, FERRITIN, TIBC, IRON, RETICCTPCT in the last 72 hours. Sepsis Labs: No results for input(s): PROCALCITON, LATICACIDVEN in the last 168 hours.  Recent Results (from the past 240 hour(s))  SARS Coronavirus 2 by RT PCR (hospital order, performed in [facility] hospital lab)  Nasopharyngeal Nasopharyngeal Swab     Status: None   Collection Time: 11/02/18  5:53 PM   Specimen: Nasopharyngeal Swab  Result Value Ref Range Status   SARS Coronavirus 2 NEGATIVE NEGATIVE Final    Comment: (NOTE) If result is NEGATIVE SARS-CoV-2 target nucleic acids are NOT DETECTED. The SARS-CoV-2 RNA is generally detectable in upper and lower  respiratory specimens during the acute phase of infection. The lowest  concentration of SARS-CoV-2 viral copies this assay can detect is 250  copies / mL. A negative result does not preclude SARS-CoV-2 infection  and should not be used as the sole basis for treatment or other  patient management decisions.  A negative result may occur with  improper specimen collection / handling, submission of specimen other  than nasopharyngeal swab, presence of viral mutation(s) within the  areas targeted by this assay,  and inadequate number of viral copies  (<250 copies / mL). A negative result must be combined with clinical  observations, patient history, and epidemiological information. If result is POSITIVE SARS-CoV-2 target nucleic acids are DETECTED. The SARS-CoV-2 RNA is generally detectable in upper and lower  respiratory specimens dur ing the acute phase of infection.  Positive  results are indicative of active infection with SARS-CoV-2.  Clinical  correlation with patient history and other diagnostic information is  necessary to determine patient infection status.  Positive results do  not rule out bacterial infection or co-infection with other viruses. If result is PRESUMPTIVE POSTIVE SARS-CoV-2 nucleic acids MAY BE PRESENT.   A presumptive positive result was obtained on the submitted specimen  and confirmed on repeat testing.  While 2019 novel coronavirus  (SARS-CoV-2) nucleic acids may be present in the submitted sample  additional confirmatory testing may be necessary for epidemiological  and / or clinical management purposes  to  differentiate between  SARS-CoV-2 and other Sarbecovirus currently known to infect humans.  If clinically indicated additional testing with an alternate test  methodology [id number]) is advised. The SARS-CoV-2 RNA is generally  detectable in upper and lower respiratory sp ecimens during the acute  phase of infection. The expected result is Negative. Fact Sheet for Patients:  StrictlyIdeas.no Fact Sheet for Healthcare Providers: BankingDealers.co.za This test is not yet approved or cleared by the [place] FDA and has been authorized for detection and/or diagnosis of SARS-CoV-2 by FDA under an Emergency Use Authorization (EUA).  This EUA will remain in effect (meaning this test can be used) for the duration of the COVID-19 declaration under Section 564(b)(1) of the Act, 21 U.S.C. section 360bbb-3(b)(1), unless the authorization is terminated or revoked sooner. Performed at [facility], [place] [address], [place],  [address]       Radiology Studies: Dg Chest Port 1 View  Result Date: 11/02/2018 CLINICAL DATA:  Shortness of breath EXAM: PORTABLE CHEST 1 VIEW COMPARISON:  August 28, 2018 FINDINGS: Cardiomegaly is stable. Pulmonary vascularity is within normal limits. Consolidation is noted in the left base with suspected loculated pleural effusion. There is a smaller pleural effusion on the right. Interstitium is somewhat thickened diffusely, stable. No adenopathy evident. No bone lesions. IMPRESSION: 1. New small right pleural effusion. Stable apparent loculated pleural effusion on the left. 2. Airspace opacity left base, stable. Question scarring versus pneumonia. Both entities may be present concurrently. 3.  Somewhat diffuse interstitial thickening, stable. 4. Stable cardiomegaly. Pulmonary vascularity is stable and grossly normal. Electronically Signed   By: Lowella Grip III M.D.   On: 11/02/2018 17:48      Scheduled  Meds: . acetaZOLAMIDE  250 mg Oral BID  . ALPRAZolam  0.5 mg Oral TID  . arformoterol  15 mcg Nebulization BID  . [START ON 11/04/2018] azithromycin  500 mg Oral Daily  . budesonide (PULMICORT) nebulizer solution  0.5 mg Nebulization BID  . enoxaparin (LOVENOX) injection  40 mg Subcutaneous Q24H  . furosemide  40 mg Oral Daily  . insulin aspart  0-9 Units Subcutaneous Q4H  . insulin glargine  32 Units Subcutaneous Q2200  . Ipratropium-Albuterol  2 puff Inhalation BID  . pantoprazole  40 mg Oral Daily  . PARoxetine  40 mg Oral q morning - 10a  . [START ON 11/04/2018] predniSONE  40 mg Oral Q breakfast  . sodium chloride flush  3 mL Intravenous Q12H  . sodium chloride flush  3 mL Intravenous Q12H  Continuous Infusions: . sodium chloride    . azithromycin       LOS: 1 day      Time spent: 40 minutes   Dessa Phi, DO [facility] 11/03/2018, 11:43 AM   Available via Epic secure chat 7am-7pm After these hours, please refer to coverage provider listed on [email or web address]

## 2018-11-03 NOTE — Progress Notes (Signed)
Pulm Interim Note  Checked on patient, back on BIPAP due to anxiety. 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

## 2018-11-03 NOTE — ED Notes (Signed)
Pt continually requesting food since she hasn't eaten all day. Reports feeling lightheaded and shaky. CBG checked. Explained to patient NPO status while on bipap. Rt contacted to reposition bipap mask and trial on nasal cannula

## 2018-11-03 NOTE — Discharge Planning (Signed)
Pt is from home alone.  Pt is active with [name], [name] for NA and [facility] for RN and PT.  Has home 02 and trilogy.  Discharged from [facility] 09/25/2018.

## 2018-11-03 NOTE — ED Notes (Signed)
Pt CBG was 218, notified Anna(RN)

## 2018-11-03 NOTE — Consult Note (Signed)
NAMEBurdette [name], MRN:  [id number], DOB:  11-09-1945, LOS: 1 ADMISSION DATE:  11/02/2018, CONSULTATION DATE:  11/02/2018 REFERRING MD:  Dr. Roel Cluck, CHIEF COMPLAINT:  SOB  Brief History   73 yoF with hx of COPD, chronic hypoxic respiratory failure, patient of Dr. [name] presenting with exertional shortness of breath and hypoxia at her home O2.  Admitted by [facility] for acute COPD exacerbation.  PCCM consulted for further pulmonary recommendations  History of present illness    73 year old female with past medical history of COPD, chronic hypoxic respiratory failure on 6 L O2, ongoing tobacco abuse, diastolic heart failure,  pulmonary hypertension, mediastinal mass, pulmonary nodules, hyperlipidemia, anxiety, diabetes, fatigue, and GERD presenting with progressive exertional dyspnea since last night.  She is followed at in our office by Dr. [name] currently maintained on Trelegy Ellipta and prednisone 20 mg at baseline daily.  Previously treated on 10/19/2018 with Doxy and prednisone taper for acute COPD exacerbation, however she has had multiple exacerbations this year for same.  She is followed at home palliative care but has not been ready to proceed fully with hospice care.  Palliative care nurse found her today with increased shortness of breath and 75% on 6 L O2.  Patient insisted on telephone visit with our office who Wyn Quaker, NP recommended ER evaluation.  Denies fevers, lower extremity swelling, productive cough above baseline, chest pain, no sick contacts.    In ER, afebrile, hemodynamically stable treated with HFNC.  ABG 7.403/ 56.5/ 63 / 35.3, Labs BNP 72, WBC 10.7, neg Ddimer.  Treated with BiPAP after negative COVID, solumedrol, mag, duonebs and levaquin.  CXR shows new small right pleural effusion with possible fall loculated left effusion, as to the left base may be scarring versus pneumonia.  Admitted by [facility].  PCCM consulted for further pulmonary recommendations.   Past Medical  History  COPD, chronic hypoxic respiratory failure on 6 L O2, ongoing tobacco abuse, diastolic heart failure,  pulmonary hypertension, mediastinal mass, pulmonary nodules, hyperlipidemia, anxiety, diabetes, fatigue, and GERD  Significant Hospital Events   11/03/2018 admit  Consults:   Procedures:   Significant Diagnostic Tests:   08/27/2018-echocardiogram- LV ejection fraction 60 to 65%, increased left ventricular wall thickness, impaired relaxation, RV has normal systolic function, cavity was normal, mild diastolic dysfunction  [id number] function test- FVC 1.46 (64% predicted), FEV1 0.69 (39% predicted), ratio 47  10/30/2016-CT chest with contrast- stable appearance of mediastinal adenopathy, bilateral hilar adenopathy is identified, small nonspecific pulmonary nodules are unchanged from previous exam, emphysema  Micro Data:  10/12 SARS CoV2 >>neg 10/13 RVP >> 10/13 exp sputum cx >> 10/13 pneumocystis >>  Antimicrobials:  10/19/2018 doxy and pred  Interim history/subjective:  Patient asking when she can eat.  Does not feel that BiPAP has helped her much but not bothering her either.   Objective   Blood pressure (!) 97/54, pulse 96, temperature 98.7 F (37.1 C), temperature source Oral, resp. rate 20, weight 97.1 kg, SpO2 95 %.    Vent Mode: BIPAP;PCV FiO2 (%):  [40 %-60 %] 60 % Set Rate:  [8 bmp] 8 bmp PEEP:  [5 cmH20] 5 cmH20   Intake/Output Summary (Last 24 hours) at 11/03/2018 0051 Last data filed at 11/02/2018 2226 Gross per 24 hour  Intake 2193 ml  Output -  Net 2193 ml   Filed Weights   11/02/18 1711  Weight: 97.1 kg   Examination: General:  Elderly female lying down in bed on BiPAP in NAD HEENT: full face  mask well fitting Neuro: alert/ oriented, MAE CV: RR PULM:  Speaking full sentences on BiPAP, non labored, on 10/5 with TV >700, diminished throughout, no wheeze/ rales Extremities: warm/dry, no edema   Resolved Hospital Problem list     Assessment & Plan:   Acute on chronic hypoxic respiratory failure likely multifactorial given her underlying COPD, possible AE, possible infectious component (although symptoms do not suggest), declining functional baseline, hx diastolic HF, pulmonary hypertension, ongoing tobacco abuse, and possible progression of possible malignancy given hx of mediastinal mass and pulmonary nodules.  PET scan in 2017 showed hypermetabolic mediastinal, bi hilar, axillary adenopathy (no abd hypermetabolic activity); patient was not a candidate for invasive diagnostics given her respiratory status in 2017. - not grossly volume overloaded on exam or CXR P:  - Currently not hypercarbic and no WOB, can try to switch patient to heated HFNC for comfort and wean sats for goal 88-94% or to baseline 6L and use BIPAP prn as patient is focused on eating right now - she is a DNI but still request CPR, would likely benefit from further PMT discussions - send RVP/ and sputum for pneumocystis  - can continue abx for now, low threshold to continue as she remains afebrile and no change in cough.  Mild leukocytosis could be steroid induced.  - continue duonebs with brovanna/ pulmicort and prn albuterol - diuresis per primary  - continue with IV solumedrol.  Her baseline is 20 mg (which may need to be increased due to frequent exacerbations) - If patient does not improve, could consider CTA chest to rule out PE (Wells score is low), evaluation of pulmonary effusions (left may be loculated) and any further progression of mediastinal mass / pulmonary nodules- however patient is very high risk given for procedures and patient does not wish to pursue any further testing for malignancy workup at this time.  - will look w/bedside ultrasound to see window for diagnostic/ possibly therapeutic thoracentesis  - and ongoing tobacco cessation counseling  Remainder per primary.  PCCM will continue to follow.    Labs   CBC: Recent Labs  Lab  11/02/18 1729 11/02/18 1831  WBC 10.7*  --   NEUTROABS 8.0*  --   HGB 10.3* 12.6  HCT 41.2 37.0  MCV 79.8*  --   PLT 361  --     Basic Metabolic Panel: Recent Labs  Lab 11/02/18 1729 11/02/18 1831  NA 142 143  K 3.2* 3.1*  CL 99  --   CO2 30  --   GLUCOSE 209*  --   BUN 12  --   CREATININE 0.96  --   CALCIUM 8.7*  --    GFR: Estimated Creatinine Clearance: 59.7 mL/min (by C-G formula based on SCr of 0.96 mg/dL). Recent Labs  Lab 11/02/18 1729  WBC 10.7*    Liver Function Tests: Recent Labs  Lab 11/02/18 1729  AST 19  ALT 17  ALKPHOS 69  BILITOT 0.5  PROT 6.6  ALBUMIN 3.5   No results for input(s): LIPASE, AMYLASE in the last 168 hours. No results for input(s): AMMONIA in the last 168 hours.  ABG    Component Value Date/Time   PHART 7.403 11/02/2018 1831   PCO2ART 56.5 (H) 11/02/2018 1831   PO2ART 63.0 (L) 11/02/2018 1831   HCO3 35.3 (H) 11/02/2018 1831   TCO2 37 (H) 11/02/2018 1831   ACIDBASEDEF 2.0 04/04/2010 2053   O2SAT 92.0 11/02/2018 1831     Coagulation Profile: No results for  input(s): INR, PROTIME in the last 168 hours.  Cardiac Enzymes: No results for input(s): CKTOTAL, CKMB, CKMBINDEX, TROPONINI in the last 168 hours.  HbA1C: Hgb A1c MFr Bld  Date/Time Value Ref Range Status  11/02/2018 10:35 PM 9.4 (H) 4.8 - 5.6 % Final    Comment:    (NOTE) Pre diabetes:          5.7%-6.4% Diabetes:              >6.4% Glycemic control for   <7.0% adults with diabetes   08/26/2018 09:48 PM 9.1 (H) 4.8 - 5.6 % Final    Comment:    (NOTE) Pre diabetes:          5.7%-6.4% Diabetes:              >6.4% Glycemic control for   <7.0% adults with diabetes     CBG: Recent Labs  Lab 11/02/18 2213  GLUCAP 256*    Review of Systems:   As per HPI otherwise negative  Past Medical History  She,  has a past medical history of Arrhythmia, Bronchiectasis (march 2012), CHF (congestive heart failure) ([place]), Chicken pox, Chronic diastolic heart  failure ([place]), Chronic respiratory failure ([place]), COPD (chronic obstructive pulmonary disease) ([place]), Generalized anxiety disorder, History of cervical cancer (1982), Hyperlipidemia, Hypertension, Leg swelling, 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   Surgical History    Past Surgical History:  Procedure Laterality Date  . TOTAL ABDOMINAL HYSTERECTOMY       Social History   reports that she has been smoking cigarettes. She has a 12.50 pack-year smoking history. She has never used smokeless tobacco. She reports current alcohol use. She reports that she does not use drugs.   Family History   Her family history includes Esophageal cancer in her mother; Other in her father.   Allergies Allergies  Allergen Reactions  . Ambien [Zolpidem Tartrate] Other (See Comments)    Causes nightmares     Home Medications  Prior to Admission medications   Medication Sig Start Date End Date Taking? Authorizing Provider  albuterol (PROVENTIL HFA;VENTOLIN HFA) 108 (90 Base) MCG/ACT inhaler Inhale 2 puffs into the lungs every 6 (six) hours as needed for wheezing or shortness of breath. 12/24/17  Yes Brand Males, MD  ALPRAZolam Duanne Moron) 0.5 MG tablet Take 1 tablet (0.5 mg total) by mouth 3 (three) times daily. 10/14/18  Yes Burns, Claudina Lick, MD  diphenhydramine-acetaminophen (TYLENOL PM) 25-500 MG TABS tablet Take 2 tablets by mouth at bedtime as needed (pain/sleep).   Yes [provider]  fluticasone (FLONASE) 50 MCG/ACT nasal spray Place 2 sprays into both nostrils daily. 10/19/18  Yes Brand Males, MD  Fluticasone-Umeclidin-Vilant (TRELEGY ELLIPTA) 100-62.5-25 MCG/INH AEPB Inhale 1 puff into the lungs daily. 07/16/18  Yes Brand Males, MD  furosemide (LASIX) 40 MG tablet Take 40 mg by mouth daily.    Yes [provider]   GLUCOCOM LANCETS 33G MISC Use with Test Strips to take blood sugars 07/20/15  Yes Burns, Claudina Lick, MD  Insulin Pen Needle (BD PEN NEEDLE NANO U/F) 32G X 4 MM MISC USE TO ADMINISTER LANTUS INSULIN 05/08/16  Yes Burns, Claudina Lick, MD  LANTUS SOLOSTAR 100 UNIT/ML Solostar Pen INJECT 32 UNITS INTO THE SKIN DAILY AT 10 PM. Patient taking differently: Inject 32 Units into the skin at bedtime. DAILY AT 10 PM. 10/14/18  Yes Burns, Claudina Lick, MD  nystatin (MYCOSTATIN) 100000 UNIT/ML suspension Take 5 mLs (500,000 Units total) by mouth 4 (four) times daily. Patient taking differently: Take 5 mLs by mouth See admin instructions. Swish and swallow 5 mls every morning after using Trelegy 07/29/18  Yes Parrett, Tammy S, NP  pantoprazole (PROTONIX) 40 MG tablet Take 1 tablet (40 mg total) by mouth daily. 01/26/18  Yes Burns, Claudina Lick, MD  PARoxetine (PAXIL) 40 MG tablet TAKE 1 TABLET BY MOUTH EVERY DAY IN THE MORNING. Patient taking differently: Take 40 mg by mouth every morning. TAKE 1 TABLET BY MOUTH EVERY DAY IN THE MORNING. 08/20/18  Yes Burns, Claudina Lick, MD  predniSONE (DELTASONE) 20 MG tablet Take 1 tablet (20 mg total) by mouth daily with breakfast. 07/29/18  Yes Parrett, Tammy S, NP  acetaZOLAMIDE (DIAMOX) 250 MG tablet Take 1 tablet (250 mg total) by mouth 2 (two) times daily. Patient not taking: Reported on 11/02/2018 09/05/18   Nita Sells, MD     Kennieth Rad, MSN, AGACNP-BC [facility] Pgr: [phone] or if no answer [phone] 11/03/2018, 1:50 AM

## 2018-11-03 NOTE — ED Notes (Signed)
Pt sob.  Placed back on bipap by RT

## 2018-11-04 DIAGNOSIS — I272 Pulmonary hypertension, unspecified: Secondary | ICD-10-CM

## 2018-11-04 DIAGNOSIS — R5381 Other malaise: Secondary | ICD-10-CM

## 2018-11-04 DIAGNOSIS — E785 Hyperlipidemia, unspecified: Secondary | ICD-10-CM

## 2018-11-04 DIAGNOSIS — J9621 Acute and chronic respiratory failure with hypoxia: Secondary | ICD-10-CM

## 2018-11-04 LAB — CBC
HCT: 37.8 % (ref 36.0–46.0)
Hemoglobin: 9.7 g/dL — ABNORMAL LOW (ref 12.0–15.0)
MCH: 20.1 pg — ABNORMAL LOW (ref 26.0–34.0)
MCHC: 25.7 g/dL — ABNORMAL LOW (ref 30.0–36.0)
MCV: 78.4 fL — ABNORMAL LOW (ref 80.0–100.0)
Platelets: 422 10*3/uL — ABNORMAL HIGH (ref 150–400)
RBC: 4.82 MIL/uL (ref 3.87–5.11)
RDW: 19.4 % — ABNORMAL HIGH (ref 11.5–15.5)
WBC: 18.5 10*3/uL — ABNORMAL HIGH (ref 4.0–10.5)
nRBC: 0.7 % — ABNORMAL HIGH (ref 0.0–0.2)

## 2018-11-04 LAB — GLUCOSE, CAPILLARY
Glucose-Capillary: 138 mg/dL — ABNORMAL HIGH (ref 70–99)
Glucose-Capillary: 150 mg/dL — ABNORMAL HIGH (ref 70–99)
Glucose-Capillary: 269 mg/dL — ABNORMAL HIGH (ref 70–99)
Glucose-Capillary: 181 mg/dL — ABNORMAL HIGH (ref 70–99)

## 2018-11-04 LAB — BASIC METABOLIC PANEL
Anion gap: 10 (ref 5–15)
BUN: 17 mg/dL (ref 8–23)
CO2: 28 mmol/L (ref 22–32)
Calcium: 8.9 mg/dL (ref 8.9–10.3)
Chloride: 103 mmol/L (ref 98–111)
Creatinine, Ser: 0.93 mg/dL (ref 0.44–1.00)
GFR calc Af Amer: >60 mL/min (ref >60)
GFR calc non Af Amer: >60 mL/min (ref >60)
Glucose, Bld: 139 mg/dL — ABNORMAL HIGH (ref 70–99)
Potassium: 3.8 mmol/L (ref 3.5–5.1)
Sodium: 141 mmol/L (ref 135–145)

## 2018-11-04 LAB — MRSA PCR SCREENING: MRSA by PCR: NEGATIVE

## 2018-11-04 LAB — PROCALCITONIN: Procalcitonin: <0.1 ng/mL

## 2018-11-04 MED ORDER — ADULT MULTIVITAMIN W/MINERALS CH
1.0000 | ORAL_TABLET | Freq: Every day | ORAL | Status: DC
  Administered 2018-11-04 – 2018-11-06 (×3): 1 via ORAL
  Filled 2018-11-04 (×3): qty 1

## 2018-11-04 MED ORDER — GLUCERNA SHAKE PO LIQD
237.0000 mL | Freq: Three times a day (TID) | ORAL | Status: DC
  Administered 2018-11-04 – 2018-11-06 (×3): 237 mL via ORAL

## 2018-11-04 MED ORDER — ALPRAZOLAM 0.5 MG PO TABS
0.5000 mg | ORAL_TABLET | Freq: Three times a day (TID) | ORAL | Status: DC
  Administered 2018-11-04 – 2018-11-06 (×7): 0.5 mg via ORAL
  Filled 2018-11-04 (×6): qty 1

## 2018-11-04 MED ORDER — IPRATROPIUM-ALBUTEROL 0.5-2.5 (3) MG/3ML IN SOLN
3.0000 mL | Freq: Two times a day (BID) | RESPIRATORY_TRACT | Status: DC
  Administered 2018-11-04 – 2018-11-06 (×5): 3 mL via RESPIRATORY_TRACT
  Filled 2018-11-04 (×5): qty 3

## 2018-11-04 NOTE — Progress Notes (Signed)
NAMENadin [name], MRN:  [id number], DOB:  04-02-1945, LOS: 2 ADMISSION DATE:  11/02/2018, CONSULTATION DATE:  11/02/2018 REFERRING MD:  Dr. Roel Cluck, CHIEF COMPLAINT:  SOB  Brief History   [name] with hx of COPD, chronic hypoxic respiratory failure, patient of Dr. [name] presenting with exertional shortness of breath and hypoxia at her home O2.  Admitted by [facility] for acute COPD exacerbation.  PCCM consulted for further pulmonary recommendations  History of present illness    73 year old female with past medical history of COPD, chronic hypoxic respiratory failure on 6 L O2, ongoing tobacco abuse, diastolic heart failure,  pulmonary hypertension, mediastinal mass, pulmonary nodules, hyperlipidemia, anxiety, diabetes, fatigue, and GERD presenting with progressive exertional dyspnea since last night.  She is followed at in our office by Dr. [name] currently maintained on Trelegy Ellipta and prednisone 20 mg at baseline daily.  Previously treated on 10/19/2018 with Doxy and prednisone taper for acute COPD exacerbation, however she has had multiple exacerbations this year for same.  She is followed at home palliative care but has not been ready to proceed fully with hospice care.  Palliative care nurse found her today with increased shortness of breath and 75% on 6 L O2.  Patient insisted on telephone visit with our office who Wyn Quaker, NP recommended ER evaluation.  Denies fevers, lower extremity swelling, productive cough above baseline, chest pain, no sick contacts.    In ER, afebrile, hemodynamically stable treated with HFNC.  ABG 7.403/ 56.5/ 63 / 35.3, Labs BNP 72, WBC 10.7, neg Ddimer.  Treated with BiPAP after negative COVID, solumedrol, mag, duonebs and levaquin.  CXR shows new small right pleural effusion with possible fall loculated left effusion, as to the left base may be scarring versus pneumonia.  Admitted by [facility].  PCCM consulted for further pulmonary recommendations.   Past Medical  History  COPD, chronic hypoxic respiratory failure on 6 L O2, ongoing tobacco abuse, diastolic heart failure,  pulmonary hypertension, mediastinal mass, pulmonary nodules, hyperlipidemia, anxiety, diabetes, fatigue, and GERD  Significant Hospital Events   11/03/2018 admit  Consults:   Procedures:   Significant Diagnostic Tests:   08/27/2018-echocardiogram- LV ejection fraction 60 to 65%, increased left ventricular wall thickness, impaired relaxation, RV has normal systolic function, cavity was normal, mild diastolic dysfunction  [id number] function test- FVC 1.46 (64% predicted), FEV1 0.69 (39% predicted), ratio 47  10/30/2016-CT chest with contrast- stable appearance of mediastinal adenopathy, bilateral hilar adenopathy is identified, small nonspecific pulmonary nodules are unchanged from previous exam, emphysema  Micro Data:  10/12 SARS CoV2 >>neg 10/13 RVP >> 10/13 exp sputum cx >> 10/13 pneumocystis >>  Antimicrobials:  10/19/2018 doxy and pred  Interim history/subjective:  No events.  On BIPAP all night for comfort.  Denies pain, anxiety better controlled.   Objective   Blood pressure (!) 99/52, pulse 80, temperature 98.4 F (36.9 C), temperature source Oral, resp. rate 17, weight 96.5 kg, SpO2 95 %.    Vent Mode: PCV;BIPAP FiO2 (%):  [30 %-50 %] 30 % Set Rate:  [8 bmp] 8 bmp PEEP:  [5 cmH20] 5 cmH20   Intake/Output Summary (Last 24 hours) at 11/04/2018 0927 Last data filed at 11/03/2018 2306 Gross per 24 hour  Intake 3 ml  Output -  Net 3 ml   Filed Weights   11/02/18 1711 11/04/18 0500  Weight: 97.1 kg 96.5 kg   Examination: GEN: obese woman lying in bed on BIPAP HEENT: BIPAP in place with good seal CV: Heart  sounds difficult to auscultate, ext warm PULM: Distant, no accessory muscle use GI: Soft, hypoactive BS EXT: no edema, chronic discoloratino NEURO: moves all 4 ext to command PSYCH: RASS -1 SKIN: No rashes   Resolved Hospital Problem  list    Assessment & Plan:  # Acute on chronic hypoxemic respiratory failure secondary to AE-ILD # Anxiety/depression complicated objective assessment of breathing # Frail elderly # Nonresolving and nonprogressive mediastinal and hilar adenopathy # Deconditioning, significant # Has home palliative care but full code?  - Continue nebs, azithromycin, steroids as ordered - Check Pct - PT/OT consults - Wean off BIPAP - Drop back xanax a bit, need to find happy medium - Palliative care consult to help clarify goals of care  Erskine Emery MD PCCM

## 2018-11-04 NOTE — Progress Notes (Signed)
Initial Nutrition Assessment  DOCUMENTATION CODES:   Obesity unspecified  INTERVENTION:   -MVI with minerals daily -Glucerna Shake po TID, each supplement provides 220 kcal and 10 grams of protein  NUTRITION DIAGNOSIS:   Increased nutrient needs related to chronic illness(COPD) as evidenced by estimated needs.  GOAL:   Patient will meet greater than or equal to 90% of their needs  MONITOR:   PO intake, Supplement acceptance, Labs, Weight trends, Skin, I & O's  REASON FOR ASSESSMENT:   Consult Assessment of nutrition requirement/status  ASSESSMENT:   73yF with COPD on triple therapy, chronic hypoxic respiratory failure on 6L O2, mediastinal LAD and pulmonary nodules on whom we are asked to consult for acute on chronic hypoxemic and chronic hypercapnic respiratory failure.  Pt admitted with COPD exacerbation.  Reviewed I/O's: +3 ml x 24 hours and +2.3 L since admission  Case discussed with RN prior to visit, who reports pt is alert and oriented. Pt has tendency to desat when on O2. Pt was just taken off of Bi-pap prior to RD visit. RN confirms pending palliative care consult to discuss goals of care.  Spoke with pt at bedside, who reports good appetite (she prefers to be addressed as "[name]"). Pt consumed about 75% of breakfast ("the problem is you're not feeding me enough"). 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 Meals consist of (Breakfast: grits, eggs, toast; Lunch: meat loaf, mashed potatoes, and green beans; Dinner: meat, starch, and vegetables). Pt snacks on fresh fruit for snacks. Per pt, she has an aide who assist with food preparation and grocery shopping. She also receives meals on wheels.   Pt endorses a 15# wt loss over a year ago, which she feels is intentional due to healthier foods choices to help control DM. She complains that she is less active than usual due to progressive shortness of  breath.   RD discussed importance of small frequent meals to assist with energy conservation and increased oral intake with COPD. Pt would also benefit from an oral nutrition supplement if she continues to transition on and off bi-pap.   Labs reviewed: CBGS: 148-204 (inpatient orders for glycemic control are 0-9 units insulin aspart TID with meals and 32 units insulin glargine daily).  NUTRITION - FOCUSED PHYSICAL EXAM:    Most Recent Value  Orbital Region  No depletion  Upper Arm Region  No depletion  Thoracic and Lumbar Region  No depletion  Buccal Region  No depletion  Temple Region  No depletion  Clavicle Bone Region  No depletion  Clavicle and Acromion Bone Region  No depletion  Scapular Bone Region  No depletion  Dorsal Hand  No depletion  Patellar Region  No depletion  Anterior Thigh Region  No depletion  Posterior Calf Region  No depletion  Edema (RD Assessment)  Mild  Hair  Reviewed  Eyes  Reviewed  Mouth  Reviewed  Skin  Reviewed  Nails  Reviewed       Diet Order:   Diet Order            Diet Carb Modified Fluid consistency: Thin; Room service appropriate? Yes  Diet effective now              EDUCATION NEEDS:   Education needs have been addressed  Skin:  Skin Assessment: Reviewed RN Assessment  Last BM:  Unknown  Height:   Ht Readings from Last 1 Encounters:  09/05/18 5\' 3"  (1.6 m)    Weight:   Wt Readings from Last 1 Encounters:  11/04/18 96.5 kg    Ideal Body Weight:  52.3 kg  BMI:  Body mass index is 37.68 kg/m.  Estimated Nutritional Needs:   Kcal:  1650-1850  Protein:  80-95 grams  Fluid:  > 1.6 L    [name], RD, LDN, [place] Registered Dietitian II Certified Diabetes Care and Education Specialist Pager: [phone] After hours Pager: [phone]

## 2018-11-04 NOTE — Evaluation (Signed)
Physical Therapy Evaluation Patient Details Name: [name] MRN: [id number] DOB: 27-Dec-1945 Today's Date: 11/04/2018   History of Present Illness  [name] is a 73 y.o. female with medical history significant of COPD requiring 6 L of oxygen at all times, bronchiectasis, diastolic CHF, HLD, HTN, mediastinal mass, CT, pulmonary nodules,presenting with SOB and dyspnea  Clinical Impression  Pt functioning near baseline from mobility stand point with the exception of requiring 15Lo2 via high flow [place] compared to 6Lo2 via [place] at home. Pt amb limited by onset of SOB and SpO2 dec into low 80s on 15Lo2. Pt to benefit from HHPT to progress activity tolerance and endurance as well as independence. Acute PT to cont to follow.     Follow Up Recommendations Home health PT;Supervision - Intermittent(may progress and not need)    Equipment Recommendations  None recommended by PT    Recommendations for Other Services       Precautions / Restrictions Precautions Precautions: Fall Precaution Comments: pt on 15Lo2 via High Flow [place] Restrictions Weight Bearing Restrictions: No      Mobility  Bed Mobility Overal bed mobility: Modified Independent             General bed mobility comments: HOB elevated, no physical assist, used bed rail  Transfers Overall transfer level: Needs assistance Equipment used: Rolling walker (2 wheeled) Transfers: Sit to/from Stand Sit to Stand: Min guard         General transfer comment: no physical assist, min guard for safety due to lines  Ambulation/Gait Ambulation/Gait assistance: Min guard Gait Distance (Feet): 12 Feet(x1, 18x1 (to/from bathroom)) Assistive device: Rolling walker (2 wheeled) Gait Pattern/deviations: Step-through pattern;Decreased stride length Gait velocity: slow   General Gait Details: + SOB, SpO2 decreased to 84% on 15Lo2 via high flow O2, pt practiced purse lipped breathing, s/p 2 min SpO2 >90%  Stairs             Wheelchair Mobility    Modified Rankin (Stroke Patients Only)       Balance Overall balance assessment: Mild deficits observed, not formally tested                                           Pertinent Vitals/Pain Pain Assessment: No/denies pain    Home Living Family/patient expects to be discharged to:: Private residence Living Arrangements: Alone Available Help at Discharge: Personal care attendant(2x/wk) Type of Home: Apartment Home Access: Stairs to enter Entrance Stairs-Rails: Right Entrance Stairs-Number of Steps: 2 Home Layout: One level Home Equipment: Walker - 4 wheels;Bedside commode Additional Comments: has elderly aide 2x/wk    Prior Function Level of Independence: Independent with assistive device(s)         Comments: uses rollator, has family get groceries     Hand Dominance   Dominant Hand: Right    Extremity/Trunk Assessment   Upper Extremity Assessment Upper Extremity Assessment: Overall WFL for tasks assessed    Lower Extremity Assessment Lower Extremity Assessment: Overall WFL for tasks assessed    Cervical / Trunk Assessment Cervical / Trunk Assessment: Normal  Communication   Communication: No difficulties  Cognition Arousal/Alertness: Awake/alert Behavior During Therapy: WFL for tasks assessed/performed Overall Cognitive Status: Within Functional Limits for tasks assessed  General Comments General comments (skin integrity, edema, etc.): SpO2 dec into 80s during mobility    Exercises     Assessment/Plan    PT Assessment Patient needs continued PT services  PT Problem List         PT Treatment Interventions DME instruction;Gait training;Stair training;Functional mobility training;Therapeutic activities;Therapeutic exercise;Balance training;Neuromuscular re-education    PT Goals (Current goals can be found in the Care Plan section)  Acute Rehab PT  Goals Patient Stated Goal: home today PT Goal Formulation: With patient Time For Goal Achievement: 11/18/18 Potential to Achieve Goals: Good    Frequency Min 3X/week   Barriers to discharge Decreased caregiver support lives alone    Co-evaluation               AM-PAC PT "6 Clicks" Mobility  Outcome Measure Help needed turning from your back to your side while in a flat bed without using bedrails?: None Help needed moving from lying on your back to sitting on the side of a flat bed without using bedrails?: None Help needed moving to and from a bed to a chair (including a wheelchair)?: A Little Help needed standing up from a chair using your arms (e.g., wheelchair or bedside chair)?: A Little Help needed to walk in hospital room?: A Little Help needed climbing 3-5 steps with a railing? : A Little 6 Click Score: 20    End of Session Equipment Utilized During Treatment: Gait belt Activity Tolerance: Patient tolerated treatment well Patient left: in chair;with call bell/phone within reach;with nursing/sitter in room Nurse Communication: Mobility status PT Visit Diagnosis: Unsteadiness on feet (R26.81);Difficulty in walking, not elsewhere classified (R26.2)    Time: [id number] PT Time Calculation (min) (ACUTE ONLY): 20 min   Charges:   PT Evaluation $PT Eval Moderate Complexity: 1 Mod          Kittie Plater, PT, DPT Acute Rehabilitation Services Pager #: [phone] Office #: [phone]   Berline Lopes 11/04/2018, 2:16 PM

## 2018-11-04 NOTE — Progress Notes (Signed)
Inpatient Diabetes Program Recommendations  AACE/ADA: New Consensus Statement on Inpatient Glycemic Control (2015)  Target Ranges:  Prepandial:   less than 140 mg/dL      Peak postprandial:   less than 180 mg/dL (1-2 hours)      Critically ill patients:  140 - 180 mg/dL   Lab Results  Component Value Date   GLUCAP 138 (H) 11/04/2018   HGBA1C 9.4 (H) 11/02/2018    Review of Glycemic Control Results for [name], [name] (MRN [id number]) as of 11/04/2018 11:19  Ref. Range 11/03/2018 08:06 11/03/2018 12:35 11/03/2018 22:55 11/04/2018 07:25  Glucose-Capillary Latest Ref Range: 70 - 99 mg/dL 218 (H) 204 (H) 148 (H) 138 (H)   Diabetes history: DM 2 Outpatient Diabetes medications:  Lantus 32 units daily Current orders for Inpatient glycemic control:  Prednisone 40 mg q AM Novolog sensitive tid with meals Lantus 32 units q HS  Inpatient Diabetes Program Recommendations:    If appropriate, consider adding Novolog 3 units tid with meals (hold if patient eats less than 50%).   Thanks  Adah Perl, RN, BC-ADM Inpatient Diabetes Coordinator Pager [phone] (8a-5p)

## 2018-11-04 NOTE — Plan of Care (Signed)

## 2018-11-04 NOTE — Progress Notes (Signed)
PT Cancellation Note  Patient Details Name: [name] MRN: [id number] DOB: January 20, 1946   Cancelled Treatment:    Reason Eval/Treat Not Completed: Medical issues which prohibited therapy;Patient not medically ready. Spoke with RN, pt remains on BiPAP, unable to wean off to High Flow [place] due to drop in Spo2. PT to hold on PT eval as pt desats just sitting in bed . Acute PT to return as able, as appropriate to address mobility.  Kittie Plater, PT, DPT Acute Rehabilitation Services Pager #: [phone] Office #: [phone]    Berline Lopes 11/04/2018, 9:50 AM

## 2018-11-04 NOTE — Plan of Care (Signed)
  Problem: Education: Goal: Knowledge of General Education information will improve Description: Including pain rating scale, medication(s)/side effects and non-pharmacologic comfort measures Outcome: Progressing   Problem: Health Behavior/Discharge Planning: Goal: Ability to manage health-related needs will improve Outcome: Progressing   Problem: Clinical Measurements: Goal: Ability to maintain clinical measurements within normal limits will improve Outcome: Progressing Goal: Will remain free from infection Outcome: Progressing Goal: Respiratory complications will improve Outcome: Progressing   Problem: Activity: Goal: Risk for activity intolerance will decrease Outcome: Progressing   Problem: Nutrition: Goal: Adequate nutrition will be maintained Outcome: Progressing   Problem: Coping: Goal: Level of anxiety will decrease Outcome: Progressing   Problem: Safety: Goal: Ability to remain free from injury will improve Outcome: Progressing   Problem: Education: Goal: Ability to demonstrate management of disease process will improve Outcome: Progressing   Problem: Cardiac: Goal: Ability to achieve and maintain adequate cardiopulmonary perfusion will improve Outcome: Progressing

## 2018-11-04 NOTE — Progress Notes (Signed)
PROGRESS NOTE    [name]  [id number] DOB: 07/30/1945 DOA: 11/02/2018 PCP: Binnie Rail, MD   Brief Narrative:  HPI on 11/02/2018 by Dr. Toy Baker [name] is a 73 y.o. female with medical history significant of COPD requiring 6 L of oxygen at all times, bronchiectasis, diastolic CHF, HLD, HTN, mediastinal mass, CT, pulmonary nodules, Tobacco abuse psoriasis, GERD, diabetes  Presented with   Dyspnea, during the night when patient was moving about she had developed worsening shortness of breath she called her pulmonologist who told her to come to emergency department she have had some mild cough no fevers no chills no chest pain no leg edema she is unaware of any sick contacts.  She have not tried anything for her illness.    Interim history Patient presented with shortness of breath and was placed on BiPAP in the emergency department, along with breathing treatments, Solu-Medrol, Levaquin.  Chest x-ray showed small right pleural effusion, possible loculated left effusion.  PCCM consulted and appreciated. Assessment & Plan   COPD exacerbation/acute on chronic hypoxemic respiratory failure and hyper hypercarbia -Patient uses 6 L of oxygen at baseline -Currently requiring BiPAP -COVID-19 negative -Continue prednisone, Brovana/Pulmicort, ipratropium, azithromycin -PCCM consulted and appreciated-recommending palliative consult  Mediastinal LAD and pulmonary nodules -Patient will need outpatient follow-up  Diabetes mellitus, type II-uncontrolled -Hemoglobin A1c 9.4 -Continue Lantus and insulin sliding scale with CBG monitoring  Chronic diastolic heart failure -Currently appears to be euvolemic and compensated -Monitor intake and output, daily weights -Continue lasix  GERD -Continue PPI  Depression/anxiety -Continue Paxil, Xanax  Morbid obesity -BMI 37.9 -Patient to follow-up with primary care physician to discuss lifestyle  modifications  Leukocytosis -Suspect secondary to steroids  DVT Prophylaxis  lovenox  Code Status: DNI  Family Communication: None at bedside  Disposition Plan: Admitted  Consultants PCCM Palliative care  Procedures  None  Antibiotics   Anti-infectives (From admission, onward)   Start     Dose/Rate Route Frequency Ordered Stop   11/04/18 2200  azithromycin (ZITHROMAX) tablet 500 mg     500 mg Oral Daily 11/02/18 2158 11/08/18 0959   11/03/18 2200  azithromycin (ZITHROMAX) 500 mg in sodium chloride 0.9 % 250 mL IVPB     500 mg 250 mL/hr over 60 Minutes Intravenous Every 24 hours 11/02/18 2158 11/04/18 0039   11/02/18 1900  levofloxacin (LEVAQUIN) IVPB 750 mg     750 mg 100 mL/hr over 90 Minutes Intravenous  Once 11/02/18 1854 11/02/18 2226      Subjective:   [name] seen and examined today.  Patient currently with BiPAP in place.  States her breathing is about the same.  Has no complaints this morning.  Denies pain, chest pain, abdominal pain, nausea or vomiting, diarrhea constipation.  Objective:   Vitals:   11/04/18 0500 11/04/18 0637 11/04/18 0735 11/04/18 0802  BP:  (!) 109/59  (!) 99/52  Pulse:  86 78 80  Resp:  18 14 17   Temp:  (!) 97.5 F (36.4 C)  98.4 F (36.9 C)  TempSrc:  Oral  Oral  SpO2:  100% 99% 95%  Weight: 96.5 kg       Intake/Output Summary (Last 24 hours) at 11/04/2018 1006 Last data filed at 11/03/2018 2306 Gross per 24 hour  Intake 3 ml  Output --  Net 3 ml   Filed Weights   11/02/18 1711 11/04/18 0500  Weight: 97.1 kg 96.5 kg    Exam  General: Well developed, chronically ill-appearing, on  BiPAP  HEENT: NCAT, BIPAP in place  Cardiovascular: S1 S2 auscultated- however difficult to auscultate due to body habitus  Respiratory: Distant, diminished breath sounds, no wheezing.  Abdomen: Soft, obese, nontender, nondistended, + hypoactive bowel sounds  Extremities: warm dry without cyanosis clubbing or edema  Neuro:  AAOx3, nonfocal  Psych: Appropriate mood and affect   Data Reviewed: I have personally reviewed following labs and imaging studies  CBC: Recent Labs  Lab 11/02/18 1729 11/02/18 1831 11/03/18 0326 11/04/18 0511  WBC 10.7*  --  12.4* 18.5*  NEUTROABS 8.0*  --   --   --   HGB 10.3* 12.6 9.6* 9.7*  HCT 41.2 37.0 36.8 37.8  MCV 79.8*  --  78.3* 78.4*  PLT 361  --  382 [id number]*   Basic Metabolic Panel: Recent Labs  Lab 11/02/18 1729 11/02/18 1831 11/03/18 0326 11/04/18 0511  NA 142 143 141 141  K 3.2* 3.1* 4.2 3.8  CL 99  --  101 103  CO2 30  --  28 28  GLUCOSE 209*  --  260* 139*  BUN 12  --  13 17  CREATININE 0.96  --  0.74 0.93  CALCIUM 8.7*  --  8.6* 8.9  MG  --   --  2.2  --   PHOS  --   --  2.2*  --    GFR: Estimated Creatinine Clearance: 61.3 mL/min (by C-G formula based on SCr of 0.93 mg/dL). Liver Function Tests: Recent Labs  Lab 11/02/18 1729 11/03/18 0326  AST 19 16  ALT 17 16  ALKPHOS 69 63  BILITOT 0.5 0.3  PROT 6.6 6.1*  ALBUMIN 3.5 3.2*   No results for input(s): LIPASE, AMYLASE in the last 168 hours. No results for input(s): AMMONIA in the last 168 hours. Coagulation Profile: No results for input(s): INR, PROTIME in the last 168 hours. Cardiac Enzymes: No results for input(s): CKTOTAL, CKMB, CKMBINDEX, TROPONINI in the last 168 hours. BNP (last 3 results) No results for input(s): PROBNP in the last 8760 hours. HbA1C: Recent Labs    11/02/18 2235  HGBA1C 9.4*   CBG: Recent Labs  Lab 11/03/18 0405 11/03/18 0806 11/03/18 1235 11/03/18 2255 11/04/18 0725  GLUCAP 249* 218* 204* 148* 138*   Lipid Profile: No results for input(s): CHOL, HDL, LDLCALC, TRIG, CHOLHDL, LDLDIRECT in the last 72 hours. Thyroid Function Tests: Recent Labs    11/03/18 0326  TSH 0.559   Anemia Panel: No results for input(s): VITAMINB12, FOLATE, FERRITIN, TIBC, IRON, RETICCTPCT in the last 72 hours. Urine analysis:    Component Value Date/Time    COLORURINE YELLOW 07/04/2015 1848   APPEARANCEUR CLOUDY (A) 07/04/2015 1848   LABSPEC 1.040 (H) 07/04/2015 1848   PHURINE 6.0 07/04/2015 1848   GLUCOSEU >1000 (A) 07/04/2015 1848   GLUCOSEU NEGATIVE 04/09/2006 1315   HGBUR NEGATIVE 07/04/2015 1848   BILIRUBINUR NEGATIVE 07/04/2015 1848   KETONESUR NEGATIVE 07/04/2015 1848   PROTEINUR NEGATIVE 07/04/2015 1848   UROBILINOGEN 1.0 04/04/2010 2323   NITRITE NEGATIVE 07/04/2015 1848   LEUKOCYTESUR NEGATIVE 07/04/2015 1848   Sepsis Labs: @LABRCNTIP (procalcitonin:4,lacticidven:4)  ) Recent Results (from the past 240 hour(s))  SARS Coronavirus 2 by RT PCR (hospital order, performed in [facility] hospital lab) Nasopharyngeal Nasopharyngeal Swab     Status: None   Collection Time: 11/02/18  5:53 PM   Specimen: Nasopharyngeal Swab  Result Value Ref Range Status   SARS Coronavirus 2 NEGATIVE NEGATIVE Final    Comment: (NOTE) If result is  NEGATIVE SARS-CoV-2 target nucleic acids are NOT DETECTED. The SARS-CoV-2 RNA is generally detectable in upper and lower  respiratory specimens during the acute phase of infection. The lowest  concentration of SARS-CoV-2 viral copies this assay can detect is 250  copies / mL. A negative result does not preclude SARS-CoV-2 infection  and should not be used as the sole basis for treatment or other  patient management decisions.  A negative result may occur with  improper specimen collection / handling, submission of specimen other  than nasopharyngeal swab, presence of viral mutation(s) within the  areas targeted by this assay, and inadequate number of viral copies  (<250 copies / mL). A negative result must be combined with clinical  observations, patient history, and epidemiological information. If result is POSITIVE SARS-CoV-2 target nucleic acids are DETECTED. The SARS-CoV-2 RNA is generally detectable in upper and lower  respiratory specimens dur ing the acute phase of infection.  Positive  results  are indicative of active infection with SARS-CoV-2.  Clinical  correlation with patient history and other diagnostic information is  necessary to determine patient infection status.  Positive results do  not rule out bacterial infection or co-infection with other viruses. If result is PRESUMPTIVE POSTIVE SARS-CoV-2 nucleic acids MAY BE PRESENT.   A presumptive positive result was obtained on the submitted specimen  and confirmed on repeat testing.  While 2019 novel coronavirus  (SARS-CoV-2) nucleic acids may be present in the submitted sample  additional confirmatory testing may be necessary for epidemiological  and / or clinical management purposes  to differentiate between  SARS-CoV-2 and other Sarbecovirus currently known to infect humans.  If clinically indicated additional testing with an alternate test  methodology [phone]) is advised. The SARS-CoV-2 RNA is generally  detectable in upper and lower respiratory sp ecimens during the acute  phase of infection. The expected result is Negative. Fact Sheet for Patients:  StrictlyIdeas.no Fact Sheet for Healthcare Providers: BankingDealers.co.za This test is not yet approved or cleared by the [place] FDA and has been authorized for detection and/or diagnosis of SARS-CoV-2 by FDA under an Emergency Use Authorization (EUA).  This EUA will remain in effect (meaning this test can be used) for the duration of the COVID-19 declaration under Section 564(b)(1) of the Act, 21 U.S.C. section 360bbb-3(b)(1), unless the authorization is terminated or revoked sooner. Performed at [facility], [place] [address], [place], [place] [address]       Radiology Studies: Dg Chest Port 1 View  Result Date: 11/02/2018 CLINICAL DATA:  Shortness of breath EXAM: PORTABLE CHEST 1 VIEW COMPARISON:  August 28, 2018 FINDINGS: Cardiomegaly is stable. Pulmonary vascularity is within normal limits.  Consolidation is noted in the left base with suspected loculated pleural effusion. There is a smaller pleural effusion on the right. Interstitium is somewhat thickened diffusely, stable. No adenopathy evident. No bone lesions. IMPRESSION: 1. New small right pleural effusion. Stable apparent loculated pleural effusion on the left. 2. Airspace opacity left base, stable. Question scarring versus pneumonia. Both entities may be present concurrently. 3.  Somewhat diffuse interstitial thickening, stable. 4. Stable cardiomegaly. Pulmonary vascularity is stable and grossly normal. Electronically Signed   By: Lowella Grip III M.D.   On: 11/02/2018 17:48     Scheduled Meds:  acetaZOLAMIDE  250 mg Oral BID   ALPRAZolam  0.5 mg Oral TID   arformoterol  15 mcg Nebulization BID   azithromycin  500 mg Oral Daily   budesonide (PULMICORT) nebulizer solution  0.5 mg Nebulization BID   enoxaparin (LOVENOX) injection  40 mg Subcutaneous Q24H   furosemide  40 mg Oral Daily   influenza vaccine adjuvanted  0.5 mL Intramuscular Tomorrow-1000   insulin aspart  0-9 Units Subcutaneous TID WC   insulin glargine  32 Units Subcutaneous Q2200   ipratropium-albuterol  3 mL Nebulization BID   pantoprazole  40 mg Oral Daily   PARoxetine  40 mg Oral q morning - 10a   predniSONE  40 mg Oral Q breakfast   sodium chloride flush  3 mL Intravenous Q12H   sodium chloride flush  3 mL Intravenous Q12H   Continuous Infusions:  sodium chloride 250 mL (11/03/18 2337)     LOS: 2 days   Time Spent in minutes   45 minutes  [name] D.O. on 11/04/2018 at 10:06 AM  Between 7am to 7pm - Please see pager noted on [email or web address]  After 7pm go to [email or web address]  And look for the night coverage person covering for me after hours  [facility] Office  [phone]

## 2018-11-04 NOTE — Progress Notes (Signed)
OT Cancellation Note  Patient Details Name: [name] MRN: [id number] DOB: March 19, 1945   Cancelled Treatment:    Reason Eval/Treat Not Completed: Medical issues which prohibited therapy;Patient not medically ready, per PT speaking to RN: pt remains on BiPAP, unable to wean off to High Flow [place] due to drop in Spo2. Hold OT eval at this time as pt desats just sitting in bed . Will return as able and medically appropriate.   Delight Stare, OT Acute Rehabilitation Services Pager [phone] Office [phone]   Delight Stare 11/04/2018, 10:45 AM

## 2018-11-05 ENCOUNTER — Other Ambulatory Visit: Payer: Self-pay | Admitting: *Deleted

## 2018-11-05 ENCOUNTER — Ambulatory Visit: Payer: Self-pay | Admitting: *Deleted

## 2018-11-05 DIAGNOSIS — Z515 Encounter for palliative care: Secondary | ICD-10-CM

## 2018-11-05 DIAGNOSIS — Z7189 Other specified counseling: Secondary | ICD-10-CM

## 2018-11-05 DIAGNOSIS — J441 Chronic obstructive pulmonary disease with (acute) exacerbation: Principal | ICD-10-CM

## 2018-11-05 LAB — BASIC METABOLIC PANEL
Anion gap: 11 (ref 5–15)
BUN: 22 mg/dL (ref 8–23)
CO2: 26 mmol/L (ref 22–32)
Calcium: 8.9 mg/dL (ref 8.9–10.3)
Chloride: 104 mmol/L (ref 98–111)
Creatinine, Ser: 1.13 mg/dL — ABNORMAL HIGH (ref 0.44–1.00)
GFR calc Af Amer: 57 mL/min — ABNORMAL LOW (ref >60)
GFR calc non Af Amer: 49 mL/min — ABNORMAL LOW (ref >60)
Glucose, Bld: 135 mg/dL — ABNORMAL HIGH (ref 70–99)
Potassium: 4.1 mmol/L (ref 3.5–5.1)
Sodium: 141 mmol/L (ref 135–145)

## 2018-11-05 LAB — CBC
HCT: 36.6 % (ref 36.0–46.0)
Hemoglobin: 9.6 g/dL — ABNORMAL LOW (ref 12.0–15.0)
MCH: 20.5 pg — ABNORMAL LOW (ref 26.0–34.0)
MCHC: 26.2 g/dL — ABNORMAL LOW (ref 30.0–36.0)
MCV: 78 fL — ABNORMAL LOW (ref 80.0–100.0)
Platelets: 338 10*3/uL (ref 150–400)
RBC: 4.69 MIL/uL (ref 3.87–5.11)
RDW: 19.4 % — ABNORMAL HIGH (ref 11.5–15.5)
WBC: 12.1 10*3/uL — ABNORMAL HIGH (ref 4.0–10.5)
nRBC: 0.7 % — ABNORMAL HIGH (ref 0.0–0.2)

## 2018-11-05 LAB — GLUCOSE, CAPILLARY
Glucose-Capillary: 150 mg/dL — ABNORMAL HIGH (ref 70–99)
Glucose-Capillary: 224 mg/dL — ABNORMAL HIGH (ref 70–99)
Glucose-Capillary: 279 mg/dL — ABNORMAL HIGH (ref 70–99)
Glucose-Capillary: 220 mg/dL — ABNORMAL HIGH (ref 70–99)

## 2018-11-05 MED ORDER — ALPRAZOLAM 0.5 MG PO TABS: 0.5000 mg | ORAL_TABLET | Freq: Every evening | ORAL | Status: DC | PRN

## 2018-11-05 MED ORDER — ALPRAZOLAM 0.25 MG PO TABS: 0.2500 mg | ORAL_TABLET | Freq: Every evening | ORAL | Status: DC | PRN

## 2018-11-05 NOTE — Patient Outreach (Signed)
[facility]) Care Management  11/05/2018  [name] 1945/02/16 [id number]   Per chart review patient currently inpatient and no outreach call at this time.   Patient referred to [facility] for discharge planning / disposition and no further needs at this time from this [facility].   Draxton Luu H. [name], BSN, [place] Management [facility] Telephonic CM Phone: [phone] Fax: [phone]

## 2018-11-05 NOTE — Progress Notes (Signed)
[name], MRN:  [id number], DOB:  [birth year], LOS: 3 ADMISSION DATE:  11/02/2018, CONSULTATION DATE:  11/02/2018 REFERRING MD:  Dr. Roel Cluck, CHIEF COMPLAINT:  SOB  Brief History   48 yoF with hx of COPD, chronic hypoxic respiratory failure, patient of Dr. [name] presenting with exertional shortness of breath and hypoxia at her home O2.  Admitted by [facility] for acute COPD exacerbation.  PCCM consulted for further pulmonary recommendations  History of present illness    73 year old female with past medical history of COPD, chronic hypoxic respiratory failure on 6 L O2, ongoing tobacco abuse, diastolic heart failure,  pulmonary hypertension, mediastinal mass, pulmonary nodules, hyperlipidemia, anxiety, diabetes, fatigue, and GERD presenting with progressive exertional dyspnea since last night.  She is followed at in our office by Dr. [name] currently maintained on Trelegy Ellipta and prednisone 20 mg at baseline daily.  Previously treated on 10/19/2018 with Doxy and prednisone taper for acute COPD exacerbation, however she has had multiple exacerbations this year for same.  She is followed at home palliative care but has not been ready to proceed fully with hospice care.  Palliative care nurse found her today with increased shortness of breath and 75% on 6 L O2.  Patient insisted on telephone visit with our office who Wyn Quaker, NP recommended ER evaluation.  Denies fevers, lower extremity swelling, productive cough above baseline, chest pain, no sick contacts.    In ER, afebrile, hemodynamically stable treated with HFNC.  ABG 7.403/ 56.5/ 63 / 35.3, Labs BNP 72, WBC 10.7, neg Ddimer.  Treated with BiPAP after negative COVID, solumedrol, mag, duonebs and levaquin.  CXR shows new small right pleural effusion with possible fall loculated left effusion, as to the left base may be scarring versus pneumonia.  Admitted by [facility].  PCCM consulted for further pulmonary recommendations.   Past Medical  History  COPD, chronic hypoxic respiratory failure on 6 L O2, ongoing tobacco abuse, diastolic heart failure,  pulmonary hypertension, mediastinal mass, pulmonary nodules, hyperlipidemia, anxiety, diabetes, fatigue, and GERD  Significant Hospital Events   11/03/2018 admit  Consults:   Procedures:   Significant Diagnostic Tests:   08/27/2018-echocardiogram- LV ejection fraction 60 to 65%, increased left ventricular wall thickness, impaired relaxation, RV has normal systolic function, cavity was normal, mild diastolic dysfunction  [id number] function test- FVC 1.46 (64% predicted), FEV1 0.69 (39% predicted), ratio 47  10/30/2016-CT chest with contrast- stable appearance of mediastinal adenopathy, bilateral hilar adenopathy is identified, small nonspecific pulmonary nodules are unchanged from previous exam, emphysema  Micro Data:  10/12 SARS CoV2 >>neg 10/13 RVP >> 10/13 exp sputum cx >> 10/13 pneumocystis >>  Antimicrobials:  10/19/2018 doxy and pred  Interim history/subjective:  No events.  Less somnolent but wants the higher dose of xanax again.  Dropped her back to 6L.  Objective   Blood pressure (!) 100/49, pulse 88, temperature 98 F (36.7 C), temperature source Oral, resp. rate 20, weight 96.5 kg, SpO2 100 %.        Intake/Output Summary (Last 24 hours) at 11/05/2018 1548 Last data filed at 11/05/2018 1200 Gross per 24 hour  Intake 612.77 ml  Output 1075 ml  Net -462.23 ml   Filed Weights   11/02/18 1711 11/04/18 0500 11/05/18 0500  Weight: 97.1 kg 96.5 kg 96.5 kg   Examination: GEN: obese woman lying in bed on BIPAP HEENT: MMM, malampatti 4 CV: Heart sounds difficult to auscultate, ext warm PULM: Distant, no accessory muscle use GI: Soft, hypoactive BS EXT:  no edema, chronic discoloration NEURO: moves all 4 ext to command PSYCH: RASS 0 SKIN: No rashes   Resolved Hospital Problem list    Assessment & Plan:  # Acute on chronic hypoxemic and  hypercapneic respiratory failure secondary to AE-ILD, improving # Anxiety/depression complicated objective assessment of breathing # Frail elderly # Nonresolving and nonprogressive mediastinal and hilar adenopathy, do not think this is malignant given stability # Deconditioning, significant  - Continue nebs, azithromycin, steroids as ordered, stop date steroids 4 more days as ordered - Xanax 0.5mg  TID (home dose), fine for 0.25mg  qHS PRN - Trial off PAP at night - If has another admission, would try to get her NIV for home - Not willing to do hospice - Will arrange for f/u in clinic ~2-4 weeks after discharge - Will sign off as patient is near home O2 requirements, call if questions or concerns  Erskine Emery MD PCCM

## 2018-11-05 NOTE — Progress Notes (Signed)
PROGRESS NOTE    [name]  [id number] DOB: Jul 20, 1945 DOA: 11/02/2018 PCP: Binnie Rail, MD   Brief Narrative:  HPI on 11/02/2018 by Dr. Toy Baker [name] is a 73 y.o. female with medical history significant of COPD requiring 6 L of oxygen at all times, bronchiectasis, diastolic CHF, HLD, HTN, mediastinal mass, CT, pulmonary nodules, Tobacco abuse psoriasis, GERD, diabetes  Presented with   Dyspnea, during the night when patient was moving about she had developed worsening shortness of breath she called her pulmonologist who told her to come to emergency department she have had some mild cough no fevers no chills no chest pain no leg edema she is unaware of any sick contacts.  She have not tried anything for her illness.    Interim history Patient presented with shortness of breath and was placed on BiPAP in the emergency department, along with breathing treatments, Solu-Medrol, Levaquin.  Chest x-ray showed small right pleural effusion, possible loculated left effusion.  PCCM consulted and appreciated. Assessment & Plan   COPD exacerbation/acute on chronic hypoxemic respiratory failure and hyper hypercarbia -Patient uses 6 L of oxygen at baseline -has been requiring BiPAP, now on nasal canula however 9L -COVID-19 negative -Continue prednisone, Brovana/Pulmicort, ipratropium, azithromycin -PCCM consulted and appreciated-recommending palliative consult  Mediastinal LAD and pulmonary nodules -Patient will need outpatient follow-up  Diabetes mellitus, type II-uncontrolled -Hemoglobin A1c 9.6 -Continue Lantus and insulin sliding scale with CBG monitoring  Chronic diastolic heart failure -Currently appears to be euvolemic and compensated -Monitor intake and output, daily weights -Continue lasix  GERD -Continue PPI  Depression/anxiety -Continue Paxil, Xanax  Morbid obesity -BMI 37.9 -Patient to follow-up with primary care physician to discuss lifestyle  modifications  Leukocytosis -Suspect secondary to steroids -trended down today 12.1  DVT Prophylaxis  lovenox  Code Status: DNI  Family Communication: None at bedside  Disposition Plan: Admitted. Pending improvement in respiratory status and palliative care consult.  Consultants PCCM Palliative care  Procedures  None  Antibiotics   Anti-infectives (From admission, onward)   Start     Dose/Rate Route Frequency Ordered Stop   11/04/18 2200  azithromycin (ZITHROMAX) tablet 500 mg     500 mg Oral Daily 11/02/18 2158 11/08/18 0959   11/03/18 2200  azithromycin (ZITHROMAX) 500 mg in sodium chloride 0.9 % 250 mL IVPB     500 mg 250 mL/hr over 60 Minutes Intravenous Every 24 hours 11/02/18 2158 11/04/18 0039   11/02/18 1900  levofloxacin (LEVAQUIN) IVPB 750 mg     750 mg 100 mL/hr over 90 Minutes Intravenous  Once 11/02/18 1854 11/02/18 2226      Subjective:   [name] seen and examined today.  Currently receiving breathing treatment.  Feels breathing is okay.  Has not really improved or worsened.  Denies current chest pain, shortness of breath, abdominal pain, nausea or vomiting, diarrhea or constipation, dizziness or headache.  Patient is upset that her Xanax dose was decreased.  Objective:   Vitals:   11/05/18 0300 11/05/18 0500 11/05/18 0509 11/05/18 0753  BP:   (!) 99/53 (!) 90/53  Pulse: 90  91 88  Resp:   18 18  Temp:   98.5 F (36.9 C) 98.3 F (36.8 C)  TempSrc:   Oral Oral  SpO2:   100% 100%  Weight:  96.5 kg      Intake/Output Summary (Last 24 hours) at 11/05/2018 1000 Last data filed at 11/05/2018 0512 Gross per 24 hour  Intake 1098.77 ml  Output  125 ml  Net 973.77 ml   Filed Weights   11/02/18 1711 11/04/18 0500 11/05/18 0500  Weight: 97.1 kg 96.5 kg 96.5 kg   Exam  General: Well developed, chronically ill appearing, NAD  HEENT: NCAT, mucous membranes moist.   Neck: Supple  Cardiovascular: Difficult to auscultate due to body habitus.      Respiratory: Distant, diminished breath sounds, no active wheezing  Abdomen: Soft, obese, nontender, nondistended, + bowel sounds  Extremities: warm dry without cyanosis clubbing or edema  Neuro: AAOx3, nonfocal  Psych: Appropriate mood and affect  Data Reviewed: I have personally reviewed following labs and imaging studies  CBC: Recent Labs  Lab 11/02/18 1729 11/02/18 1831 11/03/18 0326 11/04/18 0511 11/05/18 0514  WBC 10.7*  --  12.4* 18.5* 12.1*  NEUTROABS 8.0*  --   --   --   --   HGB 10.3* 12.6 9.6* 9.7* 9.6*  HCT 41.2 37.0 36.8 37.8 36.6  MCV 79.8*  --  78.3* 78.4* 78.0*  PLT 361  --  382 422* [id number]   Basic Metabolic Panel: Recent Labs  Lab 11/02/18 1729 11/02/18 1831 11/03/18 0326 11/04/18 0511 11/05/18 0514  NA 142 143 141 141 141  K 3.2* 3.1* 4.2 3.8 4.1  CL 99  --  101 103 104  CO2 30  --  28 28 26   GLUCOSE 209*  --  260* 139* 135*  BUN 12  --  13 17 22   CREATININE 0.96  --  0.74 0.93 1.13*  CALCIUM 8.7*  --  8.6* 8.9 8.9  MG  --   --  2.2  --   --   PHOS  --   --  2.2*  --   --    GFR: Estimated Creatinine Clearance: 50.5 mL/min (A) (by C-G formula based on SCr of 1.13 mg/dL (H)). Liver Function Tests: Recent Labs  Lab 11/02/18 1729 11/03/18 0326  AST 19 16  ALT 17 16  ALKPHOS 69 63  BILITOT 0.5 0.3  PROT 6.6 6.1*  ALBUMIN 3.5 3.2*   No results for input(s): LIPASE, AMYLASE in the last 168 hours. No results for input(s): AMMONIA in the last 168 hours. Coagulation Profile: No results for input(s): INR, PROTIME in the last 168 hours. Cardiac Enzymes: No results for input(s): CKTOTAL, CKMB, CKMBINDEX, TROPONINI in the last 168 hours. BNP (last 3 results) No results for input(s): PROBNP in the last 8760 hours. HbA1C: Recent Labs    11/02/18 2235  HGBA1C 9.4*   CBG: Recent Labs  Lab 11/04/18 0725 11/04/18 1118 11/04/18 1616 11/04/18 2107 11/05/18 0607  GLUCAP 138* 150* 269* 181* 150*   Lipid Profile: No results for input(s):  CHOL, HDL, LDLCALC, TRIG, CHOLHDL, LDLDIRECT in the last 72 hours. Thyroid Function Tests: Recent Labs    11/03/18 0326  TSH 0.559   Anemia Panel: No results for input(s): VITAMINB12, FOLATE, FERRITIN, TIBC, IRON, RETICCTPCT in the last 72 hours. Urine analysis:    Component Value Date/Time   COLORURINE YELLOW 07/04/2015 1848   APPEARANCEUR CLOUDY (A) 07/04/2015 1848   LABSPEC 1.040 (H) 07/04/2015 1848   PHURINE 6.0 07/04/2015 1848   GLUCOSEU >1000 (A) 07/04/2015 1848   GLUCOSEU NEGATIVE 04/09/2006 1315   HGBUR NEGATIVE 07/04/2015 1848   BILIRUBINUR NEGATIVE 07/04/2015 1848   KETONESUR NEGATIVE 07/04/2015 1848   PROTEINUR NEGATIVE 07/04/2015 1848   UROBILINOGEN 1.0 04/04/2010 2323   NITRITE NEGATIVE 07/04/2015 1848   LEUKOCYTESUR NEGATIVE 07/04/2015 1848   Sepsis Labs: @LABRCNTIP (procalcitonin:4,lacticidven:4)  )  Recent Results (from the past 240 hour(s))  SARS Coronavirus 2 by RT PCR (hospital order, performed in [facility] hospital lab) Nasopharyngeal Nasopharyngeal Swab     Status: None   Collection Time: 11/02/18  5:53 PM   Specimen: Nasopharyngeal Swab  Result Value Ref Range Status   SARS Coronavirus 2 NEGATIVE NEGATIVE Final    Comment: (NOTE) If result is NEGATIVE SARS-CoV-2 target nucleic acids are NOT DETECTED. The SARS-CoV-2 RNA is generally detectable in upper and lower  respiratory specimens during the acute phase of infection. The lowest  concentration of SARS-CoV-2 viral copies this assay can detect is 250  copies / mL. A negative result does not preclude SARS-CoV-2 infection  and should not be used as the sole basis for treatment or other  patient management decisions.  A negative result may occur with  improper specimen collection / handling, submission of specimen other  than nasopharyngeal swab, presence of viral mutation(s) within the  areas targeted by this assay, and inadequate number of viral copies  (<250 copies / mL). A negative result must be  combined with clinical  observations, patient history, and epidemiological information. If result is POSITIVE SARS-CoV-2 target nucleic acids are DETECTED. The SARS-CoV-2 RNA is generally detectable in upper and lower  respiratory specimens dur ing the acute phase of infection.  Positive  results are indicative of active infection with SARS-CoV-2.  Clinical  correlation with patient history and other diagnostic information is  necessary to determine patient infection status.  Positive results do  not rule out bacterial infection or co-infection with other viruses. If result is PRESUMPTIVE POSTIVE SARS-CoV-2 nucleic acids MAY BE PRESENT.   A presumptive positive result was obtained on the submitted specimen  and confirmed on repeat testing.  While 2019 novel coronavirus  (SARS-CoV-2) nucleic acids may be present in the submitted sample  additional confirmatory testing may be necessary for epidemiological  and / or clinical management purposes  to differentiate between  SARS-CoV-2 and other Sarbecovirus currently known to infect humans.  If clinically indicated additional testing with an alternate test  methodology [phone]) is advised. The SARS-CoV-2 RNA is generally  detectable in upper and lower respiratory sp ecimens during the acute  phase of infection. The expected result is Negative. Fact Sheet for Patients:  StrictlyIdeas.no Fact Sheet for Healthcare Providers: BankingDealers.co.za This test is not yet approved or cleared by the [place] FDA and has been authorized for detection and/or diagnosis of SARS-CoV-2 by FDA under an Emergency Use Authorization (EUA).  This EUA will remain in effect (meaning this test can be used) for the duration of the COVID-19 declaration under Section 564(b)(1) of the Act, 21 U.S.C. section 360bbb-3(b)(1), unless the authorization is terminated or revoked sooner. Performed at [place], [place] [address] [place], [place] [address]   MRSA PCR Screening     Status: None   Collection Time: 11/04/18  8:46 AM   Specimen: Nasopharyngeal  Result Value Ref Range Status   MRSA by PCR NEGATIVE NEGATIVE Final    Comment:        The GeneXpert MRSA Assay (FDA approved for NASAL specimens only), is one component of a comprehensive MRSA colonization surveillance program. It is not intended to diagnose MRSA infection nor to guide or monitor treatment for MRSA infections. Performed at [facility], [place] [address] [place], [place] [address]       Radiology Studies: No results found.   Scheduled Meds: . acetaZOLAMIDE  250  mg Oral BID  . ALPRAZolam  0.5 mg Oral TID  . arformoterol  15 mcg Nebulization BID  . azithromycin  500 mg Oral Daily  . budesonide (PULMICORT) nebulizer solution  0.5 mg Nebulization BID  . enoxaparin (LOVENOX) injection  40 mg Subcutaneous Q24H  . feeding supplement (GLUCERNA SHAKE)  237 mL Oral TID BM  . furosemide  40 mg Oral Daily  . insulin aspart  0-9 Units Subcutaneous TID WC  . insulin glargine  32 Units Subcutaneous Q2200  . ipratropium-albuterol  3 mL Nebulization BID  . multivitamin with minerals  1 tablet Oral Daily  . pantoprazole  40 mg Oral Daily  . PARoxetine  40 mg Oral q morning - 10a  . predniSONE  40 mg Oral Q breakfast  . sodium chloride flush  3 mL Intravenous Q12H  . sodium chloride flush  3 mL Intravenous Q12H   Continuous Infusions: . sodium chloride 250 mL (11/03/18 2337)     LOS: 3 days   Time Spent in minutes   30 minutes  Matisse Salais D.O. on 11/05/2018 at 10:00 AM  Between 7am to 7pm - Please see pager noted on [email or web address]  After 7pm go to [email or web address]  And look for the night coverage person covering for me after hours  [facility] Office  [phone]

## 2018-11-05 NOTE — Progress Notes (Signed)
Inpatient Diabetes Program Recommendations  AACE/ADA: New Consensus Statement on Inpatient Glycemic Control (2015)  Target Ranges:  Prepandial:   less than 140 mg/dL      Peak postprandial:   less than 180 mg/dL (1-2 hours)      Critically ill patients:  140 - 180 mg/dL   Lab Results  Component Value Date   GLUCAP 279 (H) 11/05/2018   HGBA1C 9.4 (H) 11/02/2018    Review of Glycemic Control Results for [name], [name] (MRN [id number]) as of 11/05/2018 17:01  Ref. Range 11/05/2018 06:07 11/05/2018 12:11 11/05/2018 16:04  Glucose-Capillary Latest Ref Range: 70 - 99 mg/dL 150 (H) 224 (H) 279 (H)  Diabetes history: DM 2 Outpatient Diabetes medications:  Lantus 32 units daily Current orders for Inpatient glycemic control:  Prednisone 40 mg q AM Novolog sensitive tid with meals Lantus 32 units q HS  Inpatient Diabetes Program Recommendations:   If appropriate, consider adding Novolog 3 units tid with meals (hold if patient eats less than 50%).  Thanks,  Adah Perl, RN, BC-ADM Inpatient Diabetes Coordinator Pager [phone]

## 2018-11-05 NOTE — Care Management Important Message (Signed)
Important Message  Patient Details  Name: [name] MRN: [id number] Date of Birth: [birth year]   Medicare Important Message Given:  Yes     Shelda Altes 11/05/2018, 1:58 PM

## 2018-11-05 NOTE — Consult Note (Signed)
   [facility] CM Inpatient Consult   11/05/2018  Navira Petrash Dec 27, 1945 [id number]  Patient screened for high risk score for unplanned readmission score of 24% in the Medicare ACO.   Review of patient's medical record reveals patient is per MD progress notes which includes but not limited to: 73 year old female with past medical history of COPD, chronic hypoxic respiratory failure on 6 L O2, ongoing tobacco abuse, diastolic heart failure,  pulmonary hypertension, mediastinal mass, pulmonary nodules, hyperlipidemia, anxiety, diabetes, fatigue, and GERD presenting with progressive exertional dyspnea since last night. Also, noted patient active with [facility] home palliative program and reviewed Palliative Care consult.  Plan:  Will follow for progress and disposition needs with inpatient [facility] team..  For questions, please contact:  Natividad Brood, RN BSN [facility] Liaison  [phone] business mobile phone Toll free office [phone]  Fax number: [phone] [email or web address]

## 2018-11-05 NOTE — Progress Notes (Signed)
Physical Therapy Treatment Patient Details Name: [name] MRN: [id number] DOB: 23-Dec-1945 Today's Date: 11/05/2018    History of Present Illness [name] is a 73 y.o. female with medical history significant of COPD requiring 6 L of oxygen at all times, bronchiectasis, diastolic CHF, HLD, HTN, mediastinal mass, CT, pulmonary nodules,presenting with SOB and dyspnea    PT Comments    Patient seen for mobility progression. Pt continues to present with 2-3/4 DOE and SpO2 desat to 84% on 15L O2 via [place]. Pt was on 10L O2 upon arrival and while at rest in supine pt with SpO2 of 89-90%. Ambulated short distance on 10L initially however SpO2 dropped to 76%. Pt educated throughout session on PLB technique. Pt requires seated rest breaks while ambulating. Continue to progress as tolerated.   Follow Up Recommendations  Home health PT;Supervision - Intermittent     Equipment Recommendations  None recommended by PT    Recommendations for Other Services       Precautions / Restrictions Precautions Precautions: Fall Precaution Comments: watch 02 Restrictions Weight Bearing Restrictions: No    Mobility  Bed Mobility Overal bed mobility: Needs Assistance Bed Mobility: Supine to Sit     Supine to sit: Min assist     General bed mobility comments: OT assisted pt to EOB   Transfers Overall transfer level: Needs assistance Equipment used: (rollator) Transfers: Sit to/from Stand Sit to Stand: Supervision         General transfer comment: cues to use brakes on rollator each trial however pt did not follow cues and laughing at therapist; pt educated on safe use of rollator for rest breaks to decrease risk of fall  Ambulation/Gait Ambulation/Gait assistance: Supervision Gait Distance (Feet): (~35 ft X 2 with seated break and then 75 ft ) Assistive device: (rollator) Gait Pattern/deviations: Step-through pattern;Decreased stride length;Trunk flexed Gait velocity: decreased    General Gait Details: pt with 2-3/4 DOE and SpO2 desat to 84% on 15L O2 via [place]; cues for PLB technique   Stairs             Wheelchair Mobility    Modified Rankin (Stroke Patients Only)       Balance Overall balance assessment: Mild deficits observed, not formally tested                                          Cognition Arousal/Alertness: Awake/alert Behavior During Therapy: WFL for tasks assessed/performed Overall Cognitive Status: Impaired/Different from baseline Area of Impairment: Safety/judgement                         Safety/Judgement: Decreased awareness of safety            Exercises      General Comments        Pertinent Vitals/Pain Pain Assessment: No/denies pain    Home Living Family/patient expects to be discharged to:: Private residence Living Arrangements: Alone Available Help at Discharge: Personal care attendant(2x/wk) Type of Home: Apartment Home Access: Stairs to enter Entrance Stairs-Rails: Right Home Layout: One level Home Equipment: Environmental consultant - 4 wheels;Bedside commode;Adaptive equipment;Other (comment)(02)      Prior Function Level of Independence: Needs assistance  Gait / Transfers Assistance Needed: walks with a rollator ADL's / Homemaking Assistance Needed: aide helps with heavy meal prep, housekeeping and bathing     PT Goals (current goals can  now be found in the care plan section) Acute Rehab PT Goals Patient Stated Goal: to sleep Progress towards PT goals: Progressing toward goals    Frequency    Min 3X/week      PT Plan Current plan remains appropriate    Co-evaluation              AM-PAC PT "6 Clicks" Mobility   Outcome Measure  Help needed turning from your back to your side while in a flat bed without using bedrails?: None Help needed moving from lying on your back to sitting on the side of a flat bed without using bedrails?: None Help needed moving to and from a bed  to a chair (including a wheelchair)?: A Little Help needed standing up from a chair using your arms (e.g., wheelchair or bedside chair)?: A Little Help needed to walk in hospital room?: A Little Help needed climbing 3-5 steps with a railing? : A Little 6 Click Score: 20    End of Session Equipment Utilized During Treatment: Gait belt Activity Tolerance: Other (comment)(limited by DOE and SpO2 desat) Patient left: in chair;with call bell/phone within reach Nurse Communication: Mobility status PT Visit Diagnosis: Unsteadiness on feet (R26.81);Difficulty in walking, not elsewhere classified (R26.2)     Time: [id number] PT Time Calculation (min) (ACUTE ONLY): 28 min  Charges:  $Gait Training: 23-37 mins                     Earney Navy, PTA Acute Rehabilitation Services Pager: [phone] Office: [phone]     Darliss Cheney 11/05/2018, 12:18 PM

## 2018-11-05 NOTE — Evaluation (Signed)
Occupational Therapy Evaluation Patient Details Name: [name] MRN: [id number] DOB: September 19, 1945 Today's Date: 11/05/2018    History of Present Illness [name] is a 73 y.o. female with medical history significant of COPD requiring 6 L of oxygen at all times, bronchiectasis, diastolic CHF, HLD, HTN, mediastinal mass, CT, pulmonary nodules,presenting with SOB and dyspnea   Clinical Impression   Pt walks with a rollator and is assisted 2x a week by an aide for meal prep, housekeeping and bathing. Pt presents with decreased activity tolerance, impaired standing balance and difficulty reaching her feet for ADL. Pt requires maximum encouragement to work with therapy. Will follow acutely.    Follow Up Recommendations  No OT follow up    Equipment Recommendations  None recommended by OT    Recommendations for Other Services       Precautions / Restrictions Precautions Precautions: Fall Precaution Comments: watch 02 Restrictions Weight Bearing Restrictions: No      Mobility Bed Mobility Overal bed mobility: Needs Assistance Bed Mobility: Supine to Sit     Supine to sit: Min assist     General bed mobility comments: pulled up on therapist's hands  Transfers Overall transfer level: Needs assistance Equipment used: Rolling walker (2 wheeled) Transfers: Sit to/from Stand Sit to Stand: Supervision         General transfer comment: supervision for safety, cues to use brakes on rollator    Balance Overall balance assessment: Mild deficits observed, not formally tested                                         ADL either performed or assessed with clinical judgement   ADL Overall ADL's : Needs assistance/impaired Eating/Feeding: Independent   Grooming: Set up;Sitting   Upper Body Bathing: Set up;Sitting   Lower Body Bathing: Sit to/from stand;Moderate assistance   Upper Body Dressing : Set up;Sitting   Lower Body Dressing: Moderate  assistance;Sit to/from stand   Toilet Transfer: Supervision/safety;Stand-pivot;BSC   Toileting- Water quality scientist and Hygiene: Supervision/safety;Sit to/from stand       Functional mobility during ADLs: Supervision/safety;Rolling walker       Vision Patient Visual Report: No change from baseline       Perception     Praxis      Pertinent Vitals/Pain Pain Assessment: No/denies pain     Hand Dominance Right   Extremity/Trunk Assessment Upper Extremity Assessment Upper Extremity Assessment: Overall WFL for tasks assessed   Lower Extremity Assessment Lower Extremity Assessment: Defer to PT evaluation   Cervical / Trunk Assessment Cervical / Trunk Assessment: Normal   Communication Communication Communication: No difficulties   Cognition Arousal/Alertness: Awake/alert Behavior During Therapy: WFL for tasks assessed/performed Overall Cognitive Status: Impaired/Different from baseline Area of Impairment: Safety/judgement                         Safety/Judgement: Decreased awareness of safety         General Comments       Exercises     Shoulder Instructions      Home Living Family/patient expects to be discharged to:: Private residence Living Arrangements: Alone Available Help at Discharge: Personal care attendant(2x/wk) Type of Home: Apartment Home Access: Stairs to enter [employer] of Steps: 2 Entrance Stairs-Rails: Right Home Layout: One level     Bathroom Shower/Tub: Teacher, early years/pre: Standard  Home Equipment: Blende - 4 wheels;Bedside commode;Adaptive equipment;Other (comment)(02) Adaptive Equipment: Reacher        Prior Functioning/Environment Level of Independence: Needs assistance  Gait / Transfers Assistance Needed: walks with a rollator ADL's / Homemaking Assistance Needed: aide helps with heavy meal prep, housekeeping and bathing            OT Problem List: Decreased activity  tolerance;Impaired balance (sitting and/or standing);Decreased knowledge of use of DME or AE;Decreased safety awareness      OT Treatment/Interventions: Self-care/ADL training;DME and/or AE instruction;Patient/family education;Therapeutic activities;Energy conservation    OT Goals(Current goals can be found in the care plan section) Acute Rehab OT Goals Patient Stated Goal: to sleep OT Goal Formulation: With patient Time For Goal Achievement: 11/19/18 Potential to Achieve Goals: Good  OT Frequency: Min 2X/week   Barriers to D/C:            Co-evaluation              AM-PAC OT "6 Clicks" Daily Activity     Outcome Measure Help from another person eating meals?: None Help from another person taking care of personal grooming?: A Little Help from another person toileting, which includes using toliet, bedpan, or urinal?: A Little Help from another person bathing (including washing, rinsing, drying)?: A Lot Help from another person to put on and taking off regular upper body clothing?: None Help from another person to put on and taking off regular lower body clothing?: A Lot 6 Click Score: 18   End of Session Equipment Utilized During Treatment: Gait belt;Rolling walker;Oxygen  Activity Tolerance: Patient limited by fatigue Patient left: Other (comment)(with PT)  OT Visit Diagnosis: Unsteadiness on feet (R26.81);Other abnormalities of gait and mobility (R26.89);Muscle weakness (generalized) (M62.81);Other (comment)(decreased activity tolerance)                Time: [id number] OT Time Calculation (min): 23 min Charges:  OT General Charges $OT Visit: 1 Visit OT Evaluation $OT Eval Moderate Complexity: 1 Mod  Nestor Lewandowsky, OTR/L Acute Rehabilitation Services Pager: [phone] Office: [phone]  Malka So 11/05/2018, 10:34 AM

## 2018-11-05 NOTE — Consult Note (Signed)
Consultation Note Date: 11/05/2018   Patient Name: [name]  DOB: Jan 07, 1946  MRN: [id number]  Age / Sex: 73 y.o., female  PCP: Binnie Rail, MD Referring Physician: Cristal Ford, DO  Reason for Consultation:   Establish  GOC and emotional support   HPI/Patient Profile: 73 y.o. female   admitted on 11/02/2018 with past medical history significant of COPD requiring 6 L of oxygen at all times, bronchiectasis, diastolic CHF, HLD, HTN, mediastinal mass, CT, pulmonary nodules,ongoing tobacco abuse, psoriasis, GERD, diabetes.  Admitted through the ER with  worsening shortness of breath and cough.  She had recent admission for similar situation in  August 20202 and was discharged to a SNF.  Apparently was doing well, ran out of paid days and declined once she returned home.   She lives at home alone.  Has been seen by OP [facility].  [name] has been involved in the past.  She has PCS services for a home aide 2X a week for 4 hours each day.  She has family but her son/[name] tells me "she has pushed everyone away"   She has three sons and other family in the area.   Patient feels "that family don't care"  Patient is high risk for decompensation 2/2 to serious life limiting co-morbidites.  Patient faces treatment option decisions, advanced directive decsions and anticiapatory care needs.   Clinical Assessment and Goals of Care:   This NP Wadie Lessen reviewed medical records, received report from team,  and then spoke to the patient by phone  to discuss diagnosis, prognosis, GOC,  disposition and options. Concept of Palliative Care were discussed  A discussion was had today regarding advanced directives.  Concepts specific to code status, artifical feeding and hydration, continued IV antibiotics and rehospitalization was had.  The difference between a aggressive medical intervention path   and a palliative comfort care path for this patient at this time was had.  Values and goals of care important to patient and family were attempted to be elicited.  Patient tells me she "is tired of everyone asking me the same questions; I want to live a few more years"  Created space and opportunity for patient to explore her thoughts and feelings regarding her current situation.  She tells me she feels lonely, has family in the area but that they are not attentive.  Emotional support offered.   Questions and concerns addressed.   Patient is encouraged to call with questions or concerns.    PMT will continue to support holistically.     No documented HPOA. I spoke to her son/ Lanice Schwab who was very receptive to conversation regaring his mother. He is concerned about.   He verbalizes frustration with "her attitude" and poor choices.    SUMMARY OF RECOMMENDATIONS    Code Status/Advance Care Planning:  -  Partial as previously documented/no intubation  Additional Recommendations (Limitations, Scope, Preferences):  Patient is open to all offered and available medical interventions to prolong life  Psycho-social/Spiritual:  Desire for further Chaplaincy support:yes   Prognosis:   Unable to determine, depends on desire for life prolonging measures  Discharge Planning:   To be determined.     Likely home with previous support network unless eligible for short term rehab     Primary Diagnoses: Present on Admission:  Obesity, unspecified  Hyperlipidemia  GERD (gastroesophageal reflux disease)  Physical deconditioning  Pulmonary hypertension (HCC)  COPD with acute exacerbation (HCC)  Acute on chronic respiratory failure with hypoxia (HCC)  COPD exacerbation (HCC)  Hypokalemia   I have reviewed the medical record, interviewed the patient and family, and examined the patient. The following aspects are pertinent.  Past Medical History:  Diagnosis Date    Arrhythmia    Bronchiectasis march 2012   On CT chest. RUL. Mild   CHF (congestive heart failure) (HCC)    Chicken pox    Chronic diastolic heart failure (HCC)    Chronic respiratory failure ([place])    Followed in Pulmonary clinic/ [facility]/ Ramaswamy  - 06/30/2012 desat to 86%  RA walking 50 ft, recovered to 90% at rest - 06/30/2012  Walked 1lpm x 3 laps @ 185 ft each stopped due to  Sob, no desat  rec 02 2lpm with activity and sleeping, ok at rest    COPD (chronic obstructive pulmonary disease) ([place])     FEV-1 in 2008 was 63% with a diffusion capacity of 33%.    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   Social History   Socioeconomic History   Marital status: Divorced    Spouse name: Not on file   Number of children: 0   Years of education: Not on file   Highest education level: Not on file  Occupational History   Occupation: retired  Scientist, product/process development strain: Not very hard   Food insecurity    Worry: Never true    Inability: Never true   Transportation needs    Medical: Yes    Non-medical: No  Tobacco Use   Smoking status: Current Every Day Smoker    Packs/day: 0.25    Years: 50.00    Pack years: 12.50    Types: Cigarettes   Smokeless tobacco: Never Used   Tobacco comment: 3-4 cigaretters per day 11/20/2017   Substance and Sexual Activity   Alcohol use: Yes    Alcohol/week: 0.0 standard drinks    Comment: occasional   Drug use: No   Sexual  activity: Not Currently  Lifestyle   Physical activity    Days per week: 0 days    Minutes per session: 0 min   Stress: To some extent  Relationships   Social connections    Talks on phone: Never    Gets together: Never    Attends religious service: Not on file    Active member of club or organization: Not on file  Attends meetings of clubs or organizations: Not on file    Relationship status: Divorced  Other Topics Concern   Not on file  Social History Narrative   Married but recently separated from husband   Family History  Problem Relation Age of Onset   Other Father        MVA   Esophageal cancer Mother    Scheduled Meds:  acetaZOLAMIDE  250 mg Oral BID   ALPRAZolam  0.5 mg Oral TID   arformoterol  15 mcg Nebulization BID   azithromycin  500 mg Oral Daily   budesonide (PULMICORT) nebulizer solution  0.5 mg Nebulization BID   enoxaparin (LOVENOX) injection  40 mg Subcutaneous Q24H   feeding supplement (GLUCERNA SHAKE)  237 mL Oral TID BM   furosemide  40 mg Oral Daily   insulin aspart  0-9 Units Subcutaneous TID WC   insulin glargine  32 Units Subcutaneous Q2200   ipratropium-albuterol  3 mL Nebulization BID   multivitamin with minerals  1 tablet Oral Daily   pantoprazole  40 mg Oral Daily   PARoxetine  40 mg Oral q morning - 10a   predniSONE  40 mg Oral Q breakfast   sodium chloride flush  3 mL Intravenous Q12H   sodium chloride flush  3 mL Intravenous Q12H   Continuous Infusions:  sodium chloride 250 mL (11/03/18 2337)   PRN Meds:.sodium chloride, acetaminophen **OR** acetaminophen, albuterol, HYDROcodone-acetaminophen, ondansetron **OR** ondansetron (ZOFRAN) IV, sodium chloride flush Medications Prior to Admission:  Prior to Admission medications   Medication Sig Start Date End Date Taking? Authorizing Provider  albuterol (PROVENTIL HFA;VENTOLIN HFA) 108 (90 Base) MCG/ACT inhaler Inhale 2 puffs into the lungs every 6 (six) hours as  needed for wheezing or shortness of breath. 12/24/17  Yes Brand Males, MD  ALPRAZolam Duanne Moron) 0.5 MG tablet Take 1 tablet (0.5 mg total) by mouth 3 (three) times daily. 10/14/18  Yes Burns, Claudina Lick, MD  diphenhydramine-acetaminophen (TYLENOL PM) 25-500 MG TABS tablet Take 2 tablets by mouth at bedtime as needed (pain/sleep).   Yes [provider]  fluticasone (FLONASE) 50 MCG/ACT nasal spray Place 2 sprays into both nostrils daily. 10/19/18  Yes Brand Males, MD  Fluticasone-Umeclidin-Vilant (TRELEGY ELLIPTA) 100-62.5-25 MCG/INH AEPB Inhale 1 puff into the lungs daily. 07/16/18  Yes Brand Males, MD  furosemide (LASIX) 40 MG tablet Take 40 mg by mouth daily.    Yes [provider]  GLUCOCOM LANCETS 33G MISC Use with Test Strips to take blood sugars 07/20/15  Yes Burns, Claudina Lick, MD  Insulin Pen Needle (BD PEN NEEDLE NANO U/F) 32G X 4 MM MISC USE TO ADMINISTER LANTUS INSULIN 05/08/16  Yes Burns, Claudina Lick, MD  LANTUS SOLOSTAR 100 UNIT/ML Solostar Pen INJECT 32 UNITS INTO THE SKIN DAILY AT 10 PM. Patient taking differently: Inject 32 Units into the skin at bedtime. DAILY AT 10 PM. 10/14/18  Yes Burns, Claudina Lick, MD  nystatin (MYCOSTATIN) 100000 UNIT/ML suspension Take 5 mLs (500,000 Units total) by mouth 4 (four) times daily. Patient taking differently: Take 5 mLs by mouth See admin instructions. Swish and swallow 5 mls every morning after using Trelegy 07/29/18  Yes Parrett, Tammy S, NP  pantoprazole (PROTONIX) 40 MG tablet Take 1 tablet (40 mg total) by mouth daily. 01/26/18  Yes Burns, Claudina Lick, MD  PARoxetine (PAXIL) 40 MG tablet TAKE 1 TABLET BY MOUTH EVERY DAY IN THE MORNING. Patient taking differently: Take 40 mg by mouth every morning. TAKE 1 TABLET BY  MOUTH EVERY DAY IN THE MORNING. 08/20/18  Yes Burns, Claudina Lick, MD  predniSONE (DELTASONE) 20 MG tablet Take 1 tablet (20 mg total) by mouth daily with breakfast. 07/29/18  Yes Parrett, Tammy S, NP  acetaZOLAMIDE (DIAMOX) 250 MG  tablet Take 1 tablet (250 mg total) by mouth 2 (two) times daily. Patient not taking: Reported on 11/02/2018 09/05/18   Nita Sells, MD   Allergies  Allergen Reactions   Ambien [Zolpidem Tartrate] Other (See Comments)    Causes nightmares   Review of Systems  Physical Exam  Vital Signs: BP (!) 90/53 (BP Location: Right Arm)    Pulse 88    Temp 98.3 F (36.8 C) (Oral)    Resp 18    Wt 96.5 kg    SpO2 100%    BMI 37.68 kg/m  Pain Scale: 0-10   Pain Score: 0-No pain   SpO2: SpO2: 100 % O2 Device:SpO2: 100 % O2 Flow Rate: .O2 Flow Rate (L/min): 15 L/min  IO: Intake/output summary:   Intake/Output Summary (Last 24 hours) at 11/05/2018 0944 Last data filed at 11/05/2018 [id number] Gross per 24 hour  Intake 1098.77 ml  Output 125 ml  Net 973.77 ml    LBM:   Baseline Weight: Weight: 97.1 kg Most recent weight: Weight: 96.5 kg     Palliative Assessment/Data:  The above conversation was completed via telephone due to the restrictions during the COVID-19 pandemic. Thorough chart review and discussion with necessary members of the care team was completed as part of assessment. All issues were discussed and addressed but no physical exam was performed.   Discussed with Dr Ree Kida  and bedside RN.  [facility] also [facility] case for added support if possible  Time In: 1000 Time Out: 1115 Time Total: 75 minutes Greater than 50%  of this time was spent counseling and coordinating care related to the above assessment and plan.  Signed by: Wadie Lessen, NP   Please contact Palliative Medicine Team phone at [phone] for questions and concerns.  For individual provider: See Shea Evans

## 2018-11-06 ENCOUNTER — Telehealth: Payer: Self-pay

## 2018-11-06 LAB — BASIC METABOLIC PANEL
Anion gap: 8 (ref 5–15)
BUN: 19 mg/dL (ref 8–23)
CO2: 28 mmol/L (ref 22–32)
Calcium: 8.6 mg/dL — ABNORMAL LOW (ref 8.9–10.3)
Chloride: 104 mmol/L (ref 98–111)
Creatinine, Ser: 0.85 mg/dL (ref 0.44–1.00)
GFR calc Af Amer: >60 mL/min (ref >60)
GFR calc non Af Amer: >60 mL/min (ref >60)
Glucose, Bld: 184 mg/dL — ABNORMAL HIGH (ref 70–99)
Potassium: 3.5 mmol/L (ref 3.5–5.1)
Sodium: 140 mmol/L (ref 135–145)

## 2018-11-06 LAB — CBC
HCT: 36 % (ref 36.0–46.0)
Hemoglobin: 9.2 g/dL — ABNORMAL LOW (ref 12.0–15.0)
MCH: 20 pg — ABNORMAL LOW (ref 26.0–34.0)
MCHC: 25.6 g/dL — ABNORMAL LOW (ref 30.0–36.0)
MCV: 78.4 fL — ABNORMAL LOW (ref 80.0–100.0)
Platelets: 406 10*3/uL — ABNORMAL HIGH (ref 150–400)
RBC: 4.59 MIL/uL (ref 3.87–5.11)
RDW: 19 % — ABNORMAL HIGH (ref 11.5–15.5)
WBC: 11.2 10*3/uL — ABNORMAL HIGH (ref 4.0–10.5)
nRBC: 0.7 % — ABNORMAL HIGH (ref 0.0–0.2)

## 2018-11-06 LAB — GLUCOSE, CAPILLARY
Glucose-Capillary: 165 mg/dL — ABNORMAL HIGH (ref 70–99)
Glucose-Capillary: 268 mg/dL — ABNORMAL HIGH (ref 70–99)

## 2018-11-06 MED ORDER — GLUCERNA SHAKE PO LIQD: 237.0000 mL | Freq: Three times a day (TID) | ORAL | 0 refills | Status: DC

## 2018-11-06 MED ORDER — PREDNISONE 20 MG PO TABS: 40.0000 mg | ORAL_TABLET | Freq: Every day | ORAL | 0 refills | Status: DC

## 2018-11-06 MED ORDER — AZITHROMYCIN 500 MG PO TABS: 500.0000 mg | ORAL_TABLET | Freq: Every day | ORAL | 0 refills | Status: DC

## 2018-11-06 NOTE — Progress Notes (Signed)
Inpatient Diabetes Program Recommendations  AACE/ADA: New Consensus Statement on Inpatient Glycemic Control (2015)  Target Ranges:  Prepandial:   less than 140 mg/dL      Peak postprandial:   less than 180 mg/dL (1-2 hours)      Critically ill patients:  140 - 180 mg/dL   Results for ZEDA, WACLAWSKI (MRN [id number]) as of 11/06/2018 08:24  Ref. Range 11/05/2018 06:07 11/05/2018 12:11 11/05/2018 16:04 11/05/2018 20:43  Glucose-Capillary Latest Ref Range: 70 - 99 mg/dL 150 (H)  1 unit NOVOLOG  224 (H)  3 units NOVOLOG  279 (H)  5 units NOVOLOG  220 (H)    32 units LANTUS    Home DM Meds: Lantus 32 units Daily  Current Orders: Lantus 32 units QHS     Novolog Sensitive Correction Scale/ SSI (0-9 units) TID AC     MD- Note patient getting Prednisone 40 mg Daily.  Having elevated afternoon CBGs likely from the Prednisone.  If within goals of care, please consider adding Novolog Meal Coverage: Novolog 3 units TID with meals  (Please add the following Hold Parameters: Hold if pt eats <50% of meal, Hold if pt NPO)     --Will follow patient during hospitalization--  Wyn Quaker RN, MSN, CDE Diabetes Coordinator Inpatient Glycemic Control Team Team Pager: [phone] (8a-5p)

## 2018-11-06 NOTE — Progress Notes (Addendum)
Occupational Therapy Treatment Patient Details Name: [name] MRN: [id number] DOB: 1945-03-03 Today's Date: 11/06/2018    History of present illness [name] is a 73 y.o. female with medical history significant of COPD requiring 6 L of oxygen at all times, bronchiectasis, diastolic CHF, HLD, HTN, mediastinal mass, CT, pulmonary nodules,presenting with SOB and dyspnea   OT comments  Pt making steady progress towards OT goals this session. Pt supervisions sit>stand from EOB with RW; cues to push up from sitting surface rather than pull on RW. Pt saturated in urine; MAX A for LB bathing in standing. Pt required MAX A to don new mesh underwear and pull up to waist line. Demo'ed use of sock aid for LB dressing with pt verbalizing understanding. Pt on 5L HFNC SaO2 during session; O2 dropped to 83 with functional mobility and HR reached 136. Cues throughout session for pt to incorporate PLB technique. Initiated education on energy conservation strategies to carryover at home. Provided functional examples with pt verbalizing understanding.  DC plan remains appropriate contingent on continued progress as pt is currently very deconditioned and requires recurrent rest breaks during minimal functional mobility. Will continue to follow acutely for OT needs.   Follow Up Recommendations  No OT follow up    Equipment Recommendations  None recommended by OT;Other (comment)(DME needs are met)    Recommendations for Other Services      Precautions / Restrictions Precautions Precautions: Fall Precaution Comments: watch 02 Restrictions Weight Bearing Restrictions: No       Mobility Bed Mobility Overal bed mobility: Needs Assistance Bed Mobility: Supine to Sit     Supine to sit: Supervision     General bed mobility comments: superivison for safety  Transfers Overall transfer level: Needs assistance Equipment used: Rolling walker (2 wheeled) Transfers: Sit to/from [employer]  Transfers Sit to Stand: Supervision Stand pivot transfers: Supervision       General transfer comment: pt pulling up on RW despite cues; supervision for safety during transfers    Balance Overall balance assessment: Needs assistance Sitting-balance support: Feet supported Sitting balance-Leahy Scale: Fair     Standing balance support: Bilateral upper extremity supported Standing balance-Leahy Scale: Poor                             ADL either performed or assessed with clinical judgement   ADL Overall ADL's : Needs assistance/impaired             Lower Body Bathing: Sit to/from stand;Maximal assistance Lower Body Bathing Details (indicate cue type and reason): MAX A for LB bathing after incontient episode     Lower Body Dressing: Maximal assistance;Sit to/from stand;With adaptive equipment Lower Body Dressing Details (indicate cue type and reason): MAX A to thread underwear and pull to waist line; MAX A to don sock with sock aid Toilet Transfer: Supervision/safety;Stand-pivot;BSC;RW   Toileting- Clothing Manipulation and Hygiene: Maximal assistance;Sit to/from stand Toileting - Clothing Manipulation Details (indicate cue type and reason): MAX for hygiene     Functional mobility during ADLs: Supervision/safety;Rolling walker General ADL Comments: MAX A for LB ADL sit>stand; pt supervision for transfers; de-sats quickly, very deconditioned     Vision Patient Visual Report: No change from baseline     Perception     Praxis      Cognition Arousal/Alertness: Awake/alert Behavior During Therapy: WFL for tasks assessed/performed Overall Cognitive Status: Impaired/Different from baseline Area of Impairment: Safety/judgement  Safety/Judgement: Decreased awareness of safety;Decreased awareness of deficits     General Comments: cues for safety throughout and to continue PLB technique        Exercises     Shoulder  Instructions       General Comments pt on HFNC O2 5L throughout session; de-sat to 83 upon sit>stand with HR MAX 136; O2 rebounded with seated rest break;ended session with O2 90 and HR 101    Pertinent Vitals/ Pain       Pain Assessment: Faces Faces Pain Scale: Hurts a little bit Pain Location: low back Pain Descriptors / Indicators: Aching;Discomfort Pain Intervention(s): Monitored during session;Repositioned  Home Living                                          Prior Functioning/Environment              Frequency  Min 2X/week        Progress Toward Goals  OT Goals(current goals can now be found in the care plan section)  Progress towards OT goals: Progressing toward goals  Acute Rehab OT Goals Patient Stated Goal: to sleep OT Goal Formulation: With patient Time For Goal Achievement: 11/19/18 Potential to Achieve Goals: Good  Plan Discharge plan remains appropriate    Co-evaluation                 AM-PAC OT "6 Clicks" Daily Activity     Outcome Measure   Help from another person eating meals?: None Help from another person taking care of personal grooming?: A Little Help from another person toileting, which includes using toliet, bedpan, or urinal?: A Little Help from another person bathing (including washing, rinsing, drying)?: A Lot Help from another person to put on and taking off regular upper body clothing?: None Help from another person to put on and taking off regular lower body clothing?: A Lot 6 Click Score: 18    End of Session Equipment Utilized During Treatment: Gait belt;Rolling walker;Oxygen;Other (comment)(HFNC 5L)  OT Visit Diagnosis: Unsteadiness on feet (R26.81);Other abnormalities of gait and mobility (R26.89);Muscle weakness (generalized) (M62.81);Other (comment)   Activity Tolerance Other (comment)(limited by decreased activity tolerance)   Patient Left in bed;with call bell/phone within reach;with bed alarm  set   Nurse Communication Mobility status;Other (comment)(pure wic out; can stand pivot to BSC with RW and 1 assist)        Time: 0935-1005 OT Time Calculation (min): 30 min  Charges: OT General Charges $OT Visit: 1 Visit OT Treatments $Self Care/Home Management : 23-37 mins  Mary Kate Clegg, COTA/L Acute Rehabilitation Services [phone]    Mary K Clegg 11/06/2018, 10:20 AM    

## 2018-11-06 NOTE — Consult Note (Addendum)
   [facility] CM Inpatient Consult   11/06/2018  Wendelin Frangipane 01-19-1946 [id number]  Follow up:  Spoke with the patient at the bedside phone, HIPAA verified.  Patient was getting ready for transitioning home and asked if questions could be asked by the care manager assigned to her. Verbal consent for follow up.  She has home health and a personal care provider but did not want to get into more details. She states, "I am trying to get home can't they just ask me questions, then?"  Will have patient assigned for post hospital complex disease follow up with extreme high risk for unplanned readmission scores.  Primary Care Provider: Billey Gosling, MD office provides the [facility].  Natividad Brood, RN BSN [facility] Liaison  [phone] business mobile phone Toll free office [phone]  Fax number: [phone] [email or web address]

## 2018-11-06 NOTE — Telephone Encounter (Signed)
-----   Message from Randa Spike, [place] sent at 11/05/2018  4:24 PM EDT ----- Regarding: FW: Nonurgent f/u  ----- Message ----- From: Candee Furbish, MD Sent: 11/05/2018   4:00 PM EDT To: Lbpu Triage Pool Subject: Nonurgent f/u                                  2-4 weeks with MR or midlevel

## 2018-11-06 NOTE — Telephone Encounter (Signed)
LMTCB

## 2018-11-06 NOTE — TOC Initial Note (Addendum)
Transition of Care [facility]) - Initial/Assessment Note    Patient Details  Name: [name] MRN: [id number] Date of Birth: 09-11-1945  Transition [facility]) CM/SW Contact:    Zenon Mayo, RN Phone Number: 11/06/2018, 1:06 PM  Clinical Narrative:                 Patient for dc today, will resume HHRN, HHPT with [facility], [place] notified regarding dc, will resume with palliative servicers with [facility] notified regarding dc today, patient has home oxygen with [employer], she needs tanks to be brought up to room, Zack notified to bring tanks to room prior to dc. She did have services with [facility] Previously but they were unable to engage with patient.  .   Expected Discharge Plan: [place] Barriers to Discharge: No Barriers Identified   Patient Goals and CMS Choice Patient states their goals for this hospitalization and ongoing recovery are:: get better CMS Medicare.gov Compare Post Acute Care list provided to:: Patient Choice offered to / list presented to : Patient  Expected Discharge Plan and Services Expected Discharge Plan: [place]   Discharge Planning Services: CM Consult Post Acute Care Choice: [place] arrangements for the past 2 months: Single Family Home Expected Discharge Date: 11/06/18               DME Arranged: (NA)         HH Arranged: RN, PT [place] Agency: [place] ([place]) Date HH Agency Contacted: 11/06/18 Time [place]: 1303 Representative spoke with at [place]: [place] Arrangements/Services Living arrangements for the past 2 months: [place] with:: Self Patient language and need for interpreter reviewed:: Yes Do you feel safe going back to the place where you live?: Yes      Need for Family Participation in Patient Care: Yes (Comment) Care giver support system in place?: Yes (comment) Current home services: Home PT, Home RN Criminal  Activity/Legal Involvement Pertinent to Current Situation/Hospitalization: No - Comment as needed  Activities of Daily Living Home Assistive Devices/Equipment: Walker (specify type)([place]) ADL Screening (condition at time of admission) Patient's cognitive ability adequate to safely complete daily activities?: Yes Is the patient deaf or have difficulty hearing?: No Does the patient have difficulty seeing, even when wearing glasses/contacts?: No Does the patient have difficulty concentrating, remembering, or making decisions?: No Patient able to express need for assistance with ADLs?: Yes Does the patient have difficulty dressing or bathing?: No Independently performs ADLs?: Yes (appropriate for developmental age) Does the patient have difficulty walking or climbing stairs?: No Weakness of Legs: None Weakness of Arms/Hands: None  Permission Sought/Granted Permission sought to share information with : Case Manager Permission granted to share information with : Yes, Verbal Permission Granted              Emotional Assessment Appearance:: Appears stated age Attitude/Demeanor/Rapport: (appropriate) Affect (typically observed): Appropriate Orientation: : Oriented to Self, Oriented to Place, Oriented to  Time, Oriented to Situation Alcohol / Substance Use: Not Applicable Psych Involvement: No (comment)  Admission diagnosis:  COPD exacerbation ([place]) [J44.1] Patient Active Problem List   Diagnosis Date Noted  . Palliative care by specialist   . DNR (do not resuscitate) discussion   . COPD exacerbation ([place]) 11/02/2018  . Hypokalemia 11/02/2018  . Acute on chronic respiratory failure with hypoxia ([place]) 08/26/2018  . Hyperkalemia 08/26/2018  . COPD with acute exacerbation ([place]) 01/26/2018  .  Psoriasis 07/01/2017  . Depression 06/24/2016  . Pneumonia of both lungs due to infectious organism   . Chronic respiratory failure with hypoxia ([place])   . Pulmonary hypertension ([place])   .  Physical deconditioning 11/15/2015  . GERD (gastroesophageal reflux disease) 11/01/2015  . 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 Diabetes ([place]) 01/23/2015  . Fatigue 01/20/2015  . Chronic obstructive pulmonary disease ([place]) 10/18/2014  . Obesity, unspecified 03/15/2013  . Acute on chronic diastolic CHF (congestive heart failure) ([place]) 03/14/2013  . Chronic respiratory failure ([place]) 06/30/2012  . COPD (chronic obstructive pulmonary disease) ([place])   . Tobacco abuse   . Pulmonary nodule 03/22/2010  . 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 Bronchiectasis ([place]) 03/22/2010  . Hyperlipidemia 06/13/2008  . Anxiety 02/20/2007  . MITRAL VALVE PROLAPSE 02/20/2007  . CERVICAL CANCER, HX OF 02/20/2007   PCP:  Binnie Rail, MD Pharmacy:   [id number] - [place], [place] [address] Phone: [phone] Fax: [phone]     Social Determinants of Health ([place]) Interventions    Readmission Risk Interventions Readmission Risk Prevention Plan 11/06/2018 11/05/2018  Transportation Screening Complete Complete  PCP or Specialist Appt within 3-5 Days Complete -  HRI or [place] Complete Complete  Social Work Consult for [place] Planning/Counseling Complete Complete  Palliative Care Screening Complete Complete  Medication Review Press photographer) Complete Complete  Some recent data might be hidden

## 2018-11-06 NOTE — Discharge Instructions (Signed)
Chronic Obstructive Pulmonary Disease °Chronic obstructive pulmonary disease (COPD) is a long-term (chronic) lung problem. When you have COPD, it is hard for air to get in and out of your lungs. Usually the condition gets worse over time, and your lungs will never return to normal. There are things you can do to keep yourself as healthy as possible. °· Your doctor may treat your condition with: °? Medicines. °? Oxygen. °? Lung surgery. °· Your doctor may also recommend: °? Rehabilitation. This includes steps to make your body work better. It may involve a team of specialists. °? Quitting smoking, if you smoke. °? Exercise and changes to your diet. °? Comfort measures (palliative care). °Follow these instructions at home: °Medicines °· Take over-the-counter and prescription medicines only as told by your doctor. °· Talk to your doctor before taking any cough or allergy medicines. You may need to avoid medicines that cause your lungs to be dry. °Lifestyle °· If you smoke, stop. Smoking makes the problem worse. If you need help quitting, ask your doctor. °· Avoid being around things that make your breathing worse. This may include smoke, chemicals, and fumes. °· Stay active, but remember to rest as well. °· Learn and use tips on how to relax. °· Make sure you get enough sleep. Most adults need at least 7 hours of sleep every night. °· Eat healthy foods. Eat smaller meals more often. Rest before meals. °Controlled breathing °Learn and use tips on how to control your breathing as told by your doctor. Try: °· Breathing in (inhaling) through your nose for 1 second. Then, pucker your lips and breath out (exhale) through your lips for 2 seconds. °· Putting one hand on your belly (abdomen). Breathe in slowly through your nose for 1 second. Your hand on your belly should move out. Pucker your lips and breathe out slowly through your lips. Your hand on your belly should move in as you breathe out. ° °Controlled coughing °Learn  and use controlled coughing to clear mucus from your lungs. Follow these steps: °1. Lean your head a little forward. °2. Breathe in deeply. °3. Try to hold your breath for 3 seconds. °4. Keep your mouth slightly open while coughing 2 times. °5. Spit any mucus out into a tissue. °6. Rest and do the steps again 1 or 2 times as needed. °General instructions °· Make sure you get all the shots (vaccines) that your doctor recommends. Ask your doctor about a flu shot and a pneumonia shot. °· Use oxygen therapy and pulmonary rehabilitation if told by your doctor. If you need home oxygen therapy, ask your doctor if you should buy a tool to measure your oxygen level (oximeter). °· Make a COPD action plan with your doctor. This helps you to know what to do if you feel worse than usual. °· Manage any other conditions you have as told by your doctor. °· Avoid going outside when it is very hot, cold, or humid. °· Avoid people who have a sickness you can catch (contagious). °· Keep all follow-up visits as told by your doctor. This is important. °Contact a doctor if: °· You cough up more mucus than usual. °· There is a change in the color or thickness of the mucus. °· It is harder to breathe than usual. °· Your breathing is faster than usual. °· You have trouble sleeping. °· You need to use your medicines more often than usual. °· You have trouble doing your normal activities such as getting dressed   or walking around the house. °Get help right away if: °· You have shortness of breath while resting. °· You have shortness of breath that stops you from: °? Being able to talk. °? Doing normal activities. °· Your chest hurts for longer than 5 minutes. °· Your skin color is more blue than usual. °· Your pulse oximeter shows that you have low oxygen for longer than 5 minutes. °· You have a fever. °· You feel too tired to breathe normally. °Summary °· Chronic obstructive pulmonary disease (COPD) is a long-term lung problem. °· The way your  lungs work will never return to normal. Usually the condition gets worse over time. There are things you can do to keep yourself as healthy as possible. °· Take over-the-counter and prescription medicines only as told by your doctor. °· If you smoke, stop. Smoking makes the problem worse. °This information is not intended to replace advice given to you by your health care provider. Make sure you discuss any questions you have with your health care provider. °Document Released: 06/26/2007 Document Revised: 12/20/2016 Document Reviewed: 02/12/2016 °Elsevier Patient Education © 2020 [employer] Inc. ° °

## 2018-11-06 NOTE — TOC Transition Note (Signed)
Transition of Care [facility]) - CM/SW Discharge Note   Patient Details  Name: [name] MRN: [id number] Date of Birth: 23-Mar-1945  Transition of Care [facility]) CM/SW Contact:  Zenon Mayo, RN Phone Number: 11/06/2018, 1:11 PM   Clinical Narrative:    Patient for dc today, will resume HHRN, [facility]PT with [facility], [place] notified regarding dc, will resume with palliative servicers with [facility], [place] notified regarding dc today, patient has home oxygen with [employer], she needs tanks to be brought up to room, Zack notified to bring tanks to room prior to dc. She did have services with [facility] Previously but they were unable to engage with patient   Final next level of care: [place] Barriers to Discharge: No Barriers Identified   Patient Goals and CMS Choice Patient states their goals for this hospitalization and ongoing recovery are:: get better CMS Medicare.gov Compare Post Acute Care list provided to:: Patient Choice offered to / list presented to : Patient  Discharge Placement                       Discharge Plan and Services   Discharge Planning Services: CM Consult Post Acute Care Choice: Home Health          DME Arranged: (NA)         HH Arranged: RN, Disease Management, PT [place] Agency: [place] ([place]) Date HH Agency Contacted: 11/06/18 Time [place]: 1311 Representative spoke with at [place]: [place] ([place]) Interventions     Readmission Risk Interventions Readmission Risk Prevention Plan 11/06/2018 11/05/2018  Transportation Screening Complete Complete  PCP or Specialist Appt within 3-5 Days Complete -  HRI or [place] Complete Complete  Social Work Consult for [place] Planning/Counseling Complete Complete  Palliative Care Screening Complete Complete  Medication Review Press photographer) Complete Complete  Some recent data might be hidden

## 2018-11-06 NOTE — Discharge Summary (Addendum)
Physician Discharge Summary  [name] ZOX:[id number] DOB: 1945/08/13 DOA: 11/02/2018  PCP: Binnie Rail, MD  Admit date: 11/02/2018 Discharge date: 11/06/2018  Time spent: 45 minutes  Recommendations for Outpatient Follow-up:  Patient will be discharged to home with home health physical therapy.  Patient will need to follow up with primary care provider within one week of discharge.  Follow up with pulmonology. Patient should continue medications as prescribed.  Patient should follow a carb modified diet.   Discharge Diagnoses:  COPD exacerbation/acute on chronic hypoxemic respiratory failure and hyper hypercarbia Mediastinal LAD and pulmonary nodules Diabetes mellitus, type II-uncontrolled Chronic diastolic heart failure GERD Depression/anxiety Morbid obesity Leukocytosis  Discharge Condition: Stable  Diet recommendation: carb modified  Filed Weights   11/04/18 0500 11/05/18 0500 11/06/18 0255  Weight: 96.5 kg 96.5 kg 95.5 kg    History of present illness:  on 11/02/2018 by Dr. Toy Baker Jaxie Ndiayeis a 73 y.o.femalewith medical history significant of COPD requiring 6 L of oxygen at all times, bronchiectasis, diastolic CHF, HLD, HTN, mediastinal mass, CT, pulmonary nodules, Tobacco abuse psoriasis, GERD, diabetes  Presented with Dyspnea, during the night when patient was moving about she had developed worsening shortness of breath she called her pulmonologist who told her to come to emergency department she have had some mild cough no fevers no chills no chest pain no leg edema she is unaware of any sick contacts. She have not tried anything for her illness.   Hospital Course:  COPD exacerbation/acute on chronic hypoxemic respiratory failure and hyper hypercarbia -Patient uses 6 L of oxygen at baseline -had been requiring BiPAP, now back to 6L (baseline) -COVID-19 negative -was placed prednisone, Brovana/Pulmicort, ipratropium, azithromycin -PCCM  consulted and appreciated-recommending palliative consult -Palliative care met with patient and discussed with son -Continue Trelegy Ellipta on discharge -Continue prednisone '40mg'$  daily for 2 days then back to down to home dose -Continue Azithromycin for 2 additional days  Mediastinal LAD and pulmonary nodules -Patient will need outpatient follow-up  Diabetes mellitus, type II-uncontrolled -Hemoglobin A1c 9.6 -Continue Lantus and insulin sliding scale with CBG monitoring  Chronic diastolic heart failure -Currently appears to be euvolemic and compensated -Monitor intake and output, daily weights -Continue lasix  GERD -Continue PPI  Depression/anxiety -Continue Paxil, Xanax  Morbid obesity -BMI 37.9 -Patient to follow-up with primary care physician to discuss lifestyle modifications  Leukocytosis -Suspect secondary to steroids -trended down today 11.1  Goals of care -as above, palliative care consulted and appreciated -continue partial code, DNI  Physical deconditioning -secondary to multifactorial causes including COPD exac, resp failure, obesity, etc -PT recommended [facility]  Consultants PCCM Palliative care  Procedures  None  Discharge Exam: Vitals:   11/06/18 0841 11/06/18 0843  BP:    Pulse:    Resp:    Temp:    SpO2: 95% 95%     General: Well developed, chronically ill appearing, AND  HEENT: NCAT, mucous membranes moist.  Cardiovascular: S1 S2 auscultated, RRR (distant) due to body habitus  Respiratory: diminished breath sounds, distant   Abdomen: Soft, obese, nontender, nondistended, + bowel sounds  Extremities: warm dry without cyanosis clubbing or edema  Neuro: AAOx3, nonfocal  Psych: Normal affect and demeanor   Discharge Instructions Discharge Instructions    Discharge instructions   Complete by: As directed    Patient will be discharged to home with home health physical therapy.  Patient will need to follow up with primary care  provider within one week of discharge.  Follow up  with pulmonology. Patient should continue medications as prescribed.  Patient should follow a carb modified diet.     Allergies as of 11/06/2018      Reactions   Ambien [zolpidem Tartrate] Other (See Comments)   Causes nightmares      Medication List    TAKE these medications   acetaZOLAMIDE 250 MG tablet Commonly known as: DIAMOX Take 1 tablet (250 mg total) by mouth 2 (two) times daily.   albuterol 108 (90 Base) MCG/ACT inhaler Commonly known as: VENTOLIN HFA Inhale 2 puffs into the lungs every 6 (six) hours as needed for wheezing or shortness of breath.   ALPRAZolam 0.5 MG tablet Commonly known as: XANAX Take 1 tablet (0.5 mg total) by mouth 3 (three) times daily.   azithromycin 500 MG tablet Commonly known as: ZITHROMAX Take 1 tablet (500 mg total) by mouth daily. Continuation of hospital course Start taking on: November 07, 2018   diphenhydramine-acetaminophen 25-500 MG Tabs tablet Commonly known as: TYLENOL PM Take 2 tablets by mouth at bedtime as needed (pain/sleep).   feeding supplement (GLUCERNA SHAKE) Liqd Take 237 mLs by mouth 3 (three) times daily between meals.   fluticasone 50 MCG/ACT nasal spray Commonly known as: FLONASE Place 2 sprays into both nostrils daily.   furosemide 40 MG tablet Commonly known as: LASIX Take 40 mg by mouth daily.   GlucoCom Lancets 33G Misc Use with Test Strips to take blood sugars   Insulin Pen Needle 32G X 4 MM Misc Commonly known as: BD Pen Needle Nano U/F USE TO ADMINISTER LANTUS INSULIN   Lantus SoloStar 100 UNIT/ML Solostar Pen Generic drug: Insulin Glargine INJECT 32 UNITS INTO THE SKIN DAILY AT 10 PM. What changed: See the new instructions.   nystatin 100000 UNIT/ML suspension Commonly known as: MYCOSTATIN Take 5 mLs (500,000 Units total) by mouth 4 (four) times daily. What changed:   when to take this  additional instructions   pantoprazole 40 MG  tablet Commonly known as: PROTONIX Take 1 tablet (40 mg total) by mouth daily.   PARoxetine 40 MG tablet Commonly known as: PAXIL TAKE 1 TABLET BY MOUTH EVERY DAY IN THE MORNING. What changed:   how much to take  how to take this  when to take this   predniSONE 20 MG tablet Commonly known as: DELTASONE Take 2 tablets (40 mg total) by mouth daily with breakfast. Continue home dose after 2 days of '40mg'$ . Start taking on: November 07, 2018 What changed:   how much to take  additional instructions   Trelegy Ellipta 100-62.5-25 MCG/INH Aepb Generic drug: Fluticasone-Umeclidin-Vilant Inhale 1 puff into the lungs daily.      Allergies  Allergen Reactions   Ambien [Zolpidem Tartrate] Other (See Comments)    Causes nightmares   Follow-up Information    Binnie Rail, MD. Schedule an appointment as soon as possible for a visit in 1 day(s).   Specialty: Internal Medicine Why: Hospital follow up Contact information: [place] [address] [phone]        Brand Males, MD. Schedule an appointment as soon as possible for a visit in 1 week(s).   Specialty: Pulmonary Disease Why: Hospital follow up Contact information: [place] [address] [phone]            The results of significant diagnostics from this hospitalization (including imaging, microbiology, ancillary and laboratory) are listed below for reference.    Significant Diagnostic Studies: D[facility]  1 View  Result Date: 11/02/2018 CLINICAL DATA:  Shortness of breath EXAM: PORTABLE CHEST 1 VIEW COMPARISON:  August 28, 2018 FINDINGS: Cardiomegaly is stable. Pulmonary vascularity is within normal limits. Consolidation is noted in the left base with suspected loculated pleural effusion. There is a smaller pleural effusion on the right. Interstitium is somewhat thickened diffusely, stable. No adenopathy evident. No bone lesions. IMPRESSION: 1. New small right  pleural effusion. Stable apparent loculated pleural effusion on the left. 2. Airspace opacity left base, stable. Question scarring versus pneumonia. Both entities may be present concurrently. 3.  Somewhat diffuse interstitial thickening, stable. 4. Stable cardiomegaly. Pulmonary vascularity is stable and grossly normal. Electronically Signed   By: Lowella Grip III M.D.   On: 11/02/2018 17:48    Microbiology: Recent Results (from the past 240 hour(s))  SARS Coronavirus 2 by RT PCR (hospital order, performed in [facility] hospital lab) Nasopharyngeal Nasopharyngeal Swab     Status: None   Collection Time: 11/02/18  5:53 PM   Specimen: Nasopharyngeal Swab  Result Value Ref Range Status   SARS Coronavirus 2 NEGATIVE NEGATIVE Final    Comment: (NOTE) If result is NEGATIVE SARS-CoV-2 target nucleic acids are NOT DETECTED. The SARS-CoV-2 RNA is generally detectable in upper and lower  respiratory specimens during the acute phase of infection. The lowest  concentration of SARS-CoV-2 viral copies this assay can detect is 250  copies / mL. A negative result does not preclude SARS-CoV-2 infection  and should not be used as the sole basis for treatment or other  patient management decisions.  A negative result may occur with  improper specimen collection / handling, submission of specimen other  than nasopharyngeal swab, presence of viral mutation(s) within the  areas targeted by this assay, and inadequate number of viral copies  (<250 copies / mL). A negative result must be combined with clinical  observations, patient history, and epidemiological information. If result is POSITIVE SARS-CoV-2 target nucleic acids are DETECTED. The SARS-CoV-2 RNA is generally detectable in upper and lower  respiratory specimens dur ing the acute phase of infection.  Positive  results are indicative of active infection with SARS-CoV-2.  Clinical  correlation with patient history and other diagnostic  information is  necessary to determine patient infection status.  Positive results do  not rule out bacterial infection or co-infection with other viruses. If result is PRESUMPTIVE POSTIVE SARS-CoV-2 nucleic acids MAY BE PRESENT.   A presumptive positive result was obtained on the submitted specimen  and confirmed on repeat testing.  While 2019 novel coronavirus  (SARS-CoV-2) nucleic acids may be present in the submitted sample  additional confirmatory testing may be necessary for epidemiological  and / or clinical management purposes  to differentiate between  SARS-CoV-2 and other Sarbecovirus currently known to infect humans.  If clinically indicated additional testing with an alternate test  methodology [phone]) is advised. The SARS-CoV-2 RNA is generally  detectable in upper and lower respiratory sp ecimens during the acute  phase of infection. The expected result is Negative. Fact Sheet for Patients:  StrictlyIdeas.no Fact Sheet for Healthcare Providers: BankingDealers.co.za This test is not yet approved or cleared by the [place] FDA and has been authorized for detection and/or diagnosis of SARS-CoV-2 by FDA under an Emergency Use Authorization (EUA).  This EUA will remain in effect (meaning this test can be used) for the duration of the COVID-19 declaration under Section 564(b)(1) of the Act, 21 U.S.C. section 360bbb-3(b)(1), unless the authorization is terminated or  revoked sooner. Performed at [facility], [place] [address], [place], [place] [address]   MRSA PCR Screening     Status: None   Collection Time: 11/04/18  8:46 AM   Specimen: Nasopharyngeal  Result Value Ref Range Status   MRSA by PCR NEGATIVE NEGATIVE Final    Comment:        The GeneXpert MRSA Assay (FDA approved for NASAL specimens only), is one component of a comprehensive MRSA colonization surveillance program. It is not intended to diagnose  MRSA infection nor to guide or monitor treatment for MRSA infections. Performed at [facility], [place] [address], [place], [place] [address]      Labs: Basic Metabolic Panel: Recent Labs  Lab 11/02/18 1729 11/02/18 1831 11/03/18 0326 11/04/18 0511 11/05/18 0514 11/06/18 0340  NA 142 143 141 141 141 140  K 3.2* 3.1* 4.2 3.8 4.1 3.5  CL 99  --  101 103 104 104  CO2 30  --  '28 28 26 28  '$ GLUCOSE 209*  --  260* 139* 135* 184*  BUN 12  --  '13 17 22 19  '$ CREATININE 0.96  --  0.74 0.93 1.13* 0.85  CALCIUM 8.7*  --  8.6* 8.9 8.9 8.6*  MG  --   --  2.2  --   --   --   PHOS  --   --  2.2*  --   --   --    Liver Function Tests: Recent Labs  Lab 11/02/18 1729 11/03/18 0326  AST 19 16  ALT 17 16  ALKPHOS 69 63  BILITOT 0.5 0.3  PROT 6.6 6.1*  ALBUMIN 3.5 3.2*   No results for input(s): LIPASE, AMYLASE in the last 168 hours. No results for input(s): AMMONIA in the last 168 hours. CBC: Recent Labs  Lab 11/02/18 1729 11/02/18 1831 11/03/18 0326 11/04/18 0511 11/05/18 0514 11/06/18 0340  WBC 10.7*  --  12.4* 18.5* 12.1* 11.2*  NEUTROABS 8.0*  --   --   --   --   --   HGB 10.3* 12.6 9.6* 9.7* 9.6* 9.2*  HCT 41.2 37.0 36.8 37.8 36.6 36.0  MCV 79.8*  --  78.3* 78.4* 78.0* 78.4*  PLT 361  --  382 422* 338 406*   Cardiac Enzymes: No results for input(s): CKTOTAL, CKMB, CKMBINDEX, TROPONINI in the last 168 hours. BNP: BNP (last 3 results) Recent Labs    08/26/18 1633 11/02/18 1729  BNP 374.2* 72.3    ProBNP (last 3 results) No results for input(s): PROBNP in the last 8760 hours.  CBG: Recent Labs  Lab 11/05/18 1211 11/05/18 1604 11/05/18 2043 11/06/18 0541 11/06/18 1122  GLUCAP 224* 279* 220* 165* 268*       Signed:  Enslie Sahota  [facility] 11/06/2018, 11:34 AM

## 2018-11-06 NOTE — Progress Notes (Signed)
Responded to consult for spiritual support. Hast was awake and wanting to talk. She expressed a desire to be discharge today. She said that she has support system two days a week at her home. She stay alone. She mentioned her son in in the [employer] and getting ready to retire soon.  Also, she says she is irritated at the idea that she doesn't get help form anyone. I offered spiritual care with words of comfort, empathic listening, ministry of presence, and prayer. She seemed happy for the visit.   Chaplain Resident,  Fidel Levy [phone]

## 2018-11-06 NOTE — Plan of Care (Signed)
°  Problem: Education: °Goal: Knowledge of General Education information will improve °Description: Including pain rating scale, medication(s)/side effects and non-pharmacologic comfort measures °Outcome: Progressing °  °Problem: Clinical Measurements: °Goal: Ability to maintain clinical measurements within normal limits will improve °Outcome: Progressing °  °Problem: Nutrition: °Goal: Adequate nutrition will be maintained °Outcome: Progressing °  °

## 2018-11-07 DIAGNOSIS — I959 Hypotension, unspecified: Secondary | ICD-10-CM | POA: Diagnosis not present

## 2018-11-07 DIAGNOSIS — J449 Chronic obstructive pulmonary disease, unspecified: Secondary | ICD-10-CM | POA: Diagnosis not present

## 2018-11-07 DIAGNOSIS — E1165 Type 2 diabetes mellitus with hyperglycemia: Secondary | ICD-10-CM | POA: Diagnosis not present

## 2018-11-07 DIAGNOSIS — I5033 Acute on chronic diastolic (congestive) heart failure: Secondary | ICD-10-CM | POA: Diagnosis not present

## 2018-11-07 DIAGNOSIS — J9621 Acute and chronic respiratory failure with hypoxia: Secondary | ICD-10-CM | POA: Diagnosis not present

## 2018-11-07 DIAGNOSIS — I110 Hypertensive heart disease with heart failure: Secondary | ICD-10-CM | POA: Diagnosis not present

## 2018-11-09 ENCOUNTER — Other Ambulatory Visit: Payer: Self-pay | Admitting: *Deleted

## 2018-11-09 NOTE — Progress Notes (Signed)
Virtual Visit via telephne Note  I connected with [name] on 11/10/18 at  9:00 AM EDT by a telephone and verified that I am speaking with the correct person using two identifiers.   I discussed the limitations of evaluation and management by telemedicine and the availability of in person appointments. The patient expressed understanding and agreed to proceed.  The patient is currently at home and I am in the office.    No referring provider.    History of Present Illness: This visit is follow up from her recent hospital stay.  Admitted 10/12 - 10/16 for COPD exacerbation with acute on chronic hyoxemic resp failure and hypercarbia  She went to the ED with dyspnea with exertion.  She had a mild cough, no fever, chest pain or edema.    COPD exac, acute on chronic resp failure: Uses 6 L of oxygen  Required BiPAP, but transition back to 6 L which is her baseline Covid-19 negative Placed on prednisone, inhalers, azithromycin Pulmonary critical care consulted-recommended palliative consult Palliative care met with patient and discussed with son Continue Trelegy upon discharge Prednisone 40 mg daily for 2 days and back to home dose 2 days of azithromycin upon discharge  Mediastinal LAD and pulmonary nodules Outpatient follow-up with pulmonary  Diabetes type 2 A1c 9.4% Continued Lantus   Chronic diastolic heart failure Euvolemic, compensated Continue Lasix  GERD Continue PPI  Leukocytosis Likely secondary to steroids Trended down while in hospital  Depression, anxiety Continue Paxil, Xanax  Goals of care Palliative care consulted Continue partial code, DNI  Physical deconditioning Multifactorial PT recommended home health HHA twice a week - cook some meals Gets meals on wheels   Diabetes:  Her sugars have been in the 200's.  She is taking her lantus at bedtime.  Her medication list states she is taking 32 units nightly, but she states she is only taking 22  units nightly.  She states she has been eating a lot of rice and really does not know how she should be eating with diabetes.  Acute on chronic respiratory failure, COPD exacerbation: She is tapering the prednisone.  She has completed the antibiotics.  She states her oxygen level varies-it does drop with activity, but at rest he can be 95 or 96%.  She does not vary her oxygen level depending on activity-she uses 6 L continuously.  Her shortness of breath is chronic and has not gotten worse since leaving the hospital.  She denies any significant cough, wheeze or fevers.  She is using the Trelegy daily.  GERD: She is taking her medication daily as prescribed.  She denies any heartburn.  Anxiety, depression: She is taking her medications as prescribed.  She feels her depression and anxiety are controlled with her current medications.   Review of Systems  Constitutional: Negative for chills and fever.  Respiratory: Positive for cough (Mild, chronic) and shortness of breath. Negative for wheezing.   Cardiovascular: Negative for chest pain, palpitations and leg swelling.  Gastrointestinal: Negative for abdominal pain, constipation, diarrhea and heartburn.  Neurological: Positive for headaches (occ). Negative for dizziness.     Social History   Socioeconomic History  . Marital status: Divorced    Spouse name: Not on file  . Number of children: 0  . Years of education: Not on file  . Highest education level: Not on file  Occupational History  . Occupation: retired  Scientific laboratory technician  . Financial resource strain: Not very hard  . Food insecurity  Worry: Never true    Inability: Never true  . Transportation needs    Medical: Yes    Non-medical: No  Tobacco Use  . Smoking status: Current Every Day Smoker    Packs/day: 0.25    Years: 50.00    Pack years: 12.50    Types: Cigarettes  . Smokeless tobacco: Never Used  . Tobacco comment: 3-4 cigaretters per day 11/20/2017   Substance and  Sexual Activity  . Alcohol use: Yes    Alcohol/week: 0.0 standard drinks    Comment: occasional  . Drug use: No  . Sexual activity: Not Currently  Lifestyle  . Physical activity    Days per week: 0 days    Minutes per session: 0 min  . Stress: To some extent  Relationships  . Social Herbalist on phone: Never    Gets together: Never    Attends religious service: Not on file    Active member of club or organization: Not on file    Attends meetings of clubs or organizations: Not on file    Relationship status: Divorced  Other Topics Concern  . Not on file  Social History Narrative   Married but recently separated from husband     Observations/Objective:   Lab Results  Component Value Date   WBC 11.2 (H) 11/06/2018   HGB 9.2 (L) 11/06/2018   HCT 36.0 11/06/2018   PLT 406 (H) 11/06/2018   GLUCOSE 184 (H) 11/06/2018   CHOL 232 (H) 07/01/2017   TRIG 233.0 (H) 07/01/2017   HDL 52.60 07/01/2017   LDLDIRECT 141.0 07/01/2017   LDLCALC 105 (H) 01/20/2015   ALT 16 11/03/2018   AST 16 11/03/2018   NA 140 11/06/2018   K 3.5 11/06/2018   CL 104 11/06/2018   CREATININE 0.85 11/06/2018   BUN 19 11/06/2018   CO2 28 11/06/2018   TSH 0.559 11/03/2018   HGBA1C 9.4 (H) 11/02/2018    DG Chest Port 1 View CLINICAL DATA:  Shortness of breath  EXAM: PORTABLE CHEST 1 VIEW  COMPARISON:  August 28, 2018  FINDINGS: Cardiomegaly is stable. Pulmonary vascularity is within normal limits. Consolidation is noted in the left base with suspected loculated pleural effusion. There is a smaller pleural effusion on the right. Interstitium is somewhat thickened diffusely, stable. No adenopathy evident. No bone lesions.  IMPRESSION: 1. New small right pleural effusion. Stable apparent loculated pleural effusion on the left.  2. Airspace opacity left base, stable. Question scarring versus pneumonia. Both entities may be present concurrently.  3.  Somewhat diffuse interstitial  thickening, stable.  4. Stable cardiomegaly. Pulmonary vascularity is stable and grossly normal.  Electronically Signed   By: Lowella Grip III M.D.   On: 11/02/2018 17:48    Assessment and Plan:  See Problem List for Assessment and Plan of chronic medical problems.   Follow Up Instructions:    I discussed the assessment and treatment plan with the patient. The patient was provided an opportunity to ask questions and all were answered. The patient agreed with the plan and demonstrated an understanding of the instructions.   The patient was advised to call back or seek an in-person evaluation if the symptoms worsen or if the condition fails to improve as anticipated.  Telephone call: Time spent on telephone call and reviewing hospital records and reviewing with patient 25 minutes  Binnie Rail, MD

## 2018-11-09 NOTE — Patient Outreach (Signed)
[facility] Care Management  11/09/2018  [place] Riddley 07-Dec-1945 [id number]   Subjective: Telephone call to patient's home  / mobile number, no answer, left HIPAA compliant voicemail message, and requested call back.   Objective:Per KPN (Knowledge Performance Now, point of care tool) and chart review,patient hospitalized 11/02/2018 - 11/06/2018 for COPD exacerbation,  Mediastinal LAD and pulmonary nodules, hospitalized 08/26/2018 - 09/05/2018 forAcute on chronic respiratory failure with hypoxia, COPD,Acute on chronic diastolic CHF (congestive heart failure). Patient also has a history of diabetes, hyperlipidemia, tobacco abuse, and OSA. Patient has been active with [facility] in the past for COPD complex case management.   Patient currently active with [name]. Has [name] for aide assistance. Lives alone. High risk for readmission. Limited support. Wears home 02 and has trilogy. Discharged from [facility] 09/25/2018.     Assessment:  Received Medicare [facility] Liaison referral on 11/06/2018. Referral source: Natividad Brood. Referral reason: community nurse for complex care and disease management follow up calls and assess for further needs. Per referral source Primary MD does Transition of care, patient may need more Personal Care Service hours but did not want to talk,  request that questions be left for when she gets home.  Screening  follow up pending patient contact.      Plan: RNCM will send unsuccessful outreach  letter, [facility] pamphlet, will call patient for 2nd telephone outreach attempt within 4 business days, screening follow up, and will proceed with case closure within 10 business days if no return call.     Caison Hearn H. [name], BSN, [facility] Telephonic CM Phone: [phone] Fax: [phone]

## 2018-11-10 ENCOUNTER — Telehealth: Payer: Self-pay

## 2018-11-10 ENCOUNTER — Ambulatory Visit (INDEPENDENT_AMBULATORY_CARE_PROVIDER_SITE_OTHER): Payer: Medicare Other | Admitting: Internal Medicine

## 2018-11-10 ENCOUNTER — Encounter: Payer: Self-pay | Admitting: Internal Medicine

## 2018-11-10 DIAGNOSIS — K219 Gastro-esophageal reflux disease without esophagitis: Secondary | ICD-10-CM

## 2018-11-10 DIAGNOSIS — R5381 Other malaise: Secondary | ICD-10-CM

## 2018-11-10 DIAGNOSIS — I5033 Acute on chronic diastolic (congestive) heart failure: Secondary | ICD-10-CM | POA: Diagnosis not present

## 2018-11-10 DIAGNOSIS — E119 Type 2 diabetes mellitus without complications: Secondary | ICD-10-CM

## 2018-11-10 DIAGNOSIS — Z794 Long term (current) use of insulin: Secondary | ICD-10-CM

## 2018-11-10 DIAGNOSIS — J9611 Chronic respiratory failure with hypoxia: Secondary | ICD-10-CM

## 2018-11-10 DIAGNOSIS — I110 Hypertensive heart disease with heart failure: Secondary | ICD-10-CM | POA: Diagnosis not present

## 2018-11-10 DIAGNOSIS — J9621 Acute and chronic respiratory failure with hypoxia: Secondary | ICD-10-CM

## 2018-11-10 DIAGNOSIS — F3289 Other specified depressive episodes: Secondary | ICD-10-CM | POA: Diagnosis not present

## 2018-11-10 DIAGNOSIS — J449 Chronic obstructive pulmonary disease, unspecified: Secondary | ICD-10-CM | POA: Diagnosis not present

## 2018-11-10 DIAGNOSIS — F419 Anxiety disorder, unspecified: Secondary | ICD-10-CM

## 2018-11-10 DIAGNOSIS — I959 Hypotension, unspecified: Secondary | ICD-10-CM | POA: Diagnosis not present

## 2018-11-10 DIAGNOSIS — E1165 Type 2 diabetes mellitus with hyperglycemia: Secondary | ICD-10-CM | POA: Diagnosis not present

## 2018-11-10 MED ORDER — LANTUS SOLOSTAR 100 UNIT/ML ~~LOC~~ SOPN: 25.0000 [IU] | PEN_INJECTOR | Freq: Every day | SUBCUTANEOUS | Status: DC

## 2018-11-10 NOTE — Telephone Encounter (Signed)
Yes, ok.  I did have a visit today and I increased her insulin dose.  Advised for her to call and we will adjust insulin over the next couple of weeks. Could use more education on diabetic diet.

## 2018-11-10 NOTE — Telephone Encounter (Signed)
[name] with [facility] sugar was 220 today. Ok to if they continue seeing her?

## 2018-11-10 NOTE — Assessment & Plan Note (Signed)
Controlled, stable Continue current dose of Paxil and alprazolam

## 2018-11-10 NOTE — Telephone Encounter (Signed)
Appt made.   Nothing further needed at this time.  

## 2018-11-10 NOTE — Assessment & Plan Note (Signed)
Continue home PT

## 2018-11-10 NOTE — Assessment & Plan Note (Addendum)
Taking lantus 22 units at night  - sugars running in 200's and recent a1c 9.4% Increase lantus to 25 units nightly Has not been as compliant with a diabetic diet -- reviewed diet and she will work on decreasing carbs/sugars Advised her to let me know in a week what her sugars are so we can further adjust her insulin

## 2018-11-10 NOTE — Assessment & Plan Note (Signed)
Controlled, stable Continue current dose of Paxil

## 2018-11-10 NOTE — Assessment & Plan Note (Signed)
Controlled Continue daily medication 

## 2018-11-10 NOTE — Assessment & Plan Note (Signed)
Improved, back to baseline Continue Trelegy daily Back to baseline prednisone dose Completed azithromycin On 6 L/min of oxygen continuously.  Oxygen does drop with activity Will follow up with pulmonary.

## 2018-11-11 ENCOUNTER — Ambulatory Visit: Payer: Self-pay | Admitting: *Deleted

## 2018-11-11 NOTE — Addendum Note (Signed)
Addended by: Conan Bowens on: 11/11/2018 09:28 PM   Modules accepted: Level of Service

## 2018-11-11 NOTE — Addendum Note (Signed)
Addended by: Conan Bowens on: 11/11/2018 09:31 PM   Modules accepted: Level of Service

## 2018-11-11 NOTE — Addendum Note (Signed)
Addended by: Conan Bowens on: 11/11/2018 09:26 PM   Modules accepted: Level of Service

## 2018-11-12 ENCOUNTER — Other Ambulatory Visit: Payer: Self-pay | Admitting: *Deleted

## 2018-11-12 NOTE — Patient Outreach (Signed)
[facility]) Care Management  11/12/2018  [name] 1945-07-26 [id number]   Subjective: Telephone call to patient's home  / mobile number times 3, no answer, line busy, and unable to leave a message.   Objective:Per KPN (Knowledge Performance Now, point of care tool) and chart review,patient hospitalized 11/02/2018 - 11/06/2018 for COPD exacerbation,  Mediastinal LAD and pulmonary nodules, hospitalized 08/26/2018 - 09/05/2018 forAcute on chronic respiratory failure with hypoxia, COPD,Acute on chronic diastolic CHF (congestive heart failure). Patient also has a history of diabetes, hyperlipidemia, tobacco abuse, and OSA. Patient has been active with [facility] in the past for COPD complex case management.   Patient currently active with [name]. Has [name] for aide assistance. Lives alone. High risk for readmission. Limited support. Wears home 02 and has trilogy. Discharged from [place] 09/25/2018.     Assessment: Received Medicare [facility] Liaison referral on 11/06/2018. Referral source: Natividad Brood. Referral reason: community nurse for complex care and disease management follow up calls and assess for further needs. Per referral source Primary MD does Transition of care, patient may need more Personal Care Service hours but did not want to talk,  request that questions be left for when she gets home.  Screening follow up pending patient contact.      Plan: RNCM has sent unsuccessful outreach  letter, [facility] pamphlet, will call patient for 3rd telephone outreach attempt within 4 business days, screening follow up, and will proceed with case closure within 10 business days if no return call.     Jeanluc Wegman H. [name], BSN, [place] Management [facility] Telephonic CM Phone: [phone] Fax: [phone]

## 2018-11-13 ENCOUNTER — Inpatient Hospital Stay: Payer: Medicare Other | Admitting: Pulmonary Disease

## 2018-11-13 ENCOUNTER — Ambulatory Visit (INDEPENDENT_AMBULATORY_CARE_PROVIDER_SITE_OTHER): Payer: Medicare Other | Admitting: Pulmonary Disease

## 2018-11-13 ENCOUNTER — Other Ambulatory Visit: Payer: Self-pay | Admitting: *Deleted

## 2018-11-13 ENCOUNTER — Telehealth: Payer: Self-pay | Admitting: Pulmonary Disease

## 2018-11-13 ENCOUNTER — Encounter: Payer: Self-pay | Admitting: Pulmonary Disease

## 2018-11-13 DIAGNOSIS — Z7189 Other specified counseling: Secondary | ICD-10-CM | POA: Insufficient documentation

## 2018-11-13 DIAGNOSIS — J9621 Acute and chronic respiratory failure with hypoxia: Secondary | ICD-10-CM

## 2018-11-13 DIAGNOSIS — J441 Chronic obstructive pulmonary disease with (acute) exacerbation: Secondary | ICD-10-CM

## 2018-11-13 DIAGNOSIS — Z515 Encounter for palliative care: Secondary | ICD-10-CM

## 2018-11-13 DIAGNOSIS — J9612 Chronic respiratory failure with hypercapnia: Secondary | ICD-10-CM | POA: Diagnosis not present

## 2018-11-13 DIAGNOSIS — J9611 Chronic respiratory failure with hypoxia: Secondary | ICD-10-CM

## 2018-11-13 DIAGNOSIS — R5381 Other malaise: Secondary | ICD-10-CM

## 2018-11-13 DIAGNOSIS — Z720 Tobacco use: Secondary | ICD-10-CM

## 2018-11-13 NOTE — Assessment & Plan Note (Signed)
Discussed. Pt will remain a DNR.

## 2018-11-13 NOTE — Assessment & Plan Note (Signed)
Resolved

## 2018-11-13 NOTE — Assessment & Plan Note (Signed)
Plan:  We recommend you stop smoking

## 2018-11-13 NOTE — Patient Instructions (Addendum)
You were seen today by Lauraine Rinne, NP  for:   1. Chronic respiratory failure with hypoxia and hypercapnia (HCC)  Continue oxygen therapy as prescribed - 6L as prescribed  >>>maintain oxygen saturations greater than 88 percent  >>>if unable to maintain oxygen saturations please contact the office  >>>do not smoke with oxygen  >>>can use nasal saline gel or nasal saline rinses to moisturize nose if oxygen causes dryness  - Ambulatory referral to Hospice  2. Acute on chronic respiratory failure with hypoxia (HCC)  Resolving   3. Palliative care by specialist  - Ambulatory referral to Hospice  4. Physical deconditioning  Continue to increase your physical activity   Continue Home PT  5. Tobacco abuse  We recommend that you stop smoking.  >>>You need to set a quit date >>>If you have friends or family who smoke, let them know you are trying to quit and not to smoke around you or in your living environment  Smoking Cessation Resources:  1 800 QUIT NOW  >>> Patient to call this resource and utilize it to help support her quit smoking >>> Keep up your hard work with stopping smoking  You can also contact the [facility] >>>For smoking cessation classes call [phone]  We do not recommend using e-cigarettes as a form of stopping smoking  You can sign up for smoking cessation support texts and information:  >>>https://smokefree.gov/smokefreetxt   6. DNR (do not resuscitate) discussion  - Ambulatory referral to Hospice  7. Chronic obstructive pulmonary disease with acute exacerbation (HCC)  Trelegy Ellipta  >>> 1 puff daily in the morning >>>rinse mouth out after use  >>> This inhaler contains 3 medications that help manage her respiratory status, contact our office if you cannot afford this medication or unable to remain on this medication  Note your daily symptoms > remember "red flags" for COPD:   >>>Increase in cough >>>increase in sputum  production >>>increase in shortness of breath or activity  intolerance.   If you notice these symptoms, please call the office to be seen.    8. COPD exacerbation ([place])  Resolving   9. Complex care coordination  Contact [name]: Sonda Rumble RN [phone] Referral to Hospice    We recommend today:  Orders Placed This Encounter  Procedures  . Ambulatory referral to Hospice    Referral Priority:   Routine    Referral Type:   Consultation    Referral Reason:   Specialty Services Required    Requested Specialty:   Hospice Services    Number of Visits Requested:   1   Orders Placed This Encounter  Procedures  . Ambulatory referral to Hospice   No orders of the defined types were placed in this encounter.   Follow Up:    Return in about 4 weeks (around 12/11/2018), or if symptoms worsen or fail to improve, for Follow up with Dr. Purnell Shoemaker.    Please do your part to reduce the spread of COVID-19:      Reduce your risk of any infection  and COVID19 by using the similar precautions used for avoiding the common cold or flu:  Marland Kitchen Wash your hands often with soap and warm water for at least 20 seconds.  If soap and water are not readily available, use an alcohol-based hand sanitizer with at least 60% alcohol.  . If coughing or sneezing, cover your mouth and nose by coughing or sneezing into the elbow areas of your shirt or  coat, into a tissue or into your sleeve (not your hands). Langley Gauss A MASK when in public  . Avoid shaking hands with others and consider head nods or verbal greetings only. . Avoid touching your eyes, nose, or mouth with unwashed hands.  . Avoid close contact with people who are sick. . Avoid places or events with large numbers of people in one location, like concerts or sporting events. . If you have some symptoms but not all symptoms, continue to monitor at home and seek medical attention if your symptoms worsen. . If you are having a medical emergency,  call 911.   [place] / e-Visit: [email or web address]         [facility]: [facility]: [id number]                   [facility]: [id number]     It is flu season:   >>> Best ways to protect herself from the flu: Receive the yearly flu vaccine, practice good hand hygiene washing with soap and also using hand sanitizer when available, eat a nutritious meals, get adequate rest, hydrate appropriately   Please contact the office if your symptoms worsen or you have concerns that you are not improving.   Thank you for choosing [facility] for your healthcare, and for allowing [place] to partner with you on your healthcare journey. I am thankful to be able to provide care to you today.   Wyn Quaker FNP-C

## 2018-11-13 NOTE — Assessment & Plan Note (Signed)
Plan:  Continue home pt

## 2018-11-13 NOTE — Assessment & Plan Note (Addendum)
Finished pred taper  Finished azithromycin  Plan:  Continue Trelegy Ellipta  We recommended stopping smoking  Continue oxygen therapy  Establish with hospice  Establish with [facility]

## 2018-11-13 NOTE — Assessment & Plan Note (Signed)
Plan:  Establish with hospice for management  Establish with [facility]

## 2018-11-13 NOTE — Telephone Encounter (Signed)
Spoke with patient. She stated that she found the number for Darlene. Nothing further needed at time of call.

## 2018-11-13 NOTE — Progress Notes (Signed)
Virtual Visit via Telephone Note  I connected with [name] on 11/13/18 at 11:30 AM EDT by telephone and verified that I am speaking with the correct person using two identifiers.  Location: Patient: Home Provider: Office [facility] - [id number], [place], [place], [place] [address]   I discussed the limitations, risks, security and privacy concerns of performing an evaluation and management service by telephone and the availability of in person appointments. I also discussed with the patient that there may be a patient responsible charge related to this service. The patient expressed understanding and agreed to proceed.  Patient consented to consult via telephone: Yes People present and their role in pt care: Pt   History of Present Illness:  73 year old female current every day smoker followed in our office for COPD and chronic respiratory failure  PMH: Hyperlipidemia, anxiety, diabetes, fatigue, gerd, pulmonary htn,  Smoking Assessment: Current everyday smoker. 0.25 ppd. 12.5 pack years.  Maintenance: Trelegy Ellipta Pt of Dr. Purnell Shoemaker   Chief complaint: HFU follow up    73 year old female current every day smoker followed in our office for COPD and chronic respiratory failure.  Patient was recently hospitalized on 11/02/2018 and discharged on 11/06/2018.  Patient has completed her prednisone taper as well as her azithromycin.  She is completed follow-up with primary care.  She feels that she is back to her baseline.  Per hospital discharge notes patient refused home hospice.  Patient continues to be maintained on palliative.  She reports the reasons why she refused hospice was because she was told that she would not be able to maintain any of her medications, she was concerned of cost, and she would not be able to keep her outpatient medical providers.  We will discuss this in clear up any confusion today.  Patient admits that she does feel like she is still having  shortness of breath and feels fatigued.  She feels that this is her baseline.  Chart Review:  11/02/2018-hospitalization-COPD exacerbation, acute on chronic hypoxic respiratory failure with hypercarbia >>> Required BiPAP Discharge date: 11/06/2018 Plan: Prednisone taper, then resume home prednisone dose, continue Trelegy Ellipta at discharge, continue azithromycin, home physical therapy and home health recommended, patient not willing to transition to hospice, consider NIV ventilator if patient has repeat admission  Observations/Objective:  11/02/2018-SARS-CoV-2-negative  08/27/2018-echocardiogram- LV ejection fraction 60 to 65%, increased left ventricular wall thickness, impaired relaxation, RV has normal systolic function, cavity was normal, mild diastolic dysfunction  [id number] function test- FVC 1.46 (64% predicted), FEV1 0.69 (39% predicted), ratio 47  10/30/2016-CT chest with contrast- stable appearance of mediastinal adenopathy, bilateral hilar adenopathy is identified, small nonspecific pulmonary nodules are unchanged from previous exam, emphysema  Assessment and Plan:  Acute on chronic respiratory failure with hypoxia (HCC) Improving, back to baseline    Chronic respiratory failure ([place]) Plan:  Continue home oxygen -6L continuous  Maintain oxygen saturations above 88 percent   COPD (chronic obstructive pulmonary disease) (HCC) Finished pred taper  Finished azithromycin  Plan:  Continue Trelegy Ellipta  We recommended stopping smoking  Continue oxygen therapy  Establish with hospice  Establish with [name]     COPD exacerbation ([place]) Resolved   Complex care coordination Plan:  Establish with hospice for management  Establish with [facility]   DNR (do not resuscitate) discussion Discussed. Pt will remain a DNR.   Palliative care by specialist Plan:  Referral to Hospice   Physical deconditioning Plan:  Continue home pt   Tobacco abuse Plan:  We  recommend you stop smoking   Follow Up Instructions:  Return in about 4 weeks (around 12/11/2018), or if symptoms worsen or fail to improve, for Follow up with Dr. Purnell Shoemaker.   I discussed the assessment and treatment plan with the patient. The patient was provided an opportunity to ask questions and all were answered. The patient agreed with the plan and demonstrated an understanding of the instructions.   The patient was advised to call back or seek an in-person evaluation if the symptoms worsen or if the condition fails to improve as anticipated.  I provided 45 minutes of non-face-to-face time during this encounter.  This time also includes telephone time discussing with hospice RN that patient will establish care.   Lauraine Rinne, NP

## 2018-11-13 NOTE — Assessment & Plan Note (Signed)
Plan:  Continue home oxygen -6L continuous  Maintain oxygen saturations above 88 percent

## 2018-11-13 NOTE — Assessment & Plan Note (Signed)
Improving, back to baseline

## 2018-11-13 NOTE — Assessment & Plan Note (Signed)
Plan:  Referral to Hospice

## 2018-11-13 NOTE — Patient Outreach (Signed)
[facility]) Care Management  11/13/2018  [name] Nov 08, 1945 [id number]   Subjective: Telephone call to patient's home  / mobile number, no answer, left HIPAA compliant voicemail message, and requested call back.    Objective:Per KPN (Knowledge Performance Now, point of care tool) and chart review,patient hospitalized10/12/2018 - 11/06/2018 for COPD exacerbation,Mediastinal LAD and pulmonary nodules, hospitalized8/05/2018 - 09/05/2018 forAcute on chronic respiratory failure with hypoxia, COPD,Acute on chronic diastolic CHF (congestive heart failure). Patient also has a history of diabetes, hyperlipidemia, tobacco abuse, and OSA. Patient has been active with [place] [facility] [facility] in the past for COPDcomplex case management. Patient currently active with [name]. Has [name] for aide assistance. Lives alone. High risk for readmission. Limited support. Wears home 02 and has trilogy. Discharged from [place] 09/25/2018.     Assessment: Received Medicare [place] on10/16/2020. Referral source:Victoria Brewer. Referral reason:community nurse for complex care and disease management follow up calls and assess for further needs. Per referral source Primary MDdoes Transition of care, patientmay need more PersonalCareServicehours but did notwant to talk,request that questions be left for when she gets home. Screening follow up pending patient contact.     Plan:RNCM has sent unsuccessful outreach letter, [facility] pamphlet, will proceed with case closure within 10 business days if no return call.      [place] H. [name], BSN, [place] Management [facility] Telephonic CM Phone: [phone] Fax: [phone]

## 2018-11-15 ENCOUNTER — Telehealth: Payer: Self-pay | Admitting: Internal Medicine

## 2018-11-15 MED ORDER — PREDNISONE 20 MG PO TABS: 20.0000 mg | ORAL_TABLET | Freq: Every day | ORAL | 0 refills | Status: DC

## 2018-11-15 NOTE — Telephone Encounter (Signed)
req refill on pred 20 mg daily says this is her baseline   rec # 15 only sent in, no refills and discuss longterm refill with Dr MR on 11/30/18 as planned

## 2018-11-16 ENCOUNTER — Telehealth: Payer: Self-pay | Admitting: Internal Medicine

## 2018-11-16 NOTE — Telephone Encounter (Signed)
Merry Proud called from [facility] requesting verbal orders for home health PT  For   2x 2weeks Call back [phone]

## 2018-11-17 DIAGNOSIS — J449 Chronic obstructive pulmonary disease, unspecified: Secondary | ICD-10-CM | POA: Diagnosis not present

## 2018-11-17 DIAGNOSIS — I959 Hypotension, unspecified: Secondary | ICD-10-CM | POA: Diagnosis not present

## 2018-11-17 DIAGNOSIS — I110 Hypertensive heart disease with heart failure: Secondary | ICD-10-CM | POA: Diagnosis not present

## 2018-11-17 DIAGNOSIS — E1165 Type 2 diabetes mellitus with hyperglycemia: Secondary | ICD-10-CM | POA: Diagnosis not present

## 2018-11-17 DIAGNOSIS — J9621 Acute and chronic respiratory failure with hypoxia: Secondary | ICD-10-CM | POA: Diagnosis not present

## 2018-11-17 DIAGNOSIS — I5033 Acute on chronic diastolic (congestive) heart failure: Secondary | ICD-10-CM | POA: Diagnosis not present

## 2018-11-17 NOTE — Telephone Encounter (Signed)
Gave ok for orders per Dr. Burns.  

## 2018-11-18 DIAGNOSIS — E669 Obesity, unspecified: Secondary | ICD-10-CM

## 2018-11-18 DIAGNOSIS — G4733 Obstructive sleep apnea (adult) (pediatric): Secondary | ICD-10-CM

## 2018-11-18 DIAGNOSIS — F17210 Nicotine dependence, cigarettes, uncomplicated: Secondary | ICD-10-CM

## 2018-11-18 DIAGNOSIS — Z6836 Body mass index (BMI) 36.0-36.9, adult: Secondary | ICD-10-CM

## 2018-11-18 DIAGNOSIS — J9621 Acute and chronic respiratory failure with hypoxia: Secondary | ICD-10-CM

## 2018-11-18 DIAGNOSIS — E876 Hypokalemia: Secondary | ICD-10-CM

## 2018-11-18 DIAGNOSIS — Z9981 Dependence on supplemental oxygen: Secondary | ICD-10-CM

## 2018-11-18 DIAGNOSIS — Z794 Long term (current) use of insulin: Secondary | ICD-10-CM

## 2018-11-18 DIAGNOSIS — Z7952 Long term (current) use of systemic steroids: Secondary | ICD-10-CM

## 2018-11-18 DIAGNOSIS — E1165 Type 2 diabetes mellitus with hyperglycemia: Secondary | ICD-10-CM | POA: Diagnosis not present

## 2018-11-18 DIAGNOSIS — F329 Major depressive disorder, single episode, unspecified: Secondary | ICD-10-CM | POA: Diagnosis not present

## 2018-11-18 DIAGNOSIS — F419 Anxiety disorder, unspecified: Secondary | ICD-10-CM | POA: Diagnosis not present

## 2018-11-18 DIAGNOSIS — E7849 Other hyperlipidemia: Secondary | ICD-10-CM

## 2018-11-18 DIAGNOSIS — Z9181 History of falling: Secondary | ICD-10-CM

## 2018-11-18 DIAGNOSIS — I959 Hypotension, unspecified: Secondary | ICD-10-CM

## 2018-11-18 DIAGNOSIS — K219 Gastro-esophageal reflux disease without esophagitis: Secondary | ICD-10-CM | POA: Diagnosis not present

## 2018-11-18 DIAGNOSIS — J449 Chronic obstructive pulmonary disease, unspecified: Secondary | ICD-10-CM | POA: Diagnosis not present

## 2018-11-18 DIAGNOSIS — Z7951 Long term (current) use of inhaled steroids: Secondary | ICD-10-CM

## 2018-11-18 DIAGNOSIS — I110 Hypertensive heart disease with heart failure: Secondary | ICD-10-CM | POA: Diagnosis not present

## 2018-11-18 DIAGNOSIS — I5033 Acute on chronic diastolic (congestive) heart failure: Secondary | ICD-10-CM

## 2018-11-20 ENCOUNTER — Telehealth: Payer: Self-pay | Admitting: Internal Medicine

## 2018-11-20 DIAGNOSIS — E1165 Type 2 diabetes mellitus with hyperglycemia: Secondary | ICD-10-CM | POA: Diagnosis not present

## 2018-11-20 DIAGNOSIS — J449 Chronic obstructive pulmonary disease, unspecified: Secondary | ICD-10-CM | POA: Diagnosis not present

## 2018-11-20 DIAGNOSIS — I110 Hypertensive heart disease with heart failure: Secondary | ICD-10-CM | POA: Diagnosis not present

## 2018-11-20 DIAGNOSIS — I5033 Acute on chronic diastolic (congestive) heart failure: Secondary | ICD-10-CM | POA: Diagnosis not present

## 2018-11-20 DIAGNOSIS — I959 Hypotension, unspecified: Secondary | ICD-10-CM | POA: Diagnosis not present

## 2018-11-20 DIAGNOSIS — J9621 Acute and chronic respiratory failure with hypoxia: Secondary | ICD-10-CM | POA: Diagnosis not present

## 2018-11-20 NOTE — Telephone Encounter (Signed)
Noted  

## 2018-11-20 NOTE — Telephone Encounter (Signed)
Home Health Verbal Orders - Caller/Agency: Tray/ with [facility]  Callback Number: [phone] Requesting OT/PT/Skilled Nursing/Social Work/Speech Therapy: pt had missed visit today.  She had something come up.

## 2018-11-23 ENCOUNTER — Other Ambulatory Visit: Payer: Self-pay | Admitting: *Deleted

## 2018-11-23 ENCOUNTER — Telehealth: Payer: Self-pay | Admitting: Licensed Clinical Social Worker

## 2018-11-23 NOTE — Telephone Encounter (Signed)
Per the request of Palliative Care RN, Daryl Eastern, SW left a vm with patient to assess her status.

## 2018-11-23 NOTE — Patient Outreach (Addendum)
[name]) Care Management  11/23/2018  [name] [birth year] [id number]   Subjective:  Received voicemail message from [facility], states she is returning call, and requested call back. Telephone call to patient's home / mobile number, spoke with patient, and HIPAA verified.  Discussed [facility] Liaison COPD complex case management referral follow up, patient voiced understanding, and is in agreement to follow up.    Patient states she remembers speaking with this RNCM in the past, wishes to re-enroll in the program at a later time, and requested call back.   Objective:Per KPN (Knowledge Performance Now, point of care tool) and chart review,patient hospitalized10/12/2018 - 11/06/2018 for COPD exacerbation,Mediastinal LAD and pulmonary nodules, hospitalized8/05/2018 - 09/05/2018 forAcute on chronic respiratory failure with hypoxia, COPD,Acute on chronic diastolic CHF (congestive heart failure). Patient also has a history of diabetes, hyperlipidemia, tobacco abuse, and OSA. Patient has been active with [facility] in the past for COPDcomplex case management. Patient currently active with [name]. Has [name] for aide assistance. Lives alone. High risk for readmission. Limited support. Wears home 02 and has trilogy. Discharged from [place] 09/25/2018.     Assessment: Received Medicare [place] on10/16/2020. Referral source:[name]. Referral reason:community nurse for complex care and disease management follow up calls and assess for further needs. Per referral source Primary MDdoes Transition of care, patientmay need more PersonalCareServicehours but did notwant to talk,request that questions be left for when she gets home. Screening follow up pending patient contact.     Plan:RNCMhassentunsuccessful  outreach letter, [facility] [name] pamphlet, will make 1 additional outreach attempt within 4 business days, screening follow up / initial assessment completion, proceed with case closure within 10 business days if no return call.      [name] H. [name], BSN, [place] Management [facility] Telephonic CM Phone: [phone] Fax: [phone]

## 2018-11-24 ENCOUNTER — Telehealth: Payer: Self-pay | Admitting: Internal Medicine

## 2018-11-24 DIAGNOSIS — I959 Hypotension, unspecified: Secondary | ICD-10-CM | POA: Diagnosis not present

## 2018-11-24 DIAGNOSIS — E1165 Type 2 diabetes mellitus with hyperglycemia: Secondary | ICD-10-CM | POA: Diagnosis not present

## 2018-11-24 DIAGNOSIS — I110 Hypertensive heart disease with heart failure: Secondary | ICD-10-CM | POA: Diagnosis not present

## 2018-11-24 DIAGNOSIS — J9621 Acute and chronic respiratory failure with hypoxia: Secondary | ICD-10-CM | POA: Diagnosis not present

## 2018-11-24 DIAGNOSIS — J449 Chronic obstructive pulmonary disease, unspecified: Secondary | ICD-10-CM | POA: Diagnosis not present

## 2018-11-24 DIAGNOSIS — I5033 Acute on chronic diastolic (congestive) heart failure: Secondary | ICD-10-CM | POA: Diagnosis not present

## 2018-11-24 NOTE — Telephone Encounter (Signed)
Home Health Verbal Orders - Caller/Agency: [facility] Callback Number: [phone] secure line   Requesting OT/PT/Skilled Nursing/Social Work/Speech Therapy: PT Frequency: 1wk for 4x and one every other for 4x.

## 2018-11-24 NOTE — Telephone Encounter (Signed)
Home Health Verbal Orders - Caller/Agency: Levada Dy [place] Number: [phone] Requesting OT/PT/Skilled Nursing/Social Work/Speech Therapy: Nursing re-certification  Frequency: 1 week 9

## 2018-11-24 NOTE — Telephone Encounter (Signed)
Gave ok for orders per Dr. Burns.  

## 2018-11-25 ENCOUNTER — Other Ambulatory Visit: Payer: Self-pay | Admitting: *Deleted

## 2018-11-25 ENCOUNTER — Telehealth: Payer: Self-pay | Admitting: Internal Medicine

## 2018-11-25 ENCOUNTER — Encounter: Payer: Self-pay | Admitting: *Deleted

## 2018-11-25 DIAGNOSIS — F17210 Nicotine dependence, cigarettes, uncomplicated: Secondary | ICD-10-CM | POA: Diagnosis not present

## 2018-11-25 DIAGNOSIS — Z6836 Body mass index (BMI) 36.0-36.9, adult: Secondary | ICD-10-CM | POA: Diagnosis not present

## 2018-11-25 DIAGNOSIS — Z7951 Long term (current) use of inhaled steroids: Secondary | ICD-10-CM | POA: Diagnosis not present

## 2018-11-25 DIAGNOSIS — E669 Obesity, unspecified: Secondary | ICD-10-CM | POA: Diagnosis not present

## 2018-11-25 DIAGNOSIS — I959 Hypotension, unspecified: Secondary | ICD-10-CM | POA: Diagnosis not present

## 2018-11-25 DIAGNOSIS — E1165 Type 2 diabetes mellitus with hyperglycemia: Secondary | ICD-10-CM | POA: Diagnosis not present

## 2018-11-25 DIAGNOSIS — F329 Major depressive disorder, single episode, unspecified: Secondary | ICD-10-CM | POA: Diagnosis not present

## 2018-11-25 DIAGNOSIS — Z9981 Dependence on supplemental oxygen: Secondary | ICD-10-CM | POA: Diagnosis not present

## 2018-11-25 DIAGNOSIS — J9621 Acute and chronic respiratory failure with hypoxia: Secondary | ICD-10-CM | POA: Diagnosis not present

## 2018-11-25 DIAGNOSIS — J449 Chronic obstructive pulmonary disease, unspecified: Secondary | ICD-10-CM | POA: Diagnosis not present

## 2018-11-25 DIAGNOSIS — E876 Hypokalemia: Secondary | ICD-10-CM | POA: Diagnosis not present

## 2018-11-25 DIAGNOSIS — E7849 Other hyperlipidemia: Secondary | ICD-10-CM | POA: Diagnosis not present

## 2018-11-25 DIAGNOSIS — G4733 Obstructive sleep apnea (adult) (pediatric): Secondary | ICD-10-CM | POA: Diagnosis not present

## 2018-11-25 DIAGNOSIS — I5033 Acute on chronic diastolic (congestive) heart failure: Secondary | ICD-10-CM | POA: Diagnosis not present

## 2018-11-25 DIAGNOSIS — K219 Gastro-esophageal reflux disease without esophagitis: Secondary | ICD-10-CM | POA: Diagnosis not present

## 2018-11-25 DIAGNOSIS — Z794 Long term (current) use of insulin: Secondary | ICD-10-CM | POA: Diagnosis not present

## 2018-11-25 DIAGNOSIS — I110 Hypertensive heart disease with heart failure: Secondary | ICD-10-CM | POA: Diagnosis not present

## 2018-11-25 DIAGNOSIS — Z9181 History of falling: Secondary | ICD-10-CM | POA: Diagnosis not present

## 2018-11-25 DIAGNOSIS — Z7952 Long term (current) use of systemic steroids: Secondary | ICD-10-CM | POA: Diagnosis not present

## 2018-11-25 DIAGNOSIS — F419 Anxiety disorder, unspecified: Secondary | ICD-10-CM | POA: Diagnosis not present

## 2018-11-25 NOTE — Telephone Encounter (Signed)
Spoke with pt, she states she takes prednisone everyday and needs a refill. She does have an appt on Tuesday and doesn't have enough to last until then. She stated the pharmacy denied the refill request. Can we send in another refill for the prednisone? Please advise.

## 2018-11-25 NOTE — Telephone Encounter (Signed)
[name] and MR

## 2018-11-25 NOTE — Telephone Encounter (Signed)
OK noted.   Wyn Quaker FNP

## 2018-11-26 NOTE — Patient Outreach (Signed)
[name]  11/26/2018  [name] 1945-09-16 [id number]   Late entry for 11/25/2018 at 1631:  Subjective: Telephone call to patient's home / mobile number, spoke with patient, and HIPAA verified. Discussed [facility] Liaison COPD complex case [name] referral follow up, patient voiced understanding, and is in agreement to follow up.    Patient states she remembers speaking with this RNCM in the past, wishes to re-enroll in the program at a this time.  Discussed criteria for patient engagement and program participation, patient voices understanding, is aware of previous program dis-enrollment due to unable to reach, and is in agreement to participate. States she will answer the phone when receiving call from [name]  provider, return phone calls to [name] going forward if she is not available, and has requested to receive calls after 9:00am.  States she is feeling okay, had follow up visit with primary MD, and appointment went well.  Patient states the [place] [name] assessments are lengthy, aware program is voluntary, and  is requesting to participate.   Patient states she is receiving home health services and services going well.   States she is also receiving aide services provided through DSS for 2 days per week, would like aide hours / days to to be increased, in agreement to a referral to [name].  Patient states she iis able to manage self care and has assistance as needed.  States she is using walker for ambulation and conservation strategies to increase mobility. Depression screening completed,  discussed results, patient voices understanding states she is in agreement with [name] referral to follow up on screening results, notify primary MD of results, and provide community behavioral health resources.  States  she is unable obtain new glasses, in agreement to referral to [name].   Patient states she does not have reliable transportation and patient in agreement to a referral to [name].    Patient states she can not afford glucerna, needs assistance, and RNCM will follow up to obtain local community resources.  Patient states she does not have any education material, transition of care, care coordination, disease [name], disease monitoring, transportation, community resource, or pharmacy needs at this time.   Objective:Per KPN (Knowledge Performance Now, point of care tool) and chart review,patient hospitalized10/12/2018 - 11/06/2018 for COPD exacerbation,Mediastinal LAD and pulmonary nodules, hospitalized8/05/2018 - 09/05/2018 forAcute on chronic respiratory failure with hypoxia, COPD,Acute on chronic diastolic CHF (congestive heart failure). Patient also has a history of diabetes, hyperlipidemia, tobacco abuse, and OSA. Patient has been active with [place] [name] in the past for COPDcomplex case [name]. Patient currently active with [name]. Has [name] for aide assistance. Lives alone. High risk for readmission. Limited support. Wears home 02 and has trilogy. Discharged from [place] 09/25/2018.     Assessment: Received Medicare [name] on10/16/2020. Referral source:[name]. Referral reason:community nurse for complex care and disease [name] follow up calls and assess for further needs. Per referral source Primary MDdoes Transition of care, patientmay need more PersonalCareServicehours but did notwant to talk,request that questions be left for when she gets home. Screening follow up completed and will follow patient for short term  COPD case [name].      Plan:RNCMwill send patient welcome letter packet with consent, outreach follow up call  within 10 business days, initial assessment completion, proceed with case closure within 10 business days if no return call.      [name]. [name], BSN, [place] [name] [facility] Telephonic CM Phone: [phone] Fax: [phone]

## 2018-11-27 ENCOUNTER — Other Ambulatory Visit: Payer: Medicare Other | Admitting: *Deleted

## 2018-11-27 ENCOUNTER — Other Ambulatory Visit: Payer: Self-pay

## 2018-11-27 DIAGNOSIS — Z515 Encounter for palliative care: Secondary | ICD-10-CM

## 2018-11-27 MED ORDER — PREDNISONE 20 MG PO TABS: 20.0000 mg | ORAL_TABLET | Freq: Every day | ORAL | 11 refills | Status: DC

## 2018-11-27 NOTE — Telephone Encounter (Signed)
Pt returning call

## 2018-11-27 NOTE — Telephone Encounter (Signed)
lmtcb for pt. rx has been sent to pharmacy as requested.

## 2018-11-27 NOTE — Telephone Encounter (Signed)
ATC pt, no answer. Left message for pt to call back.  

## 2018-11-27 NOTE — Telephone Encounter (Signed)
Yes send her enough prednisone with refills for a full year  Thanks  Mr

## 2018-11-30 ENCOUNTER — Telehealth: Payer: Self-pay | Admitting: Pulmonary Disease

## 2018-11-30 ENCOUNTER — Ambulatory Visit: Payer: Medicare Other | Admitting: Internal Medicine

## 2018-11-30 NOTE — Telephone Encounter (Signed)
Call returned to patient, confirmed DOB, covid screen negative. Confirms chronic SOB and cough. Aware to keep appt. Nothing further needed at this time.

## 2018-11-30 NOTE — Progress Notes (Signed)
COMMUNITY PALLIATIVE CARE RN NOTE  PATIENT NAME: [name] DOB: 12/24/1945 MRN: [id number]  PRIMARY CARE PROVIDER: Binnie Rail, MD  RESPONSIBLE PARTY:  Acct ID - Guarantor Home Phone Work Phone Relationship Acct Type  [id number] [facility] [phone]  Self P/F     [address], [place], [place], [place] [address]   Due to the COVID-19 crisis, this virtual check-in visit was done via telephone from my office and it was initiated and consent by this patient and or family.  PLAN OF CARE and INTERVENTION:  1. ADVANCE CARE PLANNING/GOALS OF CARE: Goal is for patient to remain in her home. She is a Full code. 2. PATIENT/CAREGIVER EDUCATION: Dyspnea Management 3. DISEASE STATUS: Virtual check-in visit completed via telephone. Patient is pleasant and engaging. She denies pain. She does experience dyspnea with exertion, and states that she tends to have more shortness of breath in the mornings when she first wakes up, but seems to smooth out as the day progresses. She remains on oxygen at 6L/min via [place]. She says that her sats have been ranging in the 80s to 90s, and have not dropped below this over the past week. She says that she is going to start using her nebulizer more regularly, as they seem to help her. She does have an occasional, productive cough. Phlegm is clear or white in appearance. She states she is taking all of her medications as prescribed. She had a session with physical therapy last week, but says that the therapist had to call her PCP to request an order for additional visits. She is awaiting return call on this matter. She remains ambulatory using her walker. She has been able to prepare small meals and make coffee for herself. She continues with a home health aide twice weekly to assist her with personal care and household chores. Her appetite is good. She denies dysphagia. She was recently evaluated for hospice care. She does qualify for hospice services, however she refused them  at this time because she says if she goes with hospice, she will lose her home health aide services and she does not want to give them up at this time. She has an appointment with [facility] on 12/01/18. Will continue to monitor.    HISTORY OF PRESENT ILLNESS:  [name] who resides at home alone. Palliative care team continue to follow patient. Will continue to visit monthly and PRN.  CODE STATUS: Full Code ADVANCED DIRECTIVES: N MOST FORM: yes PPS: 50%   (Duration of visit and documentation 45 minutes   Daryl Eastern, RN BSN

## 2018-11-30 NOTE — Telephone Encounter (Signed)
LMTCB x2 for pt 

## 2018-12-01 ENCOUNTER — Other Ambulatory Visit: Payer: Self-pay

## 2018-12-01 ENCOUNTER — Telehealth: Payer: Self-pay | Admitting: Pulmonary Disease

## 2018-12-01 ENCOUNTER — Encounter: Payer: Self-pay | Admitting: Pulmonary Disease

## 2018-12-01 ENCOUNTER — Ambulatory Visit (INDEPENDENT_AMBULATORY_CARE_PROVIDER_SITE_OTHER): Payer: Medicare Other | Admitting: Pulmonary Disease

## 2018-12-01 ENCOUNTER — Other Ambulatory Visit: Payer: Medicare Other | Admitting: *Deleted

## 2018-12-01 DIAGNOSIS — J441 Chronic obstructive pulmonary disease with (acute) exacerbation: Secondary | ICD-10-CM

## 2018-12-01 DIAGNOSIS — F17210 Nicotine dependence, cigarettes, uncomplicated: Secondary | ICD-10-CM | POA: Diagnosis not present

## 2018-12-01 DIAGNOSIS — Z720 Tobacco use: Secondary | ICD-10-CM

## 2018-12-01 DIAGNOSIS — J9611 Chronic respiratory failure with hypoxia: Secondary | ICD-10-CM

## 2018-12-01 DIAGNOSIS — Z515 Encounter for palliative care: Secondary | ICD-10-CM

## 2018-12-01 MED ORDER — AZITHROMYCIN 250 MG PO TABS: ORAL_TABLET | ORAL | 0 refills | Status: DC

## 2018-12-01 NOTE — Assessment & Plan Note (Signed)
  Plan:  Increase prednisone to 40 mg daily for 7 days, then transition back to 20 mg daily Z-Pak today Continue Trelegy Ellipta  We recommended stopping smoking  Continue oxygen therapy  Establish with hospice  Establish with [facility]

## 2018-12-01 NOTE — Telephone Encounter (Signed)
Attempted to call pt but unable to reach. Left message for pt to return call. Due to multiple attempts trying to reach pt and not being able to do so, per triage protocol encounter will be closed.

## 2018-12-01 NOTE — Assessment & Plan Note (Signed)
Plan: Maintain oxygen saturations greater than 88% Order placed to adapt patient see me for a concentrator with a capacity of 10 L Emphasized need for patient to consider hospice referral, referred back to hospice

## 2018-12-01 NOTE — Patient Instructions (Addendum)
You were seen today by Lauraine Rinne, NP  for:   1. Chronic respiratory failure with hypoxia and hypercapnia (HCC)  - Ambulatory referral to Hospice  Continue oxygen therapy as prescribed  >>>maintain oxygen saturations greater than 88 percent  >>>if unable to maintain oxygen saturations please contact the office  >>>do not smoke with oxygen  >>>can use nasal saline gel or nasal saline rinses to moisturize nose if oxygen causes dryness   2. Tobacco abuse  We recommend that you stop smoking.  >>>You need to set a quit date >>>If you have friends or family who smoke, let them know you are trying to quit and not to smoke around you or in your living environment  Smoking Cessation Resources:  1 800 QUIT NOW  >>> Patient to call this resource and utilize it to help support her quit smoking >>> Keep up your hard work with stopping smoking  You can also contact the [facility] >>>For smoking cessation classes call [phone]  We do not recommend using e-cigarettes as a form of stopping smoking  You can sign up for smoking cessation support texts and information:  >>>https://smokefree.gov/smokefreetxt    3. Chronic obstructive pulmonary disease with acute exacerbation (HCC)  - Ambulatory referral to Hospice  Trelegy Ellipta  >>> 1 puff daily in the morning >>>rinse mouth out after use  >>> This inhaler contains 3 medications that help manage her respiratory status, contact our office if you cannot afford this medication or unable to remain on this medication   4. COPD exacerbation (HCC)  - azithromycin (ZITHROMAX) 250 MG tablet; 500mg  (two tablets) today, then 250mg  (1 tablet) for the next 4 days  Dispense: 6 tablet; Refill: 0  Increase prednisone to 40 mg daily for 7 days, then transition back to baseline and 20 mg daily  We will refer back to hospice today  We recommend today:  Orders Placed This Encounter  Procedures  . Ambulatory referral to Hospice     Referral Priority:   Routine    Referral Type:   Consultation    Referral Reason:   Specialty Services Required    Requested Specialty:   Hospice Services    Number of Visits Requested:   1   Orders Placed This Encounter  Procedures  . Ambulatory referral to Hospice   Meds ordered this encounter  Medications  . azithromycin (ZITHROMAX) 250 MG tablet    Sig: 500mg  (two tablets) today, then 250mg  (1 tablet) for the next 4 days    Dispense:  6 tablet    Refill:  0    Follow Up:    Return in about 1 week (around 12/08/2018), or if symptoms worsen or fail to improve, for Follow up with Wyn Quaker FNP-C.   Please do your part to reduce the spread of COVID-19:      Reduce your risk of any infection  and COVID19 by using the similar precautions used for avoiding the common cold or flu:  Marland Kitchen Wash your hands often with soap and warm water for at least 20 seconds.  If soap and water are not readily available, use an alcohol-based hand sanitizer with at least 60% alcohol.  . If coughing or sneezing, cover your mouth and nose by coughing or sneezing into the elbow areas of your shirt or coat, into a tissue or into your sleeve (not your hands). Langley Gauss A MASK when in public  . Avoid shaking hands with others and consider head nods  or verbal greetings only. . Avoid touching your eyes, nose, or mouth with unwashed hands.  . Avoid close contact with people who are sick. . Avoid places or events with large numbers of people in one location, like concerts or sporting events. . If you have some symptoms but not all symptoms, continue to monitor at home and seek medical attention if your symptoms worsen. . If you are having a medical emergency, call 911.   [place] / e-Visit: [email or web address]         [facility]: [facility]: [id number]                    [facility]: [id number]     It is flu season:   >>> Best ways to protect herself from the flu: Receive the yearly flu vaccine, practice good hand hygiene washing with soap and also using hand sanitizer when available, eat a nutritious meals, get adequate rest, hydrate appropriately   Please contact the office if your symptoms worsen or you have concerns that you are not improving.   Thank you for choosing [facility] for your healthcare, and for allowing [place] to partner with you on your healthcare journey. I am thankful to be able to provide care to you today.   Wyn Quaker FNP-C

## 2018-12-01 NOTE — Progress Notes (Signed)
Virtual Visit via Telephone Note  I connected with [name] on 12/01/18 at  3:30 PM EST by telephone and verified that I am speaking with the correct person using two identifiers.  Location: Patient: Home Provider: [facility] - [id number], [place], [place], [place] [address]   I discussed the limitations, risks, security and privacy concerns of performing an evaluation and management service by telephone and the availability of in person appointments. I also discussed with the patient that there may be a patient responsible charge related to this service. The patient expressed understanding and agreed to proceed.  Patient consented to consult via telephone: Yes People present and their role in pt care: Pt     HPI:  73 year old female current every day smoker followed in our office for COPD and chronic respiratory failure  PMH: Hyperlipidemia, anxiety, diabetes, fatigue, gerd, pulmonary htn,  Smoking Assessment: Current everyday smoker. 0.25 ppd. 12.5 pack years.  Maintenance: Trelegy Ellipta, 20 mg of prednisone daily Pt of Dr. Purnell Shoemaker   12/01/2018  - Visit   73 year old female current everyday smoker completing a televisit with our office today.  Patient was originally scheduled for an office visit evaluation but patient was unable to ambulate to maintain saturations above 70%.  Patient's last pulmonary function testing in 2014 showed a DLCO that was severe 21%.  Patient has appropriate oxygen levels when at rest on 6 L.  Oxygen saturations with ambulation are 70 to 80% on 6 L.  Patient continues to be reluctant to transition from palliative care to hospice.  She is under the impression that she will have to give up all the follow-up with her providers.  Tests:   11/02/2018-SARS-CoV-2-negative  08/27/2018-echocardiogram- LV ejection fraction 60 to 65%, increased left ventricular wall thickness, impaired relaxation, RV has normal systolic function, cavity was  normal, mild diastolic dysfunction  [id number] function test- FVC 1.46 (64% predicted), FEV1 0.69 (39% predicted), ratio 47  10/30/2016-CT chest with contrast- stable appearance of mediastinal adenopathy, bilateral hilar adenopathy is identified, small nonspecific pulmonary nodules are unchanged from previous exam, emphysema  FENO:  No results found for: NITRICOXIDE  PFT: PFT Results Latest Ref Rng & Units 08/31/2018 07/29/2013  FVC-Pre L 1.46 1.85  FVC-Predicted Pre % 64 77  FVC-Post L - 2.03  FVC-Predicted Post % - 84  Pre FEV1/FVC % % 47 48  Post FEV1/FCV % % - 49  FEV1-Pre L 0.69 0.89  FEV1-Predicted Pre % 39 47  FEV1-Post L - 1.00    WALK:  SIX MIN WALK 06/02/2015 05/12/2015 06/30/2012 06/30/2012  Supplimental Oxygen during Test? (L/min) Yes Yes Yes No  O2 Flow Rate 3 4 2  -  Type Continuous Continuous Continuous -  Tech Comments: Pt's lowest ambulatory sat on 3L was 87% her lowest on 4L was 93%. She walked at a slow pace with 1 break.  Pt did not want to continue to 2nd lap d/t dyspnea.  test stopped due to increased SOB and foot pain//lmr After walking approx 50 ft o2 sat was 86%ra and this increased to 90%ra with rest//lmr    Imaging: Dg Chest Port 1 View  Result Date: 11/02/2018 CLINICAL DATA:  Shortness of breath EXAM: PORTABLE CHEST 1 VIEW COMPARISON:  August 28, 2018 FINDINGS: Cardiomegaly is stable. Pulmonary vascularity is within normal limits. Consolidation is noted in the left base with suspected loculated pleural effusion. There is a smaller pleural effusion on the right. Interstitium is somewhat thickened diffusely, stable. No adenopathy evident.  No bone lesions. IMPRESSION: 1. New small right pleural effusion. Stable apparent loculated pleural effusion on the left. 2. Airspace opacity left base, stable. Question scarring versus pneumonia. Both entities may be present concurrently. 3.  Somewhat diffuse interstitial thickening, stable. 4. Stable cardiomegaly.  Pulmonary vascularity is stable and grossly normal. Electronically Signed   By: Lowella Grip III M.D.   On: 11/02/2018 17:48    Lab Results:  CBC    Component Value Date/Time   WBC 11.2 (H) 11/06/2018 0340   RBC 4.59 11/06/2018 0340   HGB 9.2 (L) 11/06/2018 0340   HCT 36.0 11/06/2018 0340   PLT 406 (H) 11/06/2018 0340   MCV 78.4 (L) 11/06/2018 0340   MCH 20.0 (L) 11/06/2018 0340   MCHC 25.6 (L) 11/06/2018 0340   RDW 19.0 (H) 11/06/2018 0340   LYMPHSABS 1.3 11/02/2018 1729   MONOABS 1.1 (H) 11/02/2018 1729   EOSABS 0.1 11/02/2018 1729   BASOSABS 0.1 11/02/2018 1729    BMET    Component Value Date/Time   NA 140 11/06/2018 0340   K 3.5 11/06/2018 0340   CL 104 11/06/2018 0340   CO2 28 11/06/2018 0340   GLUCOSE 184 (H) 11/06/2018 0340   BUN 19 11/06/2018 0340   CREATININE 0.85 11/06/2018 0340   CALCIUM 8.6 (L) 11/06/2018 0340   GFRNONAA >60 11/06/2018 0340   GFRAA >60 11/06/2018 0340    BNP    Component Value Date/Time   BNP 72.3 11/02/2018 1729    ProBNP    Component Value Date/Time   PROBNP 16.0 03/10/2015 1449    Specialty Problems      Pulmonary Problems   Bronchiectasis (HCC)    On CT chest. RUL. Mild      Pulmonary nodule    55mm RUL and RLL march 2012 CT with 1.4 cm Rt peribronchial node CT chest 12/2012: IMPRESSION:  No CT evidence for acute pulmonary embolus.  Progression of mediastinal lymphadenopathy since the study from 2012  although no substantial change since 06/25/2012.  Emphysema with scattered bilateral pulmonary nodules, right greater  than left. These are unchanged since 06/25/2012 in some been stable  since 04/04/2010. Consider repeat CT without contrast in 6 months to  ensure continued stability. If the patient is at high risk for  bronchogenic carcinoma, follow-up chest CT at 6-12 months is  recommended. If the patient is at low risk for bronchogenic  carcinoma, follow-up chest CT at 12 months is recommended.   CT chest  03/21/15 >Mediastinal and bilateral hilar lymphadenopathy is increased since 01/11/2013. Findings are concerning for lymphoma, with metastatic disease less likely but not excluded. Consider further evaluation with PET-CT and/or tissue sampling. 2. Subcentimeter pulmonary nodules in both lungs are unchanged for over 2 years and are probably benign. No new lung findings. >PET scan 03/24/2015        COPD (chronic obstructive pulmonary disease) ([place])     FEV-1 in 2008 was 63% with a diffusion capacity of 33%.  fev1 in 2014: was 49% with dlco 21 - 02/16/2013 p extensive coaching HFA effectiveness =    75%       Chronic respiratory failure ([place])    Followed in Pulmonary clinic/ [facility]/ Ramaswamy  - 06/30/2012 desat to 86%  RA walking 50 ft, recovered to 90% at rest - 06/30/2012  Walked 1lpm x 3 laps @ 185 ft each stopped due to  Sob, no desat  rec 02 2lpm with activity and sleeping, ok at rest 02/2015 D Dimer  is neg.       Acute on chronic respiratory failure with hypoxia (HCC)   COPD exacerbation (HCC)      Allergies  Allergen Reactions  . Ambien [Zolpidem Tartrate] Other (See Comments)    Causes nightmares    Immunization History  Administered Date(s) Administered  . Fluad Quad(high Dose 65+) 11/05/2018  . Influenza Split 11/12/2010  . Influenza, High Dose Seasonal PF 11/09/2012, 10/31/2015, 11/20/2017  . Influenza,inj,Quad PF,6+ Mos 10/20/2014  . Pneumococcal Conjugate-13 05/17/2013  . Pneumococcal Polysaccharide-23 03/22/2010, 06/24/2016    Past Medical History:  Diagnosis Date  . Arrhythmia   . Bronchiectasis march 2012   On CT chest. RUL. Mild  . CHF (congestive heart failure) ([place])   . Chicken pox   . Chronic diastolic heart failure ([place])   . Chronic respiratory failure ([place])    Followed in Pulmonary clinic/ [facility]/ Ramaswamy  - 06/30/2012 desat to 86%  RA walking 50 ft, recovered to 90% at rest - 06/30/2012  Walked 1lpm x 3 laps @ 185 ft each  stopped due to  Sob, no desat  rec 02 2lpm with activity and sleeping, ok at rest   . COPD (chronic obstructive pulmonary disease) ([place])     FEV-1 in 2008 was 63% with a diffusion capacity of 33%.   . Generalized anxiety disorder   . History of cervical cancer 1982  . Hyperlipidemia   . Hypertension   . Leg swelling    Venous doppler right 07/21/12 >>Neg    . 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 Medical history non-contributory   . Medical non-compliance   . MITRAL VALVE PROLAPSE   . Obesity, unspecified   . Pulmonary nodule 03/2010   32mm RUL and RLL 1st seen march 2012 CT, ?progression 12/2012 CT  . Seasonal allergies   . Tobacco abuse     Smokes one pack a day since age 19.    Tobacco History: Social History   Tobacco Use  Smoking Status Current Every Day Smoker  . Packs/day: 0.25  . Years: 50.00  . Pack years: 12.50  . Types: Cigarettes  Smokeless Tobacco Never Used  Tobacco Comment   3-4 cigaretters per day 11/20/2017    Ready to quit: No Counseling given: Yes Comment: 3-4 cigaretters per day 11/20/2017   Smoking assessment and cessation counseling  Patient currently smoking: 3 to 4 cigarettes a day I have advised the patient to quit/stop smoking as soon as possible due to high risk for multiple medical problems.  It will also be very difficult for [place] to manage patient's  respiratory symptoms and status if we continue to expose her lungs to a known irritant.  We do not advise e-cigarettes as a form of stopping smoking.  Patient is  willing to quit smoking.  She is using reduced to quit method.  Has not set quit date yet.  I have advised the patient that we can assist and have options of nicotine replacement therapy, provided smoking cessation education today, provided smoking cessation counseling, and provided cessation resources.  Follow-up next office visit office visit for assessment of smoking cessation.    Smoking  cessation counseling advised for: 5 min   CPT 99406 3 to 10 minutes CPT 99407 greater than 10 minutes   Outpatient Encounter Medications as of 12/01/2018  Medication Sig  . acetaZOLAMIDE (DIAMOX) 250 MG tablet Take 1 tablet (250 mg total) by mouth 2 (two)  times daily. (Patient not taking: Reported on 11/02/2018)  . albuterol (PROVENTIL HFA;VENTOLIN HFA) 108 (90 Base) MCG/ACT inhaler Inhale 2 puffs into the lungs every 6 (six) hours as needed for wheezing or shortness of breath.  . ALPRAZolam (XANAX) 0.5 MG tablet Take 1 tablet (0.5 mg total) by mouth 3 (three) times daily.  Marland Kitchen azithromycin (ZITHROMAX) 250 MG tablet 500mg  (two tablets) today, then 250mg  (1 tablet) for the next 4 days  . diphenhydramine-acetaminophen (TYLENOL PM) 25-500 MG TABS tablet Take 2 tablets by mouth at bedtime as needed (pain/sleep).  . feeding supplement, GLUCERNA SHAKE, (GLUCERNA SHAKE) LIQD Take 237 mLs by mouth 3 (three) times daily between meals. (Patient not taking: Reported on 11/25/2018)  . fluticasone (FLONASE) 50 MCG/ACT nasal spray Place 2 sprays into both nostrils daily.  . Fluticasone-Umeclidin-Vilant (TRELEGY ELLIPTA) 100-62.5-25 MCG/INH AEPB Inhale 1 puff into the lungs daily.  . furosemide (LASIX) 40 MG tablet Take 40 mg by mouth daily.   Marland Kitchen GLUCOCOM LANCETS 33G MISC Use with Test Strips to take blood sugars  . Insulin Glargine (LANTUS SOLOSTAR) 100 UNIT/ML Solostar Pen Inject 25 Units into the skin at bedtime. DAILY AT 10 PM.  . Insulin Pen Needle (BD PEN NEEDLE NANO U/F) 32G X 4 MM MISC USE TO ADMINISTER LANTUS INSULIN  . nystatin (MYCOSTATIN) 100000 UNIT/ML suspension Take 5 mLs (500,000 Units total) by mouth 4 (four) times daily. (Patient taking differently: Take 5 mLs by mouth See admin instructions. Swish and swallow 5 mls every morning after using Trelegy)  . pantoprazole (PROTONIX) 40 MG tablet Take 1 tablet (40 mg total) by mouth daily.  Marland Kitchen PARoxetine (PAXIL) 40 MG tablet TAKE 1 TABLET BY MOUTH EVERY  DAY IN THE MORNING. (Patient taking differently: Take 40 mg by mouth every morning. TAKE 1 TABLET BY MOUTH EVERY DAY IN THE MORNING.)  . predniSONE (DELTASONE) 20 MG tablet Take 1 tablet (20 mg total) by mouth daily with breakfast. One daily   No facility-administered encounter medications on file as of 12/01/2018.      Review of Systems  Review of Systems  Constitutional: Positive for fatigue. Negative for activity change and fever.  HENT: Negative for sinus pressure, sinus pain and sore throat.   Respiratory: Positive for cough, shortness of breath and wheezing.   Cardiovascular: Negative for chest pain and palpitations.  Gastrointestinal: Negative for diarrhea, nausea and vomiting.  Musculoskeletal: Negative for arthralgias.  Neurological: Negative for dizziness.  Psychiatric/Behavioral: Negative for sleep disturbance. The patient is not nervous/anxious.      Physical Exam  Deferred as this is a televisit   Assessment & Plan:   Chronic respiratory failure ([place]) Plan: Maintain oxygen saturations greater than 88% Order placed to adapt patient see me for a concentrator with a capacity of 10 L Emphasized need for patient to consider hospice referral, referred back to hospice  COPD (chronic obstructive pulmonary disease) ([place])  Plan:  Increase prednisone to 40 mg daily for 7 days, then transition back to 20 mg daily Z-Pak today Continue Trelegy Ellipta  We recommended stopping smoking  Continue oxygen therapy  Establish with hospice  Establish with [facility]     COPD exacerbation ([place]) Plan: Z-Pak today Increase prednisone to 40 mg daily for 7 days, then resume 20 mg daily dose  Tobacco abuse Plan: Emphasized need for patient to stop smoking  I have also contacted hospice/palliative care to notify them that we are rereferring patient for reevaluation.  They report they will follow-up with the patient.  Return in about 1 week (around 12/08/2018), or if symptoms worsen  or fail to improve, for Follow up with Wyn Quaker FNP-C.   Lauraine Rinne, NP 12/01/2018   This appointment was 25 minutes long with over 50% of the time in direct face-to-face patient care, assessment, plan of care, and follow-up.

## 2018-12-01 NOTE — Assessment & Plan Note (Signed)
Plan: Emphasized need for patient to stop smoking 

## 2018-12-01 NOTE — Assessment & Plan Note (Signed)
Plan: Z-Pak today Increase prednisone to 40 mg daily for 7 days, then resume 20 mg daily dose

## 2018-12-01 NOTE — Telephone Encounter (Signed)
Spoke with patient. She stated that she was attempting to get dressed to come into our office for her appt at 330pm but she was not feeling well. She stated that her SOB has returned as well as some chest pressure. Her O2 ranged from 78-85% with 6L of continuous oxygen. She wanted to know if Aaron Edelman could call in a zpak and prednisone for her. Advised her that I would go ahead and get her scheduled for a televisit with Aaron Edelman instead of her coming into the office. She verbalized understanding.   Nothing further needed at time of call.

## 2018-12-02 NOTE — Progress Notes (Signed)
COMMUNITY PALLIATIVE CARE RN NOTE  PATIENT NAME: [name] DOB: 1945-11-01 MRN: [id number]  PRIMARY CARE PROVIDER: Binnie Rail, MD  RESPONSIBLE PARTY:  Acct ID - Guarantor Home Phone Work Phone Relationship Acct Type  [id number] [facility] [phone]  Self P/F     [address], [place], [place] [address]   Covid-19 Pre-screening Negative  PLAN OF CARE and INTERVENTION:  1. ADVANCE CARE PLANNING/GOALS OF CARE: Goal is to remain in her home for as long as possible. She is a Full code. 2. PATIENT/CAREGIVER EDUCATION: Dyspnea Management, Safe Mobility 3. DISEASE STATUS: Prior to my visit, patient called stating that she has an appointment scheduled with her Pulmonologist this evening at 3:30 pm, but is very short of breath. She took her Xanax earlier this am and her Ventolin inhaler. Instructed patient to take another Xanax, use her nebulizer treatment and to contact her Pulmonologist. RN visit made. Patient states that she was able to speak with her Pulmonologist and was instructed to stay at home vs coming into the office d/t dyspnea. They increased her Prednisone to 40 mg daily x 7 days, then to go back to her maintenance dose of 20 mg. They also prescribed a Z-pak. Patient's breathing has improved since taking her PRN Xanax and nebulizer. Her oxygen level was ranging between 84-88% on 6L during visit. She says her Pulmonology office is faxing another referral for hospice for a re-evaluation because she was under the impression that she would not be able to continue to see her current doctors if she transitioned to hospice care. So patient declined hospice at that time. Patient also states that if she goes with hospice, she will not be able to keep her current home health aide services through [facility], which concerns her. Also during visit, PT called with [facility] and she agreed to a visit from them on Friday 12/04/18 at 11:00am for a therapy session. I am unsure if patient  will be agreeable to hospice just yet, because she would not be able to have therapy and be under hospice at the same time, unless she pays for therapy privately which is not an option financially for her. Prior to leaving the visit, she says that she may tell the hospice Admissions nurse when she calls that she wants to wait until after a few therapy sessions to see what they say first. Will continue to monitor.   HISTORY OF PRESENT ILLNESS:  [name] who resides at home alone. Palliative care team continues to follow patient. Will continue to visit monthly and PRN while under our services.  CODE STATUS: Full Code ADVANCED DIRECTIVES: N MOST FORM: yes PPS: 50%   PHYSICAL EXAM:   VITALS: Today's Vitals   12/01/18 1439  BP: 113/67  Pulse: (!) 108  Resp: 20  Temp: (!) 97.3 F (36.3 C)  TempSrc: Temporal  SpO2: (!) 88%  PainSc: 0-No pain    LUNGS: decreased breath sounds CARDIAC: Cor Tachy EXTREMITIES: No edema SKIN: Skin color, texture, turgor normal. No rashes or lesions  NEURO: Alert and oriented x 3, generalized weakness, ambulatory with walker   (Duration of visit and documentation 75 minutes)   Daryl Eastern, RN BSN

## 2018-12-03 ENCOUNTER — Telehealth: Payer: Self-pay | Admitting: Pulmonary Disease

## 2018-12-03 ENCOUNTER — Telehealth: Payer: Self-pay | Admitting: Internal Medicine

## 2018-12-03 DIAGNOSIS — J9621 Acute and chronic respiratory failure with hypoxia: Secondary | ICD-10-CM | POA: Diagnosis not present

## 2018-12-03 DIAGNOSIS — E1165 Type 2 diabetes mellitus with hyperglycemia: Secondary | ICD-10-CM | POA: Diagnosis not present

## 2018-12-03 DIAGNOSIS — I5033 Acute on chronic diastolic (congestive) heart failure: Secondary | ICD-10-CM | POA: Diagnosis not present

## 2018-12-03 DIAGNOSIS — I959 Hypotension, unspecified: Secondary | ICD-10-CM | POA: Diagnosis not present

## 2018-12-03 DIAGNOSIS — J449 Chronic obstructive pulmonary disease, unspecified: Secondary | ICD-10-CM | POA: Diagnosis not present

## 2018-12-03 DIAGNOSIS — I110 Hypertensive heart disease with heart failure: Secondary | ICD-10-CM | POA: Diagnosis not present

## 2018-12-03 NOTE — Telephone Encounter (Signed)
Reviewed recent phone notes

## 2018-12-03 NOTE — Telephone Encounter (Signed)
Routing to dr burns, fyi....looks like pulmonary is managing meds for this----and they have left message trying to reach patient----if you think patient needs to be seen by you, dr burns, please advise, we will call her to see if she can schedule appt., thanks

## 2018-12-03 NOTE — Telephone Encounter (Signed)
Patient was seen at 12:35pm.   Fasting blood sugar : 250 Weight gain of 12 lbs since last visit Increased measurements of abdominal and ankle girth  Increased shortness of breath with cough Home smelled like smoke and had several cigarettes in ashtray beside her Pt is refusing to go to the ED.  Refused to let home health call 911.   States if she feels worse later on she will go.   Patient states she is at her "baseline".   Not adhering to low sodium diet by eating vienna sausages. Spoke with pulmonary 2 days ago and has started on azithromycin.

## 2018-12-03 NOTE — Telephone Encounter (Signed)
Left message for patient to call back  

## 2018-12-03 NOTE — Telephone Encounter (Signed)
Routing to dr burns, see other phone note

## 2018-12-03 NOTE — Telephone Encounter (Signed)
Copied from [place] [id number]. Topic: General - Inquiry >> Dec 03, 2018  1:05 PM Mathis Bud wrote: Reason for CRM: [facility]aels from [facility] had an appt today with patient, patient is experiencing shortness of breath, a little bit of a weight gain and swollen ankles. Lytle Michaels informed that the patient will be going to the ER today regarding these matters  Call back [phone]

## 2018-12-04 ENCOUNTER — Telehealth: Payer: Self-pay

## 2018-12-04 DIAGNOSIS — I959 Hypotension, unspecified: Secondary | ICD-10-CM | POA: Diagnosis not present

## 2018-12-04 DIAGNOSIS — J449 Chronic obstructive pulmonary disease, unspecified: Secondary | ICD-10-CM | POA: Diagnosis not present

## 2018-12-04 DIAGNOSIS — I110 Hypertensive heart disease with heart failure: Secondary | ICD-10-CM | POA: Diagnosis not present

## 2018-12-04 DIAGNOSIS — E1165 Type 2 diabetes mellitus with hyperglycemia: Secondary | ICD-10-CM | POA: Diagnosis not present

## 2018-12-04 DIAGNOSIS — J9621 Acute and chronic respiratory failure with hypoxia: Secondary | ICD-10-CM | POA: Diagnosis not present

## 2018-12-04 DIAGNOSIS — I5033 Acute on chronic diastolic (congestive) heart failure: Secondary | ICD-10-CM | POA: Diagnosis not present

## 2018-12-04 NOTE — Telephone Encounter (Signed)
Copied from [place] [id number]. Topic: General - Inquiry >> Dec 03, 2018  1:05 PM Mathis Bud wrote: Reason for CRM: L[facility]le Michaels from [facility] had an appt today with patient, patient is experiencing shortness of breath, a little bit of a weight gain and swollen ankles. Lytle Michaels informed that the patient will be going to the ER today regarding these matters  Call back [phone]

## 2018-12-04 NOTE — Telephone Encounter (Signed)
noted 

## 2018-12-08 ENCOUNTER — Other Ambulatory Visit: Payer: Self-pay

## 2018-12-08 ENCOUNTER — Ambulatory Visit (INDEPENDENT_AMBULATORY_CARE_PROVIDER_SITE_OTHER): Payer: Medicare Other | Admitting: Pulmonary Disease

## 2018-12-08 ENCOUNTER — Encounter: Payer: Self-pay | Admitting: Pulmonary Disease

## 2018-12-08 DIAGNOSIS — J9612 Chronic respiratory failure with hypercapnia: Secondary | ICD-10-CM

## 2018-12-08 DIAGNOSIS — J441 Chronic obstructive pulmonary disease with (acute) exacerbation: Secondary | ICD-10-CM | POA: Diagnosis not present

## 2018-12-08 DIAGNOSIS — J9611 Chronic respiratory failure with hypoxia: Secondary | ICD-10-CM | POA: Diagnosis not present

## 2018-12-08 DIAGNOSIS — Z720 Tobacco use: Secondary | ICD-10-CM

## 2018-12-08 DIAGNOSIS — R5381 Other malaise: Secondary | ICD-10-CM | POA: Diagnosis not present

## 2018-12-08 DIAGNOSIS — Z7189 Other specified counseling: Secondary | ICD-10-CM | POA: Diagnosis not present

## 2018-12-08 DIAGNOSIS — Z7952 Long term (current) use of systemic steroids: Secondary | ICD-10-CM | POA: Diagnosis not present

## 2018-12-08 MED ORDER — PREDNISONE 20 MG PO TABS: 20.0000 mg | ORAL_TABLET | Freq: Every day | ORAL | 6 refills | Status: DC

## 2018-12-08 NOTE — Assessment & Plan Note (Signed)
Currently maintained on 20 mg of prednisone  Plan: Discussed with patient today that this is a high amount of daily steroid use.  Unfortunately due to the fact the patient has severe COPD, chronic respiratory failure, prone to COPD exacerbations, and continues to smoke we will maintain the patient on 20 mg prednisone daily.  We discussed the risk of this.  Emphasized the need for the patient to establish with hospice for chronic management of lung disease and symptoms.  This will also shift our focus into improving overall quality life and symptom management

## 2018-12-08 NOTE — Assessment & Plan Note (Signed)
  Plan:  Prednisone 20 mg daily Continue Trelegy Ellipta  We recommended stopping smoking  Continue oxygen therapy  Establish with hospice  Establish with [facility]

## 2018-12-08 NOTE — Patient Instructions (Addendum)
You were seen today by Lauraine Rinne, NP  for:   1. Chronic obstructive pulmonary disease with acute exacerbation (HCC)  Trelegy Ellipta  >>> 1 puff daily in the morning >>>rinse mouth out after use  >>> This inhaler contains 3 medications that help manage her respiratory status, contact our office if you cannot afford this medication or unable to remain on this medication   - predniSONE (DELTASONE) 20 MG tablet; Take 1 tablet (20 mg total) by mouth daily with breakfast. One daily  Dispense: 30 tablet; Refill: 6  2. Tobacco abuse  We recommend that you stop smoking.  >>>You need to set a quit date >>>If you have friends or family who smoke, let them know you are trying to quit and not to smoke around you or in your living environment  Smoking Cessation Resources:  1 800 QUIT NOW  >>> Patient to call this resource and utilize it to help support her quit smoking >>> Keep up your hard work with stopping smoking  You can also contact the [facility] >>>For smoking cessation classes call [phone]  We do not recommend using e-cigarettes as a form of stopping smoking  You can sign up for smoking cessation support texts and information:  >>>https://smokefree.gov/smokefreetxt    3. Chronic respiratory failure with hypoxia and hypercapnia (HCC)  Continue oxygen therapy as prescribed  >>>maintain oxygen saturations greater than 88 percent  >>>if unable to maintain oxygen saturations please contact the office  >>>do not smoke with oxygen  >>>can use nasal saline gel or nasal saline rinses to moisturize nose if oxygen causes dryness   4. Physical deconditioning  Continue to work with PT / OT  5. Complex care coordination  Continue to work with palliative care and hospice, we recommend establishing with hospice  6. Current chronic use of systemic steroids  Continue 20 mg of prednisone daily   We recommend today:  No orders of the defined types were placed in  this encounter.  No orders of the defined types were placed in this encounter.  Meds ordered this encounter  Medications  . predniSONE (DELTASONE) 20 MG tablet    Sig: Take 1 tablet (20 mg total) by mouth daily with breakfast. One daily    Dispense:  30 tablet    Refill:  6    Follow Up:    Return in about 6 weeks (around 01/19/2019), or if symptoms worsen or fail to improve, for Follow up with Dr. Purnell Shoemaker.   Please do your part to reduce the spread of COVID-19:      Reduce your risk of any infection  and COVID19 by using the similar precautions used for avoiding the common cold or flu:  Marland Kitchen Wash your hands often with soap and warm water for at least 20 seconds.  If soap and water are not readily available, use an alcohol-based hand sanitizer with at least 60% alcohol.  . If coughing or sneezing, cover your mouth and nose by coughing or sneezing into the elbow areas of your shirt or coat, into a tissue or into your sleeve (not your hands). Langley Gauss A MASK when in public  . Avoid shaking hands with others and consider head nods or verbal greetings only. . Avoid touching your eyes, nose, or mouth with unwashed hands.  . Avoid close contact with people who are sick. . Avoid places or events with large numbers of people in one location, like concerts or sporting events. . If you have  some symptoms but not all symptoms, continue to monitor at home and seek medical attention if your symptoms worsen. . If you are having a medical emergency, call 911.   [place] / e-Visit: [email or web address]         [facility]: [facility]: [id number]                   [facility]: [id number]     It is flu season:   >>> Best ways to protect herself from the flu: Receive the yearly flu vaccine, practice good hand hygiene washing with soap  and also using hand sanitizer when available, eat a nutritious meals, get adequate rest, hydrate appropriately   Please contact the office if your symptoms worsen or you have concerns that you are not improving.   Thank you for choosing [facility] for your healthcare, and for allowing [place] to partner with you on your healthcare journey. I am thankful to be able to provide care to you today.   Wyn Quaker FNP-C

## 2018-12-08 NOTE — Assessment & Plan Note (Signed)
Plan: Maintain oxygen saturations greater than 88% Emphasized need for patient to consider hospice referral, referred back to hospice

## 2018-12-08 NOTE — Assessment & Plan Note (Signed)
Plan:  Continue home pt / OT

## 2018-12-08 NOTE — Progress Notes (Signed)
Virtual Visit via Telephone Note  I connected with Kerisha Beissel on 12/08/18 at  2:00 PM EST by telephone and verified that I am speaking with the correct person using two identifiers.  Location: Patient: Home Provider: Office [facility] - [id number], [place], [place], [place] [address]   I discussed the limitations, risks, security and privacy concerns of performing an evaluation and management service by telephone and the availability of in person appointments. I also discussed with the patient that there may be a patient responsible charge related to this service. The patient expressed understanding and agreed to proceed.  Patient consented to consult via telephone: Yes People present and their role in pt care: Pt    History of Present Illness:  73 year old female current every day smoker followed in our office for COPD and chronic respiratory failure  PMH: Hyperlipidemia, anxiety, diabetes, fatigue, gerd, pulmonary htn,  Smoking Assessment: Current everyday smoker. 0.25 ppd. 12.5 pack years.  Maintenance: Trelegy Ellipta, 20 mg of prednisone daily Pt of Dr. Purnell Shoemaker   Chief complaint: 1 week follow up from COPD Exac   73 year old female current everyday smoker completing a televisit with our office today after being treated last week telephonically for COPD exacerbation.  Patient reports that she feels that her breathing is better.  Patient sounds audibly better.  Patient has been contacted by hospice.  They are currently investigating whether or not the patient could be established with them and keep her home health aide that she has every day to help her with preparing meals and helping around her house.  Hospice is currently researching this.  Patient reports that she does feel better.  She is starting back on her 20 mg of prednisone daily.  She continues to work with physical therapy and Occupational Therapy due to her unsteady  gait.  Observations/Objective:  12/08/2018 - Sp02 - 88 -90 usually with exertion   11/02/2018-SARS-CoV-2-negative  08/27/2018-echocardiogram- LV ejection fraction 60 to 65%, increased left ventricular wall thickness, impaired relaxation, RV has normal systolic function, cavity was normal, mild diastolic dysfunction  [id number] function test- FVC 1.46 (64% predicted), FEV1 0.69 (39% predicted), ratio 47  10/30/2016-CT chest with contrast- stable appearance of mediastinal adenopathy, bilateral hilar adenopathy is identified, small nonspecific pulmonary nodules are unchanged from previous exam, emphysema  Assessment and Plan:  Tobacco abuse Plan: Emphasized need for patient to stop smoking  Physical deconditioning Plan:  Continue home pt / OT   COPD (chronic obstructive pulmonary disease) ([place])  Plan:  Prednisone 20 mg daily Continue Trelegy Ellipta  We recommended stopping smoking  Continue oxygen therapy  Establish with hospice  Establish with [name]     Chronic respiratory failure ([place]) Plan: Maintain oxygen saturations greater than 88% Emphasized need for patient to consider hospice referral, referred back to hospice  Complex care coordination Plan:  Will email hospice to follow up with pt   Current chronic use of systemic steroids Currently maintained on 20 mg of prednisone  Plan: Discussed with patient today that this is a high amount of daily steroid use.  Unfortunately due to the fact the patient has severe COPD, chronic respiratory failure, prone to COPD exacerbations, and continues to smoke we will maintain the patient on 20 mg prednisone daily.  We discussed the risk of this.  Emphasized the need for the patient to establish with hospice for chronic management of lung disease and symptoms.  This will also shift our focus into improving overall quality life and symptom  management   Follow Up Instructions:  Return in about 6 weeks (around 01/19/2019),  or if symptoms worsen or fail to improve, for Follow up with Dr. Purnell Shoemaker.   I discussed the assessment and treatment plan with the patient. The patient was provided an opportunity to ask questions and all were answered. The patient agreed with the plan and demonstrated an understanding of the instructions.   The patient was advised to call back or seek an in-person evaluation if the symptoms worsen or if the condition fails to improve as anticipated.  I provided 26 minutes of non-face-to-face time during this encounter.   Lauraine Rinne, NP

## 2018-12-08 NOTE — Assessment & Plan Note (Signed)
Plan: Emphasized need for patient to stop smoking 

## 2018-12-08 NOTE — Assessment & Plan Note (Signed)
Plan:  Will email hospice to follow up with pt

## 2018-12-10 ENCOUNTER — Ambulatory Visit: Payer: Self-pay | Admitting: *Deleted

## 2018-12-10 ENCOUNTER — Telehealth: Payer: Self-pay | Admitting: Pulmonary Disease

## 2018-12-10 NOTE — Telephone Encounter (Signed)
Called and spoke to pt. Pt states she isnt able to pick up her prednisone rx has it was just filled and picked up on 11/27/2018. Pt states that because she had to 40mg  x 7 days this made her run out sooner. Called [facility] and spoke with Mitzi Hansen and was advised it is too early to fill her pred rx. However, the out of pocket cost is $7 for the 30 day rx vs her normal $3 she pays when insurance covers it. Pt would only need to pay the out of pocket cost for one fill and then insurance would then pick up the cost thereafter. LMTCB for pt to inform.

## 2018-12-11 ENCOUNTER — Ambulatory Visit: Payer: Self-pay | Admitting: *Deleted

## 2018-12-11 NOTE — Telephone Encounter (Signed)
Attempted to call pt but unable to reach. Left message for pt to return call. 

## 2018-12-11 NOTE — Telephone Encounter (Signed)
Spoke with pt, aware of recs.  States she has already paid to pick up her prednisone rx.  Nothing further needed at this time- will close encounter.

## 2018-12-11 NOTE — Telephone Encounter (Signed)
[phone] calling back

## 2018-12-14 ENCOUNTER — Other Ambulatory Visit: Payer: Self-pay | Admitting: *Deleted

## 2018-12-14 ENCOUNTER — Telehealth: Payer: Self-pay

## 2018-12-14 NOTE — Telephone Encounter (Signed)
FYI

## 2018-12-14 NOTE — Telephone Encounter (Signed)
Copied from [place] [id number]. Topic: General - Other >> Dec 11, 2018  4:23 PM Berneta Levins wrote: Reason for CRM:   Tray from [facility] calling to report that patient cancelled PT visit - did not want to be seen today.

## 2018-12-14 NOTE — Patient Outreach (Signed)
[facility]) Care Management  12/14/2018  [name] September 01, 1945 [id number]   Subjective: Telephone call to patient's home  / mobile number, no answer, left HIPAA compliant voicemail message, and requested call back.   Objective:Per KPN (Knowledge Performance Now, point of care tool) and chart review,patient hospitalized10/12/2018 - 11/06/2018 for COPD exacerbation,Mediastinal LAD and pulmonary nodules, hospitalized8/05/2018 - 09/05/2018 forAcute on chronic respiratory failure with hypoxia, COPD,Acute on chronic diastolic CHF (congestive heart failure). Patient also has a history of diabetes, hyperlipidemia, tobacco abuse, and OSA. Patient has been active with [facility] in the past for COPDcomplex case management. Patient currently active with [name]. Has [name] for aide assistance. Lives alone. High risk for readmission. Limited support. Wears home 02 and has trilogy. Discharged from [facility] 09/25/2018.     Assessment: Received Medicare [place] on10/16/2020. Referral source:Victoria Brewer. Referral reason:community nurse for complex care and disease management follow up calls and assess for further needs. Per referral source Primary MDdoes Transition of care, patientmay need more PersonalCareServicehours but did notwant to talk,request that questions be left for when she gets home. Screening follow up completed and will follow patient for short term  COPD case management.     Plan:RNCMhas sent patient welcome letter packet with consent, will call patient for outreach follow up callwithin 4  business days, initial assessment completion,proceed with case closure within 10 business days if no return call.      Zayn Selley H. [name], BSN, [facility] Telephonic CM Phone: [phone] Fax: [phone]

## 2018-12-15 ENCOUNTER — Ambulatory Visit: Payer: Self-pay | Admitting: *Deleted

## 2018-12-15 ENCOUNTER — Telehealth: Payer: Self-pay

## 2018-12-15 DIAGNOSIS — J9621 Acute and chronic respiratory failure with hypoxia: Secondary | ICD-10-CM | POA: Diagnosis not present

## 2018-12-15 DIAGNOSIS — I110 Hypertensive heart disease with heart failure: Secondary | ICD-10-CM | POA: Diagnosis not present

## 2018-12-15 DIAGNOSIS — E1165 Type 2 diabetes mellitus with hyperglycemia: Secondary | ICD-10-CM | POA: Diagnosis not present

## 2018-12-15 DIAGNOSIS — J449 Chronic obstructive pulmonary disease, unspecified: Secondary | ICD-10-CM | POA: Diagnosis not present

## 2018-12-15 DIAGNOSIS — I959 Hypotension, unspecified: Secondary | ICD-10-CM | POA: Diagnosis not present

## 2018-12-15 DIAGNOSIS — I5033 Acute on chronic diastolic (congestive) heart failure: Secondary | ICD-10-CM | POA: Diagnosis not present

## 2018-12-15 NOTE — Telephone Encounter (Signed)
Copied from [place] [id number]. Topic: General - Inquiry >> Dec 15, 2018  1:08 PM Mathis Bud wrote: Reason for CRM: Glenard Haring from [facility] called requesting a call back from PCP nurse regarding patients BS.  Blood Sugar now is 367.  Glenard Haring is at the house with patient now.  Glenard Haring is waiting on a call back from PCP nurse. Call  back [phone]

## 2018-12-15 NOTE — Telephone Encounter (Signed)
I can not adjust her insulin based on one reading - can you get more readings from [place]  If consistently elevated her insulin dose will need to be increased.

## 2018-12-15 NOTE — Telephone Encounter (Signed)
Glenard Haring wanted to make you aware of pts high blood sugar of 367 right before she left. Pt has been taking 28 units nightly instead of 25 units. Glenard Haring advised pt to go to the ED which patient will probably not do. I told her I would send you a message and call both of them back if you suggest any med changes. Please advise.

## 2018-12-15 NOTE — Telephone Encounter (Signed)
I spoke with pt and advised that she keep a log of BS and keep [place] updated on readings. She states her sugars have not been running that high consistently and if they were she would call us.

## 2018-12-16 ENCOUNTER — Other Ambulatory Visit: Payer: Self-pay | Admitting: *Deleted

## 2018-12-16 NOTE — Telephone Encounter (Signed)
LVM letting [name] know as well that pt need to keep a record of BS readings.

## 2018-12-16 NOTE — Patient Outreach (Addendum)
[facility]) Care Management  12/16/2018  [name] Feb 20, 1945 [id number]   Subjective:  Telephone call to patient's home / mobile number, spoke with patient, and HIPAA verified.  States she remembers speaking with this RNCM in the past and is in agreement to complete brief follow up COPD assessment.   Patient states she is feeling ok, has had a COPD flare since our last conversation, no ED or hospitalizations,  feeling better, had several follow up telephone visits with pulmonologist, and the visits went well.  COPD assessment completed and verbal action plan education provided.  Patient states she does not have any Air traffic controller, transportation, [employer], or pharmacy needs at this time. States she is very appreciative of the follow up and is in agreement to continue to receive [place] [facility] services.     Objective:Per KPN (Knowledge Performance Now, point of care tool) and chart review,patient hospitalized10/12/2018 - 11/06/2018 for COPD exacerbation,Mediastinal LAD and pulmonary nodules, hospitalized8/05/2018 - 09/05/2018 forAcute on chronic respiratory failure with hypoxia, COPD,Acute on chronic diastolic CHF (congestive heart failure). Patient also has a history of diabetes, hyperlipidemia, tobacco abuse, and OSA. Patient has been active with [place] [facility] in the past for COPDcomplex case management. Patient currently active with [name]. Has [name] for aide assistance. Lives alone. High risk for readmission. Limited support. Wears home 02 and has trilogy. Discharged from [place] 09/25/2018.     Assessment: Received Medicare [place] on10/16/2020. Referral source:Victoria Brewer. Referral reason:community nurse for complex care and disease management follow up calls and assess for further needs. Per referral source Primary MDdoes Transition  of care, patientmay need more PersonalCareServicehours but did notwant to talk,request that questions be left for when she gets home. Screening follow up completed andwill follow patient for short term COPD case management.  Outpatient Encounter Medications as of 12/16/2018  Medication Sig Note  . acetaZOLAMIDE (DIAMOX) 250 MG tablet Take 1 tablet (250 mg total) by mouth 2 (two) times daily. (Patient not taking: Reported on 11/02/2018)   . albuterol (PROVENTIL HFA;VENTOLIN HFA) 108 (90 Base) MCG/ACT inhaler Inhale 2 puffs into the lungs every 6 (six) hours as needed for wheezing or shortness of breath. 08/29/2018: [facility]  . ALPRAZolam (XANAX) 0.5 MG tablet Take 1 tablet (0.5 mg total) by mouth 3 (three) times daily.   Marland Kitchen azithromycin (ZITHROMAX) 250 MG tablet 500mg  (two tablets) today, then 250mg  (1 tablet) for the next 4 days   . diphenhydramine-acetaminophen (TYLENOL PM) 25-500 MG TABS tablet Take 2 tablets by mouth at bedtime as needed (pain/sleep).   . feeding supplement, GLUCERNA SHAKE, (GLUCERNA SHAKE) LIQD Take 237 mLs by mouth 3 (three) times daily between meals. (Patient not taking: Reported on 11/25/2018)   . fluticasone (FLONASE) 50 MCG/ACT nasal spray Place 2 sprays into both nostrils daily.   . Fluticasone-Umeclidin-Vilant (TRELEGY ELLIPTA) 100-62.5-25 MCG/INH AEPB Inhale 1 puff into the lungs daily. 11/02/2018: .  . furosemide (LASIX) 40 MG tablet Take 40 mg by mouth daily.    Marland Kitchen GLUCOCOM LANCETS 33G MISC Use with Test Strips to take blood sugars   . Insulin Glargine (LANTUS SOLOSTAR) 100 UNIT/ML Solostar Pen Inject 25 Units into the skin at bedtime. DAILY AT 10 PM.   . Insulin Pen Needle (BD PEN NEEDLE NANO U/F) 32G X 4 MM MISC USE TO ADMINISTER LANTUS INSULIN   . nystatin (MYCOSTATIN) 100000 UNIT/ML suspension Take 5 mLs (500,000 Units total) by mouth 4 (four) times daily. (Patient taking differently:  Take 5 mLs by mouth See admin instructions. Swish and swallow 5 mls every  morning after using Trelegy)   . pantoprazole (PROTONIX) 40 MG tablet Take 1 tablet (40 mg total) by mouth daily. 11/02/2018: .  Marland Kitchen PARoxetine (PAXIL) 40 MG tablet TAKE 1 TABLET BY MOUTH EVERY DAY IN THE MORNING. (Patient taking differently: Take 40 mg by mouth every morning. TAKE 1 TABLET BY MOUTH EVERY DAY IN THE MORNING.) 08/29/2018: #[facility]  . predniSONE (DELTASONE) 20 MG tablet Take 1 tablet (20 mg total) by mouth daily with breakfast. One daily    No facility-administered encounter medications on file as of 12/16/2018.     Fall Risk  11/27/2018 09/29/2018 08/24/2018 08/11/2018 05/07/2018  Falls in the past year? 0 0 1 1 1   Comment - - - last fall October 2019 Last Fall October 2019  Number falls in past yr: 0 0 1 1 1   Injury with Fall? 0 0 0 0 0  Risk for fall due to : Impaired balance/gait Other (Comment) Impaired balance/gait;Impaired mobility History of fall(s);Medication side effect;Impaired balance/gait;Impaired mobility;Impaired vision History of fall(s);Impaired vision;Medication side effect;Impaired balance/gait;Impaired mobility  Risk for fall due to: Comment - No risk per patient. - - -  Follow up Education provided Falls prevention discussed Falls prevention discussed Falls evaluation completed;Education provided;Falls prevention discussed Falls evaluation completed;Falls prevention discussed;Education provided  Comment Late entry for 11/25/2018. - - - -     Depression screen [facility] 2/9 11/25/2018 09/29/2018 08/24/2018 03/26/2018 11/27/2017  Decreased Interest 1 1 2 1  0  Down, Depressed, Hopeless 1 1 2 1  0  PHQ - 2 Score 2 2 4 2  0  Altered sleeping 0 1 1 3  -  Tired, decreased energy 1 1 2 2  -  Change in appetite 0 1 1 2  -  Feeling bad or failure about yourself  1 0 1 1 -  Trouble concentrating 0 1 0 1 -  Moving slowly or fidgety/restless 0 0 0 2 -  Suicidal thoughts 0 0 1 0 -  PHQ-9 Score 4 6 10 13  -  Difficult doing work/chores Very difficult (No Data) Somewhat difficult  Somewhat difficult -  Some recent data might be hidden    [facility] CM Care Plan Problem One     Most Recent Value  Care Plan Problem One  Knowledge deficiet related to self care of COPD.  Role Documenting the Problem One  Care Management Telephonic [place] for Problem One  Active  [facility] Long Term Goal   Patient will have no hospitalizations or emergency room visits in the next 30 days.  [name] Long Term Goal Start Date  11/25/18  Interventions for Problem One Long Term Goal  Reviewed COPD action plan with patient and she is in agreement to plan.  [name] CM Short Term Goal #1   Patient will report she has went to follow up appointments as scheduled, in the next 14 days.  [name] CM Short Term Goal #1 Start Date  11/25/18  Interventions for Short Term Goal #1  RNCM verified patient has attended all follow up appointments as scheduled.    [name] CM Care Plan Problem Two     Most Recent Value  Care Plan Problem Two  Patient can not walk without getting short of breath.  Role Documenting the Problem Two  Care Management Telephonic [place] for Problem Two  Active  [name] CM Short Term Goal #1   Patient will report participation  with home health therapy and increase distance of ambulation without shortness of breath within next 14 days.  [name] CM Short Term Goal #1 Start Date  11/25/18  Interventions for Short Term Goal #2   RNCM verified patient is performing daily home exercises and has increased endurance.        Plan:RNCMhas sent patient welcome letter packet with consent,will call patient for outreachfollow up [place] days, initial assessment completion,proceed with case closure within 10 business days if no return call. RNCM will send primary MD barrier/ involvement letter and route assessment.       Nelia Rogoff H. [name], BSN, [place] [facility] Telephonic CM Phone: [phone] Fax: [phone]

## 2018-12-21 DIAGNOSIS — J449 Chronic obstructive pulmonary disease, unspecified: Secondary | ICD-10-CM | POA: Diagnosis not present

## 2018-12-21 DIAGNOSIS — J9621 Acute and chronic respiratory failure with hypoxia: Secondary | ICD-10-CM | POA: Diagnosis not present

## 2018-12-21 DIAGNOSIS — E1165 Type 2 diabetes mellitus with hyperglycemia: Secondary | ICD-10-CM | POA: Diagnosis not present

## 2018-12-21 DIAGNOSIS — I959 Hypotension, unspecified: Secondary | ICD-10-CM | POA: Diagnosis not present

## 2018-12-21 DIAGNOSIS — I5033 Acute on chronic diastolic (congestive) heart failure: Secondary | ICD-10-CM | POA: Diagnosis not present

## 2018-12-21 DIAGNOSIS — I110 Hypertensive heart disease with heart failure: Secondary | ICD-10-CM | POA: Diagnosis not present

## 2018-12-22 ENCOUNTER — Telehealth: Payer: Self-pay | Admitting: *Deleted

## 2018-12-22 NOTE — Telephone Encounter (Signed)
Contacted and spoke with patient to arrange a home visit. Visit scheduled for 12/24/18 at 2:00 pm.

## 2018-12-24 ENCOUNTER — Other Ambulatory Visit: Payer: Medicare Other | Admitting: *Deleted

## 2018-12-24 ENCOUNTER — Other Ambulatory Visit: Payer: Self-pay

## 2018-12-24 DIAGNOSIS — I959 Hypotension, unspecified: Secondary | ICD-10-CM | POA: Diagnosis not present

## 2018-12-24 DIAGNOSIS — J449 Chronic obstructive pulmonary disease, unspecified: Secondary | ICD-10-CM | POA: Diagnosis not present

## 2018-12-24 DIAGNOSIS — I5033 Acute on chronic diastolic (congestive) heart failure: Secondary | ICD-10-CM | POA: Diagnosis not present

## 2018-12-24 DIAGNOSIS — J9621 Acute and chronic respiratory failure with hypoxia: Secondary | ICD-10-CM | POA: Diagnosis not present

## 2018-12-24 DIAGNOSIS — Z515 Encounter for palliative care: Secondary | ICD-10-CM

## 2018-12-24 DIAGNOSIS — I110 Hypertensive heart disease with heart failure: Secondary | ICD-10-CM | POA: Diagnosis not present

## 2018-12-24 DIAGNOSIS — E1165 Type 2 diabetes mellitus with hyperglycemia: Secondary | ICD-10-CM | POA: Diagnosis not present

## 2018-12-25 DIAGNOSIS — E1165 Type 2 diabetes mellitus with hyperglycemia: Secondary | ICD-10-CM | POA: Diagnosis not present

## 2018-12-25 DIAGNOSIS — J449 Chronic obstructive pulmonary disease, unspecified: Secondary | ICD-10-CM | POA: Diagnosis not present

## 2018-12-25 DIAGNOSIS — F329 Major depressive disorder, single episode, unspecified: Secondary | ICD-10-CM | POA: Diagnosis not present

## 2018-12-25 DIAGNOSIS — I959 Hypotension, unspecified: Secondary | ICD-10-CM | POA: Diagnosis not present

## 2018-12-25 DIAGNOSIS — J9621 Acute and chronic respiratory failure with hypoxia: Secondary | ICD-10-CM | POA: Diagnosis not present

## 2018-12-25 DIAGNOSIS — Z7951 Long term (current) use of inhaled steroids: Secondary | ICD-10-CM | POA: Diagnosis not present

## 2018-12-25 DIAGNOSIS — E7849 Other hyperlipidemia: Secondary | ICD-10-CM | POA: Diagnosis not present

## 2018-12-25 DIAGNOSIS — F17210 Nicotine dependence, cigarettes, uncomplicated: Secondary | ICD-10-CM | POA: Diagnosis not present

## 2018-12-25 DIAGNOSIS — K219 Gastro-esophageal reflux disease without esophagitis: Secondary | ICD-10-CM | POA: Diagnosis not present

## 2018-12-25 DIAGNOSIS — I110 Hypertensive heart disease with heart failure: Secondary | ICD-10-CM | POA: Diagnosis not present

## 2018-12-25 DIAGNOSIS — Z9981 Dependence on supplemental oxygen: Secondary | ICD-10-CM | POA: Diagnosis not present

## 2018-12-25 DIAGNOSIS — G4733 Obstructive sleep apnea (adult) (pediatric): Secondary | ICD-10-CM | POA: Diagnosis not present

## 2018-12-25 DIAGNOSIS — Z794 Long term (current) use of insulin: Secondary | ICD-10-CM | POA: Diagnosis not present

## 2018-12-25 DIAGNOSIS — E669 Obesity, unspecified: Secondary | ICD-10-CM | POA: Diagnosis not present

## 2018-12-25 DIAGNOSIS — E876 Hypokalemia: Secondary | ICD-10-CM | POA: Diagnosis not present

## 2018-12-25 DIAGNOSIS — Z9181 History of falling: Secondary | ICD-10-CM | POA: Diagnosis not present

## 2018-12-25 DIAGNOSIS — F419 Anxiety disorder, unspecified: Secondary | ICD-10-CM | POA: Diagnosis not present

## 2018-12-25 DIAGNOSIS — Z6836 Body mass index (BMI) 36.0-36.9, adult: Secondary | ICD-10-CM | POA: Diagnosis not present

## 2018-12-25 DIAGNOSIS — Z7952 Long term (current) use of systemic steroids: Secondary | ICD-10-CM | POA: Diagnosis not present

## 2018-12-25 DIAGNOSIS — I5033 Acute on chronic diastolic (congestive) heart failure: Secondary | ICD-10-CM | POA: Diagnosis not present

## 2018-12-28 ENCOUNTER — Other Ambulatory Visit: Payer: Self-pay | Admitting: *Deleted

## 2018-12-28 ENCOUNTER — Telehealth: Payer: Self-pay

## 2018-12-28 DIAGNOSIS — I5033 Acute on chronic diastolic (congestive) heart failure: Secondary | ICD-10-CM | POA: Diagnosis not present

## 2018-12-28 DIAGNOSIS — I110 Hypertensive heart disease with heart failure: Secondary | ICD-10-CM | POA: Diagnosis not present

## 2018-12-28 DIAGNOSIS — E1165 Type 2 diabetes mellitus with hyperglycemia: Secondary | ICD-10-CM | POA: Diagnosis not present

## 2018-12-28 DIAGNOSIS — I959 Hypotension, unspecified: Secondary | ICD-10-CM | POA: Diagnosis not present

## 2018-12-28 DIAGNOSIS — J449 Chronic obstructive pulmonary disease, unspecified: Secondary | ICD-10-CM | POA: Diagnosis not present

## 2018-12-28 DIAGNOSIS — J9621 Acute and chronic respiratory failure with hypoxia: Secondary | ICD-10-CM | POA: Diagnosis not present

## 2018-12-28 NOTE — Telephone Encounter (Signed)
Do you think she should follow up with Pulm?

## 2018-12-28 NOTE — Telephone Encounter (Signed)
Copied from [place] [id number]. Topic: General - Inquiry >> Dec 28, 2018  2:30 PM Virl Axe D wrote: Reason for CRM: Glenard Haring, RN with [facility] stated pt is on 6 liters, O2 saturation is between 80-88%. Pt is asymptomatic and is not having any dizzyness or headaches only reporting being sleepy. Blood sugar 281. Angel requesting callback from [place]. Please advise.

## 2018-12-28 NOTE — Progress Notes (Signed)
COMMUNITY PALLIATIVE CARE RN NOTE  PATIENT NAME: [name] DOB: 08-12-1945 MRN: [id number]  PRIMARY CARE PROVIDER: Binnie Rail, MD  RESPONSIBLE PARTY:  Acct ID - Guarantor Home Phone Work Phone Relationship Acct Type  [id number] [facility] [phone]  Self P/F     [address], [address], [place], [place] [address]   Covid-19 Pre-screening Negative  PLAN OF CARE and INTERVENTION:  1. ADVANCE CARE PLANNING/GOALS OF CARE: Goal is to remain in her home. She is a Full Code. 2. PATIENT/CAREGIVER EDUCATION: Symptom Management 3. DISEASE STATUS: RN visit made to patient's home. Upon arrival, she is just waking up from a nap. She remains alert and oriented and able to engage in appropriate conversation. She denies pain. She continues on oxygen at 6L/min via [place]. She does continue to become dyspneic with exertion, but feels that it has been tolerable lately. She says that she has not needed to utilize her nebulizers. She has been doing ok with only using her inhalers and routine medications. She continues to smoke. Her most recent issues has been regarding her blood sugars. She was having difficulties getting her glucose monitor to work. Demonstrated to patient how to use her monitor. Obtained CBG today and it is 351 after completing a meal. She states that she feels fine and that she is currently taking Lantus 26 units at bedtime. She does not want to go to the hospital or have PCP contacted.  Her intake is normal. Denies dysphagia. She is ambulatory using her walker and able to toilet, bathe and dress herself. She does require frequent rest periods between tasks. She continues to receive home health CNA assistance through [facility] via a grant program. She has been approved for 2 more hours of care, so she now has a caregiver for 3 hours on Tues and Fri and 2 hours on Wed. She is also currently receiving PT through [facility]. She says the main reasons that she does not want to pursue hospice at  this time is because she would not be able to continue PT and she would also lose her CNA, which she feels is most important at this time. Will continue to monitor.   HISTORY OF PRESENT ILLNESS:  [name] who resides at home alone. Palliative care team continues to follow patient. Will continue to visit patient monthly and PRN.  CODE STATUS: Full Code  ADVANCED DIRECTIVES: N MOST FORM: yes PPS: 50%   PHYSICAL EXAM:   VITALS: Today's Vitals   12/24/18 1415  BP: 105/71  Pulse: (!) 114  Resp: 20  Temp: (!) 97.3 F (36.3 C)  TempSrc: Temporal  SpO2: 90%  PainSc: 0-No pain    LUNGS: decreased breath sounds CARDIAC: Cor Tachy  EXTREMITIES: No edema  SKIN: Skin color, texture, turgor normal. No rashes or lesions  NEURO: Alert and oriented x 3, pleasant mood, generalized weakness, ambulatory with walker   (Duration of visit and documentation 75 minutes)   Daryl Eastern, RN BSN

## 2018-12-28 NOTE — Telephone Encounter (Signed)
Spoke with [name] and advised that we get a log of readings. Advised Angel to contact pulmonology about O2 stats.

## 2018-12-28 NOTE — Patient Outreach (Signed)
[facility]) Care Management  12/28/2018  [name] October 27, 1945 [id number]   Subjective: Telephone call to patient's home  / mobile number, spoke with patient, HIPAA verified.   Patient states she is doing okay, remembers speaking with this RNCM in the past, does not feel up to talking at this time, and requested call back at a later time.   Objective:Per KPN (Knowledge Performance Now, point of care tool) and chart review,patient hospitalized10/12/2018 - 11/06/2018 for COPD exacerbation,Mediastinal LAD and pulmonary nodules, hospitalized8/05/2018 - 09/05/2018 forAcute on chronic respiratory failure with hypoxia, COPD,Acute on chronic diastolic CHF (congestive heart failure). Patient also has a history of diabetes, hyperlipidemia, tobacco abuse, and OSA. Patient has been active with [place] [facility] in the past for COPDcomplex case management. Patient currently active with [name]. Has [name] for aide assistance. Lives alone. High risk for readmission. Limited support. Wears home 02 and has trilogy. Discharged from [place] 09/25/2018.     Assessment: Received Medicare [place] on10/16/2020. Referral source:Victoria Brewer. Referral reason:community nurse for complex care and disease management follow up calls and assess for further needs. Per referral source Primary MDdoes Transition of care, patientmay need more PersonalCareServicehours but did notwant to talk,request that questions be left for when she gets home. Screening follow up completed andwill follow patient for short term COPD case management.     Plan:  RNCMhassentpatient welcome letter packet with consent,will call patient foroutreachfollow up callwithin4 business days, assessment follow up,proceed with case closure within 10 business days if no return call. RNCM has sent primary MD  barrier/ involvement letter and routed assessment.       Braeson Rupe H. [name], BSN, [place] [facility] Telephonic CM Phone: [phone] Fax: [phone]

## 2018-12-28 NOTE — Telephone Encounter (Signed)
You should also contact pulmonary regarding the oxygen saturation.  Her sugar is elevated and that could be causing some sleepiness.  I would need more readings to adjust her insulin.

## 2018-12-30 ENCOUNTER — Ambulatory Visit: Payer: Self-pay | Admitting: *Deleted

## 2018-12-31 ENCOUNTER — Other Ambulatory Visit: Payer: Medicare Other | Admitting: *Deleted

## 2018-12-31 ENCOUNTER — Other Ambulatory Visit: Payer: Self-pay | Admitting: *Deleted

## 2018-12-31 DIAGNOSIS — Z515 Encounter for palliative care: Secondary | ICD-10-CM

## 2018-12-31 NOTE — Patient Outreach (Signed)
[facility]) Care Management  12/31/2018  [name] 07/12/1945 [id number]   Subjective: Telephone call to patient's home  / mobile number, no answer, left HIPAA compliant voicemail message, and requested call back.    Objective:Per KPN (Knowledge Performance Now, point of care tool) and chart review,patient hospitalized10/12/2018 - 11/06/2018 for COPD exacerbation,Mediastinal LAD and pulmonary nodules, hospitalized8/05/2018 - 09/05/2018 forAcute on chronic respiratory failure with hypoxia, COPD,Acute on chronic diastolic CHF (congestive heart failure). Patient also has a history of diabetes, hyperlipidemia, tobacco abuse, and OSA. Patient has been active with [facility] in the past for COPDcomplex case management. Patient currently active with [name]. Has [name] for aide assistance. Lives alone. High risk for readmission. Limited support. Wears home 02 and has trilogy. Discharged from [place] 09/25/2018.     Assessment: Received Medicare [place] on10/16/2020. Referral source:Victoria Brewer. Referral reason:community nurse for complex care and disease management follow up calls and assess for further needs. Per referral source Primary MDdoes Transition of care, patientmay need more PersonalCareServicehours but did notwant to talk,request that questions be left for when she gets home. Screening follow up completed,  will follow patient for short term COPD case management, and follow up pending patient contact.     Plan:  RNCM will send unsuccessful outreach  letter, [facility] pamphlet, will call patient for 3rd telephone outreach attempt, short COPD case management follow up, and proceed with case closure, within 10 business days if no return call.     Suraj Ramdass H. [name], BSN, [place] [facility] Telephonic CM Phone:  [phone] Fax: [phone]

## 2018-12-31 NOTE — Progress Notes (Signed)
COMMUNITY PALLIATIVE CARE RN NOTE  PATIENT NAME: [name] DOB: 12/29/1945 MRN: [id number]  PRIMARY CARE PROVIDER: Binnie Rail, MD  RESPONSIBLE PARTY:  Acct ID - Guarantor Home Phone Work Phone Relationship Acct Type  [id number] [facility] [phone]  Self P/F     [address], [place], [place], [place] [address]   Due to the COVID-19 crisis, this virtual check-in visit was done via telephone from my office and it was initiated and consent by this patient and or family.  PLAN OF CARE and INTERVENTION:  1. ADVANCE CARE PLANNING/GOALS OF CARE: Goal is for patient to remain in her home and avoid hospitalizations. She is a Full code. 2. PATIENT/CAREGIVER EDUCATION: Symptom Management 3. DISEASE STATUS: Received a message that patient was experiencing increased dyspnea. Virtual check-in visit completed via telephone. Patient states that she was having difficulty breathing and felt weak and did not want to move in fear that her dyspnea would worsen. She took her prn Xanax this am around 9a along with her routine medications. She also took her nebulizer treatment this afternoon around 2pm. She is unsure as to what caused an exacerbation. Her sats dropped to 80%, but with deep breathing she was able to get it to improve to 88%, but says that it has not increased past this. She was able to take another prn Xanax, which was effective. During conversation, her breathing continued to improve. She says that she does experience some coughing and congestion usually at night and in the mornings. Last pm and this morning, she was able to expectorate a significant amount of beige colored sputum, which she says has helped decrease coughing episodes since she has gotten this up. She is scheduled to have a Physical therapy session with [facility] in the morning. She says that she feels better now and does not feel that she needs an in person visit or needs to go to the hospital. Reinforced prn medications  and frequencies to help during times of dyspnea. She verbalized understanding. Will continue to monitor.   HISTORY OF PRESENT ILLNESS:  [name] who resides at home alone. Palliative care team continues to follow patient. Will continue to visit patient monthly and prn.  CODE STATUS: Full Code ADVANCED DIRECTIVES: N MOST FORM: yes PPS: 50%   (Duration of visit and documentation 30 minutes)   Daryl Eastern, RN BSN

## 2019-01-01 ENCOUNTER — Other Ambulatory Visit: Payer: Self-pay

## 2019-01-01 DIAGNOSIS — I959 Hypotension, unspecified: Secondary | ICD-10-CM | POA: Diagnosis not present

## 2019-01-01 DIAGNOSIS — I110 Hypertensive heart disease with heart failure: Secondary | ICD-10-CM | POA: Diagnosis not present

## 2019-01-01 DIAGNOSIS — J9621 Acute and chronic respiratory failure with hypoxia: Secondary | ICD-10-CM | POA: Diagnosis not present

## 2019-01-01 DIAGNOSIS — E1165 Type 2 diabetes mellitus with hyperglycemia: Secondary | ICD-10-CM | POA: Diagnosis not present

## 2019-01-01 DIAGNOSIS — J449 Chronic obstructive pulmonary disease, unspecified: Secondary | ICD-10-CM | POA: Diagnosis not present

## 2019-01-01 DIAGNOSIS — I5033 Acute on chronic diastolic (congestive) heart failure: Secondary | ICD-10-CM | POA: Diagnosis not present

## 2019-01-04 ENCOUNTER — Other Ambulatory Visit: Payer: Self-pay

## 2019-01-04 ENCOUNTER — Other Ambulatory Visit: Payer: Medicare Other | Admitting: *Deleted

## 2019-01-04 ENCOUNTER — Telehealth: Payer: Self-pay | Admitting: Internal Medicine

## 2019-01-04 ENCOUNTER — Other Ambulatory Visit: Payer: Self-pay | Admitting: *Deleted

## 2019-01-04 ENCOUNTER — Telehealth: Payer: Self-pay | Admitting: Primary Care

## 2019-01-04 ENCOUNTER — Telehealth: Payer: Self-pay

## 2019-01-04 ENCOUNTER — Encounter: Payer: Self-pay | Admitting: Primary Care

## 2019-01-04 ENCOUNTER — Ambulatory Visit (INDEPENDENT_AMBULATORY_CARE_PROVIDER_SITE_OTHER): Payer: Medicare Other | Admitting: Primary Care

## 2019-01-04 DIAGNOSIS — Z515 Encounter for palliative care: Secondary | ICD-10-CM

## 2019-01-04 DIAGNOSIS — J441 Chronic obstructive pulmonary disease with (acute) exacerbation: Secondary | ICD-10-CM

## 2019-01-04 MED ORDER — DOXYCYCLINE HYCLATE 100 MG PO TABS: 100.0000 mg | ORAL_TABLET | Freq: Two times a day (BID) | ORAL | 0 refills | Status: DC

## 2019-01-04 MED ORDER — PREDNISONE 10 MG PO TABS: ORAL_TABLET | ORAL | 0 refills | Status: DC

## 2019-01-04 MED ORDER — GUAIFENESIN ER 600 MG PO TB12: 600.0000 mg | ORAL_TABLET | Freq: Two times a day (BID) | ORAL | 1 refills | Status: AC

## 2019-01-04 MED ORDER — ALPRAZOLAM 1 MG PO TABS: 1.0000 mg | ORAL_TABLET | Freq: Three times a day (TID) | ORAL | 0 refills | Status: DC | PRN

## 2019-01-04 NOTE — Telephone Encounter (Signed)
We can increase to 1 mg 3 times a day.  This can cause sedation so we just need to be careful.  If 1 mg is too much we can decrease it to 0.75  She can take 2 of what she has now.  If she does not feel that she needs it 1 mg she can take only half.  [facility]

## 2019-01-04 NOTE — Telephone Encounter (Signed)
FYI Beth  

## 2019-01-04 NOTE — Telephone Encounter (Signed)
Copied from [place] [id number]. Topic: General - Other >> Jan 04, 2019  3:44 PM Lew[place] Quan A wrote: Reason for CRM: Monishia with Palliative care in [place] called to say that she is with the patient who had a visit virtually with her [place] and she recommended that her ALPRAZolam Duanne Moron) 0.5 MG tablet get increased to 1 MG. Please advise any questions please call Monishia at Ph# [phone] or patient at Ph# [phone]

## 2019-01-04 NOTE — Telephone Encounter (Signed)
Called and spoke to patient. Patient stated she is having increased shortness of breath with activity. Patient stated that her oxygen has been dropping with movement but that she recovers in a couple of minutes. Patient reports using 5 L of oxygen via nasal cannula. Patient denies any fever, reports chest congestion and a productive cough that is beige in color.  Patient shared that she doesn't have a device with video availability but that she could do a telephone visit. Scheduled patient for a televisit with NP for this afternoon. Nothing further needed at this time.

## 2019-01-04 NOTE — Patient Instructions (Signed)
COPD exacerbation: - RX doxycycline 1 tab twice daily x 7 days - Prednisone taper (40mg  x 3 days; 30mg  x 3 days; then resume 20mg  daily) - Continue Trelegy Ellipta 1 puff daily; albuterol 2 puffs every 4-6 hours - Add mucinex 600mg  twice daily  - If no improvement needs CXR and labs (CBC, bmet and bnp) - Low suspicion for Covid-19   Acute on chronic respiratory distress with hypoxia - Recommend increasing oxygen to 6-7L - Monitor O2 level and titrate to keep O2 >88-90%  Follow-up - 7-10 days teleivist with Beth NP; sooner if symptoms worsen (ED if O2 sustaining <88% despite increasing oxygen)

## 2019-01-04 NOTE — Telephone Encounter (Signed)
Ok, good to know. I already told her to go up to 6-7 L to keep O2 > 88-90% on exertion.

## 2019-01-04 NOTE — Patient Outreach (Signed)
[facility]) Care Management  01/04/2019  [name] 1946/01/02 [id number]   Subjective:Telephone call to patient's home / mobile number, spoke with patient, and HIPAA verified.  States she currently on the other line, requested call back at a later time, and/ or she will call RNCM back at a later time.    Objective:Per KPN (Knowledge Performance Now, point of care tool) and chart review,patient hospitalized10/12/2018 - 11/06/2018 for COPD exacerbation,Mediastinal LAD and pulmonary nodules, hospitalized8/05/2018 - 09/05/2018 forAcute on chronic respiratory failure with hypoxia, COPD,Acute on chronic diastolic CHF (congestive heart failure). Patient also has a history of diabetes, hyperlipidemia, tobacco abuse, and OSA. Patient has been active with [facility] [facility] in the past for COPDcomplex case management. Patient currently active with [name]. Has [name] for aide assistance. Lives alone. High risk for readmission. Limited support. Wears home 02 and has trilogy. Discharged from [place] 09/25/2018.     Assessment: Received Medicare [place] on10/16/2020. Referral source:[name]. Referral reason:community nurse for complex care and disease management follow up calls and assess for further needs. Per referral source Primary MDdoes Transition of care, patientmay need more PersonalCareServicehours but did notwant to talk,request that questions be left for when she gets home. Screening follow up completed,  will follow patient for short term COPD case management, and follow up pending patient contact.     Plan:RNCM has sent unsuccessful outreach  letter, [facility] pamphlet, and will proceed with case closure, within 10 business days if no return call.     [name], BSN, [place] [facility] Telephonic CM Phone:  [phone] Fax: [phone]

## 2019-01-04 NOTE — Progress Notes (Signed)
Virtual Visit via Telephone Note  I connected with [name] on 01/04/19 at  2:00 PM EST by telephone and verified that I am speaking with the correct person using two identifiers.  Location: Patient: Home Provider: Office   I discussed the limitations, risks, security and privacy concerns of performing an evaluation and management service by telephone and the availability of in person appointments. I also discussed with the patient that there may be a patient responsible charge related to this service. The patient expressed understanding and agreed to proceed.   History of Present Illness: 73 year old female, current smoker. PMH significant for COPD, chronic respiratory failure, bronchiectasis, GERD, acute on chronic diastolic CHF, mediastinal adenopathy/mass, pulmonary hypertension, diabetes, anxiety. Patient of Dr. [name], last seen by pulmonary NP 12/08/18. Maintained on Trelegy Ellipta and 20mg  daily prednisone. Treated for COPD exacerbation in early November with Zpack and prednisone 40mg  x 1 week. Ordered for oxygen concentrator with capacity of 10L. Referred back to hospice. Continue to encourage smoking cessation.    01/04/2019 Patient contacted today for acute televisit with reports of increased shortness of breath with activity for two days. Associated chest congestion and productive cough. She is able to get up some beige mucus. She is not currently taking mucinex. She is on 5L nasal canula. Her oxygen level has been dropping into mid 70s with movement but recovers within a few minutes. She takes Xanax 0.5mg  three times a day and states that it helps with her breathing. She is receiving palliative care services and has a nursing aid that comes to her house twice a week. She has not left her house and has no known sick contacts. Denies fever, chills, sweats, lethargy, chest tightness, wheezing, change in smell or taste.  Observations/Objective:  - No significant shortness of  breath, wheezing or cough noted during phone conversation  - Able to speak in full sentences with mild dyspnea   Significant testing reviewed:  11/02/2018-SARS-CoV-2-negative  08/27/2018-echocardiogram- LV ejection fraction 60 to 65%, increased left ventricular wall thickness, impaired relaxation, RV has normal systolic function, cavity was normal, mild diastolic dysfunction  [id number] function test- FVC 1.46 (64% predicted), FEV1 0.69 (39% predicted), ratio 47  10/30/2016-CT chest with contrast- stable appearance of mediastinal adenopathy, bilateral hilar adenopathy is identified, small nonspecific pulmonary nodules are unchanged from previous exam, emphysema  Assessment and Plan:  COPD exacerbation: - Increased shortness of breath with associated productive cough x2 days - RX doxycycline 1 tab twice daily x 7 days; prednisone taper (40mg  x 3 days; 30mg  x 3 days; then resume 20mg  daily) - Continue Trelegy Ellipta 1 puff daily; prn albuterol hfa 2 puffs every 4-6 hours - Add mucinex 600mg  twice daily  - May benefit from increasing Xanax to 0.75-1mg  TID under palliative/hospic care supervision for dyspnea and anxiety symptoms (prescribed by PCP) - If no improvement needs CXR and labs (CBC, bmet and bnp) - Low suspicion for Covid-19   Acute on chronic respiratory distress with hypoxia - Maintained on 5L [place], desaturating into 70s with exertion but recovers within a few minutes. Likely related to acute COPD exacerbation. Recommend patient increased oxygen to 6-6.5L. Monitor O2 level and titrate to keep O2 >88-90%   Follow Up Instructions:   - 7-10 days follow-up televisit  I discussed the assessment and treatment plan with the patient. The patient was provided an opportunity to ask questions and all were answered. The patient agreed with the plan and demonstrated an understanding of the instructions.   The  patient was advised to call back or seek an in-person evaluation if the  symptoms worsen or if the condition fails to improve as anticipated.  I provided 18 minutes of non-face-to-face time during this encounter.   Martyn Ehrich, NP

## 2019-01-05 ENCOUNTER — Telehealth: Payer: Self-pay | Admitting: Internal Medicine

## 2019-01-05 ENCOUNTER — Telehealth: Payer: Self-pay

## 2019-01-05 DIAGNOSIS — J449 Chronic obstructive pulmonary disease, unspecified: Secondary | ICD-10-CM | POA: Diagnosis not present

## 2019-01-05 DIAGNOSIS — I5033 Acute on chronic diastolic (congestive) heart failure: Secondary | ICD-10-CM | POA: Diagnosis not present

## 2019-01-05 DIAGNOSIS — I959 Hypotension, unspecified: Secondary | ICD-10-CM | POA: Diagnosis not present

## 2019-01-05 DIAGNOSIS — E1165 Type 2 diabetes mellitus with hyperglycemia: Secondary | ICD-10-CM | POA: Diagnosis not present

## 2019-01-05 DIAGNOSIS — I110 Hypertensive heart disease with heart failure: Secondary | ICD-10-CM | POA: Diagnosis not present

## 2019-01-05 DIAGNOSIS — J9621 Acute and chronic respiratory failure with hypoxia: Secondary | ICD-10-CM | POA: Diagnosis not present

## 2019-01-05 NOTE — Telephone Encounter (Signed)
Please advise. Contact pulm?

## 2019-01-05 NOTE — Telephone Encounter (Signed)
[name] with [facility] reports she saw pt. This afternoon. Reports O 2 saturation ranged from 80%-91%. Pt. Denies any shortness of breath. Lungs clear to auscultation. Alert, oriented. Pt. Reports she "feels fine." Ankle edema increased from last visit 01/01/19 - right ankle 25.5 cm ( increased 3 cm)  Ankle 23.5 cm (increased 2 cm). If there are any new orders, please call [name] at [phone].

## 2019-01-05 NOTE — Telephone Encounter (Signed)
Error

## 2019-01-05 NOTE — Telephone Encounter (Signed)
Pt states she has already addressed with with pulm and has had antibiotics and prednisone sent in that her nurse has gone to pick up. She will start that today.

## 2019-01-05 NOTE — Progress Notes (Signed)
COMMUNITY PALLIATIVE CARE RN NOTE  PATIENT NAME: [name] DOB: 1945/03/16 MRN: [id number]  PRIMARY CARE PROVIDER: Binnie Rail, MD  RESPONSIBLE PARTY:  Acct ID - Guarantor Home Phone Work Phone Relationship Acct Type  [id number] [facility] [phone]  Self P/F     [address], [address], [place], [place] [address]   Covid-19 Pre-screening Negative  PLAN OF CARE and INTERVENTION:  1. ADVANCE CARE PLANNING/GOALS OF CARE: Goal is for patient to remain at home. She is a Full code. 2. PATIENT/CAREGIVER EDUCATION: Symptom Management 3. DISEASE STATUS: Received a phone call from patient stating that she is very short of breath. She woke up this morning and realized that her oxygen had fallen off during the night. She is unsure how long she was without it. Her oxygen level was 75% on 6L/min via [place] when she initially placed the oxygen back on. I advised her to take a PRN Xanax and use her nebulizer. She verbalized understanding. PRN RN visit made to check on patient. Upon arrival, she is sitting up on her rollator walker and is short of breath. She had just transferred to her walker from the bedside commode. She had me to turn her fan on, which helped along with purse lip breathing. She has just completed a telehealth visit with Geraldo Pitter, NP at [facility]. She prescribed patient Doxycycline, Prednisone taper and Mucinex. She recommended patient increase her oxygen up to 6- 6.5L per her documentation. She was already on 6L when I arrived so I increased the concentrator to 6.5L and her oxygen level came up from 84% to 88-89%. She also recommended that her Xanax be increased to 0.75mg  - 1mg  and wanted patient to have me contact Dr. Quay Burow to request. I contacted Dr. Quay Burow to advise of recommendation and she increased Xanax to 1 mg TID prn, but cautioned patient about sedation and stated that she could take 0.75 or 0.5 mg if she felt 1 mg was not needed. She has edema noted to bilateral  legs/feet bilaterally. Towards end of visit, breathing was much improved. She is appreciative. Will continue to monitor.   HISTORY OF PRESENT ILLNESS:  This is a 73 yo patient who resides at home alone. Palliative care team continues to follow patient. Will continue to visit patient monthly and PRN.  CODE STATUS: Full Code ADVANCED DIRECTIVES: N MOST FORM: yes PPS: 50%   PHYSICAL EXAM:   VITALS: Today's Vitals   01/04/19 1443  BP: 103/66  Pulse: (!) 119  Resp: (!) 24  Temp: (!) 97.3 F (36.3 C)  TempSrc: Temporal  SpO2: (!) 85%  PainSc: 0-No pain    LUNGS: decreased breath sounds CARDIAC: Cor Tachy EXTREMITIES: Edema noted to bilateral lower extremities SKIN: Exposed skin is dry and intact  NEURO: Alert and oriented x 3, generalized weakness, ambulatory with walker   (Duration of visit and documentation 90 minutes)   Daryl Eastern, RN BSN

## 2019-01-05 NOTE — Telephone Encounter (Signed)
Call patient - is her SOB worse?  Is her oxygenation worse?    If her ankles are more swollen and one of the above or both of the above are worse she should take an extra lasix today to get rid of extra fluid.

## 2019-01-05 NOTE — Telephone Encounter (Signed)
LVM for pt to call back in regards to message below.  

## 2019-01-05 NOTE — Telephone Encounter (Signed)
Spoke with patient, she is aware. Will sign off.

## 2019-01-05 NOTE — Telephone Encounter (Signed)
Pt calling back. Please advise.

## 2019-01-05 NOTE — Telephone Encounter (Signed)
Pt aware of response below and expressed understanding.  

## 2019-01-09 ENCOUNTER — Other Ambulatory Visit: Payer: Self-pay | Admitting: Internal Medicine

## 2019-01-13 ENCOUNTER — Other Ambulatory Visit: Payer: Self-pay

## 2019-01-13 ENCOUNTER — Telehealth: Payer: Self-pay | Admitting: Emergency Medicine

## 2019-01-13 ENCOUNTER — Ambulatory Visit (INDEPENDENT_AMBULATORY_CARE_PROVIDER_SITE_OTHER): Payer: Medicare Other | Admitting: Primary Care

## 2019-01-13 ENCOUNTER — Encounter: Payer: Self-pay | Admitting: Primary Care

## 2019-01-13 ENCOUNTER — Ambulatory Visit: Payer: Self-pay | Admitting: *Deleted

## 2019-01-13 ENCOUNTER — Other Ambulatory Visit: Payer: Medicare Other | Admitting: Licensed Clinical Social Worker

## 2019-01-13 ENCOUNTER — Telehealth: Payer: Self-pay | Admitting: Licensed Clinical Social Worker

## 2019-01-13 DIAGNOSIS — J441 Chronic obstructive pulmonary disease with (acute) exacerbation: Secondary | ICD-10-CM | POA: Diagnosis not present

## 2019-01-13 DIAGNOSIS — E1165 Type 2 diabetes mellitus with hyperglycemia: Secondary | ICD-10-CM | POA: Diagnosis not present

## 2019-01-13 DIAGNOSIS — Z515 Encounter for palliative care: Secondary | ICD-10-CM

## 2019-01-13 DIAGNOSIS — I5033 Acute on chronic diastolic (congestive) heart failure: Secondary | ICD-10-CM | POA: Diagnosis not present

## 2019-01-13 DIAGNOSIS — J449 Chronic obstructive pulmonary disease, unspecified: Secondary | ICD-10-CM | POA: Diagnosis not present

## 2019-01-13 DIAGNOSIS — I110 Hypertensive heart disease with heart failure: Secondary | ICD-10-CM | POA: Diagnosis not present

## 2019-01-13 DIAGNOSIS — I959 Hypotension, unspecified: Secondary | ICD-10-CM | POA: Diagnosis not present

## 2019-01-13 DIAGNOSIS — J9621 Acute and chronic respiratory failure with hypoxia: Secondary | ICD-10-CM | POA: Diagnosis not present

## 2019-01-13 NOTE — Telephone Encounter (Signed)
Increase lantus to 32 units at bedtime.  She needs to call back every couple of days with her sugars so we can adjust her insulin

## 2019-01-13 NOTE — Progress Notes (Signed)
Virtual Visit via Telephone Note  I connected with [name] on 01/13/19 at  4:00 PM EST by telephone and verified that I am speaking with the correct person using two identifiers.  Location: Patient: Home Provider: Office   I discussed the limitations, risks, security and privacy concerns of performing an evaluation and management service by telephone and the availability of in person appointments. I also discussed with the patient that there may be a patient responsible charge related to this service. The patient expressed understanding and agreed to proceed.   History of Present Illness: 73 year old female, current smoker. PMH significant for COPD, chronic respiratory failure, bronchiectasis, GERD, acute on chronic diastolic CHF, mediastinal adenopathy/mass, pulmonary hypertension, diabetes, anxiety. Patient of Dr. [name], last seen by pulmonary NP 12/08/18. Maintained on Trelegy Ellipta and 20mg  daily prednisone. Treated for COPD exacerbation in early November with Zpack and prednisone 40mg  x 1 week. Ordered for oxygen concentrator with capacity of 10L. Referred back to hospice. Continue to encourage smoking cessation.    01/04/2019 Patient contacted today for acute televisit with reports of increased shortness of breath with activity for two days. Associated chest congestion and productive cough. She is able to get up some beige mucus. She is not currently taking mucinex. She is on 5L nasal canula. Her oxygen level has been dropping into mid 70s with movement but recovers within a few minutes. She takes Xanax 0.5mg  three times a day and states that it helps with her breathing. She is receiving palliative care services and has a nursing aid that comes to her house twice a week. She has not left her house and has no known sick contacts. Denies fever, chills, sweats, lethargy, chest tightness, wheezing, change in smell or taste.  01/13/2019 Patient contacted today for 10 day follow-up  teleivist for COPD exacerbation. She completed prednisone taper yesterday and has one day left of doxycycline. She reports that her breathing and cough have improved. She is feeling well with no complaints other than elevated glucose reading yesterday. Reports BS >500 yesterday. BS today down to 204. Her PCP Dr. Quay Burow increase insulin dose at night. Maintained on Trelegy 1 puff daily.  Continues as needed albuterol rescue inhaler.  No reported oxygen desaturation, continues 5 L nasal cannula.  Denies fever, chest pain, chest tightness, cough, wheezing.   Observations/Objective:  -No shortness of breath, wheezing or cough noted during phone conversation   Assessment and Plan:  COPD exacerbation - Clinically improving after doxycycline and prednisone taper - Continue Trelegy 1 puff daily; albuterol HFA 2 puffs every 4 to 6 hours as needed for shortness of breath or wheezing - Continue prednisone 20 mg daily  Hyperglycemia: - HX diabetes - Blood sugar was reported >500 on last day of prednisone taper. Her PCP increased bedtime insulin. BS returns to 200s.  - Will need to monitor closely if patient has another COPD exacerbation requiring steriod taper   Follow Up Instructions:   -As needed if symptoms return  I discussed the assessment and treatment plan with the patient. The patient was provided an opportunity to ask questions and all were answered. The patient agreed with the plan and demonstrated an understanding of the instructions.   The patient was advised to call back or seek an in-person evaluation if the symptoms worsen or if the condition fails to improve as anticipated.  I provided 12 minutes of non-face-to-face time during this encounter.   Martyn Ehrich, NP

## 2019-01-13 NOTE — Telephone Encounter (Signed)
Patient phoned Palliative Care stating that she needed batteries for her glucose meter.  SW left a vm for her.

## 2019-01-13 NOTE — Progress Notes (Signed)
COMMUNITY PALLIATIVE CARE SW NOTE  PATIENT NAME: [name] DOB: 10-11-1945 MRN: [id number]  PRIMARY CARE PROVIDER: Binnie Rail, MD  RESPONSIBLE PARTY:  Acct ID - Guarantor Home Phone Work Phone Relationship Acct Type  [id number] - [name],Toni [phone]  Self P/F     [name], [name], [name], [name] [address]   Due to the COVID-19 crisis, this virtual check-in visit was done via telephone from my office and it was initiated and consent by this patientand orfamily.  PLAN OF CARE and INTERVENTIONS:             1. GOALS OF CARE/ ADVANCE CARE PLANNING:  Goal is for patient to remain in her apartment.  She is a Full Code. 2. SOCIAL/EMOTIONAL/SPIRITUAL ASSESSMENT/ INTERVENTIONS:  SW conducted a virtual check-in visit with patient in her apartment.  She reports her sugar was high yesterday.  Her niece had not returned with batteries for her glucose meter, so patient phoned Palliative Care.  SW followed up with her and her niece finally provided the batteries.  She reports having a headache recently, which she normally does not get.  [name] stated she was feeling much better and that PT was scheduled to meet with her today at 3pm. 3. PATIENT/CAREGIVER EDUCATION/ COPING:  Patient copes by problem-solving. 4. PERSONAL EMERGENCY PLAN:  Patient will contact Palliative Care or her MD. 5. COMMUNITY RESOURCES COORDINATION/ HEALTH CARE NAVIGATION:  Patient has CNA services through a grant. 6. FINANCIAL/LEGAL CONCERNS/INTERVENTIONS:  Patient is on a fixed income.     SOCIAL HX:  Social History   Tobacco Use  . Smoking status: Current Every Day Smoker    Packs/day: 0.25    Years: 50.00    Pack years: 12.50    Types: Cigarettes  . Smokeless tobacco: Never Used  . Tobacco comment: 3-4 cigaretters per day 11/20/2017   Substance Use Topics  . Alcohol use: Yes    Alcohol/week: 0.0 standard drinks    Comment: occasional    CODE STATUS:  Full Code ADVANCED DIRECTIVES: N MOST FORM  COMPLETE:  N HOSPICE EDUCATION PROVIDED:  N PPS:  Patient has a normal appetite.  She uses a walker to ambulate. Duration of visit and documentation:  30 minutes.      Toni Corn Leyda Vanderwerf, LCSW

## 2019-01-13 NOTE — Telephone Encounter (Signed)
Pt called and stated she in having elevated blood sugars. She stated she cant remember the dates and times but have been running 212,299 and 305 over the past few days. She stated last night it was 437 and this morning 202 fasting. She had a cup of coffee with some sugar and this afternoon it was 337. She checked it around 2:15 and it was 376. She is wondering what you would like her to do. Please advise thanks.

## 2019-01-13 NOTE — Telephone Encounter (Signed)
Pt aware and expressed understanding.  

## 2019-01-13 NOTE — Patient Instructions (Addendum)
  COPD: - No changes today - Continue Trelegy 1 puff daily; albuterol HFA 2 puffs every 4 to 6 hours as needed for shortness of breath or wheezing - Continue prednisone 20 mg daily - Continue to monitor blood glucose and if readings are elevated notify PCP or our office  Follow-up - Notify office with respiratory symptoms return or worsen

## 2019-01-13 NOTE — Telephone Encounter (Signed)
Patient is calling to report elevated glucose levels-and they adjusted her insulin. She had been instructed to watch levels and report. Patient is taking Prednisone 20 mg daily for her COPD.  Patient has been recording levels it was over 500 in the afternoon and over 300 in the morning.( Patient states her batteries died- but she has replaced them and is able to monitor.) 337 now- patient reports she has only had coffee today- she did have sugar in the coffee. Patient states she is afraid to eat- it will make her glucose go up. Call to office for appointment Reason for Disposition . [1] Blood glucose > 300 mg/dL (16.7 mmol/L) AND [2] two or more times in a row  Answer Assessment - Initial Assessment Questions 1. BLOOD GLUCOSE: "What is your blood glucose level?"      202 at 10:00 am- fasting 2. ONSET: "When did you check the blood glucose?"     This morning (and at night) 3. USUAL RANGE: "What is your glucose level usually?" (e.g., usual fasting morning value, usual evening value)     Normal fasting- 100's-80's 4. KETONES: "Do you check for ketones (urine or blood test strips)?" If yes, ask: "What does the test show now?"      no 5. TYPE 1 or 2:  "Do you know what type of diabetes you have?"  (e.g., Type 1, Type 2, Gestational; doesn't know)      Type 2 6. INSULIN: "Do you take insulin?" "What type of insulin(s) do you use? What is the mode of delivery? (syringe, pen; injection or pump)?"      Lantus 26 units at night 7. DIABETES PILLS: "Do you take any pills for your diabetes?" If yes, ask: "Have you missed taking any pills recently?"     No oral 8. OTHER SYMPTOMS: "Do you have any symptoms?" (e.g., fever, frequent urination, difficulty breathing, dizziness, weakness, vomiting)     no 9. PREGNANCY: "Is there any chance you are pregnant?" "When was your last menstrual period?"     n/a  Protocols used: DIABETES - HIGH BLOOD SUGAR-A-AH

## 2019-01-14 ENCOUNTER — Telehealth: Payer: Self-pay

## 2019-01-14 DIAGNOSIS — I110 Hypertensive heart disease with heart failure: Secondary | ICD-10-CM | POA: Diagnosis not present

## 2019-01-14 DIAGNOSIS — J9621 Acute and chronic respiratory failure with hypoxia: Secondary | ICD-10-CM | POA: Diagnosis not present

## 2019-01-14 DIAGNOSIS — E1165 Type 2 diabetes mellitus with hyperglycemia: Secondary | ICD-10-CM | POA: Diagnosis not present

## 2019-01-14 DIAGNOSIS — I5033 Acute on chronic diastolic (congestive) heart failure: Secondary | ICD-10-CM | POA: Diagnosis not present

## 2019-01-14 DIAGNOSIS — J449 Chronic obstructive pulmonary disease, unspecified: Secondary | ICD-10-CM | POA: Diagnosis not present

## 2019-01-14 DIAGNOSIS — I959 Hypotension, unspecified: Secondary | ICD-10-CM | POA: Diagnosis not present

## 2019-01-14 NOTE — Telephone Encounter (Signed)
Copied from [facility] [id number]. Topic: Quick Communication - Home Health Verbal Orders >> Jan 14, 2019  9:27 AM Virl Axe D wrote: Caller/Agency: Jeff,Pt/Advanced Callback Number: [phone] VM Requesting OT/PT/Skilled Nursing/Social Work/Speech Therapy: PT Frequency: 1 day 1 for reassessment for further need

## 2019-01-14 NOTE — Telephone Encounter (Signed)
Ok for verbal 

## 2019-01-16 DIAGNOSIS — R0902 Hypoxemia: Secondary | ICD-10-CM | POA: Diagnosis not present

## 2019-01-16 DIAGNOSIS — E1165 Type 2 diabetes mellitus with hyperglycemia: Secondary | ICD-10-CM | POA: Diagnosis not present

## 2019-01-16 DIAGNOSIS — R457 State of emotional shock and stress, unspecified: Secondary | ICD-10-CM | POA: Diagnosis not present

## 2019-01-16 DIAGNOSIS — R0689 Other abnormalities of breathing: Secondary | ICD-10-CM | POA: Diagnosis not present

## 2019-01-16 DIAGNOSIS — R0602 Shortness of breath: Secondary | ICD-10-CM | POA: Diagnosis not present

## 2019-01-18 NOTE — Telephone Encounter (Signed)
Called and spoke with Merry Proud. Verbal orders given.

## 2019-01-20 ENCOUNTER — Inpatient Hospital Stay (HOSPITAL_COMMUNITY)
Admission: EM | Admit: 2019-01-20 | Discharge: 2019-02-02 | DRG: 189 | Disposition: A | Discharge status: Skilled Nursing Facility | Payer: Medicare Other | Attending: Internal Medicine | Admitting: Internal Medicine

## 2019-01-20 ENCOUNTER — Emergency Department (HOSPITAL_COMMUNITY): Payer: Medicare Other

## 2019-01-20 DIAGNOSIS — R0902 Hypoxemia: Secondary | ICD-10-CM | POA: Diagnosis not present

## 2019-01-20 DIAGNOSIS — Z7189 Other specified counseling: Secondary | ICD-10-CM | POA: Diagnosis not present

## 2019-01-20 DIAGNOSIS — Z794 Long term (current) use of insulin: Secondary | ICD-10-CM | POA: Diagnosis not present

## 2019-01-20 DIAGNOSIS — J302 Other seasonal allergic rhinitis: Secondary | ICD-10-CM | POA: Diagnosis present

## 2019-01-20 DIAGNOSIS — I509 Heart failure, unspecified: Secondary | ICD-10-CM | POA: Diagnosis not present

## 2019-01-20 DIAGNOSIS — J9611 Chronic respiratory failure with hypoxia: Secondary | ICD-10-CM | POA: Diagnosis not present

## 2019-01-20 DIAGNOSIS — J9602 Acute respiratory failure with hypercapnia: Secondary | ICD-10-CM | POA: Diagnosis not present

## 2019-01-20 DIAGNOSIS — E860 Dehydration: Secondary | ICD-10-CM | POA: Diagnosis present

## 2019-01-20 DIAGNOSIS — Z515 Encounter for palliative care: Secondary | ICD-10-CM

## 2019-01-20 DIAGNOSIS — J439 Emphysema, unspecified: Secondary | ICD-10-CM | POA: Diagnosis present

## 2019-01-20 DIAGNOSIS — R0602 Shortness of breath: Secondary | ICD-10-CM

## 2019-01-20 DIAGNOSIS — Z743 Need for continuous supervision: Secondary | ICD-10-CM | POA: Diagnosis not present

## 2019-01-20 DIAGNOSIS — G4733 Obstructive sleep apnea (adult) (pediatric): Secondary | ICD-10-CM | POA: Diagnosis not present

## 2019-01-20 DIAGNOSIS — R0689 Other abnormalities of breathing: Secondary | ICD-10-CM | POA: Diagnosis not present

## 2019-01-20 DIAGNOSIS — R52 Pain, unspecified: Secondary | ICD-10-CM | POA: Diagnosis not present

## 2019-01-20 DIAGNOSIS — J441 Chronic obstructive pulmonary disease with (acute) exacerbation: Secondary | ICD-10-CM | POA: Diagnosis present

## 2019-01-20 DIAGNOSIS — D509 Iron deficiency anemia, unspecified: Secondary | ICD-10-CM | POA: Diagnosis present

## 2019-01-20 DIAGNOSIS — Z66 Do not resuscitate: Secondary | ICD-10-CM | POA: Diagnosis present

## 2019-01-20 DIAGNOSIS — Q2549 Other congenital malformations of aorta: Secondary | ICD-10-CM | POA: Diagnosis not present

## 2019-01-20 DIAGNOSIS — Z20822 Contact with and (suspected) exposure to covid-19: Secondary | ICD-10-CM | POA: Diagnosis present

## 2019-01-20 DIAGNOSIS — I341 Nonrheumatic mitral (valve) prolapse: Secondary | ICD-10-CM | POA: Diagnosis present

## 2019-01-20 DIAGNOSIS — I5033 Acute on chronic diastolic (congestive) heart failure: Secondary | ICD-10-CM | POA: Diagnosis present

## 2019-01-20 DIAGNOSIS — F411 Generalized anxiety disorder: Secondary | ICD-10-CM | POA: Diagnosis present

## 2019-01-20 DIAGNOSIS — J479 Bronchiectasis, uncomplicated: Secondary | ICD-10-CM | POA: Diagnosis present

## 2019-01-20 DIAGNOSIS — J9622 Acute and chronic respiratory failure with hypercapnia: Secondary | ICD-10-CM | POA: Diagnosis present

## 2019-01-20 DIAGNOSIS — J9600 Acute respiratory failure, unspecified whether with hypoxia or hypercapnia: Secondary | ICD-10-CM | POA: Diagnosis not present

## 2019-01-20 DIAGNOSIS — Z751 Person awaiting admission to adequate facility elsewhere: Secondary | ICD-10-CM

## 2019-01-20 DIAGNOSIS — I313 Pericardial effusion (noninflammatory): Secondary | ICD-10-CM | POA: Diagnosis present

## 2019-01-20 DIAGNOSIS — Z90710 Acquired absence of both cervix and uterus: Secondary | ICD-10-CM

## 2019-01-20 DIAGNOSIS — R000 Tachycardia, unspecified: Secondary | ICD-10-CM | POA: Diagnosis not present

## 2019-01-20 DIAGNOSIS — J9612 Chronic respiratory failure with hypercapnia: Secondary | ICD-10-CM | POA: Diagnosis not present

## 2019-01-20 DIAGNOSIS — E870 Hyperosmolality and hypernatremia: Secondary | ICD-10-CM | POA: Diagnosis present

## 2019-01-20 DIAGNOSIS — Z888 Allergy status to other drugs, medicaments and biological substances status: Secondary | ICD-10-CM

## 2019-01-20 DIAGNOSIS — I10 Essential (primary) hypertension: Secondary | ICD-10-CM | POA: Diagnosis not present

## 2019-01-20 DIAGNOSIS — R627 Adult failure to thrive: Secondary | ICD-10-CM

## 2019-01-20 DIAGNOSIS — J90 Pleural effusion, not elsewhere classified: Secondary | ICD-10-CM | POA: Diagnosis not present

## 2019-01-20 DIAGNOSIS — Z8541 Personal history of malignant neoplasm of cervix uteri: Secondary | ICD-10-CM

## 2019-01-20 DIAGNOSIS — Z6837 Body mass index (BMI) 37.0-37.9, adult: Secondary | ICD-10-CM

## 2019-01-20 DIAGNOSIS — J449 Chronic obstructive pulmonary disease, unspecified: Secondary | ICD-10-CM | POA: Diagnosis not present

## 2019-01-20 DIAGNOSIS — J9610 Chronic respiratory failure, unspecified whether with hypoxia or hypercapnia: Secondary | ICD-10-CM | POA: Diagnosis present

## 2019-01-20 DIAGNOSIS — E118 Type 2 diabetes mellitus with unspecified complications: Secondary | ICD-10-CM | POA: Diagnosis not present

## 2019-01-20 DIAGNOSIS — Z79899 Other long term (current) drug therapy: Secondary | ICD-10-CM

## 2019-01-20 DIAGNOSIS — F419 Anxiety disorder, unspecified: Secondary | ICD-10-CM | POA: Diagnosis not present

## 2019-01-20 DIAGNOSIS — I959 Hypotension, unspecified: Secondary | ICD-10-CM | POA: Diagnosis not present

## 2019-01-20 DIAGNOSIS — R279 Unspecified lack of coordination: Secondary | ICD-10-CM | POA: Diagnosis not present

## 2019-01-20 DIAGNOSIS — F17210 Nicotine dependence, cigarettes, uncomplicated: Secondary | ICD-10-CM | POA: Diagnosis present

## 2019-01-20 DIAGNOSIS — E876 Hypokalemia: Secondary | ICD-10-CM | POA: Diagnosis present

## 2019-01-20 DIAGNOSIS — I4510 Unspecified right bundle-branch block: Secondary | ICD-10-CM | POA: Diagnosis present

## 2019-01-20 DIAGNOSIS — E1165 Type 2 diabetes mellitus with hyperglycemia: Secondary | ICD-10-CM | POA: Diagnosis present

## 2019-01-20 DIAGNOSIS — R918 Other nonspecific abnormal finding of lung field: Secondary | ICD-10-CM | POA: Diagnosis present

## 2019-01-20 DIAGNOSIS — Z9981 Dependence on supplemental oxygen: Secondary | ICD-10-CM | POA: Diagnosis not present

## 2019-01-20 DIAGNOSIS — Z209 Contact with and (suspected) exposure to unspecified communicable disease: Secondary | ICD-10-CM | POA: Diagnosis not present

## 2019-01-20 DIAGNOSIS — Z7952 Long term (current) use of systemic steroids: Secondary | ICD-10-CM

## 2019-01-20 DIAGNOSIS — J471 Bronchiectasis with (acute) exacerbation: Secondary | ICD-10-CM | POA: Diagnosis not present

## 2019-01-20 DIAGNOSIS — M6281 Muscle weakness (generalized): Secondary | ICD-10-CM | POA: Diagnosis not present

## 2019-01-20 DIAGNOSIS — D638 Anemia in other chronic diseases classified elsewhere: Secondary | ICD-10-CM | POA: Diagnosis present

## 2019-01-20 DIAGNOSIS — Z800 Family history of malignant neoplasm of digestive organs: Secondary | ICD-10-CM

## 2019-01-20 DIAGNOSIS — J9621 Acute and chronic respiratory failure with hypoxia: Secondary | ICD-10-CM | POA: Diagnosis present

## 2019-01-20 DIAGNOSIS — R5381 Other malaise: Secondary | ICD-10-CM | POA: Diagnosis not present

## 2019-01-20 DIAGNOSIS — E785 Hyperlipidemia, unspecified: Secondary | ICD-10-CM | POA: Diagnosis not present

## 2019-01-20 DIAGNOSIS — I272 Pulmonary hypertension, unspecified: Secondary | ICD-10-CM | POA: Diagnosis present

## 2019-01-20 DIAGNOSIS — Z6834 Body mass index (BMI) 34.0-34.9, adult: Secondary | ICD-10-CM | POA: Diagnosis not present

## 2019-01-20 DIAGNOSIS — E1100 Type 2 diabetes mellitus with hyperosmolarity without nonketotic hyperglycemic-hyperosmolar coma (NKHHC): Secondary | ICD-10-CM | POA: Diagnosis not present

## 2019-01-20 DIAGNOSIS — J9601 Acute respiratory failure with hypoxia: Secondary | ICD-10-CM | POA: Diagnosis not present

## 2019-01-20 DIAGNOSIS — K219 Gastro-esophageal reflux disease without esophagitis: Secondary | ICD-10-CM | POA: Diagnosis present

## 2019-01-20 DIAGNOSIS — Z9119 Patient's noncompliance with other medical treatment and regimen: Secondary | ICD-10-CM

## 2019-01-20 DIAGNOSIS — I110 Hypertensive heart disease with heart failure: Secondary | ICD-10-CM | POA: Diagnosis present

## 2019-01-20 DIAGNOSIS — J9859 Other diseases of mediastinum, not elsewhere classified: Secondary | ICD-10-CM | POA: Diagnosis present

## 2019-01-20 DIAGNOSIS — F329 Major depressive disorder, single episode, unspecified: Secondary | ICD-10-CM | POA: Diagnosis present

## 2019-01-20 HISTORY — DX: Dyspnea, unspecified: R06.00

## 2019-01-20 LAB — POCT I-STAT EG7
Acid-Base Excess: 7 mmol/L — ABNORMAL HIGH (ref 0.0–2.0)
Bicarbonate: 35.5 mmol/L — ABNORMAL HIGH (ref 20.0–28.0)
Calcium, Ion: 1.16 mmol/L (ref 1.15–1.40)
HCT: 35 % — ABNORMAL LOW (ref 36.0–46.0)
Hemoglobin: 11.9 g/dL — ABNORMAL LOW (ref 12.0–15.0)
O2 Saturation: 95 %
Potassium: 3.4 mmol/L — ABNORMAL LOW (ref 3.5–5.1)
Sodium: 147 mmol/L — ABNORMAL HIGH (ref 135–145)
TCO2: 38 mmol/L — ABNORMAL HIGH (ref 22–32)
pCO2, Ven: 69.6 mmHg — ABNORMAL HIGH (ref 44.0–60.0)
pH, Ven: 7.315 (ref 7.250–7.430)
pO2, Ven: 85 mmHg — ABNORMAL HIGH (ref 32.0–45.0)

## 2019-01-20 LAB — COMPREHENSIVE METABOLIC PANEL
ALT: 50 U/L — ABNORMAL HIGH (ref 0–44)
AST: 56 U/L — ABNORMAL HIGH (ref 15–41)
Albumin: 3.1 g/dL — ABNORMAL LOW (ref 3.5–5.0)
Alkaline Phosphatase: 60 U/L (ref 38–126)
Anion gap: 11 (ref 5–15)
BUN: 12 mg/dL (ref 8–23)
CO2: 31 mmol/L (ref 22–32)
Calcium: 9 mg/dL (ref 8.9–10.3)
Chloride: 105 mmol/L (ref 98–111)
Creatinine, Ser: 0.78 mg/dL (ref 0.44–1.00)
GFR calc Af Amer: >60 mL/min (ref >60)
GFR calc non Af Amer: >60 mL/min (ref >60)
Glucose, Bld: 268 mg/dL — ABNORMAL HIGH (ref 70–99)
Potassium: 3.4 mmol/L — ABNORMAL LOW (ref 3.5–5.1)
Sodium: 147 mmol/L — ABNORMAL HIGH (ref 135–145)
Total Bilirubin: 0.3 mg/dL (ref 0.3–1.2)
Total Protein: 5.7 g/dL — ABNORMAL LOW (ref 6.5–8.1)

## 2019-01-20 LAB — CBC WITH DIFFERENTIAL/PLATELET
Abs Immature Granulocytes: 0.05 10*3/uL (ref 0.00–0.07)
Basophils Absolute: 0 10*3/uL (ref 0.0–0.1)
Basophils Relative: 0 %
Eosinophils Absolute: 0.1 10*3/uL (ref 0.0–0.5)
Eosinophils Relative: 1 %
HCT: 38.4 % (ref 36.0–46.0)
Hemoglobin: 9 g/dL — ABNORMAL LOW (ref 12.0–15.0)
Immature Granulocytes: 0 %
Lymphocytes Relative: 17 %
Lymphs Abs: 2 10*3/uL (ref 0.7–4.0)
MCH: 18.5 pg — ABNORMAL LOW (ref 26.0–34.0)
MCHC: 23.4 g/dL — ABNORMAL LOW (ref 30.0–36.0)
MCV: 78.9 fL — ABNORMAL LOW (ref 80.0–100.0)
Monocytes Absolute: 1.1 10*3/uL — ABNORMAL HIGH (ref 0.1–1.0)
Monocytes Relative: 9 %
Neutro Abs: 8.2 10*3/uL — ABNORMAL HIGH (ref 1.7–7.7)
Neutrophils Relative %: 73 %
Platelets: 260 10*3/uL (ref 150–400)
RBC: 4.87 MIL/uL (ref 3.87–5.11)
RDW: 20.4 % — ABNORMAL HIGH (ref 11.5–15.5)
WBC: 11.4 10*3/uL — ABNORMAL HIGH (ref 4.0–10.5)
nRBC: 1.1 % — ABNORMAL HIGH (ref 0.0–0.2)

## 2019-01-20 LAB — TROPONIN I (HIGH SENSITIVITY)
Troponin I (High Sensitivity): 16 ng/L (ref <18)
Troponin I (High Sensitivity): 16 ng/L (ref <18)

## 2019-01-20 LAB — BRAIN NATRIURETIC PEPTIDE: B Natriuretic Peptide: 106.9 pg/mL — ABNORMAL HIGH (ref 0.0–100.0)

## 2019-01-20 LAB — IRON AND TIBC
Iron: 15 ug/dL — ABNORMAL LOW (ref 28–170)
Saturation Ratios: 3 % — ABNORMAL LOW (ref 10.4–31.8)
TIBC: 442 ug/dL (ref 250–450)
UIBC: 427 ug/dL

## 2019-01-20 LAB — RETICULOCYTES
Immature Retic Fract: 37.5 % — ABNORMAL HIGH (ref 2.3–15.9)
RBC.: 4.91 MIL/uL (ref 3.87–5.11)
Retic Count, Absolute: 99.7 10*3/uL (ref 19.0–186.0)
Retic Ct Pct: 2 % (ref 0.4–3.1)

## 2019-01-20 LAB — GLUCOSE, CAPILLARY: Glucose-Capillary: 266 mg/dL — ABNORMAL HIGH (ref 70–99)

## 2019-01-20 LAB — POC SARS CORONAVIRUS 2 AG -  ED: SARS Coronavirus 2 Ag: NEGATIVE

## 2019-01-20 LAB — FERRITIN: Ferritin: 7 ng/mL — ABNORMAL LOW (ref 11–307)

## 2019-01-20 LAB — SARS CORONAVIRUS 2 (TAT 6-24 HRS): SARS Coronavirus 2: NEGATIVE

## 2019-01-20 LAB — CBG MONITORING, ED: Glucose-Capillary: 351 mg/dL — ABNORMAL HIGH (ref 70–99)

## 2019-01-20 LAB — VITAMIN B12: Vitamin B-12: 311 pg/mL (ref 180–914)

## 2019-01-20 MED ORDER — ALBUTEROL SULFATE HFA 108 (90 BASE) MCG/ACT IN AERS
2.0000 | INHALATION_SPRAY | Freq: Once | RESPIRATORY_TRACT | Status: AC
  Administered 2019-01-20: 11:00:00 2 via RESPIRATORY_TRACT
  Filled 2019-01-20: qty 6.7

## 2019-01-20 MED ORDER — PAROXETINE HCL 20 MG PO TABS
40.0000 mg | ORAL_TABLET | Freq: Every morning | ORAL | Status: DC
  Administered 2019-01-21 – 2019-02-02 (×12): 40 mg via ORAL
  Filled 2019-01-20 (×13): qty 2

## 2019-01-20 MED ORDER — FLUTICASONE FUROATE-VILANTEROL 100-25 MCG/INH IN AEPB
1.0000 | INHALATION_SPRAY | Freq: Every day | RESPIRATORY_TRACT | Status: DC
  Filled 2019-01-20: qty 28

## 2019-01-20 MED ORDER — FUROSEMIDE 40 MG PO TABS
40.0000 mg | ORAL_TABLET | Freq: Every day | ORAL | Status: DC
  Administered 2019-01-21 – 2019-02-02 (×12): 40 mg via ORAL
  Filled 2019-01-20 (×13): qty 1

## 2019-01-20 MED ORDER — METHYLPREDNISOLONE SODIUM SUCC 40 MG IJ SOLR
40.0000 mg | Freq: Four times a day (QID) | INTRAMUSCULAR | Status: DC
  Administered 2019-01-20 – 2019-01-21 (×4): 40 mg via INTRAVENOUS
  Filled 2019-01-20 (×4): qty 1

## 2019-01-20 MED ORDER — GUAIFENESIN ER 600 MG PO TB12
600.0000 mg | ORAL_TABLET | Freq: Two times a day (BID) | ORAL | Status: DC
  Administered 2019-01-21 – 2019-02-02 (×22): 600 mg via ORAL
  Filled 2019-01-20 (×24): qty 1

## 2019-01-20 MED ORDER — INSULIN GLARGINE 100 UNIT/ML SOLOSTAR PEN: 25.0000 [IU] | PEN_INJECTOR | Freq: Every day | SUBCUTANEOUS | Status: DC

## 2019-01-20 MED ORDER — UMECLIDINIUM BROMIDE 62.5 MCG/INH IN AEPB
1.0000 | INHALATION_SPRAY | Freq: Every day | RESPIRATORY_TRACT | Status: DC
  Administered 2019-01-24 – 2019-02-02 (×10): 1 via RESPIRATORY_TRACT
  Filled 2019-01-20 (×2): qty 7

## 2019-01-20 MED ORDER — FLUTICASONE PROPIONATE 50 MCG/ACT NA SUSP
1.0000 | Freq: Every day | NASAL | Status: DC
  Administered 2019-01-22 – 2019-01-31 (×8): 1 via NASAL
  Filled 2019-01-20: qty 16

## 2019-01-20 MED ORDER — INSULIN ASPART 100 UNIT/ML ~~LOC~~ SOLN
0.0000 [IU] | Freq: Three times a day (TID) | SUBCUTANEOUS | Status: DC
  Administered 2019-01-20: 18:00:00 15 [IU] via SUBCUTANEOUS
  Administered 2019-01-21: 17:00:00 8 [IU] via SUBCUTANEOUS
  Administered 2019-01-21 – 2019-01-23 (×6): 5 [IU] via SUBCUTANEOUS
  Administered 2019-01-23: 3 [IU] via SUBCUTANEOUS
  Administered 2019-01-23: 5 [IU] via SUBCUTANEOUS
  Administered 2019-01-24: 8 [IU] via SUBCUTANEOUS
  Administered 2019-01-24: 15 [IU] via SUBCUTANEOUS
  Administered 2019-01-24 – 2019-01-25 (×2): 5 [IU] via SUBCUTANEOUS
  Administered 2019-01-25: 11 [IU] via SUBCUTANEOUS
  Administered 2019-01-26: 5 [IU] via SUBCUTANEOUS
  Administered 2019-01-26: 3 [IU] via SUBCUTANEOUS
  Administered 2019-01-26: 5 [IU] via SUBCUTANEOUS
  Administered 2019-01-27 (×2): 15 [IU] via SUBCUTANEOUS
  Administered 2019-01-27: 8 [IU] via SUBCUTANEOUS
  Administered 2019-01-28: 11 [IU] via SUBCUTANEOUS
  Administered 2019-01-28: 5 [IU] via SUBCUTANEOUS

## 2019-01-20 MED ORDER — POTASSIUM CHLORIDE CRYS ER 20 MEQ PO TBCR
40.0000 meq | EXTENDED_RELEASE_TABLET | Freq: Once | ORAL | Status: AC
  Administered 2019-01-20: 18:00:00 20 meq via ORAL
  Filled 2019-01-20: qty 2

## 2019-01-20 MED ORDER — ACETAZOLAMIDE 250 MG PO TABS
250.0000 mg | ORAL_TABLET | Freq: Two times a day (BID) | ORAL | Status: DC
  Administered 2019-01-21 – 2019-02-02 (×23): 250 mg via ORAL
  Filled 2019-01-20 (×29): qty 1

## 2019-01-20 MED ORDER — METHYLPREDNISOLONE SODIUM SUCC 125 MG IJ SOLR
125.0000 mg | Freq: Once | INTRAMUSCULAR | Status: AC
  Administered 2019-01-20: 125 mg via INTRAVENOUS
  Filled 2019-01-20: qty 2

## 2019-01-20 MED ORDER — FLUTICASONE-UMECLIDIN-VILANT 100-62.5-25 MCG/INH IN AEPB: 1.0000 | INHALATION_SPRAY | Freq: Every day | RESPIRATORY_TRACT | Status: DC

## 2019-01-20 MED ORDER — ACETAMINOPHEN 325 MG PO TABS
650.0000 mg | ORAL_TABLET | Freq: Four times a day (QID) | ORAL | Status: DC | PRN
  Filled 2019-01-20: qty 2

## 2019-01-20 MED ORDER — POTASSIUM CHLORIDE 10 MEQ/100ML IV SOLN
10.0000 meq | INTRAVENOUS | Status: AC
  Administered 2019-01-20: 19:00:00 10 meq via INTRAVENOUS
  Filled 2019-01-20: qty 100

## 2019-01-20 MED ORDER — MAGNESIUM SULFATE 2 GM/50ML IV SOLN
2.0000 g | Freq: Once | INTRAVENOUS | Status: AC
  Administered 2019-01-20: 11:00:00 2 g via INTRAVENOUS
  Filled 2019-01-20: qty 50

## 2019-01-20 MED ORDER — DOXYCYCLINE HYCLATE 100 MG PO TABS
100.0000 mg | ORAL_TABLET | Freq: Two times a day (BID) | ORAL | Status: AC
  Administered 2019-01-20 – 2019-01-24 (×8): 100 mg via ORAL
  Filled 2019-01-20 (×9): qty 1

## 2019-01-20 MED ORDER — ENOXAPARIN SODIUM 40 MG/0.4ML ~~LOC~~ SOLN
40.0000 mg | SUBCUTANEOUS | Status: DC
  Administered 2019-01-20 – 2019-02-01 (×13): 40 mg via SUBCUTANEOUS
  Filled 2019-01-20 (×13): qty 0.4

## 2019-01-20 MED ORDER — IPRATROPIUM-ALBUTEROL 0.5-2.5 (3) MG/3ML IN SOLN
3.0000 mL | Freq: Four times a day (QID) | RESPIRATORY_TRACT | Status: DC
  Administered 2019-01-20 – 2019-01-24 (×17): 3 mL via RESPIRATORY_TRACT
  Filled 2019-01-20 (×14): qty 3

## 2019-01-20 MED ORDER — ALBUTEROL SULFATE HFA 108 (90 BASE) MCG/ACT IN AERS
2.0000 | INHALATION_SPRAY | Freq: Four times a day (QID) | RESPIRATORY_TRACT | Status: DC | PRN
  Filled 2019-01-20: qty 6.7

## 2019-01-20 MED ORDER — PANTOPRAZOLE SODIUM 40 MG PO TBEC
40.0000 mg | DELAYED_RELEASE_TABLET | Freq: Every day | ORAL | Status: DC
  Administered 2019-01-21 – 2019-02-02 (×12): 40 mg via ORAL
  Filled 2019-01-20 (×13): qty 1

## 2019-01-20 MED ORDER — POTASSIUM CHLORIDE 10 MEQ/100ML IV SOLN
10.0000 meq | Freq: Once | INTRAVENOUS | Status: AC
  Administered 2019-01-20: 23:00:00 10 meq via INTRAVENOUS
  Filled 2019-01-20: qty 100

## 2019-01-20 MED ORDER — ALPRAZOLAM 0.25 MG PO TABS
0.2500 mg | ORAL_TABLET | Freq: Three times a day (TID) | ORAL | Status: DC | PRN
  Administered 2019-01-21 – 2019-02-02 (×28): 0.25 mg via ORAL
  Filled 2019-01-20 (×30): qty 1

## 2019-01-20 NOTE — Progress Notes (Signed)
MD requested BiPAP for patient while COVID results are pending.  RT discussed with MD Pt's condition, current hospital policy regarding the use of NIV on Pts without negative test results.  MD was adamant about using NIV on this Pt.  Pt placed on NIV using Servo-I ventilator with closed circuit.  Every precaution taken to ensure Pt/staff safety and to limit the risk of viral transmission.  Full airborne isolation established in the absence of a negative pressure room.  RN aware, RT supervisor aware and concurs with this course of action.

## 2019-01-20 NOTE — ED Notes (Signed)
Lunch tray ordered for patient.

## 2019-01-20 NOTE — ED Provider Notes (Signed)
[name]   CSN: [id number] Arrival date & time: 01/20/19  1009     History Chief Complaint  Patient presents with  . Shortness of Breath    [name] is a 73 y.o. female hx of CHF, COPD on 6 L [place] at baseline, here presenting with shortness of breath.  Patient states that she is on 6 L nasal cannula at baseline and has worsening shortness of breath since yesterday.  Patient woke up and her oxygen was 80% on her baseline 6 L.  Patient denies any sick contacts.  Denies any fevers or chills uses albuterol and 20 mg of prednisone currently.   The history is provided by the patient.       Past Medical History:  Diagnosis Date  . Arrhythmia   . Bronchiectasis march 2012   On CT chest. RUL. Mild  . CHF (congestive heart failure) ([place])   . Chicken pox   . Chronic diastolic heart failure ([place])   . Chronic respiratory failure ([place])    Followed in Pulmonary clinic/ [facility]/ Ramaswamy  - 06/30/2012 desat to 86%  RA walking 50 ft, recovered to 90% at rest - 06/30/2012  Walked 1lpm x 3 laps @ 185 ft each stopped due to  Sob, no desat  rec 02 2lpm with activity and sleeping, ok at rest   . COPD (chronic obstructive pulmonary disease) ([place])     FEV-1 in 2008 was 63% with a diffusion capacity of 33%.   . Generalized anxiety disorder   . History of cervical cancer 1982  . Hyperlipidemia   . Hypertension   . Leg swelling    Venous doppler right 07/21/12 >>Neg    . 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 Medical history non-contributory   . Medical non-compliance   . MITRAL VALVE PROLAPSE   . Obesity, unspecified   . Pulmonary nodule 03/2010   49mm RUL and RLL 1st seen march 2012 CT, ?progression 12/2012 CT  . Seasonal allergies   . Tobacco abuse     Smokes one pack a day since age 5.    Patient Active Problem List   Diagnosis Date Noted  . On corticosteroid therapy 12/08/2018  .  Current chronic use of systemic steroids 12/08/2018  . Complex care coordination 11/13/2018  . Palliative care by specialist   . DNR (do not resuscitate) discussion   . COPD exacerbation ([place]) 11/02/2018  . Hypokalemia 11/02/2018  . Acute on chronic respiratory failure with hypoxia ([place]) 08/26/2018  . Hyperkalemia 08/26/2018  . Psoriasis 07/01/2017  . Depression 06/24/2016  . Pulmonary hypertension ([place])   . Physical deconditioning 11/15/2015  . GERD (gastroesophageal reflux disease) 11/01/2015  . 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 Diabetes ([place]) 01/23/2015  . Fatigue 01/20/2015  . Obesity, unspecified 03/15/2013  . Acute on chronic diastolic CHF (congestive heart failure) ([place]) 03/14/2013  . Chronic respiratory failure ([place]) 06/30/2012  . COPD (chronic obstructive pulmonary disease) ([place])   . Tobacco abuse   . Pulmonary nodule 03/22/2010  . 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 Bronchiectasis ([place]) 03/22/2010  . Hyperlipidemia 06/13/2008  . Anxiety 02/20/2007  . MITRAL VALVE PROLAPSE 02/20/2007  . CERVICAL CANCER, HX OF 02/20/2007    Past Surgical History:  Procedure Laterality Date  . TOTAL ABDOMINAL HYSTERECTOMY       OB History   No obstetric history on file.     Family History  Problem Relation Age of Onset  . Other Father        MVA  . Esophageal cancer Mother     Social History   Tobacco Use  . Smoking status: Current Every Day Smoker    Packs/day: 0.25    Years: 50.00    Pack years: 12.50    Types: Cigarettes  . Smokeless tobacco: Never Used  . Tobacco comment: 3-4 cigaretters per day 11/20/2017   Substance Use Topics  . Alcohol use: Yes    Alcohol/week: 0.0 standard drinks    Comment: occasional  . Drug use: No    Home Medications Prior to Admission medications   Medication Sig Start Date End Date Taking? Authorizing Provider  acetaZOLAMIDE (DIAMOX) 250 MG tablet Take 1 tablet (250 mg total) by mouth 2 (two) times daily. 09/05/18   Nita Sells, MD  albuterol (PROVENTIL HFA;VENTOLIN HFA) 108 (90 Base) MCG/ACT inhaler Inhale 2 puffs into the lungs every 6 (six) hours as needed for wheezing or shortness of breath. 12/24/17   Brand Males, MD  ALPRAZolam Duanne Moron) 1 MG tablet Take 1 tablet (1 mg total) by mouth 3 (three) times daily as needed for anxiety. 01/04/19   Burns, Claudina Lick, MD  diphenhydramine-acetaminophen (TYLENOL PM) 25-500 MG TABS tablet Take 2 tablets by mouth at bedtime as needed (pain/sleep).    [provider]  fluticasone (FLONASE) 50 MCG/ACT nasal spray SPRAY 2 SPRAYS INTO EACH NOSTRIL EVERY DAY 01/11/19   Brand Males, MD  Fluticasone-Umeclidin-Vilant (TRELEGY ELLIPTA) 100-62.5-25 MCG/INH AEPB Inhale 1 puff into the lungs daily. 07/16/18   Brand Males, MD  furosemide (LASIX) 40 MG tablet Take 40 mg by mouth daily.     [provider]  GLUCOCOM LANCETS 33G MISC Use with Test Strips to take blood sugars 07/20/15   Burns, Claudina Lick, MD  guaiFENesin (MUCINEX) 600 MG 12 hr tablet Take 1 tablet (600 mg total) by mouth 2 (two) times daily. 01/04/19   Martyn Ehrich, NP  Insulin Glargine (LANTUS SOLOSTAR) 100 UNIT/ML Solostar Pen Inject 25 Units into the skin at bedtime. DAILY AT 10 PM. 11/10/18   Burns, Claudina Lick, MD  Insulin Pen Needle (BD PEN NEEDLE NANO U/F) 32G X 4 MM MISC USE TO ADMINISTER LANTUS INSULIN 05/08/16   Binnie Rail, MD  pantoprazole (PROTONIX) 40 MG tablet Take 1 tablet (40 mg total) by mouth daily. 01/26/18   Binnie Rail, MD  PARoxetine (PAXIL) 40 MG tablet TAKE 1 TABLET BY MOUTH EVERY DAY IN THE MORNING. Patient taking differently: Take 40 mg by mouth every morning. TAKE 1 TABLET BY MOUTH EVERY DAY IN THE MORNING. 08/20/18   Binnie Rail, MD  predniSONE (DELTASONE) 20 MG tablet Take 1 tablet (20 mg total) by mouth daily with breakfast. One daily 12/08/18   Lauraine Rinne, NP    Allergies    Ambien [zolpidem tartrate]  Review of Systems   Review of Systems    Respiratory: Positive for shortness of breath.   All other systems reviewed and are negative.   Physical Exam Updated Vital Signs BP 137/82   Pulse (!) 101   Temp 98.4 F (36.9 C) (Axillary)   Resp 19   SpO2 96%   Physical Exam Vitals and nursing [name] reviewed.  HENT:     Head: Normocephalic.     Mouth/Throat:     Mouth: Mucous membranes are moist.  Eyes:     Pupils: Pupils are equal, round, and reactive to light.  Cardiovascular:     Rate and Rhythm: Normal rate and regular rhythm.  Pulmonary:     Comments: Tachypneic, diffuse wheezing, no retractions  Abdominal:     General: Bowel sounds are normal.     Palpations: Abdomen is soft.  Musculoskeletal:        General: Normal range of motion.     Cervical back: Normal range of motion and neck supple.  Skin:    General: Skin is warm.     Capillary Refill: Capillary refill takes less than 2 seconds.  Neurological:     General: No focal deficit present.     Mental Status: She is alert and oriented to person, place, and time.  Psychiatric:        Mood and Affect: Mood normal.        Behavior: Behavior normal.     ED Results / Procedures / Treatments   Labs (all labs ordered are listed, but only abnormal results are displayed) Labs Reviewed  CBC WITH DIFFERENTIAL/PLATELET - Abnormal; Notable for the following components:      Result Value   WBC 11.4 (*)    Hemoglobin 9.0 (*)    MCV 78.9 (*)    MCH 18.5 (*)    MCHC 23.4 (*)    RDW 20.4 (*)    nRBC 1.1 (*)    Neutro Abs 8.2 (*)    Monocytes Absolute 1.1 (*)    All other components within normal limits  COMPREHENSIVE METABOLIC PANEL - Abnormal; Notable for the following components:   Sodium 147 (*)    Potassium 3.4 (*)    Glucose, Bld 268 (*)    Total Protein 5.7 (*)    Albumin 3.1 (*)    AST 56 (*)    ALT 50 (*)    All other components within normal limits  POCT I-STAT EG7 - Abnormal; Notable for the following components:   pCO2, Ven 69.6 (*)    pO2, Ven  85.0 (*)    Bicarbonate 35.5 (*)    TCO2 38 (*)    Acid-Base Excess 7.0 (*)    Sodium 147 (*)    Potassium 3.4 (*)    HCT 35.0 (*)    Hemoglobin 11.9 (*)    All other components within normal limits  SARS CORONAVIRUS 2 (TAT 6-24 HRS)  BLOOD GAS, VENOUS  BRAIN NATRIURETIC PEPTIDE  POC SARS CORONAVIRUS 2 AG -  ED  TROPONIN I (HIGH SENSITIVITY)  TROPONIN I (HIGH SENSITIVITY)    EKG EKG Interpretation  Date/Time:  Wednesday January 20 2019 11:13:42 EST Ventricular Rate:  97 PR Interval:    QRS Duration: 88 QT Interval:  388 QTC Calculation: 488 R Axis:   88 Text Interpretation: Sinus rhythm Atrial premature complex Anteroseptal infarct, age indeterminate No significant change since last tracing Confirmed by Wandra Arthurs [id number]) on 01/20/2019 11:27:16 AM   Radiology DG Chest Port 1 View  Result Date: 01/20/2019 CLINICAL DATA:  Shortness of breath. Additional history provided: Shortness of breath for 2 days, worsening this morning. EXAM: PORTABLE CHEST 1 VIEW COMPARISON:  Chest radiograph 01/02/2019 FINDINGS: Cardiomegaly. Redemonstrated chronic interstitial prominence. Ill-defined bibasilar opacities are slightly more conspicuous than on prior examination 11/02/2018. Persistent small bilateral pleural effusions. As before, the left pleural effusion may be partially loculated. No evidence of pneumothorax. No acute bony abnormality. Overlying cardiac monitoring leads. IMPRESSION: Cardiomegaly. Bibasilar ill-defined opacities are more conspicuous than on prior chest radiograph 11/02/2018. Findings may reflect edema, atelectasis and/or pneumonia. Persistent small  bilateral pleural effusions. As before, the left pleural effusion may be partially loculated. Electronically Signed   By: Kellie Simmering DO   On: 01/20/2019 10:38    Procedures Procedures (including critical care time)  CRITICAL CARE Performed by: Wandra Arthurs   Total critical care time: 30 minutes  Critical care time was  exclusive of separately billable procedures and treating other patients.  Critical care was necessary to treat or prevent imminent or life-threatening deterioration.  Critical care was time spent personally by me on the following activities: development of treatment plan with patient and/or surrogate as well as nursing, discussions with consultants, evaluation of patient's response to treatment, examination of patient, obtaining history from patient or surrogate, ordering and performing treatments and interventions, ordering and review of laboratory studies, ordering and review of radiographic studies, pulse oximetry and re-evaluation of patient's condition.   Medications Ordered in ED Medications  methylPREDNISolone sodium succinate (SOLU-MEDROL) 125 mg/2 mL injection 125 mg (125 mg Intravenous Given 01/20/19 1039)  magnesium sulfate IVPB 2 g 50 mL (2 g Intravenous New Bag/Given 01/20/19 1121)  albuterol (VENTOLIN HFA) 108 (90 Base) MCG/ACT inhaler 2 puff (2 puffs Inhalation Given 01/20/19 1039)    ED Course  I have reviewed the triage vital signs and the nursing notes.  Pertinent labs & imaging results that were available during my care of the patient were reviewed by me and considered in my medical decision making (see chart for details).    MDM Rules/Calculators/A&P                     [name] is a 73 y.o. female here with SOB. Likely COPD exacerbation.  Denies any Covid exposure. She was put on nonrebreather by EMS .  Will get VBG and labs and chest x-ray and test for Covid.   1:04 PM CXR showed pleural effusions. COVID negative. Placed on 10 L high flow nasal cannula and doing well. Given solumedrol, magnesium, albuterol. Will admit to stepdown for COPD exacerbation   [name] was evaluated in Emergency Department on 01/20/2019 for the symptoms described in the history of present illness. She was evaluated in the context of the global COVID-19 pandemic, which necessitated  consideration that the patient might be at risk for infection with the SARS-CoV-2 virus that causes COVID-19. Institutional protocols and algorithms that pertain to the evaluation of patients at risk for COVID-19 are in a state of rapid change based on information released by regulatory bodies including the CDC and federal and state organizations. These policies and algorithms were followed during the patient's care in the ED.   Final Clinical Impression(s) / ED Diagnoses Final diagnoses:  None    Rx / DC Orders ED Discharge Orders    None       Drenda Freeze, MD 01/20/19 [phone]

## 2019-01-20 NOTE — ED Notes (Signed)
MD made aware by this RN that patient is unable to swallow pills at this time due to Respiratory Status.

## 2019-01-20 NOTE — ED Triage Notes (Signed)
Pt BIB [facility] for shortness of breath that started last night and has worsened. Pt 80% on her 6L [place] that she wears at baseline upon EMS arrival. Pt arrived 92% on NRB at 15L upon ED arrival. PMH of COPD. Pt reported she did use nebulizer and inhaler at home. No known COVID contact that patient is aware of. Pt alert and oriented to person, place and situation upon arrival. Confused to month.

## 2019-01-20 NOTE — ED Notes (Signed)
Pt unable to tolerate PO meds at this time due to respiratory status. MD paged by this RN.

## 2019-01-20 NOTE — H&P (Addendum)
History and Physical    [name] [id number] DOB: [birth year] DOA: 01/20/2019  PCP: Binnie Rail, MD   Patient coming from: Home  I have personally briefly reviewed patient's old medical records in [place]  Chief Complaint: Short of breath.  HPI: [name] is a 73 y.o. female with medical history significant of COPD baseline 6 L of oxygen at all times, bronchiectasis, diastolic CHF, HLD, HTN, mediastinal mass, CT, pulmonary nodules, Tobacco abuse psoriasis, GERD, diabetes, presented with increasing short of breath since yesterday, with worsening of wheezing and occasional dry cough no fevers no chills no chest pain no leg edema she denied any Covid contact.  Denied any muscle ache night sweat, dysuria diarrhea.    ED Course: ABG showed mixed hypoxia and hypercapnia, received Solu-Medrol and bronchodilator.  Review of Systems: As per HPI otherwise 10 point review of systems negative.   Past Medical History:  Diagnosis Date  . Arrhythmia   . Bronchiectasis march 2012   On CT chest. RUL. Mild  . CHF (congestive heart failure) ([place])   . Chicken pox   . Chronic diastolic heart failure ([place])   . Chronic respiratory failure ([place])    Followed in Pulmonary clinic/ [facility]/ Ramaswamy  - 06/30/2012 desat to 86%  RA walking 50 ft, recovered to 90% at rest - 06/30/2012  Walked 1lpm x 3 laps @ 185 ft each stopped due to  Sob, no desat  rec 02 2lpm with activity and sleeping, ok at rest   . COPD (chronic obstructive pulmonary disease) ([place])     FEV-1 in 2008 was 63% with a diffusion capacity of 33%.   . Generalized anxiety disorder   . History of cervical cancer 1982  . Hyperlipidemia   . Hypertension   . Leg swelling    Venous doppler right 07/21/12 >>Neg    . 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 Medical history non-contributory   . Medical non-compliance   . MITRAL VALVE PROLAPSE   . Obesity, unspecified   . Pulmonary  nodule 03/2010   68mm RUL and RLL 1st seen march 2012 CT, ?progression 12/2012 CT  . Seasonal allergies   . Tobacco abuse     Smokes one pack a day since age 47.    Past Surgical History:  Procedure Laterality Date  . TOTAL ABDOMINAL HYSTERECTOMY       reports that she has been smoking cigarettes. She has a 12.50 pack-year smoking history. She has never used smokeless tobacco. She reports current alcohol use. She reports that she does not use drugs.  Allergies  Allergen Reactions  . Ambien [Zolpidem Tartrate] Other (See Comments)    Causes nightmares    Family History  Problem Relation Age of Onset  . Other Father        MVA  . Esophageal cancer Mother      Prior to Admission medications   Medication Sig Start Date End Date Taking? Authorizing Provider  acetaZOLAMIDE (DIAMOX) 250 MG tablet Take 1 tablet (250 mg total) by mouth 2 (two) times daily. 09/05/18   Nita Sells, MD  albuterol (PROVENTIL HFA;VENTOLIN HFA) 108 (90 Base) MCG/ACT inhaler Inhale 2 puffs into the lungs every 6 (six) hours as needed for wheezing or shortness of breath. 12/24/17   Brand Males, MD  ALPRAZolam Duanne Moron) 1 MG tablet Take 1 tablet (1 mg total) by mouth 3 (three) times daily as needed for anxiety. 01/04/19  Binnie Rail, MD  diphenhydramine-acetaminophen (TYLENOL PM) 25-500 MG TABS tablet Take 2 tablets by mouth at bedtime as needed (pain/sleep).    [provider]  fluticasone (FLONASE) 50 MCG/ACT nasal spray SPRAY 2 SPRAYS INTO EACH NOSTRIL EVERY DAY 01/11/19   Brand Males, MD  Fluticasone-Umeclidin-Vilant (TRELEGY ELLIPTA) 100-62.5-25 MCG/INH AEPB Inhale 1 puff into the lungs daily. 07/16/18   Brand Males, MD  furosemide (LASIX) 40 MG tablet Take 40 mg by mouth daily.     [provider]  GLUCOCOM LANCETS 33G MISC Use with Test Strips to take blood sugars 07/20/15   Burns, Claudina Lick, MD  guaiFENesin (MUCINEX) 600 MG 12 hr tablet Take 1 tablet (600 mg total)  by mouth 2 (two) times daily. 01/04/19   Martyn Ehrich, NP  Insulin Glargine (LANTUS SOLOSTAR) 100 UNIT/ML Solostar Pen Inject 25 Units into the skin at bedtime. DAILY AT 10 PM. 11/10/18   Burns, Claudina Lick, MD  Insulin Pen Needle (BD PEN NEEDLE NANO U/F) 32G X 4 MM MISC USE TO ADMINISTER LANTUS INSULIN 05/08/16   Binnie Rail, MD  pantoprazole (PROTONIX) 40 MG tablet Take 1 tablet (40 mg total) by mouth daily. 01/26/18   Binnie Rail, MD  PARoxetine (PAXIL) 40 MG tablet TAKE 1 TABLET BY MOUTH EVERY DAY IN THE MORNING. Patient taking differently: Take 40 mg by mouth every morning. TAKE 1 TABLET BY MOUTH EVERY DAY IN THE MORNING. 08/20/18   Binnie Rail, MD  predniSONE (DELTASONE) 20 MG tablet Take 1 tablet (20 mg total) by mouth daily with breakfast. One daily 12/08/18   Lauraine Rinne, NP    Physical Exam: Vitals:   01/20/19 1027 01/20/19 1130 01/20/19 1134 01/20/19 1245  BP: 118/72 137/82  129/78  Pulse: 100 (!) 101  98  Resp: (!) 28 19  18   Temp: 98.4 F (36.9 C)     TempSrc: Axillary     SpO2: 92% 95% 96% 96%    Constitutional: NAD, calm, comfortable Vitals:   01/20/19 1027 01/20/19 1130 01/20/19 1134 01/20/19 1245  BP: 118/72 137/82  129/78  Pulse: 100 (!) 101  98  Resp: (!) 28 19  18   Temp: 98.4 F (36.9 C)     TempSrc: Axillary     SpO2: 92% 95% 96% 96%   Eyes: PERRL, lids and conjunctivae normal ENMT: Mucous membranes dry. Posterior pharynx clear of any exudate or lesions.Normal dentition.  Neck: normal, supple, no masses, no thyromegaly Respiratory: using accessory muscle to breathe, talking in broken sentences, significantly diminished breathing sounds bilaterally with scattered wheezing.  Cardiovascular: Regular rate and rhythm, no murmurs / rubs / gallops. 1+ extremity edema. 2+ pedal pulses. No carotid bruits.  Abdomen: no tenderness, no masses palpated. No hepatosplenomegaly. Bowel sounds positive.  Musculoskeletal: no clubbing / cyanosis. No joint deformity  upper and lower extremities. Good ROM, no contractures. Normal muscle tone.  Skin: no rashes, lesions, ulcers. No induration Neurologic: Lethargic, CN 2-12 grossly intact. Sensation intact, DTR normal. Strength 5/5 in all 4.  Psychiatric: Normal judgment and insight. Alert and oriented x 3. Normal mood.     Labs on Admission: I have personally reviewed following labs and imaging studies  CBC: Recent Labs  Lab 01/20/19 1024 01/20/19 1115  WBC 11.4*  --   NEUTROABS 8.2*  --   HGB 9.0* 11.9*  HCT 38.4 35.0*  MCV 78.9*  --   PLT 260  --    Basic Metabolic Panel: Recent Labs  Lab 01/20/19 1024 01/20/19 1115  NA 147* 147*  K 3.4* 3.4*  CL 105  --   CO2 31  --   GLUCOSE 268*  --   BUN 12  --   CREATININE 0.78  --   CALCIUM 9.0  --    GFR: CrCl cannot be calculated (Unknown ideal weight.). Liver Function Tests: Recent Labs  Lab 01/20/19 1024  AST 56*  ALT 50*  ALKPHOS 60  BILITOT 0.3  PROT 5.7*  ALBUMIN 3.1*   No results for input(s): LIPASE, AMYLASE in the last 168 hours. No results for input(s): AMMONIA in the last 168 hours. Coagulation Profile: No results for input(s): INR, PROTIME in the last 168 hours. Cardiac Enzymes: No results for input(s): CKTOTAL, CKMB, CKMBINDEX, TROPONINI in the last 168 hours. BNP (last 3 results) No results for input(s): PROBNP in the last 8760 hours. HbA1C: No results for input(s): HGBA1C in the last 72 hours. CBG: No results for input(s): GLUCAP in the last 168 hours. Lipid Profile: No results for input(s): CHOL, HDL, LDLCALC, TRIG, CHOLHDL, LDLDIRECT in the last 72 hours. Thyroid Function Tests: No results for input(s): TSH, T4TOTAL, FREET4, T3FREE, THYROIDAB in the last 72 hours. Anemia Panel: No results for input(s): VITAMINB12, FOLATE, FERRITIN, TIBC, IRON, RETICCTPCT in the last 72 hours. Urine analysis:    Component Value Date/Time   COLORURINE YELLOW 07/04/2015 1848   APPEARANCEUR CLOUDY (A) 07/04/2015 1848    LABSPEC 1.040 (H) 07/04/2015 1848   PHURINE 6.0 07/04/2015 1848   GLUCOSEU >1000 (A) 07/04/2015 1848   GLUCOSEU NEGATIVE 04/09/2006 1315   HGBUR NEGATIVE 07/04/2015 1848   BILIRUBINUR NEGATIVE 07/04/2015 1848   KETONESUR NEGATIVE 07/04/2015 1848   PROTEINUR NEGATIVE 07/04/2015 1848   UROBILINOGEN 1.0 04/04/2010 2323   NITRITE NEGATIVE 07/04/2015 1848   LEUKOCYTESUR NEGATIVE 07/04/2015 1848    Radiological Exams on Admission: DG Chest Port 1 View  Result Date: 01/20/2019 CLINICAL DATA:  Shortness of breath. Additional history provided: Shortness of breath for 2 days, worsening this morning. EXAM: PORTABLE CHEST 1 VIEW COMPARISON:  Chest radiograph 01/02/2019 FINDINGS: Cardiomegaly. Redemonstrated chronic interstitial prominence. Ill-defined bibasilar opacities are slightly more conspicuous than on prior examination 11/02/2018. Persistent small bilateral pleural effusions. As before, the left pleural effusion may be partially loculated. No evidence of pneumothorax. No acute bony abnormality. Overlying cardiac monitoring leads. IMPRESSION: Cardiomegaly. Bibasilar ill-defined opacities are more conspicuous than on prior chest radiograph 11/02/2018. Findings may reflect edema, atelectasis and/or pneumonia. Persistent small bilateral pleural effusions. As before, the left pleural effusion may be partially loculated. Electronically Signed   By: Kellie Simmering DO   On: 01/20/2019 10:38    EKG: Independently reviewed.  Chronic RBBB  Assessment/Plan Active Problems:   COPD (chronic obstructive pulmonary disease) (HCC)   Chronic respiratory failure (HCC)   COPD exacerbation (HCC)  Acute hypoxic and hypercapnic respiratory failure, due to COPD exacerbation.  Titrate oxygen sat O2 saturation 91 to 93%.  Discussed with ED physician regarding using BiPAP,  confirmation Covid test sent, if negative patient will be placed on BiPAP.  May need Trilogy to go home, hypercapnia seems like a chronic problem, as  her Ph is compensated.  P.o. doxycycline IV Solu-Medrol, other breathing meds.  Continue p.o. Diamox.  Decrease her Xanax from 1 mg to 0.25 mg 3 times daily as needed, discontinue Benadryl at bedtime.  Poorly controlled diabetes, continue her home dosage of Lantus, add sliding scale.  Anemia, chronic, microcytic, potentially contribute to her are mainly due  to still, check iron studies and reticulocyte count, denies any abdominal pain, check FOBT.  Chronic diastolic CHF, symptoms are mainly due to COPD exacerbation and her x-ray looks no significant changes compared to that in October this year, continue p.o. Lasix.  Pulmonary nodules, bilateral lower and mediastinal lymph adenosis, unknown etiology, according to home/critical care note in October, and had had PET scan in 2017 showed malignant features, and patient could not tolerate bronchoscopy.  Hypernatremia, given her overall breathing status will hold off IV fluid although this might due to dehydration, check BMP in the morning.  Hypokalemia mild, replace and recheck.  Deconditioning, patient under palliative care on last admission, her COPD looks very advanced, Gold stage IV, FEV1/FVC 47% in August 2020.  We will need close pulmonary follow-up.     DVT prophylaxis: Lovenox Code Status: DNI, patient allows chest compression. Family Communication: Son Linton Rump over the phone. disposition Plan: Progressive care until oxygen hypercapnia improved Consults called: None Admission status: PCU   Lequita Halt MD [facility] Pager [phone]  If 7PM-7AM, please contact night-coverage [email or web address] Password [email or web address]  01/20/2019, 1:27 PM

## 2019-01-20 NOTE — Progress Notes (Signed)
NIV therapy machine switched from a Servo-I to a [id number] w/o complications.

## 2019-01-20 NOTE — Progress Notes (Signed)
Patient transferred to 4E6 from the ER. No complications noted. Report given to unit RRT.

## 2019-01-20 NOTE — ED Notes (Signed)
This RN spoke with MD and requested he provide an update to patient's family.

## 2019-01-20 NOTE — ED Notes (Signed)
Dinner Tray Ordered @ 1712. 

## 2019-01-20 NOTE — ED Notes (Signed)
MD made aware of patients condition. Reported that patient is tachypneic and desat's to an SPO2 of 70% when the NRB is taken off. MD at bedside.

## 2019-01-21 ENCOUNTER — Other Ambulatory Visit: Payer: Self-pay | Admitting: *Deleted

## 2019-01-21 ENCOUNTER — Inpatient Hospital Stay (HOSPITAL_COMMUNITY): Payer: Medicare Other

## 2019-01-21 DIAGNOSIS — J9622 Acute and chronic respiratory failure with hypercapnia: Principal | ICD-10-CM

## 2019-01-21 DIAGNOSIS — J9621 Acute and chronic respiratory failure with hypoxia: Secondary | ICD-10-CM

## 2019-01-21 LAB — CBC
HCT: 34.8 % — ABNORMAL LOW (ref 36.0–46.0)
Hemoglobin: 8.6 g/dL — ABNORMAL LOW (ref 12.0–15.0)
MCH: 18.9 pg — ABNORMAL LOW (ref 26.0–34.0)
MCHC: 24.7 g/dL — ABNORMAL LOW (ref 30.0–36.0)
MCV: 76.5 fL — ABNORMAL LOW (ref 80.0–100.0)
Platelets: 243 10*3/uL (ref 150–400)
RBC: 4.55 MIL/uL (ref 3.87–5.11)
RDW: 20.2 % — ABNORMAL HIGH (ref 11.5–15.5)
WBC: 10 10*3/uL (ref 4.0–10.5)
nRBC: 1.1 % — ABNORMAL HIGH (ref 0.0–0.2)

## 2019-01-21 LAB — GLUCOSE, CAPILLARY
Glucose-Capillary: 249 mg/dL — ABNORMAL HIGH (ref 70–99)
Glucose-Capillary: 235 mg/dL — ABNORMAL HIGH (ref 70–99)
Glucose-Capillary: 259 mg/dL — ABNORMAL HIGH (ref 70–99)
Glucose-Capillary: 223 mg/dL — ABNORMAL HIGH (ref 70–99)

## 2019-01-21 LAB — BASIC METABOLIC PANEL
Anion gap: 8 (ref 5–15)
BUN: 13 mg/dL (ref 8–23)
CO2: 30 mmol/L (ref 22–32)
Calcium: 8.8 mg/dL — ABNORMAL LOW (ref 8.9–10.3)
Chloride: 107 mmol/L (ref 98–111)
Creatinine, Ser: 0.95 mg/dL (ref 0.44–1.00)
GFR calc Af Amer: >60 mL/min (ref >60)
GFR calc non Af Amer: >60 mL/min (ref >60)
Glucose, Bld: 264 mg/dL — ABNORMAL HIGH (ref 70–99)
Potassium: 4.8 mmol/L (ref 3.5–5.1)
Sodium: 145 mmol/L (ref 135–145)

## 2019-01-21 LAB — STREP PNEUMONIAE URINARY ANTIGEN: Strep Pneumo Urinary Antigen: NEGATIVE

## 2019-01-21 MED ORDER — FUROSEMIDE 10 MG/ML IJ SOLN
40.0000 mg | Freq: Once | INTRAMUSCULAR | Status: AC
  Administered 2019-01-21: 16:00:00 40 mg via INTRAVENOUS
  Filled 2019-01-21: qty 4

## 2019-01-21 MED ORDER — METHYLPREDNISOLONE SODIUM SUCC 125 MG IJ SOLR
60.0000 mg | Freq: Three times a day (TID) | INTRAMUSCULAR | Status: DC
  Administered 2019-01-21 – 2019-01-23 (×5): 60 mg via INTRAVENOUS
  Filled 2019-01-21 (×5): qty 2

## 2019-01-21 MED ORDER — INSULIN GLARGINE 100 UNIT/ML ~~LOC~~ SOLN
20.0000 [IU] | Freq: Every day | SUBCUTANEOUS | Status: DC
  Administered 2019-01-21 – 2019-01-26 (×6): 20 [IU] via SUBCUTANEOUS
  Filled 2019-01-21 (×7): qty 0.2

## 2019-01-21 MED ORDER — ORAL CARE MOUTH RINSE: 15.0000 mL | OROMUCOSAL | Status: DC

## 2019-01-21 MED ORDER — ARFORMOTEROL TARTRATE 15 MCG/2ML IN NEBU
15.0000 ug | INHALATION_SOLUTION | Freq: Two times a day (BID) | RESPIRATORY_TRACT | Status: DC
  Administered 2019-01-21 – 2019-02-02 (×23): 15 ug via RESPIRATORY_TRACT
  Filled 2019-01-21 (×23): qty 2

## 2019-01-21 MED ORDER — SODIUM CHLORIDE 0.9 % IV SOLN
INTRAVENOUS | Status: DC | PRN
  Administered 2019-01-21: 22:00:00 500 mL via INTRAVENOUS

## 2019-01-21 MED ORDER — BUDESONIDE 0.25 MG/2ML IN SUSP
0.2500 mg | Freq: Two times a day (BID) | RESPIRATORY_TRACT | Status: DC
  Administered 2019-01-21 – 2019-02-02 (×23): 0.25 mg via RESPIRATORY_TRACT
  Filled 2019-01-21 (×23): qty 2

## 2019-01-21 MED ORDER — CHLORHEXIDINE GLUCONATE 0.12% ORAL RINSE (MEDLINE KIT)
15.0000 mL | Freq: Two times a day (BID) | OROMUCOSAL | Status: DC
  Administered 2019-01-21 – 2019-01-31 (×11): 15 mL via OROMUCOSAL

## 2019-01-21 MED ORDER — POLYSACCHARIDE IRON COMPLEX 150 MG PO CAPS
150.0000 mg | ORAL_CAPSULE | Freq: Every day | ORAL | Status: DC
  Administered 2019-01-21 – 2019-02-02 (×11): 150 mg via ORAL
  Filled 2019-01-21 (×13): qty 1

## 2019-01-21 NOTE — Consult Note (Signed)
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  Well-known to Dr. [name] in the outpatient clinic but last seen by him in October 2019.  At that point in time in October 2019 she was on home palliative care with chronic prednisone 20 mg/day and Trelegy inhaler and 5 L of oxygen with continued ongoing smoking.  She was dependent on her nephew for her groceries.  She had a history of recurrent exacerbations.  Her baseline CAT score was 27 and in 2018 she did not have hypercapnia and FEV1 was around 53%.  She had televisit on mid December 2020 with increased shortness of breath and complains that her pulse ox was dropping into the 70s even with minimal exertion and she was completely homebound because of this.  Xanax was helping.  [Normally she saturates in the 80s].  Increase in oxygen to nearly 7 L was recommended and a prednisone taper.  Along with doxycycline.  She had a follow-up televisit on January 13, 2019.  It was felt that she was improving.  She then got admitted on January 20, 2019 with worsening shortness of breath with ABG showing hypoxemia and hypercapnia and started on BiPAP, steroids systemic and bronchodilator.  She is been unable to come off BiPAP.  She insist on coming off BiPAP but she desaturates even though she is feeling okay.  She has class IV dyspnea.  Nurse is extremely distressed at the level of dyspnea coming off the BiPAP.  She is asking to eat.  Given the complete dependency on BiPAP and class IV  dyspnea with severe hypoxemia pulmonary has been consulted  She is a limited code and is declined intubation.  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   reports that she has been smoking cigarettes. She has a 12.50 pack-year smoking history. She has never used smokeless tobacco.  Past Surgical History:  Procedure Laterality Date  . TOTAL ABDOMINAL HYSTERECTOMY      Allergies  Allergen Reactions  . Ambien [Zolpidem Tartrate] Other (See Comments)    Causes nightmares    Immunization History  Administered Date(s) Administered  . Fluad Quad(high Dose 65+) 11/05/2018  . Influenza Split 11/12/2010  . Influenza, High Dose Seasonal PF 11/09/2012, 10/31/2015, 11/20/2017  . Influenza,inj,Quad PF,6+ Mos 10/20/2014  . Pneumococcal Conjugate-13 05/17/2013  . Pneumococcal Polysaccharide-23 03/22/2010, 06/24/2016    Family History  Problem Relation Age of Onset  . Other Father        MVA  . Esophageal cancer Mother      Current Facility-Administered  Medications:  .  0.9 %  sodium chloride infusion, , Intravenous, PRN, [name] T, MD .  acetaminophen (TYLENOL) tablet 650 mg, 650 mg, Oral, Q6H PRN, [name] T, MD .  acetaZOLAMIDE (DIAMOX) tablet 250 mg, 250 mg, Oral, BID, [name] T, MD, 250 mg at 01/21/19 1242 .  albuterol (VENTOLIN HFA)  108 (90 Base) MCG/ACT inhaler 2 puff, 2 puff, Inhalation, Q6H PRN, [name] T, MD .  ALPRAZolam Duanne Moron) tablet 0.25 mg, 0.25 mg, Oral, TID PRN, [name] T, MD, 0.25 mg at 01/21/19 1239 .  arformoterol (BROVANA) nebulizer solution 15 mcg, 15 mcg, Nebulization, BID, Sheikh, Omair Latif, DO .  budesonide (PULMICORT) nebulizer solution 0.25 mg, 0.25 mg, Nebulization, BID, Sheikh, Omair Latif, DO .  chlorhexidine gluconate (MEDLINE KIT) (PERIDEX) 0.12 % solution 15 mL, 15 mL, Mouth Rinse, BID, Zhang, Ping T, MD .  doxycycline (VIBRA-TABS) tablet 100 mg, 100 mg, Oral, Q12H, [name] T, MD, 100 mg at 01/21/19 1240 .  enoxaparin (LOVENOX) injection 40 mg, 40 mg, Subcutaneous, Q24H, [name] T, MD, 40 mg at 01/21/19 1303 .  fluticasone (FLONASE) 50 MCG/ACT nasal spray 1 spray, 1 spray, Each Nare, Daily, Zhang, Pearletha Forge T, MD .  furosemide (LASIX) tablet 40 mg, 40 mg, Oral, Daily, [name] T, MD, 40 mg at 01/21/19 1241 .  guaiFENesin (MUCINEX) 12 hr tablet 600 mg, 600 mg, Oral, BID, Zhang, Ping T, MD .  insulin aspart (novoLOG) injection 0-15 Units, 0-15 Units, Subcutaneous, TID WC, Lequita Halt, MD, 8 Units at 01/21/19 1658 .  insulin glargine (LANTUS) injection 20 Units, 20 Units, Subcutaneous, QHS, Sheikh, Omair Latif, DO .  ipratropium-albuterol (DUONEB) 0.5-2.5 (3) MG/3ML nebulizer solution 3 mL, 3 mL, Nebulization, Q6H, [name] T, MD, 3 mL at 01/21/19 1440 .  iron polysaccharides (NIFEREX) capsule 150 mg, 150 mg, Oral, Daily, Sheikh, Omair Latif, DO .  methylPREDNISolone sodium succinate (SOLU-MEDROL) 125 mg/2 mL injection 60 mg, 60 mg, Intravenous, Q8H, Sheikh, Omair Latif, DO .  pantoprazole (PROTONIX) EC tablet 40 mg, 40 mg, Oral, Daily, [name] T, MD, 40 mg at 01/21/19 1241 .  PARoxetine (PAXIL) tablet 40 mg, 40 mg, Oral, q morning - 10a, [name] T, MD, 40 mg at 01/21/19 1238 .  umeclidinium bromide (INCRUSE ELLIPTA) 62.5 MCG/INH 1 puff, 1 puff, Inhalation, Daily, Raiford Noble [place], [place]   Significant Hospital Events   01/20/2019 - admit  Consults:  01/21/2019 - pulm  Procedures:  bipap  Significant Diagnostic Tests:  x  Micro Data:  x  Antimicrobials:  x   Interim history/subjective:  x  Objective   Blood pressure 128/79, pulse 85, temperature 98.9 F (37.2 C), temperature source Axillary, resp. rate 18, SpO2 95 %.    FiO2 (%):  [40 %-50 %] 40 %   Intake/Output Summary (Last 24 hours) at 01/21/2019 1847 Last data filed at 01/21/2019 1623 Gross per 24 hour  Intake 462.59 ml  Output 775 ml  Net -312.41 ml   There were no vitals filed for this visit.  Examination: General: Morbidly obese lady is lying in the bed with BiPAP on HENT: Supple neck unclear if elevated JVP.  No neck nodes Lungs: Overall distant air sounds.  No clear-cut wheezing.  More respiratory distress when she woke up with class IV dyspnea Cardiovascular: Regular rate and rhythm no murmurs Abdomen: Obese soft nontender Extremities: Baseline chronic edema no clubbing no cyanosis Neuro: Alert and oriented x3 GU: Not examined  Resolved Hospital Problem  list   X  Assessment & Plan:  Acute on chronic hypoxemic and hypercapnic respiratory failure -at baseline both stage IV COPD with chronic prednisone 5 L hypoxemia.  Seems to have new onset of left-sided density possibly pleural effusion   Plan -Continue BiPAP -Okay to stop BiPAP for eating if she wants especially because she is DO NOT INTUBATE -Accept brief episodes of hypoxemia to 75% as long as she can tolerate it subjectively -Get CT scan of the chest without contrast to see if there is any reversible etiology -Check respiratory virus panel, urine strep, urine Legionella for etiology -Systemic steroids - Aggressive bronchodilatation -If she declines then transition to full comfort care after discussion with her and her family [name] has been prepared for this]   Best practice:  Diet: N.p.o. but for  palliative reasons okay to eat especially because she is DNR and if she insists on eating Pain/Anxiety/Delirium protocol (if indicated): X VAP protocol (if indicated): Head of bed greater than 30 degrees DVT prophylaxis: According to the hospitalist GI prophylaxis: According to the hospitalist Glucose control: Needs sliding scale insulin but managed by the hospitalist Mobility: Bedrest Code Status: Partial code  Family Communication: =-Briefly explained to the patient but overall communication by the hospitalist Disposition: Remain in [place]   The patient [name] is critically ill with multiple organ systems failure and requires high complexity decision making for assessment and support, frequent evaluation and titration of therapies, application of advanced monitoring technologies and extensive interpretation of multiple databases.   Critical Care Time devoted to patient care services described in this note is  45  Minutes. This time reflects time of care of this signee Dr Brand Males. This critical care time does not reflect procedure time, or teaching time or supervisory time of PA/NP/Med student/Med Resident etc but could involve care discussion time     Dr. Brand Males, M.D., [facility].C.P Pulmonary and Critical Care Medicine Staff Physician [place] Pager: [phone], If no answer or between  15:00h - 7:00h: call [phone]  01/21/2019 6:48 PM     LABS    PULMONARY Recent Labs  Lab 01/20/19 1115  HCO3 35.5*  TCO2 38*  O2SAT 95.0    CBC Recent Labs  Lab 01/20/19 1024 01/20/19 1115 01/21/19 0306  HGB 9.0* 11.9* 8.6*  HCT 38.4 35.0* 34.8*  WBC 11.4*  --  10.0  PLT 260  --  243    COAGULATION No results for input(s): INR in the last 168 hours.  CARDIAC  No results for input(s): TROPONINI in the last 168 hours. No results for input(s): PROBNP in the last 168  hours.   CHEMISTRY Recent Labs  Lab 01/20/19 1024 01/20/19 1115 01/21/19 0306  NA 147* 147* 145  K 3.4* 3.4* 4.8  CL 105  --  107  CO2 31  --  30  GLUCOSE 268*  --  264*  BUN 12  --  13  CREATININE 0.78  --  0.95  CALCIUM 9.0  --  8.8*   CrCl cannot be calculated (Unknown ideal weight.).   LIVER Recent Labs  Lab 01/20/19 1024  AST 56*  ALT 50*  ALKPHOS 60  BILITOT 0.3  PROT 5.7*  ALBUMIN 3.1*     INFECTIOUS No results for input(s): LATICACIDVEN, PROCALCITON in the last 168 hours.   ENDOCRINE CBG (last 3)  Recent Labs    01/21/19 [phone]  01/21/19 1233 01/21/19 1622  GLUCAP 249* 235* 259*         IMAGING x48h  - image(s) personally visualized  -   highlighted in bold DG CHEST PORT 1 VIEW  Result Date: 01/21/2019 CLINICAL DATA:  Shortness of breath. EXAM: PORTABLE CHEST 1 VIEW COMPARISON:  Chest radiograph 01/20/2019 FINDINGS: Unchanged cardiomegaly. Redemonstrated bilateral interstitial prominence. Similar appearance of ill-defined opacity within the mid to basilar lungs bilaterally. Unchanged small to moderate left with small right pleural effusions. As before, the left pleural effusion may be partially loculated. No evidence of pneumothorax. No acute bony abnormality. Overlying cardiac monitoring leads. IMPRESSION: No significant interval change as compared to chest radiograph 01/20/2019. Persistent ill-defined opacities within the mid to basilar lungs bilaterally. Findings may reflect edema, atelectasis and/or pneumonia. Persistent left greater than right pleural effusions. As before, the left pleural effusion may be partially loculated. Cardiomegaly. Electronically Signed   By: Kellie Simmering DO   On: 01/21/2019 10:32   DG Chest Port 1 View  Result Date: 01/20/2019 CLINICAL DATA:  Shortness of breath. Additional history provided: Shortness of breath for 2 days, worsening this morning. EXAM: PORTABLE CHEST 1 VIEW COMPARISON:  Chest radiograph 01/02/2019  FINDINGS: Cardiomegaly. Redemonstrated chronic interstitial prominence. Ill-defined bibasilar opacities are slightly more conspicuous than on prior examination 11/02/2018. Persistent small bilateral pleural effusions. As before, the left pleural effusion may be partially loculated. No evidence of pneumothorax. No acute bony abnormality. Overlying cardiac monitoring leads. IMPRESSION: Cardiomegaly. Bibasilar ill-defined opacities are more conspicuous than on prior chest radiograph 11/02/2018. Findings may reflect edema, atelectasis and/or pneumonia. Persistent small bilateral pleural effusions. As before, the left pleural effusion may be partially loculated. Electronically Signed   By: Kellie Simmering DO   On: 01/20/2019 10:38

## 2019-01-21 NOTE — Progress Notes (Signed)
Patient breathing spontaneously on bipap and generating high VTs despite adjustments to IPAP.  Mode changed to CPAP 6 and tolerated well.

## 2019-01-21 NOTE — Progress Notes (Signed)
Spoke with RT concerning switching patient to [place] at home setting and taking off Bipap. Patient wanting to drink and eat. I gave PO medications and ordered lunch tray. Pt initially did fine for 30-45 minutes.  Pt anxious and wanting to call family and make sure they were aware she was in hospital. Patient could not maintain saturations while talking and moving in bed. Patient removed [place] while attempting to talk to friend briefly. Encouraged patient to breathe in through HFNC and breathe out through mouth. Educated patient on need to not drink anymore as she would need to go back on Bipap. Patient did not eat but had approximately 240 ml of water. Patient oxygen saturations remain in upper 90's while on bipap. Patient wanted to come off and eat but only waking for brief episodes. Patient wanting someone to stay in room with her. Patient has not had visitors and this RN and RT was in room multiple times throughout day. MD aware and pt given IV lasix. Pt resting with call bell within reach.  Will continue to monitor. Frequent reminders that someone is nearby.

## 2019-01-21 NOTE — Progress Notes (Signed)
Called patient friend Kennyth Lose as patient did not have cell phone and wanted to talk to her family. Kennyth Lose stated that her cell phone was at home and family was aware she was in hospital. Kennyth Lose did not have number for niece Malachy Mood. I called son Linton Rump and left voicemail to call. Patient upset that we could not look up her niece's phone number. I explained that we did not have any way to contact her unless she called or another family member contacted [place] with phone number.

## 2019-01-21 NOTE — Progress Notes (Signed)
Patient transported on BIPAP to CT and back. No complications noted. Vitals stable. RT will continue to monitor pt.

## 2019-01-21 NOTE — Progress Notes (Signed)
Inpatient Diabetes Program Recommendations  AACE/ADA: New Consensus Statement on Inpatient Glycemic Control (2015)  Target Ranges:  Prepandial:   less than 140 mg/dL      Peak postprandial:   less than 180 mg/dL (1-2 hours)      Critically ill patients:  140 - 180 mg/dL   Results for [name], [name] (MRN [id number]) as of 01/21/2019 10:21  Ref. Range 01/20/2019 17:37 01/20/2019 20:29 01/21/2019 06:55  Glucose-Capillary Latest Ref Range: 70 - 99 mg/dL 351 (H)  15 units NOVOLOG  266 (H) 249 (H)  5 units NOVOLOG     Admit with: Acute hypoxic and hypercapnic respiratory failure, due to COPD exacerbation  History: DM, COPD, CHF  Home DM Meds: Lantus 32 units QHS  Current Orders: Novolog Moderate Correction Scale/ SSI (0-15 units) TID AC       Patient got 125 mg Solumedrol X 1 dose yesterday at 10:39am  Now getting Solumedrol 40 mg Q6 hours.    MD- Patient takes Lantus at home.  CBG 249 mg/dl this AM.  Please consider starting Lantus 25 units Daily (80% total home dose)     --Will follow patient during hospitalization--  Wyn Quaker RN, MSN, CDE Diabetes Coordinator Inpatient Glycemic Control Team Team Pager: [phone] (8a-5p)

## 2019-01-21 NOTE — Evaluation (Signed)
Physical Therapy Evaluation Patient Details Name: [name] MRN: [id number] DOB: [birth year] Today's Date: 01/21/2019   History of Present Illness  73 y.o. female with medical history significant of COPD baseline 6 L of oxygen at all times, bronchiectasis, diastolic CHF, HLD, HTN, mediastinal mass, CT, pulmonary nodules, Tobacco abuse psoriasis, GERD, diabetes, presented with increasing short of breath since yesterday, with worsening of wheezing and occasional dry cough.  Clinical Impression  Pt presents to PT with deficits in functional mobility, gait, balance, endurance, strength, power. Pt is a limited participant in session, on BiPAPA limiting communication, and perseverating about being cold. Pt denies pain in sitting, only requesting to lay down due to being cold. Pt spontaneously begins sit to supine after 2 minutes sitting edge of bed. Pt history obtained from recent previous admission. Pt will benefit from continued acute PT POC to improve activity tolerance and further assess mobility.    Follow Up Recommendations SNF;Supervision/Assistance - 24 hour    Equipment Recommendations  (defer to post-acute setting)    Recommendations for Other Services       Precautions / Restrictions Precautions Precautions: Fall Restrictions Weight Bearing Restrictions: No      Mobility  Bed Mobility Overal bed mobility: Needs Assistance Bed Mobility: Sit to Supine;Supine to Sit     Supine to sit: Total assist;+2 for physical assistance;HOB elevated Sit to supine: Max assist;+2 for physical assistance      Transfers Overall transfer level: (deferred)                  Ambulation/Gait                Stairs            Wheelchair Mobility    Modified Rankin (Stroke Patients Only)       Balance Overall balance assessment: Needs assistance Sitting-balance support: Bilateral upper extremity supported Sitting balance-Leahy Scale: Fair Sitting balance -  Comments: minA-minG for 2 minutes                                     Pertinent Vitals/Pain Pain Assessment: No/denies pain    Home Living Family/patient expects to be discharged to:: Private residence Living Arrangements: Alone Available Help at Discharge: Personal care attendant(2x/week) Type of Home: Apartment Home Access: Stairs to enter Entrance Stairs-Rails: Right Entrance Stairs-Number of Steps: 2 Home Layout: One level Home Equipment: Walker - 4 wheels;Bedside commode;Adaptive equipment;Other (comment)      Prior Function Level of Independence: Needs assistance   Gait / Transfers Assistance Needed: walks with a rollator  ADL's / Homemaking Assistance Needed: aide helps with heavy meal prep, housekeeping and bathing  Comments: uses rollator, has family get groceries     Hand Dominance   Dominant Hand: Right    Extremity/Trunk Assessment   Upper Extremity Assessment Upper Extremity Assessment: Generalized weakness    Lower Extremity Assessment Lower Extremity Assessment: Generalized weakness    Cervical / Trunk Assessment Cervical / Trunk Assessment: Other exceptions(body habitus)  Communication   Communication: Other (comment)(Bi-PAP)  Cognition Arousal/Alertness: Lethargic(arouses to stimuli) Behavior During Therapy: Restless;Anxious Overall Cognitive Status: No family/caregiver present to determine baseline cognitive functioning                                 General Comments: pt on BiPAP impairing communication. Pt perseverating over being cold,  responding in limited fashion to history questions      General Comments General comments (skin integrity, edema, etc.): Pt on BiPAP, perseverating on being cold. Pt a limited participant in evaluation, not consistently responding to PT questions    Exercises     Assessment/Plan    PT Assessment Patient needs continued PT services  PT Problem List Decreased  strength;Decreased activity tolerance;Decreased balance;Decreased mobility;Decreased knowledge of use of DME;Decreased safety awareness;Decreased knowledge of precautions;Cardiopulmonary status limiting activity;Obesity       PT Treatment Interventions DME instruction;Gait training;Stair training;Functional mobility training;Therapeutic activities;Therapeutic exercise;Balance training;Neuromuscular re-education;Patient/family education    PT Goals (Current goals can be found in the Care Plan section)  Acute Rehab PT Goals Patient Stated Goal: Pt unable to state PT Goal Formulation: With patient Time For Goal Achievement: 02/04/19 Potential to Achieve Goals: Fair    Frequency Min 2X/week   Barriers to discharge        Co-evaluation               AM-PAC PT "6 Clicks" Mobility  Outcome Measure Help needed turning from your back to your side while in a flat bed without using bedrails?: Total Help needed moving from lying on your back to sitting on the side of a flat bed without using bedrails?: Total Help needed moving to and from a bed to a chair (including a wheelchair)?: Total Help needed standing up from a chair using your arms (e.g., wheelchair or bedside chair)?: Total Help needed to walk in hospital room?: Total Help needed climbing 3-5 steps with a railing? : Total 6 Click Score: 6    End of Session Equipment Utilized During Treatment: Oxygen Activity Tolerance: Treatment limited secondary to medical complications (Comment);Patient limited by fatigue;Other (comment)(pt cold) Patient left: in bed;with call bell/phone within reach;with bed alarm set Nurse Communication: Mobility status PT Visit Diagnosis: Muscle weakness (generalized) (M62.81)    Time: [id number] PT Time Calculation (min) (ACUTE ONLY): 11 min   Charges:   PT Evaluation $PT Eval Moderate Complexity: 1 Mod          Zenaida Niece, PT, DPT Acute Rehabilitation Pager: [phone]   Zenaida Niece 01/21/2019, 12:28 PM

## 2019-01-21 NOTE — Progress Notes (Signed)
Initial Nutrition Assessment  DOCUMENTATION CODES:   Not applicable  INTERVENTION:   Magic cup TID with meals, each supplement provides 290 kcal and 9 grams of protein  Encourage PO intake as able    NUTRITION DIAGNOSIS:   Increased nutrient needs related to chronic illness(severe COPD) as evidenced by estimated needs.  GOAL:   Patient will meet greater than or equal to 90% of their needs  MONITOR:   PO intake, Supplement acceptance  REASON FOR ASSESSMENT:   Consult Assessment of nutrition requirement/status, COPD Protocol  ASSESSMENT:   Pt with PMH of Gold stage IV severe COPD on 6L O2 at home, bronchiectasis, CHF, HLD, HTN, mediastinal mass and pulmonary nodules on previous CTs, tobacco abuse, psoriasis, GERD, poorly controlled DM now admitted with increasing SOB and COPD exacerbation.   Remains on BiPAP at this time Noted blood sugars elevated on admission due to poorly controlled DM PTA and IV steroids will need insulin regimen to control blood sugars.   Medications reviewed and include: lasix, novolog SSI TID with meals, solumedrol Labs reviewed:  CBG's: 351-266-249    NUTRITION - FOCUSED PHYSICAL EXAM:  Deferred   Diet Order:   Diet Order            Diet Carb Modified Fluid consistency: Thin; Room service appropriate? Yes; Fluid restriction: 2000 mL Fluid  Diet effective now              EDUCATION NEEDS:   No education needs have been identified at this time  Skin:  Skin Assessment: (MASD: breast and groin)  Last BM:  12/30  Height:   Ht Readings from Last 1 Encounters:  09/05/18 5\' 3"  (1.6 m)    Weight:   Wt Readings from Last 1 Encounters:  11/06/18 95.5 kg    Ideal Body Weight:  52.2 kg  BMI:  There is no height or weight on file to calculate BMI.  Estimated Nutritional Needs:   Kcal:  1800-2000  Protein:  90-110 grams  Fluid:  >1.8 L/day  Maylon Peppers RD, LDN, CNSC [phone] Pager [phone] After Hours Pager

## 2019-01-21 NOTE — Patient Outreach (Signed)
[facility]) Care Management  01/21/2019  [name] September 22, 1945 [id number]   Per chart review, patient has been unable to contact, admitted to the hospital on 01/20/2019, and remains inpatient.   No patient outreach at this time due to hospitalization.  Patient referred to [place] ( [facility], [place], and [place]) for discharge  planning / disposition, and referral follow up.    [name] H. [name], BSN, [place] Management [facility] Telephonic CM Phone: [phone] Fax: [phone]

## 2019-01-21 NOTE — Evaluation (Signed)
Occupational Therapy Evaluation Patient Details Name: [name] MRN: [id number] DOB: 03-Jul-1945 Today's Date: 01/21/2019    History of Present Illness 73 y.o. female with medical history significant of COPD baseline 6 L of oxygen at all times, bronchiectasis, diastolic CHF, HLD, HTN, mediastinal mass, CT, pulmonary nodules, Tobacco abuse psoriasis, GERD, diabetes, presented with increasing short of breath since yesterday, with worsening of wheezing and occasional dry cough.   Clinical Impression   Pt with decline in function and safety with ADLs and ADL mobility with impaired strength, balance and endurance. Eval limited by cognition and fatigue, on BiPAPA limiting communication, and perseverating about being cold. Pt denies pain in sitting, only requesting to lay down due to being cold. Pt spontaneously begins sit to supine after 2 minutes sitting edge of bed. Pt history obtained from recent previous admission. Pt will benefit from acute OT services to address impairments to maximize level of function and safety   Follow Up Recommendations  SNF    Equipment Recommendations  None recommended by OT    Recommendations for Other Services       Precautions / Restrictions Precautions Precautions: Fall Restrictions Weight Bearing Restrictions: No      Mobility Bed Mobility Overal bed mobility: Needs Assistance Bed Mobility: Sit to Supine;Supine to Sit     Supine to sit: Total assist;+2 for physical assistance;HOB elevated Sit to supine: Max assist;+2 for physical assistance      Transfers Overall transfer level: (deferred)               General transfer comment: deferred    Balance Overall balance assessment: Needs assistance Sitting-balance support: Bilateral upper extremity supported Sitting balance-Leahy Scale: Fair Sitting balance - Comments: min - min guard A x 2 minutes                                   ADL either performed or assessed with  clinical judgement   ADL Overall ADL's : Needs assistance/impaired Eating/Feeding: Maximal assistance   Grooming: Wash/dry hands;Wash/dry face;Sitting;Maximal assistance   Upper Body Bathing: Total assistance   Lower Body Bathing: Total assistance   Upper Body Dressing : Total assistance   Lower Body Dressing: Total assistance     Toilet Transfer Details (indicate cue type and reason): deferred Toileting- Clothing Manipulation and Hygiene: Total assistance;Bed level         General ADL Comments: pt with Poor acitivity tolerance, eyes closed most of session     Vision   Additional Comments: unable to properly assess at this time, pt kept eyes closed most of session     Perception     Praxis      Pertinent Vitals/Pain Pain Assessment: No/denies pain     Hand Dominance Right   Extremity/Trunk Assessment Upper Extremity Assessment Upper Extremity Assessment: Generalized weakness   Lower Extremity Assessment Lower Extremity Assessment: Defer to PT evaluation   Cervical / Trunk Assessment Cervical / Trunk Assessment: Other exceptions(body habitus)   Communication Communication Communication: Other (comment)(Bi Pap)   Cognition Arousal/Alertness: Lethargic Behavior During Therapy: Restless;Anxious Overall Cognitive Status: No family/caregiver present to determine baseline cognitive functioning                                 General Comments: pt on BiPAP impairing communication. Pt perseverating over being cold, responding in limited fashion to history questions  General Comments  Pt on BiPAP, perseverating on being cold. Pt a limited participant in evaluation, not consistently responding to PT questions    Exercises     Shoulder Instructions      Home Living Family/patient expects to be discharged to:: Private residence Living Arrangements: Alone Available Help at Discharge: Personal care attendant Type of Home: Apartment Home Access:  Stairs to enter [employer] of Steps: 2 Entrance Stairs-Rails: Right Home Layout: One level     Bathroom Shower/Tub: Teacher, early years/pre: [place]: Environmental consultant - 4 wheels;Bedside commode;Adaptive equipment;Other (comment) Adaptive Equipment: Reacher        Prior Functioning/Environment Level of Independence: Needs assistance  Gait / Transfers Assistance Needed: walks with a rollator ADL's / Homemaking Assistance Needed: aide helps with heavy meal prep, housekeeping and bathing   Comments: uses rollator, has family get groceries        OT Problem List: Decreased strength;Impaired balance (sitting and/or standing);Decreased cognition;Decreased activity tolerance;Decreased knowledge of use of DME or AE;Cardiopulmonary status limiting activity      OT Treatment/Interventions: Self-care/ADL training;DME and/or AE instruction;Therapeutic activities;Balance training;Therapeutic exercise;Energy conservation;Patient/family education    OT Goals(Current goals can be found in the care plan section) Acute Rehab OT Goals Patient Stated Goal: none stated OT Goal Formulation: With patient/family Time For Goal Achievement: 02/04/19 Potential to Achieve Goals: Good ADL Goals Pt Will Perform Grooming: with mod assist;with min assist;sitting Pt Will Perform Upper Body Bathing: with max assist;with mod assist;sitting Pt Will Perform Upper Body Dressing: with max assist;with mod assist;sitting Pt Will Transfer to Toilet: with max assist;with +2 assist;stand pivot transfer;bedside commode Additional ADL Goal #1: Pt will complete bed mobility with max A to sit EOB for ADL/selfcare tasks  OT Frequency: Min 2X/week   Barriers to D/C: Decreased caregiver support          Co-evaluation PT/OT/SLP Co-Evaluation/Treatment: Yes Reason for Co-Treatment: Complexity of the patient's impairments (multi-system involvement);For patient/therapist safety;To address  functional/ADL transfers;Necessary to address cognition/behavior during functional activity PT goals addressed during session: Mobility/safety with mobility;Balance;Strengthening/ROM OT goals addressed during session: ADL's and self-care      AM-PAC OT "6 Clicks" Daily Activity     Outcome Measure Help from another person eating meals?: A Lot Help from another person taking care of personal grooming?: A Lot Help from another person toileting, which includes using toliet, bedpan, or urinal?: Total Help from another person bathing (including washing, rinsing, drying)?: Total Help from another person to put on and taking off regular upper body clothing?: Total Help from another person to put on and taking off regular lower body clothing?: Total 6 Click Score: 8   End of Session Equipment Utilized During Treatment: Oxygen  Activity Tolerance: Patient limited by fatigue Patient left: in bed;with call bell/phone within reach;with bed alarm set  OT Visit Diagnosis: Muscle weakness (generalized) (M62.81);Other symptoms and signs involving cognitive function                Time: [id number] OT Time Calculation (min): 11 min Charges:  OT General Charges $OT Visit: 1 Visit OT Evaluation $OT Eval Moderate Complexity: 1 Mod    Britt Bottom 01/21/2019, 1:15 PM

## 2019-01-21 NOTE — Progress Notes (Signed)
PROGRESS NOTE    [name]  [id number] DOB: Nov 26, 1945 DOA: 01/20/2019 PCP: Binnie Rail, MD  Brief Narrative:  HPI per Dr. [name] on 01/20/2019 [name] is a 73 y.o. female with medical history significant of COPD baseline 6 L of oxygen at all times, bronchiectasis, diastolic CHF, HLD, HTN, mediastinal mass, CT, pulmonary nodules, Tobacco abuse psoriasis, GERD, diabetes, presented with increasing short of breath since yesterday, with worsening of wheezing and occasional dry cough no fevers no chills no chest pain no leg edema she denied any Covid contact.  Denied any muscle ache night sweat, dysuria diarrhea.  ED Course: ABG showed mixed hypoxia and hypercapnia, received Solu-Medrol and bronchodilator.  **Interim History  Patient continues to remain on the BiPAP this morning and was transitioned to high flow nasal cannula however had to go back on the BiPAP after a little while.  She was placed on 6 to 8 L.  We will give an additional dose of IV Lasix and will obtain a CT of the chest without contrast and a pulmonary consult.  Assessment & Plan:   Active Problems:   COPD (chronic obstructive pulmonary disease) (HCC)   Chronic respiratory failure (HCC)   COPD exacerbation (HCC)  Acute on Chronic Hypoxic and Hypercapnic respiratory failure, due to COPD exacerbation and bronchiectasis possible concomitant acute on chronic diastolic CHF exacerbation -Admit to progressive unit inpatient given her use of BiPAP; had to be placed on a nonrebreather and she desaturated down to 70% when it was taken off -At baseline patient wears 6 L of nasal cannula -Titrate oxygen sat O2 saturation 91 to 93%.   -My colleague Discussed with ED physician regarding using BiPAP,  confirmation Covid test sent, if negative patient will be placed on BiPAP.   -May need Trilogy to go home, hypercapnia seems like a chronic problem, as her Ph is compensated.    Had to be placed back on BiPAP 10/5 to  assist with adequate ventilation -CXR showed "No significant interval change as compared to chest radiograph 01/20/2019.  Persistent ill-defined opacities within the mid to basilar lungs bilaterally. Findings may reflect edema, atelectasis and/or pneumonia. Persistent left greater than right pleural effusions. As before, the left pleural effusion may be partially loculated. Cardiomegaly." -Obtaining a Pulm Consult but likely first will obtain a CT of the Chest to further delineate  -She had an ABG done which showed a pH of 7.315, PCO2 of 69.6, PO2 of 85.0, bicarbonate level 35.5, and an O2 saturation of 95% -On p.o. doxycycline and will continue -IV Solu-Medrol dosing was increased from 40 every 6h to 60 mg every 8h,  -We will discontinue inhalers and continue with nebulizers breathing meds.  Held Kellogg and Incruse Ellipta and started the patient on Brovana and budesonide and will continue DuoNeb 3 mL every 6 scheduled   -Continue p.o. acetazolamide to 50 mg p.o. twice daily as well as p.o furosemide and will give a dose of IV/40 g x 1.   -Decrease her Xanax from 1 mg to 0.25 mg 3 times daily as needed, -Discontinue Benadryl at bedtime. -Continue with guaifenesin 600 mg p.o. twice daily -Continue with albuterol inhaler 2 puffs elations every 6 as needed for wheezing or shortness of breath -SARS-CoV-2 Testing Negative (POC and PCR)  -We will need an ambulatory home O2 screen prior to discharge -Repeat chest x-ray in the a.m.  -Continue with incentive spirometry, and flutter valve  Poorly Controlled Diabetes Mellitus Type 2 -We will reduce home  Lantus dosing from 25 units to 20 units -Continue with moderate NovoLog/scale insulin AC -Sugars are likely to elevate in the setting of steroid demargination and have been ranging from 225-351 -Recent hemoglobin A1c done on 11/02/2018 was 9.4 next-continue monitor blood sugars carefully and adjust insulin as necessary -Diabetes education  coordinator has been consulted for further evaluation recommendations  Chronic Microcytic Anemia -Patient's Hb/Hct went from 11.9/35.0 -> 8.6/34.8 -Check anemia panel showed an iron level of 15, U IBC of 427, TIBC 442, saturation ratios of 3%, ferritin level 7, and vitamin B12 level 311 -We will start the patient on iron polysaccharides 150 mg p.o. daily -Check FOBT -Continue to monitor for signs and symptoms of bleeding; currently no overt bleeding noted -Repeat CBC in a.m.  Acute on Chronic Diastolic CHF -Symptoms initially felt to be mainly due to COPD exacerbation and her x-ray looks no significant changes compared to that in October this year examination she appears a little volume overloaded and she does have cardiomegaly -BNP was 106.9 -Given a dose of IV Lasix in addition to her home p.o. Lasix -Strict I's and O's and daily weights; may need to fluid restrict next-continue monitor for signs and symptoms of volume overload and repeat a chest x-ray in the a.m.  Pulmonary nodules, bilateral lower and mediastinal lymphadenosis/mass -Uunknown etiology, according to Pulm/Critical Care note in October; he is Dr. Fuller Plan -Had had PET scan in 2017 showed malignant features, and patient could not tolerate bronchoscopy. -Normally maintained on Trelegy Ellipta and 20 mg of p.o. prednisone daily -Will get Pulmonary Involved and obtain a CT Chest w/o Contrast   Hypernatremia -Improved. -? Hypervolemic Hypernatremia -Given an additional dose of IV Lasix this AM  -Continue to Monitor and Trend -Repeat CMP in AM   Hypokalemia -Improved as K+ is now 4.8 -Continue to Monitor and Replete as Necessary -Repeat CMP in AM   Deconditioning -Patient under palliative care on last admission, her COPD looks very advanced, - Gold stage IV, FEV1/FVC 47% in August 2020.   -Will need close pulmonary follow-up and may decide to consult this Admission -Will need PT/OT evaluation and they  are recommending skilled nursing facility -Get SLP evaluation as well given her difficulty with swallowing pills yesterday due to her respiratory status  Abnormal LFT's -Patient's AST was 56 and ALT was 50 -Continue to Monitor and Trend and if remain elevated or not improving will obtain RUQ U/S and Acute Hepatitis Panel  -Repeat CMP in the AM   Leukocytosis -Improved as WBC went from 11.4 -> 10.0 -Continue to Monitor and Trend -Repeat CBC in AM   Anxiety/generalized anxiety disorder -Continue with anxiolytics at the reduced dose  Obesity -Estimated body mass index is 37.31 kg/m as calculated from the following:   Height as of 09/05/18: '5\' 3"'$  (1.6 m).   Weight as of 11/06/18: 95.5 kg. -Weight Loss and Dietary Counseling ginve  DVT prophylaxis: (Lovenox/Heparin/SCD's/anticoagulated/None (if comfort care) Code Status: Partial Code but was documented as a FULL CODE 7 days ago so will need to Clarify  Family Communication: No family present at bedside  Disposition Plan: Ending further clinical improvement.   Consultants:   PCCM/pulmonary   Procedures: None   Antimicrobials:  Anti-infectives (From admission, onward)   Start     Dose/Rate Route Frequency Ordered Stop   01/20/19 1330  doxycycline (VIBRA-TABS) tablet 100 mg     100 mg Oral Every 12 hours 01/20/19 1325 01/25/19 0959     Subjective: Seen and examined  this morning and she is somnolent and sleeping on the BiPAP.  I tried to arouse her and she did not want me to speak with her and she wanted to be left alone asleep.  No nausea or vomiting.  Nursing states that she woke up later on and was transitioned to high flow nasal cannula but then had to go back on the BiPAP due to dyspnea.  No lightheadedness or dizziness.  No other concerns complaints at this time.  Will consult pulmonary for further evaluation.  Objective: Vitals:   01/21/19 0247 01/21/19 0400 01/21/19 0430 01/21/19 0742  BP:   (!) 106/59   Pulse: 98 90  94 91  Resp: (!) 26 18 (!) 21   Temp:   99 F (37.2 C)   TempSrc:   Axillary   SpO2: 97% 97% 97% 98%    Intake/Output Summary (Last 24 hours) at 01/21/2019 0746 Last data filed at 01/21/2019 0403 Gross per 24 hour  Intake 512.59 ml  Output 250 ml  Net 262.59 ml   There were no vitals filed for this visit.  Examination: Physical Exam:  Constitutional: WN/WD obese female currently in some mild respiratory distress on the BiPAP and appears a little uncomfortable Eyes: Lids and conjunctivae normal, sclerae anicteric  ENMT: External Ears, Nose appear normal. Grossly normal hearing. Mucous membranes are moist.  Neck: Appears normal, supple, no cervical masses, normal ROM, no appreciable thyromegaly; difficult to assess JVD status given her body habitus Respiratory: Significantly diminished to auscultation bilaterally with coarse breath sounds and some crackles; slight expiratory wheezing but no appreciable rhonchi.  Increased respiratory effort and is wearing the BiPAP  Cardiovascular: RRR, has a 2/6 systolic murmur.  1+ extremity edema.  Abdomen: Soft, non-tender, Distended 2/2 body habitus. Bowel sounds positive x4.  GU: Deferred. Musculoskeletal: No clubbing / cyanosis of digits/nails. No joint deformity upper and lower extremities. .  Skin: No rashes, lesions, ulcers on limited skin evaluation. No induration; Warm and dry.  Neurologic: CN 2-12 grossly intact with no focal deficits. Romberg sign and cerebellar reflexes not assessed.  Psychiatric: Normal judgment and insight. Alert and oriented x 3.  Slightly somnolent mood and appropriate affect.   Data Reviewed: I have personally reviewed following labs and imaging studies  CBC: Recent Labs  Lab 01/20/19 1024 01/20/19 1115 01/21/19 0306  WBC 11.4*  --  10.0  NEUTROABS 8.2*  --   --   HGB 9.0* 11.9* 8.6*  HCT 38.4 35.0* 34.8*  MCV 78.9*  --  76.5*  PLT 260  --  211   Basic Metabolic Panel: Recent Labs  Lab  01/20/19 1024 01/20/19 1115 01/21/19 0306  NA 147* 147* 145  K 3.4* 3.4* 4.8  CL 105  --  107  CO2 31  --  30  GLUCOSE 268*  --  264*  BUN 12  --  13  CREATININE 0.78  --  0.95  CALCIUM 9.0  --  8.8*   GFR: CrCl cannot be calculated (Unknown ideal weight.). Liver Function Tests: Recent Labs  Lab 01/20/19 1024  AST 56*  ALT 50*  ALKPHOS 60  BILITOT 0.3  PROT 5.7*  ALBUMIN 3.1*   No results for input(s): LIPASE, AMYLASE in the last 168 hours. No results for input(s): AMMONIA in the last 168 hours. Coagulation Profile: No results for input(s): INR, PROTIME in the last 168 hours. Cardiac Enzymes: No results for input(s): CKTOTAL, CKMB, CKMBINDEX, TROPONINI in the last 168 hours. BNP (last 3 results)  No results for input(s): PROBNP in the last 8760 hours. HbA1C: No results for input(s): HGBA1C in the last 72 hours. CBG: Recent Labs  Lab 01/20/19 1737 01/20/19 2029 01/21/19 0655  GLUCAP 351* 266* 249*   Lipid Profile: No results for input(s): CHOL, HDL, LDLCALC, TRIG, CHOLHDL, LDLDIRECT in the last 72 hours. Thyroid Function Tests: No results for input(s): TSH, T4TOTAL, FREET4, T3FREE, THYROIDAB in the last 72 hours. Anemia Panel: Recent Labs    01/20/19 1350  VITAMINB12 311  FERRITIN 7*  TIBC 442  IRON 15*  RETICCTPCT 2.0   Sepsis Labs: No results for input(s): PROCALCITON, LATICACIDVEN in the last 168 hours.  Recent Results (from the past 240 hour(s))  SARS CORONAVIRUS 2 (TAT 6-24 HRS) Nasopharyngeal Nasopharyngeal Swab     Status: None   Collection Time: 01/20/19  1:33 PM   Specimen: Nasopharyngeal Swab  Result Value Ref Range Status   SARS Coronavirus 2 NEGATIVE NEGATIVE Final    Comment: (NOTE) SARS-CoV-2 target nucleic acids are NOT DETECTED. The SARS-CoV-2 RNA is generally detectable in upper and lower respiratory specimens during the acute phase of infection. Negative results do not preclude SARS-CoV-2 infection, do not rule out co-infections  with other pathogens, and should not be used as the sole basis for treatment or other patient management decisions. Negative results must be combined with clinical observations, patient history, and epidemiological information. The expected result is Negative. Fact Sheet for Patients: SugarRoll.be Fact Sheet for Healthcare Providers: https://www.woods-mathews.com/ This test is not yet approved or cleared by the [place] FDA and  has been authorized for detection and/or diagnosis of SARS-CoV-2 by FDA under an Emergency Use Authorization (EUA). This EUA will remain  in effect (meaning this test can be used) for the duration of the COVID-19 declaration under Section 56 4(b)(1) of the Act, 21 U.S.C. section 360bbb-3(b)(1), unless the authorization is terminated or revoked sooner. Performed at [facility], [place] [address] [place], [place] [id number]      RN Pressure Injury Documentation: Pressure Injury 08/30/18 Buttocks Right;Medial Stage II -  Partial thickness loss of dermis presenting as a shallow open ulcer with a red, pink wound bed without slough. (Active)  08/30/18 2134  Location: Buttocks  Location Orientation: Right;Medial  Staging: Stage II -  Partial thickness loss of dermis presenting as a shallow open ulcer with a red, pink wound bed without slough.  Wound Description (Comments):   Present on Admission:    Radiology Studies: DG Chest Port 1 View  Result Date: 01/20/2019 CLINICAL DATA:  Shortness of breath. Additional history provided: Shortness of breath for 2 days, worsening this morning. EXAM: PORTABLE CHEST 1 VIEW COMPARISON:  Chest radiograph 01/02/2019 FINDINGS: Cardiomegaly. Redemonstrated chronic interstitial prominence. Ill-defined bibasilar opacities are slightly more conspicuous than on prior examination 11/02/2018. Persistent small bilateral pleural effusions. As before, the left pleural effusion may be partially  loculated. No evidence of pneumothorax. No acute bony abnormality. Overlying cardiac monitoring leads. IMPRESSION: Cardiomegaly. Bibasilar ill-defined opacities are more conspicuous than on prior chest radiograph 11/02/2018. Findings may reflect edema, atelectasis and/or pneumonia. Persistent small bilateral pleural effusions. As before, the left pleural effusion may be partially loculated. Electronically Signed   By: Kellie Simmering DO   On: 01/20/2019 10:38   Scheduled Meds: . acetaZOLAMIDE  250 mg Oral BID  . chlorhexidine gluconate (MEDLINE KIT)  15 mL Mouth Rinse BID  . doxycycline  100 mg Oral Q12H  . enoxaparin (LOVENOX) injection  40 mg Subcutaneous Q24H  .  fluticasone  1 spray Each Nare Daily  . fluticasone furoate-vilanterol  1 puff Inhalation Daily  . furosemide  40 mg Oral Daily  . guaiFENesin  600 mg Oral BID  . insulin aspart  0-15 Units Subcutaneous TID WC  . ipratropium-albuterol  3 mL Nebulization Q6H  . methylPREDNISolone (SOLU-MEDROL) injection  40 mg Intravenous Q6H  . pantoprazole  40 mg Oral Daily  . PARoxetine  40 mg Oral q morning - 10a  . umeclidinium bromide  1 puff Inhalation Daily   Continuous Infusions: . sodium chloride       LOS: 1 day   Kerney Elbe, DO [facility] PAGER is on AMION  If 7PM-7AM, please contact night-coverage [email or web address]

## 2019-01-21 NOTE — Progress Notes (Signed)
Transferred from ED, alert and oriented to self, and place,disoriented to time and situations, followed commands. On BiPaP with RT at bedside on arrival. Afebrile, SPO2 100%, BP 116/54 mmHg, HR 99-110, sinus rhythm to sinus tachycardia on monitor, RR 20-28, Denied pain.   CCMD called with 2nd person verified. CHG bath given, call bell with in reach, room equipments instructions given.    She's incontinent with bowel and urine. PureWIck in placed. Will continue to monitor.  Sanaiyah Kirchhoff [phone]

## 2019-01-21 NOTE — Progress Notes (Signed)
Patient placed back on BIPAP 10/5 to assure adequate ventilation.  VT are in the 600 ml range.

## 2019-01-22 ENCOUNTER — Encounter (HOSPITAL_COMMUNITY): Payer: Self-pay | Admitting: Internal Medicine

## 2019-01-22 ENCOUNTER — Other Ambulatory Visit: Payer: Self-pay

## 2019-01-22 ENCOUNTER — Inpatient Hospital Stay (HOSPITAL_COMMUNITY): Payer: Medicare Other

## 2019-01-22 DIAGNOSIS — J9612 Chronic respiratory failure with hypercapnia: Secondary | ICD-10-CM

## 2019-01-22 DIAGNOSIS — J9611 Chronic respiratory failure with hypoxia: Secondary | ICD-10-CM

## 2019-01-22 LAB — CBC WITH DIFFERENTIAL/PLATELET
Abs Immature Granulocytes: 0.06 10*3/uL (ref 0.00–0.07)
Basophils Absolute: 0 10*3/uL (ref 0.0–0.1)
Basophils Relative: 0 %
Eosinophils Absolute: 0 10*3/uL (ref 0.0–0.5)
Eosinophils Relative: 0 %
HCT: 34.3 % — ABNORMAL LOW (ref 36.0–46.0)
Hemoglobin: 8.6 g/dL — ABNORMAL LOW (ref 12.0–15.0)
Immature Granulocytes: 1 %
Lymphocytes Relative: 5 %
Lymphs Abs: 0.6 10*3/uL — ABNORMAL LOW (ref 0.7–4.0)
MCH: 18.8 pg — ABNORMAL LOW (ref 26.0–34.0)
MCHC: 25.1 g/dL — ABNORMAL LOW (ref 30.0–36.0)
MCV: 74.9 fL — ABNORMAL LOW (ref 80.0–100.0)
Monocytes Absolute: 0.7 10*3/uL (ref 0.1–1.0)
Monocytes Relative: 5 %
Neutro Abs: 11 10*3/uL — ABNORMAL HIGH (ref 1.7–7.7)
Neutrophils Relative %: 89 %
Platelets: 250 10*3/uL (ref 150–400)
RBC: 4.58 MIL/uL (ref 3.87–5.11)
RDW: 20.1 % — ABNORMAL HIGH (ref 11.5–15.5)
WBC: 12.3 10*3/uL — ABNORMAL HIGH (ref 4.0–10.5)
nRBC: 1.1 % — ABNORMAL HIGH (ref 0.0–0.2)

## 2019-01-22 LAB — GLUCOSE, CAPILLARY
Glucose-Capillary: 208 mg/dL — ABNORMAL HIGH (ref 70–99)
Glucose-Capillary: 230 mg/dL — ABNORMAL HIGH (ref 70–99)
Glucose-Capillary: 243 mg/dL — ABNORMAL HIGH (ref 70–99)
Glucose-Capillary: 212 mg/dL — ABNORMAL HIGH (ref 70–99)

## 2019-01-22 LAB — COMPREHENSIVE METABOLIC PANEL
ALT: 56 U/L — ABNORMAL HIGH (ref 0–44)
AST: 29 U/L (ref 15–41)
Albumin: 3.1 g/dL — ABNORMAL LOW (ref 3.5–5.0)
Alkaline Phosphatase: 54 U/L (ref 38–126)
Anion gap: 10 (ref 5–15)
BUN: 17 mg/dL (ref 8–23)
CO2: 30 mmol/L (ref 22–32)
Calcium: 8.9 mg/dL (ref 8.9–10.3)
Chloride: 102 mmol/L (ref 98–111)
Creatinine, Ser: 0.86 mg/dL (ref 0.44–1.00)
GFR calc Af Amer: >60 mL/min (ref >60)
GFR calc non Af Amer: >60 mL/min (ref >60)
Glucose, Bld: 243 mg/dL — ABNORMAL HIGH (ref 70–99)
Potassium: 3.8 mmol/L (ref 3.5–5.1)
Sodium: 142 mmol/L (ref 135–145)
Total Bilirubin: 0.3 mg/dL (ref 0.3–1.2)
Total Protein: 5.8 g/dL — ABNORMAL LOW (ref 6.5–8.1)

## 2019-01-22 LAB — RESPIRATORY PANEL BY PCR

## 2019-01-22 LAB — PHOSPHORUS: Phosphorus: 3 mg/dL (ref 2.5–4.6)

## 2019-01-22 LAB — MAGNESIUM: Magnesium: 2.5 mg/dL — ABNORMAL HIGH (ref 1.7–2.4)

## 2019-01-22 MED ORDER — FUROSEMIDE 10 MG/ML IJ SOLN
40.0000 mg | Freq: Once | INTRAMUSCULAR | Status: AC
  Administered 2019-01-22: 40 mg via INTRAVENOUS
  Filled 2019-01-22: qty 4

## 2019-01-22 NOTE — Progress Notes (Signed)
PROGRESS NOTE    [name]  FSE:[id number] DOB: 10/14/1945 DOA: 01/20/2019 PCP: Binnie Rail, MD  Brief Narrative:  HPI per Dr. [name] on 01/20/2019 [name] is a 74 y.o. female with medical history significant of COPD baseline 6 L of oxygen at all times, bronchiectasis, diastolic CHF, HLD, HTN, mediastinal mass, CT, pulmonary nodules, Tobacco abuse psoriasis, GERD, diabetes, presented with increasing short of breath since yesterday, with worsening of wheezing and occasional dry cough no fevers no chills no chest pain no leg edema she denied any Covid contact.  Denied any muscle ache night sweat, dysuria diarrhea.  ED Course: ABG showed mixed hypoxia and hypercapnia, received Solu-Medrol and bronchodilator.  **Interim History  Patient continues to remain on the BiPAP this morning and was transitioned to high flow nasal cannula however had to go back on the BiPAP after a little while.  She was placed on 6 to 8 L. Yesterday she was given additional dose of IV Lasix and obtained a CT of the chest without contrast and a pulmonary consult.  CT of the chest showed a trace left-sided pleural effusion with a small right-sided pleural effusion with partial consolidations at the right greater than left lung bases which could reflect atelectasis or pneumonia.  Patient does have advanced emphysema and stable scattered pulmonary nodules in the right middle and upper lobes measuring up to 5 mm.  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  I spoke with pulmonary and her respiratory virus panel was negative and they feel that her lung function is further compromised with the presence of the pleural effusion and Dr. [name] believes that she may be a terminal decline.  He recommends continue the BiPAP for now and recommends okay to give concomitant opioids and anxiolytics for  symptom relief and eat if tolerated.  They are checking in a acetylcholine receptor antibody to rule out reversible causes and he recommended obtaining palliative care conversation having goals of care discussion with the family since he believes that this hospitalization may be a terminal event.   Assessment & Plan:   Active Problems:   COPD (chronic obstructive pulmonary disease) (HCC)   Chronic respiratory failure (HCC)   COPD exacerbation (HCC)  Acute on Chronic Hypoxic and Hypercapnic respiratory failure, due to COPD exacerbation and bronchiectasis possible concomitant acute on chronic diastolic CHF exacerbation -Admit to progressive unit inpatient given her use of BiPAP; had to be placed on a nonrebreather and she desaturated down to 70% when it was taken off -At baseline patient wears 6 L of nasal cannula -Titrate oxygen sat O2 saturation 91 to 93%.   -My colleague Discussed with ED physician regarding using BiPAP,  confirmation Covid test sent, if negative patient will be placed on BiPAP.   -May need Trilogy to go home, hypercapnia seems like a chronic problem, as her Ph is compensated.    Had to be placed back on BiPAP 10/5 to assist with adequate ventilation -CXR showed "No significant interval change as compared to chest radiograph 01/20/2019.  Persistent ill-defined opacities within the mid to basilar lungs bilaterally. Findings may reflect edema, atelectasis and/or pneumonia. Persistent left greater than right pleural effusions. As before, the left pleural effusion may be partially loculated. Cardiomegaly." -Obtaining a Pulm Consult but likely first will obtain a CT of the Chest to further delineate -CT Chest w/o Contrast showed "Trace left-sided pleural effusion. Small  right-sided pleural effusion. Partial consolidations at the right greater than left lung bases, could reflect atelectasis or pneumonia. 2. Advanced emphysema. 3. Stable scattered pulmonary nodules in the right middle  and upper lobes measuring up to 5 mm, likely benign given lack of interval change. 4. 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 5. Trace pericardial effusion 6. Stable to slight decreased mediastinal adenopathy."  -She had an ABG done which showed a pH of 7.315, PCO2 of 69.6, PO2 of 85.0, bicarbonate level 35.5, and an O2 saturation of 95% -On p.o. doxycycline and will continue -IV Solu-Medrol dosing was increased from 40 every 6h to 60 mg every 8h will continue today -We will discontinue inhalers and continue with nebulizers breathing meds.  Held [employer] and [employer] Ellipta and started the patient on Brovana and budesonide and will continue DuoNeb 3 mL every 6 scheduled   -Continue p.o. acetazolamide to 50 mg p.o. twice daily as well as p.o furosemide and will give another dose of IV Lasix 40 mg -Decrease her Xanax from 1 mg to 0.25 mg 3 times daily as needed, -Discontinue Benadryl at bedtime. -Continue with guaifenesin 600 mg p.o. twice daily -Continue with albuterol inhaler 2 puffs elations every 6 as needed for wheezing or shortness of breath -SARS-CoV-2 Testing Negative (POC and PCR)  -We will need an ambulatory home O2 screen prior to discharge if able -Repeat chest x-ray in the a.m.  -Continue with incentive spirometry, and flutter valve -Pulmonary consulted and they checked a respiratory virus panel which was negative and now they are checking acetylcholine receptor antibodies to look for any further reversible causes.  Dr. [name] feels that this may be a terminal decline and that this hospitalization may be a terminal event; May allow to eat if she tolerates it but could not come off the BiPAP long enough as she had severe air hunger and dyspnea -Consulted palliative care for goals of care discussion and further evaluation  Poorly Controlled Diabetes Mellitus Type 2 -We will reduce home Lantus dosing from 25 units to 20 units -Continue with moderate  NovoLog/scale insulin AC -Sugars are likely to elevate in the setting of steroid demargination and have been ranging from 208-259 -Recent hemoglobin A1c done on 11/02/2018 was 9.4  -Continue monitor blood sugars carefully and adjust insulin as necessary -Diabetes education coordinator has been consulted for further evaluation recommendations  Chronic Microcytic Anemia -Patient's Hb/Hct went from 11.9/35.0 -> 8.6/34.8 -> 8.6/34.3 -Check anemia panel showed an iron level of 15, U IBC of 427, TIBC 442, saturation ratios of 3%, ferritin level 7, and vitamin B12 level 311 -We will start the patient on iron polysaccharides 150 mg p.o. daily -Check FOBT -Continue to monitor for signs and symptoms of bleeding; currently no overt bleeding noted -Repeat CBC in a.m.  Acute on Chronic Diastolic CHF -Symptoms initially felt to be mainly due to COPD exacerbation and her x-ray looks no significant changes compared to that in October this year examination she appears a little volume overloaded and she does have cardiomegaly -BNP was 106.9 -Given another dose of IV Lasix in addition to her home p.o. Lasix again today  -She is -2.126 Liters since Admission -Strict I's and O's and daily weights; may need to fluid restrict  -Continue monitor for signs and symptoms of volume overload and repeat a chest x-ray in the a.m.  Pulmonary nodules, bilateral lower and mediastinal lymphadenosis/mass -Uunknown etiology, according to Pulm/Critical Care note in October; he is Dr. Fuller Plan -Had  had PET scan in 2017 showed malignant features, and patient could not tolerate bronchoscopy. -Normally maintained on Trelegy Ellipta and 20 mg of p.o. prednisone daily -Will get Pulmonary Involved and obtain a CT Chest w/o Contrast  -See Above   Hypernatremia -Improved. Na+ went from 147 -> 142 -? Hypervolemic Hypernatremia -Given an additional dose of IV Lasix again this AM  -Continue to Monitor and Trend -Repeat  CMP in AM   Hypokalemia -Improved as K+ is now 3.8 -Continue to Monitor and Replete as Necessary -Repeat CMP in AM   Deconditioning -Patient under palliative care on last admission, her COPD looks very advanced, - Gold stage IV, FEV1/FVC 47% in August 2020.   -Will need close pulmonary follow-up and may decide to consult this Admission -Will need PT/OT evaluation and they are recommending skilled nursing facility -Get SLP evaluation as well given her difficulty with swallowing pills yesterday due to her respiratory status -Unable to come off of BiPAP   Abnormal LFT's -Patient's AST was 56 and ALT was 50; Now AST is 29 and ALT is 56 -Continue to Monitor and Trend and if remain elevated or not improving will obtain RUQ U/S and Acute Hepatitis Panel  -Repeat CMP in the AM   Leukocytosis; slightly worsened  -Improved initially WBC went from 11.4 -> 10.0 but now worsened in the setting of IV Steroid Demargination and is  12.3 -Continue to Monitor and Trend -Repeat CBC in AM   Anxiety/generalized anxiety disorder -Continue with anxiolytics at the reduced dose  Obesity -Estimated body mass index is 37.31 kg/m as calculated from the following:   Height as of 09/05/18: _0  (1.6 m).   Weight as of 11/06/18: 95.5 kg. -Weight Loss and Dietary Counseling ginve  DVT prophylaxis: Enoxaparin 40 mg sq q24h Code Status: Partial Code but was documented as a FULL CODE 7 days ago so will need to Clarify; she does not want to be intubated Family Communication: No family present at bedside  Disposition Plan: Pending further clinical improvement.  Consultants:   PCCM/pulmonary  Palliative Care   Procedures: None   Antimicrobials:  Anti-infectives (From admission, onward)   Start     Dose/Rate Route Frequency Ordered Stop   01/20/19 1330  doxycycline (VIBRA-TABS) tablet 100 mg     100 mg Oral Every 12 hours 01/20/19 1325 01/25/19 0959     Subjective: Seen and examined this morning  states that her shortness of breath is slightly improved but still continues to be on BiPAP and when she tried to be weaned off into heated high flow nasal cannula and was unable and had some air hunger and dyspnea.  Transitioned back to BiPAP.  I spoke with pulmonary and they feel this may be a terminal hospitalization given her poor lung function recommending letting the patient eat if tolerated and a palliative care consultation with goals of care discussion.  Objective: Vitals:   01/22/19 0942 01/22/19 1031 01/22/19 1129 01/22/19 1226  BP:   95/60   Pulse: (!) 103 84 80 82  Resp: (!) _1 Temp:   98.4 F (36.9 C)   TempSrc:   Oral   SpO2: (!) 89% 90% 92% 94%    Intake/Output Summary (Last 24 hours) at 01/22/2019 1547 Last data filed at 01/22/2019 1300 Gross per 24 hour  Intake 286.1 ml  Output 2675 ml  Net -2388.9 ml   There were no vitals filed for this visit.  Examination: Physical Exam:  Constitutional: WN/WD  obese female currently in some respiratory distress remains on the BiPAP and is a little bit anxious Eyes: Lids and conjunctivae normal, sclerae anicteric  ENMT: External Ears, Nose appear normal. Grossly normal hearing. Mucous membranes are moist.  Neck: Appears normal, supple, no cervical masses, normal ROM, no appreciable thyromegaly; call to assess JVD given her body habitus Respiratory: Diminished to auscultation bilaterally with coarse breath sounds and some crackles and mild expiratory wheezing; has a increased respiratory rate and is wearing the BiPAP Cardiovascular: RRR, 2/6 systolic murmur S1 and S2 auscultated.  Continues to have 1+ extremity edema.   Abdomen: Soft, non-tender, distended secondary body habitus. Bowel sounds positive x4.  GU: Deferred. Musculoskeletal: No clubbing / cyanosis of digits/nails. No joint deformity upper and lower extremities.  Skin: No rashes, lesions, ulcers on a limited skin evaluation. No induration; Warm and dry.    Neurologic: CN 2-12 grossly intact with no focal deficits. Romberg sign and cerebellar reflexes not assessed.  Psychiatric: Normal judgment and insight. Alert and oriented x 3.  Mildly anxious mood and appropriate affect.   Data Reviewed: I have personally reviewed following labs and imaging studies  CBC: Recent Labs  Lab 01/20/19 1024 01/20/19 1115 01/21/19 0306 01/22/19 0302  WBC 11.4*  --  10.0 12.3*  NEUTROABS 8.2*  --   --  11.0*  HGB 9.0* 11.9* 8.6* 8.6*  HCT 38.4 35.0* 34.8* 34.3*  MCV 78.9*  --  76.5* 74.9*  PLT 260  --  243 283   Basic Metabolic Panel: Recent Labs  Lab 01/20/19 1024 01/20/19 1115 01/21/19 0306 01/22/19 0302  NA 147* 147* 145 142  K 3.4* 3.4* 4.8 3.8  CL 105  --  107 102  CO2 31  --  30 30  GLUCOSE 268*  --  264* 243*  BUN 12  --  13 17  CREATININE 0.78  --  0.95 0.86  CALCIUM 9.0  --  8.8* 8.9  MG  --   --   --  2.5*  PHOS  --   --   --  3.0   GFR: CrCl cannot be calculated (Unknown ideal weight.). Liver Function Tests: Recent Labs  Lab 01/20/19 1024 01/22/19 0302  AST 56* 29  ALT 50* 56*  ALKPHOS 60 54  BILITOT 0.3 0.3  PROT 5.7* 5.8*  ALBUMIN 3.1* 3.1*   No results for input(s): LIPASE, AMYLASE in the last 168 hours. No results for input(s): AMMONIA in the last 168 hours. Coagulation Profile: No results for input(s): INR, PROTIME in the last 168 hours. Cardiac Enzymes: No results for input(s): CKTOTAL, CKMB, CKMBINDEX, TROPONINI in the last 168 hours. BNP (last 3 results) No results for input(s): PROBNP in the last 8760 hours. HbA1C: No results for input(s): HGBA1C in the last 72 hours. CBG: Recent Labs  Lab 01/21/19 1233 01/21/19 1622 01/21/19 2148 01/22/19 0626 01/22/19 1146  GLUCAP 235* 259* 223* 208* 230*   Lipid Profile: No results for input(s): CHOL, HDL, LDLCALC, TRIG, CHOLHDL, LDLDIRECT in the last 72 hours. Thyroid Function Tests: No results for input(s): TSH, T4TOTAL, FREET4, T3FREE, THYROIDAB in the  last 72 hours. Anemia Panel: Recent Labs    01/20/19 1350  VITAMINB12 311  FERRITIN 7*  TIBC 442  IRON 15*  RETICCTPCT 2.0   Sepsis Labs: No results for input(s): PROCALCITON, LATICACIDVEN in the last 168 hours.  Recent Results (from the past 240 hour(s))  SARS CORONAVIRUS 2 (TAT 6-24 HRS) Nasopharyngeal Nasopharyngeal Swab     Status:  None   Collection Time: 01/20/19  1:33 PM   Specimen: Nasopharyngeal Swab  Result Value Ref Range Status   SARS Coronavirus 2 NEGATIVE NEGATIVE Final    Comment: (NOTE) SARS-CoV-2 target nucleic acids are NOT DETECTED. The SARS-CoV-2 RNA is generally detectable in upper and lower respiratory specimens during the acute phase of infection. Negative results do not preclude SARS-CoV-2 infection, do not rule out co-infections with other pathogens, and should not be used as the sole basis for treatment or other patient management decisions. Negative results must be combined with clinical observations, patient history, and epidemiological information. The expected result is Negative. Fact Sheet for Patients: SugarRoll.be Fact Sheet for Healthcare Providers: https://www.woods-mathews.com/ This test is not yet approved or cleared by the [place] FDA and  has been authorized for detection and/or diagnosis of SARS-CoV-2 by FDA under an Emergency Use Authorization (EUA). This EUA will remain  in effect (meaning this test can be used) for the duration of the COVID-19 declaration under Section 56 4(b)(1) of the Act, 21 U.S.C. section 360bbb-3(b)(1), unless the authorization is terminated or revoked sooner. Performed at [facility], [place] [address] [place], [place] [address]   Respiratory Panel by PCR     Status: None   Collection Time: 01/21/19 11:43 PM   Specimen: Nasopharyngeal Swab; Respiratory  Result Value Ref Range Status   Adenovirus NOT DETECTED NOT DETECTED Final   Coronavirus 229E NOT  DETECTED NOT DETECTED Final    Comment: (NOTE) The Coronavirus on the Respiratory Panel, DOES NOT test for the novel  Coronavirus (2019 nCoV)    Coronavirus HKU1 NOT DETECTED NOT DETECTED Final   Coronavirus NL63 NOT DETECTED NOT DETECTED Final   Coronavirus OC43 NOT DETECTED NOT DETECTED Final   Metapneumovirus NOT DETECTED NOT DETECTED Final   Rhinovirus / Enterovirus NOT DETECTED NOT DETECTED Final   Influenza A NOT DETECTED NOT DETECTED Final   Influenza B NOT DETECTED NOT DETECTED Final   Parainfluenza Virus 1 NOT DETECTED NOT DETECTED Final   Parainfluenza Virus 2 NOT DETECTED NOT DETECTED Final   Parainfluenza Virus 3 NOT DETECTED NOT DETECTED Final   Parainfluenza Virus 4 NOT DETECTED NOT DETECTED Final   Respiratory Syncytial Virus NOT DETECTED NOT DETECTED Final   Bordetella pertussis NOT DETECTED NOT DETECTED Final   Chlamydophila pneumoniae NOT DETECTED NOT DETECTED Final   Mycoplasma pneumoniae NOT DETECTED NOT DETECTED Final    Comment: Performed at [facility], [place]. [address] [place], [place] [address]     RN Pressure Injury Documentation: Pressure Injury 08/30/18 Buttocks Right;Medial Stage II -  Partial thickness loss of dermis presenting as a shallow open ulcer with a red, pink wound bed without slough. (Active)  08/30/18 2134  Location: Buttocks  Location Orientation: Right;Medial  Staging: Stage II -  Partial thickness loss of dermis presenting as a shallow open ulcer with a red, pink wound bed without slough.  Wound Description (Comments):   Present on Admission:    Radiology Studies: CT CHEST WO CONTRAST  Result Date: 01/21/2019 CLINICAL DATA:  COPD exacerbation EXAM: CT CHEST WITHOUT CONTRAST TECHNIQUE: Multidetector CT imaging of the chest was performed following the standard protocol without IV contrast. COMPARISON:  Chest x-ray 01/21/2019, CT chest 10/29/2016, 03/21/2015 FINDINGS: Cardiovascular: Limited evaluation without intravenous contrast.  Moderate aortic atherosclerosis. No aneurysmal dilatation. Coronary vascular calcification. 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 Borderline to mild cardiomegaly. Trace pericardial effusion. Mediastinum/Nodes: Trachea is midline.  Possible subcentimeter left thyroid nodule without change. No significant change in mediastinal adenopathy. Index prevascular lymph node measures 11 mm compared with 13 mm previously. Index right paratracheal node measures 12 mm compared with 13.5 mm previously. Probable hilar adenopathy though difficult to measure without intravenous contrast. Esophagus is unremarkable. Lungs/Pleura: Trace left-sided pleural effusion. Small right-sided pleural effusion. Partial consolidations at the right greater than left lung bases. Advanced emphysema. Stable scattered pulmonary nodules in the right middle and upper lobes measuring up to 5 mm. Negative for pneumothorax. Upper Abdomen: No acute abnormality Musculoskeletal: No acute or suspicious lesion IMPRESSION: 1. Trace left-sided pleural effusion. Small right-sided pleural effusion. Partial consolidations at the right greater than left lung bases, could reflect atelectasis or pneumonia. 2. Advanced emphysema. 3. Stable scattered pulmonary nodules in the right middle and upper lobes measuring up to 5 mm, likely benign given lack of interval change. 4. 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 5. Trace pericardial effusion 6. Stable to slight decreased mediastinal adenopathy Aortic Atherosclerosis (ICD10-I70.0) and Emphysema (ICD10-J43.9). Aortic Atherosclerosis (ICD10-I70.0) and Emphysema (ICD10-J43.9). Electronically Signed   By: Donavan Foil M.D.   On: 01/21/2019 22:48   DG CHEST PORT 1 VIEW  Result Date: 01/22/2019 CLINICAL DATA:  Shortness of breath. EXAM: PORTABLE CHEST 1 VIEW COMPARISON:  One day prior FINDINGS: Midline trachea. Mild cardiomegaly. Left-sided  aortic arch. Small left and trace right pleural effusions, similar. No pneumothorax. Pulmonary artery enlargement. Bibasilar airspace disease, similar. IMPRESSION: No significant change since 1 day prior. Left larger than right pleural effusions with bibasilar airspace disease. Cardiomegaly without congestive failure. Left-sided aortic arch. Pulmonary artery enlargement suggests pulmonary arterial hypertension. Electronically Signed   By: Abigail Miyamoto M.D.   On: 01/22/2019 09:50   DG CHEST PORT 1 VIEW  Result Date: 01/21/2019 CLINICAL DATA:  Shortness of breath. EXAM: PORTABLE CHEST 1 VIEW COMPARISON:  Chest radiograph 01/20/2019 FINDINGS: Unchanged cardiomegaly. Redemonstrated bilateral interstitial prominence. Similar appearance of ill-defined opacity within the mid to basilar lungs bilaterally. Unchanged small to moderate left with small right pleural effusions. As before, the left pleural effusion may be partially loculated. No evidence of pneumothorax. No acute bony abnormality. Overlying cardiac monitoring leads. IMPRESSION: No significant interval change as compared to chest radiograph 01/20/2019. Persistent ill-defined opacities within the mid to basilar lungs bilaterally. Findings may reflect edema, atelectasis and/or pneumonia. Persistent left greater than right pleural effusions. As before, the left pleural effusion may be partially loculated. Cardiomegaly. Electronically Signed   By: Kellie Simmering DO   On: 01/21/2019 10:32   Scheduled Meds:  acetaZOLAMIDE  250 mg Oral BID   arformoterol  15 mcg Nebulization BID   budesonide (PULMICORT) nebulizer solution  0.25 mg Nebulization BID   chlorhexidine gluconate (MEDLINE KIT)  15 mL Mouth Rinse BID   doxycycline  100 mg Oral Q12H   enoxaparin (LOVENOX) injection  40 mg Subcutaneous Q24H   fluticasone  1 spray Each Nare Daily   furosemide  40 mg Oral Daily   guaiFENesin  600 mg Oral BID   insulin aspart  0-15 Units Subcutaneous TID WC    insulin glargine  20 Units Subcutaneous QHS   ipratropium-albuterol  3 mL Nebulization Q6H   iron polysaccharides  150 mg Oral Daily   methylPREDNISolone (SOLU-MEDROL) injection  60 mg Intravenous Q8H   pantoprazole  40 mg Oral Daily   PARoxetine  40 mg Oral q morning - 10a   umeclidinium bromide  1 puff Inhalation Daily   Continuous Infusions:  sodium chloride 500 mL (01/21/19 2220)    LOS: 2 days   Kerney Elbe, DO [facility] PAGER is on [place]  If 7PM-7AM, please contact night-coverage [email or web address]

## 2019-01-22 NOTE — Progress Notes (Signed)
Alert and oriented x 4. Vital stable, afebrile, SPO2 95-99% with BiPap. Pt went to CT chest tonight with RN, RT and transported staff. No incident of immediate distress tonight  Got order from on-call provider, Jeannette Corpus, NP to collect urine specimen by clean in and out straight urine cath due to Pt has urine and bowel incontinent. In and out cath performed by Michela Pitcher, NT with Tie RN supervised. Urine specimen sent. No BM tonight. Awaiting for stool specimen for occult blood.  Respiratory PCR screening result negative. We discontinue her droplet precaution.  Pt requested to eat, she took mask BiPap off and took med with sips of water, her SPO2 dropped to 70s%.  Day shift RN, Katharine Look reported Pt tried to eat her dinner, using 8 LPM of O2 HFNCL, her SPO2 dropped to 70s% and she got more SOB, exertion at rest and got more anxious, so RN had to put BiPaP back on and kept her NPO.   We will determine her ability to eat and breath with HFNCL on day time again.  Kennyth Lose, RN

## 2019-01-22 NOTE — Progress Notes (Signed)
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  Well-known to Dr. [name] in the outpatient clinic but last seen by him in October 2019.  At that point in time in October 2019 she was on home palliative care with chronic prednisone 20 mg/day and Trelegy inhaler and 5 L of oxygen with continued ongoing smoking.  She was dependent on her nephew for her groceries.  She had a history of recurrent exacerbations.  Her baseline CAT score was 27 and in 2018 she did not have hypercapnia and FEV1 was around 53%.  She had televisit on mid December 2020 with increased shortness of breath and complains that her pulse ox was dropping into the 70s even with minimal exertion and she was completely homebound because of this.  Xanax was helping.  [Normally she saturates in the 80s].  Increase in oxygen to nearly 7 L was recommended and a prednisone taper.  Along with doxycycline.  She had a follow-up televisit on January 13, 2019.  It was felt that she was improving.  She then got admitted on January 20, 2019 with worsening shortness of breath with ABG showing hypoxemia and hypercapnia and started on BiPAP, steroids systemic and bronchodilator.  She is been unable to come off BiPAP.  She insist on coming off BiPAP but she desaturates even though she is feeling okay.  She has class IV dyspnea.  Nurse is extremely distressed at the level of dyspnea coming off the BiPAP.  She is asking to eat.  Given the complete dependency on BiPAP and class IV  dyspnea with severe hypoxemia pulmonary has been consulted      Significant Hospital Events   01/20/2019 - admit  Consults:  01/21/2019 - pulm  Procedures:  bipap  Significant Diagnostic Tests:  x  Micro Data:  x  Antimicrobials:  x   Interim history/subjective:  x  Objective   Blood pressure (!) 96/59, pulse (!) 103, temperature 98.9 F (37.2 C), temperature source Axillary, resp. rate (!) 27, SpO2 (!) 89 %.    FiO2 (%):  [40 %-50 %] 50 %   Intake/Output Summary (Last 24 hours) at 01/22/2019 1011 Last data filed at 01/22/2019 0900 Gross per 24 hour  Intake 286.1 ml  Output 1975 ml  Net -1688.9 ml   There were no vitals filed for this visit.  Examination: General: Morbidly obese female currently on BiPAP HEENT: Short neck Neuro: Arouses follows command reports anxiety CV: Heart sounds are distant PULM: Decreased air movement Vent noninvasive mechanical ventilatory support FIO2 50% decrease to 40% PEEP 8 RATE 16 VT 420  GI: soft, bsx4 active  GU: Extremities: warm/dry,  edema  Skin: no rashes or lesions   cxr 01-22-19  Resolved Hospital Problem list   X  Assessment & Plan:  Acute on chronic hypoxemic and hypercapnic respiratory failure -at baseline both stage IV COPD with chronic prednisone 5 L hypoxemia.  Seems to have new onset of left-sided density possibly pleural effusion   Plan 01/22/2019 unable to tolerate being off BiPAP  more than 3 to 4 minutes.  Note she got Xanax during the night for anxiety. Decreased FiO2 on BiPAP 40% Does not appear that she can come off BiPAP at this time Note diuresis Respiratory virus panel was negative Most likely she will need to transition to full comfort care.     Best practice:  Diet: N.p.o. but for palliative reasons okay to eat especially because she is DNR and if she insists on eating Pain/Anxiety/Delirium protocol (if indicated): X VAP protocol (if indicated): Head of bed greater than 30 degrees DVT  prophylaxis: According to the hospitalist GI prophylaxis: According to the hospitalist Glucose control: Needs sliding scale insulin but managed by the hospitalist Mobility: Bedrest Code Status: Partial code  Family Communication: 01/22/2019 spoke with patient concerning attempt to come off BiPAP.  She was agreeable to this but promptly failed desatted and request to go back on BiPAP. Disposition: Remain in 4 E.    LABS    PULMONARY Recent Labs  Lab 01/20/19 1115  HCO3 35.5*  TCO2 38*  O2SAT 95.0    CBC Recent Labs  Lab 01/20/19 1024 01/20/19 1115 01/21/19 0306 01/22/19 0302  HGB 9.0* 11.9* 8.6* 8.6*  HCT 38.4 35.0* 34.8* 34.3*  WBC 11.4*  --  10.0 12.3*  PLT 260  --  243 250    COAGULATION No results for input(s): INR in the last 168 hours.  CARDIAC  No results for input(s): TROPONINI in the last 168 hours. No results for input(s): PROBNP in the last 168 hours.   CHEMISTRY Recent Labs  Lab 01/20/19 1024 01/20/19 1115 01/21/19 0306 01/22/19 0302  NA 147* 147* 145 142  K 3.4* 3.4* 4.8 3.8  CL 105  --  107 102  CO2 31  --  30 30  GLUCOSE 268*  --  264* 243*  BUN 12  --  13 17  CREATININE 0.78  --  0.95 0.86  CALCIUM 9.0  --  8.8* 8.9  MG  --   --   --  2.5*  PHOS  --   --   --  3.0   CrCl cannot be calculated (Unknown ideal weight.).   LIVER Recent Labs  Lab 01/20/19 1024 01/22/19 0302  AST 56* 29  ALT 50* 56*  ALKPHOS 60 54  BILITOT 0.3 0.3  PROT 5.7* 5.8*  ALBUMIN 3.1* 3.1*     INFECTIOUS No results for input(s): LATICACIDVEN, PROCALCITON in the last 168 hours.   ENDOCRINE CBG (last 3)  Recent Labs    01/21/19 1622 01/21/19 2148 01/22/19 0626  GLUCAP 259* 223* 208Richardson [name] [name] ACNP Acute Care Nurse Practitioner [place] Please consult Amion 01/22/2019, 10:12 AM

## 2019-01-22 NOTE — Progress Notes (Signed)
Attempted trial with HFNC @ 15L.   Patient c/o increasing SOB and unable to tolerate more than 2 mins.  Unable to provide mouth care or take medications

## 2019-01-23 ENCOUNTER — Inpatient Hospital Stay (HOSPITAL_COMMUNITY): Payer: Medicare Other

## 2019-01-23 LAB — CBC WITH DIFFERENTIAL/PLATELET
Abs Immature Granulocytes: 0.05 10*3/uL (ref 0.00–0.07)
Basophils Absolute: 0 10*3/uL (ref 0.0–0.1)
Basophils Relative: 0 %
Eosinophils Absolute: 0 10*3/uL (ref 0.0–0.5)
Eosinophils Relative: 0 %
HCT: 33.7 % — ABNORMAL LOW (ref 36.0–46.0)
Hemoglobin: 8.5 g/dL — ABNORMAL LOW (ref 12.0–15.0)
Immature Granulocytes: 1 %
Lymphocytes Relative: 4 %
Lymphs Abs: 0.4 10*3/uL — ABNORMAL LOW (ref 0.7–4.0)
MCH: 18.6 pg — ABNORMAL LOW (ref 26.0–34.0)
MCHC: 25.2 g/dL — ABNORMAL LOW (ref 30.0–36.0)
MCV: 73.6 fL — ABNORMAL LOW (ref 80.0–100.0)
Monocytes Absolute: 0.4 10*3/uL (ref 0.1–1.0)
Monocytes Relative: 4 %
Neutro Abs: 9.4 10*3/uL — ABNORMAL HIGH (ref 1.7–7.7)
Neutrophils Relative %: 91 %
Platelets: 247 10*3/uL (ref 150–400)
RBC: 4.58 MIL/uL (ref 3.87–5.11)
RDW: 19.9 % — ABNORMAL HIGH (ref 11.5–15.5)
WBC: 10.2 10*3/uL (ref 4.0–10.5)
nRBC: 1.2 % — ABNORMAL HIGH (ref 0.0–0.2)

## 2019-01-23 LAB — COMPREHENSIVE METABOLIC PANEL
ALT: 44 U/L (ref 0–44)
AST: 16 U/L (ref 15–41)
Albumin: 3.2 g/dL — ABNORMAL LOW (ref 3.5–5.0)
Alkaline Phosphatase: 52 U/L (ref 38–126)
Anion gap: 11 (ref 5–15)
BUN: 22 mg/dL (ref 8–23)
CO2: 27 mmol/L (ref 22–32)
Calcium: 9 mg/dL (ref 8.9–10.3)
Chloride: 103 mmol/L (ref 98–111)
Creatinine, Ser: 0.84 mg/dL (ref 0.44–1.00)
GFR calc Af Amer: >60 mL/min (ref >60)
GFR calc non Af Amer: >60 mL/min (ref >60)
Glucose, Bld: 200 mg/dL — ABNORMAL HIGH (ref 70–99)
Potassium: 3.8 mmol/L (ref 3.5–5.1)
Sodium: 141 mmol/L (ref 135–145)
Total Bilirubin: 0.6 mg/dL (ref 0.3–1.2)
Total Protein: 5.7 g/dL — ABNORMAL LOW (ref 6.5–8.1)

## 2019-01-23 LAB — PHOSPHORUS: Phosphorus: 2.9 mg/dL (ref 2.5–4.6)

## 2019-01-23 LAB — GLUCOSE, CAPILLARY
Glucose-Capillary: 215 mg/dL — ABNORMAL HIGH (ref 70–99)
Glucose-Capillary: 233 mg/dL — ABNORMAL HIGH (ref 70–99)
Glucose-Capillary: 166 mg/dL — ABNORMAL HIGH (ref 70–99)
Glucose-Capillary: 380 mg/dL — ABNORMAL HIGH (ref 70–99)

## 2019-01-23 LAB — MAGNESIUM: Magnesium: 2.4 mg/dL (ref 1.7–2.4)

## 2019-01-23 MED ORDER — METHYLPREDNISOLONE SODIUM SUCC 125 MG IJ SOLR
60.0000 mg | Freq: Two times a day (BID) | INTRAMUSCULAR | Status: DC
  Administered 2019-01-23 – 2019-01-27 (×8): 60 mg via INTRAVENOUS
  Filled 2019-01-23 (×8): qty 2

## 2019-01-23 NOTE — Progress Notes (Signed)
Pt taken off bipap at 0845 and placed on 10 L HFNC.  Pt's sats remained in the mid-90's.  She was able to take meds, eat breakfast and receive breathing treatments with sats remaining in the mid-90's.  Pt's breathing was nonlabored and pt was comfortable.  Pt sat up in bed at 12p and desatted to the upper 70's low 80s.  HFNC up to 15L, pt resting in bed with sats returning to low to mid-90's.  However, pt's breathing remained labored, she expressed feeling unable to get her breath and requested bipap mask be replaced.  Bipap was replaced at approximately 12:15 p. RT made aware.  Pt resting comfortably.  Will continue to monitor.

## 2019-01-23 NOTE — Progress Notes (Signed)
PROGRESS NOTE    [name]  DXI:[id number] DOB: [birth year] DOA: 01/20/2019 PCP: Binnie Rail, MD  Brief Narrative:  HPI per Dr. [name] on 01/20/2019 [name] is a 74 y.o. female with medical history significant of COPD baseline 6 L of oxygen at all times, bronchiectasis, diastolic CHF, HLD, HTN, mediastinal mass, CT, pulmonary nodules, Tobacco abuse psoriasis, GERD, diabetes, presented with increasing short of breath since yesterday, with worsening of wheezing and occasional dry cough no fevers no chills no chest pain no leg edema she denied any Covid contact.  Denied any muscle ache night sweat, dysuria diarrhea.  ED Course: ABG showed mixed hypoxia and hypercapnia, received Solu-Medrol and bronchodilator.  **Interim History  Patient continued to remain on the BiPAP this morning and was transitioned to high flow nasal cannula they day before yesterday however had to go back on the BiPAP after a little while.  She was placed on 6 to 8 L. She was given additional dose of IV Lasix and obtained a CT of the chest without contrast and a pulmonary consult.  01/21/2018: CT of the chest showed a trace left-sided pleural effusion with a small right-sided pleural effusion with partial consolidations at the right greater than left lung bases which could reflect atelectasis or pneumonia.  Patient does have advanced emphysema and stable scattered pulmonary nodules in the right middle and upper lobes measuring up to 5 mm.  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  I spoke with pulmonary and her respiratory virus panel was negative and they feel that her lung function is further compromised with the presence of the pleural effusion and Dr. [name] believes that she may be a terminal decline.  He recommends continue the BiPAP for now and recommends okay to give concomitant  opioids and anxiolytics for symptom relief and eat if tolerated.  They are checking in a acetylcholine receptor antibody to rule out reversible causes and he recommended obtaining palliative care conversation having goals of care discussion with the family since he believes that this hospitalization may be a terminal event.  01/22/2018: She is breathing much better today and has been weaned off of BiPAP and then transition to high flow nasal cannula and is on 10 L with saturations in the mid 90s.  Is able to tolerate her food off of BiPAP has been off of it for a while and not feeling as dyspneic. Repeat CXR showed "Stable cardiomegaly. No pneumothorax is noted. Stable bilateral lung opacities are noted, right greater than left, concerning for pneumonia. Small pleural effusions may be present. Bony thorax is unremarkable."  Assessment & Plan:   Active Problems:   COPD (chronic obstructive pulmonary disease) (HCC)   Chronic respiratory failure (HCC)   COPD exacerbation (HCC)  Acute on Chronic Hypoxic and Hypercapnic respiratory failure, due to COPD exacerbation and bronchiectasis possible concomitant acute on chronic diastolic CHF exacerbation, improved slightly  -Admitted to progressive unit inpatient given her use of BiPAP; had to be placed on a nonrebreather and she desaturated down to 70% when it was taken off; she has been weaned off of it and is on 10 L of high flow nasal cannula -At baseline patient wears 6 L of nasal cannula saturations in the low to mid 90s -Titrate oxygen sat O2 saturation 91 to 93%.   -My colleague Discussed with ED physician regarding using BiPAP,  confirmation Covid test  sent, if negative patient will be placed on BiPAP.   -May need Trilogy to go home, hypercapnia seems like a chronic problem, as her Ph is compensated.    Had to be placed back on BiPAP 10/5 to assist with adequate ventilation -CXR today showed "Stable cardiomegaly. No pneumothorax is noted. Stable  bilateral lung opacities are noted, right greater than left, concerning for pneumonia. Small pleural effusions may be present. Bony thorax is unremarkable" -Obtaining a Pulm Consult but likely first will obtain a CT of the Chest to further delineate -CT Chest w/o Contrast showed "Trace left-sided pleural effusion. Small right-sided pleural effusion. Partial consolidations at the right greater than left lung bases, could reflect atelectasis or pneumonia. 2. Advanced emphysema. 3. Stable scattered pulmonary nodules in the right middle and upper lobes measuring up to 5 mm, likely benign given lack of interval change. 4. 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 5. Trace pericardial effusion 6. Stable to slight decreased mediastinal adenopathy."  -She had an ABG done which showed a pH of 7.315, PCO2 of 69.6, PO2 of 85.0, bicarbonate level 35.5, and an O2 saturation of 95% -On p.o. doxycycline and will continue for now  -IV Solu-Medrol dosing was increased from 40 every 6h to 60 mg every 8h will continue today and de-escalate to every 12h today  -We will discontinue inhalers and continue with nebulizers breathing meds.  Held [place] and Incruse Ellipta and started the patient on Brovana and budesonide and will continue DuoNeb 3 mL every 6 scheduled   -Continue p.o. Acetazolamide to 50 mg p.o. Twice daily as well as p.o furosemide; Given a another dose of IV Lasix 40 mg -Decreased her Xanax from 1 mg to 0.25 mg 3 times daily as needed, -Discontinue Benadryl at bedtime. -Continue with Guaifenesin 600 mg p.o. twice daily -Continue with albuterol inhaler 2 puffs elations every 6 as needed for wheezing or shortness of breath -SARS-CoV-2 Testing Negative (POC and PCR)  -We will need an ambulatory home O2 screen prior to discharge if able -Repeat chest x-ray in the a.m.  -Continue with incentive spirometry, and flutter valve -Pulmonary consulted and they checked a respiratory virus panel  which was negative and now they are checking acetylcholine receptor antibodies to look for any further reversible causes.  Dr. [name] feels that this may be a terminal decline and that this hospitalization may be a terminal event; May allow to eat if she tolerates it but could not come off the BiPAP long enough as she had severe air hunger and dyspnea -Consulted palliative care for goals of care discussion and further evaluation and they are still to see the patient -Patient's respiratory status improved somewhat from yesterday and is now off BiPAP and tolerating 10 L high flow nasal cannula and able to eat today -Appreciate further care and further pulmonary recommendations  Poorly Controlled Diabetes Mellitus Type 2 -We will reduce home Lantus dosing from 25 units to 20 units -Continue with moderate NovoLog/scale insulin AC -Sugars are likely to elevate in the setting of steroid demargination and have been ranging from 212-243 -Recent hemoglobin A1c done on 11/02/2018 was 9.4  -Continue monitor blood sugars carefully and adjust insulin as necessary -Diabetes education coordinator has been consulted for further evaluation recommendations  Chronic Microcytic Anemia -Patient's Hb/Hct went from 11.9/35.0 -> 8.6/34.8 -> 8.6/34.3 -> 8.5/33.7 -Check Anemia Panel showed an iron level of 15, U IBC of 427, TIBC 442, saturation ratios of 3%, ferritin level 7, and vitamin B12  level 311 -We will start the patient on iron polysaccharides 150 mg p.o. daily -Check FOBT -Continue to monitor for signs and symptoms of bleeding; currently no overt bleeding noted -Repeat CBC in a.m.  Acute on Chronic Diastolic CHF -Symptoms initially felt to be mainly due to COPD exacerbation and her x-ray looks no significant changes compared to that in October this year examination she appears a little volume overloaded and she does have cardiomegaly -BNP was 106.9 -Given another dose of IV Lasix yesterday in addition  to her home p.o. Lasix; Will just continue po Lasix today  -She is -3.276 Liters since Admission -Strict I's and O's and daily weights; may need to fluid restrict  -Continue monitor for signs and symptoms of volume overload and repeat a chest x-ray in the a.m.  Pulmonary nodules, bilateral lower and mediastinal lymphadenosis/mass -Uunknown etiology, according to Pulm/Critical Care note in October; he is Dr. Fuller Plan -Had had PET scan in 2017 showed malignant features, and patient could not tolerate bronchoscopy. -Normally maintained on Trelegy Ellipta and 20 mg of p.o. prednisone daily -Will get Pulmonary Involved and obtain a CT Chest w/o Contrast  -See Above   Hypernatremia -Improved. Na+ went from 147 -> 142 -> 141 -? Hypervolemic Hypernatremia -Given an additional dose of IV Lasix again this AM  -Continue to Monitor and Trend -Repeat CMP in AM   Hypokalemia -Improved as K+ is now 3.8 and stable  -Continue to Monitor and Replete as Necessary -Repeat CMP in AM   Deconditioning -Patient under palliative care on last admission, her COPD looks very advanced, - Gold stage IV, FEV1/FVC 47% in August 2020.   -Will need close pulmonary follow-up and may decide to consult this Admission -Will need PT/OT evaluation and they are recommending skilled nursing facility -Get SLP evaluation as well given her difficulty with swallowing pills yesterday due to her respiratory status -Initially Unable to come off of BiPAP but now improved and tolerating 10 Liters of HFNC  Abnormal LFT's, improved  -Patient's AST was 56 and ALT was 50; Now AST is 16 and ALT is 44 -Continue to Monitor and Trend and if remain elevated or not improving will obtain RUQ U/S and Acute Hepatitis Panel  -Repeat CMP in the AM   Leukocytosis; improved  -Improved initially WBC went from 11.4 -> 10.0 but worsened in the setting of IV Steroid Demargination to 12.3 and is now 10.2 -Continue to Monitor and  Trend -Repeat CBC in AM   Anxiety/Generalized Anxiety Disorder -Continue with anxiolytics at the reduced dose with alprazolam 0.25 mg p.o. 3 times daily as needed for anxiety  Obesity -Estimated body mass index is 37.95 kg/m as calculated from the following:   Height as of this encounter: _0  (1.549 m).   Weight as of this encounter: 91.1 kg. -Weight Loss and Dietary Counseling given   DVT prophylaxis: Enoxaparin 40 mg sq q24h Code Status: Partial Code but was documented as a FULL CODE 7 days ago so will need to Clarify; she does not want to be intubated Family Communication: No family present at bedside  Disposition Plan: Pending further clinical improvement and evaluation by PT OT  Consultants:   PCCM/pulmonary  Palliative Care   Procedures: None   Antimicrobials:  Anti-infectives (From admission, onward)   Start     Dose/Rate Route Frequency Ordered Stop   01/20/19 1330  doxycycline (VIBRA-TABS) tablet 100 mg     100 mg Oral Every 12 hours 01/20/19 1325 01/25/19 0959  Subjective: Seen and examined this morning at bedside and she had been removed off of her BiPAP and has been off for about an hour and tolerating high flow nasal cannula.  She is able to eat some foods morning is happy that her breathing is doing a little bit better.  No chest pain, lightheadedness or dizziness.  States that she is not coughing up very much.  No other concerns or complaints at this time  Objective: Vitals:   01/23/19 0900 01/23/19 1053 01/23/19 1147 01/23/19 1148  BP:  (!) 106/52 (!) 111/50   Pulse: 86 86 95   Resp: _0 Temp:  97.8 F (36.6 C)    TempSrc:  Oral    SpO2:   93%   Weight:    91.1 kg  Height:  _1  (1.549 m)      Intake/Output Summary (Last 24 hours) at 01/23/2019 1203 Last data filed at 01/22/2019 2321 Gross per 24 hour  Intake 0 ml  Output 1150 ml  Net -1150 ml   Filed Weights   01/23/19 1148  Weight: 91.1 kg   Examination: Physical  Exam:  Constitutional: WN/WD female currently in no real distress tolerating high flow nasal cannula and appears calm Eyes: Lids and conjunctivae normal, sclerae anicteric  ENMT: External Ears, Nose appear normal. Grossly normal hearing. Mucous membranes are moist. Neck: Appears normal, supple, no cervical masses, normal ROM, no appreciable thyromegaly; Difficult to assess JVD given body habitus Respiratory: Diminished to auscultation bilaterally with coarse breath sounds, no wheezing, rales, rhonchi or crackles.  She is not as tachypneic and appears calm on 10 L of high flow nasal cannula Cardiovascular: RRR, has a 2 out of 6 systolic murmur; trace extremity edema with right being slightly worse than left Abdomen: Soft, non-tender, Distended due to body habitus. Bowel sounds positive x4.  GU: Deferred. Musculoskeletal: No clubbing / cyanosis of digits/nails. No joint deformity upper and lower extremities. Skin: No rashes, lesions, ulcers on a limited skin evaluation. No induration; Warm and dry.  Neurologic: CN 2-12 grossly intact with no focal deficits. Romberg sign and cerebellar reflexes not assessed.  Psychiatric: Normal judgment and insight. Alert and oriented x 3. Normal mood and appropriate affect.   Data Reviewed: I have personally reviewed following labs and imaging studies  CBC: Recent Labs  Lab 01/20/19 1024 01/20/19 1115 01/21/19 0306 01/22/19 0302 01/23/19 0234  WBC 11.4*  --  10.0 12.3* 10.2  NEUTROABS 8.2*  --   --  11.0* 9.4*  HGB 9.0* 11.9* 8.6* 8.6* 8.5*  HCT 38.4 35.0* 34.8* 34.3* 33.7*  MCV 78.9*  --  76.5* 74.9* 73.6*  PLT 260  --  243 250 638   Basic Metabolic Panel: Recent Labs  Lab 01/20/19 1024 01/20/19 1115 01/21/19 0306 01/22/19 0302 01/23/19 0234  NA 147* 147* 145 142 141  K 3.4* 3.4* 4.8 3.8 3.8  CL 105  --  107 102 103  CO2 31  --  _2 GLUCOSE 268*  --  264* 243* 200*  BUN 12  --  _3 CREATININE 0.78  --  0.95 0.86 0.84   CALCIUM 9.0  --  8.8* 8.9 9.0  MG  --   --   --  2.5* 2.4  PHOS  --   --   --  3.0 2.9   GFR: Estimated Creatinine Clearance: 63.1 mL/min (by C-G formula based on SCr of 0.84 mg/dL). Liver Function Tests: Recent  Labs  Lab 01/20/19 1024 01/22/19 0302 01/23/19 0234  AST 56* 29 16  ALT 50* 56* 44  ALKPHOS 60 54 52  BILITOT 0.3 0.3 0.6  PROT 5.7* 5.8* 5.7*  ALBUMIN 3.1* 3.1* 3.2*   No results for input(s): LIPASE, AMYLASE in the last 168 hours. No results for input(s): AMMONIA in the last 168 hours. Coagulation Profile: No results for input(s): INR, PROTIME in the last 168 hours. Cardiac Enzymes: No results for input(s): CKTOTAL, CKMB, CKMBINDEX, TROPONINI in the last 168 hours. BNP (last 3 results) No results for input(s): PROBNP in the last 8760 hours. HbA1C: No results for input(s): HGBA1C in the last 72 hours. CBG: Recent Labs  Lab 01/22/19 0626 01/22/19 1146 01/22/19 1620 01/22/19 2141 01/23/19 0630  GLUCAP 208* 230* 243* 212* 215*   Lipid Profile: No results for input(s): CHOL, HDL, LDLCALC, TRIG, CHOLHDL, LDLDIRECT in the last 72 hours. Thyroid Function Tests: No results for input(s): TSH, T4TOTAL, FREET4, T3FREE, THYROIDAB in the last 72 hours. Anemia Panel: Recent Labs    01/20/19 1350  VITAMINB12 311  FERRITIN 7*  TIBC 442  IRON 15*  RETICCTPCT 2.0   Sepsis Labs: No results for input(s): PROCALCITON, LATICACIDVEN in the last 168 hours.  Recent Results (from the past 240 hour(s))  SARS CORONAVIRUS 2 (TAT 6-24 HRS) Nasopharyngeal Nasopharyngeal Swab     Status: None   Collection Time: 01/20/19  1:33 PM   Specimen: Nasopharyngeal Swab  Result Value Ref Range Status   SARS Coronavirus 2 NEGATIVE NEGATIVE Final    Comment: (NOTE) SARS-CoV-2 target nucleic acids are NOT DETECTED. The SARS-CoV-2 RNA is generally detectable in upper and lower respiratory specimens during the acute phase of infection. Negative results do not preclude SARS-CoV-2  infection, do not rule out co-infections with other pathogens, and should not be used as the sole basis for treatment or other patient management decisions. Negative results must be combined with clinical observations, patient history, and epidemiological information. The expected result is Negative. Fact Sheet for Patients: SugarRoll.be Fact Sheet for Healthcare Providers: https://www.woods-mathews.com/ This test is not yet approved or cleared by the [place] FDA and  has been authorized for detection and/or diagnosis of SARS-CoV-2 by FDA under an Emergency Use Authorization (EUA). This EUA will remain  in effect (meaning this test can be used) for the duration of the COVID-19 declaration under Section 56 4(b)(1) of the Act, 21 U.S.C. section 360bbb-3(b)(1), unless the authorization is terminated or revoked sooner. Performed at [facility], [place] [address], [place], [place] [address]   Respiratory Panel by PCR     Status: None   Collection Time: 01/21/19 11:43 PM   Specimen: Nasopharyngeal Swab; Respiratory  Result Value Ref Range Status   Adenovirus NOT DETECTED NOT DETECTED Final   Coronavirus 229E NOT DETECTED NOT DETECTED Final    Comment: (NOTE) The Coronavirus on the Respiratory Panel, DOES NOT test for the novel  Coronavirus (2019 nCoV)    Coronavirus HKU1 NOT DETECTED NOT DETECTED Final   Coronavirus NL63 NOT DETECTED NOT DETECTED Final   Coronavirus OC43 NOT DETECTED NOT DETECTED Final   Metapneumovirus NOT DETECTED NOT DETECTED Final   Rhinovirus / Enterovirus NOT DETECTED NOT DETECTED Final   Influenza A NOT DETECTED NOT DETECTED Final   Influenza B NOT DETECTED NOT DETECTED Final   Parainfluenza Virus 1 NOT DETECTED NOT DETECTED Final   Parainfluenza Virus 2 NOT DETECTED NOT DETECTED Final   Parainfluenza Virus 3 NOT DETECTED NOT DETECTED  Final   Parainfluenza Virus 4 NOT DETECTED NOT DETECTED Final   Respiratory  Syncytial Virus NOT DETECTED NOT DETECTED Final   Bordetella pertussis NOT DETECTED NOT DETECTED Final   Chlamydophila pneumoniae NOT DETECTED NOT DETECTED Final   Mycoplasma pneumoniae NOT DETECTED NOT DETECTED Final    Comment: Performed at [facility], [place] [address] [place],  [address]     RN Pressure Injury Documentation: Pressure Injury 08/30/18 Buttocks Right;Medial Stage II -  Partial thickness loss of dermis presenting as a shallow open ulcer with a red, pink wound bed without slough. (Active)  08/30/18 2134  Location: Buttocks  Location Orientation: Right;Medial  Staging: Stage II -  Partial thickness loss of dermis presenting as a shallow open ulcer with a red, pink wound bed without slough.  Wound Description (Comments):   Present on Admission:    Radiology Studies: CT CHEST WO CONTRAST  Result Date: 01/21/2019 CLINICAL DATA:  COPD exacerbation EXAM: CT CHEST WITHOUT CONTRAST TECHNIQUE: Multidetector CT imaging of the chest was performed following the standard protocol without IV contrast. COMPARISON:  Chest x-ray 01/21/2019, CT chest 10/29/2016, 03/21/2015 FINDINGS: Cardiovascular: Limited evaluation without intravenous contrast. Moderate aortic atherosclerosis. No aneurysmal dilatation. Coronary vascular calcification. 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 Borderline to mild cardiomegaly. Trace pericardial effusion. Mediastinum/Nodes: Trachea is midline. Possible subcentimeter left thyroid nodule without change. No significant change in mediastinal adenopathy. Index prevascular lymph node measures 11 mm compared with 13 mm previously. Index right paratracheal node measures 12 mm compared with 13.5 mm previously. Probable hilar adenopathy though difficult to measure without intravenous contrast. Esophagus is unremarkable. Lungs/Pleura: Trace left-sided pleural effusion. Small right-sided pleural effusion.  Partial consolidations at the right greater than left lung bases. Advanced emphysema. Stable scattered pulmonary nodules in the right middle and upper lobes measuring up to 5 mm. Negative for pneumothorax. Upper Abdomen: No acute abnormality Musculoskeletal: No acute or suspicious lesion IMPRESSION: 1. Trace left-sided pleural effusion. Small right-sided pleural effusion. Partial consolidations at the right greater than left lung bases, could reflect atelectasis or pneumonia. 2. Advanced emphysema. 3. Stable scattered pulmonary nodules in the right middle and upper lobes measuring up to 5 mm, likely benign given lack of interval change. 4. 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 5. Trace pericardial effusion 6. Stable to slight decreased mediastinal adenopathy Aortic Atherosclerosis (ICD10-I70.0) and Emphysema (ICD10-J43.9). Aortic Atherosclerosis (ICD10-I70.0) and Emphysema (ICD10-J43.9). Electronically Signed   By: Donavan Foil M.D.   On: 01/21/2019 22:48   DG CHEST PORT 1 VIEW  Result Date: 01/23/2019 CLINICAL DATA:  Shortness of breath. EXAM: PORTABLE CHEST 1 VIEW COMPARISON:  January 22, 2019. FINDINGS: Stable cardiomegaly. No pneumothorax is noted. Stable bilateral lung opacities are noted, right greater than left, concerning for pneumonia. Small pleural effusions may be present. Bony thorax is unremarkable. IMPRESSION: No active disease. Electronically Signed   By: Marijo Conception M.D.   On: 01/23/2019 08:53   DG CHEST PORT 1 VIEW  Result Date: 01/22/2019 CLINICAL DATA:  Shortness of breath. EXAM: PORTABLE CHEST 1 VIEW COMPARISON:  One day prior FINDINGS: Midline trachea. Mild cardiomegaly. Left-sided aortic arch. Small left and trace right pleural effusions, similar. No pneumothorax. Pulmonary artery enlargement. Bibasilar airspace disease, similar. IMPRESSION: No significant change since 1 day prior. Left larger than right pleural effusions with bibasilar airspace disease.  Cardiomegaly without congestive failure. Left-sided aortic arch. Pulmonary artery enlargement suggests pulmonary arterial hypertension. Electronically Signed  By: Abigail Miyamoto M.D.   On: 01/22/2019 09:50   Scheduled Meds:  acetaZOLAMIDE  250 mg Oral BID   arformoterol  15 mcg Nebulization BID   budesonide (PULMICORT) nebulizer solution  0.25 mg Nebulization BID   chlorhexidine gluconate (MEDLINE KIT)  15 mL Mouth Rinse BID   doxycycline  100 mg Oral Q12H   enoxaparin (LOVENOX) injection  40 mg Subcutaneous Q24H   fluticasone  1 spray Each Nare Daily   furosemide  40 mg Oral Daily   guaiFENesin  600 mg Oral BID   insulin aspart  0-15 Units Subcutaneous TID WC   insulin glargine  20 Units Subcutaneous QHS   ipratropium-albuterol  3 mL Nebulization Q6H   iron polysaccharides  150 mg Oral Daily   methylPREDNISolone (SOLU-MEDROL) injection  60 mg Intravenous Q8H   pantoprazole  40 mg Oral Daily   PARoxetine  40 mg Oral q morning - 10a   umeclidinium bromide  1 puff Inhalation Daily   Continuous Infusions:  sodium chloride 500 mL (01/21/19 2220)    LOS: 3 days   Kerney Elbe, DO [facility] PAGER is on AMION  If 7PM-7AM, please contact night-coverage [email or web address]

## 2019-01-23 NOTE — Progress Notes (Signed)
pulm reviewed chart Will see agan during week -or call if needed     SIGNATURE    Dr. Brand Males, M.D., F.C.C.P,  Pulmonary and Critical Care Medicine Staff Physician, [place] Director - Interstitial Lung Disease  Program  Pulmonary [place] at [place], [place], [address]  Pager: [phone], If no answer or between  15:00h - 7:00h: call [phone] Telephone: [phone]  3:49 PM 01/23/2019

## 2019-01-23 NOTE — Progress Notes (Signed)
Pt requested bipap be taken off at 4pm so she could eat her dinner and take meds.  Pt satting in mid-90's on 10L HFNC at this time.  Will continue to monitor.

## 2019-01-24 ENCOUNTER — Inpatient Hospital Stay (HOSPITAL_COMMUNITY): Payer: Medicare Other

## 2019-01-24 DIAGNOSIS — J9601 Acute respiratory failure with hypoxia: Secondary | ICD-10-CM

## 2019-01-24 DIAGNOSIS — J9602 Acute respiratory failure with hypercapnia: Secondary | ICD-10-CM

## 2019-01-24 LAB — COMPREHENSIVE METABOLIC PANEL
ALT: 38 U/L (ref 0–44)
AST: 22 U/L (ref 15–41)
Albumin: 3.1 g/dL — ABNORMAL LOW (ref 3.5–5.0)
Alkaline Phosphatase: 55 U/L (ref 38–126)
Anion gap: 9 (ref 5–15)
BUN: 25 mg/dL — ABNORMAL HIGH (ref 8–23)
CO2: 26 mmol/L (ref 22–32)
Calcium: 9 mg/dL (ref 8.9–10.3)
Chloride: 106 mmol/L (ref 98–111)
Creatinine, Ser: 0.8 mg/dL (ref 0.44–1.00)
GFR calc Af Amer: >60 mL/min (ref >60)
GFR calc non Af Amer: >60 mL/min (ref >60)
Glucose, Bld: 283 mg/dL — ABNORMAL HIGH (ref 70–99)
Potassium: 3.8 mmol/L (ref 3.5–5.1)
Sodium: 141 mmol/L (ref 135–145)
Total Bilirubin: 0.5 mg/dL (ref 0.3–1.2)
Total Protein: 5.7 g/dL — ABNORMAL LOW (ref 6.5–8.1)

## 2019-01-24 LAB — CBC WITH DIFFERENTIAL/PLATELET
Abs Immature Granulocytes: 0.06 10*3/uL (ref 0.00–0.07)
Basophils Absolute: 0 10*3/uL (ref 0.0–0.1)
Basophils Relative: 0 %
Eosinophils Absolute: 0 10*3/uL (ref 0.0–0.5)
Eosinophils Relative: 0 %
HCT: 35 % — ABNORMAL LOW (ref 36.0–46.0)
Hemoglobin: 8.7 g/dL — ABNORMAL LOW (ref 12.0–15.0)
Immature Granulocytes: 1 %
Lymphocytes Relative: 7 %
Lymphs Abs: 0.7 10*3/uL (ref 0.7–4.0)
MCH: 18.5 pg — ABNORMAL LOW (ref 26.0–34.0)
MCHC: 24.9 g/dL — ABNORMAL LOW (ref 30.0–36.0)
MCV: 74.5 fL — ABNORMAL LOW (ref 80.0–100.0)
Monocytes Absolute: 1 10*3/uL (ref 0.1–1.0)
Monocytes Relative: 11 %
Neutro Abs: 7.8 10*3/uL — ABNORMAL HIGH (ref 1.7–7.7)
Neutrophils Relative %: 81 %
Platelets: 255 10*3/uL (ref 150–400)
RBC: 4.7 MIL/uL (ref 3.87–5.11)
RDW: 19.7 % — ABNORMAL HIGH (ref 11.5–15.5)
WBC: 9.5 10*3/uL (ref 4.0–10.5)
nRBC: 0.8 % — ABNORMAL HIGH (ref 0.0–0.2)

## 2019-01-24 LAB — GLUCOSE, CAPILLARY
Glucose-Capillary: 225 mg/dL — ABNORMAL HIGH (ref 70–99)
Glucose-Capillary: 360 mg/dL — ABNORMAL HIGH (ref 70–99)
Glucose-Capillary: 287 mg/dL — ABNORMAL HIGH (ref 70–99)
Glucose-Capillary: 213 mg/dL — ABNORMAL HIGH (ref 70–99)

## 2019-01-24 LAB — MAGNESIUM: Magnesium: 2.3 mg/dL (ref 1.7–2.4)

## 2019-01-24 LAB — LEGIONELLA PNEUMOPHILA SEROGP 1 UR AG: L. pneumophila Serogp 1 Ur Ag: NEGATIVE

## 2019-01-24 LAB — PHOSPHORUS: Phosphorus: 3.3 mg/dL (ref 2.5–4.6)

## 2019-01-24 MED ORDER — FUROSEMIDE 10 MG/ML IJ SOLN
20.0000 mg | Freq: Once | INTRAMUSCULAR | Status: AC
  Administered 2019-01-24: 20 mg via INTRAVENOUS
  Filled 2019-01-24: qty 2

## 2019-01-24 NOTE — Progress Notes (Signed)
PROGRESS NOTE    [name]  HDQ:[id number] DOB: Jun 13, 1945 DOA: 01/20/2019 PCP: Binnie Rail, MD  Brief Narrative:  HPI per Dr. [name] on 01/20/2019 [name] is a 74 y.o. female with medical history significant of COPD baseline 6 L of oxygen at all times, bronchiectasis, diastolic CHF, HLD, HTN, mediastinal mass, CT, pulmonary nodules, Tobacco abuse psoriasis, GERD, diabetes, presented with increasing short of breath since yesterday, with worsening of wheezing and occasional dry cough no fevers no chills no chest pain no leg edema she denied any Covid contact.  Denied any muscle ache night sweat, dysuria diarrhea.  ED Course: ABG showed mixed hypoxia and hypercapnia, received Solu-Medrol and bronchodilator.  **Interim History  Patient continued to remain on the BiPAP this morning and was transitioned to high flow nasal cannula they day before yesterday however had to go back on the BiPAP after a little while.  She was placed on 6 to 8 L. She was given additional dose of IV Lasix and obtained a CT of the chest without contrast and a pulmonary consult.  01/21/2018: CT of the chest showed a trace left-sided pleural effusion with a small right-sided pleural effusion with partial consolidations at the right greater than left lung bases which could reflect atelectasis or pneumonia.  Patient does have advanced emphysema and stable scattered pulmonary nodules in the right middle and upper lobes measuring up to 5 mm.  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  I spoke with pulmonary and her respiratory virus panel was negative and they feel that her lung function is further compromised with the presence of the pleural effusion and Dr. [name] believes that she may be a terminal decline.  He recommends continue the BiPAP for now and recommends okay to give concomitant  opioids and anxiolytics for symptom relief and eat if tolerated.  They are checking in a acetylcholine receptor antibody to rule out reversible causes and he recommended obtaining palliative care conversation having goals of care discussion with the family since he believes that this hospitalization may be a terminal event.  01/22/2018: She is breathing much better today and has been weaned off of BiPAP and then transition to high flow nasal cannula and is on 10 L with saturations in the mid 90s.  Is able to tolerate her food off of BiPAP has been off of it for a while and not feeling as dyspneic. Repeat CXR showed "Stable cardiomegaly. No pneumothorax is noted. Stable bilateral lung opacities are noted, right greater than left, concerning for pneumonia. Small pleural effusions may be present. Bony thorax is unremarkable."  01/23/2018: She has been intermittently using the BiPAP and yesterday had to be placed back yesterday afternoon but came off again and was placed back on 10 L high flow nasal cannula to eat dinner and take meds.  Pulmonary to see later again this week and palliative care consultation is still pending.  He is improved today and is on 8 to 9 L of high flow nasal cannula and states that she is actually feeling better and not as short of breath.  Assessment & Plan:   Active Problems:   COPD (chronic obstructive pulmonary disease) (HCC)   Chronic respiratory failure (HCC)   COPD exacerbation (HCC)  Acute on Chronic Hypoxic and Hypercapnic respiratory failure, due to COPD exacerbation and bronchiectasis possible concomitant acute on chronic diastolic CHF exacerbation, improving slowly -  Admitted to progressive unit inpatient given her use of BiPAP; had to be placed on a nonrebreather and she desaturated down to 70% when it was taken off; she has been weaned off of it and is on 8 to 9 L of high flow nasal cannula today -SpO2: 98 % O2 Flow Rate (L/min): 9 L/min FiO2 (%): 100 % -At baseline  patient wears 6 L of nasal cannula saturations in the low to mid 90s -Titrate oxygen sat O2 saturation 91 to 93%.   -My colleague Discussed with ED physician regarding using BiPAP,  confirmation Covid test sent, if negative patient will be placed on BiPAP.   -May need Trilogy to go home, hypercapnia seems like a chronic problem, as her Ph is compensated.    Had to be placed back on BiPAP 10/5 to assist with adequate ventilation -CXR today showed "Stable cardiomegaly. No pneumothorax is noted. Stable bilateral lung opacities are noted, right greater than left, concerning for pneumonia. Small pleural effusions may be present. Bony thorax is unremarkable" -Obtaining a Pulm Consult but likely first will obtain a CT of the Chest to further delineate -CT Chest w/o Contrast showed "Trace left-sided pleural effusion. Small right-sided pleural effusion. Partial consolidations at the right greater than left lung bases, could reflect atelectasis or pneumonia. 2. Advanced emphysema. 3. Stable scattered pulmonary nodules in the right middle and upper lobes measuring up to 5 mm, likely benign given lack of interval change. 4. 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 5. Trace pericardial effusion 6. Stable to slight decreased mediastinal adenopathy."  -She had an ABG done which showed a pH of 7.315, PCO2 of 69.6, PO2 of 85.0, bicarbonate level 35.5, and an O2 saturation of 95% -On p.o. doxycycline and will continue for now  -IV Solu-Medrol dosing was increased from 40 every 6h to 60 mg every 8h will continue today and de-escalate to every 12h today  -We will discontinue inhalers and continue with nebulizers breathing meds.  Held [place] and Incruse Ellipta and started the patient on Brovana and budesonide and will continue DuoNeb 3 mL every 6 scheduled   -Continue p.o. Acetazolamide to 50 mg p.o. Twice daily as well as p.o furosemide; Given a another dose of IV Lasix but at 20 mg -Decreased  her Xanax from 1 mg to 0.25 mg 3 times daily as needed, -Discontinue Benadryl at bedtime. -Continue with Guaifenesin 600 mg p.o. twice daily -Continue with albuterol inhaler 2 puffs elations every 6 as needed for wheezing or shortness of breath -SARS-CoV-2 Testing Negative (POC and PCR)  -We will need an ambulatory home O2 screen prior to discharge if able -Repeat chest x-ray in the a.m.  -Continue with incentive spirometry, and flutter valve -Pulmonary consulted and they checked a respiratory virus panel which was negative and now they are checking acetylcholine receptor antibodies to look for any further reversible causes.  Dr. [name] feels that this may be a terminal decline and that this hospitalization may be a terminal event; May allow to eat if she tolerates it but could not come off the BiPAP long enough as she had severe air hunger and dyspnea -Consulted palliative care for goals of care discussion and further evaluation and they are still to see the patient -Patient's respiratory status improved yesterday and she came off BiPAP and tolerating 9 L high flow nasal cannula and able to eat again today  -Appreciate further care and further Pulmonary recommendations  Poorly Controlled Diabetes Mellitus Type 2 -We will  reduce home Lantus dosing from 25 units to 20 units -Continue with moderate NovoLog/scale insulin AC -Sugars are likely to elevate in the setting of steroid demargination and have been ranging from 166-380 -Recent hemoglobin A1c done on 11/02/2018 was 9.4  -Continue monitor blood sugars carefully and adjust insulin as necessary -Diabetes education coordinator has been consulted for further evaluation recommendations  Chronic Microcytic Anemia -Patient's Hb/Hct went from 11.9/35.0 -> 8.6/34.8 -> 8.6/34.3 -> 8.5/33.7 -> 8.7/35.0 and is Stable -Check Anemia Panel showed an iron level of 15, U IBC of 427, TIBC 442, saturation ratios of 3%, ferritin level 7, and vitamin B12  level 311 -We will start the patient on iron polysaccharides 150 mg p.o. daily -Check FOBT and still pending to be done -Continue to monitor for signs and symptoms of bleeding; currently no overt bleeding noted -Repeat CBC in a.m.  Acute on Chronic Diastolic CHF -Symptoms initially felt to be mainly due to COPD exacerbation and her x-ray looks no significant changes compared to that in October this year examination she appears a little volume overloaded and she does have cardiomegaly -BNP was 106.9 -Given another dose of IV Lasix the day before yesterday in addition to her home p.o. Lasix; Will continue po Lasix again and give one dose of IV 20 mg of Lasix as well  -She is -2.876 Liters since Admission -Strict I's and O's and daily weights; may need to fluid restrict  -Continue monitor for signs and symptoms of volume overload and repeat a chest x-ray in the a.m.  Pulmonary nodules, bilateral lower and mediastinal lymphadenosis/mass -Uunknown etiology, according to Pulm/Critical Care note in October; he is Dr. Fuller Plan -Had had PET scan in 2017 showed malignant features, and patient could not tolerate bronchoscopy. -Normally maintained on Trelegy Ellipta and 20 mg of p.o. prednisone daily -Will get Pulmonary Involved and obtain a CT Chest w/o Contrast  -See Above   Hypernatremia -Improved. Na+ went from 147 -> 142 -> 141 -> 141 -? Hypervolemic Hypernatremia -Given an additional dose of IV Lasix again this AM  -Continue to Monitor and Trend -Repeat CMP in AM   Hypokalemia -Improved as K+ is now 3.8 and stable  -Continue to Monitor and Replete as Necessary -Repeat CMP in AM   Deconditioning -Patient under palliative care on last admission, her COPD looks very advanced, - Gold stage IV, FEV1/FVC 47% in August 2020.   -Palliative Care Consulted this Admission as well -Will need close pulmonary follow-up and may decide to consult this Admission -Will need PT/OT  evaluation and they are recommending skilled nursing facility -Get SLP evaluation as well given her difficulty with swallowing pills yesterday due to her respiratory status -Initially Unable to come off of BiPAP but now improved and tolerating 10 Liters of HFNC been intermittently using it -Once more stable from a respiratory perspective will need PT OT -Will place consult for AM   Abnormal LFT's, improved  -Patient's AST was 56 and ALT was 50; Now AST is 22 and ALT is 38 -Continue to Monitor and Trend and if remain elevated or not improving will obtain RUQ U/S and Acute Hepatitis Panel  -Repeat CMP in the AM   Leukocytosis; improved  -Improved initially WBC went from 11.4 -> 10.0 but worsened in the setting of IV Steroid Demargination to 12.3 and is now trending down and is 9.5 this morning -Continue to Monitor and Trend -Repeat CBC in AM   Anxiety/Generalized Anxiety Disorder -Continue with anxiolytics at the reduced dose  with alprazolam 0.25 mg p.o. 3 times daily as needed for anxiety  Obesity -Estimated body mass index is 37.95 kg/m as calculated from the following:   Height as of this encounter: '5\' 1"'$  (1.549 m).   Weight as of this encounter: 91.1 kg. -Weight Loss and Dietary Counseling given   DVT prophylaxis: Enoxaparin 40 mg sq q24h Code Status: Partial Code but was documented as a FULL CODE 7 days ago so will need to Clarify; she does not want to be intubated Family Communication: No family present at bedside  Disposition Plan: Pending further clinical improvement and evaluation by PT OT; she is slowly improving and was mildly improved today.  Consultants:   PCCM/pulmonary  Palliative Care   Procedures: None   Antimicrobials:  Anti-infectives (From admission, onward)   Start     Dose/Rate Route Frequency Ordered Stop   01/20/19 1330  doxycycline (VIBRA-TABS) tablet 100 mg     100 mg Oral Every 12 hours 01/20/19 1325 01/25/19 0959     Subjective: Seen and  examined this morning at bedside and she was on 9 L high flow nasal cannula and states that she is feeling much better today than she was yesterday.  She is able to tolerate being off of BiPAP and states that she is actually eating well and happy that she is improving from a respiratory standpoint slightly.  No nausea or vomiting.  Able to have a conversation and not become dyspneic.  No other concerns or complaints at this time.  Objective: Vitals:   01/23/19 2100 01/24/19 0053 01/24/19 0245 01/24/19 0510  BP:  (!) 100/53  107/60  Pulse:  86 84 78  Resp:  '16 17 20  '$ Temp:  98.3 F (36.8 C)  97.9 F (36.6 C)  TempSrc:  Axillary  Axillary  SpO2: 100% 99% 100% 100%  Weight:      Height:        Intake/Output Summary (Last 24 hours) at 01/24/2019 0805 Last data filed at 01/24/2019 2703 Gross per 24 hour  Intake 400 ml  Output --  Net 400 ml   Filed Weights   01/23/19 1148  Weight: 91.1 kg   Examination: Physical Exam:  Constitutional: WN/WD obese African-American female currently improved from a respiratory standpoint and does not appear to be in any acute distress appears calm today. Eyes: Lids and conjunctivae normal, sclerae anicteric  ENMT: External Ears, Nose appear normal. Grossly normal hearing. Mucous membranes are moist. Neck: Appears normal, supple, no cervical masses, normal ROM, no appreciable thyromegaly; no JVD Respiratory: Diminished to auscultation bilaterally, no wheezing, rales, rhonchi or crackles. Normal respiratory effort and patient is not tachypenic. No accessory muscle use.  She is not is tachypneic and appears calm on 9 L of high flow nasal cannula Cardiovascular: RRR, has a 2 out of 6 systolic murmur. S1 and S2 auscultated.  Has very mild extremity edema.   Abdomen: Soft, non-tender, distended secondary body habitus. No masses palpated. No appreciable hepatosplenomegaly. Bowel sounds positive x4.  GU: Deferred. Musculoskeletal: No clubbing / cyanosis of  digits/nails. No joint deformity upper and lower extremities. Skin: No rashes, lesions, ulcers on a limited skin evaluation. No induration; Warm and dry.  Neurologic: CN 2-12 grossly intact with no focal deficits. Romberg sign and cerebellar reflexes not assessed.  Psychiatric: Normal judgment and insight. Alert and oriented x 3. Normal mood and appropriate affect.   Data Reviewed: I have personally reviewed following labs and imaging studies  CBC: Recent Labs  Lab 01/20/19 1024 01/20/19 1115 01/21/19 0306 01/22/19 0302 01/23/19 0234 01/24/19 0449  WBC 11.4*  --  10.0 12.3* 10.2 9.5  NEUTROABS 8.2*  --   --  11.0* 9.4* 7.8*  HGB 9.0* 11.9* 8.6* 8.6* 8.5* 8.7*  HCT 38.4 35.0* 34.8* 34.3* 33.7* 35.0*  MCV 78.9*  --  76.5* 74.9* 73.6* 74.5*  PLT 260  --  243 250 247 585   Basic Metabolic Panel: Recent Labs  Lab 01/20/19 1024 01/20/19 1115 01/21/19 0306 01/22/19 0302 01/23/19 0234 01/24/19 0449  NA 147* 147* 145 142 141 141  K 3.4* 3.4* 4.8 3.8 3.8 3.8  CL 105  --  107 102 103 106  CO2 31  --  '30 30 27 26  '$ GLUCOSE 268*  --  264* 243* 200* 283*  BUN 12  --  '13 17 22 '$ 25*  CREATININE 0.78  --  0.95 0.86 0.84 0.80  CALCIUM 9.0  --  8.8* 8.9 9.0 9.0  MG  --   --   --  2.5* 2.4 2.3  PHOS  --   --   --  3.0 2.9 3.3   GFR: Estimated Creatinine Clearance: 66.3 mL/min (by C-G formula based on SCr of 0.8 mg/dL). Liver Function Tests: Recent Labs  Lab 01/20/19 1024 01/22/19 0302 01/23/19 0234 01/24/19 0449  AST 56* '29 16 22  '$ ALT 50* 56* 44 38  ALKPHOS 60 54 52 55  BILITOT 0.3 0.3 0.6 0.5  PROT 5.7* 5.8* 5.7* 5.7*  ALBUMIN 3.1* 3.1* 3.2* 3.1*   No results for input(s): LIPASE, AMYLASE in the last 168 hours. No results for input(s): AMMONIA in the last 168 hours. Coagulation Profile: No results for input(s): INR, PROTIME in the last 168 hours. Cardiac Enzymes: No results for input(s): CKTOTAL, CKMB, CKMBINDEX, TROPONINI in the last 168 hours. BNP (last 3 results) No  results for input(s): PROBNP in the last 8760 hours. HbA1C: No results for input(s): HGBA1C in the last 72 hours. CBG: Recent Labs  Lab 01/23/19 0630 01/23/19 1140 01/23/19 1614 01/23/19 2124 01/24/19 0625  GLUCAP 215* 233* 166* 380* 225*   Lipid Profile: No results for input(s): CHOL, HDL, LDLCALC, TRIG, CHOLHDL, LDLDIRECT in the last 72 hours. Thyroid Function Tests: No results for input(s): TSH, T4TOTAL, FREET4, T3FREE, THYROIDAB in the last 72 hours. Anemia Panel: No results for input(s): VITAMINB12, FOLATE, FERRITIN, TIBC, IRON, RETICCTPCT in the last 72 hours. Sepsis Labs: No results for input(s): PROCALCITON, LATICACIDVEN in the last 168 hours.  Recent Results (from the past 240 hour(s))  SARS CORONAVIRUS 2 (TAT 6-24 HRS) Nasopharyngeal Nasopharyngeal Swab     Status: None   Collection Time: 01/20/19  1:33 PM   Specimen: Nasopharyngeal Swab  Result Value Ref Range Status   SARS Coronavirus 2 NEGATIVE NEGATIVE Final    Comment: (NOTE) SARS-CoV-2 target nucleic acids are NOT DETECTED. The SARS-CoV-2 RNA is generally detectable in upper and lower respiratory specimens during the acute phase of infection. Negative results do not preclude SARS-CoV-2 infection, do not rule out co-infections with other pathogens, and should not be used as the sole basis for treatment or other patient management decisions. Negative results must be combined with clinical observations, patient history, and epidemiological information. The expected result is Negative. Fact Sheet for Patients: SugarRoll.be Fact Sheet for Healthcare Providers: https://www.woods-mathews.com/ This test is not yet approved or cleared by the [place] FDA and  has been authorized for detection and/or diagnosis of SARS-CoV-2 by FDA under an Emergency Use  Authorization (EUA). This EUA will remain  in effect (meaning this test can be used) for the duration of the COVID-19  declaration under Section 56 4(b)(1) of the Act, 21 U.S.C. section 360bbb-3(b)(1), unless the authorization is terminated or revoked sooner. Performed at [facility], [place] [address] [place], [place] [address]   Respiratory Panel by PCR     Status: None   Collection Time: 01/21/19 11:43 PM   Specimen: Nasopharyngeal Swab; Respiratory  Result Value Ref Range Status   Adenovirus NOT DETECTED NOT DETECTED Final   Coronavirus 229E NOT DETECTED NOT DETECTED Final    Comment: (NOTE) The Coronavirus on the Respiratory Panel, DOES NOT test for the novel  Coronavirus (2019 nCoV)    Coronavirus HKU1 NOT DETECTED NOT DETECTED Final   Coronavirus NL63 NOT DETECTED NOT DETECTED Final   Coronavirus OC43 NOT DETECTED NOT DETECTED Final   Metapneumovirus NOT DETECTED NOT DETECTED Final   Rhinovirus / Enterovirus NOT DETECTED NOT DETECTED Final   Influenza A NOT DETECTED NOT DETECTED Final   Influenza B NOT DETECTED NOT DETECTED Final   Parainfluenza Virus 1 NOT DETECTED NOT DETECTED Final   Parainfluenza Virus 2 NOT DETECTED NOT DETECTED Final   Parainfluenza Virus 3 NOT DETECTED NOT DETECTED Final   Parainfluenza Virus 4 NOT DETECTED NOT DETECTED Final   Respiratory Syncytial Virus NOT DETECTED NOT DETECTED Final   Bordetella pertussis NOT DETECTED NOT DETECTED Final   Chlamydophila pneumoniae NOT DETECTED NOT DETECTED Final   Mycoplasma pneumoniae NOT DETECTED NOT DETECTED Final    Comment: Performed at [facility], [place]. [address], [place], [place] [address]   RN Pressure Injury Documentation: Pressure Injury 08/30/18 Buttocks Right;Medial Stage II -  Partial thickness loss of dermis presenting as a shallow open ulcer with a red, pink wound bed without slough. (Active)  08/30/18 2134  Location: Buttocks  Location Orientation: Right;Medial  Staging: Stage II -  Partial thickness loss of dermis presenting as a shallow open ulcer with a red, pink wound bed without slough.  Wound  Description (Comments):   Present on Admission:    Radiology Studies: DG CHEST PORT 1 VIEW  Result Date: 01/23/2019 CLINICAL DATA:  Shortness of breath. EXAM: PORTABLE CHEST 1 VIEW COMPARISON:  January 22, 2019. FINDINGS: Stable cardiomegaly. No pneumothorax is noted. Stable bilateral lung opacities are noted, right greater than left, concerning for pneumonia. Small pleural effusions may be present. Bony thorax is unremarkable. IMPRESSION: No active disease. Electronically Signed   By: Marijo Conception M.D.   On: 01/23/2019 08:53   Scheduled Meds: . acetaZOLAMIDE  250 mg Oral BID  . arformoterol  15 mcg Nebulization BID  . budesonide (PULMICORT) nebulizer solution  0.25 mg Nebulization BID  . chlorhexidine gluconate (MEDLINE KIT)  15 mL Mouth Rinse BID  . doxycycline  100 mg Oral Q12H  . enoxaparin (LOVENOX) injection  40 mg Subcutaneous Q24H  . fluticasone  1 spray Each Nare Daily  . furosemide  40 mg Oral Daily  . guaiFENesin  600 mg Oral BID  . insulin aspart  0-15 Units Subcutaneous TID WC  . insulin glargine  20 Units Subcutaneous QHS  . ipratropium-albuterol  3 mL Nebulization Q6H  . iron polysaccharides  150 mg Oral Daily  . methylPREDNISolone (SOLU-MEDROL) injection  60 mg Intravenous Q12H  . pantoprazole  40 mg Oral Daily  . PARoxetine  40 mg Oral q morning - 10a  . umeclidinium bromide  1 puff Inhalation Daily  Continuous Infusions: . sodium chloride 500 mL (01/21/19 2220)    LOS: 4 days   Kerney Elbe, DO [facility] PAGER is on [place]  If 7PM-7AM, please contact night-coverage [email or web address]

## 2019-01-24 NOTE — Progress Notes (Signed)
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  Well-known to Dr. [name] in the outpatient clinic but last seen by him in October 2019.  At that point in time in October 2019 she was on home palliative care with chronic prednisone 20 mg/day and Trelegy inhaler and 5 L of oxygen with continued ongoing smoking.  She was dependent on her nephew for her groceries.  She had a history of recurrent exacerbations.  Her baseline CAT score was 27 and in 2018 she did not have hypercapnia and FEV1 was around 53%.  She had televisit on mid December 2020 with increased shortness of breath and complains that her pulse ox was dropping into the 70s even with minimal exertion and she was completely homebound because of this.  Xanax was helping.  [Normally she saturates in the 80s].  Increase in oxygen to nearly 7 L was recommended and a prednisone taper.  Along with doxycycline.  She had a follow-up televisit on January 13, 2019.  It was felt that she was improving.  She then got admitted on January 20, 2019 with worsening shortness of breath with ABG showing hypoxemia and hypercapnia and started on BiPAP, steroids systemic and bronchodilator.  She is been unable to come off BiPAP.  She insist on coming off BiPAP but she desaturates even though she is feeling okay.  She has class IV dyspnea.  Nurse is extremely distressed at the level of dyspnea coming off the BiPAP.  She is asking to eat.  Given the complete dependency on BiPAP and class IV dyspnea with severe hypoxemia pulmonary has been  consulted      Significant Hospital Events   01/20/2019 - admit  Consults:  01/21/2019 - pulm  Procedures:  bipap  Significant Diagnostic Tests:  x  Micro Data:  x  Antimicrobials:  x   Interim history/subjective:   01/24/2019 - few days ago was simply unable to come off of BiPAP and eat.  Currently on heated high flow nasal cannula and resting.  She is off the BiPAP.  She is able to talk full sentences.  She feels a lot better.  She almost looks like her baseline self only thing the oxygen needs are higher.  Acetylcholine receptor antibodies are pending.  She did get 1 dose of Lasix yesterday.  She is on Xanax and Paxil for her CNS issues.  She also got Diamox.  She is on doxycycline.  And she is on Solu-Medrol.  Objective   Blood pressure 105/66, pulse 82, temperature 98.6 F (37 C), temperature source Oral, resp. rate 18, height 5\' 1"  (1.549 m), weight 91.1 kg, SpO2 100 %.    FiO2 (%):  [40 %-100 %] 100 %   Intake/Output Summary (Last 24 hours) at 01/24/2019 1900 Last data filed at 01/24/2019 1718 Gross per 24 hour  Intake 400 ml  Output 1100 ml  Net -700 ml   Filed Weights   01/23/19 1148  Weight: 91.1 kg     General Appearance:  Looks c a lot better Head:  Normocephalic, without obvious abnormality, atraumatic Eyes:  PERRL -  yes, conjunctiva/corneas - muddy     Ears:  Normal external ear canals, both ears Nose:  G tube - no but has HFNC Throat:  ETT TUBE - no , OG tube - no Neck:  Supple,  No enlargement/tenderness/nodules Lungs: Clear to auscultation bilaterally,  distant Heart:  S1 and S2 normal, no murmur, CVP - no.  Pressors - no Abdomen:  Soft, no masses, no organomegaly Genitalia / Rectal:  Not done Extremities:  Extremities- intact with chronic edema Skin:  ntact in exposed areas . Sacral area - x Neurologic:  Sedation - non4 -> RASS - +1 . Moves all 4s - yes. CAM-ICU - neg . Orientation - x3+      Resolved Hospital Problem list    X  Assessment & Plan:  Acute on chronic hypoxemic and hypercapnic respiratory failure -at baseline both stage IV COPD with chronic prednisone 5 L hypoxemia.  Seems to have new onset of left-sided density possibly pleural effusion . RVP negative  01/24/2019 - much better with signle lasix and aecopd Rx  Plan -Continue oxygen support for pulse ox greater than 86% -Definite BiPAP at night -BiPAP as needed in the morning with oxygen support -Continue bronchodilatation -2-3 weeks steroids and then maintenance dose -Check arterial blood gas tomorrow -we will definitely need to ensure she is at BiPAP at night at home especially if she is hypercapnic  -If she declines then it is palliation.  If she stabilizes she might need skilled nursing facility versus going home with extra layer of support such as home physical therapy along with her pre-existing palliative care..     Best practice:  Diet: N.p.o. but for palliative reasons okay to eat especially because she is DNR and if she insists on eating Pain/Anxiety/Delirium protocol (if indicated): X VAP protocol (if indicated): Head of bed greater than 30 degrees DVT prophylaxis: According to the hospitalist GI prophylaxis: According to the hospitalist Glucose control: Needs sliding scale insulin but managed by the hospitalist Mobility: Bedrest Code Status: Partial code  Family Communication: Patient  disposition: Remain in 4 E.    SIGNATURE    Dr. Brand Males, M.D., F.C.C.P,  Pulmonary and Critical Care Medicine Staff Physician, [facility] Director - Interstitial Lung Disease  Program  Pulmonary [place] [facility] [place], [place], [address]  Pager: [phone], If no answer or between  15:00h - 7:00h: call [phone] Telephone: [phone]  7:00 PM 01/24/2019     LABS    PULMONARY Recent Labs  Lab 01/20/19 1115  HCO3 35.5*  TCO2 38*  O2SAT 95.0     CBC Recent Labs  Lab 01/22/19 0302 01/23/19 0234 01/24/19 0449  HGB 8.6* 8.5* 8.7*  HCT 34.3* 33.7* 35.0*  WBC 12.3* 10.2 9.5  PLT 250 247 255    COAGULATION No results for input(s): INR in the last 168 hours.  CARDIAC  No results for input(s): TROPONINI in the last 168 hours. No results for input(s): PROBNP in the last 168 hours.   CHEMISTRY Recent Labs  Lab 01/20/19 1024 01/20/19 1115 01/21/19 0306 01/22/19 0302 01/23/19 0234 01/24/19 0449  NA 147* 147* 145 142 141 141  K 3.4* 3.4* 4.8 3.8 3.8 3.8  CL 105  --  107 102 103 106  CO2 31  --  30 30 27 26   GLUCOSE 268*  --  264* 243* 200* 283*  BUN 12  --  13 17 22  25*  CREATININE 0.78  --  0.95 0.86 0.84 0.80  CALCIUM 9.0  --  8.8* 8.9 9.0 9.0  MG  --   --   --  2.5* 2.4 2.3  PHOS  --   --   --  3.0 2.9 3.3   Estimated Creatinine Clearance: 66.3 mL/min (by C-G formula based on SCr of 0.8 mg/dL).   LIVER Recent Labs  Lab 01/20/19 1024 01/22/19 0302 01/23/19 0234 01/24/19 0449  AST 56* 29 16 22   ALT 50* 56* 44 38  ALKPHOS 60 54 52 55  BILITOT 0.3 0.3 0.6 0.5  PROT 5.7* 5.8* 5.7* 5.7*  ALBUMIN 3.1* 3.1* 3.2* 3.1*     INFECTIOUS No results for input(s): LATICACIDVEN, PROCALCITON in the last 168 hours.   ENDOCRINE CBG (last 3)  Recent Labs    01/24/19 0625 01/24/19 1251 01/24/19 1653  GLUCAP 225* 360* 287*         IMAGING x48h  - image(s) personally visualized  -   highlighted in bold DG CHEST PORT 1 VIEW  Result Date: 01/24/2019 CLINICAL DATA:  Shortness of breath EXAM: PORTABLE CHEST 1 VIEW COMPARISON:  01/23/2019 FINDINGS: Stable cardiomegaly with central vascular congestion and persistent mid and lower lung airspace opacities versus pneumonia. Small effusions remain, larger on the left. Background emphysema noted with parenchymal scarring in the upper lobes. No pneumothorax. Trachea is midline. Aorta atherosclerotic and tortuous. IMPRESSION: Stable COPD/emphysema pattern with  bibasilar airspace disease/atelectasis and small effusions. No significant interval change. Electronically Signed   By: Jerilynn Mages.  Shick M.D.   On: 01/24/2019 09:17   DG CHEST PORT 1 VIEW  Result Date: 01/23/2019 CLINICAL DATA:  Shortness of breath. EXAM: PORTABLE CHEST 1 VIEW COMPARISON:  January 22, 2019. FINDINGS: Stable cardiomegaly. No pneumothorax is noted. Stable bilateral lung opacities are noted, right greater than left, concerning for pneumonia. Small pleural effusions may be present. Bony thorax is unremarkable. IMPRESSION: No active disease. Electronically Signed   By: Marijo Conception M.D.   On: 01/23/2019 08:53

## 2019-01-25 ENCOUNTER — Inpatient Hospital Stay (HOSPITAL_COMMUNITY): Payer: Medicare Other

## 2019-01-25 DIAGNOSIS — J441 Chronic obstructive pulmonary disease with (acute) exacerbation: Secondary | ICD-10-CM

## 2019-01-25 DIAGNOSIS — Z794 Long term (current) use of insulin: Secondary | ICD-10-CM

## 2019-01-25 DIAGNOSIS — J90 Pleural effusion, not elsewhere classified: Secondary | ICD-10-CM

## 2019-01-25 DIAGNOSIS — J471 Bronchiectasis with (acute) exacerbation: Secondary | ICD-10-CM

## 2019-01-25 DIAGNOSIS — Z7189 Other specified counseling: Secondary | ICD-10-CM

## 2019-01-25 DIAGNOSIS — Z515 Encounter for palliative care: Secondary | ICD-10-CM

## 2019-01-25 DIAGNOSIS — E118 Type 2 diabetes mellitus with unspecified complications: Secondary | ICD-10-CM

## 2019-01-25 DIAGNOSIS — I5033 Acute on chronic diastolic (congestive) heart failure: Secondary | ICD-10-CM

## 2019-01-25 DIAGNOSIS — J449 Chronic obstructive pulmonary disease, unspecified: Secondary | ICD-10-CM

## 2019-01-25 LAB — GLUCOSE, CAPILLARY
Glucose-Capillary: 248 mg/dL — ABNORMAL HIGH (ref 70–99)
Glucose-Capillary: 423 mg/dL — ABNORMAL HIGH (ref 70–99)
Glucose-Capillary: 403 mg/dL — ABNORMAL HIGH (ref 70–99)
Glucose-Capillary: 317 mg/dL — ABNORMAL HIGH (ref 70–99)

## 2019-01-25 LAB — CBC WITH DIFFERENTIAL/PLATELET
Abs Immature Granulocytes: 0.08 10*3/uL — ABNORMAL HIGH (ref 0.00–0.07)
Basophils Absolute: 0 10*3/uL (ref 0.0–0.1)
Basophils Relative: 0 %
Eosinophils Absolute: 0 10*3/uL (ref 0.0–0.5)
Eosinophils Relative: 0 %
HCT: 37 % (ref 36.0–46.0)
Hemoglobin: 9.1 g/dL — ABNORMAL LOW (ref 12.0–15.0)
Immature Granulocytes: 1 %
Lymphocytes Relative: 7 %
Lymphs Abs: 0.9 10*3/uL (ref 0.7–4.0)
MCH: 18.6 pg — ABNORMAL LOW (ref 26.0–34.0)
MCHC: 24.6 g/dL — ABNORMAL LOW (ref 30.0–36.0)
MCV: 75.5 fL — ABNORMAL LOW (ref 80.0–100.0)
Monocytes Absolute: 1 10*3/uL (ref 0.1–1.0)
Monocytes Relative: 9 %
Neutro Abs: 10.2 10*3/uL — ABNORMAL HIGH (ref 1.7–7.7)
Neutrophils Relative %: 83 %
Platelets: 264 10*3/uL (ref 150–400)
RBC: 4.9 MIL/uL (ref 3.87–5.11)
RDW: 19.7 % — ABNORMAL HIGH (ref 11.5–15.5)
WBC: 12.2 10*3/uL — ABNORMAL HIGH (ref 4.0–10.5)
nRBC: 0.7 % — ABNORMAL HIGH (ref 0.0–0.2)

## 2019-01-25 LAB — BLOOD GAS, ARTERIAL
Acid-Base Excess: 1.7 mmol/L (ref 0.0–2.0)
Bicarbonate: 26.8 mmol/L (ref 20.0–28.0)
Drawn by: 24487
FIO2: 52
O2 Saturation: 90.1 %
Patient temperature: 36.8
pCO2 arterial: 49.5 mmHg — ABNORMAL HIGH (ref 32.0–48.0)
pH, Arterial: 7.351 (ref 7.350–7.450)
pO2, Arterial: 62.2 mmHg — ABNORMAL LOW (ref 83.0–108.0)

## 2019-01-25 LAB — COMPREHENSIVE METABOLIC PANEL
ALT: 32 U/L (ref 0–44)
AST: 13 U/L — ABNORMAL LOW (ref 15–41)
Albumin: 3.2 g/dL — ABNORMAL LOW (ref 3.5–5.0)
Alkaline Phosphatase: 54 U/L (ref 38–126)
Anion gap: 9 (ref 5–15)
BUN: 24 mg/dL — ABNORMAL HIGH (ref 8–23)
CO2: 27 mmol/L (ref 22–32)
Calcium: 8.8 mg/dL — ABNORMAL LOW (ref 8.9–10.3)
Chloride: 105 mmol/L (ref 98–111)
Creatinine, Ser: 0.87 mg/dL (ref 0.44–1.00)
GFR calc Af Amer: >60 mL/min (ref >60)
GFR calc non Af Amer: >60 mL/min (ref >60)
Glucose, Bld: 228 mg/dL — ABNORMAL HIGH (ref 70–99)
Potassium: 3.4 mmol/L — ABNORMAL LOW (ref 3.5–5.1)
Sodium: 141 mmol/L (ref 135–145)
Total Bilirubin: 0.2 mg/dL — ABNORMAL LOW (ref 0.3–1.2)
Total Protein: 5.9 g/dL — ABNORMAL LOW (ref 6.5–8.1)

## 2019-01-25 LAB — MAGNESIUM: Magnesium: 2.3 mg/dL (ref 1.7–2.4)

## 2019-01-25 LAB — PHOSPHORUS: Phosphorus: 3.1 mg/dL (ref 2.5–4.6)

## 2019-01-25 LAB — GLUCOSE, RANDOM: Glucose, Bld: 424 mg/dL — ABNORMAL HIGH (ref 70–99)

## 2019-01-25 MED ORDER — INSULIN ASPART PROT & ASPART (70-30 MIX) 100 UNIT/ML ~~LOC~~ SUSP: 25.0000 [IU] | Freq: Once | SUBCUTANEOUS | Status: DC

## 2019-01-25 MED ORDER — INSULIN GLARGINE 100 UNIT/ML ~~LOC~~ SOLN
25.0000 [IU] | SUBCUTANEOUS | Status: AC
  Administered 2019-01-25: 25 [IU] via SUBCUTANEOUS
  Filled 2019-01-25: qty 0.25

## 2019-01-25 MED ORDER — INSULIN ASPART 100 UNIT/ML ~~LOC~~ SOLN
15.0000 [IU] | Freq: Once | SUBCUTANEOUS | Status: AC
  Administered 2019-01-25: 15 [IU] via SUBCUTANEOUS

## 2019-01-25 MED ORDER — POTASSIUM CHLORIDE CRYS ER 20 MEQ PO TBCR
40.0000 meq | EXTENDED_RELEASE_TABLET | Freq: Once | ORAL | Status: AC
  Administered 2019-01-25: 40 meq via ORAL
  Filled 2019-01-25: qty 2

## 2019-01-25 MED ORDER — IPRATROPIUM-ALBUTEROL 0.5-2.5 (3) MG/3ML IN SOLN
3.0000 mL | Freq: Four times a day (QID) | RESPIRATORY_TRACT | Status: DC
  Administered 2019-01-25 – 2019-01-29 (×14): 3 mL via RESPIRATORY_TRACT
  Filled 2019-01-25 (×12): qty 3

## 2019-01-25 NOTE — Progress Notes (Signed)
Patient with increase work of breathing, stating "she cant breathe" she is requesting to be placed back on BIPAP bipap placed and sats at 99% heart rate 96. rr 18. Report given to oncoming RN . Lovina Zuver, Bettina Gavia RN

## 2019-01-25 NOTE — Progress Notes (Signed)
PROGRESS NOTE  [name] [id number] DOB: 11-09-1945 DOA: 01/20/2019 PCP: Binnie Rail, MD  Brief History   Toni Ndiayeis a 74 y.o.femalewith medical history significant ofCOPDbaseline6 L of oxygen at all times, bronchiectasis, diastolic CHF, HLD, HTN, mediastinal mass, CT, pulmonary nodules, Tobacco abuse psoriasis, GERD, diabetes, presented withincreasing short of breathsince yesterday,with worsening of wheezing and occasional drycough no fevers no chills no chest pain no leg edema shedenied any Covid contact.Denied any muscle ache night sweat,dysuria diarrhea.ABG showed mixed hypoxia and hypercapnia,received Solu-Medrol and bronchodilator.  The patient required BIPAP and then was transitioned to high flow nasal cannula . PCCM was consulted. The patient has advanced emphysema and stable scattered pulmonoary nodules in the right middle and upper lobes. 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 Dr. [name] believes that the patient's pulmonary function is in terminal decline. Recommendation is to continue BIPAP as necessary for now and to treat symptoms with opioids and anxiolytics. Palliative care has been consulted. The patient is agreeable to discharge to home with hospice when she has obtained the maximal benefit of her inpatient stay. She continues to require BIPAP intermittently. Her oxygen requirements today are 8-10 L of heated high flow O2 by nasal cannula.  Consultants  . Palliative Care . PCCM  Procedures  . None  Antibiotics   Anti-infectives (From admission, onward)   Start     Dose/Rate Route Frequency Ordered Stop   01/20/19 1330  doxycycline (VIBRA-TABS) tablet 100 mg     100 mg Oral Every 12 hours 01/20/19 1325 01/25/19 0959    .  Subjective  The patient is resting quietly in bed. No new complaints.   Objective   Vitals:  Vitals:   01/25/19 1135 01/25/19 1224  BP: (!)  96/54   Pulse:    Resp:    Temp: 97.9 F (36.6 C)   SpO2: 97% 97%   Exam:  Constitutional:  . The patient is awake, alert, and oriented x 3. No acute distress. Respiratory:  . No increased work of breathing. . No wheezes, rales, or rhonchi . No tactile fremitus Cardiovascular:  . Regular rate and rhythm . No murmurs, ectopy, or gallups. . No lateral PMI. No thrills. Abdomen:  . Abdomen is soft, non-tender, non-distended . No hernias, masses, or organomegaly . Normoactive bowel sounds.  Musculoskeletal:  . No cyanosis, clubbing, or edema Skin:  . No rashes, lesions, ulcers . palpation of skin: no induration or nodules Neurologic:  . CN 2-12 intact . Sensation all 4 extremities intact Psychiatric:  . Mental status o Mood, affect appropriate o Orientation to person, place, time  . judgment and insight appear intact  I have personally reviewed the following:   Today's Data  . Vitals, CMP, CBC  Imaging  . CXR: Underlying emphysematous change. Apparent loculated left pleural effusion with patchy left base airspace disease. Suspect a degree of underlying interstitial edema.   Scheduled Meds: . acetaZOLAMIDE  250 mg Oral BID  . arformoterol  15 mcg Nebulization BID  . budesonide (PULMICORT) nebulizer solution  0.25 mg Nebulization BID  . chlorhexidine gluconate (MEDLINE KIT)  15 mL Mouth Rinse BID  . enoxaparin (LOVENOX) injection  40 mg Subcutaneous Q24H  . fluticasone  1 spray Each Nare Daily  . furosemide  40 mg Oral Daily  . guaiFENesin  600 mg Oral BID  . insulin aspart  0-15 Units Subcutaneous TID WC  . insulin glargine  20  Units Subcutaneous QHS  . insulin glargine  25 Units Subcutaneous STAT  . ipratropium-albuterol  3 mL Nebulization QID  . iron polysaccharides  150 mg Oral Daily  . methylPREDNISolone (SOLU-MEDROL) injection  60 mg Intravenous Q12H  . pantoprazole  40 mg Oral Daily  . PARoxetine  40 mg Oral q morning - 10a  . umeclidinium bromide  1 puff  Inhalation Daily   Continuous Infusions: . sodium chloride 500 mL (01/21/19 2220)    Active Problems:   COPD (chronic obstructive pulmonary disease) (HCC)   Chronic respiratory failure (HCC)   COPD exacerbation (HCC)   LOS: 5 days   A & P  Acute on Chronic Hypoxic and Hypercapnic respiratory failure: This isdue to COPD exacerbation and bronchiectasis possible concomitant acute on chronic diastolic CHF exacerbation, improving slowly The patient was admitted to the progressive unit inpatient given her use of BiPAP. She had to be placed on a nonrebreather and she desaturated down to 70% when it was taken off; she has been weaned off of it and is on 8 to 9 L of high flow nasal cannula today. She is saturating in the high nineties on 8-9 L of high flow O2. She wears 6L O2 by nasal cannula at baseline for saturations in the low to mid 90's. O2 will be titrated to  91 to 93%.COVID-19 negative. Pt will need BIPAP for sleep upon discharge to home. PCCM has bveen consulted and has consulted palliative care in turn as they believe that the patient's respiratory situation is in a terminal decline. Palliative care has had a discussion with the patient and her family. She is in agreement with discharge to home with hospice when she has reached maximal benefit from her inpatient stay. She is currently receiving oral doxycycline, Brovana, albuterol, pulmicort, incruse ellipta, and duonebs. She is receiving Iv solumedrol bid. Will continue to wean this. She is also receiving IV Lasix, acetazolamide. Respiratory Virus panel is negative as is COVID-19 testing.  Poorly Controlled Diabetes Mellitus Type 2: This patient is receiving Lantus 20 units daily, and she is on a resistant sliding scale. Glucoses are very difficult to control  We will reduce home Lantus dosing from 25 units to 20 units. The patient's glucoses have run from 287 to 423 today. This is likely due to steroids. This has recently been deescalated to  60 mg bid. Continue to wean as possible. Will increase dose of lantus. Recent hemoglobin A1c done on 11/02/2018 was 9.4. Diabetes education coordinator has been consulted for further evaluation recommendations.  Chronic Microcytic Anemia: Pt's hemoglobin initially dropped from 11.9 to 8.6.. Today hemoglobin is stable at 9.1. Anemia is multifactorial including iron deficiency, and anemia of chronic disease. Monitor. FOBT is pending.   Acute on Chronic Diastolic CHF: The patient has been receiving lasix for volume overload. She is currently 3.876 L negative. Volume status, electrolytes, and creatinine will be monitored.   Pulmonary nodules,bilateral lower and mediastinal lymphadenosis/mass: Uunknown etiology,according to Pulm/Critical Care note in October; he is Dr. Fuller Plan. 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 She is currently maintained on Trelegy Ellipta and 20 mg of p.o. prednisone daily. PCCM recommends hospice as the patient is felt to be terminal.  Hypernatremia: Resolved.   Hypokalemia: Supplement and monitor.   Deconditioning: PT/OT is recommending SNF, but she will likely discharge to home with hospice.   Transaminitis: Resolved. Monitor.   Leukocytosis: Continued and likely secondary to steroids. Monitor. 12.2  today.  Anxiety/Generalized Anxiety Disorder: Continue with anxiolytics at the reduced dose with alprazolam 0.25 mg p.o. 3 times daily as needed for anxiety  Obesity: BM I is 37.95. Complicates all cares.  I have seen and examined this patient myself. I have spent 36 minutes in her evaluation and care.  DVT prophylaxis: Enoxaparin 40 mg sq q24h Code Status: Partial Code : DNI Family Communication: No family present at bedside  Disposition Plan: Pt to discharge to home when maximum benefit of inpatient stay is attained.   Jefferie Holston, DO [facility] Direct contact: see  [email or web address]  7PM-7AM contact night coverage as above 01/25/2019, 1:36 PM  LOS: 5 days

## 2019-01-25 NOTE — Progress Notes (Signed)
Dr. Elesa Massed made aware again through [facility] system that pt CBG 423 will monitor patient .Hal Norrington, Bettina Gavia RN

## 2019-01-25 NOTE — Care Management Important Message (Signed)
Important Message  Patient Details  Name: [name] MRN: [id number] Date of Birth: 17-Jun-1945   Medicare Important Message Given:  Yes     Shelda Altes 01/25/2019, 2:01 PM

## 2019-01-25 NOTE — Progress Notes (Signed)
[facility])  Referral received for hospice services at home once discharged.  Pt is currently under our palliative program and well known to [facility].  She has frequently refused hospice services in the past and may be hesitant now.    Attempted to call, not able to leave message.  Will continue to try to connect and discuss hospice further prior to her leaving the hospital.    Should she continue to refuse, [facility] will follow closely when she discharges.  Venia Carbon RN, BSN, CCRN

## 2019-01-25 NOTE — Progress Notes (Signed)
NAMEJennavie [name], MRN:  [id number], DOB:  May 14, 1945, LOS: 5 ADMISSION DATE:  01/20/2019, CONSULTATION DATE:  01/21/2019 REFERRING MD:  Dr Alfredia Ferguson triad, CHIEF COMPLAINT:  Worsening resp failure - unable to come off biopap   Brief History    74 year old African-American female with advanced COPD with remote history of mediastinal mass considered high risk surgical candidate and then followed up clinically with the last CT scan in 2018 without much enlargement..  Well-known to Dr. [name] in the outpatient clinic but last seen by him in October 2019.  At that point in time in October 2019 she was on home palliative care with chronic prednisone 20 mg/day and Trelegy inhaler and 5 L of oxygen with continued ongoing smoking.  She was dependent on her nephew for her groceries.  She had a history of recurrent exacerbations.  Her baseline CAT score was 27 and in 2018 she did not have hypercapnia and FEV1 was around 53%.  She had televisit on mid December 2020 with increased shortness of breath and complains that her pulse ox was dropping into the 70s even with minimal exertion and she was completely homebound because of this.  Xanax was helping.  [Normally she saturates in the 80s].  Increase in oxygen to nearly 7 L was recommended and a prednisone taper.  Along with doxycycline.  She had a follow-up televisit on January 13, 2019.  It was felt that she was improving.  She then got admitted on January 20, 2019 with worsening shortness of breath with ABG showing hypoxemia and hypercapnia and started on BiPAP, steroids systemic and bronchodilator.  She is been unable to come off BiPAP.  She insist on coming off BiPAP but she desaturates even though she is feeling okay.  She has class IV dyspnea.  Nurse is extremely distressed at the level of dyspnea coming off the BiPAP.  She is asking to eat.  Given the complete dependency on BiPAP and class IV dyspnea with severe hypoxemia pulmonary has been  consulted  Significant Hospital Events   01/20/2019 - admit  Consults:  01/21/2019 - pulm  Procedures:  bipap  Significant Diagnostic Tests:  x  Micro Data:  x  Antimicrobials:  x   Interim history/subjective:   Patient states she is breathing better today.  Able to decrease oxygen to 8 L nasal cannula.  Objective   Blood pressure (!) 104/53, pulse 84, temperature 98.3 F (36.8 C), temperature source Oral, resp. rate 13, height 5\' 1"  (1.549 m), weight 91.1 kg, SpO2 96 %.    FiO2 (%):  [40 %] 40 %   Intake/Output Summary (Last 24 hours) at 01/25/2019 1027 Last data filed at 01/25/2019 0545 Gross per 24 hour  Intake 350 ml  Output 650 ml  Net -300 ml   Filed Weights   01/23/19 1148  Weight: 91.1 kg    General appearance: 74 y.o., female, NAD, conversant  Eyes: anicteric sclerae, moist conjunctivae; no lid-lag; PERRLA, tracking appropriately HENT: NCAT; oropharynx, MMM, no mucosal ulcerations; normal hard and soft palate Neck: Large neck Lungs: Diminished breath sounds bilaterally no crackles no wheeze CV: RRR, S1, S2, no MRGs  Abdomen: Soft, non-tender; non-distended, BS present, large obese pannus Extremities: No peripheral edema, radial and DP pulses present bilaterally  Skin: Normal temperature, turgor and texture; no rash Psych: Appropriate affect Neuro: Alert and oriented to person and place, no focal deficit   Resolved Hospital Problem list   X  Assessment & Plan:   Acute on  chronic hypoxemic and hypercapnic respiratory failure -at baseline both stage IV COPD with chronic prednisone 5 L hypoxemia. RVP negative Immunosuppressed state chronic prednisone use Recurrent aspiration into airway, DNR, continues to eat p.o.  Plan -Continue O2 support to maintain sats greater than 86%. -BiPAP nightly -BiPAP as needed with naps -Continue scheduled bronchodilation. -Taper steroids over the next 2 to 3 weeks and continue maintenance dose of 10 mg daily. -I  believe that the patient would benefit most from discharge with home hospice care. -I have discussed this with palliative care.  We appreciate their input.  Pulmonary will see as needed.  Does appear that she is improving to the point where could consider discharge with home hospice care.  Please do not hesitate to call with any questions.  Best practice:  Diet: DNR however continues to eat. Pain/Anxiety/Delirium protocol (if indicated): X VAP protocol (if indicated): Head of bed greater than 30 degrees DVT prophylaxis: According to the hospitalist GI prophylaxis: According to the hospitalist Glucose control: Needs sliding scale insulin but managed by the hospitalist Mobility: Bedrest Code Status: Partial code  Family Communication: Patient  disposition: Remain in [place]  Lab 01/20/19 1115 01/25/19 0447  PHART  --  7.351  PCO2ART  --  49.5*  PO2ART  --  62.2*  HCO3 35.5* 26.8  TCO2 38*  --   O2SAT 95.0 90.1    CBC Recent Labs  Lab 01/23/19 0234 01/24/19 0449 01/25/19 0355  HGB 8.5* 8.7* 9.1*  HCT 33.7* 35.0* 37.0  WBC 10.2 9.5 12.2*  PLT 247 255 264    COAGULATION No results for input(s): INR in the last 168 hours.  CARDIAC  No results for input(s): TROPONINI in the last 168 hours. No results for input(s): PROBNP in the last 168 hours.   CHEMISTRY Recent Labs  Lab 01/21/19 0306 01/22/19 0302 01/23/19 0234 01/24/19 0449 01/25/19 0355  NA 145 142 141 141 141  K 4.8 3.8 3.8 3.8 3.4*  CL 107 102 103 106 105  CO2 30 30 27 26 27   GLUCOSE 264* 243* 200* 283* 228*  BUN 13 17 22  25* 24*  CREATININE 0.95 0.86 0.84 0.80 0.87  CALCIUM 8.8* 8.9 9.0 9.0 8.8*  MG  --  2.5* 2.4 2.3 2.3  PHOS  --  3.0 2.9 3.3 3.1   Estimated Creatinine Clearance: 61 mL/min (by C-G formula based on SCr of 0.87 mg/dL).   LIVER Recent Labs  Lab 01/20/19 1024 01/22/19 0302 01/23/19 0234 01/24/19 0449 01/25/19 0355  AST 56* 29 16 22  13*  ALT  50* 56* 44 38 32  ALKPHOS 60 54 52 55 54  BILITOT 0.3 0.3 0.6 0.5 0.2*  PROT 5.7* 5.8* 5.7* 5.7* 5.9*  ALBUMIN 3.1* 3.1* 3.2* 3.1* 3.2*     INFECTIOUS No results for input(s): LATICACIDVEN, PROCALCITON in the last 168 hours.   ENDOCRINE CBG (last 3)  Recent Labs    01/24/19 1653 01/24/19 2132 01/25/19 0635  GLUCAP 287* 213* 248*    IMAGING x48h  - image(s) personally visualized  -   highlighted in bold DG CHEST PORT 1 VIEW  Result Date: 01/25/2019 CLINICAL DATA:  Shortness of breath EXAM: PORTABLE CHEST 1 VIEW COMPARISON:  January 24, 2019. FINDINGS: There is loculated left pleural effusion with patchy airspace opacity in the left base, stable. There is underlying bullous disease in the upper lobes. There is interstitial thickening elsewhere which in part is due to  redistribution of blood flow to viable lung segments. There is suspected degree of interstitial edema as well. There is cardiomegaly. The pulmonary vascular is stable and reflects a underlying emphysema. There is aortic atherosclerosis. No adenopathy. No bone lesions. IMPRESSION: Underlying emphysematous change. Apparent loculated left pleural effusion with patchy left base airspace disease. Suspect a degree of underlying interstitial edema. Stable cardiomegaly. No new opacity evident. Aortic Atherosclerosis (ICD10-I70.0). Electronically Signed   By: Lowella Grip III M.D.   On: 01/25/2019 07:40   DG CHEST PORT 1 VIEW  Result Date: 01/24/2019 CLINICAL DATA:  Shortness of breath EXAM: PORTABLE CHEST 1 VIEW COMPARISON:  01/23/2019 FINDINGS: Stable cardiomegaly with central vascular congestion and persistent mid and lower lung airspace opacities versus pneumonia. Small effusions remain, larger on the left. Background emphysema noted with parenchymal scarring in the upper lobes. No pneumothorax. Trachea is midline. Aorta atherosclerotic and tortuous. IMPRESSION: Stable COPD/emphysema pattern with bibasilar airspace  disease/atelectasis and small effusions. No significant interval change. Electronically Signed   By: Jerilynn Mages.  Shick M.D.   On: 01/24/2019 09:17    Garner Nash, DO [facility] 01/25/2019 10:27 AM

## 2019-01-25 NOTE — Consult Note (Signed)
   [facility] CM Inpatient Consult   01/25/2019  Tanda Brautigan [birth year] [id number]  Alerted by Darlene, [place] RN Care Coordinator of hospitalization on 01/21/2019.  Patient is currently active with [facility] for chronic disease management services.  Patient is noted to have extreme high risk score for unplanned readmissions.  This is the patient's 3rd admission in 6 months noted in [place] [facility].  Patient has been engaged by a [facility].  Our community based plan of care has focused on disease management and community resource support.   Noted a Palliative consult in process.  Patient admitted with COPD exacerbation. Patient has been active with [facility] for home palliative program prior to admission.  Plan: Follow for progress and needs.  Review and follow up Inpatient Transition Of Care [TOC] team member to make aware that [facility] following.   Of note, [facility] Care Management services does not replace or interfere with any services that are needed or arranged by inpatient [facility] care management team.  For additional questions or referrals please contact:  Natividad Brood, RN BSN [place] Liaison  [phone] business mobile phone Toll free office [phone]  Fax number: [phone] [email or web address]

## 2019-01-25 NOTE — Progress Notes (Signed)
Physical Therapy Treatment Patient Details Name: [name] MRN: [id number] DOB: 19-Apr-1945 Today's Date: 01/25/2019    History of Present Illness 74 y.o. female with medical history significant of COPD baseline 6 L of oxygen at all times, bronchiectasis, diastolic CHF, HLD, HTN, mediastinal mass, CT, pulmonary nodules, Tobacco abuse psoriasis, GERD, diabetes, presented with increasing short of breath since yesterday, with worsening of wheezing and occasional dry cough.    PT Comments    Patient progressing slowly towards PT goals. More alert and participatory today but very confused. Tolerated standing and taking a few steps along side bed with RW and min A for balance. Limited due to fatigue, dyspnea on exertion and decreased activity tolerance. Sp02 dropped to 78% on 8 L HF [place] with minimal activity. Pt might be discharging home with hospice care per notes. Will follow to assess for DME needs.   Follow Up Recommendations  SNF;Supervision for mobility/OOB (vs home with hospice)     Equipment Recommendations  Other (comment)(defer)    Recommendations for Other Services       Precautions / Restrictions Precautions Precautions: Fall Precaution Comments: watch 02 Restrictions Weight Bearing Restrictions: No    Mobility  Bed Mobility Overal bed mobility: Needs Assistance Bed Mobility: Sidelying to Sit;Sit to Sidelying   Sidelying to sit: Min assist;HOB elevated     Sit to sidelying: HOB elevated;Min assist General bed mobility comments: Pt already laying on left side; able to sit EOB with Min A and increased effort. Some dizziness reported. Assist to bring RLE into bed.  Transfers Overall transfer level: Needs assistance Equipment used: Rolling walker (2 wheeled) Transfers: Sit to/from Stand Sit to Stand: Min assist;+2 safety/equipment         General transfer comment: Assist to power to standing with cues for hand placement/technique. Asking therapist to block RW  wheels.  Ambulation/Gait Ambulation/Gait assistance: Min assist;+2 safety/equipment Gait Distance (Feet): 4 Feet Assistive device: Rolling walker (2 wheeled) Gait Pattern/deviations: Trunk flexed;Shuffle Gait velocity: decreased   General Gait Details: Able to side step along side bed with Min A and RW for balance; 3/4 DOE. Sp02 dropped to 78% on 8L HF [place].   Stairs             Wheelchair Mobility    Modified Rankin (Stroke Patients Only)       Balance Overall balance assessment: Needs assistance Sitting-balance support: Feet supported;Bilateral upper extremity supported Sitting balance-Leahy Scale: Fair Sitting balance - Comments: Close min guard for safety.   Standing balance support: During functional activity Standing balance-Leahy Scale: Poor Standing balance comment: Requires UE support in standing.                            Cognition Arousal/Alertness: Awake/alert Behavior During Therapy: Flat affect Overall Cognitive Status: Impaired/Different from baseline Area of Impairment: Orientation;Attention;Memory;Following commands;Safety/judgement;Awareness;Problem solving                 Orientation Level: Disoriented to;Place;Time;Situation Current Attention Level: Sustained Memory: Decreased short-term memory Following Commands: Follows one step commands with increased time Safety/Judgement: Decreased awareness of safety;Decreased awareness of deficits Awareness: Intellectual   General Comments: Pt highly confused; telling this PT she is not in the hospital but at someone else's house which looks just like hers; "esp the kitchen and dining room area," "i been trying to get my cashews and chips from out there." When moving bed over, pt asking "where is the tube?" referring to the TV, "  you turned me all around." Pt tangential in speech and not making much sense and then getting mad when corrected. But then able to state correct name of critical  care doc saying he came by earlier.      Exercises      General Comments General comments (skin integrity, edema, etc.): Sp02 dropped to 78% on 8L HF [place] during activity. Fatigues very quickly.      Pertinent Vitals/Pain Pain Assessment: No/denies pain    Home Living                      Prior Function            PT Goals (current goals can now be found in the care plan section) Progress towards PT goals: Progressing toward goals(slowly)    Frequency    Min 2X/week      PT Plan Current plan remains appropriate    Co-evaluation              AM-PAC PT "6 Clicks" Mobility   Outcome Measure  Help needed turning from your back to your side while in a flat bed without using bedrails?: A Little Help needed moving from lying on your back to sitting on the side of a flat bed without using bedrails?: A Little Help needed moving to and from a bed to a chair (including a wheelchair)?: A Little Help needed standing up from a chair using your arms (e.g., wheelchair or bedside chair)?: A Little Help needed to walk in hospital room?: A Lot Help needed climbing 3-5 steps with a railing? : Total 6 Click Score: 15    End of Session Equipment Utilized During Treatment: Oxygen(8L HF [place]) Activity Tolerance: Patient limited by fatigue Patient left: in bed;with call bell/phone within reach;with bed alarm set Nurse Communication: Mobility status PT Visit Diagnosis: Muscle weakness (generalized) (M62.81)     Time: [id number] PT Time Calculation (min) (ACUTE ONLY): 20 min  Charges:  $Therapeutic Activity: 8-22 mins                     Marisa Severin, PT, DPT Acute Rehabilitation Services Pager [phone] Office [phone]       Marguarite Arbour A Sabra Heck 01/25/2019, 3:55 PM

## 2019-01-25 NOTE — Progress Notes (Addendum)
Upon entering room Patient resting comfortably on 10 L high flow [place], sats low to mid 90s, patient lying on left side. Patient bed needing to be changed. Upon rolling patient became very short of breath with labored breathing. patient stating" she cannot breathe" patient then placed back on left side and purewick placed and then patients breathing  unlabored at this time. sats on 10L HFNC at 95-97% will monitor patient. Lenzie Montesano, Bettina Gavia RN

## 2019-01-25 NOTE — Progress Notes (Signed)
Patient with CBG 423, Dr. Benny Lennert made aware through [facility] system will monitor patient. [name], Bettina Gavia RN

## 2019-01-25 NOTE — Consult Note (Signed)
Consultation Note Date: 01/25/2019   Patient Name: [name]  DOB: 1945-04-26  MRN: [id number]  Age / Sex: 74 y.o., female  PCP: Binnie Rail, MD Referring Physician: Karie Kirks, DO  Reason for Consultation: Establishing goals of care and Psychosocial/spiritual support  HPI/Patient Profile: 74 y.o. female   admitted on 01/20/2019 with past medical history significant of COPD baseline 6 L of oxygen at all times, bronchiectasis, diastolic CHF, HLD, HTN, mediastinal mass, CT/ mediastinal mass considered high risk surgical candidate and then followed up clinically with the last CT scan in 2018 without much enlargement.. , pulmonary nodules, Tobacco abuse, psoriasis, GERD, diabetes, presented with increasing short of breath since yesterday, with worsening of wheezing and occasional dry cough no fevers no chills no chest pain no leg edema she denied any Covid contact.  Covid-19 negative  ED Course: ABG showed mixed hypoxia and hypercapnia  This is her third hospital admission in the last 6 months  Patient lives at home alone with minimal social support.  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  Clinical Assessment and Goals of Care:  This NP Wadie Lessen reviewed medical records, received report from team, assessed the patient and then meet at the patient's bedside  to discuss diagnosis, prognosis, GOC, EOL wishes disposition and options.  Concept of Hospice and Palliative Care were discussed  A detailed discussion was had today regarding advanced directives.  Concepts specific to code status, artifical  feeding and hydration, continued IV antibiotics and rehospitalization was had.  The difference between a aggressive medical intervention path  and a palliative comfort care path for this patient at this time was had.  Values and goals of care important to patient and family were attempted to be elicited.  MOST form introduced    Questions and concerns addressed.   Family encouraged to call with questions or concerns.    PMT will continue to support holistically.    No documented healthcare power of attorney.  The patient's son [name] is listed as first contact the patient tells me they have very little contact      SUMMARY OF RECOMMENDATIONS    Patient understands the seriousness of her current medical situation and the likely long-term poor prognosis.  -Patient wants to continue to treat the treatable and hope for continued life -Patient is agreeable to utilization of BiPAP at night -She is open to hospice services with an understanding that patient is eligible when the prognosis is less than 6 months  Code Status/Advance Care Planning:  Limited code/no intubation   Palliative Prophylaxis:   Aspiration, Bowel Regimen, Delirium Protocol, Frequent Pain Assessment and Oral Care  Additional Recommendations (Limitations, Scope, Preferences):  Avoid Hospitalization  Psycho-social/Spiritual:   Desire for further Chaplaincy support:yes  Additional Recommendations: Education  on Hospice  Prognosis:   < 6 months  Discharge Planning: Patient is open to hospice services.  She has been seen by outpatient community-based palliative.  She has been resistant to shift to hospice worried that she would lose her 8 hours of CNA support.  However I spoke to [facility]/the agency that is providing the services and they tell me that she can have her 8 hours of CNA support and hospice without any conflict     Home with Hospice      Primary Diagnoses: Present on Admission: .  COPD exacerbation ([place]) . Chronic respiratory failure ([place]) . COPD (chronic obstructive pulmonary disease) ([place])   I have reviewed the medical record, interviewed the patient and family, and examined the patient. The following aspects are pertinent.  Past Medical History:  Diagnosis Date  . Arrhythmia   . Bronchiectasis march 2012   On CT chest. RUL. Mild  . CHF (congestive heart failure) ([place])   . Chicken pox   . Chronic diastolic heart failure ([place])   . Chronic respiratory failure ([place])    Followed in Pulmonary clinic/ [facility]/ [facility]  - 06/30/2012 desat to 86%  RA walking 50 ft, recovered to 90% at rest - 06/30/2012  Walked 1lpm x 3 laps @ 185 ft each stopped due to  Sob, no desat  rec 02 2lpm with activity and sleeping, ok at rest   . COPD (chronic obstructive pulmonary disease) ([place])     FEV-1 in 2008 was 63% with a diffusion capacity of 33%.   Marland Kitchen Dyspnea   . Generalized anxiety disorder   . History of cervical cancer 1982  . Hyperlipidemia   . Hypertension   . Leg swelling    Venous doppler right 07/21/12 >>Neg    . 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 Medical history non-contributory   . Medical non-compliance   . MITRAL VALVE PROLAPSE   . Obesity, unspecified   . Pulmonary nodule 03/2010   62m RUL and RLL 1st seen march 2012 CT, ?progression 12/2012 CT  . Seasonal allergies   . Tobacco abuse     Smokes one pack a day since age 74   Social History   Socioeconomic History  . Marital status: Divorced    Spouse name: Not on file  . Number of children: 0  . Years of education: Not on file  . Highest education level: Not on file  Occupational History  . Occupation: retired  Tobacco Use  . Smoking status: Current Every Day Smoker    Packs/day: 0.25    Years: 50.00    Pack years: 12.50    Types: Cigarettes  . Smokeless tobacco: Never Used  . Tobacco comment: 3-4 cigaretters per day 11/20/2017   Substance and  Sexual Activity  . Alcohol use: Yes    Alcohol/week: 0.0 standard drinks    Comment: occasional  . Drug use: No  . Sexual activity: Not Currently  Other Topics Concern  . Not on file  Social History Narrative   Married but recently separated from husband   Social Determinants of Health   Financial Resource Strain: Low Risk   . Difficulty of Paying Living Expenses: Not very hard  Food Insecurity: No Food Insecurity  . Worried About RCharity fundraiserin the Last Year: Never true  . Ran Out of Food in the Last Year: Never true  Transportation Needs: Unmet Transportation Needs  .  Lack of Transportation (Medical): Yes  . Lack of Transportation (Non-Medical): No  Physical Activity: Inactive  . Days of Exercise per Week: 0 days  . Minutes of Exercise per Session: 0 min  Stress: Stress Concern Present  . Feeling of Stress : To some extent  Social Connections: Unknown  . Frequency of Communication with Friends and Family: Never  . Frequency of Social Gatherings with Friends and Family: Never  . Attends Religious Services: Not on file  . Active Member of Clubs or Organizations: Not on file  . Attends Archivist Meetings: Not on file  . Marital Status: Divorced   Family History  Problem Relation Age of Onset  . Other Father        MVA  . Esophageal cancer Mother    Scheduled Meds: . acetaZOLAMIDE  250 mg Oral BID  . arformoterol  15 mcg Nebulization BID  . budesonide (PULMICORT) nebulizer solution  0.25 mg Nebulization BID  . chlorhexidine gluconate (MEDLINE KIT)  15 mL Mouth Rinse BID  . doxycycline  100 mg Oral Q12H  . enoxaparin (LOVENOX) injection  40 mg Subcutaneous Q24H  . fluticasone  1 spray Each Nare Daily  . furosemide  40 mg Oral Daily  . guaiFENesin  600 mg Oral BID  . insulin aspart  0-15 Units Subcutaneous TID WC  . insulin glargine  20 Units Subcutaneous QHS  . ipratropium-albuterol  3 mL Nebulization QID  . iron polysaccharides  150 mg Oral Daily    . methylPREDNISolone (SOLU-MEDROL) injection  60 mg Intravenous Q12H  . pantoprazole  40 mg Oral Daily  . PARoxetine  40 mg Oral q morning - 10a  . umeclidinium bromide  1 puff Inhalation Daily   Continuous Infusions: . sodium chloride 500 mL (01/21/19 2220)   PRN Meds:.sodium chloride, acetaminophen, albuterol, ALPRAZolam Medications Prior to Admission:  Prior to Admission medications   Medication Sig Start Date End Date Taking? Authorizing Provider  acetaZOLAMIDE (DIAMOX) 250 MG tablet Take 1 tablet (250 mg total) by mouth 2 (two) times daily. 09/05/18  Yes Nita Sells, MD  albuterol (PROVENTIL HFA;VENTOLIN HFA) 108 (90 Base) MCG/ACT inhaler Inhale 2 puffs into the lungs every 6 (six) hours as needed for wheezing or shortness of breath. 12/24/17  Yes Brand Males, MD  ALPRAZolam Duanne Moron) 1 MG tablet Take 1 tablet (1 mg total) by mouth 3 (three) times daily as needed for anxiety. 01/04/19  Yes Burns, Claudina Lick, MD  diphenhydramine-acetaminophen (TYLENOL PM) 25-500 MG TABS tablet Take 2 tablets by mouth at bedtime as needed (pain/sleep).   Yes [provider]  fluticasone (FLONASE) 50 MCG/ACT nasal spray SPRAY 2 SPRAYS INTO EACH NOSTRIL EVERY DAY Patient taking differently: Place 2 sprays into both nostrils daily.  01/11/19  Yes Brand Males, MD  Fluticasone-Umeclidin-Vilant (TRELEGY ELLIPTA) 100-62.5-25 MCG/INH AEPB Inhale 1 puff into the lungs daily. 07/16/18  Yes Brand Males, MD  furosemide (LASIX) 40 MG tablet Take 40 mg by mouth daily.    Yes [provider]  GLUCOCOM LANCETS 33G MISC Use with Test Strips to take blood sugars 07/20/15  Yes Burns, Claudina Lick, MD  guaiFENesin (MUCINEX) 600 MG 12 hr tablet Take 1 tablet (600 mg total) by mouth 2 (two) times daily. 01/04/19  Yes Martyn Ehrich, NP  Insulin Glargine (LANTUS SOLOSTAR) 100 UNIT/ML Solostar Pen Inject 25 Units into the skin at bedtime. DAILY AT 10 PM. Patient taking differently: Inject 32  Units into the skin at bedtime. DAILY AT 10  PM. 11/10/18  Yes Burns, Claudina Lick, MD  Insulin Pen Needle (BD PEN NEEDLE NANO U/F) 32G X 4 MM MISC USE TO ADMINISTER LANTUS INSULIN 05/08/16  Yes Burns, Claudina Lick, MD  pantoprazole (PROTONIX) 40 MG tablet Take 1 tablet (40 mg total) by mouth daily. 01/26/18  Yes Burns, Claudina Lick, MD  PARoxetine (PAXIL) 40 MG tablet TAKE 1 TABLET BY MOUTH EVERY DAY IN THE MORNING. Patient taking differently: Take 40 mg by mouth every morning. TAKE 1 TABLET BY MOUTH EVERY DAY IN THE MORNING. 08/20/18  Yes Burns, Claudina Lick, MD  predniSONE (DELTASONE) 20 MG tablet Take 1 tablet (20 mg total) by mouth daily with breakfast. One daily 12/08/18  Yes Lauraine Rinne, NP   Allergies  Allergen Reactions  . Ambien [Zolpidem Tartrate] Other (See Comments)    Causes nightmares   Review of Systems  Respiratory: Positive for shortness of breath.   Neurological: Positive for weakness.    Physical Exam Cardiovascular:     Rate and Rhythm: Normal rate.  Pulmonary:     Effort: Tachypnea present.  Musculoskeletal:     Comments: Generalized wealness  Skin:    General: Skin is warm and dry.  Neurological:     Mental Status: She is alert.     Vital Signs: BP (!) 95/53 (BP Location: Right Arm)   Pulse 86   Temp 98.3 F (36.8 C) (Oral)   Resp 16   Ht _0  (1.549 m)   Wt 91.1 kg   SpO2 95%   BMI 37.95 kg/m  Pain Scale: 0-10   Pain Score: 0-No pain   SpO2: SpO2: 95 % O2 Device:SpO2: 95 % O2 Flow Rate: .O2 Flow Rate (L/min): 10 L/min  IO: Intake/output summary:   Intake/Output Summary (Last 24 hours) at 01/25/2019 0902 Last data filed at 01/25/2019 0545 Gross per 24 hour  Intake 350 ml  Output 1350 ml  Net -1000 ml    LBM: Last BM Date: 01/20/19 Baseline Weight: Weight: 91.1 kg Most recent weight: Weight: 91.1 kg     Palliative Assessment/Data:  40 %   Informed Dr Benny Lennert via secure chat  Time In: 0800 Time Out: 0915 Time Total: 75 minutes Greater than 50%  of this  time was spent counseling and coordinating care related to the above assessment and plan.  Signed by: Wadie Lessen, NP   Please contact Palliative Medicine Team phone at [phone] for questions and concerns.  For individual provider: See Shea Evans

## 2019-01-26 LAB — CBC WITH DIFFERENTIAL/PLATELET
Abs Immature Granulocytes: 0.06 10*3/uL (ref 0.00–0.07)
Basophils Absolute: 0 10*3/uL (ref 0.0–0.1)
Basophils Relative: 0 %
Eosinophils Absolute: 0 10*3/uL (ref 0.0–0.5)
Eosinophils Relative: 0 %
HCT: 36.9 % (ref 36.0–46.0)
Hemoglobin: 9.3 g/dL — ABNORMAL LOW (ref 12.0–15.0)
Immature Granulocytes: 1 %
Lymphocytes Relative: 6 %
Lymphs Abs: 0.8 10*3/uL (ref 0.7–4.0)
MCH: 18.7 pg — ABNORMAL LOW (ref 26.0–34.0)
MCHC: 25.2 g/dL — ABNORMAL LOW (ref 30.0–36.0)
MCV: 74.2 fL — ABNORMAL LOW (ref 80.0–100.0)
Monocytes Absolute: 0.9 10*3/uL (ref 0.1–1.0)
Monocytes Relative: 7 %
Neutro Abs: 10.6 10*3/uL — ABNORMAL HIGH (ref 1.7–7.7)
Neutrophils Relative %: 86 %
Platelets: 288 10*3/uL (ref 150–400)
RBC: 4.97 MIL/uL (ref 3.87–5.11)
RDW: 19.9 % — ABNORMAL HIGH (ref 11.5–15.5)
WBC: 12.3 10*3/uL — ABNORMAL HIGH (ref 4.0–10.5)
nRBC: 0.4 % — ABNORMAL HIGH (ref 0.0–0.2)

## 2019-01-26 LAB — GLUCOSE, CAPILLARY
Glucose-Capillary: 277 mg/dL — ABNORMAL HIGH (ref 70–99)
Glucose-Capillary: 220 mg/dL — ABNORMAL HIGH (ref 70–99)
Glucose-Capillary: 241 mg/dL — ABNORMAL HIGH (ref 70–99)
Glucose-Capillary: 184 mg/dL — ABNORMAL HIGH (ref 70–99)
Glucose-Capillary: 317 mg/dL — ABNORMAL HIGH (ref 70–99)

## 2019-01-26 LAB — COMPREHENSIVE METABOLIC PANEL
ALT: 29 U/L (ref 0–44)
AST: 10 U/L — ABNORMAL LOW (ref 15–41)
Albumin: 3.1 g/dL — ABNORMAL LOW (ref 3.5–5.0)
Alkaline Phosphatase: 56 U/L (ref 38–126)
Anion gap: 8 (ref 5–15)
BUN: 27 mg/dL — ABNORMAL HIGH (ref 8–23)
CO2: 26 mmol/L (ref 22–32)
Calcium: 9.1 mg/dL (ref 8.9–10.3)
Chloride: 108 mmol/L (ref 98–111)
Creatinine, Ser: 0.96 mg/dL (ref 0.44–1.00)
GFR calc Af Amer: >60 mL/min (ref >60)
GFR calc non Af Amer: 60 mL/min — ABNORMAL LOW (ref >60)
Glucose, Bld: 294 mg/dL — ABNORMAL HIGH (ref 70–99)
Potassium: 3.9 mmol/L (ref 3.5–5.1)
Sodium: 142 mmol/L (ref 135–145)
Total Bilirubin: 0.5 mg/dL (ref 0.3–1.2)
Total Protein: 5.7 g/dL — ABNORMAL LOW (ref 6.5–8.1)

## 2019-01-26 NOTE — Progress Notes (Signed)
Patient called out stating she could not breathe, upon entering room patient visibly short of breath with increased work of breathing, sitting straight up in bed unable to speak sats 78-84 on 4L [place], oxygen increased to 7L HFNC, and after a few minutes patient oxygen increased to 90-92 on the [facility].  Patient now resting on left side sats are 92 and patient able to have a conversation breathing has calmed. Will monitor patient. Nevaeha Finerty, Bettina Gavia RN

## 2019-01-26 NOTE — Progress Notes (Signed)
Manufacturing engineer [facility])  Spoke with son [name] who lives in [place], and sister Hauser.  Perlean lives locally and is not her actual sister but close friend.  She states she cannot help her out anymore as the pt has not been willing to help herself.    Will attempt to see if pt will be able to remain at home with her current level of support in light of her lack of support.  Will update TOC manager.  Venia Carbon RN, BSN, [facility] Liaison

## 2019-01-26 NOTE — Progress Notes (Signed)
Pt awake and requesting to be taken off of BIPAP.  Pt on 9L HFNC. Pt tolerating well with O2 sat 100%. Pt denies needs at this time.  Arletta Bale, RN

## 2019-01-26 NOTE — Progress Notes (Signed)
Inpatient Diabetes Program Recommendations  AACE/ADA: New Consensus Statement on Inpatient Glycemic Control (2015)  Target Ranges:  Prepandial:   less than 140 mg/dL      Peak postprandial:   less than 180 mg/dL (1-2 hours)      Critically ill patients:  140 - 180 mg/dL   Lab Results  Component Value Date   GLUCAP 220 (H) 01/26/2019   HGBA1C 9.4 (H) 11/02/2018    Review of Glycemic Control Results for [name], [name] (MRN [id number]) as of 01/26/2019 09:59  Ref. Range 01/25/2019 06:35 01/25/2019 11:54 01/25/2019 14:01 01/25/2019 16:26 01/25/2019 22:00 01/26/2019 06:16  Glucose-Capillary Latest Ref Range: 70 - 99 mg/dL 248 (H) 423 (H) 403 (H) 317 (H) 277 (H) 220 (H)   Diabetes history: DM 2 Outpatient Diabetes medications: Lantus 32 units qhs Current orders for Inpatient glycemic control:  Lantus 20 units qhs Novolog 0-15 units tid  Solumedrol 60 mg Q12 hours   Inpatient Diabetes Program Recommendations:    Patient received a total of 45 units of Lantus yesterday and glucose is still elevated this am on Solumedrol 60 mg Q12 hours. Postprandial glucose trends  increases into the 400's.   -Consider Lantus 25 units bid. -Add Novolog 6 units tid meal coverage if eating >50% of meals.  Sent message to Dr. Benny Lennert to discuss plan of care.  Thanks,  Tama Headings RN, MSN, BC-ADM Inpatient Diabetes Coordinator Team Pager [phone] (8a-5p)

## 2019-01-26 NOTE — Progress Notes (Signed)
PROGRESS NOTE  [name] TDS:[id number] DOB: 1945/02/06 DOA: 01/20/2019 PCP: Binnie Rail, MD  Brief History   [name] a 74 y.o.femalewith medical history significant ofCOPDbaseline6 L of oxygen at all times, bronchiectasis, diastolic CHF, HLD, HTN, mediastinal mass, CT, pulmonary nodules, Tobacco abuse psoriasis, GERD, diabetes, presented withincreasing short of breathsince yesterday,with worsening of wheezing and occasional drycough no fevers no chills no chest pain no leg edema shedenied any Covid contact.Denied any muscle ache night sweat,dysuria diarrhea.ABG showed mixed hypoxia and hypercapnia,received Solu-Medrol and bronchodilator.  The patient required BIPAP and then was transitioned to high flow nasal cannula . PCCM was consulted. The patient has advanced emphysema and stable scattered pulmonoary nodules in the right middle and upper lobes. 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 Dr. [name] believes that the patient's pulmonary function is in terminal decline. Recommendation is to continue BIPAP as necessary for now and to treat symptoms with opioids and anxiolytics. Palliative care has been consulted. The patient is agreeable to discharge to home with hospice when she has obtained the maximal benefit of her inpatient stay. She continues to require BIPAP intermittently. Her oxygen requirements today are 8-10 L of heated high flow O2 by nasal cannula.  The patient is under palliative care services, and they have been asked again to investigate the initiation of hospice services for this patient. The patient has refused hospice services in the past.   Consultants  . Palliative Care . PCCM  Procedures  . None  Antibiotics   Anti-infectives (From admission, onward)   Start     Dose/Rate Route Frequency Ordered Stop   01/20/19 1330  doxycycline (VIBRA-TABS) tablet 100 mg     100 mg  Oral Every 12 hours 01/20/19 1325 01/25/19 0959     Subjective  The patient is resting quietly in bed. No new complaints.   Objective   Vitals:  Vitals:   01/26/19 1304 01/26/19 1547  BP:    Pulse:    Resp: 15   Temp:    SpO2: 100% 97%   Exam:  Constitutional:  . The patient is awake, alert, and oriented x 3. No acute distress. Respiratory:  . No increased work of breathing. . No wheezes, rales, or rhonchi . No tactile fremitus Cardiovascular:  . Regular rate and rhythm . No murmurs, ectopy, or gallups. . No lateral PMI. No thrills. Abdomen:  . Abdomen is soft, non-tender, non-distended . No hernias, masses, or organomegaly . Normoactive bowel sounds.  Musculoskeletal:  . No cyanosis, clubbing, or edema Skin:  . No rashes, lesions, ulcers . palpation of skin: no induration or nodules Neurologic:  . CN 2-12 intact . Sensation all 4 extremities intact Psychiatric:  . Mental status o Mood, affect appropriate o Orientation to person, place, time  . judgment and insight appear intact  I have personally reviewed the following:   Today's Data  . Vitals, CMP, CBC  Imaging  . CXR: Underlying emphysematous change. Apparent loculated left pleural effusion with patchy left base airspace disease. Suspect a degree of underlying interstitial edema.   Scheduled Meds: . acetaZOLAMIDE  250 mg Oral BID  . arformoterol  15 mcg Nebulization BID  . budesonide (PULMICORT) nebulizer solution  0.25 mg Nebulization BID  . chlorhexidine gluconate (MEDLINE KIT)  15 mL Mouth Rinse BID  . enoxaparin (LOVENOX) injection  40 mg Subcutaneous Q24H  . fluticasone  1 spray Each Nare Daily  . furosemide  40 mg Oral Daily  . guaiFENesin  600 mg Oral BID  . insulin aspart  0-15 Units Subcutaneous TID WC  . insulin glargine  20 Units Subcutaneous QHS  . ipratropium-albuterol  3 mL Nebulization QID  . iron polysaccharides  150 mg Oral Daily  . methylPREDNISolone (SOLU-MEDROL) injection  60  mg Intravenous Q12H  . pantoprazole  40 mg Oral Daily  . PARoxetine  40 mg Oral q morning - 10a  . umeclidinium bromide  1 puff Inhalation Daily   Continuous Infusions: . sodium chloride 500 mL (01/21/19 2220)    Active Problems:   COPD (chronic obstructive pulmonary disease) (HCC)   Chronic respiratory failure (HCC)   COPD exacerbation (H[facility])   LOS: 6 days   A & P  Acute on Chronic Hypoxic and Hypercapnic respiratory failure: This isdue to COPD exacerbation and bronchiectasis possible concomitant acute on chronic diastolic CHF exacerbation, improving slowly. The patient was admitted to the progressive unit inpatient given her use of BiPAP. She had to be placed on a nonrebreather and she desaturated down to 70% when it was taken off; she has been weaned off of it and is on 8 to 9 L of high flow nasal cannula today. She is saturating in the high nineties on 8-9 L of high flow O2. She wears 6L O2 by nasal cannula at baseline for saturations in the low to mid 90's. O2 will be titrated to  91 to 93%.COVID-19 negative. Pt will need BIPAP for sleep upon discharge to home. PCCM has bveen consulted and has consulted palliative care in turn as they believe that the patient's respiratory situation is in a terminal decline. Palliative care has had a discussion with the patient and her family. She is in agreement with discharge to home with hospice when she has reached maximal benefit from her inpatient stay. She is currently receiving oral doxycycline, Brovana, albuterol, pulmicort, incruse ellipta, and duonebs. She is receiving Iv solumedrol bid. Will continue to wean this. She is also receiving IV Lasix, acetazolamide. Respiratory Virus panel is negative as is COVID-19 testing.  Loculated pleural effusion: Consider VATS, although it seems that, given the patient's comorbidities and state of "terminal decline", as stated by pulmonology, this would not be in the patient's best interests.   Poorly  Controlled Diabetes Mellitus Type 2: This patient is receiving Lantus 20 units daily, and she is on a resistant sliding scale. Glucoses are very difficult to control  We will reduce home Lantus dosing from 25 units to 20 units. The patient's glucoses have run from 287 to 423 today. This is likely due to steroids. This has recently been deescalated to 60 mg bid. Continue to wean as possible. Will increase dose of lantus. Recent hemoglobin A1c done on 11/02/2018 was 9.4. Diabetes education coordinator has been consulted for further evaluation recommendations.  Chronic Microcytic Anemia: Pt's hemoglobin initially dropped from 11.9 to 8.6.. Today hemoglobin is stable at 9.1. Anemia is multifactorial including iron deficiency, and anemia of chronic disease. Monitor. FOBT is pending.   Acute on Chronic Diastolic CHF: The patient has been receiving lasix for volume overload. She is currently 3.876 L negative. Volume status, electrolytes, and creatinine will be monitored.   Pulmonary nodules,bilateral lower and mediastinal lymphadenosis/mass: Uunknown etiology,according to Pulm/Critical Care note in October; he is Dr. Fuller Plan. 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 She is currently maintained on Trelegy Ellipta and 20 mg of p.o. prednisone daily.  PCCM recommends hospice as the patient is felt to be terminal.  Hypernatremia: Resolved.   Hypokalemia: Supplement and monitor.   Deconditioning: PT/OT is recommending SNF, but she will likely discharge to home with hospice.   Transaminitis: Resolved. Monitor.   Leukocytosis: Continued and likely secondary to steroids. Monitor. 12.2 today.  Anxiety/Generalized Anxiety Disorder: Continue with anxiolytics at the reduced dose with alprazolam 0.25 mg p.o. 3 times daily as needed for anxiety  Obesity: BM I is 37.95. Complicates all cares.  I have seen and examined this patient  myself. I have spent 30 minutes in her evaluation and care.  DVT prophylaxis: Enoxaparin 40 mg sq q24h Code Status: Partial Code : DNI Family Communication: No family present at bedside  Disposition Plan: Pt to discharge to home when maximum benefit of inpatient stay is attained. Palliative care in place. Possible discharge to home with hospice.  Ava Swayze, DO [facility] Direct contact: see [email or web address]  7PM-7AM contact night coverage as above 01/26/2019, 4:15 PM  LOS: 5 days

## 2019-01-26 NOTE — Progress Notes (Signed)
Pts order is PRN for BiPAP V60 at this time. Pt respiratory status is stable on HFNC [place] 5lpm w/sats of 100% at this time. RT will continue to monitor.

## 2019-01-26 NOTE — Progress Notes (Addendum)
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  Well-known to Dr. [name] in the outpatient clinic but last seen by him in October 2019.  At that point in time in October 2019 she was on home palliative care with chronic prednisone 20 mg/day and Trelegy inhaler and 5 L of oxygen with continued ongoing smoking.  She was dependent on her nephew for her groceries.  She had a history of recurrent exacerbations.  Her baseline CAT score was 27 and in 2018 she did not have hypercapnia and FEV1 was around 53%.  She had televisit on mid December 2020 with increased shortness of breath and complains that her pulse ox was dropping into the 70s even with minimal exertion and she was completely homebound because of this.  Xanax was helping.  [Normally she saturates in the 80s].  Increase in oxygen to nearly 7 L was recommended and a prednisone taper.  Along with doxycycline.  She had a follow-up televisit on January 13, 2019.  It was felt that she was improving.  She then got admitted on January 20, 2019 with worsening shortness of breath with ABG showing hypoxemia and hypercapnia and started on BiPAP, steroids systemic and bronchodilator.  She is been unable to come off BiPAP.  She insist on coming off BiPAP but she desaturates even though she is feeling okay.  She has class IV dyspnea.  Nurse is extremely distressed at the level of dyspnea coming off the BiPAP.  She is asking to eat.  Given the complete dependency on BiPAP and class IV dyspnea with severe hypoxemia pulmonary has been  consulted  Significant Hospital Events   01/20/2019 - admit  Consults:  01/21/2019 - pulm  Procedures:  bipap  Significant Diagnostic Tests:  x  Micro Data:  x  Antimicrobials:  x   Interim history/subjective:   Patient states that she is breathing even better today.  Still able to taper down on her oxygen requirements.  Still using BiPAP nightly.  She does have a lot of questions about potential need for hospice care at home.  She is amendable to this idea.  Objective   Blood pressure (!) 100/59, pulse 80, temperature 98.1 F (36.7 C), temperature source Axillary, resp. rate 15, height 5\' 1"  (1.549 m), weight 91.1 kg, SpO2 100 %.    FiO2 (%):  [35 %] 35 %   Intake/Output Summary (Last 24 hours) at 01/26/2019 1521 Last data filed at 01/26/2019 1000 Gross per 24 hour  Intake 0 ml  Output 300 ml  Net -300 ml   Filed Weights   01/23/19 1148  Weight: 91.1 kg   General appearance: 74 y.o., female, NAD, conversant  Eyes: anicteric sclerae, moist conjunctivae; no lid-lag; PERRLA, tracking appropriately HENT: NCAT; oropharynx, MMM, no mucosal ulcerations; normal hard and soft palate Neck: Trachea midline; FROM, supple, lymphadenopathy, no JVD Lungs: Diminished breath sounds bilaterally no wheeze CV: RRR, S1, S2 Abdomen: Soft, non-tender; non-distended, BS present, obese pannus Extremities: No peripheral edema, radial and DP pulses present bilaterally  Skin: Normal temperature, turgor and texture; no rash Psych: Appropriate affect  Neuro: Alert and oriented to person and place, no focal deficit    Resolved Hospital Problem list   X  Assessment & Plan:   Acute on chronic hypoxemic and hypercapnic respiratory failure -at baseline both stage IV COPD with chronic prednisone 5 L hypoxemia. RVP negative Immunosuppressed state chronic prednisone use Recurrent aspiration into airway, DNR, continues to eat p.o.  Plan Continue O2 support to maintain sats greater than  88% Continue BiPAP at night Continue scheduled bronchodilators Taper down prednisone to 10 mg daily and maintain there. Patient would benefit from outpatient home hospice care at discharge.  Due to patient's family dynamics patient may benefit from a skilled nursing facility with hospice/palliative care bed.  Pulmonary will sign off at this time.  Agree with home hospice evaluation at discharge.  Appreciate palliative care input.  We will arrange outpatient pulmonary follow-up at discharge.  Best practice:  Diet: DNR however continues to eat. Pain/Anxiety/Delirium protocol (if indicated): X VAP protocol (if indicated): Head of bed greater than 30 degrees DVT prophylaxis: According to the hospitalist GI prophylaxis: According to the hospitalist Glucose control: Needs sliding scale insulin but managed by the hospitalist Mobility: Bedrest Code Status: Partial code  Family Communication: Patient  disposition: Remain in [place]  Lab 01/20/19 1115 01/25/19 0447  PHART  --  7.351  PCO2ART  --  49.5*  PO2ART  --  62.2*  HCO3 35.5* 26.8  TCO2 38*  --   O2SAT 95.0 90.1    CBC Recent Labs  Lab 01/24/19 0449 01/25/19 0355 01/26/19 0233  HGB 8.7* 9.1* 9.3*  HCT 35.0* 37.0 36.9  WBC 9.5 12.2* 12.3*  PLT 255 264 288    COAGULATION No results for input(s): INR in the last 168 hours.  CARDIAC  No results for input(s): TROPONINI in the last 168 hours. No results for input(s): PROBNP in the last 168 hours.   CHEMISTRY Recent Labs  Lab 01/22/19 0302 01/23/19 0234 01/24/19 0449 01/25/19 0355 01/25/19 1249 01/26/19 0233  NA 142 141 141 141  --  142  K 3.8 3.8 3.8 3.4*  --  3.9  CL 102 103 106 105  --  108  CO2 30 27 26 27   --  26  GLUCOSE 243* 200* 283* 228* 424* 294*  BUN 17 22 25* 24*  --  27*  CREATININE 0.86 0.84 0.80 0.87  --  0.96  CALCIUM 8.9 9.0 9.0 8.8*  --  9.1  MG 2.5* 2.4 2.3 2.3  --   --   PHOS 3.0 2.9 3.3 3.1  --   --     Estimated Creatinine Clearance: 55.2 mL/min (by C-G formula based on SCr of 0.96 mg/dL).   LIVER Recent Labs  Lab 01/22/19 0302 01/23/19 0234 01/24/19 0449 01/25/19 0355 01/26/19 0233  AST 29 16 22  13* 10*  ALT 56* 44 38 32 29  ALKPHOS 54 52 55 54 56  BILITOT 0.3 0.6 0.5 0.2* 0.5  PROT 5.8* 5.7* 5.7* 5.9* 5.7*  ALBUMIN 3.1* 3.2* 3.1* 3.2* 3.1*     INFECTIOUS No results for input(s): LATICACIDVEN, PROCALCITON in the last 168 hours.   ENDOCRINE CBG (last 3)  Recent Labs    01/25/19 2200 01/26/19 0616 01/26/19 1111  GLUCAP 277* 220* 241*    IMAGING x48h  - image(s) personally visualized  -   highlighted in bold DG CHEST PORT 1 VIEW  Result Date: 01/25/2019 CLINICAL DATA:  Shortness of  breath EXAM: PORTABLE CHEST 1 VIEW COMPARISON:  January 24, 2019. FINDINGS: There is loculated left pleural effusion with patchy airspace opacity in the left base, stable. There is underlying bullous disease in the upper lobes. There is interstitial thickening elsewhere which in part is due to redistribution of blood flow to viable lung segments. There is suspected degree of interstitial edema as well. There is cardiomegaly. The pulmonary vascular is stable and reflects a underlying emphysema. There is aortic atherosclerosis. No adenopathy. No bone lesions. IMPRESSION: Underlying emphysematous change. Apparent loculated left pleural effusion with patchy left base airspace disease. Suspect a degree of underlying interstitial edema. Stable cardiomegaly. No new opacity evident. Aortic Atherosclerosis (ICD10-I70.0). Electronically Signed   By: Lowella Grip III M.D.   On: 01/25/2019 07:40    Garner Nash, DO [facility] 01/26/2019 3:21 PM

## 2019-01-27 LAB — GLUCOSE, CAPILLARY
Glucose-Capillary: 253 mg/dL — ABNORMAL HIGH (ref 70–99)
Glucose-Capillary: 398 mg/dL — ABNORMAL HIGH (ref 70–99)
Glucose-Capillary: 377 mg/dL — ABNORMAL HIGH (ref 70–99)
Glucose-Capillary: 304 mg/dL — ABNORMAL HIGH (ref 70–99)

## 2019-01-27 MED ORDER — PREDNISONE 20 MG PO TABS
40.0000 mg | ORAL_TABLET | Freq: Two times a day (BID) | ORAL | Status: DC
  Administered 2019-01-27 – 2019-02-02 (×12): 40 mg via ORAL
  Filled 2019-01-27 (×12): qty 2

## 2019-01-27 MED ORDER — INSULIN GLARGINE 100 UNIT/ML ~~LOC~~ SOLN
30.0000 [IU] | Freq: Every day | SUBCUTANEOUS | Status: DC
  Administered 2019-01-27: 30 [IU] via SUBCUTANEOUS
  Filled 2019-01-27 (×2): qty 0.3

## 2019-01-27 NOTE — Progress Notes (Signed)
Nutrition Follow up  DOCUMENTATION CODES:   Not applicable  INTERVENTION:    Continue Magic cup TID with meals, each supplement provides 290 kcal and 9 grams of protein  Continue MVI daily   NUTRITION DIAGNOSIS:   Increased nutrient needs related to chronic illness(severe COPD) as evidenced by estimated needs.  Ongoing  GOAL:   Patient will meet greater than or equal to 90% of their needs   Progressing   MONITOR:   PO intake, Supplement acceptance  REASON FOR ASSESSMENT:   Consult Assessment of nutrition requirement/status, COPD Protocol  ASSESSMENT:   Pt with PMH of Gold stage IV severe COPD on 6L O2 at home, bronchiectasis, CHF, HLD, HTN, mediastinal mass and pulmonary nodules on previous CTs, tobacco abuse, psoriasis, GERD, poorly controlled DM now admitted with increasing SOB and COPD exacerbation.   Pt requiring BiPAP at night and PRN. States appetite is slowly progressing. Meal completions lack documentation. Pt reports eating ~50% of each meal. Had 100% of breakfast this am. Eating Magic Cups TID. Does not wish to have other supplementation.   PMT to meet with pt for possible d/c home with hospice.   I/O: -5,006 ml since admit  UOP: 950 ml x 24 hrs   Medications: 40 mg lasix daily, SS novolog, lantus, solumedrol Labs: CBG [id number]  Diet Order:   Diet Order            Diet Carb Modified Fluid consistency: Thin; Room service appropriate? No; Fluid restriction: 2000 mL Fluid  Diet effective now              EDUCATION NEEDS:   No education needs have been identified at this time  Skin:  Skin Assessment: Skin Integrity Issues: Skin Integrity Issues:: Other (Comment) Other: MASD- groin, breast  Last BM:  1/5  Height:   Ht Readings from Last 1 Encounters:  01/23/19 5\' 1"  (1.549 m)    Weight:   Wt Readings from Last 1 Encounters:  01/27/19 91.6 kg    Ideal Body Weight:  52.2 kg  BMI:  Body mass index is 38.17 kg/m.  Estimated  Nutritional Needs:   Kcal:  1800-2000  Protein:  90-110 grams  Fluid:  >1.8 L/day  Mariana Single RD, LDN Clinical Nutrition Pager # - [phone]

## 2019-01-27 NOTE — Progress Notes (Signed)
Occupational Therapy Treatment Patient Details Name: [name] MRN: [id number] DOB: 1945-12-04 Today's Date: 01/27/2019    History of present illness 74 y.o. female with medical history significant of COPD baseline 6 L of oxygen at all times, bronchiectasis, diastolic CHF, HLD, HTN, mediastinal mass, CT, pulmonary nodules, Tobacco abuse psoriasis, GERD, diabetes, presented with increasing short of breath since yesterday, with worsening of wheezing and occasional dry cough.   OT comments  Patient semi-supine in bed upon arrival, agreeable to OT. Patient supervision level for bed mobility, LB dressing donning socks at EOB and supervision-min guard for chair transfer. Patient requires increased time for all mobility and functional tasks due to desaturation of O2. Patient initially 5L O2 with saturations in low 80s after bed mobility. Pt increased to 6L for remainder of session with mod to max verbal cues for pursed lip breathing techniques. Seated in chair patient sat between high 80s to low 90s on 6L.    Follow Up Recommendations  SNF;Other (comment)(possible home D/C on hospice)    Equipment Recommendations  Other (comment)(defer to next venue)       Precautions / Restrictions Precautions Precautions: Fall Precaution Comments: watch 02 Restrictions Weight Bearing Restrictions: No       Mobility Bed Mobility Overal bed mobility: Modified Independent             General bed mobility comments: patient requires increased time to rest after supine to sit, drop in O2 to low 80s on 5L.  Transfers Overall transfer level: Needs assistance Equipment used: None Transfers: Sit to/from Stand Sit to Stand: Supervision;Min guard              Balance Overall balance assessment: Needs assistance Sitting-balance support: Feet supported Sitting balance-Leahy Scale: Good     Standing balance support: No upper extremity supported;During functional activity Standing balance-Leahy  Scale: Fair Standing balance comment: minG for safety                           ADL either performed or assessed with clinical judgement   ADL Overall ADL's : Needs assistance/impaired                     Lower Body Dressing: Set up;Sitting/lateral leans;Supervision/safety Lower Body Dressing Details (indicate cue type and reason): pt able to don socks seated edge of bed, requires increase time + rest breaks due to drop in O2 with mobility Toilet Transfer: Supervision/safety;BSC;Min Psychiatric nurse Details (indicate cue type and reason): simulated with transfer to recliner, supervision for safety to take few steps from edge of bed. min guard for safety with turning.         Functional mobility during ADLs: Supervision/safety;Min guard;Cueing for safety General ADL Comments: decreased activity tolerance due to O2 saturation with activity               Cognition Arousal/Alertness: Awake/alert Behavior During Therapy: WFL for tasks assessed/performed Overall Cognitive Status: No family/caregiver present to determine baseline cognitive functioning                                 General Comments: RN states patient more alert/oriented compared to yesterday, patient follow all directions appropriately this session              General Comments pt requires max cues for pursed lip breathing throughout session, increased from 5 to 6L, O2  fluctuate between mid 80s to low 90s. pt reports at home she is between 88% and 92% on 6L    Pertinent Vitals/ Pain       Pain Assessment: No/denies pain         Frequency  Min 2X/week        Progress Toward Goals  OT Goals(current goals can now be found in the care plan section)  Progress towards OT goals: Progressing toward goals  Acute Rehab OT Goals Patient Stated Goal: to feel better OT Goal Formulation: With patient Time For Goal Achievement: 02/04/19 Potential to Achieve Goals:  Good ADL Goals Pt Will Perform Grooming: with modified independence;sitting Pt Will Perform Upper Body Bathing: with min assist;sitting Pt Will Perform Upper Body Dressing: with min assist;sitting Pt Will Transfer to Toilet: with modified independence;ambulating;bedside commode Additional ADL Goal #1: Patient will tolerate sitting up for 10 minutes at supervision level while utilizing pursed lip breathing techniques with min cues in order to participate in self care tasks.   Plan Discharge plan remains appropriate       AM-PAC OT "6 Clicks" Daily Activity     Outcome Measure   Help from another person eating meals?: A Little Help from another person taking care of personal grooming?: A Little Help from another person toileting, which includes using toliet, bedpan, or urinal?: A Little Help from another person bathing (including washing, rinsing, drying)?: A Lot Help from another person to put on and taking off regular upper body clothing?: A Little Help from another person to put on and taking off regular lower body clothing?: A Lot 6 Click Score: 16    End of Session Equipment Utilized During Treatment: Oxygen  OT Visit Diagnosis: Muscle weakness (generalized) (M62.81)   Activity Tolerance Patient tolerated treatment well   Patient Left in chair;with call bell/phone within reach;with chair alarm set   Nurse Communication Mobility status        Time: 1116-1140 OT Time Calculation (min): 24 min  Charges: OT General Charges $OT Visit: 1 Visit OT Treatments $Self Care/Home Management : 23-37 mins  Shon Millet OT OT office: [place] 01/27/2019, 1:06 PM

## 2019-01-27 NOTE — Progress Notes (Addendum)
Inpatient Diabetes Program Recommendations  AACE/ADA: New Consensus Statement on Inpatient Glycemic Control (2015)  Target Ranges:  Prepandial:   less than 140 mg/dL      Peak postprandial:   less than 180 mg/dL (1-2 hours)      Critically ill patients:  140 - 180 mg/dL   Lab Results  Component Value Date   GLUCAP 253 (H) 01/27/2019   HGBA1C 9.4 (H) 11/02/2018    Review of Glycemic Control Results for [name], [name] (MRN [id number]) as of 01/26/2019 09:59  Ref. Range 01/25/2019 06:35 01/25/2019 11:54 01/25/2019 14:01 01/25/2019 16:26 01/25/2019 22:00 01/26/2019 06:16  Glucose-Capillary Latest Ref Range: 70 - 99 mg/dL 248 (H) 423 (H) 403 (H) 317 (H) 277 (H) 220 (H)  Results for [name], [name] (MRN [id number]) as of 01/27/2019 10:07  Ref. Range 01/26/2019 06:16 01/26/2019 11:11 01/26/2019 16:36 01/26/2019 21:30 01/27/2019 06:13  Glucose-Capillary Latest Ref Range: 70 - 99 mg/dL 220 (H) 241 (H) 184 (H) 317 (H) 253 (H)   Diabetes history: DM 2 Outpatient Diabetes medications: Lantus 32 units qhs Current orders for Inpatient glycemic control:  Lantus 20 units qhs Novolog 0-15 units tid  Solumedrol 60 mg Q12 hours   Inpatient Diabetes Program Recommendations:    Glucose trends improved  -Consider increasing Lantus 25 units qhs.  - Consider adding Novolog hs scale  -Consider adding Novolog 3 units tid meal coverage if eating >50% of meals.  Thanks,  Tama Headings RN, MSN, BC-ADM Inpatient Diabetes Coordinator Team Pager [phone] (8a-5p)

## 2019-01-27 NOTE — Progress Notes (Signed)
Patient ID: [name], female   DOB: [birth year], 74 y.o.   MRN: [id number]  This NP visited patient at the bedside as a follow up to initial  [place] for palliative medicine needs and emotional support.  Patient is weak and intermittently requiring BiPap.  She is high risk for decompensation 2/2 to underlying co-morbid ites.  Discussed her increased care needs at home and importance of preparing for her needs and safety.  It seems she has little family/freinds support and when talking with them they express frustration in [name]'s choices and appraoch.   We spoke to self responsibility for her own health and wellness and choices and consequences.   Raised awareness  that  she has been offered hospice services in the past and refused.  Reassured her again that she would not  lose her other in home services.    I spoke directly to [name]'s office to confirm.  Patient is defensive "yeah, ok, whatever" turns away, and ends conversation.  Discussed with patient the importance of continued conversation with her medical providers regarding overall plan of care and treatment options,  ensuring decisions are within the context of the patients values and GOCs.  Questions and concerns addressed     PMT will f/u on Monday if still hospitalized.  Total time spent on the unit was 25 minutes  Greater than 50% of the time was spent in counseling and coordination of care  Wadie Lessen NP  Palliative Medicine Team Team Phone # [phone] Pager [phone]

## 2019-01-27 NOTE — Progress Notes (Signed)
PROGRESS NOTE  [name] [id number] DOB: Nov 01, 1945 DOA: 01/20/2019 PCP: Binnie Rail, MD  Brief History   Toni Ndiayeis a 74 y.o.femalewith medical history significant ofCOPDbaseline6 L of oxygen at all times, bronchiectasis, diastolic CHF, HLD, HTN, mediastinal mass, CT, pulmonary nodules, Tobacco abuse psoriasis, GERD, diabetes, presented withincreasing short of breathsince yesterday,with worsening of wheezing and occasional drycough no fevers no chills no chest pain no leg edema shedenied any Covid contact.Denied any muscle ache night sweat,dysuria diarrhea.ABG showed mixed hypoxia and hypercapnia,received Solu-Medrol and bronchodilator.  The patient required BIPAP and then was transitioned to high flow nasal cannula . PCCM was consulted. The patient has advanced emphysema and stable scattered pulmonoary nodules in the right middle and upper lobes. 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 Dr. [name] believes that the patient's pulmonary function is in terminal decline. Recommendation is to continue BIPAP as necessary for now and to treat symptoms with opioids and anxiolytics. Palliative care has been consulted. The patient is agreeable to discharge to home with hospice when she has obtained the maximal benefit of her inpatient stay. She continues to require BIPAP intermittently. Her oxygen requirements today are 8-10 L of heated high flow O2 by nasal cannula.  The patient is under palliative care services, and they have been asked again to investigate the initiation of hospice services for this patient. The patient has refused hospice services in the past. I have consulted TOC for SNF placement. It is not anticipated that the patient will be able to be discharged to home as her friend who has been enabling her to stay at home states that she can no longer do so.  Consultants  . Palliative  Care . PCCM  Procedures  . None  Antibiotics   Anti-infectives (From admission, onward)   Start     Dose/Rate Route Frequency Ordered Stop   01/20/19 1330  doxycycline (VIBRA-TABS) tablet 100 mg     100 mg Oral Every 12 hours 01/20/19 1325 01/25/19 0959     Subjective  The patient is resting quietly in bed. No new complaints.   Objective   Vitals:  Vitals:   01/27/19 1155 01/27/19 1637  BP: 106/70 (!) 98/51  Pulse: 96 80  Resp: 18 17  Temp: 97.7 F (36.5 C) 98 F (36.7 C)  SpO2: 92% 95%   Exam:  Constitutional:  . The patient is awake, alert, and oriented x 3. No acute distress. Respiratory:  . No increased work of breathing. . No wheezes, rales, or rhonchi . No tactile fremitus Cardiovascular:  . Regular rate and rhythm . No murmurs, ectopy, or gallups. . No lateral PMI. No thrills. Abdomen:  . Abdomen is soft, non-tender, non-distended . No hernias, masses, or organomegaly . Normoactive bowel sounds.  Musculoskeletal:  . No cyanosis, clubbing, or edema Skin:  . No rashes, lesions, ulcers . palpation of skin: no induration or nodules Neurologic:  . CN 2-12 intact . Sensation all 4 extremities intact Psychiatric:  . Mental status o Mood, affect appropriate o Orientation to person, place, time  . judgment and insight appear intact  I have personally reviewed the following:   Today's Data  . Vitals, CMP, CBC  Imaging  . CXR: Underlying emphysematous change. Apparent loculated left pleural effusion with patchy left base airspace disease. Suspect a degree of underlying interstitial edema.   Scheduled Meds: . acetaZOLAMIDE  250 mg Oral BID  . arformoterol  15  mcg Nebulization BID  . budesonide (PULMICORT) nebulizer solution  0.25 mg Nebulization BID  . chlorhexidine gluconate (MEDLINE KIT)  15 mL Mouth Rinse BID  . enoxaparin (LOVENOX) injection  40 mg Subcutaneous Q24H  . fluticasone  1 spray Each Nare Daily  . furosemide  40 mg Oral Daily  .  guaiFENesin  600 mg Oral BID  . insulin aspart  0-15 Units Subcutaneous TID WC  . insulin glargine  20 Units Subcutaneous QHS  . ipratropium-albuterol  3 mL Nebulization QID  . iron polysaccharides  150 mg Oral Daily  . methylPREDNISolone (SOLU-MEDROL) injection  60 mg Intravenous Q12H  . pantoprazole  40 mg Oral Daily  . PARoxetine  40 mg Oral q morning - 10a  . umeclidinium bromide  1 puff Inhalation Daily   Continuous Infusions: . sodium chloride 500 mL (01/21/19 2220)    Active Problems:   COPD (chronic obstructive pulmonary disease) (HCC)   Chronic respiratory failure (HCC)   COPD exacerbation (HCC)   LOS: 7 days   A & P  Acute on Chronic Hypoxic and Hypercapnic respiratory failure: This isdue to COPD exacerbation and bronchiectasis possible concomitant acute on chronic diastolic CHF exacerbation, improving slowly. The patient was admitted to the progressive unit inpatient given her use of BiPAP. She had to be placed on a nonrebreather and she desaturated down to 70% when it was taken off; she has been weaned off of it and is on 8 to 9 L of high flow nasal cannula today. She is saturating in the high nineties on 8-9 L of high flow O2. She wears 6L O2 by nasal cannula at baseline for saturations in the low to mid 90's. O2 will be titrated to  91 to 93%.COVID-19 negative. Pt will need BIPAP for sleep upon discharge to home. PCCM has bveen consulted and has consulted palliative care in turn as they believe that the patient's respiratory situation is in a terminal decline. Palliative care has had a discussion with the patient and her family. She is in agreement with discharge to home with hospice when she has reached maximal benefit from her inpatient stay. She is currently receiving oral doxycycline, Brovana, albuterol, pulmicort, incruse ellipta, and duonebs. She is receiving Iv solumedrol bid. Will continue to wean this. She is also receiving IV Lasix, acetazolamide. Respiratory Virus  panel is negative as is COVID-19 testing.  Loculated pleural effusion: Consider VATS, although it seems that, given the patient's comorbidities and state of "terminal decline", as stated by pulmonology, this would not be in the patient's best interests.   Poorly Controlled Diabetes Mellitus Type 2: This patient is receiving Lantus 20 units daily, and she is on a resistant sliding scale. Glucoses are very difficult to control  We will reduce home Lantus dosing from 25 units to 20 units. The patient's glucoses have run from 253 to 398 today. This is likely due to steroids. This has recently been deescalated to 60 mg bid. Continue to wean as possible. Will increase dose of lantus. Recent hemoglobin A1c done on 11/02/2018 was 9.4. Diabetes education coordinator has been consulted for further evaluation recommendations.  Chronic Microcytic Anemia: Pt's hemoglobin initially dropped from 11.9 to 8.6.. Today hemoglobin is stable at 9.1. Anemia is multifactorial including iron deficiency, and anemia of chronic disease. Monitor. FOBT is pending.   Acute on Chronic Diastolic CHF: The patient has been receiving lasix for volume overload. She is currently 3.876 L negative. Volume status, electrolytes, and creatinine will be monitored.  Pulmonary nodules,bilateral lower and mediastinal lymphadenosis/mass: Uunknown etiology,according to Pulm/Critical Care note in October; he is Dr. Fuller Plan. 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 She is currently maintained on Trelegy Ellipta and 20 mg of p.o. prednisone daily. PCCM recommends hospice as the patient is felt to be terminal.  Hypernatremia: Resolved.   Hypokalemia: Supplement and monitor.   Deconditioning: PT/OT is recommending SNF, but she will likely discharge to home with hospice.   Transaminitis: Resolved. Monitor.   Leukocytosis: Continued and likely secondary to steroids.  Monitor. 12.2 today.  Anxiety/Generalized Anxiety Disorder: Continue with anxiolytics at the reduced dose with alprazolam 0.25 mg p.o. 3 times daily as needed for anxiety  Obesity: BM I is 37.95. Complicates all cares.  I have seen and examined this patient myself. I have spent 32 minutes in her evaluation and care.  DVT prophylaxis: Enoxaparin 40 mg sq q24h Code Status: Partial Code : DNI Family Communication: No family present at bedside  Disposition Plan: Pt to discharge to home when maximum benefit of inpatient stay is attained. Palliative care in place. Possible discharge to home with hospice.  Jaremy Nosal, DO [facility] Direct contact: see [email or web address]  7PM-7AM contact night coverage as above 01/27/2019, 4:45 PM  LOS: 5 days

## 2019-01-28 LAB — GLUCOSE, CAPILLARY
Glucose-Capillary: 327 mg/dL — ABNORMAL HIGH (ref 70–99)
Glucose-Capillary: 240 mg/dL — ABNORMAL HIGH (ref 70–99)
Glucose-Capillary: 451 mg/dL — ABNORMAL HIGH (ref 70–99)
Glucose-Capillary: 327 mg/dL — ABNORMAL HIGH (ref 70–99)

## 2019-01-28 LAB — CBC WITH DIFFERENTIAL/PLATELET
Abs Immature Granulocytes: 0.13 10*3/uL — ABNORMAL HIGH (ref 0.00–0.07)
Basophils Absolute: 0 10*3/uL (ref 0.0–0.1)
Basophils Relative: 0 %
Eosinophils Absolute: 0 10*3/uL (ref 0.0–0.5)
Eosinophils Relative: 0 %
HCT: 38.7 % (ref 36.0–46.0)
Hemoglobin: 9.9 g/dL — ABNORMAL LOW (ref 12.0–15.0)
Immature Granulocytes: 1 %
Lymphocytes Relative: 3 %
Lymphs Abs: 0.5 10*3/uL — ABNORMAL LOW (ref 0.7–4.0)
MCH: 18.7 pg — ABNORMAL LOW (ref 26.0–34.0)
MCHC: 25.6 g/dL — ABNORMAL LOW (ref 30.0–36.0)
MCV: 73 fL — ABNORMAL LOW (ref 80.0–100.0)
Monocytes Absolute: 0.6 10*3/uL (ref 0.1–1.0)
Monocytes Relative: 5 %
Neutro Abs: 12.4 10*3/uL — ABNORMAL HIGH (ref 1.7–7.7)
Neutrophils Relative %: 91 %
Platelets: 332 10*3/uL (ref 150–400)
RBC: 5.3 MIL/uL — ABNORMAL HIGH (ref 3.87–5.11)
RDW: 20.6 % — ABNORMAL HIGH (ref 11.5–15.5)
WBC: 13.6 10*3/uL — ABNORMAL HIGH (ref 4.0–10.5)
nRBC: 0.1 % (ref 0.0–0.2)

## 2019-01-28 LAB — BASIC METABOLIC PANEL
Anion gap: 10 (ref 5–15)
BUN: 30 mg/dL — ABNORMAL HIGH (ref 8–23)
CO2: 25 mmol/L (ref 22–32)
Calcium: 8.9 mg/dL (ref 8.9–10.3)
Chloride: 103 mmol/L (ref 98–111)
Creatinine, Ser: 0.98 mg/dL (ref 0.44–1.00)
GFR calc Af Amer: >60 mL/min (ref >60)
GFR calc non Af Amer: 58 mL/min — ABNORMAL LOW (ref >60)
Glucose, Bld: 323 mg/dL — ABNORMAL HIGH (ref 70–99)
Potassium: 3.7 mmol/L (ref 3.5–5.1)
Sodium: 138 mmol/L (ref 135–145)

## 2019-01-28 MED ORDER — INSULIN GLARGINE 100 UNIT/ML ~~LOC~~ SOLN
10.0000 [IU] | Freq: Once | SUBCUTANEOUS | Status: AC
  Administered 2019-01-28: 10 [IU] via SUBCUTANEOUS
  Filled 2019-01-28: qty 0.1

## 2019-01-28 MED ORDER — INSULIN ASPART 100 UNIT/ML ~~LOC~~ SOLN
0.0000 [IU] | Freq: Three times a day (TID) | SUBCUTANEOUS | Status: DC
  Administered 2019-01-28: 20 [IU] via SUBCUTANEOUS
  Administered 2019-01-29: 11 [IU] via SUBCUTANEOUS
  Administered 2019-01-29 (×2): 4 [IU] via SUBCUTANEOUS
  Administered 2019-01-30: 20 [IU] via SUBCUTANEOUS
  Administered 2019-01-30: 3 [IU] via SUBCUTANEOUS
  Administered 2019-01-30: 18 [IU] via SUBCUTANEOUS
  Administered 2019-01-31 (×3): 11 [IU] via SUBCUTANEOUS
  Administered 2019-02-01: 7 [IU] via SUBCUTANEOUS
  Administered 2019-02-01: 15 [IU] via SUBCUTANEOUS
  Administered 2019-02-01: 20 [IU] via SUBCUTANEOUS
  Administered 2019-02-02: 15 [IU] via SUBCUTANEOUS

## 2019-01-28 MED ORDER — INSULIN ASPART 100 UNIT/ML ~~LOC~~ SOLN
0.0000 [IU] | Freq: Every day | SUBCUTANEOUS | Status: DC
  Administered 2019-01-28: 4 [IU] via SUBCUTANEOUS
  Administered 2019-01-29: 22:00:00 3 [IU] via SUBCUTANEOUS
  Administered 2019-01-30: 5 [IU] via SUBCUTANEOUS
  Administered 2019-01-31: 22:00:00 2 [IU] via SUBCUTANEOUS
  Administered 2019-02-01: 5 [IU] via SUBCUTANEOUS

## 2019-01-28 MED ORDER — INSULIN GLARGINE 100 UNIT/ML ~~LOC~~ SOLN
35.0000 [IU] | Freq: Every day | SUBCUTANEOUS | Status: DC
  Administered 2019-01-28: 35 [IU] via SUBCUTANEOUS
  Filled 2019-01-28 (×2): qty 0.35

## 2019-01-28 MED ORDER — INSULIN ASPART 100 UNIT/ML ~~LOC~~ SOLN
3.0000 [IU] | Freq: Three times a day (TID) | SUBCUTANEOUS | Status: DC
  Administered 2019-01-28 – 2019-02-02 (×14): 3 [IU] via SUBCUTANEOUS

## 2019-01-28 NOTE — Progress Notes (Signed)
RT placed pt on BIPAP V60 for the night. Pt settings are 10/6 BUR 10 FIO2 30%. Pt respiratory status is stable at this time. RT will continue to monitor.

## 2019-01-28 NOTE — Progress Notes (Signed)
PROGRESS NOTE  [name] [id number] DOB: 11/15/1945 DOA: 01/20/2019 PCP: Binnie Rail, MD  Brief History   Toni Ndiayeis a 74 y.o.femalewith medical history significant ofCOPDbaseline6 L of oxygen at all times, bronchiectasis, diastolic CHF, HLD, HTN, mediastinal mass, CT, pulmonary nodules, Tobacco abuse psoriasis, GERD, diabetes, presented withincreasing short of breathsince yesterday,with worsening of wheezing and occasional drycough no fevers no chills no chest pain no leg edema shedenied any Covid contact.Denied any muscle ache night sweat,dysuria diarrhea.ABG showed mixed hypoxia and hypercapnia,received Solu-Medrol and bronchodilator.  The patient required BIPAP and then was transitioned to high flow nasal cannula . PCCM was consulted. The patient has advanced emphysema and stable scattered pulmonoary nodules in the right middle and upper lobes. 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 Dr. [name] believes that the patient's pulmonary function is in terminal decline. Recommendation is to continue BIPAP as necessary for now and to treat symptoms with opioids and anxiolytics. Palliative care has been consulted. The patient is agreeable to discharge to home with hospice when she has obtained the maximal benefit of her inpatient stay. She continues to require BIPAP intermittently. Her oxygen requirements today are 8-10 L of heated high flow O2 by nasal cannula.  The patient is under palliative care services, and they have been asked again to investigate the initiation of hospice services for this patient. The patient has refused hospice services in the past. I have consulted TOC for SNF placement. It is not anticipated that the patient will be able to be discharged to home as her friend who has been enabling her to stay at home states that she can no longer do so.  Consultants  . Palliative  Care . PCCM  Procedures  . None  Antibiotics   Anti-infectives (From admission, onward)   Start     Dose/Rate Route Frequency Ordered Stop   01/20/19 1330  doxycycline (VIBRA-TABS) tablet 100 mg     100 mg Oral Every 12 hours 01/20/19 1325 01/25/19 0959     Subjective  The patient is resting quietly in bed. No new complaints.   Objective   Vitals:  Vitals:   01/28/19 1119 01/28/19 1139  BP: (!) 73/61   Pulse: (!) 54 82  Resp: (!) 23 18  Temp: 97.7 F (36.5 C)   SpO2: 96%    Exam:  Constitutional:  . The patient is awake, alert, and oriented x 3. No acute distress. Respiratory:  . No increased work of breathing. . No wheezes, rales, or rhonchi . No tactile fremitus Cardiovascular:  . Regular rate and rhythm . No murmurs, ectopy, or gallups. . No lateral PMI. No thrills. Abdomen:  . Abdomen is soft, non-tender, non-distended . No hernias, masses, or organomegaly . Normoactive bowel sounds.  Musculoskeletal:  . No cyanosis, clubbing, or edema Skin:  . No rashes, lesions, ulcers . palpation of skin: no induration or nodules Neurologic:  . CN 2-12 intact . Sensation all 4 extremities intact Psychiatric:  . Mental status o Mood, affect appropriate o Orientation to person, place, time  . judgment and insight appear intact  I have personally reviewed the following:   Today's Data  . Vitals, CMP, CBC  Imaging  . CXR: Underlying emphysematous change. Apparent loculated left pleural effusion with patchy left base airspace disease. Suspect a degree of underlying interstitial edema.   Scheduled Meds: . acetaZOLAMIDE  250 mg Oral BID  . arformoterol  15 mcg  Nebulization BID  . budesonide (PULMICORT) nebulizer solution  0.25 mg Nebulization BID  . chlorhexidine gluconate (MEDLINE KIT)  15 mL Mouth Rinse BID  . enoxaparin (LOVENOX) injection  40 mg Subcutaneous Q24H  . fluticasone  1 spray Each Nare Daily  . furosemide  40 mg Oral Daily  . guaiFENesin  600  mg Oral BID  . insulin aspart  0-20 Units Subcutaneous TID WC  . insulin aspart  0-5 Units Subcutaneous QHS  . insulin aspart  3 Units Subcutaneous TID WC  . insulin glargine  35 Units Subcutaneous QHS  . ipratropium-albuterol  3 mL Nebulization QID  . iron polysaccharides  150 mg Oral Daily  . pantoprazole  40 mg Oral Daily  . PARoxetine  40 mg Oral q morning - 10a  . predniSONE  40 mg Oral BID WC  . umeclidinium bromide  1 puff Inhalation Daily   Continuous Infusions: . sodium chloride 500 mL (01/21/19 2220)    Active Problems:   COPD (chronic obstructive pulmonary disease) (HCC)   Chronic respiratory failure (HCC)   COPD exacerbation (HCC)   LOS: 8 days   A & P  Acute on Chronic Hypoxic and Hypercapnic respiratory failure: This isdue to COPD exacerbation and bronchiectasis possible concomitant acute on chronic diastolic CHF exacerbation, improving slowly. The patient was admitted to the progressive unit inpatient given her use of BiPAP. She had to be placed on a nonrebreather and she desaturated down to 70% when it was taken off; she has been weaned off of it and is on 8 to 9 L of high flow nasal cannula today. She is saturating in the high nineties on 8-9 L of high flow O2. She wears 6L O2 by nasal cannula at baseline for saturations in the low to mid 90's. O2 will be titrated to  91 to 93%.COVID-19 negative. Pt will need BIPAP for sleep upon discharge to home. PCCM has bveen consulted and has consulted palliative care in turn as they believe that the patient's respiratory situation is in a terminal decline. Palliative care has had a discussion with the patient and her family. She is in agreement with discharge to home with hospice when she has reached maximal benefit from her inpatient stay. She is currently receiving oral doxycycline, Brovana, albuterol, pulmicort, incruse ellipta, and duonebs. She is receiving Iv solumedrol bid. Will continue to wean this. She is also receiving IV  Lasix, acetazolamide. Respiratory Virus panel is negative as is COVID-19 testing.  Loculated pleural effusion: Consider VATS, although it seems that, given the patient's comorbidities and state of "terminal decline", as stated by pulmonology, this would not be in the patient's best interests.   Poorly Controlled Diabetes Mellitus Type 2: This patient is receiving Lantus 20 units daily, and she is on a resistant sliding scale. Glucoses are very difficult to control  We will reduce home Lantus dosing from 25 units to 20 units. The patient's glucoses have run from 253 to 398 today. This is likely due to steroids. This has recently been deescalated to 60 mg bid. Continue to wean as possible. Will increase dose of lantus. Recent hemoglobin A1c done on 11/02/2018 was 9.4. Diabetes education coordinator has been consulted for further evaluation recommendations.  Chronic Microcytic Anemia: Pt's hemoglobin initially dropped from 11.9 to 8.6.. Today hemoglobin is stable at 9.1. Anemia is multifactorial including iron deficiency, and anemia of chronic disease. Monitor. FOBT is pending.   Acute on Chronic Diastolic CHF: The patient has been receiving lasix for  volume overload. She is currently 5.786 L negative. Volume status, electrolytes, and creatinine will be monitored.   Pulmonary nodules,bilateral lower and mediastinal lymphadenosis/mass: Uunknown etiology,according to Pulm/Critical Care note in October; he is Dr. Fuller Plan. 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 She is currently maintained on Trelegy Ellipta and 20 mg of p.o. prednisone daily. PCCM recommends hospice as the patient is felt to be terminal.  Hypernatremia: Resolved.   Hypokalemia: Supplement and monitor.   Deconditioning: PT/OT is recommending SNF, but she will likely discharge to home with hospice.   Transaminitis: Resolved. Monitor.   Leukocytosis:  Continued and likely secondary to steroids. Monitor. 12.2 today.  Anxiety/Generalized Anxiety Disorder: Continue with anxiolytics at the reduced dose with alprazolam 0.25 mg p.o. 3 times daily as needed for anxiety  Obesity: BM I is 37.95. Complicates all cares.  I have seen and examined this patient myself. I have spent 30 minutes in her evaluation and care.  DVT prophylaxis: Enoxaparin 40 mg sq q24h Code Status: Partial Code : DNI Family Communication: No family present at bedside  Disposition Plan: Pt to discharge to home when maximum benefit of inpatient stay is attained. Palliative care in place. She will likely need to discharge to SNF with hospice.  Eilam Shrewsbury, DO [facility] Direct contact: see [email or web address]  7PM-7AM contact night coverage as above 01/28/2019, 4:36 PM  LOS: 5 days

## 2019-01-28 NOTE — Progress Notes (Signed)
Physical Therapy Treatment Patient Details Name: [name] MRN: [id number] DOB: 1945/07/08 Today's Date: 01/28/2019    History of Present Illness 74 y.o. female with medical history significant of COPD baseline 6 L of oxygen at all times, bronchiectasis, diastolic CHF, HLD, HTN, mediastinal mass, CT, pulmonary nodules, Tobacco abuse psoriasis, GERD, diabetes, presented with increasing short of breath since yesterday, with worsening of wheezing and occasional dry cough.    PT Comments    Pt limited by SOB during session as noted below. Pt continues to require assistance during mobility for device management, and due to cardiopulmonary deficits which increase falls risk during short household mobility. Pt may benefit from placement of nasal canula in mouth as pt seems to be a mouth breather during mobility. Pt will benefit from continued acute PT POC to reduce falls risk and improve activity tolerance.   Follow Up Recommendations  SNF;Supervision for mobility/OOB     Equipment Recommendations  (defer to post-acute setting)    Recommendations for Other Services       Precautions / Restrictions Precautions Precautions: Fall Precaution Comments: watch 02 Restrictions Weight Bearing Restrictions: No    Mobility  Bed Mobility Overal bed mobility: Needs Assistance Bed Mobility: Supine to Sit;Sit to Supine     Supine to sit: Supervision Sit to supine: Supervision      Transfers Overall transfer level: Needs assistance Equipment used: 4-wheeled walker Transfers: Sit to/from Stand Sit to Stand: Min guard         General transfer comment: pt requires assistance for device management to lock brakes or stabilize Rollator  Ambulation/Gait Ambulation/Gait assistance: Min guard Gait Distance (Feet): 8 Feet(4' and 8') Assistive device: 4-wheeled walker Gait Pattern/deviations: Wide base of support Gait velocity: reduced Gait velocity interpretation: <1.8 ft/sec, indicate of  risk for recurrent falls General Gait Details: pt with short step through gait, widened BOS   Stairs             Wheelchair Mobility    Modified Rankin (Stroke Patients Only)       Balance Overall balance assessment: Needs assistance Sitting-balance support: No upper extremity supported;Feet supported Sitting balance-Leahy Scale: Good Sitting balance - Comments: supervision   Standing balance support: Bilateral upper extremity supported Standing balance-Leahy Scale: Good Standing balance comment: supervision                            Cognition Arousal/Alertness: Awake/alert Behavior During Therapy: WFL for tasks assessed/performed Overall Cognitive Status: Within Functional Limits for tasks assessed                                        Exercises      General Comments General comments (skin integrity, edema, etc.): Pt on 6L HFNC upon arrival, desaturating to 83% during mobility and requiring 8L HFNC to recover to 90%. PT notes runs of vtach during mobility. Pt with SOB during mobility on 6L HFNC, improved on 8L HFNC.      Pertinent Vitals/Pain Pain Assessment: No/denies pain    Home Living                      Prior Function            PT Goals (current goals can now be found in the care plan section) Acute Rehab PT Goals Patient Stated Goal: to feel  better Progress towards PT goals: Progressing toward goals(slow progression)    Frequency    Min 2X/week      PT Plan Current plan remains appropriate    Co-evaluation              AM-PAC PT "6 Clicks" Mobility   Outcome Measure  Help needed turning from your back to your side while in a flat bed without using bedrails?: A Little Help needed moving from lying on your back to sitting on the side of a flat bed without using bedrails?: A Little Help needed moving to and from a bed to a chair (including a wheelchair)?: A Little Help needed standing up from  a chair using your arms (e.g., wheelchair or bedside chair)?: A Little Help needed to walk in hospital room?: A Little Help needed climbing 3-5 steps with a railing? : Total 6 Click Score: 16    End of Session Equipment Utilized During Treatment: Oxygen Activity Tolerance: Patient limited by fatigue Patient left: in bed;with call bell/phone within reach Nurse Communication: Mobility status PT Visit Diagnosis: Muscle weakness (generalized) (M62.81)     Time: [id number] PT Time Calculation (min) (ACUTE ONLY): 23 min  Charges:  $Gait Training: 8-22 mins $Therapeutic Activity: 8-22 mins                     Zenaida Niece, PT, DPT Acute Rehabilitation Pager: [phone]    Zenaida Niece 01/28/2019, 4:09 PM

## 2019-01-28 NOTE — Progress Notes (Signed)
CBG 451. Dr. Benny Lennert notified.

## 2019-01-28 NOTE — Progress Notes (Addendum)
Plan of care reviewed. Pt's stable. Pt alert and oriented x 4. On 4-5 LPM of HFNCL, able to maintain SPO2 94-96%, able to rest on her bed comfortably. Denied SOB, no pain. NSR with PAC occasionally on monitor,remained afebrile,  HR 80-95, BP 90/54- 112/ 61 mmHg. On BIPAP at night, no acute distress noted tonight. Continue to monitor.   [name] [id number]

## 2019-01-28 NOTE — Progress Notes (Signed)
Results for [name], [name] (MRN [id number]) as of 01/28/2019 11:05  Ref. Range 01/27/2019 06:13 01/27/2019 12:14 01/27/2019 16:39 01/27/2019 22:15 01/28/2019 06:26  Glucose-Capillary Latest Ref Range: 70 - 99 mg/dL 253 (H) 398 (H) 377 (H) 304 (H) 327 (H)  Noted that blood sugars continue to be greater than 250 mg/dl.   Recommend increasing Lantus to 35 units daily, increase Novolog correction scale to RESISTANT TID, add HS scale, and add Novolog 3 units TID with meals if patient eats at least 50% of meal.   Harvel Ricks RN BSN CDE Diabetes Coordinator Pager: [phone]  8am-5pm

## 2019-01-28 NOTE — TOC Initial Note (Signed)
Transition of Care [facility]) - Initial/Assessment Note    Patient Details  Name: [name] MRN: [id number] Date of Birth: Aug 10, 1945  Transition of Care [facility]) CM/SW Contact:    Vinie Sill, [place] Phone Number: 01/28/2019, 8:09 PM  Clinical Narrative:      CSW met with patient at bedside. CSW  Introduced self and explained role. CSW discussed PT recommendations of ST rehab at [facility].patient states she lives alone. Patient states she has a aide that come to the home daily. Patient states limited to no family support. Patient states she has been to SNF rehab at [facility] but has not decided if she wanted to return back to SNF for rehab. Patient states she was not sure at this time and wanted to have more conversation with the MD before making decision. CSW encouraged the patient to explore SNF verses home since she does not have support in the home.    CSW will follow up with the patient for her decision.   Thurmond Butts, MSW, [place] Clinical Social Worker                  Expected Discharge Plan: Home w Hospice Care Barriers to Discharge: Continued Medical Work up   Patient Goals and CMS Choice        Expected Discharge Plan and Services Expected Discharge Plan: [place] In-house Referral: Clinical Social Work Discharge Planning Services: CM Consult Post Acute Care Choice: Hospice Living arrangements for the past 2 months: [place]: Hospice and [place] Date [place]: 01/25/19 Time HH Agency Contacted: 90 Representative spoke with at [place]: Anderson [place]  Prior Living Arrangements/Services Living arrangements for the past 2 months: Apartment Lives with:: Self Patient language and need for interpreter reviewed:: Yes Do you feel safe going back to the place where you live?: Yes      Need for Family Participation in Patient Care: Yes (Comment) Care giver support system in place?: No  (comment) Current home services: DME, Homehealth aide Criminal Activity/Legal Involvement Pertinent to Current Situation/Hospitalization: No - Comment as needed  Activities of Daily Living Home Assistive Devices/Equipment: Gilford Rile (specify type) ADL Screening (condition at time of admission) Patient's cognitive ability adequate to safely complete daily activities?: Yes Is the patient deaf or have difficulty hearing?: No Does the patient have difficulty seeing, even when wearing glasses/contacts?: No Does the patient have difficulty concentrating, remembering, or making decisions?: No Patient able to express need for assistance with ADLs?: Yes Does the patient have difficulty dressing or bathing?: Yes Independently performs ADLs?: Yes (appropriate for developmental age) Does the patient have difficulty walking or climbing stairs?: Yes Weakness of Legs: Both Weakness of Arms/Hands: None  Permission Sought/Granted Permission sought to share information with : Facility Art therapist granted to share information with : Yes, Verbal Permission Granted     Permission granted to share info w AGENCY: [facility]        Emotional Assessment Appearance:: Appears stated age Attitude/Demeanor/Rapport: Engaged Affect (typically observed): Accepting, Appropriate Orientation: : Oriented to Self, Oriented to Place, Oriented to  Time, Oriented to Situation Alcohol / Substance Use: Not Applicable Psych Involvement: No (comment)  Admission diagnosis:  COPD (chronic obstructive pulmonary disease) (HCC) [J44.9] COPD exacerbation ([place]) [J44.1] Acute respiratory failure with hypoxia and hypercapnia ([place]) [J96.01, J96.02] Patient  Active Problem List   Diagnosis Date Noted  . Acute respiratory failure with hypoxia and hypercapnia (HCC)   . On corticosteroid therapy 12/08/2018  . Current chronic use of systemic steroids 12/08/2018  . Complex care coordination 11/13/2018  .  Palliative care by specialist   . DNR (do not resuscitate) discussion   . COPD exacerbation ([place]) 11/02/2018  . Hypokalemia 11/02/2018  . Acute on chronic respiratory failure with hypoxia ([place]) 08/26/2018  . Hyperkalemia 08/26/2018  . Psoriasis 07/01/2017  . Depression 06/24/2016  . Pulmonary hypertension ([place])   . Physical deconditioning 11/15/2015  . GERD (gastroesophageal reflux disease) 11/01/2015  . 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 Diabetes ([place]) 01/23/2015  . Fatigue 01/20/2015  . Obesity, unspecified 03/15/2013  . Acute on chronic diastolic CHF (congestive heart failure) ([place]) 03/14/2013  . Chronic respiratory failure ([place]) 06/30/2012  . COPD (chronic obstructive pulmonary disease) ([place])   . Tobacco abuse   . Pulmonary nodule 03/22/2010  . 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 Bronchiectasis ([place]) 03/22/2010  . Hyperlipidemia 06/13/2008  . Anxiety 02/20/2007  . MITRAL VALVE PROLAPSE 02/20/2007  . CERVICAL CANCER, HX OF 02/20/2007   PCP:  Binnie Rail, MD Pharmacy:   [id number] [place] [id number]: [phone]: [phone]    Social Determinants of Health ([place] Interventions    Readmission Risk Interventions Readmission Risk Prevention Plan 11/06/2018 11/05/2018  Transportation Screening Complete Complete  PCP or Specialist Appt within 3-5 Days Complete -  HRI or [place] Complete  Social Work Consult for [place] City[place]anning/Counseling Complete Complete  Palliative Care Screening Complete Complete  Medication Review (Press photographer Complete Complete  Some recent data might be hidden

## 2019-01-29 LAB — GLUCOSE, CAPILLARY
Glucose-Capillary: 277 mg/dL — ABNORMAL HIGH (ref 70–99)
Glucose-Capillary: 198 mg/dL — ABNORMAL HIGH (ref 70–99)
Glucose-Capillary: 197 mg/dL — ABNORMAL HIGH (ref 70–99)
Glucose-Capillary: 338 mg/dL — ABNORMAL HIGH (ref 70–99)

## 2019-01-29 MED ORDER — INSULIN GLARGINE 100 UNIT/ML ~~LOC~~ SOLN
45.0000 [IU] | Freq: Every day | SUBCUTANEOUS | Status: DC
  Administered 2019-01-29 – 2019-02-01 (×4): 45 [IU] via SUBCUTANEOUS
  Filled 2019-01-29 (×5): qty 0.45

## 2019-01-29 MED ORDER — IPRATROPIUM-ALBUTEROL 0.5-2.5 (3) MG/3ML IN SOLN
3.0000 mL | Freq: Three times a day (TID) | RESPIRATORY_TRACT | Status: DC
  Administered 2019-01-29 – 2019-01-31 (×6): 3 mL via RESPIRATORY_TRACT
  Filled 2019-01-29 (×6): qty 3

## 2019-01-29 NOTE — Progress Notes (Addendum)
[facility]  ACC will continue to follow and anticipate needs at d/c.  She is our current palliative pt in the community and well known to [place].  We were asked to evaluate for hospice appropriateness, but appears that SNF may be in her best interest in light of the support she has at home (aide services 8 hrs/week, no local family that is involved).  Pt would have to stop her aide services in order to start hospice--these services are provide through a grant and funds are limited ([place]).  [name] provides the services and they are willing to continue to provide the aides but the pt would have to pay if she has hospice.    Should she agree to hospice, please contact us.  Venia Carbon RN, BSN, [facility] Liaison (in [place])

## 2019-01-29 NOTE — NC FL2 (Signed)
[place] LEVEL OF CARE SCREENING TOOL     IDENTIFICATION  Patient Name: [name] Birthdate: 04-Dec-1945 Sex: female Admission Date (Current Location): 01/20/2019  [facility] and [place] Number:  Herbalist and Address:  [facility] [place] [address] [place], [place] [address]      Provider Number: [id number]  Attending Physician Name and Address:  Karie Kirks, DO  Relative Name and Phone Number:  Linton Rump, son, [phone]    Current Level of Care: Hospital Recommended Level of Care: [place] Prior Approval Number:    Date Approved/Denied:   PASRR Number: [id number]  Discharge Plan: SNF    Current Diagnoses: Patient Active Problem List   Diagnosis Date Noted  . Acute respiratory failure with hypoxia and hypercapnia (HCC)   . On corticosteroid therapy 12/08/2018  . Current chronic use of systemic steroids 12/08/2018  . Complex care coordination 11/13/2018  . Palliative care by specialist   . DNR (do not resuscitate) discussion   . COPD exacerbation ([place]) 11/02/2018  . Hypokalemia 11/02/2018  . Acute on chronic respiratory failure with hypoxia ([place]) 08/26/2018  . Hyperkalemia 08/26/2018  . Psoriasis 07/01/2017  . Depression 06/24/2016  . Pulmonary hypertension ([place])   . Physical deconditioning 11/15/2015  . GERD (gastroesophageal reflux disease) 11/01/2015  . 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 Diabetes ([place]) 01/23/2015  . Fatigue 01/20/2015  . Obesity, unspecified 03/15/2013  . Acute on chronic diastolic CHF (congestive heart failure) ([place]) 03/14/2013  . Chronic respiratory failure ([place]) 06/30/2012  . COPD (chronic obstructive pulmonary disease) ([place])   . Tobacco abuse   . Pulmonary nodule 03/22/2010  . 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 Bronchiectasis ([place]) 03/22/2010  . Hyperlipidemia 06/13/2008  . Anxiety 02/20/2007  . MITRAL VALVE PROLAPSE 02/20/2007  . CERVICAL CANCER, HX OF 02/20/2007     Orientation RESPIRATION BLADDER Height & Weight     Self, Time, Situation, Place  O2(Nasal cannula 6L) Incontinent, External catheter Weight: 202 lb 2.6 oz (91.7 kg) Height:  '5\' 1"'$  (154.9 cm)  BEHAVIORAL SYMPTOMS/MOOD NEUROLOGICAL BOWEL NUTRITION STATUS      Continent Diet(Please see DC Summary)  AMBULATORY STATUS COMMUNICATION OF NEEDS Skin   Limited Assist Verbally Normal                       Personal Care Assistance Level of Assistance  Bathing, Feeding, Dressing Bathing Assistance: Limited assistance Feeding assistance: Independent Dressing Assistance: Limited assistance     Functional Limitations Info  Sight, Hearing, Speech Sight Info: Adequate Hearing Info: Adequate Speech Info: Adequate    SPECIAL CARE FACTORS FREQUENCY  PT (By licensed PT), OT (By licensed OT)     PT Frequency: 5x OT Frequency: 5x            Contractures Contractures Info: Not present    Additional Factors Info  Code Status, Allergies, Psychotropic, Insulin Sliding Scale Code Status Info: Partial Allergies Info: Ambien Psychotropic Info: Paxil Insulin Sliding Scale Info: See DC Summary for dose       Current Medications (01/29/2019):  This is the current hospital active medication list Current Facility-Administered Medications  Medication Dose Route Frequency Provider Last Rate Last Admin  . 0.9 %  sodium chloride infusion   Intravenous PRN [name] T, MD 10 mL/hr at 01/21/19 2220 500 mL at 01/21/19 2220  . acetaminophen (TYLENOL) tablet 650 mg  650 mg Oral Q6H PRN Lequita Halt, MD      .  acetaZOLAMIDE (DIAMOX) tablet 250 mg  250 mg Oral BID [name] T, MD   250 mg at 01/29/19 0900  . albuterol (VENTOLIN HFA) 108 (90 Base) MCG/ACT inhaler 2 puff  2 puff Inhalation Q6H PRN [name] T, MD      . ALPRAZolam Duanne Moron) tablet 0.25 mg  0.25 mg Oral TID PRN [name] T, MD   0.25 mg at 01/29/19 1644  . arformoterol (BROVANA) nebulizer solution 15 mcg  15 mcg Nebulization BID  Raiford Noble [place], DO   15 mcg at 01/29/19 4035  . budesonide (PULMICORT) nebulizer solution 0.25 mg  0.25 mg Nebulization BID Raiford Noble [place], DO   0.25 mg at 01/29/19 2481  . chlorhexidine gluconate (MEDLINE KIT) (PERIDEX) 0.12 % solution 15 mL  15 mL Mouth Rinse BID [name] T, MD   15 mL at 01/29/19 0901  . enoxaparin (LOVENOX) injection 40 mg  40 mg Subcutaneous Q24H [name] T, MD   40 mg at 01/29/19 1405  . fluticasone (FLONASE) 50 MCG/ACT nasal spray 1 spray  1 spray Each Nare Daily [name] T, MD   1 spray at 01/29/19 0901  . furosemide (LASIX) tablet 40 mg  40 mg Oral Daily [name] T, MD   40 mg at 01/29/19 0901  . guaiFENesin (MUCINEX) 12 hr tablet 600 mg  600 mg Oral BID [name] T, MD   600 mg at 01/29/19 0900  . insulin aspart (novoLOG) injection 0-20 Units  0-20 Units Subcutaneous TID WC Swayze, Ava, DO   4 Units at 01/29/19 1644  . insulin aspart (novoLOG) injection 0-5 Units  0-5 Units Subcutaneous QHS Swayze, Ava, DO   4 Units at 01/28/19 2204  . insulin aspart (novoLOG) injection 3 Units  3 Units Subcutaneous TID WC Swayze, Ava, DO   3 Units at 01/29/19 1644  . insulin glargine (LANTUS) injection 35 Units  35 Units Subcutaneous QHS Swayze, Ava, DO   35 Units at 01/28/19 2203  . ipratropium-albuterol (DUONEB) 0.5-2.5 (3) MG/3ML nebulizer solution 3 mL  3 mL Nebulization TID Swayze, Ava, DO   3 mL at 01/29/19 1329  . iron polysaccharides (NIFEREX) capsule 150 mg  150 mg Oral Daily Raiford Noble [place], DO   150 mg at 01/29/19 1405  . pantoprazole (PROTONIX) EC tablet 40 mg  40 mg Oral Daily [name] T, MD   40 mg at 01/29/19 0901  . PARoxetine (PAXIL) tablet 40 mg  40 mg Oral q morning - 10a [name] T, MD   40 mg at 01/29/19 0900  . predniSONE (DELTASONE) tablet 40 mg  40 mg Oral BID WC Swayze, Ava, DO   40 mg at 01/29/19 1643  . umeclidinium bromide (INCRUSE ELLIPTA) 62.5 MCG/INH 1 puff  1 puff Inhalation Daily Raiford Noble [place], DO   1 puff at 01/29/19  0820     Discharge Medications: Please see discharge summary for a list of discharge medications.  Relevant Imaging Results:  Relevant Lab Results:   Additional Information SSN [id number]                 COVID negative on 12/30  Lissa Morales Jourdin Gens, LCSW

## 2019-01-29 NOTE — TOC Progression Note (Signed)
Transition of Care (TOC) - Progression Note  [name] RN, BSN Transitions of Care Unit 4E- RN Case Manager [phone]   Patient Details  Name: [name] MRN: [id number] Date of Birth: [birth year]  [facility]) CM/SW Contact  Dahlia Client Romeo Rabon, RN Phone Number: 01/29/2019, 4:22 PM  Clinical Narrative:    Follow up done with pt regarding transition of care plans- per conversation with pt she is concerned that she will have a copay cost if she goes to a SNF for rehab- asked pt if we verified that she does not have copay to go would she be agreeable and pt verbalized yes- and agreed for [facility] to proceed with bed search- CSW to send out FL2 for bed search and will need to f/u with bed offers  - it does not appear that pt is in her copay days for SNF.    Expected Discharge Plan: Home w Hospice Care Barriers to Discharge: Continued Medical Work up  Expected Discharge Plan and Services Expected Discharge Plan: [place] In-house Referral: Clinical Social Work Discharge Planning Services: CM Consult Post Acute Care Choice: Hospice Living arrangements for the past 2 months: [place]: Hospice and [place] Date [place]: 01/25/19 Time HH Agency Contacted: 1300 Representative spoke with at [place]: [place] ([place]) Interventions    Readmission Risk Interventions Readmission Risk Prevention Plan 11/06/2018 11/05/2018  Transportation Screening Complete Complete  PCP or Specialist Appt within 3-5 Days Complete -  HRI or [place] Complete Complete  Social Work Consult for [place] Planning/Counseling Complete Complete  Palliative Care Screening Complete Complete  Medication Review Press photographer) Complete Complete  Some recent data might be hidden

## 2019-01-29 NOTE — Progress Notes (Signed)
RT offered to place pt on BIPAP V60 for the night and pt states she was sleeping fine without it. Pt respiratory status is stable at this time. Pt sats are 95% and stable. RT will continue to monitor.

## 2019-01-29 NOTE — Progress Notes (Signed)
RT placed pt on BIPAP V60 for the night with settings of 10/5 30% BUR 10. Pt respiratory status stable at this time on BIPAP V60. RT will continue to monitor.

## 2019-01-29 NOTE — Progress Notes (Signed)
Care plan reviewed. Pt's progressing.  Pt's able to tolerate well on O2 HFNCL 6 LPM. Tonight, she refused BiPAP. Her vital remained stable. No acute respiratory distress noted. She also requested Xanax at bedtime. Pt 's sleeping well. Denied pain.  Temp 98 - 98.1 degree F, BP 96/50 - 90/56 mmHg HR 89-97 RR 18-20 SPO2 97-100% on 6LPM HFNCL  Continue to monitor.  Kennyth Lose, RN

## 2019-01-30 LAB — GLUCOSE, CAPILLARY
Glucose-Capillary: 330 mg/dL — ABNORMAL HIGH (ref 70–99)
Glucose-Capillary: 126 mg/dL — ABNORMAL HIGH (ref 70–99)
Glucose-Capillary: 376 mg/dL — ABNORMAL HIGH (ref 70–99)
Glucose-Capillary: 369 mg/dL — ABNORMAL HIGH (ref 70–99)

## 2019-01-30 LAB — CBC WITH DIFFERENTIAL/PLATELET
Abs Immature Granulocytes: 0.21 10*3/uL — ABNORMAL HIGH (ref 0.00–0.07)
Basophils Absolute: 0 10*3/uL (ref 0.0–0.1)
Basophils Relative: 0 %
Eosinophils Absolute: 0 10*3/uL (ref 0.0–0.5)
Eosinophils Relative: 0 %
HCT: 36.8 % (ref 36.0–46.0)
Hemoglobin: 9.6 g/dL — ABNORMAL LOW (ref 12.0–15.0)
Immature Granulocytes: 1 %
Lymphocytes Relative: 3 %
Lymphs Abs: 0.5 10*3/uL — ABNORMAL LOW (ref 0.7–4.0)
MCH: 18.9 pg — ABNORMAL LOW (ref 26.0–34.0)
MCHC: 26.1 g/dL — ABNORMAL LOW (ref 30.0–36.0)
MCV: 72.6 fL — ABNORMAL LOW (ref 80.0–100.0)
Monocytes Absolute: 0.9 10*3/uL (ref 0.1–1.0)
Monocytes Relative: 6 %
Neutro Abs: 13.8 10*3/uL — ABNORMAL HIGH (ref 1.7–7.7)
Neutrophils Relative %: 90 %
Platelets: 370 10*3/uL (ref 150–400)
RBC: 5.07 MIL/uL (ref 3.87–5.11)
RDW: 21.6 % — ABNORMAL HIGH (ref 11.5–15.5)
WBC: 15.4 10*3/uL — ABNORMAL HIGH (ref 4.0–10.5)
nRBC: 0.1 % (ref 0.0–0.2)

## 2019-01-30 LAB — URINALYSIS, ROUTINE W REFLEX MICROSCOPIC
Bilirubin Urine: NEGATIVE
Glucose, UA: >=500 mg/dL — AB
Ketones, ur: NEGATIVE mg/dL
Nitrite: NEGATIVE
Protein, ur: NEGATIVE mg/dL
Specific Gravity, Urine: 1.023 (ref 1.005–1.030)
WBC, UA: >50 WBC/hpf — ABNORMAL HIGH (ref 0–5)
pH: 6 (ref 5.0–8.0)

## 2019-01-30 LAB — BASIC METABOLIC PANEL
Anion gap: 10 (ref 5–15)
BUN: 31 mg/dL — ABNORMAL HIGH (ref 8–23)
CO2: 24 mmol/L (ref 22–32)
Calcium: 9.1 mg/dL (ref 8.9–10.3)
Chloride: 105 mmol/L (ref 98–111)
Creatinine, Ser: 1.08 mg/dL — ABNORMAL HIGH (ref 0.44–1.00)
GFR calc Af Amer: 60 mL/min — ABNORMAL LOW (ref >60)
GFR calc non Af Amer: 52 mL/min — ABNORMAL LOW (ref >60)
Glucose, Bld: 390 mg/dL — ABNORMAL HIGH (ref 70–99)
Potassium: 3.7 mmol/L (ref 3.5–5.1)
Sodium: 139 mmol/L (ref 135–145)

## 2019-01-30 NOTE — Progress Notes (Signed)
PROGRESS NOTE  Debbra Digiulio JBH:[id number] DOB: July 02, 1945 DOA: 01/20/2019 PCP: Binnie Rail, MD  Brief History   Sayda Ndiayeis a 74 y.o.femalewith medical history significant ofCOPDbaseline6 L of oxygen at all times, bronchiectasis, diastolic CHF, HLD, HTN, mediastinal mass, CT, pulmonary nodules, Tobacco abuse psoriasis, GERD, diabetes, presented withincreasing short of breathsince yesterday,with worsening of wheezing and occasional drycough no fevers no chills no chest pain no leg edema shedenied any Covid contact.Denied any muscle ache night sweat,dysuria diarrhea.ABG showed mixed hypoxia and hypercapnia,received Solu-Medrol and bronchodilator.  The patient required BIPAP and then was transitioned to high flow nasal cannula . PCCM was consulted. The patient has advanced emphysema and stable scattered pulmonoary nodules in the right middle and upper lobes. 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 Dr. [name] believes that the patient's pulmonary function is in terminal decline. Recommendation is to continue BIPAP as necessary for now and to treat symptoms with opioids and anxiolytics. Palliative care has been consulted. The patient is agreeable to discharge to home with hospice when she has obtained the maximal benefit of her inpatient stay. She continues to require BIPAP intermittently. Her oxygen requirements today are 8-10 L of heated high flow O2 by nasal cannula.  The patient is under palliative care services, and they have been asked again to investigate the initiation of hospice services for this patient. The patient has refused hospice services in the past. I have consulted TOC for SNF placement. It is not anticipated that the patient will be able to be discharged to home as her friend who has been enabling her to stay at home states that she can no longer do so. TOC is seeking SNF  placement.  Consultants  . Palliative Care . PCCM  Procedures  . None  Antibiotics   Anti-infectives (From admission, onward)   Start     Dose/Rate Route Frequency Ordered Stop   01/20/19 1330  doxycycline (VIBRA-TABS) tablet 100 mg     100 mg Oral Every 12 hours 01/20/19 1325 01/25/19 0959     Subjective  The patient is resting quietly in bed. No new complaints.   Objective   Vitals:  Vitals:   01/30/19 0726 01/30/19 1137  BP:  105/62  Pulse:  85  Resp:  20  Temp:  98 F (36.7 C)  SpO2: 100% 97%   Exam:  Constitutional:  . The patient is awake, alert, and oriented x 3. No acute distress. Respiratory:  . No increased work of breathing. . No wheezes, rales, or rhonchi . No tactile fremitus Cardiovascular:  . Regular rate and rhythm . No murmurs, ectopy, or gallups. . No lateral PMI. No thrills. Abdomen:  . Abdomen is soft, non-tender, non-distended . No hernias, masses, or organomegaly . Normoactive bowel sounds.  Musculoskeletal:  . No cyanosis, clubbing, or edema Skin:  . No rashes, lesions, ulcers . palpation of skin: no induration or nodules Neurologic:  . CN 2-12 intact . Sensation all 4 extremities intact Psychiatric:  . Mental status o Mood, affect appropriate o Orientation to person, place, time  . judgment and insight appear intact  I have personally reviewed the following:   Today's Data  . Vitals, BMP, CBC  Imaging  . CXR: Underlying emphysematous change. Apparent loculated left pleural effusion with patchy left base airspace disease. Suspect a degree of underlying interstitial edema.   Scheduled Meds: . acetaZOLAMIDE  250 mg Oral BID  . arformoterol  15 mcg Nebulization BID  . budesonide (PULMICORT) nebulizer solution  0.25 mg Nebulization BID  . chlorhexidine gluconate (MEDLINE KIT)  15 mL Mouth Rinse BID  . enoxaparin (LOVENOX) injection  40 mg Subcutaneous Q24H  . fluticasone  1 spray Each Nare Daily  . furosemide  40 mg Oral  Daily  . guaiFENesin  600 mg Oral BID  . insulin aspart  0-20 Units Subcutaneous TID WC  . insulin aspart  0-5 Units Subcutaneous QHS  . insulin aspart  3 Units Subcutaneous TID WC  . insulin glargine  45 Units Subcutaneous QHS  . ipratropium-albuterol  3 mL Nebulization TID  . iron polysaccharides  150 mg Oral Daily  . pantoprazole  40 mg Oral Daily  . PARoxetine  40 mg Oral q morning - 10a  . predniSONE  40 mg Oral BID WC  . umeclidinium bromide  1 puff Inhalation Daily   Continuous Infusions: . sodium chloride 500 mL (01/21/19 2220)    Active Problems:   COPD (chronic obstructive pulmonary disease) (HCC)   Chronic respiratory failure (HCC)   COPD exacerbation (HCC)   LOS: 10 days   A & P  Acute on Chronic Hypoxic and Hypercapnic respiratory failure: This isdue to COPD exacerbation and bronchiectasis possible concomitant acute on chronic diastolic CHF exacerbation, improving slowly. The patient was admitted to the progressive unit inpatient given her use of BiPAP. She had to be placed on a nonrebreather and she desaturated down to 70% when it was taken off; she has been weaned off of it and is on 8 to 9 L of high flow nasal cannula today. She is saturating in the high nineties on 8-9 L of high flow O2. She wears 6L O2 by nasal cannula at baseline for saturations in the low to mid 90's. O2 will be titrated to  91 to 93%.COVID-19 negative. Pt will need BIPAP for sleep upon discharge to home. PCCM has bveen consulted and has consulted palliative care in turn as they believe that the patient's respiratory situation is in a terminal decline. Palliative care has had a discussion with the patient and her family. She is in agreement with discharge to home with hospice when she has reached maximal benefit from her inpatient stay. She is currently receiving oral doxycycline, Brovana, albuterol, pulmicort, incruse ellipta, and duonebs. She is receiving Iv solumedrol bid. Will continue to wean  this. She is also receiving IV Lasix, acetazolamide. Respiratory Virus panel is negative as is COVID-19 testing.  Loculated pleural effusion: Consider VATS, although it seems that, given the patient's comorbidities and state of "terminal decline", as stated by pulmonology, this would not be in the patient's best interests.   Poorly Controlled Diabetes Mellitus Type 2: This patient is receiving Lantus 20 units daily, and she is on a resistant sliding scale. Glucoses are very difficult to control  We will reduce home Lantus dosing from 25 units to 20 units. The patient's glucoses have run from 253 to 398 today. This is likely due to steroids. This has recently been deescalated to 60 mg bid. Continue to wean as possible. Will increase dose of lantus. Recent hemoglobin A1c done on 11/02/2018 was 9.4. Diabetes education coordinator has been consulted for further evaluation recommendations.  Chronic Microcytic Anemia: Pt's hemoglobin initially dropped from 11.9 to 8.6.. Today hemoglobin is stable at 9.1. Anemia is multifactorial including iron deficiency, and anemia of chronic disease. Monitor. FOBT is pending.   Acute on Chronic Diastolic CHF: The patient has been receiving  lasix for volume overload. She is currently 5.786 L negative. Volume status, electrolytes, and creatinine will be monitored.   Pulmonary nodules,bilateral lower and mediastinal lymphadenosis/mass: Uunknown etiology,according to Pulm/Critical Care note in October; he is Dr. Fuller Plan. 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 She is currently maintained on Trelegy Ellipta and 20 mg of p.o. prednisone daily. PCCM recommends hospice as the patient is felt to be terminal.  Hypernatremia: Resolved.   Hypokalemia: Supplement and monitor.   Deconditioning: PT/OT is recommending SNF, but she will likely discharge to home with hospice.   Transaminitis: Resolved.  Monitor.   Leukocytosis: Continued and likely secondary to steroids. Monitor. 12.2 today.  Anxiety/Generalized Anxiety Disorder: Continue with anxiolytics at the reduced dose with alprazolam 0.25 mg p.o. 3 times daily as needed for anxiety  Obesity: BM I is 37.95. Complicates all cares.  I have seen and examined this patient myself. I have spent 34 minutes in her evaluation and care.  DVT prophylaxis: Enoxaparin 40 mg sq q24h Code Status: Partial Code : DNI Family Communication: No family present at bedside  Disposition Plan: Pt to discharge to home when maximum benefit of inpatient stay is attained. Palliative care in place. She will likely need to discharge to SNF with hospice.  Carrick Rijos, DO [facility] Direct contact: see [email or web address]  7PM-7AM contact night coverage as above 01/29/2019, 4:20 PM  LOS: 5 days

## 2019-01-30 NOTE — Progress Notes (Addendum)
PROGRESS NOTE  [name] JBH:[id number] DOB: July 02, 1945 DOA: 01/20/2019 PCP: Binnie Rail, MD  Brief History   [name] a 74 y.o.femalewith medical history significant ofCOPDbaseline6 L of oxygen at all times, bronchiectasis, diastolic CHF, HLD, HTN, mediastinal mass, CT, pulmonary nodules, Tobacco abuse psoriasis, GERD, diabetes, presented withincreasing short of breathsince yesterday,with worsening of wheezing and occasional drycough no fevers no chills no chest pain no leg edema shedenied any Covid contact.Denied any muscle ache night sweat,dysuria diarrhea.ABG showed mixed hypoxia and hypercapnia,received Solu-Medrol and bronchodilator.  The patient required BIPAP and then was transitioned to high flow nasal cannula . PCCM was consulted. The patient has advanced emphysema and stable scattered pulmonoary nodules in the right middle and upper lobes. 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 Dr. [name] believes that the patient's pulmonary function is in terminal decline. Recommendation is to continue BIPAP as necessary for now and to treat symptoms with opioids and anxiolytics. Palliative care has been consulted. The patient is agreeable to discharge to home with hospice when she has obtained the maximal benefit of her inpatient stay. She continues to require BIPAP intermittently. Her oxygen requirements today are 8-10 L of heated high flow O2 by nasal cannula.  The patient is under palliative care services, and they have been asked again to investigate the initiation of hospice services for this patient. The patient has refused hospice services in the past. I have consulted TOC for SNF placement. It is not anticipated that the patient will be able to be discharged to home as her friend who has been enabling her to stay at home states that she can no longer do so. TOC is seeking SNF  placement.  Consultants  . Palliative Care . PCCM  Procedures  . None  Antibiotics   Anti-infectives (From admission, onward)   Start     Dose/Rate Route Frequency Ordered Stop   01/20/19 1330  doxycycline (VIBRA-TABS) tablet 100 mg     100 mg Oral Every 12 hours 01/20/19 1325 01/25/19 0959     Subjective  The patient is resting quietly in bed. No new complaints.   Objective   Vitals:  Vitals:   01/30/19 0726 01/30/19 1137  BP:  105/62  Pulse:  85  Resp:  20  Temp:  98 F (36.7 C)  SpO2: 100% 97%   Exam:  Constitutional:  . The patient is awake, alert, and oriented x 3. No acute distress. Respiratory:  . No increased work of breathing. . No wheezes, rales, or rhonchi . No tactile fremitus Cardiovascular:  . Regular rate and rhythm . No murmurs, ectopy, or gallups. . No lateral PMI. No thrills. Abdomen:  . Abdomen is soft, non-tender, non-distended . No hernias, masses, or organomegaly . Normoactive bowel sounds.  Musculoskeletal:  . No cyanosis, clubbing, or edema Skin:  . No rashes, lesions, ulcers . palpation of skin: no induration or nodules Neurologic:  . CN 2-12 intact . Sensation all 4 extremities intact Psychiatric:  . Mental status o Mood, affect appropriate o Orientation to person, place, time  . judgment and insight appear intact  I have personally reviewed the following:   Today's Data  . Vitals, BMP, CBC  Imaging  . CXR: Underlying emphysematous change. Apparent loculated left pleural effusion with patchy left base airspace disease. Suspect a degree of underlying interstitial edema.   Scheduled Meds: . acetaZOLAMIDE  250 mg Oral BID  . arformoterol  15 mcg Nebulization BID  . budesonide (PULMICORT) nebulizer solution  0.25 mg Nebulization BID  . chlorhexidine gluconate (MEDLINE KIT)  15 mL Mouth Rinse BID  . enoxaparin (LOVENOX) injection  40 mg Subcutaneous Q24H  . fluticasone  1 spray Each Nare Daily  . furosemide  40 mg Oral  Daily  . guaiFENesin  600 mg Oral BID  . insulin aspart  0-20 Units Subcutaneous TID WC  . insulin aspart  0-5 Units Subcutaneous QHS  . insulin aspart  3 Units Subcutaneous TID WC  . insulin glargine  45 Units Subcutaneous QHS  . ipratropium-albuterol  3 mL Nebulization TID  . iron polysaccharides  150 mg Oral Daily  . pantoprazole  40 mg Oral Daily  . PARoxetine  40 mg Oral q morning - 10a  . predniSONE  40 mg Oral BID WC  . umeclidinium bromide  1 puff Inhalation Daily   Continuous Infusions: . sodium chloride 500 mL (01/21/19 2220)    Active Problems:   COPD (chronic obstructive pulmonary disease) (HCC)   Chronic respiratory failure (HCC)   COPD exacerbation (HCC)   LOS: 10 days   A & P  Acute on Chronic Hypoxic and Hypercapnic respiratory failure: This isdue to COPD exacerbation and bronchiectasis possible concomitant acute on chronic diastolic CHF exacerbation, improving slowly. The patient was admitted to the progressive unit inpatient given her use of BiPAP. She had to be placed on a nonrebreather and she desaturated down to 70% when it was taken off; she has been weaned off of it and is on 8 to 9 L of high flow nasal cannula today. She is saturating in the high nineties on 8-9 L of high flow O2. She wears 6L O2 by nasal cannula at baseline for saturations in the low to mid 90's. O2 will be titrated to  91 to 93%.COVID-19 negative. Pt will need BIPAP for sleep upon discharge to home. PCCM has bveen consulted and has consulted palliative care in turn as they believe that the patient's respiratory situation is in a terminal decline. Palliative care has had a discussion with the patient and her family. She is in agreement with discharge to home with hospice when she has reached maximal benefit from her inpatient stay. She is currently receiving oral doxycycline, Brovana, albuterol, pulmicort, incruse ellipta, and duonebs. She is receiving Iv solumedrol bid. Will continue to wean  this. She is also receiving IV Lasix, acetazolamide. Respiratory Virus panel is negative as is COVID-19 testing.  Loculated pleural effusion: Consider VATS, although it seems that, given the patient's comorbidities and state of "terminal decline", as stated by pulmonology, this would not be in the patient's best interests.   Poorly Controlled Diabetes Mellitus Type 2: This patient is receiving Lantus 20 units daily, and she is on a resistant sliding scale. Glucoses are very difficult to control  We will reduce home Lantus dosing from 25 units to 20 units. The patient's glucoses have run from 253 to 398 today. This is likely due to steroids. This has recently been deescalated to 60 mg bid. Continue to wean as possible. Will increase dose of lantus. Recent hemoglobin A1c done on 11/02/2018 was 9.4. Diabetes education coordinator has been consulted for further evaluation recommendations.  Chronic Microcytic Anemia: Pt's hemoglobin initially dropped from 11.9 to 8.6.. Today hemoglobin is stable at 9.6. Anemia is multifactorial including iron deficiency, and anemia of chronic disease. Monitor. FOBT is pending.   Acute on Chronic Diastolic CHF: The patient has been receiving  lasix for volume overload. She is currently 5.786 L negative. Volume status, electrolytes, and creatinine will be monitored.   Pulmonary nodules,bilateral lower and mediastinal lymphadenosis/mass: Uunknown etiology,according to Pulm/Critical Care note in October; he is Dr. Fuller Plan. 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 She is currently maintained on Trelegy Ellipta and 20 mg of p.o. prednisone daily. PCCM recommends hospice as the patient is felt to be terminal.  Hypernatremia: Resolved.   Hypokalemia: Supplement and monitor.   Deconditioning: PT/OT is recommending SNF, but she will likely discharge to home with hospice.   Transaminitis: Resolved.  Monitor.   Leukocytosis: Continued and likely secondary to steroids. Monitor. 12.2 today.  Anxiety/Generalized Anxiety Disorder: Continue with anxiolytics at the reduced dose with alprazolam 0.25 mg p.o. 3 times daily as needed for anxiety  Obesity: BM I is 37.95. Complicates all cares.  I have seen and examined this patient myself. I have spent 30 minutes in her evaluation and care.  DVT prophylaxis: Enoxaparin 40 mg sq q24h Code Status: Partial Code : DNI Family Communication: No family present at bedside  Disposition Plan: Pt to discharge to home when maximum benefit of inpatient stay is attained. Palliative care in place. She will likely need to discharge to SNF with hospice.  Donalee Gaumond, DO [facility] Direct contact: see [email or web address]  7PM-7AM contact night coverage as above 01/30/2019, 4:20 PM  LOS: 5 days

## 2019-01-30 NOTE — Progress Notes (Signed)
Pt placed on BIPAP V60 for the night with settings of 10/5 BUR 10 and 30%. Pt respiratory status is stable at this time. RT will continue to monitor.

## 2019-01-31 LAB — CBC WITH DIFFERENTIAL/PLATELET
Abs Immature Granulocytes: 0.28 10*3/uL — ABNORMAL HIGH (ref 0.00–0.07)
Basophils Absolute: 0 10*3/uL (ref 0.0–0.1)
Basophils Relative: 0 %
Eosinophils Absolute: 0 10*3/uL (ref 0.0–0.5)
Eosinophils Relative: 0 %
HCT: 41.5 % (ref 36.0–46.0)
Hemoglobin: 11.2 g/dL — ABNORMAL LOW (ref 12.0–15.0)
Immature Granulocytes: 2 %
Lymphocytes Relative: 3 %
Lymphs Abs: 0.5 10*3/uL — ABNORMAL LOW (ref 0.7–4.0)
MCH: 19.7 pg — ABNORMAL LOW (ref 26.0–34.0)
MCHC: 27 g/dL — ABNORMAL LOW (ref 30.0–36.0)
MCV: 73.1 fL — ABNORMAL LOW (ref 80.0–100.0)
Monocytes Absolute: 0.9 10*3/uL (ref 0.1–1.0)
Monocytes Relative: 6 %
Neutro Abs: 13.6 10*3/uL — ABNORMAL HIGH (ref 1.7–7.7)
Neutrophils Relative %: 89 %
Platelets: UNDETERMINED 10*3/uL (ref 150–400)
RBC: 5.68 MIL/uL — ABNORMAL HIGH (ref 3.87–5.11)
RDW: 22.3 % — ABNORMAL HIGH (ref 11.5–15.5)
WBC: 15.2 10*3/uL — ABNORMAL HIGH (ref 4.0–10.5)
nRBC: 0 % (ref 0.0–0.2)

## 2019-01-31 LAB — GLUCOSE, CAPILLARY
Glucose-Capillary: 283 mg/dL — ABNORMAL HIGH (ref 70–99)
Glucose-Capillary: 269 mg/dL — ABNORMAL HIGH (ref 70–99)
Glucose-Capillary: 277 mg/dL — ABNORMAL HIGH (ref 70–99)
Glucose-Capillary: 207 mg/dL — ABNORMAL HIGH (ref 70–99)

## 2019-01-31 NOTE — Progress Notes (Signed)
PROGRESS NOTE  [name] TSV:[id number] DOB: 03-Nov-1945 DOA: 01/20/2019 PCP: Binnie Rail, MD  Brief History   [name] a 74 y.o.femalewith medical history significant ofCOPDbaseline6 L of oxygen at all times, bronchiectasis, diastolic CHF, HLD, HTN, mediastinal mass, CT, pulmonary nodules, Tobacco abuse psoriasis, GERD, diabetes, presented withincreasing short of breathsince yesterday,with worsening of wheezing and occasional drycough no fevers no chills no chest pain no leg edema shedenied any Covid contact.Denied any muscle ache night sweat,dysuria diarrhea.ABG showed mixed hypoxia and hypercapnia,received Solu-Medrol and bronchodilator.  The patient required BIPAP and then was transitioned to high flow nasal cannula . PCCM was consulted. The patient has advanced emphysema and stable scattered pulmonoary nodules in the right middle and upper lobes. 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 Dr. [name] believes that the patient's pulmonary function is in terminal decline. Recommendation is to continue BIPAP as necessary for now and to treat symptoms with opioids and anxiolytics. Palliative care has been consulted. The patient is agreeable to discharge to home with hospice when she has obtained the maximal benefit of her inpatient stay. She continues to require BIPAP intermittently. Her oxygen requirements today are 8-10 L of heated high flow O2 by nasal cannula.  The patient is under palliative care services, and they have been asked again to investigate the initiation of hospice services for this patient. The patient has refused hospice services in the past. I have consulted TOC for SNF placement. It is not anticipated that the patient will be able to be discharged to home as her friend who has been enabling her to stay at home states that she can no longer do so. TOC is seeking SNF  placement.  Consultants   Palliative Care  PCCM  Procedures   None  Antibiotics   Anti-infectives (From admission, onward)   Start     Dose/Rate Route Frequency Ordered Stop   01/20/19 1330  doxycycline (VIBRA-TABS) tablet 100 mg     100 mg Oral Every 12 hours 01/20/19 1325 01/25/19 0959     Subjective  The patient is resting quietly in bed. No new complaints.   Objective   Vitals:  Vitals:   01/31/19 0715 01/31/19 1214  BP:  103/73  Pulse:  88  Resp:  18  Temp:  98 F (36.7 C)  SpO2: 99% 97%   Exam:  Constitutional:   The patient is awake, alert, and oriented x 3. No acute distress. Respiratory:   No increased work of breathing.  No wheezes, rales, or rhonchi  No tactile fremitus Cardiovascular:   Regular rate and rhythm  No murmurs, ectopy, or gallups.  No lateral PMI. No thrills. Abdomen:   Abdomen is soft, non-tender, non-distended  No hernias, masses, or organomegaly  Normoactive bowel sounds.  Musculoskeletal:   No cyanosis, clubbing, or edema Skin:   No rashes, lesions, ulcers  palpation of skin: no induration or nodules Neurologic:   CN 2-12 intact  Sensation all 4 extremities intact Psychiatric:   Mental status o Mood, affect appropriate o Orientation to person, place, time   judgment and insight appear intact  I have personally reviewed the following:   Today's Data   Vitals, BMP, CBC  Imaging   CXR: Underlying emphysematous change. Apparent loculated left pleural effusion with patchy left base airspace disease. Suspect a degree of underlying interstitial edema.   Scheduled Meds:  acetaZOLAMIDE  250 mg Oral BID   arformoterol  15 mcg Nebulization BID   budesonide (PULMICORT) nebulizer solution  0.25 mg Nebulization BID   chlorhexidine gluconate (MEDLINE KIT)  15 mL Mouth Rinse BID   enoxaparin (LOVENOX) injection  40 mg Subcutaneous Q24H   fluticasone  1 spray Each Nare Daily   furosemide  40 mg Oral  Daily   guaiFENesin  600 mg Oral BID   insulin aspart  0-20 Units Subcutaneous TID WC   insulin aspart  0-5 Units Subcutaneous QHS   insulin aspart  3 Units Subcutaneous TID WC   insulin glargine  45 Units Subcutaneous QHS   ipratropium-albuterol  3 mL Nebulization TID   iron polysaccharides  150 mg Oral Daily   pantoprazole  40 mg Oral Daily   PARoxetine  40 mg Oral q morning - 10a   predniSONE  40 mg Oral BID WC   umeclidinium bromide  1 puff Inhalation Daily   Continuous Infusions:  sodium chloride 500 mL (01/21/19 2220)    Active Problems:   COPD (chronic obstructive pulmonary disease) (HCC)   Chronic respiratory failure (HCC)   COPD exacerbation (HCC)   LOS: 11 days   A & P  Acute on Chronic Hypoxic and Hypercapnic respiratory failure: This isdue to COPD exacerbation and bronchiectasis possible concomitant acute on chronic diastolic CHF exacerbation, improving slowly. The patient was admitted to the progressive unit inpatient given her use of BiPAP. She had to be placed on a nonrebreather and she desaturated down to 70% when it was taken off; she has been weaned off of it and is on 8 to 9 L of high flow nasal cannula today. She is saturating in the high nineties on 8-9 L of high flow O2. She wears 6L O2 by nasal cannula at baseline for saturations in the low to mid 90's. O2 will be titrated to  91 to 93%.COVID-19 negative. Pt will need BIPAP for sleep upon discharge to home. PCCM has bveen consulted and has consulted palliative care in turn as they believe that the patient's respiratory situation is in a terminal decline. Palliative care has had a discussion with the patient and her family. She is in agreement with discharge to home with hospice when she has reached maximal benefit from her inpatient stay. She is currently receiving oral doxycycline, Brovana, albuterol, pulmicort, incruse ellipta, and duonebs. She is receiving Iv solumedrol bid. Will continue to wean  this. She is also receiving IV Lasix, acetazolamide. Respiratory Virus panel is negative as is COVID-19 testing.  Loculated pleural effusion: Consider VATS, although it seems that, given the patient's comorbidities and state of "terminal decline", as stated by pulmonology, this would not be in the patient's best interests.   Poorly Controlled Diabetes Mellitus Type 2: This patient is receiving Lantus 20 units daily, and she is on a resistant sliding scale. Glucoses are very difficult to control  We will reduce home Lantus dosing from 25 units to 20 units. The patient's glucoses have run from 253 to 398 today. This is likely due to steroids. This has recently been deescalated to 60 mg bid. Continue to wean as possible. Will increase dose of lantus. Recent hemoglobin A1c done on 11/02/2018 was 9.4. Diabetes education coordinator has been consulted for further evaluation recommendations.  Chronic Microcytic Anemia: Pt's hemoglobin initially dropped from 11.9 to 8.6.. Today hemoglobin is stable at 9.6. Anemia is multifactorial including iron deficiency, and anemia of chronic disease. Monitor. FOBT is pending.   Acute on Chronic Diastolic CHF: The patient has been receiving  lasix for volume overload. She is currently 5.786 L negative. Volume status, electrolytes, and creatinine will be monitored.   Pulmonary nodules,bilateral lower and mediastinal lymphadenosis/mass: Uunknown etiology,according to Pulm/Critical Care note in October; he is Dr. Fuller Plan. 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 She is currently maintained on Trelegy Ellipta and 20 mg of p.o. prednisone daily. PCCM recommends hospice as the patient is felt to be terminal.  Hypernatremia: Resolved.   Hypokalemia: Supplement and monitor.   Deconditioning: PT/OT is recommending SNF, but she will likely discharge to home with hospice.   Transaminitis: Resolved.  Monitor.   Leukocytosis: Continued and likely secondary to steroids. Monitor. 12.2 today.  Anxiety/Generalized Anxiety Disorder: Continue with anxiolytics at the reduced dose with alprazolam 0.25 mg p.o. 3 times daily as needed for anxiety  Obesity: BM I is 37.95. Complicates all cares.  I have seen and examined this patient myself. I have spent 30 minutes in her evaluation and care.  DVT prophylaxis: Enoxaparin 40 mg sq q24h Code Status: Partial Code : DNI Family Communication: No family present at bedside  Disposition Plan: Pt to discharge to home vs SNF when maximum benefit of inpatient stay is attained. Palliative care in place. She will likely need to discharge to SNF with hospice. She wishes to speak with social work regarding options.  Emett Stapel, DO [facility] Direct contact: see [email or web address]  7PM-7AM contact night coverage as above 01/31/2019, 1:12 PM  LOS: 5 days

## 2019-01-31 NOTE — TOC Progression Note (Signed)
Transition of Care [facility]) - Progression Note    Patient Details  Name: [name] MRN: [id number] Date of Birth: [birth year]  Transition of Care [facility]) CM/SW [place], [place] Phone Number: 01/31/2019, 1:02 PM  Clinical Narrative:     CSW visit with the patient at bedside. Patient expressed she really wants to go home but was willing to go to SNF. CSW explained it was her decision to decide SNF verses Home but a disposition needs to be in place. Patient states she was agreeable to return to [facility]. CSW contacted [facility] no response.  CSW informed patient of bed offers:  [facility]- no availability [facility] - will review and respond on Monday [facility] - no availability- f/u on Monday  CSW advised patient, waiting on response/confirmation of bed offers. Patient states she understands.   CSW will continue to follow and assist with discharge planning.   Thurmond Butts, MSW, [place] Clinical Social Worker   Expected Discharge Plan: Home w Hospice Care Barriers to Discharge: Continued Medical Work up  Expected Discharge Plan and Services Expected Discharge Plan: [place] In-house Referral: Clinical Social Work Discharge Planning Services: CM Consult Post Acute Care Choice: Hospice Living arrangements for the past 2 months: [place]: Hospice and [place] Date [place]: 01/25/19 Time HH Agency Contacted: 1300 Representative spoke with at [place]: [place] ([place]) Interventions    Readmission Risk Interventions Readmission Risk Prevention Plan 11/06/2018 11/05/2018  Transportation Screening Complete Complete  PCP or Specialist Appt within 3-5 Days Complete -  HRI or [place] Complete Complete  Social Work Consult for [place] Planning/Counseling Complete Complete  Palliative Care Screening Complete  Complete  Medication Review Press photographer) Complete Complete  Some recent data might be hidden

## 2019-01-31 NOTE — Progress Notes (Signed)
RT placed patient on BIPAP HS. Patient tolerating well at this time.  

## 2019-02-01 DIAGNOSIS — R627 Adult failure to thrive: Secondary | ICD-10-CM

## 2019-02-01 LAB — CBC WITH DIFFERENTIAL/PLATELET
Abs Immature Granulocytes: 0.39 10*3/uL — ABNORMAL HIGH (ref 0.00–0.07)
Basophils Absolute: 0 10*3/uL (ref 0.0–0.1)
Basophils Relative: 0 %
Eosinophils Absolute: 0 10*3/uL (ref 0.0–0.5)
Eosinophils Relative: 0 %
HCT: 40.6 % (ref 36.0–46.0)
Hemoglobin: 10.6 g/dL — ABNORMAL LOW (ref 12.0–15.0)
Immature Granulocytes: 2 %
Lymphocytes Relative: 4 %
Lymphs Abs: 0.9 10*3/uL (ref 0.7–4.0)
MCH: 19.4 pg — ABNORMAL LOW (ref 26.0–34.0)
MCHC: 26.1 g/dL — ABNORMAL LOW (ref 30.0–36.0)
MCV: 74.4 fL — ABNORMAL LOW (ref 80.0–100.0)
Monocytes Absolute: 1.3 10*3/uL — ABNORMAL HIGH (ref 0.1–1.0)
Monocytes Relative: 7 %
Neutro Abs: 17.7 10*3/uL — ABNORMAL HIGH (ref 1.7–7.7)
Neutrophils Relative %: 87 %
Platelets: 420 10*3/uL — ABNORMAL HIGH (ref 150–400)
RBC: 5.46 MIL/uL — ABNORMAL HIGH (ref 3.87–5.11)
RDW: 22.6 % — ABNORMAL HIGH (ref 11.5–15.5)
WBC: 20.4 10*3/uL — ABNORMAL HIGH (ref 4.0–10.5)
nRBC: 0.1 % (ref 0.0–0.2)

## 2019-02-01 LAB — URINE CULTURE: Culture: >=100000 — AB

## 2019-02-01 LAB — BASIC METABOLIC PANEL
Anion gap: 8 (ref 5–15)
BUN: 30 mg/dL — ABNORMAL HIGH (ref 8–23)
CO2: 27 mmol/L (ref 22–32)
Calcium: 9 mg/dL (ref 8.9–10.3)
Chloride: 103 mmol/L (ref 98–111)
Creatinine, Ser: 1.02 mg/dL — ABNORMAL HIGH (ref 0.44–1.00)
GFR calc Af Amer: >60 mL/min (ref >60)
GFR calc non Af Amer: 55 mL/min — ABNORMAL LOW (ref >60)
Glucose, Bld: 272 mg/dL — ABNORMAL HIGH (ref 70–99)
Potassium: 3.9 mmol/L (ref 3.5–5.1)
Sodium: 138 mmol/L (ref 135–145)

## 2019-02-01 LAB — GLUCOSE, CAPILLARY
Glucose-Capillary: 307 mg/dL — ABNORMAL HIGH (ref 70–99)
Glucose-Capillary: 237 mg/dL — ABNORMAL HIGH (ref 70–99)
Glucose-Capillary: 421 mg/dL — ABNORMAL HIGH (ref 70–99)
Glucose-Capillary: 379 mg/dL — ABNORMAL HIGH (ref 70–99)

## 2019-02-01 LAB — SARS CORONAVIRUS 2 (TAT 6-24 HRS): SARS Coronavirus 2: NEGATIVE

## 2019-02-01 MED ORDER — INSULIN GLARGINE 100 UNIT/ML ~~LOC~~ SOLN
20.0000 [IU] | Freq: Once | SUBCUTANEOUS | Status: AC
  Administered 2019-02-01: 20 [IU] via SUBCUTANEOUS
  Filled 2019-02-01: qty 0.2

## 2019-02-01 NOTE — Progress Notes (Signed)
Inpatient Diabetes Program Recommendations  AACE/ADA: New Consensus Statement on Inpatient Glycemic Control   Target Ranges:  Prepandial:   less than 140 mg/dL      Peak postprandial:   less than 180 mg/dL (1-2 hours)      Critically ill patients:  140 - 180 mg/dL   Results for ORFELINDA, SANOW (MRN [id number]) as of 02/01/2019 11:32  Ref. Range 01/31/2019 06:19 01/31/2019 12:12 01/31/2019 15:52 01/31/2019 21:40 02/01/2019 06:18 02/01/2019 11:22  Glucose-Capillary Latest Ref Range: 70 - 99 mg/dL 283 (H) 269 (H) 277 (H) 207 (H) 307 (H) 237 (H)   Review of Glycemic Control  Diabetes history: DM2 Outpatient Diabetes medications: Lantus 32 units QHS Current orders for Inpatient glycemic control: Lantus 45 units QHS, Novolog 0-20 units TID with meals, Novolog 0-5 units QHS, Novolog 3 units TID with meals; Prednisone 40 mg BID  Inpatient Diabetes Program Recommendations:   Insulin - Basal: If steroids are continued as ordered, please consider increasing Lantus to 50 units QHS.  Insulin - Meal Coverage: If steroids are continued, please consider increasing meal coverage to Novolog 8 units TID with meals if patient eats at least 50% of meals.  Thanks, Barnie Alderman, RN, MSN, CDE Diabetes Coordinator Inpatient Diabetes Program [phone] (Team Pager from 8am to 5pm)

## 2019-02-01 NOTE — Progress Notes (Signed)
Patient ID: [name], female   DOB: Aug 12, 1945, 74 y.o.   MRN: [id number]  This NP visited patient at the bedside as a follow up to initial  [place] for palliative medicine needs and emotional support.  Patient is weak and intermittently requiring BiPap.  She is high risk for decompensation 2/2 to underlying co-morbid ites.  Discussed concept of failure to thrive and limitations of medical interventions to prolong quality life when a body fails to trhive  Discussed her increased care needs at home and importance of preparing for her needs and safety.  It seems she has little family/freinds support and when talking with them they express frustration in [name]'s choices and appraoch.   [name] and discussed  self responsibility for her own health and wellness and choices and consequences.   Again discussed  hospice services in the home vs SNF for rehab.   She is now leaning toward SNF.    Discussed with patient the importance of continued conversation with her medical providers regarding overall plan of care and treatment options,  ensuring decisions are within the context of the patients values and GOCs.  Recommend OP Palliative Community based services   Questions and concerns addressed     Discussed with Dr. Benny Lennert  Total time spent on the unit was 35 minutes  Greater than 50% of the time was spent in counseling and coordination of care  Wadie Lessen NP  Palliative Medicine Team Team Phone # [phone] Pager [phone]

## 2019-02-01 NOTE — TOC Progression Note (Signed)
Transition of Care [facility]) - Progression Note    Patient Details  Name: Tonnia Mcelroy MRN: [id number] Date of Birth: 1945/11/14  [facility] CM/SW [place], [place] Phone Number: 02/01/2019, 2:04 PM  Clinical Narrative:     CSW visit with the patient at bedside. Patient remains agreeable to SNF. CSW informed patient of bed offers availability.  [facility]- not accepting new admissions- [place] has no availability  [facility] - offered placement   CSW informed [facility] of discharge plan- CSW waiting on confirmation.  Patient has accepted bed offer with [facility]. RN & MD updated- patient will need covid test  Thurmond Butts, MSW, [place] Clinical Social Worker   Expected Discharge Plan: Home w Hospice Care Barriers to Discharge: Continued Medical Work up  Expected Discharge Plan and Services Expected Discharge Plan: [place] In-house Referral: Clinical Social Work Discharge Planning Services: CM Consult Post Acute Care Choice: Hospice Living arrangements for the past 2 months: [place]: Hospice and [place] Date [place]: 01/25/19 Time HH Agency Contacted: 1300 Representative spoke with at [place]: [place] ([place]) Interventions    Readmission Risk Interventions Readmission Risk Prevention Plan 11/06/2018 11/05/2018  Transportation Screening Complete Complete  PCP or Specialist Appt within 3-5 Days Complete -  HRI or [place] Complete Complete  Social Work Consult for [place] Planning/Counseling Complete Complete  Palliative Care Screening Complete Complete  Medication Review Press photographer) Complete Complete  Some recent data might be hidden

## 2019-02-01 NOTE — Progress Notes (Signed)
Occupational Therapy Treatment Patient Details Name: [name] MRN: [id number] DOB: [birth year] Today's Date: 02/01/2019    History of present illness 74 y.o. female with medical history significant of COPD baseline 6 L of oxygen at all times, bronchiectasis, diastolic CHF, HLD, HTN, mediastinal mass, CT, pulmonary nodules, Tobacco abuse psoriasis, GERD, diabetes, presented with increasing short of breath since yesterday, with worsening of wheezing and occasional dry cough.   OT comments  Patient semi-supine in bed upon arrival, agreeable to OT. Patient supervision level for bed mobility, functional transfers and ambulation to/from sink however requires increased time for transitional movements due to labored breathing through mouth. Max verbal cues provided for pursed lip breathing during self care tasks, patient maintain 88-94% on 5L O2. Patient did not require physical assistance for sink side grooming and toileting however has decreased activity tolerance requiring increased time to complete basics ADLs. Patient expresses desire to go home vs rehab facility, educate patient will need to increase activity tolerance in order to safely take care of herself in order to return home.     Follow Up Recommendations  SNF    Equipment Recommendations  Other (comment)(defer to next venue)       Precautions / Restrictions Precautions Precautions: Fall Precaution Comments: watch 02 Restrictions Weight Bearing Restrictions: No       Mobility Bed Mobility Overal bed mobility: Needs Assistance Bed Mobility: Supine to Sit;Sit to Supine     Supine to sit: Supervision;HOB elevated Sit to supine: Supervision   General bed mobility comments: patient requires increased time to rest after supine to sit, O2 maintain in high 80s on 5L  Transfers Overall transfer level: Needs assistance Equipment used: None Transfers: Sit to/from Stand Sit to Stand: Supervision         General transfer  comment: no physical assist to power up to standing, verbal cues for PLB    Balance Overall balance assessment: Needs assistance Sitting-balance support: No upper extremity supported;Feet supported Sitting balance-Leahy Scale: Good Sitting balance - Comments: supervision   Standing balance support: During functional activity Standing balance-Leahy Scale: Good Standing balance comment: supervision                           ADL either performed or assessed with clinical judgement   ADL Overall ADL's : Needs assistance/impaired Eating/Feeding: Independent;Sitting Eating/Feeding Details (indicate cue type and reason): drinking from cup Grooming: Wash/dry hands;Wash/dry face;Oral care;Supervision/safety;Sitting Grooming Details (indicate cue type and reason): patient requires increased time to complete seated g/h                 Toilet Transfer: Supervision/safety;BSC;Ambulation Armed forces technical officer Details (indicate cue type and reason): patient able to take few steps to bedside commode, reaches for commode arms when turning to sit. verbal cues for pursed lip breathing with limited carry over Toileting- Clothing Manipulation and Hygiene: Supervision/safety;Sitting/lateral lean Toileting - Clothing Manipulation Details (indicate cue type and reason): peri care post bowel movement, requires increased time     Functional mobility during ADLs: Supervision/safety General ADL Comments: decreased activity tolerance, patient breaths heavily through her mouth requiring verbal cues for pursed lip breathing throughout session.               Cognition Arousal/Alertness: Awake/alert Behavior During Therapy: WFL for tasks assessed/performed Overall Cognitive Status: Within Functional Limits for tasks assessed  General Comments patient on 5L O2 throughout, fluctuate between 88-94% with verbal cues for pursed lip  breathing    Pertinent Vitals/ Pain       Pain Assessment: No/denies pain         Frequency  Min 2X/week        Progress Toward Goals  OT Goals(current goals can now be found in the care plan section)  Progress towards OT goals: Progressing toward goals  Acute Rehab OT Goals Patient Stated Goal: to feel better OT Goal Formulation: With patient Time For Goal Achievement: 02/15/19 Potential to Achieve Goals: Good ADL Goals Pt Will Perform Grooming: with modified independence;sitting Pt Will Perform Upper Body Bathing: with supervision;sitting Pt Will Perform Upper Body Dressing: with supervision;sitting Pt Will Transfer to Toilet: with modified independence;ambulating;bedside commode Additional ADL Goal #1: Patient will tolerate sitting up for 10 minutes at supervision level while utilizing pursed lip breathing techniques with min cues in order to participate in self care tasks.   Plan Discharge plan remains appropriate       AM-PAC OT "6 Clicks" Daily Activity     Outcome Measure   Help from another person eating meals?: None Help from another person taking care of personal grooming?: A Little Help from another person toileting, which includes using toliet, bedpan, or urinal?: A Little Help from another person bathing (including washing, rinsing, drying)?: A Lot Help from another person to put on and taking off regular upper body clothing?: A Little Help from another person to put on and taking off regular lower body clothing?: A Lot 6 Click Score: 17    End of Session Equipment Utilized During Treatment: Oxygen  OT Visit Diagnosis: Muscle weakness (generalized) (M62.81)   Activity Tolerance Patient tolerated treatment well   Patient Left in bed;with call bell/phone within reach   Nurse Communication Mobility status        Time: [id number] OT Time Calculation (min): 39 min  Charges: OT General Charges $OT Visit: 1 Visit OT Treatments $Self Care/Home  Management : 38-52 mins  Shon Millet OT OT office: [place] 02/01/2019, 12:56 PM

## 2019-02-01 NOTE — Progress Notes (Signed)
CRITICAL VALUE ALERT  Critical Value:  CBG 421  Date & Time Notied:  02/01/2019 @ [id number]  Provider Notified: Swayze Ava  Orders Received/Actions taken: awaiting new order from MD.

## 2019-02-01 NOTE — Progress Notes (Signed)
PROGRESS NOTE  [name] [id number] DOB: [birth year] DOA: 01/20/2019 PCP: Binnie Rail, MD  Brief History   Fatin Ndiayeis a 74 y.o.femalewith medical history significant ofCOPDbaseline6 L of oxygen at all times, bronchiectasis, diastolic CHF, HLD, HTN, mediastinal mass, CT, pulmonary nodules, Tobacco abuse psoriasis, GERD, diabetes, presented withincreasing short of breathsince yesterday,with worsening of wheezing and occasional drycough no fevers no chills no chest pain no leg edema shedenied any Covid contact.Denied any muscle ache night sweat,dysuria diarrhea.ABG showed mixed hypoxia and hypercapnia,received Solu-Medrol and bronchodilator.  The patient required BIPAP and then was transitioned to high flow nasal cannula . PCCM was consulted. The patient has advanced emphysema and stable scattered pulmonoary nodules in the right middle and upper lobes. 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 Dr. [name] believes that the patient's pulmonary function is in terminal decline. Recommendation is to continue BIPAP as necessary for now and to treat symptoms with opioids and anxiolytics. Palliative care has been consulted. The patient is agreeable to discharge to home with hospice when she has obtained the maximal benefit of her inpatient stay. She continues to require BIPAP intermittently. Her oxygen requirements today are 8-10 L of heated high flow O2 by nasal cannula.  The patient is under palliative care services, and they have been asked again to investigate the initiation of hospice services for this patient. The patient has refused hospice services in the past. I have consulted TOC for SNF placement. It is not anticipated that the patient will be able to be discharged to home as her friend who has been enabling her to stay at home states that she can no longer do so. TOC is seeking SNF  placement.  Consultants   Palliative Care  PCCM  Procedures   None  Antibiotics   Anti-infectives (From admission, onward)   Start     Dose/Rate Route Frequency Ordered Stop   01/20/19 1330  doxycycline (VIBRA-TABS) tablet 100 mg     100 mg Oral Every 12 hours 01/20/19 1325 01/25/19 0959     Subjective  The patient is resting quietly in bed. No new complaints.   Objective   Vitals:  Vitals:   02/01/19 0725 02/01/19 0840  BP: (!) 98/50   Pulse: 90 95  Resp: 17 19  Temp: 98.3 F (36.8 C)   SpO2: 97%    Exam:  Constitutional:   The patient is awake, alert, and oriented x 3. No acute distress. Respiratory:   No increased work of breathing.  No wheezes, rales, or rhonchi  No tactile fremitus Cardiovascular:   Regular rate and rhythm  No murmurs, ectopy, or gallups.  No lateral PMI. No thrills. Abdomen:   Abdomen is soft, non-tender, non-distended  No hernias, masses, or organomegaly  Normoactive bowel sounds.  Musculoskeletal:   No cyanosis, clubbing, or edema Skin:   No rashes, lesions, ulcers  palpation of skin: no induration or nodules Neurologic:   CN 2-12 intact  Sensation all 4 extremities intact Psychiatric:   Mental status o Mood, affect appropriate o Orientation to person, place, time   judgment and insight appear intact  I have personally reviewed the following:   Today's Data   Vitals, BMP, CBC  Imaging   CXR: Underlying emphysematous change. Apparent loculated left pleural effusion with patchy left base airspace disease. Suspect a degree of underlying interstitial edema.   Scheduled Meds:  acetaZOLAMIDE  250 mg Oral BID   arformoterol  15 mcg Nebulization BID   budesonide (PULMICORT) nebulizer solution  0.25 mg Nebulization BID   chlorhexidine gluconate (MEDLINE KIT)  15 mL Mouth Rinse BID   enoxaparin (LOVENOX) injection  40 mg Subcutaneous Q24H   fluticasone  1 spray Each Nare Daily   furosemide  40 mg  Oral Daily   guaiFENesin  600 mg Oral BID   insulin aspart  0-20 Units Subcutaneous TID WC   insulin aspart  0-5 Units Subcutaneous QHS   insulin aspart  3 Units Subcutaneous TID WC   insulin glargine  45 Units Subcutaneous QHS   iron polysaccharides  150 mg Oral Daily   pantoprazole  40 mg Oral Daily   PARoxetine  40 mg Oral q morning - 10a   predniSONE  40 mg Oral BID WC   umeclidinium bromide  1 puff Inhalation Daily   Continuous Infusions:  sodium chloride 500 mL (01/21/19 2220)    Active Problems:   COPD (chronic obstructive pulmonary disease) (HCC)   Chronic respiratory failure (HCC)   COPD exacerbation (HCC)   LOS: 12 days   A & P  Acute on Chronic Hypoxic and Hypercapnic respiratory failure: This isdue to COPD exacerbation and bronchiectasis possible concomitant acute on chronic diastolic CHF exacerbation, improving slowly. The patient was admitted to the progressive unit inpatient given her use of BiPAP. She had to be placed on a nonrebreather and she desaturated down to 70% when it was taken off; she has been weaned off of it and is on 8 to 9 L of high flow nasal cannula today. She is saturating in the high nineties on 8-9 L of high flow O2. She wears 6L O2 by nasal cannula at baseline for saturations in the low to mid 90's. O2 will be titrated to  91 to 93%.COVID-19 negative. Pt will need BIPAP for sleep upon discharge to home. PCCM has bveen consulted and has consulted palliative care in turn as they believe that the patient's respiratory situation is in a terminal decline. Palliative care has had a discussion with the patient and her family. She is in agreement with discharge to home with hospice when she has reached maximal benefit from her inpatient stay. She is currently receiving oral doxycycline, Brovana, albuterol, pulmicort, incruse ellipta, and duonebs. She is receiving Iv solumedrol bid. Will continue to wean this. She is also receiving IV Lasix,  acetazolamide. Respiratory Virus panel is negative as is COVID-19 testing.  Loculated pleural effusion: Consider VATS, although it seems that, given the patient's comorbidities and state of "terminal decline", as stated by pulmonology, this would not be in the patient's best interests.   Poorly Controlled Diabetes Mellitus Type 2: This patient is receiving Lantus 20 units daily, and she is on a resistant sliding scale. Glucoses are very difficult to control  We will reduce home Lantus dosing from 25 units to 20 units. The patient's glucoses have run from 253 to 398 today. This is likely due to steroids. This has recently been deescalated to 60 mg bid. Continue to wean as possible. Will increase dose of lantus. Recent hemoglobin A1c done on 11/02/2018 was 9.4. Diabetes education coordinator has been consulted for further evaluation recommendations.  Chronic Microcytic Anemia: Pt's hemoglobin initially dropped from 11.9 to 8.6.. Today hemoglobin is stable at 9.6. Anemia is multifactorial including iron deficiency, and anemia of chronic disease. Monitor. FOBT is pending.   Acute on Chronic Diastolic CHF: The patient has been receiving lasix for volume overload. She is currently 5.786  L negative. Volume status, electrolytes, and creatinine will be monitored.   Pulmonary nodules,bilateral lower and mediastinal lymphadenosis/mass: Uunknown etiology,according to Pulm/Critical Care note in October; he is Dr. Fuller Plan. 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 She is currently maintained on Trelegy Ellipta and 20 mg of p.o. prednisone daily. PCCM recommends hospice as the patient is felt to be terminal.  Hypernatremia: Resolved.   Hypokalemia: Supplement and monitor.   Deconditioning: PT/OT is recommending SNF, but she will likely discharge to home with hospice.   Transaminitis: Resolved. Monitor.   Leukocytosis: Continued  and likely secondary to steroids. Monitor. 12.2 today.  Anxiety/Generalized Anxiety Disorder: Continue with anxiolytics at the reduced dose with alprazolam 0.25 mg p.o. 3 times daily as needed for anxiety  Obesity: BM I is 37.95. Complicates all cares.  I have seen and examined this patient myself. I have spent 30 minutes in her evaluation and care.  DVT prophylaxis: Enoxaparin 40 mg sq q24h Code Status: Partial Code : DNI Family Communication: No family present at bedside  Disposition Plan: Awaiting SNf placement. The assistance of Transitions of Care is greatly appreciated.  Kolter Reaver, DO [facility] Direct contact: see [email or web address]  7PM-7AM contact night coverage as above 02/01/2019, 2:27 PM  LOS: 5 days

## 2019-02-02 ENCOUNTER — Telehealth: Payer: Self-pay | Admitting: *Deleted

## 2019-02-02 DIAGNOSIS — J441 Chronic obstructive pulmonary disease with (acute) exacerbation: Secondary | ICD-10-CM | POA: Diagnosis present

## 2019-02-02 DIAGNOSIS — R069 Unspecified abnormalities of breathing: Secondary | ICD-10-CM | POA: Diagnosis not present

## 2019-02-02 DIAGNOSIS — F411 Generalized anxiety disorder: Secondary | ICD-10-CM | POA: Diagnosis present

## 2019-02-02 DIAGNOSIS — Z91048 Other nonmedicinal substance allergy status: Secondary | ICD-10-CM | POA: Diagnosis not present

## 2019-02-02 DIAGNOSIS — I509 Heart failure, unspecified: Secondary | ICD-10-CM | POA: Diagnosis not present

## 2019-02-02 DIAGNOSIS — J9611 Chronic respiratory failure with hypoxia: Secondary | ICD-10-CM | POA: Diagnosis not present

## 2019-02-02 DIAGNOSIS — J449 Chronic obstructive pulmonary disease, unspecified: Secondary | ICD-10-CM | POA: Diagnosis not present

## 2019-02-02 DIAGNOSIS — Z7952 Long term (current) use of systemic steroids: Secondary | ICD-10-CM | POA: Diagnosis not present

## 2019-02-02 DIAGNOSIS — J9622 Acute and chronic respiratory failure with hypercapnia: Secondary | ICD-10-CM | POA: Diagnosis not present

## 2019-02-02 DIAGNOSIS — E1165 Type 2 diabetes mellitus with hyperglycemia: Secondary | ICD-10-CM | POA: Diagnosis present

## 2019-02-02 DIAGNOSIS — I517 Cardiomegaly: Secondary | ICD-10-CM | POA: Diagnosis not present

## 2019-02-02 DIAGNOSIS — Z888 Allergy status to other drugs, medicaments and biological substances status: Secondary | ICD-10-CM | POA: Diagnosis not present

## 2019-02-02 DIAGNOSIS — Z6834 Body mass index (BMI) 34.0-34.9, adult: Secondary | ICD-10-CM | POA: Diagnosis not present

## 2019-02-02 DIAGNOSIS — Z7951 Long term (current) use of inhaled steroids: Secondary | ICD-10-CM | POA: Diagnosis not present

## 2019-02-02 DIAGNOSIS — Z743 Need for continuous supervision: Secondary | ICD-10-CM | POA: Diagnosis not present

## 2019-02-02 DIAGNOSIS — I959 Hypotension, unspecified: Secondary | ICD-10-CM | POA: Diagnosis present

## 2019-02-02 DIAGNOSIS — J440 Chronic obstructive pulmonary disease with acute lower respiratory infection: Secondary | ICD-10-CM | POA: Diagnosis present

## 2019-02-02 DIAGNOSIS — E1100 Type 2 diabetes mellitus with hyperosmolarity without nonketotic hyperglycemic-hyperosmolar coma (NKHHC): Secondary | ICD-10-CM | POA: Diagnosis not present

## 2019-02-02 DIAGNOSIS — J9600 Acute respiratory failure, unspecified whether with hypoxia or hypercapnia: Secondary | ICD-10-CM | POA: Diagnosis not present

## 2019-02-02 DIAGNOSIS — F17210 Nicotine dependence, cigarettes, uncomplicated: Secondary | ICD-10-CM | POA: Diagnosis present

## 2019-02-02 DIAGNOSIS — F329 Major depressive disorder, single episode, unspecified: Secondary | ICD-10-CM | POA: Diagnosis present

## 2019-02-02 DIAGNOSIS — R279 Unspecified lack of coordination: Secondary | ICD-10-CM | POA: Diagnosis not present

## 2019-02-02 DIAGNOSIS — G4733 Obstructive sleep apnea (adult) (pediatric): Secondary | ICD-10-CM

## 2019-02-02 DIAGNOSIS — Z79899 Other long term (current) drug therapy: Secondary | ICD-10-CM | POA: Diagnosis not present

## 2019-02-02 DIAGNOSIS — I491 Atrial premature depolarization: Secondary | ICD-10-CM | POA: Diagnosis not present

## 2019-02-02 DIAGNOSIS — J189 Pneumonia, unspecified organism: Secondary | ICD-10-CM | POA: Diagnosis not present

## 2019-02-02 DIAGNOSIS — J90 Pleural effusion, not elsewhere classified: Secondary | ICD-10-CM | POA: Diagnosis not present

## 2019-02-02 DIAGNOSIS — Z794 Long term (current) use of insulin: Secondary | ICD-10-CM | POA: Diagnosis not present

## 2019-02-02 DIAGNOSIS — R000 Tachycardia, unspecified: Secondary | ICD-10-CM | POA: Diagnosis not present

## 2019-02-02 DIAGNOSIS — R0689 Other abnormalities of breathing: Secondary | ICD-10-CM | POA: Diagnosis not present

## 2019-02-02 DIAGNOSIS — Z66 Do not resuscitate: Secondary | ICD-10-CM | POA: Diagnosis present

## 2019-02-02 DIAGNOSIS — Z7189 Other specified counseling: Secondary | ICD-10-CM | POA: Diagnosis not present

## 2019-02-02 DIAGNOSIS — Q2546 Tortuous aortic arch: Secondary | ICD-10-CM | POA: Diagnosis not present

## 2019-02-02 DIAGNOSIS — I110 Hypertensive heart disease with heart failure: Secondary | ICD-10-CM | POA: Diagnosis present

## 2019-02-02 DIAGNOSIS — J9621 Acute and chronic respiratory failure with hypoxia: Secondary | ICD-10-CM | POA: Diagnosis not present

## 2019-02-02 DIAGNOSIS — Z515 Encounter for palliative care: Secondary | ICD-10-CM | POA: Diagnosis not present

## 2019-02-02 DIAGNOSIS — Z659 Problem related to unspecified psychosocial circumstances: Secondary | ICD-10-CM | POA: Diagnosis not present

## 2019-02-02 DIAGNOSIS — R0902 Hypoxemia: Secondary | ICD-10-CM | POA: Diagnosis not present

## 2019-02-02 DIAGNOSIS — I5032 Chronic diastolic (congestive) heart failure: Secondary | ICD-10-CM | POA: Diagnosis present

## 2019-02-02 DIAGNOSIS — Z20822 Contact with and (suspected) exposure to covid-19: Secondary | ICD-10-CM | POA: Diagnosis present

## 2019-02-02 DIAGNOSIS — E785 Hyperlipidemia, unspecified: Secondary | ICD-10-CM | POA: Diagnosis present

## 2019-02-02 DIAGNOSIS — J9811 Atelectasis: Secondary | ICD-10-CM | POA: Diagnosis present

## 2019-02-02 DIAGNOSIS — R5381 Other malaise: Secondary | ICD-10-CM | POA: Diagnosis not present

## 2019-02-02 DIAGNOSIS — Z8541 Personal history of malignant neoplasm of cervix uteri: Secondary | ICD-10-CM | POA: Diagnosis not present

## 2019-02-02 DIAGNOSIS — I10 Essential (primary) hypertension: Secondary | ICD-10-CM | POA: Diagnosis not present

## 2019-02-02 DIAGNOSIS — R0602 Shortness of breath: Secondary | ICD-10-CM | POA: Diagnosis not present

## 2019-02-02 DIAGNOSIS — Y95 Nosocomial condition: Secondary | ICD-10-CM | POA: Diagnosis present

## 2019-02-02 DIAGNOSIS — M6281 Muscle weakness (generalized): Secondary | ICD-10-CM | POA: Diagnosis not present

## 2019-02-02 DIAGNOSIS — R52 Pain, unspecified: Secondary | ICD-10-CM | POA: Diagnosis not present

## 2019-02-02 DIAGNOSIS — F419 Anxiety disorder, unspecified: Secondary | ICD-10-CM | POA: Diagnosis not present

## 2019-02-02 DIAGNOSIS — R5382 Chronic fatigue, unspecified: Secondary | ICD-10-CM | POA: Diagnosis not present

## 2019-02-02 LAB — CBC WITH DIFFERENTIAL/PLATELET
Abs Immature Granulocytes: 0.99 10*3/uL — ABNORMAL HIGH (ref 0.00–0.07)
Basophils Absolute: 0.1 10*3/uL (ref 0.0–0.1)
Basophils Relative: 1 %
Eosinophils Absolute: 0 10*3/uL (ref 0.0–0.5)
Eosinophils Relative: 0 %
HCT: 41.3 % (ref 36.0–46.0)
Hemoglobin: 10.9 g/dL — ABNORMAL LOW (ref 12.0–15.0)
Immature Granulocytes: 4 %
Lymphocytes Relative: 4 %
Lymphs Abs: 1 10*3/uL (ref 0.7–4.0)
MCH: 19.5 pg — ABNORMAL LOW (ref 26.0–34.0)
MCHC: 26.4 g/dL — ABNORMAL LOW (ref 30.0–36.0)
MCV: 73.8 fL — ABNORMAL LOW (ref 80.0–100.0)
Monocytes Absolute: 1.3 10*3/uL — ABNORMAL HIGH (ref 0.1–1.0)
Monocytes Relative: 5 %
Neutro Abs: 21 10*3/uL — ABNORMAL HIGH (ref 1.7–7.7)
Neutrophils Relative %: 86 %
Platelets: 264 10*3/uL (ref 150–400)
RBC: 5.6 MIL/uL — ABNORMAL HIGH (ref 3.87–5.11)
RDW: 22.7 % — ABNORMAL HIGH (ref 11.5–15.5)
WBC: 24.4 10*3/uL — ABNORMAL HIGH (ref 4.0–10.5)
nRBC: 0.1 % (ref 0.0–0.2)

## 2019-02-02 LAB — GLUCOSE, CAPILLARY: Glucose-Capillary: 302 mg/dL — ABNORMAL HIGH (ref 70–99)

## 2019-02-02 LAB — BASIC METABOLIC PANEL
Anion gap: 10 (ref 5–15)
BUN: 31 mg/dL — ABNORMAL HIGH (ref 8–23)
CO2: 25 mmol/L (ref 22–32)
Calcium: 9.2 mg/dL (ref 8.9–10.3)
Chloride: 104 mmol/L (ref 98–111)
Creatinine, Ser: 0.94 mg/dL (ref 0.44–1.00)
GFR calc Af Amer: >60 mL/min (ref >60)
GFR calc non Af Amer: >60 mL/min (ref >60)
Glucose, Bld: 219 mg/dL — ABNORMAL HIGH (ref 70–99)
Potassium: 4.1 mmol/L (ref 3.5–5.1)
Sodium: 139 mmol/L (ref 135–145)

## 2019-02-02 MED ORDER — ALPRAZOLAM 0.25 MG PO TABS: 0.2500 mg | ORAL_TABLET | Freq: Three times a day (TID) | ORAL | 0 refills | Status: DC | PRN

## 2019-02-02 MED ORDER — INSULIN ASPART 100 UNIT/ML ~~LOC~~ SOLN: 3.0000 [IU] | Freq: Three times a day (TID) | SUBCUTANEOUS | 11 refills | Status: AC

## 2019-02-02 MED ORDER — BUDESONIDE 0.25 MG/2ML IN SUSP: 0.2500 mg | Freq: Two times a day (BID) | RESPIRATORY_TRACT | 12 refills | Status: AC

## 2019-02-02 MED ORDER — PAROXETINE HCL 40 MG PO TABS: 40.0000 mg | ORAL_TABLET | Freq: Every morning | ORAL | 0 refills | Status: AC

## 2019-02-02 MED ORDER — POLYSACCHARIDE IRON COMPLEX 150 MG PO CAPS: 150.0000 mg | ORAL_CAPSULE | Freq: Every day | ORAL | 0 refills | Status: AC

## 2019-02-02 MED ORDER — INSULIN GLARGINE 100 UNIT/ML ~~LOC~~ SOLN: SUBCUTANEOUS | 11 refills | Status: DC

## 2019-02-02 MED ORDER — ACETAMINOPHEN 325 MG PO TABS: 650.0000 mg | ORAL_TABLET | Freq: Four times a day (QID) | ORAL | 0 refills | Status: DC | PRN

## 2019-02-02 MED ORDER — ARFORMOTEROL TARTRATE 15 MCG/2ML IN NEBU: 15.0000 ug | INHALATION_SOLUTION | Freq: Two times a day (BID) | RESPIRATORY_TRACT | 0 refills | Status: AC

## 2019-02-02 MED ORDER — PREDNISONE 20 MG PO TABS: ORAL_TABLET | ORAL | 0 refills | Status: DC

## 2019-02-02 MED ORDER — UMECLIDINIUM BROMIDE 62.5 MCG/INH IN AEPB: 1.0000 | INHALATION_SPRAY | Freq: Every day | RESPIRATORY_TRACT | 0 refills | Status: AC

## 2019-02-02 NOTE — Telephone Encounter (Signed)
Pt was admitted 01/20/19 for Acute on chronic hypoxic and hypercapnic respiratory failure. The patient is under palliative care services, and they have been asked again to investigate the initiation of hospice services for this patient. The patient has refused hospice services in the past. I have consulted TOC for SNF placement. It is not anticipated that the patient will be able to be discharged to home as her friend who has been enabling her to stay at home states that she can no longer do so. TOC is seeking SNF placement. Pt D/C 02/02/19 to SNF w/hospice services...[name]

## 2019-02-02 NOTE — Progress Notes (Signed)
1207: Report given to nurse Randell Patient at [facility].

## 2019-02-02 NOTE — TOC Transition Note (Addendum)
Transition of Care [facility]) - CM/SW Discharge Note   Patient Details  Name: [name] MRN: [id number] Date of Birth: 1945-02-05  Transition of Care [facility]) CM/SW Contact:  Vinie Sill, [place] Phone Number: 02/02/2019, 10:10 AM   Clinical Narrative:     Patient will DC to: [place] Date: 02/02/2019 Family Notified: patient declined contacting family Transport By: Corey Harold- Next available  RN, patient, and facility notified of DC. Discharge Summary sent to facility. RN given number for [phone], Room 123A. Ambulance transport requested for patient.   Clinical Social Worker signing off. Thurmond Butts, MSW, [place] Clinical Social Worker    Final next level of care: Acute to Acute Transfer Barriers to Discharge: Barriers Resolved   Patient Goals and CMS Choice     Choice offered to / list presented to : Patient  Discharge Placement PASRR number recieved: 01/29/19            Patient chooses bed at: [facility] Patient to be transferred to facility by: [place] Name of family member notified: patient declined contacting family Patient and family notified of of transfer: 02/02/19  Discharge Plan and Services In-house Referral: Clinical Social Work Discharge Planning Services: CM Consult Post Acute Care Choice: Hospice                      HH Agency: [facility] [place] Date [place]: 01/25/19 Time HH Agency Contacted: 1300 Representative spoke with at [place]: [place] ([place]) Interventions     Readmission Risk Interventions Readmission Risk Prevention Plan 11/06/2018 11/05/2018  Transportation Screening Complete Complete  PCP or Specialist Appt within 3-5 Days Complete -  HRI or [place] Complete Complete  Social Work Consult for [place] Planning/Counseling Complete Complete  Palliative Care Screening Complete Complete  Medication Review Designer, fashion/clothing) Complete Complete  Some recent data might be hidden

## 2019-02-02 NOTE — Progress Notes (Signed)
Inpatient Diabetes Program Recommendations  AACE/ADA: New Consensus Statement on Inpatient Glycemic Control  Target Ranges:  Prepandial:   less than 140 mg/dL      Peak postprandial:   less than 180 mg/dL (1-2 hours)      Critically ill patients:  140 - 180 mg/dL   Results for [name], [name] (MRN [id number]) as of 02/02/2019 09:18  Ref. Range 02/01/2019 06:18 02/01/2019 11:22 02/01/2019 16:29 02/01/2019 22:11 02/02/2019 06:41  Glucose-Capillary Latest Ref Range: 70 - 99 mg/dL 307 (H) 237 (H) 421 (H) 379 (H) 302 (H)   Review of Glycemic Control  Diabetes history: DM2 Outpatient Diabetes medications: Lantus 32 units QHS Current orders for Inpatient glycemic control: Lantus 45 units QHS, Novolog 0-20 units TID with meals, Novolog 0-5 units QHS, Novolog 3 units TID with meals; Prednisone 40 mg BID  Inpatient Diabetes Program Recommendations:   Insulin - Basal: If steroids are continued as ordered, please consider increasing Lantus to 50 units QHS.  Insulin - Meal Coverage: If steroids are continued, please consider increasing meal coverage to Novolog 8 units TID with meals if patient eats at least 50% of meals.  Thanks, Barnie Alderman, RN, MSN, CDE Diabetes Coordinator Inpatient Diabetes Program [phone] (Team Pager from 8am to 5pm)

## 2019-02-02 NOTE — Plan of Care (Signed)
  Problem: Education: Goal: Knowledge of General Education information will improve Description: Including pain rating scale, medication(s)/side effects and non-pharmacologic comfort measures Outcome: Completed/Met   Problem: Health Behavior/Discharge Planning: Goal: Ability to manage health-related needs will improve Outcome: Completed/Met   Problem: Clinical Measurements: Goal: Ability to maintain clinical measurements within normal limits will improve Outcome: Completed/Met Goal: Will remain free from infection Outcome: Completed/Met Goal: Diagnostic test results will improve Outcome: Completed/Met Goal: Respiratory complications will improve Outcome: Completed/Met Goal: Cardiovascular complication will be avoided Outcome: Completed/Met   Problem: Activity: Goal: Risk for activity intolerance will decrease Outcome: Completed/Met   Problem: Nutrition: Goal: Adequate nutrition will be maintained Outcome: Completed/Met   Problem: Coping: Goal: Level of anxiety will decrease Outcome: Completed/Met   Problem: Elimination: Goal: Will not experience complications related to bowel motility Outcome: Completed/Met Goal: Will not experience complications related to urinary retention Outcome: Completed/Met   Problem: Pain Managment: Goal: General experience of comfort will improve Outcome: Completed/Met   Problem: Safety: Goal: Ability to remain free from injury will improve Outcome: Completed/Met   Problem: Skin Integrity: Goal: Risk for impaired skin integrity will decrease Outcome: Completed/Met   Problem: Education: Goal: Knowledge of disease or condition will improve Outcome: Completed/Met Goal: Knowledge of the prescribed therapeutic regimen will improve Outcome: Completed/Met Goal: Individualized Educational Video(s) Outcome: Completed/Met   Problem: Activity: Goal: Ability to tolerate increased activity will improve Outcome: Completed/Met Goal: Will  verbalize the importance of balancing activity with adequate rest periods Outcome: Completed/Met   Problem: Respiratory: Goal: Ability to maintain a clear airway will improve Outcome: Completed/Met Goal: Levels of oxygenation will improve Outcome: Completed/Met Goal: Ability to maintain adequate ventilation will improve Outcome: Completed/Met   Problem: Metabolic: Goal: Ability to maintain appropriate glucose levels will improve Outcome: Completed/Met   Problem: Tissue Perfusion: Goal: Adequacy of tissue perfusion will improve Outcome: Completed/Met

## 2019-02-02 NOTE — Progress Notes (Signed)
Patient in a stable condition, discharge education reviewed with patient she verbalized understanding, iv removed tele dc ccmd notified patient belongings at bedside, patient transported to [place] by [facility].

## 2019-02-02 NOTE — Discharge Summary (Signed)
Physician Discharge Summary  [name] [id number] DOB: [birth year] DOA: 01/20/2019  PCP: Binnie Rail, MD  Admit date: 01/20/2019 Discharge date: 02/02/2019  Recommendations for Outpatient Follow-up:  1. Discharge to SNF with Hospice/Palliative Care 2. PT/OT at SNF 3. Follow up with PCP in 7-10 days.  Discharge Diagnoses: Principal diagnosis is #1 1. Acute on chronic hypoxic and hypercapnic respiratory failure 2. Pleural Effusion 3. Poorly controlled diabetes mellitus 4. Chronic microcytic anemia 5. Debility 6. Pulmonary nodules 7. Hypernatremia 8. Hypokalemia 9. Leukocytosis 10. Anxiety 11. Morbid obesity  Discharge Condition: Fair  Disposition: SNF with palliative care/hospice  Diet recommendation: Carbohydrate modified  Filed Weights   01/30/19 0436 01/31/19 0658 02/01/19 0500  Weight: 92 kg 91.2 kg 93 kg    History of present illness:  [name] is a 74 y.o. female with medical history significant of COPD baseline 6 L of oxygen at all times, bronchiectasis, diastolic CHF, HLD, HTN, mediastinal mass, CT, pulmonary nodules, Tobacco abuse psoriasis, GERD, diabetes, presented with increasing short of breath since yesterday, with worsening of wheezing and occasional dry cough no fevers no chills no chest pain no leg edema she denied any Covid contact.  Denied any muscle ache night sweat, dysuria diarrhea.  ED Course: ABG showed mixed hypoxia and hypercapnia, received [name] and bronchodilator.  Hospital Course:  [name] a 74 y.o.femalewith medical history significant ofCOPDbaseline6 L of oxygen at all times, bronchiectasis, diastolic CHF, HLD, HTN, mediastinal mass, CT, pulmonary nodules, Tobacco abuse psoriasis, GERD, diabetes, presented withincreasing short of breathsince yesterday,with worsening of wheezing and occasional drycough no fevers no chills no chest pain no leg edema shedenied any Covid contact.Denied any muscle ache night  sweat,dysuria diarrhea.ABG showed mixed hypoxia and hypercapnia,received [name] and bronchodilator.  The patient required BIPAP and then was transitioned to high flow nasal cannula . PCCM was consulted. The patient has advanced emphysema and stable scattered pulmonoary nodules in the right middle and upper lobes. 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 Dr. [name] believes that the patient's pulmonary function is in terminal decline. Recommendation is to continue BIPAP as necessary for now and to treat symptoms with opioids and anxiolytics. Palliative care has been consulted. The patient is agreeable to discharge to home with hospice when she has obtained the maximal benefit of her inpatient stay. She continues to require BIPAP intermittently. Her oxygen requirements today are 8-10 L of heated high flow O2 by nasal cannula.  The patient is under palliative care services, and they have been asked again to investigate the initiation of hospice services for this patient. The patient has refused hospice services in the past. I have consulted TOC for SNF placement. It is not anticipated that the patient will be able to be discharged to home as her friend who has been enabling her to stay at home states that she can no longer do so. TOC is seeking SNF placement.  Today's assessment: S: The patient is resting comfortably. No new complaints. O: Vitals:  Vitals:   02/02/19 0724 02/02/19 0843  BP: 105/63   Pulse:    Resp: 15   Temp: 98.4 F (36.9 C)   SpO2: 100% 100%   Constitutional:   The patient is awake, alert, and oriented x 3. No acute distress. Respiratory:   No increased work of breathing.  No wheezes, rales, or rhonchi  No tactile fremitus Cardiovascular:   Regular rate and rhythm  No murmurs, ectopy,  or gallups.  No lateral PMI. No thrills. Abdomen:   Abdomen is soft, non-tender,  non-distended  No hernias, masses, or organomegaly  Normoactive bowel sounds.  Musculoskeletal:   No cyanosis, clubbing, or edema Skin:   No rashes, lesions, ulcers  palpation of skin: no induration or nodules Neurologic:   CN 2-12 intact  Sensation all 4 extremities intact Psychiatric:   Mental status ? Mood, affect appropriate ? Orientation to person, place, time   judgment and insight appear intact  Discharge Instructions  Discharge Instructions    Activity as tolerated - No restrictions   Complete by: As directed    Amb Referral to Palliative Care   Complete by: As directed    Call MD for:  difficulty breathing, headache or visual disturbances   Complete by: As directed    Diet Carb Modified   Complete by: As directed    Discharge instructions   Complete by: As directed    Discharge to SNF with hospice follow up. PT/OT at SNF Follow up with PCP in 7-10 days   Increase activity slowly   Complete by: As directed      Allergies as of 02/02/2019      Reactions   Ambien [zolpidem Tartrate] Other (See Comments)   Causes nightmares      Medication List    STOP taking these medications   diphenhydramine-acetaminophen 25-500 MG Tabs tablet Commonly known as: TYLENOL PM   Lantus SoloStar 100 UNIT/ML Solostar Pen Generic drug: Insulin Glargine Replaced by: insulin glargine 100 UNIT/ML injection     TAKE these medications   acetaminophen 325 MG tablet Commonly known as: TYLENOL Take 2 tablets (650 mg total) by mouth every 6 (six) hours as needed for mild pain or fever.   acetaZOLAMIDE 250 MG tablet Commonly known as: DIAMOX Take 1 tablet (250 mg total) by mouth 2 (two) times daily.   albuterol 108 (90 Base) MCG/ACT inhaler Commonly known as: VENTOLIN HFA Inhale 2 puffs into the lungs every 6 (six) hours as needed for wheezing or shortness of breath.   ALPRAZolam 0.25 MG tablet Commonly known as: XANAX Take 1 tablet (0.25 mg total) by mouth 3  (three) times daily as needed for anxiety. What changed:   medication strength  how much to take   arformoterol 15 MCG/2ML Nebu Commonly known as: BROVANA Take 2 mLs (15 mcg total) by nebulization 2 (two) times daily.   budesonide 0.25 MG/2ML nebulizer solution Commonly known as: PULMICORT Take 2 mLs (0.25 mg total) by nebulization 2 (two) times daily.   fluticasone 50 MCG/ACT nasal spray Commonly known as: FLONASE SPRAY 2 SPRAYS INTO EACH NOSTRIL EVERY DAY What changed: See the new instructions.   furosemide 40 MG tablet Commonly known as: LASIX Take 40 mg by mouth daily.   GlucoCom Lancets 33G Misc Use with Test Strips to take blood sugars   guaiFENesin 600 MG 12 hr tablet Commonly known as: Mucinex Take 1 tablet (600 mg total) by mouth 2 (two) times daily.   insulin aspart 100 UNIT/ML injection Commonly known as: novoLOG Inject 3 Units into the skin 3 (three) times daily with meals.   insulin glargine 100 UNIT/ML injection Commonly known as: LANTUS Inject 0.35 mLs (35 Units total) into the skin at bedtime for 5 days, THEN 0.3 mLs (30 Units total) at bedtime for 5 days, THEN 0.25 mLs (25 Units total) at bedtime. Start taking on: February 02, 2019 Replaces: Lantus SoloStar 100 UNIT/ML Solostar Pen   Insulin Pen  Needle 32G X 4 MM Misc Commonly known as: BD Pen Needle Nano U/F USE TO ADMINISTER LANTUS INSULIN   iron polysaccharides 150 MG capsule Commonly known as: NIFEREX Take 1 capsule (150 mg total) by mouth daily. Start taking on: February 03, 2019   pantoprazole 40 MG tablet Commonly known as: PROTONIX Take 1 tablet (40 mg total) by mouth daily.   PARoxetine 40 MG tablet Commonly known as: PAXIL Take 1 tablet (40 mg total) by mouth every morning. Start taking on: February 03, 2019 What changed:   how much to take  how to take this  when to take this  additional instructions   predniSONE 20 MG tablet Commonly known as: DELTASONE Take 2 tablets (40  mg total) by mouth daily with breakfast for 5 days, THEN 1 tablet (20 mg total) daily with breakfast for 5 days, THEN 0.5 tablets (10 mg total) daily with breakfast for 5 days. Start taking on: February 02, 2019 What changed: See the new instructions.   Trelegy Ellipta 100-62.5-25 MCG/INH Aepb Generic drug: Fluticasone-Umeclidin-Vilant Inhale 1 puff into the lungs daily.   umeclidinium bromide 62.5 MCG/INH Aepb Commonly known as: INCRUSE ELLIPTA Inhale 1 puff into the lungs daily. Start taking on: February 03, 2019            Durable Medical Equipment  (From admission, onward)         Start     Ordered   02/02/19 0851  DME Oxygen  Once    Question Answer Comment  Length of Need Lifetime   Mode or (Route) Nasal cannula   Liters per Minute 6   Frequency Continuous (stationary and portable oxygen unit needed)   Oxygen conserving device Yes   Oxygen delivery system Gas      02/02/19 0852         Allergies  Allergen Reactions   Ambien [Zolpidem Tartrate] Other (See Comments)    Causes nightmares    The results of significant diagnostics from this hospitalization (including imaging, microbiology, ancillary and laboratory) are listed below for reference.    Significant Diagnostic Studies: CT CHEST WO CONTRAST  Result Date: 01/21/2019 CLINICAL DATA:  COPD exacerbation EXAM: CT CHEST WITHOUT CONTRAST TECHNIQUE: Multidetector CT imaging of the chest was performed following the standard protocol without IV contrast. COMPARISON:  Chest x-ray 01/21/2019, CT chest 10/29/2016, 03/21/2015 FINDINGS: Cardiovascular: Limited evaluation without intravenous contrast. Moderate aortic atherosclerosis. No aneurysmal dilatation. Coronary vascular calcification. 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 Borderline to mild cardiomegaly. Trace pericardial effusion. Mediastinum/Nodes: Trachea is midline. Possible subcentimeter left thyroid  nodule without change. No significant change in mediastinal adenopathy. Index prevascular lymph node measures 11 mm compared with 13 mm previously. Index right paratracheal node measures 12 mm compared with 13.5 mm previously. Probable hilar adenopathy though difficult to measure without intravenous contrast. Esophagus is unremarkable. Lungs/Pleura: Trace left-sided pleural effusion. Small right-sided pleural effusion. Partial consolidations at the right greater than left lung bases. Advanced emphysema. Stable scattered pulmonary nodules in the right middle and upper lobes measuring up to 5 mm. Negative for pneumothorax. Upper Abdomen: No acute abnormality Musculoskeletal: No acute or suspicious lesion IMPRESSION: 1. Trace left-sided pleural effusion. Small right-sided pleural effusion. Partial consolidations at the right greater than left lung bases, could reflect atelectasis or pneumonia. 2. Advanced emphysema. 3. Stable scattered pulmonary nodules in the right middle and upper lobes measuring up to 5 mm, likely benign given lack of interval change. 4. 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 5. Trace pericardial effusion 6. Stable to slight decreased mediastinal adenopathy Aortic Atherosclerosis (ICD10-I70.0) and Emphysema (ICD10-J43.9). Aortic Atherosclerosis (ICD10-I70.0) and Emphysema (ICD10-J43.9). Electronically Signed   By: Donavan Foil M.D.   On: 01/21/2019 22:48   DG CHEST PORT 1 VIEW  Result Date: 01/25/2019 CLINICAL DATA:  Shortness of breath EXAM: PORTABLE CHEST 1 VIEW COMPARISON:  January 24, 2019. FINDINGS: There is loculated left pleural effusion with patchy airspace opacity in the left base, stable. There is underlying bullous disease in the upper lobes. There is interstitial thickening elsewhere which in part is due to redistribution of blood flow to viable lung segments. There is suspected degree of interstitial edema as well. There is cardiomegaly. The pulmonary vascular  is stable and reflects a underlying emphysema. There is aortic atherosclerosis. No adenopathy. No bone lesions. IMPRESSION: Underlying emphysematous change. Apparent loculated left pleural effusion with patchy left base airspace disease. Suspect a degree of underlying interstitial edema. Stable cardiomegaly. No new opacity evident. Aortic Atherosclerosis (ICD10-I70.0). Electronically Signed   By: Lowella Grip III M.D.   On: 01/25/2019 07:40   DG CHEST PORT 1 VIEW  Result Date: 01/24/2019 CLINICAL DATA:  Shortness of breath EXAM: PORTABLE CHEST 1 VIEW COMPARISON:  01/23/2019 FINDINGS: Stable cardiomegaly with central vascular congestion and persistent mid and lower lung airspace opacities versus pneumonia. Small effusions remain, larger on the left. Background emphysema noted with parenchymal scarring in the upper lobes. No pneumothorax. Trachea is midline. Aorta atherosclerotic and tortuous. IMPRESSION: Stable COPD/emphysema pattern with bibasilar airspace disease/atelectasis and small effusions. No significant interval change. Electronically Signed   By: Jerilynn Mages.  Shick M.D.   On: 01/24/2019 09:17   DG CHEST PORT 1 VIEW  Result Date: 01/23/2019 CLINICAL DATA:  Shortness of breath. EXAM: PORTABLE CHEST 1 VIEW COMPARISON:  January 22, 2019. FINDINGS: Stable cardiomegaly. No pneumothorax is noted. Stable bilateral lung opacities are noted, right greater than left, concerning for pneumonia. Small pleural effusions may be present. Bony thorax is unremarkable. IMPRESSION: No active disease. Electronically Signed   By: Marijo Conception M.D.   On: 01/23/2019 08:53   DG CHEST PORT 1 VIEW  Result Date: 01/22/2019 CLINICAL DATA:  Shortness of breath. EXAM: PORTABLE CHEST 1 VIEW COMPARISON:  One day prior FINDINGS: Midline trachea. Mild cardiomegaly. Left-sided aortic arch. Small left and trace right pleural effusions, similar. No pneumothorax. Pulmonary artery enlargement. Bibasilar airspace disease, similar.  IMPRESSION: No significant change since 1 day prior. Left larger than right pleural effusions with bibasilar airspace disease. Cardiomegaly without congestive failure. Left-sided aortic arch. Pulmonary artery enlargement suggests pulmonary arterial hypertension. Electronically Signed   By: Abigail Miyamoto M.D.   On: 01/22/2019 09:50   DG CHEST PORT 1 VIEW  Result Date: 01/21/2019 CLINICAL DATA:  Shortness of breath. EXAM: PORTABLE CHEST 1 VIEW COMPARISON:  Chest radiograph 01/20/2019 FINDINGS: Unchanged cardiomegaly. Redemonstrated bilateral interstitial prominence. Similar appearance of ill-defined opacity within the mid to basilar lungs bilaterally. Unchanged small to moderate left with small right pleural effusions. As before, the left pleural effusion may be partially loculated. No evidence of pneumothorax. No acute bony abnormality. Overlying cardiac monitoring leads. IMPRESSION: No significant interval change as compared to chest radiograph 01/20/2019. Persistent ill-defined opacities within the mid to basilar lungs bilaterally. Findings may reflect edema, atelectasis and/or pneumonia. Persistent left greater than right pleural effusions. As before, the left pleural effusion may be partially loculated. Cardiomegaly. Electronically Signed   By: Kellie Simmering DO  On: 01/21/2019 10:32   DG Chest Port 1 View  Result Date: 01/20/2019 CLINICAL DATA:  Shortness of breath. Additional history provided: Shortness of breath for 2 days, worsening this morning. EXAM: PORTABLE CHEST 1 VIEW COMPARISON:  Chest radiograph 01/02/2019 FINDINGS: Cardiomegaly. Redemonstrated chronic interstitial prominence. Ill-defined bibasilar opacities are slightly more conspicuous than on prior examination 11/02/2018. Persistent small bilateral pleural effusions. As before, the left pleural effusion may be partially loculated. No evidence of pneumothorax. No acute bony abnormality. Overlying cardiac monitoring leads. IMPRESSION:  Cardiomegaly. Bibasilar ill-defined opacities are more conspicuous than on prior chest radiograph 11/02/2018. Findings may reflect edema, atelectasis and/or pneumonia. Persistent small bilateral pleural effusions. As before, the left pleural effusion may be partially loculated. Electronically Signed   By: Kellie Simmering DO   On: 01/20/2019 10:38    Microbiology: Recent Results (from the past 240 hour(s))  Culture, Urine     Status: Abnormal   Collection Time: 01/30/19 11:09 AM   Specimen: Urine, Clean Catch  Result Value Ref Range Status   Specimen Description URINE, CLEAN CATCH  Final   Special Requests   Final    NONE Performed at [facility], [address], [place], [place] [address]    Culture >=100,000 COLONIES/mL PROTEUS MIRABILIS (A)  Final   Report Status 02/01/2019 FINAL  Final   Organism ID, Bacteria PROTEUS MIRABILIS (A)  Final      Susceptibility   Proteus mirabilis - MIC*    AMPICILLIN <=2 SENSITIVE Sensitive     CEFAZOLIN <=4 SENSITIVE Sensitive     CEFTRIAXONE <=0.25 SENSITIVE Sensitive     CIPROFLOXACIN <=0.25 SENSITIVE Sensitive     GENTAMICIN <=1 SENSITIVE Sensitive     IMIPENEM 4 SENSITIVE Sensitive     NITROFURANTOIN 128 RESISTANT Resistant     TRIMETH/SULFA <=20 SENSITIVE Sensitive     AMPICILLIN/SULBACTAM <=2 SENSITIVE Sensitive     PIP/TAZO <=4 SENSITIVE Sensitive     * >=100,000 COLONIES/mL PROTEUS MIRABILIS  SARS CORONAVIRUS 2 (TAT 6-24 HRS) Nasopharyngeal Nasopharyngeal Swab     Status: None   Collection Time: 02/01/19  2:09 PM   Specimen: Nasopharyngeal Swab  Result Value Ref Range Status   SARS Coronavirus 2 NEGATIVE NEGATIVE Final    Comment: (NOTE) SARS-CoV-2 target nucleic acids are NOT DETECTED. The SARS-CoV-2 RNA is generally detectable in upper and lower respiratory specimens during the acute phase of infection. Negative results do not preclude SARS-CoV-2 infection, do not rule out co-infections with other pathogens, and should not be  used as the sole basis for treatment or other patient management decisions. Negative results must be combined with clinical observations, patient history, and epidemiological information. The expected result is Negative. Fact Sheet for Patients: SugarRoll.be Fact Sheet for Healthcare Providers: https://www.woods-mathews.com/ This test is not yet approved or cleared by the [place] FDA and  has been authorized for detection and/or diagnosis of SARS-CoV-2 by FDA under an Emergency Use Authorization (EUA). This EUA will remain  in effect (meaning this test can be used) for the duration of the COVID-19 declaration under Section 56 4(b)(1) of the Act, 21 U.S.C. section 360bbb-3(b)(1), unless the authorization is terminated or revoked sooner. Performed at [facility], [address] [place], [place] [address]      Labs: Basic Metabolic Panel: Recent Labs  Lab 01/28/19 0235 01/30/19 0156 02/01/19 1027 02/02/19 0311  NA 138 139 138 139  K 3.7 3.7 3.9 4.1  CL 103 105 103 104  CO2 25 24  27 25  GLUCOSE 323* 390* 272* 219*  BUN 30* 31* 30* 31*  CREATININE 0.98 1.08* 1.02* 0.94  CALCIUM 8.9 9.1 9.0 9.2   Liver Function Tests: No results for input(s): AST, ALT, ALKPHOS, BILITOT, PROT, ALBUMIN in the last 168 hours. No results for input(s): LIPASE, AMYLASE in the last 168 hours. No results for input(s): AMMONIA in the last 168 hours. CBC: Recent Labs  Lab 01/28/19 0235 01/30/19 0156 01/31/19 0136 02/01/19 1027 02/02/19 0311  WBC 13.6* 15.4* 15.2* 20.4* 24.4*  NEUTROABS 12.4* 13.8* 13.6* 17.7* 21.0*  HGB 9.9* 9.6* 11.2* 10.6* 10.9*  HCT 38.7 36.8 41.5 40.6 41.3  MCV 73.0* 72.6* 73.1* 74.4* 73.8*  PLT 332 370 PLATELET CLUMPS NOTED ON SMEAR, UNABLE TO ESTIMATE 420* 264   Cardiac Enzymes: No results for input(s): CKTOTAL, CKMB, CKMBINDEX, TROPONINI in the last 168 hours. BNP: BNP (last 3 results) Recent Labs     08/26/18 1633 11/02/18 1729 01/20/19 1100  BNP 374.2* 72.3 106.9*    ProBNP (last 3 results) No results for input(s): PROBNP in the last 8760 hours.  CBG: Recent Labs  Lab 02/01/19 0618 02/01/19 1122 02/01/19 1629 02/01/19 2211 02/02/19 0641  GLUCAP 307* 237* 421* 379* 302*    Active Problems:   COPD (chronic obstructive pulmonary disease) (HCC)   Chronic respiratory failure (HCC)   COPD exacerbation (HCC)   Time coordinating discharge: 38 minutes  Signed:        Kamel Haven, DO [facility]  02/02/2019, 8:52 AM

## 2019-02-02 NOTE — Care Management Important Message (Signed)
Important Message  Patient Details  Name: [name] MRN: [id number] Date of Birth: 11-03-1945   Medicare Important Message Given:  Yes     Shelda Altes 02/02/2019, 10:45 AM

## 2019-02-02 NOTE — Progress Notes (Signed)
This RN called [facility] to give report, nurse not available to receive report at this time.

## 2019-02-03 ENCOUNTER — Other Ambulatory Visit: Payer: Self-pay | Admitting: *Deleted

## 2019-02-03 DIAGNOSIS — J441 Chronic obstructive pulmonary disease with (acute) exacerbation: Secondary | ICD-10-CM | POA: Diagnosis not present

## 2019-02-03 DIAGNOSIS — R627 Adult failure to thrive: Secondary | ICD-10-CM

## 2019-02-03 DIAGNOSIS — R0602 Shortness of breath: Secondary | ICD-10-CM | POA: Diagnosis not present

## 2019-02-03 DIAGNOSIS — F411 Generalized anxiety disorder: Secondary | ICD-10-CM | POA: Diagnosis not present

## 2019-02-03 LAB — ACETYLCHOLINE RECEPTOR AB, ALL
Acety choline binding ab: <0.03 nmol/L (ref 0.00–0.24)
Acetylchol Block Ab: 21 % (ref 0–25)
Acetylcholine Modulat Ab: <12 % (ref 0–20)

## 2019-02-03 NOTE — Patient Outreach (Addendum)
[facility]) Care Management  02/03/2019  [name] [birth year] [id number]   Received CHL/ Epic in-basket message from [facility] Liaison (Natividad Brood) and [facility] Coordinator [name]), patient discharged from hospital to skilled nursing facility with hospice. RNCM closed case due to external program and notified Raynaldo Opitz at [facility] via CHL/ Epic in-basket message, of patient's updated status.  Patient case closure letter and MD Case Closure letter sent.   Kherington Meraz H. [name], BSN, [facility] Telephonic CM Phone: [phone] Fax: [phone]

## 2019-02-05 DIAGNOSIS — J449 Chronic obstructive pulmonary disease, unspecified: Secondary | ICD-10-CM | POA: Diagnosis not present

## 2019-02-05 DIAGNOSIS — E1165 Type 2 diabetes mellitus with hyperglycemia: Secondary | ICD-10-CM | POA: Diagnosis not present

## 2019-02-05 DIAGNOSIS — J9621 Acute and chronic respiratory failure with hypoxia: Secondary | ICD-10-CM | POA: Diagnosis not present

## 2019-02-05 DIAGNOSIS — I5032 Chronic diastolic (congestive) heart failure: Secondary | ICD-10-CM | POA: Diagnosis not present

## 2019-02-09 ENCOUNTER — Telehealth: Payer: Self-pay

## 2019-02-09 DIAGNOSIS — J441 Chronic obstructive pulmonary disease with (acute) exacerbation: Secondary | ICD-10-CM | POA: Diagnosis not present

## 2019-02-09 DIAGNOSIS — R5381 Other malaise: Secondary | ICD-10-CM | POA: Diagnosis not present

## 2019-02-09 DIAGNOSIS — R0602 Shortness of breath: Secondary | ICD-10-CM | POA: Diagnosis not present

## 2019-02-09 DIAGNOSIS — F411 Generalized anxiety disorder: Secondary | ICD-10-CM | POA: Diagnosis not present

## 2019-02-09 NOTE — Telephone Encounter (Signed)
Telephone call from [facility] discharge planner at facility.  [name] in agreement with palliative team making visit to see patient on 02-11-19 at 1:00 PM.

## 2019-02-11 ENCOUNTER — Non-Acute Institutional Stay: Payer: Medicare Other

## 2019-02-11 ENCOUNTER — Other Ambulatory Visit: Payer: Self-pay | Admitting: *Deleted

## 2019-02-11 ENCOUNTER — Other Ambulatory Visit: Payer: Self-pay

## 2019-02-11 ENCOUNTER — Other Ambulatory Visit: Payer: Medicare Other

## 2019-02-11 DIAGNOSIS — Z515 Encounter for palliative care: Secondary | ICD-10-CM

## 2019-02-11 NOTE — Progress Notes (Signed)
PATIENT NAME: Shadell Stewart DOB: [birth year] MRN: [id number]  PRIMARY CARE PROVIDER: Binnie Rail, MD  RESPONSIBLE PARTY:  Acct ID - Guarantor Home Phone Work Phone Relationship Acct Type  [id number] - Spadafora,Lockie [phone]  Self P/F     [address], [address], [place], [place] [address]    PLAN OF CARE and INTERVENTIONS:               1.  GOALS OF CARE/ ADVANCE CARE PLANNING:  Patient wants to return home and feel better. Patient has extreme shortness of breath with turning from side to side.                   2.  PATIENT/CAREGIVER EDUCATION:  Education on fall precautions, education on s/s of infection, support                3.  DISEASE STATUS:  SW and RN made palliative care visit to facility to see patient. Patient lying in bed on her left side. Patient informs palliative team she wants to go home. Patient reports she suffered a fall in bathroom at facility yesterday. Patient states PT is only working with her approximately 5 minutes daily and she feels she would do better at home. Patient informs palliative care team she lives alone but has a aide in the home with her  2 days per week for 4 hours.  Patient denies having any pain at the present time. Patient states she is unable to lie flat on her back as her back is messed up from when she carried her third child. Patient gets very short of breath when facility home health aide change patients brief. Patient is incontinent of urine. Patient reports her appetite is fair and patient states she tries to eat something as she knows she needs to get stronger so she can go home. Patients breath sounds are diminished throughout.  Patient was hospitalized December 30th to January 12th then patient transferred to [facility] for Rehab. Patient receiving O2 via nasal cannula at 5 LPM. Patient denies having any cough. Patient has trace edema in her lower extremities. Patient reports she has not been sleeping well but reports this is not new, as she  doesn't sleep well at home.  Palliative care team discussed Hospice Services with patient as patient has declined Hospice Services in the past. Patient states she wants to talk with her doctor before committing to hospice evaluation. Patient states her goal is to return home, feel better and do better overall. Support provided to patient. Patient and facility staff encouraged to contact palliative care with questions or concerns.    HISTORY OF PRESENT ILLNESS: Patient is a 74 year old female who is at [facility] for rehab.  Patient was hospitalized for COPD exacerbation.  Patient has been reluctant to receive hospice services.  Patient followed by palliative care team.     CODE STATUS: Full Code (no ventilator)  ADVANCED DIRECTIVES: No MOST FORM: Yes PPS: 40%   PHYSICAL EXAM:   VITALS: Today's Vitals   02/11/19 1420  BP: (!) 98/44  Pulse: 91  Resp: 20  Temp: 97.9 F (36.6 C)  TempSrc: Temporal  SpO2: 99%  PainSc: 0-No pain    LUNGS: decreased breath sounds CARDIAC: Cor RRR  EXTREMITIES: Trace edema SKIN: no open areasof skin breakdown  NEURO: positive for gait problems and weakness       Nilda Simmer, RN

## 2019-02-11 NOTE — Progress Notes (Signed)
COMMUNITY PALLIATIVE CARE SW NOTE  PATIENT NAME: [name] DOB: December 28, 1945 MRN: [id number]  PRIMARY CARE PROVIDER: Binnie Rail, MD  RESPONSIBLE PARTY:  Acct ID - Guarantor Home Phone Work Phone Relationship Acct Type  [id number] - [name] [phone]  Self P/F     [place] [place], [address], [name], [place] [address]     PLAN OF CARE and INTERVENTIONS:             1. GOALS OF CARE/ ADVANCE CARE PLANNING:  Patient is CPR, no vent. SW recommends MOST form. Goal is to return home. 2. SOCIAL/EMOTIONAL/SPIRITUAL ASSESSMENT/ INTERVENTIONS:  SW and RN met with patient at facility. Patient reports doing "okay", but wants to go home. Patient denies pain at this time. Patient does have pain in her back when she lays flat. Patient is SOB, on O2 at 5L. Patient reports her appetite is fair. Patient said that she is not sleeping well, but was resting when team arrived. Patient was changed by facility staff during visit. SW discussed palliative versus hospice, provided emotional support and assessed goals.  3. PATIENT/CAREGIVER EDUCATION/ COPING:  Patient was alert, engaged. Patient appeared tired. Patient said she feels frustrated that she is at facility and openly shared feelings with team.  4. PERSONAL EMERGENCY PLAN:  Facility to follow protocol. 5. COMMUNITY RESOURCES COORDINATION/ HEALTH CARE NAVIGATION:  Patient is working with PT/OT at facility. 6. FINANCIAL/LEGAL CONCERNS/INTERVENTIONS:  None.     SOCIAL HX:  Social History   Tobacco Use  . Smoking status: Current Every Day Smoker    Packs/day: 0.25    Years: 50.00    Pack years: 12.50    Types: Cigarettes  . Smokeless tobacco: Never Used  . Tobacco comment: 3-4 cigaretters per day 11/20/2017   Substance Use Topics  . Alcohol use: Yes    Alcohol/week: 0.0 standard drinks    Comment: occasional    CODE STATUS:   Code Status: Prior  ADVANCED DIRECTIVES: N MOST FORM COMPLETE:  No HOSPICE EDUCATION PROVIDED: Yes.  PPS: Patient is  dependent of most ADLs. Patient can walk with walker and assistance.   I spent 30 minutes with patient/family, from 1:45-2:15p providing education, support and consultation.   Toni Lombard, LCSW

## 2019-02-11 NOTE — Patient Outreach (Signed)
Screened for potential [facility] Care Management needs as a benefit of  [employer] Medicare.  Member is currently receiving skilled therapy at [name].  Writer attended telephonic interdisciplinary team meeting to assess for disposition needs and transition plan for resident.   Facility reports member has poor activity tolerance. She has some confusion at times. Facility reports member does not have Medicaid. Discussed she had limited support at home prior. Member is active with [name]. Disposition plans pending.   Will continue to follow transition plans and [facility] needs.   [name], MSN-Ed, RN,BSN [place] Acute Care Coordinator [phone] [facility]) [phone]  (Toll free office)

## 2019-02-16 ENCOUNTER — Encounter (HOSPITAL_COMMUNITY): Payer: Self-pay | Admitting: Emergency Medicine

## 2019-02-16 ENCOUNTER — Emergency Department (HOSPITAL_COMMUNITY): Payer: Medicare Other

## 2019-02-16 ENCOUNTER — Other Ambulatory Visit: Payer: Self-pay

## 2019-02-16 ENCOUNTER — Inpatient Hospital Stay (HOSPITAL_COMMUNITY)
Admission: EM | Admit: 2019-02-16 | Discharge: 2019-02-24 | DRG: 193 | Disposition: A | Discharge status: Skilled Nursing Facility | Payer: Medicare Other | Attending: Internal Medicine | Admitting: Internal Medicine

## 2019-02-16 DIAGNOSIS — F17210 Nicotine dependence, cigarettes, uncomplicated: Secondary | ICD-10-CM | POA: Diagnosis present

## 2019-02-16 DIAGNOSIS — E1165 Type 2 diabetes mellitus with hyperglycemia: Secondary | ICD-10-CM | POA: Diagnosis present

## 2019-02-16 DIAGNOSIS — I110 Hypertensive heart disease with heart failure: Secondary | ICD-10-CM | POA: Diagnosis present

## 2019-02-16 DIAGNOSIS — Z794 Long term (current) use of insulin: Secondary | ICD-10-CM | POA: Diagnosis not present

## 2019-02-16 DIAGNOSIS — Z7951 Long term (current) use of inhaled steroids: Secondary | ICD-10-CM | POA: Diagnosis not present

## 2019-02-16 DIAGNOSIS — F329 Major depressive disorder, single episode, unspecified: Secondary | ICD-10-CM | POA: Diagnosis present

## 2019-02-16 DIAGNOSIS — J9621 Acute and chronic respiratory failure with hypoxia: Secondary | ICD-10-CM | POA: Diagnosis present

## 2019-02-16 DIAGNOSIS — Z91048 Other nonmedicinal substance allergy status: Secondary | ICD-10-CM | POA: Diagnosis not present

## 2019-02-16 DIAGNOSIS — K219 Gastro-esophageal reflux disease without esophagitis: Secondary | ICD-10-CM | POA: Diagnosis not present

## 2019-02-16 DIAGNOSIS — J441 Chronic obstructive pulmonary disease with (acute) exacerbation: Secondary | ICD-10-CM | POA: Diagnosis present

## 2019-02-16 DIAGNOSIS — Y95 Nosocomial condition: Secondary | ICD-10-CM | POA: Diagnosis present

## 2019-02-16 DIAGNOSIS — J440 Chronic obstructive pulmonary disease with acute lower respiratory infection: Secondary | ICD-10-CM | POA: Diagnosis present

## 2019-02-16 DIAGNOSIS — I10 Essential (primary) hypertension: Secondary | ICD-10-CM | POA: Diagnosis not present

## 2019-02-16 DIAGNOSIS — F419 Anxiety disorder, unspecified: Secondary | ICD-10-CM | POA: Diagnosis not present

## 2019-02-16 DIAGNOSIS — Z66 Do not resuscitate: Secondary | ICD-10-CM | POA: Diagnosis present

## 2019-02-16 DIAGNOSIS — Z7189 Other specified counseling: Secondary | ICD-10-CM

## 2019-02-16 DIAGNOSIS — M6281 Muscle weakness (generalized): Secondary | ICD-10-CM

## 2019-02-16 DIAGNOSIS — I5030 Unspecified diastolic (congestive) heart failure: Secondary | ICD-10-CM | POA: Diagnosis not present

## 2019-02-16 DIAGNOSIS — I959 Hypotension, unspecified: Secondary | ICD-10-CM | POA: Diagnosis present

## 2019-02-16 DIAGNOSIS — R0689 Other abnormalities of breathing: Secondary | ICD-10-CM | POA: Diagnosis not present

## 2019-02-16 DIAGNOSIS — J189 Pneumonia, unspecified organism: Principal | ICD-10-CM

## 2019-02-16 DIAGNOSIS — I5032 Chronic diastolic (congestive) heart failure: Secondary | ICD-10-CM | POA: Diagnosis present

## 2019-02-16 DIAGNOSIS — Z20822 Contact with and (suspected) exposure to covid-19: Secondary | ICD-10-CM | POA: Diagnosis present

## 2019-02-16 DIAGNOSIS — Z515 Encounter for palliative care: Secondary | ICD-10-CM | POA: Diagnosis present

## 2019-02-16 DIAGNOSIS — R000 Tachycardia, unspecified: Secondary | ICD-10-CM | POA: Diagnosis not present

## 2019-02-16 DIAGNOSIS — E6609 Other obesity due to excess calories: Secondary | ICD-10-CM | POA: Diagnosis not present

## 2019-02-16 DIAGNOSIS — I491 Atrial premature depolarization: Secondary | ICD-10-CM | POA: Diagnosis not present

## 2019-02-16 DIAGNOSIS — F411 Generalized anxiety disorder: Secondary | ICD-10-CM | POA: Diagnosis present

## 2019-02-16 DIAGNOSIS — E785 Hyperlipidemia, unspecified: Secondary | ICD-10-CM | POA: Diagnosis present

## 2019-02-16 DIAGNOSIS — Z8541 Personal history of malignant neoplasm of cervix uteri: Secondary | ICD-10-CM | POA: Diagnosis not present

## 2019-02-16 DIAGNOSIS — J9811 Atelectasis: Secondary | ICD-10-CM | POA: Diagnosis present

## 2019-02-16 DIAGNOSIS — J9622 Acute and chronic respiratory failure with hypercapnia: Secondary | ICD-10-CM | POA: Diagnosis not present

## 2019-02-16 DIAGNOSIS — Z79899 Other long term (current) drug therapy: Secondary | ICD-10-CM

## 2019-02-16 DIAGNOSIS — R279 Unspecified lack of coordination: Secondary | ICD-10-CM | POA: Diagnosis not present

## 2019-02-16 DIAGNOSIS — Z743 Need for continuous supervision: Secondary | ICD-10-CM | POA: Diagnosis not present

## 2019-02-16 DIAGNOSIS — R0602 Shortness of breath: Secondary | ICD-10-CM | POA: Diagnosis not present

## 2019-02-16 DIAGNOSIS — R5382 Chronic fatigue, unspecified: Secondary | ICD-10-CM | POA: Diagnosis not present

## 2019-02-16 DIAGNOSIS — Z7952 Long term (current) use of systemic steroids: Secondary | ICD-10-CM | POA: Diagnosis not present

## 2019-02-16 DIAGNOSIS — F339 Major depressive disorder, recurrent, unspecified: Secondary | ICD-10-CM | POA: Diagnosis not present

## 2019-02-16 DIAGNOSIS — Z659 Problem related to unspecified psychosocial circumstances: Secondary | ICD-10-CM | POA: Diagnosis not present

## 2019-02-16 DIAGNOSIS — Z888 Allergy status to other drugs, medicaments and biological substances status: Secondary | ICD-10-CM

## 2019-02-16 DIAGNOSIS — Z6834 Body mass index (BMI) 34.0-34.9, adult: Secondary | ICD-10-CM | POA: Diagnosis not present

## 2019-02-16 DIAGNOSIS — J80 Acute respiratory distress syndrome: Secondary | ICD-10-CM | POA: Diagnosis not present

## 2019-02-16 DIAGNOSIS — J449 Chronic obstructive pulmonary disease, unspecified: Secondary | ICD-10-CM | POA: Diagnosis not present

## 2019-02-16 DIAGNOSIS — R0902 Hypoxemia: Secondary | ICD-10-CM

## 2019-02-16 DIAGNOSIS — J9620 Acute and chronic respiratory failure, unspecified whether with hypoxia or hypercapnia: Secondary | ICD-10-CM | POA: Diagnosis present

## 2019-02-16 DIAGNOSIS — R918 Other nonspecific abnormal finding of lung field: Secondary | ICD-10-CM | POA: Diagnosis not present

## 2019-02-16 DIAGNOSIS — Z6835 Body mass index (BMI) 35.0-35.9, adult: Secondary | ICD-10-CM

## 2019-02-16 DIAGNOSIS — R069 Unspecified abnormalities of breathing: Secondary | ICD-10-CM | POA: Diagnosis not present

## 2019-02-16 LAB — CBC WITH DIFFERENTIAL/PLATELET
Abs Immature Granulocytes: 0.11 10*3/uL — ABNORMAL HIGH (ref 0.00–0.07)
Basophils Absolute: 0.1 10*3/uL (ref 0.0–0.1)
Basophils Relative: 0 %
Eosinophils Absolute: 0.1 10*3/uL (ref 0.0–0.5)
Eosinophils Relative: 1 %
HCT: 43.1 % (ref 36.0–46.0)
Hemoglobin: 11.4 g/dL — ABNORMAL LOW (ref 12.0–15.0)
Immature Granulocytes: 1 %
Lymphocytes Relative: 8 %
Lymphs Abs: 1 10*3/uL (ref 0.7–4.0)
MCH: 20.4 pg — ABNORMAL LOW (ref 26.0–34.0)
MCHC: 26.5 g/dL — ABNORMAL LOW (ref 30.0–36.0)
MCV: 77.2 fL — ABNORMAL LOW (ref 80.0–100.0)
Monocytes Absolute: 1.1 10*3/uL — ABNORMAL HIGH (ref 0.1–1.0)
Monocytes Relative: 8 %
Neutro Abs: 10.4 10*3/uL — ABNORMAL HIGH (ref 1.7–7.7)
Neutrophils Relative %: 82 %
Platelets: ADEQUATE 10*3/uL (ref 150–400)
RBC: 5.58 MIL/uL — ABNORMAL HIGH (ref 3.87–5.11)
RDW: 25.9 % — ABNORMAL HIGH (ref 11.5–15.5)
WBC: 12.7 10*3/uL — ABNORMAL HIGH (ref 4.0–10.5)
nRBC: 0.2 % (ref 0.0–0.2)

## 2019-02-16 LAB — POCT I-STAT EG7
Acid-base deficit: 3 mmol/L — ABNORMAL HIGH (ref 0.0–2.0)
Acid-base deficit: 3 mmol/L — ABNORMAL HIGH (ref 0.0–2.0)
Bicarbonate: 24.7 mmol/L (ref 20.0–28.0)
Bicarbonate: 24.5 mmol/L (ref 20.0–28.0)
Calcium, Ion: 1.16 mmol/L (ref 1.15–1.40)
Calcium, Ion: 1.21 mmol/L (ref 1.15–1.40)
HCT: 42 % (ref 36.0–46.0)
HCT: 34 % — ABNORMAL LOW (ref 36.0–46.0)
Hemoglobin: 14.3 g/dL (ref 12.0–15.0)
Hemoglobin: 11.6 g/dL — ABNORMAL LOW (ref 12.0–15.0)
O2 Saturation: 48 %
O2 Saturation: 16 %
Patient temperature: 99.8
Potassium: 2.7 mmol/L — CL (ref 3.5–5.1)
Potassium: 3.2 mmol/L — ABNORMAL LOW (ref 3.5–5.1)
Sodium: 141 mmol/L (ref 135–145)
Sodium: 141 mmol/L (ref 135–145)
TCO2: 26 mmol/L (ref 22–32)
TCO2: 26 mmol/L (ref 22–32)
pCO2, Ven: 51.3 mmHg (ref 44.0–60.0)
pCO2, Ven: 55.9 mmHg (ref 44.0–60.0)
pH, Ven: 7.29 (ref 7.250–7.430)
pH, Ven: 7.253 (ref 7.250–7.430)
pO2, Ven: 29 mmHg — CL (ref 32.0–45.0)
pO2, Ven: 16 mmHg — CL (ref 32.0–45.0)

## 2019-02-16 LAB — POC SARS CORONAVIRUS 2 AG -  ED: SARS Coronavirus 2 Ag: NEGATIVE

## 2019-02-16 LAB — I-STAT CHEM 8, ED
BUN: 15 mg/dL (ref 8–23)
Calcium, Ion: 1.15 mmol/L (ref 1.15–1.40)
Chloride: 104 mmol/L (ref 98–111)
Creatinine, Ser: 1 mg/dL (ref 0.44–1.00)
Glucose, Bld: 187 mg/dL — ABNORMAL HIGH (ref 70–99)
HCT: 42 % (ref 36.0–46.0)
Hemoglobin: 14.3 g/dL (ref 12.0–15.0)
Potassium: 2.6 mmol/L — CL (ref 3.5–5.1)
Sodium: 141 mmol/L (ref 135–145)
TCO2: 26 mmol/L (ref 22–32)

## 2019-02-16 LAB — LACTIC ACID, PLASMA: Lactic Acid, Venous: 2.1 mmol/L (ref 0.5–1.9)

## 2019-02-16 LAB — COMPREHENSIVE METABOLIC PANEL
ALT: 24 U/L (ref 0–44)
AST: 18 U/L (ref 15–41)
Albumin: 3.4 g/dL — ABNORMAL LOW (ref 3.5–5.0)
Alkaline Phosphatase: 92 U/L (ref 38–126)
Anion gap: 13 (ref 5–15)
BUN: 15 mg/dL (ref 8–23)
CO2: 24 mmol/L (ref 22–32)
Calcium: 8.8 mg/dL — ABNORMAL LOW (ref 8.9–10.3)
Chloride: 104 mmol/L (ref 98–111)
Creatinine, Ser: 1.11 mg/dL — ABNORMAL HIGH (ref 0.44–1.00)
GFR calc Af Amer: 58 mL/min — ABNORMAL LOW (ref >60)
GFR calc non Af Amer: 50 mL/min — ABNORMAL LOW (ref >60)
Glucose, Bld: 190 mg/dL — ABNORMAL HIGH (ref 70–99)
Potassium: 2.6 mmol/L — CL (ref 3.5–5.1)
Sodium: 141 mmol/L (ref 135–145)
Total Bilirubin: 0.5 mg/dL (ref 0.3–1.2)
Total Protein: 6.4 g/dL — ABNORMAL LOW (ref 6.5–8.1)

## 2019-02-16 LAB — RESPIRATORY PANEL BY RT PCR (FLU A&B, COVID)
Influenza A by PCR: NEGATIVE
Influenza B by PCR: NEGATIVE
SARS Coronavirus 2 by RT PCR: NEGATIVE

## 2019-02-16 LAB — URINALYSIS, ROUTINE W REFLEX MICROSCOPIC
Bilirubin Urine: NEGATIVE
Glucose, UA: NEGATIVE mg/dL
Ketones, ur: 5 mg/dL — AB
Nitrite: NEGATIVE
Protein, ur: NEGATIVE mg/dL
Specific Gravity, Urine: 1.013 (ref 1.005–1.030)
pH: 6 (ref 5.0–8.0)

## 2019-02-16 LAB — BRAIN NATRIURETIC PEPTIDE: B Natriuretic Peptide: 61.2 pg/mL (ref 0.0–100.0)

## 2019-02-16 MED ORDER — POTASSIUM CHLORIDE 10 MEQ/100ML IV SOLN
10.0000 meq | INTRAVENOUS | Status: AC
  Administered 2019-02-16 (×3): 10 meq via INTRAVENOUS
  Filled 2019-02-16 (×3): qty 100

## 2019-02-16 MED ORDER — SODIUM CHLORIDE 0.9 % IV BOLUS
1000.0000 mL | Freq: Once | INTRAVENOUS | Status: AC
  Administered 2019-02-16: 1000 mL via INTRAVENOUS

## 2019-02-16 MED ORDER — VANCOMYCIN HCL 1750 MG/350ML IV SOLN: 1750.0000 mg | INTRAVENOUS | Status: DC

## 2019-02-16 MED ORDER — INSULIN ASPART 100 UNIT/ML ~~LOC~~ SOLN
0.0000 [IU] | Freq: Three times a day (TID) | SUBCUTANEOUS | Status: DC
  Administered 2019-02-17 (×2): 3 [IU] via SUBCUTANEOUS
  Administered 2019-02-17: 5 [IU] via SUBCUTANEOUS
  Administered 2019-02-18: 3 [IU] via SUBCUTANEOUS
  Administered 2019-02-18: 5 [IU] via SUBCUTANEOUS
  Administered 2019-02-18 – 2019-02-19 (×2): 7 [IU] via SUBCUTANEOUS
  Administered 2019-02-19: 3 [IU] via SUBCUTANEOUS
  Administered 2019-02-19: 7 [IU] via SUBCUTANEOUS
  Administered 2019-02-20: 5 [IU] via SUBCUTANEOUS
  Administered 2019-02-20 (×2): 7 [IU] via SUBCUTANEOUS
  Administered 2019-02-21: 1 [IU] via SUBCUTANEOUS
  Administered 2019-02-21 (×2): 5 [IU] via SUBCUTANEOUS
  Administered 2019-02-22: 12:00:00 3 [IU] via SUBCUTANEOUS
  Administered 2019-02-22: 5 [IU] via SUBCUTANEOUS
  Administered 2019-02-23: 12:00:00 3 [IU] via SUBCUTANEOUS
  Administered 2019-02-23: 09:00:00 5 [IU] via SUBCUTANEOUS
  Administered 2019-02-23: 18:00:00 3 [IU] via SUBCUTANEOUS
  Administered 2019-02-24: 2 [IU] via SUBCUTANEOUS

## 2019-02-16 MED ORDER — INSULIN ASPART 100 UNIT/ML ~~LOC~~ SOLN
0.0000 [IU] | Freq: Every day | SUBCUTANEOUS | Status: DC
  Administered 2019-02-17: 3 [IU] via SUBCUTANEOUS
  Administered 2019-02-17: 2 [IU] via SUBCUTANEOUS
  Administered 2019-02-18: 3 [IU] via SUBCUTANEOUS
  Administered 2019-02-19 – 2019-02-20 (×2): 4 [IU] via SUBCUTANEOUS
  Administered 2019-02-23: 22:00:00 2 [IU] via SUBCUTANEOUS

## 2019-02-16 MED ORDER — IPRATROPIUM-ALBUTEROL 0.5-2.5 (3) MG/3ML IN SOLN: 3.0000 mL | Freq: Four times a day (QID) | RESPIRATORY_TRACT | Status: DC | PRN

## 2019-02-16 MED ORDER — ENOXAPARIN SODIUM 40 MG/0.4ML ~~LOC~~ SOLN
40.0000 mg | SUBCUTANEOUS | Status: DC
  Administered 2019-02-17 – 2019-02-24 (×8): 40 mg via SUBCUTANEOUS
  Filled 2019-02-16 (×8): qty 0.4

## 2019-02-16 MED ORDER — BUDESONIDE 0.25 MG/2ML IN SUSP
0.2500 mg | Freq: Two times a day (BID) | RESPIRATORY_TRACT | Status: DC
  Administered 2019-02-17 – 2019-02-24 (×14): 0.25 mg via RESPIRATORY_TRACT
  Filled 2019-02-16 (×15): qty 2

## 2019-02-16 MED ORDER — POTASSIUM CHLORIDE 10 MEQ/100ML IV SOLN
10.0000 meq | INTRAVENOUS | Status: AC
  Administered 2019-02-17 (×2): 10 meq via INTRAVENOUS
  Filled 2019-02-16 (×2): qty 100

## 2019-02-16 MED ORDER — SODIUM CHLORIDE 0.9 % IV BOLUS: 500.0000 mL | Freq: Once | INTRAVENOUS | Status: DC

## 2019-02-16 MED ORDER — IOHEXOL 350 MG/ML SOLN
80.0000 mL | Freq: Once | INTRAVENOUS | Status: AC | PRN
  Administered 2019-02-16: 80 mL via INTRAVENOUS

## 2019-02-16 MED ORDER — METHYLPREDNISOLONE SODIUM SUCC 40 MG IJ SOLR
40.0000 mg | Freq: Four times a day (QID) | INTRAMUSCULAR | Status: DC
  Administered 2019-02-17 – 2019-02-20 (×14): 40 mg via INTRAVENOUS
  Filled 2019-02-16 (×16): qty 1

## 2019-02-16 MED ORDER — VANCOMYCIN HCL 2000 MG/400ML IV SOLN
2000.0000 mg | Freq: Once | INTRAVENOUS | Status: AC
  Administered 2019-02-16: 2000 mg via INTRAVENOUS
  Filled 2019-02-16: qty 400

## 2019-02-16 MED ORDER — SODIUM CHLORIDE 0.9 % IV SOLN
2.0000 g | Freq: Two times a day (BID) | INTRAVENOUS | Status: DC
  Administered 2019-02-17: 2 g via INTRAVENOUS
  Filled 2019-02-16 (×2): qty 2

## 2019-02-16 MED ORDER — POTASSIUM CHLORIDE CRYS ER 20 MEQ PO TBCR
40.0000 meq | EXTENDED_RELEASE_TABLET | Freq: Once | ORAL | Status: DC
  Filled 2019-02-16: qty 2

## 2019-02-16 MED ORDER — VANCOMYCIN HCL IN DEXTROSE 1-5 GM/200ML-% IV SOLN: 1000.0000 mg | Freq: Once | INTRAVENOUS | Status: DC

## 2019-02-16 MED ORDER — SODIUM CHLORIDE 0.9 % IV SOLN
2.0000 g | Freq: Once | INTRAVENOUS | Status: AC
  Administered 2019-02-16: 2 g via INTRAVENOUS
  Filled 2019-02-16: qty 2

## 2019-02-16 MED ORDER — IPRATROPIUM-ALBUTEROL 0.5-2.5 (3) MG/3ML IN SOLN
3.0000 mL | Freq: Four times a day (QID) | RESPIRATORY_TRACT | Status: DC | PRN
  Administered 2019-02-17 – 2019-02-19 (×2): 3 mL via RESPIRATORY_TRACT
  Filled 2019-02-16 (×2): qty 3

## 2019-02-16 NOTE — ED Notes (Signed)
Pt placed on high flow 02   Sats 100% at this time

## 2019-02-16 NOTE — ED Notes (Signed)
IV medications paused for CT scan.  Patient pulled out RAC IV.  Will resume medications when patient returns from CT

## 2019-02-16 NOTE — ED Triage Notes (Signed)
Pt arrives via EMS from [facility] with SOB for 5 days. Reports they doubled her lasix. Pt on 3L [place] chronic and facility changed to 6L when SOB. Pt on NRB 15L.

## 2019-02-16 NOTE — ED Provider Notes (Signed)
[facility] EMERGENCY DEPARTMENT Provider Note   CSN: [id number] Arrival date & time: 02/16/19  1656     History Chief Complaint  Patient presents with  . Shortness of Breath    [name] is a 74 y.o. female's history of diastolic heart failure, hypertension, hyperlipidemia, COPD on 6 L at baseline, who presented with shortness of breath.  Patient was recently admitted to the hospital for hypoxia and hypercapnia likely secondary to COPD and CHF .  Patient initially needed BiPAP and was eventually sent back to the nursing home on 3 L nasal cannula. Facility noted that he is she is more short of breath and put on 6 L and she was noted to be veryTachypneic and hypoxic.  EMS put her on nonrebreather and her oxygen level is 90%.  Patient was also noted to be hypotensive. Patient is currently on lasix.   The history is provided by the patient and the EMS personnel.  Level V caveat- condition of patient      Past Medical History:  Diagnosis Date  . Arrhythmia   . Bronchiectasis march 2012   On CT chest. RUL. Mild  . CHF (congestive heart failure) ([place])   . Chicken pox   . Chronic diastolic heart failure ([place])   . Chronic respiratory failure ([place])    Followed in Pulmonary clinic/ [facility]/ Ramaswamy  - 06/30/2012 desat to 86%  RA walking 50 ft, recovered to 90% at rest - 06/30/2012  Walked 1lpm x 3 laps @ 185 ft each stopped due to  Sob, no desat  rec 02 2lpm with activity and sleeping, ok at rest   . COPD (chronic obstructive pulmonary disease) ([place])     FEV-1 in 2008 was 63% with a diffusion capacity of 33%.   Marland Kitchen Dyspnea   . Generalized anxiety disorder   . History of cervical cancer 1982  . Hyperlipidemia   . Hypertension   . Leg swelling    Venous doppler right 07/21/12 >>Neg    . 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 Medical history non-contributory   . Medical non-compliance   . MITRAL VALVE PROLAPSE   .  Obesity, unspecified   . Pulmonary nodule 03/2010   101mm RUL and RLL 1st seen march 2012 CT, ?progression 12/2012 CT  . Seasonal allergies   . Tobacco abuse     Smokes one pack a day since age 25.    Patient Active Problem List   Diagnosis Date Noted  . Adult failure to thrive   . Acute respiratory failure with hypoxia and hypercapnia (HCC)   . On corticosteroid therapy 12/08/2018  . Current chronic use of systemic steroids 12/08/2018  . Complex care coordination 11/13/2018  . Palliative care by specialist   . DNR (do not resuscitate) discussion   . COPD exacerbation ([place]) 11/02/2018  . Hypokalemia 11/02/2018  . Acute on chronic respiratory failure with hypoxia ([place]) 08/26/2018  . Hyperkalemia 08/26/2018  . Psoriasis 07/01/2017  . Depression 06/24/2016  . Pulmonary hypertension ([place])   . Physical deconditioning 11/15/2015  . GERD (gastroesophageal reflux disease) 11/01/2015  . 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 Diabetes ([place]) 01/23/2015  . Fatigue 01/20/2015  . Obesity, unspecified 03/15/2013  . Acute on chronic diastolic CHF (congestive heart failure) ([place]) 03/14/2013  . Chronic respiratory failure ([place]) 06/30/2012  . COPD (chronic obstructive pulmonary disease) ([place])   . Tobacco abuse   . Pulmonary nodule 03/22/2010  .  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 Bronchiectasis ([place]) 03/22/2010  . Hyperlipidemia 06/13/2008  . Anxiety 02/20/2007  . MITRAL VALVE PROLAPSE 02/20/2007  . CERVICAL CANCER, HX OF 02/20/2007    Past Surgical History:  Procedure Laterality Date  . TOTAL ABDOMINAL HYSTERECTOMY       OB History   No obstetric history on file.     Family History  Problem Relation Age of Onset  . Other Father        MVA  . Esophageal cancer Mother     Social History   Tobacco Use  . Smoking status: Current Every Day Smoker    Packs/day: 0.25    Years: 50.00    Pack years: 12.50    Types: Cigarettes  . Smokeless tobacco: Never Used  . Tobacco comment:  3-4 cigaretters per day 11/20/2017   Substance Use Topics  . Alcohol use: Yes    Alcohol/week: 0.0 standard drinks    Comment: occasional  . Drug use: No    Home Medications Prior to Admission medications   Medication Sig Start Date End Date Taking? Authorizing Provider  acetaminophen (TYLENOL) 325 MG tablet Take 2 tablets (650 mg total) by mouth every 6 (six) hours as needed for mild pain or fever. 02/02/19   Swayze, Ava, DO  acetaZOLAMIDE (DIAMOX) 250 MG tablet Take 1 tablet (250 mg total) by mouth 2 (two) times daily. 09/05/18   Nita Sells, MD  albuterol (PROVENTIL HFA;VENTOLIN HFA) 108 (90 Base) MCG/ACT inhaler Inhale 2 puffs into the lungs every 6 (six) hours as needed for wheezing or shortness of breath. 12/24/17   Brand Males, MD  ALPRAZolam Duanne Moron) 0.25 MG tablet Take 1 tablet (0.25 mg total) by mouth 3 (three) times daily as needed for anxiety. 02/02/19   Swayze, Ava, DO  arformoterol (BROVANA) 15 MCG/2ML NEBU Take 2 mLs (15 mcg total) by nebulization 2 (two) times daily. 02/02/19   Swayze, Ava, DO  budesonide (PULMICORT) 0.25 MG/2ML nebulizer solution Take 2 mLs (0.25 mg total) by nebulization 2 (two) times daily. 02/02/19   Swayze, Ava, DO  fluticasone (FLONASE) 50 MCG/ACT nasal spray SPRAY 2 SPRAYS INTO EACH NOSTRIL EVERY DAY Patient taking differently: Place 2 sprays into both nostrils daily.  01/11/19   Brand Males, MD  Fluticasone-Umeclidin-Vilant (TRELEGY ELLIPTA) 100-62.5-25 MCG/INH AEPB Inhale 1 puff into the lungs daily. 07/16/18   Brand Males, MD  furosemide (LASIX) 40 MG tablet Take 40 mg by mouth daily.     [provider]  GLUCOCOM LANCETS 33G MISC Use with Test Strips to take blood sugars 07/20/15   Burns, Claudina Lick, MD  guaiFENesin (MUCINEX) 600 MG 12 hr tablet Take 1 tablet (600 mg total) by mouth 2 (two) times daily. 01/04/19   Martyn Ehrich, NP  insulin aspart (NOVOLOG) 100 UNIT/ML injection Inject 3 Units into the skin 3 (three)  times daily with meals. 02/02/19   Swayze, Ava, DO  insulin glargine (LANTUS) 100 UNIT/ML injection Inject 0.35 mLs (35 Units total) into the skin at bedtime for 5 days, THEN 0.3 mLs (30 Units total) at bedtime for 5 days, THEN 0.25 mLs (25 Units total) at bedtime. 02/02/19 05/22/19  Swayze, Ava, DO  Insulin Pen Needle (BD PEN NEEDLE NANO U/F) 32G X 4 MM MISC USE TO ADMINISTER LANTUS INSULIN 05/08/16   Binnie Rail, MD  iron polysaccharides (NIFEREX) 150 MG capsule Take 1 capsule (150 mg total) by mouth daily. 02/03/19   Swayze, Ava, DO  pantoprazole (PROTONIX) 40 MG  tablet Take 1 tablet (40 mg total) by mouth daily. 01/26/18   Binnie Rail, MD  PARoxetine (PAXIL) 40 MG tablet Take 1 tablet (40 mg total) by mouth every morning. 02/03/19   Swayze, Ava, DO  predniSONE (DELTASONE) 20 MG tablet Take 2 tablets (40 mg total) by mouth daily with breakfast for 5 days, THEN 1 tablet (20 mg total) daily with breakfast for 5 days, THEN 0.5 tablets (10 mg total) daily with breakfast for 5 days. 02/02/19 02/17/19  Swayze, Ava, DO  umeclidinium bromide (INCRUSE ELLIPTA) 62.5 MCG/INH AEPB Inhale 1 puff into the lungs daily. 02/03/19   Swayze, Ava, DO    Allergies    Ambien [zolpidem tartrate]  Review of Systems   Review of Systems  Respiratory: Positive for shortness of breath.   All other systems reviewed and are negative.   Physical Exam Updated Vital Signs BP 122/76   Pulse (!) 109   Temp 99.8 F (37.7 C) (Rectal)   Resp (!) 23   Ht 5\' 1"  (1.549 m)   Wt 94.9 kg   SpO2 94%   BMI 39.53 kg/m   Physical Exam Vitals and nursing note reviewed.  Constitutional:      Comments: Chronically ill, tachypneic   HENT:     Head: Normocephalic.  Eyes:     Pupils: Pupils are equal, round, and reactive to light.  Cardiovascular:     Rate and Rhythm: Regular rhythm. Tachycardia present.  Pulmonary:     Comments: Tachypneic, no wheezing or crackles  Abdominal:     General: Bowel sounds are normal.      Palpations: Abdomen is soft.  Musculoskeletal:        General: Normal range of motion.     Cervical back: Normal range of motion and neck supple.  Skin:    General: Skin is warm.     Capillary Refill: Capillary refill takes less than 2 seconds.  Neurological:     Comments: Confused, nonfocal neuro exam, moving all extremities, not following commands   Psychiatric:     Comments: Unable      ED Results / Procedures / Treatments   Labs (all labs ordered are listed, but only abnormal results are displayed) Labs Reviewed  LACTIC ACID, PLASMA - Abnormal; Notable for the following components:      Result Value   Lactic Acid, Venous 2.1 (*)    All other components within normal limits  COMPREHENSIVE METABOLIC PANEL - Abnormal; Notable for the following components:   Potassium 2.6 (*)    Glucose, Bld 190 (*)    Creatinine, Ser 1.11 (*)    Calcium 8.8 (*)    Total Protein 6.4 (*)    Albumin 3.4 (*)    GFR calc non Af Amer 50 (*)    GFR calc Af Amer 58 (*)    All other components within normal limits  CBC WITH DIFFERENTIAL/PLATELET - Abnormal; Notable for the following components:   WBC 12.7 (*)    RBC 5.58 (*)    Hemoglobin 11.4 (*)    MCV 77.2 (*)    MCH 20.4 (*)    MCHC 26.5 (*)    RDW 25.9 (*)    Neutro Abs 10.4 (*)    Monocytes Absolute 1.1 (*)    Abs Immature Granulocytes 0.11 (*)    All other components within normal limits  URINALYSIS, ROUTINE W REFLEX MICROSCOPIC - Abnormal; Notable for the following components:   APPearance HAZY (*)    Hgb  urine dipstick MODERATE (*)    Ketones, ur 5 (*)    Leukocytes,Ua MODERATE (*)    Bacteria, UA RARE (*)    All other components within normal limits  I-STAT CHEM 8, ED - Abnormal; Notable for the following components:   Potassium 2.6 (*)    Glucose, Bld 187 (*)    All other components within normal limits  POCT I-STAT EG7 - Abnormal; Notable for the following components:   pO2, Ven 29.0 (*)    Acid-base deficit 3.0 (*)     Potassium 2.7 (*)    All other components within normal limits  RESPIRATORY PANEL BY RT PCR (FLU A&B, COVID)  CULTURE, BLOOD (ROUTINE X 2)  CULTURE, BLOOD (ROUTINE X 2)  URINE CULTURE  BRAIN NATRIURETIC PEPTIDE  LACTIC ACID, PLASMA  BLOOD GAS, ARTERIAL  POC SARS CORONAVIRUS 2 AG -  ED  I-STAT VENOUS BLOOD GAS, ED    EKG EKG Interpretation  Date/Time:  Tuesday February 16 2019 17:11:25 EST Ventricular Rate:  126 PR Interval:  140 QRS Duration: 116 QT Interval:  354 QTC Calculation: 512 R Axis:   60 Text Interpretation: Sinus tachycardia with occasional Premature ventricular complexes Low voltage QRS RSR' or QR pattern in V1 suggests right ventricular conduction delay ST & T wave abnormality, consider anterolateral ischemia Abnormal ECG Since last tracing rate faster Confirmed by Wandra Arthurs [phone]) on 02/16/2019 5:52:58 PM   Radiology DG Chest Port 1 View  Result Date: 02/16/2019 CLINICAL DATA:  Shortness of breath. EXAM: PORTABLE CHEST 1 VIEW COMPARISON:  January 24, 2018 FINDINGS: Mild, chronic appearing increased interstitial lung markings are noted bilaterally. Mild atelectasis and/or infiltrate is seen within the right lung base with mild to moderate severity left basilar atelectasis and/or infiltrate noted. A focal area of opacification is seen along the lateral aspect of the left lung base. There is a small left pleural effusion. No pneumothorax is seen. The cardiac silhouette is markedly enlarged. Degenerative changes seen throughout the thoracic spine. IMPRESSION: 1. Mild right basilar and moderate severity left basilar atelectasis and/or infiltrate. 2. Focal opacification within the lateral aspect of the left lung base which is more prominent on the current study and may represent a more focal area of consolidation. An area of loculated pleural fluid cannot be excluded. 3. Stable cardiomegaly. Electronically Signed   By: Virgina Norfolk M.D.   On: 02/16/2019 18:04     Procedures Procedures (including critical care time)  Angiocath insertion Performed by: Wandra Arthurs  Consent: Verbal consent obtained. Risks and benefits: risks, benefits and alternatives were discussed Time out: Immediately prior to procedure a "time out" was called to verify the correct patient, procedure, equipment, support staff and site/side marked as required.  Preparation: Patient was prepped and draped in the usual sterile fashion.  Vein Location: L antecube  Ultrasound Guided  Gauge: 20 long   Normal blood return and flush without difficulty Patient tolerance: Patient tolerated the procedure well with no immediate complications.   CRITICAL CARE Performed by: Wandra Arthurs   Total critical care time: 30 minutes  Critical care time was exclusive of separately billable procedures and treating other patients.  Critical care was necessary to treat or prevent imminent or life-threatening deterioration.  Critical care was time spent personally by me on the following activities: development of treatment plan with patient and/or surrogate as well as nursing, discussions with consultants, evaluation of patient's response to treatment, examination of patient, obtaining history from patient or surrogate,  ordering and performing treatments and interventions, ordering and review of laboratory studies, ordering and review of radiographic studies, pulse oximetry and re-evaluation of patient's condition.   Medications Ordered in ED Medications  potassium chloride 10 mEq in 100 mL IVPB (10 mEq Intravenous New Bag/Given 02/16/19 2122)  ceFEPIme (MAXIPIME) 2 g in sodium chloride 0.9 % 100 mL IVPB (0 g Intravenous Stopped 02/16/19 1916)  sodium chloride 0.9 % bolus 1,000 mL (0 mLs Intravenous Stopped 02/16/19 1900)  vancomycin (VANCOREADY) IVPB 2000 mg/400 mL (2,000 mg Intravenous New Bag/Given 02/16/19 2005)  sodium chloride 0.9 % bolus 1,000 mL (1,000 mLs Intravenous New Bag/Given  02/16/19 2028)    ED Course  I have reviewed the triage vital signs and the nursing notes.  Pertinent labs & imaging results that were available during my care of the patient were reviewed by me and considered in my medical decision making (see chart for details).    MDM Rules/Calculators/A&P                      Samiyah Matzke is a 74 y.o. female here presenting with altered mental status, hypoxia, hypotension, tachycardia. Consider Covid versus pneumonia versus renal failure from overdiuresis .  Will get labs and chest x-ray and test for Covid.    10:16 PM Patient's CXR showed pneumonia, possible loculated effusion. WBC 12, lactate 2.0. pH 7.29 and O2 is 29.  Her hypoxia is likely from underlying pneumonia.  Her blood pressure went up to 122/76 after the IV bolus.  Her Covid test is negative .  She was given IV antibiotics for HCAP. CT chest ordered to further assess the effusion. She is on 15 L high flow oxygen and her O2 is 95-100%. Will be admitted to stepdown for HCAP with hypoxia (on high flow oxygen), pleural effusion.   Final Clinical Impression(s) / ED Diagnoses Final diagnoses:  None    Rx / DC Orders ED Discharge Orders    None       Drenda Freeze, MD 02/16/19 2218

## 2019-02-16 NOTE — ED Notes (Signed)
IV to LAC now removed by patient.  Unaware that she pulled it out.  Potassium now infusing into the right wrist IV

## 2019-02-16 NOTE — Progress Notes (Signed)
Pharmacy Antibiotic Note  [name] is a 74 y.o. female admitted on 02/16/2019 with pneumonia.  Pharmacy has been consulted for Cefepime and Vancomycin dosing.   Height: 5\' 1"  (154.9 cm) Weight: 209 lb 3.5 oz (94.9 kg) IBW/kg (Calculated) : 47.8  Temp (24hrs), Avg:99.8 F (37.7 C), Min:99.8 F (37.7 C), Max:99.8 F (37.7 C)  Recent Labs  Lab 02/16/19 1746 02/16/19 1807  WBC 12.7*  --   CREATININE 1.11* 1.00  LATICACIDVEN 2.1*  --     Estimated Creatinine Clearance: 54.3 mL/min (by C-G formula based on SCr of 1 mg/dL).    Allergies  Allergen Reactions  . Ambien [Zolpidem Tartrate] Other (See Comments)    Causes nightmares    Antimicrobials this admission: 1/26 Cefepime >>  1/26 Vancomycin >>   Dose adjustments this admission:   Microbiology results: 1/26 BCx: Pending 1/26 UCx: Pending   Plan:  - Cefepime 2g IV q12h  - Vancomycin 2000mg  IV x 1 dose  - Followed by Vancomycin 1750mg  IV q48hr  - Est calc AUC 502 - Monitor patients renal function and urine output   Thank you for allowing pharmacy to be a part of this patient's care.  Duanne Limerick PharmD. BCPS  02/16/2019 11:02 PM

## 2019-02-17 ENCOUNTER — Encounter (HOSPITAL_COMMUNITY): Payer: Self-pay | Admitting: Internal Medicine

## 2019-02-17 DIAGNOSIS — J9622 Acute and chronic respiratory failure with hypercapnia: Secondary | ICD-10-CM

## 2019-02-17 DIAGNOSIS — J9621 Acute and chronic respiratory failure with hypoxia: Secondary | ICD-10-CM

## 2019-02-17 LAB — CBC
HCT: 42.5 % (ref 36.0–46.0)
HCT: 36.9 % (ref 36.0–46.0)
Hemoglobin: 11.1 g/dL — ABNORMAL LOW (ref 12.0–15.0)
Hemoglobin: 9.8 g/dL — ABNORMAL LOW (ref 12.0–15.0)
MCH: 20.2 pg — ABNORMAL LOW (ref 26.0–34.0)
MCH: 20.1 pg — ABNORMAL LOW (ref 26.0–34.0)
MCHC: 26.1 g/dL — ABNORMAL LOW (ref 30.0–36.0)
MCHC: 26.6 g/dL — ABNORMAL LOW (ref 30.0–36.0)
MCV: 77.3 fL — ABNORMAL LOW (ref 80.0–100.0)
MCV: 75.6 fL — ABNORMAL LOW (ref 80.0–100.0)
Platelets: 285 10*3/uL (ref 150–400)
Platelets: 276 10*3/uL (ref 150–400)
RBC: 5.5 MIL/uL — ABNORMAL HIGH (ref 3.87–5.11)
RBC: 4.88 MIL/uL (ref 3.87–5.11)
RDW: 25.9 % — ABNORMAL HIGH (ref 11.5–15.5)
RDW: 25.6 % — ABNORMAL HIGH (ref 11.5–15.5)
WBC: 10.6 10*3/uL — ABNORMAL HIGH (ref 4.0–10.5)
WBC: 9.4 10*3/uL (ref 4.0–10.5)
nRBC: 0.2 % (ref 0.0–0.2)
nRBC: 0 % (ref 0.0–0.2)

## 2019-02-17 LAB — POCT I-STAT 7, (LYTES, BLD GAS, ICA,H+H)
Acid-base deficit: 6 mmol/L — ABNORMAL HIGH (ref 0.0–2.0)
Bicarbonate: 20.4 mmol/L (ref 20.0–28.0)
Calcium, Ion: 1.21 mmol/L (ref 1.15–1.40)
HCT: 35 % — ABNORMAL LOW (ref 36.0–46.0)
Hemoglobin: 11.9 g/dL — ABNORMAL LOW (ref 12.0–15.0)
O2 Saturation: 95 %
Patient temperature: 99.8
Potassium: 3.2 mmol/L — ABNORMAL LOW (ref 3.5–5.1)
Sodium: 142 mmol/L (ref 135–145)
TCO2: 22 mmol/L (ref 22–32)
pCO2 arterial: 43 mmHg (ref 32.0–48.0)
pH, Arterial: 7.288 — ABNORMAL LOW (ref 7.350–7.450)
pO2, Arterial: 85 mmHg (ref 83.0–108.0)

## 2019-02-17 LAB — BASIC METABOLIC PANEL
Anion gap: 8 (ref 5–15)
Anion gap: 9 (ref 5–15)
BUN: 12 mg/dL (ref 8–23)
BUN: 13 mg/dL (ref 8–23)
CO2: 21 mmol/L — ABNORMAL LOW (ref 22–32)
CO2: 19 mmol/L — ABNORMAL LOW (ref 22–32)
Calcium: 8.3 mg/dL — ABNORMAL LOW (ref 8.9–10.3)
Calcium: 8.2 mg/dL — ABNORMAL LOW (ref 8.9–10.3)
Chloride: 111 mmol/L (ref 98–111)
Chloride: 112 mmol/L — ABNORMAL HIGH (ref 98–111)
Creatinine, Ser: 0.8 mg/dL (ref 0.44–1.00)
Creatinine, Ser: 0.79 mg/dL (ref 0.44–1.00)
GFR calc Af Amer: >60 mL/min (ref >60)
GFR calc Af Amer: >60 mL/min (ref >60)
GFR calc non Af Amer: >60 mL/min (ref >60)
GFR calc non Af Amer: >60 mL/min (ref >60)
Glucose, Bld: 157 mg/dL — ABNORMAL HIGH (ref 70–99)
Glucose, Bld: 243 mg/dL — ABNORMAL HIGH (ref 70–99)
Potassium: 3.4 mmol/L — ABNORMAL LOW (ref 3.5–5.1)
Potassium: 3.8 mmol/L (ref 3.5–5.1)
Sodium: 140 mmol/L (ref 135–145)
Sodium: 140 mmol/L (ref 135–145)

## 2019-02-17 LAB — MRSA PCR SCREENING: MRSA by PCR: NEGATIVE

## 2019-02-17 LAB — MAGNESIUM: Magnesium: 1.7 mg/dL (ref 1.7–2.4)

## 2019-02-17 LAB — GLUCOSE, CAPILLARY
Glucose-Capillary: 228 mg/dL — ABNORMAL HIGH (ref 70–99)
Glucose-Capillary: 260 mg/dL — ABNORMAL HIGH (ref 70–99)
Glucose-Capillary: 209 mg/dL — ABNORMAL HIGH (ref 70–99)
Glucose-Capillary: 218 mg/dL — ABNORMAL HIGH (ref 70–99)
Glucose-Capillary: 287 mg/dL — ABNORMAL HIGH (ref 70–99)

## 2019-02-17 LAB — CREATININE, SERUM
Creatinine, Ser: 0.81 mg/dL (ref 0.44–1.00)
GFR calc Af Amer: >60 mL/min (ref >60)
GFR calc non Af Amer: >60 mL/min (ref >60)

## 2019-02-17 LAB — PHOSPHORUS: Phosphorus: 2.1 mg/dL — ABNORMAL LOW (ref 2.5–4.6)

## 2019-02-17 LAB — HEMOGLOBIN A1C
Hgb A1c MFr Bld: 9.1 % — ABNORMAL HIGH (ref 4.8–5.6)
Mean Plasma Glucose: 214.47 mg/dL

## 2019-02-17 LAB — SARS CORONAVIRUS 2 (TAT 6-24 HRS): SARS Coronavirus 2: NEGATIVE

## 2019-02-17 LAB — LACTIC ACID, PLASMA: Lactic Acid, Venous: 0.9 mmol/L (ref 0.5–1.9)

## 2019-02-17 MED ORDER — LORAZEPAM 2 MG/ML IJ SOLN
0.5000 mg | Freq: Once | INTRAMUSCULAR | Status: AC
  Administered 2019-02-17: 0.5 mg via INTRAVENOUS
  Filled 2019-02-17: qty 1

## 2019-02-17 MED ORDER — INSULIN ASPART 100 UNIT/ML ~~LOC~~ SOLN
3.0000 [IU] | Freq: Three times a day (TID) | SUBCUTANEOUS | Status: DC
  Administered 2019-02-17 – 2019-02-18 (×3): 3 [IU] via SUBCUTANEOUS

## 2019-02-17 MED ORDER — SODIUM CHLORIDE 0.9 % IV SOLN
2.0000 g | Freq: Three times a day (TID) | INTRAVENOUS | Status: DC
  Administered 2019-02-17 – 2019-02-22 (×15): 2 g via INTRAVENOUS
  Filled 2019-02-17 (×17): qty 2

## 2019-02-17 MED ORDER — VANCOMYCIN HCL IN DEXTROSE 1-5 GM/200ML-% IV SOLN
1000.0000 mg | INTRAVENOUS | Status: DC
  Filled 2019-02-17: qty 200

## 2019-02-17 MED ORDER — INSULIN GLARGINE 100 UNIT/ML ~~LOC~~ SOLN
25.0000 [IU] | Freq: Every day | SUBCUTANEOUS | Status: DC
  Administered 2019-02-17: 25 [IU] via SUBCUTANEOUS
  Filled 2019-02-17 (×2): qty 0.25

## 2019-02-17 NOTE — Progress Notes (Addendum)
CSW unable to complete assessment-patient was sleep.  CSW following.   Thurmond Butts, MSW, [place] Clinical Social Worker

## 2019-02-17 NOTE — Progress Notes (Signed)
Inpatient Diabetes Program Recommendations  AACE/ADA: New Consensus Statement on Inpatient Glycemic Control (2015)  Target Ranges:  Prepandial:   less than 140 mg/dL      Peak postprandial:   less than 180 mg/dL (1-2 hours)      Critically ill patients:  140 - 180 mg/dL   Lab Results  Component Value Date   GLUCAP 260 (H) 02/17/2019   HGBA1C 9.1 (H) 02/17/2019    Review of Glycemic Control Results for Toni, [name] (MRN [id number]) as of 02/17/2019 08:23  Ref. Range 02/17/2019 06:32  Glucose-Capillary Latest Ref Range: 70 - 99 mg/dL 260 (H)   Diabetes history: DM 2 Outpatient Diabetes medications: Lantus 25 units daily and Novolog 3 units tid with meals Current orders for Inpatient glycemic control:  Novolog sensitive tid with meals and HS Inpatient Diabetes Program Recommendations:   Please restart patient's home dose of Lantus 25 units daily and add Novolog meal coverage when appropriate.   Thanks,  Adah Perl, RN, BC-ADM Inpatient Diabetes Coordinator Pager [phone]

## 2019-02-17 NOTE — Progress Notes (Signed)
Paged on call [place] asking if pt needs 4 more runs of potassium as last potassium level was 3.4 and pt had 3 runs in ED before that lab was drawn. She said two more runs of potassium will be enough. Will continue to monitor.

## 2019-02-17 NOTE — Progress Notes (Signed)
  Pharmacy Antibiotic Note  [name] is a 74 y.o. female admitted from SNF on 02/16/2019 with severe respiratory distress and presumed HAP.  Pharmacy has been consulted for vancomycin/cefepime dosing.   Scr improved since admission. MRSA screen was negative. CXR with likely consolidation.   1/27 Vancomycin 1000mg  Q 24 hr Scr used: 0.8 mg/dL Weight: 92.2 kg Vd coeff: 0.5 L/kg Est AUC: 428.5   Plan: Increase cefepime to 2g Q 8hr  Increase vancomycin to 1g Q24 hr Suggest discontinue vancomycin given negative MRSA screen Monitor renal fx, cultures, LOT  Height: 5\' 4"  (162.6 cm) Weight: 203 lb 4.2 oz (92.2 kg) IBW/kg (Calculated) : 54.7  Temp (24hrs), Avg:98.7 F (37.1 C), Min:97.6 F (36.4 C), Max:99.8 F (37.7 C)  Recent Labs  Lab 02/16/19 1746 02/16/19 1807 02/17/19 0056 02/17/19 0320 02/17/19 0650  WBC 12.7*  --  10.6* 9.4  --   CREATININE 1.11* 1.00 0.80 0.81 0.79  LATICACIDVEN 2.1*  --   --  0.9  --     Estimated Creatinine Clearance: 71 mL/min (by C-G formula based on SCr of 0.79 mg/dL).     Antimicrobials this admission: vanc 1/26 >>  cefe 1/26 >>   Microbiology results: 1/26 BCx: pending 1/27 MRSA PCR: neg 1/26 Covid/flu neg   Benetta Spar, PharmD, BCPS, BCCP Clinical Pharmacist  Please check AMION for all [facility] phone numbers After 10:00 PM, call [place] [phone]

## 2019-02-17 NOTE — Progress Notes (Signed)
Pt on 5L [place] with an oxygen saturation of 83%. Pt placed back on BiPap at this time. Oxygen saturations 93%. Will continue to monitor.  Clyde Canterbury, RN

## 2019-02-17 NOTE — Plan of Care (Signed)
Poc progressing.  

## 2019-02-17 NOTE — Consult Note (Signed)
   [facility] CM Inpatient Consult   02/17/2019  Trena Blamer January 29, 1945 [id number]   Patient screened for extreme high risk score for unplanned readmission score with less than 30 days re- hospitalizations with a recent stay at a skilled nursing facility from [facility] facility.   Review of patient's medical record reveals patient is admitted with Acute Respiratory distress.  Chart reviewed from [place] PAC notes at the facility.  Plan: Continue to follow progress and disposition to assess for post hospital care management needs.    Please place a [facility] Care Management consult as appropriate and for questions contact:   Natividad Brood, RN BSN [facility] Liaison  [phone] business mobile phone Toll free office [phone]  Fax number: [phone] [email or web address]

## 2019-02-17 NOTE — Plan of Care (Signed)
74 yo admitted with COPD exacerbation has home oxygen 3 to 6 lit.  She is admitted with tachycardia tachypnea hypoxia.  She was on 5 L nasal cannula saturation 83% and then she was put back on BiPAP.  Oxygen saturation improved to 93%.  CT chest shows left lower lobe atelectasis versus infiltrates with mild left-sided volume loss, multiple stable subcentimeter right upper lobe right middle lobe noncalcified lung nodules. When I saw her she was on BiPAP. Continue Solu-Medrol and nebulizers.  Patient still with active wheezing. Continue cefepime. DC vancomycin.  MRSA PCR negative. Trial off of BiPAP patient  started to desaturate again this afternoon.  Placed her back on BiPAP. Lantus restarted.

## 2019-02-17 NOTE — H&P (Signed)
History and Physical  [name] [id number] DOB: [birth year] DOA: 02/16/2019  Referring physician: Dr. Darl Householder, ER physician PCP: Binnie Rail, MD  Outpatient Specialists:    Patient coming from: Home  Chief Complaint: Severe respiratory distress (to the point that patient could not give any history to me)  HPI:  Patient is a 74 year old female past medical history significant for COPD on 3 - 6 L of oxygen according to the Dr. Darl Householder, diastolic CHF, obesity, mitral valve prolapse, hypertension, hyperlipidemia, mediastinal mass and arrhythmias.  Apparently, patient presented with worsening shortness of breath and wheezing.  ER provider as the hospitalist team to admit patient.  Patient is in significant respiratory distress and, therefore, cannot give any history.  Pertinent labs revealed potassium of 2.6, cardiac BNP of 61.2, UA done revealed moderate hemoglobin and leukocyte with specific gravity of 1.013.  Leukocytosis of 12.7 is noted.  Chest x-ray revealed "Mild right basilar and moderate severity left basilar atelectasis and/or infiltrate.  Focal opacification within the lateral aspect of the left lung base which is more prominent on the current study and may represent a more focal area of consolidation. An area of loculated pleural fluid cannot be excluded.   Stable cardiomegaly".  CT angio chest revealed "Moderate severity left lower lobe atelectasis and/or infiltrate with mild left-sided volume loss.  Multiple stable subcentimeter right upper lobe and right middle lobe noncalcified lung nodules".  ED Course: On presentation to the hospital, temperature was 99 primary, heart rate of 102, respiratory rate of 20 to 30/min, blood pressure of 89-140/55-81 mmHg and O2 sat of 86 to 100%.  Patient has been on various oxygen supplementation, from 3 L to 6 L/min via nasal cannula to high flow oxygen.  Patient has had intermittent episodes of severe respiratory distress as documented above.  Discussed  option of ICU involvement with the emergency room provider, Dr. Darl Householder.  Pertinent labs: Labs revealed sodium of 141, potassium of 3.6, chloride 104, BUN of 15, creatinine of 1 with blood sugar of 187.  UA is as documented above.  EKG: Independently reviewed.   Imaging: independently reviewed.   Review of Systems: Patient is unable to give any significant history.    Past Medical History:  Diagnosis Date  . Arrhythmia   . Bronchiectasis march 2012   On CT chest. RUL. Mild  . CHF (congestive heart failure) ([place])   . Chicken pox   . Chronic diastolic heart failure ([place])   . Chronic respiratory failure ([place])    Followed in Pulmonary clinic/ [facility]/ Ramaswamy  - 06/30/2012 desat to 86%  RA walking 50 ft, recovered to 90% at rest - 06/30/2012  Walked 1lpm x 3 laps @ 185 ft each stopped due to  Sob, no desat  rec 02 2lpm with activity and sleeping, ok at rest   . COPD (chronic obstructive pulmonary disease) ([place])     FEV-1 in 2008 was 63% with a diffusion capacity of 33%.   Marland Kitchen Dyspnea   . Generalized anxiety disorder   . History of cervical cancer 1982  . Hyperlipidemia   . Hypertension   . Leg swelling    Venous doppler right 07/21/12 >>Neg    . 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 Medical history non-contributory   . Medical non-compliance   . MITRAL VALVE PROLAPSE   . Obesity, unspecified   . Pulmonary nodule 03/2010   87mm RUL and RLL 1st seen march  2012 CT, ?progression 12/2012 CT  . Seasonal allergies   . Tobacco abuse     Smokes one pack a day since age 42.    Past Surgical History:  Procedure Laterality Date  . TOTAL ABDOMINAL HYSTERECTOMY       reports that she has been smoking cigarettes. She has a 12.50 pack-year smoking history. She has never used smokeless tobacco. She reports current alcohol use. She reports that she does not use drugs.  Allergies  Allergen Reactions  . Ambien [Zolpidem Tartrate] Other (See  Comments)    Causes nightmares    Family History  Problem Relation Age of Onset  . Other Father        MVA  . Esophageal cancer Mother      Prior to Admission medications   Medication Sig Start Date End Date Taking? Authorizing Provider  acetaminophen (TYLENOL) 325 MG tablet Take 2 tablets (650 mg total) by mouth every 6 (six) hours as needed for mild pain or fever. 02/02/19   Swayze, Ava, DO  acetaZOLAMIDE (DIAMOX) 250 MG tablet Take 1 tablet (250 mg total) by mouth 2 (two) times daily. 09/05/18   Nita Sells, MD  albuterol (PROVENTIL HFA;VENTOLIN HFA) 108 (90 Base) MCG/ACT inhaler Inhale 2 puffs into the lungs every 6 (six) hours as needed for wheezing or shortness of breath. 12/24/17   Brand Males, MD  ALPRAZolam Duanne Moron) 0.25 MG tablet Take 1 tablet (0.25 mg total) by mouth 3 (three) times daily as needed for anxiety. 02/02/19   Swayze, Ava, DO  arformoterol (BROVANA) 15 MCG/2ML NEBU Take 2 mLs (15 mcg total) by nebulization 2 (two) times daily. 02/02/19   Swayze, Ava, DO  budesonide (PULMICORT) 0.25 MG/2ML nebulizer solution Take 2 mLs (0.25 mg total) by nebulization 2 (two) times daily. 02/02/19   Swayze, Ava, DO  fluticasone (FLONASE) 50 MCG/ACT nasal spray SPRAY 2 SPRAYS INTO EACH NOSTRIL EVERY DAY Patient taking differently: Place 2 sprays into both nostrils daily.  01/11/19   Brand Males, MD  Fluticasone-Umeclidin-Vilant (TRELEGY ELLIPTA) 100-62.5-25 MCG/INH AEPB Inhale 1 puff into the lungs daily. 07/16/18   Brand Males, MD  furosemide (LASIX) 40 MG tablet Take 40 mg by mouth daily.     [provider]  GLUCOCOM LANCETS 33G MISC Use with Test Strips to take blood sugars 07/20/15   Burns, Claudina Lick, MD  guaiFENesin (MUCINEX) 600 MG 12 hr tablet Take 1 tablet (600 mg total) by mouth 2 (two) times daily. 01/04/19   Martyn Ehrich, NP  insulin aspart (NOVOLOG) 100 UNIT/ML injection Inject 3 Units into the skin 3 (three) times daily with meals. 02/02/19    Swayze, Ava, DO  insulin glargine (LANTUS) 100 UNIT/ML injection Inject 0.35 mLs (35 Units total) into the skin at bedtime for 5 days, THEN 0.3 mLs (30 Units total) at bedtime for 5 days, THEN 0.25 mLs (25 Units total) at bedtime. 02/02/19 05/22/19  Swayze, Ava, DO  Insulin Pen Needle (BD PEN NEEDLE NANO U/F) 32G X 4 MM MISC USE TO ADMINISTER LANTUS INSULIN 05/08/16   Binnie Rail, MD  iron polysaccharides (NIFEREX) 150 MG capsule Take 1 capsule (150 mg total) by mouth daily. 02/03/19   Swayze, Ava, DO  pantoprazole (PROTONIX) 40 MG tablet Take 1 tablet (40 mg total) by mouth daily. 01/26/18   Binnie Rail, MD  PARoxetine (PAXIL) 40 MG tablet Take 1 tablet (40 mg total) by mouth every morning. 02/03/19   Swayze, Ava, DO  predniSONE (DELTASONE) 20 MG  tablet Take 2 tablets (40 mg total) by mouth daily with breakfast for 5 days, THEN 1 tablet (20 mg total) daily with breakfast for 5 days, THEN 0.5 tablets (10 mg total) daily with breakfast for 5 days. 02/02/19 02/17/19  Swayze, Ava, DO  umeclidinium bromide (INCRUSE ELLIPTA) 62.5 MCG/INH AEPB Inhale 1 puff into the lungs daily. 02/03/19   Swayze, Ava, DO    Physical Exam: Vitals:   02/16/19 2004 02/16/19 2120 02/16/19 2200 02/16/19 2240  BP: 111/60 122/76 140/81 (!) 106/59  Pulse:  (!) 109    Resp: 20 (!) 23 (!) 21 (!) 25  Temp:      TempSrc:      SpO2:  94%    Weight:      Height:        Constitutional:  . Appears calm and comfortable.  Patient is morbidly obese. Eyes:  . No pallor. No jaundice.  ENMT:  . external ears, nose appear normal Neck:  . Neck is supple. No JVD Respiratory:        Decreased air entry.        Severe respiratory distress. Cardiovascular:  . S1S2 Abdomen:  . Abdomen is morbidly obese soft and non tender. Organs are difficult to assess. Neurologic:  . Awake and alert.  Wt Readings from Last 3 Encounters:  02/16/19 94.9 kg  02/01/19 93 kg  11/06/18 95.5 kg    I have personally reviewed following labs and  imaging studies  Labs on Admission:  CBC: Recent Labs  Lab 02/16/19 1746 02/16/19 1807 02/16/19 2301  WBC 12.7*  --   --   NEUTROABS 10.4*  --   --   HGB 11.4* 14.3  14.3 11.6*  HCT 43.1 42.0  42.0 34.0*  MCV 77.2*  --   --   PLT PLATELET CLUMPS NOTED ON SMEAR, COUNT APPEARS ADEQUATE  --   --    Basic Metabolic Panel: Recent Labs  Lab 02/16/19 1746 02/16/19 1807 02/16/19 2301  NA 141 141  141 141  K 2.6* 2.6*  2.7* 3.2*  CL 104 104  --   CO2 24  --   --   GLUCOSE 190* 187*  --   BUN 15 15  --   CREATININE 1.11* 1.00  --   CALCIUM 8.8*  --   --    Liver Function Tests: Recent Labs  Lab 02/16/19 1746  AST 18  ALT 24  ALKPHOS 92  BILITOT 0.5  PROT 6.4*  ALBUMIN 3.4*   No results for input(s): LIPASE, AMYLASE in the last 168 hours. No results for input(s): AMMONIA in the last 168 hours. Coagulation Profile: No results for input(s): INR, PROTIME in the last 168 hours. Cardiac Enzymes: No results for input(s): CKTOTAL, CKMB, CKMBINDEX, TROPONINI in the last 168 hours. BNP (last 3 results) No results for input(s): PROBNP in the last 8760 hours. HbA1C: No results for input(s): HGBA1C in the last 72 hours. CBG: No results for input(s): GLUCAP in the last 168 hours. Lipid Profile: No results for input(s): CHOL, HDL, LDLCALC, TRIG, CHOLHDL, LDLDIRECT in the last 72 hours. Thyroid Function Tests: No results for input(s): TSH, T4TOTAL, FREET4, T3FREE, THYROIDAB in the last 72 hours. Anemia Panel: No results for input(s): VITAMINB12, FOLATE, FERRITIN, TIBC, IRON, RETICCTPCT in the last 72 hours. Urine analysis:    Component Value Date/Time   COLORURINE YELLOW 02/16/2019 1944   APPEARANCEUR HAZY (A) 02/16/2019 1944   LABSPEC 1.013 02/16/2019 1944   PHURINE 6.0 02/16/2019  [place] 02/16/2019 1944   GLUCOSEU NEGATIVE 04/09/2006 1315   [place] (A) 02/16/2019 1944   BILIRUBINUR NEGATIVE 02/16/2019 1944   KETONESUR 5 (A) 02/16/2019 [place] 02/16/2019 1944   UROBILINOGEN 1.0 04/04/2010 2323   NITRITE NEGATIVE 02/16/2019 1944   LEUKOCYTESUR MODERATE (A) 02/16/2019 1944   Sepsis Labs: @LABRCNTIP (procalcitonin:4,lacticidven:4) ) Recent Results (from the past 240 hour(s))  Respiratory Panel by RT PCR (Flu A&B, Covid) - Nasopharyngeal Swab     Status: None   Collection Time: 02/16/19  8:34 PM   Specimen: Nasopharyngeal Swab  Result Value Ref Range Status   SARS Coronavirus 2 by RT PCR NEGATIVE NEGATIVE Final    Comment: (NOTE) SARS-CoV-2 target nucleic acids are NOT DETECTED. The SARS-CoV-2 RNA is generally detectable in upper respiratoy specimens during the acute phase of infection. The lowest concentration of SARS-CoV-2 viral copies this assay can detect is 131 copies/mL. A negative result does not preclude SARS-Cov-2 infection and should not be used as the sole basis for treatment or other patient management decisions. A negative result may occur with  improper specimen collection/handling, submission of specimen other than nasopharyngeal swab, presence of viral mutation(s) within the areas targeted by this assay, and inadequate number of viral copies (<131 copies/mL). A negative result must be combined with clinical observations, patient history, and epidemiological information. The expected result is Negative. Fact Sheet for Patients:  PinkCheek.be Fact Sheet for Healthcare Providers:  GravelBags.it This test is not yet ap proved or cleared by the [place] FDA and  has been authorized for detection and/or diagnosis of SARS-CoV-2 by FDA under an Emergency Use Authorization (EUA). This EUA will remain  in effect (meaning this test can be used) for the duration of the COVID-19 declaration under Section 564(b)(1) of the Act, 21 U.S.C. section 360bbb-3(b)(1), unless the authorization is terminated or revoked sooner.    Influenza A by PCR  NEGATIVE NEGATIVE Final   Influenza B by PCR NEGATIVE NEGATIVE Final    Comment: (NOTE) The Xpert Xpress SARS-CoV-2/FLU/RSV assay is intended as an aid in  the diagnosis of influenza from Nasopharyngeal swab specimens and  should not be used as a sole basis for treatment. Nasal washings and  aspirates are unacceptable for Xpert Xpress SARS-CoV-2/FLU/RSV  testing. Fact Sheet for Patients: PinkCheek.be Fact Sheet for Healthcare Providers: GravelBags.it This test is not yet approved or cleared by the [place] FDA and  has been authorized for detection and/or diagnosis of SARS-CoV-2 by  FDA under an Emergency Use Authorization (EUA). This EUA will remain  in effect (meaning this test can be used) for the duration of the  Covid-19 declaration under Section 564(b)(1) of the Act, 21  U.S.C. section 360bbb-3(b)(1), unless the authorization is  terminated or revoked. Performed at [facility], [place] [address], [place], [place] [address]       Radiological Exams on Admission: CT Angio Chest PE W and/or Wo Contrast  Result Date: 02/16/2019 CLINICAL DATA:  Shortness of breath. EXAM: CT ANGIOGRAPHY CHEST WITH CONTRAST TECHNIQUE: Multidetector CT imaging of the chest was performed using the standard protocol during bolus administration of intravenous contrast. Multiplanar CT image reconstructions and MIPs were obtained to evaluate the vascular anatomy. CONTRAST:  45mL OMNIPAQUE IOHEXOL 350 MG/ML SOLN COMPARISON:  January 21, 2019 FINDINGS: Cardiovascular: There is moderate severity calcification of the thoracic aorta. Marked severity tortuosity of the descending thoracic aorta is seen. Multiple areas of artifact are seen involving the  subsegmental pulmonary arteries. No intraluminal filling defects are clearly identified. Normal heart size. No pericardial effusion. Mediastinum/Nodes: There is mild bilateral hilar lymphadenopathy. Mild  lymphadenopathy is also seen within the region of the AP window. Lungs/Pleura: A stable 7 mm calcified lung nodule is seen within the anterolateral aspect of the right upper lobe (axial CT image 68, CT series number 7). An additional stable 4 mm anterolateral right upper lobe noncalcified lung nodule is seen (axial CT image 36, CT series number 7). A stable 6 mm noncalcified lung nodule is seen within the anterolateral aspect of the right middle lobe (axial CT image 84, CT series number 7). Moderate severity atelectasis and/or infiltrate is seen within the left lower lobe with mild left-sided volume loss. There is no evidence of a pleural effusion or pneumothorax. Upper Abdomen: No acute abnormality. Musculoskeletal: Multilevel degenerative changes seen throughout the thoracic spine. Review of the MIP images confirms the above findings. IMPRESSION: 1. Moderate severity left lower lobe atelectasis and/or infiltrate with mild left-sided volume loss. 2. Multiple stable subcentimeter right upper lobe and right middle lobe noncalcified lung nodules. Electronically Signed   By: Virgina Norfolk M.D.   On: 02/16/2019 22:51   DG Chest Port 1 View  Result Date: 02/16/2019 CLINICAL DATA:  Shortness of breath. EXAM: PORTABLE CHEST 1 VIEW COMPARISON:  January 24, 2018 FINDINGS: Mild, chronic appearing increased interstitial lung markings are noted bilaterally. Mild atelectasis and/or infiltrate is seen within the right lung base with mild to moderate severity left basilar atelectasis and/or infiltrate noted. A focal area of opacification is seen along the lateral aspect of the left lung base. There is a small left pleural effusion. No pneumothorax is seen. The cardiac silhouette is markedly enlarged. Degenerative changes seen throughout the thoracic spine. IMPRESSION: 1. Mild right basilar and moderate severity left basilar atelectasis and/or infiltrate. 2. Focal opacification within the lateral aspect of the left lung base  which is more prominent on the current study and may represent a more focal area of consolidation. An area of loculated pleural fluid cannot be excluded. 3. Stable cardiomegaly. Electronically Signed   By: Virgina Norfolk M.D.   On: 02/16/2019 18:04    EKG: Independently reviewed.   Active Problems:   Acute on chronic respiratory failure (HCC)   Assessment/Plan Acute on chronic respiratory distress/COPD with exacerbation: -Admit patient to progressive unit. -Start patient on IV Solu-Medrol and nebulizer treatment -IV antibiotics -Discussed with the ICU team.  If the patient decompensates further, ICU will assess as per ICU team. -Further management depend on hospital course.  Hypokalemia: -Monitor and replete.  Possible pneumonia: -IV antibiotics. -Panculture patient.  Morbid obesity: -Further management on outpatient basis.  DVT prophylaxis: Subacute Lovenox Code Status: Full code Family Communication:  Disposition Plan: This will depend on hospital course Consults called: Discussed with ICU team. Admission status: Inpatient  Time spent: 65 minutes  Dana Allan, MD  [facility] Pager #: [phone] 7PM-7AM contact night coverage as above  02/17/2019, 12:00 AM

## 2019-02-17 NOTE — Progress Notes (Signed)
Pt arrived from ED via stretcher on bi-pap. AO x4. Vitals stable, breathing even and unlabored in Bi-pap. CHG bath completed, pt connected to tele and CCMD notified. Oriented pt to room and call bell system. Call bell within reach. Pt kept pulling her bi-pap tubing off, mittens applied to bilateral hands. Call bell within reach. Will continue to monitor.

## 2019-02-18 DIAGNOSIS — J189 Pneumonia, unspecified organism: Principal | ICD-10-CM

## 2019-02-18 DIAGNOSIS — R0902 Hypoxemia: Secondary | ICD-10-CM

## 2019-02-18 LAB — GLUCOSE, CAPILLARY
Glucose-Capillary: 266 mg/dL — ABNORMAL HIGH (ref 70–99)
Glucose-Capillary: 319 mg/dL — ABNORMAL HIGH (ref 70–99)
Glucose-Capillary: 212 mg/dL — ABNORMAL HIGH (ref 70–99)
Glucose-Capillary: 280 mg/dL — ABNORMAL HIGH (ref 70–99)

## 2019-02-18 LAB — URINE CULTURE

## 2019-02-18 MED ORDER — ALPRAZOLAM 0.25 MG PO TABS
0.2500 mg | ORAL_TABLET | Freq: Three times a day (TID) | ORAL | Status: DC
  Administered 2019-02-18 – 2019-02-24 (×15): 0.25 mg via ORAL
  Filled 2019-02-18 (×16): qty 1

## 2019-02-18 MED ORDER — PAROXETINE HCL 20 MG PO TABS
40.0000 mg | ORAL_TABLET | Freq: Every morning | ORAL | Status: DC
  Administered 2019-02-18 – 2019-02-24 (×5): 40 mg via ORAL
  Filled 2019-02-18 (×7): qty 2

## 2019-02-18 MED ORDER — ARFORMOTEROL TARTRATE 15 MCG/2ML IN NEBU
15.0000 ug | INHALATION_SOLUTION | Freq: Two times a day (BID) | RESPIRATORY_TRACT | Status: DC
  Administered 2019-02-18 – 2019-02-24 (×12): 15 ug via RESPIRATORY_TRACT
  Filled 2019-02-18 (×12): qty 2

## 2019-02-18 MED ORDER — ACETAZOLAMIDE 250 MG PO TABS
250.0000 mg | ORAL_TABLET | Freq: Two times a day (BID) | ORAL | Status: DC
  Administered 2019-02-18 – 2019-02-24 (×10): 250 mg via ORAL
  Filled 2019-02-18 (×14): qty 1

## 2019-02-18 MED ORDER — BUDESONIDE 0.25 MG/2ML IN SUSP: 0.2500 mg | Freq: Two times a day (BID) | RESPIRATORY_TRACT | Status: DC

## 2019-02-18 MED ORDER — FLUTICASONE-UMECLIDIN-VILANT 100-62.5-25 MCG/INH IN AEPB: 1.0000 | INHALATION_SPRAY | Freq: Every day | RESPIRATORY_TRACT | Status: DC

## 2019-02-18 MED ORDER — FUROSEMIDE 40 MG PO TABS
40.0000 mg | ORAL_TABLET | Freq: Every day | ORAL | Status: DC
  Administered 2019-02-18 – 2019-02-20 (×3): 40 mg via ORAL
  Filled 2019-02-18 (×4): qty 1

## 2019-02-18 MED ORDER — ALBUTEROL SULFATE (2.5 MG/3ML) 0.083% IN NEBU
3.0000 mL | INHALATION_SOLUTION | Freq: Four times a day (QID) | RESPIRATORY_TRACT | Status: DC | PRN
  Administered 2019-02-21: 3 mL via RESPIRATORY_TRACT
  Filled 2019-02-18: qty 3

## 2019-02-18 MED ORDER — INSULIN GLARGINE 100 UNIT/ML ~~LOC~~ SOLN
35.0000 [IU] | Freq: Every day | SUBCUTANEOUS | Status: DC
  Administered 2019-02-18 – 2019-02-19 (×2): 35 [IU] via SUBCUTANEOUS
  Filled 2019-02-18 (×3): qty 0.35

## 2019-02-18 MED ORDER — INSULIN ASPART 100 UNIT/ML ~~LOC~~ SOLN
6.0000 [IU] | Freq: Three times a day (TID) | SUBCUTANEOUS | Status: DC
  Administered 2019-02-18 – 2019-02-20 (×6): 6 [IU] via SUBCUTANEOUS

## 2019-02-18 NOTE — Plan of Care (Signed)
  Problem: Education: Goal: Knowledge of General Education information will improve Description: Including pain rating scale, medication(s)/side effects and non-pharmacologic comfort measures Outcome: Not Progressing   

## 2019-02-18 NOTE — Progress Notes (Signed)
Inpatient Diabetes Program Recommendations  AACE/ADA: New Consensus Statement on Inpatient Glycemic Control (2015)  Target Ranges:  Prepandial:   less than 140 mg/dL      Peak postprandial:   less than 180 mg/dL (1-2 hours)      Critically ill patients:  140 - 180 mg/dL   Lab Results  Component Value Date   GLUCAP 319 (H) 02/18/2019   HGBA1C 9.1 (H) 02/17/2019    Review of Glycemic Control Results for [name], [name] (MRN [id number]) as of 02/18/2019 12:20  Ref. Range 02/17/2019 11:30 02/17/2019 16:16 02/17/2019 21:32 02/18/2019 06:23 02/18/2019 11:04  Glucose-Capillary Latest Ref Range: 70 - 99 mg/dL 209 (H) 218 (H) 287 (H) 266 (H) 319 (H)   Diabetes history: DM2 Outpatient Diabetes medications: Lantus 25 units daily and Novolog 3 units tid with meals Current orders for Inpatient glycemic control:  Novolog sensitive tid with meals and HS Inpatient Diabetes Program Recommendations:    Please increase Lantus to 35 units and increase Novolog meal coverage to 6 units tid with meals.   Thanks,  Adah Perl, RN, BC-ADM Inpatient Diabetes Coordinator Pager [phone] (8a-5p)

## 2019-02-18 NOTE — Progress Notes (Addendum)
PROGRESS NOTE    [name]  [id number] DOB: Aug 03, 1945 DOA: 02/16/2019 PCP: Binnie Rail, MD   Brief Narrative:74 year old female past medical history significant for COPD on 3 - 6 L of oxygen according to the Dr. Darl Householder, diastolic CHF, obesity, mitral valve prolapse, hypertension, hyperlipidemia, mediastinal mass and arrhythmias.  Apparently, patient presented with worsening shortness of breath and wheezing.  ER provider as the hospitalist team to admit patient.  Patient is in significant respiratory distress and, therefore, cannot give any history.  Pertinent labs revealed potassium of 2.6, cardiac BNP of 61.2, UA done revealed moderate hemoglobin and leukocyte with specific gravity of 1.013.  Leukocytosis of 12.7 is noted.  Chest x-ray revealed "Mild right basilar and moderate severity left basilar atelectasis and/or infiltrate.  Focal opacification within the lateral aspect of the left lung base which is more prominent on the current study and may represent a more focal area of consolidation. An area of loculated pleural fluid cannot be excluded.   Stable cardiomegaly".  CT angio chest revealed "Moderate severity left lower lobe atelectasis and/or infiltrate with mild left-sided volume loss.  Multiple stable subcentimeter right upper lobe and right middle lobe noncalcified lung nodules".  ED Course: On presentation to the hospital, temperature was 99 primary, heart rate of 102, respiratory rate of 20 to 30/min, blood pressure of 89-140/55-81 mmHg and O2 sat of 86 to 100%.  Patient has been on various oxygen supplementation, from 3 L to 6 L/min via nasal cannula to high flow oxygen.  Patient has had intermittent episodes of severe respiratory distress as documented above.  Discussed option of ICU involvement with the emergency room provider, Dr. Darl Householder.   Assessment & Plan:   Active Problems:   Acute on chronic respiratory failure (HCC)  #1 acute hypoxic respiratory failure secondary to COPD  exacerbation/HCAP-patient is still struggling to breathe and pursing her lips while talking.  Still with diffuse wheezing.  We will continue IV Solu-Medrol and nebulizer treatments. She is intermittently tachycardic and tachypneic. CT of the chestleft lower lobe atelectasis versus infiltrates with mild left-sided volume loss, multiple stable subcentimeter right upper lobe right middle lobe noncalcified lung nodules. Restart home breathing treatments. Continue cefepime BiPAP as needed. PT consult out of bed ambulate Likely will need SNF on discharge.  #2 diastolic heart failure-patient takes Diamox to 50 mg twice a day with Lasix 40 mg daily at home.  Restarted.  #3 anxiety Xanax restarted.  #4 type 2 diabetes increase Lantus to 35 units and added NovoLog 6 units 3 times a day appreciate diabetic coordinator input. CBG (last 3)  Recent Labs    02/17/19 2132 02/18/19 0623 02/18/19 1104  GLUCAP 287* 266* 319*   #5 depression restart Paxil.   Pressure Injury 08/30/18 Buttocks Right;Medial Stage II -  Partial thickness loss of dermis presenting as a shallow open ulcer with a red, pink wound bed without slough. (Active)  08/30/18 2134  Location: Buttocks  Location Orientation: Right;Medial  Staging: Stage II -  Partial thickness loss of dermis presenting as a shallow open ulcer with a red, pink wound bed without slough.  Wound Description (Comments):   Present on Admission:      Estimated body mass index is 35.31 kg/m as calculated from the following:   Height as of this encounter: 5\' 4"  (1.626 m).   Weight as of this encounter: 93.3 kg.  DVT prophylaxis: lovenox Code Status: full Family Communication: called son no response Disposition Plan: admitted from home PT consult  pending may need snf ..still on prn bipap and iv steroids    Consultants:  none Procedures: none Antimicrobials:cefepime  Subjective: C/o difficulty breathing unable to talk in full sentences    Objective: Vitals:   02/18/19 0601 02/18/19 0814 02/18/19 1100 02/18/19 1150  BP: 104/71 (!) 101/53 105/75   Pulse: 88 94 86 88  Resp: 17 18 16 20   Temp:  97.9 F (36.6 C) (!) 97.5 F (36.4 C)   TempSrc:  Oral Oral   SpO2: 95% 97% 100% 100%  Weight:      Height:        Intake/Output Summary (Last 24 hours) at 02/18/2019 1317 Last data filed at 02/18/2019 1100 Gross per 24 hour  Intake 660 ml  Output 850 ml  Net -190 ml   Filed Weights   02/16/19 1800 02/17/19 0225 02/18/19 0527  Weight: 94.9 kg 92.2 kg 93.3 kg    Examination:  General exam: Appears calm and comfortable  Respiratory system: diffuse wheezing  to auscultation. Respiratory effort normal. Cardiovascular system: S1 & S2 heard, RRR. No JVD, murmurs, rubs, gallops or clicks. No pedal edema. Gastrointestinal system: Abdomen is nondistended, soft and nontender. No organomegaly or masses felt. Normal bowel sounds heard. Central nervous system: Alert and oriented. No focal neurological deficits. Extremities: Symmetric 5 x 5 power. Skin: No rashes, lesions or ulcers Psychiatry: Judgement and insight appear normal. Mood & affect appropriate.     Data Reviewed: I have personally reviewed following labs and imaging studies  CBC: Recent Labs  Lab 02/16/19 1746 02/16/19 1746 02/16/19 1807 02/16/19 2301 02/17/19 0006 02/17/19 0056 02/17/19 0320  WBC 12.7*  --   --   --   --  10.6* 9.4  NEUTROABS 10.4*  --   --   --   --   --   --   HGB 11.4*   < > 14.3  14.3 11.6* 11.9* 11.1* 9.8*  HCT 43.1   < > 42.0  42.0 34.0* 35.0* 42.5 36.9  MCV 77.2*  --   --   --   --  77.3* 75.6*  PLT PLATELET CLUMPS NOTED ON SMEAR, COUNT APPEARS ADEQUATE  --   --   --   --  285 276   < > = values in this interval not displayed.   Basic Metabolic Panel: Recent Labs  Lab 02/16/19 1746 02/16/19 1746 02/16/19 1807 02/16/19 2301 02/17/19 0006 02/17/19 0056 02/17/19 0320 02/17/19 0650  NA 141   < > 141  141 141 142 140  --   140  K 2.6*   < > 2.6*  2.7* 3.2* 3.2* 3.4*  --  3.8  CL 104  --  104  --   --  111  --  112*  CO2 24  --   --   --   --  21*  --  19*  GLUCOSE 190*  --  187*  --   --  157*  --  243*  BUN 15  --  15  --   --  12  --  13  CREATININE 1.11*  --  1.00  --   --  0.80 0.81 0.79  CALCIUM 8.8*  --   --   --   --  8.3*  --  8.2*  MG  --   --   --   --   --   --  1.7  --   PHOS  --   --   --   --   --   --  2.1*  --    < > = values in this interval not displayed.   GFR: Estimated Creatinine Clearance: 71.4 mL/min (by C-G formula based on SCr of 0.79 mg/dL). Liver Function Tests: Recent Labs  Lab 02/16/19 1746  AST 18  ALT 24  ALKPHOS 92  BILITOT 0.5  PROT 6.4*  ALBUMIN 3.4*   No results for input(s): LIPASE, AMYLASE in the last 168 hours. No results for input(s): AMMONIA in the last 168 hours. Coagulation Profile: No results for input(s): INR, PROTIME in the last 168 hours. Cardiac Enzymes: No results for input(s): CKTOTAL, CKMB, CKMBINDEX, TROPONINI in the last 168 hours. BNP (last 3 results) No results for input(s): PROBNP in the last 8760 hours. HbA1C: Recent Labs    02/17/19 0320  HGBA1C 9.1*   CBG: Recent Labs  Lab 02/17/19 1130 02/17/19 1616 02/17/19 2132 02/18/19 0623 02/18/19 1104  GLUCAP 209* 218* 287* 266* 319*   Lipid Profile: No results for input(s): CHOL, HDL, LDLCALC, TRIG, CHOLHDL, LDLDIRECT in the last 72 hours. Thyroid Function Tests: No results for input(s): TSH, T4TOTAL, FREET4, T3FREE, THYROIDAB in the last 72 hours. Anemia Panel: No results for input(s): VITAMINB12, FOLATE, FERRITIN, TIBC, IRON, RETICCTPCT in the last 72 hours. Sepsis Labs: Recent Labs  Lab 02/16/19 1746 02/17/19 0320  LATICACIDVEN 2.1* 0.9    Recent Results (from the past 240 hour(s))  Blood Culture (routine x 2)     Status: None (Preliminary result)   Collection Time: 02/16/19  5:45 PM   Specimen: BLOOD  Result Value Ref Range Status   Specimen Description BLOOD RIGHT  ANTECUBITAL  Final   Special Requests   Final    BOTTLES DRAWN AEROBIC ONLY Blood Culture results may not be optimal due to an inadequate volume of blood received in culture bottles   Culture   Final    NO GROWTH 2 DAYS Performed at [facility] [address], [place], [place] [address]    Report Status PENDING  Incomplete  Blood Culture (routine x 2)     Status: None (Preliminary result)   Collection Time: 02/16/19  5:45 PM   Specimen: BLOOD  Result Value Ref Range Status   Specimen Description BLOOD LEFT ANTECUBITAL  Final   Special Requests   Final    BOTTLES DRAWN AEROBIC AND ANAEROBIC Blood Culture adequate volume   Culture   Final    NO GROWTH 2 DAYS Performed at [facility], [place] [address] [place], [place] [address]    Report Status PENDING  Incomplete  Urine culture     Status: Abnormal   Collection Time: 02/16/19  7:34 PM   Specimen: In/Out Cath Urine  Result Value Ref Range Status   Specimen Description IN/OUT CATH URINE  Final   Special Requests   Final    NONE Performed at [facility], [place] [address] [place], [place] [address]    Culture MULTIPLE SPECIES PRESENT, SUGGEST RECOLLECTION (A)  Final   Report Status 02/18/2019 FINAL  Final  Respiratory Panel by RT PCR (Flu A&B, Covid) - Nasopharyngeal Swab     Status: None   Collection Time: 02/16/19  8:34 PM   Specimen: Nasopharyngeal Swab  Result Value Ref Range Status   SARS Coronavirus 2 by RT PCR NEGATIVE NEGATIVE Final    Comment: (NOTE) SARS-CoV-2 target nucleic acids are NOT DETECTED. The SARS-CoV-2 RNA is generally detectable in upper respiratoy specimens during the acute phase of infection. The lowest concentration of  SARS-CoV-2 viral copies this assay can detect is 131 copies/mL. A negative result does not preclude SARS-Cov-2 infection and should not be used as the sole basis for treatment or other patient management decisions. A negative result may occur with  improper specimen  collection/handling, submission of specimen other than nasopharyngeal swab, presence of viral mutation(s) within the areas targeted by this assay, and inadequate number of viral copies (<131 copies/mL). A negative result must be combined with clinical observations, patient history, and epidemiological information. The expected result is Negative. Fact Sheet for Patients:  PinkCheek.be Fact Sheet for Healthcare Providers:  GravelBags.it This test is not yet ap proved or cleared by the [place] FDA and  has been authorized for detection and/or diagnosis of SARS-CoV-2 by FDA under an Emergency Use Authorization (EUA). This EUA will remain  in effect (meaning this test can be used) for the duration of the COVID-19 declaration under Section 564(b)(1) of the Act, 21 U.S.C. section 360bbb-3(b)(1), unless the authorization is terminated or revoked sooner.    Influenza A by PCR NEGATIVE NEGATIVE Final   Influenza B by PCR NEGATIVE NEGATIVE Final    Comment: (NOTE) The Xpert Xpress SARS-CoV-2/FLU/RSV assay is intended as an aid in  the diagnosis of influenza from Nasopharyngeal swab specimens and  should not be used as a sole basis for treatment. Nasal washings and  aspirates are unacceptable for Xpert Xpress SARS-CoV-2/FLU/RSV  testing. Fact Sheet for Patients: PinkCheek.be Fact Sheet for Healthcare Providers: GravelBags.it This test is not yet approved or cleared by the [place] FDA and  has been authorized for detection and/or diagnosis of SARS-CoV-2 by  FDA under an Emergency Use Authorization (EUA). This EUA will remain  in effect (meaning this test can be used) for the duration of the  Covid-19 declaration under Section 564(b)(1) of the Act, 21  U.S.C. section 360bbb-3(b)(1), unless the authorization is  terminated or revoked. Performed at [facility], [place] [address] [place], [place] [address]   SARS CORONAVIRUS 2 (TAT 6-24 HRS) Nasopharyngeal Nasopharyngeal Swab     Status: None   Collection Time: 02/17/19 12:50 AM   Specimen: Nasopharyngeal Swab  Result Value Ref Range Status   SARS Coronavirus 2 NEGATIVE NEGATIVE Final    Comment: (NOTE) SARS-CoV-2 target nucleic acids are NOT DETECTED. The SARS-CoV-2 RNA is generally detectable in upper and lower respiratory specimens during the acute phase of infection. Negative results do not preclude SARS-CoV-2 infection, do not rule out co-infections with other pathogens, and should not be used as the sole basis for treatment or other patient management decisions. Negative results must be combined with clinical observations, patient history, and epidemiological information. The expected result is Negative. Fact Sheet for Patients: SugarRoll.be Fact Sheet for Healthcare Providers: https://www.woods-Kassem Kibbe.com/ This test is not yet approved or cleared by the [place] FDA and  has been authorized for detection and/or diagnosis of SARS-CoV-2 by FDA under an Emergency Use Authorization (EUA). This EUA will remain  in effect (meaning this test can be used) for the duration of the COVID-19 declaration under Section 56 4(b)(1) of the Act, 21 U.S.C. section 360bbb-3(b)(1), unless the authorization is terminated or revoked sooner. Performed at [facility], [place] [address], [place], [place] [address]   MRSA PCR Screening     Status: None   Collection Time: 02/17/19  3:25 AM   Specimen: Nasopharyngeal Swab  Result Value Ref Range Status   MRSA by PCR NEGATIVE NEGATIVE Final    Comment:  The GeneXpert MRSA Assay (FDA approved for NASAL specimens only), is one component of a comprehensive MRSA colonization surveillance program. It is not intended to diagnose MRSA infection nor to guide or monitor treatment for MRSA  infections. Performed at [facility], [place] [address], [place], [place] [address]          Radiology Studies: CT Angio Chest PE W and/or Wo Contrast  Result Date: 02/16/2019 CLINICAL DATA:  Shortness of breath. EXAM: CT ANGIOGRAPHY CHEST WITH CONTRAST TECHNIQUE: Multidetector CT imaging of the chest was performed using the standard protocol during bolus administration of intravenous contrast. Multiplanar CT image reconstructions and MIPs were obtained to evaluate the vascular anatomy. CONTRAST:  76mL OMNIPAQUE IOHEXOL 350 MG/ML SOLN COMPARISON:  January 21, 2019 FINDINGS: Cardiovascular: There is moderate severity calcification of the thoracic aorta. Marked severity tortuosity of the descending thoracic aorta is seen. Multiple areas of artifact are seen involving the subsegmental pulmonary arteries. No intraluminal filling defects are clearly identified. Normal heart size. No pericardial effusion. Mediastinum/Nodes: There is mild bilateral hilar lymphadenopathy. Mild lymphadenopathy is also seen within the region of the AP window. Lungs/Pleura: A stable 7 mm calcified lung nodule is seen within the anterolateral aspect of the right upper lobe (axial CT image 68, CT series number 7). An additional stable 4 mm anterolateral right upper lobe noncalcified lung nodule is seen (axial CT image 36, CT series number 7). A stable 6 mm noncalcified lung nodule is seen within the anterolateral aspect of the right middle lobe (axial CT image 84, CT series number 7). Moderate severity atelectasis and/or infiltrate is seen within the left lower lobe with mild left-sided volume loss. There is no evidence of a pleural effusion or pneumothorax. Upper Abdomen: No acute abnormality. Musculoskeletal: Multilevel degenerative changes seen throughout the thoracic spine. Review of the MIP images confirms the above findings. IMPRESSION: 1. Moderate severity left lower lobe atelectasis and/or infiltrate with mild left-sided  volume loss. 2. Multiple stable subcentimeter right upper lobe and right middle lobe noncalcified lung nodules. Electronically Signed   By: Virgina Norfolk M.D.   On: 02/16/2019 22:51   DG Chest Port 1 View  Result Date: 02/16/2019 CLINICAL DATA:  Shortness of breath. EXAM: PORTABLE CHEST 1 VIEW COMPARISON:  January 24, 2018 FINDINGS: Mild, chronic appearing increased interstitial lung markings are noted bilaterally. Mild atelectasis and/or infiltrate is seen within the right lung base with mild to moderate severity left basilar atelectasis and/or infiltrate noted. A focal area of opacification is seen along the lateral aspect of the left lung base. There is a small left pleural effusion. No pneumothorax is seen. The cardiac silhouette is markedly enlarged. Degenerative changes seen throughout the thoracic spine. IMPRESSION: 1. Mild right basilar and moderate severity left basilar atelectasis and/or infiltrate. 2. Focal opacification within the lateral aspect of the left lung base which is more prominent on the current study and may represent a more focal area of consolidation. An area of loculated pleural fluid cannot be excluded. 3. Stable cardiomegaly. Electronically Signed   By: Virgina Norfolk M.D.   On: 02/16/2019 18:04        Scheduled Meds: . acetaZOLAMIDE  250 mg Oral BID  . ALPRAZolam  0.25 mg Oral TID  . arformoterol  15 mcg Nebulization BID  . budesonide (PULMICORT) nebulizer solution  0.25 mg Nebulization BID  . budesonide  0.25 mg Nebulization BID  . enoxaparin (LOVENOX) injection  40 mg Subcutaneous Q24H  . furosemide  40 mg Oral  Daily  . insulin aspart  0-5 Units Subcutaneous QHS  . insulin aspart  0-9 Units Subcutaneous TID WC  . insulin aspart  3 Units Subcutaneous TID WC  . insulin glargine  25 Units Subcutaneous QHS  . methylPREDNISolone (SOLU-MEDROL) injection  40 mg Intravenous Q6H  . PARoxetine  40 mg Oral q morning - 10a  . potassium chloride  40 mEq Oral Once    Continuous Infusions: . ceFEPime (MAXIPIME) IV 2 g (02/18/19 [id number])     LOS: 2 days     Georgette Shell, MD [facility]  If 7PM-7AM, please contact night-coverage [email or web address] Password [email or web address] 02/18/2019, 1:17 PM

## 2019-02-18 NOTE — Evaluation (Signed)
Physical Therapy Evaluation Patient Details Name: [name] MRN: [id number] DOB: 09-11-1945 Today's Date: 02/18/2019   History of Present Illness  Pt is 63yofemalepmh significant for COPD on 3 - 6 L of oxygen, diastolic CHF, obesity, mitral valve prolapse, hypertension, hyperlipidemia, mediastinal mass and arrhythmias.  Patient presented with worsening shortness of breath and wheezing.  Pt admitted with hypoxic respiratory failure secondary to COPD exacerbation.  Clinical Impression  Pt admitted with above diagnosis. Pt was able to ambulate 5' and transfer with min-guard to min-assist.  She was limited by DOE with decreased O2 sats and increased RR.  Required cues for safety and pursed lip breathing. Pt currently with functional limitations due to the deficits listed below (see PT Problem List). Pt will benefit from skilled PT to increase their independence and safety with mobility to allow discharge to the venue listed below.       Follow Up Recommendations SNF    Equipment Recommendations  Other (comment)(tbd next venue)    Recommendations for Other Services       Precautions / Restrictions Precautions Precautions: Fall Precaution Comments: watch 02      Mobility  Bed Mobility Overal bed mobility: Needs Assistance Bed Mobility: Supine to Sit;Rolling Rolling: Min guard   Supine to sit: Min guard;HOB elevated     General bed mobility comments: cues for safety  Transfers Overall transfer level: Needs assistance Equipment used: None Transfers: Sit to/from Stand Sit to Stand: Min guard         General transfer comment: cues for pursed lip breathing  Ambulation/Gait Ambulation/Gait assistance: Min guard Gait Distance (Feet): 4 Feet Assistive device: 1 person hand held assist Gait Pattern/deviations: Wide base of support;Decreased stride length     General Gait Details: steps to chair ; limited by DOE  [employer]    Modified  [place] (Stroke Patients Only)       Balance Overall balance assessment: Needs assistance Sitting-balance support: No upper extremity supported;Feet supported Sitting balance-Leahy Scale: Good     Standing balance support: During functional activity;Single extremity supported Standing balance-Leahy Scale: Fair                               Pertinent Vitals/Pain Pain Assessment: No/denies pain    Home Living Family/patient expects to be discharged to:: Skilled nursing facility Living Arrangements: Alone Available Help at Discharge: Personal care attendant(2 days week) Type of Home: Apartment Home Access: Ramped entrance     Home Layout: One level Home Equipment: Walker - 4 wheels;Bedside commode;Shower Midwife / Transfers Assistance Needed: walks with a rollator in home  ADL's / Homemaking Assistance Needed: aide helps with heavy meal prep, housekeeping and bathing;  pt could dress self and toliet; could microwave meals        Hand Dominance   Dominant Hand: Right    Extremity/Trunk Assessment   Upper Extremity Assessment Upper Extremity Assessment: Generalized weakness    Lower Extremity Assessment Lower Extremity Assessment: Generalized weakness(Demonstrating at least 3/5 strength but not further assessed due to DOE)    Cervical / Trunk Assessment Cervical / Trunk Assessment: Normal  Communication   Communication: No difficulties  Cognition Arousal/Alertness: Awake/alert Behavior During Therapy: WFL for tasks assessed/performed Overall Cognitive Status: Within Functional Limits for tasks assessed  General Comments: Pt oriented x 4 ; however, still makes confused statements at times      General Comments General comments (skin integrity, edema, etc.): Pt on 7 L HFNC O2;  Had difficulty getting O2 readings at times but when good signal was available sats fluctuated  between 88-99%;  pt with dyspnea of 3/4 with exertion that limited activity; all other VSS    Exercises     Assessment/Plan    PT Assessment Patient needs continued PT services  PT Problem List Decreased strength;Decreased activity tolerance;Decreased balance;Decreased mobility;Decreased knowledge of use of DME;Decreased safety awareness;Decreased knowledge of precautions;Cardiopulmonary status limiting activity       PT Treatment Interventions DME instruction;Gait training;Stair training;Functional mobility training;Therapeutic activities;Therapeutic exercise;Balance training;Neuromuscular re-education;Patient/family education    PT Goals (Current goals can be found in the Care Plan section)  Acute Rehab PT Goals Patient Stated Goal: return home    Frequency Min 2X/week   Barriers to discharge Decreased caregiver support      Co-evaluation               AM-PAC PT "6 Clicks" Mobility  Outcome Measure Help needed turning from your back to your side while in a flat bed without using bedrails?: None Help needed moving from lying on your back to sitting on the side of a flat bed without using bedrails?: A Little Help needed moving to and from a bed to a chair (including a wheelchair)?: A Little Help needed standing up from a chair using your arms (e.g., wheelchair or bedside chair)?: A Little Help needed to walk in hospital room?: A Little Help needed climbing 3-5 steps with a railing? : A Lot 6 Click Score: 18    End of Session Equipment Utilized During Treatment: Oxygen;Gait belt Activity Tolerance: Patient limited by fatigue Patient left: in chair;with chair alarm set;with call bell/phone within reach Nurse Communication: Mobility status(pt had BM) PT Visit Diagnosis: Muscle weakness (generalized) (M62.81);Other abnormalities of gait and mobility (R26.89)    Time: 1700-1734 PT Time Calculation (min) (ACUTE ONLY): 34 min   Charges:   PT Evaluation $PT Eval Low  Complexity: 1 Low PT Treatments $Gait Training: 8-22 mins        [name], PT Acute Rehab Services Pager [phone] [facility] [phone] [facility] [phone]   [name] 02/18/2019, 5:51 PM

## 2019-02-19 ENCOUNTER — Inpatient Hospital Stay (HOSPITAL_COMMUNITY): Payer: Medicare Other

## 2019-02-19 LAB — CBC WITH DIFFERENTIAL/PLATELET
Abs Immature Granulocytes: 0.1 10*3/uL — ABNORMAL HIGH (ref 0.00–0.07)
Basophils Absolute: 0 10*3/uL (ref 0.0–0.1)
Basophils Relative: 0 %
Eosinophils Absolute: 0 10*3/uL (ref 0.0–0.5)
Eosinophils Relative: 0 %
HCT: 33.7 % — ABNORMAL LOW (ref 36.0–46.0)
Hemoglobin: 9.2 g/dL — ABNORMAL LOW (ref 12.0–15.0)
Immature Granulocytes: 1 %
Lymphocytes Relative: 4 %
Lymphs Abs: 0.6 10*3/uL — ABNORMAL LOW (ref 0.7–4.0)
MCH: 20.5 pg — ABNORMAL LOW (ref 26.0–34.0)
MCHC: 27.3 g/dL — ABNORMAL LOW (ref 30.0–36.0)
MCV: 75.2 fL — ABNORMAL LOW (ref 80.0–100.0)
Monocytes Absolute: 0.6 10*3/uL (ref 0.1–1.0)
Monocytes Relative: 4 %
Neutro Abs: 14.4 10*3/uL — ABNORMAL HIGH (ref 1.7–7.7)
Neutrophils Relative %: 91 %
Platelets: 263 10*3/uL (ref 150–400)
RBC: 4.48 MIL/uL (ref 3.87–5.11)
RDW: 24.9 % — ABNORMAL HIGH (ref 11.5–15.5)
WBC: 15.8 10*3/uL — ABNORMAL HIGH (ref 4.0–10.5)
nRBC: 0.6 % — ABNORMAL HIGH (ref 0.0–0.2)

## 2019-02-19 LAB — BASIC METABOLIC PANEL
Anion gap: 6 (ref 5–15)
BUN: 19 mg/dL (ref 8–23)
CO2: 23 mmol/L (ref 22–32)
Calcium: 8.6 mg/dL — ABNORMAL LOW (ref 8.9–10.3)
Chloride: 110 mmol/L (ref 98–111)
Creatinine, Ser: 0.87 mg/dL (ref 0.44–1.00)
GFR calc Af Amer: >60 mL/min (ref >60)
GFR calc non Af Amer: >60 mL/min (ref >60)
Glucose, Bld: 253 mg/dL — ABNORMAL HIGH (ref 70–99)
Potassium: 3.7 mmol/L (ref 3.5–5.1)
Sodium: 139 mmol/L (ref 135–145)

## 2019-02-19 LAB — GLUCOSE, CAPILLARY
Glucose-Capillary: 306 mg/dL — ABNORMAL HIGH (ref 70–99)
Glucose-Capillary: 207 mg/dL — ABNORMAL HIGH (ref 70–99)
Glucose-Capillary: 306 mg/dL — ABNORMAL HIGH (ref 70–99)
Glucose-Capillary: 344 mg/dL — ABNORMAL HIGH (ref 70–99)

## 2019-02-19 NOTE — Progress Notes (Signed)
Pt got up to [facility] with assist. With activity pt became very SOB w/ oxygen levels dropping to 78% on 5L. Oxygen turned up to 13L and pt recovered to 92% after 10 minutes of deep breathing. Pt returned to bed. Will continue to monitor.  Clyde Canterbury, RN

## 2019-02-19 NOTE — TOC Initial Note (Addendum)
Transition of Care [facility]) - Initial/Assessment Note    Patient Details  Name: [name] MRN: [id number] Date of Birth: [birth year]  Transition of Care [facility]) CM/SW Contact:    Vinie Sill, [place] Phone Number: 02/19/2019, 5:41 PM  Clinical Narrative:                  CSW visit with the patient at bedside. Patient confirmed she was from [facility] and anticipates returning there once she is medically ready for discharge.   CSW sent SNF referral to [facility] and contacted them to confirm placement.- waiting on response.  Patient will need covid test prior to d/c to SNF.   Thurmond Butts, MSW, LCSWA Clinical Social Worker    Barriers to Discharge: Continued Medical Work up   Patient Goals and CMS Choice        Expected Discharge Plan and Services   In-house Referral: Clinical Social Work     Living arrangements for the past 2 months: Single Family Home                                      Prior Living Arrangements/Services Living arrangements for the past 2 months: Single Family Home Lives with:: Self                   Activities of Daily Living Home Assistive Devices/Equipment: Environmental consultant (specify type), Oxygen ADL Screening (condition at time of admission) Patient's cognitive ability adequate to safely complete daily activities?: Yes Is the patient deaf or have difficulty hearing?: No Does the patient have difficulty seeing, even when wearing glasses/contacts?: No Does the patient have difficulty concentrating, remembering, or making decisions?: No Patient able to express need for assistance with ADLs?: Yes Does the patient have difficulty dressing or bathing?: Yes Independently performs ADLs?: No Communication: Independent Dressing (OT): Needs assistance Is this a change from baseline?: Pre-admission baseline Grooming: Needs assistance Is this a change from baseline?: Pre-admission baseline Feeding: Independent Bathing: Needs  assistance Is this a change from baseline?: Pre-admission baseline Toileting: Needs assistance Is this a change from baseline?: Pre-admission baseline In/Out Bed: Needs assistance Is this a change from baseline?: Pre-admission baseline Walks in Home: Needs assistance Is this a change from baseline?: Pre-admission baseline Does the patient have difficulty walking or climbing stairs?: Yes Weakness of Legs: Both Weakness of Arms/Hands: None  Permission Sought/Granted                  Emotional Assessment Appearance:: Appears stated age Attitude/Demeanor/Rapport: Engaged Affect (typically observed): Accepting, Appropriate Orientation: : Oriented to Self, Oriented to Place, Oriented to  Time, Oriented to Situation Alcohol / Substance Use: Not Applicable Psych Involvement: No (comment)  Admission diagnosis:  Hypoxia [R09.02] HCAP (healthcare-associated pneumonia) [J18.9] Acute on chronic respiratory failure ([place]) [J96.20] Patient Active Problem List   Diagnosis Date Noted  . Hypoxia   . Acute on chronic respiratory failure ([place]) 02/16/2019  . Adult failure to thrive   . Acute respiratory failure with hypoxia and hypercapnia (HCC)   . On corticosteroid therapy 12/08/2018  . Current chronic use of systemic steroids 12/08/2018  . Complex care coordination 11/13/2018  . Palliative care by specialist   . DNR (do not resuscitate) discussion   . COPD exacerbation ([place]) 11/02/2018  . Hypokalemia 11/02/2018  . Acute on chronic respiratory failure with hypoxia ([place]) 08/26/2018  . Hyperkalemia 08/26/2018  . Psoriasis 07/01/2017  .  Depression 06/24/2016  . HCAP (healthcare-associated pneumonia)   . Pulmonary hypertension ([place])   . Physical deconditioning 11/15/2015  . GERD (gastroesophageal reflux disease) 11/01/2015  . 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 Diabetes ([place]) 01/23/2015  . Fatigue 01/20/2015  . Obesity, unspecified 03/15/2013  . Acute on chronic diastolic CHF  (congestive heart failure) ([place]) 03/14/2013  . Chronic respiratory failure ([place]) 06/30/2012  . COPD (chronic obstructive pulmonary disease) ([place])   . Tobacco abuse   . Pulmonary nodule 03/22/2010  . 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 Bronchiectasis ([place]) 03/22/2010  . Hyperlipidemia 06/13/2008  . Anxiety 02/20/2007  . MITRAL VALVE PROLAPSE 02/20/2007  . CERVICAL CANCER, HX OF 02/20/2007   PCP:  Binnie Rail, MD Pharmacy:  No Pharmacies Listed    Social Determinants of Health (SDOH) Interventions    Readmission Risk Interventions Readmission Risk Prevention Plan 11/06/2018 11/05/2018  Transportation Screening Complete Complete  PCP or Specialist Appt within 3-5 Days Complete -  HRI or Hand Complete Complete  Social Work Consult for [place] Planning/Counseling Complete Complete  Palliative Care Screening Complete Complete  Medication Review Press photographer) Complete Complete  Some recent data might be hidden

## 2019-02-19 NOTE — Progress Notes (Addendum)
PROGRESS NOTE    [name]  [id number] DOB: [birth year] DOA: 02/16/2019 PCP: Binnie Rail, MD  Brief Narrative:74 year old female past medical history significant for COPD on 3- 6L of oxygen according to the Dr.Yao,diastolic CHF, obesity, mitral valve prolapse, hypertension, hyperlipidemia, mediastinal mass and arrhythmias. Apparently, patient presented with worsening shortness of breath and wheezing. ER provider as the hospitalist team to admit patient. Patient is in significant respiratory distress and, therefore, cannot give any history. Pertinent labs revealed potassium of 2.6, cardiac BNP of 61.2, UA done revealed moderate hemoglobin and leukocyte with specific gravity of 1.013. Leukocytosis of 12.7 is noted. Chest x-ray revealed "Mild right basilar and moderate severity left basilar atelectasis and/or infiltrate. Focal opacification within the lateral aspect of the left lung base which is more prominent on the current study and may represent a more focal area of consolidation. An area of loculated pleural fluid cannot be excluded.Stable cardiomegaly".CT angio chest revealed "Moderate severity left lower lobe atelectasis and/or infiltrate with mild left-sided volume loss. Multiple stable subcentimeter right upper lobe and right middle lobe noncalcified lung nodules".  ED Course:On presentation to the hospital, temperature was 99 primary, heart rate of 102, respiratory rate of 20 to 30/min, blood pressure of 89-140/55-81 mmHg and O2 sat of 86 to 100%. Patient has been on various oxygen supplementation, from 3 L to 6 L/min via nasal cannula to high flow oxygen. Patient has had intermittent episodes of severe respiratory distress as documented above. Discussed option of ICU involvement with the emergency room provider, Dr. Darl Householder.   Assessment & Plan:   Active Problems:   HCAP (healthcare-associated pneumonia)   Acute on chronic respiratory failure ([place])   Hypoxia  #1  acute hypoxic respiratory failure secondary to COPD exacerbation/HCAP-patient is slightly better today compared to yesterday.  She was able to speak to me today.  However her saturation dropped to 78% on 5 L.  I will continue Solu-Medrol and nebulizer treatments.  She was seen by PT and recommend SNF.  She lives alone and not able to care for herself and has dyspnea on exertion with any activities.  Consulted toc discharge needs.  She is medically not stable to be discharged yet.   She is intermittently tachycardic and tachypneic. CT of the chestleft lower lobe atelectasis versus infiltrates with mild left-sided volume loss, multiple stable subcentimeter right upper lobe right middle lobe noncalcified lung nodules. Restart home breathing treatments. Continue cefepime BiPAP as needed. Chest x-ray today PT consult out of bed ambulate Likely will need SNF on discharge. Consulted palliative care #2 diastolic heart failure-patient takes Diamox to 50 mg twice a day with Lasix 40 mg daily at home.  Restarted.  #3 anxiety Xanax restarted.  #4 type 2 diabetes increase Lantus to 35 units and added NovoLog 6 units 3 times a day appreciate diabetic coordinator input.  Her Lantus dose was just increased 02/18/2019 I will continue the same for now. CBG (last 3)  Recent Labs    02/18/19 2140 02/19/19 0618 02/19/19 1122  GLUCAP 280* 306* 207*   #5 depression continue Paxil.   Pressure Injury 08/30/18 Buttocks Right;Medial Stage II -  Partial thickness loss of dermis presenting as a shallow open ulcer with a red, pink wound bed without slough. (Active)  08/30/18 2134  Location: Buttocks  Location Orientation: Right;Medial  Staging: Stage II -  Partial thickness loss of dermis presenting as a shallow open ulcer with a red, pink wound bed without slough.  Wound Description (Comments):  Present on Admission:      Estimated body mass index is 35.31 kg/m as calculated from the following:   Height  as of this encounter: 5\' 4"  (1.626 m).   Weight as of this encounter: 93.3 kg.  DVT prophylaxis: lovenox Code Status: Full code  family Communication: Called son without any response Disposition Plan: Patient came from [facility] she likely will need to go back to SNF facility.  Barriers to discharge is patient still dependent on and hypoxic on 5 L of oxygen still needs IV steroids.   Consultants:   None   Procedures: None  Antimicrobials: Cefepime  Subjective: She is resting in bed awake and alert gets hypoxic and short of breath on exertion.  Objective: Vitals:   02/19/19 0620 02/19/19 0745 02/19/19 0910 02/19/19 1118  BP: 110/60 110/74  109/76  Pulse: 79 86 82 80  Resp: 18 18 15  (!) 22  Temp: 97.7 F (36.5 C) 97.9 F (36.6 C)  98.4 F (36.9 C)  TempSrc: Oral Oral  Oral  SpO2: 97% 93% 100% 94%  Weight:      Height:        Intake/Output Summary (Last 24 hours) at 02/19/2019 1336 Last data filed at 02/19/2019 1100 Gross per 24 hour  Intake 440 ml  Output 1500 ml  Net -1060 ml   Filed Weights   02/16/19 1800 02/17/19 0225 02/18/19 0527  Weight: 94.9 kg 92.2 kg 93.3 kg    Examination:  General exam: Appears calm and comfortable  Respiratory system: Wheezing though decreased to auscultation. Respiratory effort normal. Cardiovascular system: S1 & S2 heard, RRR. No JVD, murmurs, rubs, gallops or clicks. No pedal edema. Gastrointestinal system: Abdomen is nondistended, soft and nontender. No organomegaly or masses felt. Normal bowel sounds heard. Central nervous system: Alert and oriented. No focal neurological deficits. Extremities: Symmetric 5 x 5 power. Skin: No rashes, lesions or ulcers Psychiatry: Judgement and insight appear normal. Mood & affect appropriate.     Data Reviewed: I have personally reviewed following labs and imaging studies  CBC: Recent Labs  Lab 02/16/19 1746 02/16/19 1807 02/16/19 2301 02/17/19 0006 02/17/19 0056  02/17/19 0320 02/19/19 0256  WBC 12.7*  --   --   --  10.6* 9.4 15.8*  NEUTROABS 10.4*  --   --   --   --   --  14.4*  HGB 11.4*   < > 11.6* 11.9* 11.1* 9.8* 9.2*  HCT 43.1   < > 34.0* 35.0* 42.5 36.9 33.7*  MCV 77.2*  --   --   --  77.3* 75.6* 75.2*  PLT PLATELET CLUMPS NOTED ON SMEAR, COUNT APPEARS ADEQUATE  --   --   --  285 276 263   < > = values in this interval not displayed.   Basic Metabolic Panel: Recent Labs  Lab 02/16/19 1746 02/16/19 1746 02/16/19 1807 02/16/19 1807 02/16/19 2301 02/17/19 0006 02/17/19 0056 02/17/19 0320 02/17/19 0650 02/19/19 0256  NA 141   < > 141  141   < > 141 142 140  --  140 139  K 2.6*   < > 2.6*  2.7*   < > 3.2* 3.2* 3.4*  --  3.8 3.7  CL 104  --  104  --   --   --  111  --  112* 110  CO2 24  --   --   --   --   --  21*  --  19* 23  GLUCOSE 190*  --  187*  --   --   --  157*  --  243* 253*  BUN 15  --  15  --   --   --  12  --  13 19  CREATININE 1.11*   < > 1.00  --   --   --  0.80 0.81 0.79 0.87  CALCIUM 8.8*  --   --   --   --   --  8.3*  --  8.2* 8.6*  MG  --   --   --   --   --   --   --  1.7  --   --   PHOS  --   --   --   --   --   --   --  2.1*  --   --    < > = values in this interval not displayed.   GFR: Estimated Creatinine Clearance: 65.6 mL/min (by C-G formula based on SCr of 0.87 mg/dL). Liver Function Tests: Recent Labs  Lab 02/16/19 1746  AST 18  ALT 24  ALKPHOS 92  BILITOT 0.5  PROT 6.4*  ALBUMIN 3.4*   No results for input(s): LIPASE, AMYLASE in the last 168 hours. No results for input(s): AMMONIA in the last 168 hours. Coagulation Profile: No results for input(s): INR, PROTIME in the last 168 hours. Cardiac Enzymes: No results for input(s): CKTOTAL, CKMB, CKMBINDEX, TROPONINI in the last 168 hours. BNP (last 3 results) No results for input(s): PROBNP in the last 8760 hours. HbA1C: Recent Labs    02/17/19 0320  HGBA1C 9.1*   CBG: Recent Labs  Lab 02/18/19 1104 02/18/19 1620 02/18/19 2140  02/19/19 0618 02/19/19 1122  GLUCAP 319* 212* 280* 306* 207*   Lipid Profile: No results for input(s): CHOL, HDL, LDLCALC, TRIG, CHOLHDL, LDLDIRECT in the last 72 hours. Thyroid Function Tests: No results for input(s): TSH, T4TOTAL, FREET4, T3FREE, THYROIDAB in the last 72 hours. Anemia Panel: No results for input(s): VITAMINB12, FOLATE, FERRITIN, TIBC, IRON, RETICCTPCT in the last 72 hours. Sepsis Labs: Recent Labs  Lab 02/16/19 1746 02/17/19 0320  LATICACIDVEN 2.1* 0.9    Recent Results (from the past 240 hour(s))  Blood Culture (routine x 2)     Status: None (Preliminary result)   Collection Time: 02/16/19  5:45 PM   Specimen: BLOOD  Result Value Ref Range Status   Specimen Description BLOOD RIGHT ANTECUBITAL  Final   Special Requests   Final    BOTTLES DRAWN AEROBIC ONLY Blood Culture results may not be optimal due to an inadequate volume of blood received in culture bottles   Culture   Final    NO GROWTH 3 DAYS Performed at [facility], [place] [address], [place], [place] [address]    Report Status PENDING  Incomplete  Blood Culture (routine x 2)     Status: None (Preliminary result)   Collection Time: 02/16/19  5:45 PM   Specimen: BLOOD  Result Value Ref Range Status   Specimen Description BLOOD LEFT ANTECUBITAL  Final   Special Requests   Final    BOTTLES DRAWN AEROBIC AND ANAEROBIC Blood Culture adequate volume   Culture   Final    NO GROWTH 3 DAYS Performed at [facility], [place] [address] [place], [place] [address]    Report Status PENDING  Incomplete  Urine culture     Status: Abnormal   Collection Time: 02/16/19  7:34 PM  Specimen: In/Out Cath Urine  Result Value Ref Range Status   Specimen Description IN/OUT CATH URINE  Final   Special Requests   Final    NONE Performed at [facility], [address], [place], [place] [address]    Culture MULTIPLE SPECIES PRESENT, SUGGEST RECOLLECTION (A)  Final   Report Status 02/18/2019 FINAL   Final  Respiratory Panel by RT PCR (Flu A&B, Covid) - Nasopharyngeal Swab     Status: None   Collection Time: 02/16/19  8:34 PM   Specimen: Nasopharyngeal Swab  Result Value Ref Range Status   SARS Coronavirus 2 by RT PCR NEGATIVE NEGATIVE Final    Comment: (NOTE) SARS-CoV-2 target nucleic acids are NOT DETECTED. The SARS-CoV-2 RNA is generally detectable in upper respiratoy specimens during the acute phase of infection. The lowest concentration of SARS-CoV-2 viral copies this assay can detect is 131 copies/mL. A negative result does not preclude SARS-Cov-2 infection and should not be used as the sole basis for treatment or other patient management decisions. A negative result may occur with  improper specimen collection/handling, submission of specimen other than nasopharyngeal swab, presence of viral mutation(s) within the areas targeted by this assay, and inadequate number of viral copies (<131 copies/mL). A negative result must be combined with clinical observations, patient history, and epidemiological information. The expected result is Negative. Fact Sheet for Patients:  PinkCheek.be Fact Sheet for Healthcare Providers:  GravelBags.it This test is not yet ap proved or cleared by the [place] FDA and  has been authorized for detection and/or diagnosis of SARS-CoV-2 by FDA under an Emergency Use Authorization (EUA). This EUA will remain  in effect (meaning this test can be used) for the duration of the COVID-19 declaration under Section 564(b)(1) of the Act, 21 U.S.C. section 360bbb-3(b)(1), unless the authorization is terminated or revoked sooner.    Influenza A by PCR NEGATIVE NEGATIVE Final   Influenza B by PCR NEGATIVE NEGATIVE Final    Comment: (NOTE) The Xpert Xpress SARS-CoV-2/FLU/RSV assay is intended as an aid in  the diagnosis of influenza from Nasopharyngeal swab specimens and  should not be used as a  sole basis for treatment. Nasal washings and  aspirates are unacceptable for Xpert Xpress SARS-CoV-2/FLU/RSV  testing. Fact Sheet for Patients: PinkCheek.be Fact Sheet for Healthcare Providers: GravelBags.it This test is not yet approved or cleared by the [place] FDA and  has been authorized for detection and/or diagnosis of SARS-CoV-2 by  FDA under an Emergency Use Authorization (EUA). This EUA will remain  in effect (meaning this test can be used) for the duration of the  Covid-19 declaration under Section 564(b)(1) of the Act, 21  U.S.C. section 360bbb-3(b)(1), unless the authorization is  terminated or revoked. Performed at [facility], [place] [address], [place], [place] [address]   SARS CORONAVIRUS 2 (TAT 6-24 HRS) Nasopharyngeal Nasopharyngeal Swab     Status: None   Collection Time: 02/17/19 12:50 AM   Specimen: Nasopharyngeal Swab  Result Value Ref Range Status   SARS Coronavirus 2 NEGATIVE NEGATIVE Final    Comment: (NOTE) SARS-CoV-2 target nucleic acids are NOT DETECTED. The SARS-CoV-2 RNA is generally detectable in upper and lower respiratory specimens during the acute phase of infection. Negative results do not preclude SARS-CoV-2 infection, do not rule out co-infections with other pathogens, and should not be used as the sole basis for treatment or other patient management decisions. Negative results must be combined with clinical observations, patient history, and epidemiological  information. The expected result is Negative. Fact Sheet for Patients: SugarRoll.be Fact Sheet for Healthcare Providers: https://www.woods-Tima Curet.com/ This test is not yet approved or cleared by the [place] FDA and  has been authorized for detection and/or diagnosis of SARS-CoV-2 by FDA under an Emergency Use Authorization (EUA). This EUA will remain  in effect (meaning this  test can be used) for the duration of the COVID-19 declaration under Section 56 4(b)(1) of the Act, 21 U.S.C. section 360bbb-3(b)(1), unless the authorization is terminated or revoked sooner. Performed at [facility], [place] [address], [place], [place] [address]   MRSA PCR Screening     Status: None   Collection Time: 02/17/19  3:25 AM   Specimen: Nasopharyngeal Swab  Result Value Ref Range Status   MRSA by PCR NEGATIVE NEGATIVE Final    Comment:        The GeneXpert MRSA Assay (FDA approved for NASAL specimens only), is one component of a comprehensive MRSA colonization surveillance program. It is not intended to diagnose MRSA infection nor to guide or monitor treatment for MRSA infections. Performed at [facility], [place] [address] [place], [place] [address]          Radiology Studies: No results found.      Scheduled Meds: . acetaZOLAMIDE  250 mg Oral BID  . ALPRAZolam  0.25 mg Oral TID  . arformoterol  15 mcg Nebulization BID  . budesonide (PULMICORT) nebulizer solution  0.25 mg Nebulization BID  . enoxaparin (LOVENOX) injection  40 mg Subcutaneous Q24H  . furosemide  40 mg Oral Daily  . insulin aspart  0-5 Units Subcutaneous QHS  . insulin aspart  0-9 Units Subcutaneous TID WC  . insulin aspart  6 Units Subcutaneous TID WC  . insulin glargine  35 Units Subcutaneous QHS  . methylPREDNISolone (SOLU-MEDROL) injection  40 mg Intravenous Q6H  . PARoxetine  40 mg Oral q morning - 10a  . potassium chloride  40 mEq Oral Once   Continuous Infusions: . ceFEPime (MAXIPIME) IV 2 g (02/19/19 1307)     LOS: 3 days     Georgette Shell, MD [facility]  If 7PM-7AM, please contact night-coverage [email or web address] Password [facility] 02/19/2019, 1:36 PM

## 2019-02-20 DIAGNOSIS — Z659 Problem related to unspecified psychosocial circumstances: Secondary | ICD-10-CM

## 2019-02-20 DIAGNOSIS — R5382 Chronic fatigue, unspecified: Secondary | ICD-10-CM

## 2019-02-20 DIAGNOSIS — Z7189 Other specified counseling: Secondary | ICD-10-CM

## 2019-02-20 DIAGNOSIS — M6281 Muscle weakness (generalized): Secondary | ICD-10-CM

## 2019-02-20 DIAGNOSIS — R0902 Hypoxemia: Secondary | ICD-10-CM

## 2019-02-20 LAB — GLUCOSE, CAPILLARY
Glucose-Capillary: 292 mg/dL — ABNORMAL HIGH (ref 70–99)
Glucose-Capillary: 292 mg/dL — ABNORMAL HIGH (ref 70–99)
Glucose-Capillary: 338 mg/dL — ABNORMAL HIGH (ref 70–99)
Glucose-Capillary: 334 mg/dL — ABNORMAL HIGH (ref 70–99)
Glucose-Capillary: 307 mg/dL — ABNORMAL HIGH (ref 70–99)

## 2019-02-20 LAB — SARS CORONAVIRUS 2 (TAT 6-24 HRS): SARS Coronavirus 2: NEGATIVE

## 2019-02-20 MED ORDER — INSULIN GLARGINE 100 UNIT/ML ~~LOC~~ SOLN
40.0000 [IU] | Freq: Every day | SUBCUTANEOUS | Status: DC
  Administered 2019-02-20 – 2019-02-23 (×4): 40 [IU] via SUBCUTANEOUS
  Filled 2019-02-20 (×5): qty 0.4

## 2019-02-20 MED ORDER — METHYLPREDNISOLONE SODIUM SUCC 40 MG IJ SOLR
40.0000 mg | Freq: Two times a day (BID) | INTRAMUSCULAR | Status: DC
  Administered 2019-02-20 – 2019-02-23 (×6): 40 mg via INTRAVENOUS
  Filled 2019-02-20 (×6): qty 1

## 2019-02-20 MED ORDER — INSULIN ASPART 100 UNIT/ML ~~LOC~~ SOLN
9.0000 [IU] | Freq: Three times a day (TID) | SUBCUTANEOUS | Status: DC
  Administered 2019-02-20 – 2019-02-23 (×7): 9 [IU] via SUBCUTANEOUS

## 2019-02-20 NOTE — Consult Note (Signed)
Consultation Note Date: 02/20/2019   Patient Name: [name]  DOB: February 13, 1945  MRN: [id number]  Age / Sex: 74 y.o., female  PCP: Binnie Rail, MD Referring Physician: Georgette Shell, MD  Reason for Consultation: Establishing goals of care  HPI/Patient Profile:  Per hospitalist note --> 74 year old female past medical history significant for COPD on 3- 6L of oxygen according to the Dr.Yao,diastolic CHF, obesity, mitral valve prolapse, hypertension, hyperlipidemia, mediastinal mass and arrhythmias. Apparently, patient presented with worsening shortness of breath and wheezing. ER provider as the hospitalist team to admit patient. Patient is in significant respiratory distress and, therefore, cannot give any history. Pertinent labs revealed potassium of 2.6, cardiac BNP of 61.2, UA done revealed moderate hemoglobin and leukocyte with specific gravity of 1.013. Leukocytosis of 12.7 is noted. Chest x-ray revealed "Mild right basilar and moderate severity left basilar atelectasis and/or infiltrate. Focal opacification within the lateral aspect of the left lung base which is more prominent on the current study and may represent a more focal area of consolidation. An area of loculated pleural fluid cannot be excluded.Stable cardiomegaly".CT angio chest revealed "Moderate severity left lower lobe atelectasis and/or infiltrate with mild left-sided volume loss. Multiple stable subcentimeter right upper lobe and right middle lobe noncalcified lung nodules".  Clinical Assessment and Goals of Care: I have reviewed medical records including EPIC notes, labs and imaging, received report from bedside RN, assessed the patient who was on 6LPM [place]. She was not noted to be in any distress. Able to speak in full sentences.   I met with Koby Cid to further discuss diagnosis prognosis, GOC, EOL wishes,  disposition and options.   I introduced Palliative Medicine as specialized medical care for people living with serious illness. It focuses on providing relief from the symptoms and stress of a serious illness. The goal is to improve quality of life for both the patient and the family.  I asked Clancy to tell me a little bit about herself. She shared that She was born in [place] and moved to [place] when she was [age]. She liked there until 1995 when she decided to move to [place]. 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 She has three sons. She considers herself closest to her son, [name] who just retired from [employer], he lives in [place]. She is Mina Marble though has been unable to attend services for quite sometime.  I asked Haiden how she was doing since being hospitalized. She said that her breathing feels better. I asked her what she hopes to accomplish in the oncoming days. She said that she would like to be strong enough to get back home. I asked her more about her living situation. She said that recently she was at [facility] where she felt frustrated by their "lack of care" when she felt she could not breath. Offered emotional support through therapeutic listening. She later went on to tell me that prior to this she was living at home  with a care aid a few times a week through a local grant program sponsored for patients 65 years and older. She likes her caregiver at the present and feels that she is quite helpful.   We talked about if she were to go home what that would look like. Inclusive of the need for 24/7 care giving. She said that this is not something that she could afford given that she lives on a fixed income. She stated that there are very few people that she would ever feel comfortable being in her home for 24 hours a day. I told her that alternatively as she had done prior she could try to improve her physical  function at rehabilitation with the goal of getting home.   I shared with Breland that she is appropriate for hospice given the severity of her underlying lung disease. She said that she is having a hard time realizing that she is coming to the end of her life. She states, "I am not ready." We talked about "readiness" and the lack thereof. Discussed things that bring [name] joy. She said that her main goal would be to spend more time wither her son [name] and her eight grandchildren. She mentioned the possibility of relocating to [place] to live with [name]. I asked if I could call him to talk more about safety plans moving forward should she get back home. She said that she would first like to talk to [place] about this.  We discussed code status. She would not want extraordinary means of life support. She would not wish to have CPR or intubation.   Discussed with patient the importance of continued conversation with family and their  medical providers regarding overall plan of care and treatment options, ensuring decisions are within the context of the patients values and GOCs.  Will see Zanyla tomorrow to discuss MOST form and follow up on her conversation with her son, [name].   Decision Maker: Patient is able to make decisions for herself though she stated that f she were debilitated  SUMMARY OF RECOMMENDATIONS   DNR/DNI Will complete MOST tomorrow  Code Status/Advance Care Planning:  DNR  Symptom Management:  Muscular Weakness:  - PT  - OT  - Mobilize during the day Acute hypoxic respiratory failure secondary to COPD exacerbation: HCAP:  - Per primary  - Supplemental O2  - Meets hospice criteria presently though she would wish to get stronger Fatigue:  -Pace yourself  -Plan your day  -Include naps and breaks  -Fuel the body  -Consider complementary therapies Social:  - Lack of social support locally  - [place] -->  8 hours of CNA weekly. Can have hospice without any conflict  should it be decided upon though she would need assistance 24/7 which she is unable to afford on a fixed income  - Plan to attempt calling her son [name] who lives in [place] once patient consents, ideally would prefer to have a family meeting Spiritual:  - Chaplain consult  - Prayer/medication  - Guided meditation   Palliative Prophylaxis:   Aspiration, Bowel Regimen, Delirium Protocol, Eye Care, Frequent Pain Assessment, Oral Care, Palliative Wound Care and Turn Reposition  Additional Recommendations (Limitations, Scope, Preferences):  Full Scope Treatment  Psycho-social/Spiritual:   Desire for further Chaplaincy support:yes  Additional Recommendations: Caregiving  Support/Resources and Education on Hospice  Prognosis:   < 6 months  Discharge Planning: [facility] for rehab with Palliative care service follow-up     Primary Diagnoses:  Present on Admission: . Acute on chronic respiratory failure ([place])  I have reviewed the medical record, interviewed the patient and family, and examined the patient. The following aspects are pertinent.  Past Medical History:  Diagnosis Date  . Arrhythmia   . Bronchiectasis march 2012   On CT chest. RUL. Mild  . CHF (congestive heart failure) ([place])   . Chicken pox   . Chronic diastolic heart failure ([place])   . Chronic respiratory failure ([place])    Followed in Pulmonary clinic/ [facility]/ Ramaswamy  - 06/30/2012 desat to 86%  RA walking 50 ft, recovered to 90% at rest - 06/30/2012  Walked 1lpm x 3 laps @ 185 ft each stopped due to  Sob, no desat  rec 02 2lpm with activity and sleeping, ok at rest   . COPD (chronic obstructive pulmonary disease) ([place])     FEV-1 in 2008 was 63% with a diffusion capacity of 33%.   [place] Kitchen Dyspnea   . Generalized anxiety disorder   . History of cervical cancer 1982  . Hyperlipidemia   . Hypertension   . Leg swelling    Venous doppler right 07/21/12 >>Neg    . 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 Medical history non-contributory   . Medical non-compliance   . MITRAL VALVE PROLAPSE   . Obesity, unspecified   . Pulmonary nodule 03/2010   45m RUL and RLL 1st seen march 2012 CT, ?progression 12/2012 CT  . Seasonal allergies   . Tobacco abuse     Smokes one pack a day since age [age]   Social History   Socioeconomic History  . Marital status: Divorced    Spouse name: Not on file  . Number of children: 0  . Years of education: Not on file  . Highest education level: Not on file  Occupational History  . Occupation: retired  Tobacco Use  . Smoking status: Current Every Day Smoker    Packs/day: 0.25    Years: 50.00    Pack years: 12.50    Types: Cigarettes  . Smokeless tobacco: Never Used  . Tobacco comment: 3-4 cigaretters per day 11/20/2017   Substance and Sexual Activity  . Alcohol use: Yes    Alcohol/week: 0.0 standard drinks    Comment: occasional  . Drug use: No  . Sexual activity: Not Currently  Other Topics Concern  . Not on file  Social History Narrative   Married but recently separated from husband   Social Determinants of Health   Financial Resource Strain: Low Risk   . Difficulty of Paying Living Expenses: Not very hard  Food Insecurity: No Food Insecurity  . Worried About RCharity fundraiserin the Last Year: Never true  . Ran Out of Food in the Last Year: Never true  Transportation Needs: Unmet Transportation Needs  . Lack of Transportation (Medical): Yes  . Lack of Transportation (Non-Medical): No  Physical Activity: Inactive  . Days of Exercise per Week: 0 days  . Minutes of Exercise per Session: 0 min  Stress: Stress Concern Present  . Feeling of Stress : To some extent  Social Connections: Unknown  . Frequency of Communication with Friends and Family: Never  . Frequency of Social Gatherings with Friends and Family: Never  . Attends Religious Services: Not on file  . Active Member of Clubs or  Organizations: Not on file  . Attends CArchivistMeetings: Not on file  .  Marital Status: Divorced   Family History  Problem Relation Age of Onset  . Other Father        MVA  . Esophageal cancer Mother    Scheduled Meds: . acetaZOLAMIDE  250 mg Oral BID  . ALPRAZolam  0.25 mg Oral TID  . arformoterol  15 mcg Nebulization BID  . budesonide (PULMICORT) nebulizer solution  0.25 mg Nebulization BID  . enoxaparin (LOVENOX) injection  40 mg Subcutaneous Q24H  . furosemide  40 mg Oral Daily  . insulin aspart  0-5 Units Subcutaneous QHS  . insulin aspart  0-9 Units Subcutaneous TID WC  . insulin aspart  6 Units Subcutaneous TID WC  . insulin glargine  35 Units Subcutaneous QHS  . methylPREDNISolone (SOLU-MEDROL) injection  40 mg Intravenous Q6H  . PARoxetine  40 mg Oral q morning - 10a  . potassium chloride  40 mEq Oral Once   Continuous Infusions: . ceFEPime (MAXIPIME) IV 2 g (02/20/19 0554)   PRN Meds:.albuterol, ipratropium-albuterol Medications Prior to Admission:  Prior to Admission medications   Medication Sig Start Date End Date Taking? Authorizing Provider  acetaZOLAMIDE (DIAMOX) 250 MG tablet Take 1 tablet (250 mg total) by mouth 2 (two) times daily. 09/05/18  Yes Nita Sells, MD  albuterol (PROVENTIL HFA;VENTOLIN HFA) 108 (90 Base) MCG/ACT inhaler Inhale 2 puffs into the lungs every 6 (six) hours as needed for wheezing or shortness of breath. 12/24/17  Yes Brand Males, MD  ALPRAZolam Duanne Moron) 0.25 MG tablet Take 1 tablet (0.25 mg total) by mouth 3 (three) times daily as needed for anxiety. Patient taking differently: Take 0.25 mg by mouth 3 (three) times daily.  02/02/19  Yes Swayze, Ava, DO  arformoterol (BROVANA) 15 MCG/2ML NEBU Take 2 mLs (15 mcg total) by nebulization 2 (two) times daily. 02/02/19  Yes Swayze, Ava, DO  budesonide (PULMICORT) 0.25 MG/2ML nebulizer solution Take 2 mLs (0.25 mg total) by nebulization 2 (two) times daily. 02/02/19  Yes  Swayze, Ava, DO  fluticasone (FLONASE) 50 MCG/ACT nasal spray SPRAY 2 SPRAYS INTO EACH NOSTRIL EVERY DAY Patient taking differently: Place 2 sprays into both nostrils daily.  01/11/19  Yes Brand Males, MD  Fluticasone-Umeclidin-Vilant (TRELEGY ELLIPTA) 100-62.5-25 MCG/INH AEPB Inhale 1 puff into the lungs daily. 07/16/18  Yes Brand Males, MD  furosemide (LASIX) 40 MG tablet Take 40 mg by mouth daily.    Yes [provider]  guaiFENesin (MUCINEX) 600 MG 12 hr tablet Take 1 tablet (600 mg total) by mouth 2 (two) times daily. 01/04/19  Yes Martyn Ehrich, NP  insulin aspart (NOVOLOG) 100 UNIT/ML injection Inject 3 Units into the skin 3 (three) times daily with meals. 02/02/19  Yes Swayze, Ava, DO  insulin glargine (LANTUS) 100 UNIT/ML injection Inject 25 Units into the skin at bedtime.   Yes [provider]  iron polysaccharides (NIFEREX) 150 MG capsule Take 1 capsule (150 mg total) by mouth daily. 02/03/19  Yes Swayze, Ava, DO  pantoprazole (PROTONIX) 40 MG tablet Take 1 tablet (40 mg total) by mouth daily. 01/26/18  Yes Burns, Claudina Lick, MD  PARoxetine (PAXIL) 40 MG tablet Take 1 tablet (40 mg total) by mouth every morning. Patient taking differently: Take 40 mg by mouth daily.  02/03/19  Yes Swayze, Ava, DO  predniSONE (DELTASONE) 20 MG tablet Take 10 mg by mouth daily with breakfast.   Yes [provider]  umeclidinium bromide (INCRUSE ELLIPTA) 62.5 MCG/INH AEPB Inhale 1 puff into the lungs daily. 02/03/19  Yes Swayze,  Ava, DO  acetaminophen (TYLENOL) 325 MG tablet Take 2 tablets (650 mg total) by mouth every 6 (six) hours as needed for mild pain or fever. Patient not taking: Reported on 02/17/2019 02/02/19   Swayze, Ava, DO  GLUCOCOM LANCETS 33G MISC Use with Test Strips to take blood sugars 07/20/15   Burns, Claudina Lick, MD  insulin glargine (LANTUS) 100 UNIT/ML injection Inject 0.35 mLs (35 Units total) into the skin at bedtime for 5 days, THEN 0.3 mLs (30 Units total)  at bedtime for 5 days, THEN 0.25 mLs (25 Units total) at bedtime. Patient not taking: Reported on 02/16/2019 02/02/19 05/22/19  Swayze, Ava, DO  Insulin Pen Needle (BD PEN NEEDLE NANO U/F) 32G X 4 MM MISC USE TO ADMINISTER LANTUS INSULIN 05/08/16   Binnie Rail, MD   Allergies  Allergen Reactions  . Ambien [Zolpidem Tartrate] Other (See Comments)    Causes nightmares   Review of Systems  Constitutional: Positive for activity change.  All other systems reviewed and are negative.  Physical Exam Vitals and nursing note reviewed.  HENT:     Head: Normocephalic.  Eyes:     Pupils: Pupils are equal, round, and reactive to light.  Cardiovascular:     Rate and Rhythm: Normal rate and regular rhythm.  Pulmonary:     Effort: Pulmonary effort is normal.     Breath sounds: Examination of the right-upper field reveals rhonchi. Examination of the left-upper field reveals rhonchi. Examination of the right-middle field reveals rhonchi. Examination of the left-middle field reveals rhonchi. Examination of the right-lower field reveals rhonchi. Examination of the left-lower field reveals rhonchi. Rhonchi present.  Abdominal:     Palpations: Abdomen is soft.  Musculoskeletal:        General: Normal range of motion.     Cervical back: Normal range of motion.  Skin:    General: Skin is warm and dry.     Capillary Refill: Capillary refill takes less than 2 seconds.  Neurological:     Mental Status: She is alert and oriented to person, place, and time.    Vital Signs: BP 112/73 (BP Location: Right Arm)   Pulse 73   Temp 97.7 F (36.5 C) (Oral)   Resp 20   Ht '5\' 4"'$  (1.626 m)   Wt 92.6 kg   SpO2 98%   BMI 35.05 kg/m  Pain Scale: 0-10   Pain Score: 0-No pain  SpO2: SpO2: 98 % O2 Device:SpO2: 98 % O2 Flow Rate: .O2 Flow Rate (L/min): 6 L/min  IO: Intake/output summary:   Intake/Output Summary (Last 24 hours) at 02/20/2019 1016 Last data filed at 02/20/2019 0606 Gross per 24 hour  Intake 700  ml  Output 1500 ml  Net -800 ml   LBM: Last BM Date: 02/19/19 Baseline Weight: Weight: 94.9 kg Most recent weight: Weight: 92.6 kg     Palliative Assessment/Data: 50%   Time In: 0920 Time Out: 1030 Time Total: 70 Greater than 50%  of this time was spent counseling and coordinating care related to the above assessment and plan.  Signed by: Rosezella Rumpf, NP   Please contact Palliative Medicine Team phone at [phone] for questions and concerns.  For individual provider: See Shea Evans

## 2019-02-20 NOTE — Progress Notes (Signed)
PROGRESS NOTE    [name]  [id number] DOB: 12/15/1945 DOA: 02/16/2019 PCP: Binnie Rail, MD   Brief Narrative: 74 year old female past medical history significant for COPD on 3- 6L of oxygen according to the Dr.Yao,diastolic CHF, obesity, mitral valve prolapse, hypertension, hyperlipidemia, mediastinal mass and arrhythmias. Apparently, patient presented with worsening shortness of breath and wheezing. ER provider as the hospitalist team to admit patient. Patient is in significant respiratory distress and, therefore, cannot give any history. Pertinent labs revealed potassium of 2.6, cardiac BNP of 61.2, UA done revealed moderate hemoglobin and leukocyte with specific gravity of 1.013. Leukocytosis of 12.7 is noted. Chest x-ray revealed "Mild right basilar and moderate severity left basilar atelectasis and/or infiltrate. Focal opacification within the lateral aspect of the left lung base which is more prominent on the current study and may represent a more focal area of consolidation. An area of loculated pleural fluid cannot be excluded.Stable cardiomegaly".CT angio chest revealed "Moderate severity left lower lobe atelectasis and/or infiltrate with mild left-sided volume loss. Multiple stable subcentimeter right upper lobe and right middle lobe noncalcified lung nodules".  ED Course:On presentation to the hospital, temperature was 99 primary, heart rate of 102, respiratory rate of 20 to 30/min, blood pressure of 89-140/55-81 mmHg and O2 sat of 86 to 100%. Patient has been on various oxygen supplementation, from 3 L to 6 L/min via nasal cannula to high flow oxygen. Patient has had intermittent episodes of severe respiratory distress as documented above. Discussed option of ICU involvement with the emergency room provider, Dr. Darl Householder.  Assessment & Plan:   Active Problems:   HCAP (healthcare-associated pneumonia)   Acute on chronic respiratory failure ([place])   Hypoxia  Concerned about having social problem   Muscular weakness   Goals of care, counseling/discussion   Hypoxemia  #1 acute hypoxic respiratory failure secondary to COPD exacerbation/HCAP-ED patient is slightly better today compared to yesterday.    She remains on 6 L of oxygen saturating anywhere between 89 to 92%.  She has end-stage COPD.  I have discussed with palliative care appreciate their input.  Patient's CODE STATUS changed to DNR.  Decrease Solu-Medrol to once a day.  Continue nebulizer treatments.  She was seen by PT and recommend SNF.  She lives alone and not able to care for herself and has dyspnea on exertion with any activities.  Consulted toc discharge needs.  She is medically not stable to be discharged yet.   She is intermittently tachycardic and tachypneic. CT of the chestleft lower lobe atelectasis versus infiltrates with mild left-sided volume loss, multiple stable subcentimeter right upper lobe right middle lobe noncalcified lung nodules. Restart home breathing treatments. Continue cefepime BiPAP as needed. Chest x-ray right basilar opacities remain improved.  X-ray from 02/19/2019.  #2 diastolic heart failure-stable continue Lasix and Diamox.  Chest x-ray shows no effusion or heart failure or pulmonary edema.    #3 anxiety Xanax restarted.  #4 type 2 diabetes increase Lantus to 40 units and NovoLog to 9 units 3 times a day.   #5 depression continue Paxil.  Pressure Injury 08/30/18 Buttocks Right;Medial Stage II -  Partial thickness loss of dermis presenting as a shallow open ulcer with a red, pink wound bed without slough. (Active)  08/30/18 2134  Location: Buttocks  Location Orientation: Right;Medial  Staging: Stage II -  Partial thickness loss of dermis presenting as a shallow open ulcer with a red, pink wound bed without slough.  Wound Description (Comments):   Present on Admission:  Estimated body mass index is 35.05 kg/m as calculated from the following:    Height as of this encounter: 5\' 4"  (1.626 m).   Weight as of this encounter: 92.6 kg.  DVT prophylaxis: lovenox Code Status: Full code  family Communication: Called son without any response Disposition Plan: Patient came from [facility] she likely will need to go back to SNF facility.  Barriers to discharge is patient still dependent on and hypoxic on 5 L of oxygen still needs IV steroids.   Will order Covid 02/21/2019.  Consultants:   None   Procedures: None  Antimicrobials: Cefepime Subjective: She is resting in bed she feels her breathing is better she is agreeable to go to skilled nursing facility.  Objective: Vitals:   02/20/19 0621 02/20/19 0844 02/20/19 0847 02/20/19 1128  BP: 102/66 112/73  110/61  Pulse: 77 73    Resp: 16 20    Temp: 97.7 F (36.5 C) 97.7 F (36.5 C)    TempSrc: Oral Oral  Oral  SpO2: 95% 94% 98%   Weight: 92.6 kg     Height:        Intake/Output Summary (Last 24 hours) at 02/20/2019 1224 Last data filed at 02/20/2019 0606 Gross per 24 hour  Intake 700 ml  Output 1100 ml  Net -400 ml   Filed Weights   02/17/19 0225 02/18/19 0527 02/20/19 0621  Weight: 92.2 kg 93.3 kg 92.6 kg    Examination:  General exam: Appears calm and comfortable  Respiratory system: Scattered wheezes to auscultation. Respiratory effort normal. Cardiovascular system: S1 & S2 heard, RRR. No JVD, murmurs, rubs, gallops or clicks. No pedal edema. Gastrointestinal system: Abdomen is nondistended, soft and nontender. No organomegaly or masses felt. Normal bowel sounds heard. Central nervous system: Alert and oriented. No focal neurological deficits. Extremities: Symmetric 5 x 5 power. Skin: No rashes, lesions or ulcers Psychiatry: Judgement and insight appear normal. Mood & affect appropriate.     Data Reviewed: I have personally reviewed following labs and imaging studies  CBC: Recent Labs  Lab 02/16/19 1746 02/16/19 1807 02/16/19 2301  02/17/19 0006 02/17/19 0056 02/17/19 0320 02/19/19 0256  WBC 12.7*  --   --   --  10.6* 9.4 15.8*  NEUTROABS 10.4*  --   --   --   --   --  14.4*  HGB 11.4*   < > 11.6* 11.9* 11.1* 9.8* 9.2*  HCT 43.1   < > 34.0* 35.0* 42.5 36.9 33.7*  MCV 77.2*  --   --   --  77.3* 75.6* 75.2*  PLT PLATELET CLUMPS NOTED ON SMEAR, COUNT APPEARS ADEQUATE  --   --   --  285 276 263   < > = values in this interval not displayed.   Basic Metabolic Panel: Recent Labs  Lab 02/16/19 1746 02/16/19 1746 02/16/19 1807 02/16/19 1807 02/16/19 2301 02/17/19 0006 02/17/19 0056 02/17/19 0320 02/17/19 0650 02/19/19 0256  NA 141   < > 141  141   < > 141 142 140  --  140 139  K 2.6*   < > 2.6*  2.7*   < > 3.2* 3.2* 3.4*  --  3.8 3.7  CL 104  --  104  --   --   --  111  --  112* 110  CO2 24  --   --   --   --   --  21*  --  19* 23  GLUCOSE 190*  --  187*  --   --   --  157*  --  243* 253*  BUN 15  --  15  --   --   --  12  --  13 19  CREATININE 1.11*   < > 1.00  --   --   --  0.80 0.81 0.79 0.87  CALCIUM 8.8*  --   --   --   --   --  8.3*  --  8.2* 8.6*  MG  --   --   --   --   --   --   --  1.7  --   --   PHOS  --   --   --   --   --   --   --  2.1*  --   --    < > = values in this interval not displayed.   GFR: Estimated Creatinine Clearance: 65.4 mL/min (by C-G formula based on SCr of 0.87 mg/dL). Liver Function Tests: Recent Labs  Lab 02/16/19 1746  AST 18  ALT 24  ALKPHOS 92  BILITOT 0.5  PROT 6.4*  ALBUMIN 3.4*   No results for input(s): LIPASE, AMYLASE in the last 168 hours. No results for input(s): AMMONIA in the last 168 hours. Coagulation Profile: No results for input(s): INR, PROTIME in the last 168 hours. Cardiac Enzymes: No results for input(s): CKTOTAL, CKMB, CKMBINDEX, TROPONINI in the last 168 hours. BNP (last 3 results) No results for input(s): PROBNP in the last 8760 hours. HbA1C: No results for input(s): HGBA1C in the last 72 hours. CBG: Recent Labs  Lab 02/19/19 1605  02/19/19 2126 02/20/19 0620 02/20/19 0843 02/20/19 1130  GLUCAP 306* 344* 292* 292* 338*   Lipid Profile: No results for input(s): CHOL, HDL, LDLCALC, TRIG, CHOLHDL, LDLDIRECT in the last 72 hours. Thyroid Function Tests: No results for input(s): TSH, T4TOTAL, FREET4, T3FREE, THYROIDAB in the last 72 hours. Anemia Panel: No results for input(s): VITAMINB12, FOLATE, FERRITIN, TIBC, IRON, RETICCTPCT in the last 72 hours. Sepsis Labs: Recent Labs  Lab 02/16/19 1746 02/17/19 0320  LATICACIDVEN 2.1* 0.9    Recent Results (from the past 240 hour(s))  Blood Culture (routine x 2)     Status: None (Preliminary result)   Collection Time: 02/16/19  5:45 PM   Specimen: BLOOD  Result Value Ref Range Status   Specimen Description BLOOD RIGHT ANTECUBITAL  Final   Special Requests   Final    BOTTLES DRAWN AEROBIC ONLY Blood Culture results may not be optimal due to an inadequate volume of blood received in culture bottles   Culture   Final    NO GROWTH 3 DAYS Performed at [facility], [place] [address] [place], [place] [address]    Report Status PENDING  Incomplete  Blood Culture (routine x 2)     Status: None (Preliminary result)   Collection Time: 02/16/19  5:45 PM   Specimen: BLOOD  Result Value Ref Range Status   Specimen Description BLOOD LEFT ANTECUBITAL  Final   Special Requests   Final    BOTTLES DRAWN AEROBIC AND ANAEROBIC Blood Culture adequate volume   Culture   Final    NO GROWTH 3 DAYS Performed at [facility], [place] [address], [place], [place] [address]    Report Status PENDING  Incomplete  Urine culture     Status: Abnormal   Collection Time: 02/16/19  7:34 PM   Specimen: In/Out Cath Urine  Result  Value Ref Range Status   Specimen Description IN/OUT CATH URINE  Final   Special Requests   Final    NONE Performed at [facility], [place] [address] [place], [place] [address]    Culture MULTIPLE SPECIES PRESENT, SUGGEST RECOLLECTION (A)  Final    Report Status 02/18/2019 FINAL  Final  Respiratory Panel by RT PCR (Flu A&B, Covid) - Nasopharyngeal Swab     Status: None   Collection Time: 02/16/19  8:34 PM   Specimen: Nasopharyngeal Swab  Result Value Ref Range Status   SARS Coronavirus 2 by RT PCR NEGATIVE NEGATIVE Final    Comment: (NOTE) SARS-CoV-2 target nucleic acids are NOT DETECTED. The SARS-CoV-2 RNA is generally detectable in upper respiratoy specimens during the acute phase of infection. The lowest concentration of SARS-CoV-2 viral copies this assay can detect is 131 copies/mL. A negative result does not preclude SARS-Cov-2 infection and should not be used as the sole basis for treatment or other patient management decisions. A negative result may occur with  improper specimen collection/handling, submission of specimen other than nasopharyngeal swab, presence of viral mutation(s) within the areas targeted by this assay, and inadequate number of viral copies (<131 copies/mL). A negative result must be combined with clinical observations, patient history, and epidemiological information. The expected result is Negative. Fact Sheet for Patients:  PinkCheek.be Fact Sheet for Healthcare Providers:  GravelBags.it This test is not yet ap proved or cleared by the [place] FDA and  has been authorized for detection and/or diagnosis of SARS-CoV-2 by FDA under an Emergency Use Authorization (EUA). This EUA will remain  in effect (meaning this test can be used) for the duration of the COVID-19 declaration under Section 564(b)(1) of the Act, 21 U.S.C. section 360bbb-3(b)(1), unless the authorization is terminated or revoked sooner.    Influenza A by PCR NEGATIVE NEGATIVE Final   Influenza B by PCR NEGATIVE NEGATIVE Final    Comment: (NOTE) The Xpert Xpress SARS-CoV-2/FLU/RSV assay is intended as an aid in  the diagnosis of influenza from Nasopharyngeal swab specimens  and  should not be used as a sole basis for treatment. Nasal washings and  aspirates are unacceptable for Xpert Xpress SARS-CoV-2/FLU/RSV  testing. Fact Sheet for Patients: PinkCheek.be Fact Sheet for Healthcare Providers: GravelBags.it This test is not yet approved or cleared by the [place] FDA and  has been authorized for detection and/or diagnosis of SARS-CoV-2 by  FDA under an Emergency Use Authorization (EUA). This EUA will remain  in effect (meaning this test can be used) for the duration of the  Covid-19 declaration under Section 564(b)(1) of the Act, 21  U.S.C. section 360bbb-3(b)(1), unless the authorization is  terminated or revoked. Performed at [facility], [place] [address], [place], [place] [address]   SARS CORONAVIRUS 2 (TAT 6-24 HRS) Nasopharyngeal Nasopharyngeal Swab     Status: None   Collection Time: 02/17/19 12:50 AM   Specimen: Nasopharyngeal Swab  Result Value Ref Range Status   SARS Coronavirus 2 NEGATIVE NEGATIVE Final    Comment: (NOTE) SARS-CoV-2 target nucleic acids are NOT DETECTED. The SARS-CoV-2 RNA is generally detectable in upper and lower respiratory specimens during the acute phase of infection. Negative results do not preclude SARS-CoV-2 infection, do not rule out co-infections with other pathogens, and should not be used as the sole basis for treatment or other patient management decisions. Negative results must be combined with clinical observations, patient history, and epidemiological information. The expected result is Negative.  Fact Sheet for Patients: SugarRoll.be Fact Sheet for Healthcare Providers: https://www.woods-[employer].com/ This test is not yet approved or cleared by the [place] FDA and  has been authorized for detection and/or diagnosis of SARS-CoV-2 by FDA under an Emergency Use Authorization (EUA). This EUA will  remain  in effect (meaning this test can be used) for the duration of the COVID-19 declaration under Section 56 4(b)(1) of the Act, 21 U.S.C. section 360bbb-3(b)(1), unless the authorization is terminated or revoked sooner. Performed at [facility], [place] [address], [place], [place] [address]   MRSA PCR Screening     Status: None   Collection Time: 02/17/19  3:25 AM   Specimen: Nasopharyngeal Swab  Result Value Ref Range Status   MRSA by PCR NEGATIVE NEGATIVE Final    Comment:        The GeneXpert MRSA Assay (FDA approved for NASAL specimens only), is one component of a comprehensive MRSA colonization surveillance program. It is not intended to diagnose MRSA infection nor to guide or monitor treatment for MRSA infections. Performed at [facility], [place] [address], [place],  [address]          Radiology Studies: DG Chest 1 View  Result Date: 02/19/2019 CLINICAL DATA:  Shortness of breath.  History of COPD. EXAM: CHEST  1 VIEW COMPARISON:  February 16, 2019 FINDINGS: No pneumothorax. Stable cardiomegaly. The hila and mediastinum are normal. There may be continued opacity in left retrocardiac region, less pronounced in the interval. Mild increased interstitial markings in the right base are mildly improved in the interval. No other acute interval changes. IMPRESSION: Bibasilar opacities remain but appear improved in the interval. No other acute abnormalities. Electronically Signed   By: Dorise Bullion III M.D   On: 02/19/2019 15:11        Scheduled Meds: . acetaZOLAMIDE  250 mg Oral BID  . ALPRAZolam  0.25 mg Oral TID  . arformoterol  15 mcg Nebulization BID  . budesonide (PULMICORT) nebulizer solution  0.25 mg Nebulization BID  . enoxaparin (LOVENOX) injection  40 mg Subcutaneous Q24H  . furosemide  40 mg Oral Daily  . insulin aspart  0-5 Units Subcutaneous QHS  . insulin aspart  0-9 Units Subcutaneous TID WC  . insulin aspart  6 Units Subcutaneous  TID WC  . insulin glargine  35 Units Subcutaneous QHS  . methylPREDNISolone (SOLU-MEDROL) injection  40 mg Intravenous Q6H  . PARoxetine  40 mg Oral q morning - 10a  . potassium chloride  40 mEq Oral Once   Continuous Infusions: . ceFEPime (MAXIPIME) IV 2 g (02/20/19 0554)     LOS: 4 days     Georgette Shell, MD [facility]  If 7PM-7AM, please contact night-coverage [email or web address] Password [facility] 02/20/2019, 12:24 PM

## 2019-02-21 ENCOUNTER — Inpatient Hospital Stay (HOSPITAL_COMMUNITY): Payer: Medicare Other

## 2019-02-21 LAB — CULTURE, BLOOD (ROUTINE X 2)
Culture: NO GROWTH
Culture: NO GROWTH
Special Requests: ADEQUATE

## 2019-02-21 LAB — GLUCOSE, CAPILLARY
Glucose-Capillary: 263 mg/dL — ABNORMAL HIGH (ref 70–99)
Glucose-Capillary: 282 mg/dL — ABNORMAL HIGH (ref 70–99)
Glucose-Capillary: 146 mg/dL — ABNORMAL HIGH (ref 70–99)
Glucose-Capillary: 186 mg/dL — ABNORMAL HIGH (ref 70–99)

## 2019-02-21 MED ORDER — FUROSEMIDE 10 MG/ML IJ SOLN
20.0000 mg | Freq: Two times a day (BID) | INTRAMUSCULAR | Status: DC
  Administered 2019-02-21 – 2019-02-24 (×6): 20 mg via INTRAVENOUS
  Filled 2019-02-21 (×4): qty 2

## 2019-02-21 MED ORDER — FUROSEMIDE 10 MG/ML IJ SOLN
20.0000 mg | Freq: Once | INTRAMUSCULAR | Status: AC
  Administered 2019-02-21: 20 mg via INTRAVENOUS
  Filled 2019-02-21: qty 2

## 2019-02-21 NOTE — Progress Notes (Signed)
Patient removed her mask in hopes of eating her lunch.  Placed her on 15 HFNC to see she could tolerate being off of PPV; however, saturation dropped to the 70's and pt became dyspneic.   Lunch was not eaten and patient was placed back on Bipap.

## 2019-02-21 NOTE — Progress Notes (Signed)
PROGRESS NOTE    [name]  [id number] DOB: [birth year] DOA: 02/16/2019 PCP: Binnie Rail, MD    Brief Narrative:74 year old female past medical history significant for COPD on 3- 6L of oxygen according to the Dr.Yao,diastolic CHF, obesity, mitral valve prolapse, hypertension, hyperlipidemia, mediastinal mass and arrhythmias. Apparently, patient presented with worsening shortness of breath and wheezing. ER provider as the hospitalist team to admit patient. Patient is in significant respiratory distress and, therefore, cannot give any history. Pertinent labs revealed potassium of 2.6, cardiac BNP of 61.2, UA done revealed moderate hemoglobin and leukocyte with specific gravity of 1.013. Leukocytosis of 12.7 is noted. Chest x-ray revealed "Mild right basilar and moderate severity left basilar atelectasis and/or infiltrate. Focal opacification within the lateral aspect of the left lung base which is more prominent on the current study and may represent a more focal area of consolidation. An area of loculated pleural fluid cannot be excluded.Stable cardiomegaly".CT angio chest revealed "Moderate severity left lower lobe atelectasis and/or infiltrate with mild left-sided volume loss. Multiple stable subcentimeter right upper lobe and right middle lobe noncalcified lung nodules".  ED Course:On presentation to the hospital, temperature was 99 primary, heart rate of 102, respiratory rate of 20 to 30/min, blood pressure of 89-140/55-81 mmHg and O2 sat of 86 to 100%. Patient has been on various oxygen supplementation, from 3 L to 6 L/min via nasal cannula to high flow oxygen. Patient has had intermittent episodes of severe respiratory distress as documented above. Discussed option of ICU involvement with the emergency room provider, Dr. Darl Householder. Assessment & Plan:   Active Problems:   HCAP (healthcare-associated pneumonia)   Acute on chronic respiratory failure ([place])   Hypoxia  Concerned about having social problem   Muscular weakness   Goals of care, counseling/discussion   Hypoxemia   #1 acute hypoxic respiratory failure secondary to COPD exacerbation/HCAP--patient has end-stage COPD O2 dependent up to 6 L at home prior to admission to the hospital.  She has had multiple episodes where she had desaturated to the 40s in the 60s.  She is dependent on BiPAP on and off.  Appreciate the palliative care input.  Discussed with [name] that she is a hospice candidate.  He is going to speak with palliative and his mom this afternoon to come up with a plan.  Continue Solu-Medrol and nebulizer treatments.  She remains hypoxic tachypneic tachycardic intermittently.   Lasix IV CT of the chestleft lower lobe atelectasis versus infiltrates with mild left-sided volume loss, multiple stable subcentimeter right upper lobe right middle lobe noncalcified lung nodules. Continue cefepime BiPAP as needed. Chest x-ray right basilar opacities remain improved.  X-ray from 02/19/2019. Chest x-ray done 02/21/2019 stable left base opacification likely small to moderate effusion with associated atelectasis small amount right pleural fluid stable cardiomegaly with suggestion of minimal vascular congestion.  #2 diastolic heart failure-stable continue Lasix and Diamox.  Chest x-ray as above  #3 anxiety Xanax restarted.  #4 type 2 diabetes  CBG (last 3)  Recent Labs    02/20/19 2110 02/21/19 0659 02/21/19 1218  GLUCAP 307* 263* 282*    Continue Lantus 40 units and NovoLog 9 units 3 times daily.  The dose was just increased yesterday.  #5 depression continue Paxil.   Pressure Injury 08/30/18 Buttocks Right;Medial Stage II -  Partial thickness loss of dermis presenting as a shallow open ulcer with a red, pink wound bed without slough. (Active)  08/30/18 2134  Location: Buttocks  Location Orientation: Right;Medial  Staging:  Stage II -  Partial thickness loss of dermis presenting  as a shallow open ulcer with a red, pink wound bed without slough.  Wound Description (Comments):   Present on Admission:     Estimated body mass index is 35.57 kg/m as calculated from the following:   Height as of this encounter: 5\' 4"  (1.626 m).   Weight as of this encounter: 94 kg.  DVT prophylaxis:lovenox Code Status:Full code  family Communication:Called son without any response Disposition Plan:Patient came from [facility] care center she likely will need to go back to SNF facility. Barriers to discharge is patient still dependent on and hypoxic on 5 L of oxygen still needs IV steroids.  Will order Covid 02/21/2019.  Consultants:  None   Procedures:None  Antimicrobials:Cefepime Subjective: Earlier when I saw her today she was talking in full sentences and was feeling better about 30 minutes later she desaturated became anxious and was put on BiPAP  Objective: Vitals:   02/21/19 0954 02/21/19 1000 02/21/19 1100 02/21/19 1200  BP:  98/65 101/67 114/73  Pulse: 94 89 78 83  Resp: 14 14 (!) 23 13  Temp:      TempSrc:      SpO2: 100% 100% 100% 100%  Weight:      Height:        Intake/Output Summary (Last 24 hours) at 02/21/2019 1215 Last data filed at 02/20/2019 2300 Gross per 24 hour  Intake 240 ml  Output 900 ml  Net -660 ml   Filed Weights   02/18/19 0527 02/20/19 0621 02/21/19 0500  Weight: 93.3 kg 92.6 kg 94 kg    Examination:  General exam: Appears calm and comfortable  Respiratory system: Diminished breath sounds at the bases with crackles to auscultation. Respiratory effort normal. Cardiovascular system: S1 & S2 heard, RRR. No JVD, murmurs, rubs, gallops or clicks. No pedal edema. Gastrointestinal system: Abdomen is nondistended, soft and nontender. No organomegaly or masses felt. Normal bowel sounds heard. Central nervous system: Alert and oriented. No focal neurological deficits. Extremities: Symmetric 5 x 5 power. Skin: No rashes, lesions  or ulcers Psychiatry: Judgement and insight appear normal. Mood & affect appropriate.     Data Reviewed: I have personally reviewed following labs and imaging studies  CBC: Recent Labs  Lab 02/16/19 1746 02/16/19 1807 02/16/19 2301 02/17/19 0006 02/17/19 0056 02/17/19 0320 02/19/19 0256  WBC 12.7*  --   --   --  10.6* 9.4 15.8*  NEUTROABS 10.4*  --   --   --   --   --  14.4*  HGB 11.4*   < > 11.6* 11.9* 11.1* 9.8* 9.2*  HCT 43.1   < > 34.0* 35.0* 42.5 36.9 33.7*  MCV 77.2*  --   --   --  77.3* 75.6* 75.2*  PLT PLATELET CLUMPS NOTED ON SMEAR, COUNT APPEARS ADEQUATE  --   --   --  285 276 263   < > = values in this interval not displayed.   Basic Metabolic Panel: Recent Labs  Lab 02/16/19 1746 02/16/19 1746 02/16/19 1807 02/16/19 1807 02/16/19 2301 02/17/19 0006 02/17/19 0056 02/17/19 0320 02/17/19 0650 02/19/19 0256  NA 141   < > 141  141   < > 141 142 140  --  140 139  K 2.6*   < > 2.6*  2.7*   < > 3.2* 3.2* 3.4*  --  3.8 3.7  CL 104  --  104  --   --   --  111  --  112* 110  CO2 24  --   --   --   --   --  21*  --  19* 23  GLUCOSE 190*  --  187*  --   --   --  157*  --  243* 253*  BUN 15  --  15  --   --   --  12  --  13 19  CREATININE 1.11*   < > 1.00  --   --   --  0.80 0.81 0.79 0.87  CALCIUM 8.8*  --   --   --   --   --  8.3*  --  8.2* 8.6*  MG  --   --   --   --   --   --   --  1.7  --   --   PHOS  --   --   --   --   --   --   --  2.1*  --   --    < > = values in this interval not displayed.   GFR: Estimated Creatinine Clearance: 65.9 mL/min (by C-G formula based on SCr of 0.87 mg/dL). Liver Function Tests: Recent Labs  Lab 02/16/19 1746  AST 18  ALT 24  ALKPHOS 92  BILITOT 0.5  PROT 6.4*  ALBUMIN 3.4*   No results for input(s): LIPASE, AMYLASE in the last 168 hours. No results for input(s): AMMONIA in the last 168 hours. Coagulation Profile: No results for input(s): INR, PROTIME in the last 168 hours. Cardiac Enzymes: No results for  input(s): CKTOTAL, CKMB, CKMBINDEX, TROPONINI in the last 168 hours. BNP (last 3 results) No results for input(s): PROBNP in the last 8760 hours. HbA1C: No results for input(s): HGBA1C in the last 72 hours. CBG: Recent Labs  Lab 02/20/19 0843 02/20/19 1130 02/20/19 1630 02/20/19 2110 02/21/19 0659  GLUCAP 292* 338* 334* 307* 263*   Lipid Profile: No results for input(s): CHOL, HDL, LDLCALC, TRIG, CHOLHDL, LDLDIRECT in the last 72 hours. Thyroid Function Tests: No results for input(s): TSH, T4TOTAL, FREET4, T3FREE, THYROIDAB in the last 72 hours. Anemia Panel: No results for input(s): VITAMINB12, FOLATE, FERRITIN, TIBC, IRON, RETICCTPCT in the last 72 hours. Sepsis Labs: Recent Labs  Lab 02/16/19 1746 02/17/19 0320  LATICACIDVEN 2.1* 0.9    Recent Results (from the past 240 hour(s))  Blood Culture (routine x 2)     Status: None (Preliminary result)   Collection Time: 02/16/19  5:45 PM   Specimen: BLOOD  Result Value Ref Range Status   Specimen Description BLOOD RIGHT ANTECUBITAL  Final   Special Requests   Final    BOTTLES DRAWN AEROBIC ONLY Blood Culture results may not be optimal due to an inadequate volume of blood received in culture bottles   Culture   Final    NO GROWTH 4 DAYS Performed at [facility] [address], [place], [place] [address]    Report Status PENDING  Incomplete  Blood Culture (routine x 2)     Status: None (Preliminary result)   Collection Time: 02/16/19  5:45 PM   Specimen: BLOOD  Result Value Ref Range Status   Specimen Description BLOOD LEFT ANTECUBITAL  Final   Special Requests   Final    BOTTLES DRAWN AEROBIC AND ANAEROBIC Blood Culture adequate volume   Culture   Final    NO GROWTH 4 DAYS Performed at [facility], [place] [address]  [place], [place] [address]    Report Status PENDING  Incomplete  Urine culture     Status: Abnormal   Collection Time: 02/16/19  7:34 PM   Specimen: In/Out Cath Urine  Result Value Ref  Range Status   Specimen Description IN/OUT CATH URINE  Final   Special Requests   Final    NONE Performed at [facility], [place] [address] [place], [place] [address]    Culture MULTIPLE SPECIES PRESENT, SUGGEST RECOLLECTION (A)  Final   Report Status 02/18/2019 FINAL  Final  Respiratory Panel by RT PCR (Flu A&B, Covid) - Nasopharyngeal Swab     Status: None   Collection Time: 02/16/19  8:34 PM   Specimen: Nasopharyngeal Swab  Result Value Ref Range Status   SARS Coronavirus 2 by RT PCR NEGATIVE NEGATIVE Final    Comment: (NOTE) SARS-CoV-2 target nucleic acids are NOT DETECTED. The SARS-CoV-2 RNA is generally detectable in upper respiratoy specimens during the acute phase of infection. The lowest concentration of SARS-CoV-2 viral copies this assay can detect is 131 copies/mL. A negative result does not preclude SARS-Cov-2 infection and should not be used as the sole basis for treatment or other patient management decisions. A negative result may occur with  improper specimen collection/handling, submission of specimen other than nasopharyngeal swab, presence of viral mutation(s) within the areas targeted by this assay, and inadequate number of viral copies (<131 copies/mL). A negative result must be combined with clinical observations, patient history, and epidemiological information. The expected result is Negative. Fact Sheet for Patients:  PinkCheek.be Fact Sheet for Healthcare Providers:  GravelBags.it This test is not yet ap proved or cleared by the [place] FDA and  has been authorized for detection and/or diagnosis of SARS-CoV-2 by FDA under an Emergency Use Authorization (EUA). This EUA will remain  in effect (meaning this test can be used) for the duration of the COVID-19 declaration under Section 564(b)(1) of the Act, 21 U.S.C. section 360bbb-3(b)(1), unless the authorization is terminated or revoked  sooner.    Influenza A by PCR NEGATIVE NEGATIVE Final   Influenza B by PCR NEGATIVE NEGATIVE Final    Comment: (NOTE) The Xpert Xpress SARS-CoV-2/FLU/RSV assay is intended as an aid in  the diagnosis of influenza from Nasopharyngeal swab specimens and  should not be used as a sole basis for treatment. Nasal washings and  aspirates are unacceptable for Xpert Xpress SARS-CoV-2/FLU/RSV  testing. Fact Sheet for Patients: PinkCheek.be Fact Sheet for Healthcare Providers: GravelBags.it This test is not yet approved or cleared by the [place] FDA and  has been authorized for detection and/or diagnosis of SARS-CoV-2 by  FDA under an Emergency Use Authorization (EUA). This EUA will remain  in effect (meaning this test can be used) for the duration of the  Covid-19 declaration under Section 564(b)(1) of the Act, 21  U.S.C. section 360bbb-3(b)(1), unless the authorization is  terminated or revoked. Performed at [facility], [place] [address], [place], [place] [address]   SARS CORONAVIRUS 2 (TAT 6-24 HRS) Nasopharyngeal Nasopharyngeal Swab     Status: None   Collection Time: 02/17/19 12:50 AM   Specimen: Nasopharyngeal Swab  Result Value Ref Range Status   SARS Coronavirus 2 NEGATIVE NEGATIVE Final    Comment: (NOTE) SARS-CoV-2 target nucleic acids are NOT DETECTED. The SARS-CoV-2 RNA is generally detectable in upper and lower respiratory specimens during the acute phase of infection. Negative results do not preclude SARS-CoV-2 infection, do not rule out co-infections with  other pathogens, and should not be used as the sole basis for treatment or other patient management decisions. Negative results must be combined with clinical observations, patient history, and epidemiological information. The expected result is Negative. Fact Sheet for Patients: SugarRoll.be Fact Sheet for Healthcare  Providers: https://www.woods-Keshana Klemz.com/ This test is not yet approved or cleared by the [place] FDA and  has been authorized for detection and/or diagnosis of SARS-CoV-2 by FDA under an Emergency Use Authorization (EUA). This EUA will remain  in effect (meaning this test can be used) for the duration of the COVID-19 declaration under Section 56 4(b)(1) of the Act, 21 U.S.C. section 360bbb-3(b)(1), unless the authorization is terminated or revoked sooner. Performed at [facility], [place] [address] [place], [place] [address]   MRSA PCR Screening     Status: None   Collection Time: 02/17/19  3:25 AM   Specimen: Nasopharyngeal Swab  Result Value Ref Range Status   MRSA by PCR NEGATIVE NEGATIVE Final    Comment:        The GeneXpert MRSA Assay (FDA approved for NASAL specimens only), is one component of a comprehensive MRSA colonization surveillance program. It is not intended to diagnose MRSA infection nor to guide or monitor treatment for MRSA infections. Performed at [facility], [place] [address], [place], [place] [address]   SARS CORONAVIRUS 2 (TAT 6-24 HRS) Nasopharyngeal Nasopharyngeal Swab     Status: None   Collection Time: 02/20/19  6:50 PM   Specimen: Nasopharyngeal Swab  Result Value Ref Range Status   SARS Coronavirus 2 NEGATIVE NEGATIVE Final    Comment: (NOTE) SARS-CoV-2 target nucleic acids are NOT DETECTED. The SARS-CoV-2 RNA is generally detectable in upper and lower respiratory specimens during the acute phase of infection. Negative results do not preclude SARS-CoV-2 infection, do not rule out co-infections with other pathogens, and should not be used as the sole basis for treatment or other patient management decisions. Negative results must be combined with clinical observations, patient history, and epidemiological information. The expected result is Negative. Fact Sheet for  Patients: SugarRoll.be Fact Sheet for Healthcare Providers: https://www.woods-[employer]gor[place] [place].com/ This test is not yet approved or cleared by the [place] FDA and  has been authorized for detection and/or diagnosis of SARS-CoV-2 by FDA under an Emergency Use Authorization (EUA). This EUA will remain  in effect (meaning this test can be used) for the duration of the COVID-19 declaration under Section 56 4(b)(1) of the Act, 21 U.S.C. section 360bbb-3(b)(1), unless the authorization is terminated or revoked sooner. Performed at [facility], [place] [address], [place], [place] [address]          Radiology Studies: DG Chest 1 View  Result Date: 02/21/2019 CLINICAL DATA:  Hypoxemia. EXAM: CHEST  1 VIEW COMPARISON:  02/19/2019 FINDINGS: Lordotic technique is demonstrated. Stable left base opacification likely small to moderate effusion with associated atelectasis. Likely small amount of right pleural fluid. Stable hazy opacification of the infrahilar vessels suggesting mild vascular congestion. Stable mild cardiomegaly. Remainder of the exam is unchanged. IMPRESSION: 1. Stable left base opacification likely small to moderate effusion with associated atelectasis. Small amount right pleural fluid. 2. Stable cardiomegaly with suggestion of minimal vascular congestion. Electronically Signed   By: Marin Olp M.D.   On: 02/21/2019 10:02   DG Chest 1 View  Result Date: 02/19/2019 CLINICAL DATA:  Shortness of breath.  History of COPD. EXAM: CHEST  1 VIEW COMPARISON:  February 16, 2019 FINDINGS: No pneumothorax.  Stable cardiomegaly. The hila and mediastinum are normal. There may be continued opacity in left retrocardiac region, less pronounced in the interval. Mild increased interstitial markings in the right base are mildly improved in the interval. No other acute interval changes. IMPRESSION: Bibasilar opacities remain but appear improved in the interval. No  other acute abnormalities. Electronically Signed   By: Dorise Bullion III M.D   On: 02/19/2019 15:11        Scheduled Meds: . acetaZOLAMIDE  250 mg Oral BID  . ALPRAZolam  0.25 mg Oral TID  . arformoterol  15 mcg Nebulization BID  . budesonide (PULMICORT) nebulizer solution  0.25 mg Nebulization BID  . enoxaparin (LOVENOX) injection  40 mg Subcutaneous Q24H  . furosemide  40 mg Oral Daily  . insulin aspart  0-5 Units Subcutaneous QHS  . insulin aspart  0-9 Units Subcutaneous TID WC  . insulin aspart  9 Units Subcutaneous TID WC  . insulin glargine  40 Units Subcutaneous QHS  . methylPREDNISolone (SOLU-MEDROL) injection  40 mg Intravenous Q12H  . PARoxetine  40 mg Oral q morning - 10a  . potassium chloride  40 mEq Oral Once   Continuous Infusions: . ceFEPime (MAXIPIME) IV 2 g (02/21/19 0534)     LOS: 5 days     Georgette Shell, MD [facility]  If 7PM-7AM, please contact night-coverage [email or web address] Password [facility] 02/21/2019, 12:15 PM

## 2019-02-21 NOTE — Progress Notes (Signed)
Patient experienced respiratory distress earlier today and required bipap.  PRN Albuterol given with no change in respiratory status.  HR increased to the 140's and BS were diffuse rales.    Bipap was tolerated will with improvement in saturation from the 70's to 100.

## 2019-02-21 NOTE — Progress Notes (Addendum)
Pt went into respiratory distress with O2 saturation in the 60's then 40's with wet lung sounds upon auscultation.  Rapid response and RT called and Rodena Piety MD paged, all responded in room.  Pt placed on BIPAP.  Lasix IV push and chest X-ray STAT ordered and carried out.  Pt currently much more comfortable and O2 saturation consistently at 100%.  No further orders at this time.  Pt will continue to be closely monitored.

## 2019-02-21 NOTE — Progress Notes (Signed)
Palliative Medicine Inpatient Follow Up Note   HPI: Per hospitalist note --> 74 year old female past medical history significant for COPD on 3- 6L of oxygen according to the Dr.Yao,diastolic CHF, obesity, mitral valve prolapse, hypertension, hyperlipidemia, mediastinal mass and arrhythmias. Apparently, patient presented with worsening shortness of breath and wheezing. ER provider as the hospitalist team to admit patient. Patient is in significant respiratory distress and, therefore, cannot give any history. Pertinent labs revealed potassium of 2.6, cardiac BNP of 61.2, UA done revealed moderate hemoglobin and leukocyte with specific gravity of 1.013. Leukocytosis of 12.7 is noted. Chest x-ray revealed "Mild right basilar and moderate severity left basilar atelectasis and/or infiltrate. Focal opacification within the lateral aspect of the left lung base which is more prominent on the current study and may represent a more focal area of consolidation. An area of loculated pleural fluid cannot be excluded.Stable cardiomegaly".CT angio chest revealed "Moderate severity left lower lobe atelectasis and/or infiltrate with mild left-sided volume loss. Multiple stable subcentimeter right upper lobe and right middle lobe noncalcified lung nodules".   Today's Discussion (02/21/2019): Chart reviewed. Spoke to bedside RN, Shanon Brow. Assessed patient who was sleeping this morning, though she did arouse to touch and verbalized that she was feeling "alright." Aroung 93) per chart review it appears that she had an event requiring lasix administration and BiPap initiation.  Called patients niece, Judie Petit and sons Grayland Ormond and Addis. Was able to straighten out phone numbers in chart to better identify points of contact. Per Judie Petit and Grayland Ormond they did not understand that she has such significant lung disease. We talked about COPD and the effect it can have overtime inclusive of hypoxia and significant fatigue. We  talked about how it is an irreversible disease. They vocalized understanding.  Spoke to patients son [name] about her wishes to go back home. He stated that this is not possible. He feels that she wants to go back home so that she can continue smoking cigaretts. He shard that he would be willing to take her to [place] in his home if she understood that she cannot smoke. They had prior discussed her going to [place] under the guise of her living independently. Now this does not seem like a feasible plan.  We talked about her going to a facility her with hospice. I shared that presently it does not seem like she would immediately qualify for a hospice home though that could change if she worsens. We talked about her going to a convalescent home with hospice coming in.   He requested that we all have a conversation together given that his mother is fearful of the word hospice. We agreed to have a meeting for better clarity. Unfortunately unable to meet today as patient is on Bipap. Plan for Zoom meeting tomorrow at 1300 with patients son [name].   Discussed with patient the importance of continued conversation with family and their  medical providers regarding overall plan of care and treatment options, ensuring decisions are within the context of the patients values and GOCs.  Questions and concerns addressed   Vital Signs Vitals:   02/21/19 0954 02/21/19 1000  BP:  98/65  Pulse: 94 89  Resp: 14 14  Temp:    SpO2: 100% 100%    Intake/Output Summary (Last 24 hours) at 02/21/2019 1152 Last data filed at 02/20/2019 2300 Gross per 24 hour  Intake 240 ml  Output 900 ml  Net -660 ml   Last Weight  Most recent update: 02/21/2019  5:40  AM   Weight  94 kg (207 lb 3.7 oz)          Physical Exam Vitals and nursing note reviewed.  HENT:     Head: Normocephalic.  Eyes:     Pupils: Pupils are equal, round, and reactive to light.  Cardiovascular:     Rate and Rhythm: Normal rate and regular  rhythm.  Pulmonary:     Effort: Pulmonary effort is normal.     Breath sounds: Examination of the right-upper field reveals rhonchi. Examination of the left-upper field reveals rhonchi. Examination of the right-middle field reveals rhonchi. Examination of the left-middle field reveals rhonchi. Examination of the right-lower field reveals rhonchi. Examination of the left-lower field reveals rhonchi. Rhonchi present.  Abdominal:     Palpations: Abdomen is soft.  Musculoskeletal:        General: Normal range of motion.     Cervical back: Normal range of motion.  Skin:    General: Skin is warm and dry.     Capillary Refill: Capillary refill takes less than 2 seconds.  Neurological:     Mental Status: She is alert and oriented to person, place, and time.   SUMMARY OF RECOMMENDATIONS   DNR/DNI  Clarify goals of care via a Zoom meeting with [name] tomorrow at [place]:  DNR  Symptom Management:  Muscular Weakness:                 - PT                 - OT                 - Mobilize during the day Acute hypoxic respiratory failure secondary to COPD exacerbation: HCAP:                 - Per primary                 - Supplemental O2                 - Meets hospice criteria presently  Fatigue:                 -Pace yourself                 -Plan your day                 -Include naps and breaks                 -Fuel the body                 -Consider complementary therapies Social:                 - Lack of social support locally                 - [place] -->  8 hours of CNA weekly. Can have hospice without any conflict should it be decided upon though she would need assistance 24/7 which she is unable to afford on a fixed income                 - Called patients son [name] who endorses that his mother is notably stubborn but he understands that she needs 24/7 care and likely enrollment in hospice. He would like [place] all to speak together about this.  Unable to do so today due to acute hypoxemia requiring placement on BiPap.  Spiritual:                 - Chaplain consult                 - Prayer/medication                 - Guided meditation  Time Spent:  65 Greater than 50% of the time was spent in counseling and coordination of care ______________________________________________________________________________________ [facility] Team Team Cell Phone: [phone] Please utilize secure chat with additional questions, if there is no response within 30 minutes please call the above phone number  Palliative Medicine Team providers are available by phone from 7am to 7pm daily and can be reached through the team cell phone.  Should this patient require assistance outside of these hours, please call the patient's attending physician.

## 2019-02-21 NOTE — Significant Event (Addendum)
Rapid Response Event Note  Overview: Respiratory Distress   Initial Focused Assessment: Called by nursing staff with concerns of patient being hypoxic and in respiratory distress. Upon arrival, patient's saturations were in the 60s - she was in complete distress - HR in the 140s, she was hot, flushed, and diaphoretic. Lung sounds - crackles from the upper to lower fields throughout - RR in the 40s. MD called urgent to the bedside. Once patient was placed on BIPAP and MD was present, I left to another patient.   Interventions: -- BIPAP STAT - oxygen saturations improved quickly -- CXR STAT  -- Lasix IV  Plan of Care: -- Keep on BIPAP until SOB and WOB resolve -- Rest per medical team  1210 - I came by to see the patient - she was so much more comfortable now. She was resting comfortably - RR normal - HR 70s.   Event Summary:  Call Time 0907 Arrival Time 0909 End Time 0930   Clotilde Loth R

## 2019-02-22 DIAGNOSIS — Z515 Encounter for palliative care: Secondary | ICD-10-CM

## 2019-02-22 DIAGNOSIS — Z7189 Other specified counseling: Secondary | ICD-10-CM

## 2019-02-22 LAB — GLUCOSE, CAPILLARY
Glucose-Capillary: 215 mg/dL — ABNORMAL HIGH (ref 70–99)
Glucose-Capillary: 205 mg/dL — ABNORMAL HIGH (ref 70–99)
Glucose-Capillary: 277 mg/dL — ABNORMAL HIGH (ref 70–99)
Glucose-Capillary: 168 mg/dL — ABNORMAL HIGH (ref 70–99)
Glucose-Capillary: 183 mg/dL — ABNORMAL HIGH (ref 70–99)

## 2019-02-22 NOTE — Progress Notes (Signed)
PROGRESS NOTE    [name]  [id number] DOB: 1945/02/08 DOA: 02/16/2019 PCP: Binnie Rail, MD  Brief Narrative::74 year old female past medical history significant for COPD on 3- 6L of oxygen according to the Dr.Yao,diastolic CHF, obesity, mitral valve prolapse, hypertension, hyperlipidemia, mediastinal mass and arrhythmias. Apparently, patient presented with worsening shortness of breath and wheezing. ER provider as the hospitalist team to admit patient. Patient is in significant respiratory distress and, therefore, cannot give any history. Pertinent labs revealed potassium of 2.6, cardiac BNP of 61.2, UA done revealed moderate hemoglobin and leukocyte with specific gravity of 1.013. Leukocytosis of 12.7 is noted. Chest x-ray revealed "Mild right basilar and moderate severity left basilar atelectasis and/or infiltrate. Focal opacification within the lateral aspect of the left lung base which is more prominent on the current study and may represent a more focal area of consolidation. An area of loculated pleural fluid cannot be excluded.Stable cardiomegaly".CT angio chest revealed "Moderate severity left lower lobe atelectasis and/or infiltrate with mild left-sided volume loss. Multiple stable subcentimeter right upper lobe and right middle lobe noncalcified lung nodules".  ED Course:On presentation to the hospital, temperature was 99 primary, heart rate of 102, respiratory rate of 20 to 30/min, blood pressure of 89-140/55-81 mmHg and O2 sat of 86 to 100%. Patient has been on various oxygen supplementation, from 3 L to 6 L/min via nasal cannula to high flow oxygen. Patient has had intermittent episodes of severe respiratory distress as documented above. Discussed option of ICU involvement with the emergency room provider, Dr. Darl Householder.  02/22/2019 she is resting in bed on BiPAP appears comfortable no events overnight  Assessment & Plan:   Active Problems:   HCAP  (healthcare-associated pneumonia)   Acute on chronic respiratory failure ([place])   Hypoxia   Concerned about having social problem   Muscular weakness   Goals of care, counseling/discussion   Hypoxemia  #1 acute hypoxic respiratory failure secondary to COPD exacerbation/HCAP--patient has end-stage COPD O2 dependent up to 6 L at home prior to admission to the hospital.  She has had multiple episodes where she had desaturated to the 40s in the 60s.  She is dependent on BiPAP on and off.  Appreciate the palliative care input.  Discussed with [name] that she is a hospice candidate.  He is on his way to [facility] he will meet with his mother and palliative care to come up with a final plan.  Meanwhile continue BiPAP Solu-Medrol and nebulizer treatments.   She remains hypoxic tachypneic tachycardic intermittently.   Continue Lasix  CT of the chestleft lower lobe atelectasis versus infiltrates with mild left-sided volume loss, multiple stable subcentimeter right upper lobe right middle lobe noncalcified lung nodules. Continue cefepime BiPAP as needed.  Chest x-rayright basilar opacities remain improved. X-ray from 02/19/2019. Chest x-ray done 02/21/2019 stable left base opacification likely small to moderate effusion with associated atelectasis small amount right pleural fluid stable cardiomegaly with suggestion of minimal vascular congestion.  #2 diastolic heart failure-stable continue Lasix and Diamox. Chest x-ray as above  #3 anxiety Xanax restarted.  #4 type 2 diabetes CBG (last 3)  Recent Labs    02/21/19 2117 02/22/19 0650 02/22/19 1144  GLUCAP 186* 215* 205*     Continue Lantus 40 units and NovoLog 9 units 3 times daily.  Improved.  #5 depression continue Paxil.   Pressure Injury 08/30/18 Buttocks Right;Medial Stage II -  Partial thickness loss of dermis presenting as a shallow open ulcer with a red, pink wound  bed without slough. (Active)  08/30/18 2134    Location: Buttocks  Location Orientation: Right;Medial  Staging: Stage II -  Partial thickness loss of dermis presenting as a shallow open ulcer with a red, pink wound bed without slough.  Wound Description (Comments):   Present on Admission:     Estimated body mass index is 35.16 kg/m as calculated from the following:   Height as of this encounter: 5\' 4"  (1.626 m).   Weight as of this encounter: 92.9 kg.  DVT prophylaxis: Lovenox Code Status full code Family Communication: Discussed with her son Disposition Plan: Patient came from [facility] care center depending on what patient's and son's decision on she will be discharged to either hospice or [place] care center.   Consultants:   Palliative care  Procedures: None Antimicrobials: Cefepime Subjective: Resting in bed on BiPAP appears comfortable  Objective: Vitals:   02/22/19 0600 02/22/19 0759 02/22/19 0907 02/22/19 1139  BP:  119/65  111/65  Pulse:  76 62 65  Resp:  18 13 18   Temp:  98.1 F (36.7 C)  98.1 F (36.7 C)  TempSrc:  Axillary  Oral  SpO2:  99% 100% 99%  Weight: 92.9 kg     Height:        Intake/Output Summary (Last 24 hours) at 02/22/2019 1209 Last data filed at 02/22/2019 1144 Gross per 24 hour  Intake 110 ml  Output 1500 ml  Net -1390 ml   Filed Weights   02/20/19 0621 02/21/19 0500 02/22/19 0600  Weight: 92.6 kg 94 kg 92.9 kg    Examination:  General exam: Appears calm and comfortable  Respiratory system: Scattered rhonchi to auscultation. Respiratory effort normal. Cardiovascular system: S1 & S2 heard, RRR. No JVD, murmurs, rubs, gallops or clicks. No pedal edema. Gastrointestinal system: Abdomen is nondistended, soft and nontender. No organomegaly or masses felt. Normal bowel sounds heard. Central nervous system: Alert and oriented. No focal neurological deficits. Extremities: Symmetric 5 x 5 power. Skin: No rashes, lesions or ulcers Psychiatry: Judgement and insight appear normal. Mood &  affect appropriate.     Data Reviewed: I have personally reviewed following labs and imaging studies  CBC: Recent Labs  Lab 02/16/19 1746 02/16/19 1807 02/16/19 2301 02/17/19 0006 02/17/19 0056 02/17/19 0320 02/19/19 0256  WBC 12.7*  --   --   --  10.6* 9.4 15.8*  NEUTROABS 10.4*  --   --   --   --   --  14.4*  HGB 11.4*   < > 11.6* 11.9* 11.1* 9.8* 9.2*  HCT 43.1   < > 34.0* 35.0* 42.5 36.9 33.7*  MCV 77.2*  --   --   --  77.3* 75.6* 75.2*  PLT PLATELET CLUMPS NOTED ON SMEAR, COUNT APPEARS ADEQUATE  --   --   --  285 276 263   < > = values in this interval not displayed.   Basic Metabolic Panel: Recent Labs  Lab 02/16/19 1746 02/16/19 1746 02/16/19 1807 02/16/19 1807 02/16/19 2301 02/17/19 0006 02/17/19 0056 02/17/19 0320 02/17/19 0650 02/19/19 0256  NA 141   < > 141  141   < > 141 142 140  --  140 139  K 2.6*   < > 2.6*  2.7*   < > 3.2* 3.2* 3.4*  --  3.8 3.7  CL 104  --  104  --   --   --  111  --  112* 110  CO2 24  --   --   --   --   --  21*  --  19* 23  GLUCOSE 190*  --  187*  --   --   --  157*  --  243* 253*  BUN 15  --  15  --   --   --  12  --  13 19  CREATININE 1.11*   < > 1.00  --   --   --  0.80 0.81 0.79 0.87  CALCIUM 8.8*  --   --   --   --   --  8.3*  --  8.2* 8.6*  MG  --   --   --   --   --   --   --  1.7  --   --   PHOS  --   --   --   --   --   --   --  2.1*  --   --    < > = values in this interval not displayed.   GFR: Estimated Creatinine Clearance: 65.5 mL/min (by C-G formula based on SCr of 0.87 mg/dL). Liver Function Tests: Recent Labs  Lab 02/16/19 1746  AST 18  ALT 24  ALKPHOS 92  BILITOT 0.5  PROT 6.4*  ALBUMIN 3.4*   No results for input(s): LIPASE, AMYLASE in the last 168 hours. No results for input(s): AMMONIA in the last 168 hours. Coagulation Profile: No results for input(s): INR, PROTIME in the last 168 hours. Cardiac Enzymes: No results for input(s): CKTOTAL, CKMB, CKMBINDEX, TROPONINI in the last 168 hours. BNP  (last 3 results) No results for input(s): PROBNP in the last 8760 hours. HbA1C: No results for input(s): HGBA1C in the last 72 hours. CBG: Recent Labs  Lab 02/21/19 1218 02/21/19 1710 02/21/19 2117 02/22/19 0650 02/22/19 1144  GLUCAP 282* 146* 186* 215* 205*   Lipid Profile: No results for input(s): CHOL, HDL, LDLCALC, TRIG, CHOLHDL, LDLDIRECT in the last 72 hours. Thyroid Function Tests: No results for input(s): TSH, T4TOTAL, FREET4, T3FREE, THYROIDAB in the last 72 hours. Anemia Panel: No results for input(s): VITAMINB12, FOLATE, FERRITIN, TIBC, IRON, RETICCTPCT in the last 72 hours. Sepsis Labs: Recent Labs  Lab 02/16/19 1746 02/17/19 0320  LATICACIDVEN 2.1* 0.9    Recent Results (from the past 240 hour(s))  Blood Culture (routine x 2)     Status: None   Collection Time: 02/16/19  5:45 PM   Specimen: BLOOD  Result Value Ref Range Status   Specimen Description BLOOD RIGHT ANTECUBITAL  Final   Special Requests   Final    BOTTLES DRAWN AEROBIC ONLY Blood Culture results may not be optimal due to an inadequate volume of blood received in culture bottles   Culture   Final    NO GROWTH 5 DAYS Performed at [facility], [place] [address] [place], [place] [address]    Report Status 02/21/2019 FINAL  Final  Blood Culture (routine x 2)     Status: None   Collection Time: 02/16/19  5:45 PM   Specimen: BLOOD  Result Value Ref Range Status   Specimen Description BLOOD LEFT ANTECUBITAL  Final   Special Requests   Final    BOTTLES DRAWN AEROBIC AND ANAEROBIC Blood Culture adequate volume   Culture   Final    NO GROWTH 5 DAYS Performed at [facility], [place] [address], [place], [place] [address]    Report Status 02/21/2019 FINAL  Final  Urine culture     Status: Abnormal   Collection Time: 02/16/19  7:34 PM   Specimen: In/Out Cath Urine  Result Value Ref Range Status   Specimen Description IN/OUT CATH URINE  Final   Special Requests   Final    NONE Performed at  [facility], [address], [place], [place] [address]    Culture MULTIPLE SPECIES PRESENT, SUGGEST RECOLLECTION (A)  Final   Report Status 02/18/2019 FINAL  Final  Respiratory Panel by RT PCR (Flu A&B, Covid) - Nasopharyngeal Swab     Status: None   Collection Time: 02/16/19  8:34 PM   Specimen: Nasopharyngeal Swab  Result Value Ref Range Status   SARS Coronavirus 2 by RT PCR NEGATIVE NEGATIVE Final    Comment: (NOTE) SARS-CoV-2 target nucleic acids are NOT DETECTED. The SARS-CoV-2 RNA is generally detectable in upper respiratoy specimens during the acute phase of infection. The lowest concentration of SARS-CoV-2 viral copies this assay can detect is 131 copies/mL. A negative result does not preclude SARS-Cov-2 infection and should not be used as the sole basis for treatment or other patient management decisions. A negative result may occur with  improper specimen collection/handling, submission of specimen other than nasopharyngeal swab, presence of viral mutation(s) within the areas targeted by this assay, and inadequate number of viral copies (<131 copies/mL). A negative result must be combined with clinical observations, patient history, and epidemiological information. The expected result is Negative. Fact Sheet for Patients:  PinkCheek.be Fact Sheet for Healthcare Providers:  GravelBags.it This test is not yet ap proved or cleared by the [place] FDA and  has been authorized for detection and/or diagnosis of SARS-CoV-2 by FDA under an Emergency Use Authorization (EUA). This EUA will remain  in effect (meaning this test can be used) for the duration of the COVID-19 declaration under Section 564(b)(1) of the Act, 21 U.S.C. section 360bbb-3(b)(1), unless the authorization is terminated or revoked sooner.    Influenza A by PCR NEGATIVE NEGATIVE Final   Influenza B by PCR NEGATIVE NEGATIVE Final    Comment:  (NOTE) The Xpert Xpress SARS-CoV-2/FLU/RSV assay is intended as an aid in  the diagnosis of influenza from Nasopharyngeal swab specimens and  should not be used as a sole basis for treatment. Nasal washings and  aspirates are unacceptable for Xpert Xpress SARS-CoV-2/FLU/RSV  testing. Fact Sheet for Patients: PinkCheek.be Fact Sheet for Healthcare Providers: GravelBags.it This test is not yet approved or cleared by the [place] FDA and  has been authorized for detection and/or diagnosis of SARS-CoV-2 by  FDA under an Emergency Use Authorization (EUA). This EUA will remain  in effect (meaning this test can be used) for the duration of the  Covid-19 declaration under Section 564(b)(1) of the Act, 21  U.S.C. section 360bbb-3(b)(1), unless the authorization is  terminated or revoked. Performed at [facility], [place] [address] [place], [place] [address]   SARS CORONAVIRUS 2 (TAT 6-24 HRS) Nasopharyngeal Nasopharyngeal Swab     Status: None   Collection Time: 02/17/19 12:50 AM   Specimen: Nasopharyngeal Swab  Result Value Ref Range Status   SARS Coronavirus 2 NEGATIVE NEGATIVE Final    Comment: (NOTE) SARS-CoV-2 target nucleic acids are NOT DETECTED. The SARS-CoV-2 RNA is generally detectable in upper and lower respiratory specimens during the acute phase of infection. Negative results do not preclude SARS-CoV-2 infection, do not rule out co-infections with other pathogens, and should not be used as the sole basis for treatment or other patient management decisions. Negative results must be combined with clinical observations,  patient history, and epidemiological information. The expected result is Negative. Fact Sheet for Patients: SugarRoll.be Fact Sheet for Healthcare Providers: https://www.woods-Wanza Szumski.com/ This test is not yet approved or cleared by the [place] FDA  and  has been authorized for detection and/or diagnosis of SARS-CoV-2 by FDA under an Emergency Use Authorization (EUA). This EUA will remain  in effect (meaning this test can be used) for the duration of the COVID-19 declaration under Section 56 4(b)(1) of the Act, 21 U.S.C. section 360bbb-3(b)(1), unless the authorization is terminated or revoked sooner. Performed at [facility], [place] [address] [place], [place] [address]   MRSA PCR Screening     Status: None   Collection Time: 02/17/19  3:25 AM   Specimen: Nasopharyngeal Swab  Result Value Ref Range Status   MRSA by PCR NEGATIVE NEGATIVE Final    Comment:        The GeneXpert MRSA Assay (FDA approved for NASAL specimens only), is one component of a comprehensive MRSA colonization surveillance program. It is not intended to diagnose MRSA infection nor to guide or monitor treatment for MRSA infections. Performed at [facility], [place] [address], [place], [place] [address]   SARS CORONAVIRUS 2 (TAT 6-24 HRS) Nasopharyngeal Nasopharyngeal Swab     Status: None   Collection Time: 02/20/19  6:50 PM   Specimen: Nasopharyngeal Swab  Result Value Ref Range Status   SARS Coronavirus 2 NEGATIVE NEGATIVE Final    Comment: (NOTE) SARS-CoV-2 target nucleic acids are NOT DETECTED. The SARS-CoV-2 RNA is generally detectable in upper and lower respiratory specimens during the acute phase of infection. Negative results do not preclude SARS-CoV-2 infection, do not rule out co-infections with other pathogens, and should not be used as the sole basis for treatment or other patient management decisions. Negative results must be combined with clinical observations, patient history, and epidemiological information. The expected result is Negative. Fact Sheet for Patients: SugarRoll.be Fact Sheet for Healthcare Providers: https://www.woods-Icela Glymph.com/ This test is not yet approved or  cleared by the [place] FDA and  has been authorized for detection and/or diagnosis of SARS-CoV-2 by FDA under an Emergency Use Authorization (EUA). This EUA will remain  in effect (meaning this test can be used) for the duration of the COVID-19 declaration under Section 56 4(b)(1) of the Act, 21 U.S.C. section 360bbb-3(b)(1), unless the authorization is terminated or revoked sooner. Performed at [facility], [place] [address], [place], [place] [address]          Radiology Studies: DG Chest 1 View  Result Date: 02/21/2019 CLINICAL DATA:  Hypoxemia. EXAM: CHEST  1 VIEW COMPARISON:  02/19/2019 FINDINGS: Lordotic technique is demonstrated. Stable left base opacification likely small to moderate effusion with associated atelectasis. Likely small amount of right pleural fluid. Stable hazy opacification of the infrahilar vessels suggesting mild vascular congestion. Stable mild cardiomegaly. Remainder of the exam is unchanged. IMPRESSION: 1. Stable left base opacification likely small to moderate effusion with associated atelectasis. Small amount right pleural fluid. 2. Stable cardiomegaly with suggestion of minimal vascular congestion. Electronically Signed   By: Marin Olp M.D.   On: 02/21/2019 10:02        Scheduled Meds: . acetaZOLAMIDE  250 mg Oral BID  . ALPRAZolam  0.25 mg Oral TID  . arformoterol  15 mcg Nebulization BID  . budesonide (PULMICORT) nebulizer solution  0.25 mg Nebulization BID  . enoxaparin (LOVENOX) injection  40 mg Subcutaneous Q24H  . furosemide  20 mg  Intravenous BID  . insulin aspart  0-5 Units Subcutaneous QHS  . insulin aspart  0-9 Units Subcutaneous TID WC  . insulin aspart  9 Units Subcutaneous TID WC  . insulin glargine  40 Units Subcutaneous QHS  . methylPREDNISolone (SOLU-MEDROL) injection  40 mg Intravenous Q12H  . PARoxetine  40 mg Oral q morning - 10a  . potassium chloride  40 mEq Oral Once   Continuous Infusions: . ceFEPime  (MAXIPIME) IV 2 g (02/22/19 0546)     LOS: 6 days    Georgette Shell, MD [facility]   If 7PM-7AM, please contact night-coverage [email or web address] Password [facility] 02/22/2019, 12:09 PM

## 2019-02-22 NOTE — Evaluation (Signed)
Occupational Therapy Evaluation Patient Details Name: [name] MRN: [id number] DOB: 05-01-1945 Today's Date: 02/22/2019    History of Present Illness Pt is 74yo female with pmh significant for COPD on 3 - 6 L of oxygen, diastolic CHF, obesity, mitral valve prolapse, hypertension, hyperlipidemia, mediastinal mass and arrhythmias.  Patient presented with worsening shortness of breath and wheezing.  Pt admitted with hypoxic respiratory failure secondary to COPD exacerbation.   Clinical Impression   Pt presents to OT with deficits in functional activity tolerance, increase supplemental O2 reliance, and impairments in balance and completion of ADLs. Pt with poor insight into CLOF and safety awareness. No desaturation on bipap this session. Pt with intermittent bouts of confusion at times as well. Recommend SNF placement for safety and to continue rehab efforts. OT will follow acutely.     Follow Up Recommendations  SNF    Equipment Recommendations  None recommended by OT       Precautions / Restrictions Precautions Precautions: Fall Precaution Comments: watch 02 Restrictions Weight Bearing Restrictions: No      Mobility Bed Mobility Overal bed mobility: Needs Assistance Bed Mobility: Supine to Sit Rolling: Supervision         General bed mobility comments: Cues for safety, no physical assist required  Transfers Overall transfer level: Needs assistance Equipment used: Rolling walker (2 wheeled) Transfers: Sit to/from [employer] Transfers Sit to Stand: Min guard Stand pivot transfers: Min guard       General transfer comment: Cues for upright posture and pacing    Balance Overall balance assessment: Needs assistance Sitting-balance support: No upper extremity supported;Feet supported Sitting balance-Leahy Scale: Good Sitting balance - Comments: supervision   Standing balance support: During functional activity;Single extremity supported Standing balance-Leahy  Scale: Fair Standing balance comment: supervision                           ADL either performed or assessed with clinical judgement   ADL Overall ADL's : Needs assistance/impaired Eating/Feeding: Independent;Sitting   Grooming: Supervision/safety;Sitting   Upper Body Bathing: Sitting;Set up   Lower Body Bathing: Minimal assistance;Sit to/from stand   Upper Body Dressing : Sitting;Set up   Lower Body Dressing: Minimal assistance;Sit to/from stand   Toilet Transfer: Psychologist, sport and exercise Details (indicate cue type and reason): Simulated  Toileting- Clothing Manipulation and Hygiene: Min guard;Sit to/from stand       Functional mobility during ADLs: Min guard;Rolling walker General ADL Comments: Decreased activity tolerance, pt with abrupt stand > sit from EOB      Vision Patient Visual Report: No change from baseline Vision Assessment?: No apparent visual deficits            Pertinent Vitals/Pain Pain Assessment: No/denies pain     Hand Dominance Right   Extremity/Trunk Assessment Upper Extremity Assessment Upper Extremity Assessment: Generalized weakness   Lower Extremity Assessment Lower Extremity Assessment: Generalized weakness   Cervical / Trunk Assessment Cervical / Trunk Assessment: Kyphotic   Communication Communication Communication: No difficulties   Cognition Arousal/Alertness: Awake/alert Behavior During Therapy: WFL for tasks assessed/performed Overall Cognitive Status: Within Functional Limits for tasks assessed                                 General Comments: Pt oriented, not as confused as previous visits. Pt with poor safety awareness re current abilities    General Comments  Pt on  Bipap during session, Spo2 remained at 100%            Home Living Family/patient expects to be discharged to:: Private residence Living Arrangements: Alone Available Help at Discharge: Personal care  attendant(2x/week for 4 hours) Type of Home: Apartment Home Access: Ramped entrance     Home Layout: One level     Bathroom Shower/Tub: Teacher, early years/pre: Standard Bathroom Accessibility: Yes How Accessible: Accessible via walker(pt uses rollator) Home Equipment: Walker - standard   Additional Comments: EMR reports BSC and shower chair, pt did not endorse this information, stating she only had a rollator      Prior Functioning/Environment Level of Independence: Needs assistance  Gait / Transfers Assistance Needed: walks with a rollator in home ADL's / Homemaking Assistance Needed: Aide assisted with meal prep and IADL tasks. pt reports she was indepndent with ADLs but performed mostly sponge baths            OT Problem List: Decreased strength;Impaired balance (sitting and/or standing);Decreased activity tolerance;Decreased knowledge of use of DME or AE;Cardiopulmonary status limiting activity;Decreased knowledge of precautions;Decreased safety awareness;Decreased cognition      OT Treatment/Interventions: Self-care/ADL training;DME and/or AE instruction;Therapeutic activities;Balance training;Therapeutic exercise;Energy conservation;Patient/family education    OT Goals(Current goals can be found in the care plan section) Acute Rehab OT Goals Patient Stated Goal: return home OT Goal Formulation: With patient Time For Goal Achievement: 03/08/19 Potential to Achieve Goals: Good  OT Frequency: Min 2X/week   Barriers to D/C: Decreased caregiver support             AM-PAC OT "6 Clicks" Daily Activity     Outcome Measure Help from another person eating meals?: None Help from another person taking care of personal grooming?: None Help from another person toileting, which includes using toliet, bedpan, or urinal?: A Little Help from another person bathing (including washing, rinsing, drying)?: A Little Help from another person to put on and taking off  regular upper body clothing?: A Little Help from another person to put on and taking off regular lower body clothing?: A Little 6 Click Score: 20   End of Session Equipment Utilized During Treatment: Oxygen Nurse Communication: Mobility status  Activity Tolerance: Patient tolerated treatment well Patient left: in bed;with call bell/phone within reach;with bed alarm set  OT Visit Diagnosis: Muscle weakness (generalized) (M62.81);Unsteadiness on feet (R26.81)                Time: [id number] OT Time Calculation (min): 19 min Charges:  OT General Charges $OT Visit: 1 Visit OT Evaluation $OT Eval Low Complexity: 1 Low  Curtis Sites OTR/L  02/22/2019, 10:28 AM

## 2019-02-22 NOTE — Progress Notes (Signed)
Patient off Bipap continuous as is doing well on 7L O2 humidified.  No distress noted at this time per patient.  Will continue to monitor.  Made Bipap order PRN, Bipap at bedside if needed.

## 2019-02-23 LAB — GLUCOSE, CAPILLARY
Glucose-Capillary: 260 mg/dL — ABNORMAL HIGH (ref 70–99)
Glucose-Capillary: 235 mg/dL — ABNORMAL HIGH (ref 70–99)
Glucose-Capillary: 205 mg/dL — ABNORMAL HIGH (ref 70–99)
Glucose-Capillary: 215 mg/dL — ABNORMAL HIGH (ref 70–99)

## 2019-02-23 LAB — CREATININE, SERUM
Creatinine, Ser: 0.86 mg/dL (ref 0.44–1.00)
GFR calc Af Amer: >60 mL/min (ref >60)
GFR calc non Af Amer: >60 mL/min (ref >60)

## 2019-02-23 LAB — SARS CORONAVIRUS 2 (TAT 6-24 HRS): SARS Coronavirus 2: NEGATIVE

## 2019-02-23 MED ORDER — METHYLPREDNISOLONE SODIUM SUCC 40 MG IJ SOLR
40.0000 mg | INTRAMUSCULAR | Status: DC
  Administered 2019-02-24: 14:00:00 40 mg via INTRAVENOUS
  Filled 2019-02-23: qty 1

## 2019-02-23 NOTE — Progress Notes (Signed)
RN had put patient back on Bipap just a little earlier at patient's request.  RN called this RT to come assess machine.  Stated she could not get patient's mask fixed right and machine had been alarming.  RT entered room and leak was better and patient was getting cleaned up by Cordelia, NT.  Patient turned over on her side to get comfortable, Bipap running correctly, no distress noted.  Will continue to monitor.

## 2019-02-23 NOTE — Progress Notes (Signed)
Physical Therapy Treatment Patient Details Name: [name] MRN: [id number] DOB: 21-Jan-1946 Today's Date: 02/23/2019    History of Present Illness Pt is 74yo female with pmh significant for COPD on 3 - 6 L of oxygen, diastolic CHF, obesity, mitral valve prolapse, hypertension, hyperlipidemia, mediastinal mass and arrhythmias.  Patient presented with worsening shortness of breath and wheezing.  Pt admitted with hypoxic respiratory failure secondary to COPD exacerbation.    PT Comments    Patient seen for mobility progression. Pt tolerated total gait distance of 40 ft with 2 seated breaks due to DOE. Attempted mobilizing with 4L O2 however SpO2 desat to 80% and up to high 80s with 6L O2 via [place]. Continue to progress as tolerated with anticipated d/c to SNF for further skilled PT services.     Follow Up Recommendations  SNF     Equipment Recommendations  Other (comment)(tbd next venue)    Recommendations for Other Services       Precautions / Restrictions Precautions Precautions: Fall Precaution Comments: watch 02    Mobility  Bed Mobility Overal bed mobility: Modified Independent Bed Mobility: Sidelying to Sit              Transfers Overall transfer level: Needs assistance Equipment used: (bariatric rollator) Transfers: Sit to/from Stand Sit to Stand: Supervision         General transfer comment: cues for use of brakes  Ambulation/Gait Ambulation/Gait assistance: Supervision;Min guard Gait Distance (Feet): (~40 ft total with 2 seated rest breaks) Assistive device: (bariatric rollator) Gait Pattern/deviations: Decreased stride length;Step-through pattern Gait velocity: reduced   General Gait Details: grossly supervision for safety; cues for safe use of AD; pt tends to sit abruptly on rollator without locking brakes   Stairs             Wheelchair Mobility    Modified Rankin (Stroke Patients Only)       Balance Overall balance assessment: Needs  assistance Sitting-balance support: No upper extremity supported;Feet supported Sitting balance-Leahy Scale: Good     Standing balance support: During functional activity;Bilateral upper extremity supported Standing balance-Leahy Scale: Poor                              Cognition Arousal/Alertness: Awake/alert Behavior During Therapy: WFL for tasks assessed/performed Overall Cognitive Status: Within Functional Limits for tasks assessed                                 General Comments: pt with decreased safety awareness but likely pt's baseline      Exercises      General Comments General comments (skin integrity, edema, etc.): pt on 6L O2 via [place] to maintain SpO2 upper 80s      Pertinent Vitals/Pain Pain Assessment: No/denies pain    Home Living                      Prior Function            PT Goals (current goals can now be found in the care plan section) Progress towards PT goals: Progressing toward goals    Frequency    Min 2X/week      PT Plan Current plan remains appropriate    Co-evaluation              AM-PAC PT "6 Clicks" Mobility   Outcome Measure  Help needed turning from your back to your side while in a flat bed without using bedrails?: None Help needed moving from lying on your back to sitting on the side of a flat bed without using bedrails?: A Little Help needed moving to and from a bed to a chair (including a wheelchair)?: A Little Help needed standing up from a chair using your arms (e.g., wheelchair or bedside chair)?: A Little Help needed to walk in hospital room?: A Little Help needed climbing 3-5 steps with a railing? : A Lot 6 Click Score: 18    End of Session Equipment Utilized During Treatment: Oxygen Activity Tolerance: Patient limited by fatigue Patient left: in chair;with chair alarm set;with call bell/phone within reach Nurse Communication: Mobility status PT Visit Diagnosis: Muscle  weakness (generalized) (M62.81);Other abnormalities of gait and mobility (R26.89)     Time: [id number] PT Time Calculation (min) (ACUTE ONLY): 30 min  Charges:  $Gait Training: 23-37 mins                     Earney Navy, PTA Acute Rehabilitation Services Pager: [phone] Office: [phone]     Darliss Cheney 02/23/2019, 4:47 PM

## 2019-02-23 NOTE — TOC Progression Note (Signed)
Transition of Care [facility]) - Progression Note    Patient Details  Name: [name] MRN: [id number] Date of Birth: [birth year]  Transition of Care [facility]) CM/SW [place], [place] Phone Number: 02/23/2019, 3:52 PM  Clinical Narrative:     CSW visit with the patient at bedside. Patient expressed she really wanted to go home. CSW encourage the patient to really consider if going home was a safe plan. After further conversation patient reluctantly agrees to discharge back to [facility]. Patient acknowledges she needs 24/7 supervision and states she is working "on a few things". CSW advised until she get things worked out it is best/safer for her to go back to  [facility], where she is not alone.  CSW contacted [facility] advised patient has been using bi-pap and they should order her one for SNF. [facility] confirmed placement.    Thurmond Butts, MSW, LCSWA Clinical Social Worker        Barriers to Discharge: Continued Medical Work up  Expected Discharge Plan and Services   In-house Referral: Clinical Social Work     Living arrangements for the past 2 months: Single Family Home                                       Social Determinants of Health (SDOH) Interventions    Readmission Risk Interventions Readmission Risk Prevention Plan 11/06/2018 11/05/2018  Transportation Screening Complete Complete  PCP or Specialist Appt within 3-5 Days Complete -  [place]RI or [place] Complete Complete  Social Work Consult for [place] Planning/Counseling Complete Complete  Palliative Care Screening Complete Complete  Medication Review Press photographer) Complete Complete  Some recent data might be hidden

## 2019-02-23 NOTE — Progress Notes (Addendum)
Inpatient Diabetes Program Recommendations  AACE/ADA: New Consensus Statement on Inpatient Glycemic Control   Target Ranges:  Prepandial:   less than 140 mg/dL      Peak postprandial:   less than 180 mg/dL (1-2 hours)      Critically ill patients:  140 - 180 mg/dL   Results for [name], [name] (MRN [id number]) as of 02/23/2019 13:09  Ref. Range 02/22/2019 06:50 02/22/2019 11:44 02/22/2019 16:56 02/22/2019 20:59 02/22/2019 21:12 02/23/2019 08:25 02/23/2019 11:05  Glucose-Capillary Latest Ref Range: 70 - 99 mg/dL 215 (H) 205 (H) 277 (H) 168 (H) 183 (H) 260 (H) 235 (H)   Review of Glycemic Control  Diabetes history: DM2 Outpatient Diabetes medications: Lantus 25 units daily, Novolog 3 units TID with meals Current orders for Inpatient glycemic control: Lantus 40 units QHS, Novolog 9 units TID with meals, Novolog 0-9 units TID with meals, Novolog 0-5 units QHS; Solumedrol 40 mg Q12H  Inpatient Diabetes Program Recommendations:   Insulin - Basal: If steroids are continued, please consider increasing Lantus to 44 units QHS.  Thanks, Barnie Alderman, RN, MSN, CDE Diabetes Coordinator Inpatient Diabetes Program [phone] (Team Pager from 8am to 5pm)

## 2019-02-23 NOTE — Progress Notes (Signed)
Patient ID: [name], female   DOB: [birth year], 74 y.o.   MRN: [id number]  This NP visited patient at the bedside as a follow up to for palliative medicine needs and emotional support.  Patient is weak and intermittently requiring BiPap.  She is high risk for decompensation 2/2 to underlying co-morbid ites.  A video chat was set up for today at 1:00 with the patient her son Craig/ in [place] and the palliative medicine team.  A long conversation was had regarding the seriousness of her current medical situation: Diagnosis, prognosis, treatment options, advance directives, disposition and anticipatory care needs.  Both patient and [name] acknowledge that there is no family or friends local that are willing to participate in her care.  The patient has had strained relationship with family and friends for the past several years.  We discussed her multiple comorbidities and high risk for decompensation.  Myself and her son verbalize that  although the patient wants to discharge home that is not a safe viable option.  She does have 4 hours 5 days a week of in-home nurses aide support but that would mean she would be alone the rest of the time.  Although the patient says "she does not care what anybody says I am going home", her son encourages her to consider something different.  I shared with both, son and patient,  that if the patient having capacity makes decisions that are not recommended and [place] she needs to accept the consequences of those choices.  Safety is of great consideration  We discussed hospice benefit and at this point in time the patient is hospice eligible.  Unsure at this time what disposition will be, incision team working on options.  Discussed with patient the importance of continued conversation with her medical providers regarding overall plan of care and treatment options,  ensuring decisions are within the context of the patients values and  GOCs.  Questions and concerns addressed    Discussed with attending  Total time spent on the unit was 60 minutes  Greater than 50% of the time was spent in counseling and coordination of care  Wadie Lessen NP  Palliative Medicine Team Team Phone # [phone] Pager [phone]

## 2019-02-23 NOTE — Progress Notes (Signed)
PROGRESS NOTE    [name]  [id number] DOB: 10/04/1945 DOA: 02/16/2019 PCP: Binnie Rail, MD   Brief Narrative: 74 year old female past medical history significant for COPD on 3- 6L of oxygen according to the Dr.Yao,diastolic CHF, obesity, mitral valve prolapse, hypertension, hyperlipidemia, mediastinal mass and arrhythmias. Apparently, patient presented with worsening shortness of breath and wheezing. ER provider as the hospitalist team to admit patient. Patient is in significant respiratory distress and, therefore, cannot give any history. Pertinent labs revealed potassium of 2.6, cardiac BNP of 61.2, UA done revealed moderate hemoglobin and leukocyte with specific gravity of 1.013. Leukocytosis of 12.7 is noted. Chest x-ray revealed "Mild right basilar and moderate severity left basilar atelectasis and/or infiltrate. Focal opacification within the lateral aspect of the left lung base which is more prominent on the current study and may represent a more focal area of consolidation. An area of loculated pleural fluid cannot be excluded.Stable cardiomegaly".CT angio chest revealed "Moderate severity left lower lobe atelectasis and/or infiltrate with mild left-sided volume loss. Multiple stable subcentimeter right upper lobe and right middle lobe noncalcified lung nodules".  ED Course:On presentation to the hospital, temperature was 99 primary, heart rate of 102, respiratory rate of 20 to 30/min, blood pressure of 89-140/55-81 mmHg and O2 sat of 86 to 100%. Patient has been on various oxygen supplementation, from 3 L to 6 L/min via nasal cannula to high flow oxygen. Patient has had intermittent episodes of severe respiratory distress as documented above. Discussed option of ICU involvement with the emergency room provider, Dr. Darl Householder.  Assessment & Plan:   Active Problems:   HCAP (healthcare-associated pneumonia)   Acute on chronic respiratory failure ([place])   Hypoxia  Concerned about having social problem   Muscular weakness   Goals of care, counseling/discussion   Hypoxemia   #1 acute hypoxic respiratory failure secondary to COPD exacerbation/HCAP--patient has end-stage COPD O2 dependent up to 6 L at home prior to admission to the hospital.  She has had multiple episodes where she had desaturated to the 40s in the 60s.  She is dependent on BiPAP on and off.  Appreciate the palliative care input.decrease solumedrol to daily.  She remains hypoxic tachypneic tachycardic intermittently.   Lasix IV 20 bid CT of the chestleft lower lobe atelectasis versus infiltrates with mild left-sided volume loss, multiple stable subcentimeter right upper lobe right middle lobe noncalcified lung nodules. Continue cefepime BiPAP as needed. Chest x-rayright basilar opacities remain improved. X-ray from 02/19/2019. Chest x-ray done 02/21/2019 stable left base opacification likely small to moderate effusion with associated atelectasis small amount right pleural fluid stable cardiomegaly with suggestion of minimal vascular congestion. I/o - 0000000  #2 diastolic heart failure-stable continue Lasix and Diamox. Chest x-ray as above  #3 anxiety Xanax restarted.  #4 type 2 diabetes CBG (last 3)  Recent Labs    02/22/19 2112 02/23/19 0825 02/23/19 1105  GLUCAP 183* 260* 235*   Increase  Lantus 45 units and NovoLog 9 units 3 times daily.   #5 depression continue Paxil.   Pressure Injury 08/30/18 Buttocks Right;Medial Stage II -  Partial thickness loss of dermis presenting as a shallow open ulcer with a red, pink wound bed without slough. (Active)  08/30/18 2134  Location: Buttocks  Location Orientation: Right;Medial  Staging: Stage II -  Partial thickness loss of dermis presenting as a shallow open ulcer with a red, pink wound bed without slough.  Wound Description (Comments):   Present on Admission:  Estimated body mass index is 35.16 kg/m as  calculated from the following:   Height as of this encounter: 5\' 4"  (1.626 m).   Weight as of this encounter: 92.9 kg.  DVT prophylaxis: Lovenox Code Status dnr Family Communication: Discussed with her son Disposition Plan: Patient came from [facility].  Plan to return to SNF waiting to hear from palliative care  Dc to snf in am Consultants:   Palliative care  Procedures: None Antimicrobials: Cefepime  Subjective: Resting in bed gets hypoxic with any exertion  Objective: Vitals:   02/23/19 0844 02/23/19 0848 02/23/19 0859 02/23/19 1229  BP:   (!) 93/50 97/66  Pulse:   76 94  Resp:   17 18  Temp:  97.8 F (36.6 C) 97.8 F (36.6 C) 97.7 F (36.5 C)  TempSrc:  Oral Oral Oral  SpO2: 99% 97% 94% 90%  Weight:      Height:        Intake/Output Summary (Last 24 hours) at 02/23/2019 1406 Last data filed at 02/22/2019 2127 Gross per 24 hour  Intake 120 ml  Output 200 ml  Net -80 ml   Filed Weights   02/20/19 0621 02/21/19 0500 02/22/19 0600  Weight: 92.6 kg 94 kg 92.9 kg    Examination:  General exam: Appears calm and comfortable  Respiratory system: wheezing decreased to auscultation. Respiratory effort normal. Cardiovascular system: S1 & S2 heard, RRR. No JVD, murmurs, rubs, gallops or clicks. No pedal edema. Gastrointestinal system: Abdomen is nondistended, soft and nontender. No organomegaly or masses felt. Normal bowel sounds heard. Central nervous system: Alert and oriented. No focal neurological deficits. Extremities: Symmetric 5 x 5 power. Skin: No rashes, lesions or ulcers Psychiatry: Judgement and insight appear normal. Mood & affect appropriate.     Data Reviewed: I have personally reviewed following labs and imaging studies  CBC: Recent Labs  Lab 02/16/19 1746 02/16/19 1807 02/16/19 2301 02/17/19 0006 02/17/19 0056 02/17/19 0320 02/19/19 0256  WBC 12.7*  --   --   --  10.6* 9.4 15.8*  NEUTROABS 10.4*  --   --   --   --   --  14.4*   HGB 11.4*   < > 11.6* 11.9* 11.1* 9.8* 9.2*  HCT 43.1   < > 34.0* 35.0* 42.5 36.9 33.7*  MCV 77.2*  --   --   --  77.3* 75.6* 75.2*  PLT PLATELET CLUMPS NOTED ON SMEAR, COUNT APPEARS ADEQUATE  --   --   --  285 276 263   < > = values in this interval not displayed.   Basic Metabolic Panel: Recent Labs  Lab 02/16/19 1746 02/16/19 1746 02/16/19 1807 02/16/19 1807 02/16/19 2301 02/17/19 0006 02/17/19 0056 02/17/19 0320 02/17/19 0650 02/19/19 0256 02/23/19 0420  NA 141   < > 141  141   < > 141 142 140  --  140 139  --   K 2.6*   < > 2.6*  2.7*   < > 3.2* 3.2* 3.4*  --  3.8 3.7  --   CL 104  --  104  --   --   --  111  --  112* 110  --   CO2 24  --   --   --   --   --  21*  --  19* 23  --   GLUCOSE 190*  --  187*  --   --   --  157*  --  243* 253*  --  BUN 15  --  15  --   --   --  12  --  13 19  --   CREATININE 1.11*   < > 1.00   < >  --   --  0.80 0.81 0.79 0.87 0.86  CALCIUM 8.8*  --   --   --   --   --  8.3*  --  8.2* 8.6*  --   MG  --   --   --   --   --   --   --  1.7  --   --   --   PHOS  --   --   --   --   --   --   --  2.1*  --   --   --    < > = values in this interval not displayed.   GFR: Estimated Creatinine Clearance: 66.3 mL/min (by C-G formula based on SCr of 0.86 mg/dL). Liver Function Tests: Recent Labs  Lab 02/16/19 1746  AST 18  ALT 24  ALKPHOS 92  BILITOT 0.5  PROT 6.4*  ALBUMIN 3.4*   No results for input(s): LIPASE, AMYLASE in the last 168 hours. No results for input(s): AMMONIA in the last 168 hours. Coagulation Profile: No results for input(s): INR, PROTIME in the last 168 hours. Cardiac Enzymes: No results for input(s): CKTOTAL, CKMB, CKMBINDEX, TROPONINI in the last 168 hours. BNP (last 3 results) No results for input(s): PROBNP in the last 8760 hours. HbA1C: No results for input(s): HGBA1C in the last 72 hours. CBG: Recent Labs  Lab 02/22/19 1656 02/22/19 2059 02/22/19 2112 02/23/19 0825 02/23/19 1105  GLUCAP 277* 168* 183*  260* 235*   Lipid Profile: No results for input(s): CHOL, HDL, LDLCALC, TRIG, CHOLHDL, LDLDIRECT in the last 72 hours. Thyroid Function Tests: No results for input(s): TSH, T4TOTAL, FREET4, T3FREE, THYROIDAB in the last 72 hours. Anemia Panel: No results for input(s): VITAMINB12, FOLATE, FERRITIN, TIBC, IRON, RETICCTPCT in the last 72 hours. Sepsis Labs: Recent Labs  Lab 02/16/19 1746 02/17/19 0320  LATICACIDVEN 2.1* 0.9    Recent Results (from the past 240 hour(s))  Blood Culture (routine x 2)     Status: None   Collection Time: 02/16/19  5:45 PM   Specimen: BLOOD  Result Value Ref Range Status   Specimen Description BLOOD RIGHT ANTECUBITAL  Final   Special Requests   Final    BOTTLES DRAWN AEROBIC ONLY Blood Culture results may not be optimal due to an inadequate volume of blood received in culture bottles   Culture   Final    NO GROWTH 5 DAYS Performed at [facility], [place] [address], [place], [place] [address]    Report Status 02/21/2019 FINAL  Final  Blood Culture (routine x 2)     Status: None   Collection Time: 02/16/19  5:45 PM   Specimen: BLOOD  Result Value Ref Range Status   Specimen Description BLOOD LEFT ANTECUBITAL  Final   Special Requests   Final    BOTTLES DRAWN AEROBIC AND ANAEROBIC Blood Culture adequate volume   Culture   Final    NO GROWTH 5 DAYS Performed at [facility], [place] [address], [place], [place] [address]    Report Status 02/21/2019 FINAL  Final  Urine culture     Status: Abnormal   Collection Time: 02/16/19  7:34 PM   Specimen: In/Out Cath Urine  Result Value Ref Range Status  Specimen Description IN/OUT CATH URINE  Final   Special Requests   Final    NONE Performed at [facility], [place] [address] [place], [place] [address]    Culture MULTIPLE SPECIES PRESENT, SUGGEST RECOLLECTION (A)  Final   Report Status 02/18/2019 FINAL  Final  Respiratory Panel by RT PCR (Flu A&B, Covid) - Nasopharyngeal Swab     Status: None    Collection Time: 02/16/19  8:34 PM   Specimen: Nasopharyngeal Swab  Result Value Ref Range Status   SARS Coronavirus 2 by RT PCR NEGATIVE NEGATIVE Final    Comment: (NOTE) SARS-CoV-2 target nucleic acids are NOT DETECTED. The SARS-CoV-2 RNA is generally detectable in upper respiratoy specimens during the acute phase of infection. The lowest concentration of SARS-CoV-2 viral copies this assay can detect is 131 copies/mL. A negative result does not preclude SARS-Cov-2 infection and should not be used as the sole basis for treatment or other patient management decisions. A negative result may occur with  improper specimen collection/handling, submission of specimen other than nasopharyngeal swab, presence of viral mutation(s) within the areas targeted by this assay, and inadequate number of viral copies (<131 copies/mL). A negative result must be combined with clinical observations, patient history, and epidemiological information. The expected result is Negative. Fact Sheet for Patients:  PinkCheek.be Fact Sheet for Healthcare Providers:  GravelBags.it This test is not yet ap proved or cleared by the [place] FDA and  has been authorized for detection and/or diagnosis of SARS-CoV-2 by FDA under an Emergency Use Authorization (EUA). This EUA will remain  in effect (meaning this test can be used) for the duration of the COVID-19 declaration under Section 564(b)(1) of the Act, 21 U.S.C. section 360bbb-3(b)(1), unless the authorization is terminated or revoked sooner.    Influenza A by PCR NEGATIVE NEGATIVE Final   Influenza B by PCR NEGATIVE NEGATIVE Final    Comment: (NOTE) The Xpert Xpress SARS-CoV-2/FLU/RSV assay is intended as an aid in  the diagnosis of influenza from Nasopharyngeal swab specimens and  should not be used as a sole basis for treatment. Nasal washings and  aspirates are unacceptable for Xpert Xpress  SARS-CoV-2/FLU/RSV  testing. Fact Sheet for Patients: PinkCheek.be Fact Sheet for Healthcare Providers: GravelBags.it This test is not yet approved or cleared by the [place] FDA and  has been authorized for detection and/or diagnosis of SARS-CoV-2 by  FDA under an Emergency Use Authorization (EUA). This EUA will remain  in effect (meaning this test can be used) for the duration of the  Covid-19 declaration under Section 564(b)(1) of the Act, 21  U.S.C. section 360bbb-3(b)(1), unless the authorization is  terminated or revoked. Performed at [facility], [place] [address] [place], [place] [address]   SARS CORONAVIRUS 2 (TAT 6-24 HRS) Nasopharyngeal Nasopharyngeal Swab     Status: None   Collection Time: 02/17/19 12:50 AM   Specimen: Nasopharyngeal Swab  Result Value Ref Range Status   SARS Coronavirus 2 NEGATIVE NEGATIVE Final    Comment: (NOTE) SARS-CoV-2 target nucleic acids are NOT DETECTED. The SARS-CoV-2 RNA is generally detectable in upper and lower respiratory specimens during the acute phase of infection. Negative results do not preclude SARS-CoV-2 infection, do not rule out co-infections with other pathogens, and should not be used as the sole basis for treatment or other patient management decisions. Negative results must be combined with clinical observations, patient history, and epidemiological information. The expected result is Negative. Fact Sheet for Patients: SugarRoll.be Fact  Sheet for Healthcare Providers: https://www.woods-mathews.com/ This test is not yet approved or cleared by the [place] FDA and  has been authorized for detection and/or diagnosis of SARS-CoV-2 by FDA under an Emergency Use Authorization (EUA). This EUA will remain  in effect (meaning this test can be used) for the duration of the COVID-19 declaration under Section 56 4(b)(1) of  the Act, 21 U.S.C. section 360bbb-3(b)(1), unless the authorization is terminated or revoked sooner. Performed at [facility], [place] [address] [place] [address]   MRSA PCR Screening     Status: None   Collection Time: 02/17/19  3:25 AM   Specimen: Nasopharyngeal Swab  Result Value Ref Range Status   MRSA by PCR NEGATIVE NEGATIVE Final    Comment:        The GeneXpert MRSA Assay (FDA approved for NASAL specimens only), is one component of a comprehensive MRSA colonization surveillance program. It is not intended to diagnose MRSA infection nor to guide or monitor treatment for MRSA infections. Performed at [facility], [place] [address], [place], [place] [address]   SARS CORONAVIRUS 2 (TAT 6-24 HRS) Nasopharyngeal Nasopharyngeal Swab     Status: None   Collection Time: 02/20/19  6:50 PM   Specimen: Nasopharyngeal Swab  Result Value Ref Range Status   SARS Coronavirus 2 NEGATIVE NEGATIVE Final    Comment: (NOTE) SARS-CoV-2 target nucleic acids are NOT DETECTED. The SARS-CoV-2 RNA is generally detectable in upper and lower respiratory specimens during the acute phase of infection. Negative results do not preclude SARS-CoV-2 infection, do not rule out co-infections with other pathogens, and should not be used as the sole basis for treatment or other patient management decisions. Negative results must be combined with clinical observations, patient history, and epidemiological information. The expected result is Negative. Fact Sheet for Patients: SugarRoll.be Fact Sheet for Healthcare Providers: https://www.woods-mathews.com/ This test is not yet approved or cleared by the [place] FDA and  has been authorized for detection and/or diagnosis of SARS-CoV-2 by FDA under an Emergency Use Authorization (EUA). This EUA will remain  in effect (meaning this test can be used) for the duration of the COVID-19 declaration  under Section 56 4(b)(1) of the Act, 21 U.S.C. section 360bbb-3(b)(1), unless the authorization is terminated or revoked sooner. Performed at [facility], [place] [address], [place], [place] [address]          Radiology Studies: No results found.      Scheduled Meds: . acetaZOLAMIDE  250 mg Oral BID  . ALPRAZolam  0.25 mg Oral TID  . arformoterol  15 mcg Nebulization BID  . budesonide (PULMICORT) nebulizer solution  0.25 mg Nebulization BID  . enoxaparin (LOVENOX) injection  40 mg Subcutaneous Q24H  . furosemide  20 mg Intravenous BID  . insulin aspart  0-5 Units Subcutaneous QHS  . insulin aspart  0-9 Units Subcutaneous TID WC  . insulin aspart  9 Units Subcutaneous TID WC  . insulin glargine  40 Units Subcutaneous QHS  . methylPREDNISolone (SOLU-MEDROL) injection  40 mg Intravenous Q12H  . PARoxetine  40 mg Oral q morning - 10a  . potassium chloride  40 mEq Oral Once   Continuous Infusions:   LOS: 7 days     Georgette Shell, MD [facility]  If 7PM-7AM, please contact night-coverage [email or web address] Password [email or web address] 02/23/2019, 2:06 PM

## 2019-02-23 NOTE — Progress Notes (Signed)
Bipap is PRN order.  No distress noted at this time.  Patient has Bipap V60 still at bedside if needed.  Will continue to monitor.

## 2019-02-24 DIAGNOSIS — R0689 Other abnormalities of breathing: Secondary | ICD-10-CM | POA: Diagnosis not present

## 2019-02-24 DIAGNOSIS — J80 Acute respiratory distress syndrome: Secondary | ICD-10-CM | POA: Diagnosis not present

## 2019-02-24 DIAGNOSIS — F05 Delirium due to known physiological condition: Secondary | ICD-10-CM | POA: Diagnosis not present

## 2019-02-24 DIAGNOSIS — J189 Pneumonia, unspecified organism: Secondary | ICD-10-CM | POA: Diagnosis not present

## 2019-02-24 DIAGNOSIS — F339 Major depressive disorder, recurrent, unspecified: Secondary | ICD-10-CM | POA: Diagnosis not present

## 2019-02-24 DIAGNOSIS — I10 Essential (primary) hypertension: Secondary | ICD-10-CM | POA: Diagnosis not present

## 2019-02-24 DIAGNOSIS — R0902 Hypoxemia: Secondary | ICD-10-CM | POA: Diagnosis not present

## 2019-02-24 DIAGNOSIS — J9622 Acute and chronic respiratory failure with hypercapnia: Secondary | ICD-10-CM | POA: Diagnosis not present

## 2019-02-24 DIAGNOSIS — I5032 Chronic diastolic (congestive) heart failure: Secondary | ICD-10-CM | POA: Diagnosis not present

## 2019-02-24 DIAGNOSIS — M6281 Muscle weakness (generalized): Secondary | ICD-10-CM | POA: Diagnosis not present

## 2019-02-24 DIAGNOSIS — E1165 Type 2 diabetes mellitus with hyperglycemia: Secondary | ICD-10-CM | POA: Diagnosis not present

## 2019-02-24 DIAGNOSIS — R627 Adult failure to thrive: Secondary | ICD-10-CM | POA: Diagnosis not present

## 2019-02-24 DIAGNOSIS — Z743 Need for continuous supervision: Secondary | ICD-10-CM | POA: Diagnosis not present

## 2019-02-24 DIAGNOSIS — R279 Unspecified lack of coordination: Secondary | ICD-10-CM | POA: Diagnosis not present

## 2019-02-24 DIAGNOSIS — K219 Gastro-esophageal reflux disease without esophagitis: Secondary | ICD-10-CM | POA: Diagnosis not present

## 2019-02-24 DIAGNOSIS — R0602 Shortness of breath: Secondary | ICD-10-CM | POA: Diagnosis not present

## 2019-02-24 DIAGNOSIS — R000 Tachycardia, unspecified: Secondary | ICD-10-CM | POA: Diagnosis not present

## 2019-02-24 DIAGNOSIS — E6609 Other obesity due to excess calories: Secondary | ICD-10-CM | POA: Diagnosis not present

## 2019-02-24 DIAGNOSIS — J9610 Chronic respiratory failure, unspecified whether with hypoxia or hypercapnia: Secondary | ICD-10-CM | POA: Diagnosis not present

## 2019-02-24 DIAGNOSIS — F411 Generalized anxiety disorder: Secondary | ICD-10-CM | POA: Diagnosis not present

## 2019-02-24 DIAGNOSIS — J449 Chronic obstructive pulmonary disease, unspecified: Secondary | ICD-10-CM | POA: Diagnosis not present

## 2019-02-24 DIAGNOSIS — F419 Anxiety disorder, unspecified: Secondary | ICD-10-CM | POA: Diagnosis not present

## 2019-02-24 DIAGNOSIS — Z794 Long term (current) use of insulin: Secondary | ICD-10-CM | POA: Diagnosis not present

## 2019-02-24 DIAGNOSIS — J441 Chronic obstructive pulmonary disease with (acute) exacerbation: Secondary | ICD-10-CM | POA: Diagnosis not present

## 2019-02-24 DIAGNOSIS — J9621 Acute and chronic respiratory failure with hypoxia: Secondary | ICD-10-CM | POA: Diagnosis not present

## 2019-02-24 DIAGNOSIS — Z6834 Body mass index (BMI) 34.0-34.9, adult: Secondary | ICD-10-CM | POA: Diagnosis not present

## 2019-02-24 DIAGNOSIS — I5030 Unspecified diastolic (congestive) heart failure: Secondary | ICD-10-CM | POA: Diagnosis not present

## 2019-02-24 DIAGNOSIS — E785 Hyperlipidemia, unspecified: Secondary | ICD-10-CM | POA: Diagnosis not present

## 2019-02-24 LAB — GLUCOSE, CAPILLARY
Glucose-Capillary: 166 mg/dL — ABNORMAL HIGH (ref 70–99)
Glucose-Capillary: 100 mg/dL — ABNORMAL HIGH (ref 70–99)

## 2019-02-24 MED ORDER — ALPRAZOLAM 0.25 MG PO TABS: 0.2500 mg | ORAL_TABLET | Freq: Three times a day (TID) | ORAL | 0 refills | Status: AC

## 2019-02-24 MED ORDER — INSULIN GLARGINE 100 UNIT/ML ~~LOC~~ SOLN: 40.0000 [IU] | Freq: Every day | SUBCUTANEOUS | 11 refills | Status: AC

## 2019-02-24 MED ORDER — PREDNISONE 10 MG PO TABS: 10.0000 mg | ORAL_TABLET | Freq: Every day | ORAL | 0 refills | Status: AC

## 2019-02-24 NOTE — Consult Note (Signed)
   [facility] CM Inpatient Consult   02/24/2019  [name] Jun 11, 1945 [id number]   Referral received from inpatient Transition of Care team, Kristi.  Chart reviewed for disposition needs.  Patient with Medicare NextGen and has a past history with [facility].  Call place to the patient's phone in the hospital room, introduced this writer,  HIPAA was verified.  Spoke with patient regarding [facility] services. Spoke with patient regarding recommendations for post hospital transition of a skilled facility.  Patient states, "They are working on getting me to a rehab center can I call you back?"  Attempted to give the return phone number and the patient hung the phone up. Reach out to [place], [facility] RNCM and awaiting a return follow up.   Spoke with [facility] RNCM regarding interaction with patient when she informed this Probation officer that patient has changed her mind and is willing to go to [facility].  She has only 6 days before having to pay.  Plan: If patient goes to a [facility] affiliated facility will alert [facility] RN of care needs.  For questions, please contact:  Natividad Brood, RN BSN [facility] Liaison  [phone] business mobile phone Toll free office [phone]  Fax number: [phone] [email or web address]

## 2019-02-24 NOTE — TOC Transition Note (Signed)
Transition of Care [facility]) - CM/SW Discharge Note   Patient Details  Name: [name] MRN: [id number] Date of Birth: Dec 28, 1945  Transition of Care [facility]) CM/SW Contact:  Vinie Sill, [place] Phone Number: 02/24/2019, 1:50 PM   Clinical Narrative:     Patient will DC to: [facility] Date: 02/24/2019 Family Notified: patient's son informed of d/c today Transport By: Corey Harold  RN, patient, and facility notified of DC. Discharge Summary sent to facility. RN given number for [phone],Room [phone]. Ambulance transport requested for patient.   Clinical Social Worker signing off. Thurmond Butts, MSW, LCSWA Clinical Social Worker    Final next level of care: Skilled Nursing Facility Barriers to Discharge: Continued Medical Work up   Patient Goals and CMS Choice        Discharge Placement              Patient chooses bed at: [facility] Patient to be transferred to facility by: [facility] Name of family member notified: patient's son Patient and family notified of of transfer: 02/24/19  Discharge Plan and Services In-house Referral: Clinical Social Work                                   Social Determinants of Health (SDOH) Interventions     Readmission Risk Interventions Readmission Risk Prevention Plan 11/06/2018 11/05/2018  Transportation Screening Complete Complete  PCP or Specialist Appt within 3-5 Days Complete -  HRI or [place] Complete Complete  Social Work Consult for [place] Planning/Counseling Complete Complete  Palliative Care Screening Complete Complete  Medication Review Press photographer) Complete Complete  Some recent data might be hidden

## 2019-02-24 NOTE — Progress Notes (Signed)
Manufacturing engineer (ACC)  Notified by [facility] manager that pt is being discharged.  Pt is a part of our palliative program in the community.    ACC will follow and anticipate d/c (today per [facility] manager) and try to see as soon as possible, as she has little to no support and is refusing to return to the facility.  Venia Carbon RN, BSN, [facility] Liaison (in [place]) [phone]

## 2019-02-24 NOTE — Care Management Important Message (Signed)
Important Message  Patient Details  Name: [name] MRN: [id number] Date of Birth: November 03, 1945   Medicare Important Message Given:  Yes     Shelda Altes 02/24/2019, 11:52 AM

## 2019-02-24 NOTE — Progress Notes (Signed)
Occupational Therapy Treatment Patient Details Name: [name] MRN: [id number] DOB: 04-19-1945 Today's Date: 02/24/2019    History of present illness Pt is 74yo female with pmh significant for COPD on 3 - 6 L of oxygen, diastolic CHF, obesity, mitral valve prolapse, hypertension, hyperlipidemia, mediastinal mass and arrhythmias.  Patient presented with worsening shortness of breath and wheezing.  Pt admitted with hypoxic respiratory failure secondary to COPD exacerbation.   OT comments  Pt making gradual progress towards OT goals this session. Session focus on functional mobility to increase activity tolerance for BADL participation. Pt on 5L O2 upon OT arrival with pt expressing frustration with DC plan as pt does not want to DC to SNF. Provided education on pts current level of function and safety concerns of pt going home based on current level of activity tolerance. Pt completed 1 sit<>stand from EOB with supervision with RW and desaturated to 83% needing ~3 min seated rest break to increase O2 sats to >90%. Pt able to complete stand pivot transfer from EOB>recliner with RW with min guard assist but again desaturating to lows 80s needing seated rest break to increase O2 sats to WNL. Pt likely to continue to decline SNF, however pt does report she has w/c at home and rollator and often takes sponge baths at home. Additionally, pt have aid that comes in to assist with IADLs. Continue to recommend SNF but understand the likelihood that pt with decline placement. Will continue to follow acutely per POC.    Follow Up Recommendations  SNF    Equipment Recommendations  None recommended by OT    Recommendations for Other Services      Precautions / Restrictions Precautions Precautions: Fall Precaution Comments: watch 02 Restrictions Weight Bearing Restrictions: No       Mobility Bed Mobility Overal bed mobility: Modified Independent Bed Mobility: Supine to Sit     Supine to sit: HOB  elevated;Modified independent (Device/Increase time)     General bed mobility comments: no physical assist needed, HOB elevated with use of bed rails  Transfers Overall transfer level: Needs assistance Equipment used: Rolling walker (2 wheeled) Transfers: Sit to/from [facility] Sit to Stand: Supervision Stand pivot transfers: Min guard       General transfer comment: supervision for safety to manage lines; min guard for balance as pt de-saturated with mobility and became noticeably fatigued    Balance Overall balance assessment: Needs assistance Sitting-balance support: No upper extremity supported;Feet supported Sitting balance-Leahy Scale: Good Sitting balance - Comments: able to reach to feet to adjust socks EOB   Standing balance support: Bilateral upper extremity supported Standing balance-Leahy Scale: Poor Standing balance comment: reliant on BUE support                           ADL either performed or assessed with clinical judgement   ADL Overall ADL's : Needs assistance/impaired             Lower Body Bathing: Min guard;Sitting/lateral leans Lower Body Bathing Details (indicate cue type and reason): simulated EOB; min guard for safety when reaching to feet     Lower Body Dressing: Min guard;Sitting/lateral leans Lower Body Dressing Details (indicate cue type and reason): pt able to reach to feet EOB and reprots able to don socks at home; min guard for safety when reaching to feet Toilet Transfer: Min Insurance claims handler Details (indicate cue type and reason): simulated with RW; STP from EOB>recliner, min  guard for safety to manage lines/leads       Tub/Shower Transfer Details (indicate cue type and reason): pt reports mostly doing sponge baths at home as steam from shower makes it difficult for pt to breathe Functional mobility during ADLs: Min guard;Rolling walker General ADL Comments: pt limited by Decreased  activity tolerance and dyspnea with exertion. session focus on functional mobility to increase activity tolerance for BADL participation     Vision       Perception     Praxis      Cognition Arousal/Alertness: Awake/alert Behavior During Therapy: WFL for tasks assessed/performed Overall Cognitive Status: Impaired/Different from baseline Area of Impairment: Safety/judgement;Problem solving                         Safety/Judgement: Decreased awareness of safety;Decreased awareness of deficits   Problem Solving: Slow processing General Comments: pt with decreased safety awareness and poor insight into deficits but likely pts baseline. pt slightly agitated about DC plan as pt does not want to go to SNF and has decided to DC home.        Exercises     Shoulder Instructions       General Comments pt on 5L O2 upon  OT arrival with sats 96% HR up to as much as 120 bpm during stand pivot transfer. Pt desat to 83% after initial stand needing ~ 3 min seated rest break to increase sats back to normal limits.    Pertinent Vitals/ Pain       Pain Assessment: No/denies pain  Home Living                                          Prior Functioning/Environment              Frequency  Min 2X/week        Progress Toward Goals  OT Goals(current goals can now be found in the care plan section)  Progress towards OT goals: Progressing toward goals  Acute Rehab OT Goals Patient Stated Goal: return home OT Goal Formulation: With patient Time For Goal Achievement: 03/08/19 Potential to Achieve Goals: Good  Plan Discharge plan remains appropriate    Co-evaluation                 AM-PAC OT "6 Clicks" Daily Activity     Outcome Measure   Help from another person eating meals?: None Help from another person taking care of personal grooming?: A Little Help from another person toileting, which includes using toliet, bedpan, or urinal?: A  Lot Help from another person bathing (including washing, rinsing, drying)?: A Lot Help from another person to put on and taking off regular upper body clothing?: A Lot Help from another person to put on and taking off regular lower body clothing?: A Little 6 Click Score: 16    End of Session Equipment Utilized During Treatment: Rolling walker;Oxygen;Other (comment)(5L)  OT Visit Diagnosis: Muscle weakness (generalized) (M62.81);Unsteadiness on feet (R26.81)   Activity Tolerance Other (comment)(limited by decreased activity tolerance and dyspnea with exertion)   Patient Left in chair;with call bell/phone within reach;with chair alarm set   Nurse Communication Mobility status;Other (comment)(gave graham crackers)        Time: [id number] OT Time Calculation (min): 24 min  Charges: OT General Charges $OT Visit: 1 Visit OT Treatments $Self Care/Home Management :  8-22 mins $Therapeutic Activity: 8-22 mins  Lanier Clam., COTA/L Acute Rehabilitation Services [phone] [place] 02/24/2019, 12:52 PM

## 2019-02-24 NOTE — Progress Notes (Signed)
Called report in to [place] at [facility].    Unit manager aware of patient from previous admission.

## 2019-02-24 NOTE — TOC Progression Note (Signed)
Transition of Care [facility]) - Progression Note    Patient Details  Name: Audrieanna Worthing MRN: [id number] Date of Birth: January 02, 1946  Transition of Care [facility]) CM/SW [place], [place] Phone Number: 02/24/2019, 10:20 AM  Clinical Narrative:     CSW visit the patient at bedside. Today, patient is refusing to go to [facility] CSW advised going home was not a safe plan because of limited support in the home, as also expressed by the medical team. Patient has a history of going back and forward regarding going to rehab, however,today  patient strongly reiterated  she wants to go home.   CSW informed MD patient refused SNF and requested to discharge home.   Thurmond Butts, MSW, LCSWA Clinical Social Worker     Barriers to Discharge: Continued Medical Work up  Expected Discharge Plan and Services   In-house Referral: Clinical Social Work     Living arrangements for the past 2 months: Single Family Home Expected Discharge Date: 02/24/19                                     Social Determinants of Health (SDOH) Interventions    Readmission Risk Interventions Readmission Risk Prevention Plan 11/06/2018 11/05/2018  Transportation Screening Complete Complete  PCP or Specialist Appt within 3-5 Days Complete -  HRI or [place] Complete Complete  Social Work Consult for [place] Planning/Counseling Complete Complete  Palliative Care Screening Complete Complete  Medication Review Press photographer) Complete Complete  Some recent data might be hidden

## 2019-02-24 NOTE — Progress Notes (Signed)
IV discontinued.  V/S taken.  EMS transfer to [facility].

## 2019-02-24 NOTE — Plan of Care (Signed)
  Problem: Education: Goal: Knowledge of General Education information will improve Description: Including pain rating scale, medication(s)/side effects and non-pharmacologic comfort measures Outcome: Adequate for Discharge   Problem: Health Behavior/Discharge Planning: Goal: Ability to manage health-related needs will improve Outcome: Adequate for Discharge   Problem: Clinical Measurements: Goal: Ability to maintain clinical measurements within normal limits will improve Outcome: Adequate for Discharge Goal: Will remain free from infection Outcome: Adequate for Discharge Goal: Diagnostic test results will improve Outcome: Adequate for Discharge Goal: Respiratory complications will improve Outcome: Adequate for Discharge Goal: Cardiovascular complication will be avoided Outcome: Adequate for Discharge   Problem: Activity: Goal: Risk for activity intolerance will decrease Outcome: Adequate for Discharge   Problem: Nutrition: Goal: Adequate nutrition will be maintained Outcome: Adequate for Discharge   Problem: Coping: Goal: Level of anxiety will decrease Outcome: Adequate for Discharge   Problem: Elimination: Goal: Will not experience complications related to bowel motility Outcome: Adequate for Discharge Goal: Will not experience complications related to urinary retention Outcome: Adequate for Discharge   Problem: Pain Managment: Goal: General experience of comfort will improve Outcome: Adequate for Discharge   Problem: Safety: Goal: Ability to remain free from injury will improve Outcome: Adequate for Discharge   Problem: Skin Integrity: Goal: Risk for impaired skin integrity will decrease Outcome: Adequate for Discharge   Problem: Acute Rehab PT Goals(only PT should resolve) Goal: Pt Will Go Supine/Side To Sit Outcome: Adequate for Discharge Goal: Pt Will Go Sit To Supine/Side Outcome: Adequate for Discharge Goal: Patient Will Transfer Sit To/From  Stand Outcome: Adequate for Discharge Goal: Pt Will Transfer Bed To Chair/Chair To Bed Outcome: Adequate for Discharge Goal: Pt Will Ambulate Outcome: Adequate for Discharge Goal: Pt Will Go Up/Down Stairs Outcome: Adequate for Discharge   Problem: Acute Rehab OT Goals (only OT should resolve) Goal: Pt. Will Perform Lower Body Bathing Outcome: Adequate for Discharge Goal: Pt. Will Perform Lower Body Dressing Outcome: Adequate for Discharge Goal: Pt. Will Transfer To Toilet Outcome: Adequate for Discharge Goal: Pt. Will Perform Toileting-Clothing Manipulation Outcome: Adequate for Discharge

## 2019-02-24 NOTE — TOC Transition Note (Signed)
Transition of Care [facility]) - CM/SW Discharge Note Toni Gibbons RN, BSN Transitions of Care Unit 4E- RN Case Manager [phone]   Patient Details  Name: [name] MRN: [id number] Date of Birth: December 15, 1945  Transition of Care [facility]) CM/SW Contact:  Dawayne Patricia, RN Phone Number: 02/24/2019, 2:24 PM   Clinical Narrative:    Pt stable for transition today- plan was for pt to return to SNF, however this am pt stated she did not want to return to SNF and wanted to return home- Arp orders were placed- CM spoke with pt at bedside to confirm and pt reported that she wanted to go home- choice was offered for [facility] agency- Per CMS guidelines from medicare.gov website with star ratings (copy placed in shadow chart)- per pt she wants to use [facility] for [facility] services- is active with [facility]- agreeable to [facility] referral. Call made to [facility] with [facility] to notify of pt's choice to return home- they will make home visit tomorrow. Call made to [facility] with [facility] for [facility] referral- referral accepted -without OT- pt was asking about her Medicare SNF days- which were provided to pt - at 1300- pt informed this CM that she now wants to return to [facility]- explained to pt that we needed to make a final decision on her transition plan, what the safest recommendation was for her and let her know I would have to check to see if her bed was still available at [facility] since she had said this morning she wanted to go home.  1350- CSW and CM spoke with pt at bedside to let her know SNF bed still available and [facility] services have been set up- pt needs to make final choice as to what she wants to do as she has been discharged today- pt states she wants to go to rehab and wants to go back to [facility] - calls were made to [facility] and [facility] to update on the plan to return to SNF. [facility] [place] [facility].    Final next level of care: Skilled Nursing Facility Barriers to Discharge: No Barriers  Identified, Barriers Resolved   Patient Goals and CMS Choice Patient states their goals for this hospitalization and ongoing recovery are:: go back to rehab CMS Medicare.gov Compare Post Acute Care list provided to:: Patient Choice offered to / list presented to : Patient  Discharge Placement              Patient chooses bed at: [facility] Patient to be transferred to facility by: [place] Name of family member notified: patient's son Patient and family notified of of transfer: 02/24/19  Discharge Plan and Services In-house Referral: Clinical Social Work Discharge Planning Services: [employer] Consult Post Acute Care Choice: Home Health          DME Arranged: N/A DME Agency: NA       HH Arranged: RN, PT, Nurse's Aide, Respirator Therapy, Social Work [employer] Agency: [place] ([place]), [place] Date [place]: 02/24/19 Time [place]: 1200 Representative spoke with at [place]: [place] ([place]) Interventions     Readmission Risk Interventions Readmission Risk Prevention Plan 02/24/2019 11/06/2018 11/05/2018  Transportation Screening Complete Complete Complete  PCP or Specialist Appt within 3-5 Days Complete Complete -  HRI or [place] - Complete Complete  Social Work Consult for [place] Planning/Counseling Complete Complete Complete  Palliative Care  Screening Complete Complete Complete  Medication Review (RN Care Manager) Complete Complete Complete  Some recent data might be hidden

## 2019-02-24 NOTE — Discharge Summary (Addendum)
Physician Discharge Summary  [name] [id number] DOB: 1945/02/27 DOA: 02/16/2019  PCP: Toni Rail, MD  Admit date: 02/16/2019 Discharge date: 02/24/2019  Admitted From:[facility] Disposition: [facility] Recommendations for Outpatient Follow-up:  1. Follow up with PCP in 1-2 weeks 2. Please obtain BMP/CBC in one week 3    Please have palliative care follow up at the facility  Home Health:none Equipment/Devices: None Discharge Condition:  improved CODE STATUS: DO NOT RESUSCITATE Diet recommendation: Cardiac diet Brief/Interim Summary:  74 year old female past medical history significant for COPD on 3- 6L of oxygen according to the Dr.Yao,diastolic CHF, obesity, mitral valve prolapse, hypertension, hyperlipidemia, mediastinal mass and arrhythmias. Apparently, patient presented with worsening shortness of breath and wheezing. ER provider as the hospitalist team to admit patient. Patient is in significant respiratory distress and, therefore, cannot give any history. Pertinent labs revealed potassium of 2.6, cardiac BNP of 61.2, UA done revealed moderate hemoglobin and leukocyte with specific gravity of 1.013. Leukocytosis of 12.7 is noted. Chest x-ray revealed "Mild right basilar and moderate severity left basilar atelectasis and/or infiltrate. Focal opacification within the lateral aspect of the left lung base which is more prominent on the current study and may represent a more focal area of consolidation. An area of loculated pleural fluid cannot be excluded.Stable cardiomegaly".CT angio chest revealed "Moderate severity left lower lobe atelectasis and/or infiltrate with mild left-sided volume loss. Multiple stable subcentimeter right upper lobe and right middle lobe noncalcified lung nodules".  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  Patient was treated with IV antibiotics IV steroids and BiPAP.  However she continues to have intermittent episodes of hypoxia which gets better with BiPAP.  She has a very poor prognosis with end-stage COPD and very diminished lung reserve.  This was discussed with patient and her son [name].  Patient was seen by palliative care.  Her CODE STATUS was changed to DO NOT RESUSCITATE.  She would benefit from palliative care follow-up at the facility with ongoing encouragement to the convert to hospice care.  There is nothing more that can be done medically since her lung reserve is very poor. We had arranged rehab at [facility] and had discharged her to [facility].  She refused to go to any rehab but to her home.  She knows she is to be deconditioned gets hypoxic easily and she knows she could die in her house alone.  I discussed with her and her son [name].  Her son tells me that he wants to do what ever his mother wants to do.  He is aware the patient is going home alone.  Patient is aware that she can die any time at home alone.  She refused hospice care.  She refused palliative care.  She is agreeable with home health.  She also reported if she gets hypoxic she will just come back to the hospital.  She is a DNR. Per her wishes and her family's wishes she will be discharged home although this is not safe for her.  This was discussed multiple times with the patient and her family her son [name].  [name] lives in [place] and another son lives in [place] she does have a son in [place] which she does not get along with.  CT of the chestleft lower lobe atelectasis versus infiltrates with mild left-sided volume loss, multiple  stable subcentimeter right upper lobe right middle lobe noncalcified lung nodules. Chest x-rayright basilar opacities remain improved. X-ray from 02/19/2019. Chest x-ray done 02/21/2019 stable left base opacification likely small to moderate effusion with associated atelectasis small amount right pleural fluid stable cardiomegaly with suggestion of minimal vascular congestion.  #2 diastolic heart failure-stable continue Lasix and Diamox. Chest x-rayas above.  Continue diuretics.  #3 anxiety continue Xanax.  #4 type 2 diabetes-continue Lantus.  She might need additional insulin while she is on steroids.  #5 depression continue Paxil.  Pressure Injury 08/30/18 Buttocks Right;Medial Stage II -  Partial thickness loss of dermis presenting as a shallow open ulcer with a red, pink wound bed without slough. (Active)  08/30/18 2134  Location: Buttocks  Location Orientation: Right;Medial  Staging: Stage II -  Partial thickness loss of dermis presenting as a shallow open ulcer with a red, pink wound bed without slough.  Wound Description (Comments):   Present on Admission:      Estimated body mass index is 34.78 kg/m as calculated from the following:   Height as of this encounter: 5\' 4"  (1.626 m).   Weight as of this encounter: 91.9 kg.  Discharge Instructions  Discharge Instructions    Call MD for:  difficulty breathing, headache or visual disturbances   Complete by: As directed    Call MD for:  persistant nausea and vomiting   Complete by: As directed    Call MD for:  severe uncontrolled pain   Complete by: As directed    Call MD for:  temperature >100.4   Complete by: As directed    Diet - low sodium heart healthy   Complete by: As directed    Increase activity slowly   Complete by: As directed      Allergies as of 02/24/2019      Reactions   Ambien [zolpidem Tartrate] Other (See Comments)   Causes nightmares      Medication List    STOP taking these medications    acetaminophen 325 MG tablet Commonly known as: TYLENOL     TAKE these medications   acetaZOLAMIDE 250 MG tablet Commonly known as: DIAMOX Take 1 tablet (250 mg total) by mouth 2 (two) times daily.   albuterol 108 (90 Base) MCG/ACT inhaler Commonly known as: VENTOLIN HFA Inhale 2 puffs into the lungs every 6 (six) hours as needed for wheezing or shortness of breath.   ALPRAZolam 0.25 MG tablet Commonly known as: XANAX Take 1 tablet (0.25 mg total) by mouth 3 (three) times daily.   arformoterol 15 MCG/2ML Nebu Commonly known as: BROVANA Take 2 mLs (15 mcg total) by nebulization 2 (two) times daily.   budesonide 0.25 MG/2ML nebulizer solution Commonly known as: PULMICORT Take 2 mLs (0.25 mg total) by nebulization 2 (two) times daily.   fluticasone 50 MCG/ACT nasal spray Commonly known as: FLONASE SPRAY 2 SPRAYS INTO EACH NOSTRIL EVERY DAY What changed: See the new instructions.   furosemide 40 MG tablet Commonly known as: LASIX Take 40 mg by mouth daily.   GlucoCom Lancets 33G Misc Use with Test Strips to take blood sugars   guaiFENesin 600 MG 12 hr tablet Commonly known as: Mucinex Take 1 tablet (600 mg total) by mouth 2 (two) times  daily.   insulin aspart 100 UNIT/ML injection Commonly known as: novoLOG Inject 3 Units into the skin 3 (three) times daily with meals.   insulin glargine 100 UNIT/ML injection Commonly known as: LANTUS Inject 0.4 mLs (40 Units total) into the skin at bedtime. What changed:   how much to take  Another medication with the same name was removed. Continue taking this medication, and follow the directions you see here.   Insulin Pen Needle 32G X 4 MM Misc Commonly known as: BD Pen Needle Nano U/F USE TO ADMINISTER LANTUS INSULIN   iron polysaccharides 150 MG capsule Commonly known as: NIFEREX Take 1 capsule (150 mg total) by mouth daily.   pantoprazole 40 MG tablet Commonly known as: PROTONIX Take 1 tablet (40 mg total) by mouth  daily.   PARoxetine 40 MG tablet Commonly known as: PAXIL Take 1 tablet (40 mg total) by mouth every morning. What changed: when to take this   predniSONE 10 MG tablet Commonly known as: DELTASONE Take 1 tablet (10 mg total) by mouth daily. What changed:   medication strength  See the new instructions.  Another medication with the same name was removed. Continue taking this medication, and follow the directions you see here.   Trelegy Ellipta 100-62.5-25 MCG/INH Aepb Generic drug: Fluticasone-Umeclidin-Vilant Inhale 1 puff into the lungs daily.   umeclidinium bromide 62.5 MCG/INH Aepb Commonly known as: INCRUSE ELLIPTA Inhale 1 puff into the lungs daily.      Follow-up Information    Toni Rail, MD Follow up.   Specialty: Internal Medicine Contact information: [place] [address] [phone]          Allergies  Allergen Reactions  . Ambien [Zolpidem Tartrate] Other (See Comments)    Causes nightmares    Consultations: Palliative care Procedures/Studies: DG Chest 1 View  Result Date: 02/21/2019 CLINICAL DATA:  Hypoxemia. EXAM: CHEST  1 VIEW COMPARISON:  02/19/2019 FINDINGS: Lordotic technique is demonstrated. Stable left base opacification likely small to moderate effusion with associated atelectasis. Likely small amount of right pleural fluid. Stable hazy opacification of the infrahilar vessels suggesting mild vascular congestion. Stable mild cardiomegaly. Remainder of the exam is unchanged. IMPRESSION: 1. Stable left base opacification likely small to moderate effusion with associated atelectasis. Small amount right pleural fluid. 2. Stable cardiomegaly with suggestion of minimal vascular congestion. Electronically Signed   By: Marin Olp M.D.   On: 02/21/2019 10:02   DG Chest 1 View  Result Date: 02/19/2019 CLINICAL DATA:  Shortness of breath.  History of COPD. EXAM: CHEST  1 VIEW COMPARISON:  February 16, 2019 FINDINGS: No  pneumothorax. Stable cardiomegaly. The hila and mediastinum are normal. There may be continued opacity in left retrocardiac region, less pronounced in the interval. Mild increased interstitial markings in the right base are mildly improved in the interval. No other acute interval changes. IMPRESSION: Bibasilar opacities remain but appear improved in the interval. No other acute abnormalities. Electronically Signed   By: Dorise Bullion III M.D   On: 02/19/2019 15:11   CT Angio Chest PE W and/or Wo Contrast  Result Date: 02/16/2019 CLINICAL DATA:  Shortness of breath. EXAM: CT ANGIOGRAPHY CHEST WITH CONTRAST TECHNIQUE: Multidetector CT imaging of the chest was performed using the standard protocol during bolus administration of intravenous contrast. Multiplanar CT image reconstructions and MIPs were obtained to evaluate the vascular anatomy. CONTRAST:  39mL OMNIPAQUE IOHEXOL 350 MG/ML SOLN COMPARISON:  January 21, 2019 FINDINGS: Cardiovascular: There is moderate severity  calcification of the thoracic aorta. Marked severity tortuosity of the descending thoracic aorta is seen. Multiple areas of artifact are seen involving the subsegmental pulmonary arteries. No intraluminal filling defects are clearly identified. Normal heart size. No pericardial effusion. Mediastinum/Nodes: There is mild bilateral hilar lymphadenopathy. Mild lymphadenopathy is also seen within the region of the AP window. Lungs/Pleura: A stable 7 mm calcified lung nodule is seen within the anterolateral aspect of the right upper lobe (axial CT image 68, CT series number 7). An additional stable 4 mm anterolateral right upper lobe noncalcified lung nodule is seen (axial CT image 36, CT series number 7). A stable 6 mm noncalcified lung nodule is seen within the anterolateral aspect of the right middle lobe (axial CT image 84, CT series number 7). Moderate severity atelectasis and/or infiltrate is seen within the left lower lobe with mild left-sided  volume loss. There is no evidence of a pleural effusion or pneumothorax. Upper Abdomen: No acute abnormality. Musculoskeletal: Multilevel degenerative changes seen throughout the thoracic spine. Review of the MIP images confirms the above findings. IMPRESSION: 1. Moderate severity left lower lobe atelectasis and/or infiltrate with mild left-sided volume loss. 2. Multiple stable subcentimeter right upper lobe and right middle lobe noncalcified lung nodules. Electronically Signed   By: Virgina Norfolk M.D.   On: 02/16/2019 22:51   DG Chest Port 1 View  Result Date: 02/16/2019 CLINICAL DATA:  Shortness of breath. EXAM: PORTABLE CHEST 1 VIEW COMPARISON:  January 24, 2018 FINDINGS: Mild, chronic appearing increased interstitial lung markings are noted bilaterally. Mild atelectasis and/or infiltrate is seen within the right lung base with mild to moderate severity left basilar atelectasis and/or infiltrate noted. A focal area of opacification is seen along the lateral aspect of the left lung base. There is a small left pleural effusion. No pneumothorax is seen. The cardiac silhouette is markedly enlarged. Degenerative changes seen throughout the thoracic spine. IMPRESSION: 1. Mild right basilar and moderate severity left basilar atelectasis and/or infiltrate. 2. Focal opacification within the lateral aspect of the left lung base which is more prominent on the current study and may represent a more focal area of consolidation. An area of loculated pleural fluid cannot be excluded. 3. Stable cardiomegaly. Electronically Signed   By: Virgina Norfolk M.D.   On: 02/16/2019 18:04    (Echo, Carotid, EGD, Colonoscopy, ERCP)    Subjective: Resting in bed appears no distress  Discharge Exam: Vitals:   02/24/19 0723 02/24/19 0725  BP:    Pulse:    Resp:    Temp:    SpO2: 99% 98%   Vitals:   02/24/19 0029 02/24/19 0455 02/24/19 0723 02/24/19 0725  BP: (!) 103/59 105/63    Pulse: 69 60    Resp: 19 13     Temp: 98.3 F (36.8 C) 98.3 F (36.8 C)    TempSrc: Oral Oral    SpO2: 97% 98% 99% 98%  Weight:  91.9 kg    Height:        General: Pt is alert, awake, not in acute distress Cardiovascular: RRR, S1/S2 +, no rubs, no gallops Respiratory: Scattered wheezing bilaterally, no wheezing, no rhonchi Abdominal: Soft, NT, ND, bowel sounds + Extremities: no edema, no cyanosis    The results of significant diagnostics from this hospitalization (including imaging, microbiology, ancillary and laboratory) are listed below for reference.     Microbiology: Recent Results (from the past 240 hour(s))  Blood Culture (routine x 2)     Status: None  Collection Time: 02/16/19  5:45 PM   Specimen: BLOOD  Result Value Ref Range Status   Specimen Description BLOOD RIGHT ANTECUBITAL  Final   Special Requests   Final    BOTTLES DRAWN AEROBIC ONLY Blood Culture results may not be optimal due to an inadequate volume of blood received in culture bottles   Culture   Final    NO GROWTH 5 DAYS Performed at [facility], [place] [address] [place], [place] [address]    Report Status 02/21/2019 FINAL  Final  Blood Culture (routine x 2)     Status: None   Collection Time: 02/16/19  5:45 PM   Specimen: BLOOD  Result Value Ref Range Status   Specimen Description BLOOD LEFT ANTECUBITAL  Final   Special Requests   Final    BOTTLES DRAWN AEROBIC AND ANAEROBIC Blood Culture adequate volume   Culture   Final    NO GROWTH 5 DAYS Performed at [facility], [place] [address], [place], [place] [address]    Report Status 02/21/2019 FINAL  Final  Urine culture     Status: Abnormal   Collection Time: 02/16/19  7:34 PM   Specimen: In/Out Cath Urine  Result Value Ref Range Status   Specimen Description IN/OUT CATH URINE  Final   Special Requests   Final    NONE Performed at [facility], [place] [address] [place], [place] [address]    Culture MULTIPLE SPECIES PRESENT, SUGGEST RECOLLECTION (A)  Final    Report Status 02/18/2019 FINAL  Final  Respiratory Panel by RT PCR (Flu A&B, Covid) - Nasopharyngeal Swab     Status: None   Collection Time: 02/16/19  8:34 PM   Specimen: Nasopharyngeal Swab  Result Value Ref Range Status   SARS Coronavirus 2 by RT PCR NEGATIVE NEGATIVE Final    Comment: (NOTE) SARS-CoV-2 target nucleic acids are NOT DETECTED. The SARS-CoV-2 RNA is generally detectable in upper respiratoy specimens during the acute phase of infection. The lowest concentration of SARS-CoV-2 viral copies this assay can detect is 131 copies/mL. A negative result does not preclude SARS-Cov-2 infection and should not be used as the sole basis for treatment or other patient management decisions. A negative result may occur with  improper specimen collection/handling, submission of specimen other than nasopharyngeal swab, presence of viral mutation(s) within the areas targeted by this assay, and inadequate number of viral copies (<131 copies/mL). A negative result must be combined with clinical observations, patient history, and epidemiological information. The expected result is Negative. Fact Sheet for Patients:  PinkCheek.be Fact Sheet for Healthcare Providers:  GravelBags.it This test is not yet ap proved or cleared by the [place] FDA and  has been authorized for detection and/or diagnosis of SARS-CoV-2 by FDA under an Emergency Use Authorization (EUA). This EUA will remain  in effect (meaning this test can be used) for the duration of the COVID-19 declaration under Section 564(b)(1) of the Act, 21 U.S.C. section 360bbb-3(b)(1), unless the authorization is terminated or revoked sooner.    Influenza A by PCR NEGATIVE NEGATIVE Final   Influenza B by PCR NEGATIVE NEGATIVE Final    Comment: (NOTE) The Xpert Xpress SARS-CoV-2/FLU/RSV assay is intended as an aid in  the diagnosis of influenza from Nasopharyngeal swab  specimens and  should not be used as a sole basis for treatment. Nasal washings and  aspirates are unacceptable for Xpert Xpress SARS-CoV-2/FLU/RSV  testing. Fact Sheet for Patients: PinkCheek.be Fact Sheet for Healthcare  Providers: GravelBags.it This test is not yet approved or cleared by the [place] and  has been authorized for detection and/or diagnosis of SARS-CoV-2 by  FDA under an Emergency Use Authorization (EUA). This EUA will remain  in effect (meaning this test can be used) for the duration of the  Covid-19 declaration under Section 564(b)(1) of the Act, 21  U.S.C. section 360bbb-3(b)(1), unless the authorization is  terminated or revoked. Performed at [facility], [place] [address] [place], [place] [address]   SARS CORONAVIRUS 2 (TAT 6-24 HRS) Nasopharyngeal Nasopharyngeal Swab     Status: None   Collection Time: 02/17/19 12:50 AM   Specimen: Nasopharyngeal Swab  Result Value Ref Range Status   SARS Coronavirus 2 NEGATIVE NEGATIVE Final    Comment: (NOTE) SARS-CoV-2 target nucleic acids are NOT DETECTED. The SARS-CoV-2 RNA is generally detectable in upper and lower respiratory specimens during the acute phase of infection. Negative results do not preclude SARS-CoV-2 infection, do not rule out co-infections with other pathogens, and should not be used as the sole basis for treatment or other patient management decisions. Negative results must be combined with clinical observations, patient history, and epidemiological information. The expected result is Negative. Fact Sheet for Patients: SugarRoll.be Fact Sheet for Healthcare Providers: https://www.woods-Javonnie [place].com/ This test is not yet approved or cleared by the [place] FDA and  has been authorized for detection and/or diagnosis of SARS-CoV-2 by FDA under an Emergency Use Authorization (EUA). This  EUA will remain  in effect (meaning this test can be used) for the duration of the COVID-19 declaration under Section 56 4(b)(1) of the Act, 21 U.S.C. section 360bbb-3(b)(1), unless the authorization is terminated or revoked sooner. Performed at [facility], [place] [address], [place], [place] [address]   MRSA PCR Screening     Status: None   Collection Time: 02/17/19  3:25 AM   Specimen: Nasopharyngeal Swab  Result Value Ref Range Status   MRSA by PCR NEGATIVE NEGATIVE Final    Comment:        The GeneXpert MRSA Assay (FDA approved for NASAL specimens only), is one component of a comprehensive MRSA colonization surveillance program. It is not intended to diagnose MRSA infection nor to guide or monitor treatment for MRSA infections. Performed at [facility], [place] [address] [place], [place] [address]   SARS CORONAVIRUS 2 (TAT 6-24 HRS) Nasopharyngeal Nasopharyngeal Swab     Status: None   Collection Time: 02/20/19  6:50 PM   Specimen: Nasopharyngeal Swab  Result Value Ref Range Status   SARS Coronavirus 2 NEGATIVE NEGATIVE Final    Comment: (NOTE) SARS-CoV-2 target nucleic acids are NOT DETECTED. The SARS-CoV-2 RNA is generally detectable in upper and lower respiratory specimens during the acute phase of infection. Negative results do not preclude SARS-CoV-2 infection, do not rule out co-infections with other pathogens, and should not be used as the sole basis for treatment or other patient management decisions. Negative results must be combined with clinical observations, patient history, and epidemiological information. The expected result is Negative. Fact Sheet for Patients: SugarRoll.be Fact Sheet for Healthcare Providers: https://www.woods-Lir[place]ona [place].com/ This test is not yet approved or cleared by the [place] FDA and  has been authorized for detection and/or diagnosis of SARS-CoV-2 by FDA under an Emergency Use  Authorization (EUA). This EUA will remain  in effect (meaning this test can be used) for the duration of the COVID-19 declaration under Section 56 4(b)(1) of the Act, 21  U.S.C. section 360bbb-3(b)(1), unless the authorization is terminated or revoked sooner. Performed at [facility], [place] [address], [place], [place] [address]   SARS CORONAVIRUS 2 (TAT 6-24 HRS) Nasopharyngeal Nasopharyngeal Swab     Status: None   Collection Time: 02/23/19  2:20 PM   Specimen: Nasopharyngeal Swab  Result Value Ref Range Status   SARS Coronavirus 2 NEGATIVE NEGATIVE Final    Comment: (NOTE) SARS-CoV-2 target nucleic acids are NOT DETECTED. The SARS-CoV-2 RNA is generally detectable in upper and lower respiratory specimens during the acute phase of infection. Negative results do not preclude SARS-CoV-2 infection, do not rule out co-infections with other pathogens, and should not be used as the sole basis for treatment or other patient management decisions. Negative results must be combined with clinical observations, patient history, and epidemiological information. The expected result is Negative. Fact Sheet for Patients: SugarRoll.be Fact Sheet for Healthcare Providers: https://www.woods-[place]ena [place].com/ This test is not yet approved or cleared by the [place] FDA and  has been authorized for detection and/or diagnosis of SARS-CoV-2 by FDA under an Emergency Use Authorization (EUA). This EUA will remain  in effect (meaning this test can be used) for the duration of the COVID-19 declaration under Section 56 4(b)(1) of the Act, 21 U.S.C. section 360bbb-3(b)(1), unless the authorization is terminated or revoked sooner. Performed at [facility], [place] [address], [place], [place] [address]      Labs: BNP (last 3 results) Recent Labs    11/02/18 1729 01/20/19 1100 02/16/19 1833  BNP 72.3 106.9* 0000000   Basic Metabolic Panel: Recent Labs   Lab 02/19/19 0256 02/23/19 0420  NA 139  --   K 3.7  --   CL 110  --   CO2 23  --   GLUCOSE 253*  --   BUN 19  --   CREATININE 0.87 0.86  CALCIUM 8.6*  --    Liver Function Tests: No results for input(s): AST, ALT, ALKPHOS, BILITOT, PROT, ALBUMIN in the last 168 hours. No results for input(s): LIPASE, AMYLASE in the last 168 hours. No results for input(s): AMMONIA in the last 168 hours. CBC: Recent Labs  Lab 02/19/19 0256  WBC 15.8*  NEUTROABS 14.4*  HGB 9.2*  HCT 33.7*  MCV 75.2*  PLT 263   Cardiac Enzymes: No results for input(s): CKTOTAL, CKMB, CKMBINDEX, TROPONINI in the last 168 hours. BNP: Invalid input(s): POCBNP CBG: Recent Labs  Lab 02/23/19 0825 02/23/19 1105 02/23/19 1603 02/23/19 2132 02/24/19 0540  GLUCAP 260* 235* 205* 215* 166*   D-Dimer No results for input(s): DDIMER in the last 72 hours. Hgb A1c No results for input(s): HGBA1C in the last 72 hours. Lipid Profile No results for input(s): CHOL, HDL, LDLCALC, TRIG, CHOLHDL, LDLDIRECT in the last 72 hours. Thyroid function studies No results for input(s): TSH, T4TOTAL, T3FREE, THYROIDAB in the last 72 hours.  Invalid input(s): FREET3 Anemia work up No results for input(s): VITAMINB12, FOLATE, FERRITIN, TIBC, IRON, RETICCTPCT in the last 72 hours. Urinalysis    Component Value Date/Time   COLORURINE YELLOW 02/16/2019 1944   APPEARANCEUR HAZY (A) 02/16/2019 1944   LABSPEC 1.013 02/16/2019 1944   PHURINE 6.0 02/16/2019 1944   GLUCOSEU NEGATIVE 02/16/2019 1944   GLUCOSEU NEGATIVE 04/09/2006 1315   HGBUR MODERATE (A) 02/16/2019 1944   BILIRUBINUR NEGATIVE 02/16/2019 1944   KETONESUR 5 (A) 02/16/2019 1944   PROTEINUR NEGATIVE 02/16/2019 1944   UROBILINOGEN 1.0 04/04/2010 2323   NITRITE NEGATIVE 02/16/2019 1944  LEUKOCYTESUR MODERATE (A) 02/16/2019 1944   Sepsis Labs Invalid input(s): PROCALCITONIN,  WBC,  LACTICIDVEN Microbiology Recent Results (from the past 240 hour(s))  Blood  Culture (routine x 2)     Status: None   Collection Time: 02/16/19  5:45 PM   Specimen: BLOOD  Result Value Ref Range Status   Specimen Description BLOOD RIGHT ANTECUBITAL  Final   Special Requests   Final    BOTTLES DRAWN AEROBIC ONLY Blood Culture results may not be optimal due to an inadequate volume of blood received in culture bottles   Culture   Final    NO GROWTH 5 DAYS Performed at [facility], [place] [address] [place], [place] [address]    Report Status 02/21/2019 FINAL  Final  Blood Culture (routine x 2)     Status: None   Collection Time: 02/16/19  5:45 PM   Specimen: BLOOD  Result Value Ref Range Status   Specimen Description BLOOD LEFT ANTECUBITAL  Final   Special Requests   Final    BOTTLES DRAWN AEROBIC AND ANAEROBIC Blood Culture adequate volume   Culture   Final    NO GROWTH 5 DAYS Performed at [facility], [place] [address] [place], [place] [address]    Report Status 02/21/2019 FINAL  Final  Urine culture     Status: Abnormal   Collection Time: 02/16/19  7:34 PM   Specimen: In/Out Cath Urine  Result Value Ref Range Status   Specimen Description IN/OUT CATH URINE  Final   Special Requests   Final    NONE Performed at [facility], [place] [address], [place], [place] [address]    Culture MULTIPLE SPECIES PRESENT, SUGGEST RECOLLECTION (A)  Final   Report Status 02/18/2019 FINAL  Final  Respiratory Panel by RT PCR (Flu A&B, Covid) - Nasopharyngeal Swab     Status: None   Collection Time: 02/16/19  8:34 PM   Specimen: Nasopharyngeal Swab  Result Value Ref Range Status   SARS Coronavirus 2 by RT PCR NEGATIVE NEGATIVE Final    Comment: (NOTE) SARS-CoV-2 target nucleic acids are NOT DETECTED. The SARS-CoV-2 RNA is generally detectable in upper respiratoy specimens during the acute phase of infection. The lowest concentration of SARS-CoV-2 viral copies this assay can detect is 131 copies/mL. A negative result does not preclude SARS-Cov-2 infection  and should not be used as the sole basis for treatment or other patient management decisions. A negative result may occur with  improper specimen collection/handling, submission of specimen other than nasopharyngeal swab, presence of viral mutation(s) within the areas targeted by this assay, and inadequate number of viral copies (<131 copies/mL). A negative result must be combined with clinical observations, patient history, and epidemiological information. The expected result is Negative. Fact Sheet for Patients:  PinkCheek.be Fact Sheet for Healthcare Providers:  GravelBags.it This test is not yet ap proved or cleared by the [place] FDA and  has been authorized for detection and/or diagnosis of SARS-CoV-2 by FDA under an Emergency Use Authorization (EUA). This EUA will remain  in effect (meaning this test can be used) for the duration of the COVID-19 declaration under Section 564(b)(1) of the Act, 21 U.S.C. section 360bbb-3(b)(1), unless the authorization is terminated or revoked sooner.    Influenza A by PCR NEGATIVE NEGATIVE Final   Influenza B by PCR NEGATIVE NEGATIVE Final    Comment: (NOTE) The Xpert Xpress SARS-CoV-2/FLU/RSV assay is intended as an aid in  the diagnosis of influenza  from Nasopharyngeal swab specimens and  should not be used as a sole basis for treatment. Nasal washings and  aspirates are unacceptable for Xpert Xpress SARS-CoV-2/FLU/RSV  testing. Fact Sheet for Patients: PinkCheek.be Fact Sheet for Healthcare Providers: GravelBags.it This test is not yet approved or cleared by the [place] FDA and  has been authorized for detection and/or diagnosis of SARS-CoV-2 by  FDA under an Emergency Use Authorization (EUA). This EUA will remain  in effect (meaning this test can be used) for the duration of the  Covid-19 declaration under Section  564(b)(1) of the Act, 21  U.S.C. section 360bbb-3(b)(1), unless the authorization is  terminated or revoked. Performed at [facility], [place] [address], [place], [place] [address]   SARS CORONAVIRUS 2 (TAT 6-24 HRS) Nasopharyngeal Nasopharyngeal Swab     Status: None   Collection Time: 02/17/19 12:50 AM   Specimen: Nasopharyngeal Swab  Result Value Ref Range Status   SARS Coronavirus 2 NEGATIVE NEGATIVE Final    Comment: (NOTE) SARS-CoV-2 target nucleic acids are NOT DETECTED. The SARS-CoV-2 RNA is generally detectable in upper and lower respiratory specimens during the acute phase of infection. Negative results do not preclude SARS-CoV-2 infection, do not rule out co-infections with other pathogens, and should not be used as the sole basis for treatment or other patient management decisions. Negative results must be combined with clinical observations, patient history, and epidemiological information. The expected result is Negative. Fact Sheet for Patients: SugarRoll.be Fact Sheet for Healthcare Providers: https://www.woods-Mccauley Diehl.com/ This test is not yet approved or cleared by the [place] FDA and  has been authorized for detection and/or diagnosis of SARS-CoV-2 by FDA under an Emergency Use Authorization (EUA). This EUA will remain  in effect (meaning this test can be used) for the duration of the COVID-19 declaration under Section 56 4(b)(1) of the Act, 21 U.S.C. section 360bbb-3(b)(1), unless the authorization is terminated or revoked sooner. Performed at [facility], [place] [address] [place], [place] [address]   MRSA PCR Screening     Status: None   Collection Time: 02/17/19  3:25 AM   Specimen: Nasopharyngeal Swab  Result Value Ref Range Status   MRSA by PCR NEGATIVE NEGATIVE Final    Comment:        The GeneXpert MRSA Assay (FDA approved for NASAL specimens only), is one component of a comprehensive MRSA  colonization surveillance program. It is not intended to diagnose MRSA infection nor to guide or monitor treatment for MRSA infections. Performed at [facility], [place] [address], [place], [place] [address]   SARS CORONAVIRUS 2 (TAT 6-24 HRS) Nasopharyngeal Nasopharyngeal Swab     Status: None   Collection Time: 02/20/19  6:50 PM   Specimen: Nasopharyngeal Swab  Result Value Ref Range Status   SARS Coronavirus 2 NEGATIVE NEGATIVE Final    Comment: (NOTE) SARS-CoV-2 target nucleic acids are NOT DETECTED. The SARS-CoV-2 RNA is generally detectable in upper and lower respiratory specimens during the acute phase of infection. Negative results do not preclude SARS-CoV-2 infection, do not rule out co-infections with other pathogens, and should not be used as the sole basis for treatment or other patient management decisions. Negative results must be combined with clinical observations, patient history, and epidemiological information. The expected result is Negative. Fact Sheet for Patients: SugarRoll.be Fact Sheet for Healthcare Providers: https://www.woods-Primo Innis.com/ This test is not yet approved or cleared by the [place] FDA and  has been authorized for detection and/or diagnosis  of SARS-CoV-2 by FDA under an Emergency Use Authorization (EUA). This EUA will remain  in effect (meaning this test can be used) for the duration of the COVID-19 declaration under Section 56 4(b)(1) of the Act, 21 U.S.C. section 360bbb-3(b)(1), unless the authorization is terminated or revoked sooner. Performed at [facility], [place] [address], [place], [place] [address]   SARS CORONAVIRUS 2 (TAT 6-24 HRS) Nasopharyngeal Nasopharyngeal Swab     Status: None   Collection Time: 02/23/19  2:20 PM   Specimen: Nasopharyngeal Swab  Result Value Ref Range Status   SARS Coronavirus 2 NEGATIVE NEGATIVE Final    Comment: (NOTE) SARS-CoV-2 target nucleic  acids are NOT DETECTED. The SARS-CoV-2 RNA is generally detectable in upper and lower respiratory specimens during the acute phase of infection. Negative results do not preclude SARS-CoV-2 infection, do not rule out co-infections with other pathogens, and should not be used as the sole basis for treatment or other patient management decisions. Negative results must be combined with clinical observations, patient history, and epidemiological information. The expected result is Negative. Fact Sheet for Patients: SugarRoll.be Fact Sheet for Healthcare Providers: https://www.woods-Annleigh Knueppel.com/ This test is not yet approved or cleared by the [place] FDA and  has been authorized for detection and/or diagnosis of SARS-CoV-2 by FDA under an Emergency Use Authorization (EUA). This EUA will remain  in effect (meaning this test can be used) for the duration of the COVID-19 declaration under Section 56 4(b)(1) of the Act, 21 U.S.C. section 360bbb-3(b)(1), unless the authorization is terminated or revoked sooner. Performed at [facility], [place] [address], [place],  [address]      Time coordinating discharge:  39 minutes  SIGNED:   Georgette Shell, MD  [facility] 02/24/2019, 9:25 AM Pager   If 7PM-7AM, please contact night-coverage [email or web address] Password [email or web address]

## 2019-02-24 NOTE — TOC Progression Note (Signed)
Transition of Care [facility]) - Progression Note    Patient Details  Name: [name] MRN: [id number] Date of Birth: 01/08/1946  Transition of Care [facility]) CM/SW [place], [place] Phone Number: 02/24/2019, 1:47 PM  Clinical Narrative:     CSW and RNCN met with patient at bedside for final choice on her decision of SNF verses Home. Patient states she has tried all day to get "someone" and has not been avle to reach someone that can assist her at home. Patient states she is agreeable to [facility]  Thurmond Butts, MSW, LCSWA Clinical Social Worker     Barriers to Discharge: Continued Medical Work up  Expected Discharge Plan and Services   In-house Referral: Clinical Social Work     Living arrangements for the past 2 months: Single Family Home Expected Discharge Date: 02/24/19                                     Social Determinants of Health (SDOH) Interventions    Readmission Risk Interventions Readmission Risk Prevention Plan 11/06/2018 11/05/2018  Transportation Screening Complete Complete  PCP or Specialist Appt within 3-5 Days Complete -  HRI or [place] Complete Complete  Social Work Consult for [place] Planning/Counseling Complete Complete  Palliative Care Screening Complete Complete  Medication Review [employer]) Complete Complete  Some recent data might be hidden

## 2019-02-25 ENCOUNTER — Non-Acute Institutional Stay: Payer: Medicare Other | Admitting: *Deleted

## 2019-02-25 ENCOUNTER — Other Ambulatory Visit: Payer: Self-pay

## 2019-02-25 ENCOUNTER — Telehealth: Payer: Self-pay | Admitting: *Deleted

## 2019-02-25 DIAGNOSIS — J9621 Acute and chronic respiratory failure with hypoxia: Secondary | ICD-10-CM | POA: Diagnosis not present

## 2019-02-25 DIAGNOSIS — J449 Chronic obstructive pulmonary disease, unspecified: Secondary | ICD-10-CM | POA: Diagnosis not present

## 2019-02-25 DIAGNOSIS — Z515 Encounter for palliative care: Secondary | ICD-10-CM

## 2019-02-25 DIAGNOSIS — I5032 Chronic diastolic (congestive) heart failure: Secondary | ICD-10-CM | POA: Diagnosis not present

## 2019-02-25 DIAGNOSIS — E1165 Type 2 diabetes mellitus with hyperglycemia: Secondary | ICD-10-CM | POA: Diagnosis not present

## 2019-02-25 DIAGNOSIS — I10 Essential (primary) hypertension: Secondary | ICD-10-CM | POA: Diagnosis not present

## 2019-02-25 NOTE — Telephone Encounter (Signed)
Pt was on TCM rpeort admitted 02/16/19 for shortness of breath and wheezing. Chest x-ray revealed "Mild right basilar and moderate severity left basilar atelectasis and/or infiltrate. Focal opacification within the lateral aspect of the left lung base which is more prominent on the current study and may represent a more focal area of consolidation.A CT angio chest revealed "Moderate severity left lower lobe atelectasis and/or infiltrate with mild left-sided volume loss. Multiple stable subcentimeter right upper lobe and right middle lobe noncalcified lung nodules" Pt D/C 02/24/19 to SNF. Per summary will need to f/u w/PCP once she has been discharge.Marland KitchenJohny Chess.

## 2019-02-26 ENCOUNTER — Other Ambulatory Visit: Payer: Self-pay | Admitting: *Deleted

## 2019-02-26 DIAGNOSIS — I10 Essential (primary) hypertension: Secondary | ICD-10-CM

## 2019-02-26 NOTE — Patient Outreach (Signed)
Member screened for potential [facility] Care Management needs as a benefit of [place] Medicare.  [name] is currently receiving skilled care at [facility].   At [place] pm telephone called made to member at [phone]. Phone disconnected. Called SNF to see if someone could take a phone into member's room. Transferred to nursing station. Nursing station phone rang and rang.   Telephone call made to [facility] son [name] [phone]. Patient identifiers confirmed. [name] states member's phone is broken. Issues with her sims card. Discussed not having a phone to reach her at dc is unsafe. [name] reports he feels the same way. States he is currently in [place]. States his brother [name] from [place] is coming to [place] on Monday and that [name] could be primary contact post dc in the interim. [name] [phone].  [name] states his mother is adamant in returning home due to not wanting to pay copays. It is not a matter of her exhausting her SNF days. [name] also indicates member has no support at home and understands her being home alone is unsafe. However, member is adamant to do so. She has refused hospice services.   Telephone call made to [name] (son) [phone]. Patient identifiers confirmed. [name] states he should be in [place] sometime on Monday. [name]  agrees that member being at home alone is unsafe. States he understands she needs 24/7. [name] agrees to [employer] referral. He also agrees to being called post SNF dc since member does not have a current working phone.   Communication sent to facility SW to clarify transition plans and date. Referral sent to [facility]. Will make a referral to [place] and Social Worker for collaboration on high risk case.    Toni Rolling, MSN-Ed, RN,BSN [place] Acute Care Coordinator [phone] [facility]) [phone]  (Toll free office)

## 2019-03-01 DIAGNOSIS — J9610 Chronic respiratory failure, unspecified whether with hypoxia or hypercapnia: Secondary | ICD-10-CM | POA: Diagnosis not present

## 2019-03-01 DIAGNOSIS — F411 Generalized anxiety disorder: Secondary | ICD-10-CM | POA: Diagnosis not present

## 2019-03-01 DIAGNOSIS — R627 Adult failure to thrive: Secondary | ICD-10-CM | POA: Diagnosis not present

## 2019-03-01 DIAGNOSIS — J441 Chronic obstructive pulmonary disease with (acute) exacerbation: Secondary | ICD-10-CM | POA: Diagnosis not present

## 2019-03-01 DIAGNOSIS — F05 Delirium due to known physiological condition: Secondary | ICD-10-CM | POA: Diagnosis not present

## 2019-03-01 NOTE — Progress Notes (Signed)
COMMUNITY PALLIATIVE CARE RN NOTE  PATIENT NAME: [name] DOB: 1945-04-06 MRN: [id number]  PRIMARY CARE PROVIDER: Binnie Rail, MD  RESPONSIBLE PARTY:  Acct ID - Guarantor Home Phone Work Phone Relationship Acct Type  [id number] [facility] [phone]  Self P/F     [place], [address], [place], [place] [address]   Covid-19 Pre-screening Negative  PLAN OF CARE and INTERVENTION:  1. ADVANCE CARE PLANNING/GOALS OF CARE: Plan is for patient to return home after her Medicare days are completed. She has a DNR. 2. PATIENT/CAREGIVER EDUCATION: Symptom Management 3. DISEASE STATUS: Met with patient in her room at the facility. She just returned back to the facility yesterday, 02/24/19 after being hospitalized from from 02/16/19 to 02/24/19 for healthcare-associated pneumonia. She was treated with IV antibiotics, IV steroids and BiPap. 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 Upon arrival, patient is lying in bed asleep. She is aroused with verbal stimulation. She denies pain at this time. She appears comfortable and no dyspnea noted at the present time while she is still. She continues on Oxygen at 6L/min via [place]. Her oxygen level was 94% at rest. Dyspnea occurs along with drops in her oxygen saturation with exertion. She is ambulatory using her walker. She continues with PT at the facility. She only has 6 Medicare days left and her plan is to discharge back to her home. We discussed the risks of her being discharged back to her  home alone and re-addressed transitioning to hospice care for 24/7 support. Patient's biggest concern is losing her twice per week caregiver through a Medicare grant program if she goes with hospice care. Will continue to have ongoing conversations about hospice. She says that she understands the risk of going home alone, but is willing to take it. She is agreeable to continuing Palliative care services when back at home as  she was prior to her recent hospitalizations. Will continue to monitor.  HISTORY OF PRESENT ILLNESS:  This is a 74 yo female who current resides at [facility] facility for rehab. Palliative care continues to follow patient for ongoing goals of care and additional support. Will continue to follow patient when discharged back to her home.    CODE STATUS: DNR (changed during recent hospitalization) ADVANCED DIRECTIVES: N MOST FORM: yes PPS: 40%   PHYSICAL EXAM:   LUNGS: decreased breath sounds CARDIAC: Cor RRR EXTREMITIES: Trace bilateral lower extremity edema SKIN: Exposed skin is dry and intact  NEURO: Alert and oriented x 3, progressive generalized weakness, ambulatory w/walker   (Duration of visit and documentation 90 minutes)   Toni Eastern, RN BSN

## 2019-03-02 ENCOUNTER — Other Ambulatory Visit: Payer: Self-pay | Admitting: *Deleted

## 2019-03-02 NOTE — Patient Outreach (Signed)
[name] Post Acute Care Coordinator follow up.  Telephone call received from Wallace (son). States he purchased Ms. Gipe a new phone.   Ms. Huver new mobile phone number is [phone].   Will update [facility] referral center of new telephone number.   [name], MSN-Ed, RN,BSN [place] Acute Care Coordinator [phone] [facility]) [phone]  (Toll free office)

## 2019-03-02 NOTE — Patient Outreach (Addendum)
[name] Post Acute Care Coordinator follow up.  Correspondence with [facility] SNF social worker. [name] remains at [facility] SNF. Facilty SW states transition plan is return home with hospice services.   Telephone call made to son [name] [phone] to discuss transition plans. [name] confirms that he is still in town from [place] and that he will bring his mother home upon dc. However, he will have to return to [place]. [name] (son) will come from [place] to stay with member on Saturday. Discussed the importance of [name] having 24/7 and not being home alone. Both sons express understanding.   [name] called his brother [name] on a 3 way call. Brother expressed confusion about whether member will have hospice or not. States their brother [name] received conflicting information. Established that [name] is primary contact while he is in [place].   [name] states he will plan to get [name] a new phone. States he will provide Probation officer with the new number.   Writer sent communication to [employer] and facility SW. Made aware [name] and [name] are primary contact for [name] not [name]. Clarification requested about hospice admission plans.   Update sent to [facility] and [facility] CM team that hospice referral pending upon dc home.   Will continue to follow and collaborate as needed while residing in SNF.    Toni Rolling, MSN-Ed, RN,BSN [place] Acute Care Coordinator [phone] [facility]) [phone]  (Toll free office)

## 2019-03-03 ENCOUNTER — Telehealth: Payer: Self-pay | Admitting: Primary Care

## 2019-03-03 NOTE — Telephone Encounter (Signed)
LMTCB x1 for pt.  

## 2019-03-04 NOTE — Telephone Encounter (Signed)
Attempted to call pt but unable to reach. Unable to leave a VM due to no VM kicking in. Will try to call back later.

## 2019-03-04 NOTE — Telephone Encounter (Signed)
Spoke with the pt  She is calling us today from [facility] facility  She states that she was sent there after recent hospital stay for PNA  She feels her breathing has improved  She feels she is stable enough to go home, however, they refuse to release her  She reports that her son wanted to take her out of there and bring her home and the facility threatened to have him arrested  She states she does not understand why she is being told that she can not be home alone and also that hospice has been mentioned to her, and she is not ready for this either  She has not contacted her PCP with any of these concerns  I advised I am unsure how to help with her request to go home unfortunately, but will forward to APP of the day to see if they have any recs  Aaron Edelman, please advise thanks

## 2019-03-04 NOTE — Telephone Encounter (Signed)
Spoke with the pt and notified of response per Aaron Edelman  Nothing further needed at this time She will contact her PCP

## 2019-03-04 NOTE — Telephone Encounter (Signed)
I am sorry to hear that the patient is having these frustrations.I believe probably the best way for [place] to proceed forward is for the patient to follow-up with [facility] directly regarding her wanting to go home.As we have repeatedly reviewed with the patient in the past.  She may be able to go home if she is transferred to hospice therapy.  This has been reviewed with patient multiple times.  Patient continues to refuse hospice because she does not want a lose her in-home aide.If the patient feels that she is being unfairly treated she can always contact Adult Scientist, forensic or follow-up with leadership at [facility] [employer].  This is not anything that our office has control over.I would also recommend following up with primary care.Wyn Quaker, FNP

## 2019-03-08 ENCOUNTER — Other Ambulatory Visit: Payer: Self-pay | Admitting: *Deleted

## 2019-03-08 DIAGNOSIS — K219 Gastro-esophageal reflux disease without esophagitis: Secondary | ICD-10-CM | POA: Diagnosis not present

## 2019-03-08 DIAGNOSIS — R000 Tachycardia, unspecified: Secondary | ICD-10-CM | POA: Diagnosis not present

## 2019-03-08 DIAGNOSIS — J449 Chronic obstructive pulmonary disease, unspecified: Secondary | ICD-10-CM | POA: Diagnosis not present

## 2019-03-08 DIAGNOSIS — I10 Essential (primary) hypertension: Secondary | ICD-10-CM | POA: Diagnosis not present

## 2019-03-08 DIAGNOSIS — F05 Delirium due to known physiological condition: Secondary | ICD-10-CM | POA: Diagnosis not present

## 2019-03-08 DIAGNOSIS — R0602 Shortness of breath: Secondary | ICD-10-CM | POA: Diagnosis not present

## 2019-03-08 DIAGNOSIS — F419 Anxiety disorder, unspecified: Secondary | ICD-10-CM | POA: Diagnosis not present

## 2019-03-08 DIAGNOSIS — J441 Chronic obstructive pulmonary disease with (acute) exacerbation: Secondary | ICD-10-CM | POA: Diagnosis not present

## 2019-03-08 DIAGNOSIS — Z794 Long term (current) use of insulin: Secondary | ICD-10-CM | POA: Diagnosis not present

## 2019-03-08 DIAGNOSIS — E785 Hyperlipidemia, unspecified: Secondary | ICD-10-CM | POA: Diagnosis not present

## 2019-03-08 DIAGNOSIS — J9621 Acute and chronic respiratory failure with hypoxia: Secondary | ICD-10-CM | POA: Diagnosis not present

## 2019-03-08 DIAGNOSIS — I5030 Unspecified diastolic (congestive) heart failure: Secondary | ICD-10-CM | POA: Diagnosis not present

## 2019-03-08 DIAGNOSIS — F411 Generalized anxiety disorder: Secondary | ICD-10-CM | POA: Diagnosis not present

## 2019-03-08 DIAGNOSIS — Z6834 Body mass index (BMI) 34.0-34.9, adult: Secondary | ICD-10-CM | POA: Diagnosis not present

## 2019-03-08 DIAGNOSIS — M6281 Muscle weakness (generalized): Secondary | ICD-10-CM | POA: Diagnosis not present

## 2019-03-08 DIAGNOSIS — E1165 Type 2 diabetes mellitus with hyperglycemia: Secondary | ICD-10-CM | POA: Diagnosis not present

## 2019-03-08 DIAGNOSIS — F339 Major depressive disorder, recurrent, unspecified: Secondary | ICD-10-CM | POA: Diagnosis not present

## 2019-03-08 DIAGNOSIS — E6609 Other obesity due to excess calories: Secondary | ICD-10-CM | POA: Diagnosis not present

## 2019-03-08 NOTE — Patient Outreach (Signed)
[name] Post Acute Care Coordinator follow up.  Notification received from [facility] Social Worker that Ms. Bunton will remain in the facility for long term care.  Inquired whether hospice referral will be placed again.  Will notify [place] Management team and [facility]  that member will remain in facility for long term care.   [name], MSN-Ed, RN,BSN [place] Acute Care Coordinator [phone] [facility]) [phone]  (Toll free office)

## 2019-03-09 ENCOUNTER — Other Ambulatory Visit: Payer: Self-pay | Admitting: *Deleted

## 2019-03-09 NOTE — Patient Outreach (Signed)
[facility]) Care Management  03/09/2019  [name] December 10, 1945 [id number]   Received notification referral for skilled nursing facility transition of care follow up cancelled, patient will remain at skilled nursing facility long term.   Dahlia Nifong H. [name], BSN, [place] Management [facility] Telephonic CM Phone: [phone] Fax: [phone]

## 2019-03-15 NOTE — Progress Notes (Deleted)
Virtual Visit via Video Note  I connected with [name] on 03/15/19 at  3:00 PM EST by a video enabled telemedicine application and verified that I am speaking with the correct person using two identifiers.   I discussed the limitations of evaluation and management by telemedicine and the availability of in person appointments. The patient expressed understanding and agreed to proceed.  Present for the visit:  Myself, Dr Billey Gosling, Fatuma Melikian.  The patient is currently at home and I am in the office.    No referring provider.    History of Present Illness: She is here for follow up of her chronic medical conditions.   COPD, chronic resp failure on oxygen:  Diabetes: She is taking her medication daily as prescribed. She is compliant with a diabetic diet.  She monitors her sugars and they have been running XXX. She denies numbness/tingling in her feet and foot lesions. She is up-to-date with an ophthalmology examination.   HFpEF, Hypertension: She is taking her medication daily. She is compliant with a low sodium diet.  She denies chest pain, palpitations, edema, shortness of breath and regular headaches. She does not monitor her blood pressure at home.    Anxiety: She is taking her medication daily as prescribed. She denies any side effects from the medication. She feels her anxiety is well controlled and she is happy with her current dose of medication.   GERD:  She is taking her medication daily as prescribed.  She denies any GERD symptoms and feels her GERD is well controlled.     Social History   Socioeconomic History  . Marital status: Divorced    Spouse name: Not on file  . Number of children: 0  . Years of education: Not on file  . Highest education level: Not on file  Occupational History  . Occupation: retired  Tobacco Use  . Smoking status: Current Every Day Smoker    Packs/day: 0.25    Years: 50.00    Pack years: 12.50    Types: Cigarettes  . Smokeless  tobacco: Never Used  . Tobacco comment: 3-4 cigaretters per day 11/20/2017   Substance and Sexual Activity  . Alcohol use: Yes    Alcohol/week: 0.0 standard drinks    Comment: occasional  . Drug use: No  . Sexual activity: Not Currently  Other Topics Concern  . Not on file  Social History Narrative   Married but recently separated from husband   Social Determinants of Health   Financial Resource Strain: Low Risk   . Difficulty of Paying Living Expenses: Not very hard  Food Insecurity: No Food Insecurity  . Worried About Charity fundraiser in the Last Year: Never true  . Ran Out of Food in the Last Year: Never true  Transportation Needs: Unmet Transportation Needs  . Lack of Transportation (Medical): Yes  . Lack of Transportation (Non-Medical): No  Physical Activity: Inactive  . Days of Exercise per Week: 0 days  . Minutes of Exercise per Session: 0 min  Stress: Stress Concern Present  . Feeling of Stress : To some extent  Social Connections: Unknown  . Frequency of Communication with Friends and Family: Never  . Frequency of Social Gatherings with Friends and Family: Never  . Attends Religious Services: Not on file  . Active Member of Clubs or Organizations: Not on file  . Attends Archivist Meetings: Not on file  . Marital Status: Divorced     Observations/Objective: Appears well  in NAD   Assessment and Plan:  See Problem List for Assessment and Plan of chronic medical problems.   Follow Up Instructions:    I discussed the assessment and treatment plan with the patient. The patient was provided an opportunity to ask questions and all were answered. The patient agreed with the plan and demonstrated an understanding of the instructions.   The patient was advised to call back or seek an in-person evaluation if the symptoms worsen or if the condition fails to improve as anticipated.    Binnie Rail, MD

## 2019-03-16 ENCOUNTER — Ambulatory Visit: Payer: Medicare Other | Admitting: Internal Medicine

## 2019-03-16 DIAGNOSIS — J441 Chronic obstructive pulmonary disease with (acute) exacerbation: Secondary | ICD-10-CM | POA: Diagnosis not present

## 2019-03-16 DIAGNOSIS — Z0289 Encounter for other administrative examinations: Secondary | ICD-10-CM

## 2019-03-16 DIAGNOSIS — F411 Generalized anxiety disorder: Secondary | ICD-10-CM | POA: Diagnosis not present

## 2019-03-16 DIAGNOSIS — F05 Delirium due to known physiological condition: Secondary | ICD-10-CM | POA: Diagnosis not present

## 2019-03-16 DIAGNOSIS — J9621 Acute and chronic respiratory failure with hypoxia: Secondary | ICD-10-CM | POA: Diagnosis not present

## 2019-03-17 ENCOUNTER — Other Ambulatory Visit: Payer: Self-pay | Admitting: *Deleted

## 2019-03-17 DIAGNOSIS — F411 Generalized anxiety disorder: Secondary | ICD-10-CM | POA: Diagnosis not present

## 2019-03-17 DIAGNOSIS — J9621 Acute and chronic respiratory failure with hypoxia: Secondary | ICD-10-CM | POA: Diagnosis not present

## 2019-03-17 DIAGNOSIS — J441 Chronic obstructive pulmonary disease with (acute) exacerbation: Secondary | ICD-10-CM | POA: Diagnosis not present

## 2019-03-17 DIAGNOSIS — R0602 Shortness of breath: Secondary | ICD-10-CM | POA: Diagnosis not present

## 2019-03-17 DIAGNOSIS — M6281 Muscle weakness (generalized): Secondary | ICD-10-CM | POA: Diagnosis not present

## 2019-03-17 NOTE — Patient Outreach (Signed)
[name] Post Acute Care Coordinator follow up  Received notification from [place] planner states transition plans have changed with [name].  Member previously transitioned to long term care at [facility]. However, facility dc planner reports member wants to return home instead. States the facility NP feels member has capacity to make her own decisions now and that her confusion has cleared. States her son [name] is aware and is in agreement with transition plan to return home.   Facility dc planner also reports member does not want hospice services. She will return home alone with aide services thru [facility] and home health.   Referral previously made to [facility]. [facility] made aware of member's change of plans to go home. Will plan to make referral to [place] and [facility] LCSW as well.   [name] is slated for dc from [facility] on tomorrow 03/18/19.   [name] is high risk for readmission. She has not accepted recommendations for hospice or long term care.   Medical history COPD, wears oxygen, CHF, HTN, HLD.   [name], MSN-Ed, RN,BSN [place] Acute Care Coordinator [phone] [facility]) [phone]  (Toll free office)

## 2019-03-18 ENCOUNTER — Telehealth: Payer: Self-pay | Admitting: *Deleted

## 2019-03-18 NOTE — Telephone Encounter (Signed)
Tried calling pt to f/u discharge from SNF. There was no answer LMOM RTC.Marland KitchenJohny Chess

## 2019-03-18 NOTE — Telephone Encounter (Signed)
-----   Message from Harless Litten, RN sent at 03/17/2019  4:13 PM EST ----- Regarding: RE: Kaye Thank you Lorre Nick! Big hugs :) ----- Message ----- From: Earnstine Regal, RMA Sent: 03/17/2019   3:59 PM EST To: Harless Litten, RN Subject: RE: Ulla Potash!   I'm good and hope you are as well. Ok I will call Ms. Sobotta tomorrow and get her set up for TCM follow-up w/her provider. Thank you ----- Message ----- From: Harless Litten, RN Sent: 03/17/2019   3:25 PM EST To: Earnstine Regal, RMA Subject: Marian Sorrow!  I hope you are doing well! Wanted to touch base with you about Tea Ose. She is discharging from [facility] on tomorrow 03/18/19. Per facility dc planner, Ms. Luberto does not want to remain in facility long term and does not want hospice. She has elected to return home. Wanted to make you aware for follow up appt purposes. She is super high risk for readmit. It  disappointed that she did not accept hospice services or remain at facility. She will have home health and the facility dc planner indicates she is going to get caregiver services thru [facility].  I will make a referral to [facility] and to [place] [facility].   Thank you Lorre Nick and take very good care :)  Lolita Lenz, MSN-Ed, RN,BSN [place] Acute Care Coordinator [phone] [facility]) [phone]  (Toll free office)

## 2019-03-19 ENCOUNTER — Other Ambulatory Visit: Payer: Self-pay | Admitting: *Deleted

## 2019-03-19 NOTE — Telephone Encounter (Signed)
Called pt to make f/u appt w/PCP. Pt states she is still at the SNF. She states she's not sure whn she will be discharge. Once nurse told her that they was working on the discharge papers, but she hasn't been discharge yet. Inform pt will call back on Monday to follow=up.Toni KitchenJohny Chess

## 2019-03-19 NOTE — Patient Outreach (Signed)
[facility]) Care Management  03/19/2019  Remi Blankenbiller January 11, 1946 [id number]   Subjective: Telephone call to patient's home / mobile number, spoke with female answering the phone, states Santita Wingler not available, female asked caller to hold for a minute, caller placed on hold for approximately 5 minutes,  receiver disconnected the call, and unable to a leave a message.      Objective:Per KPN (Knowledge Performance Now, point of care tool) and chart review,patient inpatient at skilled nursing facility [name]) 02/24/2019 - 03/18/2019 for rehab.  Patient  hospitalized 02/16/2019 -02/24/2019 for HCAP (healthcare-associated pneumonia).   Inpatient at skilled nursing facility [facility]) for rehab 02/02/2019 - 02/16/2019 for rehab.   Patient hospitalized 01/20/2019 - 02/02/2019 for Acute on chronic hypoxic and hypercapnic respiratory failure, Pleural Effusion.   Patient hospitalized10/12/2018 - 11/06/2018 for COPD exacerbation,Mediastinal LAD and pulmonary nodules, hospitalized8/05/2018 - 09/05/2018 forAcute on chronic respiratory failure with hypoxia, COPD,Acute on chronic diastolic CHF (congestive heart failure). Patient also has a history of diabetes, hyperlipidemia, tobacco abuse, and OSA. Patient has been active with [place] [facility] in the past for COPDcomplex case management. Patient currently active with [name]. Has [name] for aide assistance. Lives alone. High risk for readmission. Limited support. Wears home 02 and has trilogy. Discharged from [facility] 09/25/2018.     Assessment: Received Medicare [place] Acute Care Coordinatorreferral on2/24/2021.  Referral source:[name]. Referral reason:Please assign to [place] and [name] for care coordination and community resources. To discharge from [facility] on 03/18/2019 with home health and  [facility] for caregiver services. Was long term care and now wants to return home. Declined hospice. Found to be competent to make decisions per facility NP. [facility] referral made as well. [facility] is attempting to get home visit schedule for Friday. Due to very high risk readmission, [facility] CM referral made as well for care coordination. Facility states son [name] aware of dc plan and is in agreement.  Transition of care follow up pending patient contact.       Plan:RNCM will send unsuccessful outreach letter, [facility] pamphlet, will call patient for 2nd telephone outreach attempt within 4 business days, transition of care follow up, and proceed with case closure, within 10 business days if no return call.     Litisha Guagliardo H. [name], BSN, [place] Management [facility] Telephonic CM Phone: [phone] Fax: [phone]

## 2019-03-22 ENCOUNTER — Other Ambulatory Visit: Payer: Self-pay | Admitting: *Deleted

## 2019-03-22 NOTE — Patient Outreach (Signed)
[name] Post Acute Care Coordinator follow up  Confirmed with [place] planner that Ms. Secrest remains in the facility. She did not dc home as previously planned. She is now under private pay with Medicaid pending.   Will update [facility], [facility] Care Management, and PCP office.   [name], MSN-Ed, RN,BSN [place] Acute Care Coordinator [phone] [facility]) [phone]  (Toll free office)

## 2019-03-23 NOTE — Telephone Encounter (Signed)
Pt is still st the SNF was not discharge over the weekend. She states she doesn't know when she will be home. She will contact us once she has been discharge.Marland KitchenJohny [name]

## 2019-03-24 ENCOUNTER — Ambulatory Visit: Payer: Self-pay | Admitting: *Deleted

## 2019-03-25 DIAGNOSIS — I10 Essential (primary) hypertension: Secondary | ICD-10-CM | POA: Diagnosis not present

## 2019-03-25 DIAGNOSIS — F411 Generalized anxiety disorder: Secondary | ICD-10-CM | POA: Diagnosis not present

## 2019-03-25 DIAGNOSIS — J449 Chronic obstructive pulmonary disease, unspecified: Secondary | ICD-10-CM | POA: Diagnosis not present

## 2019-03-25 DIAGNOSIS — M6281 Muscle weakness (generalized): Secondary | ICD-10-CM | POA: Diagnosis not present

## 2019-03-25 DIAGNOSIS — I5032 Chronic diastolic (congestive) heart failure: Secondary | ICD-10-CM | POA: Diagnosis not present

## 2019-03-25 DIAGNOSIS — J9621 Acute and chronic respiratory failure with hypoxia: Secondary | ICD-10-CM | POA: Diagnosis not present

## 2019-03-25 DIAGNOSIS — E1165 Type 2 diabetes mellitus with hyperglycemia: Secondary | ICD-10-CM | POA: Diagnosis not present

## 2019-03-29 ENCOUNTER — Other Ambulatory Visit: Payer: Self-pay | Admitting: *Deleted

## 2019-03-29 NOTE — Patient Outreach (Signed)
[facility] Post Acute Care Coordinator follow up  [facility] SNF social worker confirms Ms. Tusing discharged on 03/26/19. [facility] was arranged for PT/OT/aide/ RN/ SW.   Notification sent to [facility] and [place] Management team.     [name], MSN-Ed, RN,BSN [place] Acute Care Coordinator [phone] [facility]) [phone]  (Toll free office)

## 2019-03-29 NOTE — Patient Outreach (Addendum)
[facility]) Care Management  03/29/2019  [name] 12-Oct-1945 [id number]   Subjective: Received update from [place], patient did not discharge from skilled nursing on 03/18/2019 but did discharged on 03/26/2019.   Patient discharged to home with the following home health services from [facility]: physical therapy, occupational therapy, RN, Education officer, museum, aide. Additional aide services will also resume with [facility].  [facility] will also see patient for start of care on 03/30/2019 at 3:00pm.   Telephone call to patient's home / mobile number, spoke with patient, and HIPAA verified.  Patient requested call back at a later time and disconnected call.   Objective:Per KPN (Knowledge Performance Now, point of care tool) and chart review,patient inpatient at skilled nursing facility [name]) 02/24/2019 - 03/26/2019 for rehab.  Patient  hospitalized 02/16/2019 -02/24/2019 for HCAP (healthcare-associated pneumonia).   Inpatient at skilled nursing facility [facility]) for rehab 02/02/2019 - 02/16/2019 for rehab.   Patient hospitalized 01/20/2019 - 02/02/2019 for Acute on chronic hypoxic and hypercapnic respiratory failure, Pleural Effusion.   Patient hospitalized10/12/2018 - 11/06/2018 for COPD exacerbation,Mediastinal LAD and pulmonary nodules, hospitalized8/05/2018 - 09/05/2018 forAcute on chronic respiratory failure with hypoxia, COPD,Acute on chronic diastolic CHF (congestive heart failure). Patient also has a history of diabetes, hyperlipidemia, tobacco abuse, and OSA. Patient has been active with [place] [facility] in the past for COPDcomplex case management. Patient currently active with [name]. Has [name] for aide assistance. Lives alone. High risk for readmission. Limited support. Wears home 02 and has trilogy. Discharged from [place] 09/25/2018.     Assessment:  Received Medicare [place] Acute Care Coordinatorreferral on2/24/2021.  Referral source:[name]. Referral reason:Please assign to [place] and [name] for care coordination and community resources. To discharge from [place] on 03/18/2019 with home health and [facility] for caregiver services. Was long term care and now wants to return home. Declined hospice. Found to be competent to make decisions per facility NP. [facility] referral made as well. [facility] is attempting to get home visit schedule for Friday. Due to very high risk readmission, [facility] CM referral made as well for care coordination. Facility states son [name] aware of dc plan and is in agreement.  Transition of care follow up pending patient contact.       Plan:RNCM has sent unsuccessful outreach letter, [facility] pamphlet, will call patient for 2nd telephone outreach attempt within 4 business days, transition of care follow up, and proceed with case closure, within 10 business days if no return call.    Kaelei Wheeler H. [name], BSN, [place] Management [facility] Telephonic CM Phone: [phone] Fax: [phone]

## 2019-03-30 ENCOUNTER — Telehealth: Payer: Self-pay | Admitting: *Deleted

## 2019-03-30 NOTE — Telephone Encounter (Signed)
Tried calling pt to follow=-up from being discharge from SNF. There was no answer x's 10 rings. No vm..will try to call again later.Marland KitchenJohny Chess

## 2019-03-31 ENCOUNTER — Other Ambulatory Visit: Payer: Self-pay | Admitting: *Deleted

## 2019-03-31 DIAGNOSIS — I5033 Acute on chronic diastolic (congestive) heart failure: Secondary | ICD-10-CM | POA: Diagnosis not present

## 2019-03-31 DIAGNOSIS — E119 Type 2 diabetes mellitus without complications: Secondary | ICD-10-CM | POA: Diagnosis not present

## 2019-03-31 NOTE — Telephone Encounter (Signed)
Tried calling pt again still no answer../l;mb

## 2019-03-31 NOTE — Patient Outreach (Signed)
[name]) Care [name]  03/31/2019  [name] [birth year] [id number]   Subjective: Received secure message from [name] at [place] [name], patient will be seen by [name] [phone]) today from [facility].  Telephone call to patient's home  / mobile number, no answer, message number is not accepting calls at this time, and unable to leave a message.    Objective:Per KPN (Knowledge Performance Now, point of care tool) and chart review,patient inpatient at skilled nursing facility [name]) 02/24/2019 - 03/26/2019 for rehab. Patient hospitalized 02/16/2019 -02/24/2019 for HCAP (healthcare-associated pneumonia). Inpatient at skilled nursing facility [name]) for rehab 02/02/2019 - 02/16/2019 for rehab. Patient hospitalized 01/20/2019 - 02/02/2019 for Acute on chronic hypoxic and hypercapnic respiratory failure,Pleural Effusion. Patient hospitalized10/12/2018 - 11/06/2018 for COPD exacerbation,Mediastinal LAD and pulmonary nodules, hospitalized8/05/2018 - 09/05/2018 forAcute on chronic respiratory failure with hypoxia, COPD,Acute on chronic diastolic CHF (congestive heart failure). Patient also has a history of diabetes, hyperlipidemia, tobacco abuse, and OSA. Patient has been active with [name] in the past for COPDcomplex case [name]. Patient currently active with [name]. Has [name] for aide assistance. Lives alone. High risk for readmission. Limited support. Wears home 02 and has trilogy. [facility].     Assessment: Received Medicare [name] Acute Care Coordinatorreferral on2/24/2021. Referral source:[name]. Referral reason:Please assign to [place] and [name] for care coordination and community resources. [facility] health and [facility] for caregiver services. Was long term care and now wants to return home. Declined hospice. Found to be competent to make decisions per facility NP. [facility] referral made as well. [facility] is attempting to get home visit schedule for Friday. Due to very high risk readmission, [facility] CM referral made as well for care coordination. Facility states son [name] aware of dc plan and is in agreement. Transition of care follow up pending patient contact.      Plan:RNCM has sent unsuccessful outreach letter, [facility] pamphlet, will call patient for 3rd telephone outreach attempt within 7 business days, transition of care follow up, and proceed with case closure, within 10 business days if no return call.    [name], BSN, [place] [name] Telephonic CM Phone: [phone] Fax: [phone]

## 2019-04-01 NOTE — Telephone Encounter (Signed)
Tried calling pt again still no answer not able to leave vm due to not having one. Closing encounter.Marland KitchenJohny Chess

## 2019-04-05 ENCOUNTER — Other Ambulatory Visit: Payer: Self-pay | Admitting: *Deleted

## 2019-04-05 ENCOUNTER — Ambulatory Visit: Payer: Self-pay | Admitting: *Deleted

## 2019-04-05 NOTE — Patient Outreach (Signed)
[name] [facility]) Care Management  04/05/2019  [name] 1945/11/11 [id number]   CSW received referral from [place], [place] for care coordination and community resources. Patient has been setup with [facility] for home health PT, OT, nurse, SW and aide and [facility] for caregiver services (unsure of the hours of [name]). Patient was long term care at [facility] but decided to return home, declined hospice despite multiple referrals and attempts & was found to be competent to make decisions per [name], Tour manager at [facility] - feels she is in her right mind and is able to make her own decisions, she feels whatever confusion was there a couple weeks ago has cleared & that she had also progressed enough in PT and [name] is ok with patient discharging . Patient's son [name] was aware of discharge plan and is in agreement. Son from [place] is coming to [place] some time on Monday and should be primary contact intially - [name] (son) [phone]. Medicaid caseworker had sent a packet of information to the facility one day last week to sign so the caseworker could help her with the application.  Unfortunately the Medicaid application we completed was for LTC and since she went home it will become null and void.  She needs to complete an application for [employer].   Patient discharged home from [facility] on Friday, 03/26/19. [facility] RN, Gilmore Laroche following as well. Aide through [place] is set to work with her starting 3/17 with the hours 2:30 - 6:30 Tues/Wed/Fri. CSW left message for [facility] to have patient's meals reinstated since they were put on hold when she was admitted to the hospital, patient has the phone number to [employer] of [place]/211 as well.    Raynaldo Opitz, LCSW [facility]  Clinical Social Worker cell #: [phone]

## 2019-04-06 ENCOUNTER — Ambulatory Visit: Payer: Self-pay | Admitting: *Deleted

## 2019-04-07 ENCOUNTER — Other Ambulatory Visit: Payer: Self-pay | Admitting: *Deleted

## 2019-04-07 NOTE — Patient Outreach (Signed)
[name]) Care Management  04/07/2019  [name] July 02, 1945 [id number]   Subjective: Telephone call to patient's home  / mobile number, no answer, message number is not accepting calls at this time, and unable to leave a message.    Objective:Per KPN (Knowledge Performance Now, point of care tool) and chart review,patient inpatient at skilled nursing facility [name]) 02/24/2019 -03/26/2019 for rehab. Patient hospitalized 02/16/2019 -02/24/2019 for HCAP (healthcare-associated pneumonia). Inpatient at skilled nursing facility [name]) for rehab 02/02/2019 - 02/16/2019 for rehab. Patient hospitalized 01/20/2019 - 02/02/2019 for Acute on chronic hypoxic and hypercapnic respiratory failure,Pleural Effusion. Patient hospitalized10/12/2018 - 11/06/2018 for COPD exacerbation,Mediastinal LAD and pulmonary nodules, hospitalized8/05/2018 - 09/05/2018 forAcute on chronic respiratory failure with hypoxia, COPD,Acute on chronic diastolic CHF (congestive heart failure). Patient also has a history of diabetes, hyperlipidemia, tobacco abuse, and OSA. Patient has been active with [name] in the past for COPDcomplex case management. Patient currently active with [name]. Has [name] for aide assistance. Lives alone. High risk for readmission. Limited support. Wears home 02 and has trilogy. [facility].     Assessment: Received Medicare [place] Acute Care Coordinatorreferral on2/24/2021. Referral source:[name]. Referral reason:Please assign to [name] for care coordination and community resources. [facility] home health and [facility] for caregiver services. Was long term care and now wants to return home. Declined hospice. Found to be competent to make decisions per  facility NP. [facility] referral made as well. [facility] is attempting to get home visit schedule for Friday. Due to very high risk readmission, [name] CM referral made as well for care coordination. Facility states son [name] aware of dc plan and is in agreement. Transition of care follow up pending patient contact.      Plan:RNCMhassentunsuccessful outreach letter, [facility] pamphlet, will call patient for 4th telephone outreach attempt within 21 business days, transition of care follow up, and will assess for case closure, within 21 business days if no return call.    [name] H. [name], BSN, [name] Telephonic CM Phone: [phone] Fax: [phone]

## 2019-04-09 ENCOUNTER — Telehealth: Payer: Self-pay | Admitting: Internal Medicine

## 2019-04-09 NOTE — Telephone Encounter (Signed)
Okay for verbal 

## 2019-04-09 NOTE — Telephone Encounter (Signed)
    Melissa from [facility] calling for verbal orders, to delay  start of homecare on 3/20 Please call [phone]

## 2019-04-12 NOTE — Telephone Encounter (Signed)
Gave ok for orders.  

## 2019-04-13 ENCOUNTER — Ambulatory Visit: Payer: Self-pay | Admitting: *Deleted

## 2019-04-13 ENCOUNTER — Telehealth: Payer: Self-pay

## 2019-04-13 NOTE — Telephone Encounter (Signed)
Her last visit was a while ago and in order to get PT/OT by insurance we need to have a more recent visit.

## 2019-04-13 NOTE — Telephone Encounter (Signed)
I guess this is an FYI? 

## 2019-04-13 NOTE — Telephone Encounter (Signed)
New message   The patient only wants PT & OT services.  PT will start on 3.29.21  Patient refused Nursing

## 2019-04-14 NOTE — Telephone Encounter (Signed)
Spoke with [facility]. This is just an [place].

## 2019-04-20 ENCOUNTER — Other Ambulatory Visit: Payer: Self-pay | Admitting: Internal Medicine

## 2019-04-21 ENCOUNTER — Telehealth: Payer: Self-pay | Admitting: Internal Medicine

## 2019-04-21 NOTE — Telephone Encounter (Signed)
Noted  

## 2019-04-21 NOTE — Telephone Encounter (Signed)
Last OV 11/10/18 Last RF 01/04/19

## 2019-04-21 NOTE — Telephone Encounter (Signed)
   Merry Proud from [facility] calling to report start of care has been delayed twice. Patient has been rescheduled again for this Thursday

## 2019-04-22 ENCOUNTER — Ambulatory Visit: Payer: Self-pay | Admitting: *Deleted

## 2019-04-22 DIAGNOSIS — Z9981 Dependence on supplemental oxygen: Secondary | ICD-10-CM | POA: Diagnosis not present

## 2019-04-22 DIAGNOSIS — F329 Major depressive disorder, single episode, unspecified: Secondary | ICD-10-CM | POA: Diagnosis not present

## 2019-04-22 DIAGNOSIS — E785 Hyperlipidemia, unspecified: Secondary | ICD-10-CM | POA: Diagnosis not present

## 2019-04-22 DIAGNOSIS — Z8701 Personal history of pneumonia (recurrent): Secondary | ICD-10-CM | POA: Diagnosis not present

## 2019-04-22 DIAGNOSIS — F411 Generalized anxiety disorder: Secondary | ICD-10-CM | POA: Diagnosis not present

## 2019-04-22 DIAGNOSIS — Z9181 History of falling: Secondary | ICD-10-CM | POA: Diagnosis not present

## 2019-04-22 DIAGNOSIS — J9611 Chronic respiratory failure with hypoxia: Secondary | ICD-10-CM | POA: Diagnosis not present

## 2019-04-22 DIAGNOSIS — Z8541 Personal history of malignant neoplasm of cervix uteri: Secondary | ICD-10-CM | POA: Diagnosis not present

## 2019-04-22 DIAGNOSIS — I058 Other rheumatic mitral valve diseases: Secondary | ICD-10-CM | POA: Diagnosis not present

## 2019-04-22 DIAGNOSIS — I499 Cardiac arrhythmia, unspecified: Secondary | ICD-10-CM | POA: Diagnosis not present

## 2019-04-22 DIAGNOSIS — Z7952 Long term (current) use of systemic steroids: Secondary | ICD-10-CM | POA: Diagnosis not present

## 2019-04-22 DIAGNOSIS — Z794 Long term (current) use of insulin: Secondary | ICD-10-CM | POA: Diagnosis not present

## 2019-04-22 DIAGNOSIS — I110 Hypertensive heart disease with heart failure: Secondary | ICD-10-CM | POA: Diagnosis not present

## 2019-04-22 DIAGNOSIS — E669 Obesity, unspecified: Secondary | ICD-10-CM | POA: Diagnosis not present

## 2019-04-22 DIAGNOSIS — Z6834 Body mass index (BMI) 34.0-34.9, adult: Secondary | ICD-10-CM | POA: Diagnosis not present

## 2019-04-22 DIAGNOSIS — E119 Type 2 diabetes mellitus without complications: Secondary | ICD-10-CM | POA: Diagnosis not present

## 2019-04-22 DIAGNOSIS — J449 Chronic obstructive pulmonary disease, unspecified: Secondary | ICD-10-CM | POA: Diagnosis not present

## 2019-04-22 DIAGNOSIS — J9612 Chronic respiratory failure with hypercapnia: Secondary | ICD-10-CM | POA: Diagnosis not present

## 2019-04-22 DIAGNOSIS — F17210 Nicotine dependence, cigarettes, uncomplicated: Secondary | ICD-10-CM | POA: Diagnosis not present

## 2019-04-22 DIAGNOSIS — I5030 Unspecified diastolic (congestive) heart failure: Secondary | ICD-10-CM | POA: Diagnosis not present

## 2019-04-22 DIAGNOSIS — R222 Localized swelling, mass and lump, trunk: Secondary | ICD-10-CM | POA: Diagnosis not present

## 2019-04-26 DIAGNOSIS — I5030 Unspecified diastolic (congestive) heart failure: Secondary | ICD-10-CM | POA: Diagnosis not present

## 2019-04-26 DIAGNOSIS — J9612 Chronic respiratory failure with hypercapnia: Secondary | ICD-10-CM | POA: Diagnosis not present

## 2019-04-26 DIAGNOSIS — J449 Chronic obstructive pulmonary disease, unspecified: Secondary | ICD-10-CM | POA: Diagnosis not present

## 2019-04-26 DIAGNOSIS — E119 Type 2 diabetes mellitus without complications: Secondary | ICD-10-CM | POA: Diagnosis not present

## 2019-04-26 DIAGNOSIS — I110 Hypertensive heart disease with heart failure: Secondary | ICD-10-CM | POA: Diagnosis not present

## 2019-04-26 DIAGNOSIS — J9611 Chronic respiratory failure with hypoxia: Secondary | ICD-10-CM | POA: Diagnosis not present

## 2019-04-27 ENCOUNTER — Telehealth: Payer: Self-pay

## 2019-04-27 ENCOUNTER — Ambulatory Visit: Payer: Self-pay | Admitting: *Deleted

## 2019-04-27 DIAGNOSIS — E119 Type 2 diabetes mellitus without complications: Secondary | ICD-10-CM | POA: Diagnosis not present

## 2019-04-27 DIAGNOSIS — J449 Chronic obstructive pulmonary disease, unspecified: Secondary | ICD-10-CM | POA: Diagnosis not present

## 2019-04-27 DIAGNOSIS — I5030 Unspecified diastolic (congestive) heart failure: Secondary | ICD-10-CM | POA: Diagnosis not present

## 2019-04-27 DIAGNOSIS — I110 Hypertensive heart disease with heart failure: Secondary | ICD-10-CM | POA: Diagnosis not present

## 2019-04-27 DIAGNOSIS — J9611 Chronic respiratory failure with hypoxia: Secondary | ICD-10-CM | POA: Diagnosis not present

## 2019-04-27 DIAGNOSIS — J9612 Chronic respiratory failure with hypercapnia: Secondary | ICD-10-CM | POA: Diagnosis not present

## 2019-04-27 NOTE — Telephone Encounter (Signed)
Gave ok for orders.  

## 2019-04-27 NOTE — Telephone Encounter (Signed)
ok 

## 2019-04-27 NOTE — Telephone Encounter (Signed)
New message    Per Erlene Quan with [facility] needs verbal orders   OT once a week for 4 weeks.

## 2019-04-28 ENCOUNTER — Telehealth: Payer: Self-pay

## 2019-04-28 NOTE — Telephone Encounter (Signed)
New message   Merry Proud from [facility] calling for verbal orders  PT- twice a week for 4 weeks   Then once a week for 4 weeks.

## 2019-04-28 NOTE — Telephone Encounter (Signed)
Gave ok for orders.  

## 2019-04-29 DIAGNOSIS — J9611 Chronic respiratory failure with hypoxia: Secondary | ICD-10-CM | POA: Diagnosis not present

## 2019-04-29 DIAGNOSIS — J9612 Chronic respiratory failure with hypercapnia: Secondary | ICD-10-CM | POA: Diagnosis not present

## 2019-04-29 DIAGNOSIS — I110 Hypertensive heart disease with heart failure: Secondary | ICD-10-CM | POA: Diagnosis not present

## 2019-04-29 DIAGNOSIS — J449 Chronic obstructive pulmonary disease, unspecified: Secondary | ICD-10-CM | POA: Diagnosis not present

## 2019-04-29 DIAGNOSIS — E119 Type 2 diabetes mellitus without complications: Secondary | ICD-10-CM | POA: Diagnosis not present

## 2019-04-29 DIAGNOSIS — I5030 Unspecified diastolic (congestive) heart failure: Secondary | ICD-10-CM | POA: Diagnosis not present

## 2019-04-30 ENCOUNTER — Other Ambulatory Visit: Payer: Self-pay | Admitting: *Deleted

## 2019-04-30 NOTE — Patient Outreach (Addendum)
[name] [facility]) Care Management  04/30/2019  [name] 1945-06-26 [id number]:[id number]   Subjective: Telephone call to patient's home / mobile number, no answer, message number is not accepting calls at this time, and unable to leave a message.    Objective:Per KPN (Knowledge Performance Now, point of care tool) and chart review,patient inpatient at skilled nursing facility [name]) 02/24/2019 -03/26/2019 for rehab. Patient hospitalized 02/16/2019 -02/24/2019 for HCAP (healthcare-associated pneumonia). Inpatient at skilled nursing facility [name]) for rehab 02/02/2019 - 02/16/2019 for rehab. Patient hospitalized 01/20/2019 - 02/02/2019 for Acute on chronic hypoxic and hypercapnic respiratory failure,Pleural Effusion. Patient hospitalized10/12/2018 - 11/06/2018 for COPD exacerbation,Mediastinal LAD and pulmonary nodules, hospitalized8/05/2018 - 09/05/2018 forAcute on chronic respiratory failure with hypoxia, COPD,Acute on chronic diastolic CHF (congestive heart failure). Patient also has a history of diabetes, hyperlipidemia, tobacco abuse, and OSA. Patient has been active with [name] in the past for COPDcomplex case management. Patient currently active with [name]. Has [name] for aide assistance. Lives alone. High risk for readmission. Limited support. Wears home 02 and has trilogy. [facility]     Assessment: Received [name] Coordinatorreferral on2/24/2021. Referral source:[name]. Referral reason:Please assign to [name] and [name] for care coordination and community resources. [facility] and [facility] for caregiver services. Was long term care and now wants to return home. Declined hospice. Found to be competent to make decisions  per facility NP. [facility] referral made as well. [facility] is attempting to get home visit schedule for Friday. Due to very high risk readmission, [name] CM referral made as well for care coordination. Facility states son [name] aware of dc plan and is in agreement. Transition of care follow up not completed due to unable to contact patient and will proceed with case closure.       Plan:Case closure to this RNCM  due to patient unable to reach. RNCM will send  MD case closure letter.  RNCM will send case statu update via in-basket message to assigned [name] ,Social Worker [name]).         [name] H. [name], BSN, [place] [facility] Telephonic CM Phone: [phone] Fax: [phone]

## 2019-05-03 DIAGNOSIS — I5030 Unspecified diastolic (congestive) heart failure: Secondary | ICD-10-CM | POA: Diagnosis not present

## 2019-05-03 DIAGNOSIS — J9612 Chronic respiratory failure with hypercapnia: Secondary | ICD-10-CM | POA: Diagnosis not present

## 2019-05-03 DIAGNOSIS — E119 Type 2 diabetes mellitus without complications: Secondary | ICD-10-CM | POA: Diagnosis not present

## 2019-05-03 DIAGNOSIS — I110 Hypertensive heart disease with heart failure: Secondary | ICD-10-CM | POA: Diagnosis not present

## 2019-05-03 DIAGNOSIS — J449 Chronic obstructive pulmonary disease, unspecified: Secondary | ICD-10-CM | POA: Diagnosis not present

## 2019-05-03 DIAGNOSIS — J9611 Chronic respiratory failure with hypoxia: Secondary | ICD-10-CM | POA: Diagnosis not present

## 2019-05-04 ENCOUNTER — Ambulatory Visit: Payer: Self-pay | Admitting: *Deleted

## 2019-05-05 DIAGNOSIS — I110 Hypertensive heart disease with heart failure: Secondary | ICD-10-CM | POA: Diagnosis not present

## 2019-05-05 DIAGNOSIS — E119 Type 2 diabetes mellitus without complications: Secondary | ICD-10-CM | POA: Diagnosis not present

## 2019-05-05 DIAGNOSIS — J9611 Chronic respiratory failure with hypoxia: Secondary | ICD-10-CM | POA: Diagnosis not present

## 2019-05-05 DIAGNOSIS — J9612 Chronic respiratory failure with hypercapnia: Secondary | ICD-10-CM | POA: Diagnosis not present

## 2019-05-05 DIAGNOSIS — I5030 Unspecified diastolic (congestive) heart failure: Secondary | ICD-10-CM | POA: Diagnosis not present

## 2019-05-05 DIAGNOSIS — J449 Chronic obstructive pulmonary disease, unspecified: Secondary | ICD-10-CM | POA: Diagnosis not present

## 2019-05-10 DIAGNOSIS — I110 Hypertensive heart disease with heart failure: Secondary | ICD-10-CM | POA: Diagnosis not present

## 2019-05-10 DIAGNOSIS — E119 Type 2 diabetes mellitus without complications: Secondary | ICD-10-CM | POA: Diagnosis not present

## 2019-05-10 DIAGNOSIS — J9611 Chronic respiratory failure with hypoxia: Secondary | ICD-10-CM | POA: Diagnosis not present

## 2019-05-10 DIAGNOSIS — I5030 Unspecified diastolic (congestive) heart failure: Secondary | ICD-10-CM | POA: Diagnosis not present

## 2019-05-10 DIAGNOSIS — J449 Chronic obstructive pulmonary disease, unspecified: Secondary | ICD-10-CM | POA: Diagnosis not present

## 2019-05-10 DIAGNOSIS — J9612 Chronic respiratory failure with hypercapnia: Secondary | ICD-10-CM | POA: Diagnosis not present

## 2019-05-12 ENCOUNTER — Telehealth: Payer: Self-pay

## 2019-05-12 NOTE — Telephone Encounter (Signed)
Gave ok for verbal orders.  

## 2019-05-12 NOTE — Telephone Encounter (Signed)
Home Health Verbal Orders - Caller/Agency: [facility] Number: [phone] (can leave a voicemail) Requesting OT/PT/Skilled Nursing/Social Work/Speech Therapy: Social Work Frequency:  States that patient received a bill from her nursing home and is stressing out about that. Would like social work for resources.

## 2019-05-14 DIAGNOSIS — J9612 Chronic respiratory failure with hypercapnia: Secondary | ICD-10-CM | POA: Diagnosis not present

## 2019-05-14 DIAGNOSIS — J9611 Chronic respiratory failure with hypoxia: Secondary | ICD-10-CM | POA: Diagnosis not present

## 2019-05-14 DIAGNOSIS — I5030 Unspecified diastolic (congestive) heart failure: Secondary | ICD-10-CM | POA: Diagnosis not present

## 2019-05-14 DIAGNOSIS — I110 Hypertensive heart disease with heart failure: Secondary | ICD-10-CM | POA: Diagnosis not present

## 2019-05-14 DIAGNOSIS — E119 Type 2 diabetes mellitus without complications: Secondary | ICD-10-CM | POA: Diagnosis not present

## 2019-05-14 DIAGNOSIS — J449 Chronic obstructive pulmonary disease, unspecified: Secondary | ICD-10-CM | POA: Diagnosis not present

## 2019-05-19 DIAGNOSIS — J9611 Chronic respiratory failure with hypoxia: Secondary | ICD-10-CM | POA: Diagnosis not present

## 2019-05-19 DIAGNOSIS — J9612 Chronic respiratory failure with hypercapnia: Secondary | ICD-10-CM | POA: Diagnosis not present

## 2019-05-19 DIAGNOSIS — E119 Type 2 diabetes mellitus without complications: Secondary | ICD-10-CM | POA: Diagnosis not present

## 2019-05-19 DIAGNOSIS — I5030 Unspecified diastolic (congestive) heart failure: Secondary | ICD-10-CM | POA: Diagnosis not present

## 2019-05-19 DIAGNOSIS — J449 Chronic obstructive pulmonary disease, unspecified: Secondary | ICD-10-CM | POA: Diagnosis not present

## 2019-05-19 DIAGNOSIS — I110 Hypertensive heart disease with heart failure: Secondary | ICD-10-CM | POA: Diagnosis not present

## 2019-05-21 DIAGNOSIS — J9611 Chronic respiratory failure with hypoxia: Secondary | ICD-10-CM | POA: Diagnosis not present

## 2019-05-21 DIAGNOSIS — J9612 Chronic respiratory failure with hypercapnia: Secondary | ICD-10-CM | POA: Diagnosis not present

## 2019-05-21 DIAGNOSIS — I110 Hypertensive heart disease with heart failure: Secondary | ICD-10-CM | POA: Diagnosis not present

## 2019-05-21 DIAGNOSIS — E119 Type 2 diabetes mellitus without complications: Secondary | ICD-10-CM | POA: Diagnosis not present

## 2019-05-21 DIAGNOSIS — J449 Chronic obstructive pulmonary disease, unspecified: Secondary | ICD-10-CM | POA: Diagnosis not present

## 2019-05-21 DIAGNOSIS — I5030 Unspecified diastolic (congestive) heart failure: Secondary | ICD-10-CM | POA: Diagnosis not present

## 2019-05-22 DIAGNOSIS — F329 Major depressive disorder, single episode, unspecified: Secondary | ICD-10-CM | POA: Diagnosis not present

## 2019-05-22 DIAGNOSIS — I058 Other rheumatic mitral valve diseases: Secondary | ICD-10-CM | POA: Diagnosis not present

## 2019-05-22 DIAGNOSIS — Z8541 Personal history of malignant neoplasm of cervix uteri: Secondary | ICD-10-CM | POA: Diagnosis not present

## 2019-05-22 DIAGNOSIS — E119 Type 2 diabetes mellitus without complications: Secondary | ICD-10-CM | POA: Diagnosis not present

## 2019-05-22 DIAGNOSIS — J449 Chronic obstructive pulmonary disease, unspecified: Secondary | ICD-10-CM | POA: Diagnosis not present

## 2019-05-22 DIAGNOSIS — E785 Hyperlipidemia, unspecified: Secondary | ICD-10-CM | POA: Diagnosis not present

## 2019-05-22 DIAGNOSIS — J9611 Chronic respiratory failure with hypoxia: Secondary | ICD-10-CM | POA: Diagnosis not present

## 2019-05-22 DIAGNOSIS — F17210 Nicotine dependence, cigarettes, uncomplicated: Secondary | ICD-10-CM | POA: Diagnosis not present

## 2019-05-22 DIAGNOSIS — Z9981 Dependence on supplemental oxygen: Secondary | ICD-10-CM | POA: Diagnosis not present

## 2019-05-22 DIAGNOSIS — Z794 Long term (current) use of insulin: Secondary | ICD-10-CM | POA: Diagnosis not present

## 2019-05-22 DIAGNOSIS — Z8701 Personal history of pneumonia (recurrent): Secondary | ICD-10-CM | POA: Diagnosis not present

## 2019-05-22 DIAGNOSIS — I110 Hypertensive heart disease with heart failure: Secondary | ICD-10-CM | POA: Diagnosis not present

## 2019-05-22 DIAGNOSIS — E669 Obesity, unspecified: Secondary | ICD-10-CM | POA: Diagnosis not present

## 2019-05-22 DIAGNOSIS — Z6834 Body mass index (BMI) 34.0-34.9, adult: Secondary | ICD-10-CM | POA: Diagnosis not present

## 2019-05-22 DIAGNOSIS — J9612 Chronic respiratory failure with hypercapnia: Secondary | ICD-10-CM | POA: Diagnosis not present

## 2019-05-22 DIAGNOSIS — I5030 Unspecified diastolic (congestive) heart failure: Secondary | ICD-10-CM | POA: Diagnosis not present

## 2019-05-22 DIAGNOSIS — Z7952 Long term (current) use of systemic steroids: Secondary | ICD-10-CM | POA: Diagnosis not present

## 2019-05-22 DIAGNOSIS — Z9181 History of falling: Secondary | ICD-10-CM | POA: Diagnosis not present

## 2019-05-22 DIAGNOSIS — I499 Cardiac arrhythmia, unspecified: Secondary | ICD-10-CM | POA: Diagnosis not present

## 2019-05-22 DIAGNOSIS — F411 Generalized anxiety disorder: Secondary | ICD-10-CM | POA: Diagnosis not present

## 2019-05-22 DIAGNOSIS — R222 Localized swelling, mass and lump, trunk: Secondary | ICD-10-CM | POA: Diagnosis not present

## 2019-05-25 ENCOUNTER — Other Ambulatory Visit: Payer: Self-pay | Admitting: Internal Medicine

## 2019-05-25 NOTE — Telephone Encounter (Signed)
Last OV 11/10/18 Next OV NA Last RF 04/21/19

## 2019-05-26 ENCOUNTER — Telehealth: Payer: Self-pay | Admitting: Internal Medicine

## 2019-05-26 DIAGNOSIS — J9612 Chronic respiratory failure with hypercapnia: Secondary | ICD-10-CM | POA: Diagnosis not present

## 2019-05-26 DIAGNOSIS — E119 Type 2 diabetes mellitus without complications: Secondary | ICD-10-CM | POA: Diagnosis not present

## 2019-05-26 DIAGNOSIS — I5030 Unspecified diastolic (congestive) heart failure: Secondary | ICD-10-CM | POA: Diagnosis not present

## 2019-05-26 DIAGNOSIS — J9611 Chronic respiratory failure with hypoxia: Secondary | ICD-10-CM | POA: Diagnosis not present

## 2019-05-26 DIAGNOSIS — J449 Chronic obstructive pulmonary disease, unspecified: Secondary | ICD-10-CM | POA: Diagnosis not present

## 2019-05-26 DIAGNOSIS — I110 Hypertensive heart disease with heart failure: Secondary | ICD-10-CM | POA: Diagnosis not present

## 2019-05-26 NOTE — Telephone Encounter (Signed)
    Brandon from [facility] calling to request verbal order for OT 1x week for 4 weeks Phone [phone]

## 2019-05-27 NOTE — Telephone Encounter (Signed)
Gave ok for orders.  

## 2019-05-31 DIAGNOSIS — E119 Type 2 diabetes mellitus without complications: Secondary | ICD-10-CM | POA: Diagnosis not present

## 2019-05-31 DIAGNOSIS — J9612 Chronic respiratory failure with hypercapnia: Secondary | ICD-10-CM | POA: Diagnosis not present

## 2019-05-31 DIAGNOSIS — J449 Chronic obstructive pulmonary disease, unspecified: Secondary | ICD-10-CM | POA: Diagnosis not present

## 2019-05-31 DIAGNOSIS — I5030 Unspecified diastolic (congestive) heart failure: Secondary | ICD-10-CM | POA: Diagnosis not present

## 2019-05-31 DIAGNOSIS — J9611 Chronic respiratory failure with hypoxia: Secondary | ICD-10-CM | POA: Diagnosis not present

## 2019-05-31 DIAGNOSIS — I110 Hypertensive heart disease with heart failure: Secondary | ICD-10-CM | POA: Diagnosis not present

## 2019-06-04 DIAGNOSIS — J9611 Chronic respiratory failure with hypoxia: Secondary | ICD-10-CM | POA: Diagnosis not present

## 2019-06-04 DIAGNOSIS — J9612 Chronic respiratory failure with hypercapnia: Secondary | ICD-10-CM | POA: Diagnosis not present

## 2019-06-04 DIAGNOSIS — I5030 Unspecified diastolic (congestive) heart failure: Secondary | ICD-10-CM | POA: Diagnosis not present

## 2019-06-04 DIAGNOSIS — J449 Chronic obstructive pulmonary disease, unspecified: Secondary | ICD-10-CM | POA: Diagnosis not present

## 2019-06-04 DIAGNOSIS — I110 Hypertensive heart disease with heart failure: Secondary | ICD-10-CM | POA: Diagnosis not present

## 2019-06-04 DIAGNOSIS — E119 Type 2 diabetes mellitus without complications: Secondary | ICD-10-CM | POA: Diagnosis not present

## 2019-06-07 DIAGNOSIS — I5030 Unspecified diastolic (congestive) heart failure: Secondary | ICD-10-CM | POA: Diagnosis not present

## 2019-06-07 DIAGNOSIS — E119 Type 2 diabetes mellitus without complications: Secondary | ICD-10-CM | POA: Diagnosis not present

## 2019-06-07 DIAGNOSIS — I110 Hypertensive heart disease with heart failure: Secondary | ICD-10-CM | POA: Diagnosis not present

## 2019-06-07 DIAGNOSIS — J9611 Chronic respiratory failure with hypoxia: Secondary | ICD-10-CM | POA: Diagnosis not present

## 2019-06-07 DIAGNOSIS — J449 Chronic obstructive pulmonary disease, unspecified: Secondary | ICD-10-CM | POA: Diagnosis not present

## 2019-06-07 DIAGNOSIS — J9612 Chronic respiratory failure with hypercapnia: Secondary | ICD-10-CM | POA: Diagnosis not present

## 2019-06-10 ENCOUNTER — Telehealth: Payer: Self-pay | Admitting: Internal Medicine

## 2019-06-10 NOTE — Telephone Encounter (Signed)
New message:   Pt states she has been in and out of the hospital and states she hasn't been given any instructions. She states she hasn't been called about any results from test performed either. Please advise.

## 2019-06-10 NOTE — Telephone Encounter (Signed)
I last saw her 10/2018 -- needs an appt - should be 30 min.

## 2019-06-10 NOTE — Telephone Encounter (Signed)
Do you want to try to get her in for an OV?

## 2019-06-10 NOTE — Telephone Encounter (Signed)
Called and spoke with pt who stated that she has recently been in and out of the hospital or in rehab a lot recently and was wondering if she needed to have a visit. Stated to her that we should schedule her a f/u and let her know that I could get her scheduled for an appt and she verbalized understanding. Pt has been scheduled for a HFU with Aaron Edelman 5/25 as pt requested it to be a televisit. Nothing further needed.

## 2019-06-11 NOTE — Telephone Encounter (Signed)
Tried to call pt in regards. She acted like she could not hear me and asked if I could call her back later. I asked if she was ok and she stated she was fine. I will try back later.

## 2019-06-14 DIAGNOSIS — J9612 Chronic respiratory failure with hypercapnia: Secondary | ICD-10-CM | POA: Diagnosis not present

## 2019-06-14 DIAGNOSIS — J9611 Chronic respiratory failure with hypoxia: Secondary | ICD-10-CM | POA: Diagnosis not present

## 2019-06-14 DIAGNOSIS — I110 Hypertensive heart disease with heart failure: Secondary | ICD-10-CM | POA: Diagnosis not present

## 2019-06-14 DIAGNOSIS — I5030 Unspecified diastolic (congestive) heart failure: Secondary | ICD-10-CM | POA: Diagnosis not present

## 2019-06-14 DIAGNOSIS — J449 Chronic obstructive pulmonary disease, unspecified: Secondary | ICD-10-CM | POA: Diagnosis not present

## 2019-06-14 DIAGNOSIS — E119 Type 2 diabetes mellitus without complications: Secondary | ICD-10-CM | POA: Diagnosis not present

## 2019-06-15 ENCOUNTER — Other Ambulatory Visit: Payer: Self-pay

## 2019-06-15 ENCOUNTER — Encounter: Payer: Self-pay | Admitting: Pulmonary Disease

## 2019-06-15 ENCOUNTER — Ambulatory Visit (INDEPENDENT_AMBULATORY_CARE_PROVIDER_SITE_OTHER): Payer: Medicare Other | Admitting: Pulmonary Disease

## 2019-06-15 DIAGNOSIS — R404 Transient alteration of awareness: Secondary | ICD-10-CM | POA: Diagnosis not present

## 2019-06-15 DIAGNOSIS — J9611 Chronic respiratory failure with hypoxia: Secondary | ICD-10-CM

## 2019-06-15 DIAGNOSIS — J441 Chronic obstructive pulmonary disease with (acute) exacerbation: Secondary | ICD-10-CM

## 2019-06-15 DIAGNOSIS — J9612 Chronic respiratory failure with hypercapnia: Secondary | ICD-10-CM

## 2019-06-15 DIAGNOSIS — T300 Burn of unspecified body region, unspecified degree: Secondary | ICD-10-CM | POA: Diagnosis not present

## 2019-06-15 DIAGNOSIS — R5381 Other malaise: Secondary | ICD-10-CM

## 2019-06-15 DIAGNOSIS — Z720 Tobacco use: Secondary | ICD-10-CM

## 2019-06-15 DIAGNOSIS — I469 Cardiac arrest, cause unspecified: Secondary | ICD-10-CM | POA: Diagnosis not present

## 2019-06-16 ENCOUNTER — Telehealth: Payer: Self-pay | Admitting: Emergency Medicine

## 2019-06-16 ENCOUNTER — Telehealth: Payer: Self-pay | Admitting: Pulmonary Disease

## 2019-06-16 NOTE — Telephone Encounter (Signed)
Condolence card has been obtained. Have changed pt's status in demographics as well. Nothing further needed.

## 2019-06-16 NOTE — Telephone Encounter (Signed)
06/16/2019  Received notification from primary care that patient passed away over the weekend.  Can we get a condolence card set up?  We will route to [facility], CMA to coordinate and route to Dr. [name] as Juluis Rainier.  Wyn Quaker, FNP

## 2019-06-16 NOTE — Telephone Encounter (Signed)
[facility] called yesterday Jun 26, 2019 around 2 pm to state caretaker found patient at home and had  passed away. Paramedic stated they were out at the house twice over the weekend and patient was having breathing issues but refused AMA to go to the hospital both times. Spoke to providers CMA who stated Dr Quay Burow would be attending to sign death certificate.

## 2019-06-17 ENCOUNTER — Telehealth: Payer: Self-pay | Admitting: Internal Medicine

## 2019-06-17 NOTE — Telephone Encounter (Signed)
Gave ok

## 2019-06-17 NOTE — Telephone Encounter (Signed)
ok 

## 2019-06-17 NOTE — Telephone Encounter (Signed)
  [name] from [employer] [facility] calling to request Dr Quay Burow sign death certificate Phone [phone] Patient expired 05/25 at home

## 2019-06-18 NOTE — Telephone Encounter (Signed)
Form signed and returned back to up front.

## 2019-06-18 NOTE — Telephone Encounter (Signed)
Faxed to [phone], Also called [phone] for pick up as requested.

## 2019-06-18 NOTE — Telephone Encounter (Signed)
[employer] have dropped off a death certificate to be signed.  Placed in providers box to sign.

## 2019-06-22 DIAGNOSIS — 419620001 Death: Secondary | SNOMED CT | POA: Diagnosis not present

## 2019-06-22 NOTE — Patient Instructions (Signed)
You were scheduled for a televisit today.  We made multiple attempts to reach you.  2 voicemails were left.    Please contact our office and reschedule a appointment.  Please also contact your primary care provider and reschedule an appointment.  Wyn Quaker, FNP

## 2019-06-22 NOTE — Telephone Encounter (Signed)
LVM with suggestion below.

## 2019-06-22 NOTE — Progress Notes (Signed)
Virtual Visit via Telephone Note  I was unable to reach the patient.  3 attempts were made.  2 voicemails were left.  Location: Patient: Home Provider: [facility] - [id number] [place], [place], [place] [address]   I discussed the limitations, risks, security and privacy concerns of performing an evaluation and management service by telephone and the availability of in person appointments. I also discussed with the patient that there may be a patient responsible charge related to this service. The patient expressed understanding and agreed to proceed.  Patient consented to consult via telephone: Yes People present and their role in pt care: Pt     History of Present Illness:  74 year old female current every day smoker followed in our office for COPD and chronic respiratory failure  PMH: Hyperlipidemia, anxiety, diabetes, fatigue, gerd, pulmonary htn,  Smoking Assessment: Current everyday smoker. 0.25 ppd. 12.5 pack years.  Maintenance: Trelegy Ellipta, 20 mg of prednisone daily Pt of Dr. Purnell Shoemaker   Chief complaint: Hospital follow-up  74 year old female current everyday smoker upon our office for COPD and chronic respiratory failure.  Patient has recently been hospitalized multiple times as well as in a rehab center.  She last completed a virtual visit in December/2020 with [facility] NP.  Patient would qualify for hospice for management of her respiratory disease but patient is reluctant to establish with hospice because this would cause her to lose her caregiver/aide which she currently utilizes daily.  Patient has also contacted primary care who she has not seen since October/2020.  Last hospitalization on file is from January/2021 when patient was discharged on 02/24/2019.  Discharge summary list she was sent to [facility].  Patient refused rehab.  Patient refuses palliative care and hospice.  Patient is fully aware that she gets hypoxic easily and she could  die in her home alone.  Patient is a DO NOT RESUSCITATE.    06/23/19 - [id number] -left vm  2019/06/23 1346 - left vm  06/23/2019 - 1401 - called   Observations/Objective:  08/27/2018-echocardiogram- LV ejection fraction 60 to 65%, increased left ventricular wall thickness, impaired relaxation, RV has normal systolic function, cavity was normal, mild diastolic dysfunction  [id number] function test- FVC 1.46 (64% predicted), FEV1 0.69 (39% predicted), ratio 47  10/30/2016-CT chest with contrast- stable appearance of mediastinal adenopathy, bilateral hilar adenopathy is identified, small nonspecific pulmonary nodules are unchanged from previous exam, emphysema  Social History   Tobacco Use  Smoking Status Current Every Day Smoker  . Packs/day: 0.25  . Years: 50.00  . Pack years: 12.50  . Types: Cigarettes  Smokeless Tobacco Never Used  Tobacco Comment   3-4 cigaretters per day 11/20/2017    Immunization History  Administered Date(s) Administered  . Fluad Quad(high Dose 65+) 11/05/2018  . Influenza Split 11/12/2010  . Influenza, High Dose Seasonal PF 11/09/2012, 10/31/2015, 11/20/2017  . Influenza,inj,Quad PF,6+ Mos 10/20/2014  . Pneumococcal Conjugate-13 05/17/2013  . Pneumococcal Polysaccharide-23 03/22/2010, 06/24/2016      Assessment and Plan:  No problem-specific Assessment & Plan notes found for this encounter.   Follow Up Instructions:  No follow-ups on file.   I discussed the assessment and treatment plan with the patient. The patient was provided an opportunity to ask questions and all were answered. The patient agreed with the plan and demonstrated an understanding of the instructions.   The patient was advised to call back or seek an in-person evaluation if the symptoms worsen or if the condition fails  to improve as anticipated.  I provided 0 minutes of non-face-to-face time during this encounter.   Lauraine Rinne, NP

## 2019-06-22 DEATH — deceased

## 2019-06-24 ENCOUNTER — Other Ambulatory Visit: Payer: Self-pay | Admitting: Internal Medicine

## 2019-08-30 ENCOUNTER — Ambulatory Visit: Payer: Self-pay
